# Patient Record
Sex: Male | Born: 1960 | Race: Black or African American | Hispanic: No | Marital: Single | Smoking: Current every day smoker
Health system: Southern US, Community
[De-identification: ages and names within clinical notes are randomized; demographics above are authoritative.]

## PROBLEM LIST (undated history)

## (undated) DIAGNOSIS — I96 Gangrene, not elsewhere classified: Secondary | ICD-10-CM

## (undated) DIAGNOSIS — I70262 Atherosclerosis of native arteries of extremities with gangrene, left leg: Secondary | ICD-10-CM

## (undated) DIAGNOSIS — G709 Myoneural disorder, unspecified: Secondary | ICD-10-CM

## (undated) DIAGNOSIS — Z992 Dependence on renal dialysis: Secondary | ICD-10-CM

## (undated) DIAGNOSIS — I739 Peripheral vascular disease, unspecified: Secondary | ICD-10-CM

## (undated) DIAGNOSIS — J189 Pneumonia, unspecified organism: Secondary | ICD-10-CM

## (undated) DIAGNOSIS — I5020 Unspecified systolic (congestive) heart failure: Secondary | ICD-10-CM

## (undated) DIAGNOSIS — K661 Hemoperitoneum: Secondary | ICD-10-CM

## (undated) DIAGNOSIS — I10 Essential (primary) hypertension: Secondary | ICD-10-CM

## (undated) DIAGNOSIS — M869 Osteomyelitis, unspecified: Secondary | ICD-10-CM

## (undated) DIAGNOSIS — Z95820 Peripheral vascular angioplasty status with implants and grafts: Secondary | ICD-10-CM

## (undated) DIAGNOSIS — N186 End stage renal disease: Secondary | ICD-10-CM

## (undated) DIAGNOSIS — K219 Gastro-esophageal reflux disease without esophagitis: Secondary | ICD-10-CM

## (undated) DIAGNOSIS — I998 Other disorder of circulatory system: Secondary | ICD-10-CM

## (undated) DIAGNOSIS — I509 Heart failure, unspecified: Secondary | ICD-10-CM

## (undated) DIAGNOSIS — E1151 Type 2 diabetes mellitus with diabetic peripheral angiopathy without gangrene: Secondary | ICD-10-CM

## (undated) DIAGNOSIS — K683 Retroperitoneal hematoma: Secondary | ICD-10-CM

## (undated) DIAGNOSIS — R0602 Shortness of breath: Secondary | ICD-10-CM

## (undated) DIAGNOSIS — E785 Hyperlipidemia, unspecified: Secondary | ICD-10-CM

## (undated) DIAGNOSIS — E119 Type 2 diabetes mellitus without complications: Secondary | ICD-10-CM

## (undated) HISTORY — PX: KNEE ARTHROSCOPY: SUR90

## (undated) HISTORY — PX: SKIN GRAFT: SHX250

## (undated) HISTORY — DX: Essential (primary) hypertension: I10

## (undated) HISTORY — DX: Osteomyelitis, unspecified: M86.9

## (undated) HISTORY — DX: Type 2 diabetes mellitus with diabetic peripheral angiopathy without gangrene: E11.51

## (undated) HISTORY — DX: Atherosclerosis of native arteries of extremities with gangrene, left leg: I70.262

## (undated) HISTORY — DX: Unspecified systolic (congestive) heart failure: I50.20

## (undated) HISTORY — DX: End stage renal disease: Z99.2

## (undated) HISTORY — DX: End stage renal disease: N18.6

## (undated) HISTORY — DX: Shortness of breath: R06.02

## (undated) HISTORY — DX: Hyperlipidemia, unspecified: E78.5

---
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
## 1999-06-17 ENCOUNTER — Emergency Department (HOSPITAL_COMMUNITY): Admission: EM | Admit: 1999-06-17 | Discharge: 1999-06-17 | Payer: Self-pay | Admitting: Emergency Medicine

## 1999-06-17 ENCOUNTER — Encounter: Payer: Self-pay | Admitting: Emergency Medicine

## 1999-08-02 ENCOUNTER — Emergency Department (HOSPITAL_COMMUNITY): Admission: EM | Admit: 1999-08-02 | Discharge: 1999-08-02 | Payer: Self-pay | Admitting: Emergency Medicine

## 1999-08-03 ENCOUNTER — Encounter: Payer: Self-pay | Admitting: Emergency Medicine

## 2000-01-17 ENCOUNTER — Emergency Department (HOSPITAL_COMMUNITY): Admission: EM | Admit: 2000-01-17 | Discharge: 2000-01-17 | Payer: Self-pay | Admitting: *Deleted

## 2000-01-17 ENCOUNTER — Encounter: Payer: Self-pay | Admitting: Emergency Medicine

## 2008-01-05 DIAGNOSIS — J189 Pneumonia, unspecified organism: Secondary | ICD-10-CM

## 2008-01-05 DIAGNOSIS — M869 Osteomyelitis, unspecified: Secondary | ICD-10-CM

## 2008-01-05 HISTORY — DX: Pneumonia, unspecified organism: J18.9

## 2008-01-05 HISTORY — DX: Osteomyelitis, unspecified: M86.9

## 2008-02-05 HISTORY — PX: TOE AMPUTATION: SHX809

## 2008-02-26 ENCOUNTER — Inpatient Hospital Stay (HOSPITAL_COMMUNITY): Admission: EM | Admit: 2008-02-26 | Discharge: 2008-03-01 | Payer: Self-pay | Admitting: Emergency Medicine

## 2008-02-27 ENCOUNTER — Ambulatory Visit: Payer: Self-pay | Admitting: Surgery

## 2008-02-27 ENCOUNTER — Encounter (INDEPENDENT_AMBULATORY_CARE_PROVIDER_SITE_OTHER): Payer: Self-pay | Admitting: Internal Medicine

## 2008-02-28 ENCOUNTER — Encounter (INDEPENDENT_AMBULATORY_CARE_PROVIDER_SITE_OTHER): Payer: Self-pay | Admitting: Orthopedic Surgery

## 2008-02-29 ENCOUNTER — Ambulatory Visit: Payer: Self-pay | Admitting: Infectious Diseases

## 2008-03-06 DIAGNOSIS — I10 Essential (primary) hypertension: Secondary | ICD-10-CM | POA: Insufficient documentation

## 2008-03-06 DIAGNOSIS — E119 Type 2 diabetes mellitus without complications: Secondary | ICD-10-CM | POA: Insufficient documentation

## 2008-03-06 DIAGNOSIS — E118 Type 2 diabetes mellitus with unspecified complications: Secondary | ICD-10-CM

## 2008-03-06 DIAGNOSIS — E1165 Type 2 diabetes mellitus with hyperglycemia: Secondary | ICD-10-CM | POA: Insufficient documentation

## 2008-03-06 DIAGNOSIS — E785 Hyperlipidemia, unspecified: Secondary | ICD-10-CM | POA: Insufficient documentation

## 2008-03-15 ENCOUNTER — Encounter (HOSPITAL_BASED_OUTPATIENT_CLINIC_OR_DEPARTMENT_OTHER): Admission: RE | Admit: 2008-03-15 | Discharge: 2008-06-13 | Payer: Self-pay | Admitting: General Surgery

## 2008-03-29 ENCOUNTER — Encounter: Payer: Self-pay | Admitting: Infectious Diseases

## 2008-04-03 ENCOUNTER — Encounter: Payer: Self-pay | Admitting: Infectious Diseases

## 2008-04-04 ENCOUNTER — Telehealth (INDEPENDENT_AMBULATORY_CARE_PROVIDER_SITE_OTHER): Payer: Self-pay | Admitting: *Deleted

## 2008-04-04 ENCOUNTER — Ambulatory Visit: Payer: Self-pay | Admitting: Infectious Diseases

## 2008-05-07 ENCOUNTER — Encounter: Payer: Self-pay | Admitting: Infectious Diseases

## 2008-05-13 ENCOUNTER — Ambulatory Visit: Payer: Self-pay | Admitting: *Deleted

## 2008-05-14 ENCOUNTER — Encounter: Payer: Self-pay | Admitting: Internal Medicine

## 2008-05-14 ENCOUNTER — Ambulatory Visit: Payer: Self-pay | Admitting: Internal Medicine

## 2008-05-14 LAB — CONVERTED CEMR LAB
Creatinine, Ser: 0.95 mg/dL (ref 0.40–1.50)
GFR calc Af Amer: >60 mL/min (ref >60)
GFR calc non Af Amer: >60 mL/min (ref >60)
Sodium: 135 meq/L (ref 135–145)

## 2008-05-23 ENCOUNTER — Ambulatory Visit: Payer: Self-pay | Admitting: Infectious Diseases

## 2008-06-05 ENCOUNTER — Ambulatory Visit: Payer: Self-pay | Admitting: *Deleted

## 2008-06-05 ENCOUNTER — Inpatient Hospital Stay (HOSPITAL_COMMUNITY): Admission: AD | Admit: 2008-06-05 | Discharge: 2008-06-06 | Payer: Self-pay | Admitting: *Deleted

## 2008-06-05 ENCOUNTER — Ambulatory Visit: Payer: Self-pay | Admitting: Internal Medicine

## 2008-06-05 LAB — CONVERTED CEMR LAB: Blood Glucose, Fingerstick: 240

## 2008-06-06 ENCOUNTER — Encounter (INDEPENDENT_AMBULATORY_CARE_PROVIDER_SITE_OTHER): Payer: Self-pay | Admitting: *Deleted

## 2008-06-06 ENCOUNTER — Ambulatory Visit: Payer: Self-pay | Admitting: Vascular Surgery

## 2008-06-06 ENCOUNTER — Encounter (INDEPENDENT_AMBULATORY_CARE_PROVIDER_SITE_OTHER): Payer: Self-pay | Admitting: Internal Medicine

## 2008-06-08 ENCOUNTER — Telehealth: Payer: Self-pay | Admitting: Internal Medicine

## 2008-06-12 ENCOUNTER — Encounter (INDEPENDENT_AMBULATORY_CARE_PROVIDER_SITE_OTHER): Payer: Self-pay | Admitting: Internal Medicine

## 2008-06-12 ENCOUNTER — Telehealth (INDEPENDENT_AMBULATORY_CARE_PROVIDER_SITE_OTHER): Payer: Self-pay | Admitting: Internal Medicine

## 2008-06-13 ENCOUNTER — Encounter (HOSPITAL_BASED_OUTPATIENT_CLINIC_OR_DEPARTMENT_OTHER): Admission: RE | Admit: 2008-06-13 | Discharge: 2008-09-11 | Payer: Self-pay | Admitting: General Surgery

## 2008-06-13 ENCOUNTER — Encounter: Payer: Self-pay | Admitting: Internal Medicine

## 2008-06-20 ENCOUNTER — Encounter: Payer: Self-pay | Admitting: Infectious Diseases

## 2008-07-11 ENCOUNTER — Ambulatory Visit: Payer: Self-pay | Admitting: Internal Medicine

## 2008-07-12 ENCOUNTER — Ambulatory Visit: Payer: Self-pay | Admitting: Internal Medicine

## 2008-07-12 ENCOUNTER — Encounter (INDEPENDENT_AMBULATORY_CARE_PROVIDER_SITE_OTHER): Payer: Self-pay | Admitting: Internal Medicine

## 2008-07-12 DIAGNOSIS — H538 Other visual disturbances: Secondary | ICD-10-CM | POA: Insufficient documentation

## 2008-07-12 LAB — CONVERTED CEMR LAB: Blood Glucose, Fingerstick: 206

## 2008-07-13 ENCOUNTER — Encounter (INDEPENDENT_AMBULATORY_CARE_PROVIDER_SITE_OTHER): Payer: Self-pay | Admitting: Internal Medicine

## 2008-07-17 ENCOUNTER — Encounter: Payer: Self-pay | Admitting: Internal Medicine

## 2008-07-17 ENCOUNTER — Ambulatory Visit: Payer: Self-pay | Admitting: Infectious Diseases

## 2008-07-17 LAB — CONVERTED CEMR LAB
BUN: 20 mg/dL (ref 6–23)
Basophils Absolute: 0 10*3/uL (ref 0.0–0.1)
Basophils Relative: 0 % (ref 0–1)
Calcium: 10.2 mg/dL (ref 8.4–10.5)
Calcium: 9.8 mg/dL (ref 8.4–10.5)
Chloride: 103 meq/L (ref 96–112)
Creatinine, Ser: 0.92 mg/dL (ref 0.40–1.50)
Eosinophils Absolute: 0 10*3/uL (ref 0.0–0.7)
Eosinophils Relative: 0 % (ref 0–5)
Glucose, Bld: 136 mg/dL — ABNORMAL HIGH (ref 70–99)
HCT: 38.2 % — ABNORMAL LOW (ref 39.0–52.0)
Lymphocytes Relative: 26 % (ref 12–46)
Lymphs Abs: 2.2 10*3/uL (ref 0.7–4.0)
Monocytes Absolute: 0.6 10*3/uL (ref 0.1–1.0)
Monocytes Relative: 7 % (ref 3–12)
Neutro Abs: 5.9 10*3/uL (ref 1.7–7.7)
Neutrophils Relative %: 67 % (ref 43–77)
RBC: 4.4 M/uL (ref 4.22–5.81)
WBC: 8.7 10*3/uL (ref 4.0–10.5)

## 2008-07-29 ENCOUNTER — Encounter: Payer: Self-pay | Admitting: Infectious Diseases

## 2008-09-11 ENCOUNTER — Encounter (HOSPITAL_BASED_OUTPATIENT_CLINIC_OR_DEPARTMENT_OTHER): Admission: RE | Admit: 2008-09-11 | Discharge: 2008-12-05 | Payer: Self-pay | Admitting: Internal Medicine

## 2009-03-20 ENCOUNTER — Inpatient Hospital Stay (HOSPITAL_COMMUNITY): Admission: EM | Admit: 2009-03-20 | Discharge: 2009-03-25 | Payer: Self-pay | Admitting: Emergency Medicine

## 2009-03-20 ENCOUNTER — Ambulatory Visit: Payer: Self-pay | Admitting: Cardiology

## 2009-03-20 ENCOUNTER — Ambulatory Visit: Payer: Self-pay | Admitting: Internal Medicine

## 2009-03-21 ENCOUNTER — Encounter: Payer: Self-pay | Admitting: Internal Medicine

## 2009-03-23 ENCOUNTER — Encounter: Payer: Self-pay | Admitting: Internal Medicine

## 2009-04-10 ENCOUNTER — Ambulatory Visit: Payer: Self-pay | Admitting: Internal Medicine

## 2009-04-10 ENCOUNTER — Telehealth (INDEPENDENT_AMBULATORY_CARE_PROVIDER_SITE_OTHER): Payer: Self-pay | Admitting: *Deleted

## 2009-04-14 ENCOUNTER — Encounter: Payer: Self-pay | Admitting: Internal Medicine

## 2009-07-28 ENCOUNTER — Ambulatory Visit: Payer: Self-pay | Admitting: Internal Medicine

## 2009-07-28 ENCOUNTER — Encounter: Payer: Self-pay | Admitting: Internal Medicine

## 2009-07-28 ENCOUNTER — Encounter (INDEPENDENT_AMBULATORY_CARE_PROVIDER_SITE_OTHER): Payer: Self-pay | Admitting: *Deleted

## 2009-07-28 ENCOUNTER — Observation Stay (HOSPITAL_COMMUNITY): Admission: RE | Admit: 2009-07-28 | Discharge: 2009-07-29 | Payer: Self-pay | Admitting: Internal Medicine

## 2009-07-28 LAB — CONVERTED CEMR LAB
HDL: 26 mg/dL
Hgb A1c MFr Bld: 13 %
Triglycerides: 157 mg/dL

## 2009-07-28 LAB — HM DIABETES FOOT EXAM

## 2009-07-29 ENCOUNTER — Encounter: Payer: Self-pay | Admitting: Internal Medicine

## 2009-07-29 ENCOUNTER — Encounter (INDEPENDENT_AMBULATORY_CARE_PROVIDER_SITE_OTHER): Payer: Self-pay | Admitting: *Deleted

## 2009-08-04 ENCOUNTER — Encounter (HOSPITAL_BASED_OUTPATIENT_CLINIC_OR_DEPARTMENT_OTHER): Admission: RE | Admit: 2009-08-04 | Discharge: 2009-10-31 | Payer: Self-pay | Admitting: General Surgery

## 2009-08-04 ENCOUNTER — Telehealth: Payer: Self-pay | Admitting: Internal Medicine

## 2009-08-04 ENCOUNTER — Encounter: Payer: Self-pay | Admitting: Licensed Clinical Social Worker

## 2009-08-08 ENCOUNTER — Ambulatory Visit (HOSPITAL_COMMUNITY): Admission: RE | Admit: 2009-08-08 | Discharge: 2009-08-08 | Payer: Self-pay | Admitting: General Surgery

## 2009-08-13 ENCOUNTER — Telehealth: Payer: Self-pay | Admitting: *Deleted

## 2009-09-11 ENCOUNTER — Encounter: Payer: Self-pay | Admitting: Internal Medicine

## 2009-11-05 ENCOUNTER — Encounter (HOSPITAL_BASED_OUTPATIENT_CLINIC_OR_DEPARTMENT_OTHER)
Admission: RE | Admit: 2009-11-05 | Discharge: 2010-02-03 | Payer: Self-pay | Source: Home / Self Care | Attending: General Surgery | Admitting: General Surgery

## 2010-02-03 NOTE — Progress Notes (Signed)
Summary: phone/gg  Phone Note Call from Patient   Caller: Patient Summary of Call: Pt called and states [employer] [employer] would be calling us about his short term disability.  He gives his permission to talk with them. Initial call taken by: Gevena Cotton RN,  August 13, 2009 3:44 PM

## 2010-02-03 NOTE — Miscellaneous (Signed)
Summary: [employer]  [employer]   Imported By: Garlan Fillers 09/18/2009 11:03:47  _____________________________________________________________________  External Attachment:    Type:   Image     Comment:   External Document  Appended Document: [employer]/ Will fax them the required information.

## 2010-02-03 NOTE — Letter (Signed)
Summary: [facility]  [facility]   Imported By: Garlan Fillers 04/14/2009 10:01:13  _____________________________________________________________________  External Attachment:    Type:   Image     Comment:   External Document

## 2010-02-03 NOTE — Assessment & Plan Note (Signed)
Summary: ACUTE/YANG/LEFT FOOT PAIN/CH   Vital Signs:  Patient profile:   50 year old male Height:      72 inches Weight:      181.5 pounds BMI:     24.70 Temp:     99.0 degrees F oral Pulse rate:   108 / minute BP sitting:   153 / 87  (right arm)  Vitals Entered By: Silverio Decamp NT II (July 28, 2009 10:47 AM) CC: Foot pain has hole  Is Patient Diabetic? Yes Did you bring your meter with you today? Yes Pain Assessment Patient in pain? yes     Location: Left foot  Intensity: 3 Type: aching Onset of pain  Last week Nutritional Status BMI of 19 -24 = normal  Have you ever been in a relationship where you felt threatened, hurt or afraid?No   Does patient need assistance? Functional Status Self care Ambulation Normal   Diabetic Foot Exam Foot Inspection Is there a history of a foot ulcer?              Yes Is there a foot ulcer now?              Yes Can the patient see the bottom of their feet?          Yes Are the shoes appropriate in style and fit?          Yes Is there swelling or an abnormal foot shape?          No Are the toenails long?                Yes Are the toenails thick?                No Are the toenails ingrown?              No Is there heavy callous build-up?              Yes Is there pain in the calf muscle (Intermittent claudication) when walking?    NoIs there a claw toe deformity?              No Is there elevated skin temperature?            No Is there limited ankle dorsiflexion?            No Is there foot or ankle muscle weakness?            No  Diabetic Foot Care Education Patient educated on appropriate care of diabetic feet.  Pulse Check          Right Foot          Left Foot Posterior Tibial:        normal            normal Dorsalis Pedis:        normal            normal Comments: Heavy buildup of dry skin on right foot High Risk Feet? Yes Set Next Diabetic Foot Exam here: 10/28/2009   10-g (5.07) Semmes-Weinstein Monofilament  Test Performed by: Silverio Decamp NT II          Right Foot          Left Foot Site 1         normal         normal Site 2         normal  normal Site 3         normal         normal Site 4         normal         normal Site 5         normal         normal Site 6         normal         normal Site 7         normal         normal Site 8         normal         normal Site 9         normal         normal  Impression      normal         normal   Primary Care Provider:  Geanie Kenning MD  CC:  Foot pain has hole .  History of Present Illness: Patient is a 50 year old man who presents today with complaint of left foot pain.  He has a history of Left great toe amputation in 2/10 and previous history of left foot ulcers.  He states that he is on his feet a lot for work and has been having problems with the base of his amputated great toe.  There has been a small hole there which he has been putting "cream" on that he got from wound care and keeping it bandaged.  He noticed a blister last week and then Friday the blister broke and the foot swelled up. When he removed the bandage "the skin just fell off."  Once the skin came off the pain got better and the swelling decreased.  He denies fever, chills, nausea, vomiting, or diarrhea.  He has noticed that his blood sugars have been much higher then previously.    Preventive Screening-Counseling & Management  Alcohol-Tobacco     Alcohol drinks/day: occasioanlly     Smoking Status: quit     Year Quit: 2008     Pack years: 2/3   Caffeine-Diet-Exercise     Caffeine use/day: yes     Does Patient Exercise: yes     Type of exercise: walking     Exercise (avg: min/session): >60     Times/week: 7  Current Medications (verified): 1)  Aspirin Adult Low Strength 81 Mg Tbec (Aspirin) .... Take 1 Tablet By Mouth Once A Day 2)  Lantus 100 Unit/ml Soln (Insulin Glargine) .... Inject 30 Units Subcutaneously Hs or As Instructed 3)  Glucophage 500 Mg  Tabs (Metformin Hcl) .... Take 1 Tablet By Mouth Two Times A Day 4)  Lisinopril 40 Mg Tabs (Lisinopril) .... Take 1 Tablet By Mouth Once A Day 5)  Metoprolol Succinate 100 Mg Xr24h-Tab (Metoprolol Succinate) .... Take 1 Tablet By Mouth Once A Day 6)  Novolog Penfill 100 Unit/ml Soln (Insulin Aspart) .... Use Per Sliding Scale Protocol.call  Our Clinic With Any Questions. 7)  Pravastatin Sodium 40 Mg Tabs (Pravastatin Sodium) .... Take 1 Tablet By Mouth Once A Day 8)  Mucinex Dm 30-600 Mg Xr12h-Tab (Dextromethorphan-Guaifenesin) .... Take 1 Tablet By Mouth Two Times A Day As Needed For Cough and Congestion 9)  Freestyle Lite Test  Strp (Glucose Blood) .... Use To Checkblood Sugar 4x Daily 10)  Lancets  Misc (Lancets) .... Use To Check Blood Sugar 4x Daily- Please Disspense 30 Gauge or Higher  Per Patient Preference.  Allergies (verified): No Known Drug Allergies  Past History:  Past medical, surgical, family and social histories (including risk factors) reviewed for relevance to current acute and chronic problems.  Past Medical History: Reviewed history from 03/06/2008 and no changes required. Osteomyelitis, left great toe, SP amputation Diabetes mellitus, type II Hyperlipidemia Hypertension  Past Surgical History: Reviewed history from 07/11/2008 and no changes required. left great toe amputation due to osteomylitis in 02/2008.  Family History: Reviewed history from 05/13/2008 and no changes required.  Mother and father living and well.  One sister and one brother, both living and well.    Social History: Reviewed history from 05/13/2008 and no changes required. Former Research scientist (life sciences) and quitted in 2008. Alcohol use-occasional Drug use-no Regular exercise-yes  Review of Systems      See HPI  Physical Exam  Additional Exam:  General: Well developed, male in no acute distress  Resp: Clear to ascultation bilaterally, no wheezes, rales, or rhonchi noted  CV: Regular rate and rhythm  with no murmurs, rubs, or gallops noted  Foot:  right foot has no erythema or warmth and the skin is intact.  Left foot is swollen compared to the right and is darker in appearance to above the ankle.  It is warm to the touch to that level as well.  The base of the left great toe ampuation has an open sore that extends the full thickness.  There is exposed tissue that appears to be tendon.  I don't feel any bone at the base of the ulcer.  It is approximately 2 inches in diameter.  There is no purulent drainage or odor at this time. It is not painful to palpation.    Pulses: Dorsal pedis and posterior tibial pulses +2 and symmetric  Diabetes Management Exam:    Foot Exam (with socks and/or shoes not present):       Sensory-Monofilament:          Left foot: normal          Right foot: normal  Report called to Nurse on 5000.  Pt transported via wheelchair to [id number]. Sander Nephew, RN July 28, 2009 1:35 PM .  Impression & Recommendations:  Problem # 1:  DIABETIC FOOT ULCER, LEFT (ICD-707.14)  The ulcer is over a point of pressure in his foot.  There is swelling and warmth which concerns me for cellulitis associated with the ulceration.  We will go ahead and admit him for IV antibiotic therapy and get him organized with wound care to help this wound heal.  He is hemodynamically stable at this time.  Patient is discussed with Dr. Wendee Beavers who is admitting for Dr. Caroline More service and he would like an X-ray of the foot to rule out osteomyelitis.  Orders: T-DG Foot Complete*L* [id number])  Problem # 2:  CELLULITIS, FOOT, LEFT (ICD-682.7)  Associated with the diabetic foot ulcer on the left foot.  Orders: T-DG Foot Complete*L* [id number])  Complete Medication List: 1)  Aspirin Adult Low Strength 81 Mg Tbec (Aspirin) .... Take 1 tablet by mouth once a day 2)  Lantus 100 Unit/ml Soln (Insulin glargine) .... Inject 30 units subcutaneously hs or as instructed 3)  Glucophage 500 Mg Tabs (Metformin hcl) .... Take  1 tablet by mouth two times a day 4)  Lisinopril 40 Mg Tabs (Lisinopril) .... Take 1 tablet by mouth once a day 5)  Metoprolol Succinate 100 Mg Xr24h-tab (Metoprolol succinate) .... Take 1 tablet by mouth once a day  6)  Novolog Penfill 100 Unit/ml Soln (Insulin aspart) .... Use per sliding scale protocol.call  our clinic with any questions. 7)  Pravastatin Sodium 40 Mg Tabs (Pravastatin sodium) .... Take 1 tablet by mouth once a day 8)  Mucinex Dm 30-600 Mg Xr12h-tab (Dextromethorphan-guaifenesin) .... Take 1 tablet by mouth two times a day as needed for cough and congestion 9)  Freestyle Lite Test Strp (Glucose blood) .... Use to checkblood sugar 4x daily 10)  Lancets Misc (Lancets) .... Use to check blood sugar 4x daily- please disspense 30 gauge or higher per patient preference.  Other Orders: T-Hgb A1C (in-house) [phone]) T- Capillary Blood Glucose [id number])  Prevention & Chronic Care Immunizations   Influenza vaccine: Not documented   Influenza vaccine due: 09/04/2008    Tetanus booster: Not documented   Td booster deferral: Deferred  (07/11/2008)    Pneumococcal vaccine: Not documented  Other Screening   PSA: Not documented   Smoking status: quit  (07/28/2009)  Diabetes Mellitus   HgbA1C: 13.0  (07/28/2009)   HgbA1C action/deferral: Ordered  (07/28/2009)    Eye exam: Not documented   Diabetic eye exam action/deferral: Ophthalmology referral  (04/10/2009)    Foot exam: yes  (07/28/2009)   Foot exam action/deferral: Do today   High risk foot: Yes  (07/28/2009)   Foot care education: Done  (07/28/2009)   Foot exam due: 10/28/2009    Urine microalbumin/creatinine ratio: Not documented   Urine microalbumin action/deferral: Ordered  Lipids   Total Cholesterol: Not documented   Lipid panel action/deferral: Deferred   LDL: Not documented   LDL Direct: Not documented   HDL: Not documented   Triglycerides: Not documented    SGOT (AST): 12  (05/14/2008)   SGPT (ALT):  22  (05/14/2008)   Alkaline phosphatase: 68  (05/14/2008)   Total bilirubin: 0.4  (05/14/2008)  Hypertension   Last Blood Pressure: 153 / 87  (07/28/2009)   Serum creatinine: 0.92  (07/17/2008)   Serum potassium 4.3  (07/17/2008)  Self-Management Support :   Personal Goals (by the next clinic visit) :     Personal A1C goal: 7  (04/10/2009)     Personal blood pressure goal: 130/80  (04/10/2009)     Personal LDL goal: 100  (04/10/2009)    Patient will work on the following items until the next clinic visit to reach self-care goals:     Medications and monitoring: take my medicines every day, check my blood sugar  (07/28/2009)     Eating: eat more vegetables, use fresh or frozen vegetables, eat foods that are low in salt, eat baked foods instead of fried foods  (07/28/2009)     Activity: take a 30 minute walk every day  (07/28/2009)    Diabetes self-management support: Written self-care plan  (04/10/2009)    Hypertension self-management support: Written self-care plan  (04/10/2009)    Lipid self-management support: Written self-care plan  (04/10/2009)    Nursing Instructions: HgbA1C today (see order) Diabetic foot exam today    Laboratory Results   Blood Tests     HGBA1C: 13.0%   (Normal Range: Non-Diabetic - 3-6%   Control Diabetic - 6-8%) CBG Fasting:: 295mg /dL

## 2010-02-03 NOTE — Assessment & Plan Note (Signed)
Summary: EST-CK/FU/MEDS/CFB   Vital Signs:  Patient profile:   50 year old male Height:      72 inches Weight:      185.8 pounds BMI:     25.29 Temp:     98.2 degrees F oral Pulse rate:   88 / minute BP sitting:   130 / 84  (right arm)  Vitals Entered By: Silverio Decamp NT II (April 10, 2009 2:18 PM) CC: HFU Is Patient Diabetic? Yes Did you bring your meter with you today? No Pain Assessment Patient in pain? yes     Location: FEET Intensity: 5 Type: aching Onset of pain  Chronic  Does patient need assistance? Functional Status Self care Ambulation Normal   Primary Care Provider:  Geanie Kenning MD  CC:  HFU.  History of Present Illness: Pt is a 50 yo [name] with PMH of DM type 2, HTN and HLD came here for recent hospitalization for bilateral PNA. After discharge for 3 weeks he has been doing well except occasional non-cough, no fever, chill, SOB or CP. He has good appetite and has been back to work. He has not checked his cbg after discharge as he lost his glucometer, but he said he has been got lantus 40 units and other medications as instructed. Denies smoking, ETOH and drug abuse.  Preventive Screening-Counseling & Management  Alcohol-Tobacco     Alcohol drinks/day: occasioanlly     Smoking Status: quit     Year Quit: 2008     Pack years: 2/3   Caffeine-Diet-Exercise     Caffeine use/day: yes     Does Patient Exercise: yes     Type of exercise: walking     Exercise (avg: min/session): >60     Times/week: 7  Problems Prior to Update: 1)  Blurred Vision  (ICD-368.8) 2)  Hypotension, Orthostatic  (ICD-458.0) 3)  Tachycardia  (ICD-785.0) 4)  Cellulitis, Foot, Left  (ICD-682.7) 5)  Acute Osteomyelitis Other Specified Site  (ICD-730.08) 6)  Hypertension  (ICD-401.9) 7)  Hyperlipidemia  (ICD-272.4) 8)  Diabetes Mellitus, Type II  (ICD-250.00)  Medications Prior to Update: 1)  Aspirin Adult Low Strength 81 Mg Tbec (Aspirin) .... Take 1 Tablet By Mouth Once A  Day 2)  Lantus 100 Unit/ml Soln (Insulin Glargine) .... Inject 30 Units Subcutaneously Hs or As Instructed 3)  Glucophage 500 Mg Tabs (Metformin Hcl) .... Take 1 Tablet By Mouth Two Times A Day 4)  Lisinopril 40 Mg Tabs (Lisinopril) .... Take 1 Tablet By Mouth Once A Day 5)  Metoprolol Succinate 100 Mg Xr24h-Tab (Metoprolol Succinate) .... Take 1 Tablet By Mouth Once A Day 6)  Novolog Penfill 100 Unit/ml Soln (Insulin Aspart) .... Use Per Sliding Scale Protocol.call  Our Clinic With Any Questions. 7)  Avelox 400 Mg Tabs (Moxifloxacin Hcl) .... Take 1 Tablet By Mouth Once A Day For 5 Days  Current Medications (verified): 1)  Aspirin Adult Low Strength 81 Mg Tbec (Aspirin) .... Take 1 Tablet By Mouth Once A Day 2)  Lantus 100 Unit/ml Soln (Insulin Glargine) .... Inject 30 Units Subcutaneously Hs or As Instructed 3)  Glucophage 500 Mg Tabs (Metformin Hcl) .... Take 1 Tablet By Mouth Two Times A Day 4)  Lisinopril 40 Mg Tabs (Lisinopril) .... Take 1 Tablet By Mouth Once A Day 5)  Metoprolol Succinate 100 Mg Xr24h-Tab (Metoprolol Succinate) .... Take 1 Tablet By Mouth Once A Day 6)  Novolog Penfill 100 Unit/ml Soln (Insulin Aspart) .... Use Per Sliding Scale  Protocol.call  Our Clinic With Any Questions. 7)  Avelox 400 Mg Tabs (Moxifloxacin Hcl) .... Take 1 Tablet By Mouth Once A Day For 5 Days  Allergies (verified): No Known Drug Allergies  Past History:  Past Medical History: Last updated: 03/06/2008 Osteomyelitis, left great toe, SP amputation Diabetes mellitus, type II Hyperlipidemia Hypertension  Past Surgical History: Last updated: 07/11/2008 left great toe amputation due to osteomylitis in 02/2008.  Family History: Last updated: 05/13/2008  Mother and father living and well.  One sister and one brother, both living and well.    Social History: Last updated: 05/13/2008 Former Smoker and quitted in 2008. Alcohol use-occasional Drug use-no Regular exercise-yes  Risk  Factors: Alcohol Use: occasioanlly (04/10/2009) Caffeine Use: yes (04/10/2009) Exercise: yes (04/10/2009)  Risk Factors: Smoking Status: quit (04/10/2009)  Family History: Reviewed history from 05/13/2008 and no changes required.  Mother and father living and well.  One sister and one brother, both living and well.    Social History: Reviewed history from 05/13/2008 and no changes required. Former Research scientist (life sciences) and quitted in 2008. Alcohol use-occasional Drug use-no Regular exercise-yes  Review of Systems       The patient complains of prolonged cough.  The patient denies fever, decreased hearing, chest pain, syncope, dyspnea on exertion, headaches, hemoptysis, abdominal pain, and unusual weight change.    Physical Exam  General:  alert, well-developed, well-nourished, and well-hydrated.   Head:  normocephalic.   Eyes:  vision grossly intact, pupils equal, pupils round, and pupils reactive to light.   Ears:  no external deformities.   Nose:  no nasal discharge.   Mouth:  pharynx pink and moist.   Neck:  supple.   Lungs:  normal respiratory effort, normal breath sounds, no crackles, and no wheezes.   Heart:  normal rate, regular rhythm, no murmur, no gallop, and no JVD.   Abdomen:  soft, non-tender, normal bowel sounds, no distention, and no masses.   Msk:  normal ROM.   Pulses:  2+ Extremities:  No edema and his left foot uler healed well. Neurologic:  alert & oriented X3, cranial nerves II-XII intact, strength normal in all extremities, sensation intact to light touch, and gait normal.     Impression & Recommendations:  Problem # 1:  PNEUMONIA, BILATERAL (ICD-486) Assessment Improved He has no fever, chill except occasional cough and mild congestion after discharge, has been back to work. At this time, no ABx is needed and will give mucinex for cough and congestion.  The following medications were removed from the medication list:    Avelox 400 Mg Tabs (Moxifloxacin hcl)  .[name]... Take 1 tablet by mouth once a day for 5 days  Problem # 2:  DIABETES MELLITUS, TYPE II (ICD-250.00) Assessment: Deteriorated  He said he has been taking his meds as instructed, but did not check CBG regularly as he lost his glucometer. Will have check CBG and give him a new glucometer. at the same time, Ms. Barnabas Harries will see him today for diabetic education. I am not sure if he has been taking meds as he has non-compliance before and his recent A1C 10.5, worse than before. Will have eye exam referral as he has never done this over a year.  His updated medication list for this problem includes:    Aspirin Adult Low Strength 81 Mg Tbec (Aspirin) .[name]... Take 1 tablet by mouth once a day    Lantus 100 Unit/ml Soln (Insulin glargine) ..... Inject 30 units subcutaneously hs or as  instructed    Glucophage 500 Mg Tabs (Metformin hcl) .[name]... Take 1 tablet by mouth two times a day    Lisinopril 40 Mg Tabs (Lisinopril) .[name]... Take 1 tablet by mouth once a day    Novolog Penfill 100 Unit/ml Soln (Insulin aspart) ..... Use per sliding scale protocol.call  our clinic with any questions.  Orders: Ophthalmology Referral (Ophthalmology) Diabetic Clinic Referral (Diabetic)  Labs Reviewed: Creat: 0.92 (07/17/2008)    Reviewed HgBA1c results: 9.3 (05/13/2008)  Orders: Diabetic Clinic Referral (Diabetic) Ophthalmology Referral (Ophthalmology)  Problem # 3:  HYPERTENSION (ICD-401.9) Assessment: Improved His BP at the goal and will continue the current meds.  His updated medication list for this problem includes:    Lisinopril 40 Mg Tabs (Lisinopril) .[name]... Take 1 tablet by mouth once a day    Metoprolol Succinate 100 Mg Xr24h-tab (Metoprolol succinate) .[name]... Take 1 tablet by mouth once a day  BP today: 130/84 Prior BP: 145/78 (07/12/2008)  Labs Reviewed: K+: 4.3 (07/17/2008) Creat: : 0.92 (07/17/2008)     Problem # 4:  HYPERLIPIDEMIA (ICD-272.4) Assessment: Unchanged His recent hospitalized   LDL 129 and HDL 29, will start statin and advised to continue exercise and healthy diet.  Labs Reviewed: SGOT: 12 (05/14/2008)   SGPT: 22 (05/14/2008)  His updated medication list for this problem includes:    Pravastatin Sodium 40 Mg Tabs (Pravastatin sodium) .[name]... Take 1 tablet by mouth once a day  Complete Medication List: 1)  Aspirin Adult Low Strength 81 Mg Tbec (Aspirin) .... Take 1 tablet by mouth once a day 2)  Lantus 100 Unit/ml Soln (Insulin glargine) .... Inject 30 units subcutaneously hs or as instructed 3)  Glucophage 500 Mg Tabs (Metformin hcl) .... Take 1 tablet by mouth two times a day 4)  Lisinopril 40 Mg Tabs (Lisinopril) .... Take 1 tablet by mouth once a day 5)  Metoprolol Succinate 100 Mg Xr24h-tab (Metoprolol succinate) .... Take 1 tablet by mouth once a day 6)  Novolog Penfill 100 Unit/ml Soln (Insulin aspart) .... Use per sliding scale protocol.call  our clinic with any questions. 7)  Pravastatin Sodium 40 Mg Tabs (Pravastatin sodium) .... Take 1 tablet by mouth once a day 8)  Mucinex Dm 30-600 Mg Xr12h-tab (Dextromethorphan-guaifenesin) .... Take 1 tablet by mouth two times a day as needed for cough and congestion  Patient Instructions: 1)  Please schedule a follow-up appointment in 3 months. 2)  Check your blood sugars regularly. If your readings are usually above : or below 70 you should contact our office. 3)  See your eye doctor yearly to check for diabetic eye damage. 4)  Check your feet each night for sore areas, calluses or signs of infection. Prescriptions: MUCINEX DM 30-600 MG XR12H-TAB (DEXTROMETHORPHAN-GUAIFENESIN) Take 1 tablet by mouth two times a day as needed for cough and congestion  #1 x 4   Entered and Authorized by:   Geanie Kenning MD   Signed by:   Geanie Kenning MD on 04/10/2009   Method used:   Print then Give to Patient   RxID:   [id number] PRAVASTATIN SODIUM 40 MG TABS (PRAVASTATIN SODIUM) Take 1 tablet by mouth once a day  #31 x  6   Entered and Authorized by:   Geanie Kenning MD   Signed by:   Geanie Kenning MD on 04/10/2009   Method used:   Print then Give to Patient   RxID:   [id number]    Prevention & Chronic Care Immunizations   Influenza vaccine:  Not documented   Influenza vaccine due: 09/04/2008    Tetanus booster: Not documented   Td booster deferral: Deferred  (07/11/2008)    Pneumococcal vaccine: Not documented  Other Screening   PSA: Not documented   Smoking status: quit  (04/10/2009)  Diabetes Mellitus   HgbA1C: 9.3  (05/13/2008)    Eye exam: Not documented   Diabetic eye exam action/deferral: Ophthalmology referral  (04/10/2009)    Foot exam: Not documented   Foot exam action/deferral: Do today   High risk foot: Yes  (07/11/2008)   Foot care education: Not documented   Foot exam due: 07/11/2009    Urine microalbumin/creatinine ratio: Not documented   Urine microalbumin action/deferral: Ordered    Diabetes flowsheet reviewed?: Yes   Progress toward A1C goal: At goal  Lipids   Total Cholesterol: Not documented   Lipid panel action/deferral: Deferred   LDL: Not documented   LDL Direct: Not documented   HDL: Not documented   Triglycerides: Not documented    SGOT (AST): 12  (05/14/2008)   SGPT (ALT): 22  (05/14/2008)   Alkaline phosphatase: 68  (05/14/2008)   Total bilirubin: 0.4  (05/14/2008)    Lipid flowsheet reviewed?: Yes   Progress toward LDL goal: Unchanged  Hypertension   Last Blood Pressure: 130 / 84  (04/10/2009)   Serum creatinine: 0.92  (07/17/2008)   Serum potassium 4.3  (07/17/2008)    Hypertension flowsheet reviewed?: No   Progress toward BP goal: At goal  Self-Management Support :   Personal Goals (by the next clinic visit) :     Personal A1C goal: 7  (04/10/2009)     Personal blood pressure goal: 130/80  (04/10/2009)     Personal LDL goal: 100  (04/10/2009)    Patient will work on the following items until the next clinic visit to reach  self-care goals:     Medications and monitoring: take my medicines every day, check my blood sugar  (04/10/2009)     Eating: drink diet soda or water instead of juice or soda, eat more vegetables  (04/10/2009)     Activity: take a 30 minute walk every day  (04/10/2009)    Diabetes self-management support: Written self-care plan  (04/10/2009)   Diabetes care plan printed   Referred for diabetes self-mgmt training.    Hypertension self-management support: Written self-care plan  (04/10/2009)   Hypertension self-care plan printed.    Lipid self-management support: Written self-care plan  (04/10/2009)   Lipid self-care plan printed.   Nursing Instructions: Refer for screening diabetic eye exam (see order)

## 2010-02-03 NOTE — Miscellaneous (Signed)
Summary: lipids from hospital/dmr  Clinical Lists Changes  Observations: Added new observation of LDL: 96 in hospital (07/28/2009 15:08) Added new observation of HDL: 26 in hospital (07/28/2009 15:08) Added new observation of TRIGLYC TOT: 157 in hospital (07/28/2009 15:08) Added new observation of CHOLESTEROL: 153 in hospital (07/28/2009 15:08)

## 2010-02-03 NOTE — Progress Notes (Signed)
Summary: diabetes testing supplies/mail order company/dmr  Phone Note Outgoing Call   Call placed by: Barnabas Harries RD,CDE,  April 11, 2009 11:28 AM Summary of Call: met with patient yesterday to assist him with meter slection- he chose a freestyle freedom lite meter and demonstrated ability to operate it. He also wanted to use [employer] for his diabetes supplies. Order form faxed to [employer] today on his behalf.     New/Updated Medications: FREESTYLE LITE TEST  STRP (GLUCOSE BLOOD) use to checkblood sugar 4x daily LANCETS  MISC (LANCETS) use to check blood sugar 4x daily- please disspense 30 gauge or higher per patient preference.

## 2010-02-03 NOTE — Discharge Summary (Signed)
Summary: Hospital Discharge Update    Hospital Discharge Update:  Date of Admission: 07/28/2009 Date of Discharge: 07/29/2009  Brief Summary:  Pt is a 50yo male with hx of uncontrolled DM, recent L great toe OM s/p amputation in 02/11, HTN, HLD who was a direct admit from [facility] for 1 week hx of worsening L foot diabetic pressure ulcer. Pt was started on Vancomycin,  XRay of left foot revealed no OM, wound cultures obtained indicating GPC, and wound care consulted. Pt received hydrotherapy during hospitalization and was arranged for wound care at [facility] wound care clinic starting August 2,2011. In interim, pt was set up for [facility] to administer home wound care until het can follow-up at [facility]. Pt discharged on Bactrim BDS x 13 additional days. Pt afebrile and with no leukocytosis during admission. Pt also noted to have very poorly controlled DM on admission and admits to noncompliance with medications. Metformin was increased to 1000mg  two times a day. Please reeval.  Lab or other results pending at discharge:  Final wound culture  Other labs needed at follow-up: CBG  Other follow-up issues:  Medication compliance, pt working 8-12 hours nonstop daily, forgetting to take pm doses. Consider a way to provide once daily meds?  Problem list changes:  Removed problem of PNEUMONIA, BILATERAL (ICD-486) Removed problem of HYPOTENSION, ORTHOSTATIC (ICD-458.0) Removed problem of CELLULITIS, FOOT, LEFT (ICD-682.7) Removed problem of ACUTE OSTEOMYELITIS OTHER SPECIFIED SITE (ICD-730.08) Assessed ACUTE OSTEOMYELITIS OTHER SPECIFIED SITE as improved  Medication list changes:  Changed medication from GLUCOPHAGE 500 MG TABS (METFORMIN HCL) Take 1 tablet by mouth two times a day to GLUCOPHAGE 1000 MG TABS (METFORMIN HCL) Take 1 tablet by mouth two times a day - Signed Removed medication of MUCINEX DM 30-600 MG XR12H-TAB (DEXTROMETHORPHAN-GUAIFENESIN) Take 1 tablet by mouth two times a day as needed  for cough and congestion Removed medication of LISINOPRIL 40 MG TABS (LISINOPRIL) Take 1 tablet by mouth once a day Added new medication of AMLODIPINE BESYLATE 10 MG TABS (AMLODIPINE BESYLATE) Take 1 tablet by mouth once a day - Signed Added new medication of BACTRIM DS 800-160 MG TABS (SULFAMETHOXAZOLE-TRIMETHOPRIM) Take 1 tablet by mouth two times a day - Signed Added new medication of TYLENOL ARTHRITIS PAIN 650 MG CR-TABS (ACETAMINOPHEN) one tab by mouth every 4-6 hours as needed for pain. - Signed Rx of GLUCOPHAGE 1000 MG TABS (METFORMIN HCL) Take 1 tablet by mouth two times a day;  #60 x 3;  Signed;  Entered by: Chilton Greathouse DO;  Authorized by: Chilton Greathouse DO;  Method used: Print then Give to Patient Rx of AMLODIPINE BESYLATE 10 MG TABS (AMLODIPINE BESYLATE) Take 1 tablet by mouth once a day;  #30 x 2;  Signed;  Entered by: Chilton Greathouse DO;  Authorized by: Chilton Greathouse DO;  Method used: Print then Give to Patient Rx of BACTRIM DS 800-160 MG TABS (SULFAMETHOXAZOLE-TRIMETHOPRIM) Take 1 tablet by mouth two times a day;  #26 x 0;  Signed;  Entered by: Chilton Greathouse DO;  Authorized by: Chilton Greathouse DO;  Method used: Print then Give to Patient Rx of BACTRIM DS 800-160 MG TABS (SULFAMETHOXAZOLE-TRIMETHOPRIM) Take 1 tablet by mouth two times a day;  #26 x 0;  Signed;  Entered by: Chilton Greathouse DO;  Authorized by: Chilton Greathouse DO;  Method used: Print then Give to Patient Rx of BACTRIM DS 800-160 MG TABS (SULFAMETHOXAZOLE-TRIMETHOPRIM) Take 1 tablet by mouth two times a day;  #26 x 0;  Signed;  Entered  by: Chilton Greathouse DO;  Authorized by: Chilton Greathouse DO;  Method used: Print then Give to Patient Rx of TYLENOL ARTHRITIS PAIN 650 MG CR-TABS (ACETAMINOPHEN) one tab by mouth every 4-6 hours as needed for pain.;  #60 x 0;  Signed;  Entered by: Chilton Greathouse DO;  Authorized by: Chilton Greathouse DO;  Method used:  Print then Give to Patient  Discharge medications:  ASPIRIN ADULT LOW STRENGTH 81 MG TBEC (ASPIRIN) Take 1 tablet by mouth once a day LANTUS 100 UNIT/ML SOLN (INSULIN GLARGINE) inject 30 units Subcutaneously hs or as instructed GLUCOPHAGE 1000 MG TABS (METFORMIN HCL) Take 1 tablet by mouth two times a day METOPROLOL SUCCINATE 100 MG XR24H-TAB (METOPROLOL SUCCINATE) Take 1 tablet by mouth once a day NOVOLOG PENFILL 100 UNIT/ML SOLN (INSULIN ASPART) Use per sliding scale protocol.Call  our clinic with any questions. PRAVASTATIN SODIUM 40 MG TABS (PRAVASTATIN SODIUM) Take 1 tablet by mouth once a day FREESTYLE LITE TEST  STRP (GLUCOSE BLOOD) use to checkblood sugar 4x daily LANCETS  MISC (LANCETS) use to check blood sugar 4x daily- please disspense 30 gauge or higher per patient preference. AMLODIPINE BESYLATE 10 MG TABS (AMLODIPINE BESYLATE) Take 1 tablet by mouth once a day BACTRIM DS 800-160 MG TABS (SULFAMETHOXAZOLE-TRIMETHOPRIM) Take 1 tablet by mouth two times a day TYLENOL ARTHRITIS PAIN 650 MG CR-TABS (ACETAMINOPHEN) one tab by mouth every 4-6 hours as needed for pain.  Other patient instructions:  1. Please follow-up with [facility] on August 15, 2009 at 11:00AM for hospital follow-up at which time, you may have blood work to complete to assess your diabetes control.  2. The [name] manager will arrange for a Home Health Nurse to come to your home and redress your wound from July 29, 2009 through August 05, 2009 (until you are able to follow-up at the [facility]). Contact information for [place] is  [phone].   3. Please follow-up with [facility] on August 05, 2009 at 1:00PM for wound care and hydrotherapy. You should arrive to the clinic at 12:15PM. Contact information [phone].  4. It is essential that you continue to take your blood pressure and diabetes medications as prescribed and taken  regularly to allow for better control of your disease processes. Please make all effort to take medications as prescribed.  5. You have been stopped from your Lisinopril because of the chronic cough that you reported, and have been started on a new medication for your blood pressure. If you continue to have elevated blood pressure on this new medication >180/100, please call the Internal Medicine Clinic [phone]) for furthe recommendations.  6. Your dose of Metformin has been increased because of your elevated sugars in the hospital. Please take regularly with you Lantus. If you have low blood sugars (<60s) and feel symptomatic, please call Internal Medicine Outpatient Clinic [phone]).  7. If you have any chest pain, difficulty breathing, severe foot pain with significant bleeding, please come back to the ER.  Note: Hospital Discharge Medications & Other Instructions handout was printed, one copy for patient and a second copy to be placed in hospital chart.

## 2010-02-03 NOTE — Initial Assessments (Signed)
Summary: Hospital Admission  INTERNAL MEDICINE ADMISSION HISTORY AND PHYSICAL   First Contact: Dr. Guy Sandifer [phone]) Second Contact: Dr. Eyvonne Mechanic [phone]) After Hours: 1st Contact [phone]), 2nd Contact [phone]) PCP: Dr. Ronny Flurry   CC: Left foot pain and ulcer x 1 week  HPI: Pt is a 50yo AA male with a PMHx of Uncontrolled Diabetes Mellitus, Type 2 (Last HgA1c 10.5 in 03/11), L great toe osteomyelitis s/p amputation in 02/11, HTN and HLD, who presented to [facility] with progressively worsening Left foot pain and ulceration. The patient was in his usual state of health until 1 week ago, at which time he developed a small blister on the sole of this L foot, under 1st metatarsaller head.. On 07/25/09, there was noted swelling and warmth of surrounding skin, and the blister erupted, forming an ulceration. Pt applied topical abtibiotic cream and rebandaged. Wound unwrapped yesterday, at which there was notable sloughing of skin, bleeding, and nonodorous discharge from the wound. Not regularly followed by Podiatry following L great toe amputation in 2/11. Does not follow with Podiatry regularly. Pt presented to [facility] 07/28/09 for evaluation and treatment of wound, at which it was decided that because of pt's uncontrolled diabetes, recent OM of L great toe, and current ulceration, admission would be warranted to allow for assessment of new OM, and for appropriate IV Antibiotics and wound care to be initiated. Pt does admit to recently working on his feet for 8-12 hours daily and notes CBG in 200s-300s x 1 week. States previously sugars were within 100s.   Pt also notes persistent cough, at times productive, since PNA in 03/11. Also with nasal congestion. Denies fevers, chills, nausea, vomiting, diarrhea, eating, drinking, voiding, stooling well. No sick contacts. Denies CP, difficulty breathing, shortness of breath.   ALLERGIES: NKDA  PAST MEDICAL HISTORY: 1.  Osteomyelitis, left great toe, SP amputation (2/11) 2. Diabetes mellitus, type II (HgA1c 10.5 in 03/11) 3. Hyperlipidemia (controlled with exception of LDL 129 and HDL 21 in 03/11) 4. Hypertension 5. Systolic heart failure with ejection fraction of 45-50% and grade 2 diastolic dysfunction.  MEDICATIONS: 1. ASPIRIN ADULT LOW STRENGTH 81 MG TBEC (ASPIRIN) Take 1 tablet by mouth once a day 2. LANTUS 100 UNIT/ML SOLN (INSULIN GLARGINE) inject 30 units Subcutaneously hs or as instructed 3. GLUCOPHAGE 500 MG TABS (METFORMIN HCL) Take 1 tablet by mouth two times a day 4. LISINOPRIL 40 MG TABS (LISINOPRIL) Take 1 tablet by mouth once a day 5. METOPROLOL SUCCINATE 100 MG XR24H-TAB (METOPROLOL SUCCINATE) Take 1 tablet by mouth once a day 6. NOVOLOG PENFILL 100 UNIT/ML SOLN (INSULIN ASPART) Use per sliding scale protocol.Call  our clinic with any questions. 7. PRAVASTATIN SODIUM 40 MG TABS (PRAVASTATIN SODIUM) Take 1 tablet by mouth once a day 8. MUCINEX DM 30-600 MG XR12H-TAB (DEXTROMETHORPHAN-GUAIFENESIN) Take 1 tablet by mouth two times a day as needed for cough and congestion 9. FREESTYLE LITE TEST  STRP (GLUCOSE BLOOD) use to checkblood sugar 4x daily 10. LANCETS  MISC (LANCETS) use to check blood sugar 4x daily- please disspense 30 gauge or higher per patient preference.  SOCIAL HISTORY: Former Research scientist (life sciences) and quitted in 2008. Alcohol use-occasional Drug use-no Regular exercise-yes  Insurance: BCBS  FAMILY HISTORY Mother - Alive, has DM Father - Comptroller, no known medical conditions Sister - Alive, no known medical conditions Brother - Alive, no known medical conditions    ROS: As per HPI  VITALS: T:  98.3    P:  104  BP: 174/98    R: 18    O2SAT:  100%  ON: RA  PHYSICAL EXAM: Gen: Patient is in NAD, Pleasant. Eyes: PERRL, EOMI, No signs of anemia or jaundince. ENT: MMM, OP clear, No erythema, thrush or exudates. Neck: Supple, No carotid Bruits, No JVD, No thyromegaly Resp: CTA-  Bilaterally, No W/C/R. CVS: S1S2 RRR, No M/R/G GI: Abdomen is soft. ND, NT, NG, NR, BS+. No organomegaly. Ext: No pedal edema, cyanosis or clubbing. Skin: LLE with 1.5x1.5 erythematous, ulcerated lesion over dorsum of first metatarsal head, with surrounding warmth and mild edema. Mild malodorous discharge.  Left 1st digit amputation noted. Thickening and onychomycosis of BL LE toenails. R foot with callus on dorsum of first digit. Lymph: No palpable lymphadenopathy. MS: Moving all 4 extremities. Neuro: A&O X3, CN II - XII are grossly intact. Motor strength is 5/5 in the all 4 extremities, Sensations intact to light touch, Gait normal, Cerebellar signs negative. Psych: Appropriate  LABS: CBG: 295  ASSESSMENT AND PLAN: 1. Left Diabetic Foot Ulcer/ Cellulitis - Pt has had previous OM near the same site of infection, however, current infection does not appear to invade the bone. - Pending L foot XRay - to evaluate for osteomyelitis - Will admit to regular bed - Start IV Vancomycin for coverage of GPC including MRSA since pt is uncontrolled diabetic.  - Will check blood cultures, CBC - Consult wound care for possible debridgement versus appropriate wound management  2.  Diabetes mellitus, type II (HgA1c 10.5 in 03/11) - uncontrolled - Pt states he is compliant with his medications, however, diabetes remains uncontrolled - at this time it is unclear if pt is actually compliant with medications and has increased insulin requirements, or if he is not truly compliant with medications - Will restart home medications  including Metformin, Lantus, and SSI, sensitive - Will recheck HgA1c as last checked in 03/11 - Continue pt on ACE-I  3. Hyperlipidemia (controlled with exception of LDL 129 and HDL 21 in 03/11) - Fairly well controlled, will continue home medication regimen - Will recheck FLP  4. Hypertension - BP elevated at this time, however pt did not take medication this AM - Will restart  home Lisinopril, Metoprolol and monitor - will graduate medication doses as indicated  5. Systolic heart failure -  (03/11) Echo revealed LV ejection fraction of 45-50% and grade 2 diastolic dysfunction. - thought to be 2/2 to hypertensive disease.  - No evidence of fluid overload, shortness of breath, difficulty breathing - Will continue ACE-I and BB  6. Cough - DDx include ACE-I cough vs residual cough from recent PNA vs allergic cough - Most likely residual cough from residual PNA, will continue to watch closesly. If worsens during hospitalization, will consider futher evaluation with CXR - Symptomatic treatment with Tessalon Pearls if needed.  7. VTE PPX:  Heparin    ATTENDING: I performed and/or observed a history and physical examination of the patient.  I discussed the case with the residents as noted and reviewed the residents' notes.  I agree with the findings and plan--please refer to the attending physician note for more details.  Signature________________________________  Printed Name_____________________________

## 2010-02-03 NOTE — Miscellaneous (Signed)
Summary: Hospital Discharge [facility] Discharge  Date of admission:03/20/2009  Date of discharge:03/25/2009  Brief reason for admission/active problems: 1. CAP with bilateral pleural effusions. Treated with Azithromycin and rocephine IV and then transitioned to Avelox by mouth. 2. CHF with EF of [id number] and diastolic grade 2 dysfuction. New onset. Patient had elevated CE at the time of admission. Evaluated by Dr. Johnsie Cancel (Cardiology) --> thinks, it is NOT 2/2 MI; possible etiology would be a viral myocarditis given a very sudden onset. Started on Lisinopril and changed from Amlodipine to a metoprolol. 3. DM, type 2  --> uncontrolled. Stoped glipizide; will continue with Novolog SSI and Lantus; will need to f/u with Ms. Rhiley for DME. 4. HTN --meds adjusted. 5. Anemia. work up was negative. Likely, of an inflmattory etiology 2/2 CAP and chronic disease 2/2 DM. Hgb is 13 at the time of discharge.  Followup needed:1. reevaluate his anti-HTN medication regimen and adjust if indicated.2. f/u on CBG's and assess for hypoglycemic events. May repeat CXR in 4-6 weeks.  The medication and problem lists have been updated.  Please see the dictated discharge summary for details.   Medications: Added new medication of LISINOPRIL 40 MG TABS (LISINOPRIL) Take 1 tablet by mouth once a day - Signed Removed medication of GLIPIZIDE 10 MG TABS (GLIPIZIDE) Take 1 tablet by mouth two times a day - Signed Changed medication from LANTUS 100 UNIT/ML SOLN (INSULIN GLARGINE) Inject 15 units subqutaneously at bedtime to LANTUS 100 UNIT/ML SOLN (INSULIN GLARGINE) Inject 25 units subqutaneously at bedtime - Signed Removed medication of AMLODIPINE BESYLATE 2.5 MG TABS (AMLODIPINE BESYLATE) Take 1 tablet by mouth once a day - Signed Added new medication of METOPROLOL SUCCINATE 50 MG XR24H-TAB (METOPROLOL SUCCINATE) Take 1 tablet by mouth once a day - Signed Added new medication of NOVOLIN R 100 UNIT/ML SOLN (INSULIN  REGULAR HUMAN) Use sliding scale as instructed. - Signed Changed medication from LANTUS 100 UNIT/ML SOLN (INSULIN GLARGINE) Inject 25 units subqutaneously at bedtime to LANTUS 100 UNIT/ML SOLN (INSULIN GLARGINE) inject 30 units Subcutaneously hs or as instructed - Signed Added new medication of NOVOLOG PENFILL 100 UNIT/ML SOLN (INSULIN ASPART) Use per sliding scale protocol.Call  our clinic with any questions. - Signed Removed medication of NOVOLIN R 100 UNIT/ML SOLN (INSULIN REGULAR HUMAN) Use sliding scale as instructed. Changed medication from METOPROLOL SUCCINATE 50 MG XR24H-TAB (METOPROLOL SUCCINATE) Take 1 tablet by mouth once a day to METOPROLOL SUCCINATE 100 MG XR24H-TAB (METOPROLOL SUCCINATE) Take 1 tablet by mouth once a day - Signed Added new medication of AVELOX 400 MG TABS (MOXIFLOXACIN HCL) Take 1 tablet by mouth once a day for 5 days - Signed Removed medication of PERCOCET 5-325 MG TABS (OXYCODONE-ACETAMINOPHEN) Take 1 tablet by mouth every 6 hours as needed pain for the next 10 days while your foot is healing. Rx of LISINOPRIL 40 MG TABS (LISINOPRIL) Take 1 tablet by mouth once a day;  #30 x 3;  Signed;  Entered by: Milana Obey MD;  Authorized by: Milana Obey MD;  Method used: Print then Give to Patient Rx of LANTUS 100 UNIT/ML SOLN (INSULIN GLARGINE) Inject 25 units subqutaneously at bedtime;  #100 x 3;  Signed;  Entered by: Milana Obey MD;  Authorized by: Milana Obey MD;  Method used: Print then Give to Patient Rx of METOPROLOL SUCCINATE 50 MG XR24H-TAB (METOPROLOL SUCCINATE) Take 1 tablet by mouth once a day;  #30 x 3;  Signed;  Entered by: Milana Obey MD;  Authorized by:  Milana Obey MD;  Method used: Print then Give to Patient Rx of NOVOLIN R 100 UNIT/ML SOLN (INSULIN REGULAR HUMAN) Use sliding scale as instructed.;  #100 x 3;  Signed;  Entered by: Milana Obey MD;  Authorized by: Milana Obey MD;  Method used: Print then Give to Patient Rx of LANTUS 100  UNIT/ML SOLN (INSULIN GLARGINE) inject 30 units Subcutaneously hs or as instructed;  #100 x 5;  Signed;  Entered by: Milana Obey MD;  Authorized by: Milana Obey MD;  Method used: Print then Give to Patient Rx of NOVOLOG PENFILL 100 UNIT/ML SOLN (INSULIN ASPART) Use per sliding scale protocol.Call  our clinic with any questions.;  #100 x 5;  Signed;  Entered by: Milana Obey MD;  Authorized by: Milana Obey MD;  Method used: Print then Give to Patient Rx of METOPROLOL SUCCINATE 100 MG XR24H-TAB (METOPROLOL SUCCINATE) Take 1 tablet by mouth once a day;  #30 x 5;  Signed;  Entered by: Milana Obey MD;  Authorized by: Milana Obey MD;  Method used: Print then Give to Patient Rx of AVELOX 400 MG TABS (MOXIFLOXACIN HCL) Take 1 tablet by mouth once a day for 5 days;  #5 x 0;  Signed;  Entered by: Milana Obey MD;  Authorized by: Milana Obey MD;  Method used: Print then Give to Patient Observations: Added new observation of INSTRUCTIONS: Please come to an appointment at the outpatient clinic at [facility] with Dr. Stanford Scotland for a followup visit. Ms. Cyndi Bender will contact you with your appointment. Call the clinic if you do not hear from her within 2 days. Also, Please, f/u with Dr. Acie Fredrickson (Cardiolog) in 2 weeks. His office phone number is [phone]  Please take your medication as prescribed below. (03/23/2009 9:05)    Prescriptions: AVELOX 400 MG TABS (MOXIFLOXACIN HCL) Take 1 tablet by mouth once a day for 5 days  #5 x 0   Entered and Authorized by:   Milana Obey MD   Signed by:   Milana Obey MD on 03/24/2009   Method used:   Print then Give to Patient   RxID:   [id number] METOPROLOL SUCCINATE 100 MG XR24H-TAB (METOPROLOL SUCCINATE) Take 1 tablet by mouth once a day  #30 x 5   Entered and Authorized by:   Milana Obey MD   Signed by:   Milana Obey MD on 03/24/2009   Method used:   Print then Give to Patient   RxID:   [id number] NOVOLOG PENFILL  100 UNIT/ML SOLN (INSULIN ASPART) Use per sliding scale protocol.Call  our clinic with any questions.  #100 x 5   Entered and Authorized by:   Milana Obey MD   Signed by:   Milana Obey MD on 03/24/2009   Method used:   Print then Give to Patient   RxID:   [id number] LANTUS 100 UNIT/ML SOLN (INSULIN GLARGINE) inject 30 units Subcutaneously hs or as instructed  #100 x 5   Entered and Authorized by:   Milana Obey MD   Signed by:   Milana Obey MD on 03/24/2009   Method used:   Print then Give to Patient   RxID:   [id number] NOVOLIN R 100 UNIT/ML SOLN (INSULIN REGULAR HUMAN) Use sliding scale as instructed.  #100 x 3   Entered and Authorized by:   Milana Obey MD   Signed by:   Milana Obey MD on 03/23/2009   Method used:   Print then Give to Patient   RxID:   [id number] METOPROLOL  SUCCINATE 50 MG XR24H-TAB (METOPROLOL SUCCINATE) Take 1 tablet by mouth once a day  #30 x 3   Entered and Authorized by:   Milana Obey MD   Signed by:   Milana Obey MD on 03/23/2009   Method used:   Print then Give to Patient   [id number] LANTUS 100 UNIT/ML SOLN (INSULIN GLARGINE) Inject 25 units subqutaneously at bedtime  #100 x 3   Entered and Authorized by:   Milana Obey MD   Signed by:   Milana Obey MD on 03/23/2009   Method used:   Print then Give to Patient   RxID:   [id number] LISINOPRIL 40 MG TABS (LISINOPRIL) Take 1 tablet by mouth once a day  #30 x 3   Entered and Authorized by:   Milana Obey MD   Signed by:   Milana Obey MD on 03/23/2009   Method used:   Print then Give to Patient   [id number]    Patient Instructions: 1)  Please come to an appointment at the outpatient clinic at [facility] with Dr. Stanford Scotland for a followup visit. Ms. Cyndi Bender will contact you with your appointment. Call the clinic if you do not hear from her within 2 days. 2)  Also, Please, f/u with Dr. Acie Fredrickson (Cardiolog) in 2  weeks. His office phone number is [phone])  Please take your medication as prescribed below.    Appended Document: Hospital Discharge Summary The patient has been contacted and sch and appointment with Dr. Ronny Flurry on 04/10/2009 at 2:00pm

## 2010-02-03 NOTE — Letter (Signed)
Summary: Generic Letter  [facility]  [address]   [place],  [address]   Phone: [phone]  Fax: [phone]    08/04/2009       Employee:  [name]  dob:  2060-12-12   To Whom It May Concern:  For medical reasons, please excuse the above named employee from work for the following dates:  Start:   July 28, 2009  End:  August  12. 2011  If you need additional information, please feel free to contact our office at [phone]    Sincerely,     Desiree Hane, MD Physician [place]

## 2010-02-03 NOTE — Progress Notes (Signed)
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   Pt was discharged on 07/29/2009 and has not returned to work sine admission and discharge.   Pt is here waiting in the Clinics. [name] Nephew RN  August 04, 2009 4:35 PM   Initial call taken by: [name] Nephew RN,  August 04, 2009 4:35 PM  Follow-up for Phone Call        I will forward this to Drs [name] and [name] as they took care of [name] in the hospital.  Follow-up by: [name] Dresser MD,  August 04, 2009 5:20 PM  Additional Follow-up for Phone Call Additional follow up Details #1::        Dr [name] already took care of this. Ignore my note above.

## 2010-02-05 ENCOUNTER — Encounter (HOSPITAL_BASED_OUTPATIENT_CLINIC_OR_DEPARTMENT_OTHER): Payer: 59 | Attending: Internal Medicine

## 2010-02-05 DIAGNOSIS — L97509 Non-pressure chronic ulcer of other part of unspecified foot with unspecified severity: Secondary | ICD-10-CM | POA: Insufficient documentation

## 2010-02-05 DIAGNOSIS — E1169 Type 2 diabetes mellitus with other specified complication: Secondary | ICD-10-CM | POA: Insufficient documentation

## 2010-03-05 ENCOUNTER — Encounter (HOSPITAL_BASED_OUTPATIENT_CLINIC_OR_DEPARTMENT_OTHER): Payer: 59 | Attending: Internal Medicine

## 2010-03-05 DIAGNOSIS — L97509 Non-pressure chronic ulcer of other part of unspecified foot with unspecified severity: Secondary | ICD-10-CM | POA: Insufficient documentation

## 2010-03-05 DIAGNOSIS — E1169 Type 2 diabetes mellitus with other specified complication: Secondary | ICD-10-CM | POA: Insufficient documentation

## 2010-03-21 LAB — BASIC METABOLIC PANEL
Chloride: 96 mEq/L (ref 96–112)
Creatinine, Ser: 1.11 mg/dL (ref 0.4–1.5)
Glucose, Bld: 297 mg/dL — ABNORMAL HIGH (ref 70–99)
Potassium: 3.5 mEq/L (ref 3.5–5.1)

## 2010-03-21 LAB — CBC
HCT: 34.7 % — ABNORMAL LOW (ref 39.0–52.0)
Hemoglobin: 11.9 g/dL — ABNORMAL LOW (ref 13.0–17.0)
MCHC: 34.4 g/dL (ref 30.0–36.0)
MCV: 90.6 fL (ref 78.0–100.0)
Platelets: 290 10*3/uL (ref 150–400)
RBC: 3.83 MIL/uL — ABNORMAL LOW (ref 4.22–5.81)
RDW: 12.3 % (ref 11.5–15.5)
WBC: 8.2 10*3/uL (ref 4.0–10.5)

## 2010-03-21 LAB — GLUCOSE, CAPILLARY
Glucose-Capillary: 268 mg/dL — ABNORMAL HIGH (ref 70–99)
Glucose-Capillary: 283 mg/dL — ABNORMAL HIGH (ref 70–99)
Glucose-Capillary: 343 mg/dL — ABNORMAL HIGH (ref 70–99)
Glucose-Capillary: 385 mg/dL — ABNORMAL HIGH (ref 70–99)

## 2010-03-21 LAB — DIFFERENTIAL
Basophils Absolute: 0 10*3/uL (ref 0.0–0.1)
Lymphocytes Relative: 22 % (ref 12–46)
Lymphs Abs: 1.8 10*3/uL (ref 0.7–4.0)

## 2010-03-21 LAB — LIPID PANEL
LDL Cholesterol: 96 mg/dL (ref 0–99)
Triglycerides: 157 mg/dL — ABNORMAL HIGH (ref <150)

## 2010-03-21 LAB — WOUND CULTURE

## 2010-03-30 LAB — BASIC METABOLIC PANEL
BUN: 7 mg/dL (ref 6–23)
BUN: 7 mg/dL (ref 6–23)
CO2: 25 mEq/L (ref 19–32)
CO2: 25 mEq/L (ref 19–32)
CO2: 26 mEq/L (ref 19–32)
CO2: 26 mEq/L (ref 19–32)
Calcium: 8.5 mg/dL (ref 8.4–10.5)
Calcium: 9 mg/dL (ref 8.4–10.5)
Chloride: 100 mEq/L (ref 96–112)
Chloride: 103 mEq/L (ref 96–112)
Chloride: 98 mEq/L (ref 96–112)
Chloride: 99 mEq/L (ref 96–112)
Creatinine, Ser: 0.9 mg/dL (ref 0.4–1.5)
Creatinine, Ser: 0.91 mg/dL (ref 0.4–1.5)
Creatinine, Ser: 1 mg/dL (ref 0.4–1.5)
GFR calc Af Amer: >60 mL/min (ref >60)
GFR calc Af Amer: >60 mL/min (ref >60)
GFR calc Af Amer: >60 mL/min (ref >60)
GFR calc Af Amer: >60 mL/min (ref >60)
GFR calc non Af Amer: >60 mL/min (ref >60)
Glucose, Bld: 273 mg/dL — ABNORMAL HIGH (ref 70–99)
Glucose, Bld: 283 mg/dL — ABNORMAL HIGH (ref 70–99)
Potassium: 4 mEq/L (ref 3.5–5.1)
Potassium: 4 mEq/L (ref 3.5–5.1)
Potassium: 4 mEq/L (ref 3.5–5.1)
Sodium: 127 mEq/L — ABNORMAL LOW (ref 135–145)
Sodium: 137 mEq/L (ref 135–145)

## 2010-03-30 LAB — RAPID URINE DRUG SCREEN, HOSP PERFORMED
Cocaine: NOT DETECTED
Opiates: POSITIVE — AB
Tetrahydrocannabinol: NOT DETECTED

## 2010-03-30 LAB — COMPREHENSIVE METABOLIC PANEL
ALT: 45 U/L (ref 0–53)
Albumin: 2.9 g/dL — ABNORMAL LOW (ref 3.5–5.2)
Alkaline Phosphatase: 74 U/L (ref 39–117)
Chloride: 104 mEq/L (ref 96–112)
Glucose, Bld: 281 mg/dL — ABNORMAL HIGH (ref 70–99)
Potassium: 3.4 mEq/L — ABNORMAL LOW (ref 3.5–5.1)
Sodium: 137 mEq/L (ref 135–145)
Total Bilirubin: 0.4 mg/dL (ref 0.3–1.2)
Total Protein: 6.1 g/dL (ref 6.0–8.3)

## 2010-03-30 LAB — CBC
HCT: 33.1 % — ABNORMAL LOW (ref 39.0–52.0)
HCT: 37.6 % — ABNORMAL LOW (ref 39.0–52.0)
HCT: 38.3 % — ABNORMAL LOW (ref 39.0–52.0)
Hemoglobin: 11.3 g/dL — ABNORMAL LOW (ref 13.0–17.0)
Hemoglobin: 12.5 g/dL — ABNORMAL LOW (ref 13.0–17.0)
Hemoglobin: 12.8 g/dL — ABNORMAL LOW (ref 13.0–17.0)
Hemoglobin: 13.2 g/dL (ref 13.0–17.0)
MCHC: 34.3 g/dL (ref 30.0–36.0)
MCHC: 34.4 g/dL (ref 30.0–36.0)
MCV: 92.1 fL (ref 78.0–100.0)
MCV: 92.8 fL (ref 78.0–100.0)
MCV: 93.1 fL (ref 78.0–100.0)
Platelets: 279 10*3/uL (ref 150–400)
Platelets: 314 10*3/uL (ref 150–400)
RBC: 3.53 MIL/uL — ABNORMAL LOW (ref 4.22–5.81)
RBC: 3.75 MIL/uL — ABNORMAL LOW (ref 4.22–5.81)
RBC: 3.89 MIL/uL — ABNORMAL LOW (ref 4.22–5.81)
RBC: 4.04 MIL/uL — ABNORMAL LOW (ref 4.22–5.81)
RBC: 4.16 MIL/uL — ABNORMAL LOW (ref 4.22–5.81)
RDW: 12.4 % (ref 11.5–15.5)
RDW: 12.5 % (ref 11.5–15.5)
WBC: 8.1 10*3/uL (ref 4.0–10.5)

## 2010-03-30 LAB — IRON AND TIBC
Saturation Ratios: 8 % — ABNORMAL LOW (ref 20–55)
UIBC: 233 ug/dL

## 2010-03-30 LAB — URINALYSIS, ROUTINE W REFLEX MICROSCOPIC
Leukocytes, UA: NEGATIVE
Nitrite: NEGATIVE
Specific Gravity, Urine: 1.018 (ref 1.005–1.030)
Urobilinogen, UA: 1 mg/dL (ref 0.0–1.0)

## 2010-03-30 LAB — GLUCOSE, CAPILLARY
Glucose-Capillary: 167 mg/dL — ABNORMAL HIGH (ref 70–99)
Glucose-Capillary: 205 mg/dL — ABNORMAL HIGH (ref 70–99)
Glucose-Capillary: 214 mg/dL — ABNORMAL HIGH (ref 70–99)
Glucose-Capillary: 215 mg/dL — ABNORMAL HIGH (ref 70–99)
Glucose-Capillary: 233 mg/dL — ABNORMAL HIGH (ref 70–99)
Glucose-Capillary: 248 mg/dL — ABNORMAL HIGH (ref 70–99)
Glucose-Capillary: 264 mg/dL — ABNORMAL HIGH (ref 70–99)
Glucose-Capillary: 269 mg/dL — ABNORMAL HIGH (ref 70–99)
Glucose-Capillary: 285 mg/dL — ABNORMAL HIGH (ref 70–99)
Glucose-Capillary: 302 mg/dL — ABNORMAL HIGH (ref 70–99)

## 2010-03-30 LAB — POCT I-STAT, CHEM 8
BUN: 7 mg/dL (ref 6–23)
Chloride: 105 mEq/L (ref 96–112)
Creatinine, Ser: 1 mg/dL (ref 0.4–1.5)
Glucose, Bld: 219 mg/dL — ABNORMAL HIGH (ref 70–99)
HCT: 38 % — ABNORMAL LOW (ref 39.0–52.0)
Potassium: 3.9 mEq/L (ref 3.5–5.1)

## 2010-03-30 LAB — CARDIAC PANEL(CRET KIN+CKTOT+MB+TROPI)
CK, MB: 1.2 ng/mL (ref 0.3–4.0)
CK, MB: 1.5 ng/mL (ref 0.3–4.0)
Relative Index: 0.4 (ref 0.0–2.5)
Relative Index: 0.5 (ref 0.0–2.5)
Total CK: 266 U/L — ABNORMAL HIGH (ref 7–232)
Total CK: 353 U/L — ABNORMAL HIGH (ref 7–232)
Troponin I: 1.65 ng/mL (ref 0.00–0.06)
Troponin I: 1.66 ng/mL (ref 0.00–0.06)

## 2010-03-30 LAB — DIFFERENTIAL
Basophils Relative: 0 % (ref 0–1)
Eosinophils Absolute: 0.1 10*3/uL (ref 0.0–0.7)
Monocytes Absolute: 0.5 10*3/uL (ref 0.1–1.0)
Monocytes Relative: 6 % (ref 3–12)
Neutrophils Relative %: 71 % (ref 43–77)

## 2010-03-30 LAB — RETICULOCYTES
RBC.: 4.19 MIL/uL — ABNORMAL LOW (ref 4.22–5.81)
Retic Count, Absolute: 58.7 10*3/uL (ref 19.0–186.0)
Retic Ct Pct: 1.7 % (ref 0.4–3.1)

## 2010-03-30 LAB — LIPID PANEL
LDL Cholesterol: 129 mg/dL — ABNORMAL HIGH (ref 0–99)
Total CHOL/HDL Ratio: 6.4 RATIO
VLDL: 17 mg/dL (ref 0–40)

## 2010-03-30 LAB — CK TOTAL AND CKMB (NOT AT ARMC): CK, MB: 1.4 ng/mL (ref 0.3–4.0)

## 2010-03-30 LAB — MAGNESIUM: Magnesium: 1.6 mg/dL (ref 1.5–2.5)

## 2010-03-30 LAB — URINE MICROSCOPIC-ADD ON

## 2010-03-30 LAB — FERRITIN: Ferritin: 315 ng/mL (ref 22–322)

## 2010-03-30 LAB — LEGIONELLA ANTIGEN, URINE: Legionella Antigen, Urine: NEGATIVE

## 2010-03-30 LAB — FOLATE: Folate: 16.6 ng/mL

## 2010-03-30 LAB — D-DIMER, QUANTITATIVE: D-Dimer, Quant: 0.7 ug/mL-FEU — ABNORMAL HIGH (ref 0.00–0.48)

## 2010-04-09 ENCOUNTER — Encounter (HOSPITAL_BASED_OUTPATIENT_CLINIC_OR_DEPARTMENT_OTHER): Payer: 59

## 2010-04-12 LAB — GLUCOSE, CAPILLARY: Glucose-Capillary: 117 mg/dL — ABNORMAL HIGH (ref 70–99)

## 2010-04-13 LAB — WOUND CULTURE

## 2010-04-13 LAB — URINALYSIS, MICROSCOPIC ONLY
Glucose, UA: 500 mg/dL — AB
Ketones, ur: NEGATIVE mg/dL
Leukocytes, UA: NEGATIVE
pH: 5.5 (ref 5.0–8.0)

## 2010-04-13 LAB — LIPID PANEL
HDL: 24 mg/dL — ABNORMAL LOW (ref >39)
LDL Cholesterol: 119 mg/dL — ABNORMAL HIGH (ref 0–99)
Triglycerides: 105 mg/dL (ref <150)
VLDL: 21 mg/dL (ref 0–40)

## 2010-04-13 LAB — DIFFERENTIAL
Basophils Absolute: 0 10*3/uL (ref 0.0–0.1)
Basophils Relative: 0 % (ref 0–1)
Lymphocytes Relative: 27 % (ref 12–46)
Monocytes Absolute: 0.5 10*3/uL (ref 0.1–1.0)
Neutro Abs: 5.1 10*3/uL (ref 1.7–7.7)
Neutrophils Relative %: 66 % (ref 43–77)

## 2010-04-13 LAB — CULTURE, BLOOD (ROUTINE X 2)
Culture: NO GROWTH
Culture: NO GROWTH

## 2010-04-13 LAB — CBC
HCT: 35.7 % — ABNORMAL LOW (ref 39.0–52.0)
Hemoglobin: 12.2 g/dL — ABNORMAL LOW (ref 13.0–17.0)
Hemoglobin: 13.5 g/dL (ref 13.0–17.0)
MCHC: 33.7 g/dL (ref 30.0–36.0)
MCV: 90.4 fL (ref 78.0–100.0)
Platelets: 290 10*3/uL (ref 150–400)
RBC: 3.95 MIL/uL — ABNORMAL LOW (ref 4.22–5.81)
RBC: 4.44 MIL/uL (ref 4.22–5.81)
RDW: 13.6 % (ref 11.5–15.5)
WBC: 6.5 10*3/uL (ref 4.0–10.5)

## 2010-04-13 LAB — GLUCOSE, CAPILLARY
Glucose-Capillary: 176 mg/dL — ABNORMAL HIGH (ref 70–99)
Glucose-Capillary: 205 mg/dL — ABNORMAL HIGH (ref 70–99)

## 2010-04-13 LAB — BASIC METABOLIC PANEL
CO2: 27 mEq/L (ref 19–32)
Calcium: 9.3 mg/dL (ref 8.4–10.5)
Chloride: 104 mEq/L (ref 96–112)
Creatinine, Ser: 0.85 mg/dL (ref 0.4–1.5)
GFR calc Af Amer: >60 mL/min (ref >60)
GFR calc Af Amer: >60 mL/min (ref >60)
GFR calc non Af Amer: >60 mL/min (ref >60)
GFR calc non Af Amer: >60 mL/min (ref >60)
Glucose, Bld: 202 mg/dL — ABNORMAL HIGH (ref 70–99)
Potassium: 4.3 mEq/L (ref 3.5–5.1)
Sodium: 137 mEq/L (ref 135–145)
Sodium: 140 mEq/L (ref 135–145)

## 2010-04-13 LAB — RAPID URINE DRUG SCREEN, HOSP PERFORMED
Opiates: POSITIVE — AB
Tetrahydrocannabinol: NOT DETECTED

## 2010-04-14 LAB — GLUCOSE, CAPILLARY: Glucose-Capillary: 384 mg/dL — ABNORMAL HIGH (ref 70–99)

## 2010-04-16 ENCOUNTER — Ambulatory Visit (INDEPENDENT_AMBULATORY_CARE_PROVIDER_SITE_OTHER): Payer: 59 | Admitting: Internal Medicine

## 2010-04-16 ENCOUNTER — Encounter: Payer: Self-pay | Admitting: Internal Medicine

## 2010-04-16 VITALS — BP 183/100 | HR 104 | Temp 97.1°F | Ht 72.0 in | Wt 190.6 lb

## 2010-04-16 DIAGNOSIS — Z23 Encounter for immunization: Secondary | ICD-10-CM

## 2010-04-16 DIAGNOSIS — E119 Type 2 diabetes mellitus without complications: Secondary | ICD-10-CM

## 2010-04-16 DIAGNOSIS — I10 Essential (primary) hypertension: Secondary | ICD-10-CM

## 2010-04-16 DIAGNOSIS — H538 Other visual disturbances: Secondary | ICD-10-CM

## 2010-04-16 LAB — COMPREHENSIVE METABOLIC PANEL
ALT: 17 U/L (ref 0–53)
CO2: 24 mEq/L (ref 19–32)
Calcium: 9.5 mg/dL (ref 8.4–10.5)
Chloride: 99 mEq/L (ref 96–112)
Glucose, Bld: 318 mg/dL — ABNORMAL HIGH (ref 70–99)
Sodium: 136 mEq/L (ref 135–145)
Total Bilirubin: 0.5 mg/dL (ref 0.3–1.2)
Total Protein: 7.3 g/dL (ref 6.0–8.3)

## 2010-04-16 LAB — CBC WITH DIFFERENTIAL/PLATELET
Eosinophils Absolute: 0.1 10*3/uL (ref 0.0–0.7)
Eosinophils Relative: 1 % (ref 0–5)
Hemoglobin: 14.6 g/dL (ref 13.0–17.0)
Lymphocytes Relative: 28 % (ref 12–46)
Lymphs Abs: 1.7 10*3/uL (ref 0.7–4.0)
MCH: 30.2 pg (ref 26.0–34.0)
MCV: 88 fL (ref 78.0–100.0)
Monocytes Relative: 5 % (ref 3–12)
RBC: 4.83 MIL/uL (ref 4.22–5.81)
WBC: 6.2 10*3/uL (ref 4.0–10.5)

## 2010-04-16 LAB — TSH: TSH: 0.816 u[IU]/mL (ref 0.350–4.500)

## 2010-04-16 LAB — GLUCOSE, CAPILLARY: Glucose-Capillary: 357 mg/dL — ABNORMAL HIGH (ref 70–99)

## 2010-04-16 LAB — POCT GLYCOSYLATED HEMOGLOBIN (HGB A1C): Hemoglobin A1C: 10.3

## 2010-04-16 MED ORDER — SILDENAFIL CITRATE 25 MG PO TABS: 25.0000 mg | ORAL_TABLET | Freq: Every day | ORAL | Status: DC | PRN

## 2010-04-16 MED ORDER — LISINOPRIL 10 MG PO TABS: 10.0000 mg | ORAL_TABLET | Freq: Every day | ORAL | Status: DC

## 2010-04-16 MED ORDER — INSULIN NPH ISOPHANE & REGULAR (70-30) 100 UNIT/ML ~~LOC~~ SUSP: SUBCUTANEOUS | Status: DC

## 2010-04-16 NOTE — Progress Notes (Signed)
  Subjective:    Patient ID: [name], male    DOB: 09/17/1960, 50 y.o.   MRN: [id number]  HPI Patient is a 50 year old man with past medical history of diabetes and left foot amputation second cadaveric foot ulcer comes in today for a regular followup visit.  Patient says that he's been out of Lantus for few days and needs a refill. Patient also mentions that he is compliant with his medications but sometimes have trouble taking medication at nighttime because of his work. Patient does keep a monitor of his blood glucose but did not bring his meter today. Patient such A1c is 10.3 today. Patient says that he does everything as instructed and still his CBGs run from 200-300. He also mentions that if his CBG is less than 200 he feels less energetic.   Patient says that his problems with maintaining his erections throughout the sexual intercourse. Patient requests Viagra.  Patient's blood pressure is elevated today at 183/100. Patient says that he takes his blood pressure medications at time but is out of it for last 3 or 4 days.  Patient is due for all his blood tests, eye exam, vaccinations.   Review of Systems  Constitutional: Negative for fever, activity change and appetite change.  HENT: Negative for sore throat.   Respiratory: Negative for cough and shortness of breath.   Cardiovascular: Negative for chest pain and leg swelling.  Gastrointestinal: Negative for nausea, abdominal pain, diarrhea, constipation and abdominal distention.  Genitourinary: Negative for frequency, hematuria and difficulty urinating.  Neurological: Negative for dizziness and headaches.  Psychiatric/Behavioral: Negative for suicidal ideas and behavioral problems.       Objective:   Physical Exam  Constitutional: He is oriented to person, place, and time. He appears well-developed and well-nourished.  HENT:  Head: Normocephalic and atraumatic.  Eyes: Conjunctivae and EOM are normal. Pupils are equal, round,  and reactive to light. No scleral icterus.  Neck: Normal range of motion. Neck supple. No JVD present. No thyromegaly present.  Cardiovascular: Normal rate, regular rhythm, normal heart sounds and intact distal pulses.  Exam reveals no gallop and no friction rub.   No murmur heard. Pulmonary/Chest: Effort normal and breath sounds normal. No respiratory distress. He has no wheezes. He has no rales.  Abdominal: Soft. Bowel sounds are normal. He exhibits no distension and no mass. There is no tenderness. There is no rebound and no guarding.  Musculoskeletal: Normal range of motion. He exhibits no edema and no tenderness.  Lymphadenopathy:    He has no cervical adenopathy.  Neurological: He is alert and oriented to person, place, and time.  Psychiatric: He has a normal mood and affect. His behavior is normal.          Assessment & Plan:

## 2010-04-16 NOTE — Patient Instructions (Signed)
Insulin Injections, How & Where to Give, Adult People with type 1 diabetes must take insulin since their body does not make it. People with type 2 diabetes may require insulin. There are many different types of insulin. The type of insulin you take will determine how many injections you will take and when to take the injections.   CHOOSING A SITE FOR INJECTION: There are four main sites that can be used for injecting. Insulin absorption varies from site to site. It is best if you rotate within a body region and do not give the insulin in the same area for each injection.  Abdomen. This is the preferred site.   Front and upper outer sides of thighs.   Back of upper arm.   Buttocks.  USING SYRINGE AND VIAL: Drawing up insulin: single dose 1. Wash your hands.  2. If insulin is cloudy, roll the bottle (vial) between your hands or rotate the bottle from top to bottom to mix.  3. Clean the top of bottle with an alcohol swab. Be sure that the plastic pop-top lid has been removed on new bottles.  4. Pull air into the syringe equal to the amount of insulin dose you need.  5. Stick the syringe needle into the insulin bottle.  6. Inject the air into the bottle. Leave the bottle upright.  7. Leave the needle in the bottle and turn the bottle and syringe upside down.  8. Pull down slowly on the plunger, drawing insulin into the syringe. You may need to push the plunger up and down 2-3 times to slowly rid the syringe of air bubbles.  9. Draw up insulin into the syringe to the number of units desired.  10. Remove the needle/syringe from the bottle.  11. Use alcohol to clean the area of the body to be injected.  12. Pinch a fold of skin and inject the needle at a 90-degree angle (straight in). The needle may need to be injected at a 45-degree angle in small adults with little fat.  13. Push the plunger down to push the insulin into the fat/tissue.  14. Take the needle out of the tissue.  15. Discard the  used syringe properly.  Drawing up insulin: mixed dose  1. Wash your hands. 2. Roll the bottle (vial) of "cloudy" insulin between your hands or rotate the bottle from top to bottom to mix. 3. Clean the top of both bottles with an alcohol swab. Be sure that the plastic pop-top lid has been removed on new bottles. 4. Pull air into the syringe to equal the dose of "cloudy" insulin. 5. Stick the needle into the "cloudy" insulin bottle and inject the air. Be sure to keep the bottle upright. 6. Take the needle out of the "cloudy" bottle. 7. Pull air into the syringe to equal the dose of "clear" insulin. 8. Stick the needle into the "clear" insulin bottle and inject the air. 9. Leave the needle in the "clear" bottle and turn the bottle upside down. 10. Pull down on the plunger and slowly draw into the syringe the number of units of "clear" insulin desired.  11. You may have to push the plunger up and down 2-3 times to rid the syringe of air bubbles. 12. Take the needle out of the "clear" insulin bottle. 13. Stick the needle into the "cloudy" bottle. DO NOT inject any of the "clear" insulin into the "cloudy" bottle. 14. Turn the "cloudy" bottle upside down and pull the plunger down  to the number of units that equals the TOTAL number of units of "clear" and "cloudy" insulins. 15. Remove the needles from the "cloudy" bottle. 16. Use alcohol to clean the area of the body to be injected. 17. Pinch a fold of skin and inject the needle at a 90-degree angle (straight in). The needle may need to be injected at a 45-degree angle in small adults with little fat.  19. Push the plunger down to push the insulin into the fat/tissue. 20. Take the needle out of the fat/tissue. 21. Discard the used syringe properly.  USING INSULIN PENS 1. Wash your hands. 2. If you are using the "cloudy" insulin, roll the pen between your palms several times or rotate the pen top to  bottom several times. 3. Remove the insulin pen  cap. 4. Clean the rubber stopper of the cartridge with an alcohol swab. 5. Remove the protective paper tab from the disposable needle. 6. Screw the needle onto the pen. 7. Remove the outer plastic needle cover. 8. Remove the inner plastic needle cover. 9. Preform "air-shot"-turn the dial to two units. Hold the pen pointing up and push the button (dial) until a drop of insulin appears at the needle tip. If no insulin appears, repeat this step. 10. Dial the number of units of insulin you will inject. 11. Use alcohol to clean the area of the body to be injected. 12. Pinch a fold of skin and inject the needle at a 90-degree angle (straight in).  13. Push the button (dial) down to push the insulin into the fat/tissue. 14. Count to 5-10 slowly. Then, remove needle from fat/tissue. 15. Replace outer plastic needle cover over the needle and unscrew the capped needle. 16. Discard the used needle properly.   SEEK MEDICAL CARE IF:  You are having problems keeping your blood sugar at target range.   You are having episodes of low blood glucose (hypoglycemia).   You feel you might be having side effects from your medicines.   You have symptoms of an illness that are not improving after 3-4 days.   You have a sore or wound that is not healing.   You notice a change in vision or a new problem with your vision.   You develop a fever of more than 101F.  Document Released: 03/13/2003 Document Re-Released: 03/17/2009 [facility] Patient Information 2011 [place].    Please bring your meter to your next office visit. Set up an appointment in 2 weeks to see how your blood glucose is doing. You have been started on a new insulin which is to be injected twice a day.

## 2010-04-16 NOTE — Assessment & Plan Note (Signed)
Patient is currently taking just one blood pressure medication that has not been in his blood pressure is 180/100 which is grade 2. I suspect the patient will need more than one blood pressure medication especially with the diabetes I think he would benefit from ACE inhibitor. I would add lisinopril at that time. Would check BMet for creatinine and potassium and recheck in 2 weeks.

## 2010-04-16 NOTE — Assessment & Plan Note (Signed)
I will set set him up for an eye exam.

## 2010-04-17 LAB — MICROALBUMIN / CREATININE URINE RATIO: Microalb, Ur: 61.98 mg/dL — ABNORMAL HIGH (ref 0.00–1.89)

## 2010-04-21 LAB — URINALYSIS, ROUTINE W REFLEX MICROSCOPIC
Bilirubin Urine: NEGATIVE
Bilirubin Urine: NEGATIVE
Ketones, ur: 15 mg/dL — AB
Leukocytes, UA: NEGATIVE
Leukocytes, UA: NEGATIVE
Nitrite: NEGATIVE
Nitrite: NEGATIVE
Specific Gravity, Urine: 1.034 — ABNORMAL HIGH (ref 1.005–1.030)
Urobilinogen, UA: 1 mg/dL (ref 0.0–1.0)
Urobilinogen, UA: 1 mg/dL (ref 0.0–1.0)
pH: 5.5 (ref 5.0–8.0)
pH: 6 (ref 5.0–8.0)

## 2010-04-21 LAB — BASIC METABOLIC PANEL
BUN: 6 mg/dL (ref 6–23)
CO2: 27 mEq/L (ref 19–32)
CO2: 28 mEq/L (ref 19–32)
Calcium: 8.6 mg/dL (ref 8.4–10.5)
Calcium: 8.6 mg/dL (ref 8.4–10.5)
Chloride: 101 mEq/L (ref 96–112)
Chloride: 98 mEq/L (ref 96–112)
Creatinine, Ser: 0.77 mg/dL (ref 0.4–1.5)
Creatinine, Ser: 0.89 mg/dL (ref 0.4–1.5)
GFR calc Af Amer: >60 mL/min (ref >60)
GFR calc Af Amer: >60 mL/min (ref >60)
GFR calc non Af Amer: >60 mL/min (ref >60)
Glucose, Bld: 287 mg/dL — ABNORMAL HIGH (ref 70–99)
Potassium: 3.9 mEq/L (ref 3.5–5.1)
Sodium: 135 mEq/L (ref 135–145)

## 2010-04-21 LAB — WOUND CULTURE: Culture: NO GROWTH

## 2010-04-21 LAB — CBC
HCT: 35.5 % — ABNORMAL LOW (ref 39.0–52.0)
Hemoglobin: 12.1 g/dL — ABNORMAL LOW (ref 13.0–17.0)
MCHC: 34.7 g/dL (ref 30.0–36.0)
MCHC: 34.8 g/dL (ref 30.0–36.0)
MCV: 88.3 fL (ref 78.0–100.0)
Platelets: 305 10*3/uL (ref 150–400)
Platelets: 312 10*3/uL (ref 150–400)
RBC: 3.98 MIL/uL — ABNORMAL LOW (ref 4.22–5.81)
RBC: 4.7 MIL/uL (ref 4.22–5.81)
RDW: 11.9 % (ref 11.5–15.5)
WBC: 10.8 10*3/uL — ABNORMAL HIGH (ref 4.0–10.5)
WBC: 7.1 10*3/uL (ref 4.0–10.5)
WBC: 8.4 10*3/uL (ref 4.0–10.5)

## 2010-04-21 LAB — GLUCOSE, CAPILLARY
Glucose-Capillary: 122 mg/dL — ABNORMAL HIGH (ref 70–99)
Glucose-Capillary: 133 mg/dL — ABNORMAL HIGH (ref 70–99)
Glucose-Capillary: 136 mg/dL — ABNORMAL HIGH (ref 70–99)
Glucose-Capillary: 147 mg/dL — ABNORMAL HIGH (ref 70–99)
Glucose-Capillary: 150 mg/dL — ABNORMAL HIGH (ref 70–99)
Glucose-Capillary: 167 mg/dL — ABNORMAL HIGH (ref 70–99)
Glucose-Capillary: 248 mg/dL — ABNORMAL HIGH (ref 70–99)
Glucose-Capillary: 252 mg/dL — ABNORMAL HIGH (ref 70–99)

## 2010-04-21 LAB — CULTURE, BLOOD (ROUTINE X 2)
Culture: NO GROWTH
Culture: NO GROWTH

## 2010-04-21 LAB — COMPREHENSIVE METABOLIC PANEL
BUN: 9 mg/dL (ref 6–23)
CO2: 26 mEq/L (ref 19–32)
Calcium: 9.2 mg/dL (ref 8.4–10.5)
GFR calc non Af Amer: >60 mL/min (ref >60)
Glucose, Bld: 286 mg/dL — ABNORMAL HIGH (ref 70–99)
Total Protein: 7.9 g/dL (ref 6.0–8.3)

## 2010-04-21 LAB — RAPID URINE DRUG SCREEN, HOSP PERFORMED
Amphetamines: NOT DETECTED
Cocaine: NOT DETECTED
Opiates: NOT DETECTED
Tetrahydrocannabinol: NOT DETECTED

## 2010-04-21 LAB — URINE MICROSCOPIC-ADD ON

## 2010-04-21 LAB — ANAEROBIC CULTURE

## 2010-04-21 LAB — LIPID PANEL
Cholesterol: 173 mg/dL (ref 0–200)
LDL Cholesterol: 128 mg/dL — ABNORMAL HIGH (ref 0–99)
Total CHOL/HDL Ratio: 5.2 RATIO
VLDL: 12 mg/dL (ref 0–40)

## 2010-04-21 LAB — POCT I-STAT 3, VENOUS BLOOD GAS (G3P V)
O2 Saturation: 31 %
pCO2, Ven: 46.3 mmHg (ref 45.0–50.0)
pH, Ven: 7.382 — ABNORMAL HIGH (ref 7.250–7.300)
pO2, Ven: 21 mmHg — CL (ref 30.0–45.0)

## 2010-04-21 LAB — DIFFERENTIAL
Basophils Relative: 0 % (ref 0–1)
Monocytes Relative: 6 % (ref 3–12)
Neutro Abs: 9.1 10*3/uL — ABNORMAL HIGH (ref 1.7–7.7)
Neutrophils Relative %: 84 % — ABNORMAL HIGH (ref 43–77)

## 2010-04-21 LAB — HEMOGLOBIN A1C
Hgb A1c MFr Bld: 13 % — ABNORMAL HIGH (ref 4.6–6.1)
Mean Plasma Glucose: 326 mg/dL

## 2010-04-30 ENCOUNTER — Ambulatory Visit (INDEPENDENT_AMBULATORY_CARE_PROVIDER_SITE_OTHER): Payer: 59 | Admitting: Internal Medicine

## 2010-04-30 ENCOUNTER — Encounter: Payer: Self-pay | Admitting: Internal Medicine

## 2010-04-30 VITALS — BP 199/101 | HR 91 | Temp 98.2°F | Ht 72.0 in | Wt 194.7 lb

## 2010-04-30 DIAGNOSIS — I10 Essential (primary) hypertension: Secondary | ICD-10-CM

## 2010-04-30 DIAGNOSIS — F529 Unspecified sexual dysfunction not due to a substance or known physiological condition: Secondary | ICD-10-CM

## 2010-04-30 DIAGNOSIS — E119 Type 2 diabetes mellitus without complications: Secondary | ICD-10-CM

## 2010-04-30 DIAGNOSIS — R37 Sexual dysfunction, unspecified: Secondary | ICD-10-CM

## 2010-04-30 LAB — BASIC METABOLIC PANEL
BUN: 14 mg/dL (ref 6–23)
Calcium: 9.8 mg/dL (ref 8.4–10.5)
Creat: 1.13 mg/dL (ref 0.40–1.50)
Glucose, Bld: 166 mg/dL — ABNORMAL HIGH (ref 70–99)

## 2010-04-30 MED ORDER — VARDENAFIL HCL 10 MG PO TABS: 10.0000 mg | ORAL_TABLET | ORAL | Status: DC | PRN

## 2010-04-30 MED ORDER — CHLORTHALIDONE 50 MG PO TABS: 50.0000 mg | ORAL_TABLET | Freq: Every day | ORAL | Status: DC

## 2010-04-30 MED ORDER — VARDENAFIL HCL 20 MG PO TABS: 10.0000 mg | ORAL_TABLET | ORAL | Status: DC | PRN

## 2010-04-30 MED ORDER — LISINOPRIL 20 MG PO TABS: 20.0000 mg | ORAL_TABLET | Freq: Every day | ORAL | Status: DC

## 2010-04-30 NOTE — Patient Instructions (Signed)
Hypertension (High Blood Pressure) As your heart beats, it forces blood through your arteries. This force is your blood pressure. If the pressure is too high, it is called hypertension (HTN) or high blood pressure. HTN is dangerous because you may have it and not know it. High blood pressure may mean that your heart has to work harder to pump blood. Your arteries may be narrow or stiff. The extra work puts you at risk for heart disease, stroke, and other problems.  Blood pressure consists of two numbers, a higher number over a lower, 110/72, for example. It is stated as "110 over 72." The ideal is below 120 for the top number (systolic) and under 80 for the bottom (diastolic). Write down your blood pressure today. You should pay close attention to your blood pressure if you have certain conditions such as:  Heart failure.  Prior heart attack.   Diabetes   Chronic kidney disease.   Prior stroke.   Multiple risk factors for heart disease.   To see if you have HTN, your blood pressure should be measured while you are seated with your arm held at the level of the heart. It should be measured at least twice. A one-time elevated blood pressure reading (especially in the Emergency Department) does not mean that you need treatment. There may be conditions in which the blood pressure is different between your right and left arms. It is important to see your caregiver soon for a recheck. Most people have essential hypertension which means that there is not a specific cause. This type of high blood pressure may be lowered by changing lifestyle factors such as:  Stress.  Smoking.   Lack of exercise.   Excessive weight.  Drug/tobacco/alcohol use.   Eating less salt.   Most people do not have symptoms from high blood pressure until it has caused damage to the body. Effective treatment can often prevent, delay or reduce that damage. TREATMENT Treatment for high blood pressure, when a cause has been  identified, is directed at the cause. There are a large number of medications to treat HTN. These fall into several categories, and your caregiver will help you select the medicines that are best for you. Medications may have side effects. You should review side effects with your caregiver. If your blood pressure stays high after you have made lifestyle changes or started on medicines,   Your medication(s) may need to be changed.   Other problems may need to be addressed.   Be certain you understand your prescriptions, and know how and when to take your medicine.   Be sure to follow up with your caregiver within the time frame advised (usually within two weeks) to have your blood pressure rechecked and to review your medications.   If you are taking more than one medicine to lower your blood pressure, make sure you know how and at what times they should be taken. Taking two medicines at the same time can result in blood pressure that is too low.  SEEK IMMEDIATE MEDICAL CARE IF YOU DEVELOP:  A severe headache, blurred or changing vision, or confusion.   Unusual weakness or numbness, or a faint feeling.   Severe chest or abdominal pain, vomiting, or breathing problems.  MAKE SURE YOU:   Understand these instructions.   Will watch your condition.   Will get help right away if you are not doing well or get worse.  Document Released: 12/21/2004 Document Re-Released: 06/10/2009 [employer] Patient Information 2011 [employer].  Follow up in 2 weeks. Bring your meter at your next office visit.

## 2010-04-30 NOTE — Assessment & Plan Note (Signed)
We changed his insulin from Lantus to 70/30 during last office visit. Patient was advised to bring his meter to the next office visit. Apparently his CBGs have been doing much better with the change.

## 2010-04-30 NOTE — Progress Notes (Signed)
  Subjective:    Patient ID: [name], male    DOB: Jun 26, 1960, 50 y.o.   MRN: [id number]  HPI  [name] is a 50 year old man with past medical history most significant for uncontrolled diabetes and uncontrolled hypertension. Patient was seen 2 weeks ago and was started on lisinopril. Patient's Lantus was also changed to 70/30.  Patient did not bring his meter today but said that his morning CBG was 124 and postprandial was 180. Patient was advised to bring his meter to the next office visit in 2 weeks.  Patient's blood pressure is 199/100 and 1 repeat blood pressure in the room systolic was 99991111. Patient insists that he is compliant with both blood pressure medications that his Norvasc and lisinopril. We would need to readjust his blood pressure medications today.  Patient complains that the Viagra which was prescribed last visit is $600 for 30 pills. He requests an alternative medication.  No other complaints today  Review of Systems  Constitutional: Negative for fever, activity change and appetite change.  HENT: Negative for sore throat.   Respiratory: Negative for cough and shortness of breath.   Cardiovascular: Negative for chest pain and leg swelling.  Gastrointestinal: Negative for nausea, abdominal pain, diarrhea, constipation and abdominal distention.  Genitourinary: Negative for frequency, hematuria and difficulty urinating.  Neurological: Negative for dizziness and headaches.  Psychiatric/Behavioral: Negative for suicidal ideas and behavioral problems.       Objective:   Physical Exam  Constitutional: He is oriented to person, place, and time. He appears well-developed and well-nourished.  HENT:  Head: Normocephalic and atraumatic.  Eyes: Conjunctivae and EOM are normal. Pupils are equal, round, and reactive to light. No scleral icterus.  Neck: Normal range of motion. Neck supple. No JVD present. No thyromegaly present.  Cardiovascular: Normal rate, regular rhythm,  normal heart sounds and intact distal pulses.  Exam reveals no gallop and no friction rub.   No murmur heard. Pulmonary/Chest: Effort normal and breath sounds normal. No respiratory distress. He has no wheezes. He has no rales.  Abdominal: Soft. Bowel sounds are normal. He exhibits no distension and no mass. There is no tenderness. There is no rebound and no guarding.  Musculoskeletal: Normal range of motion. He exhibits no edema and no tenderness.  Lymphadenopathy:    He has no cervical adenopathy.  Neurological: He is alert and oriented to person, place, and time.  Psychiatric: He has a normal mood and affect. His behavior is normal.          Assessment & Plan:

## 2010-04-30 NOTE — Assessment & Plan Note (Addendum)
Agents blood pressure is still very high after the addition of lisinopril. I suspect the patient would require a diuretic therapy to enhance the effect of lisinopril as well as diuretic and it's always help bring down the blood pressure significantly. I would start this patient on chlorthalidone 50 mg once daily. I would also go up on lisinopril to 20 mg daily. I will again check Bmet today. Patient was advised to take his blood pressures as prescribed and by a blood pressure monitoring machine at home.  The [facility] doctor who sees him next is requested to send basic metabolic profile at next office visit which is scheduled to be in 2 weeks.

## 2010-05-13 ENCOUNTER — Encounter: Payer: 59 | Admitting: Internal Medicine

## 2010-05-19 NOTE — Assessment & Plan Note (Signed)
[facility]   NAMEMarland Kitchen  [name], [name]                 ACCOUNT NO.:  [id number]   MEDICAL RECORD NO.:  [id number]      DATE OF BIRTH:  03-06-1960   PHYSICIAN:  Ricard Dillon, M.D. VISIT DATE:  05/16/2008                                   OFFICE VISIT   [name] is a gentleman we have been following for a wound at his left  first toe ray amputation.  He had osteomyelitis.  He is type 2 diabetic  on insulin and oral agents.  We applied an Apligraf to this area on  April 25, 2008.  The wound is being followed by [facility].   On examination, the area on his left foot is considerably improved,  currently measuring 0.9 x 0.3 x 0.1.  There is a healthy granulation  area here.  The original slit-like area in the inferior recess of this  wound is close to resolving.  I did a nonexcisional debridement of some  surface callus and from the circumference of this wound.  Otherwise,  this appears to be very healthy.   IMPRESSION:  Wagner grade 3 diabetic foot wound status post surgery.  We  applied Puracol, hydrogel, dry dressing, and an Ace wrap.  The wound  continues to look improved largely secondary to the Apligraf.  I think  this may close over in the next week or 2.  We did write him a letter  for continued disability insurance.           ______________________________  Ricard Dillon, M.D.     MGR/MEDQ  D:  05/16/2008  T:  05/17/2008  Job:  [id number]

## 2010-05-19 NOTE — Assessment & Plan Note (Signed)
[facility]   NAMEMarland Kitchen  NAJEH, BEEL NO.:  [id number]   MEDICAL RECORD NO.:  [id number]      DATE OF BIRTH:  14-Aug-1960   PHYSICIAN:  Ricard Dillon, M.D.      VISIT DATE:                                   OFFICE VISIT   LOCATION:  [facility].   [name] is a gentleman we have been following for a diabetic ulcer at  the site of a left first ray amputation for osteomyelitis.  He has been  left with a surgical wound on healing.  I applied an Apligraf to him 2  weeks ago and he returns today in followup.   On examination, his temperature is 98.4.  The area of the wound measures  0.7 x 1.3 x 0.1.  This compares to 1.5 x 0.9 x 0.1 before the Apligraf  placement.  He is redeveloping a crevice at the inferior part of this  wound that will probably need to be debrided next week.   IMPRESSION:  Wegener's grade 3 diabetic foot wound status post surgery.  He is doing reasonably well.  When he was first measured here, the area  measures 2 x 3, so we are definitely making progress.  We will probably  need to do a light debridement of this next week, consider a re-  application of an Apligraf in 2 weeks from now.           ______________________________  Ricard Dillon, M.D.     MGR/MEDQ  D:  05/09/2008  T:  05/10/2008  Job:  [id number]

## 2010-05-19 NOTE — Assessment & Plan Note (Signed)
[facility]   NAMEMarland Kitchen  [name], [name]                 ACCOUNT NO.:  [id number]   MEDICAL RECORD NO.:  [id number]      DATE OF BIRTH:  [birth year]   PHYSICIAN:  Ricard Dillon, M.D. VISIT DATE:  04/11/2008                                   OFFICE VISIT   [name] is a gentleman who had a first toe ray amputation for  osteomyelitis.  He is a diabetic on insulin and oral agents (type 2).  He has been left with a surgical wound that is not healing.  The  original surgery, I believe, it was in February at [facility].  He is going  to be completing IV antibiotics this weekend and has been receiving  Silverlon to the wound through [facility].  He does not have  pain, fever, or excessive drainage.   On examination, the wound measurements are 2.4 x 1.4 x 0.1, this is  improved from last time.  The wound was debrided of circumferential  surrounding callus and the bed of the wound underwent a debridement of  surface slough.  This cleansed up quite nicely, and it looks as though  there is healthy granulation.   IMPRESSION:  Wagner grade 3 diabetic foot wound status post surgery, now  with a nonhealing wound in the surgical site.  I have continued the  Silverlon, hydrogel, dry dressing, Ace wrap, and a healing sandal  through [facility].  The patient appears to be making  improvement.  He would be a candidate for hyperbaric oxygen should this  area not progress towards healing.  I did briefly discuss this with him.  He is due to have an MRI next week in followup.  If this continues to  show osteomyelitis, I will press to have him consider hyperbaric oxygen  (therapy).           ______________________________  Ricard Dillon, M.D.     MGR/MEDQ  D:  04/11/2008  T:  04/12/2008  Job:  [id number]

## 2010-05-19 NOTE — Consult Note (Signed)
NAMECARA, PAYMENT                 ACCOUNT NO.:  [id number]   MEDICAL RECORD NO.:  [id number]          PATIENT TYPE:  INP   LOCATION:  5014                         FACILITY:  [place]   PHYSICIAN:  Dahlia Bailiff, MD    DATE OF BIRTH:  1960-06-17   DATE OF CONSULTATION:  DATE OF DISCHARGE:                                 CONSULTATION   Consultation for evaluation of left wet gangrene of the great toe.   HISTORY:  [name] is a very pleasant 50 year old gentleman who was poorly  controlled noncompliant diabetic with what he complains of 1-day history  of increasing left great toe pain.  The patient states he was unaware of  significant ulceration on the plantar surface of the left great toe.  He  states that today it started to hurt and so he presented to the ER.  He  had obvious purulent drainage from the toe with significant foul odor.   The initial signs were consistent with wet gangrene and so orthopedic  consultation was requested.   His x-rays showed evidence of lytic changes of the great toe consistent  with osteo.  I was consulted for further evaluation and treatment.   The patient's past medical, surgical, family, social history includes  diabetes.  He denies illicit drug use.  He is a former smoker and he  occasionally uses alcohol.   He has no known drug allergies any he says he is on no medications.   PHYSICAL EXAMINATION:  He is a pleasant gentleman who appears his stated  age in no acute distress.  He is alert and oriented x3.  He is afebrile.  Stable vital signs.  Cranial nerves II through XII were grossly intact.  He has no shortness of breath or chest pain.  The abdomen is soft and  nontender.  He had diminished sensation bilaterally in the foot and  distal calf.  He has weak barely palpable pulses on my exam at the  ankle.  Capillary refill is difficult because of thickening of the nail  bed.  He has an obvious ulceration of the left great toe with foul  purulent  drainage.   PLAN:  At this point in time, I am hesitant to recommend amputation of  just the great toe as I think there is cellulitis and myositis that  extends up into the mid foot region.  He has edematous, boggy skin  consistent with myositis.  My recommendation, since he is not septic at  this point for medical admission for appropriate workup of his diabetes  and better control of this as well as an MRI and vascular studies.  The  MRI will help gauge the extent to the myositis and osteo and the  vascular studies will help guide as to whether or not a revascular  procedure is needed or to determine the level of amputation.  I have  discussed this with the patient, he is in agreement with the plan.   I have ordered vascular studies for the morning as well as the MRI and a  posterior splint just for  mobilization.  I will contact the Vascular  Service myself tomorrow to discuss the case.      Dahlia Bailiff, MD  Electronically Signed     DDB/MEDQ  D:  02/26/2008  T:  02/27/2008  Job:  [id number]   cc:   [place]

## 2010-05-19 NOTE — Assessment & Plan Note (Signed)
[facility]   NAMEKERMITH, MANTHEY                 ACCOUNT NO.:  [id number]   MEDICAL RECORD NO.:  [id number]      DATE OF BIRTH:  1960/11/10   PHYSICIAN:  Orlando Penner. Sevier, M.D.  VISIT DATE:  09/04/2008                                   OFFICE VISIT   HISTORY:  This 50 year old diabetic black male has been followed for a  plantar ulcer on the fifth metatarsal head area on the left foot.  He is  status post amputation of the left hallux.   Last week I had him bring in his diabetic shoes.  They appeared adequate  and I have asked him to wear them, but he arrives again today without  them.  Apparently, he does not like the appearance and is working on  getting a Statistician fitted to look more like sneakers.  I have told  him that until such time as he is able to do that, he really needs to  wear the ones that he had previously been provided.   Whatever, today he reports no symptoms related to the wound itself, but  he does have some pain running laterally up the distal left lower  extremity posteriorly along the tendons there.  I do not see any  evidence whatsoever of deep infection in that area, and I think it may  well be a mechanical pain due to the fact that he is not in the  desirable footwear.   On examination, blood pressure 188/95, pulse 99, respirations 20,  temperature 98.4.  The ulcer itself now appears quite innocent, measures  0.4 x 0.4 x 0.1 cm, has skin margins nicely stuck and advancing and  minimal surrounding callus.   IMPRESSION:  Satisfactory course, diabetic foot ulcer, left lower  extremity.   DISPOSITION:  1. The wound needs no debridement today.  2. UltraMide 25 was applied widely to the dry skin of that distal      lower extremity and foot.   The wound itself is treated again with an application of Silverlon,  hydrogel, and SofSorb pad.   He is cautioned to wear his diabetic shoes.  His dressing changes are to  be every 2 days at  home, and followup visit will be here in 2 weeks.           ______________________________  Orlando Penner [place] Pepper, M.D.     RES/MEDQ  D:  09/04/2008  T:  09/05/2008  Job:  [id number]

## 2010-05-19 NOTE — Discharge Summary (Signed)
NAMETAYMAR, [name] NO.:  [id number]   MEDICAL RECORD NO.:  [id number]          PATIENT TYPE:  INP   LOCATION:  5031                         FACILITY:  [facility]   PHYSICIAN:  Felicity Pellegrini, MD     DATE OF BIRTH:  [birth year]   DATE OF ADMISSION:  06/05/2008  DATE OF DISCHARGE:  06/06/2008                               DISCHARGE SUMMARY   DISCHARGE DIAGNOSES:  1. Full thickness ulcer, left lateral foot and large intact bulla on      the underside of left forefoot.  2. Diabetes mellitus type 2.  3. History of left great toe osteomyelitis, status post amputation in      February 2010.  4. Hypertension.  5. Hyperlipidemia.   DISCHARGE MEDICATIONS:  [name] is discharged with prescription for  1. doxycycline 100 mg twice a day for 10 days.  2. Lisinopril/hydrochlorothiazide 20 - 25 mg 1 tablet daily.  3. Pravastatin 40 mg 1 tablet daily.  4. Glucophage 500 mg 1 tablet twice daily.  5. Percocet 325 one tablet every 6 hours as needed for pain, 20      tablets written for at time of discharge.  6. Lantus insulin inject 15 units subcutaneously at bedtime.  7. Glipizide 10 mg 1 tablet by mouth twice a day.  8. Aspirin 81 mg daily.   DISPOSITION AND FOLLOWUP:  [name] is to follow up with the [facility] on either June 10, 2008, or June 11, 2008.  He has been in contact  with the wound care center and he will call to make that appointment.  He will follow up in outpatient clinic on July 02, 2008, with Dr. Ronny Flurry.  He has been instructed to call the clinic if his symptoms worsen.   PROCEDURES PERFORMED:  1. X-ray of left foot performed on June 06, 2008, showed no      radiographic changes suggestive of osteomyelitis, no acute bony      abnormality.  2. Venous Doppler of left lower extremity performed, June 06, 2008, is      negative for evidence of DVT or Baker cyst, but, however, an      enlarged inguinal lymph node, which is most likely related to  inflammation.   CONSULTATIONS:  There were no consultations during this hospitalization.   ADMITTING HISTORY AND PHYSICAL:  [name] is a 50 year old African  American male with history of left great toe osteomyelitis, status post  amputation in February 2010, presenting with complaint of open draining  blister on the lateral aspect of the left foot.  He states that 7 days  before admission, he was vacationing in [facility], during which time  he wore a new boot, unlaced and went for a long walk.  After this walk  he removed his boot and noticed a large blister on the lateral aspect of  the left foot, which had burst and was draining a bloody purulent  looking discharge.  He states, the next morning he awoke and his entire  foot and ankle were swollen, red and warm to  touch.  Over the next 5  days prior to this admission, he states that the swelling increased.  The patient denies fever, chills, nausea, vomiting, or weakness.   PHYSICAL EXAMINATION:  VITAL SIGNS:  Temperature is 97.9 degrees, pulse  120, and blood pressure is 153/96.  Orthostatic vital signs are negative  by blood pressure and mildly positive with a pulse that rose from 106-  123 with standing.  GENERAL:  The patient is in no acute distress, is a healthy-appearing  male.  HEENT:  Vision is grossly intact.  Pupils are equal, round, and  reactive.  There is good dentition.  Mucous membranes are moist.  Neck  is supple.  LUNGS:  Lungs are clear to auscultation bilaterally with good air  movement.  HEART:  Normal rate and rhythm.  No murmurs, rubs, or gallops.  ABDOMEN:  Soft and nontender with normal bowel sounds.  EXTREMITIES:  Left foot and ankle diffusely swollen with moderate  warmth, erythema and tenderness to palpation.  There is no crepitus.  There is moderate swelling over the distal aspect of the left lower  extremity as well.  There is a full skin thickness lesion on the lateral  aspect of the left foot  with serosanguineous drainage.  There is an  intact bulla across the pad of the left forefoot.  Pulses are 2+.  NEUROLOGIC:  Nonfocal.   ADMISSION LABORATORIES:  White blood cells 7.7, hemoglobin 13.5,  hematocrit 40.1, and platelets 351.  Sodium 137, potassium 4.1, chloride  101, bicarb 27, BUN 12, creatinine 0.85, glucose 202, and calcium 9.3.   Urine drug screen positive for opiates, which are prescribed to this  patient.   HOSPITAL COURSE:  1. Left foot lesion.  The patient was admitted and started on empiric      vancomycin and Zosyn therapy for 24 hours.  He had Rapid resolution      of swelling and drainage from the wound.  He was seen by wound care      nursing who advised a wedge boot to relieve pressure off of the      forefoot as well as daily dressing changes and hydrogel      applications.  He will follow up with the [facility] in      approximately 5 days after discharge for reevaluation of the wound.      He was discharged on doxycycline 100 mg p.o. b.i.d.  2. Diabetes.  The patient was maintained on only insulin at bedtime      and sliding scale.  While in hospital, blood sugars ranged from 180-      350, it should be noted that his hemoglobin A1c in February of this      year was 73, and in May of this year had decreased to 9.3.  Close      monitoring of his diabetes mellitus and adherence to medications      will be important in the future for this patient.  3. Hypertension.  Blood pressures in hospital ranged from 130-180, on      home regimen of hydrochlorothiazide and lisinopril, elevations in      blood pressure may have been due to pain in the acute setting, but      this should be followed in the outpatient setting for escalation of      therapy as appropriate.  4. Hyperlipidemia.  Last lipid panel in February 2010, showing total      cholesterol  of 173 and LDL cholesterol of 128.  The patient was      discharged in February on Zocor 20, but due to  difficulty obtaining      medication had been switched to Pravachol, clinic then mentioned      the dose of 10 mg daily.  This has been changed to 40 mg daily.      This should be followed in the outpatient setting.  In hospital,      lipid panel during this admission showed cholesterol of 164 and an      LDL of 119.   DISCHARGE LABORATORIES AND VITAL SIGNS:  Temperature 98.6, pulse 90,  blood pressure 162/97, and oxygen saturation 99% on room air.   White blood cells 6.5, hemoglobin 12.2, hematocrit 35.7, and platelets  290.  Sodium 140, potassium 4.3, chloride 104, bicarb 28, BUN 11,  creatinine 0.88, glucose 263, and calcium 8.7.      Myrtis Ser, MD  Electronically Signed      Felicity Pellegrini, MD     CW/MEDQ  D:  06/07/2008  T:  06/08/2008  Job:  [id number]   cc:   Ludwig Lean, MD

## 2010-05-19 NOTE — Op Note (Signed)
NAMENOELLE, [name]                 ACCOUNT NO.:  [id number]   MEDICAL RECORD NO.:  [id number]          PATIENT TYPE:  INP   LOCATION:  5014                         FACILITY:  [facility]   PHYSICIAN:  Dahlia Bailiff, MD    DATE OF BIRTH:  07/15/1960   DATE OF PROCEDURE:  DATE OF DISCHARGE:                               OPERATIVE REPORT   PREOPERATIVE DIAGNOSIS:  Osteomyelitis with wet gangrene of the left  great toe.   POSTOPERATIVE DIAGNOSIS:  Osteomyelitis with wet gangrene of the left  great toe.   OPERATIVE PROCEDURE:  Amputation of the left great toe.   COMPLICATIONS:  None.   CONDITION:  Stable.   HISTORY:  This is a very pleasant 50 year old gentleman who presents  with at least a 37-month history of a significant draining ulceration on  the plantar aspect of his great toe.  There is significant swelling,  obvious ecchymosis and cellulitis of the forefoot.  As a result of this,  he was admitted and was consulted.  Because of the uncontrolled  diabetes, the patient had x-ray changes consistent with osteomyelitis.  I did consult the Vascular Service and based on ABI's, Vascular  recommended just his toe amputation.  I told him that because there is a  good blood supply, there is a chance we can heal this, but I was going  to leave it partially open for the healing secondary intention.  I also  indicated to him that if this fails or the infection progresses that we  have to return for another more proximal amputation.  The patient was  aware of all these risks and benefits and so consent was obtained.   OPERATIVE NOTE:  The patient was brought to the operating room, placed  supine on the operating table.  After successful induction of a block  with sedation, the left lower extremity, which was identified  preoperatively was prepped and draped in standard fashion.  Appropriate  time-out was done.  The amputation was made with a racquetball-type  incision.  The incision began on  the medial side, because of the quality  of the skin, I needed to make the skin incision just proximal to the MTP  articulation.  I then carried it into the first web space.  I then  disarticulated the MTP joint as the proximal phalanx did show  significant ostia changes.  At this point, I then resected the  metatarsal head in order to allow for appropriate wound coverage.  I  also resected the medial and lateral sesamoids.  At this point, I had  excellent bleeding bone.  There was no purulent material noted.  With  satisfactory amputation site, I then loosely reapproximated the  amputation site with #1 Prolene sutures.  I placed a Penrose drain deep  for drainage.  I then placed a bulky dry dressing.  I only had the  tourniquet for about 9 minutes and prior to closing, I did deflate it in  order to obtain hemostasis.   The patient tolerated this procedure well.  After applying a bulky  dressing, he  was transferred to the PACU without incident.  At the end  of the case, all needle and sponge counts were correct.   FIRST ASSISTANT:  Karen Kays, PA      Dahlia Bailiff, MD  Electronically Signed     DDB/MEDQ  D:  02/28/2008  T:  02/29/2008  Job:  [id number]

## 2010-05-19 NOTE — Assessment & Plan Note (Signed)
[facility]   NAMEKEDUS, DROUHARD                 ACCOUNT NO.:  [id number]   MEDICAL RECORD NO.:  [id number]      DATE OF BIRTH:  05-10-1960   PHYSICIAN:  Kathrin Penner, M.D.    VISIT DATE:  06/11/2008                                   OFFICE VISIT   PROBLEMS:  1. Osteomyelitis of the left great toe status post ray amputation with      nonhealing wound in this type 2 diabetic man.  This wound has been      healing well with the patient's wound care and with the help of the      [place].  He has an Apligraf applied to this      wound in April 2010 and the wound is now healed to the point where      we may be able to discontinue care today.  2. New left lateral foot wound.  The patient went down to that [facility] and did significant walking around in a pair of boots and did      not feel any pain due to his neuropathic foot and developed a large      bullous area over the lateral aspect of the left foot.  On his      return to [facility], he was hospitalized and given intravenous      antibiotics and discharged recently and returned to the [facility].  This wound occupies a full-thickness skin on the lateral      aspect of the foot close to the fifth metatarsophalangeal joint and      extends onto the plantar surface of the foot with a small abscess      at the plantar surface of approximately the third      metatarsophalangeal joint.   PHYSICAL EXAMINATION:  VITAL SIGNS:  The patient presents today.  He is  afebrile.  He is not in any pain.  His temperature is 98.7, pulse 89,  respirations 20, blood pressure 171/109 with a capillary blood glucose  of 147.  The lateral foot has a full-thickness skin over the new ulcer and the  ulcer area is debrided such that ulcer measurements prior to debridement  were 2.6 x 2.0 x 2.0, following debridement the area is extended to 8.0  x 4.0 x 0.2.  On the most lateral portion of the foot there  are fairly  clean granulations to be seen on the plantar surface.  There is an  abscess with a hole which on probing does not go to the bone.  This  constitutes a Wagner 3 ulcer.   TREATMENT AND PLAN:  Treatment today will also include Silverlon with  hydrogel to the lateral foot and into the plantar ulcer.  The area of  the ray amputation of the left great toe is now completely healed and we  can peel this out.  I will have to keep the patient out of work for  another couple of weeks pending healing of his lateral foot and we will  treat him with a Darco offloading shoe with a welt felt wedge, so as to  keep pressure off the  front portion of the foot.  We will plan to see  the patient back again in 1 week.  We will continue him on doxycycline  100 mg b.i.d. and we will schedule him to have [facility].  He had been seen by [place] in the past and we  will continue that.      Kathrin Penner, M.D.  Electronically Signed     PB/MEDQ  D:  06/11/2008  T:  06/12/2008  Job:  [id number]

## 2010-05-19 NOTE — Assessment & Plan Note (Signed)
[facility]   NAMEMarland Kitchen  [name], [name]                 ACCOUNT NO.:  [id number]   MEDICAL RECORD NO.:  [id number]      DATE OF BIRTH:  12-26-1960   PHYSICIAN:  Ricard Dillon, M.D. VISIT DATE:  05/02/2008                                   OFFICE VISIT   [name] is a gentleman with a left first toe ray amputation for  osteomyelitis.  He is a diabetic on insulin and oral agents.  He has  been left with a surgical wound nonhealing.  Last week when I saw him I  applied an Apligraf, Mepitel, hydrogel, and a clean gauze and clean  wrap.  The dressing is being changed by [place] down to Mepitel.   On examination today, he returns in followup.  He is afebrile.  Although  we do not usually disturb the Mepitel here, it was already down to  expose half the wound.  Therefore, we removed it replaced it.  The wound  certainly looks smaller.  The linear crevice in the base of this wound  appeared to be better approximated.  We will readdress this with Mepitel  and gauze and return next week.   IMPRESSION:  Wagner grade 3 diabetic foot wound status post surgery.  We  have reapplied his Mepitel.  We will review this again next week.           ______________________________  Ricard Dillon, M.D.     MGR/MEDQ  D:  05/02/2008  T:  05/03/2008  Job:  [id number]
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
## 2010-05-19 NOTE — Assessment & Plan Note (Signed)
[facility]   NAMEMarland Kitchen  COLETIN, SIGRIST                 ACCOUNT NO.:  [id number]   MEDICAL RECORD NO.:  [id number]      DATE OF BIRTH:  10-22-1960   PHYSICIAN:  Ricard Dillon, M.D. VISIT DATE:  03/28/2008                                   OFFICE VISIT   HISTORY:  Mr. Fuhrmann is a gentleman who underwent a left first toe ray  amputation for osteomyelitis of that toe.  He is a diabetic on insulin  and oral agents (type 2).  He has been left with a surgical wound that  is not healing.  The original surgery I believe was in February.  He  remains on IV antibiotics and has been receiving Silverlon to the wound  through [facility].  He did not complain of pain, fever, or  excessive drainage.   PHYSICAL EXAMINATION:  The wound measures 3.4 x 1.6 x 2, that is not too  much different from last time.  The base of this is clean.  There is  still some suture in place medial to the wound itself.  I did not  disturb this.  The wound was debrided of some circumferential  surrounding callus from the wound edges.  This was carefully done  without any undue difficulties.   IMPRESSION:  Wegener grade 3 diabetic foot wound now status post  surgery.  I have continued the Silverlon, hydrogel, dry dressing, Ace  wrapped, and a healing sandal orders through [facility].  The  patient would be a candidate for Apligraf and/or hyperbaric oxygen  should this area stall or regress.           ______________________________  Ricard Dillon, M.D.     MGR/MEDQ  D:  03/28/2008  T:  03/29/2008  Job:  [id number]

## 2010-05-19 NOTE — Discharge Summary (Signed)
[name], [name]                 ACCOUNT NO.:  [id number]   MEDICAL RECORD NO.:  [id number]          PATIENT TYPE:  INP   LOCATION:  [id number]                         FACILITY:  [facility]   PHYSICIAN:  [name], M.D. DATE OF BIRTH:  1960/12/26   DATE OF ADMISSION:  02/26/2008  DATE OF DISCHARGE:  03/01/2008                               DISCHARGE SUMMARY   DISCHARGE DIAGNOSES:  1. Left great toe osteomyelitis, status post amputation.  2. Type 2 diabetes mellitus, uncontrolled.  3. Hypertension.  4. Newly diagnosed hyperlipidemia.   DISCHARGE MEDICATIONS:  1. Ertapenem 1 g IV daily through PICC line for a total of 6 weeks      since day of discharge.  2. Aspirin 81 mg daily.  3. Glipizide 10 mg twice daily.  4. Lisinopril 20 mg daily.  5. Zocor 20 mg at bedtime.  6. Metformin 500 mg twice daily.  7. Lantus insulin 15 units injected subcutaneously at bedtime.   DISPOSITION AND FOLLOWUP:  [name] is discharged home in stable  condition.  He does not yet have a primary care physician; however, I  have asked Case Management to provide him with a phone number to call  for physicians who are now accepting new patients.  He has been  instructed to make his upon within the next 2 weeks as he will need a  close control of his diabetes to assist with wound healing.  He has also  been newly started on a statin for hyperlipidemia.  I will need a repeat  fasting lipid profile as well as a CMET within the 8-12 weeks.  He also  has a followup appointment with Dr. Rolena [name], who performed his surgery on  Friday, March 08, 2008, at 3:15 p.m.  Dr. Windy [name] office will also call  him on Monday to set up an appointment.  He also has an appointment to  follow up at the [facility] on March 13, 2008, at 8 in the  morning.   CONSULTATION THIS HOSPITALIZATION:  1. [name] Bailiff, MD, with Orthopedics.  2. [name] Murray, MD, with Infectious Disease.   IMAGES AND PROCEDURES PERFORMED  DURING THIS HOSPITALIZATION:  1. A left foot x-ray on February 26, 2008, that was suspicious for      osteomyelitis affecting the distal aspect of the proximal phalanx      of the great toe.  2. He had an MRI of his left lower extremity on February 27, 2008 that      showed osteomyelitis of the proximal and distal phalanges of the      great toe with extensive surrounding subcutaneous edema and      enhancement.  Low-level edema and enhancement scattered in the hind      foot and mid foot potentially representing stress reaction or early      osteomyelitis, but considering more striking in the findings in the      great toe.  Cellulitis of the foot with edema and enhancement along      the plantar musculature suggesting mild myositis/fasciitis.  Flexor  hallucis longus tenosynovitis.  Tendinopathy of the peroneus longus      as it extends beneath the cuboid.  3. A chest x-ray on February 29, 2008 that shows that a right PICC is      in the mid right atrium that should be pulled back 3.5 cm from      appropriate positioning in the distal SVC.   HISTORY AND PHYSICAL EXAMINATION:  For full details, please refer to  history and physical dictated by Dr. Jonelle [name] on February 26, 2008, but in  brief, [name] is a very pleasant 50 year old African American man,  who has not seen a physician in over a year.  He lost his insurance in  January 2010 and just got it back.  For that reason, he has neglected  his diabetes, not taking his medicine consistently, and not being seen  by any physician.  In January, he noticed something like a blister  beneath his big toe on the left and another blister has turned into a  very big wound and because he was concerned, he decided to come into the  emergency room.  His left foot was also becoming swollen and red and  tender.  Because of this, he was brought into the hospital for further  evaluation and management especially given the results of his MRI that   looked like osteomyelitis.   HOSPITAL COURSE:  1. Left great toe osteomyelitis.  An orthopedic consultation in the      name of Dr. Rolena [name] was obtained, who had Vascular Surgery see him      for recommendations regarding wound healing.  They have performed      ABIs that have been greater than 1 demonstrating good blood flow.      He has proceeded with amputation of the left big toe.  He was on      vancomycin and Zosyn while in the hospital.  Cultures from this      have grown methicillin-sensitive Staph aureus, and Dr. Orene [name] has      suggested that he go home on ertapenem for ease of dosing 1 g      daily, and this has been arranged with a home health agency to do      these antibiotics for a total of 6 weeks.  We have decided to do      the 6 weeks of antibiotics given the fact that there could be some      residual osteomyelitis in the foot.  2. For his diabetes, which is uncontrolled, he had not been on      medications for at least a year.  It seems his CBGs have been in      the 200s to 300s initially while in the hospital, he was started on      Lantus as well as glipizide, and upon discharge, I will also add      metformin.  He does not currently have a primary care physician,      although I have instructed with him the importance of setting up an      appointment with one as management of his diabetes is going to be      the one thing that will prevent further diabetic foot ulcers from      appearing.  He has promised that he will be compliant with his      medications and that he will procure this appointment as soon as he  is discharged from the hospital.  3. Newly diagnosed hypertension.  He was started on lisinopril given      his diabetes 20 mg daily.  He should be further assessed as an      outpatient to see how his blood pressure is doing as well as he      should probably get a BMET in 2-4 weeks to assess his potassium and      his renal function.  4. For his  newly diagnosed hyperlipidemia with a fasting lipid profile      as follows; total cholesterol 173, triglycerides 58, HDL 33, and an      LDL of 128.  The goal LDL in a diabetic should be below 70, hence      we have started him on a statin, Zocor 20 mg daily.  In 8-12 weeks,      he needs a repeat fasting lipid profile as well as a CMET to follow      his liver functions on the newly started statin.   VITAL SIGNS ON THE DAY OF DISCHARGE:  Blood pressure 144/94, heart rate  97, respirations 18, O2 sats 99% on room air, and temperature of 97.6.      [name], M.D.  Electronically Signed     EH/MEDQ  D:  03/01/2008  T:  03/02/2008  Job:  [id number]   cc:   [name] Bailiff, MD  [name], M.D.

## 2010-05-19 NOTE — Assessment & Plan Note (Signed)
[facility]   [name], [name]                 ACCOUNT NO.:  [id number]   MEDICAL RECORD NO.:  [id number]      DATE OF BIRTH:  Feb 19, 1960   PHYSICIAN:  Orlando Penner. Sevier, M.D.  VISIT DATE:  07/17/2008                                   OFFICE VISIT   HISTORY:  This 50 year old black male with diabetes is being treated for  a plantar ulcer underlying the fifth metatarsal head on the left foot  where he has had a resection of the hallux.  The patient arrives today  in a tennis shoe having been supposed to be in a Darco platform walker,  but he insists that the reason for that is that he had several doctors  appointments today and needed to be able to move around more quickly.  Also, his ulcer is somewhat larger than what was recorded on last visit  but he insists that it was not remeasured last visit after it was  debrided and that this may not be reliable.  He is oblivious to any  particular concern about his foot and seems satisfied with the way  things are going.   PHYSICAL EXAMINATION:  VITAL SIGNS:  Blood pressure is 194/109, which is  called to the patient's attention and with the advice that he have this  checked by his primary physician, his pulse is 99, his respirations 21,  his temperature 98.6, and his random blood glucose 117 mg/dL.   The ulcer underlying the fifth metatarsal head on the plantar aspect of  his left foot measures 1.4 x 1.0 x 0.2 cm and is partially covered with  a loose fibrinogelatinous slough and also has considerable callus  formation around its margins.   Status quo with possible slight regression in diabetic foot ulcer,  Wagner stage II, plantar left foot.   DISPOSITION:  The wound is debrided selectively of this slough as  described and also of the surrounding callus and some loose skin at the  wound margins.  It bleeds somewhat and this is cauterized with silver  nitrate.   The wound is then dressed with Prisma, Sofsorb pad  and he is advised to  return to his Darco walker.  He will change the dressing at home every 3  days and will be seen here again in followup in 1 week.           ______________________________  Orlando Penner. London Pepper, M.D.     RES/MEDQ  D:  07/17/2008  T:  07/18/2008  Job:  [id number]

## 2010-05-19 NOTE — Assessment & Plan Note (Signed)
[facility]   [name], [name]                 ACCOUNT NO.:  [id number]   MEDICAL RECORD NO.:  [id number]      DATE OF BIRTH:  Apr 13, 1960   PHYSICIAN:  Orlando Penner. Sevier, M.D.  VISIT DATE:  08/21/2008                                   OFFICE VISIT   HISTORY:  This 50 year old black male with diabetic neuropathy and  lateral fifth metatarsal head ulcer on his left foot is seen in routine  followup.  He has shown progressive improvement since beginning  treatment here and fortunately has been able to continue in his job,  which requires nearly 100% standing.  He reports no problems since his  last visit.   On examination, blood pressure 164/105, pulse 105, respirations 18,  temperature 98.8.  Random blood glucose 128 mg/dL.  The ulcer itself now  on the lateral aspect of his foot underlying the fifth metatarsal head  measures 0.8 x 0.8 x 0.1 cm.  The margins are well stuck with evidence  of epithelial advancement and with a small amount of surrounding callus.   IMPRESSION:  Continued progress and near healing of left lateral plantar  diabetic foot ulcer.   DISPOSITION:  The wound today is selectively debrided of the small  amount of callus that surrounds it and is then redressed with an  application of Silverlon, hydrogel, and a SofSorb pad.  He will change  this at home on a q.2 day basis and will be seen again in followup here  in 2 weeks.           ______________________________  Orlando Penner London Pepper, M.D.     RES/MEDQ  D:  08/21/2008  T:  08/22/2008  Job:  [id number]

## 2010-05-19 NOTE — Assessment & Plan Note (Signed)
[facility]   NAMEJOHNJAMES, [name]                 ACCOUNT NO.:  [id number]   MEDICAL RECORD NO.:  [id number]      DATE OF BIRTH:  10-10-1960   PHYSICIAN:  Orlando Penner. Sevier, M.D.  VISIT DATE:  08/07/2008                                   OFFICE VISIT   HISTORY:  This 50 year old black male is seen for followup for lateral  diabetic foot ulcer at the fifth metatarsal head area on his left foot.  He has some foot deformity and has recently had custom insoles made for  his work boots.  In addition, last week we had posted him with a felt  pad, which he said was well tolerated even in association with the  insert.  He has brought those inserts and boots today for our evaluation  as requested.  He has had no new problems.  He is working heavily on his  feet most of the time and has accumulated within the past week or so 72  hours of overtime, so he is putting [place] to the challenge.   PHYSICAL EXAMINATION:  Blood pressure is 176/104 and this was pointed  out to the patient and he will address it with his primary physician,  pulse is 110, respirations 18, temperature 97.8.  The ulcer on the left  lateral foot is not penetrating and measures 0.9 x 1.0 x 0.1 cm and has  a relatively clean base except for a little what appears to be slough in  1 limited area.  There is some surrounding callus persisting.   IMPRESSION:  Continued improvement with about 40-50% reduction in  overall area of this wound over the past week.   DISPOSITION:  The patient's new inserts are evaluated.  They are not  nearly custom in nature as I had hoped that it might be, but they appear  to be reasonably adequate.   The wound itself is debrided of the skin margins right at the edge of  the wound in hopes of stimulating additional epithelialization.  In  addition, some of the adjacent calluses again reduced.   He will be treated with an application of Silverlon together with small  amount of  hydrogel and this will be covered with an Allevyn pad and the  pad to extend up onto the lateral and slight dorsal aspects of his foot  as well.   He is given two extra pads for use at home, invited to consult the  pharmacy and the pharmacist to get a similar product that he can obtain  over-the-counter and use to give him a little effective padding and  posting around the area of this active ulcer.   He will change the dressing every 2 days at home and cleansing his foot  at each dressing change.   His followup visit will be here in 2 weeks.           ______________________________  Orlando Penner London Pepper, M.D.     RES/MEDQ  D:  08/07/2008  T:  08/08/2008  Job:  [id number]

## 2010-05-19 NOTE — Assessment & Plan Note (Signed)
[facility]   NAME:  [name], [name] NO.:  [id number]   MEDICAL RECORD NO.:  [id number]      DATE OF BIRTH:  08/01/1960   PHYSICIAN:  Judene Companion, M.D.           VISIT DATE:                                   OFFICE VISIT   He has a diabetic ulcer on the lateral side of his left foot that has  been treated with hydrogel dressings daily with Allevyn dressings  applied.  He is also on doxycycline 100 mg b.i.d.  He has been out of  work now for several months and feels like he can go back to work.  The  ulcer is probably 2 cm in length and 3 mm wide, and it is very clean and  is showing signs of epithelialization, and I thought he could go back to  work as long as he continued to take care of his diabetes, and he  already has diabetic boot ordered, and that will be here in a few days.  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  He has excellent pedal pulses, and  he changes his dressing himself every day, so with the fact that he has  got no infection and it is starting to epithelialize and he really wants  to get back to work, I told him that he could go on back as long as he  does hydrogel dressing daily and wears the boot, and he will come back  here in a couple of weeks, and we will check it to make sure it is going  on to heal and it would not be bothered by his return to work.      Judene Companion, M.D.     PP/MEDQ  D:  06/18/2008  T:  06/19/2008  Job:  [id number]

## 2010-05-19 NOTE — Assessment & Plan Note (Signed)
[facility]   NAMEMarland [name]  KEILAN, BADDERS                 ACCOUNT NO.:  [id number]   MEDICAL RECORD NO.:  [id number]      DATE OF BIRTH:  1960/06/24   PHYSICIAN:  Ricard Dillon, M.D.      VISIT DATE:                                   OFFICE VISIT   [name] is a gentleman who underwent a left first toe ray amputation  for osteomyelitis of that toe.  He is a diabetic, on insulin and oral  agents.  He has been left with a surgical wound that is nonhealing.  The  original surgery I believe was in February.  He has been treated with IV  antibiotics and most recently has been applying Silverlon to the wound  through the [facility].  He does not complain of pain, fever,  or excessive drainage.   On examination, he is afebrile with a temperature of 98.3.  The wound  measures 2.3 x 1 x 0.1 versus 2.4 x 1.4 x 0.1 the last time.  The base  of this is reasonably clean.  At the inferior edges, there was some  surrounding thickened callus that had a linear crevice through the base  of this.  This was at the inferior recess of the circular wound.  I did  an excisional debridement to remove this area to flatten the surface of  the wound and remove the crevice.  My plan at this date would be to  ultimately apply an Apligraf to this wound in the next week or two.  There was no evidence of surrounding infection.   IMPRESSION:  Wagner's grade 3 diabetic foot wound, now status post  surgery and on antibiotics.  I have continued the Silverlon, hydrogel,  dry dressing, Ace wrap, to be changed by [facility].  I have  asked for trial approval of an Apligraf through [facility].           ______________________________  Ricard Dillon, M.D.     MGR/MEDQ  D:  04/18/2008  T:  04/19/2008  Job:  [id number]

## 2010-05-19 NOTE — H&P (Signed)
NAMEGRANTHAM, BIXLER                 ACCOUNT NO.:  [id number]   MEDICAL RECORD NO.:  [id number]          PATIENT TYPE:  EMS   LOCATION:  MAJO                         FACILITY:  [facility]   PHYSICIAN:  Barbette Merino, M.D.      DATE OF BIRTH:  23-Dec-1960   DATE OF ADMISSION:  02/26/2008  DATE OF DISCHARGE:                              HISTORY & PHYSICAL   PRIMARY CARE PHYSICIAN:  The patient is unassigned.   PRESENTING COMPLAINT:  Left foot pain and ulcer.   HISTORY OF PRESENT ILLNESS:  The patient is a 50 year old gentleman with  diabetes who has not seen a doctor over a year.  The patient apparently  has been having problem with his child support so he had no money to  continue his care.  He lost his insurance until January 2010 when he got  it back.  For that reason, the patient has neglected his diabetes not  taking medicine consistently and not seen any physician.  In January  2010, he noticed something like a blister below his big toe on the left  foot.  Per the patient, he did not feel any pain.  He did not feel any  symptoms so he neglected it until now.  Now the blister has turned into  a very big brown wound and he is worried so he decided to come to the  emergency room.  He has also noticed that his left foot is becoming  swollen and red and tender now.  He has had some discharge there but not  consistent.  His initial evaluation in the ED show the patient has what  appears to be osteomyelitis hence he is being admitted.   PAST MEDICAL HISTORY:  Significant for mainly diabetes.   ALLERGIES:  He has no known drug allergies.   MEDICATIONS:  The patient was previously on Glucophage and some other  p.o. but has not been taking medicine because of swelling and he is  currently on no medications.   SOCIAL HISTORY:  The patient is single.  He has a child for which he  pays a lot of child support.  He quit smoking about 2 years ago.  Drinks  socially.  Denied any IV drug use.   FAMILY HISTORY:  Significant for mainly diabetes.   REVIEW OF SYSTEMS:  Fourteen-point review of systems is negative except  per HPI.   PHYSICAL EXAMINATION:  VITAL SIGNS:  Temperature is 99.2, blood pressure  161/94, pulse of 30, respiratory rate 18, sats 100% on room air.  GENERAL:  He is awake, alert, oriented in no acute distress but looks  worried.  HEENT:  PERRL.  EOMI.  NECK:  Supple.  No JVD, no lymphadenopathy.  RESPIRATORY:  He has good air entry bilaterally.  No wheezes, no rales.  CARDIOVASCULAR SYSTEM:  The patient is tachycardic.  ABDOMEN:  Soft, nontender with positive bowel sounds.  EXTREMITIES:  No edema, cyanosis or clubbing.  Left lower extremity  showed an ulcer around at about a quarter size below his big toe.  No  apparent discharge.  LABORATORY DATA:  White count is 10.8 with a left shift, ANC of 9.1,  hemoglobin 14.4, platelet 312.  Sodium 134, potassium 4.2, chloride 98,  CO2 28, glucose 287, BUN 10, creatinine 0.89, calcium 9.1.  The x-ray of  the left foot complete showed findings that are suspicious for  osteomyelitis affecting the distal aspect of the proximal phalanx of the  great toe.   ASSESSMENT:  Therefore, this is a 50 year old gentleman presenting with  what appears to be diabetic foot ulcer with osteomyelitis.   PLAN:  1. Osteomyelitis.  We will admit the patient and start him on      vancomycin and Zosyn.  Get blood cultures, wound culture and get      orthopedic consult.  2. Diabetic foot ulcer.  Again, we will get wound care as well as      Ortho consult.  The patient may end up getting amputation of his      left big toe.  3. Diabetes.  This is on control and the patient is not taking any      medications.  We will hydrate him, start him on subcu Lantus and      sliding scale insulin while in the hospital.  We will transition      him to probably oral agents depending on how he does in the      hospital.  The patient is not likely to be  compliant with insulin      at this point.  4. Hypertension.  The patient denied any prior diagnosis of      hypertension but blood pressure is elevated now.  In the setting of      diabetes, his systolic needs to be less than 130/80.  We will      therefore start him on low-dose lisinopril and continue to monitor.  5. Diabetic neuropathy.  The patient needs to have full counseling on      his insulin and diabetes.  He is already loss of sensation in his      lower extremities, indicating some early neuropathy.  We will      advise him at the end of these followup in the clinic as well as      giving lectures on foot exam.  6. Medication noncompliance.  Again, the patient will need further      counseling on the need for him to comply with his medication      especially now that he has got a new insurance which will enable      him to see physicians promptly.      Barbette Merino, M.D.  Electronically Signed     LG/MEDQ  D:  02/26/2008  T:  02/27/2008  Job:  [id number]

## 2010-05-19 NOTE — Assessment & Plan Note (Signed)
[facility]   NAMEMarland Kitchen  [name], [name]                 ACCOUNT NO.:  [id number]   MEDICAL RECORD NO.:  [id number]      DATE OF BIRTH:  1960-12-18   PHYSICIAN:  Elesa Hacker, M.D.         VISIT DATE:  07/10/2008                                   OFFICE VISIT   HISTORY OF PRESENT ILLNESS:  This is a 50 year old male diabetic for  about 8 years with an ulcer of the plantar lateral surface of the left  foot.   Physical exam today reveals the temperature 98.6, pulse 100,  respirations 20, blood pressure 170/90.  The ulcer is 0.7 x 0.8.  This  represents some improvement according to the patient.  The wound is  covered by a very thick callus, which is sharply debrided with a  scalpel.  There is no bleeding.   IMPRESSION:  I believe the problem here is weightbearing.  We will treat  him with foam to prevent any weight on the wound, in addition to  Promogran and hydrogel.  See him in 7 days.  Consider total contact  casting.      Elesa Hacker, M.D.  Electronically Signed     RA/MEDQ  D:  07/10/2008  T:  07/11/2008  Job:  [id number]

## 2010-05-19 NOTE — Assessment & Plan Note (Signed)
[facility]   NAMEMarland Kitchen  [name], [name]                 ACCOUNT NO.:  [id number]   MEDICAL RECORD NO.:  [id number]      DATE OF BIRTH:  1960/08/18   PHYSICIAN:  Kathrin Penner, M.D.    VISIT DATE:  07/02/2008                                   OFFICE VISIT   PROBLEM:  Diabetic foot ulcer in this 50 year old man current ulcer  dimensions of 0.5 x 0.7 x 0.5.  This ulcer is located on the plantar  side of the foot at the fifth metacarpophalangeal joint area.  The  patient has been undergoing treatment here at the [facility] with  offloading and with Silverlon and hydrogel.   On examination today, the patient's temperature is 98.5, pulse 103,  respirations 16, blood pressure 146/89, capillary blood glucose is 140.  The base of the wound is clean and granulating.  There is some callus  formation around the wound.  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  He  does say that he removes the boots at ongoing home and does put his  offloading boot on.  We will continue treatment of the patient on this  basis either say that he has an order diabetic offloading shoes which he  should be able to wear at work.   Treatment today, Promogran and hydrogel with offloading of the left  lateral foot.  This dressing should be changed every 3 days.  We will  follow up with the patient in 1 week.      Kathrin Penner, M.D.  Electronically Signed     PB/MEDQ  D:  07/02/2008  T:  07/03/2008  Job:  [id number]

## 2010-05-19 NOTE — Assessment & Plan Note (Signed)
[facility]   NAMEMarland [name]  ODAI, HUTZELL                 ACCOUNT NO.:  [id number]   MEDICAL RECORD NO.:  [id number]      DATE OF BIRTH:  August 06, 1960   PHYSICIAN:  Elesa Hacker, M.D.         VISIT DATE:  07/24/2008                                   OFFICE VISIT   HISTORY OF PRESENT ILLNESS:  A 50 year old male with diabetes who has  been treated for plantar ulcer underlying the fifth metatarsal head.   PHYSICAL EXAMINATION:  VITAL SIGNS:  Afebrile.  Blood pressure is  190/102.  He has been warned about this and told to see his regular  doctor.  His pulse is 100.  WOUND:  The wound is 1 x 1.5 x 0.2, essentially unchanged from previous  visits, a great deal of callus is sharply excised with essentially no  bleeding.   PLAN:  Treat with Prisma, SofSorb gauze.  He is wearing a Doctor, general practice.  I  will see him in 7 days.  He may require hyperbaric oxygen.      Elesa Hacker, M.D.  Electronically Signed     RA/MEDQ  D:  07/24/2008  T:  07/25/2008  Job:  [id number]

## 2010-05-19 NOTE — Assessment & Plan Note (Signed)
[facility]   [name], [name]                 ACCOUNT NO.:  [id number]   MEDICAL RECORD NO.:  [id number]      DATE OF BIRTH:  08/22/1960   PHYSICIAN:  Orlando Penner. Sevier, M.D.  VISIT DATE:  07/31/2008                                   OFFICE VISIT   HISTORY:  This 50 year old black male with diabetic foot ulcer is seen  in followup.  This ulcer is located under the fifth metatarsal head on  the left and has been slow to progress with our traditional treatments.  He has no known underlying significant vascular disease other than the  fact that he has diabetes and he specifically has had cultures which  have shown no predominant organisms, etc.  X-ray has shown no acute bony  abnormalities to suggest osteomyelitis or myositis.   The patient reports that he has been wearing his custom made boots with  custom inserts at work.  These apparently come up to the lower calf area  and he has noted some pain above that area in the calf.  He has not  noted swelling and has had no pain when he walks.  This is just a dull  pain that is there essentially all the time when he wears those boots.   He arrives today in a set of sneakers with a over-the-counter type  insert which is too big for the shoe and is wrinkled almost in the area  of the wound itself.   PHYSICAL EXAMINATION:  VITAL SIGNS:  Blood pressure is 169/100, the  patient insists that he is continuing to work with his doctor on this,  his pulse is 104, respirations 20, temperature 98.3, and glucose 115  mg/dL.  The wound itself is 1.2 x 1.5 x 0.1 cm which is essentially unchanged.  It is reasonably clean.  It does not have an odor and no significant  drainage.  There is some slight surrounding callus.   IMPRESSION:  Somewhat stalled diabetic foot ulcer, likely multiple  reasons to account for this.   DISPOSITION:  The wound is debrided sharply with a scalpel to get rid of  adjacent callus and also to roughen  the wound edges themselves in hopes  we can stimulate further healing.   It is then dressed with an application of Prisma, hydrogel, and a large  Band-Aid.  The wound is posted proximal to the wound with a felt pad  held in place with tincture of benzoin.   His insert for his sneakers is cut so as to fit the shoe properly.   The patient will change his dressings at home on a q. 2-day basis.  He  is told to remove the felt pad from his foot if it seems to crawl things  inside his custom boot at all (and it may well do so since that is  molded to his foot and currently is in just with the flat insole).   He is to return in followup in 1 week and is to bring his custom boots  with him at that time for our evaluation.           ______________________________  Orlando Penner London Pepper, M.D.     RES/MEDQ  D:  07/31/2008  T:  08/01/2008  Job:  [id number]

## 2010-05-19 NOTE — Assessment & Plan Note (Signed)
[facility]   NAMEMarland Kitchen  VITALIY, DINGLE                 ACCOUNT NO.:  [id number]   MEDICAL RECORD NO.:  [id number]      DATE OF BIRTH:  November 13, 1960   PHYSICIAN:  Ricard Dillon, M.D. VISIT DATE:  04/25/2008                                   OFFICE VISIT   Mr. Mcaneny is a gentleman with a left first toe ray amputation for  osteomyelitis.  He is a diabetic on insulin and oral agents.  He has  been left with a surgical wound that is nonhealing.  The original  surgery was in February.  He received IV antibiotics and most recently  has been receiving Silverlon and this is being dressed by [facility].  He does not complain of pain, fever, or excessive drainage.   On examination, he is afebrile with a temperature of 98.3.  The wound  measures 1.7 x 0.9 x 0.1, slightly improved from last week.  At the  inferior edges of the wound, there is some surrounding thickened skin  that is a linear crevice through the base of this.  This all underwent a  selective debridement to remove the skin and subcutaneous tissues to  properly define the basis of this wound.  After debridement, we applied  an Apligraf to the entire wound surface.  There is no evidence of  surrounding infection.   IMPRESSION:  Wagner grade 3 diabetic wound status post surgery.  I have  applied an Apligraf, Mepitel, hydrogel, and a clean gauze and a clean  wrap.  The dressing can be changed by Arville Go down to the Mepitel.           ______________________________  Ricard Dillon, M.D.     MGR/MEDQ  D:  04/25/2008  T:  04/26/2008  Job:  [id number]

## 2010-05-22 NOTE — Op Note (Signed)
[facility]  Patient:    [name], [name]                          MRN: [id number] Adm. Date:  01/17/00 Attending:  Rob Hickman, M.D.                           Operative Report  INDICATIONS:  I had the privilege to see [name] in the emergency room at [facility] January 17, 1999, at approximately 1 a.m. due to an injury sustained on the job at [employer].  The patient is 50 years old.  He is right-hand dominant and sustained an injury to his right thumb in the form of a distal tip amputation with fracture about the distal phalanx when he had his thumb caught in a machine.  As the patient was working, he sustained this injury while performing a job function, he relates. I discussed this with him.  He notes immediate realization of his injury.  He states that he originally did not have significant pain but certainly is in a high-degree of pain at the present time.  He denies other injury.  He denies head, back, chest, or abdominal pain.  He is alert and oriented x4 and is appropriate and cooperative.  PAST MEDICAL HISTORY:  Non-insulin-dependent diabetes.  PAST SURGICAL HISTORY:  Knee arthroscopy, left elbow skin graft.  ALLERGIES: None.  MEDICATIONS:  Oral hypoglycemic.  SOCIAL HISTORY:  The patient smokes 10 cigarettes a day. He works at [employer].  PHYSICAL EXAMINATION:  Reveals a black male, alert and oriented and in no acute distress.  VITAL SIGNS:  Stable.  He is afebrile.  HEENT: Normal.  NECK/BACK/CHEST/ABDOMEN:  Nontender.  EXTREMITIES:  The left upper extremity is benign.  The right upper extremity has a distal thumb tip amputation.  The nail is near-avulsed.  The patient has an exposed distal phalanx comminuted fracture noted and a fillet flap of volar tissue.  The patient has intact DTL and FTL.  There is no instability. Capillary refill to the distal pulp is intact.  The patient has x-rays  which show distal phalanx fracture and the tip amputation.  IMPRESSION:  Open distal phalanx fracture, right thumb, with tip amputation and an elbow laceration which is jagged and very complex.  INDICATIONS:  I verbally consented him with the findings and repair of structures as necessary.  He desires to proceed.  DESCRIPTION OF PROCEDURE:  The patient was taken to the procedural area.  He was given a block about the thumb with 1% lidocaine without epinephrine by myself.  Following this, he was given a thorough 10-minute surgical Betadine scrub, followed by irrigation, Betadine painting, and sterile draping.  Once the sterile field was secured, the patient had the nail removed with a freer elevator after the tourniquet was applied.  Once this was done, the patient had undergone and I and D, including the skin and subcutaneous tissue, nail bed tissue, and bone.  The patient had jagged bone, and partial excision of nonviable and prenecrotic fragments was accomplished without difficulty.  The patient had copious irrigation applied to the wound at this time, and debridement occurred without problems.  Once the debridement was complete, the drape was changed, and the patient then underwent a nail bed repair with 7-0 Vicryl under 4.0 loupe magnification without difficulty.  Following this, the patient had the volar pulp  defatted, advanced, and trimmed appropriately.  It was able to be advanced in a volar advancement fashion to the nail bed.  This was inset with 4-0 chromic sutures.  This inset well, and following this, the tourniquet was deflated, and excellent refill was noted.  The patient did have reconstruction of the thumb with a volar flap.  This was an advancement-type flap in nature with defatting technique and contouring technique used.  The patient had an excellent overall look after this was performed, and I was very happy with the cosmetic look, as was the patient.  He tolerated  this well without difficulty.  Following this, the patient had a sterile dressing placed.  Eponychial adherence was prevented by placing Adaptic under the eponychial fold.  A Xeroform gauze splint and Coban were placed without difficulty.  He tolerated this well.  All sponge and instrument counts were reported as correct.  The patient was given a tetanus shot, as well as Ancef. He will return to see me in seven days for therapy and two weeks for formal office visit by myself.  I advised him to notify me should his condition worsen or any problems occur.  It has been a pleasure participating in his care, and I look forward to participating in his recovery.  He was given Keflex x1 week and Percocet.  He will not work for the next four days.  After four days, he will begin light duty and no use of the right upper extremity. DD:  01/17/00 TD:  01/17/00 Job: [id number]

## 2010-05-27 ENCOUNTER — Encounter: Payer: 59 | Admitting: Internal Medicine

## 2010-05-28 ENCOUNTER — Other Ambulatory Visit: Payer: Self-pay | Admitting: Internal Medicine

## 2010-07-23 NOTE — Progress Notes (Signed)
Addended by: Ebbie Latus on: 07/23/2010 10:55 AM   Modules accepted: Orders

## 2010-07-26 ENCOUNTER — Encounter: Payer: Self-pay | Admitting: Internal Medicine

## 2010-12-04 ENCOUNTER — Other Ambulatory Visit: Payer: Self-pay | Admitting: Internal Medicine

## 2011-05-05 DIAGNOSIS — M869 Osteomyelitis, unspecified: Secondary | ICD-10-CM

## 2011-05-05 HISTORY — DX: Osteomyelitis, unspecified: M86.9

## 2011-05-12 ENCOUNTER — Encounter: Payer: Self-pay | Admitting: Ophthalmology

## 2011-05-12 ENCOUNTER — Ambulatory Visit (INDEPENDENT_AMBULATORY_CARE_PROVIDER_SITE_OTHER): Payer: BC Managed Care – PPO | Admitting: Ophthalmology

## 2011-05-12 VITALS — BP 177/93 | HR 101 | Temp 98.7°F | Ht 72.0 in | Wt 184.3 lb

## 2011-05-12 DIAGNOSIS — Z5987 Material hardship due to limited financial resources, not elsewhere classified: Secondary | ICD-10-CM

## 2011-05-12 DIAGNOSIS — E11621 Type 2 diabetes mellitus with foot ulcer: Secondary | ICD-10-CM

## 2011-05-12 DIAGNOSIS — Z598 Other problems related to housing and economic circumstances: Secondary | ICD-10-CM

## 2011-05-12 DIAGNOSIS — I10 Essential (primary) hypertension: Secondary | ICD-10-CM

## 2011-05-12 DIAGNOSIS — E119 Type 2 diabetes mellitus without complications: Secondary | ICD-10-CM

## 2011-05-12 DIAGNOSIS — Z5989 Other problems related to housing and economic circumstances: Secondary | ICD-10-CM

## 2011-05-12 DIAGNOSIS — Z91120 Patient's intentional underdosing of medication regimen due to financial hardship: Secondary | ICD-10-CM

## 2011-05-12 DIAGNOSIS — L97509 Non-pressure chronic ulcer of other part of unspecified foot with unspecified severity: Secondary | ICD-10-CM

## 2011-05-12 DIAGNOSIS — Z79899 Other long term (current) drug therapy: Secondary | ICD-10-CM

## 2011-05-12 DIAGNOSIS — L97519 Non-pressure chronic ulcer of other part of right foot with unspecified severity: Secondary | ICD-10-CM

## 2011-05-12 DIAGNOSIS — E1169 Type 2 diabetes mellitus with other specified complication: Secondary | ICD-10-CM

## 2011-05-12 DIAGNOSIS — E785 Hyperlipidemia, unspecified: Secondary | ICD-10-CM

## 2011-05-12 LAB — LIPID PANEL: LDL Cholesterol: 146 mg/dL — ABNORMAL HIGH (ref 0–99)

## 2011-05-12 LAB — GLUCOSE, CAPILLARY: Glucose-Capillary: 387 mg/dL — ABNORMAL HIGH (ref 70–99)

## 2011-05-12 MED ORDER — SULFAMETHOXAZOLE-TRIMETHOPRIM 800-160 MG PO TABS: 1.0000 | ORAL_TABLET | Freq: Two times a day (BID) | ORAL | Status: DC

## 2011-05-12 MED ORDER — LISINOPRIL-HYDROCHLOROTHIAZIDE 20-25 MG PO TABS: 1.0000 | ORAL_TABLET | Freq: Every day | ORAL | Status: DC

## 2011-05-12 MED ORDER — INSULIN GLARGINE 100 UNIT/ML ~~LOC~~ SOLN: 30.0000 [IU] | Freq: Every day | SUBCUTANEOUS | Status: DC

## 2011-05-12 MED ORDER — CEPHALEXIN 500 MG PO CAPS: 500.0000 mg | ORAL_CAPSULE | Freq: Four times a day (QID) | ORAL | Status: DC

## 2011-05-12 MED ORDER — "INSULIN SYRINGE 28G X 1/2"" 1 ML MISC": 1.0000 | Freq: Every day | Status: DC

## 2011-05-12 MED ORDER — METFORMIN HCL 1000 MG PO TABS: 1000.0000 mg | ORAL_TABLET | Freq: Two times a day (BID) | ORAL | Status: DC

## 2011-05-12 NOTE — Patient Instructions (Signed)
-  please follow up with wound care and pick up 2 antibiotic pills, blood pressure pill, metformin (diabetes pill), lantus (insulin)

## 2011-05-12 NOTE — Assessment & Plan Note (Addendum)
Patient not taking any medications. Was last prescribed lisinopril and chlorthalidone 1 year ago. Started him on lisinopril/HCTZ since it will be a 4$ medication and only 1 pill. Should check BMET when he returns since this is new regimen for him.

## 2011-05-12 NOTE — Assessment & Plan Note (Signed)
Referred patient to [place] for help with the high child support he reports that he is paying. If he qualified for the MAP program, this would save him quite a bit of money.

## 2011-05-12 NOTE — Progress Notes (Signed)
Subjective:   Patient ID: [name] male   DOB: 02-08-1960 51 y.o.   MRN: [id number]  HPI: [name] is a 51 y.o. diabetic man with swollen foot after injury to the area after moving all of his things this weekend with boots on. He was in financial trouble after having to pay high monthly fees for child support and insurance premiums.  He lost his car and he moved out of his townhouse. He says that he wasn't able to get child support reduced based on the 700$ viagra he had listed on his medications even though he ever picked up the viagra. He is making about half of the wage he used to make, but he is paying the same rate of child support. Patient hasn't taken any of his blood pressure, diabetic medications since January due to cost.  -co-pays for 2 medications were 4$, but others were $40- lantus or $20  -Noticed ulcer on right baby toe since Saturday. Took some ibuprofen, has some tolerable throbbing pain. He is s/p amputation of left great toe in 2010 due to osteomyelitis. He received wound care at [facility] at that time. He says that he noticed that he had not worn diabetic foot insert after he started having pain in his right foot at the end of the day. Denies any purulent drainage, just bleeding and now clear drainage.  Past Medical History  Diagnosis Date  . Hyperlipidemia   . Hypertension   . Diabetes mellitus   . Osteomyelitis     left great toe, s/p amputation   Current Outpatient Prescriptions  Medication Sig Dispense Refill  . cephALEXin (KEFLEX) 500 MG capsule Take 1 capsule (500 mg total) by mouth 4 (four) times daily.  56 capsule  0  . insulin glargine (LANTUS) 100 UNIT/ML injection Inject 30 Units into the skin at bedtime.  10 mL  12  . Insulin Syringe-Needle U-100 (INSULIN SYRINGE 1CC/28G) 28G X 1/2" 1 ML MISC 1 each by Does not apply route at bedtime.  100 each  3  . lisinopril (PRINIVIL,ZESTRIL) 20 MG tablet Take 1 tablet (20 mg total) by mouth daily.  30 tablet  0   . lisinopril-hydrochlorothiazide (PRINZIDE,ZESTORETIC) 20-25 MG per tablet Take 1 tablet by mouth daily.  90 tablet  3  . metFORMIN (GLUCOPHAGE) 1000 MG tablet Take 1 tablet (1,000 mg total) by mouth 2 (two) times daily with a meal.  90 tablet  3  . pravastatin (PRAVACHOL) 40 MG tablet Take 40 mg by mouth daily.        [name] sulfamethoxazole-trimethoprim (BACTRIM DS,SEPTRA DS) 800-160 MG per tablet Take 1 tablet by mouth 2 (two) times daily.  28 tablet  0  . DISCONTD: metFORMIN (GLUCOPHAGE) 1000 MG tablet Take 1,000 mg by mouth 2 (two) times daily with a meal.         No family history on file. History   Social History  . Marital Status: Single    Spouse Name: N/A    Number of Children: N/A  . Years of Education: N/A   Social History Main Topics  . Smoking status: Former Smoker    Quit date: 04/15/2005  . Smokeless tobacco: None  . Alcohol Use: Yes     occasional  . Drug Use: No  . Sexually Active: None   Other Topics Concern  . None   Social History Narrative  . None   Objective:  Physical Exam: Filed Vitals:   05/12/11 0934  BP: 177/93  Pulse:  101  Temp: 98.7 F (37.1 C)  TempSrc: Oral  Height: 6' (1.829 m)  Weight: 184 lb 4.8 oz (83.598 kg)  SpO2: 100%   General: smiling man that appears younger than stated age, sitting in chair HEENT: PERRL, EOMI, no scleral icterus Ext: warm and well perfused, no pedal edema, not able to assess pulse on R foot due to edema Foot: right 5th toe has area of shallow pink ulceration that is 0.7cm by 1.0 cm over lateral and superior surface. Clear straw colored exudate present on gauze dressing. Macerated appearing skin on superior edge of wound. Neuro: alert and oriented X3, cranial nerves II-XII grossly intact  Assessment & Plan:

## 2011-05-12 NOTE — Assessment & Plan Note (Addendum)
Patient has a stage 1-2 diabetic foot ulcer. Referred patient back to wound clinic and they can see him in one week, next Thursday. Prescribed him cephalexin & bactrim to give him MRSA coverage. Asked him to return next week so we can keep a close eye on this issue. Advised him that having his blood sugar in reasonable control is necessary for this foot ulcer to heal.

## 2011-05-12 NOTE — Assessment & Plan Note (Addendum)
Patient is off all medications for several months and A1c is greater than 14.0. He says he will be more able to afford medications now that he has moved out of his townhouse. Put him back on metformin and prescribed lantus since this will be easier to be compliant with than NPH. He is not concerned about the slightly higher co-pay. Can ask him about a meter and test strips at next visit.

## 2011-05-18 ENCOUNTER — Ambulatory Visit (INDEPENDENT_AMBULATORY_CARE_PROVIDER_SITE_OTHER): Payer: BC Managed Care – PPO | Admitting: Internal Medicine

## 2011-05-18 ENCOUNTER — Inpatient Hospital Stay (HOSPITAL_COMMUNITY)
Admission: AD | Admit: 2011-05-18 | Discharge: 2011-05-24 | DRG: 294 | Disposition: A | Discharge status: Home / Self Care | Payer: BC Managed Care – PPO | Source: Ambulatory Visit | Attending: Internal Medicine | Admitting: Internal Medicine

## 2011-05-18 ENCOUNTER — Inpatient Hospital Stay (HOSPITAL_COMMUNITY): Payer: BC Managed Care – PPO

## 2011-05-18 ENCOUNTER — Encounter: Payer: Self-pay | Admitting: Internal Medicine

## 2011-05-18 ENCOUNTER — Encounter (HOSPITAL_COMMUNITY): Payer: Self-pay | Admitting: General Practice

## 2011-05-18 VITALS — BP 154/89 | HR 97 | Temp 98.3°F | Ht 72.0 in | Wt 185.2 lb

## 2011-05-18 DIAGNOSIS — E119 Type 2 diabetes mellitus without complications: Secondary | ICD-10-CM

## 2011-05-18 DIAGNOSIS — L97509 Non-pressure chronic ulcer of other part of unspecified foot with unspecified severity: Secondary | ICD-10-CM | POA: Diagnosis present

## 2011-05-18 DIAGNOSIS — L03039 Cellulitis of unspecified toe: Secondary | ICD-10-CM | POA: Diagnosis present

## 2011-05-18 DIAGNOSIS — Z87891 Personal history of nicotine dependence: Secondary | ICD-10-CM

## 2011-05-18 DIAGNOSIS — M869 Osteomyelitis, unspecified: Secondary | ICD-10-CM | POA: Diagnosis present

## 2011-05-18 DIAGNOSIS — E11621 Type 2 diabetes mellitus with foot ulcer: Secondary | ICD-10-CM

## 2011-05-18 DIAGNOSIS — S98119A Complete traumatic amputation of unspecified great toe, initial encounter: Secondary | ICD-10-CM

## 2011-05-18 DIAGNOSIS — E1142 Type 2 diabetes mellitus with diabetic polyneuropathy: Secondary | ICD-10-CM

## 2011-05-18 DIAGNOSIS — E785 Hyperlipidemia, unspecified: Secondary | ICD-10-CM

## 2011-05-18 DIAGNOSIS — IMO0002 Reserved for concepts with insufficient information to code with codable children: Principal | ICD-10-CM | POA: Diagnosis present

## 2011-05-18 DIAGNOSIS — E1169 Type 2 diabetes mellitus with other specified complication: Secondary | ICD-10-CM

## 2011-05-18 DIAGNOSIS — L02619 Cutaneous abscess of unspecified foot: Secondary | ICD-10-CM | POA: Diagnosis present

## 2011-05-18 DIAGNOSIS — E1149 Type 2 diabetes mellitus with other diabetic neurological complication: Secondary | ICD-10-CM | POA: Diagnosis present

## 2011-05-18 DIAGNOSIS — E1165 Type 2 diabetes mellitus with hyperglycemia: Principal | ICD-10-CM | POA: Diagnosis present

## 2011-05-18 DIAGNOSIS — I10 Essential (primary) hypertension: Secondary | ICD-10-CM

## 2011-05-18 DIAGNOSIS — M9080 Osteopathy in diseases classified elsewhere, unspecified site: Secondary | ICD-10-CM | POA: Diagnosis present

## 2011-05-18 HISTORY — DX: Pneumonia, unspecified organism: J18.9

## 2011-05-18 HISTORY — DX: Osteomyelitis, unspecified: M86.9

## 2011-05-18 LAB — COMPREHENSIVE METABOLIC PANEL
Alkaline Phosphatase: 72 U/L (ref 39–117)
BUN: 19 mg/dL (ref 6–23)
Chloride: 95 mEq/L — ABNORMAL LOW (ref 96–112)
Creatinine, Ser: 1.2 mg/dL (ref 0.50–1.35)
GFR calc Af Amer: 79 mL/min — ABNORMAL LOW (ref >90)
Glucose, Bld: 182 mg/dL — ABNORMAL HIGH (ref 70–99)
Potassium: 4 mEq/L (ref 3.5–5.1)
Total Bilirubin: 0.3 mg/dL (ref 0.3–1.2)

## 2011-05-18 LAB — CBC
HCT: 34.8 % — ABNORMAL LOW (ref 39.0–52.0)
Hemoglobin: 12 g/dL — ABNORMAL LOW (ref 13.0–17.0)
MCHC: 34.5 g/dL (ref 30.0–36.0)
MCV: 86.6 fL (ref 78.0–100.0)

## 2011-05-18 LAB — GLUCOSE, CAPILLARY
Glucose-Capillary: 345 mg/dL — ABNORMAL HIGH (ref 70–99)
Glucose-Capillary: 340 mg/dL — ABNORMAL HIGH (ref 70–99)
Glucose-Capillary: 259 mg/dL — ABNORMAL HIGH (ref 70–99)

## 2011-05-18 MED ORDER — INSULIN ASPART 100 UNIT/ML ~~LOC~~ SOLN: 0.0000 [IU] | Freq: Three times a day (TID) | SUBCUTANEOUS | Status: DC

## 2011-05-18 MED ORDER — INSULIN ASPART 100 UNIT/ML ~~LOC~~ SOLN
4.0000 [IU] | Freq: Three times a day (TID) | SUBCUTANEOUS | Status: DC
  Administered 2011-05-18: 18:00:00 via SUBCUTANEOUS
  Administered 2011-05-19 – 2011-05-21 (×7): 4 [IU] via SUBCUTANEOUS

## 2011-05-18 MED ORDER — INSULIN GLARGINE 100 UNIT/ML ~~LOC~~ SOLN
15.0000 [IU] | Freq: Every day | SUBCUTANEOUS | Status: DC
  Administered 2011-05-18: 15 [IU] via SUBCUTANEOUS

## 2011-05-18 MED ORDER — LISINOPRIL-HYDROCHLOROTHIAZIDE 20-25 MG PO TABS: 1.0000 | ORAL_TABLET | Freq: Every day | ORAL | Status: DC

## 2011-05-18 MED ORDER — SIMVASTATIN 20 MG PO TABS
20.0000 mg | ORAL_TABLET | Freq: Every day | ORAL | Status: DC
  Administered 2011-05-18 – 2011-05-23 (×6): 20 mg via ORAL
  Filled 2011-05-18 (×7): qty 1

## 2011-05-18 MED ORDER — INSULIN ASPART 100 UNIT/ML ~~LOC~~ SOLN
0.0000 [IU] | Freq: Every day | SUBCUTANEOUS | Status: DC
  Administered 2011-05-20: 5 [IU] via SUBCUTANEOUS
  Administered 2011-05-23: 3 [IU] via SUBCUTANEOUS

## 2011-05-18 MED ORDER — HYDROCHLOROTHIAZIDE 25 MG PO TABS
25.0000 mg | ORAL_TABLET | Freq: Every day | ORAL | Status: DC
  Administered 2011-05-18 – 2011-05-24 (×7): 25 mg via ORAL
  Filled 2011-05-18 (×7): qty 1

## 2011-05-18 MED ORDER — PIPERACILLIN-TAZOBACTAM 3.375 G IVPB
3.3750 g | Freq: Three times a day (TID) | INTRAVENOUS | Status: DC
  Administered 2011-05-18 – 2011-05-24 (×18): 3.375 g via INTRAVENOUS
  Filled 2011-05-18 (×21): qty 50

## 2011-05-18 MED ORDER — INSULIN ASPART 100 UNIT/ML ~~LOC~~ SOLN: 4.0000 [IU] | Freq: Three times a day (TID) | SUBCUTANEOUS | Status: DC

## 2011-05-18 MED ORDER — SODIUM CHLORIDE 0.9 % IV SOLN
INTRAVENOUS | Status: DC
  Administered 2011-05-18 – 2011-05-19 (×2): via INTRAVENOUS

## 2011-05-18 MED ORDER — HYDROCODONE-ACETAMINOPHEN 5-325 MG PO TABS
1.0000 | ORAL_TABLET | ORAL | Status: DC | PRN
  Administered 2011-05-18 – 2011-05-24 (×8): 1 via ORAL
  Filled 2011-05-18 (×8): qty 1

## 2011-05-18 MED ORDER — LISINOPRIL 20 MG PO TABS
20.0000 mg | ORAL_TABLET | Freq: Every day | ORAL | Status: DC
  Administered 2011-05-18 – 2011-05-24 (×7): 20 mg via ORAL
  Filled 2011-05-18 (×7): qty 1

## 2011-05-18 MED ORDER — INSULIN ASPART 100 UNIT/ML ~~LOC~~ SOLN
0.0000 [IU] | Freq: Three times a day (TID) | SUBCUTANEOUS | Status: DC
  Administered 2011-05-18: 8 [IU] via SUBCUTANEOUS
  Administered 2011-05-19: 3 [IU] via SUBCUTANEOUS
  Administered 2011-05-19: 5 [IU] via SUBCUTANEOUS
  Administered 2011-05-19: 3 [IU] via SUBCUTANEOUS
  Administered 2011-05-20: 8 [IU] via SUBCUTANEOUS
  Administered 2011-05-20: 15 [IU] via SUBCUTANEOUS
  Administered 2011-05-20: 5 [IU] via SUBCUTANEOUS
  Administered 2011-05-21: 8 [IU] via SUBCUTANEOUS
  Administered 2011-05-21 (×2): 15 [IU] via SUBCUTANEOUS
  Administered 2011-05-22: 11 [IU] via SUBCUTANEOUS

## 2011-05-18 MED ORDER — ENOXAPARIN SODIUM 40 MG/0.4ML ~~LOC~~ SOLN
40.0000 mg | SUBCUTANEOUS | Status: DC
  Administered 2011-05-18 – 2011-05-23 (×6): 40 mg via SUBCUTANEOUS
  Filled 2011-05-18 (×7): qty 0.4

## 2011-05-18 MED ORDER — INSULIN ASPART 100 UNIT/ML ~~LOC~~ SOLN: 0.0000 [IU] | Freq: Every day | SUBCUTANEOUS | Status: DC

## 2011-05-18 MED ORDER — VANCOMYCIN HCL IN DEXTROSE 1-5 GM/200ML-% IV SOLN
1000.0000 mg | Freq: Three times a day (TID) | INTRAVENOUS | Status: DC
  Administered 2011-05-18 – 2011-05-24 (×18): 1000 mg via INTRAVENOUS
  Filled 2011-05-18 (×21): qty 200

## 2011-05-18 MED ORDER — INSULIN GLARGINE 100 UNIT/ML ~~LOC~~ SOLN: 30.0000 [IU] | Freq: Every day | SUBCUTANEOUS | Status: DC

## 2011-05-18 MED ORDER — ACETAMINOPHEN 650 MG RE SUPP: 650.0000 mg | Freq: Four times a day (QID) | RECTAL | Status: DC | PRN

## 2011-05-18 MED ORDER — ACETAMINOPHEN 325 MG PO TABS
650.0000 mg | ORAL_TABLET | Freq: Four times a day (QID) | ORAL | Status: DC | PRN
  Administered 2011-05-18: 650 mg via ORAL
  Filled 2011-05-18: qty 2

## 2011-05-18 NOTE — Assessment & Plan Note (Signed)
DM continues to be out of control.  He notes compliance with metformin 1000mg  BID and lantus 30u qHS.  CBG today 345.  Hopefully we can achieve better control during hospitalization & discharge him on an appropriate insulin regimen.  He will require close follow up at hospital discharge.

## 2011-05-18 NOTE — Progress Notes (Signed)
ANTIBIOTIC CONSULT NOTE - INITIAL  Pharmacy Consult for Vancomycin and Zosyn Indication: right foot osteomyelitis  No Known Allergies  Patient Measurements: Height: 6' (182.9 cm) Weight: 185 lb 3 oz (84 kg) IBW/kg (Calculated) : 77.6   Vital Signs: Temp: 97.5 F (36.4 C) (05/14 1300) Temp src: Oral (05/14 0916) BP: 147/82 mmHg (05/14 1300) Pulse Rate: 86  (05/14 1300)  Labs:   No labs from today.   Serum creatinine on 04/30/11 = 1.13.   Estimated creatinine clearance ~85-90 ml/min  Microbiology:   Blood cultures ordered today.  Medical History: Past Medical History  Diagnosis Date  . Hyperlipidemia   . Hypertension   . Diabetes mellitus     Diagnosed in 32s; "yohoo freak"  . Osteomyelitis     left great toe, s/p amputation   Assessment:   Has been on Cephalexin and Bactrim since 05/12/11 for diabetic foot ulcer of fifth toe on right foot, but no improvement.  History poorly-controlled diabetes, and prior osteomyelitis of left great toe with amputation in 2010. Admitted for IV antibiotics and further management.  MRI planned.  Goal of Therapy:  Vancomycin trough level 15-20 mcg/ml Appropriate Zosyn dose for renal function and infection  Plan:    Vancomycin 1 gram IV q8hrs.   Zosyn 3.375 grams IV q8hrs (each infused over 4 hours).  Will follow up studies and cultures with you. Will plan to check bmet and CBC in am, if not ordered for today.  Arty Baumgartner, [place] Pager: [phone] 05/18/2011,1:32 PM

## 2011-05-18 NOTE — Assessment & Plan Note (Signed)
Here for follow up from 05/12/11.  Patient has stage 2-3 diabetic foot ulcer that has progressed causing surrounding cellulitis.  Has been packing wound on his own, thought wound clinic was going to contact him.  Has been compliant with cephalexin bactrim as prescribed. He will require admission for IV antibiotics (mrsa, pseudomonas coverage), imaging with MRI to r/o osteomyelitis, and aggressive wound care/debridement.
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
## 2011-05-18 NOTE — Progress Notes (Signed)
  Subjective:   Patient ID: [name] male   DOB: 08-06-1960 51 y.o.   MRN: [id number]  HPI: [name] is a 52 y.o. man with past medical history significant for uncontrolled diabetes, hypertension, hyperlipidemia, and osteomyelitis of his left great toe.  He presents today for follow up of a diabetic foot ulcer of his fifth toe on his right foot. He was seen on 05/12/2011, during which visit he was given cephalexin, Bactrim, and instructions to call the wound clinic. He was under the impression that the wound clinic with clinic contact him, so he's been packing the wound himself. He reports that he is used to wound packing given history of osteomyelitis of the left great toe. He has been compliant with both cephalexin and Bactrim, and he has bottles of both medications with him today. He also has a bottle of aspirin and metformin. He reports that he's been taking 30 units of Lantus every night.  She reports that the wound is getting worse, and the throbbing pain has escalated. There is more redness extending up his foot, accompanied with more bloody and purulent discharge from his toe.  He denies fever/chills.  Review of Systems: Constitutional: Denies fever, chills, diaphoresis, appetite change and fatigue.  HEENT: Denies photophobia, eye pain, redness, hearing loss, ear pain, congestion, sore throat, rhinorrhea, sneezing, mouth sores, trouble swallowing, neck pain, neck stiffness and tinnitus.   Respiratory: Denies SOB, DOE, cough, chest tightness,  and wheezing.   Cardiovascular: Denies chest pain, palpitations and leg swelling.  Gastrointestinal: Denies nausea, vomiting, abdominal pain, diarrhea, constipation, blood in stool and abdominal distention.  Genitourinary: Denies dysuria, urgency, frequency, hematuria, flank pain and difficulty urinating.  Musculoskeletal: Denies myalgias, back pain, joint swelling, and gait problem.  Neurological: Denies dizziness, seizures, syncope, weakness,  light-headedness, numbness and headaches.  Psychiatric/Behavioral: Denies suicidal ideation, mood changes, confusion, nervousness, sleep disturbance and agitation  Objective:  Physical Exam: Filed Vitals:   05/18/11 0916  BP: 154/89  Pulse: 97  Temp: 98.3 F (36.8 C)  TempSrc: Oral  Height: 6' (1.829 m)  Weight: 185 lb 3.2 oz (84.006 kg)   Constitutional: Vital signs reviewed.  Patient is a well-developed and well-nourished man in no acute distress and cooperative with exam.  Mouth: no erythema or exudates, MMM, poor dentition Eyes: PERRL, EOMI, conjunctivae normal, No scleral icterus.  Cardiovascular: RRR, S1 normal, S2 normal, no MRG, pulses symmetric and intact bilaterally Pulmonary/Chest: CTAB, no wheezes, rales, or rhonchi Musculoskeletal: Right fifth toe with ulcerated wound, about 2.5 cm x 1.5 cm, with bloody purulent exudate, wound is packed, black necrotic tissue overlying the wound, erythema and edema extending up the right foot and over all toes, palpable pulse; good ROM of right ankle; left first toe ampuated Neurological: A&O x3, diminished sensation of b/l LE Psychiatric: Normal mood and affect. speech and behavior is normal. Judgment and thought content normal. Cognition and memory are normal.   Assessment & Plan:   Patient or discuss [place]lim[place]. Patient to be admitted to regular MedSurg bed. Discussed with [facility], Dr. Cathren Laine.  Please see problem oriented charting for further details.

## 2011-05-19 ENCOUNTER — Inpatient Hospital Stay (HOSPITAL_COMMUNITY): Payer: BC Managed Care – PPO

## 2011-05-19 LAB — GLUCOSE, CAPILLARY: Glucose-Capillary: 188 mg/dL — ABNORMAL HIGH (ref 70–99)

## 2011-05-19 LAB — BASIC METABOLIC PANEL
BUN: 16 mg/dL (ref 6–23)
Chloride: 100 mEq/L (ref 96–112)
GFR calc Af Amer: 87 mL/min — ABNORMAL LOW (ref >90)
GFR calc non Af Amer: 75 mL/min — ABNORMAL LOW (ref >90)
Potassium: 4.4 mEq/L (ref 3.5–5.1)
Sodium: 135 mEq/L (ref 135–145)

## 2011-05-19 LAB — CBC
HCT: 35.4 % — ABNORMAL LOW (ref 39.0–52.0)
MCHC: 33.3 g/dL (ref 30.0–36.0)
RDW: 11.8 % (ref 11.5–15.5)
WBC: 5.9 10*3/uL (ref 4.0–10.5)

## 2011-05-19 MED ORDER — GADOBENATE DIMEGLUMINE 529 MG/ML IV SOLN: 18.0000 mL | Freq: Once | INTRAVENOUS | Status: DC

## 2011-05-19 MED ORDER — LIVING WELL WITH DIABETES BOOK
Freq: Once | Status: AC
  Administered 2011-05-19: 18:00:00
  Filled 2011-05-19: qty 1

## 2011-05-19 MED ORDER — INSULIN GLARGINE 100 UNIT/ML ~~LOC~~ SOLN
20.0000 [IU] | Freq: Every day | SUBCUTANEOUS | Status: DC
  Administered 2011-05-20: 20 [IU] via SUBCUTANEOUS

## 2011-05-19 NOTE — Progress Notes (Signed)
Utilization review completed. Marykathleen Russi, RN, BSN. 

## 2011-05-19 NOTE — Progress Notes (Signed)
Subjective: No acute events overnight.  Afebrile overnight.  X-ray showed no osteomyelitis, MRI pending.  Objective: Vital signs in last 24 hours: Filed Vitals:   05/18/11 1300 05/18/11 2124 05/19/11 0541  BP: 147/82 150/79 122/70  Pulse: 86 98 86  Temp: 97.5 F (36.4 C) 98.3 F (36.8 C) 97.6 F (36.4 C)  Resp: 18 18 20   Height: 6' (1.829 m)    Weight: 185 lb 3 oz (84 kg)    SpO2: 100% 99% 100%   Weight change:   Intake/Output Summary (Last 24 hours) at 05/19/11 1241 Last data filed at 05/19/11 0300  Gross per 24 hour  Intake 763.33 ml  Output      0 ml  Net 763.33 ml   PEX General: alert, cooperative, and in no apparent distress HEENT: pupils equal round and reactive to light, vision grossly intact, oropharynx clear and non-erythematous  Neck: supple, no lymphadenopathy Lungs: clear to ascultation bilaterally, normal work of respiration, no wheezes, rales, ronchi Heart: regular rate and rhythm, no murmurs, gallops, or rubs Abdomen: soft, non-tender, non-distended, normal bowel sounds Msk: no joint edema, warmth, or erythema Extremities: no cyanosis, clubbing, or edema Neurologic: alert & oriented X3, cranial nerves II-XII intact, strength grossly intact, sensation intact to light touch  Lab Results: Basic Metabolic Panel:  Lab [id number] 05/18/11 2036  NA 135 134*  K 4.4 4.0  CL 100 95*  CO2 27 28  GLUCOSE 213* 182*  BUN 16 19  CREATININE 1.11 1.20  CALCIUM 9.6 9.9  MG -- --  PHOS -- --   Liver Function Tests:  Lab 05/18/11 2036  AST 13  ALT 14  ALKPHOS 72  BILITOT 0.3  PROT 7.3  ALBUMIN 3.2*   CBC:  Lab 05/19/11 0541 05/18/11 2036  WBC 5.9 7.1  NEUTROABS -- --  HGB 11.8* 12.0*  HCT 35.4* 34.8*  MCV 88.3 86.6  PLT 292 307   CBG:  Lab 05/19/11 1110 05/19/11 0609 05/18/11 2148 05/18/11 1628 05/18/11 1241 05/18/11 0923  GLUCAP 191* 240* 188* 259* 340* 345*    Micro Results: Recent Results (from the past 240 hour(s))  CULTURE, BLOOD  (ROUTINE X 2)     Status: Normal (Preliminary result)   Collection Time   05/18/11  2:25 PM      Component Value Range Status Comment   Specimen Description BLOOD ARM LEFT   Final    Special Requests BOTTLES DRAWN AEROBIC AND ANAEROBIC 10CC   Final    Culture  Setup Time [id number]   Final    Culture     Final    Value:        BLOOD CULTURE RECEIVED NO GROWTH TO DATE CULTURE WILL BE HELD FOR 5 DAYS BEFORE ISSUING A FINAL NEGATIVE REPORT   Report Status PENDING   Incomplete   CULTURE, BLOOD (ROUTINE X 2)     Status: Normal (Preliminary result)   Collection Time   05/18/11  2:40 PM      Component Value Range Status Comment   Specimen Description BLOOD ARM RIGHT   Final    Special Requests BOTTLES DRAWN AEROBIC AND ANAEROBIC 10CC   Final    Culture  Setup Time [id number]   Final    Culture     Final    Value:        BLOOD CULTURE RECEIVED NO GROWTH TO DATE CULTURE WILL BE HELD FOR 5 DAYS BEFORE ISSUING A FINAL NEGATIVE REPORT   Report Status PENDING  Incomplete    Studies/Results: Dg Foot Complete Right  05/18/2011  *RADIOLOGY REPORT*  Clinical Data: Lesion along the right fifth toe laterally, the patient is diabetic  RIGHT FOOT COMPLETE - 3+ VIEW  Comparison: None.  Findings: Tarsometatarsal alignment is normal.  Joint spaces appear normal.  No abnormality of the mineralization of the fifth toe is seen.  IMPRESSION: Negative.  Original Report Authenticated By: Joretta Bachelor, M.D.   Medications: I have reviewed the patient's current medications. Scheduled Meds:   . enoxaparin  40 mg Subcutaneous Q24H  . hydrochlorothiazide  25 mg Oral Daily  . insulin aspart  0-15 Units Subcutaneous TID WC  . insulin aspart  0-5 Units Subcutaneous QHS  . insulin aspart  4 Units Subcutaneous TID WC  . insulin glargine  20 Units Subcutaneous QHS  . lisinopril  20 mg Oral Daily  . piperacillin-tazobactam (ZOSYN)  IV  3.375 g Intravenous Q8H  . simvastatin  20 mg Oral q1800  . vancomycin  1,000 mg  Intravenous Q8H  . DISCONTD: insulin aspart  0-15 Units Subcutaneous TID WC  . DISCONTD: insulin aspart  0-5 Units Subcutaneous QHS  . DISCONTD: insulin aspart  4 Units Subcutaneous TID WC  . DISCONTD: insulin glargine  15 Units Subcutaneous QHS  . DISCONTD: insulin glargine  30 Units Subcutaneous QHS  . DISCONTD: lisinopril-hydrochlorothiazide  1 tablet Oral Daily   Continuous Infusions:   . sodium chloride 100 mL/hr at 05/19/11 0300   PRN Meds:.acetaminophen, acetaminophen, HYDROcodone-acetaminophen  Assessment/Plan: The patient is a 51 yo man, history of poorly-controlled DM, presenting with a 10-day history of a R 5th toe ulcer, concerning for cellulitis vs osteomyelitis.  # Right 5th metatarsal ulcer - concerning for cellulitis vs osteomyelitis.  X-ray showed no evidence of osteomyelitis, MRI pending.  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  No fevers, no leukocytosis. -continue IV vanc/zosyn, as lesion did not appear to be responding to PO cephalexin and bactrim (prescribed 5/8) -MRI ordered, pending, to further evaluate for osteomyelitis -if MRI shows osteo, will consult ortho -if MRI shows no osteo, can likely discharge on PO antibiotics -wound care while inpatient and at discharge  # Uncontrolled Diabetes Mellitus with peripheral neuropathy - Hb A1C > 14 (05/12/11).  Patient had been out of all DM medications for several months, but was recently prescribed lantus 30 qhs on 05/12/11. -Lantus 20, increase based on inpatient insulin usage -SSI + 4U meal coverage  # Hypertension - on home lisinopril-HCTZ -continue lisinopril and HCTZ  # Hyperlipidemia - LDL = 146 (05/12/11).  Patient previously on no statin -started simvastatin during this hospitalization -plan to discharge home on pravastatin (as pt has financial difficulty)  # Prophy - lovenox  # Dispo - pending MRI results   LOS: 1 day   [name] 05/19/2011, 12:41 PM

## 2011-05-19 NOTE — Progress Notes (Signed)
Medicine Attending Note [name] has poorly controlled diabetes and in conjunction with peripheral neuropathy and likely small vessel disease he has had prior osteomyelitis of his left great toe and has developed acute cellulitis of left 5th toe with possible osteomyelitis. I have examined patient and agree with findings and broad spectrum antibiotic treatment of soft tissue infection. Agree with MRI of foot to define if he has osteomyelitis to guide duration of therapy. I doubt that ortho will want to biopsy such a small bone.  Lars Mage

## 2011-05-19 NOTE — Progress Notes (Signed)
AACE/ADA: New Consensus Statement on Inpatient Glycemic Control (2009)  Target Ranges:  Prepandial:   less than 140 mg/dL      Peak postprandial:   less than 180 mg/dL (1-2 hours)      Critically ill patients:  140 - 180 mg/dL   Reason for Visit: Elevated HgbA1C of greater 14% Have ordred dietician consult for education, assist with watching TV video ed'l network on DM, the ADA patient education booklet. Will talk with patient to assess problems with glucose control at home. Patient may need meal coverage/correction Novolog at home as well as Lantus.   Note: Thank you, Rosita Kea, RN, CNS, Diabetes Coordinator [phone])

## 2011-05-20 ENCOUNTER — Encounter (HOSPITAL_BASED_OUTPATIENT_CLINIC_OR_DEPARTMENT_OTHER): Payer: BC Managed Care – PPO | Attending: Internal Medicine

## 2011-05-20 LAB — CBC
HCT: 36.2 % — ABNORMAL LOW (ref 39.0–52.0)
Hemoglobin: 12.5 g/dL — ABNORMAL LOW (ref 13.0–17.0)
MCH: 30 pg (ref 26.0–34.0)
MCHC: 34.5 g/dL (ref 30.0–36.0)
MCV: 86.8 fL (ref 78.0–100.0)

## 2011-05-20 LAB — GLUCOSE, CAPILLARY

## 2011-05-20 LAB — BASIC METABOLIC PANEL
BUN: 14 mg/dL (ref 6–23)
CO2: 28 mEq/L (ref 19–32)
Chloride: 94 mEq/L — ABNORMAL LOW (ref 96–112)
Glucose, Bld: 313 mg/dL — ABNORMAL HIGH (ref 70–99)
Potassium: 4.6 mEq/L (ref 3.5–5.1)
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
## 2011-05-20 NOTE — Progress Notes (Signed)
Addended by: Hulan Fray on: 05/20/2011 06:23 PM   Modules accepted: Orders

## 2011-05-20 NOTE — Plan of Care (Signed)
Problem: Food- and Nutrition-Related Knowledge Deficit (NB-1.1) Goal: Nutrition education Formal process to instruct or train a patient/client in a skill or to impart knowledge to help patients/clients voluntarily manage or modify food choices and eating behavior to maintain or improve health.  Outcome: Adequate for Discharge Pt admitted with elevated HgBA1C.  Pt states his diabetes was in better control (admits he has never had 'excellent control') prior to recent financial troubles which lead to inability to obtain medications. Pt feels better control over finances will improve medical condition.  Pt states he understands healthy eating, carb counting, and generally knows 'what to do' and declines RD's offers for education, assistance, resources, handouts, etc.  Encouraged pt to evaluate management of diabetes and request follow-up with RD if food/nutrition-related issues or questions arise.

## 2011-05-20 NOTE — Progress Notes (Addendum)
Subjective: Afebrile overnight.  No significant pain.  Patient eating breakfast comfortably, had MRI this morning.     Objective: Vital signs in last 24 hours: Filed Vitals:   05/19/11 0541 05/19/11 1300 05/19/11 2119 05/20/11 0531  BP: 122/70 135/83 150/84 127/64  Pulse: 86 91 92 88  Temp: 97.6 F (36.4 C) 98.8 F (37.1 C) 98.1 F (36.7 C) 98.5 F (36.9 C)  Resp: 20 18 20 18   Height:      Weight:      SpO2: 100% 99% 100% 100%   General: alert, cooperative, and in no apparent distress  HEENT: pupils equal round and reactive to light, vision grossly intact, oropharynx clear and non-erythematous   Lungs: clear to ascultation bilaterally, normal work of respiration, no wheezes, rales, ronchi  Heart: regular rate and rhythm, no murmurs, gallops, or rubs  Extremities: no cyanosis, clubbing, or edema.  R 5th toe has raw skin over majority of it and is grossly swollen.  No sensation in R 5th toe, sensation gradually increased from R 4th-> 1st toes.  R foot is mildly swollen with increased warmth diffusely.  L 1st Toe has been amputated.    Neurologic: alert & oriented X3, cranial nerves II-XII intact, strength grossly intact, sensation intact to light touch   Lab Results: Basic Metabolic Panel:  Lab [id number] 0510 05/19/11 0541  NA 133* 135  K 4.6 4.4  CL 94* 100  CO2 28 27  GLUCOSE 313* 213*  BUN 14 16  CREATININE 1.07 1.11  CALCIUM 9.9 9.6  MG -- --  PHOS -- --   Liver Function Tests:  Lab 05/18/11 2036  AST 13  ALT 14  ALKPHOS 72  BILITOT 0.3  PROT 7.3  ALBUMIN 3.2*   CBC:  Lab 05/20/11 0510 05/19/11 0541  WBC 6.2 5.9  NEUTROABS -- --  HGB 12.5* 11.8*  HCT 36.2* 35.4*  MCV 86.8 88.3  PLT 318 292   CBG:  Lab 05/19/11 2151 05/19/11 1619 05/19/11 1110 05/19/11 0609 05/18/11 2148 05/18/11 1628  GLUCAP 234* 148* 191* 240* 188* 259*   Urine Drug Screen:  Micro Results: Recent Results (from the past 240 hour(s))  CULTURE, BLOOD (ROUTINE X 2)      Status: Normal (Preliminary result)   Collection Time   05/18/11  2:25 PM      Component Value Range Status Comment   Specimen Description BLOOD ARM LEFT   Final    Special Requests BOTTLES DRAWN AEROBIC AND ANAEROBIC 10CC   Final    Culture  Setup Time [id number]   Final    Culture     Final    Value:        BLOOD CULTURE RECEIVED NO GROWTH TO DATE CULTURE WILL BE HELD FOR 5 DAYS BEFORE ISSUING A FINAL NEGATIVE REPORT   Report Status PENDING   Incomplete   CULTURE, BLOOD (ROUTINE X 2)     Status: Normal (Preliminary result)   Collection Time   05/18/11  2:40 PM      Component Value Range Status Comment   Specimen Description BLOOD ARM RIGHT   Final    Special Requests BOTTLES DRAWN AEROBIC AND ANAEROBIC 10CC   Final    Culture  Setup Time [id number]   Final    Culture     Final    Value:        BLOOD CULTURE RECEIVED NO GROWTH TO DATE CULTURE WILL BE HELD FOR 5 DAYS BEFORE ISSUING A FINAL NEGATIVE REPORT  Report Status PENDING   Incomplete    Studies/Results: Mr Foot Right W Wo Contrast  05/20/2011  *RADIOLOGY REPORT*  Clinical Data: Right foot ulcer.  Osteomyelitis.  MRI OF THE RIGHT FOREFOOT WITHOUT AND WITH CONTRAST  Technique:  Multiplanar, multisequence MR imaging was performed both before and after administration of intravenous contrast.  Contrast:  18 ml Multihance.  Comparison: Radiographs 05/18/2011.  Findings: Nonspecific diffuse increased T2 signal is present within that musculature, which may represent diabetic denervation muscular changes.  Dorsal subcutaneous edema is present in the foot without a soft tissue abscess.  Findings suggest either dependent edema or cellulitis in the appropriate clinical setting.  There is bone marrow edema in the distal aspect of the proximal phalanx of the small toe with an overlying lateral ulceration. This is consistent with osteomyelitis.  Bone marrow edema is also present.  On post-gadolinium imaging, the proximal phalanx and middle phalanx  enhance, compatible with osteomyelitis rather than reactive change.  Partial imaging of the ankle was also performed.  There is a large osteochondral lesion of the posterior medial talar dome. Subchondral edema and cystic changes present.  The lesion measures at least 7 mm.  Chronic degenerative changes are present in the medial malleolus, likely associated with chronic abutment and instability.  The deltoid ligament is poorly defined with inflammatory changes around the medial aspect of the ankle. 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  There is bone marrow edema in the calcaneus and mild enhancement which probably relates to altered weightbearing.  On axial images, there is focal fibromatosis of the plantar fascia involving the medial forefoot.  This is plantar to the fourth and fifth metatarsal bases.  This focal area of fibromatosis measures 8 mm plantar and dorsal, 17 mm transverse and 19 cm mm long axis. The peroneal tendons appear within normal limits.  IMPRESSION: 1.  Osteomyelitis of the middle and proximal phalanx of the right small toe.   Metatarsal appears within normal limits. 2.  Diabetic denervation atrophy and edema in the foot musculature. 3.  Diffuse subcutaneous edema likely representing cellulitis in the appropriate clinical setting. 4.  Osteochondral lesion of the medial talar dome with associated cystic changes in the medial malleolus, compatible with chronic abutment. Poor definition of the deltoid ligament suggest chronic deltoid tear.  The study was not optimized for evaluation of the ankle ligaments.  If there is an ongoing concern, MRI of the ankle may be performed.  5.  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  Original Report Authenticated By: Dereck Ligas, M.D.   Dg Foot Complete Right  05/18/2011  *RADIOLOGY REPORT*  Clinical Data: Lesion along the right fifth toe laterally, the patient is diabetic  RIGHT FOOT COMPLETE - 3+ VIEW  Comparison: None.  Findings:  Tarsometatarsal alignment is normal.  Joint spaces appear normal.  No abnormality of the mineralization of the fifth toe is seen.  IMPRESSION: Negative.  Original Report Authenticated By: Joretta Bachelor, M.D.   Medications: I have reviewed the patient's current medications. Scheduled Meds:   . enoxaparin  40 mg Subcutaneous Q24H  . gadobenate dimeglumine  18 mL Intravenous Once  . hydrochlorothiazide  25 mg Oral Daily  . insulin aspart  0-15 Units Subcutaneous TID WC  . insulin aspart  0-5 Units Subcutaneous QHS  . insulin aspart  4 Units Subcutaneous TID WC  . insulin glargine  20 Units Subcutaneous QHS  . lisinopril  20 mg Oral Daily  . living well with diabetes  book   Does not apply Once  . piperacillin-tazobactam (ZOSYN)  IV  3.375 g Intravenous Q8H  . simvastatin  20 mg Oral q1800  . vancomycin  1,000 mg Intravenous Q8H  . DISCONTD: insulin glargine  15 Units Subcutaneous QHS   Continuous Infusions:   . DISCONTD: sodium chloride 100 mL/hr at 05/19/11 1305   PRN Meds:.acetaminophen, acetaminophen, HYDROcodone-acetaminophen  Assessment/Plan:  The patient is a 51 yo man, history of poorly-controlled DM, presenting with a 10-day history of a R 5th toe ulcer, concerning for cellulitis vs osteomyelitis.   # Right 5th metatarsal ulcer - MRI performed this morning and found osteomyelitis of middle and proximal phalanx of R 5th toe.  Will consult orthopaedics for possible surgical management. 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 No fevers, no leukocytosis.   -continue IV vanc/zosyn, as lesion did not appear to be responding to PO cephalexin and bactrim (prescribed 5/8)  -Ortho consult **Request for ortho consultant: If a debridement or amputation is performed please send tissue for gram stain and culture** -wound care while inpatient and at discharge   # Uncontrolled Diabetes Mellitus with peripheral neuropathy: CBGs a bit high 148-234 last  day.  On Lantus 20 units nightly and aspart 4 units TID with meals plus SSI moderate.  Will increase Lantus to 24 nightly tonight.     - Hb A1C > 14 (05/12/11). Patient had been out of all DM medications for several months, but was recently prescribed lantus 30 qhs on 05/12/11.  -Lantus 20, increase to 24 units tonight. -SSI + 4U meal coverage   # Hypertension - on home lisinopril-HCTZ  -continue lisinopril and HCTZ since BPs look ok on home regimen in hospital  # Hyperlipidemia - LDL = 146 (05/12/11). Patient previously on no statin  -started simvastatin during this hospitalization  -plan to discharge home on pravastatin (as pt has financial difficulty)   # Prophy - lovenox      LOS: 2 days   Orvilla Fus, BRAD 05/20/2011, 8:24 AM

## 2011-05-21 ENCOUNTER — Encounter: Payer: Self-pay | Admitting: Licensed Clinical Social Worker

## 2011-05-21 DIAGNOSIS — M869 Osteomyelitis, unspecified: Secondary | ICD-10-CM

## 2011-05-21 DIAGNOSIS — E1165 Type 2 diabetes mellitus with hyperglycemia: Principal | ICD-10-CM

## 2011-05-21 LAB — GLUCOSE, CAPILLARY: Glucose-Capillary: 288 mg/dL — ABNORMAL HIGH (ref 70–99)

## 2011-05-21 LAB — VANCOMYCIN, TROUGH: Vancomycin Tr: 15.4 ug/mL (ref 10.0–20.0)

## 2011-05-21 MED ORDER — INSULIN GLARGINE 100 UNIT/ML ~~LOC~~ SOLN
28.0000 [IU] | Freq: Every day | SUBCUTANEOUS | Status: DC
  Administered 2011-05-21: 28 [IU] via SUBCUTANEOUS

## 2011-05-21 MED ORDER — INSULIN ASPART 100 UNIT/ML ~~LOC~~ SOLN
6.0000 [IU] | Freq: Three times a day (TID) | SUBCUTANEOUS | Status: DC
  Administered 2011-05-21 – 2011-05-22 (×3): 6 [IU] via SUBCUTANEOUS

## 2011-05-21 NOTE — Progress Notes (Signed)
Subjective:     Patient reports pain as 1 on 0-10 scale.   Denies CP or SOB.  Voiding without difficulty. Positive flatus. Objective: Vital signs in last 24 hours: Temp:  [98.4 F (36.9 C)-99.5 F (37.5 C)] 98.4 F (36.9 C) (05/17 0540) Pulse Rate:  [90-97] 90  (05/17 0540) Resp:  [18] 18  (05/17 0540) BP: (127-149)/(80-89) 149/89 mmHg (05/17 0540) SpO2:  [100 %] 100 % (05/17 0540)  Intake/Output from previous day: 05/16 0701 - 05/17 0700 In: 500 [IV Piggyback:500] Out: -  Intake/Output this shift:     Basename 05/20/11 0510 05/19/11 0541 05/18/11 2036  HGB 12.5* 11.8* 12.0*    Basename 05/20/11 0510 05/19/11 0541  WBC 6.2 5.9  RBC 4.17* 4.01*  HCT 36.2* 35.4*  PLT 318 292    Basename 05/20/11 0510 05/19/11 0541  NA 133* 135  K 4.6 4.4  CL 94* 100  CO2 28 27  BUN 14 16  CREATININE 1.07 1.11  GLUCOSE 313* 213*  CALCIUM 9.9 9.6   No results found for this basename: LABPT:2,INR:2 in the last 72 hours  PE unchanged from yesterday Cont with decreased sensation over digits  Right foot With necrotic 5 th digit, no odor, no active drainage Assessment/Plan:     Will consult Dr Doran Durand for possible amputation of toe No urgent need for surgery at this time will cont to monitor  [name] R. 05/21/2011, 8:55 AM

## 2011-05-21 NOTE — Progress Notes (Signed)
Subjective:  No complaints.  Patient actually walked on his foot in just socks yesterday and has had some sharp pains at times since then.      Objective: Vital signs in last 24 hours: Filed Vitals:   05/19/11 2119 05/20/11 0531 05/20/11 2121 05/21/11 0540  BP: 150/84 127/64 127/80 149/89  Pulse: 92 88 97 90  Temp: 98.1 F (36.7 C) 98.5 F (36.9 C) 99.5 F (37.5 C) 98.4 F (36.9 C)  TempSrc:   Oral Oral  Resp: 20 18 18 18   Height:      Weight:      SpO2: 100% 100% 100% 100%    General: alert, cooperative, and in no apparent distress   HEENT: pupils equal round and reactive to light, vision grossly intact, oropharynx clear and non-erythematous   Lungs: clear to ascultation bilaterally, normal work of respiration, no wheezes, rales, ronchi   Heart: regular rate and rhythm, no murmurs, gallops, or rubs   Extremities: no cyanosis, clubbing, or edema.  R 5th toe is diffusely dusky with no sensation.  Line of raw skin over lateral surface that is bleeding scantly.  Good pulses in the foot and sensation grossly intact in rest of foot.    Neurologic: alert & oriented X3, cranial nerves II-XII intact, strength grossly intact, sensation intact to light touch    Lab Results: Basic Metabolic Panel:  Lab 123456 0510 05/19/11 0541  NA 133* 135  K 4.6 4.4  CL 94* 100  CO2 28 27  GLUCOSE 313* 213*  BUN 14 16  CREATININE 1.07 1.11  CALCIUM 9.9 9.6  MG -- --  PHOS -- --   Liver Function Tests:  Lab 05/18/11 2036  AST 13  ALT 14  ALKPHOS 72  BILITOT 0.3  PROT 7.3  ALBUMIN 3.2*   CBC:  Lab 05/20/11 0510 05/19/11 0541  WBC 6.2 5.9  NEUTROABS -- --  HGB 12.5* 11.8*  HCT 36.2* 35.4*  MCV 86.8 88.3  PLT 318 292   CBG:  Lab 05/19/11 2151 05/19/11 1619 05/19/11 1110 05/19/11 0609 05/18/11 2148 05/18/11 1628  GLUCAP 234* 148* 191* 240* 188* 259*   Urine Drug Screen: Drugs of Abuse     Component Value Date/Time   LABOPIA POSITIVE* 03/20/2009 0455    COCAINSCRNUR NONE DETECTED 03/20/2009 0455   LABBENZ NONE DETECTED 03/20/2009 0455   AMPHETMU NONE DETECTED 03/20/2009 0455   THCU NONE DETECTED 03/20/2009 0455   LABBARB  Value: NONE DETECTED        DRUG SCREEN FOR MEDICAL PURPOSES ONLY.  IF CONFIRMATION IS NEEDED FOR ANY PURPOSE, NOTIFY LAB WITHIN 5 DAYS.        LOWEST DETECTABLE LIMITS FOR URINE DRUG SCREEN Drug Class       Cutoff (ng/mL) Amphetamine      1000 Barbiturate      200 Benzodiazepine   [id number] Tricyclics       [id number] Opiates          300 Cocaine          300 THC              50 03/20/2009 0455     Micro Results: Recent Results (from the past 240 hour(s))  CULTURE, BLOOD (ROUTINE X 2)     Status: Normal (Preliminary result)   Collection Time   05/18/11  2:25 PM      Component Value Range Status Comment   Specimen Description BLOOD ARM LEFT   Final    Special  Requests BOTTLES DRAWN AEROBIC AND ANAEROBIC 10CC   Final    Culture  Setup Time [id number]   Final    Culture     Final    Value:        BLOOD CULTURE RECEIVED NO GROWTH TO DATE CULTURE WILL BE HELD FOR 5 DAYS BEFORE ISSUING A FINAL NEGATIVE REPORT   Report Status PENDING   Incomplete   CULTURE, BLOOD (ROUTINE X 2)     Status: Normal (Preliminary result)   Collection Time   05/18/11  2:40 PM      Component Value Range Status Comment   Specimen Description BLOOD ARM RIGHT   Final    Special Requests BOTTLES DRAWN AEROBIC AND ANAEROBIC 10CC   Final    Culture  Setup Time [id number]   Final    Culture     Final    Value:        BLOOD CULTURE RECEIVED NO GROWTH TO DATE CULTURE WILL BE HELD FOR 5 DAYS BEFORE ISSUING A FINAL NEGATIVE REPORT   Report Status PENDING   Incomplete    Studies/Results: Mr Foot Right W Wo Contrast  05/20/2011  *RADIOLOGY REPORT*  Clinical Data: Right foot ulcer.  Osteomyelitis.  MRI OF THE RIGHT FOREFOOT WITHOUT AND WITH CONTRAST  Technique:  Multiplanar, multisequence MR imaging was performed both before and after administration of intravenous contrast.   Contrast:  18 ml Multihance.  Comparison: Radiographs 05/18/2011.  Findings: Nonspecific diffuse increased T2 signal is present within that musculature, which may represent diabetic denervation muscular changes.  Dorsal subcutaneous edema is present in the foot without a soft tissue abscess.  Findings suggest either dependent edema or cellulitis in the appropriate clinical setting.  There is bone marrow edema in the distal aspect of the proximal phalanx of the small toe with an overlying lateral ulceration. This is consistent with osteomyelitis.  Bone marrow edema is also present.  On post-gadolinium imaging, the proximal phalanx and middle phalanx enhance, compatible with osteomyelitis rather than reactive change.  Partial imaging of the ankle was also performed.  There is a large osteochondral lesion of the posterior medial talar dome. Subchondral edema and cystic changes present.  The lesion measures at least 7 mm.  Chronic degenerative changes are present in the medial malleolus, likely associated with chronic abutment and instability.  The deltoid ligament is poorly defined with inflammatory changes around the medial aspect of the ankle. 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  There is bone marrow edema in the calcaneus and mild enhancement which probably relates to altered weightbearing.  On axial images, there is focal fibromatosis of the plantar fascia involving the medial forefoot.  This is plantar to the fourth and fifth metatarsal bases.  This focal area of fibromatosis measures 8 mm plantar and dorsal, 17 mm transverse and 19 cm mm long axis. The peroneal tendons appear within normal limits.  IMPRESSION: 1.  Osteomyelitis of the middle and proximal phalanx of the right small toe.   Metatarsal appears within normal limits. 2.  Diabetic denervation atrophy and edema in the foot musculature. 3.  Diffuse subcutaneous edema likely representing cellulitis in the appropriate clinical setting. 4.  Osteochondral lesion of  the medial talar dome with associated cystic changes in the medial malleolus, compatible with chronic abutment. Poor definition of the deltoid ligament suggest chronic deltoid tear.  The study was not optimized for evaluation of the ankle ligaments.  If there is an ongoing concern, MRI of the ankle may be performed.  5.  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  Original Report Authenticated By: Dereck Ligas, M.D.   Medications: I have reviewed the patient's current medications. Scheduled Meds:   . enoxaparin  40 mg Subcutaneous Q24H  . gadobenate dimeglumine  18 mL Intravenous Once  . hydrochlorothiazide  25 mg Oral Daily  . insulin aspart  0-15 Units Subcutaneous TID WC  . insulin aspart  0-5 Units Subcutaneous QHS  . insulin aspart  4 Units Subcutaneous TID WC  . insulin glargine  20 Units Subcutaneous QHS  . lisinopril  20 mg Oral Daily  . piperacillin-tazobactam (ZOSYN)  IV  3.375 g Intravenous Q8H  . simvastatin  20 mg Oral q1800  . vancomycin  1,000 mg Intravenous Q8H   Continuous Infusions:  PRN Meds:.acetaminophen, acetaminophen, HYDROcodone-acetaminophen   Assessment/Plan:  The patient is a 51 yo man, history of poorly-controlled DM, presenting with a 10-day history of a R 5th toe ulcer, concerning for cellulitis vs osteomyelitis.   # Right 5th metatarsal ulcer - MRI performed this morning and found osteomyelitis of middle and proximal phalanx of R 5th toe. Ortho is following for possible surgical management, for now we are just watching for possible improvement.  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 No fevers, no leukocytosis.  No change from yesterday.  Will clarify that patient should not weight bear on R foot for now.   -continue IV vanc/zosyn -wound care while inpatient and at discharge  -Non-weightbearing R foot. Will follow ortho recommendations for possible special boot for alternative way to weight  bear on R foot without putting pressure on R 5th toe?    # Uncontrolled Diabetes Mellitus with peripheral neuropathy: CBGs a bit high 288-231 last day.  Requiring lots of sliding scale.  Lantus was not increased yesterday out of concern that ortho may have wanted to take patient to surgery.  Since sugars even higher today will increase lantus to 28 units nightly.  Will make meal coverage 6 units. Hb A1C > 14 (05/12/11). Patient had been out of all DM medications for several months, but was recently prescribed lantus 30 qhs on 05/12/11.  -Lantus 20, increase to 28 units tonight.  -SSI + 6U meal coverage   # Hypertension - on home lisinopril-HCTZ  -continue lisinopril and HCTZ since BPs look ok on home regimen in hospital   # Hyperlipidemia - LDL = 146 (05/12/11). Patient previously on no statin  -started simvastatin during this hospitalization  -plan to discharge home on pravastatin (as pt has financial difficulty)   # Prophy - lovenox     LOS: 3 days   Orvilla Fus, BRAD 05/21/2011, 10:57 AM

## 2011-05-21 NOTE — Progress Notes (Signed)
Mr. Rosales was referred for CSW for intentional under dosing of medication regimen due to financial hardship.  While reviewing pt's chart, CSW noticed pt is currently hospitalized.  CSW will follow up with pt upon discharge from hospital.

## 2011-05-21 NOTE — Progress Notes (Signed)
ANTIBIOTIC CONSULT NOTE - FOLLOW UP  Pharmacy Consult for Vancomycin and Zosyn Indication: Osteomyelitis of foot/toe  No Known Allergies  Patient Measurements: Height: 6' (182.9 cm) Weight: 185 lb 3 oz (84 kg) IBW/kg (Calculated) : 77.6  Adjusted Body Weight:   Vital Signs: Temp: 98.4 F (36.9 C) (05/17 0540) Temp src: Oral (05/17 0540) BP: 149/89 mmHg (05/17 0540) Pulse Rate: 90  (05/17 0540) Intake/Output from previous day: 05/16 0701 - 05/17 0700 In: 500 [IV Piggyback:500] Out: -  Intake/Output from this shift:    Labs:  Basename 05/20/11 0510 05/19/11 0541 05/18/11 2036  WBC 6.2 5.9 7.1  HGB 12.5* 11.8* 12.0*  PLT 318 292 307  LABCREA -- -- --  CREATININE 1.07 1.11 1.20   Estimated Creatinine Clearance: 89.6 ml/min (by C-G formula based on Cr of 1.07).  Basename 05/21/11 0825  VANCOTROUGH 15.4  VANCOPEAK --  VANCORANDOM --  GENTTROUGH --  GENTPEAK --  GENTRANDOM --  TOBRATROUGH --  TOBRAPEAK --  TOBRARND --  AMIKACINPEAK --  AMIKACINTROU --  AMIKACIN --     Microbiology: Recent Results (from the past 720 hour(s))  CULTURE, BLOOD (ROUTINE X 2)     Status: Normal (Preliminary result)   Collection Time   05/18/11  2:25 PM      Component Value Range Status Comment   Specimen Description BLOOD ARM LEFT   Final    Special Requests BOTTLES DRAWN AEROBIC AND ANAEROBIC 10CC   Final    Culture  Setup Time [id number]   Final    Culture     Final    Value:        BLOOD CULTURE RECEIVED NO GROWTH TO DATE CULTURE WILL BE HELD FOR 5 DAYS BEFORE ISSUING A FINAL NEGATIVE REPORT   Report Status PENDING   Incomplete   CULTURE, BLOOD (ROUTINE X 2)     Status: Normal (Preliminary result)   Collection Time   05/18/11  2:40 PM      Component Value Range Status Comment   Specimen Description BLOOD ARM RIGHT   Final    Special Requests BOTTLES DRAWN AEROBIC AND ANAEROBIC 10CC   Final    Culture  Setup Time [id number]   Final    Culture     Final    Value:         BLOOD CULTURE RECEIVED NO GROWTH TO DATE CULTURE WILL BE HELD FOR 5 DAYS BEFORE ISSUING A FINAL NEGATIVE REPORT   Report Status PENDING   Incomplete     Anti-infectives     Start     Dose/Rate Route Frequency Ordered Stop   05/18/11 1400  piperacillin-tazobactam (ZOSYN) IVPB 3.375 g       3.375 g 12.5 mL/hr over 240 Minutes Intravenous 3 times per day 05/18/11 1332     05/18/11 1400   vancomycin (VANCOCIN) IVPB 1000 mg/200 mL premix        1,000 mg 200 mL/hr over 60 Minutes Intravenous Every 8 hours 05/18/11 1332            Assessment: [name] on Vancomycin and Zosyn Day 3 for MRI confirmed osteomyelitis of toe. Patient has remained afebrile, WBC wnl, blood cultures have reported NGTD and renal function has remained stable. Ortho has been consulted with plan for possible amputation. Vancomycin trough this AM was therapeutic at 15.4 on Vancomycin 1g q8h - will continue regimen.   Goal of Therapy:  Vancomycin trough level 15-20 mcg/ml  Plan:  1. Continue Vancomycin 1g IV  q8h 2. Continue Zosyn 3.375g IV q8h 3. Follow-up ortho plan, cultures, renal function and antibiotic plan  [name] [id number] 05/21/2011,9:30 AM

## 2011-05-22 LAB — GLUCOSE, CAPILLARY
Glucose-Capillary: 309 mg/dL — ABNORMAL HIGH (ref 70–99)
Glucose-Capillary: 174 mg/dL — ABNORMAL HIGH (ref 70–99)

## 2011-05-22 MED ORDER — INSULIN ASPART 100 UNIT/ML ~~LOC~~ SOLN
10.0000 [IU] | Freq: Three times a day (TID) | SUBCUTANEOUS | Status: DC
  Administered 2011-05-22 – 2011-05-24 (×7): 10 [IU] via SUBCUTANEOUS

## 2011-05-22 MED ORDER — INSULIN ASPART 100 UNIT/ML ~~LOC~~ SOLN
0.0000 [IU] | Freq: Three times a day (TID) | SUBCUTANEOUS | Status: DC
  Administered 2011-05-22 – 2011-05-23 (×3): 7 [IU] via SUBCUTANEOUS
  Administered 2011-05-23: 5 [IU] via SUBCUTANEOUS
  Administered 2011-05-23: 7 [IU] via SUBCUTANEOUS
  Administered 2011-05-24 (×2): 3 [IU] via SUBCUTANEOUS

## 2011-05-22 MED ORDER — INSULIN GLARGINE 100 UNIT/ML ~~LOC~~ SOLN: 30.0000 [IU] | Freq: Every day | SUBCUTANEOUS | Status: DC

## 2011-05-22 MED ORDER — INSULIN GLARGINE 100 UNIT/ML ~~LOC~~ SOLN
32.0000 [IU] | Freq: Every day | SUBCUTANEOUS | Status: DC
  Administered 2011-05-22 – 2011-05-23 (×2): 32 [IU] via SUBCUTANEOUS

## 2011-05-22 NOTE — Progress Notes (Signed)
Educated patient on the importance of monitoring his diet with diabetes. I also had the patient watch the carb counting video on the television. He is uncertain as to why his blood glucose remains high throughout the day. I will further educate the patient on how to manage his diabetes.

## 2011-05-22 NOTE — Progress Notes (Signed)
Orthopedic Tech Progress Note Patient Details:  [name] 03-01-1960 [id number]  Other Ortho Devices Type of Ortho Device: Postop boot Ortho Device Interventions: Application   [name] 05/22/2011, 11:28 AM

## 2011-05-22 NOTE — Progress Notes (Signed)
[name]  MRN: [id number] DOB/Age: 51-Jan-1962 51 y.o. Physician: Ander Slade, M.D.      Subjective: No new C/O Vital Signs Temp:  [97.7 F (36.5 C)-98.5 F (36.9 C)] 98.4 F (36.9 C) (05/18 0621) Pulse Rate:  [85-93] 85  (05/18 0621) Resp:  [16-18] 18  (05/18 0621) BP: (127-157)/(81-92) 128/83 mmHg (05/18 0621) SpO2:  [100 %] 100 % (05/18 0621)  Lab Results  Basename 05/20/11 0510  WBC 6.2  HGB 12.5*  HCT 36.2*  PLT 318   BMET  Basename 05/20/11 0510  NA 133*  K 4.6  CL 94*  CO2 28  GLUCOSE 313*  BUN 14  CREATININE 1.07  CALCIUM 9.9   No results found for this basename: inr     Exam  Necrotic right 5th toe with dry dressing  Plan Dr. Sharol Given has kindly agreed to evaluate patient tomorrow for discussion of definitive surgical intervention. [name] M 05/22/2011, 9:04 AM

## 2011-05-22 NOTE — Progress Notes (Signed)
Patient ID: [name], male   DOB: [birth year], 51 y.o.   MRN: [id number]   Due to continued concern over digit viability I have consulted Dr.Duda to see patient for probable amputation. Dr. Sharol Given has agreed to see the patient on Sunday 5/18 for discussion. Continue antibiotics.  Dr. Tonita Cong [phone]

## 2011-05-22 NOTE — Progress Notes (Signed)
Subjective: Patient has no complaints.  No overnight events.    Objective: Vital signs in last 24 hours: Filed Vitals:   05/21/11 0540 05/21/11 1420 05/21/11 2204 05/22/11 0621  BP: 149/89 157/92 127/81 128/83  Pulse: 90 91 93 85  Temp: 98.4 F (36.9 C) 97.7 F (36.5 C) 98.5 F (36.9 C) 98.4 F (36.9 C)  TempSrc: Oral     Resp: 18 16 18 18   Height:      Weight:      SpO2: 100% 100% 100% 100%     General: alert, cooperative, and in no apparent distress  HEENT: pupils equal round and reactive to light, vision grossly intact, oropharynx clear and non-erythematous  Lungs: clear to ascultation bilaterally, normal work of respiration, no wheezes, rales, ronchi  Heart: regular rate and rhythm, no murmurs, gallops, or rubs   Extremities: no cyanosis, clubbing, or edema.  R 5th toe is diffusely dusky.    Patient actually reports some sensation to light touch in tip of R 5th toe on exam today that is new.   Line of raw skin over lateral surface that is bleeding scantly.  Medially the skin is raw on side of R 5th toe touching 4th toe.   Good pulses in the foot and sensation grossly intact in rest of foot.   Neurologic: alert & oriented X3, cranial nerves II-XII intact, strength grossly intact, sensation intact to light touch   Lab Results: Basic Metabolic Panel:  Lab [id number] 0510 05/19/11 0541  NA 133* 135  K 4.6 4.4  CL 94* 100  CO2 28 27  GLUCOSE 313* 213*  BUN 14 16  CREATININE 1.07 1.11  CALCIUM 9.9 9.6  MG -- --  PHOS -- --   Liver Function Tests:  Lab 05/18/11 2036  AST 13  ALT 14  ALKPHOS 72  BILITOT 0.3  PROT 7.3  ALBUMIN 3.2*   CBC:  Lab 05/20/11 0510 05/19/11 0541  WBC 6.2 5.9  NEUTROABS -- --  HGB 12.5* 11.8*  HCT 36.2* 35.4*  MCV 86.8 88.3  PLT 318 292   CBG:  Lab 05/22/11 0557 05/21/11 2148 05/21/11 1649 05/21/11 1120 05/21/11 0635 05/20/11 2305  GLUCAP 326* 185* 364* 264* 359* 356*   Urine Drug Screen: Drugs of Abuse       Component Value Date/Time   LABOPIA POSITIVE* 03/20/2009 0455   COCAINSCRNUR NONE DETECTED 03/20/2009 0455   LABBENZ NONE DETECTED 03/20/2009 0455   AMPHETMU NONE DETECTED 03/20/2009 0455   THCU NONE DETECTED 03/20/2009 0455   LABBARB  Value: NONE DETECTED        DRUG SCREEN FOR MEDICAL PURPOSES ONLY.  IF CONFIRMATION IS NEEDED FOR ANY PURPOSE, NOTIFY LAB WITHIN 5 DAYS.        LOWEST DETECTABLE LIMITS FOR URINE DRUG SCREEN Drug Class       Cutoff (ng/mL) Amphetamine      1000 Barbiturate      200 Benzodiazepine   [id number] Tricyclics       [id number] Opiates          300 Cocaine          300 THC              50 03/20/2009 0455      Micro Results: Recent Results (from the past 240 hour(s))  CULTURE, BLOOD (ROUTINE X 2)     Status: Normal (Preliminary result)   Collection Time   05/18/11  2:25 PM      Component Value  Range Status Comment   Specimen Description BLOOD ARM LEFT   Final    Special Requests BOTTLES DRAWN AEROBIC AND ANAEROBIC 10CC   Final    Culture  Setup Time [id number]   Final    Culture     Final    Value:        BLOOD CULTURE RECEIVED NO GROWTH TO DATE CULTURE WILL BE HELD FOR 5 DAYS BEFORE ISSUING A FINAL NEGATIVE REPORT   Report Status PENDING   Incomplete   CULTURE, BLOOD (ROUTINE X 2)     Status: Normal (Preliminary result)   Collection Time   05/18/11  2:40 PM      Component Value Range Status Comment   Specimen Description BLOOD ARM RIGHT   Final    Special Requests BOTTLES DRAWN AEROBIC AND ANAEROBIC 10CC   Final    Culture  Setup Time [id number]   Final    Culture     Final    Value:        BLOOD CULTURE RECEIVED NO GROWTH TO DATE CULTURE WILL BE HELD FOR 5 DAYS BEFORE ISSUING A FINAL NEGATIVE REPORT   Report Status PENDING   Incomplete    Medications: I have reviewed the patient's current medications. Scheduled Meds:   . enoxaparin  40 mg Subcutaneous Q24H  . gadobenate dimeglumine  18 mL Intravenous Once  . hydrochlorothiazide  25 mg Oral Daily  . insulin aspart  0-15  Units Subcutaneous TID WC  . insulin aspart  0-5 Units Subcutaneous QHS  . insulin aspart  6 Units Subcutaneous TID WC  . insulin glargine  28 Units Subcutaneous QHS  . lisinopril  20 mg Oral Daily  . piperacillin-tazobactam (ZOSYN)  IV  3.375 g Intravenous Q8H  . simvastatin  20 mg Oral q1800  . vancomycin  1,000 mg Intravenous Q8H  . DISCONTD: insulin aspart  4 Units Subcutaneous TID WC  . DISCONTD: insulin glargine  20 Units Subcutaneous QHS   Continuous Infusions:  PRN Meds:.acetaminophen, acetaminophen, HYDROcodone-acetaminophen    Assessment/Plan:  LOS: 4 days   The patient is a 51 yo man, history of poorly-controlled DM, presenting with a 10-day history of a R 5th toe ulcer, found to have osteomyelitis.  # Right 5th metatarsal ulcer - MRI performed found osteomyelitis of middle and proximal phalanx of R 5th toe. Ortho is following for possible surgical management, for now we are just watching for possible improvement. 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 No fevers, no leukocytosis. No change from yesterday.   -continue IV vanc/zosyn  -wound care while inpatient and at discharge  -Non-weightbearing R foot.  If patient has to weight bear to get to bathroom he should wear postop boot.  Wear postop boot whenever out of bed.  # Uncontrolled Diabetes Mellitus with peripheral neuropathy: CBGs still too high. Requiring lots of sliding scale.  Hb A1C > 14 (05/12/11). Patient had been out of all DM medications for several months, but was recently prescribed lantus 30 qhs on 05/12/11.   -Increase Lantus to 32 units QHS, meal time novolog to 10 units TIDAC.  Likely will need to continue increasing his regimen. -SSI changed to sensitive to avoid overdosing insulin at meals.  # Hypertension - on home lisinopril-HCTZ  -continue lisinopril and HCTZ since BPs look ok on home regimen in hospital   # Hyperlipidemia - LDL = 146 (05/12/11). Patient  previously on no statin  -started simvastatin during  this hospitalization  -plan to discharge home on pravastatin (as pt has financial difficulty)   # Prophy - lovenox       Shelitha Magley, BRAD 05/22/2011, 8:24 AM

## 2011-05-23 LAB — GLUCOSE, CAPILLARY
Glucose-Capillary: 323 mg/dL — ABNORMAL HIGH (ref 70–99)
Glucose-Capillary: 281 mg/dL — ABNORMAL HIGH (ref 70–99)

## 2011-05-23 NOTE — Progress Notes (Signed)
Subjective:      Patient reports pain as mild.  Reports unchanged leg pain Positive void Negative bowel movement Positive flatus Negative chest pain or shortness of breath  Objective: Vital signs in last 24 hours: Temp:  [97.9 F (36.6 C)-98.8 F (37.1 C)] 97.9 F (36.6 C) (05/19 0535) Pulse Rate:  [91-94] 92  (05/19 0535) Resp:  [16-18] 18  (05/19 0535) BP: (132-148)/(84-94) 147/94 mmHg (05/19 0535) SpO2:  [98 %-100 %] 99 % (05/19 0535)  Intake/Output from previous day: 05/18 0701 - 05/19 0700 In: 2730 [P.O.:1080; I.V.:150; IV Piggyback:1500] Out: -   No results found for this basename: WBC:2,RBC:2,HCT:2,PLT:2 in the last 72 hours No results found for this basename: NA:2,K:2,CL:2,CO2:2,BUN:2,CREATININE:2,GLUCOSE:2,CALCIUM:2 in the last 72 hours No results found for this basename: LABPT:2,INR:2 in the last 72 hours  No change to exam  Assessment/Plan: Patient stable  Waiting definitive rec from Dr. Sharol Given Continue care Dr. Rolena Infante evaluated the patient this morning   Jacquelynn Cree 05/23/2011, 9:38 AM

## 2011-05-23 NOTE — Progress Notes (Signed)
Subjective:  No overnight events.  Minimal pain.     Objective: Vital signs in last 24 hours: Filed Vitals:   05/22/11 0621 05/22/11 1400 05/22/11 2055 05/23/11 0535  BP: 128/83 148/84 132/86 147/94  Pulse: 85 94 91 92  Temp: 98.4 F (36.9 C) 98.8 F (37.1 C) 98.5 F (36.9 C) 97.9 F (36.6 C)  TempSrc:  Oral Oral Oral  Resp: 18 18 16 18   Height:      Weight:      SpO2: 100% 98% 100% 99%    General: alert, cooperative, and in no apparent distress  HEENT: pupils equal round and reactive to light, vision grossly intact, oropharynx clear and non-erythematous  Lungs: clear to ascultation bilaterally, normal work of respiration, no wheezes, rales, ronchi  Heart: regular rate and rhythm, no murmurs, gallops, or rubs  Extremities: no cyanosis, clubbing, or edema.  R 5th toe is diffusely dusky.  Patient has no sensation in R 5th toe today. Line of raw skin over lateral surface that is bleeding scantly. Medially the skin is raw on side of R 5th toe touching 4th toe.  Good pulses in the foot and sensation grossly intact in rest of foot.   Neurologic: alert & oriented X3, cranial nerves II-XII intact, strength grossly intact, sensation intact to light touch    Lab Results: Basic Metabolic Panel:  Lab [id number] 0510 05/19/11 0541  NA 133* 135  K 4.6 4.4  CL 94* 100  CO2 28 27  GLUCOSE 313* 213*  BUN 14 16  CREATININE 1.07 1.11  CALCIUM 9.9 9.6  MG -- --  PHOS -- --   Liver Function Tests:  Lab 05/18/11 2036  AST 13  ALT 14  ALKPHOS 72  BILITOT 0.3  PROT 7.3  ALBUMIN 3.2*   CBC:  Lab 05/20/11 0510 05/19/11 0541  WBC 6.2 5.9  NEUTROABS -- --  HGB 12.5* 11.8*  HCT 36.2* 35.4*  MCV 86.8 88.3  PLT 318 292   CBG:  Lab 05/23/11 0629 05/22/11 2156 05/22/11 1642 05/22/11 1141 05/22/11 0557 05/21/11 2148  GLUCAP 323* 174* 309* 301* 326* 185*     Micro Results: Recent Results (from the past 240 hour(s))  CULTURE, BLOOD (ROUTINE X 2)     Status: Normal  (Preliminary result)   Collection Time   05/18/11  2:25 PM      Component Value Range Status Comment   Specimen Description BLOOD ARM LEFT   Final    Special Requests BOTTLES DRAWN AEROBIC AND ANAEROBIC 10CC   Final    Culture  Setup Time [id number]   Final    Culture     Final    Value:        BLOOD CULTURE RECEIVED NO GROWTH TO DATE CULTURE WILL BE HELD FOR 5 DAYS BEFORE ISSUING A FINAL NEGATIVE REPORT   Report Status PENDING   Incomplete   CULTURE, BLOOD (ROUTINE X 2)     Status: Normal (Preliminary result)   Collection Time   05/18/11  2:40 PM      Component Value Range Status Comment   Specimen Description BLOOD ARM RIGHT   Final    Special Requests BOTTLES DRAWN AEROBIC AND ANAEROBIC 10CC   Final    Culture  Setup Time [id number]   Final    Culture     Final    Value:        BLOOD CULTURE RECEIVED NO GROWTH TO DATE CULTURE WILL BE HELD FOR 5 DAYS BEFORE  ISSUING A FINAL NEGATIVE REPORT   Report Status PENDING   Incomplete    Medications: I have reviewed the patient's current medications. Scheduled Meds:   . enoxaparin  40 mg Subcutaneous Q24H  . gadobenate dimeglumine  18 mL Intravenous Once  . hydrochlorothiazide  25 mg Oral Daily  . insulin aspart  0-5 Units Subcutaneous QHS  . insulin aspart  0-9 Units Subcutaneous TID WC  . insulin aspart  10 Units Subcutaneous TID WC  . insulin glargine  32 Units Subcutaneous QHS  . lisinopril  20 mg Oral Daily  . piperacillin-tazobactam (ZOSYN)  IV  3.375 g Intravenous Q8H  . simvastatin  20 mg Oral q1800  . vancomycin  1,000 mg Intravenous Q8H   Continuous Infusions:  PRN Meds:.acetaminophen, acetaminophen, HYDROcodone-acetaminophen    Assessment/Plan:  LOS: 5 days    The patient is a 51 yo man, history of poorly-controlled DM, presenting with a 10-day history of a R 5th toe ulcer, found to have osteomyelitis.   # Right 5th metatarsal ulcer - MRI performed found osteomyelitis of middle and proximal phalanx of R 5th toe.  Ortho is following for possible surgical management, for now we are just watching for possible improvement. 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 No fevers, no leukocytosis. No change from yesterday.  -continue IV vanc/zosyn  -wound care while inpatient and at discharge  -Non-weightbearing R foot. If patient has to weight bear to get to bathroom he should wear postop boot. Wear postop boot whenever out of bed.  -This is being managed by ortho  # Uncontrolled Diabetes Mellitus with peripheral neuropathy: CBGs still too high. Requiring a lot of sliding scale.  Since we made increases last two days will observe for today and adjust again tomorrow.    Hb A1C > 14 (05/12/11). Patient had been out of all DM medications for several months, but was recently prescribed lantus 30 qhs on 05/12/11.   -Lantus 32 units QHS, Novolog 10 units TID with meals -SSI changed to sensitive to avoid overdosing insulin at meals.   # Hypertension - on home lisinopril-HCTZ  -continue lisinopril and HCTZ since BPs look ok on home regimen in hospital   # Hyperlipidemia - LDL = 146 (05/12/11). Patient previously on no statin  -started simvastatin during this hospitalization  -plan to discharge home on pravastatin (as pt has financial difficulty)   # Prophy - lovenox   # Disposition: Patient's primary problem is being managed by orthopaedic surgery consultant, so disposition is dependent on their plan for this patient      Providence Lanius 05/23/2011, 9:10 AM
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
## 2011-05-23 NOTE — Discharge Instructions (Signed)
Plan for his postoperative shoe with dial soap cleansing to the little toe daily and antibiotic ointment dressing changes daily. Weightbearing as tolerated right lower extremity.

## 2011-05-24 ENCOUNTER — Encounter: Payer: Self-pay | Admitting: Internal Medicine

## 2011-05-24 LAB — CULTURE, BLOOD (ROUTINE X 2)
Culture  Setup Time: 201305142208
Culture: NO GROWTH
Culture: NO GROWTH

## 2011-05-24 LAB — BASIC METABOLIC PANEL
Calcium: 9.6 mg/dL (ref 8.4–10.5)
Creatinine, Ser: 0.92 mg/dL (ref 0.50–1.35)
GFR calc Af Amer: >90 mL/min (ref >90)
GFR calc non Af Amer: >90 mL/min (ref >90)
Glucose, Bld: 311 mg/dL — ABNORMAL HIGH (ref 70–99)
Potassium: 4.4 mEq/L (ref 3.5–5.1)
Sodium: 132 mEq/L — ABNORMAL LOW (ref 135–145)

## 2011-05-24 LAB — GLUCOSE, CAPILLARY: Glucose-Capillary: 203 mg/dL — ABNORMAL HIGH (ref 70–99)

## 2011-05-24 MED ORDER — INSULIN ASPART 100 UNIT/ML ~~LOC~~ SOLN: 12.0000 [IU] | Freq: Three times a day (TID) | SUBCUTANEOUS | Status: DC

## 2011-05-24 MED ORDER — ACETAMINOPHEN 325 MG PO TABS: 650.0000 mg | ORAL_TABLET | Freq: Four times a day (QID) | ORAL | Status: DC | PRN

## 2011-05-24 MED ORDER — AMOXICILLIN-POT CLAVULANATE 875-125 MG PO TABS: 1.0000 | ORAL_TABLET | Freq: Two times a day (BID) | ORAL | Status: AC

## 2011-05-24 MED ORDER — INSULIN GLARGINE 100 UNIT/ML ~~LOC~~ SOLN: 36.0000 [IU] | Freq: Every day | SUBCUTANEOUS | Status: DC

## 2011-05-24 MED ORDER — ASPIRIN 81 MG PO TBEC: 81.0000 mg | DELAYED_RELEASE_TABLET | Freq: Every day | ORAL | Status: DC

## 2011-05-24 MED ORDER — PRAVASTATIN SODIUM 40 MG PO TABS: 40.0000 mg | ORAL_TABLET | Freq: Every day | ORAL | Status: DC

## 2011-05-24 NOTE — Progress Notes (Signed)
Subjective:  No overnight events. Minimal pain.     Objective: Vital signs in last 24 hours: Filed Vitals:   05/23/11 0535 05/23/11 1444 05/23/11 2131 05/24/11 0624  BP: 147/94 137/85 125/81 114/74  Pulse: 92 90 87 88  Temp: 97.9 F (36.6 C) 98.7 F (37.1 C) 98.8 F (37.1 C) 97.9 F (36.6 C)  TempSrc: Oral Oral Oral Oral  Resp: 18 18 18 18   Height:      Weight:      SpO2: 99% 99% 99% 99%    General: alert, cooperative, and in no apparent distress  HEENT: pupils equal round and reactive to light, vision grossly intact, oropharynx clear and non-erythematous  Lungs: clear to ascultation bilaterally, normal work of respiration, no wheezes, rales, ronchi  Heart: regular rate and rhythm, no murmurs, gallops, or rubs  Extremities: no cyanosis, clubbing, or edema.  R 5th toe is diffusely dusky.  Patient has no sensation in R 5th toe today.  Line of raw skin over lateral surface that is bleeding scantly. Medially the skin is raw on side of R 5th toe touching 4th toe.  Good pulses in the foot and sensation grossly intact in rest of foot.   Neurologic: alert & oriented X3, cranial nerves II-XII intact, strength grossly intact, sensation intact to light touch      Lab Results: Basic Metabolic Panel:  Lab [id number] 05/20/11 0510  NA 132* 133*  K 4.4 4.6  CL 94* 94*  CO2 26 28  GLUCOSE 311* 313*  BUN 19 14  CREATININE 0.92 1.07  CALCIUM 9.6 9.9  MG -- --  PHOS -- --   Liver Function Tests:  Lab 05/18/11 2036  AST 13  ALT 14  ALKPHOS 72  BILITOT 0.3  PROT 7.3  ALBUMIN 3.2*   CBC:  Lab 05/20/11 0510 05/19/11 0541  WBC 6.2 5.9  NEUTROABS -- --  HGB 12.5* 11.8*  HCT 36.2* 35.4*  MCV 86.8 88.3  PLT 318 292   CBG:  Lab 05/24/11 0622 05/23/11 2129 05/23/11 1632 05/23/11 1124 05/23/11 0629 05/22/11 2156  GLUCAP 203* 256* 281* 308* 323* 174*   Urine Drug Screen: Drugs of Abuse     Component Value Date/Time   LABOPIA POSITIVE* 03/20/2009 0455   COCAINSCRNUR NONE DETECTED 03/20/2009 0455   LABBENZ NONE DETECTED 03/20/2009 0455   AMPHETMU NONE DETECTED 03/20/2009 0455   THCU NONE DETECTED 03/20/2009 0455   LABBARB  Value: NONE DETECTED        DRUG SCREEN FOR MEDICAL PURPOSES ONLY.  IF CONFIRMATION IS NEEDED FOR ANY PURPOSE, NOTIFY LAB WITHIN 5 DAYS.        LOWEST DETECTABLE LIMITS FOR URINE DRUG SCREEN Drug Class       Cutoff (ng/mL) Amphetamine      1000 Barbiturate      200 Benzodiazepine   [id number] Tricyclics       [id number] Opiates          300 Cocaine          300 THC              50 03/20/2009 0455      Micro Results: Recent Results (from the past 240 hour(s))  CULTURE, BLOOD (ROUTINE X 2)     Status: Normal   Collection Time   05/18/11  2:25 PM      Component Value Range Status Comment   Specimen Description BLOOD ARM LEFT   Final    Special Requests BOTTLES DRAWN  AEROBIC AND ANAEROBIC 10CC   Final    Culture  Setup Time [id number]   Final    Culture NO GROWTH 5 DAYS   Final    Report Status 05/24/2011 FINAL   Final   CULTURE, BLOOD (ROUTINE X 2)     Status: Normal   Collection Time   05/18/11  2:40 PM      Component Value Range Status Comment   Specimen Description BLOOD ARM RIGHT   Final    Special Requests BOTTLES DRAWN AEROBIC AND ANAEROBIC 10CC   Final    Culture  Setup Time [id number]   Final    Culture NO GROWTH 5 DAYS   Final    Report Status 05/24/2011 FINAL   Final    Medications: meds reviewed Scheduled Meds:   . enoxaparin  40 mg Subcutaneous Q24H  . gadobenate dimeglumine  18 mL Intravenous Once  . hydrochlorothiazide  25 mg Oral Daily  . insulin aspart  0-5 Units Subcutaneous QHS  . insulin aspart  0-9 Units Subcutaneous TID WC  . insulin aspart  10 Units Subcutaneous TID WC  . insulin glargine  32 Units Subcutaneous QHS  . lisinopril  20 mg Oral Daily  . piperacillin-tazobactam (ZOSYN)  IV  3.375 g Intravenous Q8H  . simvastatin  20 mg Oral q1800  . vancomycin  1,000 mg Intravenous Q8H   Continuous  Infusions:  PRN Meds:.acetaminophen, acetaminophen, HYDROcodone-acetaminophen Assessment/Plan:  LOS: 6 days   The patient is a 51 yo man, history of poorly-controlled DM, presenting with a 10-day history of a R 5th toe ulcer, found to have osteomyelitis.   # Right 5th metatarsal ulcer - MRI performed found osteomyelitis of middle and proximal phalanx of R 5th toe. Ortho is following for possible surgical management, for now we are just watching for possible improvement. 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 No fevers, no leukocytosis. No change from yesterday.   -Ortho recommended 6 wks abx and observation.  Fu with Dr Sharol Given scheduled for 2 wks.  6 wks of Augmentin prescribed on discharge.  Fu with [facility] for diabetes.   -Can return to work with wide toed shoes and orthotics. -This is being managed by ortho   # Uncontrolled Diabetes Mellitus with peripheral neuropathy: CBGs still too high.  -Discharge on metformin, Lantus 36 units QHS, Novolog 12 units TID with meals.  Fu with [facility] clinic and diabetes educator.      # Hypertension - on home lisinopril-HCTZ  -continue lisinopril and HCTZ since BPs look ok on home regimen in hospital   # Hyperlipidemia - LDL = 146 (05/12/11). Patient previously on no statin  -started simvastatin during this hospitalization  -plan to discharge home on pravastatin (as pt has financial difficulty)   # Prophy - lovenox   # Disposition: Discharge home today for 6 wks outpt abx and observation    Isley Zinni, BRAD 05/24/2011, 12:00 PM

## 2011-05-24 NOTE — Progress Notes (Signed)
Inpatient Diabetes Program Recommendations  AACE/ADA: New Consensus Statement on Inpatient Glycemic Control (2009)  Target Ranges:  Prepandial:   less than 140 mg/dL      Peak postprandial:   less than 180 mg/dL (1-2 hours)      Critically ill patients:  140 - 180 mg/dL   Reason for Visit: Elevated HgbA1C of greater than 14% Pt may benefit from initiation of Incretin mimetic, Byetta rather than adding more insulin which  most probably will potentiate hunger.   Start with minimum dose of 0.5 mcg 60 minutes prior to breakfast and supper. This would decrease hunger, promote weight loss, and assist to lower A1C.     Note: Pt is used to taking injections, therefore this injection would not be a problem rather than adding meal coverage. Thank you, Rosita Kea, RN, CNS, Diabetes Coordinator [phone])

## 2011-05-24 NOTE — Progress Notes (Signed)
ANTIBIOTIC CONSULT NOTE - FOLLOW UP  Pharmacy Consult for Vancomycin and Zosyn Indication: Osteomyelitis of foot/toe  No Known Allergies  Patient Measurements: Height: 6' (182.9 cm) Weight: 185 lb 3 oz (84 kg) IBW/kg (Calculated) : 77.6    Vital Signs: Temp: 97.9 F (36.6 C) (05/20 0624) Temp src: Oral (05/20 0624) BP: 114/74 mmHg (05/20 0624) Pulse Rate: 88  (05/20 0624) Intake/Output from previous day: 05/19 0701 - 05/20 0700 In: 2320 [P.O.:1920; I.V.:150; IV Piggyback:250] Out: 5 [Urine:5]  Labs:Estimated Creatinine Clearance: 89.6 ml/min (by C-G formula based on Cr of 1.07).    Microbiology: Recent Results (from the past 720 hour(s))  CULTURE, BLOOD (ROUTINE X 2)     Status: Normal (Preliminary result)   Collection Time   05/18/11  2:25 PM      Component Value Range Status Comment   Specimen Description BLOOD ARM LEFT   Final    Special Requests BOTTLES DRAWN AEROBIC AND ANAEROBIC 10CC   Final    Culture  Setup Time [id number]   Final    Culture     Final    Value:        BLOOD CULTURE RECEIVED NO GROWTH TO DATE CULTURE WILL BE HELD FOR 5 DAYS BEFORE ISSUING A FINAL NEGATIVE REPORT   Report Status PENDING   Incomplete   CULTURE, BLOOD (ROUTINE X 2)     Status: Normal (Preliminary result)   Collection Time   05/18/11  2:40 PM      Component Value Range Status Comment   Specimen Description BLOOD ARM RIGHT   Final    Special Requests BOTTLES DRAWN AEROBIC AND ANAEROBIC 10CC   Final    Culture  Setup Time [id number]   Final    Culture     Final    Value:        BLOOD CULTURE RECEIVED NO GROWTH TO DATE CULTURE WILL BE HELD FOR 5 DAYS BEFORE ISSUING A FINAL NEGATIVE REPORT   Report Status PENDING   Incomplete     Anti-infectives     Start     Dose/Rate Route Frequency Ordered Stop   05/18/11 1400   piperacillin-tazobactam (ZOSYN) IVPB 3.375 g        3.375 g 12.5 mL/hr over 240 Minutes Intravenous 3 times per day 05/18/11 1332     05/18/11 1400   vancomycin  (VANCOCIN) IVPB 1000 mg/200 mL premix        1,000 mg 200 mL/hr over 60 Minutes Intravenous Every 8 hours 05/18/11 1332            Assessment: 51yom on Vancomycin and Zosyn Day 6 for MRI confirmed osteomyelitis of toe. Patient has remained afebrile, WBC wnl, blood cultures have reported NGTD and renal function has remained stable. Ortho has been consulted and is recommending po abx for 6 weeks and follow up with no plans for amputation given high risk of complications and poor recovery outlook. Vancomycin trough was therapeutic at 15.4 on 5/17, will continue  Current regimen until a po regimen is started.   Goal of Therapy:  Vancomycin trough level 15-20 mcg/ml  Plan:  1. Continue Vancomycin 1g IV q8h 2. Continue Zosyn 3.375g IV q8h 3. Follow-up po abx plan  Georgina Peer 05/24/2011,9:16 AM

## 2011-05-25 ENCOUNTER — Telehealth: Payer: Self-pay | Admitting: Internal Medicine

## 2011-05-25 LAB — GLUCOSE, CAPILLARY: Glucose-Capillary: 244 mg/dL — ABNORMAL HIGH (ref 70–99)

## 2011-05-25 NOTE — Telephone Encounter (Signed)
[name] called the [facility] clinic due to pain following discharge yesterday.  R foot pain from his 5th toe osteomyelitis that is being observed for 6 wks now as outpatient on PO abx.  Tylenol has provided insufficient pain relief.    I recommended that [name] take over the counter ibuprofen, three tablets (600mg ) by mouth every 8 hours in addition to Tylenol.  If these two are not sufficient when used in combination, I instructed [name] to call back the clinic, and I would consider prescribing tramadol.   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

## 2011-05-25 NOTE — Discharge Summary (Signed)
[facility] Discharge Note  Name: [name] MRN: [id number] DOB: 13-Apr-1960 51 y.o.  Date of Admission: 05/18/2011 12:07 PM Date of Discharge: 05/24/2011 Attending Physician: Larey Dresser  Discharge Diagnosis: Osteomyelitis Right 5th Toe Type 2 diabetes mellitus with right diabetic foot ulcer HYPERLIPIDEMIA HYPERTENSION   Discharge Medications: Medication List  As of 05/25/2011 10:42 AM   STOP taking these medications         aspirin 325 MG tablet      cephALEXin 500 MG capsule      lisinopril 20 MG tablet      sulfamethoxazole-trimethoprim 800-160 MG per tablet         TAKE these medications         acetaminophen 325 MG tablet   Commonly known as: TYLENOL   Take 2 tablets (650 mg total) by mouth every 6 (six) hours as needed (or Fever >/= 101).      amoxicillin-clavulanate 875-125 MG per tablet   Commonly known as: AUGMENTIN   Take 1 tablet by mouth 2 (two) times daily.      aspirin 81 MG EC tablet   Take 1 tablet (81 mg total) by mouth daily. Swallow whole.      insulin aspart 100 UNIT/ML injection   Commonly known as: novoLOG   Inject 12 Units into the skin 3 (three) times daily with meals.      insulin glargine 100 UNIT/ML injection   Commonly known as: LANTUS   Inject 36 Units into the skin at bedtime.      lisinopril-hydrochlorothiazide 20-25 MG per tablet   Commonly known as: PRINZIDE,ZESTORETIC   Take 1 tablet by mouth daily.      metFORMIN 1000 MG tablet   Commonly known as: GLUCOPHAGE   Take 1,000 mg by mouth 2 (two) times daily with a meal.      pravastatin 40 MG tablet   Commonly known as: PRAVACHOL   Take 1 tablet (40 mg total) by mouth daily.            Disposition and follow-up:   [name] was discharged from [facility] in Stable condition.  At the hospital follow up visit please address his diabetes regimen which was recently restarted.  History of poor adherence to  insulin.    Follow-up Appointments: Follow-up Information    Follow up with [name], MD on 06/07/2011. (12:45pm)    Contact information:   [place] [phone]       Follow up with Jolene Provost, MD on 06/01/2011. (3:15pm)    Contact information:   [place] [phone]       Follow up with Plyler, [name], RD on 06/08/2011. (1:30pm)    Contact information:   [place] [phone]         Discharge Orders    Future Appointments: Provider: Department: Dept Phone: Center:   06/01/2011 3:15 PM Jolene Provost, MD [facility] [phone] [facility]   06/08/2011 1:30 PM Laurel Run, RD [facility] [phone] [facility]   06/24/2011 8:00 AM [facility] [id number] [facility]     Future Orders Please Complete By Expires   Diet - low sodium heart healthy      Increase activity slowly      Other Restrictions      Comments:   Wear toes with lots of room for your  toes.  Sneakers are ok.  Please wear your orthotics.   Change dressing (specify)      Comments:   Dressing change: daily.  Dress it same way as nurses have in the hospital   Call MD for:  severe uncontrolled pain      Call MD for:  temperature >100.4         Consultations:  Orthopaedic Surgery  Procedures Performed:  Mr Foot Right W Wo Contrast  05/20/2011  *RADIOLOGY REPORT*  Clinical Data: Right foot ulcer.  Osteomyelitis.  MRI OF THE RIGHT FOREFOOT WITHOUT AND WITH CONTRAST  Technique:  Multiplanar, multisequence MR imaging was performed both before and after administration of intravenous contrast.  Contrast:  18 ml Multihance.  Comparison: Radiographs 05/18/2011.  Findings: Nonspecific diffuse increased T2 signal is present within that musculature, which may represent diabetic denervation muscular changes.  Dorsal subcutaneous edema is present in the foot  without a soft tissue abscess.  Findings suggest either dependent edema or cellulitis in the appropriate clinical setting.  There is bone marrow edema in the distal aspect of the proximal phalanx of the small toe with an overlying lateral ulceration. This is consistent with osteomyelitis.  Bone marrow edema is also present.  On post-gadolinium imaging, the proximal phalanx and middle phalanx enhance, compatible with osteomyelitis rather than reactive change.  Partial imaging of the ankle was also performed.  There is a large osteochondral lesion of the posterior medial talar dome. Subchondral edema and cystic changes present.  The lesion measures at least 7 mm.  Chronic degenerative changes are present in the medial malleolus, likely associated with chronic abutment and instability.  The deltoid ligament is poorly defined with inflammatory changes around the medial aspect of the ankle. 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  There is bone marrow edema in the calcaneus and mild enhancement which probably relates to altered weightbearing.  On axial images, there is focal fibromatosis of the plantar fascia involving the medial forefoot.  This is plantar to the fourth and fifth metatarsal bases.  This focal area of fibromatosis measures 8 mm plantar and dorsal, 17 mm transverse and 19 cm mm long axis. The peroneal tendons appear within normal limits.  IMPRESSION: 1.  Osteomyelitis of the middle and proximal phalanx of the right small toe.   Metatarsal appears within normal limits. 2.  Diabetic denervation atrophy and edema in the foot musculature. 3.  Diffuse subcutaneous edema likely representing cellulitis in the appropriate clinical setting. 4.  Osteochondral lesion of the medial talar dome with associated cystic changes in the medial malleolus, compatible with chronic abutment. Poor definition of the deltoid ligament suggest chronic deltoid tear.  The study was not optimized for evaluation of the ankle ligaments.  If there  is an ongoing concern, MRI of the ankle may be performed.  5.  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  Original Report Authenticated By: Dereck Ligas, M.D.   Dg Foot Complete Right  05/18/2011  *RADIOLOGY REPORT*  Clinical Data: Lesion along the right fifth toe laterally, the patient is diabetic  RIGHT FOOT COMPLETE - 3+ VIEW  Comparison: None.  Findings: Tarsometatarsal alignment is normal.  Joint spaces appear normal.  No abnormality of the mineralization of the fifth toe is seen.  IMPRESSION: Negative.  Original Report Authenticated By: Joretta Bachelor, M.D.    Admission HPI:  Patient is a 51 y.o. 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 He noticed ulcer on right 5th toe since 4th May . The ulcer was associated with pain for which he took some ibuprofen. He received wound care at [facility] at that time. He says that he noticed that he had not wore diabetic foot inserts in his boot for a day after he started having pain in his right foot at the end of the day.  He presented today in clinic for follow up of a diabetic foot ulcer of his fifth toe on his right foot. He was seen on 05/12/2011, during which visit he was given cephalexin, Bactrim, and instructions to call the wound clinic. He was under the impression that the wound clinic will contact him, so he's been packing the wound himself. He reports that he is used to wound packing given history of osteomyelitis of the left great toe. He has been compliant with both cephalexin and Bactrim, and he has bottles of both medications with him today.  She reports that the wound is getting worse, and the throbbing pain has escalated. There is more redness extending up his foot, accompanied with more bloody and purulent discharge from his toe. He denies fever/chills. Patient was admitted directly from clinic for IV antibiotics and  further management.   Hospital Course by problem list:  Osteomyelitis Right 5th Toe: Admitted for 10 day history of R 5th toe ulcer that started when he did not wear his shoe orthotic and was on his feet for many hours at once.  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  Was first followed in clinic and given PO cephalexin and bactrim without significant improvement, so he was admitted to hospital on our service.  MRI showed evidence of likely osteomyelitis in proximal and middle phalanx of R 5th toe.  He was treated with 6 days of broad spectrum Vancomycin + Zosyn.  During this time, the appearance of his toe did not significantly change.  He was also followed by orthopaedic surgery consultant during this hospitalization.  Dr. Sharol Given decided on further observation as an outpatient.  Patient discharged on Augmentin for 6 weeks, follow-up scheduled next week with Dr. Sharol Given.  Patient may return to work on Monday following discharge with good comfortable footwear with wide areas for toes and plenty of cushioning.  Tylenol and OTC ibuprofen for pain control since he had minimal pain in the hospital.  Type 2 diabetes mellitus with right diabetic foot ulcer: HbA1c >14% on 05/12/2011.  He reports being off insulin for over a month due to finances, but the clinic prior to admission got him Lantus and novolog affordably.  His sugars were high in 200s mostly in the hospital despite mealtime novolog and lantus.  His regimen was slowly titrated upwards, and he was discharged on Lantus 36 units QHS and Novolog 12 units TID with meals.  He has appt with [facility] for follow-up as well as diabetes coordinator there.  His DM should be monitored as I suspect his regimen will need to be increased further.    Hyperlipidemia: LDL was 146 05/12/2011 on no statin per admission team.  Discharged on Pravastatin 40mg  PO daily to make sure he is on an affordable medication.      Discharge Vitals:   BP 123/73  Pulse 100  Temp(Src) 97.9 F (36.6 C) (Oral)  Resp 20  Ht 6' (1.829 m)  Wt 185 lb 3 oz (84 kg)  BMI 25.12 kg/m2  SpO2  100%  Admission on 05/18/2011, Discharged on 05/24/2011  Component Date Value Range Status  . Glucose-Capillary (mg/dL) 05/18/2011 340* 70-99 Final  . Comment 1  05/18/2011 Notify RN   Final  . Specimen Description  05/18/2011 BLOOD ARM LEFT   Final  . Special Requests  05/18/2011 BOTTLES DRAWN AEROBIC AND ANAEROBIC 10CC   Final  . Culture  Setup Time  05/18/2011 [id number]   Final  . Culture  05/18/2011 NO GROWTH 5 DAYS   Final  . Report Status  05/18/2011 05/24/2011 FINAL   Final  . Specimen Description  05/18/2011 BLOOD ARM RIGHT   Final  . Special Requests  05/18/2011 BOTTLES DRAWN AEROBIC AND ANAEROBIC 10CC   Final  . Culture  Setup Time  05/18/2011 [id number]   Final  . Culture  05/18/2011 NO GROWTH 5 DAYS   Final  . Report Status  05/18/2011 05/24/2011 FINAL   Final  . Glucose-Capillary (mg/dL) 05/18/2011 259* 70-99 Final  . Comment 1  05/18/2011 Notify RN   Final  . WBC (K/uL) 05/19/2011 5.9  4.0-10.5 Final  . RBC (MIL/uL) 05/19/2011 4.01* 4.22-5.81 Final  . Hemoglobin (g/dL) 05/19/2011 11.8* 13.0-17.0 Final  . HCT (%) 05/19/2011 35.4* 39.0-52.0 Final  . MCV (fL) 05/19/2011 88.3  78.0-100.0 Final  . MCH (pg) 05/19/2011 29.4  26.0-34.0 Final  . MCHC (g/dL) 05/19/2011 33.3  30.0-36.0 Final  . RDW (%) 05/19/2011 11.8  11.5-15.5 Final  . Platelets (K/uL) 05/19/2011 292  150-400 Final  . Sodium (mEq/L) 05/19/2011 135  135-145 Final  . Potassium (mEq/L) 05/19/2011 4.4  3.5-5.1 Final  . Chloride (mEq/L) 05/19/2011 100  96-112 Final  . CO2 (mEq/L) 05/19/2011 27  19-32 Final  . Glucose, Bld (mg/dL) 05/19/2011 213* 70-99 Final  . BUN (mg/dL) 05/19/2011 16  6-23 Final  . Creatinine, Ser (mg/dL) 05/19/2011 1.11  0.50-1.35 Final  . Calcium (mg/dL) 05/19/2011 9.6  8.4-10.5 Final  . GFR calc non Af Amer (mL/min) 05/19/2011 75* >90 Final  . GFR calc  Af Amer (mL/min) 05/19/2011 87* >90 Final   Comment:                                 The eGFR has been calculated                          using the CKD EPI equation.                          This calculation has not been                          validated in all clinical                          situations.                          eGFR's persistently                          <90 mL/min signify                          possible Chronic Kidney Disease.  . WBC (K/uL) 05/18/2011 7.1  4.0-10.5 Final  .  RBC (MIL/uL) 05/18/2011 4.02* 4.22-5.81 Final  . Hemoglobin (g/dL) 05/18/2011 12.0* 13.0-17.0 Final  . HCT (%) 05/18/2011 34.8* 39.0-52.0 Final  . MCV (fL) 05/18/2011 86.6  78.0-100.0 Final  . MCH (pg) 05/18/2011 29.9  26.0-34.0 Final  . MCHC (g/dL) 05/18/2011 34.5  30.0-36.0 Final  . RDW (%) 05/18/2011 11.7  11.5-15.5 Final  . Platelets (K/uL) 05/18/2011 307  150-400 Final  . Sodium (mEq/L) 05/18/2011 134* 135-145 Final  . Potassium (mEq/L) 05/18/2011 4.0  3.5-5.1 Final  . Chloride (mEq/L) 05/18/2011 95* 96-112 Final  . CO2 (mEq/L) 05/18/2011 28  19-32 Final  . Glucose, Bld (mg/dL) 05/18/2011 182* 70-99 Final  . BUN (mg/dL) 05/18/2011 19  6-23 Final  . Creatinine, Ser (mg/dL) 05/18/2011 1.20  0.50-1.35 Final  . Calcium (mg/dL) 05/18/2011 9.9  8.4-10.5 Final  . Total Protein (g/dL) 05/18/2011 7.3  6.0-8.3 Final  . Albumin (g/dL) 05/18/2011 3.2* 3.5-5.2 Final  . AST (U/L) 05/18/2011 13  0-37 Final  . ALT (U/L) 05/18/2011 14  0-53 Final  . Alkaline Phosphatase (U/L) 05/18/2011 72  39-117 Final  . Total Bilirubin (mg/dL) 05/18/2011 0.3  0.3-1.2 Final  . GFR calc non Af Amer (mL/min) 05/18/2011 68* >90 Final  . GFR calc Af Amer (mL/min) 05/18/2011 79* >90 Final   Comment:                                 The eGFR has been calculated                          using the CKD EPI equation.                          This calculation has not been                          validated in all  clinical                          situations.                          eGFR's persistently                          <90 mL/min signify                          possible Chronic Kidney Disease.  . Glucose-Capillary (mg/dL) 05/18/2011 188* 70-99 Final  . Comment 1  05/18/2011 Notify RN   Final  . Glucose-Capillary (mg/dL) 05/19/2011 240* 70-99 Final  . Comment 1  05/19/2011 Notify RN   Final  . Glucose-Capillary (mg/dL) 05/19/2011 191* 70-99 Final  . Comment 1  05/19/2011 Notify RN   Final  . Sodium (mEq/L) 05/20/2011 133* 135-145 Final  . Potassium (mEq/L) 05/20/2011 4.6  3.5-5.1 Final  . Chloride (mEq/L) 05/20/2011 94* 96-112 Final  . CO2 (mEq/L) 05/20/2011 28  19-32 Final  . Glucose, Bld (mg/dL) 05/20/2011 313* 70-99 Final  . BUN (mg/dL) 05/20/2011 14  6-23 Final  . Creatinine, Ser (mg/dL) 05/20/2011 1.07  0.50-1.35 Final  . Calcium (mg/dL) 05/20/2011 9.9  8.4-10.5 Final  . GFR calc non Af Amer (mL/min) 05/20/2011 79* >90 Final  . GFR  calc Af Amer (mL/min) 05/20/2011 >90  >90 Final   Comment:                                 The eGFR has been calculated                          using the CKD EPI equation.                          This calculation has not been                          validated in all clinical                          situations.                          eGFR's persistently                          <90 mL/min signify                          possible Chronic Kidney Disease.  . WBC (K/uL) 05/20/2011 6.2  4.0-10.5 Final  . RBC (MIL/uL) 05/20/2011 4.17* 4.22-5.81 Final  . Hemoglobin (g/dL) 05/20/2011 12.5* 13.0-17.0 Final  . HCT (%) 05/20/2011 36.2* 39.0-52.0 Final  . MCV (fL) 05/20/2011 86.8  78.0-100.0 Final  . MCH (pg) 05/20/2011 30.0  26.0-34.0 Final  . MCHC (g/dL) 05/20/2011 34.5  30.0-36.0 Final  . RDW (%) 05/20/2011 11.6  11.5-15.5 Final  . Platelets (K/uL) 05/20/2011 318  150-400 Final  . Glucose-Capillary (mg/dL) 05/19/2011 148* 70-99 Final  . Comment 1   05/19/2011 Notify RN   Final  . Glucose-Capillary (mg/dL) 05/19/2011 234* 70-99 Final  . Comment 1  05/19/2011 Notify RN   Final  . Vancomycin Tr (ug/mL) 05/21/2011 15.4  10.0-20.0 Final  . Glucose-Capillary (mg/dL) 05/20/2011 288* 70-99 Final  . Comment 1  05/20/2011 Notify RN   Final  . Glucose-Capillary (mg/dL) 05/20/2011 276* 70-99 Final  . Comment 1  05/20/2011 Documented in Chart   Final  . Comment 2  05/20/2011 Notify RN   Final  . Glucose-Capillary (mg/dL) 05/20/2011 231* 70-99 Final  . Comment 1  05/20/2011 Documented in Chart   Final  . Comment 2  05/20/2011 Notify RN   Final  . Glucose-Capillary (mg/dL) 05/20/2011 353* 70-99 Final  . Comment 1  05/20/2011 Documented in Chart   Final  . Comment 2  05/20/2011 Notify RN   Final  . Glucose-Capillary (mg/dL) 05/20/2011 356* 70-99 Final  . Glucose-Capillary (mg/dL) 05/21/2011 359* 70-99 Final  . Glucose-Capillary (mg/dL) 05/21/2011 264* 70-99 Final  . Comment 1  05/21/2011 Documented in Chart   Final  . Comment 2  05/21/2011 Notify RN   Final  . Glucose-Capillary (mg/dL) 05/21/2011 364* 70-99 Final  . Comment 1  05/21/2011 Documented in Chart   Final  . Comment 2  05/21/2011 Notify RN   Final  . Glucose-Capillary (mg/dL) 05/21/2011 185* 70-99 Final  . Glucose-Capillary (mg/dL) 05/22/2011 326* 70-99 Final  . Glucose-Capillary (mg/dL) 05/22/2011 301* 70-99 Final  . Glucose-Capillary (mg/dL) 05/22/2011 309* 70-99 Final  . Comment  1  05/22/2011 Documented in Chart   Final  . Comment 2  05/22/2011 Notify RN   Final  . Glucose-Capillary (mg/dL) 05/22/2011 174* 70-99 Final  . Glucose-Capillary (mg/dL) 05/23/2011 323* 70-99 Final  . Glucose-Capillary (mg/dL) 05/23/2011 308* 70-99 Final  . Glucose-Capillary (mg/dL) 05/23/2011 281* 70-99 Final  . Sodium (mEq/L) 05/24/2011 132* 135-145 Final  . Potassium (mEq/L) 05/24/2011 4.4  3.5-5.1 Final  . Chloride (mEq/L) 05/24/2011 94* 96-112 Final  . CO2 (mEq/L) 05/24/2011 26  19-32 Final  .  Glucose, Bld (mg/dL) 05/24/2011 311* 70-99 Final  . BUN (mg/dL) 05/24/2011 19  6-23 Final  . Creatinine, Ser (mg/dL) 05/24/2011 0.92  0.50-1.35 Final  . Calcium (mg/dL) 05/24/2011 9.6  8.4-10.5 Final  . GFR calc non Af Amer (mL/min) 05/24/2011 >90  >90 Final  . GFR calc Af Amer (mL/min) 05/24/2011 >90  >90 Final   Comment:                                 The eGFR has been calculated                          using the CKD EPI equation.                          This calculation has not been                          validated in all clinical                          situations.                          eGFR's persistently                          <90 mL/min signify                          possible Chronic Kidney Disease.  . Glucose-Capillary (mg/dL) 05/23/2011 256* 70-99 Final  . Glucose-Capillary (mg/dL) 05/24/2011 203* 70-99 Final  . Glucose-Capillary (mg/dL) 05/24/2011 244* 70-99 Final  . Comment 1  05/24/2011 Documented in Chart   Final  . Comment 2  05/24/2011 Notify RN   Final  ]  Discharge Labs:  Results for orders placed during the hospital encounter of 05/18/11 (from the past 24 hour(s))  GLUCOSE, CAPILLARY     Status: Abnormal   Collection Time   05/24/11 11:26 AM      Component Value Range   Glucose-Capillary 244 (*) 70 - 99 (mg/dL)   Comment 1 Documented in Chart     Comment 2 Notify RN      SignedOrvilla Fus, BRAD 05/25/2011, 10:42 AM   Time Spent on Discharge: 35 minutes

## 2011-05-26 ENCOUNTER — Telehealth: Payer: Self-pay | Admitting: Dietician

## 2011-05-26 NOTE — Telephone Encounter (Signed)
Called to assist with transition from hospital.

## 2011-06-01 ENCOUNTER — Encounter: Payer: Self-pay | Admitting: Internal Medicine

## 2011-06-01 ENCOUNTER — Ambulatory Visit (INDEPENDENT_AMBULATORY_CARE_PROVIDER_SITE_OTHER): Payer: BC Managed Care – PPO | Admitting: Internal Medicine

## 2011-06-01 VITALS — BP 115/75 | HR 80 | Temp 98.4°F | Ht 72.0 in | Wt 185.4 lb

## 2011-06-01 DIAGNOSIS — E119 Type 2 diabetes mellitus without complications: Secondary | ICD-10-CM

## 2011-06-01 DIAGNOSIS — I10 Essential (primary) hypertension: Secondary | ICD-10-CM

## 2011-06-01 DIAGNOSIS — M869 Osteomyelitis, unspecified: Secondary | ICD-10-CM

## 2011-06-01 MED ORDER — INSULIN GLARGINE 100 UNIT/ML ~~LOC~~ SOLN: 42.0000 [IU] | Freq: Every day | SUBCUTANEOUS | Status: DC

## 2011-06-01 MED ORDER — HYDROCODONE-ACETAMINOPHEN 5-500 MG PO TABS: 1.0000 | ORAL_TABLET | Freq: Every day | ORAL | Status: DC

## 2011-06-01 NOTE — Patient Instructions (Signed)
Please take 42 units of lantus. Please continue to take your sliding scale insulin as previously directed. Please take vicodin for pain control.  Please bring your short-term disability paperwork to the clinic.

## 2011-06-01 NOTE — Assessment & Plan Note (Signed)
Currently on 6 week therapy with augmentin. Still with swelling and pain of right foot. Gave patient notice to be off work while toe is healing, and vicodin for pain relief. Patient reports he has been taking tylenol, and ibuprofen three times daily, and continues to have throbbing pain. No fevers, chills, or evidence of systemic infection.

## 2011-06-01 NOTE — Assessment & Plan Note (Signed)
Lab Results  Component Value Date   HGBA1C >14.0 05/12/2011   HGBA1C  Value: 13.4 (NOTE)                                                                       According to the ADA Clinical Practice Recommendations for 2011, when HbA1c is used as a screening test:   >=6.5%   Diagnostic of Diabetes Mellitus           (if abnormal result  is confirmed)  5.7-6.4%   Increased risk of developing Diabetes Mellitus  References:Diagnosis and Classification of Diabetes Mellitus,Diabetes [id number] 1):S62-S69 and Standards of Medical Care in         Diabetes - 2011,Diabetes Care,2011,34  (Suppl 1):S11-S61.* 07/28/2009   CREATININE 0.92 05/24/2011   CREATININE 1.13 04/30/2010   MICROALBUR 61.98* 04/16/2010   MICRALBCREAT 407.8* 04/16/2010   CHOL 206* 05/12/2011   HDL 40 05/12/2011   TRIG 100 05/12/2011    Last eye exam and foot exam:    Component Value Date/Time   HMDIABFOOTEX done 07/28/2009    Assessment: Diabetes control: not controlled Progress toward goals: deteriorated Barriers to meeting goals: secondary to not being able to afford insulin  Plan: Diabetes treatment: continue current medications will increase lantus to 42 units Refer to: none Instruction/counseling given: reminded to bring blood glucose meter & log to each visit and reminded to bring medications to each visit

## 2011-06-01 NOTE — Progress Notes (Signed)
Subjective:     Patient ID: [name], male   DOB: 16-Jun-1960, 51 y.o.   MRN: [id number]  HPI Mr. Villa is a 51 year old gentleman with past medical history significant for type 2 diabetes, hypertension and hyperlipidemia who presents today for hospital followup. Patient was recently hospitalized in May for osteomyelitis of his right foot, fifth metatarsal joint. Patient was treated with IV antibiotics, followed by Augmentin for 6 weeks. Patient comes in today and reports that his foot is still painful. No new pus or drainage coming out of the wound site. Patient denies fevers or chills. He reports that when he keeps his leg elevated that it is not as swollen. Patient is off work and would like a letter of when he can return to work. He has been taking his insulin as directed. He takes 36 units at bedtime followed by 12 units by sliding scale before meals. Patient denies any hypoglycemic episodes and denies lightheadedness or dizziness.  Review of Systems  Constitutional: Negative for fever and chills.       Objective:   Physical Exam  Constitutional: He appears well-developed.  HENT:  Head: Normocephalic and atraumatic.  Eyes: Pupils are equal, round, and reactive to light.  Neck: Normal range of motion. Neck supple.  Cardiovascular: Normal rate and regular rhythm.   Pulmonary/Chest: Effort normal.  Abdominal: Soft.  Musculoskeletal:       Right 5th digit wrapped. Mild swelling noted.

## 2011-06-01 NOTE — Assessment & Plan Note (Signed)
Lab Results  Component Value Date   NA 132* 05/24/2011   K 4.4 05/24/2011   CL 94* 05/24/2011   CO2 26 05/24/2011   BUN 19 05/24/2011   CREATININE 0.92 05/24/2011   CREATININE 1.13 04/30/2010    BP Readings from Last 3 Encounters:  06/01/11 115/75  05/24/11 123/73  05/18/11 154/89    Assessment: Hypertension control:  controlled  Progress toward goals:  at goal Barriers to meeting goals:  no barriers identified  Plan: Hypertension treatment:  continue current medications

## 2011-06-02 ENCOUNTER — Telehealth: Payer: Self-pay | Admitting: Licensed Clinical Social Worker

## 2011-06-02 NOTE — Telephone Encounter (Signed)
Pt was referred to CSW due to financial difficulty with affording his medications.  CSW placed call to pt.  Pt states he is able to afford is medications and it is not an issue at this time.  CSW informed pt of MAP program which is available based on income.  Encouraged pt to contact CSW if pt should have financial difficulty affording his medications in the future.  Pt aware CSW is available to assist as needed.

## 2011-06-07 ENCOUNTER — Other Ambulatory Visit (HOSPITAL_COMMUNITY): Payer: Self-pay | Admitting: Orthopedic Surgery

## 2011-06-08 ENCOUNTER — Encounter (HOSPITAL_COMMUNITY)
Admission: RE | Admit: 2011-06-08 | Discharge: 2011-06-08 | Disposition: A | Discharge status: Home / Self Care | Payer: BC Managed Care – PPO | Source: Ambulatory Visit | Attending: Orthopedic Surgery | Admitting: Orthopedic Surgery

## 2011-06-08 ENCOUNTER — Encounter (HOSPITAL_COMMUNITY): Payer: Self-pay | Admitting: Pharmacy Technician

## 2011-06-08 ENCOUNTER — Encounter (HOSPITAL_COMMUNITY): Payer: Self-pay

## 2011-06-08 ENCOUNTER — Ambulatory Visit: Payer: BC Managed Care – PPO | Admitting: Dietician

## 2011-06-08 ENCOUNTER — Telehealth: Payer: Self-pay | Admitting: Dietician

## 2011-06-08 LAB — CBC
HCT: 36.7 % — ABNORMAL LOW (ref 39.0–52.0)
Hemoglobin: 12.4 g/dL — ABNORMAL LOW (ref 13.0–17.0)
MCH: 29.3 pg (ref 26.0–34.0)
RBC: 4.23 MIL/uL (ref 4.22–5.81)

## 2011-06-08 LAB — COMPREHENSIVE METABOLIC PANEL
ALT: 15 U/L (ref 0–53)
Alkaline Phosphatase: 78 U/L (ref 39–117)
BUN: 17 mg/dL (ref 6–23)
CO2: 28 mEq/L (ref 19–32)
GFR calc Af Amer: >90 mL/min (ref >90)
GFR calc non Af Amer: 85 mL/min — ABNORMAL LOW (ref >90)
Glucose, Bld: 164 mg/dL — ABNORMAL HIGH (ref 70–99)
Potassium: 3.6 mEq/L (ref 3.5–5.1)
Sodium: 137 mEq/L (ref 135–145)

## 2011-06-08 LAB — APTT: aPTT: 37 seconds (ref 24–37)

## 2011-06-08 LAB — SURGICAL PCR SCREEN: Staphylococcus aureus: NEGATIVE

## 2011-06-08 MED ORDER — CEFAZOLIN SODIUM-DEXTROSE 2-3 GM-% IV SOLR
2.0000 g | INTRAVENOUS | Status: DC
  Filled 2011-06-08: qty 50

## 2011-06-08 MED ORDER — CEFAZOLIN SODIUM 1-5 GM-% IV SOLN: 1.0000 g | INTRAVENOUS | Status: DC

## 2011-06-08 NOTE — Pre-Procedure Instructions (Signed)
[place]  06/08/2011   Your procedure is scheduled on:  Wednesday, June 5th.  Report to [facility] at 6:30AM.  Call this number if you have problems the morning of surgery: [phone]   Remember:   Do not eat food:After Midnight.  May have clear liquids: up to 4 Hours before arrival.  Clear liquids include soda, tea, black coffee, apple or grape juice, broth.    Take these medicines the morning of surgery with A SIP OF WATER: Amoxicillin-Clavuanate (Augementin).  Hydrocodone-acetaminophen (Vicodin) if needed.   Do not wear jewelry, make-up or nail polish.  Do not wear lotions, powders, or perfumes. You may wear deodorant.  Do not shave 48 hours prior to surgery. Men may shave face and neck.  Do not bring valuables to the hospital.  Contacts, dentures or bridgework may not be worn into surgery.  Leave suitcase in the car. After surgery it may be brought to your room.  For patients admitted to the hospital, checkout time is 11:00 AM the day of discharge.   Patients discharged the day of surgery will not be allowed to drive home.  Name and phone number of your driver: ______  Special Instructions: CHG Shower Use Special Wash: 1/2 bottle night before surgery and 1/2 bottle morning of surgery.   Please read over the following fact sheets that you were given: Pain Booklet, Coughing and Deep Breathing, MRSA Information and Surgical Site Infection Prevention

## 2011-06-08 NOTE — Telephone Encounter (Signed)
Reschedule appointment with CDE.

## 2011-06-09 ENCOUNTER — Encounter (HOSPITAL_COMMUNITY): Payer: Self-pay | Admitting: General Practice

## 2011-06-09 ENCOUNTER — Ambulatory Visit (HOSPITAL_COMMUNITY): Payer: BC Managed Care – PPO | Admitting: Critical Care Medicine

## 2011-06-09 ENCOUNTER — Encounter (HOSPITAL_COMMUNITY): Payer: Self-pay | Admitting: Critical Care Medicine

## 2011-06-09 ENCOUNTER — Encounter (HOSPITAL_COMMUNITY)
Admission: RE | Disposition: A | Discharge status: Home / Self Care | Payer: Self-pay | Source: Ambulatory Visit | Attending: Orthopedic Surgery

## 2011-06-09 DIAGNOSIS — E785 Hyperlipidemia, unspecified: Secondary | ICD-10-CM | POA: Diagnosis present

## 2011-06-09 DIAGNOSIS — E1149 Type 2 diabetes mellitus with other diabetic neurological complication: Secondary | ICD-10-CM | POA: Diagnosis present

## 2011-06-09 DIAGNOSIS — I10 Essential (primary) hypertension: Secondary | ICD-10-CM | POA: Diagnosis present

## 2011-06-09 DIAGNOSIS — Z8249 Family history of ischemic heart disease and other diseases of the circulatory system: Secondary | ICD-10-CM

## 2011-06-09 DIAGNOSIS — M86179 Other acute osteomyelitis, unspecified ankle and foot: Secondary | ICD-10-CM

## 2011-06-09 DIAGNOSIS — L02619 Cutaneous abscess of unspecified foot: Secondary | ICD-10-CM | POA: Diagnosis present

## 2011-06-09 DIAGNOSIS — E119 Type 2 diabetes mellitus without complications: Secondary | ICD-10-CM

## 2011-06-09 DIAGNOSIS — M9080 Osteopathy in diseases classified elsewhere, unspecified site: Secondary | ICD-10-CM | POA: Diagnosis present

## 2011-06-09 DIAGNOSIS — E1142 Type 2 diabetes mellitus with diabetic polyneuropathy: Secondary | ICD-10-CM | POA: Diagnosis present

## 2011-06-09 DIAGNOSIS — Z833 Family history of diabetes mellitus: Secondary | ICD-10-CM

## 2011-06-09 DIAGNOSIS — L03039 Cellulitis of unspecified toe: Secondary | ICD-10-CM | POA: Diagnosis present

## 2011-06-09 DIAGNOSIS — E1169 Type 2 diabetes mellitus with other specified complication: Principal | ICD-10-CM | POA: Diagnosis present

## 2011-06-09 DIAGNOSIS — Z87891 Personal history of nicotine dependence: Secondary | ICD-10-CM

## 2011-06-09 DIAGNOSIS — M86679 Other chronic osteomyelitis, unspecified ankle and foot: Secondary | ICD-10-CM | POA: Diagnosis present

## 2011-06-09 DIAGNOSIS — L97509 Non-pressure chronic ulcer of other part of unspecified foot with unspecified severity: Secondary | ICD-10-CM | POA: Diagnosis present

## 2011-06-09 HISTORY — PX: AMPUTATION: SHX166

## 2011-06-09 HISTORY — DX: Myoneural disorder, unspecified: G70.9

## 2011-06-09 LAB — GLUCOSE, CAPILLARY: Glucose-Capillary: 154 mg/dL — ABNORMAL HIGH (ref 70–99)

## 2011-06-09 SURGERY — AMPUTATION, FOOT, RAY
Anesthesia: Monitor Anesthesia Care | Site: Foot | Laterality: Right | Wound class: Dirty or Infected

## 2011-06-09 MED ORDER — INSULIN GLARGINE 100 UNIT/ML ~~LOC~~ SOLN: 42.0000 [IU] | Freq: Every day | SUBCUTANEOUS | Status: DC

## 2011-06-09 MED ORDER — SIMVASTATIN 5 MG PO TABS
5.0000 mg | ORAL_TABLET | Freq: Every day | ORAL | Status: DC
  Administered 2011-06-09 – 2011-06-10 (×2): 5 mg via ORAL
  Filled 2011-06-09 (×3): qty 1

## 2011-06-09 MED ORDER — ASPIRIN EC 81 MG PO TBEC
81.0000 mg | DELAYED_RELEASE_TABLET | Freq: Every day | ORAL | Status: DC
  Administered 2011-06-09 – 2011-06-11 (×3): 81 mg via ORAL
  Filled 2011-06-09 (×3): qty 1

## 2011-06-09 MED ORDER — FENTANYL CITRATE 0.05 MG/ML IJ SOLN: 25.0000 ug | INTRAMUSCULAR | Status: DC | PRN

## 2011-06-09 MED ORDER — ONDANSETRON HCL 4 MG PO TABS: 4.0000 mg | ORAL_TABLET | Freq: Four times a day (QID) | ORAL | Status: DC | PRN

## 2011-06-09 MED ORDER — HYDROMORPHONE HCL PF 1 MG/ML IJ SOLN: 0.5000 mg | INTRAMUSCULAR | Status: DC | PRN

## 2011-06-09 MED ORDER — OXYCODONE-ACETAMINOPHEN 5-325 MG PO TABS
1.0000 | ORAL_TABLET | ORAL | Status: DC | PRN
  Administered 2011-06-11: 2 via ORAL
  Filled 2011-06-09: qty 2

## 2011-06-09 MED ORDER — LISINOPRIL 20 MG PO TABS
20.0000 mg | ORAL_TABLET | Freq: Every day | ORAL | Status: DC
  Administered 2011-06-09 – 2011-06-11 (×3): 20 mg via ORAL
  Filled 2011-06-09 (×3): qty 1

## 2011-06-09 MED ORDER — ONDANSETRON HCL 4 MG/2ML IJ SOLN: 4.0000 mg | Freq: Four times a day (QID) | INTRAMUSCULAR | Status: DC | PRN

## 2011-06-09 MED ORDER — INSULIN GLARGINE 100 UNIT/ML ~~LOC~~ SOLN
42.0000 [IU] | Freq: Every day | SUBCUTANEOUS | Status: DC
  Administered 2011-06-09: 42 [IU] via SUBCUTANEOUS
  Administered 2011-06-10: 21 [IU] via SUBCUTANEOUS

## 2011-06-09 MED ORDER — HYDROCODONE-ACETAMINOPHEN 5-325 MG PO TABS
1.0000 | ORAL_TABLET | ORAL | Status: DC | PRN
  Administered 2011-06-09 – 2011-06-10 (×2): 2 via ORAL
  Filled 2011-06-09 (×2): qty 2

## 2011-06-09 MED ORDER — BUPIVACAINE-EPINEPHRINE PF 0.5-1:200000 % IJ SOLN
INTRAMUSCULAR | Status: DC | PRN
  Administered 2011-06-09: 30 mL

## 2011-06-09 MED ORDER — SODIUM CHLORIDE 0.9 % IV SOLN
INTRAVENOUS | Status: DC
  Administered 2011-06-09: 20 mL/h via INTRAVENOUS

## 2011-06-09 MED ORDER — LACTATED RINGERS IV SOLN
INTRAVENOUS | Status: DC | PRN
  Administered 2011-06-09: 08:00:00 via INTRAVENOUS

## 2011-06-09 MED ORDER — PROPOFOL 10 MG/ML IV EMUL
INTRAVENOUS | Status: DC | PRN
  Administered 2011-06-09: 10 mg via INTRAVENOUS

## 2011-06-09 MED ORDER — METFORMIN HCL 500 MG PO TABS
1000.0000 mg | ORAL_TABLET | Freq: Two times a day (BID) | ORAL | Status: DC
  Administered 2011-06-09 – 2011-06-11 (×4): 1000 mg via ORAL
  Filled 2011-06-09 (×6): qty 2

## 2011-06-09 MED ORDER — ONDANSETRON HCL 4 MG/2ML IJ SOLN
INTRAMUSCULAR | Status: DC | PRN
  Administered 2011-06-09: 4 mg via INTRAVENOUS

## 2011-06-09 MED ORDER — INSULIN ASPART 100 UNIT/ML ~~LOC~~ SOLN
4.0000 [IU] | Freq: Three times a day (TID) | SUBCUTANEOUS | Status: DC
  Administered 2011-06-09 – 2011-06-11 (×6): 4 [IU] via SUBCUTANEOUS

## 2011-06-09 MED ORDER — PROPOFOL 10 MG/ML IV EMUL
INTRAVENOUS | Status: DC | PRN
  Administered 2011-06-09: 10 ug/kg/min via INTRAVENOUS

## 2011-06-09 MED ORDER — METOCLOPRAMIDE HCL 10 MG PO TABS: 5.0000 mg | ORAL_TABLET | Freq: Three times a day (TID) | ORAL | Status: DC | PRN

## 2011-06-09 MED ORDER — MIDAZOLAM HCL 5 MG/5ML IJ SOLN
INTRAMUSCULAR | Status: DC | PRN
  Administered 2011-06-09 (×2): 1 mg via INTRAVENOUS

## 2011-06-09 MED ORDER — 0.9 % SODIUM CHLORIDE (POUR BTL) OPTIME
TOPICAL | Status: DC | PRN
  Administered 2011-06-09: 1000 mL

## 2011-06-09 MED ORDER — METOCLOPRAMIDE HCL 5 MG/ML IJ SOLN: 5.0000 mg | Freq: Three times a day (TID) | INTRAMUSCULAR | Status: DC | PRN

## 2011-06-09 MED ORDER — FENTANYL CITRATE 0.05 MG/ML IJ SOLN
INTRAMUSCULAR | Status: DC | PRN
  Administered 2011-06-09 (×2): 25 ug via INTRAVENOUS
  Administered 2011-06-09 (×2): 50 ug via INTRAVENOUS

## 2011-06-09 MED ORDER — INSULIN ASPART 100 UNIT/ML ~~LOC~~ SOLN
0.0000 [IU] | Freq: Three times a day (TID) | SUBCUTANEOUS | Status: DC
  Administered 2011-06-09 (×2): 3 [IU] via SUBCUTANEOUS
  Administered 2011-06-10 (×2): 5 [IU] via SUBCUTANEOUS
  Administered 2011-06-10 – 2011-06-11 (×2): 2 [IU] via SUBCUTANEOUS

## 2011-06-09 MED ORDER — LISINOPRIL-HYDROCHLOROTHIAZIDE 20-25 MG PO TABS: 1.0000 | ORAL_TABLET | Freq: Every day | ORAL | Status: DC

## 2011-06-09 MED ORDER — CEFAZOLIN SODIUM 1-5 GM-% IV SOLN
1.0000 g | Freq: Four times a day (QID) | INTRAVENOUS | Status: AC
  Administered 2011-06-09 (×3): 1 g via INTRAVENOUS
  Filled 2011-06-09 (×3): qty 50

## 2011-06-09 MED ORDER — HYDROCHLOROTHIAZIDE 25 MG PO TABS
25.0000 mg | ORAL_TABLET | Freq: Every day | ORAL | Status: DC
  Administered 2011-06-09 – 2011-06-11 (×3): 25 mg via ORAL
  Filled 2011-06-09 (×3): qty 1

## 2011-06-09 SURGICAL SUPPLY — 44 items
BANDAGE ESMARK 6X9 LF (GAUZE/BANDAGES/DRESSINGS) ×1 IMPLANT
BANDAGE GAUZE ELAST BULKY 4 IN (GAUZE/BANDAGES/DRESSINGS) ×2 IMPLANT
BLADE SAW SGTL MED 73X18.5 STR (BLADE) IMPLANT
BNDG COHESIVE 4X5 TAN STRL (GAUZE/BANDAGES/DRESSINGS) ×2 IMPLANT
BNDG COHESIVE 6X5 TAN STRL LF (GAUZE/BANDAGES/DRESSINGS) ×2 IMPLANT
BNDG ESMARK 6X9 LF (GAUZE/BANDAGES/DRESSINGS) ×2
CLOTH BEACON ORANGE TIMEOUT ST (SAFETY) ×2 IMPLANT
CONT SPEC 4OZ CLIKSEAL STRL BL (MISCELLANEOUS) ×2 IMPLANT
CUFF TOURNIQUET SINGLE 34IN LL (TOURNIQUET CUFF) IMPLANT
CUFF TOURNIQUET SINGLE 44IN (TOURNIQUET CUFF) IMPLANT
DRAPE U-SHAPE 47X51 STRL (DRAPES) ×2 IMPLANT
DRSG ADAPTIC 3X8 NADH LF (GAUZE/BANDAGES/DRESSINGS) ×2 IMPLANT
DURAPREP 26ML APPLICATOR (WOUND CARE) ×2 IMPLANT
ELECT REM PT RETURN 9FT ADLT (ELECTROSURGICAL) ×2
ELECTRODE REM PT RTRN 9FT ADLT (ELECTROSURGICAL) ×1 IMPLANT
GLOVE BIOGEL PI IND STRL 6.5 (GLOVE) ×1 IMPLANT
GLOVE BIOGEL PI IND STRL 9 (GLOVE) ×1 IMPLANT
GLOVE BIOGEL PI INDICATOR 6.5 (GLOVE) ×1
GLOVE BIOGEL PI INDICATOR 9 (GLOVE) ×1
GLOVE SS BIOGEL STRL SZ 6.5 (GLOVE) ×1 IMPLANT
GLOVE SUPERSENSE BIOGEL SZ 6.5 (GLOVE) ×1
GLOVE SURG ORTHO 9.0 STRL STRW (GLOVE) ×2 IMPLANT
GOWN PREVENTION PLUS XLARGE (GOWN DISPOSABLE) ×2 IMPLANT
GOWN SRG XL XLNG 56XLVL 4 (GOWN DISPOSABLE) ×1 IMPLANT
GOWN STRL NON-REIN XL XLG LVL4 (GOWN DISPOSABLE) ×1
KIT BASIN OR (CUSTOM PROCEDURE TRAY) ×2 IMPLANT
KIT ROOM TURNOVER OR (KITS) ×2 IMPLANT
MANIFOLD NEPTUNE II (INSTRUMENTS) IMPLANT
NS IRRIG 1000ML POUR BTL (IV SOLUTION) ×2 IMPLANT
PACK ORTHO EXTREMITY (CUSTOM PROCEDURE TRAY) ×2 IMPLANT
PAD ARMBOARD 7.5X6 YLW CONV (MISCELLANEOUS) ×2 IMPLANT
PAD CAST 4YDX4 CTTN HI CHSV (CAST SUPPLIES) ×1 IMPLANT
PADDING CAST COTTON 4X4 STRL (CAST SUPPLIES) ×1
SPONGE GAUZE 4X4 12PLY (GAUZE/BANDAGES/DRESSINGS) ×2 IMPLANT
SPONGE LAP 18X18 X RAY DECT (DISPOSABLE) ×2 IMPLANT
STAPLER VISISTAT 35W (STAPLE) ×2 IMPLANT
STOCKINETTE IMPERVIOUS LG (DRAPES) IMPLANT
SUCTION FRAZIER TIP 10 FR DISP (SUCTIONS) IMPLANT
SUT ETHILON 2 0 PSLX (SUTURE) ×2 IMPLANT
TOWEL OR 17X24 6PK STRL BLUE (TOWEL DISPOSABLE) ×2 IMPLANT
TOWEL OR 17X26 10 PK STRL BLUE (TOWEL DISPOSABLE) ×2 IMPLANT
TUBE CONNECTING 12X1/4 (SUCTIONS) IMPLANT
UNDERPAD 30X30 INCONTINENT (UNDERPADS AND DIAPERS) IMPLANT
WATER STERILE IRR 1000ML POUR (IV SOLUTION) IMPLANT

## 2011-06-09 NOTE — Progress Notes (Signed)
Orthopedic Tech Progress Note Patient Details:  [name] [birth year] [id number]  Ortho Devices Type of Ortho Device: Postop boot Ortho Device/Splint Interventions: Application   Cammer, Theodoro [place] 06/09/2011, 10:23 AM
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
## 2011-06-09 NOTE — Anesthesia Postprocedure Evaluation (Addendum)
Anesthesia Post Note  Patient: [name]  Procedure(s) Performed: Procedure(s) (LRB): AMPUTATION RAY (Right)  Anesthesia type: MAC and ankle block  Patient location: PACU  Post pain: Pain level controlled and Adequate analgesia  Post assessment: Post-op Vital signs reviewed, Patient's Cardiovascular Status Stable, Respiratory Function Stable, Patent Airway and Pain level controlled  Last Vitals:  Filed Vitals:   06/09/11 0845  BP: 142/80  Pulse: 99  Temp: 36 C  Resp: 20    Post vital signs: Reviewed and stable  Level of consciousness: awake, alert  and oriented  Complications: No apparent anesthesia complications

## 2011-06-09 NOTE — Op Note (Signed)
OPERATIVE REPORT  DATE OF SURGERY: 06/09/2011  PATIENT:  [name],  51 y.o. male  PRE-OPERATIVE DIAGNOSIS:  Osteomyelitis Right 5th Toe  POST-OPERATIVE DIAGNOSIS:  Osteomyelitis Right 5th Toe  PROCEDURE:  Procedure(s): AMPUTATION RAY right foot fifth ray  SURGEON:  Surgeon(s): [name], MD  ANESTHESIA:   regional  EBL:  Minimal ML  SPECIMEN:  Source of Specimen:  Fifth ray right foot  TOURNIQUET:  * No tourniquets in log *  PROCEDURE DETAILS: Patient is a 51 year old gentleman with osteomyelitis abscess ulceration little toe right foot he has failed conservative treatment and presents at this time for fifth ray amputation. Risks and benefits were discussed including infection neurovascular injury nonhealing of the wound need for additional surgery. Patient states he understands was pursued this time. Description of procedure patient brought to or room tendon after undergoing an ankle block. After adequate levels and anesthesia obtained patient's right lower extremity was prepped using DuraPrep and draped into a sterile field. A racquet incision was made around the toe the fifth metatarsal was resected through the base leaving the attachment of the peroneal tendon intact. The wound was irrigated with normal saline hemostasis was obtained there was good petechial bleeding and no abscess no purulence no necrotic tissue. The wound was closed using 2-0 nylon the wound was covered with Adaptic orthopedic sponges Kerlix and Coban. Patient was extubated taken the PACU in stable condition.  PLAN OF CARE: Admit to inpatient   PATIENT DISPOSITION:  PACU - hemodynamically stable.   [name], MD 06/09/2011 8:53 AM

## 2011-06-09 NOTE — Anesthesia Procedure Notes (Addendum)
Anesthesia Regional Block:  Ankle block  Pre-Anesthetic Checklist: ,, timeout performed, Correct Patient, Correct Site, Correct Laterality, Correct Procedure,, site marked, risks and benefits discussed, Surgical consent, Pre-op evaluation,  At surgeon's request  Laterality: Right  Prep: chloraprep       Needles:  Injection technique: Single-shot      Needle Gauge: 25 and 25 G    Additional Needles: Ankle block Narrative:  Start time: 06/09/2011 8:10 AM End time: 06/09/2011 8:18 AM  Performed by: Personally  Anesthesiologist: Dr Marcie Bal  Additional Notes: A functioning IV was confirmed and monitors were applied.  Sterile prep and drape, hand hygiene and sterile gloves were used.  Negative aspiration and test dose prior to incremental administration of local anesthetic using the 25 ga needle. 5 ports used.  The patient tolerated the procedure well.    Procedure Name: MAC Date/Time: 06/09/2011 8:30 AM Performed by: Carola Frost Pre-anesthesia Checklist: Patient identified, Emergency Drugs available, Suction available, Patient being monitored and Timeout performed Oxygen Delivery Method: Simple face mask Placement Confirmation: positive ETCO2 and breath sounds checked- equal and bilateral Dental Injury: Teeth and Oropharynx as per pre-operative assessment

## 2011-06-09 NOTE — Progress Notes (Signed)
Report given to maria rn as caregiver 

## 2011-06-09 NOTE — Preoperative (Signed)
Beta Blockers   Reason not to administer Beta Blockers:Not Applicable 

## 2011-06-09 NOTE — Anesthesia Preprocedure Evaluation (Addendum)
Anesthesia Evaluation  Patient identified by MRN, date of birth, ID band Patient awake    Reviewed: Allergy & Precautions, H&P , NPO status , Patient's Chart, lab work & pertinent test results  Airway Mallampati: II TM Distance: >3 FB Neck ROM: full    Dental  (+) Dental Advisory Given   Pulmonary former smoker         Cardiovascular hypertension, Pt. on medications     Neuro/Psych    GI/Hepatic   Endo/Other  Diabetes mellitus-, Type 2, Insulin Dependent and Oral Hypoglycemic Agents  Renal/GU      Musculoskeletal   Abdominal   Peds  Hematology   Anesthesia Other Findings   Reproductive/Obstetrics                         Anesthesia Physical Anesthesia Plan  ASA: III  Anesthesia Plan: MAC and Regional   Post-op Pain Management:    Induction: Intravenous  Airway Management Planned: LMA  Additional Equipment:   Intra-op Plan:   Post-operative Plan: Extubation in OR  Informed Consent: I have reviewed the patients History and Physical, chart, labs and discussed the procedure including the risks, benefits and alternatives for the proposed anesthesia with the patient or authorized representative who has indicated his/her understanding and acceptance.   Dental advisory given  Plan Discussed with: CRNA, Anesthesiologist and Surgeon  Anesthesia Plan Comments:        Anesthesia Quick Evaluation

## 2011-06-09 NOTE — Transfer of Care (Signed)
Immediate Anesthesia Transfer of Care Note  Patient: [name]  Procedure(s) Performed: Procedure(s) (LRB): AMPUTATION RAY (Right)  Patient Location: PACU  Anesthesia Type: MAC and Regional  Level of Consciousness: awake, alert  and oriented  Airway & Oxygen Therapy: Patient Spontanous Breathing  Post-op Assessment: Report given to PACU RN, Post -op Vital signs reviewed and stable and Patient moving all extremities X 4  Post vital signs: Reviewed and stable  Complications: No apparent anesthesia complications

## 2011-06-10 LAB — GLUCOSE, CAPILLARY: Glucose-Capillary: 208 mg/dL — ABNORMAL HIGH (ref 70–99)

## 2011-06-10 NOTE — Progress Notes (Signed)
Patient ID: [name], male   DOB: January 28, 1960, 51 y.o.   MRN: [id number] Postoperative day 1 right foot fifth ray amputation. Physical therapy minimize weightbearing on the right lower extremity. Plan for discharge to home on Friday.
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
## 2011-06-10 NOTE — Progress Notes (Signed)
Orthopedic Tech Progress Note Patient Details:  [name] 1960-09-21 [id number] Crutches ordered by PT Patient ID: [name], male   DOB: January 23, 1960, 51 y.o.   MRN: [id number]   Fenton Foy 06/10/2011, 12:18 PM

## 2011-06-10 NOTE — Progress Notes (Signed)
Inpatient Diabetes Program Recommendations  AACE/ADA: New Consensus Statement on Inpatient Glycemic Control (2009)  Target Ranges:  Prepandial:   less than 140 mg/dL      Peak postprandial:   less than 180 mg/dL (1-2 hours)      Critically ill patients:  140 - 180 mg/dL   Reason for Visit: Hyperglycemia  Results for JACOBO, CEPERO (MRN [id number]) as of 06/10/2011 13:50  Ref. Range 05/12/2011 09:55  Hemoglobin A1C Latest Range: <5.7 % >14.0  Results for PLUMMER, LAPRADE (MRN [id number]) as of 06/10/2011 13:50  Ref. Range 06/09/2011 21:30 06/10/2011 05:50 06/10/2011 11:47  Glucose-Capillary Latest Range: 70-99 mg/dL 162 (H) 205 (H) 208 (H)    Inpatient Diabetes Program Recommendations Insulin - Meal Coverage: Increase meal coverage insulin to Novolog 8 units tidwc. (Pt is on 12 units tidwc at home)  Note: Sees Debera Lat, RD, CDE [facility].  Will follow while in hospital.

## 2011-06-11 ENCOUNTER — Encounter (HOSPITAL_COMMUNITY): Payer: Self-pay | Admitting: Orthopedic Surgery

## 2011-06-11 LAB — GLUCOSE, CAPILLARY
Glucose-Capillary: 136 mg/dL — ABNORMAL HIGH (ref 70–99)
Glucose-Capillary: 181 mg/dL — ABNORMAL HIGH (ref 70–99)

## 2011-06-11 MED ORDER — OXYCODONE-ACETAMINOPHEN 5-325 MG PO TABS
1.0000 | ORAL_TABLET | ORAL | Status: AC | PRN
Start: 2011-06-11 — End: 2011-06-21

## 2011-06-11 NOTE — Discharge Instructions (Signed)
Keep incision clean and dry right foot minimize weightbearing right lower extremity. Keep leg elevated at all times.

## 2011-06-11 NOTE — Telephone Encounter (Signed)
He was discharged today from hospital from foot surgery by Dr. Sharol Given. He reports his CBGs 3 x/day prior to surgery usually less than 130 mg/dl, He cannot schedule appointment until ambulation okayed by Dr. Sharol Given. Encouraged him to call Dr. Sharol Given or our office if CBGs staying >200mg /dl and he doesn't know why.

## 2011-06-11 NOTE — Progress Notes (Signed)
CARE MANAGEMENT NOTE 06/11/2011  Patient:  [name],[name]   Account Number:  [id number]  Date Initiated:  06/11/2011  Documentation initiated by:  Ricki Miller  Subjective/Objective Assessment:   51 yr old male s/p right 5th ray amputation.     Action/Plan:   No HH needs identified. Patient needs crutches. ordered thru [employer].   Anticipated DC Date:  06/11/2011   Anticipated DC Plan:  HOME/SELF CARE      DC Planning Services  CM consult      PAC Choice  DURABLE MEDICAL EQUIPMENT   Choice offered to / List presented to:     DME arranged  CRUTCHES           Status of service:  Completed, signed off Medicare Important Message given?   (If response is "NO", the following Medicare IM given date fields will be blank) Date Medicare IM given:   Date Additional Medicare IM given:    Discharge Disposition:  HOME/SELF CARE  Per UR Regulation:    If discussed at Long Length of Stay Meetings, dates discussed:    Comments:

## 2011-06-11 NOTE — Progress Notes (Signed)
PT Progress Note:     06/11/11 1400  PT Visit Information  Last PT Received On 06/11/11  PT Time Calculation  PT Start Time 0749  PT Stop Time 0800  PT Time Calculation (min) 11 min  Restrictions  Weight Bearing Restrictions Yes  RLE Weight Bearing TWB  Cognition  Overall Cognitive Status Appears within functional limits for tasks assessed/performed  Arousal/Alertness Awake/alert  Orientation Level Oriented X4 / Intact  Behavior During Session [facility] for tasks performed  Bed Mobility  Bed Mobility Supine to Sit  Supine to Sit 7: Independent  Transfers  Transfers Sit to Stand;Stand to Sit  Sit to Stand 5: Supervision;From bed;With upper extremity assist  Stand to Sit 5: Supervision;With upper extremity assist;With armrests;To chair/3-in-1  Details for Transfer Assistance Pt demonstrated safe crutch management, but required cues to reinforce LE positioning to maintain TWB restriction  Ambulation/Gait  Ambulation/Gait Assistance 5: Supervision  Ambulation Distance (Feet) 100 Feet  Assistive device Crutches  Ambulation/Gait Assistance Details Cues for safe crutch management.  Pt did well with adhering to TWB  Gait Pattern Step-to pattern  Stairs No  Wheelchair Mobility  Wheelchair Mobility No  Balance  Balance Assessed No  PT - End of Session  Equipment Utilized During Treatment Gait belt  Activity Tolerance Patient tolerated treatment well  Patient left in chair;with call bell/phone within reach  PT - Assessment/Plan  Comments on Treatment Session Pt progressing well with mobility.  Utilized crutches for ambulation.  Improvement with adhering to [facility] restriction.    PT Plan Discharge plan remains appropriate  PT Frequency Min 5X/week  Follow Up Recommendations No PT follow up;Supervision - Intermittent  Equipment Recommended (crutches have been delivered to pt's room)  Acute Rehab PT Goals  Time For Goal Achievement 06/17/11  Potential to Achieve Goals Good  PT Goal: Sit  to Stand - Progress Progressing toward goal  PT Goal: Stand to Sit - Progress Progressing toward goal  PT Goal: Ambulate - Progress Progressing toward goal     Sarajane Marek, PTA [phone] 06/11/2011

## 2011-06-11 NOTE — Discharge Summary (Signed)
Physician Discharge Summary  Patient ID: [name] MRN: [id number] DOB/AGE: December 22, 1960 51 y.o.  Admit date: 06/09/2011 Discharge date: 06/11/2011  Admission Diagnoses: Osteomyelitis abscess right foot fifth toe.  Discharge Diagnoses: Same Active Problems:  * No active hospital problems. *    Discharged Condition: stable  Hospital Course: Patient's hospital course was essentially unremarkable. He underwent right foot fifth ray amputation postoperatively he progressed well and was discharged to home in stable condition.  Consults: None  Significant Diagnostic Studies: labs: Routine labs  Treatments: surgery: Please see operative note  Discharge Exam: Blood pressure 134/76, pulse 91, temperature 98.2 F (36.8 C), temperature source Oral, resp. rate 18, SpO2 100.00%. Incision/Wound: incision clean and dry at time of discharge.  Disposition: 01-Home or Self Care  Discharge Orders    Future Appointments: Provider: Department: Dept Phone: Center:   06/24/2011 8:00 AM [facility] [id number] [facility]     Medication List  As of 06/11/2011  6:55 AM   ASK your doctor about these medications         amoxicillin-clavulanate 875-125 MG per tablet   Commonly known as: AUGMENTIN   Take 1 tablet by mouth 2 (two) times daily.      aspirin EC 81 MG tablet   Take 81 mg by mouth daily.      HYDROcodone-acetaminophen 5-500 MG per tablet   Commonly known as: VICODIN   Take 1 tablet by mouth at bedtime.      insulin aspart 100 UNIT/ML injection   Commonly known as: novoLOG   Inject 12 Units into the skin 3 (three) times daily before meals.      insulin glargine 100 UNIT/ML injection   Commonly known as: LANTUS   Inject 42 Units into the skin at bedtime.      lisinopril-hydrochlorothiazide 20-25 MG per tablet   Commonly known as: PRINZIDE,ZESTORETIC   Take 1 tablet by mouth daily.      metFORMIN 1000 MG tablet   Commonly known as: GLUCOPHAGE   Take 1,000 mg  by mouth 2 (two) times daily with a meal.      pravastatin 40 MG tablet   Commonly known as: PRAVACHOL   Take 40 mg by mouth daily.           Follow-up Information    Follow up with Jamicia Haaland V, MD in 1 week.   Contact information:   [place] [phone]          Signed: [name] 06/11/2011, 6:55 AM

## 2011-06-24 ENCOUNTER — Encounter (HOSPITAL_BASED_OUTPATIENT_CLINIC_OR_DEPARTMENT_OTHER): Payer: BC Managed Care – PPO | Attending: Internal Medicine

## 2011-07-01 ENCOUNTER — Ambulatory Visit (INDEPENDENT_AMBULATORY_CARE_PROVIDER_SITE_OTHER): Payer: BC Managed Care – PPO | Admitting: Internal Medicine

## 2011-07-01 ENCOUNTER — Encounter: Payer: Self-pay | Admitting: Internal Medicine

## 2011-07-01 VITALS — BP 162/85 | HR 105 | Temp 96.7°F | Ht 72.0 in | Wt 185.7 lb

## 2011-07-01 DIAGNOSIS — E119 Type 2 diabetes mellitus without complications: Secondary | ICD-10-CM

## 2011-07-01 LAB — GLUCOSE, CAPILLARY: Glucose-Capillary: 321 mg/dL — ABNORMAL HIGH (ref 70–99)

## 2011-07-01 MED ORDER — INSULIN GLARGINE 100 UNIT/ML ~~LOC~~ SOLN: SUBCUTANEOUS | Status: DC

## 2011-07-01 NOTE — Patient Instructions (Signed)
We changed your lantus to 44 units at night. Please bring your meter at all office visits. Follow up in 1 month.

## 2011-07-04 NOTE — Assessment & Plan Note (Addendum)
Increase lantus by 2 units and follow up in 1 month. Patient counseled extensively about importance of taking CBG's and bring it to office visits for more informed changes in insulin dosages. Foot exam done today.

## 2011-07-04 NOTE — Progress Notes (Signed)
  Subjective:    Patient ID: [name], male    DOB: [birth year], 51 y.o.   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 All his CBG's >200 at all times. He did not bring his meter today but he says that he feels sweating and shaky every once in a while and thinks that they are related to hypoglycemia although he has never checked his CBG's during these episodes.    Review of Systems  Constitutional: Negative for fever, activity change and appetite change.  HENT: Negative for sore throat.   Respiratory: Negative for cough and shortness of breath.   Cardiovascular: Negative for chest pain and leg swelling.  Gastrointestinal: Negative for nausea, abdominal pain, diarrhea, constipation and abdominal distention.  Genitourinary: Negative for frequency, hematuria and difficulty urinating.  Neurological: Negative for dizziness and headaches.  Psychiatric/Behavioral: Negative for suicidal ideas and behavioral problems.       Objective:   Physical Exam  Constitutional: He is oriented to person, place, and time. He appears well-developed and well-nourished.  HENT:  Head: Normocephalic and atraumatic.  Eyes: Conjunctivae and EOM are normal. Pupils are equal, round, and reactive to light. No scleral icterus.  Neck: Normal range of motion. Neck supple. No JVD present. No thyromegaly present.  Cardiovascular: Normal rate, regular rhythm, normal heart sounds and intact distal pulses.  Exam reveals no gallop and no friction rub.   No murmur heard. Pulmonary/Chest: Effort normal and breath sounds normal. No respiratory distress. He has no wheezes. He has no rales.  Abdominal: Soft. Bowel sounds are normal. He exhibits no distension and no mass. There is no tenderness. There is no rebound and no guarding.  Musculoskeletal: Normal range of motion. He exhibits no edema and no tenderness.       Right foot wrapped in bandages    Lymphadenopathy:    He has no cervical adenopathy.  Neurological: He is alert and oriented to person, place, and time.  Psychiatric: He has a normal mood and affect. His behavior is normal.          Assessment & Plan:

## 2011-09-28 ENCOUNTER — Other Ambulatory Visit: Payer: Self-pay | Admitting: *Deleted

## 2011-09-28 NOTE — Telephone Encounter (Signed)
Pt also asking for refill on viagra.  It's in historical med list.

## 2011-09-29 MED ORDER — METFORMIN HCL 1000 MG PO TABS: 1000.0000 mg | ORAL_TABLET | Freq: Two times a day (BID) | ORAL | Status: DC

## 2011-10-05 ENCOUNTER — Encounter: Payer: Self-pay | Admitting: Internal Medicine

## 2011-10-05 ENCOUNTER — Ambulatory Visit (INDEPENDENT_AMBULATORY_CARE_PROVIDER_SITE_OTHER): Payer: BC Managed Care – PPO | Admitting: Internal Medicine

## 2011-10-05 VITALS — BP 170/89 | HR 65 | Temp 98.0°F | Ht 72.0 in | Wt 193.5 lb

## 2011-10-05 DIAGNOSIS — L97509 Non-pressure chronic ulcer of other part of unspecified foot with unspecified severity: Secondary | ICD-10-CM

## 2011-10-05 DIAGNOSIS — Z79899 Other long term (current) drug therapy: Secondary | ICD-10-CM

## 2011-10-05 DIAGNOSIS — E11621 Type 2 diabetes mellitus with foot ulcer: Secondary | ICD-10-CM

## 2011-10-05 DIAGNOSIS — R059 Cough, unspecified: Secondary | ICD-10-CM

## 2011-10-05 DIAGNOSIS — E1169 Type 2 diabetes mellitus with other specified complication: Secondary | ICD-10-CM

## 2011-10-05 DIAGNOSIS — R05 Cough: Secondary | ICD-10-CM

## 2011-10-05 DIAGNOSIS — E119 Type 2 diabetes mellitus without complications: Secondary | ICD-10-CM

## 2011-10-05 MED ORDER — GABAPENTIN 300 MG PO CAPS: 300.0000 mg | ORAL_CAPSULE | Freq: Three times a day (TID) | ORAL | Status: DC

## 2011-10-05 MED ORDER — INSULIN ASPART 100 UNIT/ML ~~LOC~~ SOLN: 17.0000 [IU] | Freq: Three times a day (TID) | SUBCUTANEOUS | Status: DC

## 2011-10-05 MED ORDER — HYDROCODONE-ACETAMINOPHEN 5-500 MG PO TABS: 1.0000 | ORAL_TABLET | Freq: Four times a day (QID) | ORAL | Status: DC | PRN

## 2011-10-05 MED ORDER — INSULIN GLARGINE 100 UNIT/ML ~~LOC~~ SOLN: SUBCUTANEOUS | Status: DC

## 2011-10-05 MED ORDER — CEPHALEXIN 500 MG PO CAPS: 500.0000 mg | ORAL_CAPSULE | Freq: Two times a day (BID) | ORAL | Status: AC

## 2011-10-05 MED ORDER — GUAIFENESIN ER 600 MG PO TB12: 600.0000 mg | ORAL_TABLET | Freq: Two times a day (BID) | ORAL | Status: DC

## 2011-10-05 NOTE — Assessment & Plan Note (Signed)
The patient presents with a Wagner grade 1 (full thickness skin lesion) ulcer of his left foot, involving the skin around all 4 remaining toes on left foot.  There is no evidence of cellulitis, or erosion into deeper tissue structures.  The patient has a history of osteomyelitis s/p multiple amputations, but given the very superficial appearance of this lesion and reportedly small time frame, my suspicion for osteomyelitis is very low at this point. -wound dressed with vasoline gauze by nurse -referral to wound care center -patient instructed to change bandage daily -keflex 500 BID x10 days -gabapentin for pain -hydrocodone if needed for pain if no relief with gabapentin (short-term supply only)

## 2011-10-05 NOTE — Patient Instructions (Signed)
For your foot wound, we are sending you to the Wound Care center.  They will contact you with an appointment. -change your wound bandage daily -to prevent infection, we are prescribing Keflex, take 1 tablet twice per day for 10 days -for your pain, we are prescribing gabapentin.  Take 1 tablet three times per day. -if you continue to have pain with the gabapentin, take Norco, 1 tablet up to every 6 hours  For your Diabetes, we are increasing your insulin -take Novolog insulin 17 units three times per day -take Lantus insulin 56 units every evening  For your cough, take Guaifenesin (aka Mucinex), 1 tablet twice per day. -if you develop a fever or green sputum, call our clinic for further management  Please return for a follow-up visit in about 1 month.

## 2011-10-05 NOTE — Progress Notes (Signed)
HPI The patient is a 51 y.o. yo male with a history of HTN, HL, DM2, presenting for an acute visit for a foot wound.  The patient notes a 1-day history of a left foot ulcer.  He notes that yesterday, after returning from work, he removed his left sock, and some of the skin of his left foot was removed with the sock.  He notes some pain around the area, but no purulent discharge.  He wrapped the wound with protective bandages, and presented to clinic today.  The patient has a history of osteomyelitis of both feet, requiring several amputations.  The patient has a history of DM2.  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  His blood sugars have reportedly been in the 200's, though he did not bring his glucometer today. He is currently taking novolog 15 U with each meal, Lantus 52 units qhs, and metformin (note: our medication list noted that the patient was only taking novolog 12 U TID and lantus 44 U qhs, but the patient confirms that he has been taking the higher doses listed above).  He notes some chronic bilateral lower extremity tingling and burning pain, likely representing diabetic neuropathy.  He notes no episodes of hypoglycemia, or symptoms of shakiness, diaphoresis, or AMS.  He notes no polyuria, polydipsia, or blurred vision.  The patient also notes a 2-week history of non-productive cough.  The cough is worst when lying on his back at night, and is only present some days, not every day.  He notes no fevers, chills, or recent sick contacts.  He does note some associated nasal congestion.   ROS: General: no fevers, chills, changes in weight, changes in appetite Skin: no rash HEENT: no blurry vision, hearing changes, sore throat Pulm: no dyspnea, wheezing CV: no chest pain, palpitations, shortness of breath Abd: no abdominal pain, nausea/vomiting,  diarrhea/constipation GU: no dysuria, hematuria, polyuria Ext: no arthralgias, myalgias Neuro: no weakness  Filed Vitals:   10/05/11 1450  BP: 170/89  Pulse: 65  Temp: 98 F (36.7 C)    PEX General: alert, cooperative, and in no apparent distress HEENT: pupils equal round and reactive to light, vision grossly intact, oropharynx clear and non-erythematous  Neck: supple, no lymphadenopathy Lungs: clear to ascultation bilaterally, normal work of respiration, no wheezes, rales, ronchi Heart: regular rate and rhythm, no murmurs, gallops, or rubs Abdomen: soft, non-tender, non-distended, normal bowel sounds Extremities: Left foot with epidermis missing in well-demarcated area around all 4 remaining toes of left foot, with no exudate or surrounding edema or erythema.  Area is painful to palpation.  Subcutaneous tissue appears to be intact under missing epidermis, with no evidence of erosion into muscle or bone. Neurologic: alert & oriented X3, cranial nerves II-XII intact, strength grossly intact, sensation intact to light touch  Current Outpatient Prescriptions on File Prior to Visit  Medication Sig Dispense Refill  . amoxicillin-clavulanate (AUGMENTIN) 875-125 MG per tablet Take 1 tablet by mouth 2 (two) times daily.      [name] aspirin EC 81 MG tablet Take 81 mg by mouth daily.      [name] HYDROcodone-acetaminophen (VICODIN) 5-500 MG per tablet Take 1 tablet by mouth at bedtime.      . insulin aspart (NOVOLOG) 100 UNIT/ML injection Inject 12 Units into the skin 3 (three) times daily before meals.      [name]  insulin glargine (LANTUS) 100 UNIT/ML injection Inject 44 units at bedtime daily.  10 mL  prn  . lisinopril-hydrochlorothiazide (PRINZIDE,ZESTORETIC) 20-25 MG per tablet Take 1 tablet by mouth daily.      . metFORMIN (GLUCOPHAGE) 1000 MG tablet Take 1 tablet (1,000 mg total) by mouth 2 (two) times daily with a meal.  90 tablet  2  . pravastatin (PRAVACHOL) 40 MG tablet Take 40 mg by mouth daily.         Assessment/Plan

## 2011-10-05 NOTE — Assessment & Plan Note (Signed)
The patient notes a 2-week history of non-productive cough, worst when lying on back at night, with congestion, but with no fevers, chills, or sick contacts.  Symptoms likely represent post-nasal drip vs viral URI.  Low suspicion for pneumonia given lack of fever and clear lungs on physical exam. -guaifenesin prescribed -may try fluticasone nasal spray if symptoms do not improve -if patient develops fever or purulent cough, consider CXR to r/o PNA

## 2011-10-05 NOTE — Assessment & Plan Note (Addendum)
The patient has a history of poorly-controlled diabetes.  However, his A1C has decreased from >14 at his last visit, to 11.4 today.  We discussed at length the importance of tight glycemic control for wound healing. -increase Novolog from 15 U with meals, to 17 U with meals -increase Lantus from 52 U qhs, to 56 U qhs -flu vaccine given today

## 2011-10-13 ENCOUNTER — Encounter (HOSPITAL_BASED_OUTPATIENT_CLINIC_OR_DEPARTMENT_OTHER): Payer: Medicaid Other | Attending: General Surgery

## 2011-10-13 DIAGNOSIS — Z79899 Other long term (current) drug therapy: Secondary | ICD-10-CM | POA: Insufficient documentation

## 2011-10-13 DIAGNOSIS — L97509 Non-pressure chronic ulcer of other part of unspecified foot with unspecified severity: Secondary | ICD-10-CM | POA: Insufficient documentation

## 2011-10-13 DIAGNOSIS — Z794 Long term (current) use of insulin: Secondary | ICD-10-CM | POA: Insufficient documentation

## 2011-10-13 DIAGNOSIS — S98119A Complete traumatic amputation of unspecified great toe, initial encounter: Secondary | ICD-10-CM | POA: Insufficient documentation

## 2011-10-13 DIAGNOSIS — E1169 Type 2 diabetes mellitus with other specified complication: Secondary | ICD-10-CM | POA: Insufficient documentation

## 2011-10-13 NOTE — Progress Notes (Signed)
[facility]  NAMEMarland Kitchen  [name], DECLEENE NO.:  [id number]  MEDICAL RECORD NO.:  [id number]      DATE OF BIRTH:  [birth year]  PHYSICIAN:  Judene Companion, M.D.           VISIT DATE:                                  OFFICE VISIT   [name] who actually is a patient who has been here many times in the past.  He is a 51 year old type 2 diabetic who is currently taking insulin and Glucophage.  He has had his left great toe amputated because of diabetic foot ulcer and osteomyelitis.  He now has a second toe and fifth toe that are ulcerated and it appears to be not all the way through the dermis at this point.  It was infected and had some dead skin which I debrided away from both the 2nd and the 5th toes.  We got a culture and we noted that he is already on an antibiotic given to him by the hospital.  I think it was ampicillin and he has got enough till he comes back next week and we will check the ulcer cultures.  He works every day and he has to wear these particular shoes as he is on his feet all day long.  I told him to try to keep off his feet as much as he can and is going to be difficult as he has a fairly responsible job and has to be to work.  So, today we are treating him with silver alginate.  He will come back in a week and we will take another look.  So his vital signs while he was here were blood pressure 148/90, respirations 18, pulse 100, temperature 98.4.  He is a very healthy appearing diabetic with these diabetic foot ulcers and we will see him in a week and followup.     Judene Companion, M.D.     PP/MEDQ  D:  10/13/2011  T:  10/13/2011  Job:  [id number]

## 2011-11-04 ENCOUNTER — Encounter: Payer: Self-pay | Admitting: Internal Medicine

## 2011-11-04 ENCOUNTER — Ambulatory Visit (INDEPENDENT_AMBULATORY_CARE_PROVIDER_SITE_OTHER): Payer: BC Managed Care – PPO | Admitting: Internal Medicine

## 2011-11-04 VITALS — BP 200/114 | HR 101 | Temp 98.7°F | Wt 195.4 lb

## 2011-11-04 DIAGNOSIS — E1169 Type 2 diabetes mellitus with other specified complication: Secondary | ICD-10-CM

## 2011-11-04 DIAGNOSIS — L97509 Non-pressure chronic ulcer of other part of unspecified foot with unspecified severity: Secondary | ICD-10-CM

## 2011-11-04 DIAGNOSIS — E11621 Type 2 diabetes mellitus with foot ulcer: Secondary | ICD-10-CM

## 2011-11-04 DIAGNOSIS — R05 Cough: Secondary | ICD-10-CM

## 2011-11-04 DIAGNOSIS — I10 Essential (primary) hypertension: Secondary | ICD-10-CM

## 2011-11-04 DIAGNOSIS — R059 Cough, unspecified: Secondary | ICD-10-CM

## 2011-11-04 DIAGNOSIS — E119 Type 2 diabetes mellitus without complications: Secondary | ICD-10-CM

## 2011-11-04 MED ORDER — TRAMADOL HCL 50 MG PO TABS: 50.0000 mg | ORAL_TABLET | Freq: Four times a day (QID) | ORAL | Status: DC | PRN

## 2011-11-04 MED ORDER — LISINOPRIL 40 MG PO TABS: 40.0000 mg | ORAL_TABLET | Freq: Every day | ORAL | Status: DC

## 2011-11-04 MED ORDER — OMEPRAZOLE 20 MG PO CPDR: 20.0000 mg | DELAYED_RELEASE_CAPSULE | Freq: Every day | ORAL | Status: DC

## 2011-11-04 MED ORDER — HYDROCHLOROTHIAZIDE 25 MG PO TABS: 25.0000 mg | ORAL_TABLET | Freq: Every day | ORAL | Status: DC

## 2011-11-04 NOTE — Progress Notes (Signed)
Subjective:   Patient ID: [name] male   DOB: 1960-11-03 51 y.o.   MRN: [id number]  HPI: [name] is a 51 y.o. man with history of DM, HTN and HLD who presents today for follow up.  He was seen on 10/05/11 for a Wagner grade 1 (full thickness skin lesion) ulcer of his left foot involving skin around all 4 remaining toes on left foot with low suspicion for osteomyelitis.  Since then, he has been followed weekly by the wound center.  He feels like orthotics don't work - cause worsening blisters, does better with shoes that fit.  No fevers, and he has completed abx therapy from last visit.  Foot pain is 5/6 out of 10 - throbbing; the tingling/pins/needles sensation has resolved with neurontin  Has completed abx therapy, going to wound center once a week, No fevers  Checks CBGs BID, highest = 170, Lowest = 100 (didn't feel good - shaking and sweating), morning CBGs 150s  Works 7pm - 7am, makes Psychologist, forensic Meals include: soup, salad, grilled chicken, steak  Uses novolog 15u TIDWC, lantus 52u qHS - forgot to make changes suggested at last visit  A1c on 10/05/11 = 11.4, Novolog increased to 17u TIDWC and lantus 56u qHS  Cough: Drinks something cold then starts coughing excessively, feels it in base of throat and goes down to mid chest, sensation constantly there, few months, feels phlegm; scratchy throat but not sore, no significant indigestion, worse with laying down (if supine, feels pressure on right side), worse with certain foods/etoh; last felt like this when he had PNA, no fever or chest pain    Past Medical History  Diagnosis Date  . Hyperlipidemia   . Hypertension   . Osteomyelitis     left great toe, s/p amputation  . Osteomyelitis of ankle or foot 05/2011    rt foot  . Pneumonia     hx of  . Neuromuscular disorder     diabetic neruopathy  . Diabetes mellitus     Diagnosed in 42s; "yohoo freak"   Current Outpatient Prescriptions  Medication Sig Dispense  Refill  . amoxicillin-clavulanate (AUGMENTIN) 875-125 MG per tablet Take 1 tablet by mouth 2 (two) times daily.      [name] aspirin EC 81 MG tablet Take 81 mg by mouth daily.      [name] gabapentin (NEURONTIN) 300 MG capsule Take 1 capsule (300 mg total) by mouth 3 (three) times daily.  90 capsule  2  . guaiFENesin (MUCINEX) 600 MG 12 hr tablet Take 1 tablet (600 mg total) by mouth 2 (two) times daily.  60 tablet  0  . HYDROcodone-acetaminophen (VICODIN) 5-500 MG per tablet Take 1 tablet by mouth every 6 (six) hours as needed for pain.  30 tablet  0  . insulin aspart (NOVOLOG) 100 UNIT/ML injection Inject 17 Units into the skin 3 (three) times daily before meals.  1 vial  11  . insulin glargine (LANTUS) 100 UNIT/ML injection Inject 56 units at bedtime daily.  10 mL  11  . lisinopril-hydrochlorothiazide (PRINZIDE,ZESTORETIC) 20-25 MG per tablet Take 1 tablet by mouth daily.      . metFORMIN (GLUCOPHAGE) 1000 MG tablet Take 1 tablet (1,000 mg total) by mouth 2 (two) times daily with a meal.  90 tablet  2  . pravastatin (PRAVACHOL) 40 MG tablet Take 40 mg by mouth daily.       Family History  Problem Relation Age of Onset  . Diabetes Mother   .  Hypertension Brother   . Hypertension Sister   . Anesthesia problems Neg Hx    History   Social History  . Marital Status: Single    Spouse Name: N/A    Number of Children: N/A  . Years of Education: 12th   Occupational History  .  [employer]   Social History Main Topics  . Smoking status: Former Smoker    Quit date: 04/15/2005  . Smokeless tobacco: Never Used  . Alcohol Use: Yes     occassional  . Drug Use: No  . Sexually Active: Yes -- Male partner(s)    Birth Control/ Protection: Condom     one partner   Other Topics Concern  . 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  HEENT: Denies photophobia, eye pain, redness, hearing loss, ear pain, congestion, sore throat, rhinorrhea, sneezing, mouth sores, trouble swallowing, neck pain, neck stiffness and tinnitus.   Respiratory: Denies SOB, DOE, chest tightness,  and wheezing.   Cardiovascular: Denies chest pain, palpitations, no increase in leg swelling Gastrointestinal: Denies nausea, vomiting, abdominal pain, diarrhea, constipation, blood in stool and abdominal distention.  Genitourinary: Denies dysuria, urgency, frequency, hematuria, flank pain and difficulty urinating.  Musculoskeletal: Denies myalgias, back pain, joint swelling, arthralgias and gait problem.  Neurological: Denies dizziness, seizures, syncope, weakness, light-headedness, numbness and headaches.  Psychiatric/Behavioral: Denies suicidal ideation, mood changes, confusion, nervousness, sleep disturbance and agitation  Objective:  Physical Exam: Filed Vitals:   11/04/11 1318  BP: 206/118  Pulse: 109  Temp: 98.7 F (37.1 C)  TempSrc: Oral  Weight: 195 lb 6.4 oz (88.633 kg)   Constitutional: Vital signs reviewed.  Patient is a well-developed and well-nourished man in no acute distress and cooperative with exam.  Head: Normocephalic and atraumatic Mouth: no erythema or exudates, MMM Eyes: PERRL, EOMI, conjunctivae normal, No scleral icterus.  Cardiovascular: tachycardic, S1 normal, S2 normal, no MRG, pulses symmetric and intact bilaterally Pulmonary/Chest: CTAB, no wheezes, rales, or rhonchi Abdominal: Soft. Non-tender, non-distended, bowel sounds are normal, no masses, organomegaly, or guarding present.  Musculoskeletal: left foot (dorsal aspect) - well healing ulcer that is past granulation phase, minimally painful to palpation with subcutaneous tissue intact; 2-3 cm ulcer on plantar aspect of left foot  with granulation tissue and no purulent discharge/exudate Neurological: A&O x3, Strength is normal and symmetric bilaterally, cranial nerve II-XII are grossly intact, no focal motor deficit, sensory intact to light touch bilaterally. Normal gait. Skin: Warm, dry and intact. No rash, cyanosis, or clubbing.  Psychiatric: Normal mood and affect. speech and behavior is normal. Judgment and thought content normal. Cognition and memory are normal.   Assessment & Plan:  Case and care discussed with Dr. Daryll Drown Please see problem oriented charting for further details. Patient to return in  2 weeks  for BP check.

## 2011-11-04 NOTE — Patient Instructions (Signed)
Changes regarding your Diabetes: Increase lantus to 56u before bed - please bring your glucometer with you to your next visit  Changes regarding your Blood pressure:  Increase Lisinopril to 40 mg, and continue hydrochlorothiazide 25mg  daily - there is not a combination pill with these two doses; it is VERY important to get your pressure under better control, you are currently very high risk for a stroke;  Please limit your salt intake  Changes regarding your foot ulcer: I am adding tramadol 50mg  every 6 hours as needed for pain, you may also increase Gabapentin (Neurontin) to three times daily instead of twice daily  Regarding your cough: we will get a chest x-ray, but also, please try prilosec every day for at least 1 month; also cut out acidic foods such as tomatoes and orange juice, also cut back on drinks with caffeine such as coffee/sodas  Please be sure to bring all of your medications with you to every visit.  Should you have any new or worsening symptoms, please be sure to call the clinic at [phone].   Cardiac Diet This diet can help prevent heart disease and stroke. Many factors influence your heart health, including eating and exercise habits. Coronary risk rises a lot with abnormal blood fat (lipid) levels. Cardiac meal planning includes limiting unhealthy fats, increasing healthy fats, and making other small dietary changes. General guidelines are as follows:  Adjust calorie intake to reach and maintain desirable body weight.  Limit total fat intake to less than 30% of total calories. Saturated fat should be less than 7% of calories.  Saturated fats are found in animal products and in some vegetable products. Saturated vegetable fats are found in coconut oil, cocoa butter, palm oil, and palm kernel oil. Read labels carefully to avoid these products as much as possible. Use butter in moderation. Choose tub margarines and oils that have 2 grams of fat or less. Good cooking oils are  canola and olive oils.  Practice low-fat cooking techniques. Do not fry food. Instead, broil, bake, boil, steam, grill, roast on a rack, stir-fry, or microwave it. Other fat reducing suggestions include:  Remove the skin from poultry.  Remove all visible fat from meats.  Skim the fat off stews, soups, and gravies before serving them.  Steam vegetables in water or broth instead of sauting them in fat.  Avoid foods with trans fat (or hydrogenated oils), such as commercially fried foods and commercially baked goods. Commercial shortening and deep-frying fats will contain trans fat.  Increase intake of fruits, vegetables, whole grains, and legumes to replace foods high in fat.  Increase consumption of nuts, legumes, and seeds to at least 4 servings weekly. One serving of a legume equals  cup, and 1 serving of nuts or seeds equals  cup.  Choose whole grains more often. Have 3 servings per day (a serving is 1 ounce [oz]).  Eat 4 to 5 servings of vegetables per day. A serving of vegetables is 1 cup of raw leafy vegetables;  cup of raw or cooked cut-up vegetables;  cup of vegetable juice.  Eat 4 to 5 servings of fruit per day. A serving of fruit is 1 medium whole fruit;  cup of dried fruit;  cup of fresh, frozen, or canned fruit;  cup of 100% fruit juice.  Increase your intake of dietary fiber to 20 to 30 grams per day. Insoluble fiber may help lower your risk of heart disease and may help curb your appetite. Soluble fiber binds  cholesterol to be removed from the blood. Foods high in soluble fiber are dried beans, citrus fruits, oats, apples, bananas, broccoli, Brussels sprouts, and eggplant.  Try to include foods fortified with plant sterols or stanols, such as yogurt, breads, juices, or margarines. Choose several fortified foods to achieve a daily intake of 2 to 3 grams of plant sterols or stanols.  Foods with omega-3 fats can help reduce your risk of heart disease. Aim to have a 3.5 oz  portion of fatty fish twice per week, such as salmon, mackerel, albacore tuna, sardines, lake trout, or herring. If you wish to take a fish oil supplement, choose one that contains 1 gram of both DHA and EPA.  Limit processed meats to 2 servings (3 oz portion) weekly.  Limit the sodium in your diet to 1500 milligrams (mg) per day. If you have high blood pressure, talk to a registered dietitian about a DASH (Dietary Approaches to Stop Hypertension) eating plan.  Limit sweets and beverages with added sugar, such as soda, to no more than 5 servings per week. One serving is:   1 tablespoon sugar.  1 tablespoon jelly or jam.   cup sorbet.  1 cup lemonade.   cup regular soda. CHOOSING FOODS Starches  Allowed: Breads: All kinds (wheat, rye, raisin, white, oatmeal, [place], [place], and English muffin bread). Low-fat rolls: English muffins, frankfurter and hamburger buns, bagels, pita bread, tortillas (not fried). Pancakes, waffles, biscuits, and muffins made with recommended oil.  Avoid: Products made with saturated or trans fats, oils, or whole milk products. Butter rolls, cheese breads, croissants. Commercial doughnuts, muffins, sweet rolls, biscuits, waffles, pancakes, store-bought mixes. Crackers  Allowed: Low-fat crackers and snacks: Animal, graham, rye, saltine (with recommended oil, no lard), oyster, and matzo crackers. Bread sticks, melba toast, rusks, flatbread, pretzels, and light popcorn.  Avoid: High-fat crackers: cheese crackers, butter crackers, and those made with coconut, palm oil, or trans fat (hydrogenated oils). Buttered popcorn. Cereals  Allowed: Hot or cold whole-grain cereals.  Avoid: Cereals containing coconut, hydrogenated vegetable fat, or animal fat. Potatoes / Pasta / Rice  Allowed: All kinds of potatoes, rice, and pasta (such as macaroni, spaghetti, and noodles).  Avoid: Pasta or rice prepared with cream sauce or high-fat cheese. Chow mein noodles, [place]  fries. Vegetables  Allowed: All vegetables and vegetable juices.  Avoid: Fried vegetables. Vegetables in cream, butter, or high-fat cheese sauces. Limit coconut. Fruit in cream or custard. Protein  Allowed: Limit your intake of meat, seafood, and poultry to no more than 6 oz (cooked weight) per day. All lean, well-trimmed beef, veal, pork, and lamb. All chicken and [place] without skin. All fish and shellfish. Wild game: wild duck, rabbit, pheasant, and venison. Egg whites or low-cholesterol egg substitutes may be used as desired. Meatless dishes: recipes with dried beans, peas, lentils, and tofu (soybean curd). Seeds and nuts: all seeds and most nuts.  Avoid: Prime grade and other heavily marbled and fatty meats, such as short ribs, spare ribs, rib eye roast or steak, frankfurters, sausage, bacon, and high-fat luncheon meats, mutton. Caviar. Commercially fried fish. Domestic duck, goose, venison sausage. Organ meats: liver, gizzard, heart, chitterlings, brains, kidney, sweetbreads. Dairy  Allowed: Low-fat cheeses: nonfat or low-fat cottage cheese (1% or 2% fat), cheeses made with part skim milk, such as mozzarella, farmers, string, or ricotta. (Cheeses should be labeled no more than 2 to 6 grams fat per oz.). Skim (or 1%) milk: liquid, powdered, or evaporated. Buttermilk made with low-fat milk. Drinks made  with skim or low-fat milk or cocoa. Chocolate milk or cocoa made with skim or low-fat (1%) milk. Nonfat or low-fat yogurt.  Avoid: Whole milk cheeses, including colby, cheddar, muenster, Monterey Jack, [place], [place], [place], American, Swiss, and blue. Creamed cottage cheese, cream cheese. Whole milk and whole milk products, including buttermilk or yogurt made from whole milk, drinks made from whole milk. Condensed milk, evaporated whole milk, and 2% milk. Soups and Combination Foods  Allowed: Low-fat low-sodium soups: broth, dehydrated soups, homemade broth, soups with the fat removed,  homemade cream soups made with skim or low-fat milk. Low-fat spaghetti, lasagna, chili, and Spanish rice if low-fat ingredients and low-fat cooking techniques are used.  Avoid: Cream soups made with whole milk, cream, or high-fat cheese. All other soups. Desserts and Sweets  Allowed: Sherbet, fruit ices, gelatins, meringues, and angel food cake. Homemade desserts with recommended fats, oils, and milk products. Jam, jelly, honey, marmalade, sugars, and syrups. Pure sugar candy, such as gum drops, hard candy, jelly beans, marshmallows, mints, and small amounts of dark chocolate.  Avoid: Commercially prepared cakes, pies, cookies, frosting, pudding, or mixes for these products. Desserts containing whole milk products, chocolate, coconut, lard, palm oil, or palm kernel oil. Ice cream or ice cream drinks. Candy that contains chocolate, coconut, butter, hydrogenated fat, or unknown ingredients. Buttered syrups. Fats and Oils  Allowed: Vegetable oils: safflower, sunflower, corn, soybean, cottonseed, sesame, canola, olive, or peanut. Non-hydrogenated margarines. Salad dressing or mayonnaise: homemade or commercial, made with a recommended oil. Low or nonfat salad dressing or mayonnaise.  Limit added fats and oils to 6 to 8 tsp per day (includes fats used in cooking, baking, salads, and spreads on bread). Remember to count the "hidden fats" in foods.  Avoid: Solid fats and shortenings: butter, lard, salt pork, bacon drippings. Gravy containing meat fat, shortening, or suet. Cocoa butter, coconut. Coconut oil, palm oil, palm kernel oil, or hydrogenated oils: these ingredients are often used in bakery products, nondairy creamers, whipped toppings, candy, and commercially fried foods. Read labels carefully. Salad dressings made of unknown oils, sour cream, or cheese, such as blue cheese and Roquefort. Cream, all kinds: half-and-half, light, heavy, or whipping. Sour cream or cream cheese (even if "light" or  low-fat). Nondairy cream substitutes: coffee creamers and sour cream substitutes made with palm, palm kernel, hydrogenated oils, or coconut oil. Beverages  Allowed: Coffee (regular or decaffeinated), tea. Diet carbonated beverages, mineral water. Alcohol: Check with your caregiver. Moderation is recommended.  Avoid: Whole milk, regular sodas, and juice drinks with added sugar. Condiments  Allowed: All seasonings and condiments. Cocoa powder. "Cream" sauces made with recommended ingredients.  Avoid: Carob powder made with hydrogenated fats. SAMPLE MENU Breakfast   cup orange juice   cup oatmeal  1 slice toast  1 tsp margarine  1 cup skim milk Lunch  [place] sandwich with 2 oz [place], 2 slices bread  Lettuce and tomato slices  Fresh fruit  Carrot sticks  Coffee or tea Snack  Fresh fruit or low-fat crackers Dinner  3 oz lean ground beef  1 baked potato  1 tsp margarine   cup asparagus  Lettuce salad  1 tbs non-creamy dressing   cup peach slices  1 cup skim milk Document Released: 09/30/2007 Document Revised: 06/22/2011 Document Reviewed: 03/16/2011 [facility] Patient Information 2013 [place], [place].

## 2011-11-04 NOTE — Assessment & Plan Note (Signed)
No glucometer with him today.  Has not increased lantus to 56u, and continues to take 52units.  Asked him to increase to 56 units as suggested at last visit. He reports uncomfortable sugars with novolog 15, so will not ask him to increase to 17u as previously suggested. He should bring meter at follow up.

## 2011-11-04 NOTE — Assessment & Plan Note (Addendum)
Blood pressure significantly elevated (200/114 on recheck, 206/118 initially); though he is asymptomatic, he is at an increased stroke risk, and we spent a great deal of time discussing this. After lengthy discussion, we decided to increase lisinopril to 40mg  and continue HCTZ 25mg .  He does not want to initiate amlodipine yet because of leg swelling.  He reports that his blood pressure is never this high at home.  He will check his blood pressure everyday for the next 2 weeks and return for BP follow up.  If BP still elevated, plan to start amlodipine 5mg .  Another option is labetalol, but given symptomatic hypoglycemia (symptomatic at CBG = 100), I am hesitant to start this medication, but priority at this time will be BP control, so if he is adamant against amlodipine, then this may be the next best option. Hydralazine is an option as well, but less likely to be effective given multiple doses/day.  Also check CMET at follow up since lisinopril dose increased (if cough persists, may need to change lisinopril to losartan)

## 2011-11-04 NOTE — Assessment & Plan Note (Signed)
Denies rhinorrhea or sinus tenderness.  Likely GERD (sensation of something in throat, feels like clearing throat constantly, clear phlegm, worse with certain foods, worse with laying down) vs ACEI related.  He reports that symptoms are similar to when he had PNA, but he denies fever/fatigue/weakness.  -CXR (though less likely PNA, he does have h/o grade 2 diastolic dysfunction, so will monitor heart size and for pulm effusion)  -Trial of prilosec for 1 month -If symptoms persist, then will need to change ACEI to ARB

## 2011-11-04 NOTE — Assessment & Plan Note (Signed)
Appears to be healing well, though he has another foot ulcer on the plantar aspect of his foot being managed by the wound center - appears superficial.  He reports persistent throbbing pain, and he does stand for almost 12 hours straight for work.  He can increase gabapentin to 300 TID (only taking BID) and may start tramadol 50 q6h prn.  I am hesitant to increase pain mgmt, because he should use pain as a signal to elevate leg.

## 2011-11-05 LAB — MICROALBUMIN / CREATININE URINE RATIO
Creatinine, Urine: 109.4 mg/dL
Microalb, Ur: 107.95 mg/dL — ABNORMAL HIGH (ref 0.00–1.89)

## 2011-11-10 ENCOUNTER — Encounter (HOSPITAL_BASED_OUTPATIENT_CLINIC_OR_DEPARTMENT_OTHER): Payer: Medicaid Other | Attending: General Surgery

## 2011-11-10 DIAGNOSIS — Z794 Long term (current) use of insulin: Secondary | ICD-10-CM | POA: Insufficient documentation

## 2011-11-10 DIAGNOSIS — L97509 Non-pressure chronic ulcer of other part of unspecified foot with unspecified severity: Secondary | ICD-10-CM | POA: Insufficient documentation

## 2011-11-10 DIAGNOSIS — E1169 Type 2 diabetes mellitus with other specified complication: Secondary | ICD-10-CM | POA: Insufficient documentation

## 2011-11-10 DIAGNOSIS — S98119A Complete traumatic amputation of unspecified great toe, initial encounter: Secondary | ICD-10-CM | POA: Insufficient documentation

## 2011-11-17 ENCOUNTER — Encounter (HOSPITAL_BASED_OUTPATIENT_CLINIC_OR_DEPARTMENT_OTHER): Payer: BC Managed Care – PPO

## 2011-11-24 ENCOUNTER — Ambulatory Visit (HOSPITAL_COMMUNITY)
Admission: RE | Admit: 2011-11-24 | Discharge: 2011-11-24 | Disposition: A | Discharge status: Home / Self Care | Payer: BC Managed Care – PPO | Source: Ambulatory Visit | Attending: General Surgery | Admitting: General Surgery

## 2011-11-24 ENCOUNTER — Other Ambulatory Visit (HOSPITAL_COMMUNITY): Payer: Self-pay | Admitting: General Surgery

## 2011-11-24 ENCOUNTER — Other Ambulatory Visit (HOSPITAL_BASED_OUTPATIENT_CLINIC_OR_DEPARTMENT_OTHER): Payer: Self-pay | Admitting: General Surgery

## 2011-11-24 DIAGNOSIS — S98119A Complete traumatic amputation of unspecified great toe, initial encounter: Secondary | ICD-10-CM | POA: Insufficient documentation

## 2011-11-24 DIAGNOSIS — M79673 Pain in unspecified foot: Secondary | ICD-10-CM

## 2011-11-24 DIAGNOSIS — M79609 Pain in unspecified limb: Secondary | ICD-10-CM | POA: Insufficient documentation

## 2011-11-25 ENCOUNTER — Other Ambulatory Visit (HOSPITAL_COMMUNITY): Payer: Self-pay | Admitting: General Surgery

## 2011-11-25 DIAGNOSIS — I739 Peripheral vascular disease, unspecified: Secondary | ICD-10-CM

## 2011-11-25 DIAGNOSIS — R0989 Other specified symptoms and signs involving the circulatory and respiratory systems: Secondary | ICD-10-CM

## 2011-11-30 ENCOUNTER — Ambulatory Visit (HOSPITAL_COMMUNITY)
Admission: RE | Admit: 2011-11-30 | Discharge: 2011-11-30 | Disposition: A | Discharge status: Home / Self Care | Payer: BC Managed Care – PPO | Source: Ambulatory Visit | Attending: General Surgery | Admitting: General Surgery

## 2011-11-30 ENCOUNTER — Encounter (HOSPITAL_COMMUNITY): Payer: BC Managed Care – PPO

## 2011-11-30 DIAGNOSIS — I739 Peripheral vascular disease, unspecified: Secondary | ICD-10-CM

## 2011-11-30 DIAGNOSIS — R0989 Other specified symptoms and signs involving the circulatory and respiratory systems: Secondary | ICD-10-CM | POA: Insufficient documentation

## 2011-11-30 NOTE — Progress Notes (Signed)
Bilateral LE Venous Duplex Completed. [name]

## 2011-11-30 NOTE — Progress Notes (Signed)
Bilateral Arterial Duplex Completed. Alla German

## 2011-12-08 ENCOUNTER — Encounter (HOSPITAL_BASED_OUTPATIENT_CLINIC_OR_DEPARTMENT_OTHER): Payer: Medicaid Other | Attending: General Surgery

## 2011-12-08 ENCOUNTER — Ambulatory Visit (INDEPENDENT_AMBULATORY_CARE_PROVIDER_SITE_OTHER): Payer: BC Managed Care – PPO | Admitting: Internal Medicine

## 2011-12-08 ENCOUNTER — Encounter: Payer: Self-pay | Admitting: Internal Medicine

## 2011-12-08 VITALS — BP 151/79 | HR 111 | Temp 99.1°F | Ht 72.0 in | Wt 191.5 lb

## 2011-12-08 DIAGNOSIS — E11621 Type 2 diabetes mellitus with foot ulcer: Secondary | ICD-10-CM

## 2011-12-08 DIAGNOSIS — E118 Type 2 diabetes mellitus with unspecified complications: Secondary | ICD-10-CM

## 2011-12-08 DIAGNOSIS — Z79899 Other long term (current) drug therapy: Secondary | ICD-10-CM

## 2011-12-08 DIAGNOSIS — E1165 Type 2 diabetes mellitus with hyperglycemia: Secondary | ICD-10-CM

## 2011-12-08 DIAGNOSIS — M9080 Osteopathy in diseases classified elsewhere, unspecified site: Secondary | ICD-10-CM | POA: Insufficient documentation

## 2011-12-08 DIAGNOSIS — L97509 Non-pressure chronic ulcer of other part of unspecified foot with unspecified severity: Secondary | ICD-10-CM

## 2011-12-08 DIAGNOSIS — M869 Osteomyelitis, unspecified: Secondary | ICD-10-CM | POA: Insufficient documentation

## 2011-12-08 DIAGNOSIS — E1169 Type 2 diabetes mellitus with other specified complication: Secondary | ICD-10-CM | POA: Insufficient documentation

## 2011-12-08 DIAGNOSIS — I10 Essential (primary) hypertension: Secondary | ICD-10-CM

## 2011-12-08 LAB — GLUCOSE, CAPILLARY
Glucose-Capillary: 292 mg/dL — ABNORMAL HIGH (ref 70–99)
Glucose-Capillary: 93 mg/dL (ref 70–99)

## 2011-12-08 MED ORDER — TRAMADOL HCL 50 MG PO TABS: 50.0000 mg | ORAL_TABLET | Freq: Four times a day (QID) | ORAL | Status: DC | PRN

## 2011-12-08 NOTE — Patient Instructions (Addendum)
General Instructions:  Please follow-up at the clinic in 1 week , at which time we will reevaluate your left foot, your diabetes, and your blood pressure - OR, please follow-up in the clinic sooner if needed.  There have not been changes in your medications   Start checking your blood sugars 3 times a day before If you have been started on new medication(s), and you develop symptoms concerning for allergic reaction, including, but not limited to, throat closing, tongue swelling, rash, please stop the medication immediately and call the clinic at [phone], and go to the ER.  Please follow-up with the social worker regarding the disability paperwork.  If you are diabetic, please bring your meter to your next visit.  If symptoms worsen, or new symptoms arise, please call the clinic or go to the ER.  Please bring all of your medications in a bag to your next visit.    Treatment Goals:  Goals (1 Years of Data) as of 12/08/2011          As of Today 11/04/11 11/04/11 10/05/11 07/01/11     Blood Pressure    . Blood Pressure < 140/90  151/79 200/114 206/118 170/89 162/85     Result Component    . HEMOGLOBIN A1C < 7.0     11.4     . LDL CALC < 100            Progress Toward Treatment Goals:  Treatment Goal 12/08/2011  Hemoglobin A1C improved  Blood pressure unchanged    Self Care Goals & Plans:  Self Care Goal 12/08/2011  Manage my medications take my medicines as prescribed; refill my medications on time  Monitor my health keep track of my blood glucose; keep track of my weight; keep track of my blood pressure  Eat healthy foods eat more vegetables; drink diet soda or water instead of juice or soda; eat foods that are low in salt    Home Blood Glucose Monitoring 12/08/2011  Check my blood sugar 3 times a day  When to check my blood sugar before meals     Care Management & Community Referrals:  Referral 12/08/2011  Referrals made for care management support social worker
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
## 2011-12-09 NOTE — Assessment & Plan Note (Signed)
Pertinent Labs:   07/28/2009  04/16/2010  05/12/2011  10/05/2011  12/08/2011   Hemoglobin A1C 13.4... (H) 10.3 >14.0 11.4 10.6    Assessment: Disease Control: poor control (HgbA1C >9%)  Progress toward goals: improved  Barriers to meeting goals: nonadherence to medications and unclear what medications he is taking (pharmacy records indicate has not gotten Lantus over last 2-3 months), Novolog in October  Microvascular complications: peripheral neuropathy  Macrovascular complications: peripheral vascular disease  On aspirin: Yes    On statin: Yes  On ACE-I/ ARB: Yes    Patient is probably noncompliant though I cannot elicit that specific history with prescribed medications.   Plan: Glucometer log was not reviewed today, as pt did not have glucometer available for review.   continue current medications - Lantus 57 units and Novolog 15 units TID  reminded to bring blood glucose meter & log to each visit and discussed foot care  Educational resources provided:  None  Self management tools provided:  None  Home glucose monitoring recommendation: 3 times a day before meals

## 2011-12-09 NOTE — Assessment & Plan Note (Signed)
Assessment: Following weekly at wound care. Pt states he is on Bactrim at this time for his wound. No evidence of acute worsening of wound today. 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 He has previously attempted to continue working, however, has had worsening of symptoms/ disease with these attempts.  Plan:      Continue per wound care instructions.  Requesting records from Dr. Jerline Pain.  Tramadol refilled today.  SW consult to evaluate for possible disability

## 2011-12-09 NOTE — Assessment & Plan Note (Signed)
Pertinent Data: BP Readings from Last 3 Encounters:  12/08/11 151/79  11/04/11 200/114  10/05/11 0000000    Basic Metabolic Panel:    Component Value Date/Time   NA 137 06/08/2011 1538   K 3.6 06/08/2011 1538   CL 98 06/08/2011 1538   CO2 28 06/08/2011 1538   BUN 17 06/08/2011 1538   CREATININE 1.00 06/08/2011 1538   CREATININE 1.13 04/30/2010 1502   GLUCOSE 164* 06/08/2011 1538   CALCIUM 9.6 06/08/2011 1538    Assessment: Disease Control: moderately elevated  Progress toward goals: improved  Barriers to meeting goals: nonadherence to medications and medication confusion    Patient does not know what medications he is taking, but states that he is missing them only 2 times per week.   Spoke with his pharmacy who state he has refills of Lisinopril and HCTZ (sent by Dr. Burnard Bunting in 10/2011) that he has never picked up.  Patient is probably noncompliant though I cannot elicit that specific history with prescribed medications.   Plan:  continue current medications  He is instructed to come to see me again in a week with all of his medications in a bag. We will at that time determine what are the appropriate medications for him to be on.  He was again explained about the complications, morbidities that can occur with poorly controlled HTN and continued medication non-adherence.  Educational resources provided:  None  Self management tools provided:  None

## 2011-12-14 ENCOUNTER — Ambulatory Visit: Payer: BC Managed Care – PPO | Admitting: Internal Medicine

## 2011-12-15 ENCOUNTER — Telehealth: Payer: Self-pay | Admitting: Licensed Clinical Social Worker

## 2011-12-15 NOTE — Telephone Encounter (Signed)
[name] was referred to [place] by physician for pt to receive information on disability.  Pt's EMR indicates pt is employed.  CSW placed called to pt.  CSW left message requesting return call. CSW provided contact hours and phone number.

## 2011-12-16 ENCOUNTER — Encounter: Payer: Self-pay | Admitting: Licensed Clinical Social Worker

## 2011-12-16 NOTE — Telephone Encounter (Signed)
CSW returned call to Mr. Noria.  Mr. Badgley states he inquired about disability with Dr. Guy Sandifer based on a conversation pt had with his Wound Care physician.  Pt states he lost his job due to inability to maintain his productivity in light of his medical condition.  Mr. Monohan states he has worked on his feet all of his life and most recently worked 12 hr shifts on his feet.  Pt is now unemployed and uninsured.  CSW encouraged Mr. Fels to complete an [employer] application and explained the benefits of having an [employer].  Provided pt with information on applying for disability.  Pt has not applied and does not have application yet.  Explained the need to have [employer] during the disability application time frame.  CSW mailed information to Mr. Giustino along with Crisis assistance information from [employer] and [employer].

## 2011-12-20 ENCOUNTER — Other Ambulatory Visit (HOSPITAL_COMMUNITY): Payer: Self-pay | Admitting: Cardiovascular Disease

## 2011-12-20 DIAGNOSIS — I10 Essential (primary) hypertension: Secondary | ICD-10-CM

## 2011-12-20 DIAGNOSIS — E119 Type 2 diabetes mellitus without complications: Secondary | ICD-10-CM

## 2011-12-20 DIAGNOSIS — I739 Peripheral vascular disease, unspecified: Secondary | ICD-10-CM

## 2011-12-22 ENCOUNTER — Encounter (HOSPITAL_COMMUNITY): Payer: Self-pay | Admitting: Respiratory Therapy

## 2011-12-24 ENCOUNTER — Ambulatory Visit (HOSPITAL_COMMUNITY)
Admission: RE | Admit: 2011-12-24 | Discharge: 2011-12-24 | Disposition: A | Discharge status: Home / Self Care | Payer: Medicaid Other | Source: Ambulatory Visit | Attending: Cardiovascular Disease | Admitting: Cardiovascular Disease

## 2011-12-24 DIAGNOSIS — E669 Obesity, unspecified: Secondary | ICD-10-CM | POA: Insufficient documentation

## 2011-12-24 DIAGNOSIS — I739 Peripheral vascular disease, unspecified: Secondary | ICD-10-CM | POA: Insufficient documentation

## 2011-12-24 DIAGNOSIS — I10 Essential (primary) hypertension: Secondary | ICD-10-CM | POA: Insufficient documentation

## 2011-12-24 DIAGNOSIS — E119 Type 2 diabetes mellitus without complications: Secondary | ICD-10-CM | POA: Insufficient documentation

## 2011-12-24 DIAGNOSIS — F17200 Nicotine dependence, unspecified, uncomplicated: Secondary | ICD-10-CM | POA: Insufficient documentation

## 2011-12-24 DIAGNOSIS — E785 Hyperlipidemia, unspecified: Secondary | ICD-10-CM | POA: Insufficient documentation

## 2011-12-24 MED ORDER — TECHNETIUM TC 99M SESTAMIBI GENERIC - CARDIOLITE
10.4000 | Freq: Once | INTRAVENOUS | Status: AC | PRN
  Administered 2011-12-24: 10 via INTRAVENOUS

## 2011-12-24 MED ORDER — REGADENOSON 0.4 MG/5ML IV SOLN
0.4000 mg | Freq: Once | INTRAVENOUS | Status: AC
  Administered 2011-12-24: 0.4 mg via INTRAVENOUS

## 2011-12-24 MED ORDER — TECHNETIUM TC 99M SESTAMIBI GENERIC - CARDIOLITE
30.3000 | Freq: Once | INTRAVENOUS | Status: AC | PRN
  Administered 2011-12-24: 30.3 via INTRAVENOUS

## 2011-12-24 NOTE — Procedures (Addendum)
[facility] [address] [place] [address] [id number]  Cardiology Nuclear Med Study  [name] is a 51 y.o. male     MRN : [id number]     DOB: 03-23-1960  Procedure Date: 12/24/2011  Nuclear Med Background Indication for Stress Test:  Evaluation for Ischemia and Surgical Clearance for abdominal aortography with Bi-femoral runoff for potential endovascular therapy History:  No known prior history of CAD Cardiac Risk Factors: Hypertension, IDDM Type 2, Lipids, Obesity and Smoker  Symptoms:  No symptoms   Nuclear Pre-Procedure Caffeine/Decaff Intake:  10:30pm NPO After: 8:30am   IV Site: R Antecubital x 1, tolerated well IV 0.9% NS with Angio Cath:  22g  Chest Size (in):  40 IV Started by: Irven Baltimore, RN  Height: 6' (1.829 m)  Cup Size: n/a  BMI:  Body mass index is 25.09 kg/(m^2). Weight:  185 lb (83.915 kg)   Tech Comments:  No insulin last night or this am    Nuclear Med Study 1 or 2 day study: 1 day  Stress Test Type:  [place] Provider:  Quay Burow, MD   Resting Radionuclide: Technetium 5m Sestamibi  Resting Radionuclide Dose: 10.4 mCi   Stress Radionuclide:  Technetium 93m Sestamibi  Stress Radionuclide Dose: 30.3 mCi           Stress Protocol Rest HR: 99 Stress HR: 111  Rest BP: 177/95 Stress BP: 161/82  Exercise Time (min): n/a METS: n/a          Dose of Adenosine (mg):  n/a Dose of Lexiscan: 0.4 mg  Dose of Atropine (mg): n/a Dose of Dobutamine: n/a mcg/kg/min (at max HR)  Stress Test Technologist: Mellody Memos, CCT Nuclear Technologist: Imagene Riches, CNMT   Rest Procedure:  Myocardial perfusion imaging was performed at rest 45 minutes following the intravenous administration of Technetium 15m Sestamibi. Stress Procedure:  The patient received IV Lexiscan 0.4 mg over 15-seconds.  Technetium 26m Sestamibi injected at 30-seconds.  There were no significant changes with  Lexiscan.  Quantitative spect images were obtained after a 45 minute delay.  Transient Ischemic Dilatation (Normal <1.22):  1.05 Lung/Heart Ratio (Normal <0.45):  0.29 QGS EDV:  120 ml QGS ESV:  71 ml LV Ejection Fraction: 41%        Rest ECG: NSR - Normal EKG  Stress ECG: No significant change from baseline ECG  QPS Raw Data Images:  There is interference from nuclear activity from structures below the diaphragm. This does not affect the ability to read the study. Stress Images:  Normal homogeneous uptake in all areas of the myocardium. Rest Images:  Normal homogeneous uptake in all areas of the myocardium. Subtraction (SDS):  No evidence of ischemia.  Impression Exercise Capacity:  Lexiscan with no exercise. BP Response:  Normal blood pressure response. Clinical Symptoms:  No significant symptoms noted. ECG Impression:  No significant ST segment change suggestive of ischemia. Comparison with Prior Nuclear Study: No images to compare  Overall Impression:  Normal stress nuclear study.  LV Wall Motion:  There is mod to severe LV dysfunction   Lorretta Harp, MD  12/24/2011 5:17 PM

## 2011-12-30 ENCOUNTER — Encounter (HOSPITAL_COMMUNITY)
Admission: RE | Disposition: A | Discharge status: Home / Self Care | Payer: Self-pay | Source: Ambulatory Visit | Attending: Cardiovascular Disease

## 2011-12-30 ENCOUNTER — Encounter (HOSPITAL_COMMUNITY): Payer: Self-pay | Admitting: General Practice

## 2011-12-30 ENCOUNTER — Ambulatory Visit (HOSPITAL_COMMUNITY)
Admission: RE | Admit: 2011-12-30 | Discharge: 2011-12-31 | Disposition: A | Discharge status: Home / Self Care | Payer: Medicaid Other | Source: Ambulatory Visit | Attending: Cardiovascular Disease | Admitting: Cardiovascular Disease

## 2011-12-30 DIAGNOSIS — E119 Type 2 diabetes mellitus without complications: Secondary | ICD-10-CM | POA: Diagnosis present

## 2011-12-30 DIAGNOSIS — IMO0002 Reserved for concepts with insufficient information to code with codable children: Secondary | ICD-10-CM | POA: Insufficient documentation

## 2011-12-30 DIAGNOSIS — R05 Cough: Secondary | ICD-10-CM

## 2011-12-30 DIAGNOSIS — I10 Essential (primary) hypertension: Secondary | ICD-10-CM | POA: Insufficient documentation

## 2011-12-30 DIAGNOSIS — E785 Hyperlipidemia, unspecified: Secondary | ICD-10-CM | POA: Insufficient documentation

## 2011-12-30 DIAGNOSIS — R059 Cough, unspecified: Secondary | ICD-10-CM

## 2011-12-30 DIAGNOSIS — E118 Type 2 diabetes mellitus with unspecified complications: Secondary | ICD-10-CM | POA: Insufficient documentation

## 2011-12-30 DIAGNOSIS — I70229 Atherosclerosis of native arteries of extremities with rest pain, unspecified extremity: Secondary | ICD-10-CM | POA: Diagnosis present

## 2011-12-30 DIAGNOSIS — E1165 Type 2 diabetes mellitus with hyperglycemia: Secondary | ICD-10-CM

## 2011-12-30 DIAGNOSIS — E11621 Type 2 diabetes mellitus with foot ulcer: Secondary | ICD-10-CM

## 2011-12-30 DIAGNOSIS — Z95820 Peripheral vascular angioplasty status with implants and grafts: Secondary | ICD-10-CM

## 2011-12-30 DIAGNOSIS — L98499 Non-pressure chronic ulcer of skin of other sites with unspecified severity: Secondary | ICD-10-CM | POA: Insufficient documentation

## 2011-12-30 DIAGNOSIS — L97509 Non-pressure chronic ulcer of other part of unspecified foot with unspecified severity: Secondary | ICD-10-CM

## 2011-12-30 DIAGNOSIS — I998 Other disorder of circulatory system: Secondary | ICD-10-CM | POA: Diagnosis present

## 2011-12-30 DIAGNOSIS — I739 Peripheral vascular disease, unspecified: Secondary | ICD-10-CM | POA: Insufficient documentation

## 2011-12-30 DIAGNOSIS — Z87891 Personal history of nicotine dependence: Secondary | ICD-10-CM | POA: Insufficient documentation

## 2011-12-30 HISTORY — DX: Type 2 diabetes mellitus without complications: E11.9

## 2011-12-30 HISTORY — PX: SP PTA PERIPHERAL: HXRAD401

## 2011-12-30 HISTORY — DX: Peripheral vascular disease, unspecified: I73.9

## 2011-12-30 HISTORY — DX: Peripheral vascular angioplasty status with implants and grafts: Z95.820

## 2011-12-30 HISTORY — PX: ABDOMINAL ANGIOGRAM: SHX5499

## 2011-12-30 HISTORY — PX: PERCUTANEOUS STENT INTERVENTION: SHX5500

## 2011-12-30 HISTORY — DX: Other disorder of circulatory system: I99.8

## 2011-12-30 LAB — BASIC METABOLIC PANEL
BUN: 13 mg/dL (ref 6–23)
Calcium: 9.8 mg/dL (ref 8.4–10.5)
Chloride: 93 mEq/L — ABNORMAL LOW (ref 96–112)
Creatinine, Ser: 1.11 mg/dL (ref 0.50–1.35)
GFR calc Af Amer: 87 mL/min — ABNORMAL LOW (ref >90)

## 2011-12-30 LAB — CBC
HCT: 32.4 % — ABNORMAL LOW (ref 39.0–52.0)
MCH: 27.9 pg (ref 26.0–34.0)
MCHC: 33.6 g/dL (ref 30.0–36.0)
MCV: 83.1 fL (ref 78.0–100.0)
Platelets: 338 10*3/uL (ref 150–400)
RDW: 12.4 % (ref 11.5–15.5)
WBC: 11 10*3/uL — ABNORMAL HIGH (ref 4.0–10.5)

## 2011-12-30 LAB — GLUCOSE, CAPILLARY: Glucose-Capillary: 218 mg/dL — ABNORMAL HIGH (ref 70–99)

## 2011-12-30 LAB — POCT ACTIVATED CLOTTING TIME
Activated Clotting Time: 268 seconds
Activated Clotting Time: 176 seconds

## 2011-12-30 LAB — PROTIME-INR: Prothrombin Time: 14.4 seconds (ref 11.6–15.2)

## 2011-12-30 SURGERY — ABDOMINAL ANGIOGRAM
Anesthesia: LOCAL

## 2011-12-30 MED ORDER — HYDRALAZINE HCL 20 MG/ML IJ SOLN: 10.0000 mg | INTRAMUSCULAR | Status: DC

## 2011-12-30 MED ORDER — ACETAMINOPHEN 325 MG PO TABS: 650.0000 mg | ORAL_TABLET | ORAL | Status: DC | PRN

## 2011-12-30 MED ORDER — ZOLPIDEM TARTRATE 5 MG PO TABS: 10.0000 mg | ORAL_TABLET | Freq: Every evening | ORAL | Status: DC | PRN

## 2011-12-30 MED ORDER — FENTANYL CITRATE 0.05 MG/ML IJ SOLN
INTRAMUSCULAR | Status: AC
  Filled 2011-12-30: qty 2

## 2011-12-30 MED ORDER — SODIUM CHLORIDE 0.9 % IV SOLN
INTRAVENOUS | Status: DC
  Administered 2011-12-30: 11:00:00 via INTRAVENOUS

## 2011-12-30 MED ORDER — LIDOCAINE HCL (PF) 1 % IJ SOLN
INTRAMUSCULAR | Status: AC
  Filled 2011-12-30: qty 30

## 2011-12-30 MED ORDER — LISINOPRIL 40 MG PO TABS
40.0000 mg | ORAL_TABLET | Freq: Every day | ORAL | Status: DC
  Administered 2011-12-30 – 2011-12-31 (×2): 40 mg via ORAL
  Filled 2011-12-30 (×2): qty 1

## 2011-12-30 MED ORDER — ONDANSETRON HCL 4 MG/2ML IJ SOLN: 4.0000 mg | Freq: Four times a day (QID) | INTRAMUSCULAR | Status: DC | PRN

## 2011-12-30 MED ORDER — METFORMIN HCL ER 750 MG PO TB24
750.0000 mg | ORAL_TABLET | Freq: Two times a day (BID) | ORAL | Status: DC
  Filled 2011-12-30 (×3): qty 1

## 2011-12-30 MED ORDER — INSULIN GLARGINE 100 UNIT/ML ~~LOC~~ SOLN
57.0000 [IU] | Freq: Every day | SUBCUTANEOUS | Status: DC
  Administered 2011-12-30: 57 [IU] via SUBCUTANEOUS

## 2011-12-30 MED ORDER — TRAMADOL HCL 50 MG PO TABS
50.0000 mg | ORAL_TABLET | Freq: Four times a day (QID) | ORAL | Status: DC | PRN
  Administered 2011-12-31: 50 mg via ORAL
  Filled 2011-12-30: qty 1

## 2011-12-30 MED ORDER — PANTOPRAZOLE SODIUM 40 MG PO TBEC
40.0000 mg | DELAYED_RELEASE_TABLET | Freq: Every day | ORAL | Status: DC
  Administered 2011-12-30 – 2011-12-31 (×2): 40 mg via ORAL
  Filled 2011-12-30 (×2): qty 1

## 2011-12-30 MED ORDER — INSULIN ASPART 100 UNIT/ML ~~LOC~~ SOLN
15.0000 [IU] | Freq: Three times a day (TID) | SUBCUTANEOUS | Status: DC
  Administered 2011-12-30 – 2011-12-31 (×3): 15 [IU] via SUBCUTANEOUS

## 2011-12-30 MED ORDER — HEPARIN SODIUM (PORCINE) 1000 UNIT/ML IJ SOLN
INTRAMUSCULAR | Status: AC
  Filled 2011-12-30: qty 1

## 2011-12-30 MED ORDER — ASPIRIN EC 325 MG PO TBEC
325.0000 mg | DELAYED_RELEASE_TABLET | Freq: Every day | ORAL | Status: DC
  Administered 2011-12-31: 325 mg via ORAL
  Filled 2011-12-30 (×3): qty 1

## 2011-12-30 MED ORDER — GABAPENTIN 300 MG PO CAPS
300.0000 mg | ORAL_CAPSULE | Freq: Three times a day (TID) | ORAL | Status: DC
  Administered 2011-12-30 – 2011-12-31 (×3): 300 mg via ORAL
  Filled 2011-12-30 (×5): qty 1

## 2011-12-30 MED ORDER — SODIUM CHLORIDE 0.9 % IV SOLN
INTRAVENOUS | Status: AC
  Administered 2011-12-30: 16:00:00 via INTRAVENOUS

## 2011-12-30 MED ORDER — SIMVASTATIN 40 MG PO TABS
40.0000 mg | ORAL_TABLET | Freq: Every day | ORAL | Status: DC
  Administered 2011-12-30: 40 mg via ORAL
  Filled 2011-12-30 (×2): qty 1

## 2011-12-30 MED ORDER — HYDROCHLOROTHIAZIDE 25 MG PO TABS
25.0000 mg | ORAL_TABLET | Freq: Every day | ORAL | Status: DC
  Administered 2011-12-30 – 2011-12-31 (×2): 25 mg via ORAL
  Filled 2011-12-30 (×2): qty 1

## 2011-12-30 MED ORDER — CLOPIDOGREL BISULFATE 75 MG PO TABS
75.0000 mg | ORAL_TABLET | Freq: Every day | ORAL | Status: DC
  Administered 2011-12-31: 75 mg via ORAL
  Filled 2011-12-30: qty 1

## 2011-12-30 MED ORDER — HEPARIN (PORCINE) IN NACL 2-0.9 UNIT/ML-% IJ SOLN
INTRAMUSCULAR | Status: AC
  Filled 2011-12-30: qty 1000

## 2011-12-30 MED ORDER — SULFAMETHOXAZOLE-TRIMETHOPRIM 800-160 MG PO TABS
1.0000 | ORAL_TABLET | Freq: Every day | ORAL | Status: DC
  Administered 2011-12-30 – 2011-12-31 (×2): 1 via ORAL
  Filled 2011-12-30 (×2): qty 1

## 2011-12-30 MED ORDER — MIDAZOLAM HCL 2 MG/2ML IJ SOLN
INTRAMUSCULAR | Status: AC
  Filled 2011-12-30: qty 2

## 2011-12-30 MED ORDER — CLOPIDOGREL BISULFATE 300 MG PO TABS
ORAL_TABLET | ORAL | Status: AC
  Administered 2011-12-31: 09:00:00 75 mg via ORAL
  Filled 2011-12-30: qty 1

## 2011-12-30 MED ORDER — ASPIRIN EC 81 MG PO TBEC
81.0000 mg | DELAYED_RELEASE_TABLET | Freq: Every day | ORAL | Status: DC
  Administered 2011-12-31: 09:00:00 81 mg via ORAL
  Filled 2011-12-30 (×2): qty 1

## 2011-12-30 MED ORDER — SODIUM CHLORIDE 0.9 % IJ SOLN: 3.0000 mL | INTRAMUSCULAR | Status: DC | PRN

## 2011-12-30 MED ORDER — ASPIRIN 81 MG PO CHEW
CHEWABLE_TABLET | ORAL | Status: AC
  Filled 2011-12-30: qty 4

## 2011-12-30 MED ORDER — ALPRAZOLAM 0.25 MG PO TABS: 0.2500 mg | ORAL_TABLET | Freq: Three times a day (TID) | ORAL | Status: DC | PRN

## 2011-12-30 MED ORDER — DIAZEPAM 5 MG PO TABS
5.0000 mg | ORAL_TABLET | ORAL | Status: AC
  Administered 2011-12-30: 5 mg via ORAL
  Filled 2011-12-30: qty 1

## 2011-12-30 NOTE — Progress Notes (Signed)
Utilization Review Completed.   Othel Dicostanzo, RN, BSN Nurse Case Manager  [phone]  

## 2011-12-30 NOTE — Op Note (Signed)
[name] is a 51 y.o. male    [id number] LOCATION:  FACILITY: [facility]  PHYSICIAN: Quay Burow, M.D. February 06, 1960   DATE OF PROCEDURE:  12/30/2011  DATE OF DISCHARGE:  [place]  PV Intervention    History obtained from chart review.  [name] is a 51 year old single African American male father of 62, grandfather and 5 grandchildren referred to me by Dr. Jerline Pain at [facility] wound care center for per peripheral vascular dilation because of nonhealing left heel ulcers/critical limb ischemia. 2 factors include 5815 and 20-pack-year history tobacco abuse having quit back in 2005, hypertension, hyperlipidemia and diabetes. Is complaining of left calf claudication where the right and left 2 months with development of ulcer on his left foot during that time. Dopplers in the office November 26 showed a right ABI of 0.84 a left of 0.60. He presents now for angiography and potential for today's intervention for critical limb ischemia.   PROCEDURE DESCRIPTION:    The patient was brought to the second floor  [facility] lab in the postabsorptive state. He was premedicated with Valium 5 mg by mouth, IV Versed and fentanyl.[name] His right groinwas prepped and shaved in usual sterile fashion. Xylocaine 1% was used  for local anesthesia. A 5 French sheath was inserted into the right common femoral artery using standard Seldinger technique. The patient received 8500 units  of heparin  intravenously.  If I [place] pigtail catheter was used for abdominal aortography with bifemoral runoff, using digital subtraction bolus chase step table technique. Visipaque dye was used for the entirety of the case. Retrograde aortic pressures monitored in the case.    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  2: Left lower extremity-there was a 30% proximal left common iliac artery stenosis. There was an 80% segmental proximal left SFA stenosis. There was a 60% segmental below-knee popliteal artery stenosis. There was two-vessel runoff with occluded peroneal. The anterior tibial has 70% ostial stenosis and segmental PMM 99% stenoses in the proximal to mid third. The posterior tibial had a 95% stenosis just after the take off of the peroneal.  3: Right lower extremity-is a 75% segmental mid right SFA stenosis. The below the knee vasculature was not felt well visualized.  IMPRESSION:[name] has high-grade left SFA and tibial vessel disease with critical limb ischemia. Proceed with endoscopic intervention of the left anterior posterior tib plus or minus stenting as well as stenting of the left SFA.  Procedure description: Contralateral access obtained with a 5 [place] crossover catheter, 35 VersaCore wire and 6 [place] destination she. A total of 345 cc of contrast was administered to the patient. The ACT was measured at 268. Using an 014/300 cm length [place] core wire along with a 3 mm x 2 cm long Angiosculpt balloon intervention was performed on the anterior and posterior tibial vessels.there was a obvious dissection and the posterior tibial vessel which was the critical vessel supplying the he'll and therefore a 3 mm x 15 mm long expedition drug-eluting stent was then deployed at 18 atmospheres resulting in reduction of a 95% proximal posterior tibial artery stenosis to 0% residual. Following this predilatation was performed on the proximal left SFA with a 4 x 2 balloon, stenting with a 7 mm x 60 mm long smart nocturnal soft and extended with  postdilatation dilatation using a 6 mm x 40 mm long balloon resulting in reduction of an 70-80% segmental proximal left SFA stenosis to 0% residual. The patient tolerated the procedure well. The sheath was then withdrawn across the LAD bifurcation, secured and the  patient left the lab in stable condition.  Final impression: Successful angioscope PTA of the left anterior and posterior tibial vessels with stenting of the posterior tibialis with a drug-eluting stent, and stenting of the left SFA with a Nitinol self expanding stent. The patient was loaded with 300 mg of by mouth Plavix and given 4 baby aspirin. The sheath will be removed once ACT falls below 170 and pressure will be held on the groin to achieve hemostasis. The patient be hydrated overnight and discharged in the morning. We'll do followup Dopplers and we will see him back after that. Dr. Rosana Hoes'  nurse was notified of these results.  Lorretta Harp MD, [facility] 12/30/2011 2:24 PM

## 2011-12-30 NOTE — Progress Notes (Signed)
Site area: right groin  Site Prior to Removal:  Level 0  Pressure Applied For 30 MINUTES    Minutes Beginning at 1705  Manual:   yes  Patient Status During Pull:  Tolerated well.  Post Pull Groin Site:  Level 0  Post Pull Instructions Given:  yes  Post Pull Pulses Present:  yes  Dressing Applied:  yes  Comments:  No distress noted. No bruising, bleeding and/or hematoma present.

## 2011-12-30 NOTE — H&P (Signed)
  H & P will be scanned in.  Pt was reexamined and existing H & P reviewed. No changes found.  Lorretta Harp, MD [facility] 12/30/2011 12:49 PM

## 2011-12-31 ENCOUNTER — Other Ambulatory Visit (HOSPITAL_COMMUNITY): Payer: Self-pay | Admitting: Cardiology

## 2011-12-31 ENCOUNTER — Encounter (HOSPITAL_COMMUNITY): Payer: Self-pay | Admitting: Cardiology

## 2011-12-31 DIAGNOSIS — I739 Peripheral vascular disease, unspecified: Secondary | ICD-10-CM

## 2011-12-31 DIAGNOSIS — Z95820 Peripheral vascular angioplasty status with implants and grafts: Secondary | ICD-10-CM

## 2011-12-31 DIAGNOSIS — I70229 Atherosclerosis of native arteries of extremities with rest pain, unspecified extremity: Secondary | ICD-10-CM

## 2011-12-31 DIAGNOSIS — I998 Other disorder of circulatory system: Secondary | ICD-10-CM | POA: Diagnosis present

## 2011-12-31 HISTORY — DX: Peripheral vascular disease, unspecified: I73.9

## 2011-12-31 HISTORY — DX: Atherosclerosis of native arteries of extremities with rest pain, unspecified extremity: I70.229

## 2011-12-31 HISTORY — DX: Peripheral vascular angioplasty status with implants and grafts: Z95.820

## 2011-12-31 LAB — CBC
Hemoglobin: 9.6 g/dL — ABNORMAL LOW (ref 13.0–17.0)
Platelets: 303 10*3/uL (ref 150–400)
RBC: 3.45 MIL/uL — ABNORMAL LOW (ref 4.22–5.81)
WBC: 8.6 10*3/uL (ref 4.0–10.5)

## 2011-12-31 LAB — BASIC METABOLIC PANEL
Calcium: 9.4 mg/dL (ref 8.4–10.5)
GFR calc Af Amer: 83 mL/min — ABNORMAL LOW (ref >90)
GFR calc non Af Amer: 72 mL/min — ABNORMAL LOW (ref >90)
Potassium: 4.3 mEq/L (ref 3.5–5.1)
Sodium: 128 mEq/L — ABNORMAL LOW (ref 135–145)

## 2011-12-31 LAB — GLUCOSE, CAPILLARY: Glucose-Capillary: 343 mg/dL — ABNORMAL HIGH (ref 70–99)

## 2011-12-31 MED ORDER — CLOPIDOGREL BISULFATE 75 MG PO TABS: 75.0000 mg | ORAL_TABLET | Freq: Every day | ORAL | Status: DC

## 2011-12-31 MED ORDER — ASPIRIN 325 MG PO TBEC: 325.0000 mg | DELAYED_RELEASE_TABLET | Freq: Every day | ORAL | Status: DC

## 2011-12-31 NOTE — Progress Notes (Signed)
Subjective: Mr. Carpenito is a 51 year old single African American male father of 18, grandfather and 5 grandchildren referred to Dr. [name] by Dr. Jerline Pain at [facility] wound care center for per peripheral vascular dilation because of nonhealing left heel ulcers/critical limb ischemia. Risk factors include  20-pack-year history tobacco abuse having quit back in 2005, hypertension, hyperlipidemia and diabetes. Is complaining of left calf claudication with development of ulcer on his left foot.  Dopplers in the office November 26 showed a right ABI of 0.84 a left of 0.60. He presents now for angiography and potential for today's intervention for critical limb ischemia.   Objective: Vital signs in last 24 hours: Temp:  [97.9 F (36.6 C)-99.7 F (37.6 C)] 98.7 F (37.1 C) (12/27 0748) Pulse Rate:  [31-115] 99  (12/27 0748) Resp:  [15-24] 18  (12/27 0748) BP: (119-167)/(54-94) 131/62 mmHg (12/27 0748) SpO2:  [97 %-100 %] 99 % (12/27 0748) Weight:  [84.1 kg (185 lb 6.5 oz)-89.359 kg (197 lb)] 84.1 kg (185 lb 6.5 oz) (12/27 0050) Weight change:  Last BM Date: 12/29/11 Intake/Output from previous day: -735 12/26 0701 - 12/27 0700 In: 790 [P.O.:240; I.V.:550] Out: 1525 [Urine:1525] Intake/Output this shift: Total I/O In: -  Out: 300 [Urine:300]  PE: General: NAD No JVD Heart: RRR Lungs: clear Abd: soft; BS + R groin with hematoma Ext: R leg warm, good pulse distally    Lab Results:  Basename 12/31/11 0630 12/30/11 1049  WBC 8.6 11.0*  HGB 9.6* 10.9*  HCT 28.6* 32.4*  PLT 303 338   BMET  Basename 12/31/11 0630 12/30/11 1049  NA 128* 131*  K 4.3 4.1  CL 92* 93*  CO2 27 26  GLUCOSE 386* 270*  BUN 15 13  CREATININE 1.15 1.11  CALCIUM 9.4 9.8   No results found for this basename: TROPONINI:2,CK,MB:2 in the last 72 hours  Lab Results  Component Value Date   CHOL 206* 05/12/2011   HDL 40 05/12/2011   LDLCALC 146* 05/12/2011   TRIG 100 05/12/2011   CHOLHDL 5.2 05/12/2011   Lab  Results  Component Value Date   HGBA1C 10.6 12/08/2011     Lab Results  Component Value Date   TSH 0.816 04/16/2010   Studies/Results: PV angio  12/30/11: Successful angioscope PTA of the left anterior and posterior tibial vessels with stenting of the posterior tibialis with a drug-eluting stent, and stenting of the left SFA with a Nitinol self expanding stent  Medications: I have reviewed the patient's current medications.    [name] aspirin EC  325 mg Oral Daily  . clopidogrel  75 mg Oral Q breakfast  . gabapentin  300 mg Oral TID  . hydrALAZINE  10 mg Intravenous UD  . hydrochlorothiazide  25 mg Oral Daily  . insulin aspart  15 Units Subcutaneous TID AC  . insulin glargine  57 Units Subcutaneous QHS  . lisinopril  40 mg Oral Daily  . pantoprazole  40 mg Oral Daily  . simvastatin  40 mg Oral q1800  . sulfamethoxazole-trimethoprim  1 tablet Oral Daily   Assessment/Plan: Principal Problem:  *Critical lower limb ischemia, lt with ABI of 0.60 Active Problems:  Diabetic foot ulcer, lt.  Diabetes mellitus type 2, uncontrolled, with complications  HYPERLIPIDEMIA  HYPERTENSION  PVD (peripheral vascular disease)  S/P angioplasty with stent, 12/30/11, of Lt SFA, Post. tibialis and PTA of L. ant and post. tibial vessels  PLAN: out pt dopplers, home today follow up with Dr. [name]. Glucose is elevated--holding Metformin,  for 48 hours.    LOS: 1 day   INGOLD,LAURA R 12/31/2011, 9:56 AM    Patient seen and examined. Agree with assessment and plan.  Feels well. R leg is warm secondary to improved circulation. Will ambulate. DC today.   Troy Sine, MD, [facility] 12/31/2011 10:15 AM

## 2011-12-31 NOTE — Progress Notes (Signed)
DC instructions given to pt at this time re:  F/u appts, new prescription, meds to continue, s/s of any problems to notify MD about, cath site care, activity, and community resources.  Pt verbalized understanding of all instructions.  No s/s of any acute distress noted.

## 2012-01-03 NOTE — Discharge Summary (Signed)
Physician Discharge Summary  Patient ID: [name] MRN: [id number] DOB/AGE: [birth year] 51 y.o.  Admit date: 12/30/2011 Discharge date: 12/31/2011  Discharge Diagnoses:  Principal Problem:  *Critical lower limb ischemia, lt with ABI of 0.60 Active Problems:  Diabetic foot ulcer, lt.  Diabetes mellitus type 2, uncontrolled, with complications  HYPERLIPIDEMIA  HYPERTENSION  PVD (peripheral vascular disease)  S/P angioplasty with stent, 12/30/11, of Lt SFA, Post. tibialis and PTA of L. ant and post. tibial vessels   Discharged Condition: good  Procedures: PV arteriogram 12/30/11 by Dr. [name] with high-grade left SFA and tibial vessel disease with critical limb ischemia. Proceeded with endoscopic intervention of the left anterior posterior tib plus or minus stenting as well as stenting of the left SFA.   Hospital Course: Mr. Nova is a 51 year old single African American male referred to me by Dr. Jerline Pain at [facility] wound care center for per peripheral vascular dilation because of nonhealing left heel ulcers/critical limb ischemia. Risk factors include  20-pack-year history tobacco abuse having quit back in 2005, hypertension, hyperlipidemia and diabetes.He is complaining of left calf claudication with the right and last 2 months with development of ulcer on his left foot during that time. Dopplers in the office November 26 showed a right ABI of 0.84 a left of 0.60. He presented for angiography and potential for intervention for critical limb ischemia.   Results as above and underwent successful angioscope PTA of the left anterior and posterior tibial vessels with stenting of the posterior tibialis with a drug-eluting stent, and stenting of the left SFA with a Nitinol self expanding stent. The patient was loaded with 300 mg of by mouth Plavix and given 4 baby aspirin.  By the next am pt was stable, ambulated without complaints.  Plan for out patient dopplers and follow up with Dr. [name].   Will hold Metformin for 48 hours. Pt was seen and discharged by Dr. Claiborne Billings.   Consults: None  Significant Diagnostic Studies:  BMET    Component Value Date/Time   NA 128* 12/31/2011 0630   K 4.3 12/31/2011 0630   CL 92* 12/31/2011 0630   CO2 27 12/31/2011 0630   GLUCOSE 386* 12/31/2011 0630   BUN 15 12/31/2011 0630   CREATININE 1.15 12/31/2011 0630   CREATININE 1.13 04/30/2010 1502   CALCIUM 9.4 12/31/2011 0630   GFRNONAA 72* 12/31/2011 0630   GFRAA 83* 12/31/2011 0630    CBC    Component Value Date/Time   WBC 8.6 12/31/2011 0630   RBC 3.45* 12/31/2011 0630   HGB 9.6* 12/31/2011 0630   HCT 28.6* 12/31/2011 0630   PLT 303 12/31/2011 0630   MCV 82.9 12/31/2011 0630   MCH 27.8 12/31/2011 0630   MCHC 33.6 12/31/2011 0630   RDW 12.5 12/31/2011 0630   LYMPHSABS 1.7 04/16/2010 1232   MONOABS 0.3 04/16/2010 1232   EOSABS 0.1 04/16/2010 1232   BASOSABS 0.0 04/16/2010 1232       Discharge Exam: Blood pressure 131/62, pulse 99, temperature 98.7 F (37.1 C), temperature source Oral, resp. rate 18, height 6' (1.829 m), weight 84.1 kg (185 lb 6.5 oz), SpO2 99.00%.   PE: General: NAD  No JVD  Heart: RRR  Lungs: clear  Abd: soft; BS +  R groin with hematoma  Ext: R leg warm, good pulse distally     Disposition: 01-Home or Self Care  Discharge Orders    Future Appointments: Provider: Department: Dept Phone: Center:   01/07/2012 8:15 AM [place]  [facility] [phone] [facility]   01/25/2012 3:30 PM [facility] [place] [facility] [address] [id number] None   02/11/2012 8:00 AM [facility] [place] [facility]       Medication List     As of 01/03/2012 12:48 PM    TAKE these medications         aspirin 325 MG EC tablet   Take 1 tablet (325 mg total) by mouth daily.      clopidogrel 75 MG tablet   Commonly known as: PLAVIX   Take 1 tablet (75 mg total)  by mouth daily with breakfast.      gabapentin 300 MG capsule   Commonly known as: NEURONTIN   Take 1 capsule (300 mg total) by mouth 3 (three) times daily.      hydrochlorothiazide 25 MG tablet   Commonly known as: HYDRODIURIL   Take 1 tablet (25 mg total) by mouth daily.      insulin aspart 100 UNIT/ML injection   Commonly known as: novoLOG   Inject 15 Units into the skin 3 (three) times daily before meals.      insulin glargine 100 UNIT/ML injection   Commonly known as: LANTUS   Inject 57 Units into the skin at bedtime.      lisinopril 40 MG tablet   Commonly known as: PRINIVIL,ZESTRIL   Take 1 tablet (40 mg total) by mouth daily.      metFORMIN 1000 MG tablet   Commonly known as: GLUCOPHAGE   Take 1 tablet (1,000 mg total) by mouth 2 (two) times daily with a meal.      omeprazole 20 MG capsule   Commonly known as: PRILOSEC   Take 1 capsule (20 mg total) by mouth daily.      pravastatin 40 MG tablet   Commonly known as: PRAVACHOL   Take 40 mg by mouth daily.      sulfamethoxazole-trimethoprim 800-160 MG per tablet   Commonly known as: BACTRIM DS,SEPTRA DS   Take 1 tablet by mouth 2 (two) times daily. Prescribed by wound center      traMADol 50 MG tablet   Commonly known as: ULTRAM   Take 1 tablet (50 mg total) by mouth every 6 (six) hours as needed for pain.           Follow-up Information    Follow up with Lorretta Harp, MD. (the office will call with date and time for lower extremity dopplers)    Contact information:   [address] [place] [address] [phone]       Follow up with Lorretta Harp, MD. (the office will call with date and time)    Contact information:   [address] [place] [address] [phone]        Discharge Instructions: Call [facility] if any bleeding, swelling or drainage at cath site.  May shower, no tub baths for 48 hours for groin sticks.   No  lifting for 3 days over 5 pounds  No driving for 2 days  Heart Healthy Diabetic diet  DO NOT TAKE METFORMIN UNTIL 01/01/12, IT MAY INTERACT WITH THE CATH DYE  Signed: INGOLD,LAURA R 01/03/2012, 12:48 PM

## 2012-01-05 DIAGNOSIS — Z79899 Other long term (current) drug therapy: Secondary | ICD-10-CM

## 2012-01-05 DIAGNOSIS — I10 Essential (primary) hypertension: Secondary | ICD-10-CM | POA: Diagnosis present

## 2012-01-05 DIAGNOSIS — S98139A Complete traumatic amputation of one unspecified lesser toe, initial encounter: Secondary | ICD-10-CM

## 2012-01-05 DIAGNOSIS — L97509 Non-pressure chronic ulcer of other part of unspecified foot with unspecified severity: Secondary | ICD-10-CM | POA: Diagnosis present

## 2012-01-05 DIAGNOSIS — E1169 Type 2 diabetes mellitus with other specified complication: Secondary | ICD-10-CM | POA: Diagnosis present

## 2012-01-05 DIAGNOSIS — M869 Osteomyelitis, unspecified: Secondary | ICD-10-CM | POA: Diagnosis present

## 2012-01-05 DIAGNOSIS — Z87891 Personal history of nicotine dependence: Secondary | ICD-10-CM

## 2012-01-05 DIAGNOSIS — E1165 Type 2 diabetes mellitus with hyperglycemia: Secondary | ICD-10-CM | POA: Diagnosis present

## 2012-01-05 DIAGNOSIS — Z794 Long term (current) use of insulin: Secondary | ICD-10-CM

## 2012-01-05 DIAGNOSIS — M9080 Osteopathy in diseases classified elsewhere, unspecified site: Secondary | ICD-10-CM | POA: Diagnosis present

## 2012-01-05 DIAGNOSIS — E785 Hyperlipidemia, unspecified: Secondary | ICD-10-CM | POA: Diagnosis present

## 2012-01-05 DIAGNOSIS — IMO0002 Reserved for concepts with insufficient information to code with codable children: Secondary | ICD-10-CM | POA: Diagnosis present

## 2012-01-05 DIAGNOSIS — D649 Anemia, unspecified: Secondary | ICD-10-CM | POA: Diagnosis present

## 2012-01-05 DIAGNOSIS — I872 Venous insufficiency (chronic) (peripheral): Secondary | ICD-10-CM | POA: Diagnosis present

## 2012-01-05 DIAGNOSIS — Z7982 Long term (current) use of aspirin: Secondary | ICD-10-CM

## 2012-01-05 DIAGNOSIS — E1142 Type 2 diabetes mellitus with diabetic polyneuropathy: Secondary | ICD-10-CM | POA: Diagnosis present

## 2012-01-05 DIAGNOSIS — E1149 Type 2 diabetes mellitus with other diabetic neurological complication: Principal | ICD-10-CM | POA: Diagnosis present

## 2012-01-05 DIAGNOSIS — E871 Hypo-osmolality and hyponatremia: Secondary | ICD-10-CM | POA: Diagnosis present

## 2012-01-05 DIAGNOSIS — S98119A Complete traumatic amputation of unspecified great toe, initial encounter: Secondary | ICD-10-CM

## 2012-01-05 DIAGNOSIS — I739 Peripheral vascular disease, unspecified: Secondary | ICD-10-CM | POA: Diagnosis present

## 2012-01-05 NOTE — ED Notes (Signed)
Patient has known wound infection to left 3rd metatarsal and is being followed by [facility]. Had vascular surgery to left leg 12/30/11. Presents with left leg pain radiating up to hip that's not relieved by pain medications. Denies any fevers. Endorses chills, wound drainage and odor. Patient has been taking Bactrim for over 1 month.

## 2012-01-06 ENCOUNTER — Inpatient Hospital Stay (HOSPITAL_COMMUNITY)
Admission: EM | Admit: 2012-01-06 | Discharge: 2012-01-10 | DRG: 041 | Disposition: A | Discharge status: Home / Self Care | Payer: Medicaid Other | Attending: Internal Medicine | Admitting: Internal Medicine

## 2012-01-06 ENCOUNTER — Encounter (HOSPITAL_COMMUNITY): Payer: Self-pay

## 2012-01-06 ENCOUNTER — Emergency Department (HOSPITAL_COMMUNITY): Payer: Medicaid Other

## 2012-01-06 ENCOUNTER — Observation Stay (HOSPITAL_COMMUNITY): Payer: Medicaid Other

## 2012-01-06 DIAGNOSIS — E1165 Type 2 diabetes mellitus with hyperglycemia: Secondary | ICD-10-CM | POA: Diagnosis present

## 2012-01-06 DIAGNOSIS — E871 Hypo-osmolality and hyponatremia: Secondary | ICD-10-CM

## 2012-01-06 DIAGNOSIS — R6889 Other general symptoms and signs: Secondary | ICD-10-CM

## 2012-01-06 DIAGNOSIS — R059 Cough, unspecified: Secondary | ICD-10-CM

## 2012-01-06 DIAGNOSIS — I998 Other disorder of circulatory system: Secondary | ICD-10-CM

## 2012-01-06 DIAGNOSIS — Z95820 Peripheral vascular angioplasty status with implants and grafts: Secondary | ICD-10-CM

## 2012-01-06 DIAGNOSIS — M869 Osteomyelitis, unspecified: Secondary | ICD-10-CM

## 2012-01-06 DIAGNOSIS — I70229 Atherosclerosis of native arteries of extremities with rest pain, unspecified extremity: Secondary | ICD-10-CM

## 2012-01-06 DIAGNOSIS — E11621 Type 2 diabetes mellitus with foot ulcer: Secondary | ICD-10-CM

## 2012-01-06 DIAGNOSIS — I739 Peripheral vascular disease, unspecified: Secondary | ICD-10-CM

## 2012-01-06 DIAGNOSIS — IMO0002 Reserved for concepts with insufficient information to code with codable children: Secondary | ICD-10-CM

## 2012-01-06 DIAGNOSIS — R37 Sexual dysfunction, unspecified: Secondary | ICD-10-CM

## 2012-01-06 DIAGNOSIS — I10 Essential (primary) hypertension: Secondary | ICD-10-CM

## 2012-01-06 DIAGNOSIS — E785 Hyperlipidemia, unspecified: Secondary | ICD-10-CM

## 2012-01-06 DIAGNOSIS — L97509 Non-pressure chronic ulcer of other part of unspecified foot with unspecified severity: Secondary | ICD-10-CM

## 2012-01-06 DIAGNOSIS — R05 Cough: Secondary | ICD-10-CM

## 2012-01-06 DIAGNOSIS — E1169 Type 2 diabetes mellitus with other specified complication: Secondary | ICD-10-CM

## 2012-01-06 DIAGNOSIS — E118 Type 2 diabetes mellitus with unspecified complications: Secondary | ICD-10-CM

## 2012-01-06 DIAGNOSIS — E119 Type 2 diabetes mellitus without complications: Secondary | ICD-10-CM

## 2012-01-06 LAB — CBC WITH DIFFERENTIAL/PLATELET
Basophils Absolute: 0 10*3/uL (ref 0.0–0.1)
Eosinophils Absolute: 0.1 10*3/uL (ref 0.0–0.7)
Eosinophils Relative: 1 % (ref 0–5)
HCT: 28.1 % — ABNORMAL LOW (ref 39.0–52.0)
Lymphocytes Relative: 9 % — ABNORMAL LOW (ref 12–46)
Lymphs Abs: 0.8 10*3/uL (ref 0.7–4.0)
MCH: 27.9 pg (ref 26.0–34.0)
MCV: 83.4 fL (ref 78.0–100.0)
Monocytes Absolute: 0.6 10*3/uL (ref 0.1–1.0)
Platelets: 347 10*3/uL (ref 150–400)
RDW: 12.6 % (ref 11.5–15.5)

## 2012-01-06 LAB — CBC
MCH: 27 pg (ref 26.0–34.0)
MCHC: 32.5 g/dL (ref 30.0–36.0)
Platelets: 353 10*3/uL (ref 150–400)
RDW: 12.6 % (ref 11.5–15.5)

## 2012-01-06 LAB — GLUCOSE, CAPILLARY
Glucose-Capillary: 160 mg/dL — ABNORMAL HIGH (ref 70–99)
Glucose-Capillary: 138 mg/dL — ABNORMAL HIGH (ref 70–99)

## 2012-01-06 LAB — BASIC METABOLIC PANEL
CO2: 25 mEq/L (ref 19–32)
Calcium: 9 mg/dL (ref 8.4–10.5)
Chloride: 93 mEq/L — ABNORMAL LOW (ref 96–112)
Creatinine, Ser: 1.17 mg/dL (ref 0.50–1.35)
Glucose, Bld: 328 mg/dL — ABNORMAL HIGH (ref 70–99)

## 2012-01-06 LAB — C-REACTIVE PROTEIN: CRP: 14 mg/dL — ABNORMAL HIGH (ref <0.60)

## 2012-01-06 LAB — CREATININE, SERUM
Creatinine, Ser: 0.91 mg/dL (ref 0.50–1.35)
GFR calc non Af Amer: >90 mL/min (ref >90)

## 2012-01-06 LAB — SEDIMENTATION RATE: Sed Rate: 131 mm/hr — ABNORMAL HIGH (ref 0–16)

## 2012-01-06 MED ORDER — ALUM & MAG HYDROXIDE-SIMETH 200-200-20 MG/5ML PO SUSP
30.0000 mL | Freq: Four times a day (QID) | ORAL | Status: DC | PRN
  Filled 2012-01-06: qty 30

## 2012-01-06 MED ORDER — ENOXAPARIN SODIUM 40 MG/0.4ML ~~LOC~~ SOLN
40.0000 mg | SUBCUTANEOUS | Status: DC
Start: 2012-01-06 — End: 2012-01-10
  Administered 2012-01-06 – 2012-01-10 (×4): 40 mg via SUBCUTANEOUS
  Filled 2012-01-06 (×6): qty 0.4

## 2012-01-06 MED ORDER — ONDANSETRON HCL 4 MG PO TABS: 4.0000 mg | ORAL_TABLET | Freq: Four times a day (QID) | ORAL | Status: DC | PRN

## 2012-01-06 MED ORDER — MORPHINE SULFATE 2 MG/ML IJ SOLN
2.0000 mg | INTRAMUSCULAR | Status: DC | PRN
  Administered 2012-01-06 – 2012-01-07 (×3): 2 mg via INTRAVENOUS
  Filled 2012-01-06 (×3): qty 1

## 2012-01-06 MED ORDER — CLOPIDOGREL BISULFATE 75 MG PO TABS
75.0000 mg | ORAL_TABLET | Freq: Every day | ORAL | Status: DC
  Administered 2012-01-07 – 2012-01-10 (×4): 75 mg via ORAL
  Filled 2012-01-06 (×5): qty 1

## 2012-01-06 MED ORDER — VANCOMYCIN HCL 10 G IV SOLR
1500.0000 mg | Freq: Once | INTRAVENOUS | Status: AC
  Administered 2012-01-06: 1500 mg via INTRAVENOUS
  Filled 2012-01-06: qty 1500

## 2012-01-06 MED ORDER — METFORMIN HCL 500 MG PO TABS
1000.0000 mg | ORAL_TABLET | Freq: Two times a day (BID) | ORAL | Status: DC
  Administered 2012-01-09 – 2012-01-10 (×4): 1000 mg via ORAL
  Filled 2012-01-06 (×10): qty 2

## 2012-01-06 MED ORDER — INSULIN GLARGINE 100 UNIT/ML ~~LOC~~ SOLN: 57.0000 [IU] | Freq: Every day | SUBCUTANEOUS | Status: DC

## 2012-01-06 MED ORDER — PIPERACILLIN-TAZOBACTAM 3.375 G IVPB 30 MIN
3.3750 g | Freq: Once | INTRAVENOUS | Status: AC
  Administered 2012-01-06: 3.375 g via INTRAVENOUS
  Filled 2012-01-06: qty 50

## 2012-01-06 MED ORDER — SENNOSIDES-DOCUSATE SODIUM 8.6-50 MG PO TABS
1.0000 | ORAL_TABLET | Freq: Every evening | ORAL | Status: DC | PRN
  Filled 2012-01-06: qty 1

## 2012-01-06 MED ORDER — LISINOPRIL 40 MG PO TABS
40.0000 mg | ORAL_TABLET | Freq: Every day | ORAL | Status: DC
  Administered 2012-01-06 – 2012-01-10 (×5): 40 mg via ORAL
  Filled 2012-01-06 (×5): qty 1

## 2012-01-06 MED ORDER — PANTOPRAZOLE SODIUM 40 MG PO TBEC
40.0000 mg | DELAYED_RELEASE_TABLET | Freq: Every day | ORAL | Status: DC
  Administered 2012-01-06 – 2012-01-10 (×5): 40 mg via ORAL
  Filled 2012-01-06 (×4): qty 1

## 2012-01-06 MED ORDER — INSULIN GLARGINE 100 UNIT/ML ~~LOC~~ SOLN
57.0000 [IU] | Freq: Every day | SUBCUTANEOUS | Status: DC
  Administered 2012-01-06 – 2012-01-09 (×3): 57 [IU] via SUBCUTANEOUS

## 2012-01-06 MED ORDER — GABAPENTIN 300 MG PO CAPS
300.0000 mg | ORAL_CAPSULE | Freq: Three times a day (TID) | ORAL | Status: DC
  Administered 2012-01-06 – 2012-01-09 (×10): 300 mg via ORAL
  Filled 2012-01-06 (×12): qty 1

## 2012-01-06 MED ORDER — ASPIRIN EC 325 MG PO TBEC
325.0000 mg | DELAYED_RELEASE_TABLET | Freq: Every day | ORAL | Status: DC
  Administered 2012-01-06 – 2012-01-10 (×5): 325 mg via ORAL
  Filled 2012-01-06 (×5): qty 1

## 2012-01-06 MED ORDER — INSULIN ASPART 100 UNIT/ML ~~LOC~~ SOLN
0.0000 [IU] | Freq: Three times a day (TID) | SUBCUTANEOUS | Status: DC
  Administered 2012-01-06: 15 [IU] via SUBCUTANEOUS
  Administered 2012-01-06: 3 [IU] via SUBCUTANEOUS
  Administered 2012-01-07: 5 [IU] via SUBCUTANEOUS
  Administered 2012-01-08: 11 [IU] via SUBCUTANEOUS
  Administered 2012-01-08: 2 [IU] via SUBCUTANEOUS
  Administered 2012-01-08: 3 [IU] via SUBCUTANEOUS
  Administered 2012-01-09: 11 [IU] via SUBCUTANEOUS
  Administered 2012-01-09: 5 [IU] via SUBCUTANEOUS
  Administered 2012-01-09: 8 [IU] via SUBCUTANEOUS
  Administered 2012-01-10: 11 [IU] via SUBCUTANEOUS
  Administered 2012-01-10: 3 [IU] via SUBCUTANEOUS
  Administered 2012-01-10: 2 [IU] via SUBCUTANEOUS

## 2012-01-06 MED ORDER — VANCOMYCIN HCL IN DEXTROSE 1-5 GM/200ML-% IV SOLN
1000.0000 mg | Freq: Three times a day (TID) | INTRAVENOUS | Status: DC
  Administered 2012-01-06 – 2012-01-09 (×11): 1000 mg via INTRAVENOUS
  Filled 2012-01-06 (×14): qty 200

## 2012-01-06 MED ORDER — ONDANSETRON HCL 4 MG/2ML IJ SOLN
4.0000 mg | Freq: Once | INTRAMUSCULAR | Status: AC
  Administered 2012-01-06: 4 mg via INTRAVENOUS
  Filled 2012-01-06: qty 2

## 2012-01-06 MED ORDER — GADOBENATE DIMEGLUMINE 529 MG/ML IV SOLN
17.0000 mL | Freq: Once | INTRAVENOUS | Status: AC
  Administered 2012-01-06: 17 mL via INTRAVENOUS

## 2012-01-06 MED ORDER — INSULIN ASPART 100 UNIT/ML ~~LOC~~ SOLN
0.0000 [IU] | Freq: Every day | SUBCUTANEOUS | Status: DC
  Administered 2012-01-06 – 2012-01-07 (×2): 5 [IU] via SUBCUTANEOUS

## 2012-01-06 MED ORDER — SODIUM CHLORIDE 0.9 % IJ SOLN: 3.0000 mL | INTRAMUSCULAR | Status: DC | PRN

## 2012-01-06 MED ORDER — ACETAMINOPHEN 325 MG PO TABS: 650.0000 mg | ORAL_TABLET | Freq: Four times a day (QID) | ORAL | Status: DC | PRN

## 2012-01-06 MED ORDER — ACETAMINOPHEN 650 MG RE SUPP: 650.0000 mg | Freq: Four times a day (QID) | RECTAL | Status: DC | PRN

## 2012-01-06 MED ORDER — INSULIN ASPART 100 UNIT/ML ~~LOC~~ SOLN
15.0000 [IU] | Freq: Three times a day (TID) | SUBCUTANEOUS | Status: DC
  Administered 2012-01-06 – 2012-01-09 (×9): 15 [IU] via SUBCUTANEOUS

## 2012-01-06 MED ORDER — HYDROCODONE-ACETAMINOPHEN 5-325 MG PO TABS
1.0000 | ORAL_TABLET | ORAL | Status: DC | PRN
  Administered 2012-01-06 – 2012-01-10 (×8): 2 via ORAL
  Filled 2012-01-06 (×9): qty 2

## 2012-01-06 MED ORDER — SODIUM CHLORIDE 0.9 % IV BOLUS (SEPSIS)
1000.0000 mL | Freq: Once | INTRAVENOUS | Status: AC
  Administered 2012-01-06: 1000 mL via INTRAVENOUS

## 2012-01-06 MED ORDER — PIPERACILLIN-TAZOBACTAM 3.375 G IVPB
3.3750 g | Freq: Three times a day (TID) | INTRAVENOUS | Status: DC
  Administered 2012-01-06 – 2012-01-10 (×12): 3.375 g via INTRAVENOUS
  Filled 2012-01-06 (×16): qty 50

## 2012-01-06 MED ORDER — SIMVASTATIN 20 MG PO TABS
20.0000 mg | ORAL_TABLET | Freq: Every day | ORAL | Status: DC
  Administered 2012-01-06 – 2012-01-10 (×5): 20 mg via ORAL
  Filled 2012-01-06 (×5): qty 1

## 2012-01-06 MED ORDER — MORPHINE SULFATE 4 MG/ML IJ SOLN
6.0000 mg | Freq: Once | INTRAMUSCULAR | Status: AC
  Administered 2012-01-06: 6 mg via INTRAVENOUS
  Filled 2012-01-06: qty 2

## 2012-01-06 MED ORDER — ONDANSETRON HCL 4 MG/2ML IJ SOLN
4.0000 mg | Freq: Four times a day (QID) | INTRAMUSCULAR | Status: DC | PRN
  Administered 2012-01-06: 4 mg via INTRAVENOUS
  Filled 2012-01-06: qty 2

## 2012-01-06 NOTE — Progress Notes (Addendum)
S:  Patient was admitted this morning with increasing purulence, odor, and pain from his left foot ulcer.  He was started on broad spectrum antibiotics and an MRI was ordered but has not yet been completed. The patient states that he has had likely osteomyelitis since a month ago and review of the images in EPIC demonstrates an XR from 12/04/11 which was concerning for osteo in the 2nd metatarsal head.  He has undergone treatment with bactrim which did not help and revascularization by Dr. [name] which also has not helped.  The patient states that the possibility of amputation has been broached before.  He has had several other digits amputated by Dr. Sharol Given in the past.  He has had some rigors and chills, nausea and vomiting.  Denies diarrhea, constipation.  Pain in his left leg has improved since getting pain medication in the ER.    O:  VSS Left foot:  8cm ulcer with foul odor and purulent drainage that extends to bone.    A/P:  I added on a CRP and ESR this morning which are both elevated.  Exam and X-ray are both concerning for osteo.  MRI is still pending and is likely not going to happen early today.   -  Ortho consult -  Continue antibiotics   Diabetes:  Elevated fingersticks likely due to stress and infectino -  Continue glargine but change to NOW and daily dosing -  Continue SSI 0-15, may need to increase and add meal coverage  Blood pressure:  Mildly elevated likely due to acute illness.   -  Trend for now

## 2012-01-06 NOTE — Consult Note (Signed)
Wound consult requested for left foot.  X-ray indicates osteomyelitis. This complex medical problem is beyond [facility] scope of practice.  Please obtain ortho consult for further assessment and plan of care.   Will not plan to follow further unless re-consulted.  [address], [place], MSN, [place]

## 2012-01-06 NOTE — ED Notes (Signed)
Pt back from x-ray.
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
## 2012-01-06 NOTE — ED Notes (Signed)
Pt transported to Xray. 
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
## 2012-01-06 NOTE — ED Provider Notes (Signed)
Medical screening examination/treatment/procedure(s) were conducted as a shared visit with non-physician practitioner(s) and myself.  I personally evaluated the patient during the encounter  Pt seen/examined, he has obvious infection to foot, no crepitance noted.  Distal pulses intact.  Will need admission for antibiotics  Sharyon Cable, MD 01/06/12 [phone]

## 2012-01-06 NOTE — Progress Notes (Addendum)
ANTIBIOTIC CONSULT NOTE - INITIAL  Pharmacy Consult for Vancomycin Indication: Infected left foot  No Known Allergies  Patient Measurements: Height: 6' 0.05" (183 cm) Weight: 185 lb 3 oz (84 kg) IBW/kg (Calculated) : 77.71    Vital Signs: Temp: 98.1 F (36.7 C) (01/02 0010) Temp src: Oral (01/02 0010) BP: 107/63 mmHg (01/02 0010) Pulse Rate: 107  (01/02 0010)  Labs:  Basename 01/06/12 0155  WBC 8.5  HGB 9.4*  PLT 347  LABCREA --  CREATININE 1.17   Estimated Creatinine Clearance: 82.1 ml/min (by C-G formula based on Cr of 1.17). No results found for this basename: VANCOTROUGH:2,VANCOPEAK:2,VANCORANDOM:2,GENTTROUGH:2,GENTPEAK:2,GENTRANDOM:2,TOBRATROUGH:2,TOBRAPEAK:2,TOBRARND:2,AMIKACINPEAK:2,AMIKACINTROU:2,AMIKACIN:2, in the last 72 hours   Microbiology: No results found for this or any previous visit (from the past 720 hour(s)).  Medical History: Past Medical History  Diagnosis Date  . Hyperlipidemia   . Hypertension   . Osteomyelitis     left great toe, s/p amputation  . Osteomyelitis of ankle or foot 05/2011    rt foot  . Neuromuscular disorder     diabetic neruopathy  . PAD (peripheral artery disease)   . Pneumonia 2010  . Type II diabetes mellitus ~ 2002  . Critical lower limb ischemia, lt with ABI of 0.60 12/31/2011  . PVD (peripheral vascular disease) 12/31/2011  . S/P angioplasty with stent, 12/30/11, of Lt SFA, Post. tibialis and PTA of L. ant and post. tibial vessels 12/31/2011    Medications:  ASA  Plavix  Neurontin  Hctz  Novlolog  Lantus  Zestril  Metformin  Prilosec  Pravachol  Bactrim  Tramadol  Assessment: 52 yo male with foot infection, possible osteomyelitis, for empiric antibiotics  Goal of Therapy:  Vancomycin trough level 15-20 mcg/ml  Plan:  Vancomcyin 1500 mg IV now, then 1 g IV q8h  Abbott, Bronson Curb 01/06/2012,4:31 AM   Addendum: Antibiotic coverage being broadened with Zosyn - pharmacy to dose. Patient receiving Zosyn  3.375g in ED ~0430 - CrCl 82 ml/min  Plan: 1. Zosyn 3.375g IV q8h - infuse over 4 hours - next dose @ [place], PharmD Clinical Pharmacist [phone] 01/06/2012, 8:33 AM

## 2012-01-06 NOTE — ED Notes (Addendum)
Pt presents to ED with infection to left foot; pt states he had stents placed in his foot 12/26 due to occlusion; pt states he has not been able to stand on his feet since then; when he does 'it hurts so bad I can't walk.  i also noticed a smell and know it is infected. It smells like when I lost my toe." Yellow discharge noted to ulcer; bandage on foot also saturated; odor noted as well; pulse intact

## 2012-01-07 ENCOUNTER — Encounter (HOSPITAL_COMMUNITY): Payer: Self-pay | Admitting: Anesthesiology

## 2012-01-07 ENCOUNTER — Encounter (HOSPITAL_COMMUNITY)
Admission: EM | Disposition: A | Discharge status: Home / Self Care | Payer: Self-pay | Source: Home / Self Care | Attending: Internal Medicine

## 2012-01-07 ENCOUNTER — Encounter (HOSPITAL_BASED_OUTPATIENT_CLINIC_OR_DEPARTMENT_OTHER): Payer: BC Managed Care – PPO | Attending: General Surgery

## 2012-01-07 ENCOUNTER — Observation Stay (HOSPITAL_COMMUNITY): Payer: Medicaid Other | Admitting: Anesthesiology

## 2012-01-07 DIAGNOSIS — M869 Osteomyelitis, unspecified: Secondary | ICD-10-CM

## 2012-01-07 DIAGNOSIS — E1165 Type 2 diabetes mellitus with hyperglycemia: Secondary | ICD-10-CM

## 2012-01-07 HISTORY — PX: AMPUTATION: SHX166

## 2012-01-07 LAB — GLUCOSE, CAPILLARY
Glucose-Capillary: 234 mg/dL — ABNORMAL HIGH (ref 70–99)
Glucose-Capillary: 127 mg/dL — ABNORMAL HIGH (ref 70–99)
Glucose-Capillary: 235 mg/dL — ABNORMAL HIGH (ref 70–99)

## 2012-01-07 LAB — CBC
HCT: 31.5 % — ABNORMAL LOW (ref 39.0–52.0)
Hemoglobin: 10.2 g/dL — ABNORMAL LOW (ref 13.0–17.0)
MCH: 27.1 pg (ref 26.0–34.0)
MCHC: 32.4 g/dL (ref 30.0–36.0)
RBC: 3.76 MIL/uL — ABNORMAL LOW (ref 4.22–5.81)

## 2012-01-07 LAB — BASIC METABOLIC PANEL
BUN: 15 mg/dL (ref 6–23)
Chloride: 95 mEq/L — ABNORMAL LOW (ref 96–112)
GFR calc non Af Amer: 75 mL/min — ABNORMAL LOW (ref >90)
Glucose, Bld: 179 mg/dL — ABNORMAL HIGH (ref 70–99)
Potassium: 4.2 mEq/L (ref 3.5–5.1)
Sodium: 135 mEq/L (ref 135–145)

## 2012-01-07 SURGERY — AMPUTATION, FOOT, PARTIAL
Anesthesia: General | Site: Foot | Laterality: Left | Wound class: Dirty or Infected

## 2012-01-07 MED ORDER — OXYCODONE HCL 5 MG PO TABS: 5.0000 mg | ORAL_TABLET | Freq: Once | ORAL | Status: AC | PRN

## 2012-01-07 MED ORDER — ONDANSETRON HCL 4 MG/2ML IJ SOLN: 4.0000 mg | Freq: Four times a day (QID) | INTRAMUSCULAR | Status: DC | PRN

## 2012-01-07 MED ORDER — PROPOFOL 10 MG/ML IV BOLUS
INTRAVENOUS | Status: DC | PRN
  Administered 2012-01-07: 200 mg via INTRAVENOUS

## 2012-01-07 MED ORDER — CHLORHEXIDINE GLUCONATE 4 % EX LIQD
60.0000 mL | Freq: Once | CUTANEOUS | Status: AC
  Administered 2012-01-07: 4 via TOPICAL
  Filled 2012-01-07: qty 60

## 2012-01-07 MED ORDER — SODIUM CHLORIDE 0.9 % IR SOLN
Status: DC | PRN
  Administered 2012-01-07: 1000 mL

## 2012-01-07 MED ORDER — METOCLOPRAMIDE HCL 5 MG PO TABS
5.0000 mg | ORAL_TABLET | Freq: Three times a day (TID) | ORAL | Status: DC | PRN
  Filled 2012-01-07: qty 2

## 2012-01-07 MED ORDER — WARFARIN - PHARMACIST DOSING INPATIENT: Freq: Every day | Status: DC

## 2012-01-07 MED ORDER — MEPERIDINE HCL 25 MG/ML IJ SOLN: 6.2500 mg | INTRAMUSCULAR | Status: DC | PRN

## 2012-01-07 MED ORDER — WARFARIN SODIUM 7.5 MG PO TABS
7.5000 mg | ORAL_TABLET | Freq: Once | ORAL | Status: AC
  Administered 2012-01-07: 7.5 mg via ORAL
  Filled 2012-01-07: qty 1

## 2012-01-07 MED ORDER — ONDANSETRON HCL 4 MG PO TABS: 4.0000 mg | ORAL_TABLET | Freq: Four times a day (QID) | ORAL | Status: DC | PRN

## 2012-01-07 MED ORDER — SODIUM CHLORIDE 0.9 % IV SOLN
INTRAVENOUS | Status: DC | PRN
  Administered 2012-01-07: 18:00:00 via INTRAVENOUS

## 2012-01-07 MED ORDER — HYDROMORPHONE HCL PF 1 MG/ML IJ SOLN
0.5000 mg | INTRAMUSCULAR | Status: DC | PRN
  Administered 2012-01-08 – 2012-01-10 (×6): 1 mg via INTRAVENOUS
  Filled 2012-01-07 (×5): qty 1

## 2012-01-07 MED ORDER — HYDROMORPHONE HCL PF 1 MG/ML IJ SOLN
0.2500 mg | INTRAMUSCULAR | Status: DC | PRN
  Filled 2012-01-07: qty 1

## 2012-01-07 MED ORDER — METOCLOPRAMIDE HCL 5 MG/ML IJ SOLN
5.0000 mg | Freq: Three times a day (TID) | INTRAMUSCULAR | Status: DC | PRN
  Filled 2012-01-07: qty 2

## 2012-01-07 MED ORDER — COUMADIN BOOK
Freq: Once | Status: AC
  Administered 2012-01-07: 23:00:00
  Filled 2012-01-07: qty 1

## 2012-01-07 MED ORDER — OXYCODONE HCL 5 MG/5ML PO SOLN: 5.0000 mg | Freq: Once | ORAL | Status: AC | PRN

## 2012-01-07 MED ORDER — OXYCODONE-ACETAMINOPHEN 5-325 MG PO TABS
1.0000 | ORAL_TABLET | ORAL | Status: DC | PRN
  Administered 2012-01-08: 2 via ORAL
  Filled 2012-01-07: qty 2

## 2012-01-07 MED ORDER — WARFARIN VIDEO: Freq: Once | Status: DC

## 2012-01-07 MED ORDER — FENTANYL CITRATE 0.05 MG/ML IJ SOLN
INTRAMUSCULAR | Status: DC | PRN
  Administered 2012-01-07: 100 ug via INTRAVENOUS

## 2012-01-07 MED ORDER — ONDANSETRON HCL 4 MG/2ML IJ SOLN: 4.0000 mg | Freq: Once | INTRAMUSCULAR | Status: AC | PRN

## 2012-01-07 MED ORDER — MIDAZOLAM HCL 5 MG/5ML IJ SOLN
INTRAMUSCULAR | Status: DC | PRN
  Administered 2012-01-07: 2 mg via INTRAVENOUS

## 2012-01-07 MED ORDER — LIDOCAINE HCL (CARDIAC) 20 MG/ML IV SOLN
INTRAVENOUS | Status: DC | PRN
  Administered 2012-01-07: 50 mg via INTRAVENOUS

## 2012-01-07 MED ORDER — SODIUM CHLORIDE 0.9 % IV SOLN
Freq: Once | INTRAVENOUS | Status: AC
  Administered 2012-01-07: 10:00:00 via INTRAVENOUS

## 2012-01-07 MED ORDER — LACTATED RINGERS IV SOLN
INTRAVENOUS | Status: DC
  Administered 2012-01-07: 16:00:00 via INTRAVENOUS

## 2012-01-07 MED ORDER — LACTATED RINGERS IV SOLN
INTRAVENOUS | Status: DC | PRN
  Administered 2012-01-07 (×2): via INTRAVENOUS

## 2012-01-07 SURGICAL SUPPLY — 38 items
BANDAGE GAUZE ELAST BULKY 4 IN (GAUZE/BANDAGES/DRESSINGS) ×2 IMPLANT
BLADE SAW SGTL HD 18.5X60.5X1. (BLADE) ×2 IMPLANT
BLADE SURG 10 STRL SS (BLADE) IMPLANT
BNDG COHESIVE 4X5 TAN STRL (GAUZE/BANDAGES/DRESSINGS) ×2 IMPLANT
CLOTH BEACON ORANGE TIMEOUT ST (SAFETY) ×2 IMPLANT
COVER SURGICAL LIGHT HANDLE (MISCELLANEOUS) ×2 IMPLANT
CUFF TOURNIQUET SINGLE 34IN LL (TOURNIQUET CUFF) IMPLANT
CUFF TOURNIQUET SINGLE 44IN (TOURNIQUET CUFF) IMPLANT
DRAPE U-SHAPE 47X51 STRL (DRAPES) ×2 IMPLANT
DRSG ADAPTIC 3X8 NADH LF (GAUZE/BANDAGES/DRESSINGS) ×2 IMPLANT
DRSG PAD ABDOMINAL 8X10 ST (GAUZE/BANDAGES/DRESSINGS) ×2 IMPLANT
DURAPREP 26ML APPLICATOR (WOUND CARE) ×2 IMPLANT
ELECT REM PT RETURN 9FT ADLT (ELECTROSURGICAL) ×2
ELECTRODE REM PT RTRN 9FT ADLT (ELECTROSURGICAL) ×1 IMPLANT
GLOVE BIOGEL PI IND STRL 9 (GLOVE) ×1 IMPLANT
GLOVE BIOGEL PI INDICATOR 9 (GLOVE) ×1
GLOVE SURG ORTHO 9.0 STRL STRW (GLOVE) ×2 IMPLANT
GOWN PREVENTION PLUS XLARGE (GOWN DISPOSABLE) ×2 IMPLANT
GOWN SRG XL XLNG 56XLVL 4 (GOWN DISPOSABLE) ×2 IMPLANT
GOWN STRL NON-REIN XL XLG LVL4 (GOWN DISPOSABLE) ×2
KIT BASIN OR (CUSTOM PROCEDURE TRAY) ×2 IMPLANT
KIT ROOM TURNOVER OR (KITS) ×2 IMPLANT
MANIFOLD NEPTUNE II (INSTRUMENTS) IMPLANT
NS IRRIG 1000ML POUR BTL (IV SOLUTION) ×2 IMPLANT
PACK ORTHO EXTREMITY (CUSTOM PROCEDURE TRAY) ×2 IMPLANT
PAD ARMBOARD 7.5X6 YLW CONV (MISCELLANEOUS) ×4 IMPLANT
PAD CAST 4YDX4 CTTN HI CHSV (CAST SUPPLIES) IMPLANT
PADDING CAST COTTON 4X4 STRL (CAST SUPPLIES)
SPONGE GAUZE 4X4 12PLY (GAUZE/BANDAGES/DRESSINGS) ×2 IMPLANT
SPONGE LAP 18X18 X RAY DECT (DISPOSABLE) ×2 IMPLANT
STAPLER VISISTAT 35W (STAPLE) ×2 IMPLANT
SUT ETHILON 2 0 PSLX (SUTURE) ×4 IMPLANT
SUT VIC AB 2-0 CTB1 (SUTURE) ×6 IMPLANT
TOWEL OR 17X24 6PK STRL BLUE (TOWEL DISPOSABLE) ×2 IMPLANT
TOWEL OR 17X26 10 PK STRL BLUE (TOWEL DISPOSABLE) ×2 IMPLANT
TUBE CONNECTING 12X1/4 (SUCTIONS) ×2 IMPLANT
WATER STERILE IRR 1000ML POUR (IV SOLUTION) ×2 IMPLANT
YANKAUER SUCT BULB TIP NO VENT (SUCTIONS) ×2 IMPLANT

## 2012-01-07 NOTE — Anesthesia Procedure Notes (Signed)
Procedure Name: LMA Insertion Date/Time: 01/07/2012 5:11 PM Performed by: Eligha Bridegroom Pre-anesthesia Checklist: Patient identified, Timeout performed, Emergency Drugs available, Suction available and Patient being monitored Patient Re-evaluated:Patient Re-evaluated prior to inductionOxygen Delivery Method: Circle system utilized Preoxygenation: Pre-oxygenation with 100% oxygen Intubation Type: IV induction LMA: LMA inserted LMA Size: 5.0 Number of attempts: 1 Tube secured with: Tape Dental Injury: Teeth and Oropharynx as per pre-operative assessment

## 2012-01-07 NOTE — Progress Notes (Signed)
Patient has orders for Plavix and Coumadin to be given at 2200. Called [facility] to clarify if orders are correct. Per MD order, administer the medications. No new orders given. Will continue to monitor.

## 2012-01-07 NOTE — Preoperative (Signed)
Beta Blockers   Reason not to administer Beta Blockers:Not Applicable 
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
## 2012-01-07 NOTE — Anesthesia Postprocedure Evaluation (Signed)
Anesthesia Post Note  Patient: [name]  Procedure(s) Performed: Procedure(s) (LRB): AMPUTATION FOOT (Left)  Anesthesia type: general  Patient location: PACU  Post pain: Pain level controlled  Post assessment: Patient's Cardiovascular Status Stable  Last Vitals:  Filed Vitals:   01/07/12 1815  BP: 155/81  Pulse: 104  Temp: 37 C  Resp: 19    Post vital signs: Reviewed and stable  Level of consciousness: sedated  Complications: No apparent anesthesia complications

## 2012-01-07 NOTE — Progress Notes (Signed)
[facility] PROGRESS NOTE  Shabaka Flattery [id number] DOB: [birth year] DOA: 01/06/2012 PCP: Pryor Ochoa, MD  Assessment/Plan  Osteomyelitis of left forefoot ulcer:  Pt to OR for transmetatarsal amputation today per Dr. Sharol Given. - Continue vanc and Zosyn. - F/u cultures.  Diabetes: Elevated fingersticks likely due to stress and infection, but trending down nicely. - Continue glargine at current dose, as fingersticks are likely to decrease post-op.  - Continue SSI 0-15 - Consider adding meal coverage   Blood pressure: Labile BP post-anesthesia. - Trend BP.  Anemia: Likely of chronic disease. - Trend hemoglobin. - Transfuse for HGB < 7.    Diet:  Diabetic IVF:  LR @ 50 ml/hr post-op Access:  PIV Proph:  Lovenox   Code Status: Full code Family Communication: with patient Disposition Plan: pending results of cultures from amputation   Consultants:  Orthopedic surgery  Procedures:  Left foot transmetatarsal amputation (1/3)  Antibiotics:  Vancomycin 1/2 ->  Zosyn 1/2 ->   HPI/Subjective: Pt continues to have pain in the left foot, which is improved from prior.  Eating well.  Denies CP, SOB, N/V/D/C.  Objective: Filed Vitals:   01/07/12 1800 01/07/12 1815 01/07/12 1900 01/07/12 2047  BP: 157/82 155/81 179/84 157/77  Pulse: 103 104 106 105  Temp:  98.6 F (37 C) 99.2 F (37.3 C) 97.8 F (36.6 C)  TempSrc:   Oral Oral  Resp: 19 19 20 20   Height:    6' (1.829 m)  Weight:    83.499 kg (184 lb 1.3 oz)  SpO2: 100% 100% 100% 100%    Intake/Output Summary (Last 24 hours) at 01/07/12 2157 Last data filed at 01/07/12 2050  Gross per 24 hour  Intake   1330 ml  Output    350 ml  Net    980 ml   Filed Weights   01/06/12 0300 01/06/12 2121 01/07/12 2047  Weight: 84 kg (185 lb 3 oz) 83.5 kg (184 lb 1.4 oz) 83.499 kg (184 lb 1.3 oz)    Exam:   General:  Thin [name], NAD.  HEENT:  MMM  Cardiovascular:  RRR  Respiratory:  CTA bilaterally  Abdomen:   NABS, soft, NT/ND  MSK:  LLE ulcer with Curlex and ACE bandage by ortho.  Pt now has minimal tenderness along shin and calf of left leg.  There is less erythema and discoloration of the shin area.  Neuro:  Grossly intact.  Data Reviewed: Basic Metabolic Panel:  Lab [id number] 01/06/12 0800 01/06/12 0155  NA 135 -- 128*  K 4.2 -- 4.1  CL 95* -- 93*  CO2 26 -- 25  GLUCOSE 179* -- 328*  BUN 15 -- 18  CREATININE 1.11 0.91 1.17  CALCIUM 9.4 -- 9.0  MG -- -- --  PHOS -- -- --   Liver Function Tests: No results found for this basename: AST:5,ALT:5,ALKPHOS:5,BILITOT:5,PROT:5,ALBUMIN:5 in the last 168 hours No results found for this basename: LIPASE:5,AMYLASE:5 in the last 168 hours No results found for this basename: AMMONIA:5 in the last 168 hours CBC:  Lab 01/07/12 0520 01/06/12 0800 01/06/12 0155  WBC 9.6 7.4 8.5  NEUTROABS -- -- 7.1  HGB 10.2* 9.3* 9.4*  HCT 31.5* 28.6* 28.1*  MCV 83.8 82.9 83.4  PLT 381 353 347   Cardiac Enzymes: No results found for this basename: CKTOTAL:5,CKMB:5,CKMBINDEX:5,TROPONINI:5 in the last 168 hours BNP (last 3 results) No results found for this basename: PROBNP:3 in the last 8760 hours CBG:  Lab 01/07/12 2052 01/07/12 1851 01/07/12 1155  01/07/12 0737 01/06/12 2123  GLUCAP 235* 127* 164* 234* 138*    Recent Results (from the past 240 hour(s))  WOUND CULTURE     Status: Normal (Preliminary result)   Collection Time   01/06/12  3:34 AM      Component Value Range Status Comment   Specimen Description WOUND LEFT FOOT   Final    Special Requests Normal   Final    Gram Stain     Final    Value: MODERATE WCBP NO SQUAMOUS EPITHELIAL CELLS SEEN     FEW GRAM POSITIVE COCCI IN PAIRS     FEW GRAM POSITIVE RODS   Culture Culture reincubated for better growth   Final    Report Status PENDING   Incomplete   CULTURE, BLOOD (ROUTINE X 2)     Status: Normal (Preliminary result)   Collection Time   01/06/12  6:20 AM      Component Value Range Status  Comment   Specimen Description BLOOD RIGHT ARM   Final    Special Requests     Final    Value: BOTTLES DRAWN AEROBIC AND ANAEROBIC [place] AEROBIC 5CC ANAEROBIC PATIENT ON FOLLOWING ZOSYN VANCOMYCIN   Culture  Setup Time 01/06/2012 13:48   Final    Culture     Final    Value:        BLOOD CULTURE RECEIVED NO GROWTH TO DATE CULTURE WILL BE HELD FOR 5 DAYS BEFORE ISSUING A FINAL NEGATIVE REPORT   Report Status PENDING   Incomplete   CULTURE, BLOOD (ROUTINE X 2)     Status: Normal (Preliminary result)   Collection Time   01/06/12  6:30 AM      Component Value Range Status Comment   Specimen Description BLOOD LEFT ARM   Final    Special Requests     Final    Value: BOTTLES DRAWN AEROBIC ONLY 10CC PATIENT ON FOLLOWING ZOSYN VANCOMYCIN   Culture  Setup Time 01/06/2012 13:48   Final    Culture     Final    Value:        BLOOD CULTURE RECEIVED NO GROWTH TO DATE CULTURE WILL BE HELD FOR 5 DAYS BEFORE ISSUING A FINAL NEGATIVE REPORT   Report Status PENDING   Incomplete   MRSA PCR SCREENING     Status: Normal   Collection Time   01/07/12  8:42 AM      Component Value Range Status Comment   MRSA by PCR NEGATIVE  NEGATIVE Final      Studies: Mr Foot Left W Wo Contrast  01/07/2012  *RADIOLOGY REPORT*  Clinical Data: Left foot diabetic ulcer  MRI OF THE LEFT FOREFOOT WITHOUT AND WITH CONTRAST  Technique:  Multiplanar, multisequence MR imaging was performed both before and after administration of intravenous contrast.  Contrast: 68mL MULTIHANCE GADOBENATE DIMEGLUMINE 529 MG/ML IV SOLN  Comparison: Radiographs 01/06/2012.  Findings: As demonstrated on the radiographs there is a septic arthritis involving the metatarsal phalangeal joints of the fourth and fifth digits and osteomyelitis involving the fourth and fifth metatarsals and fourth and fifth proximal phalanges. There is significant surrounding soft tissue swelling/edema and soft tissue abscesses at the metatarsal phalangeal joints.  Osteomyelitis extends  proximally to the bases of the fourth and fifth metatarsals and I suspect that the middle and distal phalanges of the fourth and fifth digits are also affected.  There is air in the soft tissues and severe cellulitis and myofasciitis.  Marked deformity of the second  and third metatarsals but no obvious osteomyelitis. The first toe has been previously amputated.  Severe midfoot degenerative changes with marrow edema but no obvious osteomyelitis.  IMPRESSION:  1.  Septic arthritis at the metatarsophalangeal joints of the fourth and fifth digits with associated osteomyelitis involving the entire fourth and fifth rays. 2.  Severe cellulitis and diffuse myofasciitis. 3.  Soft tissue abscesses at the fourth and fifth metatarsal phalangeal joint regions. 4.  Severe midfoot degenerative changes.   Original Report Authenticated By: Marijo Sanes, M.D.    Dg Foot Complete Left  01/06/2012  *RADIOLOGY REPORT*  Clinical Data: Left foot pain and open sore.  LEFT FOOT - COMPLETE 3+ VIEW  Comparison: Left foot radiographs performed 11/24/2011  Findings: There are is marked erosion involving the distal aspect of the fifth metatarsal, and more mild erosion involving the distal fourth metatarsal, and bases of the fourth and fifth proximal phalanges.  Findings are compatible with osteomyelitis.  Overlying soft tissue air is seen.  Associated soft tissue disruption is noted.  Visualized joint spaces are otherwise grossly preserved.  The patient is status post amputation of the a portion of the first digit.  There is chronic bony remodelling at the second metatarsal, likely reflecting prior injury.  An os peroneum is noted.  A plantar spur is seen.  Mild diffuse soft tissue swelling is noted about the forefoot.  IMPRESSION:  1.  Marked erosion involving the distal aspect of the fifth metatarsal, and additional erosion involving the distal fourth metatarsal, and bases of the fourth and fifth proximal phalanges, compatible with  osteomyelitis. 2.  Overlying soft tissue air seen, with associated soft tissue disruption.   Original Report Authenticated By: Santa Lighter, M.D.     Scheduled Meds:   . aspirin EC  325 mg Oral Daily  . clopidogrel  75 mg Oral Q breakfast  . coumadin book   Does not apply Once  . enoxaparin (LOVENOX) injection  40 mg Subcutaneous Q24H  . gabapentin  300 mg Oral TID  . insulin aspart  0-15 Units Subcutaneous TID WC  . insulin aspart  0-5 Units Subcutaneous QHS  . insulin aspart  15 Units Subcutaneous TID AC  . insulin glargine  57 Units Subcutaneous Daily  . lisinopril  40 mg Oral Daily  . metFORMIN  1,000 mg Oral BID WC  . pantoprazole  40 mg Oral Daily  . piperacillin-tazobactam (ZOSYN)  IV  3.375 g Intravenous Q8H  . simvastatin  20 mg Oral q1800  . vancomycin  1,000 mg Intravenous Q8H  . warfarin  7.5 mg Oral ONCE-1800  . warfarin   Does not apply Once  . Warfarin - Pharmacist Dosing Inpatient   Does not apply q1800   Continuous Infusions:   . lactated ringers 50 mL/hr at 01/07/12 1600    Active Problems:  Diabetes mellitus type 2, uncontrolled, with complications  HYPERLIPIDEMIA  HYPERTENSION  Diabetic foot ulcer, lt.  S/P angioplasty with stent, 12/30/11, of Lt SFA, Post. tibialis and PTA of L. ant and post. tibial vessels  Hyponatremia    Time spent: 30 min    Tien Aispuro, [facility] Pager [phone]. If 8PM-8AM, please contact night-coverage at [email or web address], password [email or web address] 01/07/2012, 9:57 PM  LOS: 1 day

## 2012-01-07 NOTE — Op Note (Signed)
OPERATIVE REPORT  DATE OF SURGERY: 01/07/2012  PATIENT:  [name],  52 y.o. male  PRE-OPERATIVE DIAGNOSIS:  Osteomyelitis, abscess left foot  POST-OPERATIVE DIAGNOSIS:  Osteomyelitis, abscess left foot  PROCEDURE:  Procedure(s): AMPUTATION FOOT Transmetatarsal amputation left foot  SURGEON:  Surgeon(s): [name], MD  ANESTHESIA:   general  EBL:  Minimal ML  SPECIMEN:  Source of Specimen:  Left forefoot  TOURNIQUET:  * No tourniquets in log *  PROCEDURE DETAILS: Patient is a 52 year old gentleman diabetic insensate neuropathy with osteomyelitis ulceration beneath the fifth and fourth metatarsal heads. Patient is status post a great toe amputation and presents at this time for transmetatarsal amputation. Risks and benefits were discussed including infection nonhealing of the wound need for higher level amputation. Patient states he understands and wished to proceed at this time. Description of procedure patient brought to the operating room underwent a general anesthetic. After adequate levels of anesthesia were obtained patient's left lower extremity was prepped using DuraPrep and draped into a sterile field. A fishmouth incision was made to exclude the ulcerations. A transmetatarsal amputation was performed with a reciprocating saw. Hemostasis was obtained the wound was irrigated cleansed there was good healthy petechial granulation tissue and bleeding the muscles were viable. The incision was closed using 2-0 nylon. The wound was covered with Adaptic orthopedic sponges AB dressing Kerlix and Coban. Patient was extubated taken to the PACU in stable condition.  PLAN OF CARE: Admit to inpatient   PATIENT DISPOSITION:  PACU - hemodynamically stable.   [name], MD 01/07/2012 5:38 PM

## 2012-01-07 NOTE — Transfer of Care (Signed)
Immediate Anesthesia Transfer of Care Note  Patient: [name]  Procedure(s) Performed: Procedure(s) (LRB) with comments: AMPUTATION FOOT (Left) - Left midfoot amputation  Patient Location: PACU  Anesthesia Type:General  Level of Consciousness: sedated  Airway & Oxygen Therapy: Patient Spontanous Breathing and Patient connected to nasal cannula oxygen  Post-op Assessment: Report given to PACU RN and Post -op Vital signs reviewed and stable  Post vital signs: Reviewed and stable  Complications: No apparent anesthesia complications

## 2012-01-07 NOTE — Progress Notes (Signed)
ANTICOAGULATION CONSULT NOTE - Initial Consult  Pharmacy Consult for Coumadin Indication: VTE prophylaxis (s/p transmetatarsal foot amputation)  No Known Allergies  Patient Measurements: Height: 6' 0.05" (183 cm) Weight: 184 lb 1.4 oz (83.5 kg) IBW/kg (Calculated) : 77.71   Vital Signs: Temp: 98.6 F (37 C) (01/03 1815) Temp src: Oral (01/03 1405) BP: 155/81 mmHg (01/03 1815) Pulse Rate: 104  (01/03 1815)  Labs:  Basename 01/07/12 0520 01/06/12 0800 01/06/12 0155  HGB 10.2* 9.3* --  HCT 31.5* 28.6* 28.1*  PLT 381 353 347  APTT -- -- --  LABPROT -- -- --  INR -- -- --  HEPARINUNFRC -- -- --  CREATININE 1.11 0.91 1.17  CKTOTAL -- -- --  CKMB -- -- --  TROPONINI -- -- --    Estimated Creatinine Clearance: 86.5 ml/min (by C-G formula based on Cr of 1.11).   Medical History: Past Medical History  Diagnosis Date  . Hyperlipidemia   . Hypertension   . Osteomyelitis     left great toe, s/p amputation  . Osteomyelitis of ankle or foot 05/2011    rt foot  . Neuromuscular disorder     diabetic neruopathy  . PAD (peripheral artery disease)   . Pneumonia 2010  . Type II diabetes mellitus ~ 2002  . Critical lower limb ischemia, lt with ABI of 0.60 12/31/2011  . PVD (peripheral vascular disease) 12/31/2011  . S/P angioplasty with stent, 12/30/11, of Lt SFA, Post. tibialis and PTA of L. ant and post. tibial vessels 12/31/2011    Medications:  Scheduled:    . [COMPLETED] sodium chloride   Intravenous Once  . aspirin EC  325 mg Oral Daily  . [COMPLETED] chlorhexidine  60 mL Topical Once  . clopidogrel  75 mg Oral Q breakfast  . enoxaparin (LOVENOX) injection  40 mg Subcutaneous Q24H  . gabapentin  300 mg Oral TID  . insulin aspart  0-15 Units Subcutaneous TID WC  . insulin aspart  0-5 Units Subcutaneous QHS  . insulin aspart  15 Units Subcutaneous TID AC  . insulin glargine  57 Units Subcutaneous Daily  . lisinopril  40 mg Oral Daily  . metFORMIN  1,000 mg Oral BID  WC  . pantoprazole  40 mg Oral Daily  . piperacillin-tazobactam (ZOSYN)  IV  3.375 g Intravenous Q8H  . simvastatin  20 mg Oral q1800  . vancomycin  1,000 mg Intravenous Q8H    Assessment: 52 yo M s/p L great toe amputation returns to ER with nonhealing ulcer/osteomylitis on 4th and 5th toes.  Pt went to OR today for transmetatarsal amputation.  To begin Coumadin for post-op VTE prophylaxis.  Baseline INR 1.14.  Coumadin points = 9.  Goal of Therapy:  INR 2-3 Monitor platelets by anticoagulation protocol: Yes   Plan:  Coumadin 7.5mg  Po x 1 tonight. Coumadin education materials ordered.  Daily INR. D/C Lovenox when INR >=2.  [employer], Pharm.D., BCPS Clinical Pharmacist Pager [phone] 01/07/2012 6:54 PM

## 2012-01-07 NOTE — Anesthesia Preprocedure Evaluation (Signed)
Anesthesia Evaluation  Patient identified by MRN, date of birth, ID band Patient awake    Reviewed: Allergy & Precautions, H&P , NPO status , Patient's Chart, lab work & pertinent test results  Airway Mallampati: I TM Distance: >3 FB Neck ROM: Full    Dental   Pulmonary          Cardiovascular hypertension, Pt. on medications + Peripheral Vascular Disease     Neuro/Psych    GI/Hepatic   Endo/Other  diabetes, Type 2, Oral Hypoglycemic Agents and Insulin Dependent  Renal/GU      Musculoskeletal   Abdominal   Peds  Hematology   Anesthesia Other Findings   Reproductive/Obstetrics                           Anesthesia Physical Anesthesia Plan  ASA: III  Anesthesia Plan: General   Post-op Pain Management:    Induction: Intravenous  Airway Management Planned: LMA  Additional Equipment:   Intra-op Plan:   Post-operative Plan: Extubation in OR  Informed Consent: I have reviewed the patients History and Physical, chart, labs and discussed the procedure including the risks, benefits and alternatives for the proposed anesthesia with the patient or authorized representative who has indicated his/her understanding and acceptance.     Plan Discussed with: CRNA and Surgeon  Anesthesia Plan Comments:         Anesthesia Quick Evaluation

## 2012-01-08 DIAGNOSIS — Z9861 Coronary angioplasty status: Secondary | ICD-10-CM

## 2012-01-08 DIAGNOSIS — I739 Peripheral vascular disease, unspecified: Secondary | ICD-10-CM

## 2012-01-08 LAB — PROTIME-INR
INR: 1.1 (ref 0.00–1.49)
Prothrombin Time: 14.1 seconds (ref 11.6–15.2)

## 2012-01-08 LAB — GLUCOSE, CAPILLARY
Glucose-Capillary: 316 mg/dL — ABNORMAL HIGH (ref 70–99)
Glucose-Capillary: 175 mg/dL — ABNORMAL HIGH (ref 70–99)
Glucose-Capillary: 148 mg/dL — ABNORMAL HIGH (ref 70–99)
Glucose-Capillary: 163 mg/dL — ABNORMAL HIGH (ref 70–99)

## 2012-01-08 MED ORDER — WARFARIN SODIUM 7.5 MG PO TABS
7.5000 mg | ORAL_TABLET | Freq: Once | ORAL | Status: DC
  Filled 2012-01-08: qty 1

## 2012-01-08 NOTE — Progress Notes (Signed)
[facility] PROGRESS NOTE  [name] [id number] DOB: [birth year] DOA: 01/06/2012 PCP: Pryor Ochoa, MD  Assessment/Plan  Osteomyelitis of left forefoot ulcer:  POD 1 s/p transmetatarsal amputation (1/3) by Dr. Sharol Given. - Continue vanc and Zosyn. - BMP tomorrow - F/u cultures. - Wt bearing status per orthopedics - Discussed need for DVT prophylaxis with Dr. Lorin Mercy who is on call for Dr. Sharol Given today who was okay with discontinuing the coumadin in the setting of FD aspirin and plavix.  Although patient is at increased risk of DVT due to recent surgery, he is young, active and less likely to be sedentary post-operatively.  He is also at risk of falls given forefoot amputation and would be at risk of significant bleeding in setting of triple antiplatelet/anticoagulation medications. His stents are DES and are only 75 weeks old, still in the window of high risk in-stent thrombosis.    HTN/HLD: Labile BP post-anesthesia. - Trend BP. -  Continue lisinopril 40mg    Diabetes: Elevated fingersticks likely due to stress and infection, labile today, but trending down - Continue glargine 57 units.  - Continue SSI 0-15 and HS 0-5 - continue meal coverage 15 units  Anemia: Likely of chronic disease. -  CBC tomorrow - Defer work up to outpatient  PAD s/p placement of multiple DES to LLE on 12/26: -  Continue ASA 325mg  and plavix 75mg  daily  Diet:  Diabetic IVF:  OFF Access:  PIV Proph:  Lovenox  Code Status: Full code Family Communication: with patient Disposition Plan: pending results of cultures from amputation   Consultants:  Orthopedic surgery  Procedures:  Left foot transmetatarsal amputation (1/3)  Antibiotics:  Vancomycin 1/2 ->  Zosyn 1/2 ->   HPI/Subjective: Pt felt good last night but woke up around 4am with severe pain in the left forefoot which has now eased after pain medication.  Eating well.  Denies CP, SOB, N/V/D/C.  Objective: Filed Vitals:   01/07/12  1900 01/07/12 2047 01/08/12 0457 01/08/12 0915  BP: 179/84 157/77 160/81 134/63  Pulse: 106 105 104 98  Temp: 99.2 F (37.3 C) 97.8 F (36.6 C) 99.1 F (37.3 C) 98.6 F (37 C)  TempSrc: Oral Oral Oral Oral  Resp: 20 20 20 18   Height:  6' (1.829 m)    Weight:  83.499 kg (184 lb 1.3 oz)    SpO2: 100% 100% 100% 100%    Intake/Output Summary (Last 24 hours) at 01/08/12 1012 Last data filed at 01/08/12 0500  Gross per 24 hour  Intake   2220 ml  Output    900 ml  Net   1320 ml   Filed Weights   01/06/12 0300 01/06/12 2121 01/07/12 2047  Weight: 84 kg (185 lb 3 oz) 83.5 kg (184 lb 1.4 oz) 83.499 kg (184 lb 1.3 oz)    Exam:   General:  Thin [name], NAD.  HEENT:  MMM  Cardiovascular:  RRR  Respiratory:  CTA bilaterally  Abdomen:  NABS, soft, NT/ND  MSK:  LLE is mildly TTP in the calf region and forefoot is bandaged.    Neuro:  Grossly intact.  Data Reviewed: Basic Metabolic Panel:  Lab [id number] 0520 01/06/12 0800 01/06/12 0155  NA 135 -- 128*  K 4.2 -- 4.1  CL 95* -- 93*  CO2 26 -- 25  GLUCOSE 179* -- 328*  BUN 15 -- 18  CREATININE 1.11 0.91 1.17  CALCIUM 9.4 -- 9.0  MG -- -- --  PHOS -- -- --   Liver  Function Tests: No results found for this basename: AST:5,ALT:5,ALKPHOS:5,BILITOT:5,PROT:5,ALBUMIN:5 in the last 168 hours No results found for this basename: LIPASE:5,AMYLASE:5 in the last 168 hours No results found for this basename: AMMONIA:5 in the last 168 hours CBC:  Lab 01/07/12 0520 01/06/12 0800 01/06/12 0155  WBC 9.6 7.4 8.5  NEUTROABS -- -- 7.1  HGB 10.2* 9.3* 9.4*  HCT 31.5* 28.6* 28.1*  MCV 83.8 82.9 83.4  PLT 381 353 347   Cardiac Enzymes: No results found for this basename: CKTOTAL:5,CKMB:5,CKMBINDEX:5,TROPONINI:5 in the last 168 hours BNP (last 3 results) No results found for this basename: PROBNP:3 in the last 8760 hours CBG:  Lab 01/08/12 0754 01/07/12 2052 01/07/12 1851 01/07/12 1155 01/07/12 0737  GLUCAP 316* 235* 127* 164* 234*     Recent Results (from the past 240 hour(s))  WOUND CULTURE     Status: Normal (Preliminary result)   Collection Time   01/06/12  3:34 AM      Component Value Range Status Comment   Specimen Description WOUND LEFT FOOT   Final    Special Requests Normal   Final    Gram Stain     Final    Value: MODERATE WCBP NO SQUAMOUS EPITHELIAL CELLS SEEN     FEW GRAM POSITIVE COCCI IN PAIRS     FEW GRAM POSITIVE RODS   Culture Culture reincubated for better growth   Final    Report Status PENDING   Incomplete   CULTURE, BLOOD (ROUTINE X 2)     Status: Normal (Preliminary result)   Collection Time   01/06/12  6:20 AM      Component Value Range Status Comment   Specimen Description BLOOD RIGHT ARM   Final    Special Requests     Final    Value: BOTTLES DRAWN AEROBIC AND ANAEROBIC [place] AEROBIC 5CC ANAEROBIC PATIENT ON FOLLOWING ZOSYN VANCOMYCIN   Culture  Setup Time 01/06/2012 13:48   Final    Culture     Final    Value:        BLOOD CULTURE RECEIVED NO GROWTH TO DATE CULTURE WILL BE HELD FOR 5 DAYS BEFORE ISSUING A FINAL NEGATIVE REPORT   Report Status PENDING   Incomplete   CULTURE, BLOOD (ROUTINE X 2)     Status: Normal (Preliminary result)   Collection Time   01/06/12  6:30 AM      Component Value Range Status Comment   Specimen Description BLOOD LEFT ARM   Final    Special Requests     Final    Value: BOTTLES DRAWN AEROBIC ONLY 10CC PATIENT ON FOLLOWING ZOSYN VANCOMYCIN   Culture  Setup Time 01/06/2012 13:48   Final    Culture     Final    Value:        BLOOD CULTURE RECEIVED NO GROWTH TO DATE CULTURE WILL BE HELD FOR 5 DAYS BEFORE ISSUING A FINAL NEGATIVE REPORT   Report Status PENDING   Incomplete   MRSA PCR SCREENING     Status: Normal   Collection Time   01/07/12  8:42 AM      Component Value Range Status Comment   MRSA by PCR NEGATIVE  NEGATIVE Final      Studies: Mr Foot Left W Wo Contrast  01/07/2012  *RADIOLOGY REPORT*  Clinical Data: Left foot diabetic ulcer  MRI OF THE LEFT  FOREFOOT WITHOUT AND WITH CONTRAST  Technique:  Multiplanar, multisequence MR imaging was performed both before and after administration of intravenous  contrast.  Contrast: 88mL MULTIHANCE GADOBENATE DIMEGLUMINE 529 MG/ML IV SOLN  Comparison: Radiographs 01/06/2012.  Findings: As demonstrated on the radiographs there is a septic arthritis involving the metatarsal phalangeal joints of the fourth and fifth digits and osteomyelitis involving the fourth and fifth metatarsals and fourth and fifth proximal phalanges. There is significant surrounding soft tissue swelling/edema and soft tissue abscesses at the metatarsal phalangeal joints.  Osteomyelitis extends proximally to the bases of the fourth and fifth metatarsals and I suspect that the middle and distal phalanges of the fourth and fifth digits are also affected.  There is air in the soft tissues and severe cellulitis and myofasciitis.  Marked deformity of the second and third metatarsals but no obvious osteomyelitis. The first toe has been previously amputated.  Severe midfoot degenerative changes with marrow edema but no obvious osteomyelitis.  IMPRESSION:  1.  Septic arthritis at the metatarsophalangeal joints of the fourth and fifth digits with associated osteomyelitis involving the entire fourth and fifth rays. 2.  Severe cellulitis and diffuse myofasciitis. 3.  Soft tissue abscesses at the fourth and fifth metatarsal phalangeal joint regions. 4.  Severe midfoot degenerative changes.   Original Report Authenticated By: Marijo Sanes, M.D.     Scheduled Meds:    . aspirin EC  325 mg Oral Daily  . clopidogrel  75 mg Oral Q breakfast  . enoxaparin (LOVENOX) injection  40 mg Subcutaneous Q24H  . gabapentin  300 mg Oral TID  . insulin aspart  0-15 Units Subcutaneous TID WC  . insulin aspart  0-5 Units Subcutaneous QHS  . insulin aspart  15 Units Subcutaneous TID AC  . insulin glargine  57 Units Subcutaneous Daily  . lisinopril  40 mg Oral Daily  .  metFORMIN  1,000 mg Oral BID WC  . pantoprazole  40 mg Oral Daily  . piperacillin-tazobactam (ZOSYN)  IV  3.375 g Intravenous Q8H  . simvastatin  20 mg Oral q1800  . vancomycin  1,000 mg Intravenous Q8H  . warfarin   Does not apply Once  . Warfarin - Pharmacist Dosing Inpatient   Does not apply q1800   Continuous Infusions:    . lactated ringers 50 mL/hr at 01/07/12 1600    Active Problems:  Diabetes mellitus type 2, uncontrolled, with complications  HYPERLIPIDEMIA  HYPERTENSION  Diabetic foot ulcer, lt.  S/P angioplasty with stent, 12/30/11, of Lt SFA, Post. tibialis and PTA of L. ant and post. tibial vessels  Hyponatremia    Time spent: 30 min    Tacora Athanas, [place] [facility] Pager [phone]. If 8PM-8AM, please contact night-coverage at [email or web address], password [facility] 01/08/2012, 10:12 AM  LOS: 2 days

## 2012-01-08 NOTE — Evaluation (Signed)
Physical Therapy Evaluation Patient Details Name: [name] MRN: [id number] DOB: 05-Nov-1960 Today's Date: 01/08/2012 Time: [id number] PT Time Calculation (min): 30 min  PT Assessment / Plan / Recommendation Clinical Impression  Patient is a 52 yo male s/p transmetatarsal amputation Lt foot.  Pain limiting mobility today.  Patient will benefit from acute PT to maximize independence with mobility/gait prior to discharge.  Do not anticipate any f/u PT needs.     PT Assessment  Patient needs continued PT services    Follow Up Recommendations  No PT follow up;Supervision - Intermittent    Does the patient have the potential to tolerate intense rehabilitation      Barriers to Discharge Decreased caregiver support;Other (comment) (Patient without residence at this time)      Equipment Recommendations  Rolling walker with 5" wheels    Recommendations for Other Services     Frequency Min 5X/week    Precautions / Restrictions Precautions Precautions: Fall Restrictions Weight Bearing Restrictions: Yes LLE Weight Bearing: Non weight bearing   Pertinent Vitals/Pain Pain 8/10 with mobility.      Mobility  Bed Mobility Bed Mobility: Supine to Sit;Sit to Supine Supine to Sit: 6: Modified independent (Device/Increase time);With rails Sit to Supine: 6: Modified independent (Device/Increase time);With rail Details for Bed Mobility Assistance: No cues or assist needed. Transfers Transfers: Sit to Stand;Stand to Sit Sit to Stand: 4: Min guard;With upper extremity assist;From bed Stand to Sit: 4: Min guard;With upper extremity assist;To bed Details for Transfer Assistance: Verbal cues for hand placement.  Instructed patient on NWB status for LLE. Ambulation/Gait Ambulation/Gait Assistance: 4: Min assist Ambulation Distance (Feet): 10 Feet Assistive device: Rolling walker Ambulation/Gait Assistance Details: Verbal cues for gait sequence and NWB LLE.  Patient ambulated 5' and pain began  to increase from 6 to 8/10.  Returned to bed and to supine.  Unable to participate further.  RN made aware of pain and need for meds. Gait Pattern: Step-to pattern           PT Diagnosis: Difficulty walking;Abnormality of gait;Acute pain  PT Problem List: Decreased strength;Decreased activity tolerance;Decreased balance;Decreased mobility;Decreased knowledge of use of DME;Decreased knowledge of precautions;Pain PT Treatment Interventions: DME instruction;Gait training;Stair training;Functional mobility training;Patient/family education   PT Goals Acute Rehab PT Goals PT Goal Formulation: With patient Time For Goal Achievement: 01/15/12 Potential to Achieve Goals: Good Pt will go Sit to Stand: with modified independence;with upper extremity assist PT Goal: Sit to Stand - Progress: Goal set today Pt will go Stand to Sit: with modified independence;with upper extremity assist PT Goal: Stand to Sit - Progress: Goal set today Pt will Ambulate: >150 feet;with modified independence;with least restrictive assistive device PT Goal: Ambulate - Progress: Goal set today Pt will Go Up / Down Stairs: 3-5 stairs;with modified independence;with rail(s);with least restrictive assistive device PT Goal: Up/Down Stairs - Progress: Goal set today  Visit Information  Last PT Received On: 01/08/12 Assistance Needed: +1    Subjective Data  Subjective: "I've lost my job and my residence with this problem with my foot" Patient Stated Goal: To decrease pain and walk.   Prior Functioning  Home Living Lives With: Alone Available Help at Discharge: Other (Comment) (Unsure of where he will live at discharge.) Home Adaptive Equipment: None Prior Function Level of Independence: Independent Able to Take Stairs?: Yes Driving: Yes Vocation: Full time employment Communication Communication: No difficulties    Cognition  Overall Cognitive Status: Appears within functional limits for tasks  assessed/performed Arousal/Alertness: Awake/alert  Orientation Level: Appears intact for tasks assessed Behavior During Session: [facility] for tasks performed    Extremity/Trunk Assessment Right Upper Extremity Assessment RUE ROM/Strength/Tone: Within functional levels RUE Sensation: WFL - Light Touch Left Upper Extremity Assessment LUE ROM/Strength/Tone: Within functional levels LUE Sensation: WFL - Light Touch Right Lower Extremity Assessment RLE ROM/Strength/Tone: Within functional levels RLE Sensation: WFL - Light Touch Left Lower Extremity Assessment LLE ROM/Strength/Tone: Deficits;Unable to fully assess;Due to pain LLE ROM/Strength/Tone Deficits: Able to move against gravity Trunk Assessment Trunk Assessment: Normal   Balance Balance Balance Assessed: Yes Static Sitting Balance Static Sitting - Balance Support: No upper extremity supported Static Sitting - Level of Assistance: 7: Independent Static Sitting - Comment/# of Minutes: Patient with good sitting balance with feet unsupported.  End of Session PT - End of Session Equipment Utilized During Treatment: Gait belt Activity Tolerance: Patient limited by pain Patient left: in bed;with call bell/phone within reach;with family/visitor present Nurse Communication: Mobility status;Patient requests pain meds  GP Functional Assessment Tool Used: Clinical judgement Functional Limitation: Mobility: Walking and moving around Mobility: Walking and Moving Around Current Status [phone]): At least 20 percent but less than 40 percent impaired, limited or restricted Mobility: Walking and Moving Around Goal Status [phone]): 0 percent impaired, limited or restricted   [name], [name] 01/08/2012, 5:04 PM Carita Pian. Sanjuana Kava, [place] Pager [phone]

## 2012-01-08 NOTE — Progress Notes (Signed)
ANTICOAGULATION CONSULT NOTE - Follow up [place] for Coumadin Indication: VTE prophylaxis (s/p transmetatarsal foot amputation)  No Known Allergies  Patient Measurements: Height: 6' (182.9 cm) Weight: 184 lb 1.3 oz (83.499 kg) IBW/kg (Calculated) : 77.6   Vital Signs: Temp: 98.6 F (37 C) (01/04 0915) Temp src: Oral (01/04 0915) BP: 134/63 mmHg (01/04 0915) Pulse Rate: 98  (01/04 0915)  Labs:  Basename 01/08/12 0732 01/07/12 0520 01/06/12 0800 01/06/12 0155  HGB -- 10.2* 9.3* --  HCT -- 31.5* 28.6* 28.1*  PLT -- 381 353 347  APTT -- -- -- --  LABPROT 14.1 -- -- --  INR 1.10 -- -- --  HEPARINUNFRC -- -- -- --  CREATININE -- 1.11 0.91 1.17  CKTOTAL -- -- -- --  CKMB -- -- -- --  TROPONINI -- -- -- --    Estimated Creatinine Clearance: 86.4 ml/min (by C-G formula based on Cr of 1.11).   Medical History: Past Medical History  Diagnosis Date  . Hyperlipidemia   . Hypertension   . Osteomyelitis     left great toe, s/p amputation  . Osteomyelitis of ankle or foot 05/2011    rt foot  . Neuromuscular disorder     diabetic neruopathy  . PAD (peripheral artery disease)   . Pneumonia 2010  . Type II diabetes mellitus ~ 2002  . Critical lower limb ischemia, lt with ABI of 0.60 12/31/2011  . PVD (peripheral vascular disease) 12/31/2011  . S/P angioplasty with stent, 12/30/11, of Lt SFA, Post. tibialis and PTA of L. ant and post. tibial vessels 12/31/2011    Medications:  Scheduled:     . aspirin EC  325 mg Oral Daily  . [COMPLETED] chlorhexidine  60 mL Topical Once  . clopidogrel  75 mg Oral Q breakfast  . [COMPLETED] coumadin book   Does not apply Once  . enoxaparin (LOVENOX) injection  40 mg Subcutaneous Q24H  . gabapentin  300 mg Oral TID  . insulin aspart  0-15 Units Subcutaneous TID WC  . insulin aspart  0-5 Units Subcutaneous QHS  . insulin aspart  15 Units Subcutaneous TID AC  . insulin glargine  57 Units Subcutaneous Daily  .  lisinopril  40 mg Oral Daily  . metFORMIN  1,000 mg Oral BID WC  . pantoprazole  40 mg Oral Daily  . piperacillin-tazobactam (ZOSYN)  IV  3.375 g Intravenous Q8H  . simvastatin  20 mg Oral q1800  . vancomycin  1,000 mg Intravenous Q8H  . [COMPLETED] warfarin  7.5 mg Oral ONCE-1800  . warfarin   Does not apply Once  . Warfarin - Pharmacist Dosing Inpatient   Does not apply q1800    Assessment: 52 yo [name] s/p L great toe amputation returns to ER with nonhealing ulcer/osteomylitis on 4th and 5th toes.  Pt went to OR 01/03 for transmetatarsal amputation.  Coumadin started  for post-op VTE prophylaxis. Current INR 1.1, this was drawn only 8hrs post-1st dose so is not reflective of much.  Goal of Therapy:  INR 2-3 Monitor platelets by anticoagulation protocol: Yes   Plan:  Coumadin 7.5mg  Po x 1 tonight. Daily INR. D/C Lovenox when INR >=2. Patient needs coumadin education  Loralee Pacas. Gala Romney.D. Clinical Pharmacist Pager [phone] Phone [phone] 01/08/2012 10:50 AM

## 2012-01-09 DIAGNOSIS — E871 Hypo-osmolality and hyponatremia: Secondary | ICD-10-CM

## 2012-01-09 LAB — WOUND CULTURE: Special Requests: NORMAL

## 2012-01-09 LAB — GLUCOSE, CAPILLARY
Glucose-Capillary: 114 mg/dL — ABNORMAL HIGH (ref 70–99)
Glucose-Capillary: 269 mg/dL — ABNORMAL HIGH (ref 70–99)
Glucose-Capillary: 243 mg/dL — ABNORMAL HIGH (ref 70–99)
Glucose-Capillary: 199 mg/dL — ABNORMAL HIGH (ref 70–99)

## 2012-01-09 LAB — CBC
Hemoglobin: 9 g/dL — ABNORMAL LOW (ref 13.0–17.0)
Platelets: 341 10*3/uL (ref 150–400)
RBC: 3.33 MIL/uL — ABNORMAL LOW (ref 4.22–5.81)
WBC: 9.1 10*3/uL (ref 4.0–10.5)

## 2012-01-09 LAB — BASIC METABOLIC PANEL
CO2: 28 mEq/L (ref 19–32)
Chloride: 94 mEq/L — ABNORMAL LOW (ref 96–112)
Glucose, Bld: 275 mg/dL — ABNORMAL HIGH (ref 70–99)
Potassium: 4 mEq/L (ref 3.5–5.1)
Sodium: 131 mEq/L — ABNORMAL LOW (ref 135–145)

## 2012-01-09 MED ORDER — GABAPENTIN 400 MG PO CAPS
400.0000 mg | ORAL_CAPSULE | Freq: Three times a day (TID) | ORAL | Status: DC
  Administered 2012-01-09 – 2012-01-10 (×2): 400 mg via ORAL
  Filled 2012-01-09 (×4): qty 1

## 2012-01-09 MED ORDER — INSULIN ASPART 100 UNIT/ML ~~LOC~~ SOLN
18.0000 [IU] | Freq: Three times a day (TID) | SUBCUTANEOUS | Status: DC
  Administered 2012-01-10 (×3): 18 [IU] via SUBCUTANEOUS

## 2012-01-09 MED ORDER — INSULIN GLARGINE 100 UNIT/ML ~~LOC~~ SOLN
65.0000 [IU] | Freq: Every day | SUBCUTANEOUS | Status: DC
  Administered 2012-01-10: 65 [IU] via SUBCUTANEOUS

## 2012-01-09 NOTE — Progress Notes (Addendum)
[facility] PROGRESS NOTE  Trevaris Strenger [id number] DOB: 12-Apr-1960 DOA: 01/06/2012 PCP: Pryor Ochoa, MD  Assessment/Plan  Osteomyelitis of left forefoot ulcer:  POD 2 s/p transmetatarsal amputation (1/3) by Dr. Sharol Given. - Continue vanc and Zosyn. - Wound cx grew mixed flora - Do not see pathology pending in lab from amputation - Wt bearing status per orthopedics - Will arrange for home health PT and supplies    HTN/HLD: Mildly elevated BP's, likely from pain. - Trend BP. - Continue lisinopril 40mg   - Control pain  Diabetes: Elevated fingersticks likely due to stress and infection, labile today, but trending down - Increase glargine to 65 units.  - Continue SSI 0-15 and HS 0-5 - Increase meal coverage to 18 units  Anemia: Likely of chronic disease. -  CBC tomorrow - Defer work up to outpatient  PAD s/p placement of multiple DES to LLE on 12/26: -  Continue ASA 325mg  and plavix 75mg  daily  Pain control: - Continue narcotics - Increase gabapentin  Diet:  Diabetic IVF:  OFF Access:  PIV Proph:  Lovenox  Code Status: Full code Family Communication: with patient Disposition Plan: pending results of cultures from amputation   Consultants:  Orthopedic surgery  Procedures:  Left foot transmetatarsal amputation (1/3)  Antibiotics:  Vancomycin 1/2 ->  Zosyn 1/2 ->   HPI/Subjective: Pt continues to have intermittent severe pain that is stabbing in the left foot, particularly when he moves his foot from an elevated position to the dependent position.  Eating well.  Denies CP, SOB, N/V/D/C.  Objective: Filed Vitals:   01/09/12 0519 01/09/12 0957 01/09/12 1359 01/09/12 1652  BP: 137/70 139/70 160/78 156/79  Pulse: 96 101 95 93  Temp: 99.3 F (37.4 C) 99 F (37.2 C) 98.9 F (37.2 C) 98.9 F (37.2 C)  TempSrc: Oral Oral Oral Oral  Resp: 18 16 16 16   Height:      Weight:      SpO2: 99% 98% 100% 100%    Intake/Output Summary (Last 24 hours) at  01/09/12 1707 Last data filed at 01/09/12 0958  Gross per 24 hour  Intake    740 ml  Output   1400 ml  Net   -660 ml   Filed Weights   01/06/12 2121 01/07/12 2047 01/08/12 2148  Weight: 83.5 kg (184 lb 1.4 oz) 83.499 kg (184 lb 1.3 oz) 83.008 kg (183 lb)    Exam:   General:  Thin [name], NAD.  HEENT:  MMM  Cardiovascular:  RRR  Respiratory:  CTA bilaterally  Abdomen:  NABS, soft, NT/ND  MSK:  LLE is mildly TTP in the calf region and forefoot has clean, dry, and intact bandages.    Neuro:  Grossly intact.  Data Reviewed: Basic Metabolic Panel:  Lab [id number] 01/07/12 0520 01/06/12 0800 01/06/12 0155  NA 131* 135 -- 128*  K 4.0 4.2 -- 4.1  CL 94* 95* -- 93*  CO2 28 26 -- 25  GLUCOSE 275* 179* -- 328*  BUN 11 15 -- 18  CREATININE 0.94 1.11 0.91 1.17  CALCIUM 9.1 9.4 -- 9.0  MG -- -- -- --  PHOS -- -- -- --   Liver Function Tests: No results found for this basename: AST:5,ALT:5,ALKPHOS:5,BILITOT:5,PROT:5,ALBUMIN:5 in the last 168 hours No results found for this basename: LIPASE:5,AMYLASE:5 in the last 168 hours No results found for this basename: AMMONIA:5 in the last 168 hours CBC:  Lab 01/09/12 0555 01/07/12 0520 01/06/12 0800 01/06/12 0155  WBC 9.1 9.6 7.4  8.5  NEUTROABS -- -- -- 7.1  HGB 9.0* 10.2* 9.3* 9.4*  HCT 27.6* 31.5* 28.6* 28.1*  MCV 82.9 83.8 82.9 83.4  PLT 341 381 353 347   Cardiac Enzymes: No results found for this basename: CKTOTAL:5,CKMB:5,CKMBINDEX:5,TROPONINI:5 in the last 168 hours BNP (last 3 results) No results found for this basename: PROBNP:3 in the last 8760 hours CBG:  Lab 01/09/12 1654 01/09/12 1144 01/09/12 0746 01/08/12 2145 01/08/12 1710  GLUCAP 243* 269* 348* 163* 148*    Recent Results (from the past 240 hour(s))  WOUND CULTURE     Status: Normal   Collection Time   01/06/12  3:34 AM      Component Value Range Status Comment   Specimen Description WOUND LEFT FOOT   Final    Special Requests Normal   Final    Gram  Stain     Final    Value: MODERATE WCBP NO SQUAMOUS EPITHELIAL CELLS SEEN     FEW GRAM POSITIVE COCCI IN PAIRS     FEW [place]     [id number]   Culture     Final    Value: ABUNDANT DIPHTHEROIDS(CORYNEBACTERIUM SPECIES)     Note: Standardized susceptibility testing for this organism is not available.     FEW GROUP B STREP(S.AGALACTIAE)ISOLATED     Note: TESTING AGAINST S. AGALACTIAE NOT ROUTINELY PERFORMED DUE TO PREDICTABILITY OF AMP/PEN/VAN SUSCEPTIBILITY.   Report Status 01/09/2012 FINAL   Final   CULTURE, BLOOD (ROUTINE X 2)     Status: Normal (Preliminary result)   Collection Time   01/06/12  6:20 AM      Component Value Range Status Comment   Specimen Description BLOOD RIGHT ARM   Final    Special Requests     Final    Value: BOTTLES DRAWN AEROBIC AND ANAEROBIC [place] AEROBIC 5CC ANAEROBIC PATIENT ON FOLLOWING ZOSYN VANCOMYCIN   Culture  Setup Time 01/06/2012 13:48   Final    Culture     Final    Value:        BLOOD CULTURE RECEIVED NO GROWTH TO DATE CULTURE WILL BE HELD FOR 5 DAYS BEFORE ISSUING A FINAL NEGATIVE REPORT   Report Status PENDING   Incomplete   CULTURE, BLOOD (ROUTINE X 2)     Status: Normal (Preliminary result)   Collection Time   01/06/12  6:30 AM      Component Value Range Status Comment   Specimen Description BLOOD LEFT ARM   Final    Special Requests     Final    Value: BOTTLES DRAWN AEROBIC ONLY 10CC PATIENT ON FOLLOWING ZOSYN VANCOMYCIN   Culture  Setup Time 01/06/2012 13:48   Final    Culture     Final    Value:        BLOOD CULTURE RECEIVED NO GROWTH TO DATE CULTURE WILL BE HELD FOR 5 DAYS BEFORE ISSUING A FINAL NEGATIVE REPORT   Report Status PENDING   Incomplete   MRSA PCR SCREENING     Status: Normal   Collection Time   01/07/12  8:42 AM      Component Value Range Status Comment   MRSA by PCR NEGATIVE  NEGATIVE Final      Studies: No results found.  Scheduled Meds:    . aspirin EC  325 mg Oral Daily  . clopidogrel  75 mg Oral Q breakfast   . enoxaparin (LOVENOX) injection  40 mg Subcutaneous Q24H  . gabapentin  300 mg Oral  TID  . insulin aspart  0-15 Units Subcutaneous TID WC  . insulin aspart  0-5 Units Subcutaneous QHS  . insulin aspart  15 Units Subcutaneous TID AC  . insulin glargine  57 Units Subcutaneous Daily  . lisinopril  40 mg Oral Daily  . metFORMIN  1,000 mg Oral BID WC  . pantoprazole  40 mg Oral Daily  . piperacillin-tazobactam (ZOSYN)  IV  3.375 g Intravenous Q8H  . simvastatin  20 mg Oral q1800  . vancomycin  1,000 mg Intravenous Q8H   Continuous Infusions:    Active Problems:  Diabetes mellitus type 2, uncontrolled, with complications  HYPERLIPIDEMIA  HYPERTENSION  Diabetic foot ulcer, lt.  S/P angioplasty with stent, 12/30/11, of Lt SFA, Post. tibialis and PTA of L. ant and post. tibial vessels  Hyponatremia    Time spent: 30 min    Yasmeen Manka, [facility] Pager [phone]. If 8PM-8AM, please contact night-coverage at [email or web address], password [facility] 01/09/2012, 5:07 PM  LOS: 3 days

## 2012-01-09 NOTE — Progress Notes (Signed)
Physical Therapy Treatment Patient Details Name: [name] MRN: [id number] DOB: 10-Jun-1960 Today's Date: 01/09/2012 Time: [id number] PT Time Calculation (min): 25 min  PT Assessment / Plan / Recommendation Comments on Treatment Session  Making slow, but steady progress; Plan for stair training next session; Given pt's limited amb distance (even with pain meds), it may be worth considering a wheelchair with elevating legrests, and HHPT follow-up post dc (this was not originally recommended)    Follow Up Recommendations  Home health PT;Supervision - Intermittent     Does the patient have the potential to tolerate intense rehabilitation     Barriers to Discharge        Equipment Recommendations  Rolling walker with 5" wheels;Wheelchair (measurements PT) (with elevating legrests -- will continue to eval for possible need for wheelchair)    Recommendations for Other Services    Frequency Min 5X/week   Plan Discharge plan needs to be updated    Precautions / Restrictions Precautions Precautions: Fall Restrictions LLE Weight Bearing: Non weight bearing   Pertinent Vitals/Pain 5/10 Left foot initially; incr to 8/10 with foot in dependent position Was premedicated    Mobility  Bed Mobility Bed Mobility: Supine to Sit;Sit to Supine Supine to Sit: 6: Modified independent (Device/Increase time);With rails Sit to Supine: 6: Modified independent (Device/Increase time);With rail Details for Bed Mobility Assistance: No cues or assist needed. Transfers Transfers: Sit to Stand;Stand to Sit Sit to Stand: 4: Min guard;With upper extremity assist;From bed (without physical contact) Stand to Sit: 4: Min guard;With upper extremity assist;To chair/3-in-1;To bed (without physical contact) Details for Transfer Assistance: Verbal cues for hand placement.  Instructed patient on NWB status for LLE. Ambulation/Gait Ambulation/Gait Assistance: 4: Min guard Ambulation Distance (Feet): 22  Feet Assistive device: Rolling walker Ambulation/Gait Assistance Details: Verbal and demo cues to press body weight into RW for smoother stepping of Right foot as opposed to hopping; Pt able to increase amb distance today; Still, overall amb distance was limited by pain Gait Pattern: Step-to pattern    Exercises     PT Diagnosis:    PT Problem List:   PT Treatment Interventions:     PT Goals Acute Rehab PT Goals Time For Goal Achievement: 01/15/12 Potential to Achieve Goals: Good Pt will go Sit to Stand: with modified independence;with upper extremity assist PT Goal: Sit to Stand - Progress: Progressing toward goal Pt will go Stand to Sit: with modified independence;with upper extremity assist PT Goal: Stand to Sit - Progress: Progressing toward goal Pt will Ambulate: >150 feet;with modified independence;with least restrictive assistive device PT Goal: Ambulate - Progress: Progressing toward goal  Visit Information  Last PT Received On: 01/09/12 Assistance Needed: +1    Subjective Data  Subjective: Is worried that he can't tolerate any time standing up secondary to pain in foot Patient Stated Goal: To decrease pain and walk.   Cognition  Overall Cognitive Status: Appears within functional limits for tasks assessed/performed Arousal/Alertness: Awake/alert Orientation Level: Appears intact for tasks assessed Behavior During Session: [facility] for tasks performed    Balance     End of Session PT - End of Session Equipment Utilized During Treatment: Gait belt Activity Tolerance: Patient limited by pain Patient left: in bed;with call bell/phone within reach;with family/visitor present Nurse Communication: Mobility status   GP     Quin Hoop [place], [place]  01/09/2012, 1:58 PM

## 2012-01-09 NOTE — Progress Notes (Signed)
ANTIBIOTIC CONSULT NOTE - INITIAL  Pharmacy Consult for Vancomycin Indication: Infected left foot, s/p transmetatarsal amputation 1/3  No Known Allergies  Patient Measurements: Height: 6' (182.9 cm) Weight: 183 lb (83.008 kg) IBW/kg (Calculated) : 77.6    Vital Signs: Temp: 98.9 F (37.2 C) (01/05 1359) Temp src: Oral (01/05 1359) BP: 160/78 mmHg (01/05 1359) Pulse Rate: 95  (01/05 1359)  Labs:  Basename 01/09/12 0555 01/07/12 0520  WBC 9.1 9.6  HGB 9.0* 10.2*  PLT 341 381  LABCREA -- --  CREATININE 0.94 1.11   Estimated Creatinine Clearance: 102 ml/min (by C-G formula based on Cr of 0.94). No results found for this basename: VANCOTROUGH:2,VANCOPEAK:2,VANCORANDOM:2,GENTTROUGH:2,GENTPEAK:2,GENTRANDOM:2,TOBRATROUGH:2,TOBRAPEAK:2,TOBRARND:2,AMIKACINPEAK:2,AMIKACINTROU:2,AMIKACIN:2, in the last 72 hours   Microbiology: Recent Results (from the past 720 hour(s))  WOUND CULTURE     Status: Normal   Collection Time   01/06/12  3:34 AM      Component Value Range Status Comment   Specimen Description WOUND LEFT FOOT   Final    Special Requests Normal   Final    Gram Stain     Final    Value: MODERATE WCBP NO SQUAMOUS EPITHELIAL CELLS SEEN     FEW GRAM POSITIVE COCCI IN PAIRS     FEW [place]     [id number]   Culture     Final    Value: ABUNDANT DIPHTHEROIDS(CORYNEBACTERIUM SPECIES)     Note: Standardized susceptibility testing for this organism is not available.     FEW GROUP B STREP(S.AGALACTIAE)ISOLATED     Note: TESTING AGAINST S. AGALACTIAE NOT ROUTINELY PERFORMED DUE TO PREDICTABILITY OF AMP/PEN/VAN SUSCEPTIBILITY.   Report Status 01/09/2012 FINAL   Final   CULTURE, BLOOD (ROUTINE X 2)     Status: Normal (Preliminary result)   Collection Time   01/06/12  6:20 AM      Component Value Range Status Comment   Specimen Description BLOOD RIGHT ARM   Final    Special Requests     Final    Value: BOTTLES DRAWN AEROBIC AND ANAEROBIC [place] AEROBIC 5CC ANAEROBIC PATIENT  ON FOLLOWING ZOSYN VANCOMYCIN   Culture  Setup Time 01/06/2012 13:48   Final    Culture     Final    Value:        BLOOD CULTURE RECEIVED NO GROWTH TO DATE CULTURE WILL BE HELD FOR 5 DAYS BEFORE ISSUING A FINAL NEGATIVE REPORT   Report Status PENDING   Incomplete   CULTURE, BLOOD (ROUTINE X 2)     Status: Normal (Preliminary result)   Collection Time   01/06/12  6:30 AM      Component Value Range Status Comment   Specimen Description BLOOD LEFT ARM   Final    Special Requests     Final    Value: BOTTLES DRAWN AEROBIC ONLY 10CC PATIENT ON FOLLOWING ZOSYN VANCOMYCIN   Culture  Setup Time 01/06/2012 13:48   Final    Culture     Final    Value:        BLOOD CULTURE RECEIVED NO GROWTH TO DATE CULTURE WILL BE HELD FOR 5 DAYS BEFORE ISSUING A FINAL NEGATIVE REPORT   Report Status PENDING   Incomplete   MRSA PCR SCREENING     Status: Normal   Collection Time   01/07/12  8:42 AM      Component Value Range Status Comment   MRSA by PCR NEGATIVE  NEGATIVE Final      Medications:  ASA  Plavix  Neurontin  Hctz  Novlolog  Lantus  Zestril  Metformin  Prilosec  Pravachol  Bactrim  Tramadol  Assessment: 52 yo male with foot infection, possible osteomyelitis, for empiric antibiotics  Goal of Therapy:  Vancomycin trough level 15-20 mcg/ml  Plan:  Continue vancomycin 1g IV q8h.  Check vancomycin trough in AM. Zosyn 3.375g IV q8h (infuse over 4 hours) Continue to monitor renal function  MRSA not found in wound culture.  Consider stopping vancomycin.  Candie [place] 01/09/2012,3:34 PM

## 2012-01-10 ENCOUNTER — Encounter (HOSPITAL_COMMUNITY): Payer: Self-pay | Admitting: Orthopedic Surgery

## 2012-01-10 LAB — CBC
MCH: 26.7 pg (ref 26.0–34.0)
MCHC: 32 g/dL (ref 30.0–36.0)
RDW: 12.5 % (ref 11.5–15.5)

## 2012-01-10 LAB — BASIC METABOLIC PANEL
BUN: 11 mg/dL (ref 6–23)
Calcium: 9.2 mg/dL (ref 8.4–10.5)
GFR calc Af Amer: >90 mL/min (ref >90)
GFR calc non Af Amer: >90 mL/min (ref >90)
Glucose, Bld: 315 mg/dL — ABNORMAL HIGH (ref 70–99)
Potassium: 4.2 mEq/L (ref 3.5–5.1)
Sodium: 135 mEq/L (ref 135–145)

## 2012-01-10 LAB — GLUCOSE, CAPILLARY
Glucose-Capillary: 152 mg/dL — ABNORMAL HIGH (ref 70–99)
Glucose-Capillary: 125 mg/dL — ABNORMAL HIGH (ref 70–99)

## 2012-01-10 MED ORDER — CIPROFLOXACIN HCL 500 MG PO TABS: 500.0000 mg | ORAL_TABLET | Freq: Two times a day (BID) | ORAL | Status: DC

## 2012-01-10 MED ORDER — SENNOSIDES-DOCUSATE SODIUM 8.6-50 MG PO TABS: 1.0000 | ORAL_TABLET | Freq: Two times a day (BID) | ORAL | Status: DC

## 2012-01-10 MED ORDER — GABAPENTIN 300 MG PO CAPS
600.0000 mg | ORAL_CAPSULE | Freq: Three times a day (TID) | ORAL | Status: DC
  Administered 2012-01-10: 600 mg via ORAL
  Filled 2012-01-10 (×3): qty 2

## 2012-01-10 MED ORDER — VANCOMYCIN HCL 10 G IV SOLR
1250.0000 mg | Freq: Three times a day (TID) | INTRAVENOUS | Status: DC
  Administered 2012-01-10: 1250 mg via INTRAVENOUS
  Filled 2012-01-10 (×4): qty 1250

## 2012-01-10 MED ORDER — GABAPENTIN 300 MG PO CAPS: 600.0000 mg | ORAL_CAPSULE | Freq: Three times a day (TID) | ORAL | Status: DC

## 2012-01-10 MED ORDER — HYDROCODONE-ACETAMINOPHEN 5-325 MG PO TABS: 1.0000 | ORAL_TABLET | ORAL | Status: DC | PRN

## 2012-01-10 MED ORDER — INSULIN GLARGINE 100 UNIT/ML ~~LOC~~ SOLN: 65.0000 [IU] | Freq: Every day | SUBCUTANEOUS | Status: DC

## 2012-01-10 MED ORDER — CLINDAMYCIN HCL 300 MG PO CAPS: 600.0000 mg | ORAL_CAPSULE | Freq: Three times a day (TID) | ORAL | Status: DC

## 2012-01-10 MED ORDER — INSULIN ASPART 100 UNIT/ML ~~LOC~~ SOLN: 20.0000 [IU] | Freq: Three times a day (TID) | SUBCUTANEOUS | Status: DC

## 2012-01-10 NOTE — Discharge Summary (Signed)
Physician Discharge Summary  [name] [id number] DOB: 08-07-1960 DOA: 01/06/2012  PCP: Pryor Ochoa, MD  Admit date: 01/06/2012 Discharge date: 01/10/2012  Recommendations for Outpatient Follow-up:  1. F/u with Dr. Sharol Given in 2 weeks 2. F/u with primary care doctor in 1 week (pain control, blood pressure control and fingerstick check)  Discharge Diagnoses:  Active Problems:  Diabetes mellitus type 2, uncontrolled, with complications  HYPERLIPIDEMIA  HYPERTENSION  Diabetic foot ulcer, lt.  S/P angioplasty with stent, 12/30/11, of Lt SFA, Post. tibialis and PTA of L. ant and post. 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 Started off as a small blister, it now encompasses a large area of the left plantar surface of the left wall of foot. On 12/26 the patient underwent a angioscope PTA of the left anterior and posterior tibial vessels with stenting of the posterior tibialis with a drug-eluting stent, and stenting of the left SFA with a Nitinol self expanding stent. See note on 12/26. He was off his feet for 3 days post procedure. Patient states on the third day he stood and had severe pain in the left lower extremity. He he is seen in wound care clinic once weekly, he has not been there since before Christmas. He is doing his own dressing changes daily. He states that he now has increased drainage from the foot along with increased pain. He is on Bactrim twice daily. Today had chills, nausea and vomiting, no hematemesis and no fever. He decided to come to the ER because of the severity of the pain, which she states is similar to  the pain he had prior to the amputation on his right foot.  Hospital Course:   Mr. 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 Sharol Given on 1/3.  His wound culture grew mixed flora and amputation was not sent for pathology/culture.  He was treated with vancomycin and zosyn empirically and was transitioned to clindamycin/ciprofloxacin to complete a 10 day course of antibiotics given severity of infection, the soft tissue component of which extended superior to the ankle and has only slowly been improving.  He should remain non-weight bearing on the right lower extremity until he follows up with Dr. Sharol Given in clinic.  Will give Rx for pain medication, including narcotics and gabapentin, and stool softeners.  Because the patient is already on full dose aspirin and plavix due to recent stenting of the left lower extremity with a DES, he was not continued on coumadin for DVT prophylaxis.  He is at high risk of falls due to his forefoot amputation, which could lead to serious or life-threatening hemorrhage if on 2 antiplatelet agents and coumadin.      HTN/HLD:  Mildly elevated blood pressures, likely from pain.  Have been increasing his pain medication but may need additional antihypertensives added if still elevated after he recovers more from surgery.  PCP to follow up.  Diabetes:  Fingersticks were elevated.  He should take lantus 65 units, aspart 20 units with meals with additional sliding scale insulin.    Anemia, likely of chronic disease.  Defer to work up to primary care doctor if not already complete.  PAD s/p placement of multiple DES to LLE on 12/26.  Continue ASA 325mg  and plavix 75mg  daily.    Consultants:  Orthopedic surgery Procedures:  Left foot transmetatarsal amputation (1/3) Antibiotics:  Vancomycin 1/2 -> 1/6 Zosyn 1/2 -> 1/6  Discharge Exam: Filed Vitals:   01/10/12 0955  BP: 168/85  Pulse: 96  Temp: 98.9 F (37.2 C)   Resp: 20   Filed Vitals:   01/09/12 1652 01/09/12 2140 01/10/12 0537 01/10/12 0955  BP: 156/79 154/81 153/81 168/85  Pulse: 93 105 99 96  Temp: 98.9 F (37.2 C) 99.4 F (37.4 C) 98.6 F (37 C) 98.9 F (37.2 C)  TempSrc: Oral Oral Oral Oral  Resp: 16 16 16 20   Height:      Weight:  83.9 kg (184 lb 15.5 oz)    SpO2: 100% 98% 99% 99%    General: Thin [name], NAD.  HEENT: MMM  Cardiovascular: RRR  Respiratory: CTA bilaterally  Abdomen: NABS, soft, NT/ND  MSK: LLE is minimally TTP in the calf region and forefoot has clean, dry, and intact bandages.  Neuro: Grossly intact.  Discharge Instructions      Discharge Orders    Future Appointments: Provider: Department: Dept Phone: Center:   01/25/2012 3:30 PM [facility] [place] [facility] [address] [id number] None   02/11/2012 8:00 AM [facility]     Future Orders Please Complete By Expires   Diet - low sodium heart healthy      Increase activity slowly      Discharge instructions      Comments:   You were hospitalized with an infected foot ulcer and underwent a transmetatarsal amputation.  You should keep your foot elevated when resting.  Do NOT bear weight on the left foot until you follow up with Dr. Sharol Given in 2 weeks.  Keep the dressing clean and dry.  To treat your pain, I increased your gabapentin to 2 times your previous dose.  You also have a prescription for vicodin.  Please take senakot to prevent constipation and you may take miralax (available OTC) 17 to 34gm as needed to continue having at least one bowel movement a day until you no longer require narcotic pain medication.  Your insulin was increased due to high blood sugars.  Take 65 units of lantus daily and 20 units of aspart with meals.  Please continue ciprofloxacin and clindamycin for five more days to completely treat your infection.  It is normal to have softer stools while  on clindamycin, but it does increase your risk of infectious diarrhea. Please call your primary care doctor if you start having more than 5 watery bowel movements a day.   Other Restrictions      Comments:   Non weight bearing left lower extremity   Leave dressing on - Keep it clean, dry, and intact until clinic visit      Call MD for:  temperature >100.4      Call MD for:  persistant nausea and vomiting      Call MD for:  severe uncontrolled pain      Call MD for:  redness, tenderness, or signs of infection (pain, swelling, redness, odor or green/yellow discharge around incision site)      Call MD for:  difficulty breathing, headache or visual disturbances      Call MD for:  hives      Call MD for:  persistant dizziness or light-headedness      Call MD for:  extreme fatigue          Medication List     As of 01/10/2012  2:35 PM    STOP taking these medications         sulfamethoxazole-trimethoprim 800-160 MG per tablet   Commonly known as: BACTRIM DS,SEPTRA DS      TAKE these medications         aspirin 325 MG EC tablet   Take 325 mg by mouth daily.      ciprofloxacin 500 MG tablet   Commonly known as: CIPRO   Take 1 tablet (500 mg total) by mouth 2 (two) times daily.      clindamycin 300 MG capsule   Commonly known as: CLEOCIN   Take 2 capsules (600 mg total) by mouth 3 (three) times daily.      clopidogrel 75 MG tablet   Commonly known as: PLAVIX   Take 75 mg by mouth daily.      gabapentin 300 MG capsule   Commonly known as: NEURONTIN   Take 2 capsules (600 mg total) by mouth 3 (three) times daily.      hydrochlorothiazide 25 MG tablet   Commonly known as: HYDRODIURIL   Take 25 mg by mouth daily.      HYDROcodone-acetaminophen 5-325 MG per tablet   Commonly known as: NORCO/VICODIN   Take 1-2 tablets by mouth every 4 (four) hours as needed.      insulin aspart 100 UNIT/ML injection   Commonly known as: novoLOG   Inject 20 Units into the skin 3 (three) times  daily before meals.      insulin glargine 100 UNIT/ML injection   Commonly known as: LANTUS   Inject 65 Units into the skin at bedtime.      lisinopril 40 MG tablet   Commonly known as: PRINIVIL,ZESTRIL   Take 40 mg by mouth daily.      metFORMIN 1000 MG tablet   Commonly known as: GLUCOPHAGE   Take 1,000 mg by mouth 2 (two) times daily with a meal.      omeprazole 20 MG capsule   Commonly known as: PRILOSEC   Take 20 mg by mouth daily.      pravastatin 40 MG tablet   Commonly known as: PRAVACHOL   Take 40 mg by mouth daily.      senna-docusate 8.6-50 MG per tablet   Commonly known as: Senokot-S   Take 1 tablet by mouth 2 (two) times daily.         Follow-up Information    Follow up with DUDA,MARCUS V, MD. In 2 weeks.   Contact information:   [place] [address] [phone]       Follow up with Pryor Ochoa, MD. Schedule an appointment as soon as possible for a visit in 1 week. (diabetes, blood pressure, pain control)    Contact information:   [address] [place] [address] [phone]           The results of significant diagnostics from this hospitalization (including imaging, microbiology, ancillary and laboratory) are listed below for reference.    Significant Diagnostic Studies: Mr Foot Left W Wo Contrast  01/07/2012  *RADIOLOGY REPORT*  Clinical Data: Left foot diabetic ulcer  MRI OF THE LEFT FOREFOOT WITHOUT AND WITH CONTRAST  Technique:  Multiplanar, multisequence MR imaging  was performed both before and after administration of intravenous contrast.  Contrast: 65mL MULTIHANCE GADOBENATE DIMEGLUMINE 529 MG/ML IV SOLN  Comparison: Radiographs 01/06/2012.  Findings: As demonstrated on the radiographs there is a septic arthritis involving the metatarsal phalangeal joints of the fourth and fifth digits and osteomyelitis involving the fourth and fifth metatarsals and fourth and fifth proximal phalanges. There is  significant surrounding soft tissue swelling/edema and soft tissue abscesses at the metatarsal phalangeal joints.  Osteomyelitis extends proximally to the bases of the fourth and fifth metatarsals and I suspect that the middle and distal phalanges of the fourth and fifth digits are also affected.  There is air in the soft tissues and severe cellulitis and myofasciitis.  Marked deformity of the second and third metatarsals but no obvious osteomyelitis. The first toe has been previously amputated.  Severe midfoot degenerative changes with marrow edema but no obvious osteomyelitis.  IMPRESSION:  1.  Septic arthritis at the metatarsophalangeal joints of the fourth and fifth digits with associated osteomyelitis involving the entire fourth and fifth rays. 2.  Severe cellulitis and diffuse myofasciitis. 3.  Soft tissue abscesses at the fourth and fifth metatarsal phalangeal joint regions. 4.  Severe midfoot degenerative changes.   Original Report Authenticated By: Marijo Sanes, M.D.    Dg Foot Complete Left  01/06/2012  *RADIOLOGY REPORT*  Clinical Data: Left foot pain and open sore.  LEFT FOOT - COMPLETE 3+ VIEW  Comparison: Left foot radiographs performed 11/24/2011  Findings: There are is marked erosion involving the distal aspect of the fifth metatarsal, and more mild erosion involving the distal fourth metatarsal, and bases of the fourth and fifth proximal phalanges.  Findings are compatible with osteomyelitis.  Overlying soft tissue air is seen.  Associated soft tissue disruption is noted.  Visualized joint spaces are otherwise grossly preserved.  The patient is status post amputation of the a portion of the first digit.  There is chronic bony remodelling at the second metatarsal, likely reflecting prior injury.  An os peroneum is noted.  A plantar spur is seen.  Mild diffuse soft tissue swelling is noted about the forefoot.  IMPRESSION:  1.  Marked erosion involving the distal aspect of the fifth metatarsal, and  additional erosion involving the distal fourth metatarsal, and bases of the fourth and fifth proximal phalanges, compatible with osteomyelitis. 2.  Overlying soft tissue air seen, with associated soft tissue disruption.   Original Report Authenticated By: Santa Lighter, M.D.     Microbiology: Recent Results (from the past 240 hour(s))  WOUND CULTURE     Status: Normal   Collection Time   01/06/12  3:34 AM      Component Value Range Status Comment   Specimen Description WOUND LEFT FOOT   Final    Special Requests Normal   Final    Gram Stain     Final    Value: MODERATE WCBP NO SQUAMOUS EPITHELIAL CELLS SEEN     FEW GRAM POSITIVE COCCI IN PAIRS     FEW [place]     [id number]   Culture     Final    Value: ABUNDANT DIPHTHEROIDS(CORYNEBACTERIUM SPECIES)     Note: Standardized susceptibility testing for this organism is not available.     FEW GROUP B STREP(S.AGALACTIAE)ISOLATED     Note: TESTING AGAINST S. AGALACTIAE NOT ROUTINELY PERFORMED DUE TO PREDICTABILITY OF AMP/PEN/VAN SUSCEPTIBILITY.   Report Status 01/09/2012 FINAL   Final   CULTURE, BLOOD (ROUTINE X 2)  Status: Normal (Preliminary result)   Collection Time   01/06/12  6:20 AM      Component Value Range Status Comment   Specimen Description BLOOD RIGHT ARM   Final    Special Requests     Final    Value: BOTTLES DRAWN AEROBIC AND ANAEROBIC [place] AEROBIC 5CC ANAEROBIC PATIENT ON FOLLOWING ZOSYN VANCOMYCIN   Culture  Setup Time 01/06/2012 13:48   Final    Culture     Final    Value:        BLOOD CULTURE RECEIVED NO GROWTH TO DATE CULTURE WILL BE HELD FOR 5 DAYS BEFORE ISSUING A FINAL NEGATIVE REPORT   Report Status PENDING   Incomplete   CULTURE, BLOOD (ROUTINE X 2)     Status: Normal (Preliminary result)   Collection Time   01/06/12  6:30 AM      Component Value Range Status Comment   Specimen Description BLOOD LEFT ARM   Final    Special Requests     Final    Value: BOTTLES DRAWN AEROBIC ONLY 10CC PATIENT ON FOLLOWING  ZOSYN VANCOMYCIN   Culture  Setup Time 01/06/2012 13:48   Final    Culture     Final    Value:        BLOOD CULTURE RECEIVED NO GROWTH TO DATE CULTURE WILL BE HELD FOR 5 DAYS BEFORE ISSUING A FINAL NEGATIVE REPORT   Report Status PENDING   Incomplete   MRSA PCR SCREENING     Status: Normal   Collection Time   01/07/12  8:42 AM      Component Value Range Status Comment   MRSA by PCR NEGATIVE  NEGATIVE Final      Labs: Basic Metabolic Panel:  Lab [id number] 01/09/12 0555 01/07/12 0520 01/06/12 0800 01/06/12 0155  NA 135 131* 135 -- 128*  K 4.2 4.0 4.2 -- 4.1  CL 96 94* 95* -- 93*  CO2 29 28 26  -- 25  GLUCOSE 315* 275* 179* -- 328*  BUN 11 11 15  -- 18  CREATININE 0.86 0.94 1.11 0.91 1.17  CALCIUM 9.2 9.1 9.4 -- 9.0  MG -- -- -- -- --  PHOS -- -- -- -- --   Liver Function Tests: No results found for this basename: AST:5,ALT:5,ALKPHOS:5,BILITOT:5,PROT:5,ALBUMIN:5 in the last 168 hours No results found for this basename: LIPASE:5,AMYLASE:5 in the last 168 hours No results found for this basename: AMMONIA:5 in the last 168 hours CBC:  Lab 01/10/12 0633 01/09/12 0555 01/07/12 0520 01/06/12 0800 01/06/12 0155  WBC 8.6 9.1 9.6 7.4 8.5  NEUTROABS -- -- -- -- 7.1  HGB 8.9* 9.0* 10.2* 9.3* 9.4*  HCT 27.8* 27.6* 31.5* 28.6* 28.1*  MCV 83.5 82.9 83.8 82.9 83.4  PLT 349 341 381 353 347   Cardiac Enzymes: No results found for this basename: CKTOTAL:5,CKMB:5,CKMBINDEX:5,TROPONINI:5 in the last 168 hours BNP: BNP (last 3 results) No results found for this basename: PROBNP:3 in the last 8760 hours CBG:  Lab 01/10/12 1142 01/10/12 0750 01/09/12 2137 01/09/12 1654 01/09/12 1144  GLUCAP 152* 301* 199* 243* 269*    Time coordinating discharge: 45 minutes  Signed:  Tyshawna Alarid  [facility] 01/10/2012, 2:35 PM

## 2012-01-10 NOTE — Care Management Note (Addendum)
   CARE MANAGEMENT NOTE 01/10/2012  Patient:  Figge,Tatsuo   Account Number:  [id number]  Date Initiated:  01/10/2012  Documentation initiated by:  Jossalin Chervenak  Subjective/Objective Assessment:   Order for DME and HHPT.     Action/Plan:   [facility] to work with pt on DME needs and [facility] to provide HHPT services.   Anticipated DC Date:  01/10/2012   Anticipated DC Plan:  [place]         [facility] Choice  HOME HEALTH  DURABLE MEDICAL EQUIPMENT   Choice offered to / List presented to:     DME arranged  [place]      DME agency  [place] arranged  [place].   Status of service:  Completed, signed off Medicare Important Message given?   (If response is "NO", the following Medicare IM given date fields will be blank) Date Medicare IM given:   Date Additional Medicare IM given:    Discharge Disposition:  [place]  Per UR Regulation:    If discussed at Long Length of Stay Meetings, dates discussed:    Comments:    Pt not able to purchase medications and does not have an [place] card from the clinic as yet.  Pt setup with [place] program and given letter with prescriptions. Program explained to pt. Jasmine Pang RN MPH Case Manager [phone]

## 2012-01-10 NOTE — Progress Notes (Signed)
Patient was discharged home with family. Patient was given discharge paperwork and prescriptions. Patient was given prescription paperwork on the match program-paper and pharmacies patient can bring prescriptions to. Patient was educated on signs and symptoms of infection and to call the doctor with questions or concerns. Patient went home with a walker and a wheelchair. Patient was taught how to use the wheelchair and was told to use the walker instead of the wheelchair as much as possible. Patient was stable upon discharge.

## 2012-01-10 NOTE — Progress Notes (Signed)
ANTIBIOTIC CONSULT NOTE - FOLLOW UP  Pharmacy Consult for Vancomycin Indication: L foot cellulitis/osteo now s/p transmetatarsal amputation on 1/3  No Known Allergies  Patient Measurements: Height: 6' (182.9 cm) Weight: 184 lb 15.5 oz (83.9 kg) IBW/kg (Calculated) : 77.6   Vital Signs: Temp: 98.6 F (37 C) (01/06 0537) Temp src: Oral (01/06 0537) BP: 153/81 mmHg (01/06 0537) Pulse Rate: 99  (01/06 0537) Intake/Output from previous day: 01/05 0701 - 01/06 0700 In: 540 [P.O.:240; IV Piggyback:300] Out: 726 [Urine:725; Stool:1] Intake/Output from this shift:    Labs:  [facility] 01/10/12 0633 01/09/12 0555  WBC 8.6 9.1  HGB 8.9* 9.0*  PLT 349 341  LABCREA -- --  CREATININE 0.86 0.94   Estimated Creatinine Clearance: 111.5 ml/min (by C-G formula based on Cr of 0.86).  Basename 01/10/12 [id number]  VANCOTROUGH 13.3  VANCOPEAK --  VANCORANDOM --  GENTTROUGH --  GENTPEAK --  GENTRANDOM --  TOBRATROUGH --  [place] --  AMIKACIN --     Microbiology: Recent Results (from the past 720 hour(s))  WOUND CULTURE     Status: Normal   Collection Time   01/06/12  3:34 AM      Component Value Range Status Comment   Specimen Description WOUND LEFT FOOT   Final    Special Requests Normal   Final    Gram Stain     Final    Value: MODERATE WCBP NO SQUAMOUS EPITHELIAL CELLS SEEN     FEW GRAM POSITIVE COCCI IN PAIRS     FEW [place]     [id number]   Culture     Final    Value: ABUNDANT DIPHTHEROIDS(CORYNEBACTERIUM SPECIES)     Note: Standardized susceptibility testing for this organism is not available.     FEW GROUP B STREP(S.AGALACTIAE)ISOLATED     Note: TESTING AGAINST S. AGALACTIAE NOT ROUTINELY PERFORMED DUE TO PREDICTABILITY OF AMP/PEN/VAN SUSCEPTIBILITY.   Report Status 01/09/2012 FINAL   Final   CULTURE, BLOOD (ROUTINE X 2)     Status: Normal (Preliminary result)   Collection Time   01/06/12  6:20 AM      Component  Value Range Status Comment   Specimen Description BLOOD RIGHT ARM   Final    Special Requests     Final    Value: BOTTLES DRAWN AEROBIC AND ANAEROBIC [place] AEROBIC 5CC ANAEROBIC PATIENT ON FOLLOWING ZOSYN VANCOMYCIN   Culture  Setup Time 01/06/2012 13:48   Final    Culture     Final    Value:        BLOOD CULTURE RECEIVED NO GROWTH TO DATE CULTURE WILL BE HELD FOR 5 DAYS BEFORE ISSUING A FINAL NEGATIVE REPORT   Report Status PENDING   Incomplete   CULTURE, BLOOD (ROUTINE X 2)     Status: Normal (Preliminary result)   Collection Time   01/06/12  6:30 AM      Component Value Range Status Comment   Specimen Description BLOOD LEFT ARM   Final    Special Requests     Final    Value: BOTTLES DRAWN AEROBIC ONLY 10CC PATIENT ON FOLLOWING ZOSYN VANCOMYCIN   Culture  Setup Time 01/06/2012 13:48   Final    Culture     Final    Value:        BLOOD CULTURE RECEIVED NO GROWTH TO DATE CULTURE WILL BE HELD FOR 5 DAYS BEFORE ISSUING A FINAL NEGATIVE REPORT  Report Status PENDING   Incomplete   MRSA PCR SCREENING     Status: Normal   Collection Time   01/07/12  8:42 AM      Component Value Range Status Comment   MRSA by PCR NEGATIVE  NEGATIVE Final     Anti-infectives     Start     Dose/Rate Route Frequency Ordered Stop   01/10/12 1000   vancomycin (VANCOCIN) 1,250 mg in sodium chloride 0.9 % 250 mL IVPB        1,250 mg 166.7 mL/hr over 90 Minutes Intravenous Every 8 hours 01/10/12 0838     01/06/12 1400   vancomycin (VANCOCIN) IVPB 1000 mg/200 mL premix  Status:  Discontinued        1,000 mg 200 mL/hr over 60 Minutes Intravenous Every 8 hours 01/06/12 0452 01/10/12 0838   01/06/12 1300  piperacillin-tazobactam (ZOSYN) IVPB 3.375 g       3.375 g 12.5 mL/hr over 240 Minutes Intravenous 3 times per day 01/06/12 0833     01/06/12 0530   vancomycin (VANCOCIN) 1,500 mg in sodium chloride 0.9 % 500 mL IVPB        1,500 mg 250 mL/hr over 120 Minutes Intravenous  Once 01/06/12 0452 01/06/12 0723    01/06/12 0430  piperacillin-tazobactam (ZOSYN) IVPB 3.375 g       3.375 g 100 mL/hr over 30 Minutes Intravenous  Once 01/06/12 0427 01/06/12 0522          Assessment: 52 y.o. M who continues on Vancomycin + Zosyn for empiric coverage of L foot cellulitis/osteo now s/p transmetatarsal amputation on 1/3. Vancomycin trough this morning was slightly SUBtherapeutic (Vanc trough 13.3, goal of 15-20 mcg/ml). Renal function remains stable. Estimated CrCl~100 ml/min. Will increase dose slightly to attain target trough range.  Consider de-escalating and/or stopping antibiotics now that the patient is s/p amputation (if the surgery produced clean margins and the source of infection is now removed). Also, the patient has not grown any MRSA from wound cultures -- therefore Vancomycin is likely not necessary.  Goal of Therapy:  Vancomycin trough level 15-20 mcg/ml  Plan:  1. Increase Vancomycin to 1250 mg IV every 8 hours 2. Continue Zosyn 3.375g IV every 8 hours 3. Will continue to follow renal function, culture results, LOT, and antibiotic de-escalation plans   Alycia Rossetti, PharmD, BCPS Clinical Pharmacist Pager: [phone] 01/10/2012 8:51 AM

## 2012-01-10 NOTE — Progress Notes (Signed)
Physical Therapy Treatment Patient Details Name: [name] MRN: [id number] DOB: 02-29-1960 Today's Date: 01/10/2012 Time: [id number] PT Time Calculation (min): 24 min  PT Assessment / Plan / Recommendation Comments on Treatment Session  Pt making slow, steady progress.  Agree with plan for w/c with elevating legrests and HHPT.    Follow Up Recommendations  Home health PT     Does the patient have the potential to tolerate intense rehabilitation     Barriers to Discharge        Equipment Recommendations  Rolling walker with 5" wheels;Wheelchair (measurements PT) (with elevating legrests.)    Recommendations for Other Services    Frequency Min 5X/week   Plan Discharge plan remains appropriate;Frequency remains appropriate    Precautions / Restrictions Precautions Precautions: Fall Restrictions LLE Weight Bearing: Non weight bearing   Pertinent Vitals/Pain Pain 9/10 in lt foot with activity.  Was premedicated.    Mobility  Bed Mobility Supine to Sit: 6: Modified independent (Device/Increase time) Sit to Supine: 6: Modified independent (Device/Increase time) Transfers Sit to Stand: 4: Min guard;With upper extremity assist;From bed Stand to Sit: 4: Min guard;With upper extremity assist;To bed Details for Transfer Assistance: verbal cues for hand placement Ambulation/Gait Ambulation/Gait Assistance: 4: Min guard Ambulation Distance (Feet): 30 Feet Assistive device: Rolling walker Ambulation/Gait Assistance Details: Incr time needed due to pain. Gait Pattern: Step-to pattern;Trunk flexed Gait velocity: Decr Stairs: Yes Stairs Assistance: 4: Min assist Stairs Assistance Details (indicate cue type and reason): verbal cues for technique Stair Management Technique: Backwards;With walker Number of Stairs: 1     Exercises     PT Diagnosis:    PT Problem List:   PT Treatment Interventions:     PT Goals Acute Rehab PT Goals PT Goal: Sit to Stand - Progress: Progressing  toward goal PT Goal: Stand to Sit - Progress: Progressing toward goal PT Goal: Ambulate - Progress: Progressing toward goal PT Goal: Up/Down Stairs - Progress: Progressing toward goal  Visit Information  Last PT Received On: 01/10/12 Assistance Needed: +1    Subjective Data  Subjective: "I'm sorry," pt apologizing that he didn't walk further.   Cognition  Overall Cognitive Status: Appears within functional limits for tasks assessed/performed Arousal/Alertness: Awake/alert Orientation Level: Appears intact for tasks assessed Behavior During Session: [facility] for tasks performed    Balance  Static Standing Balance Static Standing - Balance Support: Bilateral upper extremity supported (on walker) Static Standing - Level of Assistance: 4: Min assist  End of Session PT - End of Session Equipment Utilized During Treatment: Other (comment) (used waist band of pants for gait belt.) Activity Tolerance: Patient limited by pain Patient left: in bed;with call bell/phone within reach;with nursing in room Nurse Communication: Mobility status   GP     [facility] 01/10/2012, 3:54 PM  [employer] PT [phone]

## 2012-01-12 LAB — CULTURE, BLOOD (ROUTINE X 2)
Culture: NO GROWTH
Culture: NO GROWTH

## 2012-01-25 ENCOUNTER — Encounter (HOSPITAL_COMMUNITY): Payer: Self-pay

## 2012-02-11 ENCOUNTER — Encounter (HOSPITAL_BASED_OUTPATIENT_CLINIC_OR_DEPARTMENT_OTHER): Payer: BC Managed Care – PPO | Attending: General Surgery

## 2012-05-01 ENCOUNTER — Encounter: Payer: Self-pay | Admitting: Cardiovascular Disease

## 2012-05-15 ENCOUNTER — Emergency Department (HOSPITAL_COMMUNITY)
Admission: EM | Admit: 2012-05-15 | Discharge: 2012-05-15 | Disposition: A | Discharge status: Home / Self Care | Payer: Medicaid Other | Attending: Emergency Medicine | Admitting: Emergency Medicine

## 2012-05-15 ENCOUNTER — Emergency Department (HOSPITAL_COMMUNITY): Payer: Medicaid Other

## 2012-05-15 DIAGNOSIS — Z8679 Personal history of other diseases of the circulatory system: Secondary | ICD-10-CM | POA: Insufficient documentation

## 2012-05-15 DIAGNOSIS — Z79899 Other long term (current) drug therapy: Secondary | ICD-10-CM | POA: Insufficient documentation

## 2012-05-15 DIAGNOSIS — J3489 Other specified disorders of nose and nasal sinuses: Secondary | ICD-10-CM | POA: Insufficient documentation

## 2012-05-15 DIAGNOSIS — Z8739 Personal history of other diseases of the musculoskeletal system and connective tissue: Secondary | ICD-10-CM | POA: Insufficient documentation

## 2012-05-15 DIAGNOSIS — I10 Essential (primary) hypertension: Secondary | ICD-10-CM | POA: Insufficient documentation

## 2012-05-15 DIAGNOSIS — S98119A Complete traumatic amputation of unspecified great toe, initial encounter: Secondary | ICD-10-CM | POA: Insufficient documentation

## 2012-05-15 DIAGNOSIS — Z8701 Personal history of pneumonia (recurrent): Secondary | ICD-10-CM | POA: Insufficient documentation

## 2012-05-15 DIAGNOSIS — Z9861 Coronary angioplasty status: Secondary | ICD-10-CM | POA: Insufficient documentation

## 2012-05-15 DIAGNOSIS — J069 Acute upper respiratory infection, unspecified: Secondary | ICD-10-CM | POA: Insufficient documentation

## 2012-05-15 DIAGNOSIS — J029 Acute pharyngitis, unspecified: Secondary | ICD-10-CM | POA: Insufficient documentation

## 2012-05-15 DIAGNOSIS — E1169 Type 2 diabetes mellitus with other specified complication: Secondary | ICD-10-CM | POA: Insufficient documentation

## 2012-05-15 DIAGNOSIS — Z794 Long term (current) use of insulin: Secondary | ICD-10-CM | POA: Insufficient documentation

## 2012-05-15 DIAGNOSIS — E785 Hyperlipidemia, unspecified: Secondary | ICD-10-CM | POA: Insufficient documentation

## 2012-05-15 DIAGNOSIS — Z8669 Personal history of other diseases of the nervous system and sense organs: Secondary | ICD-10-CM | POA: Insufficient documentation

## 2012-05-15 DIAGNOSIS — R079 Chest pain, unspecified: Secondary | ICD-10-CM | POA: Insufficient documentation

## 2012-05-15 DIAGNOSIS — Z7982 Long term (current) use of aspirin: Secondary | ICD-10-CM | POA: Insufficient documentation

## 2012-05-15 DIAGNOSIS — R739 Hyperglycemia, unspecified: Secondary | ICD-10-CM

## 2012-05-15 DIAGNOSIS — R0989 Other specified symptoms and signs involving the circulatory and respiratory systems: Secondary | ICD-10-CM | POA: Insufficient documentation

## 2012-05-15 DIAGNOSIS — Z87891 Personal history of nicotine dependence: Secondary | ICD-10-CM | POA: Insufficient documentation

## 2012-05-15 LAB — GLUCOSE, CAPILLARY: Glucose-Capillary: 404 mg/dL — ABNORMAL HIGH (ref 70–99)

## 2012-05-15 LAB — CBC WITH DIFFERENTIAL/PLATELET
Basophils Absolute: 0 10*3/uL (ref 0.0–0.1)
Eosinophils Absolute: 0.1 10*3/uL (ref 0.0–0.7)
Eosinophils Relative: 1 % (ref 0–5)
MCH: 29.7 pg (ref 26.0–34.0)
MCHC: 34.4 g/dL (ref 30.0–36.0)
MCV: 86.4 fL (ref 78.0–100.0)
Platelets: 203 10*3/uL (ref 150–400)
RDW: 11.9 % (ref 11.5–15.5)

## 2012-05-15 LAB — POCT I-STAT TROPONIN I: Troponin i, poc: 0 ng/mL (ref 0.00–0.08)

## 2012-05-15 LAB — COMPREHENSIVE METABOLIC PANEL
ALT: 18 U/L (ref 0–53)
AST: 21 U/L (ref 0–37)
Calcium: 9.8 mg/dL (ref 8.4–10.5)
GFR calc Af Amer: 81 mL/min — ABNORMAL LOW (ref >90)
Glucose, Bld: 565 mg/dL (ref 70–99)
Sodium: 133 mEq/L — ABNORMAL LOW (ref 135–145)
Total Protein: 7.4 g/dL (ref 6.0–8.3)

## 2012-05-15 MED ORDER — SODIUM CHLORIDE 0.9 % IV BOLUS (SEPSIS)
1000.0000 mL | Freq: Once | INTRAVENOUS | Status: AC
  Administered 2012-05-15: 1000 mL via INTRAVENOUS

## 2012-05-15 MED ORDER — BENZONATATE 100 MG PO CAPS
100.0000 mg | ORAL_CAPSULE | Freq: Once | ORAL | Status: AC
  Administered 2012-05-15: 100 mg via ORAL
  Filled 2012-05-15: qty 1

## 2012-05-15 MED ORDER — AZITHROMYCIN 250 MG PO TABS: ORAL_TABLET | ORAL | Status: DC

## 2012-05-15 MED ORDER — INSULIN ASPART 100 UNIT/ML ~~LOC~~ SOLN
15.0000 [IU] | Freq: Once | SUBCUTANEOUS | Status: AC
  Administered 2012-05-15: 15 [IU] via SUBCUTANEOUS
  Filled 2012-05-15: qty 1

## 2012-05-15 MED ORDER — ALBUTEROL SULFATE HFA 108 (90 BASE) MCG/ACT IN AERS: 1.0000 | INHALATION_SPRAY | Freq: Four times a day (QID) | RESPIRATORY_TRACT | Status: DC | PRN

## 2012-05-15 MED ORDER — ALBUTEROL SULFATE (5 MG/ML) 0.5% IN NEBU
5.0000 mg | INHALATION_SOLUTION | Freq: Once | RESPIRATORY_TRACT | Status: AC
  Administered 2012-05-15: 5 mg via RESPIRATORY_TRACT
  Filled 2012-05-15: qty 1

## 2012-05-15 MED ORDER — BENZONATATE 100 MG PO CAPS: 100.0000 mg | ORAL_CAPSULE | Freq: Three times a day (TID) | ORAL | Status: DC

## 2012-05-15 NOTE — ED Notes (Signed)
Pt states abdominal bloat. Chest pressure when he lays down, scratchy throat.

## 2012-05-15 NOTE — ED Notes (Signed)
Pt returned from xray

## 2012-05-15 NOTE — ED Notes (Signed)
Xray notified pt ready for transport

## 2012-05-15 NOTE — ED Notes (Signed)
Pt comfortable with d/c and f/u instructions. Prescriptions x3

## 2012-05-15 NOTE — ED Notes (Signed)
PT c/o chills. CBG rechecked. Pt mentating well.
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
## 2012-06-01 ENCOUNTER — Encounter: Payer: Self-pay | Admitting: Internal Medicine

## 2012-06-01 ENCOUNTER — Inpatient Hospital Stay (HOSPITAL_COMMUNITY)
Admission: AD | Admit: 2012-06-01 | Discharge: 2012-06-04 | DRG: 603 | Disposition: A | Discharge status: Home / Self Care | Payer: Medicaid Other | Source: Ambulatory Visit | Attending: Internal Medicine | Admitting: Internal Medicine

## 2012-06-01 ENCOUNTER — Ambulatory Visit (INDEPENDENT_AMBULATORY_CARE_PROVIDER_SITE_OTHER): Payer: Medicaid Other | Admitting: Internal Medicine

## 2012-06-01 ENCOUNTER — Encounter (HOSPITAL_COMMUNITY): Payer: Self-pay | Admitting: Internal Medicine

## 2012-06-01 VITALS — BP 158/81 | HR 117 | Temp 100.2°F | Ht 72.5 in | Wt 195.7 lb

## 2012-06-01 DIAGNOSIS — R059 Cough, unspecified: Secondary | ICD-10-CM

## 2012-06-01 DIAGNOSIS — E1169 Type 2 diabetes mellitus with other specified complication: Secondary | ICD-10-CM

## 2012-06-01 DIAGNOSIS — R05 Cough: Secondary | ICD-10-CM

## 2012-06-01 DIAGNOSIS — R053 Chronic cough: Secondary | ICD-10-CM | POA: Insufficient documentation

## 2012-06-01 DIAGNOSIS — L02619 Cutaneous abscess of unspecified foot: Principal | ICD-10-CM | POA: Diagnosis present

## 2012-06-01 DIAGNOSIS — I739 Peripheral vascular disease, unspecified: Secondary | ICD-10-CM

## 2012-06-01 DIAGNOSIS — J209 Acute bronchitis, unspecified: Secondary | ICD-10-CM | POA: Diagnosis present

## 2012-06-01 DIAGNOSIS — Z794 Long term (current) use of insulin: Secondary | ICD-10-CM

## 2012-06-01 DIAGNOSIS — IMO0002 Reserved for concepts with insufficient information to code with codable children: Secondary | ICD-10-CM

## 2012-06-01 DIAGNOSIS — E118 Type 2 diabetes mellitus with unspecified complications: Secondary | ICD-10-CM

## 2012-06-01 DIAGNOSIS — Z7982 Long term (current) use of aspirin: Secondary | ICD-10-CM

## 2012-06-01 DIAGNOSIS — Z95820 Peripheral vascular angioplasty status with implants and grafts: Secondary | ICD-10-CM

## 2012-06-01 DIAGNOSIS — Z87891 Personal history of nicotine dependence: Secondary | ICD-10-CM

## 2012-06-01 DIAGNOSIS — I798 Other disorders of arteries, arterioles and capillaries in diseases classified elsewhere: Secondary | ICD-10-CM | POA: Diagnosis present

## 2012-06-01 DIAGNOSIS — E1159 Type 2 diabetes mellitus with other circulatory complications: Secondary | ICD-10-CM | POA: Diagnosis present

## 2012-06-01 DIAGNOSIS — E1165 Type 2 diabetes mellitus with hyperglycemia: Secondary | ICD-10-CM

## 2012-06-01 DIAGNOSIS — L97519 Non-pressure chronic ulcer of other part of right foot with unspecified severity: Secondary | ICD-10-CM | POA: Insufficient documentation

## 2012-06-01 DIAGNOSIS — L97509 Non-pressure chronic ulcer of other part of unspecified foot with unspecified severity: Secondary | ICD-10-CM

## 2012-06-01 DIAGNOSIS — E11621 Type 2 diabetes mellitus with foot ulcer: Secondary | ICD-10-CM | POA: Diagnosis present

## 2012-06-01 DIAGNOSIS — E1142 Type 2 diabetes mellitus with diabetic polyneuropathy: Secondary | ICD-10-CM | POA: Diagnosis present

## 2012-06-01 DIAGNOSIS — L03039 Cellulitis of unspecified toe: Principal | ICD-10-CM | POA: Diagnosis present

## 2012-06-01 DIAGNOSIS — E785 Hyperlipidemia, unspecified: Secondary | ICD-10-CM | POA: Diagnosis present

## 2012-06-01 DIAGNOSIS — S98139A Complete traumatic amputation of one unspecified lesser toe, initial encounter: Secondary | ICD-10-CM

## 2012-06-01 DIAGNOSIS — E1149 Type 2 diabetes mellitus with other diabetic neurological complication: Secondary | ICD-10-CM | POA: Diagnosis present

## 2012-06-01 DIAGNOSIS — I10 Essential (primary) hypertension: Secondary | ICD-10-CM | POA: Diagnosis present

## 2012-06-01 HISTORY — DX: Gastro-esophageal reflux disease without esophagitis: K21.9

## 2012-06-01 LAB — CBC WITH DIFFERENTIAL/PLATELET
Basophils Absolute: 0 10*3/uL (ref 0.0–0.1)
Eosinophils Absolute: 0 10*3/uL (ref 0.0–0.7)
Eosinophils Relative: 0 % (ref 0–5)
MCH: 29.7 pg (ref 26.0–34.0)
MCHC: 33.8 g/dL (ref 30.0–36.0)
MCV: 87.7 fL (ref 78.0–100.0)
Platelets: 292 10*3/uL (ref 150–400)
RDW: 12.9 % (ref 11.5–15.5)

## 2012-06-01 LAB — CREATININE, SERUM
Creatinine, Ser: 0.99 mg/dL (ref 0.50–1.35)
GFR calc non Af Amer: >90 mL/min (ref >90)

## 2012-06-01 LAB — GLUCOSE, CAPILLARY: Glucose-Capillary: 397 mg/dL — ABNORMAL HIGH (ref 70–99)

## 2012-06-01 MED ORDER — SODIUM CHLORIDE 0.9 % IJ SOLN
3.0000 mL | INTRAMUSCULAR | Status: DC | PRN
  Administered 2012-06-04: 3 mL via INTRAVENOUS

## 2012-06-01 MED ORDER — SIMVASTATIN 20 MG PO TABS
20.0000 mg | ORAL_TABLET | Freq: Every day | ORAL | Status: DC
  Administered 2012-06-02: 20 mg via ORAL
  Filled 2012-06-01 (×2): qty 1

## 2012-06-01 MED ORDER — TRAMADOL HCL 50 MG PO TABS
50.0000 mg | ORAL_TABLET | Freq: Four times a day (QID) | ORAL | Status: DC | PRN
  Administered 2012-06-01 – 2012-06-03 (×3): 50 mg via ORAL
  Filled 2012-06-01 (×3): qty 1

## 2012-06-01 MED ORDER — BENZONATATE 100 MG PO CAPS
100.0000 mg | ORAL_CAPSULE | Freq: Three times a day (TID) | ORAL | Status: DC
Start: 2012-06-01 — End: 2012-06-01
  Filled 2012-06-01 (×2): qty 1

## 2012-06-01 MED ORDER — BENZONATATE 100 MG PO CAPS
100.0000 mg | ORAL_CAPSULE | Freq: Three times a day (TID) | ORAL | Status: DC | PRN
  Administered 2012-06-01 – 2012-06-03 (×5): 100 mg via ORAL
  Filled 2012-06-01 (×4): qty 1

## 2012-06-01 MED ORDER — INSULIN ASPART 100 UNIT/ML ~~LOC~~ SOLN
20.0000 [IU] | Freq: Three times a day (TID) | SUBCUTANEOUS | Status: DC
  Administered 2012-06-02 – 2012-06-04 (×8): 20 [IU] via SUBCUTANEOUS

## 2012-06-01 MED ORDER — SODIUM CHLORIDE 0.9 % IJ SOLN
3.0000 mL | Freq: Two times a day (BID) | INTRAMUSCULAR | Status: DC
  Administered 2012-06-02 – 2012-06-04 (×3): 3 mL via INTRAVENOUS

## 2012-06-01 MED ORDER — ALBUTEROL SULFATE HFA 108 (90 BASE) MCG/ACT IN AERS
1.0000 | INHALATION_SPRAY | Freq: Four times a day (QID) | RESPIRATORY_TRACT | Status: DC | PRN
  Filled 2012-06-01: qty 6.7

## 2012-06-01 MED ORDER — CLINDAMYCIN PHOSPHATE 600 MG/50ML IV SOLN
600.0000 mg | Freq: Three times a day (TID) | INTRAVENOUS | Status: DC
  Administered 2012-06-01 – 2012-06-03 (×5): 600 mg via INTRAVENOUS
  Filled 2012-06-01 (×7): qty 50

## 2012-06-01 MED ORDER — INSULIN GLARGINE 100 UNIT/ML ~~LOC~~ SOLN
65.0000 [IU] | Freq: Every day | SUBCUTANEOUS | Status: DC
  Administered 2012-06-01: 65 [IU] via SUBCUTANEOUS
  Filled 2012-06-01 (×2): qty 0.65

## 2012-06-01 MED ORDER — GABAPENTIN 300 MG PO CAPS
600.0000 mg | ORAL_CAPSULE | Freq: Three times a day (TID) | ORAL | Status: DC
  Administered 2012-06-01 – 2012-06-04 (×8): 600 mg via ORAL
  Filled 2012-06-01: qty 1
  Filled 2012-06-01: qty 2
  Filled 2012-06-01: qty 1
  Filled 2012-06-01 (×11): qty 2

## 2012-06-01 MED ORDER — ENOXAPARIN SODIUM 40 MG/0.4ML ~~LOC~~ SOLN
40.0000 mg | SUBCUTANEOUS | Status: DC
  Administered 2012-06-01 – 2012-06-03 (×3): 40 mg via SUBCUTANEOUS
  Filled 2012-06-01 (×5): qty 0.4

## 2012-06-01 MED ORDER — LISINOPRIL 40 MG PO TABS
40.0000 mg | ORAL_TABLET | Freq: Every day | ORAL | Status: DC
Start: 2012-06-02 — End: 2012-06-04
  Administered 2012-06-02 – 2012-06-04 (×3): 40 mg via ORAL
  Filled 2012-06-01 (×3): qty 1

## 2012-06-01 MED ORDER — DEXTROSE 5 % IV SOLN
2.0000 g | Freq: Two times a day (BID) | INTRAVENOUS | Status: DC
  Administered 2012-06-01 – 2012-06-03 (×4): 2 g via INTRAVENOUS
  Filled 2012-06-01 (×6): qty 2

## 2012-06-01 MED ORDER — PANTOPRAZOLE SODIUM 40 MG PO TBEC
40.0000 mg | DELAYED_RELEASE_TABLET | Freq: Every day | ORAL | Status: DC
  Administered 2012-06-02 – 2012-06-03 (×2): 40 mg via ORAL
  Filled 2012-06-01 (×2): qty 1

## 2012-06-01 MED ORDER — SODIUM CHLORIDE 0.9 % IV SOLN: 250.0000 mL | INTRAVENOUS | Status: DC | PRN

## 2012-06-01 MED ORDER — ASPIRIN EC 325 MG PO TBEC
325.0000 mg | DELAYED_RELEASE_TABLET | Freq: Every day | ORAL | Status: DC
Start: 2012-06-02 — End: 2012-06-04
  Administered 2012-06-02 – 2012-06-04 (×3): 325 mg via ORAL
  Filled 2012-06-01 (×3): qty 1

## 2012-06-01 MED ORDER — INSULIN ASPART 100 UNIT/ML ~~LOC~~ SOLN
0.0000 [IU] | Freq: Three times a day (TID) | SUBCUTANEOUS | Status: DC
  Administered 2012-06-02 (×2): 7 [IU] via SUBCUTANEOUS
  Administered 2012-06-02 – 2012-06-03 (×2): 5 [IU] via SUBCUTANEOUS

## 2012-06-01 NOTE — Progress Notes (Signed)
1742PM - Report called to Carol,RN on 6N.

## 2012-06-01 NOTE — Assessment & Plan Note (Signed)
Likely bronchitis. Unlikely if he has pneumonia. No productive sputum, fever, chest pain or short of breath. Chronic cough- more than 4-5 weeks. Can consider GERD.patient denies sinusitis or post nasal drip. Does not have asthma.    - Cough Suppression for symptomatic management. - might consider getting chest x-ray if felt needed.

## 2012-06-01 NOTE — Assessment & Plan Note (Signed)
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 Sharol Given.  - New mid foot plantar ulcer- status post prolonged walking. - Will likely be okay with antibiotics but needs orthopedic evaluation also.

## 2012-06-01 NOTE — Assessment & Plan Note (Signed)
Last A1c 10.6 in December 2013. - Will repeat A1c and will likely need sliding scale insulin while in hospital.
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
## 2012-06-01 NOTE — Assessment & Plan Note (Signed)
Right great toe diabetic foot ulcer.  Good DP pulses.  - Admit to MedSurg bed for possible IV antibiotics and orthopedics consult.
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
## 2012-06-02 LAB — BASIC METABOLIC PANEL
BUN: 13 mg/dL (ref 6–23)
Calcium: 8.7 mg/dL (ref 8.4–10.5)
Creatinine, Ser: 1.07 mg/dL (ref 0.50–1.35)
GFR calc Af Amer: >90 mL/min (ref >90)
GFR calc non Af Amer: 78 mL/min — ABNORMAL LOW (ref >90)
Glucose, Bld: 335 mg/dL — ABNORMAL HIGH (ref 70–99)
Potassium: 3.5 mEq/L (ref 3.5–5.1)

## 2012-06-02 LAB — CBC
Hemoglobin: 10.3 g/dL — ABNORMAL LOW (ref 13.0–17.0)
MCH: 30 pg (ref 26.0–34.0)
MCHC: 34.2 g/dL (ref 30.0–36.0)
Platelets: 240 10*3/uL (ref 150–400)
RBC: 3.43 MIL/uL — ABNORMAL LOW (ref 4.22–5.81)

## 2012-06-02 LAB — GLUCOSE, CAPILLARY
Glucose-Capillary: 259 mg/dL — ABNORMAL HIGH (ref 70–99)
Glucose-Capillary: 313 mg/dL — ABNORMAL HIGH (ref 70–99)

## 2012-06-02 MED ORDER — INSULIN GLARGINE 100 UNIT/ML ~~LOC~~ SOLN
70.0000 [IU] | Freq: Every day | SUBCUTANEOUS | Status: DC
  Administered 2012-06-02 – 2012-06-03 (×2): 70 [IU] via SUBCUTANEOUS
  Filled 2012-06-02 (×4): qty 0.7

## 2012-06-02 MED ORDER — DILTIAZEM HCL 60 MG PO TABS
60.0000 mg | ORAL_TABLET | Freq: Two times a day (BID) | ORAL | Status: DC
  Administered 2012-06-02 – 2012-06-04 (×5): 60 mg via ORAL
  Filled 2012-06-02 (×6): qty 1

## 2012-06-02 NOTE — Consult Note (Signed)
WOC consult Note Reason for Consult:evaluate neuropathic ulcerations of the bilateral feet. He has history of PVD, with stenting a few years ago. ABIs from a study dated 11/30/2011. Right ABI 0.84, left ABI 0.68. He has transmet amputation on the left foot.  He has been followed in the past by Dr. Jerline Pain at the wound care center here at [facility]. Reports walking a long distance and noticing wounds after this.  He has a 5th toe amputation on the right foot from [place] and with his DM this wound worsened.  Wound type:Neuropathic foot ulcerations of the left lateral foot and the right great toe.  Measurement: Right great toe yellow hard eschar, with one distal area that is open with some slough in the center. The toe tip is 2.5cm x 3.0cm x 0, the distal area is 1.0cm x 1.5cm x 0.2cm.   Left lateral foot: initially a 0.5cm x 0.5cm area assessed but upon further assessment this area can be probed circumferentially to reveal callous detracted skin, appear may have initially had a blister and now this has detatched from the wound bed. Conservative sharp wound debridement (CSWD performed at the bedside)-area prepped with 2 betadine swabs prior to removal of the non viable tissue circumferentially.  After debridement: 4.0cm x 3.5cm x 0.2cm Wound bed: see above for right toe and distal foot ulcer.  Left lateral ulcer 75% pink,moist. 25% yellow slough and some darkening of the tissue centrally. Drainage (amount, consistency, odor) moderate from the distal right foot ulcer and moderate from the left lateral foot wound with some purulent drainage from the left foot, however after debridement the collections of pus under the skin were cleansed and the wound appears to have less collection of drainage now. Periwound: both intact, some edema of the right great toe. Dressing procedure/placement/frequency: Silver hydrofiber for the left lateral foot, cover with dry dressing, wrap with conform  and change daily.  Paint right great toe with betadine daily, and apply silver hydrofiber to the right foot distal wound, cover with dry dressing and change daily. Offloading shoe for the left foot.   Will need routine wound care follow up at time of discharge. Would recommend setting the patient back up with [name], since he has seen Dr. Jerline Pain there already.  WOC will follow along with you for wound progress.  Jalan Bodi Liane Comber RN,CWOCN [id number]    A

## 2012-06-02 NOTE — Progress Notes (Signed)
Orthopedic Tech Progress Note Patient Details:  [name] 20-Jul-1960 [id number]  Patient ID: [name], male   DOB: 01/19/1960, 52 y.o.   MRN: [id number] Brace order completed by Warnell Forester, Keilen Kahl 06/02/2012, 4:11 PM

## 2012-06-02 NOTE — Progress Notes (Signed)
Case discussed with Dr. Posey Pronto soon after the resident saw the patient.  We reviewed the resident's history and exam and pertinent patient test results.  I agree with the assessment, diagnosis and plan of care documented in the resident's note.
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
## 2012-06-02 NOTE — Progress Notes (Signed)
UR COMPLETED  

## 2012-06-02 NOTE — Progress Notes (Signed)
Subjective:    Interval Events:  Patient feels better.  Fevers/chills better.  Cough persists but not bad.  No CP, SOB, abdominal pain, or dysuria.    Objective:    Vital Signs:   Temp:  [98.2 F-100.2 F] 98.2 F (36.8 C) (05/30 0545) Pulse Rate:  [101-117] 101 (05/30 0545) Resp:  [17-20] 17 (05/29 2112) BP: (136-179)/(72-85) 136/78 mmHg (05/30 0545) SpO2:  [96 %-100 %] 97 % (05/30 0545)   Weights: Filed Weights   06/01/12 1754 06/02/12 0603  Weight: 195 lb 12.8 oz (88.814 kg) 194 lb 8 oz (88.225 kg)    Intake/Output:   Gross per 24 hour  Intake    603 ml  Output      0 ml  Net    603 ml   Last BM Date: 05/31/12   Physical Exam: GENERAL: alert and oriented; resting comfortably in bed and in no distress EYES: pupils equal, round, and reactive to light; sclera anicteric ENT: moist mucosa LUNGS: clear to auscultation bilaterally, normal work of breathing HEART: normal rate; regular rhythm; normal S1 and S2, no S3 or S4 appreciated; no murmurs, rubs, or clicks ABDOMEN: soft, non-tender, normal bowel sounds, no masses palpated EXTREMITIES: no edema SKIN: normal turgor    Labs: Basic Metabolic Panel: Lab [id number] 06/02/12 0635  NA  --  130*  K  --  3.5  CL  --  93*  CO2  --  29  GLUCOSE  --  335*  BUN  --  13  CREATININE 0.99 1.07  CALCIUM  --  8.7    CBC: Lab 06/01/12 1632 06/02/12 0625  WBC 12.0* 10.2  NEUTROABS 9.8*  --   HGB 11.1* 10.3*  HCT 32.8* 30.1*  MCV 87.7 87.8  PLT 292 240    CBG: Lab 06/01/12 1533 06/01/12 2135 06/02/12 0739  GLUCAP 274* 397* 303*    Imaging: No results found.    Medications:    Infusions:     Scheduled Medications: . aspirin  325 mg Oral Daily  . ceFEPime (MAXIPIME) IV  2 g Intravenous Q12H  . clindamycin (CLEOCIN) IV  600 mg Intravenous TID  . enoxaparin (LOVENOX) injection  40 mg Subcutaneous Q24H  . gabapentin  600 mg Oral TID  . insulin aspart  0-9 Units Subcutaneous TID WC  .  insulin aspart  20 Units Subcutaneous TID AC  . insulin glargine  65 Units Subcutaneous QHS  . lisinopril  40 mg Oral Daily  . pantoprazole  40 mg Oral Q1200  . simvastatin  20 mg Oral q1800  . sodium chloride  3 mL Intravenous Q12H     PRN Medications: sodium chloride, albuterol, benzonatate, sodium chloride, traMADol    Assessment/ Plan:    1.   Right first toe cellulitis and ulcer:  We do not suspect a deeper infection at this time. It appears superficial. We will continue antimicrobial therapy with IV cefepime and clindamycin to cover MRSA, Streptococcus pyogenes, Pseudomonas aeruginosa, and anaerobic pathogens.  Blood cultures pending. WBC trending down with IV antibiotics. Afebrile since admission. - Cefepime per pharmacy  - Clindamycin 600 mg IV 3 times a day  - Wound care consult  - Blood cultures pending  2.   Left foot diabetic ulcer:  Appears uninfected. Wound care consult for routine care.  - Wound care consult   3.   Acute bronchitis:  Patient had symptoms of congestion and productive cough several weeks ago. He now has a nonproductive cough  that has been persistent for 2-3 weeks. Most likely postinfectious cough from a resolving acute bronchitis. Previous chest x-ray from 05/15/2012 is unremarkable.  - Continue benzonatate 100mg  TID PRN  - Albuterol MDI 1-2 puffs q6 hours PRN   4.   Type 2 diabetes mellitus, uncontrolled:  At home, he takes insulin aspart 20 units 3 times a day with meals and insulin glargine 65 units at bedtime. His hemoglobin A1c was 12.9.  Blood sugars are still increased (303 this AM). - Continue sensitive scale correctional insulin aspart with meals  - Continue 20 units of insulin aspart meal coverage - Increase insulin glargine from 65U to 70U at bedtime   5.   Hypertension:  At home patient takes lisinopril 40 mg daily. Seems to be uncontrolled with blood pressures over the last year ranging 202-131/114-62. Microalbumin-to-creatinine ratio was  407.8 on 04/16/2010. We will start diltiazem to target proteinuria as well as blood pressure. - Continue lisinopril 40 mg daily  - Start diltiazem 60 mg BID in the morning   6.   Peripheral vascular disease:  Patient does have distal pulses on exam today but has documented decreased ABIs from a study dated 11/30/2011. Right ABI 0.84, left ABI 0.68.  These ABIs are before stenting.  This is complicating the diabetic ulcers on his feet. He is on aspirin 325 mg daily and simvastatin 20 mg daily.  - Continue aspirin 325 mg daily  - Continue simvastatin 20 mg daily  7.   Prophylaxis:  Enoxaparin 40 mg [place] QD   8.   Disposition:  OPC patient.  Expected discharge date is tomorrow or day after.  Needs follow-up appt made.    Length of Stay: 1 days   Signed by:  Shann Medal. Juleen China, MD PGY-I, Internal Medicine Pager [phone]  06/02/2012, 11:02 AM

## 2012-06-02 NOTE — Progress Notes (Signed)
Orthopedic Tech Progress Note Patient Details:  [name] 10-29-1960 [id number] Wound care nurse called inquiring about an offloading boot for patient. It was decided that the best course of action would be to have [employer] come to custom fit offloading boot for patient's left foot. [employer] called to place order.  Patient ID: [name], male   DOB: February 26, 1960, 52 y.o.   MRN: [id number]   [name] 06/02/2012, 1:05 PM

## 2012-06-02 NOTE — Progress Notes (Addendum)
Inpatient Diabetes Program Recommendations  AACE/ADA: New Consensus Statement on Inpatient Glycemic Control (2013)  Target Ranges:  Prepandial:   less than 140 mg/dL      Peak postprandial:   less than 180 mg/dL (1-2 hours)      Critically ill patients:  140 - 180 mg/dL   Hyperglycemia in presence of foot ulcer.  Inpatient Diabetes Program Recommendations Correction (SSI): Please increase to moderate tidwc and add HS scale Please also check HgbA1C  Thank you, Rosita Kea, RN, CNS, Diabetes Coordinator [phone])

## 2012-06-03 DIAGNOSIS — Z9889 Other specified postprocedural states: Secondary | ICD-10-CM

## 2012-06-03 LAB — BASIC METABOLIC PANEL
Calcium: 8.7 mg/dL (ref 8.4–10.5)
Creatinine, Ser: 1.19 mg/dL (ref 0.50–1.35)
GFR calc Af Amer: 80 mL/min — ABNORMAL LOW (ref >90)
Sodium: 132 mEq/L — ABNORMAL LOW (ref 135–145)

## 2012-06-03 LAB — CBC
Platelets: 271 10*3/uL (ref 150–400)
RBC: 3.32 MIL/uL — ABNORMAL LOW (ref 4.22–5.81)
RDW: 12.3 % (ref 11.5–15.5)
WBC: 8.6 10*3/uL (ref 4.0–10.5)

## 2012-06-03 LAB — HEMOGLOBIN A1C: Hgb A1c MFr Bld: 13.8 % — ABNORMAL HIGH (ref <5.7)

## 2012-06-03 LAB — GLUCOSE, CAPILLARY
Glucose-Capillary: 285 mg/dL — ABNORMAL HIGH (ref 70–99)
Glucose-Capillary: 250 mg/dL — ABNORMAL HIGH (ref 70–99)

## 2012-06-03 MED ORDER — INSULIN ASPART 100 UNIT/ML ~~LOC~~ SOLN: 0.0000 [IU] | Freq: Every day | SUBCUTANEOUS | Status: DC

## 2012-06-03 MED ORDER — SULFAMETHOXAZOLE-TMP DS 800-160 MG PO TABS
2.0000 | ORAL_TABLET | Freq: Two times a day (BID) | ORAL | Status: DC
  Administered 2012-06-04 (×2): 2 via ORAL
  Filled 2012-06-03 (×3): qty 2

## 2012-06-03 MED ORDER — INSULIN ASPART 100 UNIT/ML ~~LOC~~ SOLN
0.0000 [IU] | Freq: Three times a day (TID) | SUBCUTANEOUS | Status: DC
  Administered 2012-06-03 – 2012-06-04 (×3): 5 [IU] via SUBCUTANEOUS

## 2012-06-03 MED ORDER — CLINDAMYCIN HCL 300 MG PO CAPS
300.0000 mg | ORAL_CAPSULE | Freq: Four times a day (QID) | ORAL | Status: DC
  Administered 2012-06-04 (×3): 300 mg via ORAL
  Filled 2012-06-03 (×8): qty 1

## 2012-06-03 MED ORDER — CLINDAMYCIN HCL 300 MG PO CAPS: 300.0000 mg | ORAL_CAPSULE | Freq: Four times a day (QID) | ORAL | Status: DC

## 2012-06-03 MED ORDER — PRAVASTATIN SODIUM 40 MG PO TABS
40.0000 mg | ORAL_TABLET | Freq: Every day | ORAL | Status: DC
  Filled 2012-06-03 (×2): qty 1

## 2012-06-03 NOTE — Progress Notes (Signed)
Subjective:    Interval Events:  Patient feels better with less pain in the feet. He denies fevers or chills overnight. He states that the cough is occasionally present but improving. No chest pain or SOB. No nausea or vomiting. NO diarrhea or abdominal pain. No dysuria or frequency. Sugars under reasonable control and advised tight control while working with this wound. HE is a non-smoker and advised him to stay away from tobacco in the future which he agrees with.     Objective:    Vital Signs:   Filed Vitals:   06/02/12 1753 06/02/12 2254 06/03/12 0635 06/03/12 1053  BP: 147/82 121/68 124/64 130/62  Pulse: 100 98 96   Temp: 97.4 F (36.3 C) 99.1 F (37.3 C) 97.7 F (36.5 C)   TempSrc: Oral Oral Oral   Resp: 20 18 18    Height:      Weight:      SpO2: 100% 100% 100%     Weights: Filed Weights   06/01/12 1754 06/02/12 0603  Weight: 195 lb 12.8 oz (88.814 kg) 194 lb 8 oz (88.225 kg)   Last BM Date: 05/31/12   Physical Exam: GENERAL: alert and oriented; resting comfortably in bed and in no distress EYES: pupils equal, round, and reactive to light; sclera anicteric ENT: moist mucosa LUNGS: clear to auscultation bilaterally, normal work of breathing HEART: normal rate; regular rhythm; normal S1 and S2, no S3 or S4 appreciated; no murmurs, rubs, or clicks ABDOMEN: soft, non-tender, normal bowel sounds, no masses palpated EXTREMITIES: no edema, right foot has superficial ulcer on the top of the great toe, covered with some area stuck to bandage, also another area on the underneath of the foot which is the area which was blistered and debrided by wound care. Not much foul odor and minimal to no discharge. 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 Toe amputation well healed and no redness or erythema. SKIN: normal turgor    Labs: Basic Metabolic Panel: Lab [id number] 1930 06/02/12 0635    NA  --  130*  K  --  3.5  CL  --  93*  CO2  --  29  GLUCOSE  --  335*  BUN  --  13  CREATININE 0.99 1.07  CALCIUM  --  8.7    CBC: Lab 06/01/12 1632 06/02/12 0625  WBC 12.0* 10.2  NEUTROABS 9.8*  --   HGB 11.1* 10.3*  HCT 32.8* 30.1*  MCV 87.7 87.8  PLT 292 240    CBG: Lab 06/01/12 1533 06/01/12 2135 06/02/12 0739  GLUCAP 274* 397* 303*    Imaging: No results found.    Medications:    Infusions:     Scheduled Medications: . aspirin  325 mg Oral Daily  . ceFEPime (MAXIPIME) IV  2 g Intravenous Q12H  . clindamycin (CLEOCIN) IV  600 mg Intravenous TID  . diltiazem  60 mg Oral BID  . enoxaparin (LOVENOX) injection  40 mg Subcutaneous Q24H  . gabapentin  600 mg Oral TID  . insulin aspart  0-9 Units Subcutaneous TID WC  . insulin aspart  20 Units Subcutaneous TID AC  . insulin glargine  70 Units Subcutaneous QHS  . lisinopril  40 mg Oral Daily  . pantoprazole  40 mg Oral Q1200  . pravastatin  40 mg Oral q1800  . sodium chloride  3 mL Intravenous Q12H  PRN Medications: sodium chloride, albuterol, benzonatate, sodium chloride, traMADol    Assessment/ Plan:    1.   Right first toe cellulitis and ulcer:  We do not suspect a deeper infection at this time. It appears superficial. We will switch IV cepefime and IV clindamycin to oral therapy today. Blood cultures pending. WBC trending down with IV antibiotics. Afebrile since admission. - Cefepime per pharmacy and Clindamycin 600 mg IV 3 times a day STOPPED -STARTclindamycin 300 mg q 6 hours PO and bactrim DS 2 tabs BID - Wound care consult  - Blood cultures pending  2.   Left foot diabetic ulcer:  Appears uninfected. Wound care consult for routine care.  - Wound care consult   3.   Acute bronchitis:  Patient had symptoms of congestion and productive cough several weeks ago. He now has a nonproductive cough that has been persistent for 2-3 weeks. Most likely postinfectious cough from a resolving acute  bronchitis. Previous chest x-ray from 05/15/2012 is unremarkable.  - Continue benzonatate 100mg  TID PRN  - Albuterol MDI 1-2 puffs q6 hours PRN   4.   Type 2 diabetes mellitus, uncontrolled:  At home, he takes insulin aspart 20 units 3 times a day with meals and insulin glargine 65 units at bedtime. His hemoglobin A1c was 12.9.  Blood sugars are still increased (303 this AM). - Increase sensitive scale correctional insulin aspart with meals to moderate and add qhs coverage - Continue 20 units of insulin aspart meal coverage - insulin glargine 70U at bedtime   5.   Hypertension:  At home patient takes lisinopril 40 mg daily. Seems to be uncontrolled with blood pressures over the last year ranging 202-131/114-62. Microalbumin-to-creatinine ratio was 407.8 on 04/16/2010. We will start diltiazem to target proteinuria as well as blood pressure. - Continue lisinopril 40 mg daily  - Continue diltiazem 60 mg BID in the morning   6.   Peripheral vascular disease:  Patient does have distal pulses on exam today but has documented decreased ABIs from a study dated 11/30/2011. Right ABI 0.84, left ABI 0.68.  These ABIs are before stenting.  This is complicating the diabetic ulcers on his feet. He is on aspirin 325 mg daily and simvastatin 20 mg daily.  - Continue aspirin 325 mg daily  - Continue simvastatin 20 mg daily  7.   Prophylaxis:  Enoxaparin 40 mg [place] QD   8.   Disposition:  OPC patient.  Expected discharge date is tomorrow.  Needs follow-up appt made.   Length of Stay: 2 days  Signed by:  Vertell Novak, MD PGY-2, Internal Medicine Pager [phone]  06/03/2012, 12:19 PM

## 2012-06-04 LAB — GLUCOSE, CAPILLARY: Glucose-Capillary: 228 mg/dL — ABNORMAL HIGH (ref 70–99)

## 2012-06-04 MED ORDER — DILTIAZEM HCL 60 MG PO TABS: 60.0000 mg | ORAL_TABLET | Freq: Two times a day (BID) | ORAL | Status: DC

## 2012-06-04 MED ORDER — SULFAMETHOXAZOLE-TMP DS 800-160 MG PO TABS: 2.0000 | ORAL_TABLET | Freq: Two times a day (BID) | ORAL | Status: DC

## 2012-06-04 MED ORDER — INSULIN GLARGINE 100 UNIT/ML ~~LOC~~ SOLN: 70.0000 [IU] | Freq: Every day | SUBCUTANEOUS | Status: DC

## 2012-06-04 MED ORDER — CLINDAMYCIN HCL 300 MG PO CAPS: 300.0000 mg | ORAL_CAPSULE | Freq: Four times a day (QID) | ORAL | Status: DC

## 2012-06-04 MED ORDER — SENNOSIDES-DOCUSATE SODIUM 8.6-50 MG PO TABS
2.0000 | ORAL_TABLET | Freq: Once | ORAL | Status: AC
  Administered 2012-06-04: 2 via ORAL
  Filled 2012-06-04: qty 2

## 2012-06-04 NOTE — Progress Notes (Signed)
Subjective:    Interval Events:  Patient feels better with less pain in the feet. He denies fevers or chills overnight. No chest pain or SOB. No nausea or vomiting. NO diarrhea or abdominal pain. No dysuria or frequency. Sugars under reasonable control and advised tight control while working with this wound. HE is a non-smoker and advised him to stay away from tobacco in the future which he agrees with. Advised close follow up with [place] and wound care and will call with any discharge or fevers.    Objective:    Vital Signs:   Filed Vitals:   06/03/12 1434 06/03/12 2131 06/04/12 0531 06/04/12 1000  BP: 142/79 148/77 139/71 139/75  Pulse: 94 105 99 94  Temp: 98.6 F (37 C) 99.1 F (37.3 C) 98.6 F (37 C) 98.6 F (37 C)  TempSrc: Oral Oral Oral Oral  Resp: 16 17 17 18   Height:      Weight:   206 lb 1.6 oz (93.486 kg)   SpO2: 100% 100% 100% 99%    Weights: [employer]   06/02/12 0603 06/03/12 0635 06/04/12 0531  Weight: 194 lb 8 oz (88.225 kg) 206 lb 1.6 oz (93.486 kg) 206 lb 1.6 oz (93.486 kg)   Last BM Date: 05/31/12   Physical Exam: GENERAL: alert and oriented; resting comfortably in bed and in no distress EYES: pupils equal, round, and reactive to light; sclera anicteric ENT: moist mucosa LUNGS: clear to auscultation bilaterally, normal work of breathing HEART: normal rate; regular rhythm; normal S1 and S2, no S3 or S4 appreciated; no murmurs, rubs, or clicks ABDOMEN: soft, non-tender, normal bowel sounds, no masses palpated EXTREMITIES: no edema, right foot has superficial ulcer on the top of the great toe, covered with some area stuck to bandage, also another area on the underneath of the foot which is the area which was blistered and debrided by wound care. Not much foul odor and minimal to no discharge. 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 Toe amputation well healed and no  redness or erythema. There is some granulation tissue around the edges of the wound on the right foot which does not express any discharge. Feet are warm to touch indicating good blood flow.  SKIN: normal turgor    Labs: Basic Metabolic Panel: Lab [id number] 1930 06/02/12 0635  NA  --  130*  K  --  3.5  CL  --  93*  CO2  --  29  GLUCOSE  --  335*  BUN  --  13  CREATININE 0.99 1.07  CALCIUM  --  8.7    CBC: Lab 06/01/12 1632 06/02/12 0625  WBC 12.0* 10.2  NEUTROABS 9.8*  --   HGB 11.1* 10.3*  HCT 32.8* 30.1*  MCV 87.7 87.8  PLT 292 240    CBG: Lab 06/01/12 1533 06/01/12 2135 06/02/12 0739  GLUCAP 274* 397* 303*    Imaging: No results found.    Medications:    Infusions:     Scheduled Medications: . aspirin  325 mg Oral Daily  . clindamycin  300 mg Oral Q6H  . diltiazem  60 mg Oral BID  . enoxaparin (LOVENOX) injection  40 mg Subcutaneous Q24H  . gabapentin  600 mg Oral TID  . insulin aspart  0-15 Units Subcutaneous TID WC  . insulin aspart  0-5 Units Subcutaneous QHS  . insulin aspart  20 Units Subcutaneous TID AC  . insulin glargine  70 Units Subcutaneous QHS  . lisinopril  40 mg Oral Daily  . pantoprazole  40 mg Oral Q1200  . pravastatin  40 mg Oral q1800  . senna-docusate  2 tablet Oral Once  . sodium chloride  3 mL Intravenous Q12H  . sulfamethoxazole-trimethoprim  2 tablet Oral Q12H     PRN Medications: sodium chloride, albuterol, benzonatate, sodium chloride, traMADol    Assessment/ Plan:    1.   Right first toe cellulitis and ulcer:  We do not suspect a deeper infection at this time. It appears superficial. Doing well on oral therapy today. Blood cultures pending. Afebrile since admission. -STARTclindamycin 300 mg q 6 hours PO and bactrim DS 2 tabs BID - Wound care consult  - Blood cultures pending  2.   Left foot diabetic ulcer:  Appears uninfected. Wound care consult for routine care.  - Wound care consult   3.   Acute  bronchitis:  Patient had symptoms of congestion and productive cough several weeks ago. He now has a nonproductive cough that has been persistent for 2-3 weeks. Most likely postinfectious cough from a resolving acute bronchitis. Previous chest x-ray from 05/15/2012 is unremarkable.  - Continue benzonatate 100mg  TID PRN  - Albuterol MDI 1-2 puffs q6 hours PRN   4.   Type 2 diabetes mellitus, uncontrolled:  At home, he takes insulin aspart 20 units 3 times a day with meals and insulin glargine 65 units at bedtime. His hemoglobin A1c was 12.9.  Blood sugars are still increased (303 this AM). - Increase sensitive scale correctional insulin aspart with meals to moderate and add qhs coverage - Continue 20 units of insulin aspart meal coverage - insulin glargine 70U at bedtime   5.   Hypertension:  At home patient takes lisinopril 40 mg daily. Seems to be uncontrolled with blood pressures over the last year ranging 202-131/114-62. Microalbumin-to-creatinine ratio was 407.8 on 04/16/2010. We will start diltiazem to target proteinuria as well as blood pressure. - Continue lisinopril 40 mg daily  - Continue diltiazem 60 mg BID in the morning   6.   Peripheral vascular disease:  Patient does have distal pulses on exam today but has documented decreased ABIs from a study dated 11/30/2011. Right ABI 0.84, left ABI 0.68.  These ABIs are before stenting.  This is complicating the diabetic ulcers on his feet. He is on aspirin 325 mg daily and simvastatin 20 mg daily.  - Continue aspirin 325 mg daily  - Continue simvastatin 20 mg daily  7.   Prophylaxis:  Enoxaparin 40 mg [place] QD   8.   Disposition:  OPC patient.  Expected discharge date is today.  Clinic will call with follow up appointment.   Length of Stay: 3 days  Signed by: Vertell Novak, MD PGY-2, Internal Medicine Pager [phone] 06/04/2012, 11:11 AM
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
## 2012-06-08 LAB — CULTURE, BLOOD (ROUTINE X 2): Culture: NO GROWTH

## 2012-06-09 ENCOUNTER — Encounter: Payer: Self-pay | Admitting: Internal Medicine

## 2012-06-09 ENCOUNTER — Ambulatory Visit (INDEPENDENT_AMBULATORY_CARE_PROVIDER_SITE_OTHER): Payer: Medicaid Other | Admitting: Internal Medicine

## 2012-06-09 VITALS — BP 136/82 | HR 104 | Temp 98.7°F | Ht 72.0 in | Wt 196.6 lb

## 2012-06-09 DIAGNOSIS — E11621 Type 2 diabetes mellitus with foot ulcer: Secondary | ICD-10-CM

## 2012-06-09 DIAGNOSIS — E1165 Type 2 diabetes mellitus with hyperglycemia: Secondary | ICD-10-CM

## 2012-06-09 DIAGNOSIS — I10 Essential (primary) hypertension: Secondary | ICD-10-CM

## 2012-06-09 DIAGNOSIS — E1169 Type 2 diabetes mellitus with other specified complication: Secondary | ICD-10-CM

## 2012-06-09 DIAGNOSIS — I739 Peripheral vascular disease, unspecified: Secondary | ICD-10-CM

## 2012-06-09 DIAGNOSIS — L97509 Non-pressure chronic ulcer of other part of unspecified foot with unspecified severity: Secondary | ICD-10-CM

## 2012-06-09 MED ORDER — "AQUACEL-AG HYDROFIBER 4""X4.7"" EX PADS": 1.0000 "application " | MEDICATED_PAD | Freq: Every day | CUTANEOUS | Status: DC

## 2012-06-09 MED ORDER — GLUCOSE BLOOD VI STRP: ORAL_STRIP | Status: DC

## 2012-06-09 NOTE — Assessment & Plan Note (Signed)
The patient continues to have bilateral diabetic foot ulcers, which seem unchanged since hospital discharge.  Patient has not yet scheduled his wound care follow-up appointment.  He notes that he is still weight bearing with his current shoe. -will call wound care center to schedule follow-up appointment -prescribed Aquacel Ag today, apply daily -we called Orthopedics while the patient was here, who came to the patient's room and outfitted him with a new non-weight bearing shoe.

## 2012-06-09 NOTE — Progress Notes (Signed)
HPI The patient is a 52 y.o. male with a history of DM, HTN, PVD, and frequent diabetic foot ulcers, presenting for a hospital follow-up.  The patient was recently hospitalized 5/29-6/1 for a diabetic foot ulcer, treated with cefepime and clinda, which was transitioned to bactrim and clinda at discharge, which the patient continues to take.  Since discharge, the patient notes that the wounds are more or less unchanged.  He believes he has had some additional superficial skin loss surrounding his left ulcer.  The patient has been treating the area with dry bandages, changed daily.  He notes that he has ussed aquacel Ag in the past, which has helped, though he is out of this now.  The patient has not followed up with the wound care center since hospital discharge.  The patient notes no fevers.  The patient notes pain from the feet, as well as clear-yellowish drainage.   The patient has a history of DM.  His last A1C was 13.8.  He reports that he lost his glucometer during his last hospitalization, and thus has not been checking his blood sugars at home.  He notes no episodes of tremulousness, diaphoresis, LH, or AMS.  Also no blurry vision, polyuria, or polydipsia.  The patient has a history of PVD, s/p LE stent.  The patient used to follow with Dr. [name], but has not been back to see him in years.  He requests a follow-up appointment to continue to evaluate his PVD.  ROS: General: no fevers, chills, changes in weight, changes in appetite Skin: see HPI HEENT: no blurry vision, hearing changes, sore throat Pulm: no dyspnea, coughing, wheezing CV: no chest pain, palpitations, shortness of breath Abd: no abdominal pain, nausea/vomiting, diarrhea/constipation GU: no dysuria, hematuria, polyuria Ext: see HPI Neuro: no weakness, numbness, or tingling  Filed Vitals:   06/09/12 1029  BP: 136/82  Pulse:   Temp:     PEX General: alert, cooperative, and in no apparent distress HEENT: pupils equal  round and reactive to light, vision grossly intact, oropharynx clear and non-erythematous  Neck: supple, no lymphadenopathy Lungs: clear to ascultation bilaterally, normal work of respiration, no wheezes, rales, ronchi Heart: regular rate and rhythm, no murmurs, gallops, or rubs Abdomen: soft, non-tender, non-distended, normal bowel sounds Extremities: left foot with large ulcer at his lateral mid-foot, with yellowish exudate, but no surrounding erythema.  Right 1st toe with smaller ulcer with yellowish exudate, and no surrounding erythema Neurologic: alert & oriented X3, cranial nerves II-XII intact, strength grossly intact, sensation intact to light touch  Current Outpatient Prescriptions on File Prior to Visit  Medication Sig Dispense Refill  . albuterol (PROVENTIL HFA;VENTOLIN HFA) 108 (90 BASE) MCG/ACT inhaler Inhale 1-2 puffs into the lungs every 6 (six) hours as needed for wheezing.  1 Inhaler  0  . aspirin 325 MG EC tablet Take 325 mg by mouth daily.      . clindamycin (CLEOCIN) 300 MG capsule Take 1 capsule (300 mg total) by mouth every 6 (six) hours.  56 capsule  0  . diltiazem (CARDIZEM) 60 MG tablet Take 1 tablet (60 mg total) by mouth 2 (two) times daily.  60 tablet  0  . gabapentin (NEURONTIN) 300 MG capsule Take 2 capsules (600 mg total) by mouth 3 (three) times daily.  180 capsule  0  . insulin aspart (NOVOLOG) 100 UNIT/ML injection Inject 20 Units into the skin 3 (three) times daily before meals.      . insulin glargine (LANTUS) 100  UNIT/ML injection Inject 0.7 mLs (70 Units total) into the skin at bedtime.  10 mL  12  . lisinopril (PRINIVIL,ZESTRIL) 40 MG tablet Take 40 mg by mouth daily.      . metFORMIN (GLUCOPHAGE) 1000 MG tablet Take 1,000 mg by mouth 2 (two) times daily with a meal.      . omeprazole (PRILOSEC) 20 MG capsule Take 20 mg by mouth daily.      . pravastatin (PRAVACHOL) 40 MG tablet Take 40 mg by mouth daily.      [name] sulfamethoxazole-trimethoprim (BACTRIM DS)  800-160 MG per tablet Take 2 tablets by mouth every 12 (twelve) hours.  56 tablet  0  . traMADol (ULTRAM) 50 MG tablet Take 50 mg by mouth every 6 (six) hours as needed for pain.       No current facility-administered medications on file prior to visit.    Assessment/Plan

## 2012-06-09 NOTE — Assessment & Plan Note (Signed)
BP Readings from Last 3 Encounters:  06/09/12 136/82  06/04/12 139/75  06/01/12 158/81    Lab Results  Component Value Date   NA 132* 06/03/2012   K 3.8 06/03/2012   CREATININE 1.19 06/03/2012    Assessment: Blood pressure control: controlled Progress toward BP goal:  at goal Comments: BP well-controlled on re-check  Plan: Medications:  continue current medications Educational resources provided:   Self management tools provided:   Other plans: Repeat at next visit

## 2012-06-09 NOTE — Patient Instructions (Addendum)
General Instructions: For your foot ulcer: -we have prescribed Aquacel Ag.  Apply to your right and left foot ulcers, and change dressing daily. -we are referring you to the [facility].  We will contact you with an appointment -we are sizing you for a new non-weight bearing shoe today  For your diabetes: -we have given you a new glucometer today, and sent in test strips to your pharmacy.  Continue to check your blood sugar 3 times per day  For your Peripheral Vascular Disease -we are referring you to re-establish care with Dr. [name], who you have seen in the past  Please return for a follow-up visit in 2 weeks, and bring your glucometer, so that we can better control your diabetes.   Treatment Goals:  Goals (1 Years of Data) as of 06/09/12         As of Today As of Today 06/04/12 06/04/12 06/03/12     Blood Pressure    . Blood Pressure < 140/90  136/82 159/86 139/75 139/71 148/77     Result Component    . HEMOGLOBIN A1C < 7.0      13.8    . LDL CALC < 100            Progress Toward Treatment Goals:  Treatment Goal 06/09/2012  Hemoglobin A1C unable to assess  Blood pressure at goal    Self Care Goals & Plans:  Self Care Goal 06/01/2012  Manage my medications take my medicines as prescribed; bring my medications to every visit  Monitor my health keep track of my blood glucose; keep track of my weight; keep track of my blood pressure  Eat healthy foods eat baked foods instead of fried foods; drink diet soda or water instead of juice or soda  Be physically active (No Data)    Home Blood Glucose Monitoring 06/09/2012  Check my blood sugar no home glucose monitoring  When to check my blood sugar N/A     Care Management & Community Referrals:  Referral 06/09/2012  Referrals made for care management support other (see comment)  Referrals made to community resources -

## 2012-06-09 NOTE — Assessment & Plan Note (Signed)
Lab Results  Component Value Date   HGBA1C 13.8* 06/03/2012   HGBA1C 12.9 06/01/2012   HGBA1C 10.6 12/08/2011     Assessment: Diabetes control: poor control (HgbA1C >9%) Progress toward A1C goal:  unable to assess Comments: The patient has not checked his blood sugars since hospital discharge due to losing his glucometer.  We discussed that elevated blood sugars would impede wound healing.  Plan: Medications:  The patient was given a new glucometer in clinic (Accu-view Nano), with a prescription sent for test strips.  The patient will return in 2 weeks, so that we can review his glucometer and help him adjust his insulin regimen. Home glucose monitoring: Frequency: no home glucose monitoring Timing: N/A Instruction/counseling given: reminded to bring blood glucose meter & log to each visit Educational resources provided:   Self management tools provided:   Other plans: Return for follow-up visit in 2 weeks

## 2012-06-09 NOTE — Assessment & Plan Note (Signed)
The patient has a history of PVD, with stenting of the posterior tibialis and left SFA at that time on 12/2011 by Dr. [name].  The patient has not followed up with vascular surgery since that time. -will refer to [facility] to re-establish care for PVD

## 2012-06-12 ENCOUNTER — Other Ambulatory Visit: Payer: Self-pay | Admitting: *Deleted

## 2012-06-12 MED ORDER — METFORMIN HCL 1000 MG PO TABS: 1000.0000 mg | ORAL_TABLET | Freq: Two times a day (BID) | ORAL | Status: DC

## 2012-06-12 NOTE — Progress Notes (Signed)
TEACHING ATTENDING ADDENDUM: I discussed this case with Dr. Owens Shark at the time of the patient visit. I agree with the HPI, exam findings and have read the documentation provided by the resident,  and I concur with the plan of care. Please see the resident note for details of management.

## 2012-06-19 ENCOUNTER — Other Ambulatory Visit: Payer: Self-pay | Admitting: *Deleted

## 2012-06-19 MED ORDER — TRAMADOL HCL 50 MG PO TABS: 50.0000 mg | ORAL_TABLET | Freq: Four times a day (QID) | ORAL | Status: DC | PRN

## 2012-06-19 MED ORDER — GABAPENTIN 300 MG PO CAPS: 600.0000 mg | ORAL_CAPSULE | Freq: Three times a day (TID) | ORAL | Status: DC

## 2012-06-19 MED ORDER — "AQUACEL-AG HYDROFIBER 4""X4.7"" EX PADS": 1.0000 "application " | MEDICATED_PAD | Freq: Every day | CUTANEOUS | Status: DC

## 2012-06-19 NOTE — Telephone Encounter (Signed)
Pt only has two bactrim pills left. Also pharmacy does not carry aquacel  Pt # [phone]

## 2012-06-19 NOTE — Telephone Encounter (Signed)
I returned the patient's call.  Patient states he has been able to fill Aquacel at the [facility] in the past.  I've sent a prescription there.  The patient has completed his course of antibiotics, and does not need further antibiotics at this time.

## 2012-06-28 ENCOUNTER — Other Ambulatory Visit (HOSPITAL_BASED_OUTPATIENT_CLINIC_OR_DEPARTMENT_OTHER): Payer: Self-pay | Admitting: General Surgery

## 2012-06-28 ENCOUNTER — Ambulatory Visit: Payer: Medicaid Other | Admitting: Internal Medicine

## 2012-06-28 ENCOUNTER — Ambulatory Visit (HOSPITAL_COMMUNITY)
Admission: RE | Admit: 2012-06-28 | Discharge: 2012-06-28 | Disposition: A | Discharge status: Home / Self Care | Payer: Medicaid Other | Source: Ambulatory Visit | Attending: General Surgery | Admitting: General Surgery

## 2012-06-28 ENCOUNTER — Encounter (HOSPITAL_BASED_OUTPATIENT_CLINIC_OR_DEPARTMENT_OTHER): Payer: Medicaid Other | Attending: General Surgery

## 2012-06-28 DIAGNOSIS — S98119A Complete traumatic amputation of unspecified great toe, initial encounter: Secondary | ICD-10-CM

## 2012-06-28 DIAGNOSIS — E1169 Type 2 diabetes mellitus with other specified complication: Secondary | ICD-10-CM | POA: Insufficient documentation

## 2012-06-28 DIAGNOSIS — L97509 Non-pressure chronic ulcer of other part of unspecified foot with unspecified severity: Secondary | ICD-10-CM | POA: Diagnosis present

## 2012-06-28 DIAGNOSIS — Z87891 Personal history of nicotine dependence: Secondary | ICD-10-CM

## 2012-06-28 DIAGNOSIS — E1165 Type 2 diabetes mellitus with hyperglycemia: Principal | ICD-10-CM | POA: Diagnosis present

## 2012-06-28 DIAGNOSIS — E1142 Type 2 diabetes mellitus with diabetic polyneuropathy: Secondary | ICD-10-CM | POA: Diagnosis present

## 2012-06-28 DIAGNOSIS — I739 Peripheral vascular disease, unspecified: Secondary | ICD-10-CM | POA: Diagnosis present

## 2012-06-28 DIAGNOSIS — Z794 Long term (current) use of insulin: Secondary | ICD-10-CM

## 2012-06-28 DIAGNOSIS — M869 Osteomyelitis, unspecified: Secondary | ICD-10-CM

## 2012-06-28 DIAGNOSIS — Z79899 Other long term (current) drug therapy: Secondary | ICD-10-CM | POA: Insufficient documentation

## 2012-06-28 DIAGNOSIS — E1149 Type 2 diabetes mellitus with other diabetic neurological complication: Secondary | ICD-10-CM | POA: Diagnosis present

## 2012-06-28 DIAGNOSIS — M9080 Osteopathy in diseases classified elsewhere, unspecified site: Secondary | ICD-10-CM | POA: Diagnosis present

## 2012-06-28 DIAGNOSIS — E785 Hyperlipidemia, unspecified: Secondary | ICD-10-CM | POA: Diagnosis present

## 2012-06-28 DIAGNOSIS — IMO0002 Reserved for concepts with insufficient information to code with codable children: Principal | ICD-10-CM | POA: Diagnosis present

## 2012-06-28 DIAGNOSIS — I10 Essential (primary) hypertension: Secondary | ICD-10-CM | POA: Diagnosis present

## 2012-06-28 DIAGNOSIS — Z9861 Coronary angioplasty status: Secondary | ICD-10-CM

## 2012-06-28 DIAGNOSIS — S98919A Complete traumatic amputation of unspecified foot, level unspecified, initial encounter: Secondary | ICD-10-CM

## 2012-06-28 DIAGNOSIS — K219 Gastro-esophageal reflux disease without esophagitis: Secondary | ICD-10-CM | POA: Diagnosis present

## 2012-06-28 DIAGNOSIS — S98139A Complete traumatic amputation of one unspecified lesser toe, initial encounter: Secondary | ICD-10-CM | POA: Insufficient documentation

## 2012-06-29 ENCOUNTER — Encounter (HOSPITAL_COMMUNITY): Payer: Self-pay | Admitting: *Deleted

## 2012-06-29 ENCOUNTER — Inpatient Hospital Stay (HOSPITAL_COMMUNITY)
Admission: AD | Admit: 2012-06-29 | Discharge: 2012-07-03 | DRG: 638 | Disposition: A | Discharge status: Home Health | Payer: Medicaid Other | Source: Ambulatory Visit | Attending: Internal Medicine | Admitting: Internal Medicine

## 2012-06-29 ENCOUNTER — Telehealth: Payer: Self-pay | Admitting: *Deleted

## 2012-06-29 ENCOUNTER — Ambulatory Visit (INDEPENDENT_AMBULATORY_CARE_PROVIDER_SITE_OTHER): Payer: Medicaid Other | Admitting: Internal Medicine

## 2012-06-29 ENCOUNTER — Inpatient Hospital Stay (HOSPITAL_COMMUNITY): Payer: Medicaid Other

## 2012-06-29 ENCOUNTER — Encounter: Payer: Self-pay | Admitting: Internal Medicine

## 2012-06-29 VITALS — BP 160/81 | HR 101 | Temp 98.8°F | Wt 203.3 lb

## 2012-06-29 DIAGNOSIS — E1165 Type 2 diabetes mellitus with hyperglycemia: Secondary | ICD-10-CM

## 2012-06-29 DIAGNOSIS — E11621 Type 2 diabetes mellitus with foot ulcer: Secondary | ICD-10-CM

## 2012-06-29 DIAGNOSIS — L97509 Non-pressure chronic ulcer of other part of unspecified foot with unspecified severity: Secondary | ICD-10-CM

## 2012-06-29 DIAGNOSIS — E1169 Type 2 diabetes mellitus with other specified complication: Secondary | ICD-10-CM

## 2012-06-29 DIAGNOSIS — I10 Essential (primary) hypertension: Secondary | ICD-10-CM | POA: Diagnosis present

## 2012-06-29 DIAGNOSIS — E119 Type 2 diabetes mellitus without complications: Secondary | ICD-10-CM | POA: Diagnosis present

## 2012-06-29 DIAGNOSIS — I739 Peripheral vascular disease, unspecified: Secondary | ICD-10-CM

## 2012-06-29 DIAGNOSIS — L97522 Non-pressure chronic ulcer of other part of left foot with fat layer exposed: Secondary | ICD-10-CM | POA: Diagnosis present

## 2012-06-29 DIAGNOSIS — M869 Osteomyelitis, unspecified: Secondary | ICD-10-CM | POA: Diagnosis present

## 2012-06-29 DIAGNOSIS — E118 Type 2 diabetes mellitus with unspecified complications: Secondary | ICD-10-CM

## 2012-06-29 LAB — CBC
HCT: 32.5 % — ABNORMAL LOW (ref 39.0–52.0)
MCH: 29.3 pg (ref 26.0–34.0)
MCV: 88.3 fL (ref 78.0–100.0)
RDW: 12.9 % (ref 11.5–15.5)
WBC: 7.5 10*3/uL (ref 4.0–10.5)

## 2012-06-29 LAB — COMPREHENSIVE METABOLIC PANEL
ALT: 15 U/L (ref 0–53)
AST: 14 U/L (ref 0–37)
Albumin: 3.5 g/dL (ref 3.5–5.2)
Alkaline Phosphatase: 95 U/L (ref 39–117)
BUN: 14 mg/dL (ref 6–23)
Chloride: 97 mEq/L (ref 96–112)
Potassium: 3.9 mEq/L (ref 3.5–5.1)
Sodium: 136 mEq/L (ref 135–145)
Total Bilirubin: 0.2 mg/dL — ABNORMAL LOW (ref 0.3–1.2)
Total Protein: 8 g/dL (ref 6.0–8.3)

## 2012-06-29 LAB — GLUCOSE, CAPILLARY
Glucose-Capillary: 123 mg/dL — ABNORMAL HIGH (ref 70–99)
Glucose-Capillary: 210 mg/dL — ABNORMAL HIGH (ref 70–99)

## 2012-06-29 LAB — SEDIMENTATION RATE: Sed Rate: 90 mm/hr — ABNORMAL HIGH (ref 0–16)

## 2012-06-29 MED ORDER — INSULIN ASPART 100 UNIT/ML ~~LOC~~ SOLN
0.0000 [IU] | Freq: Three times a day (TID) | SUBCUTANEOUS | Status: DC
  Administered 2012-06-29: 1 [IU] via SUBCUTANEOUS
  Administered 2012-06-30: 3 [IU] via SUBCUTANEOUS
  Administered 2012-06-30: 11 [IU] via SUBCUTANEOUS

## 2012-06-29 MED ORDER — DILTIAZEM HCL 60 MG PO TABS
60.0000 mg | ORAL_TABLET | Freq: Two times a day (BID) | ORAL | Status: DC
  Administered 2012-06-29 – 2012-07-03 (×8): 60 mg via ORAL
  Filled 2012-06-29 (×10): qty 1

## 2012-06-29 MED ORDER — ONDANSETRON HCL 4 MG PO TABS: 4.0000 mg | ORAL_TABLET | Freq: Four times a day (QID) | ORAL | Status: DC | PRN

## 2012-06-29 MED ORDER — VANCOMYCIN HCL IN DEXTROSE 1-5 GM/200ML-% IV SOLN
1000.0000 mg | Freq: Three times a day (TID) | INTRAVENOUS | Status: DC
  Administered 2012-06-30: 1000 mg via INTRAVENOUS
  Filled 2012-06-29 (×4): qty 200

## 2012-06-29 MED ORDER — PIPERACILLIN-TAZOBACTAM 3.375 G IVPB
3.3750 g | Freq: Three times a day (TID) | INTRAVENOUS | Status: DC
  Administered 2012-06-29 – 2012-07-03 (×11): 3.375 g via INTRAVENOUS
  Filled 2012-06-29 (×13): qty 50

## 2012-06-29 MED ORDER — ALBUTEROL SULFATE HFA 108 (90 BASE) MCG/ACT IN AERS
1.0000 | INHALATION_SPRAY | Freq: Four times a day (QID) | RESPIRATORY_TRACT | Status: DC | PRN
  Filled 2012-06-29: qty 6.7

## 2012-06-29 MED ORDER — HYDROCODONE-ACETAMINOPHEN 5-325 MG PO TABS
1.0000 | ORAL_TABLET | ORAL | Status: DC | PRN
  Administered 2012-06-30: 1 via ORAL
  Administered 2012-06-30: 2 via ORAL
  Administered 2012-06-30: 1 via ORAL
  Administered 2012-07-01 – 2012-07-02 (×4): 2 via ORAL
  Filled 2012-06-29: qty 1
  Filled 2012-06-29: qty 2
  Filled 2012-06-29: qty 1
  Filled 2012-06-29 (×4): qty 2

## 2012-06-29 MED ORDER — HEPARIN SODIUM (PORCINE) 5000 UNIT/ML IJ SOLN
5000.0000 [IU] | Freq: Three times a day (TID) | INTRAMUSCULAR | Status: DC
  Administered 2012-06-29 – 2012-07-03 (×12): 5000 [IU] via SUBCUTANEOUS
  Filled 2012-06-29 (×15): qty 1

## 2012-06-29 MED ORDER — SIMVASTATIN 20 MG PO TABS
20.0000 mg | ORAL_TABLET | Freq: Every day | ORAL | Status: DC
  Administered 2012-06-29: 20 mg via ORAL
  Filled 2012-06-29 (×2): qty 1

## 2012-06-29 MED ORDER — PANTOPRAZOLE SODIUM 40 MG PO TBEC
40.0000 mg | DELAYED_RELEASE_TABLET | Freq: Every day | ORAL | Status: DC
  Administered 2012-06-29 – 2012-07-03 (×5): 40 mg via ORAL
  Filled 2012-06-29 (×4): qty 1

## 2012-06-29 MED ORDER — GABAPENTIN 300 MG PO CAPS
600.0000 mg | ORAL_CAPSULE | Freq: Three times a day (TID) | ORAL | Status: DC
  Administered 2012-06-29 – 2012-07-03 (×12): 600 mg via ORAL
  Filled 2012-06-29 (×14): qty 2

## 2012-06-29 MED ORDER — VANCOMYCIN HCL 10 G IV SOLR
1250.0000 mg | Freq: Once | INTRAVENOUS | Status: AC
  Administered 2012-06-29: 1250 mg via INTRAVENOUS
  Filled 2012-06-29: qty 1250

## 2012-06-29 MED ORDER — LISINOPRIL 40 MG PO TABS
40.0000 mg | ORAL_TABLET | Freq: Every day | ORAL | Status: DC
  Administered 2012-06-29 – 2012-06-30 (×2): 40 mg via ORAL
  Filled 2012-06-29 (×2): qty 1

## 2012-06-29 MED ORDER — INSULIN GLARGINE 100 UNIT/ML ~~LOC~~ SOLN
55.0000 [IU] | Freq: Every day | SUBCUTANEOUS | Status: DC
  Administered 2012-06-29: 55 [IU] via SUBCUTANEOUS
  Filled 2012-06-29 (×2): qty 0.55

## 2012-06-29 MED ORDER — ONDANSETRON HCL 4 MG/2ML IJ SOLN: 4.0000 mg | Freq: Four times a day (QID) | INTRAMUSCULAR | Status: DC | PRN

## 2012-06-29 MED ORDER — TRAMADOL HCL 50 MG PO TABS
50.0000 mg | ORAL_TABLET | Freq: Four times a day (QID) | ORAL | Status: DC | PRN
  Administered 2012-06-30: 50 mg via ORAL
  Filled 2012-06-29 (×2): qty 1

## 2012-06-29 NOTE — Progress Notes (Addendum)
ANTIBIOTIC CONSULT NOTE - INITIAL  Pharmacy Consult for Vancomycin and Zosyn Indication: L foot ulcer with osteomyelitis   No Known Allergies  Patient Measurements: Height: 6' (182.9 cm) Weight: 203 lb 4.8 oz (92.216 kg) IBW/kg (Calculated) : 77.6  Vital Signs: Temp: 98.8 F (37.1 C) (06/26 1356) Temp src: Oral (06/26 1356) BP: 160/81 mmHg (06/26 1407) Pulse Rate: 101 (06/26 1407) Intake/Output from previous day:   Intake/Output from this shift:    Labs: No results found for this basename: WBC, HGB, PLT, LABCREA, CREATININE,  in the last 72 hours Estimated Creatinine Clearance: 79.7 ml/min (by C-G formula based on Cr of 1.19). No results found for this basename: VANCOTROUGH, VANCOPEAK, VANCORANDOM, [place], GENTPEAK, [place], [place], TOBRAPEAK, TOBRARND, AMIKACINPEAK, AMIKACINTROU, AMIKACIN,  in the last 72 hours   Microbiology: Recent Results (from the past 720 hour(s))  CULTURE, BLOOD (ROUTINE X 2)     Status: None   Collection Time    06/01/12  8:00 PM      Result Value Range Status   Specimen Description BLOOD RIGHT HAND   Final   Special Requests BOTTLES DRAWN AEROBIC ONLY 10CC   Final   Culture  Setup Time 06/02/2012 02:25   Final   Culture NO GROWTH 5 DAYS   Final   Report Status 06/08/2012 FINAL   Final  CULTURE, BLOOD (ROUTINE X 2)     Status: None   Collection Time    06/01/12  8:05 PM      Result Value Range Status   Specimen Description BLOOD RIGHT HAND   Final   Special Requests BOTTLES DRAWN AEROBIC AND ANAEROBIC 10CC   Final   Culture  Setup Time 06/02/2012 02:26   Final   Culture NO GROWTH 5 DAYS   Final   Report Status 06/08/2012 FINAL   Final    Medical History: Past Medical History  Diagnosis Date  . Hyperlipidemia   . Hypertension   . Osteomyelitis 2010    left foot, s/p midfoot amputation  . 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 Neuromuscular disorder     diabetic neruopathy  . PAD  (peripheral artery disease)     ABIs 11/30/11: L ABI 0.68, R ABI 0.84  . Pneumonia 2010  . Critical lower limb ischemia, lt with ABI of 0.60 12/31/2011  . PVD (peripheral vascular disease) 12/31/2011  . S/P angioplasty with stent, 12/30/11, of Lt SFA, Post. tibialis and PTA of L. ant and post. tibial vessels 12/31/2011  . Type II diabetes mellitus ~ 2002  . GERD (gastroesophageal reflux disease)    Assessment: 55 YOM with poorly controlled DM, HTN, and PVD, hx of osteo, s/p L midfoot amputation and frequent foot ulcers, admitted with left foot osteomyelitis. Pharmacy is consulted to start vancomycin and zosyn. Pt. Is afebrile, blood culture and wound cultures are pending. Scr 1.19 one months ago, CMET pending.  Goal of Therapy:  Vancomycin trough level 15-20 mcg/ml  Plan:  - Vancomycin 1250 mg iv x 1 - f/u scr for scheduled dose - Zosyn 3.375g IV Q 8 hrs (4 hr infusion) - f/uc cultures  Maryanna Shape, PharmD, BCPS  Clinical Pharmacist  Pager: [phone]   06/29/2012,4:56 PM  Addendum: Scr = 0.95, est. crcl > 90 ml/min  Plan: Vancomycin 1g IV Q 8 hrs, after one time dose Vancomycin trough at steady state if indicated.

## 2012-06-29 NOTE — Progress Notes (Signed)
Patient ID: [name], male   DOB: [birth year], 52 y.o.   MRN: [id number]  Subjective:   Patient ID: [name] male   DOB: 1960/11/23 52 y.o.   MRN: [id number]  HPI: [name] is a 52 y.o. male with history of PVD, DM, osteomyelitis of L foot s/p transmetatarsal amputation presenting to the clinic for acute visit for L foot infection.  He was seen by wound care yesterday for care of left foot ulcer. He had extensive amount of yellow/green purulent exudate and worsening pain over the past week. Debridement of ulcer was required during the wound care appointment and he was sent for plain film x-ray of his left foot. X-ray showed osteomyelitis on the plantar lateral base of the remaining proximal left fifth metatarsal. These results were brought to the knowledge of our clinic staff he was called and instructed to come to the clinic today for evaluation and direct admission. He reports in the past 4-5 days she's had worsening pain and drainage from this ulcer. He says the ulcer has been present and growing for "months." He denies fever, chills, myalgias, dizziness, nausea, vomiting, abdominal pain.  He was most recently [name] for treatment of right foot diabetic ulcer, stage II. He was treated with IV antibiotics and discharged on Bactrim and clindamycin. In January he was admitted for a osteomyelitis of the right foot and underwent transmetatarsal amputation by Dr. Sharol Given.   Past Medical History  Diagnosis Date  . Hyperlipidemia   . Hypertension   . Osteomyelitis 2010    left foot, s/p midfoot amputation  . 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 Neuromuscular disorder     diabetic neruopathy  . PAD (peripheral artery disease)     ABIs 11/30/11: L ABI 0.68, R ABI 0.84  . Pneumonia 2010  . Critical lower limb ischemia, lt with ABI of 0.60 12/31/2011  . PVD (peripheral vascular disease) 12/31/2011  . S/P angioplasty  with stent, 12/30/11, of Lt SFA, Post. tibialis and PTA of L. ant and post. tibial vessels 12/31/2011  . Type II diabetes mellitus ~ 2002  . GERD (gastroesophageal reflux disease)    Current Outpatient Prescriptions  Medication Sig Dispense Refill  . albuterol (PROVENTIL HFA;VENTOLIN HFA) 108 (90 BASE) MCG/ACT inhaler Inhale 1-2 puffs into the lungs every 6 (six) hours as needed for wheezing.  1 Inhaler  0  . aspirin 325 MG EC tablet Take 325 mg by mouth daily.      . clindamycin (CLEOCIN) 300 MG capsule Take 1 capsule (300 mg total) by mouth every 6 (six) hours.  56 capsule  0  . diltiazem (CARDIZEM) 60 MG tablet Take 1 tablet (60 mg total) by mouth 2 (two) times daily.  60 tablet  0  . gabapentin (NEURONTIN) 300 MG capsule Take 2 capsules (600 mg total) by mouth 3 (three) times daily.  180 capsule  1  . glucose blood test strip Use as instructed  100 each  12  . insulin aspart (NOVOLOG) 100 UNIT/ML injection Inject 20 Units into the skin 3 (three) times daily before meals.      . insulin glargine (LANTUS) 100 UNIT/ML injection Inject 0.7 mLs (70 Units total) into the skin at bedtime.  10 mL  12  . lisinopril (PRINIVIL,ZESTRIL) 40 MG tablet Take 40 mg by mouth daily.      . metFORMIN (GLUCOPHAGE) 1000 MG tablet Take 1 tablet (  1,000 mg total) by mouth 2 (two) times daily with a meal.  180 tablet  1  . omeprazole (PRILOSEC) 20 MG capsule Take 20 mg by mouth daily.      . pravastatin (PRAVACHOL) 40 MG tablet Take 40 mg by mouth daily.      . Silver-Carboxymethylcellulose (AQUACEL-AG HYDROFIBER) 4"X4.7" PADS Apply 1 application topically daily.  10 each  11  . sulfamethoxazole-trimethoprim (BACTRIM DS) 800-160 MG per tablet Take 2 tablets by mouth every 12 (twelve) hours.  56 tablet  0  . traMADol (ULTRAM) 50 MG tablet Take 1 tablet (50 mg total) by mouth every 6 (six) hours as needed for pain.  60 tablet  2   No current facility-administered medications for this visit.   Family History   Problem Relation Age of Onset  . Diabetes Mother   . Hypertension Brother   . Hypertension Sister   . Anesthesia problems Neg Hx    History   Social History  . Marital Status: Single    Spouse Name: N/A    Number of Children: N/A  . Years of Education: 12th   Occupational History  .  [employer]   Social History Main Topics  . Smoking status: Former Smoker -- 0.25 packs/day for 24 years    Types: Cigarettes    Quit date: 04/15/2005  . Smokeless tobacco: Never Used  . Alcohol Use: Yes     Comment: 06/01/2012 "~ once/month I have 2-3 mixed drinks"  . Drug Use: No  . Sexually Active: Yes -- Male partner(s)    Birth Control/ Protection: Condom     Comment: one partner   Other Topics Concern  . 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 General: sitting in chair, NAD HEENT: PERRL, EOMI, no scleral icterus  Cardiac: Tachycardic to 100-110, regular rate, no rubs, murmurs or gallops Pulm: clear to auscultation bilaterally, no wheezes, rales, or rhonchi Abd: soft, nontender, nondistended, BS present Ext: L foot s/p transmetatarsal amputation with 3.5 x 4.0 cm lateral foot ulcer with granulation tissue and small amt purulent drainage. Foot swollen, warm. Difficult to appreciate DP pulse. Superficial ulcer right great toe.  Assessment & Plan:   Please see problem-based charting for assessment and plan.

## 2012-06-29 NOTE — Assessment & Plan Note (Signed)
X-ray yesterday of left foot stump shows evidence of osteomyelitis along the plantar lateral base of the remaining proximal fifth metatarsal. No signs or symptoms of syste including fever or hypotension. - Admit to hospital for IV antibiotics and orthopedics evaluation
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
## 2012-06-30 ENCOUNTER — Ambulatory Visit: Payer: Medicaid Other | Admitting: Cardiovascular Disease

## 2012-06-30 LAB — C-REACTIVE PROTEIN: CRP: 8.4 mg/dL — ABNORMAL HIGH (ref <0.60)

## 2012-06-30 LAB — BASIC METABOLIC PANEL
Calcium: 8.9 mg/dL (ref 8.4–10.5)
Chloride: 96 mEq/L (ref 96–112)
Creatinine, Ser: 1.04 mg/dL (ref 0.50–1.35)
GFR calc Af Amer: >90 mL/min (ref >90)
Sodium: 133 mEq/L — ABNORMAL LOW (ref 135–145)

## 2012-06-30 LAB — GLUCOSE, CAPILLARY
Glucose-Capillary: 164 mg/dL — ABNORMAL HIGH (ref 70–99)
Glucose-Capillary: 163 mg/dL — ABNORMAL HIGH (ref 70–99)
Glucose-Capillary: 231 mg/dL — ABNORMAL HIGH (ref 70–99)

## 2012-06-30 LAB — CBC
Platelets: 266 10*3/uL (ref 150–400)
RDW: 12.9 % (ref 11.5–15.5)
WBC: 5.8 10*3/uL (ref 4.0–10.5)

## 2012-06-30 MED ORDER — LOSARTAN POTASSIUM 50 MG PO TABS: 100.0000 mg | ORAL_TABLET | Freq: Every day | ORAL | Status: DC

## 2012-06-30 MED ORDER — SILVER SULFADIAZINE 1 % EX CREA
TOPICAL_CREAM | Freq: Every day | CUTANEOUS | Status: DC
  Administered 2012-07-01 – 2012-07-03 (×3): via TOPICAL
  Filled 2012-06-30: qty 85

## 2012-06-30 MED ORDER — ATORVASTATIN CALCIUM 10 MG PO TABS
10.0000 mg | ORAL_TABLET | Freq: Every day | ORAL | Status: DC
  Administered 2012-06-30 – 2012-07-02 (×3): 10 mg via ORAL
  Filled 2012-06-30 (×4): qty 1

## 2012-06-30 MED ORDER — LOSARTAN POTASSIUM 50 MG PO TABS
100.0000 mg | ORAL_TABLET | Freq: Every day | ORAL | Status: DC
  Administered 2012-07-01 – 2012-07-03 (×3): 100 mg via ORAL
  Filled 2012-06-30 (×3): qty 2

## 2012-06-30 MED ORDER — INSULIN ASPART 100 UNIT/ML ~~LOC~~ SOLN
0.0000 [IU] | Freq: Three times a day (TID) | SUBCUTANEOUS | Status: DC
  Administered 2012-06-30: 2 [IU] via SUBCUTANEOUS
  Administered 2012-07-01 – 2012-07-02 (×4): 9 [IU] via SUBCUTANEOUS
  Administered 2012-07-02: 7 [IU] via SUBCUTANEOUS
  Administered 2012-07-02 – 2012-07-03 (×3): 5 [IU] via SUBCUTANEOUS

## 2012-06-30 MED ORDER — INSULIN GLARGINE 100 UNIT/ML ~~LOC~~ SOLN
15.0000 [IU] | Freq: Every day | SUBCUTANEOUS | Status: DC
  Administered 2012-06-30 – 2012-07-01 (×2): 15 [IU] via SUBCUTANEOUS
  Filled 2012-06-30 (×2): qty 0.15

## 2012-06-30 MED ORDER — VANCOMYCIN HCL IN DEXTROSE 1-5 GM/200ML-% IV SOLN
1000.0000 mg | Freq: Three times a day (TID) | INTRAVENOUS | Status: DC
  Administered 2012-06-30 – 2012-07-01 (×2): 1000 mg via INTRAVENOUS
  Filled 2012-06-30 (×6): qty 200
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
## 2012-06-30 NOTE — H&P (Signed)
Internal Medicine Teaching Service Attending Note Date: 06/30/2012  Patient name: [name]  Medical record number: [id number]  Date of birth: July 13, 1960  I have read the documentation by Dr. Eula Fried and I agree with her findings and assessment. I have discussed the plan of care with my resident team.   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 He has no pain in his feet, he complains of chest congestion, and dry cough. He has not taken any medicine for his cough. He denies chest pain, palpitations, shortness of breath.   His left foot ulcer is bandaged. On removing the bandage, it is a 4cm by 4 cm circular wound with irregular margins towards the lateral side, yellowish base, with some seropurulent discharge along the margins. It is about 1 cm deep but I could not probe to bone. Left foot is swollen, pulses not appreciated. Right foot ulcer smaller, under great toe, dry. Lungs clear to auscultation.                                          Left foot osteomyelitis in the setting of PVD and diabetes - I agree with the care plan provided by Dr. Eula Fried & Dr. Doug Sou. We will continue antibiotics, obtain an ortho consult, and add a cough medicine to his regimen as well. Also, we will see what happens when we stop lisinopril and add either an ARB or if its not covered by his insurance another alternative medicine for hypertension.   [place], [place] 06/30/2012, 10:19 AM.

## 2012-06-30 NOTE — Progress Notes (Signed)
Heparin SQ due at 1400 Dr. Eula Fried notified. Instructed to hold off until seen by Ortho. And give if no surgery today.

## 2012-06-30 NOTE — Progress Notes (Signed)
Subjective: [name] was seen and examined at bedside this morning.  He has no pain complaints at this time.  He is anxious to see ortho and their recommendation for his foot.  He denies fever or chills, N/V/D, abdominal pain, chest pain, shortness of breath, or any urinary complaints.    Objective: Vital signs in last 24 hours: Filed Vitals:   06/29/12 1500 06/29/12 2059 06/30/12 0520 06/30/12 0944  BP:  161/88 124/52 144/77  Pulse:  98 96 90  Temp:  98.7 F (37.1 C) 98.6 F (37 C) 98.5 F (36.9 C)  TempSrc:  Oral Oral Oral  Resp:  18 18 18   Height: 6' (1.829 m) 6' (1.829 m)    Weight: 203 lb 4.8 oz (92.216 kg) 203 lb 4.7 oz (92.213 kg)    SpO2:  100% 98% 99%   Weight change:   Intake/Output Summary (Last 24 hours) at 06/30/12 1241 Last data filed at 06/30/12 1122  Gross per 24 hour  Intake   1020 ml  Output      0 ml  Net   1020 ml   Vitals reviewed.  General: lying in bed, NAD  HEENT: PERRL, EOMI, no scleral icterus  Cardiac:RRR, no rubs, murmurs or gallops  Pulm: cough with deep inspiration, no wheezing  Abd: soft, nontender, nondistended, BS present  Ext: Lower extremity edema up to mid calf, Large open left foot lateral suface ulcer is ~5.5x4.5x0.5cm, open, wet, draining yellow liquid, tender to palpation with underlying fluctuance and associated L foot swelling. Large open right great toe ulcer 0.5x1x0.2cm and right plantar great toe head 1x0.9x0.2cm with overlying skin, minimal serosanguinous drainage.  Transmetarsal amputation of left foot. +2 b/l radial pulses, DP pulses not appreciated.  Neuro: alert and oriented X3, cranial nerves II-XII grossly intact, strength and sensation to light touch equal in bilateral upper and lower extremities  Lab Results: Basic Metabolic Panel:  Recent Labs Lab 06/29/12 1720 06/30/12 0620  NA 136 133*  K 3.9 4.2  CL 97 96  CO2 28 28  GLUCOSE 124* 382*  BUN 14 12  CREATININE 0.95 1.04  CALCIUM 9.5 8.9  MG  --  1.7    Liver Function Tests:  Recent Labs Lab 06/29/12 1720  AST 14  ALT 15  ALKPHOS 95  BILITOT 0.2*  PROT 8.0  ALBUMIN 3.5   CBC:  Recent Labs Lab 06/29/12 1720 06/30/12 0620  WBC 7.5 5.8  HGB 10.8* 10.2*  HCT 32.5* 29.6*  MCV 88.3 88.4  PLT 271 266   CBG:  Recent Labs Lab 06/29/12 1414 06/29/12 1709 06/29/12 2103 06/30/12 0751 06/30/12 1114  GLUCAP 135* 123* 210* 307* 164*   Coagulation:  Recent Labs Lab 06/29/12 1720  LABPROT 12.1  INR 0.91   Urine Drug Screen: Drugs of Abuse     Component Value Date/Time   LABOPIA POSITIVE* 03/20/2009 0455   COCAINSCRNUR NONE DETECTED 03/20/2009 0455   LABBENZ NONE DETECTED 03/20/2009 0455   AMPHETMU NONE DETECTED 03/20/2009 0455   THCU NONE DETECTED 03/20/2009 0455   LABBARB  Value: NONE DETECTED        DRUG SCREEN FOR MEDICAL PURPOSES ONLY.  IF CONFIRMATION IS NEEDED FOR ANY PURPOSE, NOTIFY LAB WITHIN 5 DAYS.        LOWEST DETECTABLE LIMITS FOR URINE DRUG SCREEN Drug Class       Cutoff (ng/mL) Amphetamine      1000 Barbiturate      200 Benzodiazepine   [id number] Tricyclics  300 Opiates          300 Cocaine          300 THC              50 03/20/2009 0455    Micro Results: Recent Results (from the past 240 hour(s))  WOUND CULTURE     Status: None   Collection Time    06/29/12  7:00 PM      Result Value Range Status   Specimen Description WOUND LEFT FOOT   Final   Special Requests Normal   Final   Gram Stain     Final   Value: NO WBC SEEN     NO SQUAMOUS EPITHELIAL CELLS SEEN     NO ORGANISMS SEEN   Culture NO GROWTH   Final   Report Status PENDING   Incomplete  WOUND CULTURE     Status: None   Collection Time    06/29/12  7:00 PM      Result Value Range Status   Specimen Description WOUND RIGHT FOOT   Final   Special Requests NONE   Final   Gram Stain     Final   Value: NO WBC SEEN     NO SQUAMOUS EPITHELIAL CELLS SEEN     NO ORGANISMS SEEN   Culture NO GROWTH   Final   Report Status PENDING   Incomplete    Studies/Results: X-ray Chest Pa And Lateral   06/29/2012   *RADIOLOGY REPORT*  Clinical Data: Cough  CHEST - 2 VIEW  Comparison: May 15, 2012.  Findings: Cardiomediastinal silhouette appears normal.  No acute pulmonary disease is noted.  Bony thorax is intact.  IMPRESSION: No acute cardiopulmonary abnormality seen.   Original Report Authenticated By: Marijo Conception.,  M.D.   Dg Foot Complete Left  06/28/2012   *RADIOLOGY REPORT*  Clinical Data: Evaluate for osteomyelitis.  Previous amputation.  LEFT FOOT - COMPLETE 3+ VIEW  Comparison: 01/06/2012  Findings: There is been an amputation across the proximal metatarsals, new from the prior study.  There is resorption along the cortical margin of the plantar lateral margin of the base of the fifth metatarsal where there is an overlying soft tissue ulcer.  This is consistent with osteomyelitis.  There are no other areas of bone resorption to suggest additional osteomyelitis.  There is no fracture.  There is no soft tissue air.  IMPRESSION: Osteomyelitis along the plantar lateral base of the remaining proximal left fifth metatarsal.   Original Report Authenticated By: Lajean Manes, M.D.   Medications: I have reviewed the patient's current medications. Scheduled Meds: . atorvastatin  10 mg Oral q1800  . diltiazem  60 mg Oral BID  . gabapentin  600 mg Oral TID  . heparin  5,000 Units Subcutaneous Q8H  . insulin aspart  0-15 Units Subcutaneous TID WC  . insulin glargine  55 Units Subcutaneous QHS  . lisinopril  40 mg Oral Daily  . pantoprazole  40 mg Oral Daily  . piperacillin-tazobactam (ZOSYN)  IV  3.375 g Intravenous Q8H  . vancomycin  1,000 mg Intravenous Q8H   Continuous Infusions:  PRN Meds:.albuterol, HYDROcodone-acetaminophen, ondansetron (ZOFRAN) IV, ondansetron, traMADol Assessment/Plan: [name] is a 52 year old male with DM, HTN, PVD, OM L foot s/p transmetatarsal amputation 01/2012, and frequent b/l diabetic foot ulcers admitted for L  foot OM.   Left foot osteomyelitis in the setting of chronic ulcer of left foot with fat layer exposed--presents with worsening left foot  lateral surface open ulcer with yellow drainage. Was seen by wound care 06/28/12 and L foot xray revealed OM along platar lateral base of remaining proximal L 5th metatarsal. Afebrile at this time and denies chills, although given severity of wound, concern for possible . Completed course of bactrim earlier this month but did not complete course of clindamycin for R foot diabetic ulcer. Dr. Sharol Given performed is prior L transmetatarsal amputation. Home meds include but patient denies being on plavix.  He does take ASA daily.  INR on admission 0.91.   -tylenol prn fever  -consulted ortho, appreciate their input -wound culture pending -blood cultures x2 pending   -continue Vancomycin and Zosyn--day 2  -wound care consult--dry dressing for the left lateral pending ortho evaluation and treatment plan. -tramadol prn pain   Diabetes mellitus type 2, uncontrolled, with complications--Last Hb [id number] 13.8 on 06/03/12. Home regimen: novolog 20 units TID, Lantus 70 units QHS, and metformin 1000mg  BID. Given current NPO status, will decrease lantus and sliding scale -Cbg monitoring  -SSI sensitive -Lantus 15 units qhs  -hold metformin in case need for imaging   Ulcer of right great toe due to diabetes mellitus--s/p recent hospital admission from 06/01/12-06/04/12. Initially treated with Cefepime and Clindamycin IV then transitioned to PO bactrim and clindamycin for MRSA and gram negative coverage for total 14 days. He completed his course of bactrim but could not afford clindamycin on discharge. The ulcer is healing, dry, non-tender, with granulation tissue on top of ulcer with no purulent drainage. Appears more superficial, less concerning for OM at this time.  -wound care consult: Hydrogel daily for the right great toe tip -continue to monitor   HYPERTENSION--BP on admission  160/81. On home regimen: cardizem 60mg  bid, lisinopril 40mg  qd, and HCTZ 25mg  qd.  -continue home medications   Diet: NPO  DVT Ppx: Heparin  Dispo: Disposition is deferred at this time, awaiting improvement of current medical problems. Anticipated discharge in approximately 1-2 day(s).   The patient does have a current PCP (Neema Bobbie Stack, MD) and does need an [facility] hospital follow-up appointment after discharge.  The patient does not have transportation limitations that hinder transportation to clinic appointments.  Services Needed at time of discharge: Y = Yes, Blank = No PT:   OT:   RN:   Equipment:   Other:     LOS: 1 day   Jerene Pitch, MD 06/30/2012, 12:41 PM

## 2012-06-30 NOTE — Consult Note (Signed)
WOC consult Note Reason for Consult: foot ulcers, however confirmed osteomyelitis on xray which is outside of the scope of practice for the [facility] nurse, would recommend consultation to ortho for care.    [name] [place] RN,CWOCN [id number]

## 2012-06-30 NOTE — Consult Note (Signed)
WOC consult Note Reason for Consult: evaluation of foot wounds on the right foot; ortho has been consulted for the left lateral foot wound due to the presence of osteomyelitis.  Wound type: bilateral neuropathic foot ulcers Measurement:  Right great toe tip: 0.5cm x 1.0cm x 0.2cm  Right plantar great toe head: 1.0cm x 0.9cm x 0.2cm  Left lateral: 5.5cm x 4.5cm x 0.5cm  Wound bed: right great toe tip & plantar right great toe: clean, moist and pink Left lateral: 90% pink, with 10% yellow slough Drainage (amount, consistency, odor) scant serosanguinous  from the right great toe ulcers, moderate yellow-brown from the left lateral  Periwound:intact with some hyperkeratosis on the right toe and left lateral foot Dressing procedure/placement/frequency: Hydrogel daily for the right great toe tip, dry dressing for the left lateral pending ortho evaluation and treatment plan.  Re consult if needed, will not follow at this time. Thanks  Therman Hughlett [place], [place] [phone])

## 2012-06-30 NOTE — Progress Notes (Signed)
Visit to patient while in hospital. Will call after patient is discharged to assist with transition of care. 

## 2012-06-30 NOTE — Care Management Note (Signed)
Noted CM consult for Home health and equipment.  Also noted that pt lives alone, however in conversation with the pt this CM learned that pt's daughter is CNA and can assist with dressing changes. Pt also states that he has changed his dressing for a long time and can continue to do this as long as he has supplies and medication. Pt given choices of [place] agencies and selected [facility] for [facility] . Pt also spoke with daughter re assistance in the home.   Please order HHRN and complete face to face if pt is to d/c to home. Pt has DME, walker, crutches and wheelchair.  Please clarify dressing instructions.  Thank you,  Jasmine Pang RN MPH Case Manager [phone]

## 2012-07-01 LAB — GLUCOSE, CAPILLARY
Glucose-Capillary: 344 mg/dL — ABNORMAL HIGH (ref 70–99)
Glucose-Capillary: 370 mg/dL — ABNORMAL HIGH (ref 70–99)
Glucose-Capillary: 380 mg/dL — ABNORMAL HIGH (ref 70–99)
Glucose-Capillary: 383 mg/dL — ABNORMAL HIGH (ref 70–99)

## 2012-07-01 MED ORDER — DOXYCYCLINE HYCLATE 50 MG PO CAPS: 50.0000 mg | ORAL_CAPSULE | Freq: Two times a day (BID) | ORAL | Status: DC

## 2012-07-01 MED ORDER — VANCOMYCIN HCL IN DEXTROSE 1-5 GM/200ML-% IV SOLN
1000.0000 mg | Freq: Three times a day (TID) | INTRAVENOUS | Status: DC
  Administered 2012-07-01 – 2012-07-03 (×5): 1000 mg via INTRAVENOUS
  Filled 2012-07-01 (×6): qty 200

## 2012-07-01 MED ORDER — DOXYCYCLINE HYCLATE 100 MG PO TABS: 100.0000 mg | ORAL_TABLET | Freq: Two times a day (BID) | ORAL | Status: DC

## 2012-07-01 NOTE — Plan of Care (Signed)
Problem: Phase I Progression Outcomes Goal: Pain controlled with appropriate interventions Outcome: Completed/Met Date Met:  07/01/12 Pain relief achieved with Vicodin ii tabs, po as needed. Goal: OOB as tolerated unless otherwise ordered Outcome: Progressing Up in room with walker, taking care of ADLs. NWB Left foot maintained. Miley Blanchett K    Goal: Initial discharge plan identified Outcome: Completed/Met Date Met:  07/01/12 Plan is to go home with continued antibiotics and dressing changes. Family is supportive. Steve Gregg K, RN  Problem: Phase II Progression Outcomes Goal: Progress activity as tolerated unless otherwise ordered Outcome: Progressing Working with PT. Theda Sers, Brynnlie Unterreiner K

## 2012-07-01 NOTE — Evaluation (Signed)
Physical Therapy Evaluation Patient Details Name: Abigail Litfin MRN: [id number] DOB: March 25, 1960 Today's Date: 07/01/2012 Time: [id number]    PT Assessment / Plan / Recommendation History of Present Illness  52 yo male with previous amputations as complications from DM is admitted with new ulcer on L foot.  He is to be strict NWB  Clinical Impression  Pt is independent in  Bed mobility and gait with RW NWB.LLE    PT Assessment  Patent does not need any further PT services    Follow Up Recommendations  No PT follow up                  Equipment Recommendations  None recommended by PT               Precautions / Restrictions Restrictions Weight Bearing Restrictions: Yes LLE Weight Bearing: Non weight bearing Other Position/Activity Restrictions: Strict NWB   Pertinent Vitals/Pain No c/o pain.  He just woke up      Mobility  Bed Mobility Bed Mobility: Supine to Sit;Sit to Supine Supine to Sit: 7: Independent Sit to Supine: 7: Independent Details for Bed Mobility Assistance: pt able to maintain LLE NWB with bed mobility Transfers Transfers: Sit to Stand;Stand to Sit Sit to Stand: 6: Modified independent (Device/Increase time) Stand to Sit: 6: Modified independent (Device/Increase time) Details for Transfer Assistance: Pt hold onto walker to pull up to stand Ambulation/Gait Ambulation/Gait Assistance: 6: Modified independent (Device/Increase time) Ambulation Distance (Feet): 15 Feet Assistive device: Rolling walker General Gait Details: Pt maintains NWB with RW without difficulty Stairs: No Wheelchair Mobility Wheelchair Mobility: No                  Prior Functioning  Home Living Family/patient expects to be discharged to:: Private residence Living Arrangements: Alone Type of Home: House Home Access: Ramped entrance Home Layout: One level Home Equipment: Paramedic - 2 wheels Additional Comments: "I know how to walk with a  walker" Prior Function Level of Independence: Independent Communication Communication: No difficulties    Cognition  Cognition Arousal/Alertness: Awake/alert Behavior During Therapy: WFL for tasks assessed/performed Overall Cognitive Status: Within Functional Limits for tasks assessed    Extremity/Trunk Assessment Lower Extremity Assessment Lower Extremity Assessment: RLE deficits/detail;LLE deficits/detail RLE Deficits / Details: amputation of 5th ray LLE Deficits / Details: L midfoot amputation with  new ulcer   Balance Balance Balance Assessed: Yes Static Sitting Balance Static Sitting - Balance Support: No upper extremity supported Static Sitting - Level of Assistance: 7: Independent Static Standing Balance Static Standing - Balance Support: Right upper extremity supported;Left upper extremity supported;Bilateral upper extremity supported;No upper extremity supported Static Standing - Level of Assistance: 6: Modified independent (Device/Increase time)  End of Session PT - End of Session Activity Tolerance: Patient tolerated treatment well Patient left: in bed;with nursing/sitter in room Nurse Communication: Mobility status  GP     Norwood Levo 07/01/2012, 10:24 AM

## 2012-07-01 NOTE — Progress Notes (Signed)
Subjective: The patient has no new complaints and no pain. He is happy that ortho is willing to try foot salvaging therapy with antibiotics.  He has no pain complaints at this time.  He denies fever or chills, N/V/D, abdominal pain, chest pain, shortness of breath, or any urinary complaints.  He would like antibiotics sent to pharmacy prior to discharge so he can get them and know insurance will pay.  Objective: Vital signs in last 24 hours: Filed Vitals:   06/30/12 1757 06/30/12 2137 07/01/12 0417 07/01/12 1000  BP: 158/76 166/87 140/71 143/78  Pulse: 89 93 101 99  Temp: 98.3 F (36.8 C) 97.8 F (36.6 C) 98.3 F (36.8 C) 98 F (36.7 C)  TempSrc: Oral Oral Oral Oral  Resp: 20 18 18 18   Height:  6' (1.829 m)    Weight:  203 lb 4.7 oz (92.213 kg)    SpO2: 100% 100% 100% 100%   Weight change: -0.1 oz (-0.003 kg)  Intake/Output Summary (Last 24 hours) at 07/01/12 1350 Last data filed at 07/01/12 1028  Gross per 24 hour  Intake   1722 ml  Output      0 ml  Net   1722 ml   Vitals reviewed.  General: lying in bed, NAD  HEENT: PERRL, EOMI, no scleral icterus  Cardiac:RRR, no rubs, murmurs or gallops  Pulm: cough with deep inspiration, no wheezing  Abd: soft, nontender, nondistended, BS present  Ext: Lower extremity edema up to mid calf, Large open left foot lateral suface ulcer is ~5.5x4.5x0.5cm, open, wet, draining yellow liquid, tender to palpation with underlying fluctuance and associated L foot swelling. Large open right great toe ulcer 0.5x1x0.2cm and right plantar great toe head 1x0.9x0.2cm with overlying skin, minimal serosanguinous drainage.  Transmetarsal amputation of left foot. +2 b/l radial pulses, DP pulses not appreciated.  Neuro: alert and oriented X3, cranial nerves II-XII grossly intact, strength and sensation to light touch equal in bilateral upper and lower extremities  Lab Results: Basic Metabolic Panel:  Recent Labs Lab 06/29/12 1720 06/30/12 0620  NA 136  133*  K 3.9 4.2  CL 97 96  CO2 28 28  GLUCOSE 124* 382*  BUN 14 12  CREATININE 0.95 1.04  CALCIUM 9.5 8.9  MG  --  1.7   Liver Function Tests:  Recent Labs Lab 06/29/12 1720  AST 14  ALT 15  ALKPHOS 95  BILITOT 0.2*  PROT 8.0  ALBUMIN 3.5   CBC:  Recent Labs Lab 06/29/12 1720 06/30/12 0620  WBC 7.5 5.8  HGB 10.8* 10.2*  HCT 32.5* 29.6*  MCV 88.3 88.4  PLT 271 266   CBG:  Recent Labs Lab 06/30/12 0751 06/30/12 1114 06/30/12 1629 06/30/12 2141 07/01/12 0744 07/01/12 1135  GLUCAP 307* 164* 163* 231* 383* 380*   Coagulation:  Recent Labs Lab 06/29/12 1720  LABPROT 12.1  INR 0.91   Urine Drug Screen: Drugs of Abuse     Component Value Date/Time   LABOPIA POSITIVE* 03/20/2009 0455   COCAINSCRNUR NONE DETECTED 03/20/2009 0455   LABBENZ NONE DETECTED 03/20/2009 0455   AMPHETMU NONE DETECTED 03/20/2009 0455   THCU NONE DETECTED 03/20/2009 0455   LABBARB  Value: NONE DETECTED        DRUG SCREEN FOR MEDICAL PURPOSES ONLY.  IF CONFIRMATION IS NEEDED FOR ANY PURPOSE, NOTIFY LAB WITHIN 5 DAYS.        LOWEST DETECTABLE LIMITS FOR URINE DRUG SCREEN Drug Class       Cutoff (  ng/mL) Amphetamine      1000 Barbiturate      200 Benzodiazepine   [id number] Tricyclics       [id number] Opiates          300 Cocaine          300 THC              50 03/20/2009 0455    Micro Results: Recent Results (from the past 240 hour(s))  CULTURE, BLOOD (ROUTINE X 2)     Status: None   Collection Time    06/29/12  5:40 PM      Result Value Range Status   Specimen Description BLOOD RIGHT ARM   Final   Special Requests BOTTLES DRAWN AEROBIC AND ANAEROBIC 10CC   Final   Culture  Setup Time 06/30/2012 03:17   Final   Culture     Final   Value:        BLOOD CULTURE RECEIVED NO GROWTH TO DATE CULTURE WILL BE HELD FOR 5 DAYS BEFORE ISSUING A FINAL NEGATIVE REPORT   Report Status PENDING   Incomplete  CULTURE, BLOOD (ROUTINE X 2)     Status: None   Collection Time    06/29/12  5:41 PM      Result Value  Range Status   Specimen Description BLOOD LEFT ANTECUBITAL   Final   Special Requests BOTTLES DRAWN AEROBIC AND ANAEROBIC 10CC   Final   Culture  Setup Time 06/30/2012 03:16   Final   Culture     Final   Value:        BLOOD CULTURE RECEIVED NO GROWTH TO DATE CULTURE WILL BE HELD FOR 5 DAYS BEFORE ISSUING A FINAL NEGATIVE REPORT   Report Status PENDING   Incomplete  WOUND CULTURE     Status: None   Collection Time    06/29/12  7:00 PM      Result Value Range Status   Specimen Description WOUND LEFT FOOT   Final   Special Requests Normal   Final   Gram Stain     Final   Value: NO WBC SEEN     NO SQUAMOUS EPITHELIAL CELLS SEEN     NO ORGANISMS SEEN   Culture FEW PSEUDOMONAS AERUGINOSA   Final   Report Status PENDING   Incomplete  WOUND CULTURE     Status: None   Collection Time    06/29/12  7:00 PM      Result Value Range Status   Specimen Description WOUND RIGHT FOOT   Final   Special Requests NONE   Final   Gram Stain     Final   Value: NO WBC SEEN     NO SQUAMOUS EPITHELIAL CELLS SEEN     NO ORGANISMS SEEN   Culture     Final   Value: MODERATE DIPHTHEROIDS(CORYNEBACTERIUM SPECIES)     Note: Standardized susceptibility testing for this organism is not available.   Report Status PENDING   Incomplete   Studies/Results: X-ray Chest Pa And Lateral   06/29/2012   *RADIOLOGY REPORT*  Clinical Data: Cough  CHEST - 2 VIEW  Comparison: May 15, 2012.  Findings: Cardiomediastinal silhouette appears normal.  No acute pulmonary disease is noted.  Bony thorax is intact.  IMPRESSION: No acute cardiopulmonary abnormality seen.   Original Report Authenticated By: Marijo Conception.,  M.D.   Medications: I have reviewed the patient's current medications. Scheduled Meds: . atorvastatin  10 mg Oral q1800  . diltiazem  60 mg Oral BID  . gabapentin  600 mg Oral TID  . heparin  5,000 Units Subcutaneous Q8H  . insulin aspart  0-9 Units Subcutaneous TID WC  . insulin glargine  15 Units Subcutaneous  QHS  . losartan  100 mg Oral Daily  . pantoprazole  40 mg Oral Daily  . piperacillin-tazobactam (ZOSYN)  IV  3.375 g Intravenous Q8H  . silver sulfADIAZINE   Topical Daily  . vancomycin  1,000 mg Intravenous Q8H   Continuous Infusions:  PRN Meds:.albuterol, HYDROcodone-acetaminophen, ondansetron (ZOFRAN) IV, ondansetron, traMADol Assessment/Plan: Mr. 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 Was seen by wound care 06/28/12 and L foot xray revealed OM along platar lateral base of remaining proximal L 5th metatarsal. Afebrile at this time and denies chills, although given severity of wound. Completed course of bactrim earlier this month but did not complete course of clindamycin for R foot diabetic ulcer. Dr. Sharol Given performed is prior L transmetatarsal amputation.    -consulted ortho, appreciate their input -Will plan to continue IV antibiotics and transition to PO on Monday -wound culture pending -blood cultures x2 pending   -continue Vancomycin and Zosyn--day 3 -tramadol prn pain   Diabetes mellitus type 2, uncontrolled, with complications--Last Hb 123XX123 13.8 on 06/03/12. Home regimen: novolog 20 units TID, Lantus 70 units QHS, and metformin 1000mg  BID. Given current NPO status, will decrease lantus and sliding scale -Cbg monitoring  -SSI sensitive -Lantus 15 units qhs   Ulcer of right great toe due to diabetes mellitus--The ulcer is healing, dry, non-tender, with granulation tissue on top of ulcer with no purulent drainage. Appears more superficial, less concerning for OM at this time.  -wound care consult: Hydrogel daily for the right great toe tip -continue to monitor   HYPERTENSION--BP 140/71. On home  regimen: cardizem 60mg  bid, lisinopril 40mg  qd, and HCTZ 25mg  qd.  -continue home medications   Diet: carb mod  DVT Ppx: Heparin  Dispo: Disposition is deferred at this time, awaiting improvement of current medical problems. Anticipated discharge in approximately 1-2 day(s).   The patient does have a current PCP (Neema Bobbie Stack, MD) and does need an [facility] hospital follow-up appointment after discharge.  The patient does not have transportation limitations that hinder transportation to clinic appointments.  Services Needed at time of discharge: Y = Yes, Blank = No PT:   OT:   RN:   Equipment:   Other:     LOS: 2 days   Olga Millers, MD 07/01/2012, 1:50 PM

## 2012-07-02 LAB — WOUND CULTURE
Gram Stain: NONE SEEN
Gram Stain: NONE SEEN

## 2012-07-02 LAB — GLUCOSE, CAPILLARY: Glucose-Capillary: 268 mg/dL — ABNORMAL HIGH (ref 70–99)

## 2012-07-02 MED ORDER — INSULIN GLARGINE 100 UNIT/ML ~~LOC~~ SOLN
50.0000 [IU] | Freq: Every day | SUBCUTANEOUS | Status: DC
  Administered 2012-07-02: 50 [IU] via SUBCUTANEOUS
  Filled 2012-07-02 (×2): qty 0.5

## 2012-07-02 MED ORDER — INSULIN ASPART 100 UNIT/ML ~~LOC~~ SOLN
10.0000 [IU] | Freq: Once | SUBCUTANEOUS | Status: AC
  Administered 2012-07-02: 10 [IU] via SUBCUTANEOUS

## 2012-07-02 NOTE — Progress Notes (Signed)
Spoke with Dr. Bary Castilla concerning patient's CBG 370.  Patient concerned about elevated blood sugar and not taking his home dose of insulin.  Will give 10 units novolog x one dose now.  MD will address home insulin tomorrow.  Dudley Major RN

## 2012-07-02 NOTE — Progress Notes (Signed)
Subjective: The patient has no new complaints and no pain. He is happy that ortho is willing to try foot salvaging therapy with antibiotics.  He has no pain complaints at this time.  He denies fever or chills, N/V/D, abdominal pain, chest pain, shortness of breath, or any urinary complaints.  He states that he spoke with the pharmacy and his doxycycline will be paid for and he will have someone pick it up today to make sure.   Objective: Vital signs in last 24 hours: Filed Vitals:   07/01/12 1748 07/01/12 2045 07/01/12 2141 07/02/12 0604  BP: 145/75 151/82  151/84  Pulse: 95 97  87  Temp: 98 F (36.7 C) 97.9 F (36.6 C)  98.1 F (36.7 C)  TempSrc: Oral Oral  Oral  Resp: 18 20  18   Height:      Weight:   200 lb 13.4 oz (91.1 kg)   SpO2: 100% 97%  97%   Weight change: -2 lb 7.3 oz (-1.114 kg)  Intake/Output Summary (Last 24 hours) at 07/02/12 1038 Last data filed at 07/01/12 1744  Gross per 24 hour  Intake   1174 ml  Output      0 ml  Net   1174 ml   Vitals reviewed.  General: lying in bed, NAD  HEENT: PERRL, EOMI, no scleral icterus  Cardiac:RRR, no rubs, murmurs or gallops  Pulm: cough with deep inspiration, no wheezing  Abd: soft, nontender, nondistended, BS present  Ext: Lower extremity edema up to mid calf, Large open left foot lateral suface ulcer is ~5.5x4.5x0.5cm, open, wet, draining yellow liquid, tender to palpation with underlying fluctuance and associated L foot swelling. Large open right great toe ulcer 0.5x1x0.2cm and right plantar great toe head 1x0.9x0.2cm with overlying skin, minimal serosanguinous drainage.  Transmetarsal amputation of left foot. +2 b/l radial pulses, DP pulses not appreciated.  Neuro: alert and oriented X3, cranial nerves II-XII grossly intact, strength and sensation to light touch equal in bilateral upper and lower extremities  Lab Results: Basic Metabolic Panel:  Recent Labs Lab 06/29/12 1720 06/30/12 0620  NA 136 133*  K 3.9 4.2    CL 97 96  CO2 28 28  GLUCOSE 124* 382*  BUN 14 12  CREATININE 0.95 1.04  CALCIUM 9.5 8.9  MG  --  1.7   CBC:  Recent Labs Lab 06/29/12 1720 06/30/12 0620  WBC 7.5 5.8  HGB 10.8* 10.2*  HCT 32.5* 29.6*  MCV 88.3 88.4  PLT 271 266   CBG:  Recent Labs Lab 07/01/12 0744 07/01/12 1135 07/01/12 1625 07/01/12 2130 07/01/12 2343 07/02/12 0807  GLUCAP 383* 380* 397* 344* 370* 268*   Micro Results: Recent Results (from the past 240 hour(s))  CULTURE, BLOOD (ROUTINE X 2)     Status: None   Collection Time    06/29/12  5:40 PM      Result Value Range Status   Specimen Description BLOOD RIGHT ARM   Final   Special Requests BOTTLES DRAWN AEROBIC AND ANAEROBIC 10CC   Final   Culture  Setup Time 06/30/2012 03:17   Final   Culture     Final   Value:        BLOOD CULTURE RECEIVED NO GROWTH TO DATE CULTURE WILL BE HELD FOR 5 DAYS BEFORE ISSUING A FINAL NEGATIVE REPORT   Report Status PENDING   Incomplete  CULTURE, BLOOD (ROUTINE X 2)     Status: None   Collection Time    06/29/12  5:41 PM      Result Value Range Status   Specimen Description BLOOD LEFT ANTECUBITAL   Final   Special Requests BOTTLES DRAWN AEROBIC AND ANAEROBIC 10CC   Final   Culture  Setup Time 06/30/2012 03:16   Final   Culture     Final   Value:        BLOOD CULTURE RECEIVED NO GROWTH TO DATE CULTURE WILL BE HELD FOR 5 DAYS BEFORE ISSUING A FINAL NEGATIVE REPORT   Report Status PENDING   Incomplete  WOUND CULTURE     Status: None   Collection Time    06/29/12  7:00 PM      Result Value Range Status   Specimen Description WOUND LEFT FOOT   Final   Special Requests Normal   Final   Gram Stain     Final   Value: NO WBC SEEN     NO SQUAMOUS EPITHELIAL CELLS SEEN     NO ORGANISMS SEEN   Culture FEW PSEUDOMONAS AERUGINOSA   Final   Report Status 07/02/2012 FINAL   Final   Organism ID, Bacteria PSEUDOMONAS AERUGINOSA   Final  WOUND CULTURE     Status: None   Collection Time    06/29/12  7:00 PM       Result Value Range Status   Specimen Description WOUND RIGHT FOOT   Final   Special Requests NONE   Final   Gram Stain     Final   Value: NO WBC SEEN     NO SQUAMOUS EPITHELIAL CELLS SEEN     NO ORGANISMS SEEN   Culture     Final   Value: MODERATE DIPHTHEROIDS(CORYNEBACTERIUM SPECIES)     Note: Standardized susceptibility testing for this organism is not available.   Report Status PENDING   Incomplete   Medications: I have reviewed the patient's current medications. Scheduled Meds: . atorvastatin  10 mg Oral q1800  . diltiazem  60 mg Oral BID  . gabapentin  600 mg Oral TID  . heparin  5,000 Units Subcutaneous Q8H  . insulin aspart  0-9 Units Subcutaneous TID WC  . insulin glargine  50 Units Subcutaneous QHS  . losartan  100 mg Oral Daily  . pantoprazole  40 mg Oral Daily  . piperacillin-tazobactam (ZOSYN)  IV  3.375 g Intravenous Q8H  . silver sulfADIAZINE   Topical Daily  . vancomycin  1,000 mg Intravenous Q8H   Continuous Infusions:  PRN Meds:.albuterol, HYDROcodone-acetaminophen, ondansetron (ZOFRAN) IV, ondansetron, traMADol Assessment/Plan: Mr. 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  Completed course of bactrim earlier this month but did not complete course of clindamycin for R foot diabetic ulcer.     -consulted ortho, appreciate their input -Will plan to continue IV antibiotics and transition to PO on Monday -wound culture some pseudomonas will await sensitivites -blood cultures x2 no growth   -continue Vancomycin and Zosyn--day 4 -tramadol prn pain   Diabetes mellitus type 2, uncontrolled, with complications--Last Hb 123XX123 13.8 on 06/03/12. Home regimen: novolog 20 units TID,  Lantus 70 units QHS, and metformin 1000mg  BID. Since eating will increase to closer to home dose. -Cbg monitoring  -SSI sensitive -Lantus 50 units qhs  Ulcer of right great toe due to diabetes mellitus--The ulcer is healing, dry, non-tender, with granulation tissue on top of ulcer with no purulent drainage. Appears more superficial, less concerning for OM at this time.  -wound care consult: Hydrogel daily for the right great toe tip -continue to monitor   HYPERTENSION--BP 140/71. On home regimen: cardizem 60mg  bid, lisinopril 40mg  qd, and HCTZ 25mg  qd.  -continue home medications   Diet: carb mod  DVT Ppx: Heparin  Dispo: Disposition is deferred at this time, awaiting improvement of current medical problems. Anticipated discharge in approximately 1 day(s).   The patient does have a current PCP (Neema Bobbie Stack, MD) and does need an [facility] hospital follow-up appointment after discharge.  The patient does not have transportation limitations that hinder transportation to clinic appointments.  Services Needed at time of discharge: Y = Yes, Blank = No PT:   OT:   RN:   Equipment:   Other:     LOS: 3 days   Olga Millers, MD 07/02/2012, 10:38 AM

## 2012-07-02 NOTE — Care Management Note (Signed)
   CARE MANAGEMENT NOTE 07/02/2012  Patient:  [name],[name]   Account Number:  [id number]  Date Initiated:  06/30/2012  Documentation initiated by:  Olar Santini  Subjective/Objective Assessment:   Referral for [facility] services.     Action/Plan:   06/30/12 Met with pt re HH needs, pt experienced in dressing changing and daughter is CNA and can assist. Pt selected [facility] for [facility] and supplies.   Anticipated DC Date:  07/04/2012   Anticipated DC Plan:  [place]         Choice offered to / List presented to:          [facility] arranged  HH-1 RN      [place].   Status of service:  Completed, signed off Medicare Important Message given?   (If response is "NO", the following Medicare IM given date fields will be blank) Date Medicare IM given:   Date Additional Medicare IM given:    Discharge Disposition:  [place]  Per UR Regulation:    If discussed at Long Length of Stay Meetings, dates discussed:    Comments:  07/02/2012 Pt states that he has crutches, walker and wheelchair, no further DME needs. [facility] for [facility] and supplies. Pt states that he has changed his dressing in the past and will continue to work with dressing. Pt daughter is CNA and can assist if needed. Jasmine Pang RN MPH Case Manager [phone]

## 2012-07-03 DIAGNOSIS — L97409 Non-pressure chronic ulcer of unspecified heel and midfoot with unspecified severity: Secondary | ICD-10-CM

## 2012-07-03 LAB — GLUCOSE, CAPILLARY: Glucose-Capillary: 363 mg/dL — ABNORMAL HIGH (ref 70–99)

## 2012-07-03 MED ORDER — LOSARTAN POTASSIUM 100 MG PO TABS: 100.0000 mg | ORAL_TABLET | Freq: Every day | ORAL | Status: DC

## 2012-07-03 MED ORDER — DOXYCYCLINE HYCLATE 100 MG PO TABS
100.0000 mg | ORAL_TABLET | Freq: Two times a day (BID) | ORAL | Status: DC
  Administered 2012-07-03: 100 mg via ORAL
  Filled 2012-07-03 (×2): qty 1

## 2012-07-03 MED ORDER — CIPROFLOXACIN HCL 500 MG PO TABS: 750.0000 mg | ORAL_TABLET | Freq: Two times a day (BID) | ORAL | Status: DC

## 2012-07-03 MED ORDER — SILVER SULFADIAZINE 1 % EX CREA: TOPICAL_CREAM | Freq: Every day | CUTANEOUS | Status: DC

## 2012-07-03 MED ORDER — INSULIN ASPART 100 UNIT/ML ~~LOC~~ SOLN
10.0000 [IU] | Freq: Three times a day (TID) | SUBCUTANEOUS | Status: DC
  Administered 2012-07-03: 10 [IU] via SUBCUTANEOUS

## 2012-07-03 NOTE — Discharge Summary (Signed)
Name: [name] MRN: [id number] DOB: 1960-04-03 52 y.o. PCP: [name], MD  Date of Admission: 06/29/2012  3:53 PM Date of Discharge: 07/03/2012 Attending Physician: Dr. 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     aspirin 325 MG EC tablet  Take 325 mg by mouth daily.     ciprofloxacin 500 MG tablet  Commonly known as:  CIPRO  Take 1.5 tablets (750 mg total) by mouth 2 (two) times daily.     diltiazem 60 MG tablet  Commonly known as:  CARDIZEM  Take 1 tablet (60 mg total) by mouth 2 (two) times daily.     doxycycline 100 MG tablet  Commonly known as:  VIBRA-TABS  Take 1 tablet (100 mg total) by mouth 2 (two) times daily.     gabapentin 300 MG capsule  Commonly known as:  NEURONTIN  Take 2 capsules (600 mg total) by mouth 3 (three) times daily.     hydrochlorothiazide 25 MG tablet  Commonly known as:  HYDRODIURIL  Take 25 mg by mouth daily.     insulin aspart 100 UNIT/ML injection  Commonly known as:  novoLOG  Inject 27 Units into the skin 3 (three) times daily with meals.     insulin glargine 100 UNIT/ML injection  Commonly known as:  LANTUS  Inject 0.7 mLs (70 Units total) into the skin at bedtime.     losartan 100 MG tablet  Commonly known as:  COZAAR  Take 1 tablet (100 mg total) by mouth daily.     metFORMIN 1000 MG tablet  Commonly known as:  GLUCOPHAGE  Take 1 tablet  (1,000 mg total) by mouth 2 (two) times daily with a meal.     omeprazole 20 MG capsule  Commonly known as:  PRILOSEC  Take 20 mg by mouth daily.     pravastatin 40 MG tablet  Commonly known as:  PRAVACHOL  Take 40 mg by mouth at bedtime.     silver sulfADIAZINE 1 % cream  Commonly known as:  SILVADENE  Apply topically daily.     traMADol 50 MG tablet  Commonly known as:  ULTRAM  Take 1 tablet (50 mg total) by mouth every 6 (six) hours as needed for pain.       Disposition and follow-up:   [name] was discharged from [facility] in Stable condition.  At the hospital follow up visit please address:  1.   L Foot OM: monitor healing, follow up with ortho, non-weightbearing, continue doxycycline and ciprofloxacin.  Continue dressings as directed DM2--uncontrolled, resumed on home regimen, Lantus 70units and novolog 27units TID HTN: discontinued Lisinopril due to cough and transitioned to Losartan  2.  Labs / imaging needed at time of follow-up: monitor ulcers of L and R foot  3.  Pending labs/ test needing follow-up: blood  cultures x2 from 06/29/12  Follow-up Appointments:     Follow-up Information   Follow up with [name], MD On 07/21/2012. (@10am )    Contact information:   [place] [address] [phone]       Follow up with Jerene Pitch, MD On 07/06/2012. (@330pm )    Contact information:   [address] [place] [address] [phone]       Follow up with Fransisca Kaufmann, MD On 07/26/2012. (@145pm )    Contact information:   [address] [place] [address] [phone]      Discharge Instructions: Discharge Orders   Future Appointments Provider Department Dept Phone   07/05/2012 8:45 AM [place] [phone]   07/06/2012 3:30 PM Jerene Pitch, MD [place] [phone]   07/26/2012 1:45 PM [name], MD [place] [phone]   Future Orders Complete By Expires     Call MD for:  redness, tenderness, or signs of infection (pain, swelling, redness, odor or green/yellow discharge around incision site)  As directed     Call MD for:  severe uncontrolled pain  As directed     Call MD for:  temperature >100.4  As directed     Diet - low sodium heart healthy  As directed     Comments:      Carb modified    Discharge wound care:  As directed     Comments:      Per wound care, change dressing and apply silvadene to affected areas as instructed    Increase activity slowly  As directed       Consultations: Orthopaedic, Dr. Sharol Given  Procedures Performed:  X-ray Chest Pa And Lateral   06/29/2012   *RADIOLOGY REPORT*  Clinical Data: Cough  CHEST - 2 VIEW  Comparison: May 15, 2012.  Findings: Cardiomediastinal silhouette appears normal.  No acute pulmonary disease is noted.  Bony thorax is intact.  IMPRESSION: No acute cardiopulmonary abnormality seen.   Original Report Authenticated By: Marijo Conception.,  M.D.   Dg Foot Complete Left  06/28/2012   *RADIOLOGY REPORT*  Clinical Data: Evaluate for osteomyelitis.  Previous amputation.  LEFT FOOT - COMPLETE 3+ VIEW  Comparison: 01/06/2012  Findings: There is been an amputation across the proximal metatarsals, new from the prior study.  There is resorption along the cortical margin of the plantar lateral margin of the base of the fifth metatarsal where there is an overlying soft tissue ulcer.  This is consistent with osteomyelitis.  There are no other areas of bone resorption to suggest additional osteomyelitis.  There is no fracture.  There is no soft tissue air.  IMPRESSION: Osteomyelitis along the plantar lateral base of the remaining proximal left fifth metatarsal.   Original Report Authenticated By: Lajean Manes, M.D.   Admission HPI: 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 Mr. Guzman was recently seen in [facility] by Dr. Owens Shark on 06/09/12 for bilateral foot ulcers and was outfitted for non-weight bearing shoe at that visit. Since then, he finally followed up with woundcare who obtained a L foot xray on 6/25 revealing  OM along plantar lateral base of L 5th metatarsal. He was asked to follow up in clinic after results of the xray were available.  Mr. Dooms was recently hospitalized from 5/29 to 6/1 for right great toe diabetic foot ulcer treated with cefepime and clindamycin initially and discharged home on bactrim and clindamycin. Since discharge, he was unable to afford all his medications but did complete his bactrim course and was unable to afford all dressing materials, but has been applying vaseline to affected areas. However, over the course of the past month he has had a worsening left foot ulcer to the point that it is now much bigger with clear or yellow discharge. He endorses worsening pain and swelling over the past week of left foot but denies any fever, chills, N/V/D, abdominal pain, or any urinary complaints at this time. He can still walk. He says the left foot ulcer developed prior to his last hospital admission when he had to walk several miles in the rain one day. After that day, he first noticed a blister which then turned into an ulcer. The ulcer is ~5x4cm, open, wet, draining yellow liquid, tender to palpation with underlying fluctuance and associated L foot swelling. He also has his right great toe ulcer that has apparently healed much more than before per patient. He claims he has been working hard on his blood glucose control since his last HbA1C was 13.8 on 05/2012.  Finally, Mr. Angle endorses intermittent difficulty breathing when lying supine over  the past few days along with complaints of "chest congestion not pain", and intermittent dry cough. He had PNA in the past. Currently denies any nasal congestion, productive cough, sore throat, headaches, changes in vision.   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 Completed course of bactrim earlier this month but did not complete course of clindamycin for R foot diabetic ulcer that was prescribed since he was unable to afford the medication.  Evaluated by Orthopedics during admission, seen by Dr. Sharol Given who is willing to try medical management at this time.  Recommended continuing with IV antibiotics and then transition to doxycycline PO BID along with silvadene dressing changes and nonweightbearing.  Mr. Mcrea was initially started on IV Vancomycin and Zosyn and transitioned to oral doxycycline 100mg  bid and also started on ciprofloxacin 750mg  po bid on discharge. Blood cultures x2 show no growth to date.  Wound culture grew out moderate diphtheroid (corynebacterium) and few pseudomonas aeruginosa (pansensitive).  Recommended to follow up with Dr. Sharol Given as an outpatient and with pcp office.   Diabetes mellitus type 2, uncontrolled, with complications--Last Hb 123XX123 13.8 on 06/03/12. Home regimen: novolog 20 units TID, Lantus 70 units QHS, and metformin 1000mg  BID.  Resume home regimen on discharge. Will need to work on glycemic control, recommended to check blood sugars regularly and bring meter to clinic.  Follow up with pcp.   Ulcer of right great toe due to diabetes mellitus--The ulcer is  healing, dry, non-tender, with granulation tissue on top of ulcer with no purulent drainage. Appears more superficial, less concerning for OM at this  time. Wound care  Was consulted who recommended hydrogel daily for the right great toe tip  -continue to monitor   HYPERTENSION--BP 140/71. On home regimen: cardizem 60mg  bid, lisinopril 40mg  qd, and HCTZ 25mg  qd. Discontinued lisinopril during admission cut to complaints of persistent dry cough.  Transitioned to Losartan.  Continue other home medications on discharge.  Follow up with PCP.   Discharge Vitals:   BP 166/91  Pulse 88  Temp(Src) 98 F (36.7 C) (Oral)  Resp 18  Ht 6' (1.829 m)  Wt 200 lb 13.4 oz (91.1 kg)  BMI 27.23 kg/m2  SpO2 98%  Discharge Labs:  Results for orders placed during the hospital encounter of 06/29/12 (from the past 24 hour(s))  GLUCOSE, CAPILLARY     Status: Abnormal   Collection Time    07/02/12  9:17 PM      Result Value Range   Glucose-Capillary 351 (*) 70 - 99 mg/dL  GLUCOSE, CAPILLARY     Status: Abnormal   Collection Time    07/03/12  7:46 AM      Result Value Range   Glucose-Capillary 230 (*) 70 - 99 mg/dL  GLUCOSE, CAPILLARY     Status: Abnormal   Collection Time    07/03/12 11:30 AM      Result Value Range   Glucose-Capillary 300 (*) 70 - 99 mg/dL   Signed: Jerene Pitch, MD 07/03/2012, 5:23 PM   Time Spent on Discharge: 35 minutes Services Ordered on Discharge: none Equipment Ordered on Discharge: rolling walker, crutches, and wheelchair already at home

## 2012-07-03 NOTE — Progress Notes (Signed)
Discharge instructions reviewed with patient. Verbalized understanding of need to follow-up with MD, complete full course of antibiotics, keep weight off of left foot, continue daily dressing changes, and call if condition worsens. Medications were reviewed, prescriptions have been called in to pharmacy. Saline Lock was removed, catheter intact. Able to complete ADLs independently.  No further complaints were expressed. Patient felt he had improved and was near his baseline. Patient discharged to: Home Accompanied by: self Escorted to car via wheelchair. Savan Ruta, Lincoln Brigham, RN

## 2012-07-03 NOTE — Progress Notes (Signed)
Internal Medicine Teaching Service Attending Note Date: 07/03/2012  Patient name: [name]  Medical record number: [id number]  Date of birth: 03-Jul-1960   I met with Mr Tuy along with my resident team. We inspected his wound. He seems to be doing well and wants to go home. He wants to salvage his foot, and ortho has agreed to give him a trial of antibiotics. His left foot wound looks the same as before, its still draining purulent discharge. We inspected the right foot big toe wound as well, which seemed to be the same as it was at the time of admission too - non-draining. We discussed for over 15 minutes, the precautions that he needs to take when he is discharged - wound care, no weight bearing on left foot, compliance to medications and control of blood sugars. He admits understanding. Since his wound culture is growing pseudomonas, we will send him home on ciprofloxacin and doxycycline both. I discussed his care in detail with my resident team. Please see their note for details.      [name] 07/03/2012, 12:41 PM.

## 2012-07-03 NOTE — Progress Notes (Signed)
Subjective: [name] was seen and examined at bedside this morning.  He has no current complaints, denies fever, chills, N/V/D, abdominal pain, chest pain, shortness of breath, or any urinary complaints.  He plans to continue to work on his blood sugars and avoid weight bearing on foot as instructed by Dr. Sharol Given and will continue dressing changes and antibiotics.   Objective: Vital signs in last 24 hours: Filed Vitals:   07/02/12 1757 07/02/12 2057 07/03/12 0537 07/03/12 1000  BP: 146/72 166/83 159/92 166/91  Pulse: 91 98 82 88  Temp: 97.8 F (36.6 C) 98.3 F (36.8 C) 98.5 F (36.9 C) 98 F (36.7 C)  TempSrc: Oral Oral Oral Oral  Resp: 18 18 18 18   Height:      Weight:      SpO2: 96% 99% 99% 98%   Weight change:   Intake/Output Summary (Last 24 hours) at 07/03/12 1159 Last data filed at 07/03/12 0900  Gross per 24 hour  Intake   1480 ml  Output      0 ml  Net   1480 ml   Vitals reviewed.  General: lying in bed, NAD  HEENT: PERRL, EOMI, no scleral icterus  Cardiac:RRR, no rubs, murmurs or gallops  Pulm: CTA B/L, no wheezing  Abd: soft, nontender, nondistended, BS present  Ext: Left Lower extremity +1 pitting edema up to mid calf, Large open left foot lateral suface ulcer is ~5.5x4.5x0.5cm, dry and intact dressing in place on left foot. Large open right great toe ulcer 0.5x1x0.2cm and right plantar great toe head 1x0.9x0.2cm with overlying skin, minimal serosanguinous drainage, dry and intact dressing in place.  Transmetarsal amputation of left foot. +2 b/l radial pulses, pedal pulses not appreciated.  Neuro: alert and oriented X3, cranial nerves II-XII grossly intact, strength and sensation to light touch equal in bilateral upper and lower extremities  Lab Results: Basic Metabolic Panel:  Recent Labs Lab 06/29/12 1720 06/30/12 0620  NA 136 133*  K 3.9 4.2  CL 97 96  CO2 28 28  GLUCOSE 124* 382*  BUN 14 12  CREATININE 0.95 1.04  CALCIUM 9.5 8.9  MG  --  1.7    CBC:  Recent Labs Lab 06/29/12 1720 06/30/12 0620  WBC 7.5 5.8  HGB 10.8* 10.2*  HCT 32.5* 29.6*  MCV 88.3 88.4  PLT 271 266   CBG:  Recent Labs Lab 07/02/12 0807 07/02/12 1120 07/02/12 1655 07/02/12 2117 07/03/12 0746 07/03/12 1130  GLUCAP 268* 310* 363* 351* 230* 300*   Micro Results: Recent Results (from the past 240 hour(s))  CULTURE, BLOOD (ROUTINE X 2)     Status: None   Collection Time    06/29/12  5:40 PM      Result Value Range Status   Specimen Description BLOOD RIGHT ARM   Final   Special Requests BOTTLES DRAWN AEROBIC AND ANAEROBIC 10CC   Final   Culture  Setup Time 06/30/2012 03:17   Final   Culture     Final   Value:        BLOOD CULTURE RECEIVED NO GROWTH TO DATE CULTURE WILL BE HELD FOR 5 DAYS BEFORE ISSUING A FINAL NEGATIVE REPORT   Report Status PENDING   Incomplete  CULTURE, BLOOD (ROUTINE X 2)     Status: None   Collection Time    06/29/12  5:41 PM      Result Value Range Status   Specimen Description BLOOD LEFT ANTECUBITAL   Final   Special Requests  BOTTLES DRAWN AEROBIC AND ANAEROBIC 10CC   Final   Culture  Setup Time 06/30/2012 03:16   Final   Culture     Final   Value:        BLOOD CULTURE RECEIVED NO GROWTH TO DATE CULTURE WILL BE HELD FOR 5 DAYS BEFORE ISSUING A FINAL NEGATIVE REPORT   Report Status PENDING   Incomplete  WOUND CULTURE     Status: None   Collection Time    06/29/12  7:00 PM      Result Value Range Status   Specimen Description WOUND LEFT FOOT   Final   Special Requests Normal   Final   Gram Stain     Final   Value: NO WBC SEEN     NO SQUAMOUS EPITHELIAL CELLS SEEN     NO ORGANISMS SEEN   Culture FEW PSEUDOMONAS AERUGINOSA   Final   Report Status 07/02/2012 FINAL   Final   Organism ID, Bacteria PSEUDOMONAS AERUGINOSA   Final  WOUND CULTURE     Status: None   Collection Time    06/29/12  7:00 PM      Result Value Range Status   Specimen Description WOUND RIGHT FOOT   Final   Special Requests NONE   Final    Gram Stain     Final   Value: NO WBC SEEN     NO SQUAMOUS EPITHELIAL CELLS SEEN     NO ORGANISMS SEEN   Culture     Final   Value: MODERATE DIPHTHEROIDS(CORYNEBACTERIUM SPECIES)     Note: Standardized susceptibility testing for this organism is not available.   Report Status 07/02/2012 FINAL   Final   Medications: I have reviewed the patient's current medications. Scheduled Meds: . atorvastatin  10 mg Oral q1800  . diltiazem  60 mg Oral BID  . doxycycline  100 mg Oral Q12H  . gabapentin  600 mg Oral TID  . heparin  5,000 Units Subcutaneous Q8H  . insulin aspart  0-9 Units Subcutaneous TID WC  . insulin aspart  10 Units Subcutaneous TID WC  . insulin glargine  50 Units Subcutaneous QHS  . losartan  100 mg Oral Daily  . pantoprazole  40 mg Oral Daily  . 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  Completed course of bactrim earlier this month but did not complete course of clindamycin for R foot diabetic ulcer.     -f/u with Dr. Sharol Given from ortho in 2 weeks -transitioned to PO doxycyline today for at least 1 month and also added ciprofloxacin 750mg  q12h po on discharge for pseudomonas  -wound culture: pansensitive pseudomonas and diphtheroids corynebacterium -blood cultures x2 NGTD  -discontinue Vancomycin and Zosyn -tramadol prn pain   -strictly nonweightbearing on LLE until follow up with Dr. Sharol Given  Diabetes mellitus type 2, uncontrolled, with complications--Last Hb 123XX123 13.8 on 06/03/12. Home regimen: novolog 20 units TID,  Lantus 70 units QHS, and metformin 1000mg  BID.  -resume home regimen on discharge  Ulcer of right great toe due to diabetes mellitus--The ulcer is healing, dry, non-tender,  with granulation tissue on top of ulcer with no purulent drainage. Appears more superficial, less concerning for OM at this time.  -wound care consult: Hydrogel daily for the right great toe tip -continue to monitor   HYPERTENSION--BP 140/71. On home regimen: cardizem 60mg  bid, lisinopril 40mg  qd, and HCTZ 25mg  qd.  -continue home medications   Diet: carb mod  DVT Ppx: Heparin  Dispo: d/c home today  The patient does have a current PCP (Neema Bobbie Stack, MD) and does need an [facility] hospital follow-up appointment after discharge.  The patient does not have transportation limitations that hinder transportation to clinic appointments.  Services Needed at time of discharge: Y = Yes, Blank = No PT: nonweightbearing  OT:   RN:   Equipment: Rolling walker, crutches, and cane already at home  Other:     LOS: 4 days   Jerene Pitch, MD 07/03/2012, 11:59 AM

## 2012-07-04 ENCOUNTER — Telehealth: Payer: Self-pay | Admitting: Dietician

## 2012-07-04 NOTE — Telephone Encounter (Signed)
Discharge date: 06-3012 Call date: 07-04-12 Hospital follow up appointment date: 07-06-12 3:30 PM  Calling to assist with transition of care from hospital to home. Discharge medications reviewed: only lisinopril Able to fill all prescriptions? yes Patient aware of hospital follow up appointments. Reminded him No problems with transportation.denies  Other problems/concerns: patient verbalized desire for better blood sugars and self care so that he doesn't lose any more parts". He reports that he is working his way back to 27 units Novolog three times a day. Took 20 today and blood sugar was 230 afterwards. Offered assistance of CDE as needed.

## 2012-07-05 ENCOUNTER — Encounter (HOSPITAL_BASED_OUTPATIENT_CLINIC_OR_DEPARTMENT_OTHER): Payer: Medicaid Other | Attending: General Surgery

## 2012-07-05 DIAGNOSIS — E1169 Type 2 diabetes mellitus with other specified complication: Secondary | ICD-10-CM | POA: Insufficient documentation

## 2012-07-05 DIAGNOSIS — L97509 Non-pressure chronic ulcer of other part of unspecified foot with unspecified severity: Secondary | ICD-10-CM | POA: Insufficient documentation

## 2012-07-05 DIAGNOSIS — M86679 Other chronic osteomyelitis, unspecified ankle and foot: Secondary | ICD-10-CM | POA: Insufficient documentation

## 2012-07-05 NOTE — Addendum Note (Signed)
Addended by: Hulan Fray on: 07/05/2012 10:08 AM   Modules accepted: Orders

## 2012-07-05 NOTE — Discharge Summary (Signed)
Internal Medicine Teaching Service Attending Note Date: 07/05/2012  Patient name: [name]  Medical record number: [id number]  Date of birth: Mar 01, 1960    I evaluated the patient on the day of discharge and discussed the discharge plan with my resident team. I agree with the discharge documentation and disposition. For details, please see my note from the day of discharge, or the residents' dc summary.    Thanks [name] 07/05/2012, 10:45 AM

## 2012-07-05 NOTE — Progress Notes (Signed)
Case discussed with Dr. Sandy Salaam at the time of the visit.  We reviewed the resident's history and exam and pertinent patient test results.  I agree with the assessment, diagnosis, and plan of care documented in the resident's note.  [name] requires admission for appropriate therapy for osteomyelitis.

## 2012-07-06 ENCOUNTER — Encounter: Payer: Medicaid Other | Admitting: Internal Medicine

## 2012-07-06 LAB — CULTURE, BLOOD (ROUTINE X 2): Culture: NO GROWTH

## 2012-07-13 ENCOUNTER — Other Ambulatory Visit: Payer: Self-pay

## 2012-07-17 ENCOUNTER — Encounter: Payer: Self-pay | Admitting: Internal Medicine

## 2012-07-17 ENCOUNTER — Ambulatory Visit (INDEPENDENT_AMBULATORY_CARE_PROVIDER_SITE_OTHER): Payer: Medicaid Other | Admitting: Internal Medicine

## 2012-07-17 VITALS — BP 157/89 | HR 96 | Temp 97.0°F | Ht 72.0 in | Wt 196.8 lb

## 2012-07-17 DIAGNOSIS — M869 Osteomyelitis, unspecified: Secondary | ICD-10-CM

## 2012-07-17 DIAGNOSIS — E1169 Type 2 diabetes mellitus with other specified complication: Secondary | ICD-10-CM

## 2012-07-17 DIAGNOSIS — E1165 Type 2 diabetes mellitus with hyperglycemia: Secondary | ICD-10-CM

## 2012-07-17 DIAGNOSIS — E118 Type 2 diabetes mellitus with unspecified complications: Secondary | ICD-10-CM

## 2012-07-17 DIAGNOSIS — M9080 Osteopathy in diseases classified elsewhere, unspecified site: Secondary | ICD-10-CM

## 2012-07-17 LAB — GLUCOSE, CAPILLARY: Glucose-Capillary: 286 mg/dL — ABNORMAL HIGH (ref 70–99)

## 2012-07-17 MED ORDER — TRAMADOL HCL 50 MG PO TABS: 50.0000 mg | ORAL_TABLET | Freq: Four times a day (QID) | ORAL | Status: DC | PRN

## 2012-07-17 NOTE — Assessment & Plan Note (Addendum)
cbg 286 today though pt states he has not taken his meds yet today. He states his fsbs normally run 130s-140s  Patient instructed to bring meter to next visit  It should be reviewed to see if patient is being compliant with his medications  Repeat HA1C at follow up Titrate meds accordingly based on readings at follow up

## 2012-07-17 NOTE — Assessment & Plan Note (Addendum)
Continue Cipro and Doxy Will CC note to Dr. Sharol Given to see if he needs to continue Doxy since organism (i.e Psedomonas) was sensitive to Cipro and if he will manage antibiotics.   F/u 07/26/12 in clinic

## 2012-07-17 NOTE — Patient Instructions (Addendum)
Bring all medications and meter to each visit  Take medications as prescribed  Follow up in 07/26/12 08/2012 for HA1C check Pick up medication from the pharmacy   Type 2 Diabetes Mellitus, Adult Type 2 diabetes mellitus, often simply referred to as type 2 diabetes, is a long-lasting (chronic) disease. In type 2 diabetes, the pancreas does not make enough insulin (a hormone), the cells are less responsive to the insulin that is made (insulin resistance), or both. Normally, insulin moves sugars from food into the tissue cells. The tissue cells use the sugars for energy. The lack of insulin or the lack of normal response to insulin causes excess sugars to build up in the blood instead of going into the tissue cells. As a result, high blood sugar (hyperglycemia) develops. The effect of high sugar (glucose) levels can cause many complications. Type 2 diabetes was also previously called adult-onset diabetes but it can occur at any age.  RISK FACTORS  A person is predisposed to developing type 2 diabetes if someone in the family has the disease and also has one or more of the following primary risk factors:  Overweight.  An inactive lifestyle.  A history of consistently eating high-calorie foods. Maintaining a normal weight and regular physical activity can reduce the chance of developing type 2 diabetes. SYMPTOMS  A person with type 2 diabetes may not show symptoms initially. The symptoms of type 2 diabetes appear slowly. The symptoms include:  Increased thirst (polydipsia).  Increased urination (polyuria).  Increased urination during the night (nocturia).  Weight loss. This weight loss may be rapid.  Frequent, recurring infections.  Tiredness (fatigue).  Weakness.  Vision changes, such as blurred vision.  Fruity smell to your breath.  Abdominal pain.  Nausea or vomiting.  Cuts or bruises which are slow to heal.  Tingling or numbness in the hands or feet. DIAGNOSIS Type 2  diabetes is frequently not diagnosed until complications of diabetes are present. Type 2 diabetes is diagnosed when symptoms or complications are present and when blood glucose levels are increased. Your blood glucose level may be checked by one or more of the following blood tests:  A fasting blood glucose test. You will not be allowed to eat for at least 8 hours before a blood sample is taken.  A random blood glucose test. Your blood glucose is checked at any time of the day regardless of when you ate.  A hemoglobin A1c blood glucose test. A hemoglobin A1c test provides information about blood glucose control over the previous 3 months.  An oral glucose tolerance test (OGTT). Your blood glucose is measured after you have not eaten (fasted) for 2 hours and then after you drink a glucose-containing beverage. TREATMENT   You may need to take insulin or diabetes medicine daily to keep blood glucose levels in the desired range.  You will need to match insulin dosing with exercise and healthy food choices. The treatment goal is to maintain the before meal blood sugar (preprandial glucose) level at 70 130 mg/dL. HOME CARE INSTRUCTIONS   Have your hemoglobin A1c level checked twice a year.  Perform daily blood glucose monitoring as directed by your caregiver.  Monitor urine ketones when you are ill and as directed by your caregiver.  Take your diabetes medicine or insulin as directed by your caregiver to maintain your blood glucose levels in the desired range.  Never run out of diabetes medicine or insulin. It is needed every day.  Adjust insulin based on  your intake of carbohydrates. Carbohydrates can raise blood glucose levels but need to be included in your diet. Carbohydrates provide vitamins, minerals, and fiber which are an essential part of a healthy diet. Carbohydrates are found in fruits, vegetables, whole grains, dairy products, legumes, and foods containing added sugars.    Eat  healthy foods. Alternate 3 meals with 3 snacks.  Lose weight if overweight.  Carry a medical alert card or wear your medical alert jewelry.  Carry a 15 gram carbohydrate snack with you at all times to treat low blood glucose (hypoglycemia). Some examples of 15 gram carbohydrate snacks include:  Glucose tablets, 3 or 4   Glucose gel, 15 gram tube  Raisins, 2 tablespoons (24 grams)  Jelly beans, 6  Animal crackers, 8  Regular pop, 4 ounces (120 mL)  Gummy treats, 9  Recognize hypoglycemia. Hypoglycemia occurs with blood glucose levels of 70 mg/dL and below. The risk for hypoglycemia increases when fasting or skipping meals, during or after intense exercise, and during sleep. Hypoglycemia symptoms can include:  Tremors or shakes.  Decreased ability to concentrate.  Sweating.  Increased heart rate.  Headache.  Dry mouth.  Hunger.  Irritability.  Anxiety.  Restless sleep.  Altered speech or coordination.  Confusion.  Treat hypoglycemia promptly. If you are alert and able to safely swallow, follow the 15:15 rule:  Take 15 20 grams of rapid-acting glucose or carbohydrate. Rapid-acting options include glucose gel, glucose tablets, or 4 ounces (120 mL) of fruit juice, regular soda, or low fat milk.  Check your blood glucose level 15 minutes after taking the glucose.  Take 15 20 grams more of glucose if the repeat blood glucose level is still 70 mg/dL or below.  Eat a meal or snack within 1 hour once blood glucose levels return to normal.    Be alert to polyuria and polydipsia which are early signs of hyperglycemia. An early awareness of hyperglycemia allows for prompt treatment. Treat hyperglycemia as directed by your caregiver.  Engage in at least 150 minutes of moderate-intensity physical activity a week, spread over at least 3 days of the week or as directed by your caregiver. In addition, you should engage in resistance exercise at least 2 times a week or  as directed by your caregiver.  Adjust your medicine and food intake as needed if you start a new exercise or sport.  Follow your sick day plan at any time you are unable to eat or drink as usual.  Avoid tobacco use.  Limit alcohol intake to no more than 1 drink per day for nonpregnant women and 2 drinks per day for men. You should drink alcohol only when you are also eating food. Talk with your caregiver whether alcohol is safe for you. Tell your caregiver if you drink alcohol several times a week.  Follow up with your caregiver regularly.  Schedule an eye exam soon after the diagnosis of type 2 diabetes and then annually.  Perform daily skin and foot care. Examine your skin and feet daily for cuts, bruises, redness, nail problems, bleeding, blisters, or sores. A foot exam by a caregiver should be done annually.  Brush your teeth and gums at least twice a day and floss at least once a day. Follow up with your dentist regularly.  Share your diabetes management plan with your workplace or school.  Stay up-to-date with immunizations.  Learn to manage stress.  Obtain ongoing diabetes education and support as needed.  Participate in, or seek rehabilitation  as needed to maintain or improve independence and quality of life. Request a physical or occupational therapy referral if you are having foot or hand numbness or difficulties with grooming, dressing, eating, or physical activity. SEEK MEDICAL CARE IF:   You are unable to eat food or drink fluids for more than 6 hours.  You have nausea and vomiting for more than 6 hours.  Your blood glucose level is over 240 mg/dL.  There is a change in mental status.  You develop an additional serious illness.  You have diarrhea for more than 6 hours.  You have been sick or have had a fever for a couple of days and are not getting better.  You have pain during any physical activity.  SEEK IMMEDIATE MEDICAL CARE IF:  You have difficulty  breathing.  You have moderate to large ketone levels. MAKE SURE YOU:  Understand these instructions.  Will watch your condition.  Will get help right away if you are not doing well or get worse. Document Released: 12/21/2004 Document Revised: 09/15/2011 Document Reviewed: 07/20/2011 [facility] Patient Information 2014 [place].

## 2012-07-17 NOTE — Progress Notes (Signed)
  Subjective:    Patient ID: [name], male    DOB: 02-17-1960, 52 y.o.   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  He presents for follow up for left foot osteomyelitis with ~2-3 cm x 2-3 cm wound to left sole of foot 5 th metatarsal. He needs a Rx refills of Ultram.  He denies fever, chills.  He states he twisted his left ankle with a smaller Darco shoe and now he wears a larger Darco shoe and is walking better.  He has an appt to see Dr. Sharol Given 7/18.  He is still taking Cipro and Doxycycline.  He forgot his DM meter at home today.  His cbg is 286 today.       Review of Systems  Constitutional: Negative for fever and chills.       Objective:   Physical Exam  Nursing note and vitals reviewed. Constitutional: He is oriented to person, place, and time. He appears well-developed and well-nourished. He is cooperative. No distress.  HENT:  Head: Normocephalic and atraumatic.  Eyes: Conjunctivae are normal.  Neck:    Neurological: He is alert and oriented to person, place, and time.  Skin: He is not diaphoretic.     Psychiatric: He has a normal mood and affect. His speech is normal and behavior is normal. Judgment and thought content normal. Cognition and memory are normal.          Assessment & Plan:  Follow up with Dr. Sharol Given and 07/26/12 with Dr. Burnard Bunting Bring meter and medications to all visits

## 2012-07-18 ENCOUNTER — Encounter: Payer: Self-pay | Admitting: Internal Medicine

## 2012-07-19 ENCOUNTER — Telehealth: Payer: Self-pay | Admitting: Internal Medicine

## 2012-07-19 NOTE — Progress Notes (Signed)
Case discussed with Dr. McLean at the time of the visit.  We reviewed the resident's history and exam and pertinent patient test results.  I agree with the assessment, diagnosis, and plan of care documented in the resident's note.     

## 2012-07-19 NOTE — Telephone Encounter (Signed)
Medication compliance question.    [place] Losartan filled 07/03/2012, Diltiazem filled 06/05/12 (should be out but has not come back yet for another Rx), Metformin filled on 06/13/12, Lisinopril filled 04/2012 and 06/2012 (he needs to pick this medication up for refill), last time filled Lantus was 06/04/2012 (he should be out of this medication unless going to another pharmacy to get it patient has not come back). He also uses [name]. Will get someone to call 7/17 to see if patient compliant.   Aundra Dubin MD

## 2012-07-26 ENCOUNTER — Encounter: Payer: Medicaid Other | Admitting: Internal Medicine

## 2012-08-09 ENCOUNTER — Encounter (HOSPITAL_BASED_OUTPATIENT_CLINIC_OR_DEPARTMENT_OTHER): Payer: Self-pay | Attending: General Surgery

## 2012-08-09 DIAGNOSIS — E1169 Type 2 diabetes mellitus with other specified complication: Secondary | ICD-10-CM | POA: Insufficient documentation

## 2012-08-09 DIAGNOSIS — L97509 Non-pressure chronic ulcer of other part of unspecified foot with unspecified severity: Secondary | ICD-10-CM | POA: Insufficient documentation

## 2012-08-15 ENCOUNTER — Ambulatory Visit: Payer: Self-pay

## 2012-08-29 NOTE — Progress Notes (Signed)
[facility]  NAME:  [name], [name] NO.:  [id number]  MEDICAL RECORD NO.:  [id number]      DATE OF BIRTH:  [birth year]  PHYSICIAN:  Judene Companion, M.D.           VISIT DATE:                                  OFFICE VISIT   [name] is a 52 year old male, who is a type 2 diabetic.  He has been coming to this clinic for quite some time.  He has had a transmetatarsal amputation on the left, and currently has a 2 x 2 Wagner 3 diabetic foot ulcer on the side of the foot that had the surgery.  He also has a diabetic ulcer on his right great toe.  He has hypertension for which he takes lisinopril.  He has been a diabetic for about 15 years.  He has been trying to work, and I have always admired his willingness to go back to working and again keep on working, but at this point with these ulcers, he is unable to do the job satisfactorily, and we are treating him for these ulcers.  His blood pressure is 140/80, temperature 98, pulse 80, respirations 16. He weighs 198 pounds.  He is 6 feet tall.  His blood sugar on the last visit at the clinic was 150.  I am treating this patient with collagen and offloading, and I think in order to save his foot, I want to incorporate hyperbaric oxygen along with antibiotics offloading and also biologic dressings.  I am writing you at this point, to get permission to have this man under hyperbaric oxygen, as we are trying so much to save this young man's lamb.     Judene Companion, M.D.     PP/MEDQ  D:  08/28/2012  T:  08/29/2012  Job:  [id number]

## 2012-09-06 ENCOUNTER — Encounter (HOSPITAL_BASED_OUTPATIENT_CLINIC_OR_DEPARTMENT_OTHER): Payer: No Typology Code available for payment source | Attending: General Surgery

## 2012-09-06 DIAGNOSIS — E1169 Type 2 diabetes mellitus with other specified complication: Secondary | ICD-10-CM | POA: Insufficient documentation

## 2012-09-06 DIAGNOSIS — L97509 Non-pressure chronic ulcer of other part of unspecified foot with unspecified severity: Secondary | ICD-10-CM | POA: Insufficient documentation

## 2012-10-04 ENCOUNTER — Telehealth: Payer: Self-pay | Admitting: *Deleted

## 2012-10-04 ENCOUNTER — Encounter (HOSPITAL_BASED_OUTPATIENT_CLINIC_OR_DEPARTMENT_OTHER): Payer: No Typology Code available for payment source | Attending: General Surgery

## 2012-10-04 DIAGNOSIS — E1169 Type 2 diabetes mellitus with other specified complication: Secondary | ICD-10-CM | POA: Insufficient documentation

## 2012-10-04 DIAGNOSIS — L97509 Non-pressure chronic ulcer of other part of unspecified foot with unspecified severity: Secondary | ICD-10-CM | POA: Insufficient documentation

## 2012-10-04 NOTE — Telephone Encounter (Signed)
Call to [facility]. Pt last picked up Hydrocodone in 01/2012.  Has waiting prescriptions Stool Soften-01/2012, Hydrocodone 02/2012 and Aqua Gel 06/2012.  Sander Nephew, RN 10/04/2012 11:39 AM.

## 2012-11-08 ENCOUNTER — Other Ambulatory Visit: Payer: Self-pay | Admitting: *Deleted

## 2012-11-08 ENCOUNTER — Encounter (HOSPITAL_BASED_OUTPATIENT_CLINIC_OR_DEPARTMENT_OTHER): Payer: No Typology Code available for payment source | Attending: General Surgery

## 2012-11-08 DIAGNOSIS — M869 Osteomyelitis, unspecified: Secondary | ICD-10-CM

## 2012-11-08 DIAGNOSIS — E1165 Type 2 diabetes mellitus with hyperglycemia: Secondary | ICD-10-CM

## 2012-11-08 DIAGNOSIS — L97509 Non-pressure chronic ulcer of other part of unspecified foot with unspecified severity: Secondary | ICD-10-CM | POA: Insufficient documentation

## 2012-11-08 DIAGNOSIS — E1169 Type 2 diabetes mellitus with other specified complication: Secondary | ICD-10-CM | POA: Insufficient documentation

## 2012-11-08 NOTE — Telephone Encounter (Signed)
For the meds not available at [facility] please call into pt's pharmacy of choice

## 2012-11-10 MED ORDER — METFORMIN HCL 1000 MG PO TABS: 1000.0000 mg | ORAL_TABLET | Freq: Two times a day (BID) | ORAL | Status: DC

## 2012-11-10 MED ORDER — INSULIN GLARGINE 100 UNIT/ML ~~LOC~~ SOLN: 70.0000 [IU] | Freq: Every day | SUBCUTANEOUS | Status: DC

## 2012-11-10 MED ORDER — LOSARTAN POTASSIUM 100 MG PO TABS: 100.0000 mg | ORAL_TABLET | Freq: Every day | ORAL | Status: DC

## 2012-11-10 MED ORDER — INSULIN ASPART 100 UNIT/ML ~~LOC~~ SOLN: 27.0000 [IU] | Freq: Three times a day (TID) | SUBCUTANEOUS | Status: DC

## 2012-11-13 ENCOUNTER — Telehealth: Payer: Self-pay | Admitting: *Deleted

## 2012-11-13 DIAGNOSIS — I10 Essential (primary) hypertension: Secondary | ICD-10-CM

## 2012-11-13 NOTE — Telephone Encounter (Signed)
benicar [place] get not losartan, can you change them?

## 2012-11-14 MED ORDER — OLMESARTAN MEDOXOMIL 40 MG PO TABS: 40.0000 mg | ORAL_TABLET | Freq: Every day | ORAL | Status: DC

## 2012-11-16 ENCOUNTER — Telehealth: Payer: Self-pay | Admitting: *Deleted

## 2012-11-16 NOTE — Telephone Encounter (Signed)
Call from [facility] at [place] - needed clarification of directions for Novolog. -  27 units ordered per Dr Burnard Bunting 11/5.

## 2012-11-18 NOTE — Telephone Encounter (Signed)
Novolog 27u TID is correct, thanks!

## 2012-12-06 ENCOUNTER — Encounter (HOSPITAL_BASED_OUTPATIENT_CLINIC_OR_DEPARTMENT_OTHER): Payer: No Typology Code available for payment source | Attending: General Surgery

## 2012-12-06 DIAGNOSIS — M9080 Osteopathy in diseases classified elsewhere, unspecified site: Secondary | ICD-10-CM | POA: Insufficient documentation

## 2012-12-06 DIAGNOSIS — M869 Osteomyelitis, unspecified: Secondary | ICD-10-CM | POA: Insufficient documentation

## 2012-12-06 DIAGNOSIS — E1169 Type 2 diabetes mellitus with other specified complication: Secondary | ICD-10-CM | POA: Insufficient documentation

## 2012-12-06 DIAGNOSIS — L97509 Non-pressure chronic ulcer of other part of unspecified foot with unspecified severity: Secondary | ICD-10-CM | POA: Insufficient documentation

## 2013-01-10 ENCOUNTER — Encounter (HOSPITAL_BASED_OUTPATIENT_CLINIC_OR_DEPARTMENT_OTHER): Payer: Medicaid Other

## 2013-01-17 ENCOUNTER — Encounter: Payer: Self-pay | Admitting: Internal Medicine

## 2013-01-17 ENCOUNTER — Encounter: Payer: Self-pay | Admitting: Dietician

## 2013-01-17 ENCOUNTER — Encounter: Payer: Self-pay | Admitting: *Deleted

## 2013-01-31 ENCOUNTER — Ambulatory Visit: Payer: Medicaid Other

## 2013-02-15 ENCOUNTER — Telehealth: Payer: Self-pay | Admitting: *Deleted

## 2013-02-15 ENCOUNTER — Ambulatory Visit (HOSPITAL_COMMUNITY)
Admission: RE | Admit: 2013-02-15 | Discharge: 2013-02-15 | Disposition: A | Discharge status: Home / Self Care | Payer: No Typology Code available for payment source | Source: Ambulatory Visit | Attending: Internal Medicine | Admitting: Internal Medicine

## 2013-02-15 ENCOUNTER — Ambulatory Visit (INDEPENDENT_AMBULATORY_CARE_PROVIDER_SITE_OTHER): Payer: No Typology Code available for payment source | Admitting: Internal Medicine

## 2013-02-15 ENCOUNTER — Encounter: Payer: Self-pay | Admitting: Internal Medicine

## 2013-02-15 VITALS — BP 182/96 | HR 98 | Temp 98.0°F | Wt 190.9 lb

## 2013-02-15 DIAGNOSIS — E1169 Type 2 diabetes mellitus with other specified complication: Secondary | ICD-10-CM

## 2013-02-15 DIAGNOSIS — E118 Type 2 diabetes mellitus with unspecified complications: Principal | ICD-10-CM

## 2013-02-15 DIAGNOSIS — I10 Essential (primary) hypertension: Secondary | ICD-10-CM

## 2013-02-15 DIAGNOSIS — Z0000 Encounter for general adult medical examination without abnormal findings: Secondary | ICD-10-CM

## 2013-02-15 DIAGNOSIS — K219 Gastro-esophageal reflux disease without esophagitis: Secondary | ICD-10-CM

## 2013-02-15 DIAGNOSIS — L97519 Non-pressure chronic ulcer of other part of right foot with unspecified severity: Secondary | ICD-10-CM

## 2013-02-15 DIAGNOSIS — L97509 Non-pressure chronic ulcer of other part of unspecified foot with unspecified severity: Secondary | ICD-10-CM

## 2013-02-15 DIAGNOSIS — M869 Osteomyelitis, unspecified: Secondary | ICD-10-CM

## 2013-02-15 DIAGNOSIS — E11621 Type 2 diabetes mellitus with foot ulcer: Secondary | ICD-10-CM

## 2013-02-15 DIAGNOSIS — E1165 Type 2 diabetes mellitus with hyperglycemia: Secondary | ICD-10-CM

## 2013-02-15 DIAGNOSIS — I739 Peripheral vascular disease, unspecified: Secondary | ICD-10-CM

## 2013-02-15 DIAGNOSIS — Z23 Encounter for immunization: Secondary | ICD-10-CM

## 2013-02-15 DIAGNOSIS — E785 Hyperlipidemia, unspecified: Secondary | ICD-10-CM

## 2013-02-15 DIAGNOSIS — IMO0002 Reserved for concepts with insufficient information to code with codable children: Secondary | ICD-10-CM

## 2013-02-15 LAB — POCT GLYCOSYLATED HEMOGLOBIN (HGB A1C)

## 2013-02-15 LAB — CBC WITH DIFFERENTIAL/PLATELET
BASOS PCT: 0 % (ref 0–1)
Basophils Absolute: 0 10*3/uL (ref 0.0–0.1)
EOS ABS: 0.1 10*3/uL (ref 0.0–0.7)
EOS PCT: 1 % (ref 0–5)
HEMATOCRIT: 36.2 % — AB (ref 39.0–52.0)
Hemoglobin: 12.2 g/dL — ABNORMAL LOW (ref 13.0–17.0)
Lymphocytes Relative: 18 % (ref 12–46)
Lymphs Abs: 1.9 10*3/uL (ref 0.7–4.0)
MCH: 30 pg (ref 26.0–34.0)
MCHC: 33.7 g/dL (ref 30.0–36.0)
MCV: 89.2 fL (ref 78.0–100.0)
MONOS PCT: 6 % (ref 3–12)
Monocytes Absolute: 0.6 10*3/uL (ref 0.1–1.0)
NEUTROS ABS: 7.7 10*3/uL (ref 1.7–7.7)
Neutrophils Relative %: 75 % (ref 43–77)
Platelets: 344 10*3/uL (ref 150–400)
RBC: 4.06 MIL/uL — ABNORMAL LOW (ref 4.22–5.81)
RDW: 12.8 % (ref 11.5–15.5)
WBC: 10.3 10*3/uL (ref 4.0–10.5)

## 2013-02-15 LAB — GLUCOSE, CAPILLARY: GLUCOSE-CAPILLARY: 346 mg/dL — AB (ref 70–99)

## 2013-02-15 MED ORDER — CIPROFLOXACIN HCL 750 MG PO TABS: 750.0000 mg | ORAL_TABLET | Freq: Two times a day (BID) | ORAL | Status: DC

## 2013-02-15 MED ORDER — OMEPRAZOLE 20 MG PO CPDR: 20.0000 mg | DELAYED_RELEASE_CAPSULE | Freq: Every day | ORAL | Status: DC

## 2013-02-15 MED ORDER — DOXYCYCLINE HYCLATE 100 MG PO TABS: 100.0000 mg | ORAL_TABLET | Freq: Two times a day (BID) | ORAL | Status: DC

## 2013-02-15 MED ORDER — PRAVASTATIN SODIUM 40 MG PO TABS: 40.0000 mg | ORAL_TABLET | Freq: Every day | ORAL | Status: DC

## 2013-02-15 MED ORDER — OLMESARTAN MEDOXOMIL 20 MG PO TABS: 20.0000 mg | ORAL_TABLET | Freq: Every day | ORAL | Status: DC

## 2013-02-15 MED ORDER — DILTIAZEM HCL 60 MG PO TABS: 60.0000 mg | ORAL_TABLET | Freq: Two times a day (BID) | ORAL | Status: DC

## 2013-02-15 MED ORDER — HYDROCHLOROTHIAZIDE 25 MG PO TABS: 25.0000 mg | ORAL_TABLET | Freq: Every day | ORAL | Status: DC

## 2013-02-15 MED ORDER — TRAMADOL HCL 50 MG PO TABS: 50.0000 mg | ORAL_TABLET | Freq: Four times a day (QID) | ORAL | Status: DC | PRN

## 2013-02-15 MED ORDER — METFORMIN HCL 1000 MG PO TABS: 1000.0000 mg | ORAL_TABLET | Freq: Two times a day (BID) | ORAL | Status: DC

## 2013-02-15 MED ORDER — INSULIN GLARGINE 100 UNIT/ML ~~LOC~~ SOLN: 60.0000 [IU] | Freq: Every day | SUBCUTANEOUS | Status: DC

## 2013-02-15 MED ORDER — ASPIRIN 325 MG PO TBEC: 325.0000 mg | DELAYED_RELEASE_TABLET | Freq: Every day | ORAL | Status: DC

## 2013-02-15 MED ORDER — GABAPENTIN 300 MG PO CAPS: 600.0000 mg | ORAL_CAPSULE | Freq: Three times a day (TID) | ORAL | Status: DC

## 2013-02-15 NOTE — Patient Instructions (Addendum)
1. Please restart taking all your medications except for ciprofloxacin and doxycycline as prescribed.   2. I give you the prescription of ciprofloxacin and doxycycline now, but do not start taking these medications until I call you tomorrow. I will try to help you make an earlier appointment with [facility], if they can see you tomorrow or day after tomorrow, you may not need to take antibiotics.  2. Please come back in 10 days.    3. If you have worsening of your symptoms or new symptoms arise, such as fever, please call the clinic [phone]), or go to the ER immediately if symptoms are severe.   Please bring in all your medication bottles with you in next visit.

## 2013-02-15 NOTE — Assessment & Plan Note (Signed)
DM-II is very poorly controlled secondary to medication noncompliance. A1c >14 today. Recently he got orange card. Will restart his insuline. He was supposed to take 70 units of Lantus daily and 27 units of NovoLog 3 times a day with meals.  -will start lantus at little lower dose: 60 U daily -will continue his home dose of Novology

## 2013-02-15 NOTE — Assessment & Plan Note (Signed)
-  will give flu shot today.

## 2013-02-15 NOTE — Telephone Encounter (Signed)
Pt presents to front desk c/o of continuing pain/ infection to foot, diabetic also. To dr Blaine Hamper schedule at [phone]

## 2013-02-15 NOTE — Assessment & Plan Note (Signed)
The ulcer is shallow. X-ray did not show signs of osteomyelitis. Though patient does not have leukocytosis and fever, he has very poorly controlled DM-II and chills, we can not be absolutely sure that he does not have infection.  -will give him wound care referral for detriment -I gave him the prescription of ciprofloxacin and doxycycline for 10 days, but told him not to start taking these medications until I call you tomorrow. I will make an earlier appointment with [facility], if they can see him in one or two days,  he may not need to take antibiotics. -If wound care can not see him in two day, will start antibiotics. -will follow up in 10 days.

## 2013-02-15 NOTE — Telephone Encounter (Signed)
Agree with appt. 

## 2013-02-15 NOTE — Assessment & Plan Note (Addendum)
bp is poorly controlled secondary to medication noncompliance. He was supposed to take Cardizem 60 mg twice a day, hydrochlorothiazide 25 mg daily, Benicar 40 mg daily, but he has not taken these meds for long time. bp is 182/96 today.  -will restart Benicar at little lower home dose to avoid acute renal injury: 20 mg daily -will restart home dose of Cardizem and hydrochlorothiazide.

## 2013-02-15 NOTE — Progress Notes (Addendum)
Patient ID: [name], male   DOB: 1960/02/28, 53 y.o.   MRN: [id number] Subjective:   Patient ID: [name] male   DOB: [birth year] 53 y.o.   MRN: [id number]  CC:  Acute visit due to foot ulcer. HPI:  [name] is a 53 y.o. man with past medical history as outlined below, who present for an acute visit today.  1. Right foot ulcer: patient reports that he accidentally injured his right great toe in 12/2102, then he developed an ulcer over the plantar aspection of his great toe. It has been progressively worsening and enlarged. It is very painful. He did not seek for treatment. In the past 2 weeks, it becomes much worse and enlarged in size. It smells bad. He dose not have fever, but has chills. Of note, he has significant peripheral vascular disease secondary to diabetes. He has history of amputation in left 5 toes and right small toe. He is s/p of Lt SFA.   2. HTN:  bp is 182/96 mmHg today. He was supposed to take Cardizem 60 mg twice a day, hydrochlorothiazide 25 mg daily, Benicar 40 mg daily, but he did not take these medications for "long time" due to financial difficulties.. He does not have chest pain, shortness of breath, leg edema.  3. DM-II: A1C 13.8 on 06/03/12-->>14 today. He was supposed to take Lantus 70 units daily and 27 units of NovoLog with meals 3 times a day. Due to lack of insurance, he could not afford for Lantus. He is currently only taking NovoLog 27 units twice a day.   ROS:  Has chills and pain in foot. Denies chills, headaches, cough, chest pain, SOB,  abdominal pain, diarrhea, constipation, dysuria, urgency, frequency, hematuria or leg swelling.    Past Medical History  Diagnosis Date  . Hyperlipidemia   . Hypertension   . Osteomyelitis 2010    left foot, s/p midfoot amputation  . 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 Neuromuscular disorder     diabetic neruopathy  . PAD (peripheral artery disease)     ABIs 11/30/11: L  ABI 0.68, R ABI 0.84  . Pneumonia 2010  . Critical lower limb ischemia, lt with ABI of 0.60 12/31/2011  . PVD (peripheral vascular disease) 12/31/2011  . S/P angioplasty with stent, 12/30/11, of Lt SFA, Post. tibialis and PTA of L. ant and post. tibial vessels 12/31/2011  . Type II diabetes mellitus ~ 2002  . GERD (gastroesophageal reflux disease)    Current Outpatient Prescriptions  Medication Sig Dispense Refill  . albuterol (PROVENTIL HFA;VENTOLIN HFA) 108 (90 BASE) MCG/ACT inhaler Inhale 1-2 puffs into the lungs every 6 (six) hours as needed for wheezing.  1 Inhaler  0  . aspirin 325 MG EC tablet Take 1 tablet (325 mg total) by mouth daily.  30 tablet  5  . ciprofloxacin (CIPRO) 750 MG tablet Take 1 tablet (750 mg total) by mouth 2 (two) times daily.  20 tablet  0  . diltiazem (CARDIZEM) 60 MG tablet Take 1 tablet (60 mg total) by mouth 2 (two) times daily.  60 tablet  5  . doxycycline (VIBRA-TABS) 100 MG tablet Take 1 tablet (100 mg total) by mouth 2 (two) times daily.  20 tablet  0  . gabapentin (NEURONTIN) 300 MG capsule Take 2 capsules (600 mg total) by mouth 3 (three) times daily.  180 capsule  5  . hydrochlorothiazide (HYDRODIURIL) 25 MG tablet Take 1  tablet (25 mg total) by mouth daily.  30 tablet  5  . insulin aspart (NOVOLOG) 100 UNIT/ML injection Inject 27 Units into the skin 3 (three) times daily with meals.  1 vial  11  . insulin glargine (LANTUS) 100 UNIT/ML injection Inject 0.6 mLs (60 Units total) into the skin at bedtime.  10 mL  11  . metFORMIN (GLUCOPHAGE) 1000 MG tablet Take 1 tablet (1,000 mg total) by mouth 2 (two) times daily with a meal.  180 tablet  5  . olmesartan (BENICAR) 20 MG tablet Take 1 tablet (20 mg total) by mouth daily.  30 tablet  11  . omeprazole (PRILOSEC) 20 MG capsule Take 1 capsule (20 mg total) by mouth daily.  30 capsule  5  . pravastatin (PRAVACHOL) 40 MG tablet Take 1 tablet (40 mg total) by mouth at bedtime.  30 tablet  5  . silver sulfADIAZINE  (SILVADENE) 1 % cream Apply topically daily.  50 g  0  . traMADol (ULTRAM) 50 MG tablet Take 1 tablet (50 mg total) by mouth every 6 (six) hours as needed.  60 tablet  2   No current facility-administered medications for this visit.   Family History  Problem Relation Age of Onset  . Diabetes Mother   . Hypertension Brother   . Hypertension Sister   . Anesthesia problems Neg Hx    History   Social History  . Marital Status: Single    Spouse Name: N/A    Number of Children: N/A  . Years of Education: 12th   Occupational History  .  [employer]   Social History Main Topics  . Smoking status: Former Smoker -- 0.25 packs/day for 24 years    Types: Cigarettes    Quit date: 04/15/2005  . Smokeless tobacco: Never Used  . Alcohol Use: Yes     Comment: 06/01/2012 "~ once/month I have 2-3 mixed drinks"  . Drug Use: No  . Sexual Activity: Yes    Partners: Female    Birth Control/ Protection: Condom     Comment: one partner   Other Topics Concern  . 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 See HPI.   Objective:  Physical Exam: Filed Vitals:   02/15/13 1446  BP: 182/96  Pulse: 98  Temp: 98 F (36.7 C)  TempSrc: Oral  Weight: 190 lb 14.4 oz (86.592 kg)  SpO2: 100%   Constitutional: Vital signs reviewed.  Patient is a well-developed and well-nourished, in no acute distress and cooperative with exam.   HEENT:  Head: Normocephalic and atraumatic Mouth: no erythema or exudates, MMM Eyes: PERRL, EOMI, conjunctivae normal, No scleral icterus.  Neck: Supple, Trachea midline normal ROM, No JVD  Cardiovascular: RRR, S1 normal, S2 normal, no MRG, pulses symmetric and intact  bilaterally Pulmonary/Chest: CTAB, no wheezes, rales, or rhonchi Abdominal: Soft. Non-tender, non-distended, bowel sounds are normal, no masses, organomegaly, or guarding present.  GU: no CVA tenderness Musculoskeletal: No joint deformities, erythema, or stiffness, ROM full and non-tender Extremities: No leg edema. There is a shallow ulcer over the plantar aspect of right great toe with macerated margin, no obvious discharge, malodorous, ulcer is approximately 2  X 2 cm in size, and macerated margin area is approximately 4-6 cm in size. Hematology: no cervical, inginal, or axillary adenopathy.  Neurological: A&O x3, Strength is normal and symmetric bilaterally, cranial nerve II-XII are grossly intact, no focal motor deficit, sensory intact to light touch bilaterally. Skin: Warm, dry and intact. No rash, cyanosis, or clubbing.  Psychiatric: Normal mood and affect. No suicidal or homicidal ideation.  Assessment & Plan:   Addendum 02/16/13:  I called wound care center in the early morning, and was told that their schedule is completely full. But they agreed to put the patient on the list for the first possible cancellation slot. By the end of day, there is still no appointment available for patient. Next the possible appointment, at the best, would be in next Monday. I will go ahead to let patient start taking the antibiotics. I called pt and he agreed to do so.  Ivor Costa, MD PGY3, Internal Medicine Teaching Service Pager: [phone]

## 2013-02-16 LAB — SEDIMENTATION RATE: Sed Rate: 97 mm/hr — ABNORMAL HIGH (ref 0–16)

## 2013-02-16 NOTE — Progress Notes (Signed)
I saw and evaluated the patient.  I personally confirmed the key portions of the history and exam documented by Dr. Blaine Hamper and I reviewed pertinent patient test results.  The assessment, diagnosis, and plan were formulated together and I agree with the documentation in the resident's note. I agreed with Dr Blaine Hamper that what the pt needed was wound care and not necessarily ABX. Pt was quite concerned that we were not tx infxn which he thought he had and therefore, the best tx for the pt was to add ABX. He needs aggressive debridement as there is a sig rim around ulcer of hypertrophic non vascularized scar tissue that even extended to pad of 1 st metatarsale. Asked pt not to take ABX until wound care visit to get deep tissue cx.

## 2013-02-28 ENCOUNTER — Encounter (HOSPITAL_BASED_OUTPATIENT_CLINIC_OR_DEPARTMENT_OTHER): Payer: No Typology Code available for payment source | Attending: General Surgery

## 2013-02-28 DIAGNOSIS — E1169 Type 2 diabetes mellitus with other specified complication: Secondary | ICD-10-CM | POA: Insufficient documentation

## 2013-02-28 DIAGNOSIS — L97509 Non-pressure chronic ulcer of other part of unspecified foot with unspecified severity: Secondary | ICD-10-CM | POA: Insufficient documentation

## 2013-03-07 ENCOUNTER — Encounter (HOSPITAL_BASED_OUTPATIENT_CLINIC_OR_DEPARTMENT_OTHER): Payer: Medicaid Other | Attending: General Surgery

## 2013-03-07 DIAGNOSIS — L97509 Non-pressure chronic ulcer of other part of unspecified foot with unspecified severity: Secondary | ICD-10-CM | POA: Insufficient documentation

## 2013-03-07 DIAGNOSIS — E1169 Type 2 diabetes mellitus with other specified complication: Secondary | ICD-10-CM | POA: Insufficient documentation

## 2013-03-14 ENCOUNTER — Other Ambulatory Visit: Payer: Self-pay | Admitting: Internal Medicine

## 2013-03-14 MED ORDER — ROSUVASTATIN CALCIUM 5 MG PO TABS: 5.0000 mg | ORAL_TABLET | Freq: Every day | ORAL | Status: DC

## 2013-03-14 NOTE — Progress Notes (Signed)
Encounter opened to update medlist based on MAP request  Pravastatin --> crestor D/c omeprazole (pt asymptomatic, never dispensed)

## 2013-03-27 ENCOUNTER — Encounter (HOSPITAL_COMMUNITY): Payer: Self-pay | Admitting: Emergency Medicine

## 2013-03-27 ENCOUNTER — Inpatient Hospital Stay (HOSPITAL_COMMUNITY)
Admission: EM | Admit: 2013-03-27 | Discharge: 2013-03-30 | DRG: 293 | Disposition: A | Discharge status: Home / Self Care | Payer: Medicaid Other | Attending: Internal Medicine | Admitting: Internal Medicine

## 2013-03-27 ENCOUNTER — Emergency Department (HOSPITAL_COMMUNITY): Payer: Medicaid Other

## 2013-03-27 DIAGNOSIS — Z598 Other problems related to housing and economic circumstances: Secondary | ICD-10-CM

## 2013-03-27 DIAGNOSIS — Z79899 Other long term (current) drug therapy: Secondary | ICD-10-CM

## 2013-03-27 DIAGNOSIS — Z91199 Patient's noncompliance with other medical treatment and regimen due to unspecified reason: Secondary | ICD-10-CM

## 2013-03-27 DIAGNOSIS — E1142 Type 2 diabetes mellitus with diabetic polyneuropathy: Secondary | ICD-10-CM | POA: Diagnosis present

## 2013-03-27 DIAGNOSIS — I110 Hypertensive heart disease with heart failure: Secondary | ICD-10-CM | POA: Diagnosis present

## 2013-03-27 DIAGNOSIS — I739 Peripheral vascular disease, unspecified: Secondary | ICD-10-CM | POA: Diagnosis present

## 2013-03-27 DIAGNOSIS — R0902 Hypoxemia: Secondary | ICD-10-CM | POA: Diagnosis present

## 2013-03-27 DIAGNOSIS — L98499 Non-pressure chronic ulcer of skin of other sites with unspecified severity: Secondary | ICD-10-CM | POA: Diagnosis present

## 2013-03-27 DIAGNOSIS — E11621 Type 2 diabetes mellitus with foot ulcer: Secondary | ICD-10-CM | POA: Diagnosis present

## 2013-03-27 DIAGNOSIS — E1159 Type 2 diabetes mellitus with other circulatory complications: Secondary | ICD-10-CM | POA: Diagnosis present

## 2013-03-27 DIAGNOSIS — Z9119 Patient's noncompliance with other medical treatment and regimen: Secondary | ICD-10-CM

## 2013-03-27 DIAGNOSIS — L97519 Non-pressure chronic ulcer of other part of right foot with unspecified severity: Secondary | ICD-10-CM

## 2013-03-27 DIAGNOSIS — E1165 Type 2 diabetes mellitus with hyperglycemia: Secondary | ICD-10-CM

## 2013-03-27 DIAGNOSIS — I5033 Acute on chronic diastolic (congestive) heart failure: Principal | ICD-10-CM | POA: Diagnosis present

## 2013-03-27 DIAGNOSIS — Z5987 Material hardship due to limited financial resources, not elsewhere classified: Secondary | ICD-10-CM

## 2013-03-27 DIAGNOSIS — E1169 Type 2 diabetes mellitus with other specified complication: Secondary | ICD-10-CM

## 2013-03-27 DIAGNOSIS — E785 Hyperlipidemia, unspecified: Secondary | ICD-10-CM | POA: Diagnosis present

## 2013-03-27 DIAGNOSIS — L97509 Non-pressure chronic ulcer of other part of unspecified foot with unspecified severity: Secondary | ICD-10-CM

## 2013-03-27 DIAGNOSIS — Z95820 Peripheral vascular angioplasty status with implants and grafts: Secondary | ICD-10-CM

## 2013-03-27 DIAGNOSIS — Z5989 Other problems related to housing and economic circumstances: Secondary | ICD-10-CM

## 2013-03-27 DIAGNOSIS — E119 Type 2 diabetes mellitus without complications: Secondary | ICD-10-CM | POA: Diagnosis present

## 2013-03-27 DIAGNOSIS — Z8249 Family history of ischemic heart disease and other diseases of the circulatory system: Secondary | ICD-10-CM

## 2013-03-27 DIAGNOSIS — Z87891 Personal history of nicotine dependence: Secondary | ICD-10-CM

## 2013-03-27 DIAGNOSIS — I5040 Unspecified combined systolic (congestive) and diastolic (congestive) heart failure: Secondary | ICD-10-CM | POA: Diagnosis present

## 2013-03-27 DIAGNOSIS — I5189 Other ill-defined heart diseases: Secondary | ICD-10-CM | POA: Diagnosis present

## 2013-03-27 DIAGNOSIS — R000 Tachycardia, unspecified: Secondary | ICD-10-CM | POA: Diagnosis present

## 2013-03-27 DIAGNOSIS — I10 Essential (primary) hypertension: Secondary | ICD-10-CM | POA: Diagnosis present

## 2013-03-27 DIAGNOSIS — E1149 Type 2 diabetes mellitus with other diabetic neurological complication: Secondary | ICD-10-CM | POA: Diagnosis present

## 2013-03-27 DIAGNOSIS — E118 Type 2 diabetes mellitus with unspecified complications: Secondary | ICD-10-CM

## 2013-03-27 DIAGNOSIS — Z7982 Long term (current) use of aspirin: Secondary | ICD-10-CM

## 2013-03-27 DIAGNOSIS — I509 Heart failure, unspecified: Secondary | ICD-10-CM

## 2013-03-27 DIAGNOSIS — IMO0002 Reserved for concepts with insufficient information to code with codable children: Secondary | ICD-10-CM

## 2013-03-27 DIAGNOSIS — K219 Gastro-esophageal reflux disease without esophagitis: Secondary | ICD-10-CM | POA: Diagnosis present

## 2013-03-27 DIAGNOSIS — Z833 Family history of diabetes mellitus: Secondary | ICD-10-CM

## 2013-03-27 DIAGNOSIS — Z794 Long term (current) use of insulin: Secondary | ICD-10-CM

## 2013-03-27 HISTORY — DX: Heart failure, unspecified: I50.9

## 2013-03-27 LAB — BASIC METABOLIC PANEL WITH GFR
BUN: 10 mg/dL (ref 6–23)
CO2: 25 meq/L (ref 19–32)
Calcium: 8.7 mg/dL (ref 8.4–10.5)
Chloride: 101 meq/L (ref 96–112)
Creatinine, Ser: 0.9 mg/dL (ref 0.50–1.35)
GFR calc Af Amer: >90 mL/min (ref >90)
GFR calc non Af Amer: >90 mL/min (ref >90)
Glucose, Bld: 275 mg/dL — ABNORMAL HIGH (ref 70–99)
Potassium: 4 meq/L (ref 3.7–5.3)
Sodium: 139 meq/L (ref 137–147)

## 2013-03-27 LAB — HEPATIC FUNCTION PANEL
ALK PHOS: 108 U/L (ref 39–117)
ALT: 24 U/L (ref 0–53)
AST: 19 U/L (ref 0–37)
Albumin: 2.6 g/dL — ABNORMAL LOW (ref 3.5–5.2)
Bilirubin, Direct: <0.2 mg/dL (ref 0.0–0.3)
TOTAL PROTEIN: 6.6 g/dL (ref 6.0–8.3)
Total Bilirubin: 0.3 mg/dL (ref 0.3–1.2)

## 2013-03-27 LAB — I-STAT TROPONIN, ED: Troponin i, poc: 0.01 ng/mL (ref 0.00–0.08)

## 2013-03-27 LAB — GLUCOSE, CAPILLARY
Glucose-Capillary: 189 mg/dL — ABNORMAL HIGH (ref 70–99)
Glucose-Capillary: 182 mg/dL — ABNORMAL HIGH (ref 70–99)
Glucose-Capillary: 214 mg/dL — ABNORMAL HIGH (ref 70–99)

## 2013-03-27 LAB — TROPONIN I
Troponin I: <0.3 ng/mL (ref <0.30)
Troponin I: <0.3 ng/mL (ref <0.30)

## 2013-03-27 LAB — CBC
HCT: 32.5 % — ABNORMAL LOW (ref 39.0–52.0)
Hemoglobin: 10.8 g/dL — ABNORMAL LOW (ref 13.0–17.0)
MCH: 29.6 pg (ref 26.0–34.0)
MCHC: 33.2 g/dL (ref 30.0–36.0)
MCV: 89 fL (ref 78.0–100.0)
Platelets: 385 K/uL (ref 150–400)
RBC: 3.65 MIL/uL — ABNORMAL LOW (ref 4.22–5.81)
RDW: 13.5 % (ref 11.5–15.5)
WBC: 7.5 K/uL (ref 4.0–10.5)

## 2013-03-27 LAB — PRO B NATRIURETIC PEPTIDE: PRO B NATRI PEPTIDE: 1700 pg/mL — AB (ref 0–125)

## 2013-03-27 LAB — TSH: TSH: 1.513 u[IU]/mL (ref 0.350–4.500)

## 2013-03-27 LAB — MAGNESIUM: Magnesium: 1.7 mg/dL (ref 1.5–2.5)

## 2013-03-27 MED ORDER — ENOXAPARIN SODIUM 40 MG/0.4ML ~~LOC~~ SOLN
40.0000 mg | SUBCUTANEOUS | Status: DC
  Administered 2013-03-27 – 2013-03-30 (×4): 40 mg via SUBCUTANEOUS
  Filled 2013-03-27 (×4): qty 0.4

## 2013-03-27 MED ORDER — HYDRALAZINE HCL 20 MG/ML IJ SOLN
5.0000 mg | INTRAMUSCULAR | Status: DC | PRN
  Administered 2013-03-29: 5 mg via INTRAVENOUS
  Filled 2013-03-27: qty 1

## 2013-03-27 MED ORDER — INSULIN GLARGINE 100 UNIT/ML ~~LOC~~ SOLN
60.0000 [IU] | Freq: Every day | SUBCUTANEOUS | Status: DC
  Administered 2013-03-27 – 2013-03-28 (×2): 60 [IU] via SUBCUTANEOUS
  Filled 2013-03-27 (×2): qty 0.6

## 2013-03-27 MED ORDER — ALBUTEROL SULFATE (2.5 MG/3ML) 0.083% IN NEBU: 3.0000 mL | INHALATION_SOLUTION | Freq: Four times a day (QID) | RESPIRATORY_TRACT | Status: DC | PRN

## 2013-03-27 MED ORDER — INSULIN ASPART 100 UNIT/ML ~~LOC~~ SOLN
0.0000 [IU] | Freq: Three times a day (TID) | SUBCUTANEOUS | Status: DC
  Administered 2013-03-27 – 2013-03-28 (×3): 3 [IU] via SUBCUTANEOUS
  Administered 2013-03-28: 5 [IU] via SUBCUTANEOUS
  Administered 2013-03-29: 8 [IU] via SUBCUTANEOUS
  Administered 2013-03-29: 5 [IU] via SUBCUTANEOUS
  Administered 2013-03-29: 3 [IU] via SUBCUTANEOUS
  Administered 2013-03-30: 2 [IU] via SUBCUTANEOUS
  Administered 2013-03-30: 3 [IU] via SUBCUTANEOUS

## 2013-03-27 MED ORDER — SODIUM CHLORIDE 0.9 % IJ SOLN
3.0000 mL | Freq: Two times a day (BID) | INTRAMUSCULAR | Status: DC
  Administered 2013-03-27 – 2013-03-30 (×7): 3 mL via INTRAVENOUS

## 2013-03-27 MED ORDER — SODIUM CHLORIDE 0.9 % IJ SOLN: 3.0000 mL | INTRAMUSCULAR | Status: DC | PRN

## 2013-03-27 MED ORDER — GABAPENTIN 600 MG PO TABS
600.0000 mg | ORAL_TABLET | Freq: Three times a day (TID) | ORAL | Status: DC
  Administered 2013-03-27 – 2013-03-30 (×10): 600 mg via ORAL
  Filled 2013-03-27 (×13): qty 1

## 2013-03-27 MED ORDER — INSULIN ASPART 100 UNIT/ML ~~LOC~~ SOLN
0.0000 [IU] | Freq: Every day | SUBCUTANEOUS | Status: DC
  Administered 2013-03-27: 2 [IU] via SUBCUTANEOUS
  Administered 2013-03-28: 4 [IU] via SUBCUTANEOUS

## 2013-03-27 MED ORDER — FUROSEMIDE 10 MG/ML IJ SOLN
40.0000 mg | Freq: Once | INTRAMUSCULAR | Status: AC
  Administered 2013-03-27: 40 mg via INTRAVENOUS
  Filled 2013-03-27: qty 4

## 2013-03-27 MED ORDER — TRAMADOL HCL 50 MG PO TABS
50.0000 mg | ORAL_TABLET | Freq: Four times a day (QID) | ORAL | Status: DC | PRN
  Administered 2013-03-27 – 2013-03-29 (×5): 50 mg via ORAL
  Filled 2013-03-27 (×6): qty 1

## 2013-03-27 MED ORDER — ATORVASTATIN CALCIUM 10 MG PO TABS
10.0000 mg | ORAL_TABLET | Freq: Every day | ORAL | Status: DC
  Administered 2013-03-27 – 2013-03-29 (×3): 10 mg via ORAL
  Filled 2013-03-27 (×4): qty 1

## 2013-03-27 MED ORDER — FUROSEMIDE 10 MG/ML IJ SOLN
40.0000 mg | Freq: Two times a day (BID) | INTRAMUSCULAR | Status: DC
Start: 2013-03-27 — End: 2013-03-28
  Administered 2013-03-27 (×2): 40 mg via INTRAVENOUS
  Filled 2013-03-27 (×3): qty 4

## 2013-03-27 MED ORDER — INSULIN ASPART 100 UNIT/ML ~~LOC~~ SOLN
12.0000 [IU] | Freq: Three times a day (TID) | SUBCUTANEOUS | Status: DC
  Administered 2013-03-27 – 2013-03-29 (×7): 12 [IU] via SUBCUTANEOUS

## 2013-03-27 MED ORDER — SODIUM CHLORIDE 0.9 % IJ SOLN
3.0000 mL | Freq: Two times a day (BID) | INTRAMUSCULAR | Status: DC
  Administered 2013-03-27 – 2013-03-30 (×6): 3 mL via INTRAVENOUS

## 2013-03-27 MED ORDER — SODIUM CHLORIDE 0.9 % IV SOLN: 250.0000 mL | INTRAVENOUS | Status: DC | PRN

## 2013-03-27 MED ORDER — ASPIRIN EC 325 MG PO TBEC
325.0000 mg | DELAYED_RELEASE_TABLET | Freq: Every day | ORAL | Status: DC
  Administered 2013-03-27 – 2013-03-30 (×4): 325 mg via ORAL
  Filled 2013-03-27 (×4): qty 1

## 2013-03-27 MED ORDER — IRBESARTAN 150 MG PO TABS
150.0000 mg | ORAL_TABLET | Freq: Every day | ORAL | Status: DC
  Administered 2013-03-27 – 2013-03-28 (×2): 150 mg via ORAL
  Filled 2013-03-27 (×2): qty 1

## 2013-03-27 NOTE — ED Notes (Addendum)
Pt. reports progressing SOB with chest tightness/heaviness worse when lying flat on bed for several days .

## 2013-03-27 NOTE — Progress Notes (Signed)
Patient admitted to 3E28 from ED via stretcher. Cardiac monitor applied and CCMD notified. Patient denies pain. Patient oriented to room, call bell within reach. RN will continue to monitor. Shellee Milo, RN

## 2013-03-27 NOTE — Progress Notes (Signed)
Utilization Review Completed Glendal Cassaday J. Iliza Blankenbeckler, RN, BSN, NCM [phone]  

## 2013-03-27 NOTE — Consult Note (Signed)
WOC contacted to evaluate right foot wound. Pt well known to this WOC from previous admissions for wounds related to his diabetes.  He repots he "stubbed his toe" while wearing slippers. He is followed by the wound care center and it was noted in H& P he was s/p graft placement, therefore I verified wound care POC with wound care center.  He is actually pending placement of an Apligraf and is current using a collegen dressing.  I have ordered a silver hydrofiber to keep the wound dry and for the antimicrobial affects to continue to prepare the wound for the graft placement.  Bedside nurse to place dressing with silver arrives to the floor.  Pt agreeable with this POC. Notified wound care center of is inpatient status.   Discussed POC with patient and bedside nurse.  Re consult if needed, will not follow at this time. Thanks  Kahliya Fraleigh Kellogg, [place] [phone])
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
## 2013-03-27 NOTE — Plan of Care (Signed)
Problem: Consults Goal: Heart Failure Patient Education (See Patient Education module for education specifics.)  Outcome: Progressing New diagnose with CHF, HF video presented to the pt, pt oriented of intake and output measure, fluid restriction and daily weight. Pt oriented about carb modified diet for his blood sugar control, pt no follows diet restriction. Pt may benefit from some nutrition consult.

## 2013-03-27 NOTE — Care Management Note (Addendum)
    Page 1 of 1   03/29/2013     10:33:16 AM   CARE MANAGEMENT NOTE 03/29/2013  Patient:  [name],[name]   Account Number:  [id number]  Date Initiated:  03/27/2013  Documentation initiated by:  [facility]  Subjective/Objective Assessment:   53 yo man, history of dCHF (EF 45-50%, g2 dd, 03/2009), HTN, HL, PVD, DM2, presenting with orthopnea and PND.//Home alone     Action/Plan:   -start lasix 40 IV BID,-repeat Echo//Access for Home Health needs   Anticipated DC Date:  03/30/2013   Anticipated DC Plan:  [place]  CM consult      Choice offered to / List presented to:  C-1 Patient        [place] arranged  [place] - 11 Patient Refused      Status of service:  In process, will continue to follow Medicare Important Message given?   (If response is "NO", the following Medicare IM given date fields will be blank) Date Medicare IM given:   Date Additional Medicare IM given:    Discharge Disposition:    Per UR Regulation:    If discussed at Long Length of Stay Meetings, dates discussed:    Comments:  03/29/13 [place], RN, BSN, [employer] [phone] I have recommended that this patient have [place] services but he declines at this time. I have discussed the risks and benefits of Home Health with him. The patient verbalizes understanding.  03/27/13 [place], RN, BSN, [employer] [phone] Note that the patient notes not taking his HCTZ for the last 2 months, due to financial constraints; pt has orange card which allows him to free medications.  Will follow up with pt.

## 2013-03-28 DIAGNOSIS — I059 Rheumatic mitral valve disease, unspecified: Secondary | ICD-10-CM

## 2013-03-28 LAB — BASIC METABOLIC PANEL
BUN: 13 mg/dL (ref 6–23)
BUN: 15 mg/dL (ref 6–23)
CALCIUM: 9.2 mg/dL (ref 8.4–10.5)
CHLORIDE: 92 meq/L — AB (ref 96–112)
CO2: 27 mEq/L (ref 19–32)
CO2: 30 mEq/L (ref 19–32)
CREATININE: 0.96 mg/dL (ref 0.50–1.35)
Calcium: 8.4 mg/dL (ref 8.4–10.5)
Chloride: 97 mEq/L (ref 96–112)
Creatinine, Ser: 1.11 mg/dL (ref 0.50–1.35)
GFR calc Af Amer: 86 mL/min — ABNORMAL LOW (ref >90)
GFR calc non Af Amer: >90 mL/min (ref >90)
GFR calc non Af Amer: 74 mL/min — ABNORMAL LOW (ref >90)
GLUCOSE: 300 mg/dL — AB (ref 70–99)
GLUCOSE: 370 mg/dL — AB (ref 70–99)
Potassium: 4 mEq/L (ref 3.7–5.3)
Potassium: 4.1 mEq/L (ref 3.7–5.3)
SODIUM: 133 meq/L — AB (ref 137–147)
Sodium: 137 mEq/L (ref 137–147)

## 2013-03-28 LAB — GLUCOSE, CAPILLARY
Glucose-Capillary: 237 mg/dL — ABNORMAL HIGH (ref 70–99)
Glucose-Capillary: 94 mg/dL (ref 70–99)
Glucose-Capillary: 169 mg/dL — ABNORMAL HIGH (ref 70–99)
Glucose-Capillary: 332 mg/dL — ABNORMAL HIGH (ref 70–99)

## 2013-03-28 MED ORDER — IRBESARTAN 150 MG PO TABS
150.0000 mg | ORAL_TABLET | Freq: Once | ORAL | Status: AC
  Administered 2013-03-28: 150 mg via ORAL
  Filled 2013-03-28: qty 1

## 2013-03-28 MED ORDER — IRBESARTAN 300 MG PO TABS
300.0000 mg | ORAL_TABLET | Freq: Every day | ORAL | Status: DC
  Administered 2013-03-29 – 2013-03-30 (×2): 300 mg via ORAL
  Filled 2013-03-28 (×2): qty 1

## 2013-03-28 MED ORDER — FUROSEMIDE 10 MG/ML IJ SOLN
80.0000 mg | Freq: Two times a day (BID) | INTRAMUSCULAR | Status: DC
  Administered 2013-03-28 – 2013-03-29 (×2): 80 mg via INTRAVENOUS
  Filled 2013-03-28 (×3): qty 8

## 2013-03-28 MED ORDER — BENZONATATE 100 MG PO CAPS
100.0000 mg | ORAL_CAPSULE | Freq: Three times a day (TID) | ORAL | Status: DC | PRN
  Administered 2013-03-29 (×2): 100 mg via ORAL
  Filled 2013-03-28 (×5): qty 1

## 2013-03-28 NOTE — Progress Notes (Signed)
Subjective: [name] is doing better this morning, eating well, states his breathing and leg swelling are improved.   Objective: Vital signs in last 24 hours: Filed Vitals:   03/27/13 1139 03/27/13 1422 03/27/13 2131 03/28/13 0551  BP: 158/83 176/83 164/79 152/85  Pulse: 107 114 90 106  Temp:  98.5 F (36.9 C) 99 F (37.2 C) 98.4 F (36.9 C)  TempSrc:  Oral Oral Oral  Resp:  18 18 18   Height:      Weight:    189 lb 9.6 oz (86.002 kg)  SpO2:  92% 94% 92%   Weight change:   Intake/Output Summary (Last 24 hours) at 03/28/13 1015 Last data filed at 03/28/13 0839  Gross per 24 hour  Intake    956 ml  Output    852 ml  Net    104 ml   PEX General: alert, cooperative, and in no apparent distress HEENT: NCAT, vision grossly intact, oropharynx clear and non-erythematous  Neck: supple, no lymphadenopathy Lungs: mild crackles in bilateral lower lung fields, normal work of respiration, no wheezes, ronchi Heart: tachycardic, regular rate and rhythm, no murmurs, gallops, or rubs Abdomen: soft, non-tender, non-distended, normal bowel sounds Extremities: 2+ DP/PT pulses bilaterally, no cyanosis, clubbing, or edema Neurologic: alert & oriented X3, cranial nerves II-XII intact, strength grossly intact, sensation intact to light touch  Lab Results: Basic Metabolic Panel:  Recent Labs Lab 03/27/13 0425 03/27/13 1030 03/28/13 0532  NA 139  --  137  K 4.0  --  4.0  CL 101  --  97  CO2 25  --  27  GLUCOSE 275*  --  300*  BUN 10  --  13  CREATININE 0.90  --  0.96  CALCIUM 8.7  --  8.4  MG  --  1.7  --    Liver Function Tests:  Recent Labs Lab 03/27/13 1030  AST 19  ALT 24  ALKPHOS 108  BILITOT 0.3  PROT 6.6  ALBUMIN 2.6*   CBC:  Recent Labs Lab 03/27/13 0425  WBC 7.5  HGB 10.8*  HCT 32.5*  MCV 89.0  PLT 385   Cardiac Enzymes:  Recent Labs Lab 03/27/13 1030 03/27/13 1927  TROPONINI <0.30 <0.30   BNP:  Recent Labs Lab 03/27/13 0425  PROBNP  1700.0*   CBG:  Recent Labs Lab 03/27/13 1147 03/27/13 1607 03/27/13 2146 03/28/13 0541  GLUCAP 189* 182* 214* 237*   Thyroid Function Tests:  Recent Labs Lab 03/27/13 1030  TSH 1.513    Studies/Results: Dg Chest 2 View  03/27/2013   CLINICAL DATA:  Shortness of breath.  EXAM: CHEST  2 VIEW  COMPARISON:  Chest x-ray 06/29/2012.  FINDINGS: There is cephalization of the pulmonary vasculature, indistinctness of the interstitial markings, and patchy airspace disease throughout the lungs bilaterally suggestive of moderate pulmonary edema. Small bilateral pleural effusions. Mild cardiomegaly. Atherosclerosis in the thoracic aorta.  IMPRESSION: 1. The appearance of the chest suggests congestive heart failure, as above.   Electronically Signed   By: Vinnie Langton M.D.   On: 03/27/2013 05:23   Medications: I have reviewed the patient's current medications. Scheduled Meds: . aspirin  325 mg Oral Daily  . atorvastatin  10 mg Oral q1800  . enoxaparin (LOVENOX) injection  40 mg Subcutaneous Q24H  . furosemide  80 mg Intravenous BID  . gabapentin  600 mg Oral TID  . insulin aspart  0-15 Units Subcutaneous TID WC  . insulin aspart  0-5 Units Subcutaneous  QHS  . insulin aspart  12 Units Subcutaneous TID WC  . insulin glargine  60 Units Subcutaneous QHS  . irbesartan  150 mg Oral Daily  . sodium chloride  3 mL Intravenous Q12H  . sodium chloride  3 mL Intravenous Q12H   Continuous Infusions:  PRN Meds:.sodium chloride, albuterol, hydrALAZINE, sodium chloride, traMADol Assessment/Plan: # Acute CHF exacerbation, improved - Patient presented with orthopnea, PND, and BLE edema with pro-BNP = 1700, consistent with acute CHF exacerbation.  Last echo in 03/2009 showed grade 2 diastolic dysfunction with EF 45-50%.  Etiology of this CHF exacerbation likely secondary to HCTZ non-compliance vs gradual worsening of CHF. Symptoms not likely due to PNA (no leukocytosis, CXR showed only pulm edema) vs PE  (Wells score = 1.5 (low probability), symptoms better explained by CHF). Patient is s/p Lasix 40 mg IV x 3 on 3/24; net out 1L thus will increase Lasix dosing today.  Last weight 190 pounds ~1 month ago --> 189 today.  Troponin x 2 negative, TSH 1.513 (within normal limits) -increase to Lasix IV 80 BID -repeat echo today  -daily weights, strict I/Os  -continue irbesartan, aspirin, lipitor  -will initiate beta blocker at discharge  # Uncontrolled HTN, improved - Patient presented with BP in [id number], now [id number] systolic. Volume overload likely contributing significantly to the patient's elevated BP.  -continue diuresis with Lasix per above  -continue irbesartan  -hydralazine prn for SBP > 160 (patient has not required any doses thus far)  # Uncontrolled DM2 - last A1C > 14 (02/15/13).  Patient has had trouble affording insulin in the outpatient setting but notes recent compliance with Lantus 60 daily, and Novolog 27 units BID.  Glucose 300 on AM BMP, CBG 94 at noon. -consult to diabetes educator for recommendations on insulin regimen  -continue Lantus 60 qhs  -continue Novolog 12 units TID with meals plus SSI   # Diabetic foot ulcer - Patient has a right 1st toe diabetic foot ulcer, followed by the wound care center, s/p skin graft.  Dressing is changed weekly at wound care. -wound care consult, appreciate recs; applied silver dressing to right first toe    Dispo: Disposition is deferred at this time, awaiting improvement of current medical problems.  Anticipated discharge in approximately 1-2 day(s).   The patient does have a current PCP (Neema Bobbie Stack, MD) and does need an [facility] hospital follow-up appointment after discharge.   .Services Needed at time of discharge: Y = Yes, Blank = No PT:   OT:   RN:   Equipment:   Other:     LOS: 1 day   Ivin Poot, MD 03/28/2013, 10:15 AM

## 2013-03-28 NOTE — Progress Notes (Signed)
Echo Lab  2D Echocardiogram completed.  [place], RDCS 03/28/2013 3:09 PM

## 2013-03-28 NOTE — Progress Notes (Signed)
Inpatient Diabetes Program Recommendations  AACE/ADA: New Consensus Statement on Inpatient Glycemic Control (2013)  Target Ranges:  Prepandial:   less than 140 mg/dL      Peak postprandial:   less than 180 mg/dL (1-2 hours)      Critically ill patients:  140 - 180 mg/dL   Reason for Visit: Referral received for insulin regimen recommendations at discharge Diabetes history: type 2 (?) Outpatient Diabetes medications: Lantus 60 units, Novolog meal coveraage of 27 units tidwc Current orders for Inpatient glycemic control: Moderate correction tidwc and HS correction scale, Lantus 60 units and novolog meal coverage of 12 units tidwc. Noted pt has had financial concerns with obtaining meds but has been on Novolog and Lantus (?)  Will check with patient. Pt may qualify for a voucher for lantus and apidra (rather than novolog) and other concerns/needs prior to discharge. Thank you, Rosita Kea, RN, CNS, Diabetes Coordinator [phone])

## 2013-03-28 NOTE — H&P (Signed)
Internal Medicine Attending Admission Note Date: 03/28/2013  Patient name: [name] Medical record number: [id number] Date of birth: 09-Aug-1960 Age: 53 y.o. Gender: male  I saw and evaluated the patient. I reviewed the resident's note and I agree with the resident's findings and plan as documented in the resident's note.  [name] is a 53 year old man with a history of diastolic heart failure, hypertension, hyperlipidemia, diabetes, and peripheral vascular occlusive disease who presents with a four-day history of increasing shortness of breath, orthopnea, and paroxysmal nocturnal dyspnea. His symptoms are worse with lying down and improved with sitting up. He notes that because of financial reasons he is not taking any antihypertensives for the last 2-3 months. He denies any fevers, shakes, chills, nausea, or vomiting. He does have lower extremity edema. Because of the progressive nature of his dyspnea he presented to the emergency department and was admitted to the internal medicine teaching service for further evaluation and care.  Upon evaluation Mr. Gochanour was felt to have an acute decompensation of his diastolic heart failure secondary to uncontrolled hypertension. The increased afterload was related to the inability to afford his antihypertensive medications. He has been diuresed with IV Lasix resulting in moderate urine output with a net loss of about 1 L over the first 24 hours. His afterload has been addressed with an ARB as well as the loop diuretic diuresis. We will increase the ARB dose in order to improve his afterload control and increase the Lasix dose to accelerate diuresis. We spent considerable time educating him on the importance of blood pressure control with regards to management of his heart failure. Although he is feeling improved today he is not near his baseline. We will continue with the above plan until he has marked improvement in his symptoms to baseline and is noted to have a  slight increase in his BUN. At that point we will consider him to be near his dry weight and ready for discharge. I anticipate this will be in the next 2-3 days.

## 2013-03-28 NOTE — Progress Notes (Signed)
Inpatient Diabetes Program Recommendations  AACE/ADA: New Consensus Statement on Inpatient Glycemic Control (2013)  Target Ranges:  Prepandial:   less than 140 mg/dL      Peak postprandial:   less than 180 mg/dL (1-2 hours)      Critically ill patients:  140 - 180 mg/dL  During Results for [name], [name] (MRN [id number]) as of 03/28/2013 15:42  Ref. Range 03/27/2013 11:47 03/27/2013 12:46 03/27/2013 16:07 03/27/2013 19:27 03/27/2013 21:46 03/28/2013 05:32 03/28/2013 05:41 03/28/2013 08:35 03/28/2013 11:40 03/28/2013 14:46  Glucose-Capillary Latest Range: 70-99 mg/dL 189 (H)  182 (H)  214 (H) Lantus started-60 units High fasting am---------- 237 (H)  94     Spoke with patient regarding his ability to afford the lantus and novolog insulins for discharge. Pt states he has 'it all worked out now' to get his lantus and novolog. Pt states that the insulin regimen he had been on prior to being unable to afford was: Lantus 72 units daily/HS and Novolog 27 units tidwc. When asked if he had frequent or a few hypoglycemic events, he stated he hadn't.We did not get into checking glucose/cbg's as he stated he could not afford supplies either.  During this hospitalization, I noted that the lantus was begun,last night and the fasting glucose this am was high at 237 mg/dL. He received HS correction last night as well of 2 units novolog for cbg of 214 mg/dL. Yesterday using correction and 12 units meal coveraget, the cbg's were controlled in the 180's until HS.Unsure as to why the glucose rose last night (unless he ate something to elevate-He was on a HH diet which actually has more carbohydrate content than a regular diet.Marland Kitchenthus I added Carbohydrate modified with co-sign required.)  With his weight at 86 kg, pt takes more lantus than is typically recommended.  Due to high fasting this am, I cannot evaluate the effectiveness of the lantus at 60 units. I would recommend increasing his Lantus to home dose of 70 units in attempt to  lower the fasting glucose. His fasting glucose of 237 mg/dL at 0500 this am was corrected @ 0555 and meal coverage given at 0901 and given meal coverage. Next cbg checked at 1140, only 2.5 hrs following the meal coverage resulting in a cbg of 94 ac lunch. Based on this small window of time, I would recommend.increasing the lantus to 70 units and continuing the meal coverage at 12 uniits tidwc.  However if significant amts of correction are needed before lunch, supper and at bedtime, would increase the meal coverage to 16 units tidwc depending on amt correction needed. Will revisit tomorrow and follow glucose/insulin regimen.  Thank you, Rosita Kea, RN, CNS, Diabetes Coordinator [phone])

## 2013-03-29 LAB — GLUCOSE, CAPILLARY
GLUCOSE-CAPILLARY: 201 mg/dL — AB (ref 70–99)
GLUCOSE-CAPILLARY: 274 mg/dL — AB (ref 70–99)
GLUCOSE-CAPILLARY: 131 mg/dL — AB (ref 70–99)
Glucose-Capillary: 175 mg/dL — ABNORMAL HIGH (ref 70–99)

## 2013-03-29 LAB — BASIC METABOLIC PANEL
BUN: 14 mg/dL (ref 6–23)
CHLORIDE: 94 meq/L — AB (ref 96–112)
CO2: 32 mEq/L (ref 19–32)
Calcium: 9.4 mg/dL (ref 8.4–10.5)
Creatinine, Ser: 0.95 mg/dL (ref 0.50–1.35)
GFR calc non Af Amer: >90 mL/min (ref >90)
Glucose, Bld: 187 mg/dL — ABNORMAL HIGH (ref 70–99)
POTASSIUM: 3.8 meq/L (ref 3.7–5.3)
SODIUM: 137 meq/L (ref 137–147)

## 2013-03-29 MED ORDER — FUROSEMIDE 40 MG PO TABS
60.0000 mg | ORAL_TABLET | Freq: Two times a day (BID) | ORAL | Status: DC
  Administered 2013-03-29 – 2013-03-30 (×2): 60 mg via ORAL
  Filled 2013-03-29 (×5): qty 1

## 2013-03-29 MED ORDER — INSULIN GLARGINE 100 UNIT/ML ~~LOC~~ SOLN
70.0000 [IU] | Freq: Every day | SUBCUTANEOUS | Status: DC
  Administered 2013-03-29: 70 [IU] via SUBCUTANEOUS
  Filled 2013-03-29 (×2): qty 0.7

## 2013-03-29 MED ORDER — DM-GUAIFENESIN ER 30-600 MG PO TB12
1.0000 | ORAL_TABLET | Freq: Two times a day (BID) | ORAL | Status: DC
  Administered 2013-03-29 – 2013-03-30 (×3): 1 via ORAL
  Filled 2013-03-29 (×4): qty 1

## 2013-03-29 MED ORDER — INSULIN ASPART 100 UNIT/ML ~~LOC~~ SOLN
20.0000 [IU] | Freq: Three times a day (TID) | SUBCUTANEOUS | Status: DC
  Administered 2013-03-29 – 2013-03-30 (×2): 20 [IU] via SUBCUTANEOUS

## 2013-03-29 NOTE — Progress Notes (Signed)
53 year old male with new onset CHF with preserved ejection fraction - history of uncontrolled hypertension. Seem patient with IM team today. Clinically improving - breathing better, swelling in legs down, lungs clear. Keeping high blood pressures. I have examined and evaluated this patient in the morning rounds with my IM team and I have discussed the care plan with them. I have reviewed Dr Stann Mainland note and agree with the documentation. Please see details of management in Dr Stann Mainland note from today.

## 2013-03-29 NOTE — Consult Note (Signed)
Heart Failure Navigator Consult Note  Presentation: [name] is a 53 year old man with a history of diastolic heart failure, hypertension, hyperlipidemia, diabetes, and peripheral vascular occlusive disease who presents with a four-day history of increasing shortness of breath, orthopnea, and paroxysmal nocturnal dyspnea. His symptoms are worse with lying down and improved with sitting up. He notes that because of financial reasons he is not taking any antihypertensives for the last 2-3 months. He denies any fevers, shakes, chills, nausea, or vomiting. He does have lower extremity edema. Because of the progressive nature of his dyspnea he presented to the emergency department and was admitted to the internal medicine teaching service for further evaluation and care.  Upon evaluation Mr. Pound was felt to have an acute decompensation of his diastolic heart failure secondary to uncontrolled hypertension. The increased afterload was related to the inability to afford his antihypertensive medications. He has been diuresed with IV Lasix resulting in moderate urine output with a net loss of about 1 L over the first 24 hours. His afterload has been addressed with an ARB as well as the loop diuretic diuresis. We will increase the ARB dose in order to improve his afterload control and increase the Lasix dose to accelerate diuresis. We spent considerable time educating him on the importance of blood pressure control with regards to management of his heart failure. Although he is feeling improved today he is not near his baseline. We will continue with the above plan until he has marked improvement in his symptoms to baseline and is noted to have a slight increase in his BUN. At that point we will consider him to be near his dry weight and ready for discharge. I anticipate this will be in the next 2-3 days   Past Medical History  Diagnosis Date  . Hyperlipidemia   . Hypertension   . Osteomyelitis 2010    left  foot, s/p midfoot amputation  . 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 Neuromuscular disorder     diabetic neruopathy  . PAD (peripheral artery disease)     ABIs 11/30/11: L ABI 0.68, R ABI 0.84  . Pneumonia 2010  . Critical lower limb ischemia, lt with ABI of 0.60 12/31/2011  . PVD (peripheral vascular disease) 12/31/2011  . S/P angioplasty with stent, 12/30/11, of Lt SFA, Post. tibialis and PTA of L. ant and post. tibial vessels 12/31/2011  . Type II diabetes mellitus ~ 2002  . GERD (gastroesophageal reflux disease)   . CHF (congestive heart failure)     History   Social History  . Marital Status: Single    Spouse Name: N/A    Number of Children: N/A  . Years of Education: 12th   Occupational History  .  [employer]   Social History Main Topics  . Smoking status: Former Smoker -- 0.25 packs/day for 24 years    Types: Cigarettes    Quit date: 04/15/2005  . Smokeless tobacco: Never Used  . Alcohol Use: Yes     Comment: 06/01/2012 "~ once/month I have 2-3 mixed drinks"  . Drug Use: No  . Sexual Activity: Yes    Partners: Female    Birth Control/ Protection: Condom     Comment: one partner   Other Topics Concern  . 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 There was moderate concentric hypertrophy. Systolic function was normal. The estimated ejection fraction was in the range of 55% to 60%. Wall motion was normal; there were no regional wall motion abnormalities. Doppler parameters are consistent with abnormal left ventricular relaxation (grade 1 diastolic dysfunction). The E/e' ratio is <10, suggesting normal LV filling pressure. - Mitral  valve: Mildly thickened leaflets . Mild regurgitation. - Left atrium: The atrium was normal in size. - Inferior vena cava: The vessel was normal in size; the respirophasic diameter changes were in the normal range (= 50%); findings are consistent with normal central venous pressure. - Pericardium, extracardiac: There was no pericardial effusion.   BNP    Component Value Date/Time   PROBNP 1700.0* 03/27/2013 0425    Education Assessment and Provision:  Detailed education and instructions provided on heart failure disease management including the following:  Signs and symptoms of Heart Failure When to call the physician Importance of daily weights Low sodium diet  Fluid restriction Medication management Anticipated future follow-up appointments  Patient education given on each of the above topics.  Patient acknowledges understanding and acceptance of all instructions.  I spoke with [name] at length concerning his heart failure.  He claims to know "nothing" of heart failure.  He says that prior to admission he had been eating chicken noodle soup for approx 3 days for he thought was a cold.  We reviewed low sodium diet.  He says that he will have no problems with daily weights.  He does admit to stopping his "blood pressure medicine" because he took 3 and was not sure "he needed all of them".  We also discussed taking all medications as ordered and not skipping any medications.  Education Materials:  "Living Better With Heart Failure" Booklet, Daily Weight Tracker Tool and Heart Failure Educational Video.   High Risk Criteria for Readmission and/or Poor Patient Outcomes:    EF <30%- No  2 or more admissions in 6 months-No  Difficult social situation- No  Demonstrates medication noncompliance- No    Barriers of Care:  Knowledge of medical conditions and compliance related to financial issues.  Discharge Planning:   Patient plans to discharge to home alone.  Notes to  Patient's Outpatient Care Team for Continued Management in the Community:  Patient will need  structured follow-up and compliance reinforcement as well as ongoing HF education.  He may also need some form of medication assistance to keep him compliant to his medications.

## 2013-03-29 NOTE — Progress Notes (Signed)
1830 conversant . No distress noted

## 2013-03-29 NOTE — Progress Notes (Signed)
Subjective: Mr. Espinel is doing well this morning, eating well, states his breathing and leg swelling are improved.  Feels somewhat congested, requesting medication for that.   Objective: Vital signs in last 24 hours: Filed Vitals:   03/28/13 1519 03/28/13 1921 03/28/13 2033 03/29/13 0556  BP: 194/97 176/81 162/92 163/82  Pulse: 108 95 111 99  Temp: 98.6 F (37 C)  98.5 F (36.9 C) 98 F (36.7 C)  TempSrc: Oral  Oral Oral  Resp: 18  18 18   Height:      Weight:    186 lb 9.6 oz (84.641 kg)  SpO2: 96% 95% 92% 94%   Weight change: -4 lb 1.6 oz (-1.859 kg)  Intake/Output Summary (Last 24 hours) at 03/29/13 0834 Last data filed at 03/29/13 0606  Gross per 24 hour  Intake   1178 ml  Output   2050 ml  Net   -872 ml   PEX General: alert, cooperative, and in no apparent distress HEENT: NCAT, vision grossly intact, oropharynx clear and non-erythematous  Neck: supple, no lymphadenopathy Lungs: minimal crackles in bilateral lower lung fields, normal work of respiration, no wheezes, ronchi Heart: mildly tachycardic, regular rate and rhythm, no murmurs, gallops, or rubs Abdomen: soft, non-tender, non-distended, normal bowel sounds Extremities: no edema in RLE, trace in LLE, 2+ DP/PT pulses bilaterally, no cyanosis, clubbing Neurologic: alert & oriented X3, cranial nerves II-XII intact, strength grossly intact, sensation intact to light touch  Lab Results: Basic Metabolic Panel:  Recent Labs Lab 03/27/13 1030  03/28/13 2132 03/29/13 0620  NA  --   < > 133* 137  K  --   < > 4.1 3.8  CL  --   < > 92* 94*  CO2  --   < > 30 32  GLUCOSE  --   < > 370* 187*  BUN  --   < > 15 14  CREATININE  --   < > 1.11 0.95  CALCIUM  --   < > 9.2 9.4  MG 1.7  --   --   --   < > = values in this interval not displayed. Liver Function Tests:  Recent Labs Lab 03/27/13 1030  AST 19  ALT 24  ALKPHOS 108  BILITOT 0.3  PROT 6.6  ALBUMIN 2.6*   CBC:  Recent Labs Lab 03/27/13 0425  WBC  7.5  HGB 10.8*  HCT 32.5*  MCV 89.0  PLT 385   Cardiac Enzymes:  Recent Labs Lab 03/27/13 1030 03/27/13 1927  TROPONINI <0.30 <0.30   BNP:  Recent Labs Lab 03/27/13 0425  PROBNP 1700.0*   CBG:  Recent Labs Lab 03/27/13 2146 03/28/13 0541 03/28/13 1140 03/28/13 1620 03/28/13 2107 03/29/13 0551  GLUCAP 214* 237* 94 169* 332* 175*   Thyroid Function Tests:  Recent Labs Lab 03/27/13 1030  TSH 1.513   Medications: I have reviewed the patient's current medications. Scheduled Meds: . aspirin  325 mg Oral Daily  . atorvastatin  10 mg Oral q1800  . enoxaparin (LOVENOX) injection  40 mg Subcutaneous Q24H  . furosemide  80 mg Intravenous BID  . gabapentin  600 mg Oral TID  . insulin aspart  0-15 Units Subcutaneous TID WC  . insulin aspart  0-5 Units Subcutaneous QHS  . insulin aspart  12 Units Subcutaneous TID WC  . insulin glargine  70 Units Subcutaneous QHS  . irbesartan  300 mg Oral Daily  . sodium chloride  3 mL Intravenous Q12H  . sodium  chloride  3 mL Intravenous Q12H   Continuous Infusions:  PRN Meds:.sodium chloride, albuterol, benzonatate, hydrALAZINE, sodium chloride, traMADol Assessment/Plan: # Acute CHF exacerbation, improved - Patient presented with orthopnea, PND, and BLE edema with pro-BNP = 1700, consistent with acute CHF exacerbation.  Last echo in 03/2009 showed grade 2 diastolic dysfunction with EF 45-50%.  Repeat echo showed EF 55-60% with normal systolic function, grade 1 diastolic dysfunction.  Etiology of this CHF exacerbation likely secondary to HCTZ non-compliance vs gradual worsening of CHF. Symptoms not likely due to PNA (no leukocytosis, CXR showed only pulm edema) vs PE (Wells score = 1.5 (low probability), symptoms better explained by CHF). Patient is s/p Lasix 40 mg IV x 3 on 3/24, Lasix 80 mg IV x 2 on 3/25; net out 1.8L this admission.  Last weight 190 pounds ~1 month ago --> 189 --> 186 today.  Troponin x 2 negative, TSH 1.513 (within  normal limits) -convert to Lasix PO 60 BID -daily weights, strict I/Os  -continue irbesartan, aspirin, lipitor  -will initiate beta blocker at discharge (Coreg 3.125 mg BID)  # Uncontrolled HTN, improved - Patient presented with BP in [id number], now [id number] systolic. Volume overload likely contributing significantly to the patient's elevated BP.  -continue diuresis with Lasix per above  -increase irbesartan to 300 mg daily, will initiate BB at discharge per above -hydralazine prn for SBP > 160 (patient has not required any doses thus far)  # Uncontrolled DM2 - last A1C > 14 (02/15/13).  Patient has had trouble affording insulin in the outpatient setting but notes recent compliance with Lantus 60 daily, and Novolog 27 units BID.  -consult to diabetes educator for recommendations on insulin regimen  -increase Lantus to 70 units qhs  -continue Novolog 12 units TID with meals plus SSI   # Diabetic foot ulcer - Patient has a right 1st toe diabetic foot ulcer, followed by the wound care center, s/p skin graft.  Dressing is changed weekly at wound care. -wound care consult, appreciate recs; applied silver dressing to right first toe    Dispo:  Anticipated discharge tomorrow.   The patient does have a current PCP (Neema Bobbie Stack, MD) and does need an [facility] hospital follow-up appointment after discharge.   .Services Needed at time of discharge: Y = Yes, Blank = No PT:   OT:   RN:   Equipment:   Other:     LOS: 2 days   Ivin Poot, MD 03/29/2013, 8:34 AM

## 2013-03-29 NOTE — Progress Notes (Signed)
Inpatient Diabetes Program Recommendations  AACE/ADA: New Consensus Statement on Inpatient Glycemic Control (2013)  Target Ranges:  Prepandial:   less than 140 mg/dL      Peak postprandial:   less than 180 mg/dL (1-2 hours)      Critically ill patients:  140 - 180 mg/dL   Reason for Visit: Referral for assistance with insulin regimen for discharge. Agree with increase in basal lantus to 70 units (pt states he took 72 units at home when had the lantus).  However, pt needs quite a bit more meal coverage, meal coverage in addition to correction insulin is still not enough coverage to control post-prandial. Please increase to 20 units tidwc at the least to start.  Inpatient Diabetes Program Recommendations Insulin - Basal: Noted increase of lantus dose to 70 units which will help with basal needs and normalize fasting cbg. Insulin - Meal Coverage: Meal coverage remains at 12 units tidwc.  Post-prandial glucose levels are extremely high into 300 range.Pt takes 27 units novolog at home tid with meals.  Thank you, Rosita Kea, RN, CNS, Diabetes Coordinator [phone])

## 2013-03-30 LAB — BASIC METABOLIC PANEL
BUN: 16 mg/dL (ref 6–23)
CALCIUM: 9.4 mg/dL (ref 8.4–10.5)
CO2: 30 mEq/L (ref 19–32)
CREATININE: 1.07 mg/dL (ref 0.50–1.35)
Chloride: 92 mEq/L — ABNORMAL LOW (ref 96–112)
GFR calc Af Amer: 90 mL/min — ABNORMAL LOW (ref >90)
GFR calc non Af Amer: 77 mL/min — ABNORMAL LOW (ref >90)
GLUCOSE: 165 mg/dL — AB (ref 70–99)
Potassium: 4.2 mEq/L (ref 3.7–5.3)
Sodium: 136 mEq/L — ABNORMAL LOW (ref 137–147)

## 2013-03-30 LAB — GLUCOSE, CAPILLARY
GLUCOSE-CAPILLARY: 155 mg/dL — AB (ref 70–99)
GLUCOSE-CAPILLARY: 53 mg/dL — AB (ref 70–99)
GLUCOSE-CAPILLARY: 94 mg/dL (ref 70–99)

## 2013-03-30 MED ORDER — CARVEDILOL 3.125 MG PO TABS: 3.1250 mg | ORAL_TABLET | Freq: Two times a day (BID) | ORAL | Status: DC

## 2013-03-30 MED ORDER — CARVEDILOL 3.125 MG PO TABS
3.1250 mg | ORAL_TABLET | Freq: Two times a day (BID) | ORAL | Status: DC
  Administered 2013-03-30: 3.125 mg via ORAL
  Filled 2013-03-30 (×3): qty 1

## 2013-03-30 MED ORDER — INSULIN GLARGINE 100 UNIT/ML ~~LOC~~ SOLN: 70.0000 [IU] | Freq: Every day | SUBCUTANEOUS | Status: DC

## 2013-03-30 MED ORDER — INSULIN ASPART 100 UNIT/ML ~~LOC~~ SOLN: 20.0000 [IU] | Freq: Three times a day (TID) | SUBCUTANEOUS | Status: DC

## 2013-03-30 MED ORDER — FUROSEMIDE 20 MG PO TABS: 60.0000 mg | ORAL_TABLET | Freq: Two times a day (BID) | ORAL | Status: DC

## 2013-03-30 MED ORDER — OLMESARTAN MEDOXOMIL 40 MG PO TABS: 20.0000 mg | ORAL_TABLET | Freq: Every day | ORAL | Status: DC

## 2013-03-30 NOTE — Discharge Summary (Signed)
Name: [name] MRN: [id number] DOB: 1960-12-30 53 y.o. PCP: [name] Boyer, MD  Date of Admission: 03/27/2013  4:39 AM Date of Discharge: 03/30/2013 Attending Physician: [name] Fireman, MD  Discharge Diagnosis: Principal Problem:   Acute CHF Active Problems:   Diabetes mellitus type 2, uncontrolled, with complications   HYPERLIPIDEMIA   HYPERTENSION   PVD (peripheral vascular disease)   S/P angioplasty with stent, 12/30/11, of Lt SFA, Post. tibialis and PTA of L. ant and post. 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     aspirin 325 MG EC tablet  Take 325 mg by mouth daily.     carvedilol 3.125 MG tablet  Commonly known as:  COREG  Take 1 tablet (3.125 mg total) by mouth 2 (two) times daily with a meal.     furosemide 20 MG tablet  Commonly known as:  LASIX  Take 3 tablets (60 mg total) by mouth 2 (two) times daily.     gabapentin 300 MG capsule  Commonly known as:  NEURONTIN  Take 600 mg by mouth 3 (three) times daily.     insulin aspart 100 UNIT/ML injection  Commonly known as:  novoLOG  Inject 20 Units into the skin 3 (three) times daily with meals.     insulin glargine 100 UNIT/ML injection  Commonly known as:  LANTUS  Inject 0.7 mLs (70 Units total) into the skin at bedtime.     metFORMIN 1000 MG tablet  Commonly known as:  GLUCOPHAGE  Take 1,000 mg by mouth 2 (two) times daily with a meal.     olmesartan 40 MG tablet  Commonly known as:  BENICAR  Take 0.5 tablets (20 mg total) by mouth daily.     rosuvastatin 5 MG tablet  Commonly known as:   CRESTOR  Take 5 mg by mouth at bedtime.     traMADol 50 MG tablet  Commonly known as:  ULTRAM  Take 50 mg by mouth every 6 (six) hours as needed for moderate pain.        Disposition and follow-up:   [name] was discharged from [facility] in Stable condition.  At the hospital follow up visit please address:  1. Status of breathing, leg swelling  2.  BP check  3.  Diabetes control (discharged on Lantus 70 qhs, Novolog 20 units AC)  4.  Labs / imaging needed at time of follow-up: BMP (check Cr, K)  5.  Pending labs/ test needing follow-up: none  Follow-up Appointments: Follow-up Information   Follow up with [name], MD On 04/05/2013. (2:45pm)    Specialty:  Internal Medicine   Contact information:   [place] [address] [phone]       Discharge Instructions: Discharge Orders   Future Appointments Provider Department Dept Phone   04/05/2013 2:45 PM [name], MD [name] Internal [place] [phone]   Future Orders Complete By Expires   ([place]) Call MD:  Anytime you have  any of the following symptoms: 1) 3 pound weight gain in 24 hours or 5 pounds in 1 week 2) shortness of breath, with or without a dry hacking cough 3) swelling in the hands, feet or stomach 4) if you have to sleep on extra pillows at night in order to breathe.  As directed    Diet - low sodium heart healthy  As directed    Increase activity slowly  As directed       Consultations:  none  Procedures Performed:  Dg Chest 2 View  03/27/2013   CLINICAL DATA:  Shortness of breath.  EXAM: CHEST  2 VIEW  COMPARISON:  Chest x-ray 06/29/2012.  FINDINGS: There is cephalization of the pulmonary vasculature, indistinctness of the interstitial markings, and patchy airspace disease throughout the lungs bilaterally suggestive of moderate pulmonary edema. Small bilateral pleural effusions. Mild cardiomegaly.  Atherosclerosis in the thoracic aorta.  IMPRESSION: 1. The appearance of the chest suggests congestive heart failure, as above.   Electronically Signed   By: Vinnie Langton M.D.   On: 03/27/2013 05:23    2D Echo: EF 55-60% with normal systolic function, grade 1 diastolic dysfunction   Admission HPI:  The patient is a 53 yo man, history of dCHF (EF 45-50%, g2 dd, 03/2009), HTN, HL, PVD, DM2, presenting with orthopnea and PND. The patient notes a 4-day history of symptoms of feeling short of breath when lying on his back, and awakening in the middle of the night gasping for air, as well as increasing LE edema. Symptoms are improved by sitting up, or sleeping in an upright position. The patient notes no DOE, and states he feels "completely fine" as long as he is awake and in an upright position. The patient notes not taking his HCTZ for the last 2 months, due to financial constraints. He notes no fevers, chills, chest pain, nausea, or vomiting, though he notes feeling "bloated" at times.   Hospital Course by problem list: 1. Acute CHF exacerbation, resolved - Patient presented with orthopnea, PND, and BLE edema with pro-BNP 1700, consistent with acute CHF exacerbation. Last echo in 03/2009 showed grade 2 diastolic dysfunction with EF 45-50%. Repeat echo this admission showed EF 55-60% with normal systolic function, grade 1 diastolic dysfunction. Etiology of this CHF exacerbation likely secondary to HCTZ non-compliance vs gradual worsening of CHF. Unlikely PNA (no leukocytosis, CXR showed only pulmonary edema), PE (Wells score = 1.5 (low probability), symptoms better explained by CHF). Patient diuresed with Lasix IV initially then transitioned to PO; out ~2.5L this admission though no urine collection since 3/26 afternoon due to patient noncompliance. Last weight 190 pounds ~1 month ago --> 189 --> 186 --> 183 on day of discharge. Troponin x 2 negative, TSH 1.513 (within normal limits).  Continued Lasix PO 60  BID at discharge, ARB (though increased dose), Lipitor, ASA while inpatient and at discharge.  Started Coreg 3.125 mg BID on day of discharge.   2. Uncontrolled HTN, improved - Patient presented with BP in 170s/90s, controlled at Q000111Q systolic on day of discharge. Volume overload was likely contributing significantly to elevated BP.  Lasix 60 mg BID, olmesartan 40 mg daily (increased from 20 mg daily), Coreg 3.125 mg BID at discharge.    3. Uncontrolled DM2 - last A1C >14% (02/15/13). Patient reports compliance with Lantus 60 daily and Novolog 27 units BID (only eats twice daily at home), has orange card.  Increased Lantus to 70 units qhs given high fasting BGs, changed to Novolog  20 units TID AC (patient states he will only take it BID at home).    4. Diabetic foot ulcer, stable - Patient has right 1st toe diabetic foot ulcer, followed by wound care center, s/p skin graft; dressing is changed weekly at wound care.  Consulted wound care while inpatient, applied silver dressing to right first toe.   Discharge Vitals:   BP 125/74  Pulse 103  Temp(Src) 98.5 F (36.9 C) (Oral)  Resp 18  Ht 6' (1.829 m)  Wt 183 lb 8 oz (83.235 kg)  BMI 24.88 kg/m2  SpO2 98%  Discharge Labs:  Results for orders placed during the hospital encounter of 03/27/13 (from the past 24 hour(s))  GLUCOSE, CAPILLARY     Status: Abnormal   Collection Time    03/29/13  4:38 PM      Result Value Ref Range   Glucose-Capillary 274 (*) 70 - 99 mg/dL  GLUCOSE, CAPILLARY     Status: Abnormal   Collection Time    03/29/13  9:32 PM      Result Value Ref Range   Glucose-Capillary 131 (*) 70 - 99 mg/dL   Comment 1 Notify RN     Comment 2 Documented in Chart    BASIC METABOLIC PANEL     Status: Abnormal   Collection Time    03/30/13  5:03 AM      Result Value Ref Range   Sodium 136 (*) 137 - 147 mEq/L   Potassium 4.2  3.7 - 5.3 mEq/L   Chloride 92 (*) 96 - 112 mEq/L   CO2 30  19 - 32 mEq/L   Glucose, Bld 165 (*) 70 - 99  mg/dL   BUN 16  6 - 23 mg/dL   Creatinine, Ser 1.07  0.50 - 1.35 mg/dL   Calcium 9.4  8.4 - 10.5 mg/dL   GFR calc non Af Amer 77 (*) >90 mL/min   GFR calc Af Amer 90 (*) >90 mL/min  GLUCOSE, CAPILLARY     Status: Abnormal   Collection Time    03/30/13  5:39 AM      Result Value Ref Range   Glucose-Capillary 155 (*) 70 - 99 mg/dL  GLUCOSE, CAPILLARY     Status: Abnormal   Collection Time    03/30/13 11:23 AM      Result Value Ref Range   Glucose-Capillary 53 (*) 70 - 99 mg/dL   Comment 1 Documented in Chart    GLUCOSE, CAPILLARY     Status: None   Collection Time    03/30/13 11:48 AM      Result Value Ref Range   Glucose-Capillary 94  70 - 99 mg/dL   Comment 1 Notify RN      Signed: Ivin Poot, MD 03/30/2013, 1:16 PM   Time Spent on Discharge: 35 minutes Services Ordered on Discharge: none Equipment Ordered on Discharge: none

## 2013-03-30 NOTE — Discharge Instructions (Signed)
Please be sure to take your medicines exactly as prescribed in this paperwork!  We made several changes to your home medicines.  1. Started Lasix (60 mg twice daily) for fluid 2. Started Coreg (3.125 mg twice daily) for heart failure 3. Increased Benicar (40 mg daily) for blood pressure 4. Increased Lantus (to 70 units daily) for diabetes 5. Increased Novolog (to 20 units THREE times daily with meals) for diabetes (You are no longer on hydrochlorothiazide.)  Don't forget your follow-up appointment in [facility] next week!   Heart Failure Heart failure means your heart has trouble pumping blood. This makes it hard for your body to work well. Heart failure is usually a long-term (chronic) condition. You must take good care of yourself and follow your doctor's treatment plan. HOME CARE  Take your heart medicine as told by your doctor.  Do not stop taking medicine unless your doctor tells you to.  Do not skip any dose of medicine.  Refill your medicines before they run out.  Take other medicines only as told by your doctor or pharmacist.  Stay active if told by your doctor. The elderly and people with severe heart failure should talk with a doctor about physical activity.  Eat heart healthy foods. Choose foods that are without trans fat and are low in saturated fat, cholesterol, and salt (sodium). This includes fresh or frozen fruits and vegetables, fish, lean meats, fat-free or low-fat dairy foods, whole grains, and high-fiber foods. Lentils and dried peas and beans (legumes) are also good choices.  Limit salt if told by your doctor.  Cook in a healthy way. Roast, grill, broil, bake, poach, steam, or stir-fry foods.  Limit fluids as told by your doctor.  Weigh yourself every morning. Do this after you pee (urinate) and before you eat breakfast. Write down your weight to give to your doctor.  Take your blood pressure and write it down if your doctor tell you to.  Ask your doctor how to  check your pulse. Check your pulse as told.  Lose weight if told by your doctor.  Stop smoking or chewing tobacco. Do not use gum or patches that help you quit without your doctor's approval.  Schedule and go to doctor visits as told.  Nonpregnant women should have no more than 1 drink a day. Men should have no more than 2 drinks a day. Talk to your doctor about drinking alcohol.  Stop illegal drug use.  Stay current with shots (immunizations).  Manage your health conditions as told by your doctor.  Learn to manage your stress.  Rest when you are tired.  If it is really hot outside:  Avoid intense activities.  Use air conditioning or fans, or get in a cooler place.  Avoid caffeine and alcohol.  Wear loose-fitting, lightweight, and light-colored clothing.  If it is really cold outside:  Avoid intense activities.  Layer your clothing.  Wear mittens or gloves, a hat, and a scarf when going outside.  Avoid alcohol.  Learn about heart failure and get support as needed.  Get help to maintain or improve your quality of life and your ability to care for yourself as needed. GET HELP IF:   You gain 03 lb/1.4 kg or more in 1 day or 05 lb/2.3 kg in a week.  You are more short of breath than usual.  You cannot do your normal activities.  You tire easily.  You cough more than normal, especially with activity.  You have any or  more puffiness (swelling) in areas such as your hands, feet, ankles, or belly (abdomen).  You cannot sleep because it is hard to breathe.  You feel like your heart is beating fast (palpitations).  You get dizzy or lightheaded when you stand up. GET HELP RIGHT AWAY IF:   You have trouble breathing.  There is a change in mental status, such as becoming less alert or not being able to focus.  You have chest pain or discomfort.  You faint. MAKE SURE YOU:   Understand these instructions.  Will watch your condition.  Will get help right  away if you are not doing well or get worse. Document Released: 09/30/2007 Document Revised: 04/17/2012 Document Reviewed: 07/22/2011 [facility] Patient Information 2014 [place], [place].

## 2013-03-30 NOTE — Progress Notes (Signed)
1123 with hypoglycemia episode CBG 53 orange juice, graham crackers given  1145 repeat CBG 94 . Pt feeling better 1221 MD aware . No further orders given

## 2013-03-30 NOTE — Progress Notes (Signed)
Subjective: [name] is doing well this morning, ready to go home.    Per RN, patient has been urinating exclusively in toilet since early yesterday afternoon despite several reminders.   Objective: Vital signs in last 24 hours: Filed Vitals:   03/29/13 1011 03/29/13 1346 03/29/13 2047 03/30/13 0602  BP: 157/98 163/75 179/87 132/74  Pulse: 75 107 112 105  Temp:  98.9 F (37.2 C) 98.7 F (37.1 C) 98.5 F (36.9 C)  TempSrc:  Oral Oral Oral  Resp: 18 18 18 18   Height:      Weight:    183 lb 8 oz (83.235 kg)  SpO2: 98% 97% 99% 95%   Weight change: -3 lb 1.6 oz (-1.406 kg)  Intake/Output Summary (Last 24 hours) at 03/30/13 [id number] Last data filed at 03/30/13 0602  Gross per 24 hour  Intake    938 ml  Output    500 ml  Net    438 ml   PEX General: alert, cooperative, and in no apparent distress HEENT: NCAT, vision grossly intact, oropharynx clear and non-erythematous  Neck: supple, no lymphadenopathy Lungs: clear to auscultation bilaterally, normal work of respiration, no wheezes, rales, ronchi Heart: mildly tachycardic, regular rate and rhythm, no murmurs, gallops, or rubs Abdomen: soft, non-tender, non-distended, normal bowel sounds Extremities: trace edema in RLE, 2+ DP/PT pulses bilaterally, no cyanosis, clubbing Neurologic: alert & oriented X3, cranial nerves II-XII intact, strength grossly intact, sensation intact to light touch   Lab Results: Basic Metabolic Panel:  Recent Labs Lab 03/27/13 1030  03/29/13 0620 03/30/13 0503  NA  --   < > 137 136*  K  --   < > 3.8 4.2  CL  --   < > 94* 92*  CO2  --   < > 32 30  GLUCOSE  --   < > 187* 165*  BUN  --   < > 14 16  CREATININE  --   < > 0.95 1.07  CALCIUM  --   < > 9.4 9.4  MG 1.7  --   --   --   < > = values in this interval not displayed. Liver Function Tests:  Recent Labs Lab 03/27/13 1030  AST 19  ALT 24  ALKPHOS 108  BILITOT 0.3  PROT 6.6  ALBUMIN 2.6*   CBC:  Recent Labs Lab 03/27/13 0425    WBC 7.5  HGB 10.8*  HCT 32.5*  MCV 89.0  PLT 385   Cardiac Enzymes:  Recent Labs Lab 03/27/13 1030 03/27/13 1927  TROPONINI <0.30 <0.30   BNP:  Recent Labs Lab 03/27/13 0425  PROBNP 1700.0*   CBG:  Recent Labs Lab 03/28/13 2107 03/29/13 0551 03/29/13 1136 03/29/13 1638 03/29/13 2132 03/30/13 0539  GLUCAP 332* 175* 201* 274* 131* 155*   Thyroid Function Tests:  Recent Labs Lab 03/27/13 1030  TSH 1.513   Medications: I have reviewed the patient's current medications. Scheduled Meds: . aspirin  325 mg Oral Daily  . atorvastatin  10 mg Oral q1800  . dextromethorphan-guaiFENesin  1 tablet Oral BID  . enoxaparin (LOVENOX) injection  40 mg Subcutaneous Q24H  . furosemide  60 mg Oral BID  . gabapentin  600 mg Oral TID  . insulin aspart  0-15 Units Subcutaneous TID WC  . insulin aspart  0-5 Units Subcutaneous QHS  . insulin aspart  20 Units Subcutaneous TID WC  . insulin glargine  70 Units Subcutaneous QHS  . irbesartan  300 mg Oral  Daily  . sodium chloride  3 mL Intravenous Q12H  . sodium chloride  3 mL Intravenous Q12H   :  PRN Meds:.sodium chloride, albuterol, benzonatate, hydrALAZINE, sodium chloride, traMADol Assessment/Plan: # Acute CHF exacerbation, resolved - Patient presented with orthopnea, PND, and BLE edema with pro-BNP = 1700, consistent with acute CHF exacerbation.  Last echo in 03/2009 showed grade 2 diastolic dysfunction with EF 45-50%.  Repeat echo this admission showed EF 55-60% with normal systolic function, grade 1 diastolic dysfunction.  Etiology of this CHF exacerbation likely secondary to HCTZ non-compliance vs gradual worsening of CHF.  Symptoms not likely due to PNA (no leukocytosis, CXR showed only pulm edema) vs PE (Wells score = 1.5 (low probability), symptoms better explained by CHF). Patient is out ~2.5L this admission though no collection since 3/26 afternoon due to patient urinating in toilet.  Last weight 190 pounds ~1 month ago -->  189 --> 186 --> 183 today.  Troponin x 2 negative, TSH 1.513 (within normal limits) -continue Lasix PO 60 BID, will discharge patient on this dose -daily weights, strict I/Os through discharge -continue irbesartan, aspirin, lipitor  -start Coreg 3.125 mg BID   # Uncontrolled HTN, improved - Patient presented with BP in 170s/90s, now controlled at Q000111Q systolic. Volume overload likely contributing significantly to the patient's elevated BP.  -continue diuresis with Lasix per above  -continue irbesartan 300 mg daily, intiating BB at discharge per above  # Uncontrolled DM2 - last A1C > 14 (02/15/13).  Patient has had trouble affording insulin in the outpatient setting but notes recent compliance with Lantus 60 daily, and Novolog 27 units BID.  -diabetes educator consult, appreciate recs -continue Lantus to 70 units qhs  -continue Novolog 20 units TID with meals plus SSI   # Diabetic foot ulcer - Patient has a right 1st toe diabetic foot ulcer, followed by the wound care center, s/p skin graft.  Dressing is changed weekly at wound care. -wound care consult, appreciate recs; applied silver dressing to right first toe    Dispo:  Anticipated discharge today.   The patient does have a current PCP (Neema Bobbie Stack, MD) and does need an [facility] hospital follow-up appointment after discharge.   .Services Needed at time of discharge: Y = Yes, Blank = No PT:   OT:   RN:   Equipment:   Other:     LOS: 3 days   Ivin Poot, MD 03/30/2013, 7:22 AM

## 2013-03-30 NOTE — Progress Notes (Signed)
Utilization Review Completed Tera Pellicane J. Aleiah Mohammed, RN, BSN, NCM [phone]  

## 2013-03-30 NOTE — Progress Notes (Signed)
Discharge instructions and prescriptions given to pt . Verbalized understanding

## 2013-04-05 ENCOUNTER — Encounter: Payer: Self-pay | Admitting: Internal Medicine

## 2013-04-05 ENCOUNTER — Ambulatory Visit (INDEPENDENT_AMBULATORY_CARE_PROVIDER_SITE_OTHER): Payer: No Typology Code available for payment source | Admitting: Internal Medicine

## 2013-04-05 VITALS — BP 140/78 | HR 110 | Temp 98.0°F | Wt 187.5 lb

## 2013-04-05 DIAGNOSIS — IMO0001 Reserved for inherently not codable concepts without codable children: Secondary | ICD-10-CM

## 2013-04-05 DIAGNOSIS — E118 Type 2 diabetes mellitus with unspecified complications: Principal | ICD-10-CM

## 2013-04-05 DIAGNOSIS — I10 Essential (primary) hypertension: Secondary | ICD-10-CM

## 2013-04-05 DIAGNOSIS — IMO0002 Reserved for concepts with insufficient information to code with codable children: Secondary | ICD-10-CM

## 2013-04-05 DIAGNOSIS — I509 Heart failure, unspecified: Secondary | ICD-10-CM

## 2013-04-05 DIAGNOSIS — E1165 Type 2 diabetes mellitus with hyperglycemia: Secondary | ICD-10-CM

## 2013-04-05 LAB — BASIC METABOLIC PANEL WITH GFR
BUN: 40 mg/dL — ABNORMAL HIGH (ref 6–23)
CALCIUM: 8.9 mg/dL (ref 8.4–10.5)
CO2: 27 mEq/L (ref 19–32)
CREATININE: 1.61 mg/dL — AB (ref 0.50–1.35)
Chloride: 92 mEq/L — ABNORMAL LOW (ref 96–112)
GFR, EST NON AFRICAN AMERICAN: 48 mL/min — AB
GFR, Est African American: 56 mL/min — ABNORMAL LOW
Glucose, Bld: 399 mg/dL — ABNORMAL HIGH (ref 70–99)
Potassium: 4.3 mEq/L (ref 3.5–5.3)
Sodium: 130 mEq/L — ABNORMAL LOW (ref 135–145)

## 2013-04-05 LAB — GLUCOSE, CAPILLARY: Glucose-Capillary: 375 mg/dL — ABNORMAL HIGH (ref 70–99)

## 2013-04-05 MED ORDER — CARVEDILOL 3.125 MG PO TABS: 3.1250 mg | ORAL_TABLET | Freq: Two times a day (BID) | ORAL | Status: DC

## 2013-04-05 MED ORDER — FUROSEMIDE 20 MG PO TABS: 60.0000 mg | ORAL_TABLET | Freq: Two times a day (BID) | ORAL | Status: DC

## 2013-04-05 MED ORDER — ROSUVASTATIN CALCIUM 5 MG PO TABS: 5.0000 mg | ORAL_TABLET | Freq: Every day | ORAL | Status: DC

## 2013-04-05 NOTE — Discharge Summary (Signed)
INTERNAL MEDICINE ATTENDING DISCHARGE COSIGN   I evaluated the patient on the day of discharge and discussed the discharge plan with my resident team. I agree with the discharge documentation and disposition.   [name] 04/05/2013, 11:55 AM

## 2013-04-05 NOTE — Progress Notes (Signed)
Patient ID: [name], male   DOB: 1960/05/23, 53 y.o.   MRN: [id number]    Subjective:   Patient ID: [name] male   DOB: [birth year] 53 y.o.   MRN: [id number]  HPI: [name] is a 53 y.o. man pmhx of CHF who presents for hospital f/u after receiving treatment for acute CHF exacerbation. It was felt that the patients exacerbation was due to medication non-compliance. On admission lasix was increased to 60 mg BID and ARB dose was increased. Since discharge, the patient has been compliant with this regimen until the night proceeding his f/u appointment. He reports that the medicine takes away all his energy when he takes it at the same time. He found out that his granddaughter was being born and he did not take the meds because he wanted to be able to see her be born.   Denies fever, chills, N, V, contispation, diarrhea.. He denies PND. He states that his SOB is much improved since his admission.     Past Medical History  Diagnosis Date  . Hyperlipidemia   . Hypertension   . Osteomyelitis 2010    left foot, s/p midfoot amputation  . 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 Neuromuscular disorder     diabetic neruopathy  . PAD (peripheral artery disease)     ABIs 11/30/11: L ABI 0.68, R ABI 0.84  . Pneumonia 2010  . Critical lower limb ischemia, lt with ABI of 0.60 12/31/2011  . PVD (peripheral vascular disease) 12/31/2011  . S/P angioplasty with stent, 12/30/11, of Lt SFA, Post. tibialis and PTA of L. ant and post. tibial vessels 12/31/2011  . Type II diabetes mellitus ~ 2002  . GERD (gastroesophageal reflux disease)   . CHF (congestive heart failure)    Current Outpatient Prescriptions  Medication Sig Dispense Refill  . albuterol (PROVENTIL HFA;VENTOLIN HFA) 108 (90 BASE) MCG/ACT inhaler Inhale 1-2 puffs into the lungs every 6 (six) hours as needed for wheezing.  1 Inhaler  0  . aspirin 325 MG EC tablet Take 325 mg by mouth daily.      .  carvedilol (COREG) 3.125 MG tablet Take 1 tablet (3.125 mg total) by mouth 2 (two) times daily with a meal.  60 tablet  0  . furosemide (LASIX) 20 MG tablet Take 3 tablets (60 mg total) by mouth 2 (two) times daily.  180 tablet  0  . gabapentin (NEURONTIN) 300 MG capsule Take 600 mg by mouth 3 (three) times daily.      . insulin aspart (NOVOLOG) 100 UNIT/ML injection Inject 20 Units into the skin 3 (three) times daily with meals.  10 mL  11  . insulin glargine (LANTUS) 100 UNIT/ML injection Inject 0.7 mLs (70 Units total) into the skin at bedtime.  10 mL  11  . metFORMIN (GLUCOPHAGE) 1000 MG tablet Take 1,000 mg by mouth 2 (two) times daily with a meal.      . olmesartan (BENICAR) 40 MG tablet Take 0.5 tablets (20 mg total) by mouth daily.  30 tablet  0  . rosuvastatin (CRESTOR) 5 MG tablet Take 5 mg by mouth at bedtime.      . traMADol (ULTRAM) 50 MG tablet Take 50 mg by mouth every 6 (six) hours as needed for moderate pain.       No current facility-administered medications for this visit.   Family History  Problem Relation Age of Onset  . Diabetes  Mother   . Hypertension Brother   . Hypertension Sister   . Anesthesia problems Neg Hx    History   Social History  . Marital Status: Single    Spouse Name: N/A    Number of Children: N/A  . Years of Education: 12th   Occupational History  .  [employer]   Social History Main Topics  . Smoking status: Former Smoker -- 0.25 packs/day for 24 years    Types: Cigarettes    Quit date: 04/15/2005  . Smokeless tobacco: Never Used  . Alcohol Use: Yes     Comment: 06/01/2012 "~ once/month I have 2-3 mixed drinks"  . Drug Use: No  . Sexual Activity: Yes    Partners: Female    Birth Control/ Protection: Condom     Comment: one partner   Other Topics Concern  . 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 Objective:  Physical Exam: Filed Vitals:   04/05/13 1505  BP: 140/78  Pulse: 110  Temp: 98 F (36.7 C)  TempSrc: Oral  Weight: 187 lb 8 oz (85.049 kg)  SpO2: 97%   Physical Exam  Constitutional: He is oriented to person, place, and time. He appears well-developed and well-nourished. No distress.  HENT:  Head: Normocephalic and atraumatic.  Cardiovascular: Normal rate, regular rhythm, normal heart sounds and intact distal pulses.  Exam reveals no friction rub.   No murmur heard. Pulmonary/Chest: Effort normal and breath sounds normal. No respiratory distress. He has no wheezes. He has no rales.  Musculoskeletal: He exhibits no edema and no tenderness.  Neurological: He is alert and oriented to person, place, and time.  Skin: He is not diaphoretic.  Psychiatric: He has a normal mood and affect. His behavior is normal.    Assessment & Plan:

## 2013-04-05 NOTE — Patient Instructions (Addendum)
Take your medicines on the schedule we designed together today.  9 am : Benicar, furosemide, and Gabapentin  Lunch: Coreg  5 pm: Furosemide  Before Bed: Coreg and Gabapentin  Please follow up in 2-4 weeks for heart failure follow up.

## 2013-04-06 ENCOUNTER — Encounter (HOSPITAL_BASED_OUTPATIENT_CLINIC_OR_DEPARTMENT_OTHER): Payer: Medicaid Other | Attending: General Surgery

## 2013-04-06 DIAGNOSIS — E1169 Type 2 diabetes mellitus with other specified complication: Secondary | ICD-10-CM | POA: Insufficient documentation

## 2013-04-06 DIAGNOSIS — L97509 Non-pressure chronic ulcer of other part of unspecified foot with unspecified severity: Secondary | ICD-10-CM | POA: Insufficient documentation

## 2013-04-09 ENCOUNTER — Telehealth: Payer: Self-pay | Admitting: Internal Medicine

## 2013-04-09 NOTE — Assessment & Plan Note (Addendum)
Patient has difficulty with medication compliance. He reports symptoms when he takes all his medication at one time. I offered to decrease the dose of some of his medications. He stated that he would like to try taking the meds at different times throughout the day. We agreed on a new schedule for his medications as below:   9 am : Benicar, furosemide, and Gabapentin  Lunch: Coreg  5 pm: Furosemide  Before Bed: Coreg and Gabapentin  Furthermore, plan to check labs today as recent changes made in patients CHF meds.

## 2013-04-09 NOTE — Telephone Encounter (Signed)
I informed the patient of his abnormal lab results. I instructed patient to stop taking ARB and lasix. Patient reported orthostatic symptoms. I requested patient to come to clinic today. He stated that he could not come until Weds. I made an appt for the patient for 815 am with me on Weds. I instructed the patient to go to ED for new or worsening symptoms.

## 2013-04-09 NOTE — Assessment & Plan Note (Signed)
At goal. Plan to continue current management.

## 2013-04-09 NOTE — Assessment & Plan Note (Signed)
Patients CBG is elevated. This appears to be a chronic issue for this patient due to medication non compliance. The patient states that he did not take his meds due to distraction of granddaughters birth. States he will take his medications moving forward. Plan for f/u in 4-6 weeks.

## 2013-04-09 NOTE — Progress Notes (Signed)
Case discussed with Dr. Komanski at time of visit.  We reviewed the resident's history and exam and pertinent patient test results.  I agree with the assessment, diagnosis, and plan of care documented in the resident's note. 

## 2013-04-10 ENCOUNTER — Encounter: Payer: Self-pay | Admitting: Dietician

## 2013-04-11 ENCOUNTER — Ambulatory Visit (INDEPENDENT_AMBULATORY_CARE_PROVIDER_SITE_OTHER): Payer: No Typology Code available for payment source | Admitting: Internal Medicine

## 2013-04-11 ENCOUNTER — Encounter: Payer: Self-pay | Admitting: Internal Medicine

## 2013-04-11 VITALS — BP 121/77 | HR 98 | Temp 97.6°F | Wt 188.8 lb

## 2013-04-11 DIAGNOSIS — IMO0002 Reserved for concepts with insufficient information to code with codable children: Secondary | ICD-10-CM

## 2013-04-11 DIAGNOSIS — I509 Heart failure, unspecified: Secondary | ICD-10-CM

## 2013-04-11 DIAGNOSIS — E118 Type 2 diabetes mellitus with unspecified complications: Principal | ICD-10-CM

## 2013-04-11 DIAGNOSIS — I10 Essential (primary) hypertension: Secondary | ICD-10-CM

## 2013-04-11 DIAGNOSIS — R05 Cough: Secondary | ICD-10-CM

## 2013-04-11 DIAGNOSIS — R059 Cough, unspecified: Secondary | ICD-10-CM

## 2013-04-11 DIAGNOSIS — IMO0001 Reserved for inherently not codable concepts without codable children: Secondary | ICD-10-CM

## 2013-04-11 DIAGNOSIS — E1165 Type 2 diabetes mellitus with hyperglycemia: Secondary | ICD-10-CM

## 2013-04-11 LAB — COMPLETE METABOLIC PANEL WITH GFR
ALBUMIN: 2.8 g/dL — AB (ref 3.5–5.2)
ALK PHOS: 76 U/L (ref 39–117)
ALT: 7 U/L (ref 0–53)
AST: 8 U/L (ref 0–37)
BILIRUBIN TOTAL: 0.3 mg/dL (ref 0.3–1.2)
BUN: 21 mg/dL (ref 6–23)
CO2: 27 mEq/L (ref 19–32)
Calcium: 9.3 mg/dL (ref 8.4–10.5)
Chloride: 98 mEq/L (ref 96–112)
Creat: 1.18 mg/dL (ref 0.50–1.35)
GFR, Est African American: 81 mL/min
GFR, Est Non African American: 70 mL/min
GLUCOSE: 168 mg/dL — AB (ref 70–99)
POTASSIUM: 4.7 meq/L (ref 3.5–5.3)
SODIUM: 138 meq/L (ref 135–145)
Total Protein: 7.3 g/dL (ref 6.0–8.3)

## 2013-04-11 LAB — CBC WITH DIFFERENTIAL/PLATELET
BASOS ABS: 0 10*3/uL (ref 0.0–0.1)
Basophils Relative: 0 % (ref 0–1)
EOS ABS: 0.1 10*3/uL (ref 0.0–0.7)
Eosinophils Relative: 1 % (ref 0–5)
HCT: 31.3 % — ABNORMAL LOW (ref 39.0–52.0)
HEMOGLOBIN: 10.4 g/dL — AB (ref 13.0–17.0)
Lymphocytes Relative: 26 % (ref 12–46)
Lymphs Abs: 2.5 10*3/uL (ref 0.7–4.0)
MCH: 28.8 pg (ref 26.0–34.0)
MCHC: 33.2 g/dL (ref 30.0–36.0)
MCV: 86.7 fL (ref 78.0–100.0)
MONOS PCT: 6 % (ref 3–12)
Monocytes Absolute: 0.6 10*3/uL (ref 0.1–1.0)
Neutro Abs: 6.6 10*3/uL (ref 1.7–7.7)
Neutrophils Relative %: 67 % (ref 43–77)
Platelets: 382 10*3/uL (ref 150–400)
RBC: 3.61 MIL/uL — ABNORMAL LOW (ref 4.22–5.81)
RDW: 12.6 % (ref 11.5–15.5)
WBC: 9.8 10*3/uL (ref 4.0–10.5)

## 2013-04-11 MED ORDER — FUROSEMIDE 20 MG PO TABS: 20.0000 mg | ORAL_TABLET | Freq: Every day | ORAL | Status: DC

## 2013-04-11 NOTE — Assessment & Plan Note (Addendum)
A: Patient appears to not tolerate ARB due to cough. Plan to d/c ARB. As patient now has isolated diastolic dysfunction, there is no heart failure indication for ARB. I am checking stat labs to investigate patients kidney function. Performed orthostatic vital signs, which were negative.  BMET    Component Value Date/Time   NA 138 04/11/2013 0825   K 4.7 04/11/2013 0825   CL 98 04/11/2013 0825   CO2 27 04/11/2013 0825   GLUCOSE 168* 04/11/2013 0825   BUN 21 04/11/2013 0825   CREATININE 1.18 04/11/2013 0825   CREATININE 1.07 03/30/2013 0503   CALCIUM 9.3 04/11/2013 0825   GFRNONAA 70 04/11/2013 0825   GFRNONAA 77* 03/30/2013 0503   GFRAA 81 04/11/2013 0825   GFRAA 90* 03/30/2013 0503    P: Patients labs show resolution of AKI. Likely due to pre-renal azotemia due to overdiuresis and concomitant ARB. Plan to restart lasix at 20 mg daily, may need up-titration. As patients cough resolve with d/c ARB, plan to stop ARB.

## 2013-04-11 NOTE — Assessment & Plan Note (Signed)
Patients cough resolved with d/c of ARB. -

## 2013-04-11 NOTE — Progress Notes (Signed)
Patient ID: [name], male   DOB: [birth year], 53 y.o.   MRN: [id number]    Subjective:   Patient ID: [name] male   DOB: Apr 24, 1960 53 y.o.   MRN: [id number]  HPI: [name] is a 53 y.o. man with a pmhx of previous sCHF now dCHF who presents for follow up after recent labs indicated AKI. I saw the patient 6 days previous. Lab worked revealed likely dehydration and elevated Cr. Patient was recently d/c after beign treated for CHF exacerbation. He was d/c on 60 mg BID lasix and 20 mg qd ARB. The patient was complaining of a cough during his admission and my last visit. It was felt that this was due to CHF at that time. Due the abnormal lab results, I called the patient. The patient endorse orthostatic symptoms. I recommended that the patient come to clinic or ED immediately. He elected for a f/u appointment  In two days as he had things to deal with. I agreed with this plan. I instructed the patient to discontinue ARB and lasix. I encouraged oral hydration with water. The patient states that he feels much better since stopping these medications. Of note, his cough entirely resolved after discontinuation of ARB. Further, his orthostatic symptoms have resolved. Today, he does not report any LE swelling or SOB or PND.    Past Medical History  Diagnosis Date  . Hyperlipidemia   . Hypertension   . Osteomyelitis 2010    left foot, s/p midfoot amputation  . 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 Neuromuscular disorder     diabetic neruopathy  . PAD (peripheral artery disease)     ABIs 11/30/11: L ABI 0.68, R ABI 0.84  . Pneumonia 2010  . Critical lower limb ischemia, lt with ABI of 0.60 12/31/2011  . PVD (peripheral vascular disease) 12/31/2011  . S/P angioplasty with stent, 12/30/11, of Lt SFA, Post. tibialis and PTA of L. ant and post. tibial vessels 12/31/2011  . Type II diabetes mellitus ~ 2002  . GERD (gastroesophageal reflux disease)   . CHF  (congestive heart failure)    Current Outpatient Prescriptions  Medication Sig Dispense Refill  . albuterol (PROVENTIL HFA;VENTOLIN HFA) 108 (90 BASE) MCG/ACT inhaler Inhale 1-2 puffs into the lungs every 6 (six) hours as needed for wheezing.  1 Inhaler  0  . aspirin 325 MG EC tablet Take 325 mg by mouth daily.      . carvedilol (COREG) 3.125 MG tablet Take 1 tablet (3.125 mg total) by mouth 2 (two) times daily with a meal.  60 tablet  6  . gabapentin (NEURONTIN) 300 MG capsule Take 600 mg by mouth 3 (three) times daily.      . insulin aspart (NOVOLOG) 100 UNIT/ML injection Inject 20 Units into the skin 3 (three) times daily with meals.  10 mL  11  . insulin glargine (LANTUS) 100 UNIT/ML injection Inject 0.7 mLs (70 Units total) into the skin at bedtime.  10 mL  11  . metFORMIN (GLUCOPHAGE) 1000 MG tablet Take 1,000 mg by mouth 2 (two) times daily with a meal.      . rosuvastatin (CRESTOR) 5 MG tablet Take 1 tablet (5 mg total) by mouth at bedtime.  30 tablet  12  . traMADol (ULTRAM) 50 MG tablet Take 50 mg by mouth every 6 (six) hours as needed for moderate pain.      . furosemide (LASIX) 20 MG  tablet Take 3 tablets (60 mg total) by mouth 2 (two) times daily.  180 tablet  3  . olmesartan (BENICAR) 40 MG tablet Take 0.5 tablets (20 mg total) by mouth daily.  30 tablet  0   No current facility-administered medications for this visit.   Family History  Problem Relation Age of Onset  . Diabetes Mother   . Hypertension Brother   . Hypertension Sister   . Anesthesia problems Neg Hx    History   Social History  . Marital Status: Single    Spouse Name: N/A    Number of Children: N/A  . Years of Education: 12th   Occupational History  .  [employer]   Social History Main Topics  . Smoking status: Former Smoker -- 0.25 packs/day for 24 years    Types: Cigarettes    Quit date: 04/15/2005  . Smokeless tobacco: Never Used  . Alcohol Use: Yes     Comment: 06/01/2012 "~  once/month I have 2-3 mixed drinks"  . Drug Use: No  . Sexual Activity: Yes    Partners: Female    Birth Control/ Protection: Condom     Comment: one partner   Other Topics Concern  . 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 Objective:  Physical Exam: Filed Vitals:   04/11/13 0819  BP: 121/77  Pulse: 98  Temp: 97.6 F (36.4 C)  TempSrc: Oral  SpO2: 100%   Physical Exam  Constitutional: He is oriented to person, place, and time. He appears well-developed and well-nourished. No distress.  HENT:  Head: Normocephalic.  Mouth/Throat: Oropharynx is clear and moist. No oropharyngeal exudate.  Eyes: EOM are normal. Pupils are equal, round, and reactive to light.  Neck: No JVD present.  Cardiovascular: Normal rate, regular rhythm, normal heart sounds and intact distal pulses.  Exam reveals no friction rub.   No murmur heard. Pulmonary/Chest: Effort normal and breath sounds normal. No respiratory distress. He has no wheezes. He has no rales.  Musculoskeletal: He exhibits no edema and no tenderness.  Neurological: He is alert and oriented to person, place, and time.  Skin: He is not diaphoretic.  Psychiatric: He has a normal mood and affect. His behavior is normal.    Assessment & Plan:

## 2013-04-11 NOTE — Assessment & Plan Note (Signed)
The patients BP is normal without ARB. Orthostatic vitals negative. Checking labs due to recent AKI.

## 2013-04-11 NOTE — Patient Instructions (Signed)
Please stop taking the Benicar.  Please start taking lasix 20 mg each morning.  Please contact MD for weight gain, SOB, cough, or lower leg swelling.  F/U with PCP in 4 weeks.

## 2013-04-13 NOTE — Progress Notes (Signed)
Case discussed with Dr. Komanski at time of visit.  We reviewed the resident's history and exam and pertinent patient test results.  I agree with the assessment, diagnosis, and plan of care documented in the resident's note. 

## 2013-05-04 ENCOUNTER — Encounter (HOSPITAL_BASED_OUTPATIENT_CLINIC_OR_DEPARTMENT_OTHER): Payer: No Typology Code available for payment source | Attending: General Surgery

## 2013-05-04 DIAGNOSIS — Y836 Removal of other organ (partial) (total) as the cause of abnormal reaction of the patient, or of later complication, without mention of misadventure at the time of the procedure: Secondary | ICD-10-CM | POA: Insufficient documentation

## 2013-05-04 DIAGNOSIS — E1169 Type 2 diabetes mellitus with other specified complication: Secondary | ICD-10-CM | POA: Insufficient documentation

## 2013-05-04 DIAGNOSIS — L97509 Non-pressure chronic ulcer of other part of unspecified foot with unspecified severity: Secondary | ICD-10-CM | POA: Insufficient documentation

## 2013-05-04 DIAGNOSIS — T8789 Other complications of amputation stump: Secondary | ICD-10-CM | POA: Insufficient documentation

## 2013-05-07 ENCOUNTER — Encounter (HOSPITAL_COMMUNITY): Payer: Self-pay | Admitting: Pharmacy Technician

## 2013-05-07 ENCOUNTER — Other Ambulatory Visit (HOSPITAL_COMMUNITY): Payer: Self-pay | Admitting: Orthopedic Surgery

## 2013-05-10 ENCOUNTER — Inpatient Hospital Stay (HOSPITAL_COMMUNITY)
Admission: EM | Admit: 2013-05-10 | Discharge: 2013-05-21 | DRG: 853 | Disposition: A | Discharge status: Home Health | Payer: Medicaid Other | Attending: Internal Medicine | Admitting: Internal Medicine

## 2013-05-10 ENCOUNTER — Other Ambulatory Visit: Payer: Self-pay

## 2013-05-10 ENCOUNTER — Encounter (HOSPITAL_COMMUNITY): Payer: Self-pay | Admitting: Emergency Medicine

## 2013-05-10 ENCOUNTER — Emergency Department (HOSPITAL_COMMUNITY): Payer: Medicaid Other

## 2013-05-10 ENCOUNTER — Observation Stay (HOSPITAL_COMMUNITY): Payer: Medicaid Other

## 2013-05-10 ENCOUNTER — Encounter (HOSPITAL_COMMUNITY)
Admission: RE | Admit: 2013-05-10 | Discharge: 2013-05-10 | Disposition: A | Discharge status: Home / Self Care | Payer: No Typology Code available for payment source | Source: Ambulatory Visit | Attending: Orthopedic Surgery | Admitting: Orthopedic Surgery

## 2013-05-10 DIAGNOSIS — E861 Hypovolemia: Secondary | ICD-10-CM | POA: Diagnosis present

## 2013-05-10 DIAGNOSIS — L02619 Cutaneous abscess of unspecified foot: Secondary | ICD-10-CM | POA: Diagnosis present

## 2013-05-10 DIAGNOSIS — B964 Proteus (mirabilis) (morganii) as the cause of diseases classified elsewhere: Secondary | ICD-10-CM | POA: Diagnosis present

## 2013-05-10 DIAGNOSIS — Z9861 Coronary angioplasty status: Secondary | ICD-10-CM

## 2013-05-10 DIAGNOSIS — I70229 Atherosclerosis of native arteries of extremities with rest pain, unspecified extremity: Secondary | ICD-10-CM

## 2013-05-10 DIAGNOSIS — M869 Osteomyelitis, unspecified: Secondary | ICD-10-CM

## 2013-05-10 DIAGNOSIS — N179 Acute kidney failure, unspecified: Secondary | ICD-10-CM | POA: Diagnosis present

## 2013-05-10 DIAGNOSIS — G609 Hereditary and idiopathic neuropathy, unspecified: Secondary | ICD-10-CM | POA: Diagnosis present

## 2013-05-10 DIAGNOSIS — B952 Enterococcus as the cause of diseases classified elsewhere: Secondary | ICD-10-CM | POA: Diagnosis present

## 2013-05-10 DIAGNOSIS — E785 Hyperlipidemia, unspecified: Secondary | ICD-10-CM

## 2013-05-10 DIAGNOSIS — E11621 Type 2 diabetes mellitus with foot ulcer: Secondary | ICD-10-CM

## 2013-05-10 DIAGNOSIS — E1169 Type 2 diabetes mellitus with other specified complication: Secondary | ICD-10-CM

## 2013-05-10 DIAGNOSIS — E1142 Type 2 diabetes mellitus with diabetic polyneuropathy: Secondary | ICD-10-CM | POA: Diagnosis present

## 2013-05-10 DIAGNOSIS — E118 Type 2 diabetes mellitus with unspecified complications: Secondary | ICD-10-CM

## 2013-05-10 DIAGNOSIS — S98139A Complete traumatic amputation of one unspecified lesser toe, initial encounter: Secondary | ICD-10-CM

## 2013-05-10 DIAGNOSIS — E1165 Type 2 diabetes mellitus with hyperglycemia: Secondary | ICD-10-CM

## 2013-05-10 DIAGNOSIS — E1149 Type 2 diabetes mellitus with other diabetic neurological complication: Secondary | ICD-10-CM | POA: Diagnosis present

## 2013-05-10 DIAGNOSIS — Z7982 Long term (current) use of aspirin: Secondary | ICD-10-CM

## 2013-05-10 DIAGNOSIS — I5033 Acute on chronic diastolic (congestive) heart failure: Secondary | ICD-10-CM | POA: Diagnosis present

## 2013-05-10 DIAGNOSIS — D539 Nutritional anemia, unspecified: Secondary | ICD-10-CM

## 2013-05-10 DIAGNOSIS — A419 Sepsis, unspecified organism: Secondary | ICD-10-CM

## 2013-05-10 DIAGNOSIS — I739 Peripheral vascular disease, unspecified: Secondary | ICD-10-CM

## 2013-05-10 DIAGNOSIS — Z79899 Other long term (current) drug therapy: Secondary | ICD-10-CM

## 2013-05-10 DIAGNOSIS — M9080 Osteopathy in diseases classified elsewhere, unspecified site: Secondary | ICD-10-CM | POA: Diagnosis present

## 2013-05-10 DIAGNOSIS — A4902 Methicillin resistant Staphylococcus aureus infection, unspecified site: Secondary | ICD-10-CM | POA: Diagnosis present

## 2013-05-10 DIAGNOSIS — L97519 Non-pressure chronic ulcer of other part of right foot with unspecified severity: Secondary | ICD-10-CM

## 2013-05-10 DIAGNOSIS — L97509 Non-pressure chronic ulcer of other part of unspecified foot with unspecified severity: Secondary | ICD-10-CM | POA: Diagnosis present

## 2013-05-10 DIAGNOSIS — S98919A Complete traumatic amputation of unspecified foot, level unspecified, initial encounter: Secondary | ICD-10-CM

## 2013-05-10 DIAGNOSIS — J9600 Acute respiratory failure, unspecified whether with hypoxia or hypercapnia: Secondary | ICD-10-CM | POA: Diagnosis present

## 2013-05-10 DIAGNOSIS — X58XXXA Exposure to other specified factors, initial encounter: Secondary | ICD-10-CM

## 2013-05-10 DIAGNOSIS — I2510 Atherosclerotic heart disease of native coronary artery without angina pectoris: Secondary | ICD-10-CM | POA: Diagnosis present

## 2013-05-10 DIAGNOSIS — K219 Gastro-esophageal reflux disease without esophagitis: Secondary | ICD-10-CM | POA: Diagnosis present

## 2013-05-10 DIAGNOSIS — Z794 Long term (current) use of insulin: Secondary | ICD-10-CM

## 2013-05-10 DIAGNOSIS — E871 Hypo-osmolality and hyponatremia: Secondary | ICD-10-CM

## 2013-05-10 DIAGNOSIS — E873 Alkalosis: Secondary | ICD-10-CM

## 2013-05-10 DIAGNOSIS — Z8249 Family history of ischemic heart disease and other diseases of the circulatory system: Secondary | ICD-10-CM

## 2013-05-10 DIAGNOSIS — E874 Mixed disorder of acid-base balance: Secondary | ICD-10-CM | POA: Diagnosis present

## 2013-05-10 DIAGNOSIS — R739 Hyperglycemia, unspecified: Secondary | ICD-10-CM

## 2013-05-10 DIAGNOSIS — IMO0002 Reserved for concepts with insufficient information to code with codable children: Secondary | ICD-10-CM

## 2013-05-10 DIAGNOSIS — R1115 Cyclical vomiting syndrome unrelated to migraine: Secondary | ICD-10-CM

## 2013-05-10 DIAGNOSIS — E119 Type 2 diabetes mellitus without complications: Secondary | ICD-10-CM | POA: Diagnosis present

## 2013-05-10 DIAGNOSIS — I509 Heart failure, unspecified: Secondary | ICD-10-CM

## 2013-05-10 DIAGNOSIS — I10 Essential (primary) hypertension: Secondary | ICD-10-CM

## 2013-05-10 DIAGNOSIS — D62 Acute posthemorrhagic anemia: Secondary | ICD-10-CM | POA: Diagnosis not present

## 2013-05-10 DIAGNOSIS — I998 Other disorder of circulatory system: Secondary | ICD-10-CM | POA: Diagnosis present

## 2013-05-10 DIAGNOSIS — Z833 Family history of diabetes mellitus: Secondary | ICD-10-CM

## 2013-05-10 DIAGNOSIS — Z87891 Personal history of nicotine dependence: Secondary | ICD-10-CM

## 2013-05-10 DIAGNOSIS — I96 Gangrene, not elsewhere classified: Secondary | ICD-10-CM | POA: Diagnosis present

## 2013-05-10 DIAGNOSIS — L03119 Cellulitis of unspecified part of limb: Secondary | ICD-10-CM

## 2013-05-10 DIAGNOSIS — IMO0001 Reserved for inherently not codable concepts without codable children: Secondary | ICD-10-CM | POA: Diagnosis not present

## 2013-05-10 DIAGNOSIS — R064 Hyperventilation: Secondary | ICD-10-CM | POA: Diagnosis present

## 2013-05-10 DIAGNOSIS — R6520 Severe sepsis without septic shock: Secondary | ICD-10-CM

## 2013-05-10 LAB — HEPATIC FUNCTION PANEL
ALBUMIN: 2.3 g/dL — AB (ref 3.5–5.2)
ALT: 46 U/L (ref 0–53)
AST: 44 U/L — AB (ref 0–37)
Alkaline Phosphatase: 199 U/L — ABNORMAL HIGH (ref 39–117)
BILIRUBIN INDIRECT: 0.5 mg/dL (ref 0.3–0.9)
Bilirubin, Direct: 0.6 mg/dL — ABNORMAL HIGH (ref 0.0–0.3)
Total Bilirubin: 1.1 mg/dL (ref 0.3–1.2)
Total Protein: 7.3 g/dL (ref 6.0–8.3)

## 2013-05-10 LAB — CBC WITH DIFFERENTIAL/PLATELET
BASOS ABS: 0 10*3/uL (ref 0.0–0.1)
Basophils Relative: 0 % (ref 0–1)
Eosinophils Absolute: 0 10*3/uL (ref 0.0–0.7)
Eosinophils Relative: 0 % (ref 0–5)
HCT: 28.1 % — ABNORMAL LOW (ref 39.0–52.0)
Hemoglobin: 9.6 g/dL — ABNORMAL LOW (ref 13.0–17.0)
LYMPHS PCT: 3 % — AB (ref 12–46)
Lymphs Abs: 0.5 10*3/uL — ABNORMAL LOW (ref 0.7–4.0)
MCH: 27.9 pg (ref 26.0–34.0)
MCHC: 34.2 g/dL (ref 30.0–36.0)
MCV: 81.7 fL (ref 78.0–100.0)
Monocytes Absolute: 0.7 10*3/uL (ref 0.1–1.0)
Monocytes Relative: 4 % (ref 3–12)
NEUTROS ABS: 17.8 10*3/uL — AB (ref 1.7–7.7)
NEUTROS PCT: 93 % — AB (ref 43–77)
PLATELETS: 321 10*3/uL (ref 150–400)
RBC: 3.44 MIL/uL — ABNORMAL LOW (ref 4.22–5.81)
RDW: 13.6 % (ref 11.5–15.5)
WBC: 19.1 10*3/uL — AB (ref 4.0–10.5)

## 2013-05-10 LAB — URINALYSIS, ROUTINE W REFLEX MICROSCOPIC
Bilirubin Urine: NEGATIVE
Ketones, ur: 15 mg/dL — AB
LEUKOCYTES UA: NEGATIVE
Nitrite: NEGATIVE
PH: 5.5 (ref 5.0–8.0)
Protein, ur: >300 mg/dL — AB
SPECIFIC GRAVITY, URINE: 1.019 (ref 1.005–1.030)
Urobilinogen, UA: 0.2 mg/dL (ref 0.0–1.0)

## 2013-05-10 LAB — I-STAT ARTERIAL BLOOD GAS, ED
Acid-base deficit: 1 mmol/L (ref 0.0–2.0)
Bicarbonate: 21.3 mEq/L (ref 20.0–24.0)
O2 Saturation: 100 %
TCO2: 22 mmol/L (ref 0–100)
pCO2 arterial: 28.1 mmHg — ABNORMAL LOW (ref 35.0–45.0)
pH, Arterial: 7.488 — ABNORMAL HIGH (ref 7.350–7.450)
pO2, Arterial: 405 mmHg — ABNORMAL HIGH (ref 80.0–100.0)

## 2013-05-10 LAB — COMPREHENSIVE METABOLIC PANEL
ALK PHOS: 214 U/L — AB (ref 39–117)
ALT: 48 U/L (ref 0–53)
AST: 52 U/L — ABNORMAL HIGH (ref 0–37)
Albumin: 2.5 g/dL — ABNORMAL LOW (ref 3.5–5.2)
BILIRUBIN TOTAL: 1.3 mg/dL — AB (ref 0.3–1.2)
BUN: 17 mg/dL (ref 6–23)
CHLORIDE: 81 meq/L — AB (ref 96–112)
CO2: 23 mEq/L (ref 19–32)
Calcium: 8.9 mg/dL (ref 8.4–10.5)
Creatinine, Ser: 1.34 mg/dL (ref 0.50–1.35)
GFR, EST AFRICAN AMERICAN: 68 mL/min — AB (ref >90)
GFR, EST NON AFRICAN AMERICAN: 59 mL/min — AB (ref >90)
Glucose, Bld: 378 mg/dL — ABNORMAL HIGH (ref 70–99)
POTASSIUM: 4.8 meq/L (ref 3.7–5.3)
Sodium: 122 mEq/L — ABNORMAL LOW (ref 137–147)
Total Protein: 7.7 g/dL (ref 6.0–8.3)

## 2013-05-10 LAB — URINE MICROSCOPIC-ADD ON

## 2013-05-10 LAB — I-STAT TROPONIN, ED: Troponin i, poc: 0.01 ng/mL (ref 0.00–0.08)

## 2013-05-10 LAB — LIPASE, BLOOD: Lipase: 11 U/L (ref 11–59)

## 2013-05-10 MED ORDER — FAMOTIDINE IN NACL 20-0.9 MG/50ML-% IV SOLN
20.0000 mg | Freq: Once | INTRAVENOUS | Status: AC
  Administered 2013-05-10: 20 mg via INTRAVENOUS
  Filled 2013-05-10: qty 50

## 2013-05-10 MED ORDER — SODIUM CHLORIDE 0.9 % IV BOLUS (SEPSIS)
1000.0000 mL | Freq: Once | INTRAVENOUS | Status: AC
  Administered 2013-05-10: 1000 mL via INTRAVENOUS

## 2013-05-10 MED ORDER — ONDANSETRON 4 MG PO TBDP
4.0000 mg | ORAL_TABLET | Freq: Once | ORAL | Status: AC
  Administered 2013-05-10: 4 mg via ORAL
  Filled 2013-05-10: qty 1

## 2013-05-10 MED ORDER — PIPERACILLIN-TAZOBACTAM 3.375 G IVPB 30 MIN
3.3750 g | Freq: Once | INTRAVENOUS | Status: AC
  Administered 2013-05-11: 3.375 g via INTRAVENOUS
  Filled 2013-05-10: qty 50

## 2013-05-10 MED ORDER — DICYCLOMINE HCL 10 MG/ML IM SOLN
20.0000 mg | Freq: Once | INTRAMUSCULAR | Status: AC
  Administered 2013-05-10: 20 mg via INTRAMUSCULAR
  Filled 2013-05-10: qty 2

## 2013-05-10 MED ORDER — METOCLOPRAMIDE HCL 5 MG/ML IJ SOLN
10.0000 mg | Freq: Once | INTRAMUSCULAR | Status: AC
  Administered 2013-05-10: 10 mg via INTRAVENOUS
  Filled 2013-05-10: qty 2

## 2013-05-10 MED ORDER — ALBUTEROL SULFATE (2.5 MG/3ML) 0.083% IN NEBU
INHALATION_SOLUTION | RESPIRATORY_TRACT | Status: AC
  Administered 2013-05-10: 2.5 mg
  Filled 2013-05-10: qty 3

## 2013-05-10 MED ORDER — VANCOMYCIN HCL IN DEXTROSE 1-5 GM/200ML-% IV SOLN
1000.0000 mg | Freq: Two times a day (BID) | INTRAVENOUS | Status: DC
  Administered 2013-05-11 – 2013-05-12 (×5): 1000 mg via INTRAVENOUS
  Filled 2013-05-10 (×5): qty 200

## 2013-05-10 MED ORDER — NITROGLYCERIN 0.4 MG SL SUBL
0.4000 mg | SUBLINGUAL_TABLET | Freq: Once | SUBLINGUAL | Status: DC
Start: 2013-05-10 — End: 2013-05-10

## 2013-05-10 MED ORDER — LORAZEPAM 2 MG/ML IJ SOLN
1.0000 mg | Freq: Once | INTRAMUSCULAR | Status: AC
  Administered 2013-05-10: 1 mg via INTRAVENOUS
  Filled 2013-05-10: qty 1

## 2013-05-10 MED ORDER — PIPERACILLIN-TAZOBACTAM 3.375 G IVPB
3.3750 g | Freq: Three times a day (TID) | INTRAVENOUS | Status: DC
  Administered 2013-05-11 – 2013-05-12 (×4): 3.375 g via INTRAVENOUS
  Filled 2013-05-10 (×7): qty 50

## 2013-05-10 MED ORDER — IPRATROPIUM BROMIDE 0.02 % IN SOLN
RESPIRATORY_TRACT | Status: AC
  Administered 2013-05-10: 0.5 mg
  Filled 2013-05-10: qty 2.5

## 2013-05-10 MED ORDER — FUROSEMIDE 10 MG/ML IJ SOLN
60.0000 mg | Freq: Once | INTRAMUSCULAR | Status: AC
  Administered 2013-05-10: 60 mg via INTRAVENOUS
  Filled 2013-05-10: qty 6

## 2013-05-10 NOTE — ED Notes (Signed)
CMP results discussed with EDP.

## 2013-05-10 NOTE — Progress Notes (Signed)
Pt C/O the inability to eat, N/V, lightheadedness and a runny nose for the past 7 days. Pt VS: BP 110/70 pulse 115, Temp 98.2, 02 sat 100% RA. Spoke with Ebony Hail, [place]  (anesthesia) regarding S/S and she suggested that pt be evaluated in ED  and surgeon be made aware. Will follow up.

## 2013-05-10 NOTE — ED Notes (Addendum)
Hoffman, DO made aware of the pt's progress, and agrees with the orders placed my Pollina,MD. Hoffman, DO would like an ABG on the pt.

## 2013-05-10 NOTE — Progress Notes (Signed)
Spoke with Tiffany ( triage nurse at Dr. Jess Barters office) to make MD aware of pt S/S, VS and pt currently being evaluated in ED.

## 2013-05-10 NOTE — ED Notes (Signed)
Pt denies any new acute pain, only c/o chronic pain at this time
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
## 2013-05-10 NOTE — Progress Notes (Signed)
Attempted to get report but RN assigned was not ready yet and said will call back again.  Evern Core, RN
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
## 2013-05-10 NOTE — ED Notes (Signed)
Patient is at xray unable to collect urine sample

## 2013-05-10 NOTE — ED Notes (Signed)
Pt reports N/V x 4 days with generalized weakness. Denies F/C/diarrhea. Denies any pain. States he has surgery tomorrow for right great toe removal dt diabetic ulcer. Denies nausea this time.

## 2013-05-10 NOTE — Progress Notes (Signed)
ANTIBIOTIC CONSULT NOTE - INITIAL  Pharmacy Consult for Vancocin and Zosyn Indication: rule out sepsis  No Known Allergies  Patient Measurements: Height: 6' (182.9 cm) Weight: 190 lb (86.183 kg) IBW/kg (Calculated) : 77.6  Vital Signs: Temp: 99.4 F (37.4 C) (05/07 2008) Temp src: Oral (05/07 2008) BP: 142/56 mmHg (05/07 2145) Pulse Rate: 132 (05/07 2145)  Labs:  Recent Labs  05/10/13 1322  WBC 19.1*  HGB 9.6*  PLT 321  CREATININE 1.34   Estimated Creatinine Clearance: 70 ml/min (by C-G formula based on Cr of 1.34).  Microbiology: No results found for this or any previous visit (from the past 720 hour(s)).   Medical History: Past Medical History  Diagnosis Date  . Hyperlipidemia   . Hypertension   . Osteomyelitis 2010    left foot, s/p midfoot amputation  . 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 Neuromuscular disorder     diabetic neruopathy  . PAD (peripheral artery disease)     ABIs 11/30/11: L ABI 0.68, R ABI 0.84  . Pneumonia 2010  . Critical lower limb ischemia, lt with ABI of 0.60 12/31/2011  . PVD (peripheral vascular disease) 12/31/2011  . S/P angioplasty with stent, 12/30/11, of Lt SFA, Post. tibialis and PTA of L. ant and post. tibial vessels 12/31/2011  . Type II diabetes mellitus ~ 2002  . GERD (gastroesophageal reflux disease)   . CHF (congestive heart failure)     Assessment: 53yo male was at pre-admission testing for same-day surgery (toe amputation 2/2 diabetic ulcer) and c/o anorexia, N/V, light-headedness, and rhinorrhea x7d, anesthesia PA recommended sending pt to ED for evaulation, admitted for concern for sepsis, to begin IV ABX.  Goal of Therapy:  Vancomycin trough level 15-20 mcg/ml  Plan:  Will begin vancomycin 1000mg  IV Q12H (was previously slightly subtherapeutic at this dose though SCr then was 0.86, now 1.34) and Zosyn 3.375g IV Q8H and monitor CBC, Cx, levels prn.  Wynona Neat, PharmD,  BCPS  05/10/2013,11:14 PM

## 2013-05-10 NOTE — ED Provider Notes (Addendum)
Medical screening examination/treatment/procedure(s) were conducted as a shared visit with non-physician practitioner(s) and myself.  I personally evaluated the patient during the encounter.   EKG  Date: 05/10/2013  Rate: 116  Rhythm: sinus tachycardia  QRS Axis: normal  Intervals: normal  ST/T Wave abnormalities: early repolarization  Conduction Disutrbances:none  Narrative Interpretation:   Old EKG Reviewed: unchanged   Presents to the ER for evaluation of feeling weak, dizziness. Patient having difficulty with ambulation because of the weakness. Upon arrival he did appear to. Patient has significant infection to the right great toe secondary to diabetes. This is likely playing a role. She appears mildly dehydrated. His electrolyte disturbances including hyponatremia. Patient will require hospitalization for further management.   Addendum 20:55 the patient room for difficulty breathing. Patient sitting up, tripoding, oxygen saturation 80%. Lung auscultation reveals diffuse rales, suspect volume overload. Patient reports that he did not take his Lasix today, has been given fluids here in the ER. Patient given a nebulizer treatment with some improvement, oxygen saturation of 87%. Will initiate BiPAP, admitting service has been notified of the change in condition.  Orpah Greek, MD 05/10/13 1925  Orpah Greek, MD 05/10/13 1948  Orpah Greek, MD 05/10/13 [phone]

## 2013-05-10 NOTE — ED Notes (Signed)
Pt continues to desat despite application of 02. O2 increased to 4L.

## 2013-05-10 NOTE — ED Notes (Signed)
Pt's O2 sat continues to drop despite O2. Pollina, MD made aware and a breathing treatment was ordered. Respiratory called to the bedside.

## 2013-05-10 NOTE — ED Notes (Addendum)
This RN was dressing the pt's toe, and found that he was tachycardic and his oxygen saturation dropped to 88%. Pt placed on 2L O2, and a liter fluid bolus started. Admitting physicians made aware and bed placement change to be made. Marygrace Drought, RN on 6E made aware.

## 2013-05-11 ENCOUNTER — Inpatient Hospital Stay (HOSPITAL_COMMUNITY): Payer: Medicaid Other

## 2013-05-11 ENCOUNTER — Encounter (HOSPITAL_COMMUNITY): Payer: Medicaid Other | Admitting: Vascular Surgery

## 2013-05-11 ENCOUNTER — Encounter (HOSPITAL_COMMUNITY): Payer: Self-pay | Admitting: Anesthesiology

## 2013-05-11 ENCOUNTER — Ambulatory Visit (HOSPITAL_COMMUNITY)
Admission: RE | Admit: 2013-05-11 | Payer: No Typology Code available for payment source | Source: Ambulatory Visit | Admitting: Orthopedic Surgery

## 2013-05-11 ENCOUNTER — Encounter (HOSPITAL_COMMUNITY)
Admission: EM | Disposition: A | Discharge status: Home Health | Payer: Self-pay | Source: Home / Self Care | Attending: Internal Medicine

## 2013-05-11 ENCOUNTER — Inpatient Hospital Stay (HOSPITAL_COMMUNITY): Payer: Medicaid Other | Admitting: Anesthesiology

## 2013-05-11 DIAGNOSIS — M869 Osteomyelitis, unspecified: Secondary | ICD-10-CM | POA: Diagnosis present

## 2013-05-11 DIAGNOSIS — J9600 Acute respiratory failure, unspecified whether with hypoxia or hypercapnia: Secondary | ICD-10-CM | POA: Diagnosis present

## 2013-05-11 DIAGNOSIS — E872 Acidosis, unspecified: Secondary | ICD-10-CM

## 2013-05-11 DIAGNOSIS — R739 Hyperglycemia, unspecified: Secondary | ICD-10-CM | POA: Diagnosis present

## 2013-05-11 DIAGNOSIS — J811 Chronic pulmonary edema: Secondary | ICD-10-CM

## 2013-05-11 DIAGNOSIS — A419 Sepsis, unspecified organism: Secondary | ICD-10-CM | POA: Diagnosis present

## 2013-05-11 DIAGNOSIS — E871 Hypo-osmolality and hyponatremia: Secondary | ICD-10-CM

## 2013-05-11 HISTORY — PX: AMPUTATION: SHX166

## 2013-05-11 LAB — BASIC METABOLIC PANEL
BUN: 20 mg/dL (ref 6–23)
BUN: 17 mg/dL (ref 6–23)
BUN: 18 mg/dL (ref 6–23)
BUN: 18 mg/dL (ref 6–23)
CALCIUM: 8 mg/dL — AB (ref 8.4–10.5)
CALCIUM: 8.1 mg/dL — AB (ref 8.4–10.5)
CHLORIDE: 84 meq/L — AB (ref 96–112)
CHLORIDE: 91 meq/L — AB (ref 96–112)
CHLORIDE: 91 meq/L — AB (ref 96–112)
CO2: 21 meq/L (ref 19–32)
CO2: 23 mEq/L (ref 19–32)
CO2: 20 meq/L (ref 19–32)
CO2: 22 mEq/L (ref 19–32)
Calcium: 8.3 mg/dL — ABNORMAL LOW (ref 8.4–10.5)
Calcium: 7.9 mg/dL — ABNORMAL LOW (ref 8.4–10.5)
Chloride: 90 mEq/L — ABNORMAL LOW (ref 96–112)
Creatinine, Ser: 1.25 mg/dL (ref 0.50–1.35)
Creatinine, Ser: 1.16 mg/dL (ref 0.50–1.35)
Creatinine, Ser: 1.04 mg/dL (ref 0.50–1.35)
Creatinine, Ser: 0.98 mg/dL (ref 0.50–1.35)
GFR calc Af Amer: 74 mL/min — ABNORMAL LOW (ref >90)
GFR calc Af Amer: >90 mL/min (ref >90)
GFR calc Af Amer: >90 mL/min (ref >90)
GFR calc non Af Amer: 64 mL/min — ABNORMAL LOW (ref >90)
GFR calc non Af Amer: 70 mL/min — ABNORMAL LOW (ref >90)
GFR calc non Af Amer: 80 mL/min — ABNORMAL LOW (ref >90)
GFR, EST AFRICAN AMERICAN: 81 mL/min — AB (ref >90)
GLUCOSE: 380 mg/dL — AB (ref 70–99)
GLUCOSE: 360 mg/dL — AB (ref 70–99)
GLUCOSE: 382 mg/dL — AB (ref 70–99)
Glucose, Bld: 275 mg/dL — ABNORMAL HIGH (ref 70–99)
POTASSIUM: 4 meq/L (ref 3.7–5.3)
POTASSIUM: 4.4 meq/L (ref 3.7–5.3)
POTASSIUM: 4.6 meq/L (ref 3.7–5.3)
Potassium: 4.8 mEq/L (ref 3.7–5.3)
SODIUM: 124 meq/L — AB (ref 137–147)
SODIUM: 128 meq/L — AB (ref 137–147)
SODIUM: 127 meq/L — AB (ref 137–147)
Sodium: 128 mEq/L — ABNORMAL LOW (ref 137–147)

## 2013-05-11 LAB — UREA NITROGEN, URINE: Urea Nitrogen, Ur: 264 mg/dL

## 2013-05-11 LAB — GLUCOSE, CAPILLARY
GLUCOSE-CAPILLARY: 384 mg/dL — AB (ref 70–99)
GLUCOSE-CAPILLARY: 253 mg/dL — AB (ref 70–99)
GLUCOSE-CAPILLARY: 410 mg/dL — AB (ref 70–99)
Glucose-Capillary: 405 mg/dL — ABNORMAL HIGH (ref 70–99)
Glucose-Capillary: 261 mg/dL — ABNORMAL HIGH (ref 70–99)
Glucose-Capillary: 263 mg/dL — ABNORMAL HIGH (ref 70–99)
Glucose-Capillary: 286 mg/dL — ABNORMAL HIGH (ref 70–99)

## 2013-05-11 LAB — CBG MONITORING, ED: GLUCOSE-CAPILLARY: 427 mg/dL — AB (ref 70–99)

## 2013-05-11 LAB — CBC
HCT: 24.9 % — ABNORMAL LOW (ref 39.0–52.0)
Hemoglobin: 8.2 g/dL — ABNORMAL LOW (ref 13.0–17.0)
MCH: 27.2 pg (ref 26.0–34.0)
MCHC: 32.9 g/dL (ref 30.0–36.0)
MCV: 82.5 fL (ref 78.0–100.0)
PLATELETS: 284 10*3/uL (ref 150–400)
RBC: 3.02 MIL/uL — AB (ref 4.22–5.81)
RDW: 13.9 % (ref 11.5–15.5)
WBC: 17.2 10*3/uL — ABNORMAL HIGH (ref 4.0–10.5)

## 2013-05-11 LAB — URINE CULTURE
Colony Count: NO GROWTH
Culture: NO GROWTH

## 2013-05-11 LAB — CREATININE, URINE, RANDOM: Creatinine, Urine: 34.87 mg/dL

## 2013-05-11 LAB — LACTIC ACID, PLASMA: Lactic Acid, Venous: 2.2 mmol/L (ref 0.5–2.2)

## 2013-05-11 LAB — PRO B NATRIURETIC PEPTIDE: Pro B Natriuretic peptide (BNP): 2639 pg/mL — ABNORMAL HIGH (ref 0–125)

## 2013-05-11 LAB — TSH: TSH: 0.869 u[IU]/mL (ref 0.350–4.500)

## 2013-05-11 LAB — MRSA PCR SCREENING: MRSA by PCR: NEGATIVE

## 2013-05-11 LAB — OSMOLALITY, URINE: OSMOLALITY UR: 210 mosm/kg — AB (ref 390–1090)

## 2013-05-11 SURGERY — AMPUTATION, FOOT, RAY
Anesthesia: General | Site: Toe | Laterality: Right

## 2013-05-11 MED ORDER — GADOBENATE DIMEGLUMINE 529 MG/ML IV SOLN
20.0000 mL | Freq: Once | INTRAVENOUS | Status: AC
  Administered 2013-05-11: 18 mL via INTRAVENOUS

## 2013-05-11 MED ORDER — INSULIN ASPART 100 UNIT/ML ~~LOC~~ SOLN
14.0000 [IU] | Freq: Once | SUBCUTANEOUS | Status: AC
  Administered 2013-05-11: 14 [IU] via SUBCUTANEOUS

## 2013-05-11 MED ORDER — SODIUM CHLORIDE 0.9 % IV SOLN
INTRAVENOUS | Status: DC | PRN
  Administered 2013-05-11: 12:00:00 via INTRAVENOUS

## 2013-05-11 MED ORDER — HYDROMORPHONE HCL PF 1 MG/ML IJ SOLN
0.2500 mg | INTRAMUSCULAR | Status: DC | PRN
  Administered 2013-05-11 (×2): 0.5 mg via INTRAVENOUS

## 2013-05-11 MED ORDER — PROPOFOL 10 MG/ML IV BOLUS
INTRAVENOUS | Status: DC | PRN
  Administered 2013-05-11: 200 mg via INTRAVENOUS

## 2013-05-11 MED ORDER — MIDAZOLAM HCL 2 MG/2ML IJ SOLN
INTRAMUSCULAR | Status: AC
  Filled 2013-05-11: qty 2

## 2013-05-11 MED ORDER — ASPIRIN EC 325 MG PO TBEC
325.0000 mg | DELAYED_RELEASE_TABLET | Freq: Every day | ORAL | Status: DC
  Administered 2013-05-12 – 2013-05-21 (×10): 325 mg via ORAL
  Filled 2013-05-11 (×11): qty 1

## 2013-05-11 MED ORDER — OXYCODONE-ACETAMINOPHEN 5-325 MG PO TABS
1.0000 | ORAL_TABLET | ORAL | Status: DC | PRN
  Administered 2013-05-11 – 2013-05-14 (×8): 2 via ORAL
  Administered 2013-05-15: 1 via ORAL
  Administered 2013-05-15: 2 via ORAL
  Administered 2013-05-15: 1 via ORAL
  Administered 2013-05-16: 2 via ORAL
  Administered 2013-05-16: 1 via ORAL
  Administered 2013-05-17 (×2): 2 via ORAL
  Administered 2013-05-17: 1 via ORAL
  Administered 2013-05-18: 2 via ORAL
  Administered 2013-05-18: 1 via ORAL
  Administered 2013-05-18 – 2013-05-21 (×8): 2 via ORAL
  Filled 2013-05-11 (×2): qty 2
  Filled 2013-05-11: qty 1
  Filled 2013-05-11 (×16): qty 2
  Filled 2013-05-11: qty 1
  Filled 2013-05-11 (×3): qty 2
  Filled 2013-05-11: qty 1
  Filled 2013-05-11: qty 2
  Filled 2013-05-11: qty 1

## 2013-05-11 MED ORDER — PROPOFOL 10 MG/ML IV BOLUS
INTRAVENOUS | Status: AC
  Filled 2013-05-11: qty 20

## 2013-05-11 MED ORDER — TRAMADOL HCL 50 MG PO TABS: 50.0000 mg | ORAL_TABLET | Freq: Four times a day (QID) | ORAL | Status: DC | PRN

## 2013-05-11 MED ORDER — INSULIN GLARGINE 100 UNIT/ML ~~LOC~~ SOLN
35.0000 [IU] | Freq: Every day | SUBCUTANEOUS | Status: DC
  Administered 2013-05-11: 35 [IU] via SUBCUTANEOUS
  Filled 2013-05-11 (×2): qty 0.35

## 2013-05-11 MED ORDER — HYDROMORPHONE HCL PF 1 MG/ML IJ SOLN: 0.5000 mg | INTRAMUSCULAR | Status: DC | PRN

## 2013-05-11 MED ORDER — ATORVASTATIN CALCIUM 10 MG PO TABS
10.0000 mg | ORAL_TABLET | Freq: Every day | ORAL | Status: DC
  Administered 2013-05-11 – 2013-05-20 (×10): 10 mg via ORAL
  Filled 2013-05-11 (×11): qty 1

## 2013-05-11 MED ORDER — METOCLOPRAMIDE HCL 5 MG PO TABS
5.0000 mg | ORAL_TABLET | Freq: Three times a day (TID) | ORAL | Status: DC | PRN
  Filled 2013-05-11: qty 2

## 2013-05-11 MED ORDER — ACETAMINOPHEN 500 MG PO TABS: 500.0000 mg | ORAL_TABLET | Freq: Three times a day (TID) | ORAL | Status: DC | PRN

## 2013-05-11 MED ORDER — PHENYLEPHRINE HCL 10 MG/ML IJ SOLN
INTRAMUSCULAR | Status: DC | PRN
  Administered 2013-05-11 (×5): 80 ug via INTRAVENOUS

## 2013-05-11 MED ORDER — ONDANSETRON HCL 4 MG/2ML IJ SOLN
INTRAMUSCULAR | Status: DC | PRN
  Administered 2013-05-11: 4 mg via INTRAVENOUS

## 2013-05-11 MED ORDER — SODIUM CHLORIDE 0.9 % IV BOLUS (SEPSIS)
1000.0000 mL | Freq: Once | INTRAVENOUS | Status: AC
  Administered 2013-05-11: 1000 mL via INTRAVENOUS

## 2013-05-11 MED ORDER — FENTANYL CITRATE 0.05 MG/ML IJ SOLN
INTRAMUSCULAR | Status: AC
  Filled 2013-05-11: qty 5

## 2013-05-11 MED ORDER — FENTANYL CITRATE 0.05 MG/ML IJ SOLN
INTRAMUSCULAR | Status: DC | PRN
  Administered 2013-05-11: 50 ug via INTRAVENOUS

## 2013-05-11 MED ORDER — INSULIN ASPART 100 UNIT/ML ~~LOC~~ SOLN
15.0000 [IU] | Freq: Once | SUBCUTANEOUS | Status: AC
  Administered 2013-05-11: 15 [IU] via SUBCUTANEOUS

## 2013-05-11 MED ORDER — HEPARIN SODIUM (PORCINE) 5000 UNIT/ML IJ SOLN
5000.0000 [IU] | Freq: Three times a day (TID) | INTRAMUSCULAR | Status: DC
  Administered 2013-05-11 – 2013-05-17 (×17): 5000 [IU] via SUBCUTANEOUS
  Filled 2013-05-11 (×22): qty 1

## 2013-05-11 MED ORDER — LIDOCAINE HCL (CARDIAC) 20 MG/ML IV SOLN
INTRAVENOUS | Status: DC | PRN
  Administered 2013-05-11: 60 mg via INTRAVENOUS

## 2013-05-11 MED ORDER — METOCLOPRAMIDE HCL 5 MG/ML IJ SOLN
5.0000 mg | Freq: Three times a day (TID) | INTRAMUSCULAR | Status: DC | PRN
  Filled 2013-05-11: qty 2

## 2013-05-11 MED ORDER — MIDAZOLAM HCL 5 MG/5ML IJ SOLN
INTRAMUSCULAR | Status: DC | PRN
  Administered 2013-05-11: 2 mg via INTRAVENOUS

## 2013-05-11 MED ORDER — ALBUTEROL SULFATE (2.5 MG/3ML) 0.083% IN NEBU: 3.0000 mL | INHALATION_SOLUTION | Freq: Four times a day (QID) | RESPIRATORY_TRACT | Status: DC | PRN

## 2013-05-11 MED ORDER — INSULIN ASPART 100 UNIT/ML ~~LOC~~ SOLN
0.0000 [IU] | Freq: Three times a day (TID) | SUBCUTANEOUS | Status: DC
  Administered 2013-05-12 (×3): 20 [IU] via SUBCUTANEOUS
  Administered 2013-05-13: 15 [IU] via SUBCUTANEOUS
  Administered 2013-05-13 – 2013-05-14 (×3): 11 [IU] via SUBCUTANEOUS
  Administered 2013-05-14: 4 [IU] via SUBCUTANEOUS
  Administered 2013-05-14: 11 [IU] via SUBCUTANEOUS
  Administered 2013-05-15 (×3): 4 [IU] via SUBCUTANEOUS
  Administered 2013-05-16: 3 [IU] via SUBCUTANEOUS
  Administered 2013-05-16: 4 [IU] via SUBCUTANEOUS
  Administered 2013-05-17: 3 [IU] via SUBCUTANEOUS
  Administered 2013-05-17 (×2): 4 [IU] via SUBCUTANEOUS
  Administered 2013-05-18: 7 [IU] via SUBCUTANEOUS
  Administered 2013-05-18 (×2): 11 [IU] via SUBCUTANEOUS
  Administered 2013-05-19 (×2): 7 [IU] via SUBCUTANEOUS
  Administered 2013-05-20: 3 [IU] via SUBCUTANEOUS
  Administered 2013-05-20: 4 [IU] via SUBCUTANEOUS
  Administered 2013-05-20: 2 [IU] via SUBCUTANEOUS
  Administered 2013-05-21: 4 [IU] via SUBCUTANEOUS
  Administered 2013-05-21: 15 [IU] via SUBCUTANEOUS

## 2013-05-11 MED ORDER — SODIUM CHLORIDE 0.9 % IJ SOLN
3.0000 mL | Freq: Two times a day (BID) | INTRAMUSCULAR | Status: DC
  Administered 2013-05-11 – 2013-05-17 (×13): 3 mL via INTRAVENOUS

## 2013-05-11 MED ORDER — GABAPENTIN 300 MG PO CAPS
600.0000 mg | ORAL_CAPSULE | Freq: Three times a day (TID) | ORAL | Status: DC
  Administered 2013-05-11 – 2013-05-21 (×29): 600 mg via ORAL
  Filled 2013-05-11 (×34): qty 2

## 2013-05-11 MED ORDER — ONDANSETRON HCL 4 MG/2ML IJ SOLN: 4.0000 mg | Freq: Four times a day (QID) | INTRAMUSCULAR | Status: DC | PRN

## 2013-05-11 MED ORDER — 0.9 % SODIUM CHLORIDE (POUR BTL) OPTIME
TOPICAL | Status: DC | PRN
  Administered 2013-05-11: 1000 mL

## 2013-05-11 MED ORDER — INSULIN ASPART 100 UNIT/ML ~~LOC~~ SOLN
0.0000 [IU] | Freq: Three times a day (TID) | SUBCUTANEOUS | Status: DC
  Administered 2013-05-11 (×2): 8 [IU] via SUBCUTANEOUS

## 2013-05-11 MED ORDER — ONDANSETRON HCL 4 MG PO TABS: 4.0000 mg | ORAL_TABLET | Freq: Four times a day (QID) | ORAL | Status: DC | PRN

## 2013-05-11 MED ORDER — ARTIFICIAL TEARS OP OINT
TOPICAL_OINTMENT | OPHTHALMIC | Status: DC | PRN
  Administered 2013-05-11: 1 via OPHTHALMIC

## 2013-05-11 MED ORDER — INSULIN ASPART 100 UNIT/ML ~~LOC~~ SOLN
0.0000 [IU] | Freq: Every day | SUBCUTANEOUS | Status: DC
  Administered 2013-05-12: 4 [IU] via SUBCUTANEOUS
  Administered 2013-05-13: 5 [IU] via SUBCUTANEOUS
  Administered 2013-05-14 – 2013-05-20 (×3): 2 [IU] via SUBCUTANEOUS

## 2013-05-11 SURGICAL SUPPLY — 36 items
BANDAGE GAUZE ELAST BULKY 4 IN (GAUZE/BANDAGES/DRESSINGS) ×3 IMPLANT
BLADE SAW SGTL MED 73X18.5 STR (BLADE) IMPLANT
BNDG COHESIVE 4X5 TAN STRL (GAUZE/BANDAGES/DRESSINGS) ×3 IMPLANT
COVER SURGICAL LIGHT HANDLE (MISCELLANEOUS) ×3 IMPLANT
DRAPE U-SHAPE 47X51 STRL (DRAPES) ×6 IMPLANT
DRSG ADAPTIC 3X8 NADH LF (GAUZE/BANDAGES/DRESSINGS) ×3 IMPLANT
DRSG EMULSION OIL 3X3 NADH (GAUZE/BANDAGES/DRESSINGS) ×3 IMPLANT
DRSG PAD ABDOMINAL 8X10 ST (GAUZE/BANDAGES/DRESSINGS) ×6 IMPLANT
DURAPREP 26ML APPLICATOR (WOUND CARE) ×3 IMPLANT
ELECT REM PT RETURN 9FT ADLT (ELECTROSURGICAL) ×3
ELECTRODE REM PT RTRN 9FT ADLT (ELECTROSURGICAL) ×2 IMPLANT
GLOVE BIO SURGEON STRL SZ 6 (GLOVE) ×3 IMPLANT
GLOVE BIO SURGEON STRL SZ7 (GLOVE) ×3 IMPLANT
GLOVE BIOGEL PI IND STRL 6.5 (GLOVE) ×2 IMPLANT
GLOVE BIOGEL PI IND STRL 7.5 (GLOVE) ×2 IMPLANT
GLOVE BIOGEL PI IND STRL 9 (GLOVE) ×2 IMPLANT
GLOVE BIOGEL PI INDICATOR 6.5 (GLOVE) ×1
GLOVE BIOGEL PI INDICATOR 7.5 (GLOVE) ×1
GLOVE BIOGEL PI INDICATOR 9 (GLOVE) ×1
GLOVE SURG ORTHO 9.0 STRL STRW (GLOVE) ×3 IMPLANT
GOWN STRL REUS W/ TWL XL LVL3 (GOWN DISPOSABLE) ×4 IMPLANT
GOWN STRL REUS W/TWL XL LVL3 (GOWN DISPOSABLE) ×2
KIT BASIN OR (CUSTOM PROCEDURE TRAY) ×3 IMPLANT
KIT ROOM TURNOVER OR (KITS) ×3 IMPLANT
NS IRRIG 1000ML POUR BTL (IV SOLUTION) ×3 IMPLANT
PACK ORTHO EXTREMITY (CUSTOM PROCEDURE TRAY) ×3 IMPLANT
PAD ABD 8X10 STRL (GAUZE/BANDAGES/DRESSINGS) ×3 IMPLANT
PAD ARMBOARD 7.5X6 YLW CONV (MISCELLANEOUS) ×6 IMPLANT
SPONGE GAUZE 4X4 12PLY (GAUZE/BANDAGES/DRESSINGS) ×3 IMPLANT
SPONGE LAP 18X18 X RAY DECT (DISPOSABLE) ×3 IMPLANT
STOCKINETTE IMPERVIOUS LG (DRAPES) IMPLANT
SUT ETHILON 2 0 PSLX (SUTURE) ×6 IMPLANT
TOWEL OR 17X24 6PK STRL BLUE (TOWEL DISPOSABLE) ×3 IMPLANT
TOWEL OR 17X26 10 PK STRL BLUE (TOWEL DISPOSABLE) ×3 IMPLANT
UNDERPAD 30X30 INCONTINENT (UNDERPADS AND DIAPERS) ×3 IMPLANT
WATER STERILE IRR 1000ML POUR (IV SOLUTION) ×3 IMPLANT

## 2013-05-11 NOTE — Evaluation (Signed)
Physical Therapy Evaluation Patient Details Name: [name] MRN: [id number] DOB: 1960/10/15 Today's Date: 05/11/2013   History of Present Illness  Pt is a 53 y/o male admitted s/p amputation of right first ray and amputation of second digit on right. 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  Clinical Impression  Patient is s/p the above mentioned surgery resulting in the deficits listed below (see PT Problem List). At the time of PT eval, pt appeared motivated to participate in order to progress towards d/c. He was able to demonstrate transfers with min guard assist for safety, however had difficulty maintaining NWB status. Patient will benefit from skilled PT to increase their independence and safety with mobility (while adhering to their precautions) to allow discharge home.     Follow Up Recommendations Home health PT;Supervision for mobility/OOB    Equipment Recommendations  3in1 (PT);Other (comment) (Tub bench)    Recommendations for Other Services       Precautions / Restrictions Precautions Precautions: Fall Precaution Comments: L midfoot amputation previously Restrictions Weight Bearing Restrictions: Yes RLE Weight Bearing: Non weight bearing      Mobility  Bed Mobility Overal bed mobility: Needs Assistance Bed Mobility: Supine to Sit     Supine to sit: Min guard     General bed mobility comments: Increased time to achieve full sitting at EOB. Use of bed rails for assist and min guard for trunk elevation required.   Transfers Overall transfer level: Needs assistance Equipment used: Rolling walker (2 wheeled) Transfers: Sit to/from [facility] Sit to Stand: Min guard Stand pivot transfers: Min guard       General transfer comment: VC's to maintain NWB status on RLE. Pt did not require assist to power-up to full standing, with close guard for pivot around to recliner. Pt had difficulty  maintaining NWB status.   Ambulation/Gait                Stairs            Wheelchair Mobility    Modified Rankin (Stroke Patients Only)       Balance Overall balance assessment: Needs assistance Sitting-balance support: Feet supported;No upper extremity supported Sitting balance-Leahy Scale: Good     Standing balance support: Bilateral upper extremity supported Standing balance-Leahy Scale: Poor Standing balance comment: Feel that pt would not be able to maintain balance without UE assist and also maintain NWB status.                              Pertinent Vitals/Pain Pt reports 3/10 pain with activity.     Home Living Family/patient expects to be discharged to:: Private residence Living Arrangements: Alone Available Help at Discharge: Family;Available 24 hours/day Type of Home: House (Townhome) Home Access: Level entry     Home Layout: Two level;1/2 bath on main level Home Equipment: Wheelchair - Water quality scientist - 2 wheels Additional Comments: Sister staying with pt at d/c    Prior Function Level of Independence: Independent               Hand Dominance   Dominant Hand: Right    Extremity/Trunk Assessment   Upper Extremity Assessment: Overall WFL for tasks assessed           Lower Extremity Assessment: RLE deficits/detail RLE Deficits / Details: Decreased strength and AROM consistent with above mentioned procedure.     Cervical /  Trunk Assessment: Normal  Communication   Communication: No difficulties  Cognition Arousal/Alertness: Awake/alert Behavior During Therapy: WFL for tasks assessed/performed Overall Cognitive Status: Within Functional Limits for tasks assessed                      General Comments      Exercises General Exercises - Lower Extremity Ankle Circles/Pumps: 10 reps Long Arc Quad: 10 reps      Assessment/Plan    PT Assessment Patient needs continued PT services  PT  Diagnosis Difficulty walking;Acute pain   PT Problem List Decreased strength;Decreased range of motion;Decreased activity tolerance;Decreased balance;Decreased mobility;Decreased safety awareness;Decreased knowledge of use of DME;Decreased knowledge of precautions;Pain  PT Treatment Interventions DME instruction;Gait training;Stair training;Functional mobility training;Therapeutic activities;Therapeutic exercise;Neuromuscular re-education;Patient/family education   PT Goals (Current goals can be found in the Care Plan section) Acute Rehab PT Goals Patient Stated Goal: To return home PT Goal Formulation: With patient Time For Goal Achievement: 05/25/13 Potential to Achieve Goals: Good    Frequency Min 3X/week   Barriers to discharge        Co-evaluation               End of Session Equipment Utilized During Treatment: Gait belt Activity Tolerance: Patient tolerated treatment well Patient left: in chair;with call bell/phone within reach;with family/visitor present Nurse Communication: Mobility status         Time: [id number] PT Time Calculation (min): 30 min   Charges:   PT Evaluation $Initial PT Evaluation Tier I: 1 Procedure PT Treatments $Therapeutic Activity: 23-37 mins   PT G CodesJolyn Lent 05/11/2013, 4:00 PM  Jolyn Lent, PT, DPT Acute Rehabilitation Services Pager: [phone]

## 2013-05-11 NOTE — ED Notes (Signed)
Pt taken off Bipap

## 2013-05-11 NOTE — Consult Note (Signed)
Amputation planned per Dr. Sharol Given. WOC will not evaluate for this reason. Renea Schoonmaker Olinda RN,CWOCN [id number]

## 2013-05-11 NOTE — Op Note (Signed)
OPERATIVE REPORT  DATE OF SURGERY: 05/11/2013  PATIENT:  [name],  53 y.o. 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 Patient was admitted last night do to systemic symptoms he was placed on vancomycin and Zosyn and presents at this time for surgical intervention. Risk and benefits were discussed including risk of the wound not healing. Patient states he understands and wished to proceed at this time. Description of procedure patient was brought to the operating room and underwent a general anesthetic. After adequate levels and anesthesia were obtained patient's right lower extremity was prepped using DuraPrep draped into a sterile field. A racquet incision was made around the toe and the ulcer. There was a deep abscess purulent drainage and extreme amount of necrotic tissue. The skin was debrided back to healthy viable bleeding tissue and this necessitated amputation of the second ray to facilitate wound closure. Patient underwent amputation of the first and second ray. The wound edges were bleeding the wound was irrigated with normal saline the incision was closed using 2-0 nylon a sterile dressing was applied. Patient was extubated taken to the PACU in stable condition.  PLAN OF CARE: Admit to inpatient   PATIENT DISPOSITION:  PACU - hemodynamically stable.   [name], MD 05/11/2013 12:50 PM

## 2013-05-11 NOTE — Anesthesia Preprocedure Evaluation (Addendum)
Anesthesia Evaluation  Patient identified by MRN, date of birth, ID band Patient awake    Airway Mallampati: II      Dental   Pulmonary pneumonia -, former smoker,  breath sounds clear to auscultation        Cardiovascular hypertension, + Peripheral Vascular Disease and +CHF Rhythm:Regular Rate:Normal     Neuro/Psych    GI/Hepatic GERD-  ,  Endo/Other  diabetes  Renal/GU      Musculoskeletal   Abdominal   Peds  Hematology   Anesthesia Other Findings   Reproductive/Obstetrics                          Anesthesia Physical Anesthesia Plan  ASA: III  Anesthesia Plan: General   Post-op Pain Management:    Induction: Intravenous  Airway Management Planned: Oral ETT  Additional Equipment:   Intra-op Plan:   Post-operative Plan: Possible Post-op intubation/ventilation  Informed Consent: I have reviewed the patients History and Physical, chart, labs and discussed the procedure including the risks, benefits and alternatives for the proposed anesthesia with the patient or authorized representative who has indicated his/her understanding and acceptance.   Dental advisory given  Plan Discussed with: CRNA and Anesthesiologist  Anesthesia Plan Comments:         Anesthesia Quick Evaluation

## 2013-05-11 NOTE — Progress Notes (Signed)
Pt leaving for a chest xray. MD stated ok for pt to go without nurse. Pt has been stable. Pt has had no complaints. Will pass on to night nurse.

## 2013-05-11 NOTE — Progress Notes (Signed)
Pt just returned to floor from surgery.  Pt is alert and oriented x 4. Pt has family visiting. Pt's vitals within normal limits. Pt resting with no requests at this time. Central telemetry notified.

## 2013-05-11 NOTE — Progress Notes (Signed)
Pt has gone to surgery. Pt in no distress on departure. Pt's surgical nurse took over care.

## 2013-05-11 NOTE — Progress Notes (Signed)
05/11/2013 1430 UR completed. Jonnie Finner RN CCM Case Mmgt phone [phone]

## 2013-05-11 NOTE — Progress Notes (Signed)
Received report from night nurse Estill Bamberg that pt was in MRI at changed of shift. Pt has not come to floor as of yet from MRI. Will assess patient for first time when he returns to floor.

## 2013-05-11 NOTE — H&P (Signed)
  Date: 05/11/2013  Patient name: [name]  Medical record number: [id number]  Date of birth: 03/11/1960   I have seen and evaluated [name] and discussed their care with the Residency Team. Mr Volkov was admitted for sepsis and planned amputation of R great toe for osteo 2/2 injury and poorly controlled DM. Pt initially injured his R great toe in Dec. It was healing but then re injured it in Jan 2015. He was tx with ABX Cipro and Doxy Feb 2015 and was being seen in Wound Care. Dr Sharol Given sch amputation for 05/10/13 but presented early for fever, anorexia, N/V, and dizziness. In the ED, he developed acute resp failure with repeat O2 sat in the 70's and 80's. This lasted about 20 min and required NRB. He has since stabilized and is on 2 L Sherrill sat 100%. Dr Sharol Given has taken the pt to the OR for amputation of R 1st and 2nd toe.  On exam, he is alert and in no distress. No access muscle usage, no tachypnea. LCATB. HRRR. Toe - entire plantar surface or R great toe is ulcer with exposed tendon.   T 100.4 Na 124. Gap 19. Bicarb 21. WbC 17. Pro-BNP 2600  Assessment and Plan: I have seen and evaluated the patient as outlined above. I agree with the formulated Assessment and Plan as detailed in the residents' admission note, with the following changes:   1. Severe sepsis 2/2 osteo +/- pneumonia - Pt has stabilized with IVF and ABX. He has had the infected toe removed. Wound care per Dr Sharol Given and wound care nurse.  2. Acute resp failure - he has since stabilized. The cause is not clear - does not appear to be vol overloaded. CXR shows R sided infiltrate so will treat for HCAP and aspiration with Vanc and Zosyn. Cont O2 as needed. Will need repeat CXR 9th or 10th  3. Gap without acidosis - confusing picture. Team is appropriately concerned about DKA as GI loss could increase the bicarb. The urine ketones could be explained by starvation and the gap by sepsis. Follow BMP today and consider insulin gtt if any concern  remains. He could also tolerate the IVF if DKA remains likely.   4. Hyponatremia - Urine osmolality is inappropriately high due. Most likely cause is vol contraction. Agree wit hIVF and rechecking levels.   Bartholomew Crews, MD 5/8/20151:58 PM

## 2013-05-11 NOTE — Progress Notes (Addendum)
Pt needs a second iv stat. Called iv team to come place IV. Pt is suppose to get a bolus of saline. Pt is dehydrated and also has tough skin with a lot of scar tissue therefore getting iv team to place.
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
## 2013-05-11 NOTE — Progress Notes (Signed)
Inpatient Diabetes Program Recommendations  AACE/ADA: New Consensus Statement on Inpatient Glycemic Control (2013)  Target Ranges:  Prepandial:   less than 140 mg/dL      Peak postprandial:   less than 180 mg/dL (1-2 hours)      Critically ill patients:  140 - 180 mg/dL   Results for GEFFERY, SCULTHORPE (MRN [id number]) as of 05/11/2013 09:35  Ref. Range 05/11/2013 01:09 05/11/2013 03:48 05/11/2013 05:02 05/11/2013 08:34  Glucose-Capillary Latest Range: 70-99 mg/dL 427 (H) 405 (H) 384 (H) 261 (H)   Diabetes history: DM2 Outpatient Diabetes medications: Lantus 70 units QHS, Novolog 27 units TID with meals, and Metformin 1000 mg BID Current orders for Inpatient glycemic control: Novolog 0-15 units Q4H  Inpatient Diabetes Program Recommendations Insulin - Basal: Please consider ordering Lantus 40 units daily (starting now).  Note: Patient is NPO for anticipated toe amputation surgery today. Patient uses basal insulin at home and during last hospital admission was ordered Lantus 70 units QHS, Novolog 0-15 units ACHS, and Novolog 20 units TID with meals for meal coverage for inpatient glycemic control.  Since patient will be NPO, please consider ordering Lantus 40 units daily.  Thanks, Barnie Alderman, RN, MSN, CCRN Diabetes Coordinator Inpatient Diabetes Program [phone] (Team Pager) [phone] ([facility] office) [phone] [facility] office)

## 2013-05-11 NOTE — Transfer of Care (Signed)
Immediate Anesthesia Transfer of Care Note  Patient: [name]  Procedure(s) Performed: Procedure(s) with comments: AMPUTATION RAY (Right) - Right Foot 1st Ray Amputation AMPUTATION DIGIT, right second toe (Right)  Patient Location: PACU  Anesthesia Type:General  Level of Consciousness: awake, alert , oriented and sedated  Airway & Oxygen Therapy: Patient Spontanous Breathing and Patient connected to nasal cannula oxygen  Post-op Assessment: Report given to PACU RN, Post -op Vital signs reviewed and stable and Patient moving all extremities  Post vital signs: Reviewed and stable  Complications: No apparent anesthesia complications

## 2013-05-11 NOTE — ED Notes (Signed)
Attempted report 

## 2013-05-11 NOTE — Progress Notes (Addendum)
Subjective:  Pt seen and examined in AM. Pt reports his breathing is improved. His right foot pain is well controlled. He is no longer nauseous. Going to surgery in AM for amputation.    Objective: Vital signs in last 24 hours: Filed Vitals:   05/11/13 1330 05/11/13 1345 05/11/13 1400 05/11/13 1603  BP: 120/70 126/70 131/72 96/60  Pulse: 91 90 93 89  Temp:   98.9 F (37.2 C)   TempSrc:      Resp: 24 24 22 20   Height:      Weight:      SpO2: 100% 100% 100% 99%   Weight change:   Intake/Output Summary (Last 24 hours) at 05/11/13 1754 Last data filed at 05/11/13 1307  Gross per 24 hour  Intake    303 ml  Output   1050 ml  Net   -747 ml   Physical Exam: General: NAD Heart: Regular rate and rhythm Lungs: Clear to ausculation b/l. No rales, ronchi, or wheezing  Abdomen: Soft, non-tender, non-distended, normal BS Extremities: Right great plantar toe ulceration with exposed tendon and malodor. S/p left midfoot amputation  Lab Results: Basic Metabolic Panel:  Recent Labs Lab 05/10/13 2324 05/11/13 1030  NA 124* 128*  K 4.8 4.0  CL 84* 90*  CO2 21 23  GLUCOSE 380* 275*  BUN 20 17  CREATININE 1.25 1.16  CALCIUM 8.0* 8.3*   Liver Function Tests:  Recent Labs Lab 05/10/13 1322 05/10/13 1750  AST 52* 44*  ALT 48 46  ALKPHOS 214* 199*  BILITOT 1.3* 1.1  PROT 7.7 7.3  ALBUMIN 2.5* 2.3*    Recent Labs Lab 05/10/13 1322  LIPASE 11   CBC:  Recent Labs Lab 05/10/13 1322 05/11/13 0349  WBC 19.1* 17.2*  NEUTROABS 17.8*  --   HGB 9.6* 8.2*  HCT 28.1* 24.9*  MCV 81.7 82.5  PLT 321 284   BNP:  Recent Labs Lab 05/10/13 2324  PROBNP 2639.0*   CBG:  Recent Labs Lab 05/11/13 0348 05/11/13 0502 05/11/13 0834 05/11/13 1133 05/11/13 1314 05/11/13 1602  GLUCAP 405* 384* 261* 253* 263* 286*   Thyroid Function Tests:  Recent Labs Lab 05/11/13 0349  TSH 0.869    Urinalysis:  Recent Labs Lab 05/10/13 1916  COLORURINE YELLOW  LABSPEC  1.019  PHURINE 5.5  GLUCOSEU >1000*  HGBUR LARGE*  BILIRUBINUR NEGATIVE  KETONESUR 15*  PROTEINUR >300*  UROBILINOGEN 0.2  NITRITE NEGATIVE  LEUKOCYTESUR NEGATIVE     Micro Results: Recent Results (from the past 240 hour(s))  MRSA PCR SCREENING     Status: None   Collection Time    05/11/13  1:45 AM      Result Value Ref Range Status   MRSA by PCR NEGATIVE  NEGATIVE Final   Comment:            The GeneXpert MRSA Assay (FDA     approved for NASAL specimens     only), is one component of a     comprehensive MRSA colonization     surveillance program. It is not     intended to diagnose MRSA     infection nor to guide or     monitor treatment for     MRSA infections.   Studies/Results: Mr Foot Right W Wo Contrast  05/11/2013   CLINICAL DATA:  Diabetic foot ulcer.  Osteomyelitis.  EXAM: MRI OF THE RIGHT FOREFOOT WITHOUT AND WITH CONTRAST  TECHNIQUE: Multiplanar, multisequence MR imaging was performed both before  and after administration of intravenous contrast.  CONTRAST:  12mL MULTIHANCE GADOBENATE DIMEGLUMINE 529 MG/ML IV SOLN  COMPARISON:  05/10/2013  FINDINGS: Prior amputation of the fifth digit at the mid metatarsal level.  The diffuse abnormal osseous edema and enhancement in the distal phalanx great toe with geographic abnormal osseous edema and enhancement in the proximal phalanx great toe compatible with osteomyelitis. There is ulceration along the plantar side of the interphalangeal joint of the great toe with severe soft tissue inflammation and possible exposure of the distal flexor hallucis longus.  There is patchy edema in a speckled pattern throughout the first metatarsal but without significant accompanying enhancement. There is poor fat saturation in the fourth toe reducing sensitivity for osteomyelitis in the fourth toe, although given the fairly high T1 signal in the phalanges of the fourth toe I doubt that there is osteomyelitis involving the fourth toe.  Lisfranc  ligament intact. Abnormal edema and enhancement observed along the plantar musculature of the foot and in the dorsal subcutaneous tissues compatible with cellulitis and myositis.  IMPRESSION: 1. Active osteomyelitis in the phalanges of the great toe, with severe plantar ulceration along the interphalangeal joint of the great toe and extensive adjacent cellulitis. Probable myositis along the plantar musculature of the foot. 2. Patchy edema signal in the first metatarsal but without associated enhancement, accordingly likely reactive or from osteoporosis of disuse rather than due to active infection in the first metatarsal. 3. Prior fifth digit amputation.   Electronically Signed   By: Sherryl Barters M.D.   On: 05/11/2013 08:58   Dg Chest Port 1 View  05/10/2013   CLINICAL DATA:  Sudden worsening of shortness of breath  EXAM: PORTABLE CHEST - 1 VIEW  COMPARISON:  Chest x-ray from the same day at 3:02 p.m.  FINDINGS: New, fairly dense right base interstitial and airspace disease. Stable heart size for technique. Unremarkable upper mediastinal contours. No pneumothorax or significant effusion.  IMPRESSION: Rapid onset right-sided airspace disease. Timing favors aspiration, asymmetric pulmonary edema, or rapidly progressive pneumonia.   Electronically Signed   By: Jorje Guild M.D.   On: 05/10/2013 21:21   Dg Abd Acute W/chest  05/10/2013   CLINICAL DATA:  Emesis and fatigue for 5 days.  EXAM: ACUTE ABDOMEN SERIES (ABDOMEN 2 VIEW & CHEST 1 VIEW)  COMPARISON:  DG CHEST 2 VIEW dated 03/27/2013  FINDINGS: Cardiomediastinal silhouette is unremarkable. Lungs are clear, no pleural effusions. No pneumothorax. Soft tissue planes and included osseous structures are unremarkable.  Bowel gas pattern is nondilated and nonobstructive. No intra-abdominal mass effect, pathologic calcifications or free air. Soft tissue planes and included osseous structures are nonsuspicious. Mild to moderate vascular calcifications.   IMPRESSION: Negative abdominal radiographs.  No acute cardiopulmonary disease.   Electronically Signed   By: Elon Alas   On: 05/10/2013 15:15   Dg Foot 2 Views Right  05/10/2013   CLINICAL DATA:  Patient has ulcer on the right foot. Scheduled for surgery tomorrow  EXAM: RIGHT FOOT - 2 VIEW  COMPARISON:  None.  FINDINGS: There is evidence of prior amputation of the left fifth metatarsal with the base remaining. There is bone destruction of the first distal phalanx in the distal aspect of the first proximal phalanx consistent with osteomyelitis. There is an overlying soft tissue ulcer.  The remainder of the osseous structures demonstrate no focal abnormality. There is no fracture or dislocation.  IMPRESSION: Bone destruction of the distal aspect of the first proximal phalanx and the first distal  phalanx most consistent with osteomyelitis.   Electronically Signed   By: Kathreen Devoid   On: 05/10/2013 19:42   Medications: I have reviewed the patient's current medications. Scheduled Meds: . aspirin EC  325 mg Oral Daily  . atorvastatin  10 mg Oral q1800  . gabapentin  600 mg Oral TID  . heparin  5,000 Units Subcutaneous 3 times per day  . insulin aspart  0-15 Units Subcutaneous TID WC  . insulin aspart  0-5 Units Subcutaneous QHS  . piperacillin-tazobactam (ZOSYN)  IV  3.375 g Intravenous Q8H  . sodium chloride  3 mL Intravenous Q12H  . 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 Etiology possibly due to acute on chronic diastolic CHF. -Oxygen supplementation to keep SpO2 >92% -NIMV if needed -Albuterol nebulizer Q 6 hr PRN -Continue IV antibiotics to treat for possible HCAP/aspiration PNA  Hyponatremia - Currently 128. Etiology likely due to hypovolemia, unclear if symptomatic with vomiting.  -NS bolus as needed  -Monitor BMP  Anion Gap Metabolic Acidosis - AG 17. Most likely due to severe sepsis.  -Monitor BMP -Consider DKA protocol   Insulin-dependent Type II DM - Last A1c of >14 on 2/15. Pt at home on metformin 1000 BID,  Lantus 70 U, and Novolog 27 U TID.  -F/U A1c  -Lantus 35 U daily -Resistant SSI with meals & bedtime -Monitor CBG    Grade 1 Diastolic Chronic Heart Failure - Last 2D-echo on 03/28/13 with EF 55-60% and grade 1 dysfunction. Pro-BNP 2639 on admission -Monitor volume status -Daily weight and strict I & O's  Normocytic anemia - Hg stable near baseline 10. -Transfuse if Hg<7 -Monitor CBC  Hyperlipidemia - Last lipid panel on 05/12/11 with cholesterol 206, TG 100, HDL 40, LDL 146. Pt at home on crestor 5 mg daily. -Atorvastatin 10 mg daily   Diet: Carb modifed DVT Ppx: SQ Heparin TID  Code: Full  Dispo: Disposition is deferred at this time, awaiting improvement of current medical problems.    The patient does have a current PCP (Neema Bobbie Stack, MD) and does need an [facility] hospital follow-up appointment after discharge.  The patient does not have transportation limitations that hinder transportation to clinic appointments.  .Services Needed at time of discharge: Y = Yes, Blank = No PT:  Yes  OT:   RN:   Equipment:  3-in-1; tub bench  Other:     LOS: 1 day   Juluis Mire, MD 05/11/2013, 5:54 PM

## 2013-05-11 NOTE — Anesthesia Postprocedure Evaluation (Signed)
  Anesthesia Post-op Note  Patient: [name]  Procedure(s) Performed: Procedure(s) with comments: AMPUTATION RAY (Right) - Right Foot 1st Ray Amputation AMPUTATION DIGIT, right second toe (Right)  Patient Location: PACU  Anesthesia Type:General  Level of Consciousness: awake  Airway and Oxygen Therapy: Patient Spontanous Breathing  Post-op Pain: mild  Post-op Assessment: Post-op Vital signs reviewed  Post-op Vital Signs: Reviewed  Last Vitals:  Filed Vitals:   05/11/13 1400  BP: 131/72  Pulse: 93  Temp: 37.2 C  Resp: 22    Complications: No apparent anesthesia complications

## 2013-05-12 DIAGNOSIS — A4901 Methicillin susceptible Staphylococcus aureus infection, unspecified site: Secondary | ICD-10-CM

## 2013-05-12 LAB — BASIC METABOLIC PANEL
BUN: 16 mg/dL (ref 6–23)
CALCIUM: 8 mg/dL — AB (ref 8.4–10.5)
CO2: 22 mEq/L (ref 19–32)
CREATININE: 0.97 mg/dL (ref 0.50–1.35)
Chloride: 91 mEq/L — ABNORMAL LOW (ref 96–112)
Glucose, Bld: 371 mg/dL — ABNORMAL HIGH (ref 70–99)
Potassium: 4.1 mEq/L (ref 3.7–5.3)
Sodium: 129 mEq/L — ABNORMAL LOW (ref 137–147)

## 2013-05-12 LAB — CBC WITH DIFFERENTIAL/PLATELET
Basophils Absolute: 0 K/uL (ref 0.0–0.1)
Basophils Absolute: 0 K/uL (ref 0.0–0.1)
Basophils Relative: 0 % (ref 0–1)
Basophils Relative: 0 % (ref 0–1)
Eosinophils Absolute: 0 K/uL (ref 0.0–0.7)
Eosinophils Absolute: 0 K/uL (ref 0.0–0.7)
Eosinophils Relative: 0 % (ref 0–5)
Eosinophils Relative: 0 % (ref 0–5)
HCT: 23.9 % — ABNORMAL LOW (ref 39.0–52.0)
HCT: 24.1 % — ABNORMAL LOW (ref 39.0–52.0)
Hemoglobin: 7.9 g/dL — ABNORMAL LOW (ref 13.0–17.0)
Hemoglobin: 8 g/dL — ABNORMAL LOW (ref 13.0–17.0)
Lymphocytes Relative: 6 % — ABNORMAL LOW (ref 12–46)
Lymphocytes Relative: 8 % — ABNORMAL LOW (ref 12–46)
Lymphs Abs: 0.9 K/uL (ref 0.7–4.0)
Lymphs Abs: 1.1 K/uL (ref 0.7–4.0)
MCH: 27.5 pg (ref 26.0–34.0)
MCH: 28.1 pg (ref 26.0–34.0)
MCHC: 33.1 g/dL (ref 30.0–36.0)
MCHC: 33.2 g/dL (ref 30.0–36.0)
MCV: 83.3 fL (ref 78.0–100.0)
MCV: 84.6 fL (ref 78.0–100.0)
Monocytes Absolute: 0.8 K/uL (ref 0.1–1.0)
Monocytes Absolute: 1 K/uL (ref 0.1–1.0)
Monocytes Relative: 6 % (ref 3–12)
Monocytes Relative: 7 % (ref 3–12)
Neutro Abs: 11.9 K/uL — ABNORMAL HIGH (ref 1.7–7.7)
Neutro Abs: 12.7 K/uL — ABNORMAL HIGH (ref 1.7–7.7)
Neutrophils Relative %: 85 % — ABNORMAL HIGH (ref 43–77)
Neutrophils Relative %: 88 % — ABNORMAL HIGH (ref 43–77)
Platelets: 268 K/uL (ref 150–400)
Platelets: 286 K/uL (ref 150–400)
RBC: 2.85 MIL/uL — ABNORMAL LOW (ref 4.22–5.81)
RBC: 2.87 MIL/uL — ABNORMAL LOW (ref 4.22–5.81)
RDW: 14.1 % (ref 11.5–15.5)
RDW: 14.1 % (ref 11.5–15.5)
WBC: 14 K/uL — ABNORMAL HIGH (ref 4.0–10.5)
WBC: 14.4 K/uL — ABNORMAL HIGH (ref 4.0–10.5)

## 2013-05-12 LAB — GLUCOSE, CAPILLARY
GLUCOSE-CAPILLARY: 380 mg/dL — AB (ref 70–99)
Glucose-Capillary: 355 mg/dL — ABNORMAL HIGH (ref 70–99)
Glucose-Capillary: 399 mg/dL — ABNORMAL HIGH (ref 70–99)
Glucose-Capillary: 306 mg/dL — ABNORMAL HIGH (ref 70–99)

## 2013-05-12 LAB — HEMOGLOBIN A1C
Hgb A1c MFr Bld: 11.5 % — ABNORMAL HIGH
Mean Plasma Glucose: 283 mg/dL — ABNORMAL HIGH

## 2013-05-12 LAB — VANCOMYCIN, TROUGH: Vancomycin Tr: 11.2 ug/mL (ref 10.0–20.0)

## 2013-05-12 MED ORDER — CEFAZOLIN SODIUM 1-5 GM-% IV SOLN
1.0000 g | Freq: Three times a day (TID) | INTRAVENOUS | Status: DC
  Administered 2013-05-12 – 2013-05-14 (×6): 1 g via INTRAVENOUS
  Filled 2013-05-12 (×9): qty 50

## 2013-05-12 MED ORDER — FUROSEMIDE 10 MG/ML IJ SOLN
40.0000 mg | Freq: Once | INTRAMUSCULAR | Status: AC
  Administered 2013-05-12: 40 mg via INTRAVENOUS
  Filled 2013-05-12: qty 4

## 2013-05-12 MED ORDER — INSULIN GLARGINE 100 UNIT/ML ~~LOC~~ SOLN
40.0000 [IU] | Freq: Every day | SUBCUTANEOUS | Status: DC
  Administered 2013-05-12: 40 [IU] via SUBCUTANEOUS
  Filled 2013-05-12 (×3): qty 0.4

## 2013-05-12 MED ORDER — VANCOMYCIN HCL 10 G IV SOLR
1500.0000 mg | Freq: Two times a day (BID) | INTRAVENOUS | Status: DC
  Administered 2013-05-13 – 2013-05-15 (×6): 1500 mg via INTRAVENOUS
  Filled 2013-05-12 (×9): qty 1500

## 2013-05-12 NOTE — Progress Notes (Addendum)
Subjective:  Pt seen and examined in AM. Pt with CBG 410 last night. Reports feeling much better after surgery.  His pain is well controlled with dilaudid. No dyspnea, mild cough, with no fever, chills, abdominal pain, nausea, or vomiting.    Objective: Vital signs in last 24 hours: Filed Vitals:   05/11/13 2012 05/12/13 0012 05/12/13 0452 05/12/13 0800  BP: 116/64 124/67 101/46 124/67  Pulse: 90 88 95 92  Temp: 97.5 F (36.4 C) 97.6 F (36.4 C) 97.8 F (36.6 C) 98.2 F (36.8 C)  TempSrc: Oral Oral Oral Oral  Resp: 21 25 23 24   Height:      Weight:      SpO2: 100% 100% 100%    Weight change:   Intake/Output Summary (Last 24 hours) at 05/12/13 0905 Last data filed at 05/12/13 0538  Gross per 24 hour  Intake   2233 ml  Output   1550 ml  Net    683 ml   Physical Exam: General: NAD Heart: Regular rate and rhythm Lungs: Clear to ausculation b/l. 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 with c/d/i dressing & 2nd  left midfoot amputation  Lab Results: Basic Metabolic Panel:  Recent Labs Lab 05/11/13 1720 05/11/13 2300  NA 128* 127*  K 4.4 4.6  CL 91* 91*  CO2 20 22  GLUCOSE 360* 382*  BUN 18 18  CREATININE 1.04 0.98  CALCIUM 8.1* 7.9*   Liver Function Tests:  Recent Labs Lab 05/10/13 1322 05/10/13 1750  AST 52* 44*  ALT 48 46  ALKPHOS 214* 199*  BILITOT 1.3* 1.1  PROT 7.7 7.3  ALBUMIN 2.5* 2.3*    Recent Labs Lab 05/10/13 1322  LIPASE 11   CBC:  Recent Labs Lab 05/11/13 2351 05/12/13 0349  WBC 14.4* 14.0*  NEUTROABS 12.7* 11.9*  HGB 7.9* 8.0*  HCT 23.9* 24.1*  MCV 83.3 84.6  PLT 286 268   BNP:  Recent Labs Lab 05/10/13 2324  PROBNP 2639.0*   CBG:  Recent Labs Lab 05/11/13 0502 05/11/13 0834 05/11/13 1133 05/11/13 1314 05/11/13 1602 05/11/13 2323  GLUCAP 384* 261* 253* 263* 286* 410*   Thyroid Function Tests:  Recent Labs Lab  05/11/13 0349  TSH 0.869    Urinalysis:  Recent Labs Lab 05/10/13 1916  COLORURINE YELLOW  LABSPEC 1.019  PHURINE 5.5  GLUCOSEU >1000*  HGBUR LARGE*  BILIRUBINUR NEGATIVE  KETONESUR 15*  PROTEINUR >300*  UROBILINOGEN 0.2  NITRITE NEGATIVE  LEUKOCYTESUR NEGATIVE     Micro Results: Recent Results (from the past 240 hour(s))  URINE CULTURE     Status: None   Collection Time    05/10/13  7:16 PM      Result Value Ref Range Status   Specimen Description URINE, RANDOM   Final   Special Requests NONE   Final   Culture  Setup Time     Final   Value: 05/11/2013 02:42     Performed at [facility] Count     Final   Value: NO GROWTH     Performed at [facility]   Culture     Final   Value: NO GROWTH     Performed at [facility]   Report Status 05/11/2013 FINAL   Final  CULTURE, BLOOD (ROUTINE X 2)     Status: None   Collection Time    05/10/13 11:24 PM  Result Value Ref Range Status   Specimen Description BLOOD RIGHT FOREARM   Final   Special Requests BOTTLES DRAWN AEROBIC AND ANAEROBIC 10ML   Final   Culture  Setup Time     Final   Value: 05/11/2013 03:52     Performed at [facility]   Culture     Final   Value: Lonell Grandchild NEGATIVE RODS     GRAM POSITIVE COCCI IN PAIRS AND CHAINS     GRAM POSITIVE COCCI IN CLUSTERS     Note: Gram Stain Report Called to,Read Back By and Verified With: DARA ANDERSON ON 05/11/2013 AT 6:32P BY WILEJ     Performed at [facility]   Report Status PENDING   Incomplete  CULTURE, BLOOD (ROUTINE X 2)     Status: None   Collection Time    05/10/13 11:27 PM      Result Value Ref Range Status   Specimen Description BLOOD RIGHT FOREARM   Final   Special Requests BOTTLES DRAWN AEROBIC AND ANAEROBIC 10ML   Final   Culture  Setup Time     Final   Value: 05/11/2013 03:51     Performed at [facility]   Culture     Final   Value: Lonell Grandchild NEGATIVE RODS     GRAM POSITIVE COCCI IN PAIRS AND  CHAINS     GRAM POSITIVE COCCI IN CLUSTERS     Note: Gram Stain Report Called to,Read Back By and Verified With: DARA ANDERSON ON 05/11/2013 AT 6:32P BY WILEJ     Performed at [facility]   Report Status PENDING   Incomplete  MRSA PCR SCREENING     Status: None   Collection Time    05/11/13  1:45 AM      Result Value Ref Range Status   MRSA by PCR NEGATIVE  NEGATIVE Final   Comment:            The GeneXpert MRSA Assay (FDA     approved for NASAL specimens     only), is one component of a     comprehensive MRSA colonization     surveillance program. It is not     intended to diagnose MRSA     infection nor to guide or     monitor treatment for     MRSA infections.   Studies/Results: Dg Chest 2 View  05/11/2013   CLINICAL DATA:  dyspnea  EXAM: CHEST  2 VIEW  COMPARISON:  DG CHEST 1V PORT dated 05/10/2013  FINDINGS: Low lung volumes. Cardiac silhouette is enlarged. The area of increased density within the right hemithorax has improved in the interim consistent with improved pulmonary edema. There is mild residual density right lung base. No new focal regions of consolidation and focal infiltrates. No acute osseous abnormalities.  IMPRESSION: Resolving asymmetric edema on the right. No acute cardiopulmonary disease.   Electronically Signed   By: Margaree Mackintosh M.D.   On: 05/11/2013 20:06   Mr Foot Right W Wo Contrast  05/11/2013   CLINICAL DATA:  Diabetic foot ulcer.  Osteomyelitis.  EXAM: MRI OF THE RIGHT FOREFOOT WITHOUT AND WITH CONTRAST  TECHNIQUE: Multiplanar, multisequence MR imaging was performed both before and after administration of intravenous contrast.  CONTRAST:  39mL MULTIHANCE GADOBENATE DIMEGLUMINE 529 MG/ML IV SOLN  COMPARISON:  05/10/2013  FINDINGS: Prior amputation of the fifth digit at the mid metatarsal level.  The diffuse abnormal osseous edema and enhancement in the distal phalanx great  toe with geographic abnormal osseous edema and enhancement in the proximal phalanx  great toe compatible with osteomyelitis. There is ulceration along the plantar side of the interphalangeal joint of the great toe with severe soft tissue inflammation and possible exposure of the distal flexor hallucis longus.  There is patchy edema in a speckled pattern throughout the first metatarsal but without significant accompanying enhancement. There is poor fat saturation in the fourth toe reducing sensitivity for osteomyelitis in the fourth toe, although given the fairly high T1 signal in the phalanges of the fourth toe I doubt that there is osteomyelitis involving the fourth toe.  Lisfranc ligament intact. Abnormal edema and enhancement observed along the plantar musculature of the foot and in the dorsal subcutaneous tissues compatible with cellulitis and myositis.  IMPRESSION: 1. Active osteomyelitis in the phalanges of the great toe, with severe plantar ulceration along the interphalangeal joint of the great toe and extensive adjacent cellulitis. Probable myositis along the plantar musculature of the foot. 2. Patchy edema signal in the first metatarsal but without associated enhancement, accordingly likely reactive or from osteoporosis of disuse rather than due to active infection in the first metatarsal. 3. Prior fifth digit amputation.   Electronically Signed   By: Sherryl Barters M.D.   On: 05/11/2013 08:58   Dg Chest Port 1 View  05/10/2013   CLINICAL DATA:  Sudden worsening of shortness of breath  EXAM: PORTABLE CHEST - 1 VIEW  COMPARISON:  Chest x-ray from the same day at 3:02 p.m.  FINDINGS: New, fairly dense right base interstitial and airspace disease. Stable heart size for technique. Unremarkable upper mediastinal contours. No pneumothorax or significant effusion.  IMPRESSION: Rapid onset right-sided airspace disease. Timing favors aspiration, asymmetric pulmonary edema, or rapidly progressive pneumonia.   Electronically Signed   By: Jorje Guild M.D.   On: 05/10/2013 21:21   Dg Abd  Acute W/chest  05/10/2013   CLINICAL DATA:  Emesis and fatigue for 5 days.  EXAM: ACUTE ABDOMEN SERIES (ABDOMEN 2 VIEW & CHEST 1 VIEW)  COMPARISON:  DG CHEST 2 VIEW dated 03/27/2013  FINDINGS: Cardiomediastinal silhouette is unremarkable. Lungs are clear, no pleural effusions. No pneumothorax. Soft tissue planes and included osseous structures are unremarkable.  Bowel gas pattern is nondilated and nonobstructive. No intra-abdominal mass effect, pathologic calcifications or free air. Soft tissue planes and included osseous structures are nonsuspicious. Mild to moderate vascular calcifications.  IMPRESSION: Negative abdominal radiographs.  No acute cardiopulmonary disease.   Electronically Signed   By: Elon Alas   On: 05/10/2013 15:15   Dg Foot 2 Views Right  05/10/2013   CLINICAL DATA:  Patient has ulcer on the right foot. Scheduled for surgery tomorrow  EXAM: RIGHT FOOT - 2 VIEW  COMPARISON:  None.  FINDINGS: There is evidence of prior amputation of the left fifth metatarsal with the base remaining. There is bone destruction of the first distal phalanx in the distal aspect of the first proximal phalanx consistent with osteomyelitis. There is an overlying soft tissue ulcer.  The remainder of the osseous structures demonstrate no focal abnormality. There is no fracture or dislocation.  IMPRESSION: Bone destruction of the distal aspect of the first proximal phalanx and the first distal phalanx most consistent with osteomyelitis.   Electronically Signed   By: Kathreen Devoid   On: 05/10/2013 19:42   Medications: I have reviewed the patient's current medications. Scheduled Meds: . aspirin EC  325 mg Oral Daily  . atorvastatin  10 mg Oral  q1800  . gabapentin  600 mg Oral TID  . heparin  5,000 Units Subcutaneous 3 times per day  . insulin aspart  0-20 Units Subcutaneous TID WC  . insulin aspart  0-5 Units Subcutaneous QHS  . insulin glargine  35 Units Subcutaneous QHS  . piperacillin-tazobactam (ZOSYN)  IV   3.375 g Intravenous Q8H  . sodium chloride  3 mL Intravenous Q12H  . vancomycin  1,000 mg Intravenous Q12H   Continuous Infusions:  PRN Meds:.acetaminophen, albuterol, HYDROmorphone (DILAUDID) injection, metoCLOPramide (REGLAN) injection, metoCLOPramide, ondansetron (ZOFRAN) IV, ondansetron, oxyCODONE-acetaminophen Assessment/Plan:   Severe sepsis due to Osteomyelitis of Right Great Toe s/p amputation of 1st & 2nd Ray amputation on 5/8 - afebrile with improving leukocytosis -Appreciate orthopedic surgery following  -Continue IV vancomycin and zosyn (Day 2 of 6 week therapy) -Monitor CBC and fever curve  -Dilaudid Q 2 hr PRN -Percocet/Roxicet Q 4hr PRN -Gabapentin 600 mg TID  -Hold home carvedilol 3.125 BID and lasix 20 mg BID  -PT/OT therapies  -Non-weight bearing to right LE -SCD's & aspirin 325 mg daily for DVT PPx  Bacteremia - Pt with blood cultures (2/2) from 5/7 with Proteus Mirabilis and Staph Aureus.    -ID consult  -Awaiting sensitivities   -Repeat blood cultures  -Continue IV vancomycin and zosyn (Day 2 of 6 week therapy) -TEE in AM, NPO after midnight  Acute Respiratory Failure in setting of pulmonary edema - Improved. Etiology possibly due to acute on chronic diastolic CHF. -Oxygen supplementation to keep SpO2 >92% -NIMV if needed -Albuterol nebulizer Q 6 hr PRN -Continue IV antibiotics to treat for possible HCAP/aspiration PNA -Incentive spirometry  Insulin-dependent Type II DM - Last A1c of >14 on 2/15. Pt at home on metformin 1000 BID,  Lantus 70 U, and Novolog 27 U TID.  -F/U A1c  -Increase Lantus 35 U daily to 40 U daily -Resistant SSI with meals & bedtime -Monitor CBG    Normocytic anemia - Hg stable below baseline 10 with no active bleeding or hemodynamic instability.  -Transfuse if Hg<7 -Monitor CBC  Hyponatremia - Improved. Na 129.  Etiology likely due to hypovolemia.  -NS bolus as needed  -Monitor BMP -Encourage adequate hydration   Anion Gap  Metabolic Acidosis - AG 16. Most likely due to severe sepsis.  -Monitor BMP  Grade 1 Diastolic Chronic Heart Failure - Possibly mild exacerbation on admission. Last 2D-echo on 03/28/13 with EF 55-60% and grade 1 dysfunction. Pro-BNP 2639 on admission. -Monitor volume status -Daily weight and strict I & O's  Hyperlipidemia - Last lipid panel on 05/12/11 with cholesterol 206, TG 100, HDL 40, LDL 146. Pt at home on crestor 5 mg daily. -Atorvastatin 10 mg daily   AKI - Resolved. Cr near baseline of 1. Etiology most likely pre-renal azotemia with Fe Urea 51.7 %.  -Monitor BMP   Diet: Carb modified, NPO after midnight DVT Ppx: SQ Heparin TID  Code: Full  Dispo: Disposition is deferred at this time, awaiting improvement of current medical problems.    The patient does have a current PCP (Neema Bobbie Stack, MD) and does need an [facility] hospital follow-up appointment after discharge.  The patient does not have transportation limitations that hinder transportation to clinic appointments.  .Services Needed at time of discharge: Y = Yes, Blank = No PT:  Yes  OT:   RN:   Equipment:  3-in-1; tub bench  Other:     LOS: 2 days   Juluis Mire, MD 05/12/2013, 9:05 AM

## 2013-05-12 NOTE — Consult Note (Addendum)
[facility]     Reason for Consult: Staph aureus bacteremia    Referring Physician: Dr. Lynnae January  Principal Problem:   Sepsis Active Problems:   Diabetes mellitus type 2, uncontrolled, with complications   HYPERTENSION   Hyponatremia   Osteomyelitis of right foot   Acute respiratory failure   Hyperglycemia   . aspirin EC  325 mg Oral Daily  . atorvastatin  10 mg Oral q1800  .  ceFAZolin (ANCEF) IV  1 g Intravenous Q8H  . gabapentin  600 mg Oral TID  . heparin  5,000 Units Subcutaneous 3 times per day  . insulin aspart  0-20 Units Subcutaneous TID WC  . insulin aspart  0-5 Units Subcutaneous QHS  . insulin glargine  40 Units Subcutaneous QHS  . sodium chloride  3 mL Intravenous Q12H  . vancomycin  1,000 mg Intravenous Q12H    Recommendations: Vancomycin and cefazolin pending sensitivity of both Repeat blood cultures to assure clearance (done) I would treat for 6 weeks for both organisms due to the significant infection TTE    Assessment: He has Staph aureus and Proteus in blood from wound infection, osteo with necrosis requiring amputation.    Antibiotics: Vancomycin and zosyn  HPI: [name] is a 53 y.o. male with PAD, PVD with stents who recently injured his toe and developed osteomyelitis with necrosis that required amputation of 1st and 2nd toe.  His blood cultures have now grown out Staph aureus as well as Proteus.  He feels much better than presentation and has been afebrile > 24 hours.  No current chills.     Review of Systems: A comprehensive review of systems was negative.  Past Medical History  Diagnosis Date  . Hyperlipidemia   . Hypertension   . Osteomyelitis 2010    left foot, s/p midfoot amputation  . 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 Neuromuscular disorder     diabetic neruopathy  . PAD (peripheral artery disease)     ABIs 11/30/11: L ABI 0.68, R ABI 0.84  . Pneumonia  2010  . Critical lower limb ischemia, lt with ABI of 0.60 12/31/2011  . PVD (peripheral vascular disease) 12/31/2011  . S/P angioplasty with stent, 12/30/11, of Lt SFA, Post. tibialis and PTA of L. ant and post. tibial vessels 12/31/2011  . Type II diabetes mellitus ~ 2002  . GERD (gastroesophageal reflux disease)   . CHF (congestive heart failure)     History  Substance Use Topics  . Smoking status: Former Smoker -- 0.25 packs/day for 24 years    Types: Cigarettes    Quit date: 04/15/2005  . Smokeless tobacco: Never Used  . Alcohol Use: Yes     Comment: 06/01/2012 "~ once/month I have 2-3 mixed drinks"    Family History  Problem Relation Age of Onset  . Diabetes Mother   . Hypertension Brother   . Hypertension Sister   . Anesthesia problems Neg Hx    No Known Allergies  OBJECTIVE: Blood pressure 121/64, pulse 91, temperature 97.9 F (36.6 C), temperature source Oral, resp. rate 22, height 6\' 3"  (1.905 m), weight 184 lb 1.4 oz (83.5 kg), SpO2 100.00%. General: awake, alert, nad Skin: no rashes Lungs: CTA B Cor: RRR without m Abdomen: soft, nt, nd Ext: no edema  Microbiology: Recent Results (from the past 240 hour(s))  URINE CULTURE     Status: None   Collection Time  05/10/13  7:16 PM      Result Value Ref Range Status   Specimen Description URINE, RANDOM   Final   Special Requests NONE   Final   Culture  Setup Time     Final   Value: 05/11/2013 02:42     Performed at [facility]     Final   Value: NO GROWTH     Performed at [facility]   Culture     Final   Value: NO GROWTH     Performed at [facility]   Report Status 05/11/2013 FINAL   Final  CULTURE, BLOOD (ROUTINE X 2)     Status: None   Collection Time    05/10/13 11:24 PM      Result Value Ref Range Status   Specimen Description BLOOD RIGHT FOREARM   Final   Special Requests BOTTLES DRAWN AEROBIC AND ANAEROBIC 10ML   Final   Culture  Setup Time     Final    Value: 05/11/2013 03:52     Performed at [facility]   Culture     Final   Value: PROTEUS MIRABILIS     GRAM POSITIVE COCCI IN PAIRS AND CHAINS     STAPHYLOCOCCUS AUREUS     GRAM POSITIVE COCCI IN CLUSTERS     Note: Gram Stain Report Called to,Read Back By and Verified With: DARA ANDERSON ON 05/11/2013 AT 6:32P BY WILEJ   Report Status PENDING   Incomplete  CULTURE, BLOOD (ROUTINE X 2)     Status: None   Collection Time    05/10/13 11:27 PM      Result Value Ref Range Status   Specimen Description BLOOD RIGHT FOREARM   Final   Special Requests BOTTLES DRAWN AEROBIC AND ANAEROBIC 10ML   Final   Culture  Setup Time     Final   Value: 05/11/2013 03:51     Performed at [facility]   Culture     Final   Value: Lonell Grandchild NEGATIVE RODS     GRAM POSITIVE COCCI IN PAIRS AND CHAINS     GRAM POSITIVE COCCI IN CLUSTERS     Note: Gram Stain Report Called to,Read Back By and Verified With: DARA ANDERSON ON 05/11/2013 AT 6:32P BY WILEJ     Performed at [facility]   Report Status PENDING   Incomplete  MRSA PCR SCREENING     Status: None   Collection Time    05/11/13  1:45 AM      Result Value Ref Range Status   MRSA by PCR NEGATIVE  NEGATIVE Final   Comment:            The GeneXpert MRSA Assay (FDA     approved for NASAL specimens     only), is one component of a     comprehensive MRSA colonization     surveillance program. It is not     intended to diagnose MRSA     infection nor to guide or     monitor treatment for     MRSA infections.    Thayer Headings, MD [facility] [email or web address] [phone] pager  [phone] cell 05/12/2013, 3:09 PM

## 2013-05-12 NOTE — Progress Notes (Signed)
ANTIBIOTIC CONSULT NOTE - INITIAL  Pharmacy Consult for Vancomycin and Cefazolin  Indication: rule out sepsis  No Known Allergies  Patient Measurements: Height: 6\' 3"  (190.5 cm) Weight: 184 lb 1.4 oz (83.5 kg) IBW/kg (Calculated) : 84.5  Vital Signs: Temp: 97.9 F (36.6 C) (05/09 1235) Temp src: Oral (05/09 1235) BP: 121/64 mmHg (05/09 1235) Pulse Rate: 91 (05/09 1235)  Labs:  Recent Labs  05/11/13 0349 05/11/13 0401  05/11/13 1720 05/11/13 2300 05/11/13 2351 05/12/13 0349 05/12/13 0905  WBC 17.2*  --   --   --   --  14.4* 14.0*  --   HGB 8.2*  --   --   --   --  7.9* 8.0*  --   PLT 284  --   --   --   --  286 268  --   LABCREA  --  34.87  --   --   --   --   --   --   CREATININE  --   --   < > 1.04 0.98  --   --  0.97  < > = values in this interval not displayed. Estimated Creatinine Clearance: 104 ml/min (by C-G formula based on Cr of 0.97).  Microbiology: Recent Results (from the past 720 hour(s))  URINE CULTURE     Status: None   Collection Time    05/10/13  7:16 PM      Result Value Ref Range Status   Specimen Description URINE, RANDOM   Final   Special Requests NONE   Final   Culture  Setup Time     Final   Value: 05/11/2013 02:42     Performed at [facility]     Final   Value: NO GROWTH     Performed at [facility]   Culture     Final   Value: NO GROWTH     Performed at [facility]   Report Status 05/11/2013 FINAL   Final  CULTURE, BLOOD (ROUTINE X 2)     Status: None   Collection Time    05/10/13 11:24 PM      Result Value Ref Range Status   Specimen Description BLOOD RIGHT FOREARM   Final   Special Requests BOTTLES DRAWN AEROBIC AND ANAEROBIC 10ML   Final   Culture  Setup Time     Final   Value: 05/11/2013 03:52     Performed at [facility]   Culture     Final   Value: PROTEUS MIRABILIS     GRAM POSITIVE COCCI IN PAIRS AND CHAINS     STAPHYLOCOCCUS AUREUS     GRAM POSITIVE COCCI IN  CLUSTERS     Note: Gram Stain Report Called to,Read Back By and Verified With: DARA ANDERSON ON 05/11/2013 AT 6:32P BY WILEJ   Report Status PENDING   Incomplete  CULTURE, BLOOD (ROUTINE X 2)     Status: None   Collection Time    05/10/13 11:27 PM      Result Value Ref Range Status   Specimen Description BLOOD RIGHT FOREARM   Final   Special Requests BOTTLES DRAWN AEROBIC AND ANAEROBIC 10ML   Final   Culture  Setup Time     Final   Value: 05/11/2013 03:51     Performed at [facility]   Culture     Final   Value: [place]  IN PAIRS AND CHAINS     GRAM POSITIVE COCCI IN CLUSTERS     Note: Gram Stain Report Called to,Read Back By and Verified With: DARA ANDERSON ON 05/11/2013 AT 6:32P BY WILEJ     Performed at [facility]   Report Status PENDING   Incomplete  MRSA PCR SCREENING     Status: None   Collection Time    05/11/13  1:45 AM      Result Value Ref Range Status   MRSA by PCR NEGATIVE  NEGATIVE Final   Comment:            The GeneXpert MRSA Assay (FDA     approved for NASAL specimens     only), is one component of a     comprehensive MRSA colonization     surveillance program. It is not     intended to diagnose MRSA     infection nor to guide or     monitor treatment for     MRSA infections.     Medical History: Past Medical History  Diagnosis Date  . Hyperlipidemia   . Hypertension   . Osteomyelitis 2010    left foot, s/p midfoot amputation  . 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 Neuromuscular disorder     diabetic neruopathy  . PAD (peripheral artery disease)     ABIs 11/30/11: L ABI 0.68, R ABI 0.84  . Pneumonia 2010  . Critical lower limb ischemia, lt with ABI of 0.60 12/31/2011  . PVD (peripheral vascular disease) 12/31/2011  . S/P angioplasty with stent, 12/30/11, of Lt SFA, Post. tibialis and PTA of L. ant and post. tibial vessels 12/31/2011  . Type II diabetes mellitus  ~ 2002  . GERD (gastroesophageal reflux disease)   . CHF (congestive heart failure)     Assessment: 53yo male was at pre-admission testing for same-day surgery (toe amputation 2/2 diabetic ulcer) and c/o anorexia, N/V, light-headedness, and rhinorrhea x7d, anesthesia PA recommended sending pt to ED for evaulation, admitted for concern for sepsis, to begin IV ABX. Patient was started on Vancomycin and Zosyn for empiric coverage. Now stopping zosyn and adding cefazolin for osteomyelitis of the great toe on MRI. WBC 14, CrCl > 100 mL/min. Afebrile.   Cultures:  5/7 UCx> ng 5/7 Bcx x2> Proteus mirables, Staph aureusi 5/9 BCx>>    Goal of Therapy:  Vancomycin trough level 15-20 mcg/ml  Plan:  -Vanc 1g q12h - f/u VT @ 2130 since patient is growing staph in blood x2 - Cefazolin 1 gm IV Q 8 hours  - Stop Zosyn  -f/u LOT (day 2 of 6wk)    Albertina Parr, PharmD.  Clinical Pharmacist Pager [phone]

## 2013-05-12 NOTE — Progress Notes (Signed)
ANTIBIOTIC CONSULT NOTE - FOLLOW UP  Pharmacy Consult for vancomycin Indication: bacteremia  Labs:  Recent Labs  05/11/13 0349 05/11/13 0401  05/11/13 1720 05/11/13 2300 05/11/13 2351 05/12/13 0349 05/12/13 0905  WBC 17.2*  --   --   --   --  14.4* 14.0*  --   HGB 8.2*  --   --   --   --  7.9* 8.0*  --   PLT 284  --   --   --   --  286 268  --   LABCREA  --  34.87  --   --   --   --   --   --   CREATININE  --   --   < > 1.04 0.98  --   --  0.97  < > = values in this interval not displayed. Estimated Creatinine Clearance: 104 ml/min (by C-G formula based on Cr of 0.97).  Recent Labs  05/12/13 2130  [place] 11.2      Assessment: 53yo male subtherapeutic on vancomycin for bacteremia w/ GPC in clusters/pairs/chains.  Goal of Therapy:  Vancomycin trough level 15-20 mcg/ml  Plan:  Will change vancomycin to 1500mg  IV Q12H for calculated trough ~18 and continue to monitor.  Wynona Neat, PharmD, BCPS  05/12/2013,11:01 PM

## 2013-05-12 NOTE — Progress Notes (Addendum)
Subjective:  Patient reports pain as mild.    Objective:   VITALS:   Filed Vitals:   05/11/13 2012 05/12/13 0012 05/12/13 0452 05/12/13 0800  BP: 116/64 124/67 101/46 124/67  Pulse: 90 88 95 92  Temp: 97.5 F (36.4 C) 97.6 F (36.4 C) 97.8 F (36.6 C) 98.2 F (36.8 C)  TempSrc: Oral Oral Oral Oral  Resp: 21 25 23 24   Height:      Weight:      SpO2: 100% 100% 100%     Neurologically intact Neurovascular intact Sensation intact distally Intact pulses distally Dorsiflexion/Plantar flexion intact Incision: dressing C/D/I and no drainage No cellulitis present Compartment soft   Lab Results  Component Value Date   WBC 14.0* 05/12/2013   HGB 8.0* 05/12/2013   HCT 24.1* 05/12/2013   MCV 84.6 05/12/2013   PLT 268 05/12/2013     Assessment/Plan:  1 Day Post-Op   - Expected postop acute blood loss anemia - will monitor for symptoms - Up with PT/OT - DVT ppx - SCDs, ambulation, asa - NWB right and lower extremity  Problem List Items Addressed This Visit     Cardiovascular and Mediastinum   HYPERTENSION (Chronic)   Relevant Medications      furosemide (LASIX) injection 60 mg (Completed)      aspirin EC tablet 325 mg      atorvastatin (LIPITOR) tablet 10 mg      heparin injection 5,000 Units   PVD (peripheral vascular disease) (Chronic)   Relevant Medications      furosemide (LASIX) injection 60 mg (Completed)      aspirin EC tablet 325 mg      atorvastatin (LIPITOR) tablet 10 mg      heparin injection 5,000 Units   CHF (congestive heart failure)   Relevant Medications      furosemide (LASIX) injection 60 mg (Completed)      aspirin EC tablet 325 mg      atorvastatin (LIPITOR) tablet 10 mg      heparin injection 5,000 Units     Endocrine   Diabetes mellitus type 2, uncontrolled, with complications   Relevant Medications      aspirin EC tablet 325 mg      atorvastatin (LIPITOR) tablet 10 mg      insulin aspart (novoLOG) injection 0-5 Units      insulin  aspart (novoLOG) injection 15 Units (Completed)      insulin aspart (novoLOG) injection 0-20 Units      insulin glargine (LANTUS) injection 35 Units      insulin aspart (novoLOG) injection 14 Units (Completed)   Ulcer of right great toe due to diabetes mellitus   Relevant Medications      aspirin EC tablet 325 mg      atorvastatin (LIPITOR) tablet 10 mg      insulin aspart (novoLOG) injection 0-5 Units      insulin aspart (novoLOG) injection 15 Units (Completed)      insulin aspart (novoLOG) injection 0-20 Units      insulin glargine (LANTUS) injection 35 Units      insulin aspart (novoLOG) injection 14 Units (Completed)   Type 2 diabetes mellitus with right diabetic foot ulcer   Relevant Medications      aspirin EC tablet 325 mg      atorvastatin (LIPITOR) tablet 10 mg      insulin aspart (novoLOG) injection 0-5 Units      insulin aspart (novoLOG) injection 15 Units (Completed)  insulin aspart (novoLOG) injection 0-20 Units      insulin glargine (LANTUS) injection 35 Units      insulin aspart (novoLOG) injection 14 Units (Completed)     Musculoskeletal and Integument   Foot osteomyelitis, left (Chronic)   Relevant Medications      vancomycin (VANCOCIN) IVPB 1000 mg/200 mL premix   Osteomyelitis of right foot   Relevant Medications      vancomycin (VANCOCIN) IVPB 1000 mg/200 mL premix     Other   Diabetic foot ulcer, bilateral   Relevant Medications      aspirin EC tablet 325 mg      atorvastatin (LIPITOR) tablet 10 mg      insulin aspart (novoLOG) injection 0-5 Units      insulin aspart (novoLOG) injection 15 Units (Completed)      insulin aspart (novoLOG) injection 0-20 Units      insulin glargine (LANTUS) injection 35 Units      insulin aspart (novoLOG) injection 14 Units (Completed)   Hyponatremia - Primary   *Sepsis   Relevant Medications      vancomycin (VANCOCIN) IVPB 1000 mg/200 mL premix   Hyperglycemia    Other Visit Diagnoses   Emesis, persistent             [name] 05/12/2013, 10:32 AM [phone]

## 2013-05-13 DIAGNOSIS — E1165 Type 2 diabetes mellitus with hyperglycemia: Secondary | ICD-10-CM

## 2013-05-13 DIAGNOSIS — A419 Sepsis, unspecified organism: Secondary | ICD-10-CM

## 2013-05-13 DIAGNOSIS — IMO0001 Reserved for inherently not codable concepts without codable children: Secondary | ICD-10-CM

## 2013-05-13 LAB — BASIC METABOLIC PANEL
BUN: 15 mg/dL (ref 6–23)
CALCIUM: 8.1 mg/dL — AB (ref 8.4–10.5)
CO2: 24 mEq/L (ref 19–32)
Chloride: 95 mEq/L — ABNORMAL LOW (ref 96–112)
Creatinine, Ser: 1.27 mg/dL (ref 0.50–1.35)
GFR, EST AFRICAN AMERICAN: 73 mL/min — AB (ref >90)
GFR, EST NON AFRICAN AMERICAN: 63 mL/min — AB (ref >90)
Glucose, Bld: 308 mg/dL — ABNORMAL HIGH (ref 70–99)
Potassium: 4.2 mEq/L (ref 3.7–5.3)
Sodium: 129 mEq/L — ABNORMAL LOW (ref 137–147)

## 2013-05-13 LAB — CBC WITH DIFFERENTIAL/PLATELET
BASOS ABS: 0 10*3/uL (ref 0.0–0.1)
Basophils Relative: 0 % (ref 0–1)
EOS ABS: 0 10*3/uL (ref 0.0–0.7)
Eosinophils Relative: 0 % (ref 0–5)
HCT: 24.8 % — ABNORMAL LOW (ref 39.0–52.0)
Hemoglobin: 8.1 g/dL — ABNORMAL LOW (ref 13.0–17.0)
Lymphocytes Relative: 16 % (ref 12–46)
Lymphs Abs: 1.9 10*3/uL (ref 0.7–4.0)
MCH: 27.7 pg (ref 26.0–34.0)
MCHC: 32.7 g/dL (ref 30.0–36.0)
MCV: 84.9 fL (ref 78.0–100.0)
Monocytes Absolute: 1.1 10*3/uL — ABNORMAL HIGH (ref 0.1–1.0)
Monocytes Relative: 9 % (ref 3–12)
Neutro Abs: 9 10*3/uL — ABNORMAL HIGH (ref 1.7–7.7)
Neutrophils Relative %: 75 % (ref 43–77)
PLATELETS: 337 10*3/uL (ref 150–400)
RBC: 2.92 MIL/uL — ABNORMAL LOW (ref 4.22–5.81)
RDW: 14.3 % (ref 11.5–15.5)
WBC: 12 10*3/uL — AB (ref 4.0–10.5)

## 2013-05-13 LAB — GLUCOSE, CAPILLARY
GLUCOSE-CAPILLARY: 274 mg/dL — AB (ref 70–99)
GLUCOSE-CAPILLARY: 310 mg/dL — AB (ref 70–99)
GLUCOSE-CAPILLARY: 351 mg/dL — AB (ref 70–99)
GLUCOSE-CAPILLARY: 375 mg/dL — AB (ref 70–99)
Glucose-Capillary: 262 mg/dL — ABNORMAL HIGH (ref 70–99)
Glucose-Capillary: 184 mg/dL — ABNORMAL HIGH (ref 70–99)

## 2013-05-13 MED ORDER — SODIUM CHLORIDE 0.9 % IJ SOLN
10.0000 mL | INTRAMUSCULAR | Status: DC | PRN
  Administered 2013-05-14 – 2013-05-17 (×3): 10 mL
  Administered 2013-05-20: 20 mL
  Administered 2013-05-20 – 2013-05-21 (×3): 10 mL

## 2013-05-13 MED ORDER — DM-GUAIFENESIN ER 30-600 MG PO TB12
1.0000 | ORAL_TABLET | Freq: Two times a day (BID) | ORAL | Status: DC
  Administered 2013-05-14 – 2013-05-21 (×16): 1 via ORAL
  Filled 2013-05-13 (×17): qty 1

## 2013-05-13 MED ORDER — INSULIN GLARGINE 100 UNIT/ML ~~LOC~~ SOLN
50.0000 [IU] | Freq: Every day | SUBCUTANEOUS | Status: DC
  Administered 2013-05-13: 50 [IU] via SUBCUTANEOUS
  Filled 2013-05-13 (×3): qty 0.5

## 2013-05-13 MED ORDER — SODIUM CHLORIDE 0.9 % IJ SOLN
10.0000 mL | Freq: Two times a day (BID) | INTRAMUSCULAR | Status: DC
  Administered 2013-05-14 – 2013-05-18 (×2): 10 mL

## 2013-05-13 MED ORDER — FUROSEMIDE 20 MG PO TABS
20.0000 mg | ORAL_TABLET | Freq: Two times a day (BID) | ORAL | Status: DC
  Administered 2013-05-13 – 2013-05-20 (×15): 20 mg via ORAL
  Filled 2013-05-13 (×18): qty 1

## 2013-05-13 NOTE — Progress Notes (Signed)
Patient requesting medication for chest congestion.  MD notified.

## 2013-05-13 NOTE — Progress Notes (Signed)
Peripherally Inserted Central Catheter/Midline Placement  The IV Nurse has discussed with the patient and/or persons authorized to consent for the patient, the purpose of this procedure and the potential benefits and risks involved with this procedure.  The benefits include less needle sticks, lab draws from the catheter and patient may be discharged home with the catheter.  Risks include, but not limited to, infection, bleeding, blood clot (thrombus formation), and puncture of an artery; nerve damage and irregular heat beat.  Alternatives to this procedure were also discussed.  PICC/Midline Placement Documentation  PICC / Midline Single Lumen [id number] PICC Right Basilic 41 cm 1 cm (Active)  Indication for Insertion or Continuance of Line Home intravenous therapies (PICC only) 05/13/2013  7:01 PM  Exposed Catheter (cm) 1 cm 05/13/2013  7:01 PM  Line Status Flushed;Saline locked;Blood return noted 05/13/2013  7:01 PM  Dressing Change Due 05/20/13 05/13/2013  7:01 PM       [facility] Payment 05/13/2013, 7:10 PM

## 2013-05-13 NOTE — Progress Notes (Signed)
Pt transferred to new room.  Pt alert and orient during transfer, pt vital signs documented prior to transfer.  Pt belongings carried with pt.

## 2013-05-13 NOTE — Progress Notes (Signed)
Subjective: Decreased sleep last night so sluggish this am.  His hands were numb but better and his hands were swollen but decreased since admission. Foot pain is 2/10  Objective: Vital signs in last 24 hours: Filed Vitals:   05/13/13 0200 05/13/13 0400 05/13/13 0600 05/13/13 0800  BP: 134/75 97/58 103/63 107/69  Pulse: 114 107 97 94  Temp: 100.5 F (38.1 C)   98.8 F (37.1 C)  TempSrc: Oral Oral  Oral  Resp: 17 26 14 24   Height:      Weight:      SpO2: 100% 95% 100% 100%   Weight change:   Intake/Output Summary (Last 24 hours) at 05/13/13 0959 Last data filed at 05/13/13 0914  Gross per 24 hour  Intake   1214 ml  Output   2575 ml  Net  -1361 ml   Vitals reviewed. General: resting in bed but arousable, NAD HEENT: [place]/at, no scleral icterus Cardiac: RRR, no rubs, murmurs or gallops Pulm: clear to auscultation bilaterally, no wheezes, rales, or rhonchi Abd: soft, nontender, nondistended, BS present Ext: warm and well perfused, no pedal edema. Left plantar foot with callus, right foot with ACE wrap s/p amputation  Neuro: alert and oriented X3, moving all 4 extremities   Lab Results: Basic Metabolic Panel:  Recent Labs Lab 05/12/13 0905 05/13/13 0545  NA 129* 129*  K 4.1 4.2  CL 91* 95*  CO2 22 24  GLUCOSE 371* 308*  BUN 16 15  CREATININE 0.97 1.27  CALCIUM 8.0* 8.1*   Liver Function Tests:  Recent Labs Lab 05/10/13 1322 05/10/13 1750  AST 52* 44*  ALT 48 46  ALKPHOS 214* 199*  BILITOT 1.3* 1.1  PROT 7.7 7.3  ALBUMIN 2.5* 2.3*    Recent Labs Lab 05/10/13 1322  LIPASE 11   CBC:  Recent Labs Lab 05/12/13 0349 05/13/13 0545  WBC 14.0* 12.0*  NEUTROABS 11.9* 9.0*  HGB 8.0* 8.1*  HCT 24.1* 24.8*  MCV 84.6 84.9  PLT 268 337   BNP:  Recent Labs Lab 05/10/13 2324  PROBNP 2639.0*   CBG:  Recent Labs Lab 05/12/13 0842 05/12/13 1224 05/12/13 1626 05/12/13 2130 05/13/13 0705 05/13/13 0839  GLUCAP 355* 399* 380* 306* 274* 262*    Hemoglobin A1C:  Recent Labs Lab 05/12/13 0349  HGBA1C 11.5*   Thyroid Function Tests:  Recent Labs Lab 05/11/13 0349  TSH 0.869   Urine Drug Screen: Drugs of Abuse     Component Value Date/Time   LABOPIA POSITIVE* 03/20/2009 0455   COCAINSCRNUR NONE DETECTED 03/20/2009 0455   LABBENZ NONE DETECTED 03/20/2009 0455   AMPHETMU NONE DETECTED 03/20/2009 0455   THCU NONE DETECTED 03/20/2009 0455   LABBARB  Value: NONE DETECTED        DRUG SCREEN FOR MEDICAL PURPOSES ONLY.  IF CONFIRMATION IS NEEDED FOR ANY PURPOSE, NOTIFY LAB WITHIN 5 DAYS.        LOWEST DETECTABLE LIMITS FOR URINE DRUG SCREEN Drug Class       Cutoff (ng/mL) Amphetamine      1000 Barbiturate      200 Benzodiazepine   [id number] Tricyclics       [id number] Opiates          300 Cocaine          300 THC              50 03/20/2009 0455    Urinalysis:  Recent Labs Lab 05/10/13 1916  COLORURINE YELLOW  LABSPEC 1.019  PHURINE 5.5  GLUCOSEU >1000*  HGBUR LARGE*  BILIRUBINUR NEGATIVE  KETONESUR 15*  PROTEINUR >300*  UROBILINOGEN 0.2  NITRITE NEGATIVE  LEUKOCYTESUR NEGATIVE   Misc. Labs: Repeat blood cultures   Micro Results: Recent Results (from the past 240 hour(s))  URINE CULTURE     Status: None   Collection Time    05/10/13  7:16 PM      Result Value Ref Range Status   Specimen Description URINE, RANDOM   Final   Special Requests NONE   Final   Culture  Setup Time     Final   Value: 05/11/2013 02:42     Performed at [place]     Final   Value: NO GROWTH     Performed at [facility]   Culture     Final   Value: NO GROWTH     Performed at [facility]   Report Status 05/11/2013 FINAL   Final  CULTURE, BLOOD (ROUTINE X 2)     Status: None   Collection Time    05/10/13 11:24 PM      Result Value Ref Range Status   Specimen Description BLOOD RIGHT FOREARM   Final   Special Requests BOTTLES DRAWN AEROBIC AND ANAEROBIC 10ML   Final   Culture  Setup Time     Final    Value: 05/11/2013 03:52     Performed at [facility]   Culture     Final   Value: PROTEUS MIRABILIS     GRAM POSITIVE COCCI IN PAIRS AND CHAINS     STAPHYLOCOCCUS AUREUS     GRAM POSITIVE COCCI IN CLUSTERS     Note: Gram Stain Report Called to,Read Back By and Verified With: DARA ANDERSON ON 05/11/2013 AT 6:32P BY WILEJ   Report Status PENDING   Incomplete  CULTURE, BLOOD (ROUTINE X 2)     Status: None   Collection Time    05/10/13 11:27 PM      Result Value Ref Range Status   Specimen Description BLOOD RIGHT FOREARM   Final   Special Requests BOTTLES DRAWN AEROBIC AND ANAEROBIC 10ML   Final   Culture  Setup Time     Final   Value: 05/11/2013 03:51     Performed at [facility]   Culture     Final   Value: Lonell Grandchild NEGATIVE RODS     GRAM POSITIVE COCCI IN PAIRS AND CHAINS     GRAM POSITIVE COCCI IN CLUSTERS     Note: Gram Stain Report Called to,Read Back By and Verified With: DARA ANDERSON ON 05/11/2013 AT 6:32P BY WILEJ     Performed at [facility]   Report Status PENDING   Incomplete  MRSA PCR SCREENING     Status: None   Collection Time    05/11/13  1:45 AM      Result Value Ref Range Status   MRSA by PCR NEGATIVE  NEGATIVE Final   Comment:            The GeneXpert MRSA Assay (FDA     approved for NASAL specimens     only), is one component of a     comprehensive MRSA colonization     surveillance program. It is not     intended to diagnose MRSA     infection nor to guide or     monitor treatment for     MRSA infections.  CULTURE,  BLOOD (ROUTINE X 2)     Status: None   Collection Time    05/12/13  1:15 PM      Result Value Ref Range Status   Specimen Description BLOOD LEFT HAND   Final   Special Requests BOTTLES DRAWN AEROBIC AND ANAEROBIC 10CC   Final   Culture  Setup Time     Final   Value: 05/12/2013 17:38     Performed at [facility]   Culture     Final   Value:        BLOOD CULTURE RECEIVED NO GROWTH TO DATE CULTURE WILL BE HELD FOR  5 DAYS BEFORE ISSUING A FINAL NEGATIVE REPORT     Performed at [facility]   Report Status PENDING   Incomplete   Studies/Results: Dg Chest 2 View  05/11/2013   CLINICAL DATA:  dyspnea  EXAM: CHEST  2 VIEW  COMPARISON:  DG CHEST 1V PORT dated 05/10/2013  FINDINGS: Low lung volumes. Cardiac silhouette is enlarged. The area of increased density within the right hemithorax has improved in the interim consistent with improved pulmonary edema. There is mild residual density right lung base. No new focal regions of consolidation and focal infiltrates. No acute osseous abnormalities.  IMPRESSION: Resolving asymmetric edema on the right. No acute cardiopulmonary disease.   Electronically Signed   By: Margaree Mackintosh M.D.   On: 05/11/2013 20:06   Medications: Scheduled Meds: . aspirin EC  325 mg Oral Daily  . atorvastatin  10 mg Oral q1800  .  ceFAZolin (ANCEF) IV  1 g Intravenous Q8H  . furosemide  20 mg Oral BID  . gabapentin  600 mg Oral TID  . heparin  5,000 Units Subcutaneous 3 times per day  . insulin aspart  0-20 Units Subcutaneous TID WC  . insulin aspart  0-5 Units Subcutaneous QHS  . insulin glargine  40 Units Subcutaneous QHS  . sodium chloride  3 mL Intravenous Q12H  . 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  He presented with sepsis 2/2 right foot osteomyelitis s/p amputation 05/11/13 and acute respiratory failure (improved).    #Sepsis 2/2 right foot osteomyelitis  -improved after s/p amputation of right great toe and second toe on 5/8 -ortho following -ON Cefazolin and Vancomycin, prn Percocet  -pending PT/OT -will  likely transfer to med surg later today   #Bactermia (Staph aureus and Proteus)  -repeat blood cultures pending -ID consulted will need 6 weeks of antibioitcs. Will need outpatient follow up with ID -will need PICC -TEE in the am   #Diabetes mellitus type 2, uncontrolled (HA1C 11.5) -still hyperglycemic. Lantus 40 units will likely increase to 50 units as home dose is 70 units qhs, SSI R, qhs   #HYPERTENSION -normotensive  -will continue to monitor on Coreg, Lasix, statin  #F/E/N -NSL -carb mod diet   #DVT px  -Heparin sq  Dispo: Disposition is deferred at this time, awaiting improvement of current medical problems.  Anticipated discharge in approximately 2-3 day(s).   The patient does have a current PCP (Neema Bobbie Stack, MD) and does need an [facility] hospital follow-up appointment after discharge.  The patient does not have transportation limitations that hinder transportation to  clinic appointments.  .Services Needed at time of discharge: Y = Yes, Blank = No PT: Pending   OT: Pending   RN:   Equipment:   Other:     LOS: 3 days   Cresenciano Genre, MD [phone] 05/13/2013, 9:59 AM

## 2013-05-13 NOTE — Progress Notes (Signed)
[place] for Infectious Disease  Date of Admission:  05/10/2013  Antibiotics: Vancomycin day 3 Cefazolin day 2  Subjective: No complaints  Objective: Temp:  [98.2 F (36.8 C)-100.5 F (38.1 C)] 98.9 F (37.2 C) (05/10 1146) Pulse Rate:  [94-114] 94 (05/10 1146) Resp:  [14-29] 19 (05/10 1146) BP: (97-139)/(58-78) 111/66 mmHg (05/10 1146) SpO2:  [95 %-100 %] 100 % (05/10 1146)  General: alert, nad Skin: no rashes Abdomen: soft, nt Ext: no edema, right foot wrapped  Lab Results Lab Results  Component Value Date   WBC 12.0* 05/13/2013   HGB 8.1* 05/13/2013   HCT 24.8* 05/13/2013   MCV 84.9 05/13/2013   PLT 337 05/13/2013    Lab Results  Component Value Date   CREATININE 1.27 05/13/2013   BUN 15 05/13/2013   NA 129* 05/13/2013   K 4.2 05/13/2013   CL 95* 05/13/2013   CO2 24 05/13/2013    Lab Results  Component Value Date   ALT 46 05/10/2013   AST 44* 05/10/2013   ALKPHOS 199* 05/10/2013   BILITOT 1.1 05/10/2013      Microbiology: Recent Results (from the past 240 hour(s))  URINE CULTURE     Status: None   Collection Time    05/10/13  7:16 PM      Result Value Ref Range Status   Specimen Description URINE, RANDOM   Final   Special Requests NONE   Final   Culture  Setup Time     Final   Value: 05/11/2013 02:42     Performed at [facility]     Final   Value: NO GROWTH     Performed at [facility]   Culture     Final   Value: NO GROWTH     Performed at [facility]   Report Status 05/11/2013 FINAL   Final  CULTURE, BLOOD (ROUTINE X 2)     Status: None   Collection Time    05/10/13 11:24 PM      Result Value Ref Range Status   Specimen Description BLOOD RIGHT FOREARM   Final   Special Requests BOTTLES DRAWN AEROBIC AND ANAEROBIC 10ML   Final   Culture  Setup Time     Final   Value: 05/11/2013 03:52     Performed at [place]     Final   Value: PROTEUS MIRABILIS     [place]  AND CHAINS     STAPHYLOCOCCUS AUREUS     Note: Gram Stain Report Called to,Read Back By and Verified With: DARA ANDERSON ON 05/11/2013 AT 6:32P BY WILEJ     Performed at [facility]   Report Status PENDING   Incomplete  CULTURE, BLOOD (ROUTINE X 2)     Status: None   Collection Time    05/10/13 11:27 PM      Result Value Ref Range Status   Specimen Description BLOOD RIGHT FOREARM   Final   Special Requests BOTTLES DRAWN AEROBIC AND ANAEROBIC 10ML   Final   Culture  Setup Time     Final   Value: 05/11/2013 03:51     Performed at [facility]   Culture     Final   Value: PROTEUS MIRABILIS     GRAM POSITIVE COCCI IN PAIRS AND CHAINS     GRAM POSITIVE COCCI IN CLUSTERS     Note: Gram Stain Report Called  to,Read Back By and Verified With: DARA ANDERSON ON 05/11/2013 AT 6:32P BY Dennard Nip     Performed at [facility]   Report Status PENDING   Incomplete  MRSA PCR SCREENING     Status: None   Collection Time    05/11/13  1:45 AM      Result Value Ref Range Status   MRSA by PCR NEGATIVE  NEGATIVE Final   Comment:            The GeneXpert MRSA Assay (FDA     approved for NASAL specimens     only), is one component of a     comprehensive MRSA colonization     surveillance program. It is not     intended to diagnose MRSA     infection nor to guide or     monitor treatment for     MRSA infections.  CULTURE, BLOOD (ROUTINE X 2)     Status: None   Collection Time    05/12/13  1:15 PM      Result Value Ref Range Status   Specimen Description BLOOD LEFT HAND   Final   Special Requests BOTTLES DRAWN AEROBIC AND ANAEROBIC 10CC   Final   Culture  Setup Time     Final   Value: 05/12/2013 17:38     Performed at [facility]   Culture     Final   Value:        BLOOD CULTURE RECEIVED NO GROWTH TO DATE CULTURE WILL BE HELD FOR 5 DAYS BEFORE ISSUING A FINAL NEGATIVE REPORT     Performed at [facility]   Report Status PENDING   Incomplete     Studies/Results: Dg Chest 2 View  05/11/2013   CLINICAL DATA:  dyspnea  EXAM: CHEST  2 VIEW  COMPARISON:  DG CHEST 1V PORT dated 05/10/2013  FINDINGS: Low lung volumes. Cardiac silhouette is enlarged. The area of increased density within the right hemithorax has improved in the interim consistent with improved pulmonary edema. There is mild residual density right lung base. No new focal regions of consolidation and focal infiltrates. No acute osseous abnormalities.  IMPRESSION: Resolving asymmetric edema on the right. No acute cardiopulmonary disease.   Electronically Signed   By: Margaree Mackintosh M.D.   On: 05/11/2013 20:06    Assessment/Plan: 1) osteomyelitis with bacteremia - both Staph aureus and Proteus, 2/2.  Awaiting sensitivity.  Will need 6 weeks antibiotics through IV.  Repeat ngtd from 5/9.   Dr. Johnnye Sima back tomorrow.    Thayer Headings, MD [facility] [email or web address] [phone] pager   [phone] cell 05/13/2013, 12:59 PM

## 2013-05-13 NOTE — Progress Notes (Signed)
Nurse contacted MD Baker Janus to inquire about regarding missing insulin coverage for 1200 hour.  Pt CBG remain >200, with last coverage administered during 0800 hour.  MD informed nurse that she would review medical record and follow up.  Nurse will continue to monitor

## 2013-05-14 ENCOUNTER — Encounter (HOSPITAL_COMMUNITY): Payer: Self-pay | Admitting: Gastroenterology

## 2013-05-14 ENCOUNTER — Encounter (HOSPITAL_COMMUNITY)
Admission: EM | Disposition: A | Discharge status: Home Health | Payer: Medicaid Other | Source: Home / Self Care | Attending: Internal Medicine

## 2013-05-14 DIAGNOSIS — S98139A Complete traumatic amputation of one unspecified lesser toe, initial encounter: Secondary | ICD-10-CM

## 2013-05-14 DIAGNOSIS — B964 Proteus (mirabilis) (morganii) as the cause of diseases classified elsewhere: Secondary | ICD-10-CM

## 2013-05-14 DIAGNOSIS — M869 Osteomyelitis, unspecified: Secondary | ICD-10-CM

## 2013-05-14 DIAGNOSIS — E1142 Type 2 diabetes mellitus with diabetic polyneuropathy: Secondary | ICD-10-CM

## 2013-05-14 DIAGNOSIS — J9600 Acute respiratory failure, unspecified whether with hypoxia or hypercapnia: Secondary | ICD-10-CM

## 2013-05-14 DIAGNOSIS — I10 Essential (primary) hypertension: Secondary | ICD-10-CM

## 2013-05-14 DIAGNOSIS — A4902 Methicillin resistant Staphylococcus aureus infection, unspecified site: Secondary | ICD-10-CM

## 2013-05-14 DIAGNOSIS — R6520 Severe sepsis without septic shock: Secondary | ICD-10-CM

## 2013-05-14 DIAGNOSIS — E785 Hyperlipidemia, unspecified: Secondary | ICD-10-CM

## 2013-05-14 DIAGNOSIS — E1149 Type 2 diabetes mellitus with other diabetic neurological complication: Secondary | ICD-10-CM

## 2013-05-14 DIAGNOSIS — I5032 Chronic diastolic (congestive) heart failure: Secondary | ICD-10-CM

## 2013-05-14 DIAGNOSIS — E119 Type 2 diabetes mellitus without complications: Secondary | ICD-10-CM

## 2013-05-14 DIAGNOSIS — B952 Enterococcus as the cause of diseases classified elsewhere: Secondary | ICD-10-CM

## 2013-05-14 DIAGNOSIS — S98119A Complete traumatic amputation of unspecified great toe, initial encounter: Secondary | ICD-10-CM

## 2013-05-14 DIAGNOSIS — R7881 Bacteremia: Secondary | ICD-10-CM

## 2013-05-14 DIAGNOSIS — I059 Rheumatic mitral valve disease, unspecified: Secondary | ICD-10-CM

## 2013-05-14 DIAGNOSIS — D649 Anemia, unspecified: Secondary | ICD-10-CM

## 2013-05-14 DIAGNOSIS — A419 Sepsis, unspecified organism: Principal | ICD-10-CM

## 2013-05-14 HISTORY — PX: TEE WITHOUT CARDIOVERSION: SHX5443

## 2013-05-14 LAB — GLUCOSE, CAPILLARY
GLUCOSE-CAPILLARY: 260 mg/dL — AB (ref 70–99)
GLUCOSE-CAPILLARY: 274 mg/dL — AB (ref 70–99)
Glucose-Capillary: 179 mg/dL — ABNORMAL HIGH (ref 70–99)
Glucose-Capillary: 204 mg/dL — ABNORMAL HIGH (ref 70–99)

## 2013-05-14 LAB — CBC WITH DIFFERENTIAL/PLATELET
Basophils Absolute: 0 10*3/uL (ref 0.0–0.1)
Basophils Relative: 0 % (ref 0–1)
EOS ABS: 0.1 10*3/uL (ref 0.0–0.7)
Eosinophils Relative: 1 % (ref 0–5)
HCT: 22.2 % — ABNORMAL LOW (ref 39.0–52.0)
Hemoglobin: 7.1 g/dL — ABNORMAL LOW (ref 13.0–17.0)
Lymphocytes Relative: 19 % (ref 12–46)
Lymphs Abs: 2 10*3/uL (ref 0.7–4.0)
MCH: 27.4 pg (ref 26.0–34.0)
MCHC: 32 g/dL (ref 30.0–36.0)
MCV: 85.7 fL (ref 78.0–100.0)
MONO ABS: 1 10*3/uL (ref 0.1–1.0)
Monocytes Relative: 9 % (ref 3–12)
NEUTROS ABS: 7.6 10*3/uL (ref 1.7–7.7)
NEUTROS PCT: 71 % (ref 43–77)
Platelets: 341 10*3/uL (ref 150–400)
RBC: 2.59 MIL/uL — ABNORMAL LOW (ref 4.22–5.81)
RDW: 14.5 % (ref 11.5–15.5)
WBC: 10.7 10*3/uL — ABNORMAL HIGH (ref 4.0–10.5)

## 2013-05-14 LAB — CBC
HEMATOCRIT: 26 % — AB (ref 39.0–52.0)
HEMOGLOBIN: 8.4 g/dL — AB (ref 13.0–17.0)
MCH: 27.7 pg (ref 26.0–34.0)
MCHC: 32.3 g/dL (ref 30.0–36.0)
MCV: 85.8 fL (ref 78.0–100.0)
Platelets: 371 10*3/uL (ref 150–400)
RBC: 3.03 MIL/uL — ABNORMAL LOW (ref 4.22–5.81)
RDW: 14.5 % (ref 11.5–15.5)
WBC: 14.2 10*3/uL — AB (ref 4.0–10.5)

## 2013-05-14 LAB — BASIC METABOLIC PANEL
BUN: 13 mg/dL (ref 6–23)
CO2: 25 mEq/L (ref 19–32)
CREATININE: 1.03 mg/dL (ref 0.50–1.35)
Calcium: 8 mg/dL — ABNORMAL LOW (ref 8.4–10.5)
Chloride: 98 mEq/L (ref 96–112)
GFR calc Af Amer: >90 mL/min (ref >90)
GFR, EST NON AFRICAN AMERICAN: 81 mL/min — AB (ref >90)
GLUCOSE: 300 mg/dL — AB (ref 70–99)
POTASSIUM: 4.2 meq/L (ref 3.7–5.3)
Sodium: 135 mEq/L — ABNORMAL LOW (ref 137–147)

## 2013-05-14 SURGERY — ECHOCARDIOGRAM, TRANSESOPHAGEAL
Anesthesia: Moderate Sedation

## 2013-05-14 MED ORDER — CARVEDILOL 3.125 MG PO TABS
3.1250 mg | ORAL_TABLET | Freq: Two times a day (BID) | ORAL | Status: DC
  Administered 2013-05-14 – 2013-05-21 (×14): 3.125 mg via ORAL
  Filled 2013-05-14 (×17): qty 1

## 2013-05-14 MED ORDER — POLYETHYLENE GLYCOL 3350 17 G PO PACK
17.0000 g | PACK | Freq: Once | ORAL | Status: AC
  Administered 2013-05-14: 17 g via ORAL
  Filled 2013-05-14: qty 1

## 2013-05-14 MED ORDER — MIDAZOLAM HCL 5 MG/ML IJ SOLN
INTRAMUSCULAR | Status: AC
  Filled 2013-05-14: qty 2

## 2013-05-14 MED ORDER — SENNOSIDES-DOCUSATE SODIUM 8.6-50 MG PO TABS
1.0000 | ORAL_TABLET | Freq: Once | ORAL | Status: AC
  Administered 2013-05-14: 1 via ORAL
  Filled 2013-05-14: qty 1

## 2013-05-14 MED ORDER — FENTANYL CITRATE 0.05 MG/ML IJ SOLN
INTRAMUSCULAR | Status: AC
  Filled 2013-05-14: qty 2

## 2013-05-14 MED ORDER — FENTANYL CITRATE 0.05 MG/ML IJ SOLN
INTRAMUSCULAR | Status: DC | PRN
  Administered 2013-05-14 (×3): 25 ug via INTRAVENOUS

## 2013-05-14 MED ORDER — MIDAZOLAM HCL 10 MG/2ML IJ SOLN
INTRAMUSCULAR | Status: DC | PRN
  Administered 2013-05-14: 1 mg via INTRAVENOUS
  Administered 2013-05-14 (×2): 2 mg via INTRAVENOUS

## 2013-05-14 MED ORDER — INSULIN GLARGINE 100 UNIT/ML ~~LOC~~ SOLN
70.0000 [IU] | Freq: Every day | SUBCUTANEOUS | Status: DC
  Administered 2013-05-14 – 2013-05-18 (×5): 70 [IU] via SUBCUTANEOUS
  Filled 2013-05-14 (×6): qty 0.7

## 2013-05-14 MED ORDER — SODIUM CHLORIDE 0.9 % IV SOLN
INTRAVENOUS | Status: DC
  Administered 2013-05-14 – 2013-05-19 (×2): via INTRAVENOUS

## 2013-05-14 MED ORDER — CEFAZOLIN SODIUM-DEXTROSE 2-3 GM-% IV SOLR
2.0000 g | Freq: Three times a day (TID) | INTRAVENOUS | Status: DC
  Administered 2013-05-14 – 2013-05-15 (×3): 2 g via INTRAVENOUS
  Filled 2013-05-14 (×5): qty 50

## 2013-05-14 MED ORDER — SODIUM CHLORIDE 0.9 % IV SOLN
INTRAVENOUS | Status: DC
  Administered 2013-05-14: 15:00:00 via INTRAVENOUS

## 2013-05-14 NOTE — Progress Notes (Signed)
CARE MANAGEMENT NOTE 05/14/2013  Patient:  Cassels,Akash   Account Number:  [id number]  Date Initiated:  05/11/2013  Documentation initiated by:  [facility]  Subjective/Objective Assessment:   sepsis, toe amputation     Action/Plan:   waiting final recommendation for dc   Anticipated DC Date:  05/14/2013   Anticipated DC Plan:  [place]  CM consult      Choice offered to / List presented to:          [facility] arranged  HH-1 RN  HH-2 PT  IV Antibiotics      Status of service:  In process, will continue to follow Medicare Important Message given?   (If response is "NO", the following Medicare IM given date fields will be blank) Date Medicare IM given:   Date Additional Medicare IM given:    Discharge Disposition:    Per UR Regulation:  Reviewed for med. necessity/level of care/duration of stay  If discussed at [place] of Stay Meetings, dates discussed:    Comments:  05/11/2013 1430 UR completed. Jonnie Finner RN CCM Case [facility]gt phone [phone]   05/14/2013  [place], [place] CM referral:  home IV antibiotics x 6 weeks                      home health PT  Met with patient regarding discharge planning for home health services and IV antibiotics. Verified he does not have insurance. He agreed for [facility] to provide home health services.  [facility]/Marie called with referral for home health PT and home IV antibiotics.  She will meet with patient.

## 2013-05-14 NOTE — Progress Notes (Addendum)
Subjective:  Pt seen and examined in AM. No acute events overnight. Pt reports his pain is well controlled. Denies fever, chills, dyspnea, nausea, vomiting, change in BM or urination. Reports he is having normal PO intake.   Objective: Vital signs in last 24 hours: Filed Vitals:   05/13/13 2205 05/14/13 0500 05/14/13 0818 05/14/13 1212  BP: 156/75 134/68 130/73 170/88  Pulse: 108 89 88 89  Temp:  98.5 F (36.9 C) 98.5 F (36.9 C) 97.9 F (36.6 C)  TempSrc:  Oral Oral Oral  Resp: 16 16 18 18   Height: 6\' 3"  (1.905 m)     Weight: 89.037 kg (196 lb 4.7 oz)     SpO2: 97% 99% 92% 99%   Weight change:   Intake/Output Summary (Last 24 hours) at 05/14/13 1325 Last data filed at 05/14/13 1032  Gross per 24 hour  Intake    770 ml  Output   2450 ml  Net  -1680 ml   Physical Exam:  General: NAD Heart: Regular rate and rhythm Lungs: Clear to ausculation b/l. 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 Neuro: A &O x 3  Lab Results: Basic Metabolic Panel:  Recent Labs Lab 05/13/13 0545 05/14/13 0500  NA 129* 135*  K 4.2 4.2  CL 95* 98  CO2 24 25  GLUCOSE 308* 300*  BUN 15 13  CREATININE 1.27 1.03  CALCIUM 8.1* 8.0*   Liver Function Tests:  Recent Labs Lab 05/10/13 1322 05/10/13 1750  AST 52* 44*  ALT 48 46  ALKPHOS 214* 199*  BILITOT 1.3* 1.1  PROT 7.7 7.3  ALBUMIN 2.5* 2.3*    Recent Labs Lab 05/10/13 1322  LIPASE 11   CBC:  Recent Labs Lab 05/13/13 0545 05/14/13 0500  WBC 12.0* 10.7*  NEUTROABS 9.0* 7.6  HGB 8.1* 7.1*  HCT 24.8* 22.2*  MCV 84.9 85.7  PLT 337 341   BNP:  Recent Labs Lab 05/10/13 2324  PROBNP 2639.0*   CBG:  Recent Labs Lab 05/13/13 1145 05/13/13 1750 05/13/13 2201 05/13/13 2234 05/14/13 0815 05/14/13 1209  GLUCAP 184* 310* 351* 375* 260* 274*   Thyroid Function  Tests:  Recent Labs Lab 05/11/13 0349  TSH 0.869    Urinalysis:  Recent Labs Lab 05/10/13 1916  COLORURINE YELLOW  LABSPEC 1.019  PHURINE 5.5  GLUCOSEU >1000*  HGBUR LARGE*  BILIRUBINUR NEGATIVE  KETONESUR 15*  PROTEINUR >300*  UROBILINOGEN 0.2  NITRITE NEGATIVE  LEUKOCYTESUR NEGATIVE     Micro Results: Recent Results (from the past 240 hour(s))  URINE CULTURE     Status: None   Collection Time    05/10/13  7:16 PM      Result Value Ref Range Status   Specimen Description URINE, RANDOM   Final   Special Requests NONE   Final   Culture  Setup Time     Final   Value: 05/11/2013 02:42     Performed at [facility]     Final   Value: NO GROWTH     Performed at [facility]   Culture     Final   Value: NO GROWTH     Performed at [facility]   Report Status 05/11/2013 FINAL   Final  CULTURE, BLOOD (ROUTINE X 2)     Status: None   Collection Time  05/10/13 11:24 PM      Result Value Ref Range Status   Specimen Description BLOOD RIGHT FOREARM   Final   Special Requests BOTTLES DRAWN AEROBIC AND ANAEROBIC 10ML   Final   Culture  Setup Time     Final   Value: 05/11/2013 03:52     Performed at [facility]   Culture     Final   Value: PROTEUS MIRABILIS     ENTEROCOCCUS SPECIES     STAPHYLOCOCCUS AUREUS     Note: Gram Stain Report Called to,Read Back By and Verified With: DARA ANDERSON ON 05/11/2013 AT 6:32P BY WILEJ     Performed at [facility]   Report Status PENDING   Incomplete   Organism ID, Bacteria PROTEUS MIRABILIS   Final  CULTURE, BLOOD (ROUTINE X 2)     Status: None   Collection Time    05/10/13 11:27 PM      Result Value Ref Range Status   Specimen Description BLOOD RIGHT FOREARM   Final   Special Requests BOTTLES DRAWN AEROBIC AND ANAEROBIC 10ML   Final   Culture  Setup Time     Final   Value: 05/11/2013 03:51     Performed at [facility]   Culture     Final   Value: PROTEUS  MIRABILIS     Note: SUSCEPTIBILITIES PERFORMED ON PREVIOUS CULTURE WITHIN THE LAST 5 DAYS.     ENTEROCOCCUS SPECIES     STAPHYLOCOCCUS AUREUS     Note: RIFAMPIN AND GENTAMICIN SHOULD NOT BE USED AS SINGLE DRUGS FOR TREATMENT OF STAPH INFECTIONS.   Report Status PENDING   Incomplete  MRSA PCR SCREENING     Status: None   Collection Time    05/11/13  1:45 AM      Result Value Ref Range Status   MRSA by PCR NEGATIVE  NEGATIVE Final   Comment:            The GeneXpert MRSA Assay (FDA     approved for NASAL specimens     only), is one component of a     comprehensive MRSA colonization     surveillance program. It is not     intended to diagnose MRSA     infection nor to guide or     monitor treatment for     MRSA infections.  CULTURE, BLOOD (ROUTINE X 2)     Status: None   Collection Time    05/12/13  1:15 PM      Result Value Ref Range Status   Specimen Description BLOOD LEFT HAND   Final   Special Requests BOTTLES DRAWN AEROBIC AND ANAEROBIC 10CC   Final   Culture  Setup Time     Final   Value: 05/12/2013 17:38     Performed at [facility]   Culture     Final   Value:        BLOOD CULTURE RECEIVED NO GROWTH TO DATE CULTURE WILL BE HELD FOR 5 DAYS BEFORE ISSUING A FINAL NEGATIVE REPORT     Performed at [facility]   Report Status PENDING   Incomplete   Studies/Results: No results found. Medications: I have reviewed the patient's current medications. Scheduled Meds: . aspirin EC  325 mg Oral Daily  . atorvastatin  10 mg Oral q1800  .  ceFAZolin (ANCEF) IV  2 g Intravenous Q8H  . dextromethorphan-guaiFENesin  1 tablet Oral BID  . furosemide  20  mg Oral BID  . gabapentin  600 mg Oral TID  . heparin  5,000 Units Subcutaneous 3 times per day  . insulin aspart  0-20 Units Subcutaneous TID WC  . insulin aspart  0-5 Units Subcutaneous QHS  . insulin glargine  70 Units Subcutaneous QHS  . polyethylene glycol  17 g Oral Once  . senna-docusate  1 tablet Oral Once   . sodium chloride  10-40 mL Intracatheter Q12H  . sodium chloride  3 mL Intravenous Q12H  . vancomycin  1,500 mg Intravenous Q12H   Continuous Infusions:  PRN Meds:.acetaminophen, albuterol, metoCLOPramide (REGLAN) injection, metoCLOPramide, ondansetron (ZOFRAN) IV, ondansetron, oxyCODONE-acetaminophen, sodium chloride Assessment/Plan:   Severe sepsis due to Osteomyelitis of Right Great Toe s/p amputation of 1st & 2nd Ray amputation on 5/8 - no current sepsis criteria -Appreciate orthopedic surgery following  -Continue IV vancomycin and ancef (Day 4 of 6 week therapy) -Monitor CBC and fever curve  -Percocet/Roxicet 1-2 tbs Q 4hr PRN -Gabapentin 600 mg TID  -PT/OT therapies --> home health PT  -Non-weight bearing to right LE -SCD's & aspirin 325 mg daily for DVT Ppx  Proteus Mirabilis, MRSA,  & Enterococcus Bacteremia - Pt with blood cultures (2/2) from 5/7 with Proteus Mirabilis, Staph Aureus, and Enterococcus.    -Appreciate ID consult  -Awaiting sensitivities   -F/U repeat blood cultures 5/9 --> NGTD  -Continue IV vancomycin and zosyn (Day 4 of 6 week therapy) -TEE today--> no vegetations  -[facility] for home IV antibiotics   Acute on Chronic Normocytic anemia - Hg below baseline 10 with no active bleeding or hemodynamic instability.  -Transfuse if Hg<7 -Monitor CBC  Insulin-dependent Type II DM - A1c 11.5 on 05/12/13. Pt at home on metformin 1000 BID,  Lantus 70 U, and Novolog 27 U TID.  -Appreciate diabetes coordinator recommendations -Increase Lantus to 70 U home dose -Resistant SSI with meals & bedtime -Monitor CBG with meals & bedtime   Hypertension - Currently hypertensive.  -Continue home furosemide 20 mg BID -Resume home carvedilol 3.125 mg BID  Hyponatremia - Improved. Na 135.  Etiology likely due to hypovolemia and hyperglycemia.  -NS bolus as needed  -Monitor BMP -Encourage adequate hydration   Grade 1 Diastolic Chronic Heart Failure -  Possibly mild exacerbation on admission. Last 2D-echo on 03/28/13 with EF 55-60% and grade 1 dysfunction. Pro-BNP 2639 on admission. -Monitor volume status -Daily weight and strict I & O's  Hyperlipidemia - Last lipid panel on 05/12/11 with cholesterol 206, TG 100, HDL 40, LDL 146. Pt at home on crestor 5 mg daily. -Atorvastatin 10 mg daily   Anion Gap Metabolic Acidosis - Resolved. AG 12. Most likely due to severe sepsis.  -Monitor BMP  AKI - Resolved. Cr near baseline of 1. Etiology most likely pre-renal azotemia with Fe Urea 51.7 %.  -Monitor BMP   Acute Respiratory Failure in setting of pulmonary edema - Resolved. Etiology possibly due to acute on chronic diastolic CHF. -Oxygen supplementation to keep SpO2 >92% -Albuterol nebulizer Q 6 hr PRN -Continue IV antibiotics to treat for possible HCAP/aspiration PNA -Incentive spirometry  Diet: Carb modified DVT Ppx: SQ Heparin TID, SCD's Code: Full  Dispo: Disposition is deferred at this time, awaiting improvement of current medical problems.    The patient does have a current PCP (Neema Bobbie Stack, MD) and does need an [facility] hospital follow-up appointment after discharge.  The patient does not have transportation limitations that hinder transportation to clinic appointments.  .Services Needed  at time of discharge: Y = Yes, Blank = No PT:  Yes  OT:   RN:  Yes  Equipment:  3-in-1; tub bench, wheelchair cushion  Other:  IV antibiotics     LOS: 4 days   Juluis Mire, MD 05/14/2013, 1:25 PM

## 2013-05-14 NOTE — H&P (View-Only) (Signed)
[facility] for Infectious Disease  Date of Admission:  05/10/2013  Antibiotics: Vancomycin day 3 Cefazolin day 2  Subjective: No complaints  Objective: Temp:  [98.2 F (36.8 C)-100.5 F (38.1 C)] 98.9 F (37.2 C) (05/10 1146) Pulse Rate:  [94-114] 94 (05/10 1146) Resp:  [14-29] 19 (05/10 1146) BP: (97-139)/(58-78) 111/66 mmHg (05/10 1146) SpO2:  [95 %-100 %] 100 % (05/10 1146)  General: alert, nad Skin: no rashes Abdomen: soft, nt Ext: no edema, right foot wrapped  Lab Results Lab Results  Component Value Date   WBC 12.0* 05/13/2013   HGB 8.1* 05/13/2013   HCT 24.8* 05/13/2013   MCV 84.9 05/13/2013   PLT 337 05/13/2013    Lab Results  Component Value Date   CREATININE 1.27 05/13/2013   BUN 15 05/13/2013   NA 129* 05/13/2013   K 4.2 05/13/2013   CL 95* 05/13/2013   CO2 24 05/13/2013    Lab Results  Component Value Date   ALT 46 05/10/2013   AST 44* 05/10/2013   ALKPHOS 199* 05/10/2013   BILITOT 1.1 05/10/2013      Microbiology: Recent Results (from the past 240 hour(s))  URINE CULTURE     Status: None   Collection Time    05/10/13  7:16 PM      Result Value Ref Range Status   Specimen Description URINE, RANDOM   Final   Special Requests NONE   Final   Culture  Setup Time     Final   Value: 05/11/2013 02:42     Performed at [facility]     Final   Value: NO GROWTH     Performed at [facility]   Culture     Final   Value: NO GROWTH     Performed at [facility]   Report Status 05/11/2013 FINAL   Final  CULTURE, BLOOD (ROUTINE X 2)     Status: None   Collection Time    05/10/13 11:24 PM      Result Value Ref Range Status   Specimen Description BLOOD RIGHT FOREARM   Final   Special Requests BOTTLES DRAWN AEROBIC AND ANAEROBIC 10ML   Final   Culture  Setup Time     Final   Value: 05/11/2013 03:52     Performed at [facility]     Final   Value: PROTEUS MIRABILIS     [place]  AND CHAINS     STAPHYLOCOCCUS AUREUS     Note: Gram Stain Report Called to,Read Back By and Verified With: DARA ANDERSON ON 05/11/2013 AT 6:32P BY WILEJ     Performed at [facility]   Report Status PENDING   Incomplete  CULTURE, BLOOD (ROUTINE X 2)     Status: None   Collection Time    05/10/13 11:27 PM      Result Value Ref Range Status   Specimen Description BLOOD RIGHT FOREARM   Final   Special Requests BOTTLES DRAWN AEROBIC AND ANAEROBIC 10ML   Final   Culture  Setup Time     Final   Value: 05/11/2013 03:51     Performed at [facility]   Culture     Final   Value: PROTEUS MIRABILIS     GRAM POSITIVE COCCI IN PAIRS AND CHAINS     GRAM POSITIVE COCCI IN CLUSTERS     Note: Gram Stain Report Called  to,Read Back By and Verified With: DARA ANDERSON ON 05/11/2013 AT 6:32P BY Dennard Nip     Performed at [facility]   Report Status PENDING   Incomplete  MRSA PCR SCREENING     Status: None   Collection Time    05/11/13  1:45 AM      Result Value Ref Range Status   MRSA by PCR NEGATIVE  NEGATIVE Final   Comment:            The GeneXpert MRSA Assay (FDA     approved for NASAL specimens     only), is one component of a     comprehensive MRSA colonization     surveillance program. It is not     intended to diagnose MRSA     infection nor to guide or     monitor treatment for     MRSA infections.  CULTURE, BLOOD (ROUTINE X 2)     Status: None   Collection Time    05/12/13  1:15 PM      Result Value Ref Range Status   Specimen Description BLOOD LEFT HAND   Final   Special Requests BOTTLES DRAWN AEROBIC AND ANAEROBIC 10CC   Final   Culture  Setup Time     Final   Value: 05/12/2013 17:38     Performed at [facility]   Culture     Final   Value:        BLOOD CULTURE RECEIVED NO GROWTH TO DATE CULTURE WILL BE HELD FOR 5 DAYS BEFORE ISSUING A FINAL NEGATIVE REPORT     Performed at [facility]   Report Status PENDING   Incomplete     Studies/Results: Dg Chest 2 View  05/11/2013   CLINICAL DATA:  dyspnea  EXAM: CHEST  2 VIEW  COMPARISON:  DG CHEST 1V PORT dated 05/10/2013  FINDINGS: Low lung volumes. Cardiac silhouette is enlarged. The area of increased density within the right hemithorax has improved in the interim consistent with improved pulmonary edema. There is mild residual density right lung base. No new focal regions of consolidation and focal infiltrates. No acute osseous abnormalities.  IMPRESSION: Resolving asymmetric edema on the right. No acute cardiopulmonary disease.   Electronically Signed   By: Margaree Mackintosh M.D.   On: 05/11/2013 20:06    Assessment/Plan: 1) osteomyelitis with bacteremia - both Staph aureus and Proteus, 2/2.  Awaiting sensitivity.  Will need 6 weeks antibiotics through IV.  Repeat ngtd from 5/9.   Dr. Johnnye Sima back tomorrow.    Thayer Headings, MD [facility] for Infectious Disease [email or web address] [phone] pager   [phone] cell 05/13/2013, 12:59 PM

## 2013-05-14 NOTE — Progress Notes (Signed)
Temp 101.3. In process of vancomycin 1500mg  IV via PICC, and 2 gm ancef via L foream NSL. C/o pain R foot 7/10. Positive BC drawn on 5/7. 2 percocet given per MD order. Will continue to monitor. Manya Silvas, RN

## 2013-05-14 NOTE — CV Procedure (Signed)
See full TEE report in [facility]; normal LV function; no vegetations. [name]

## 2013-05-14 NOTE — Progress Notes (Signed)
Inpatient Diabetes Program Recommendations  AACE/ADA: New Consensus Statement on Inpatient Glycemic Control (2013)  Target Ranges:  Prepandial:   less than 140 mg/dL      Peak postprandial:   less than 180 mg/dL (1-2 hours)      Critically ill patients:  140 - 180 mg/dL   Results for [name], [name] (MRN [id number]) as of 05/14/2013 10:47  Ref. Range 05/13/2013 07:05 05/13/2013 08:39 05/13/2013 11:45 05/13/2013 17:50 05/13/2013 22:01 05/13/2013 22:34 05/14/2013 08:15  Glucose-Capillary Latest Range: 70-99 mg/dL 274 (H) 262 (H) 184 (H) 310 (H) 351 (H) 375 (H) 260 (H)   Diabetes history: DM2  Outpatient Diabetes medications: Lantus 70 units QHS, Novolog 27 units TID with meals, and Metformin 1000 mg BID  Current orders for Inpatient glycemic control: Lantus 50 units QHS, Novolog 0-20 units AC, Novolog 0-5 units HS  Inpatient Diabetes Program Recommendations Insulin - Basal: Please increase Lantus to 60 units QHS. Insulin - Meal Coverage: Please order Novolog 10 units TID with meals for meal coverage.  Thanks, Barnie Alderman, RN, MSN, CCRN Diabetes Coordinator Inpatient Diabetes Program [phone] (Team Pager) [phone] ([place] office) [phone] [facility] office)

## 2013-05-14 NOTE — Progress Notes (Signed)
Pt in dialysis. Will recheck on pt next day. [place], [place] [phone]

## 2013-05-14 NOTE — Progress Notes (Signed)
  Echocardiogram Echocardiogram Transesophageal has been performed.  [name] [name] 05/14/2013, 4:34 PM

## 2013-05-14 NOTE — Progress Notes (Addendum)
Physical Therapy Treatment Patient Details Name: [name] MRN: [id number] DOB: Nov 05, 1960 Today's Date: 05/14/2013    History of Present Illness Pt is a 53 y/o male admitted s/p amputation of right first ray and amputation of second digit on right. 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    PT Comments    Making progress with mobility; able to manage in-room distance keeping NWBing and managing with RW;  It has been my understanding that the plan has been for pt to dc home to sister's place (no steps to enter); Today, pt indicated he wants to dc home to his place independently (he has 15 steps to access full bathroom)  My best recommendation at this point would be for pt to dc to a place where he can have assist as necessary; Can pt's sister stay with him at his place?   Follow Up Recommendations  Home health PT;Supervision for mobility/OOB HHPT can work with p ton his stairs to access second floor of house     Equipment Recommendations  3in1 (PT);Other (comment);Wheelchair cushion (measurements PT) (Tub bench)    Recommendations for Other Services OT consult     Precautions / Restrictions Precautions Precautions: Fall Precaution Comments: L midfoot amputation previously Restrictions RLE Weight Bearing: Non weight bearing    Mobility  Bed Mobility Overal bed mobility: Needs Assistance Bed Mobility: Supine to Sit     Supine to sit: Supervision     General bed mobility comments: Incr time and use of bedrails; cues to initiate  Transfers Overall transfer level: Needs assistance Equipment used: Rolling walker (2 wheeled) Transfers: Sit to/from Stand Sit to Stand: Min guard         General transfer comment: Cues for safety, hand placement, and to avoid touching RLE to floor; minimal difficulty keeping NWB  Ambulation/Gait Ambulation/Gait assistance: Min guard Ambulation Distance (Feet): 24 Feet Assistive  device: Rolling walker (2 wheeled)       General Gait Details: Quite good maintenance of NWB during gait; noted quite fatigued at end of walk   Stairs            Wheelchair Mobility    Modified Rankin (Stroke Patients Only)       Balance                                    Cognition Arousal/Alertness: Awake/alert Behavior During Therapy: WFL for tasks assessed/performed Overall Cognitive Status: Within Functional Limits for tasks assessed                      Exercises      General Comments        Pertinent Vitals/Pain no apparent distress elevated for edema and pain control      Home Living                      Prior Function            PT Goals (current goals can now be found in the care plan section) Acute Rehab PT Goals Patient Stated Goal: To return home PT Goal Formulation: With patient Time For Goal Achievement: 05/25/13 Potential to Achieve Goals: Good Progress towards PT goals: Progressing toward goals    Frequency  Min 3X/week    PT Plan Current plan remains appropriate (will consider crutches and adding a  stair goal)    Co-evaluation             End of Session Equipment Utilized During Treatment: Gait belt Activity Tolerance: Patient tolerated treatment well Patient left: in chair;with call bell/phone within reach     Time: 1150-1213 PT Time Calculation (min): 23 min  Charges:  $Gait Training: 8-22 mins $Therapeutic Activity: 8-22 mins                    G Codes:      [facility] 05/14/2013, 1:46 PM  Roney Marion, [place] Pager [phone] Office [phone]

## 2013-05-14 NOTE — Interval H&P Note (Signed)
History and Physical Interval Note:  05/14/2013 2:39 PM  [name]  has presented today for surgery, with the diagnosis of bactermia  The various methods of treatment have been discussed with the patient and family. After consideration of risks, benefits and other options for treatment, the patient has consented to  Procedure(s) with comments: TRANSESOPHAGEAL ECHOCARDIOGRAM (TEE) (N/A) - patient had breakfast at 0900 as a surgical intervention .  The patient's history has been reviewed, patient examined, no change in status, stable for surgery.  I have reviewed the patient's chart and labs.  Questions were answered to the patient's satisfaction.     [name]

## 2013-05-14 NOTE — Progress Notes (Addendum)
INFECTIOUS DISEASE PROGRESS NOTE  ID: [name] is a 53 y.o. male with  Principal Problem:   Sepsis Active Problems:   Diabetes mellitus type 2, uncontrolled, with complications   HYPERTENSION   Hyponatremia   Osteomyelitis of right foot   Acute respiratory failure   Hyperglycemia  Subjective: Without complaints, questions about MRSA.   Abtx:  Anti-infectives   Start     Dose/Rate Route Frequency Ordered Stop   05/14/13 1600  [MAR Hold]  ceFAZolin (ANCEF) IVPB 2 g/50 mL premix     (On MAR Hold since 05/14/13 1355)   2 g 100 mL/hr over 30 Minutes Intravenous Every 8 hours 05/14/13 1315     05/13/13 0600  [MAR Hold]  vancomycin (VANCOCIN) 1,500 mg in sodium chloride 0.9 % 500 mL IVPB     (On MAR Hold since 05/14/13 1355)   1,500 mg 250 mL/hr over 120 Minutes Intravenous Every 12 hours 05/12/13 2301     05/12/13 1600  ceFAZolin (ANCEF) IVPB 1 g/50 mL premix  Status:  Discontinued     1 g 100 mL/hr over 30 Minutes Intravenous Every 8 hours 05/12/13 1508 05/14/13 1315   05/11/13 0600  piperacillin-tazobactam (ZOSYN) IVPB 3.375 g  Status:  Discontinued     3.375 g 12.5 mL/hr over 240 Minutes Intravenous Every 8 hours 05/10/13 2313 05/12/13 1456   05/10/13 2315  vancomycin (VANCOCIN) IVPB 1000 mg/200 mL premix  Status:  Discontinued     1,000 mg 200 mL/hr over 60 Minutes Intravenous Every 12 hours 05/10/13 2313 05/12/13 2300   05/10/13 2315  piperacillin-tazobactam (ZOSYN) IVPB 3.375 g     3.375 g 100 mL/hr over 30 Minutes Intravenous  Once 05/10/13 2313 05/11/13 0036      Medications:  Scheduled: . aspirin EC  325 mg Oral Daily  . atorvastatin  10 mg Oral q1800  . carvedilol  3.125 mg Oral BID WC  .  ceFAZolin (ANCEF) IV  2 g Intravenous Q8H  . dextromethorphan-guaiFENesin  1 tablet Oral BID  . furosemide  20 mg Oral BID  . gabapentin  600 mg Oral TID  . heparin  5,000 Units Subcutaneous 3 times per day  . insulin aspart  0-20 Units Subcutaneous TID WC  . insulin  aspart  0-5 Units Subcutaneous QHS  . insulin glargine  70 Units Subcutaneous QHS  . polyethylene glycol  17 g Oral Once  . senna-docusate  1 tablet Oral Once  . sodium chloride  10-40 mL Intracatheter Q12H  . sodium chloride  3 mL Intravenous Q12H  . vancomycin  1,500 mg Intravenous Q12H    Objective: Vital signs in last 24 hours: Temp:  [97.9 F (36.6 C)-99.1 F (37.3 C)] 98.9 F (37.2 C) (05/11 1543) Pulse Rate:  [88-108] 100 (05/11 1605) Resp:  [15-32] 29 (05/11 1605) BP: (127-186)/(65-88) 166/73 mmHg (05/11 1605) SpO2:  [92 %-100 %] 93 % (05/11 1605) Weight:  [89.037 kg (196 lb 4.7 oz)] 89.037 kg (196 lb 4.7 oz) (05/10 2205)   General appearance: alert, cooperative and no distress Resp: clear to auscultation bilaterally Cardio: regular rate and rhythm GI: normal findings: bowel sounds normal and soft, non-tender Extremities: RLE wrapped  Lab Results  Recent Labs  05/13/13 0545 05/14/13 0500  WBC 12.0* 10.7*  HGB 8.1* 7.1*  HCT 24.8* 22.2*  NA 129* 135*  K 4.2 4.2  CL 95* 98  CO2 24 25  BUN 15 13  CREATININE 1.27 1.03   Liver Panel No results  found for this basename: PROT, ALBUMIN, AST, ALT, ALKPHOS, BILITOT, BILIDIR, IBILI,  in the last 72 hours Sedimentation Rate No results found for this basename: ESRSEDRATE,  in the last 72 hours C-Reactive Protein No results found for this basename: CRP,  in the last 72 hours  Microbiology: Recent Results (from the past 240 hour(s))  URINE CULTURE     Status: None   Collection Time    05/10/13  7:16 PM      Result Value Ref Range Status   Specimen Description URINE, RANDOM   Final   Special Requests NONE   Final   Culture  Setup Time     Final   Value: 05/11/2013 02:42     Performed at [place]     Final   Value: NO GROWTH     Performed at [facility]   Culture     Final   Value: NO GROWTH     Performed at [facility]   Report Status 05/11/2013 FINAL   Final    CULTURE, BLOOD (ROUTINE X 2)     Status: None   Collection Time    05/10/13 11:24 PM      Result Value Ref Range Status   Specimen Description BLOOD RIGHT FOREARM   Final   Special Requests BOTTLES DRAWN AEROBIC AND ANAEROBIC 10ML   Final   Culture  Setup Time     Final   Value: 05/11/2013 03:52     Performed at [facility]   Culture     Final   Value: PROTEUS MIRABILIS     ENTEROCOCCUS SPECIES     STAPHYLOCOCCUS AUREUS     Note: Gram Stain Report Called to,Read Back By and Verified With: DARA ANDERSON ON 05/11/2013 AT 6:32P BY WILEJ     Performed at [facility]   Report Status PENDING   Incomplete   Organism ID, Bacteria PROTEUS MIRABILIS   Final  CULTURE, BLOOD (ROUTINE X 2)     Status: None   Collection Time    05/10/13 11:27 PM      Result Value Ref Range Status   Specimen Description BLOOD RIGHT FOREARM   Final   Special Requests BOTTLES DRAWN AEROBIC AND ANAEROBIC 10ML   Final   Culture  Setup Time     Final   Value: 05/11/2013 03:51     Performed at [facility]   Culture     Final   Value: PROTEUS MIRABILIS     Note: SUSCEPTIBILITIES PERFORMED ON PREVIOUS CULTURE WITHIN THE LAST 5 DAYS.     ENTEROCOCCUS SPECIES     METHICILLIN RESISTANT STAPHYLOCOCCUS AUREUS     Note: RIFAMPIN AND GENTAMICIN SHOULD NOT BE USED AS SINGLE DRUGS FOR TREATMENT OF STAPH INFECTIONS. CRITICAL RESULT CALLED TO, READ BACK BY AND VERIFIED WITH: KATHA H@2 :37PM ON 05/14/13 BY DANTS   Report Status PENDING   Incomplete  MRSA PCR SCREENING     Status: None   Collection Time    05/11/13  1:45 AM      Result Value Ref Range Status   MRSA by PCR NEGATIVE  NEGATIVE Final   Comment:            The GeneXpert MRSA Assay (FDA     approved for NASAL specimens     only), is one component of a     comprehensive MRSA colonization     surveillance program. It is not  intended to diagnose MRSA     infection nor to guide or     monitor treatment for     MRSA infections.   CULTURE, BLOOD (ROUTINE X 2)     Status: None   Collection Time    05/12/13  1:15 PM      Result Value Ref Range Status   Specimen Description BLOOD LEFT HAND   Final   Special Requests BOTTLES DRAWN AEROBIC AND ANAEROBIC 10CC   Final   Culture  Setup Time     Final   Value: 05/12/2013 17:38     Performed at [facility]   Culture     Final   Value:        BLOOD CULTURE RECEIVED NO GROWTH TO DATE CULTURE WILL BE HELD FOR 5 DAYS BEFORE ISSUING A FINAL NEGATIVE REPORT     Performed at [facility]   Report Status PENDING   Incomplete    Studies/Results: No results found.   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  Repeat BCx 5-9 pending TEE no vegetation.   Check HIV test Diabetic control (currently his FSG is 170)          Camp[facility] Riches Infectious Diseases (pager) [phone] [email or web address] 05/14/2013, 4:32 PM  LOS: 4 days   **Disclaimer: This note may have been dictated with voice recognition software. Similar sounding words can inadvertently be transcribed and this note may contain transcription errors which may not have been corrected upon publication of note.**

## 2013-05-15 ENCOUNTER — Encounter (HOSPITAL_COMMUNITY): Payer: Self-pay | Admitting: Cardiology

## 2013-05-15 DIAGNOSIS — IMO0001 Reserved for inherently not codable concepts without codable children: Secondary | ICD-10-CM

## 2013-05-15 LAB — CBC
HEMATOCRIT: 22.2 % — AB (ref 39.0–52.0)
HEMOGLOBIN: 7.1 g/dL — AB (ref 13.0–17.0)
MCH: 27.4 pg (ref 26.0–34.0)
MCHC: 32 g/dL (ref 30.0–36.0)
MCV: 85.7 fL (ref 78.0–100.0)
Platelets: 385 10*3/uL (ref 150–400)
RBC: 2.59 MIL/uL — ABNORMAL LOW (ref 4.22–5.81)
RDW: 14.7 % (ref 11.5–15.5)
WBC: 15.5 10*3/uL — ABNORMAL HIGH (ref 4.0–10.5)

## 2013-05-15 LAB — GLUCOSE, CAPILLARY
Glucose-Capillary: 185 mg/dL — ABNORMAL HIGH (ref 70–99)
Glucose-Capillary: 163 mg/dL — ABNORMAL HIGH (ref 70–99)
Glucose-Capillary: 194 mg/dL — ABNORMAL HIGH (ref 70–99)
Glucose-Capillary: 294 mg/dL — ABNORMAL HIGH (ref 70–99)

## 2013-05-15 LAB — CULTURE, BLOOD (ROUTINE X 2)

## 2013-05-15 LAB — BASIC METABOLIC PANEL
BUN: 11 mg/dL (ref 6–23)
CHLORIDE: 97 meq/L (ref 96–112)
CO2: 27 meq/L (ref 19–32)
Calcium: 8.1 mg/dL — ABNORMAL LOW (ref 8.4–10.5)
Creatinine, Ser: 0.98 mg/dL (ref 0.50–1.35)
GFR calc non Af Amer: >90 mL/min (ref >90)
GLUCOSE: 186 mg/dL — AB (ref 70–99)
POTASSIUM: 3.7 meq/L (ref 3.7–5.3)
Sodium: 136 mEq/L — ABNORMAL LOW (ref 137–147)

## 2013-05-15 LAB — HIV ANTIBODY (ROUTINE TESTING W REFLEX): HIV 1&2 Ab, 4th Generation: NONREACTIVE

## 2013-05-15 LAB — VANCOMYCIN, TROUGH: VANCOMYCIN TR: 12.9 ug/mL (ref 10.0–20.0)

## 2013-05-15 MED ORDER — AMLODIPINE BESYLATE 5 MG PO TABS
5.0000 mg | ORAL_TABLET | Freq: Every day | ORAL | Status: DC
  Administered 2013-05-15 – 2013-05-21 (×7): 5 mg via ORAL
  Filled 2013-05-15 (×7): qty 1

## 2013-05-15 MED ORDER — VANCOMYCIN HCL IN DEXTROSE 1-5 GM/200ML-% IV SOLN
1000.0000 mg | Freq: Three times a day (TID) | INTRAVENOUS | Status: DC
  Administered 2013-05-16 – 2013-05-17 (×4): 1000 mg via INTRAVENOUS
  Filled 2013-05-15 (×6): qty 200

## 2013-05-15 MED ORDER — SODIUM CHLORIDE 0.9 % IV SOLN
3.0000 g | Freq: Four times a day (QID) | INTRAVENOUS | Status: DC
  Administered 2013-05-15 – 2013-05-21 (×24): 3 g via INTRAVENOUS
  Filled 2013-05-15 (×28): qty 3

## 2013-05-15 NOTE — Progress Notes (Signed)
INFECTIOUS DISEASE PROGRESS NOTE  ID: [name] is a 53 y.o. male with  Principal Problem:   Sepsis Active Problems:   Diabetes mellitus type 2, uncontrolled, with complications   HYPERTENSION   Hyponatremia   Osteomyelitis of right foot   Acute respiratory failure   Hyperglycemia  Subjective: Without complaints. Feels cold.   Abtx:  Anti-infectives   Start     Dose/Rate Route Frequency Ordered Stop   05/15/13 1200  Ampicillin-Sulbactam (UNASYN) 3 g in sodium chloride 0.9 % 100 mL IVPB     3 g 100 mL/hr over 60 Minutes Intravenous Every 6 hours 05/15/13 1056     05/14/13 1600  ceFAZolin (ANCEF) IVPB 2 g/50 mL premix  Status:  Discontinued     2 g 100 mL/hr over 30 Minutes Intravenous Every 8 hours 05/14/13 1315 05/15/13 1036   05/13/13 0600  vancomycin (VANCOCIN) 1,500 mg in sodium chloride 0.9 % 500 mL IVPB     1,500 mg 250 mL/hr over 120 Minutes Intravenous Every 12 hours 05/12/13 2301     05/12/13 1600  ceFAZolin (ANCEF) IVPB 1 g/50 mL premix  Status:  Discontinued     1 g 100 mL/hr over 30 Minutes Intravenous Every 8 hours 05/12/13 1508 05/14/13 1315   05/11/13 0600  piperacillin-tazobactam (ZOSYN) IVPB 3.375 g  Status:  Discontinued     3.375 g 12.5 mL/hr over 240 Minutes Intravenous Every 8 hours 05/10/13 2313 05/12/13 1456   05/10/13 2315  vancomycin (VANCOCIN) IVPB 1000 mg/200 mL premix  Status:  Discontinued     1,000 mg 200 mL/hr over 60 Minutes Intravenous Every 12 hours 05/10/13 2313 05/12/13 2300   05/10/13 2315  piperacillin-tazobactam (ZOSYN) IVPB 3.375 g     3.375 g 100 mL/hr over 30 Minutes Intravenous  Once 05/10/13 2313 05/11/13 0036      Medications:  Scheduled: . amLODipine  5 mg Oral Daily  . ampicillin-sulbactam (UNASYN) IV  3 g Intravenous Q6H  . aspirin EC  325 mg Oral Daily  . atorvastatin  10 mg Oral q1800  . carvedilol  3.125 mg Oral BID WC  . dextromethorphan-guaiFENesin  1 tablet Oral BID  . furosemide  20 mg Oral BID  .  gabapentin  600 mg Oral TID  . heparin  5,000 Units Subcutaneous 3 times per day  . insulin aspart  0-20 Units Subcutaneous TID WC  . insulin aspart  0-5 Units Subcutaneous QHS  . insulin glargine  70 Units Subcutaneous QHS  . sodium chloride  10-40 mL Intracatheter Q12H  . sodium chloride  3 mL Intravenous Q12H  . vancomycin  1,500 mg Intravenous Q12H    Objective: Vital signs in last 24 hours: Temp:  [97.7 F (36.5 C)-99.3 F (37.4 C)] 97.7 F (36.5 C) (05/12 1100) Pulse Rate:  [80-101] 80 (05/12 1100) Resp:  [17-32] 19 (05/12 1100) BP: (126-169)/(70-78) 150/76 mmHg (05/12 1100) SpO2:  [92 %-100 %] 96 % (05/12 1100) Weight:  [90.8 kg (200 lb 2.8 oz)] 90.8 kg (200 lb 2.8 oz) (05/12 0500)   General appearance: alert, cooperative and no distress Resp: clear to auscultation bilaterally Cardio: regular rate and rhythm GI: normal findings: bowel sounds normal and soft, non-tender Incision/Wound: RLE dressed.   Lab Results  Recent Labs  05/14/13 0500 05/14/13 1718 05/15/13 0500  WBC 10.7* 14.2* 15.5*  HGB 7.1* 8.4* 7.1*  HCT 22.2* 26.0* 22.2*  NA 135*  --  136*  K 4.2  --  3.7  CL 98  --  97  CO2 25  --  27  BUN 13  --  11  CREATININE 1.03  --  0.98   Liver Panel No results found for this basename: PROT, ALBUMIN, AST, ALT, ALKPHOS, BILITOT, BILIDIR, IBILI,  in the last 72 hours Sedimentation Rate No results found for this basename: ESRSEDRATE,  in the last 72 hours C-Reactive Protein No results found for this basename: CRP,  in the last 72 hours  Microbiology: Recent Results (from the past 240 hour(s))  URINE CULTURE     Status: None   Collection Time    05/10/13  7:16 PM      Result Value Ref Range Status   Specimen Description URINE, RANDOM   Final   Special Requests NONE   Final   Culture  Setup Time     Final   Value: 05/11/2013 02:42     Performed at [place]     Final   Value: NO GROWTH     Performed at [facility]    Culture     Final   Value: NO GROWTH     Performed at [facility]   Report Status 05/11/2013 FINAL   Final  CULTURE, BLOOD (ROUTINE X 2)     Status: None   Collection Time    05/10/13 11:24 PM      Result Value Ref Range Status   Specimen Description BLOOD RIGHT FOREARM   Final   Special Requests BOTTLES DRAWN AEROBIC AND ANAEROBIC 10ML   Final   Culture  Setup Time     Final   Value: 05/11/2013 03:52     Performed at [facility]   Culture     Final   Value: PROTEUS MIRABILIS     ENTEROCOCCUS SPECIES     Note: SUSCEPTIBILITIES PERFORMED ON PREVIOUS CULTURE WITHIN THE LAST 5 DAYS.     STAPHYLOCOCCUS AUREUS     Note: Susceptibilities performed on previous culture within the last 5 days.   Report Status 05/15/2013 FINAL   Final   Organism ID, Bacteria PROTEUS MIRABILIS   Final  CULTURE, BLOOD (ROUTINE X 2)     Status: None   Collection Time    05/10/13 11:27 PM      Result Value Ref Range Status   Specimen Description BLOOD RIGHT FOREARM   Final   Special Requests BOTTLES DRAWN AEROBIC AND ANAEROBIC 10ML   Final   Culture  Setup Time     Final   Value: 05/11/2013 03:51     Performed at [facility]   Culture     Final   Value: PROTEUS MIRABILIS     Note: SUSCEPTIBILITIES PERFORMED ON PREVIOUS CULTURE WITHIN THE LAST 5 DAYS.     ENTEROCOCCUS SPECIES     METHICILLIN RESISTANT STAPHYLOCOCCUS AUREUS     Note: RIFAMPIN AND GENTAMICIN SHOULD NOT BE USED AS SINGLE DRUGS FOR TREATMENT OF STAPH INFECTIONS. CRITICAL RESULT CALLED TO, READ BACK BY AND VERIFIED WITH: KATHA H@2 :37PM ON 05/14/13 BY DANTS   Report Status 05/15/2013 FINAL   Final   Organism ID, Bacteria ENTEROCOCCUS SPECIES   Final   Organism ID, Bacteria METHICILLIN RESISTANT STAPHYLOCOCCUS AUREUS   Final  MRSA PCR SCREENING     Status: None   Collection Time    05/11/13  1:45 AM      Result Value Ref Range Status   MRSA by PCR NEGATIVE  NEGATIVE Final   Comment:  The GeneXpert MRSA  Assay (FDA     approved for NASAL specimens     only), is one component of a     comprehensive MRSA colonization     surveillance program. It is not     intended to diagnose MRSA     infection nor to guide or     monitor treatment for     MRSA infections.  CULTURE, BLOOD (ROUTINE X 2)     Status: None   Collection Time    05/12/13  1:15 PM      Result Value Ref Range Status   Specimen Description BLOOD LEFT HAND   Final   Special Requests BOTTLES DRAWN AEROBIC AND ANAEROBIC 10CC   Final   Culture  Setup Time     Final   Value: 05/12/2013 17:38     Performed at [facility]   Culture     Final   Value:        BLOOD CULTURE RECEIVED NO GROWTH TO DATE CULTURE WILL BE HELD FOR 5 DAYS BEFORE ISSUING A FINAL NEGATIVE REPORT     Performed at [facility]   Report Status PENDING   Incomplete    Studies/Results: No results found.   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  Repeat BCx 5-9 pending  TEE no vegetation.  HIV test (-) Diabetic control (FSG [id number]) Plan for 21 total days of anbx  Glad to see in ID f/u         [facility] (pager) [phone] [email or web address] 05/15/2013, 3:15 PM  LOS: 5 days   **Disclaimer: This note may have been dictated with voice recognition software. Similar sounding words can inadvertently be transcribed and this note may contain transcription errors which may not have been corrected upon publication of note.**

## 2013-05-15 NOTE — Progress Notes (Signed)
Spoke with patient about diabetes and home regimen for diabetes control.  Patient reports that his PCP helps him manage his diabetes and currently he takes Lantus 70 units QHS, Novolog 27 units TID with meals, and Metformin 1000 mg BID as an outpatient for diabetes control.  Discussed A1C results (11.5% on 05/12/13) and explained what an A1C is, basic pathophysiology of DM Type 2, basic home care, importance of checking CBGs and maintaining good CBG control to prevent long-term and short-term complications. Patient reports that his previous A1C was in the 14% range and he feels that his diabetes has been under better control over the few months.  He reports that he was not able to afford his medications prior to receiving his "[employer]" a few months ago because they were to expensive. He states that he was turned down for Medicare and Medicaid prior to applying for the [employer].  Patient reports that he is now able to get everything he needs (medication and testing supplies) to get his diabetes control.  Discussed impact of nutrition, exercise, stress, and sickness/infection on diabetes control and stressed importance of control to promote wound healing.  Patient verbalized understanding of information discussed and states that he does not have any further questions related to diabetes at this time.  Thanks, Barnie Alderman, RN, MSN, CCRN Diabetes Coordinator Inpatient Diabetes Program [phone] (Team Pager) [phone] ([facility] office) [phone] [facility] office)

## 2013-05-15 NOTE — Progress Notes (Addendum)
ANTIBIOTIC CONSULT NOTE - FOLLOW UP  Pharmacy Consult for vancomycin  Indication: MRSA bacteremia  No Known Allergies  Patient Measurements: Height: 6\' 3"  (190.5 cm) Weight: 200 lb 2.8 oz (90.8 kg) IBW/kg (Calculated) : 84.5  Vital Signs: Temp: 98.6 F (37 C) (05/12 1700) Temp src: Oral (05/12 1700) BP: 153/64 mmHg (05/12 1700) Pulse Rate: 92 (05/12 1700) Intake/Output from previous day: 05/11 0701 - 05/12 0700 In: 1541.3 [P.O.:660; I.V.:231.3; IV Piggyback:650] Out: 2375 [Urine:2375] Intake/Output from this shift:    Labs:  Recent Labs  05/13/13 0545 05/14/13 0500 05/14/13 1718 05/15/13 0500  WBC 12.0* 10.7* 14.2* 15.5*  HGB 8.1* 7.1* 8.4* 7.1*  PLT 337 341 371 385  CREATININE 1.27 1.03  --  0.98   Estimated Creatinine Clearance: 104.2 ml/min (by C-G formula based on Cr of 0.98).  Recent Labs  05/12/13 2130 05/15/13 1820  VANCOTROUGH 11.2 12.9     Microbiology: Recent Results (from the past 720 hour(s))  URINE CULTURE     Status: None   Collection Time    05/10/13  7:16 PM      Result Value Ref Range Status   Specimen Description URINE, RANDOM   Final   Special Requests NONE   Final   Culture  Setup Time     Final   Value: 05/11/2013 02:42     Performed at [place]     Final   Value: NO GROWTH     Performed at [facility]   Culture     Final   Value: NO GROWTH     Performed at [facility]   Report Status 05/11/2013 FINAL   Final  CULTURE, BLOOD (ROUTINE X 2)     Status: None   Collection Time    05/10/13 11:24 PM      Result Value Ref Range Status   Specimen Description BLOOD RIGHT FOREARM   Final   Special Requests BOTTLES DRAWN AEROBIC AND ANAEROBIC 10ML   Final   Culture  Setup Time     Final   Value: 05/11/2013 03:52     Performed at [facility]   Culture     Final   Value: PROTEUS MIRABILIS     ENTEROCOCCUS SPECIES     Note: SUSCEPTIBILITIES PERFORMED ON PREVIOUS CULTURE WITHIN THE  LAST 5 DAYS.     STAPHYLOCOCCUS AUREUS     Note: Susceptibilities performed on previous culture within the last 5 days.   Report Status 05/15/2013 FINAL   Final   Organism ID, Bacteria PROTEUS MIRABILIS   Final  CULTURE, BLOOD (ROUTINE X 2)     Status: None   Collection Time    05/10/13 11:27 PM      Result Value Ref Range Status   Specimen Description BLOOD RIGHT FOREARM   Final   Special Requests BOTTLES DRAWN AEROBIC AND ANAEROBIC 10ML   Final   Culture  Setup Time     Final   Value: 05/11/2013 03:51     Performed at [facility]   Culture     Final   Value: PROTEUS MIRABILIS     Note: SUSCEPTIBILITIES PERFORMED ON PREVIOUS CULTURE WITHIN THE LAST 5 DAYS.     ENTEROCOCCUS SPECIES     METHICILLIN RESISTANT STAPHYLOCOCCUS AUREUS     Note: RIFAMPIN AND GENTAMICIN SHOULD NOT BE USED AS SINGLE DRUGS FOR TREATMENT OF STAPH INFECTIONS. CRITICAL RESULT CALLED TO, READ BACK BY AND VERIFIED WITH: Katina Degree  H@2 :37PM ON 05/14/13 BY DANTS   Report Status 05/15/2013 FINAL   Final   Organism ID, Bacteria ENTEROCOCCUS SPECIES   Final   Organism ID, Bacteria METHICILLIN RESISTANT STAPHYLOCOCCUS AUREUS   Final  MRSA PCR SCREENING     Status: None   Collection Time    05/11/13  1:45 AM      Result Value Ref Range Status   MRSA by PCR NEGATIVE  NEGATIVE Final   Comment:            The GeneXpert MRSA Assay (FDA     approved for NASAL specimens     only), is one component of a     comprehensive MRSA colonization     surveillance program. It is not     intended to diagnose MRSA     infection nor to guide or     monitor treatment for     MRSA infections.  CULTURE, BLOOD (ROUTINE X 2)     Status: None   Collection Time    05/12/13  1:15 PM      Result Value Ref Range Status   Specimen Description BLOOD LEFT HAND   Final   Special Requests BOTTLES DRAWN AEROBIC AND ANAEROBIC 10CC   Final   Culture  Setup Time     Final   Value: 05/12/2013 17:38     Performed at [facility]    Culture     Final   Value:        BLOOD CULTURE RECEIVED NO GROWTH TO DATE CULTURE WILL BE HELD FOR 5 DAYS BEFORE ISSUING A FINAL NEGATIVE REPORT     Performed at [facility]   Report Status PENDING   Incomplete    Anti-infectives   Start     Dose/Rate Route Frequency Ordered Stop   05/15/13 1200  Ampicillin-Sulbactam (UNASYN) 3 g in sodium chloride 0.9 % 100 mL IVPB     3 g 100 mL/hr over 60 Minutes Intravenous Every 6 hours 05/15/13 1056     05/14/13 1600  ceFAZolin (ANCEF) IVPB 2 g/50 mL premix  Status:  Discontinued     2 g 100 mL/hr over 30 Minutes Intravenous Every 8 hours 05/14/13 1315 05/15/13 1036   05/13/13 0600  vancomycin (VANCOCIN) 1,500 mg in sodium chloride 0.9 % 500 mL IVPB     1,500 mg 250 mL/hr over 120 Minutes Intravenous Every 12 hours 05/12/13 2301     05/12/13 1600  ceFAZolin (ANCEF) IVPB 1 g/50 mL premix  Status:  Discontinued     1 g 100 mL/hr over 30 Minutes Intravenous Every 8 hours 05/12/13 1508 05/14/13 1315   05/11/13 0600  piperacillin-tazobactam (ZOSYN) IVPB 3.375 g  Status:  Discontinued     3.375 g 12.5 mL/hr over 240 Minutes Intravenous Every 8 hours 05/10/13 2313 05/12/13 1456   05/10/13 2315  vancomycin (VANCOCIN) IVPB 1000 mg/200 mL premix  Status:  Discontinued     1,000 mg 200 mL/hr over 60 Minutes Intravenous Every 12 hours 05/10/13 2313 05/12/13 2300   05/10/13 2315  piperacillin-tazobactam (ZOSYN) IVPB 3.375 g     3.375 g 100 mL/hr over 30 Minutes Intravenous  Once 05/10/13 2313 05/11/13 0036      Assessment: 53 y/o male on vancomycin currently and Unasyn for Proteus mirabilis, MRSA, and Enterococcus bacteremia - these are appropriate for sensitivities, ID also following. Renal function is normal. WBC are elevated and he is afebrile. Repeat blood culture is negative thus far.  Vanc  5/8>> VT 11.2 adj for calc'd VT 18> 1.5g q12h Zosyn 5/8>5/9 Cefazolin 5/9>>5/12 Unasyn 5/12>>  5/7 UCx> ng 5/7 Bcx x2> Proteus mirabilis (pan  sens), MRSA, Enterococcus (sens amp) 5/9 BCx>> ngtd  Repeat vancomycin trough is 12.9 on 1.5g IV q12h. Patient has been making good urine. Last dose was given at 18:51PM (after trough was drawn).    Goal of Therapy:  Vancomycin trough level 15-20 mcg/ml Eradication of infection  Plan:  - Change Vancomycin 1000 mg IV q8h (more frequent dosing predicts trough ~16.4) with repeat level at steady state.  - Monitor renal function, clinical course, and culture data  Sloan Leiter, PharmD, BCPS Clinical Pharmacist [phone]  05/15/2013 7:21 PM

## 2013-05-15 NOTE — Progress Notes (Signed)
ANTIBIOTIC CONSULT NOTE - FOLLOW UP  Pharmacy Consult for vancomycin and Unasyn Indication: Proteus mirabilis, MRSA, and Enterococcus bacteremia  No Known Allergies  Patient Measurements: Height: 6\' 3"  (190.5 cm) Weight: 200 lb 2.8 oz (90.8 kg) IBW/kg (Calculated) : 84.5  Vital Signs: Temp: 99.3 F (37.4 C) (05/12 0952) Temp src: Oral (05/12 0952) BP: 136/76 mmHg (05/12 0952) Pulse Rate: 89 (05/12 0952) Intake/Output from previous day: 05/11 0701 - 05/12 0700 In: 1541.3 [P.O.:660; I.V.:231.3; IV Piggyback:650] Out: 2375 [Urine:2375] Intake/Output from this shift: Total I/O In: 170 [P.O.:120; IV Piggyback:50] Out: 0   Labs:  Recent Labs  05/13/13 0545 05/14/13 0500 05/14/13 1718 05/15/13 0500  WBC 12.0* 10.7* 14.2* 15.5*  HGB 8.1* 7.1* 8.4* 7.1*  PLT 337 341 371 385  CREATININE 1.27 1.03  --  0.98   Estimated Creatinine Clearance: 104.2 ml/min (by C-G formula based on Cr of 0.98).  Recent Labs  05/12/13 2130  [place] 11.2     Microbiology: Recent Results (from the past 720 hour(s))  URINE CULTURE     Status: None   Collection Time    05/10/13  7:16 PM      Result Value Ref Range Status   Specimen Description URINE, RANDOM   Final   Special Requests NONE   Final   Culture  Setup Time     Final   Value: 05/11/2013 02:42     Performed at [facility]     Final   Value: NO GROWTH     Performed at [facility]   Culture     Final   Value: NO GROWTH     Performed at [facility]   Report Status 05/11/2013 FINAL   Final  CULTURE, BLOOD (ROUTINE X 2)     Status: None   Collection Time    05/10/13 11:24 PM      Result Value Ref Range Status   Specimen Description BLOOD RIGHT FOREARM   Final   Special Requests BOTTLES DRAWN AEROBIC AND ANAEROBIC 10ML   Final   Culture  Setup Time     Final   Value: 05/11/2013 03:52     Performed at [facility]   Culture     Final   Value: PROTEUS MIRABILIS      ENTEROCOCCUS SPECIES     STAPHYLOCOCCUS AUREUS     Note: Gram Stain Report Called to,Read Back By and Verified With: DARA ANDERSON ON 05/11/2013 AT 6:32P BY Dennard Nip     Performed at [facility]   Report Status 05/15/2013 FINAL   Final   Organism ID, Bacteria PROTEUS MIRABILIS   Final  CULTURE, BLOOD (ROUTINE X 2)     Status: None   Collection Time    05/10/13 11:27 PM      Result Value Ref Range Status   Specimen Description BLOOD RIGHT FOREARM   Final   Special Requests BOTTLES DRAWN AEROBIC AND ANAEROBIC 10ML   Final   Culture  Setup Time     Final   Value: 05/11/2013 03:51     Performed at [facility]   Culture     Final   Value: PROTEUS MIRABILIS     Note: SUSCEPTIBILITIES PERFORMED ON PREVIOUS CULTURE WITHIN THE LAST 5 DAYS.     ENTEROCOCCUS SPECIES     METHICILLIN RESISTANT STAPHYLOCOCCUS AUREUS     Note: RIFAMPIN AND GENTAMICIN SHOULD NOT BE USED AS SINGLE DRUGS FOR TREATMENT OF STAPH  INFECTIONS. CRITICAL RESULT CALLED TO, READ BACK BY AND VERIFIED WITH: KATHA H@2 :37PM ON 05/14/13 BY DANTS   Report Status 05/15/2013 FINAL   Final   Organism ID, Bacteria ENTEROCOCCUS SPECIES   Final   Organism ID, Bacteria METHICILLIN RESISTANT STAPHYLOCOCCUS AUREUS   Final  MRSA PCR SCREENING     Status: None   Collection Time    05/11/13  1:45 AM      Result Value Ref Range Status   MRSA by PCR NEGATIVE  NEGATIVE Final   Comment:            The GeneXpert MRSA Assay (FDA     approved for NASAL specimens     only), is one component of a     comprehensive MRSA colonization     surveillance program. It is not     intended to diagnose MRSA     infection nor to guide or     monitor treatment for     MRSA infections.  CULTURE, BLOOD (ROUTINE X 2)     Status: None   Collection Time    05/12/13  1:15 PM      Result Value Ref Range Status   Specimen Description BLOOD LEFT HAND   Final   Special Requests BOTTLES DRAWN AEROBIC AND ANAEROBIC 10CC   Final   Culture  Setup Time      Final   Value: 05/12/2013 17:38     Performed at [facility]   Culture     Final   Value:        BLOOD CULTURE RECEIVED NO GROWTH TO DATE CULTURE WILL BE HELD FOR 5 DAYS BEFORE ISSUING A FINAL NEGATIVE REPORT     Performed at [facility]   Report Status PENDING   Incomplete    Anti-infectives   Start     Dose/Rate Route Frequency Ordered Stop   05/15/13 1200  Ampicillin-Sulbactam (UNASYN) 3 g in sodium chloride 0.9 % 100 mL IVPB     3 g 100 mL/hr over 60 Minutes Intravenous Every 6 hours 05/15/13 1056     05/14/13 1600  ceFAZolin (ANCEF) IVPB 2 g/50 mL premix  Status:  Discontinued     2 g 100 mL/hr over 30 Minutes Intravenous Every 8 hours 05/14/13 1315 05/15/13 1036   05/13/13 0600  vancomycin (VANCOCIN) 1,500 mg in sodium chloride 0.9 % 500 mL IVPB     1,500 mg 250 mL/hr over 120 Minutes Intravenous Every 12 hours 05/12/13 2301     05/12/13 1600  ceFAZolin (ANCEF) IVPB 1 g/50 mL premix  Status:  Discontinued     1 g 100 mL/hr over 30 Minutes Intravenous Every 8 hours 05/12/13 1508 05/14/13 1315   05/11/13 0600  piperacillin-tazobactam (ZOSYN) IVPB 3.375 g  Status:  Discontinued     3.375 g 12.5 mL/hr over 240 Minutes Intravenous Every 8 hours 05/10/13 2313 05/12/13 1456   05/10/13 2315  vancomycin (VANCOCIN) IVPB 1000 mg/200 mL premix  Status:  Discontinued     1,000 mg 200 mL/hr over 60 Minutes Intravenous Every 12 hours 05/10/13 2313 05/12/13 2300   05/10/13 2315  piperacillin-tazobactam (ZOSYN) IVPB 3.375 g     3.375 g 100 mL/hr over 30 Minutes Intravenous  Once 05/10/13 2313 05/11/13 0036      Assessment: 53 y/o male on vancomycin currently and to begin Unasyn for Proteus mirabilis, MRSA, and Enterococcus bacteremia - these are appropriate for sensitivities, ID also following. Renal function is normal. WBC are  elevated and he is afebrile. Repeat blood culture is negative thus far.  Vanc 5/8>> VT 11.2 adj for calc'd VT 18> 1.5g q12h Zosyn  5/8>5/9 Cefazolin 5/9>>5/12 Unasyn 5/12>>  5/7 UCx> ng 5/7 Bcx x2> Proteus mirabilis (pan sens), MRSA, Enterococcus (sens amp) 5/9 BCx>> ngtd   Goal of Therapy:  Vancomycin trough level 15-20 mcg/ml Eradication of infection  Plan:  - Vancomycin 1500 mg IV q12h - steady state level tonight - Unasyn 3 g IV q6h - Monitor renal function, clinical course, and culture data  [facility], Pharm.D., BCPS Clinical Pharmacist Pager: [phone] 05/15/2013 11:27 AM

## 2013-05-15 NOTE — Evaluation (Signed)
Occupational Therapy Evaluation Patient Details Name: [name] MRN: [id number] DOB: Jun 27, 1960 Today's Date: 05/15/2013    History of Present Illness Pt is a 53 y/o male admitted s/p amputation of right first ray and amputation of second digit on right. 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   Clinical Impression   Pt presents to OT with decreased I with ADL activity due to problems listed below, Pt will benefit from skilled OT to increase I with ADL activity and return to PLOF.    Follow Up Recommendations  No OT follow up    Equipment Recommendations  None recommended by OT       Precautions / Restrictions Restrictions RLE Weight Bearing: Non weight bearing      Mobility Bed Mobility Overal bed mobility: Needs Assistance Bed Mobility: Supine to Sit     Supine to sit: Supervision     General bed mobility comments: Incr time and use of bedrails; cues to initiate  Transfers Overall transfer level: Needs assistance Equipment used: Rolling walker (2 wheeled)   Sit to Stand: Min guard                   ADL Overall ADL's : Needs assistance/impaired     Grooming: Wash/dry face;Set up;Sitting   Upper Body Bathing: Set up;Sitting   Lower Body Bathing: Minimal assistance;Sit to/from stand   Upper Body Dressing : Set up;Sitting   Lower Body Dressing: Sit to/from stand;Minimal assistance   Toilet Transfer: Min Psychiatric nurse Details (indicate cue type and reason): sit to stand         Functional mobility during ADLs: Minimal assistance General ADL Comments: Pt very sweaty upon OT arrival , Pt agreed to full sponge bath in chair.  Pt provided with paper pants as he felt the gown was making him hot.  Pt able to don pants wit min A. Pt appreciative of paper pants to try due to gown making him hot         Hand Dominance Right      Communication Communication Communication: No  difficulties   Cognition Arousal/Alertness: Awake/alert Behavior During Therapy: WFL for tasks assessed/performed Overall Cognitive Status: Within Functional Limits for tasks assessed                     General Comments               Home Living Family/patient expects to be discharged to:: Private residence Living Arrangements: Alone Available Help at Discharge: Family;Available 24 hours/day Type of Home: House (Townhome) Home Access: Level entry     Home Layout: Two level;1/2 bath on main level Alternate Level Stairs-Number of Steps: 13   Bathroom Shower/Tub: Teacher, early years/pre: Handicapped height     Home Equipment: Paramedic - 2 wheels   Additional Comments: Sister staying with pt at d/c      Prior Functioning/Environment Level of Independence: Independent             OT Diagnosis: Generalized weakness   OT Problem List: Decreased strength   OT Treatment/Interventions: Self-care/ADL training;Patient/family education    OT Goals(Current goals can be found in the care plan section) Acute Rehab OT Goals OT Goal Formulation: With patient Time For Goal Achievement: 05/29/13 Potential to Achieve Goals: Good ADL Goals Pt Will Perform Grooming: with modified independence;standing Pt Will Perform Lower Body Dressing: with modified independence;sit to/from stand  Pt Will Transfer to Toilet: with modified independence;ambulating;regular height toilet Pt Will Perform Toileting - Clothing Manipulation and hygiene: with modified independence;sit to/from stand  OT Frequency: Min 2X/week    End of Session Equipment Utilized During Treatment: Surveyor, mining Communication: Mobility status  Activity Tolerance: Patient tolerated treatment well Patient left: in chair;with call bell/phone within reach   Time: 1023-1047 OT Time Calculation (min): 24 min Charges:  OT General Charges $OT Visit: 1 Procedure OT  Evaluation $Initial OT Evaluation Tier I: 1 Procedure OT Treatments $Self Care/Home Management : 8-22 mins G-Codes:    Betsy Pries May 24, 2013, 11:59 AM

## 2013-05-15 NOTE — Progress Notes (Signed)
Subjective:  Pt seen and examined in AM.  Pt with temperature 101.3 last night but reports no fever or chills. Able to ambulate with walker, worried about stairs at home.  Pt reports his pain is well controlled. Denies dyspnea, nausea, vomiting, change in BM or urination. Reports he is having good PO intake.   Objective: Vital signs in last 24 hours: Filed Vitals:   05/14/13 1605 05/14/13 2044 05/15/13 0500 05/15/13 0952  BP: 166/73 126/70 166/78 136/76  Pulse: 100 101 98 89  Temp:  98.7 F (37.1 C) 98.3 F (36.8 C) 99.3 F (37.4 C)  TempSrc:  Oral Oral Oral  Resp: 29 18 18 17   Height:      Weight:   90.8 kg (200 lb 2.8 oz)   SpO2: 93% 92% 94% 94%   Weight change: 1.763 kg (3 lb 14.2 oz)  Intake/Output Summary (Last 24 hours) at 05/15/13 1147 Last data filed at 05/15/13 0900  Gross per 24 hour  Intake 1421.33 ml  Output   1925 ml  Net -503.67 ml   Physical Exam:  General: NAD Heart: Regular rate and rhythm Lungs: Clear to ausculation b/l. 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 Neuro: A &O x 3  Lab Results: Basic Metabolic Panel:  Recent Labs Lab 05/14/13 0500 05/15/13 0500  NA 135* 136*  K 4.2 3.7  CL 98 97  CO2 25 27  GLUCOSE 300* 186*  BUN 13 11  CREATININE 1.03 0.98  CALCIUM 8.0* 8.1*   Liver Function Tests:  Recent Labs Lab 05/10/13 1322 05/10/13 1750  AST 52* 44*  ALT 48 46  ALKPHOS 214* 199*  BILITOT 1.3* 1.1  PROT 7.7 7.3  ALBUMIN 2.5* 2.3*    Recent Labs Lab 05/10/13 1322  LIPASE 11   CBC:  Recent Labs Lab 05/13/13 0545 05/14/13 0500 05/14/13 1718 05/15/13 0500  WBC 12.0* 10.7* 14.2* 15.5*  NEUTROABS 9.0* 7.6  --   --   HGB 8.1* 7.1* 8.4* 7.1*  HCT 24.8* 22.2* 26.0* 22.2*  MCV 84.9 85.7 85.8 85.7  PLT 337 341 371 385   BNP:  Recent Labs Lab 05/10/13 2324  PROBNP 2639.0*     CBG:  Recent Labs Lab 05/13/13 2234 05/14/13 0815 05/14/13 1209 05/14/13 1630 05/14/13 2138 05/15/13 0801  GLUCAP 375* 260* 274* 179* 204* 185*   Thyroid Function Tests:  Recent Labs Lab 05/11/13 0349  TSH 0.869    Urinalysis:  Recent Labs Lab 05/10/13 1916  COLORURINE YELLOW  LABSPEC 1.019  PHURINE 5.5  GLUCOSEU >1000*  HGBUR LARGE*  BILIRUBINUR NEGATIVE  KETONESUR 15*  PROTEINUR >300*  UROBILINOGEN 0.2  NITRITE NEGATIVE  LEUKOCYTESUR NEGATIVE     Micro Results: Recent Results (from the past 240 hour(s))  URINE CULTURE     Status: None   Collection Time    05/10/13  7:16 PM      Result Value Ref Range Status   Specimen Description URINE, RANDOM   Final   Special Requests NONE   Final   Culture  Setup Time     Final   Value: 05/11/2013 02:42     Performed at [facility]     Final   Value: NO GROWTH     Performed at [facility]   Culture     Final   Value:  NO GROWTH     Performed at [facility]   Report Status 05/11/2013 FINAL   Final  CULTURE, BLOOD (ROUTINE X 2)     Status: None   Collection Time    05/10/13 11:24 PM      Result Value Ref Range Status   Specimen Description BLOOD RIGHT FOREARM   Final   Special Requests BOTTLES DRAWN AEROBIC AND ANAEROBIC 10ML   Final   Culture  Setup Time     Final   Value: 05/11/2013 03:52     Performed at [facility]   Culture     Final   Value: PROTEUS MIRABILIS     ENTEROCOCCUS SPECIES     STAPHYLOCOCCUS AUREUS     Note: Gram Stain Report Called to,Read Back By and Verified With: DARA ANDERSON ON 05/11/2013 AT 6:32P BY Dennard Nip     Performed at [facility]   Report Status 05/15/2013 FINAL   Final   Organism ID, Bacteria PROTEUS MIRABILIS   Final  CULTURE, BLOOD (ROUTINE X 2)     Status: None   Collection Time    05/10/13 11:27 PM      Result Value Ref Range Status   Specimen Description BLOOD RIGHT FOREARM   Final   Special Requests BOTTLES  DRAWN AEROBIC AND ANAEROBIC 10ML   Final   Culture  Setup Time     Final   Value: 05/11/2013 03:51     Performed at [facility]   Culture     Final   Value: PROTEUS MIRABILIS     Note: SUSCEPTIBILITIES PERFORMED ON PREVIOUS CULTURE WITHIN THE LAST 5 DAYS.     ENTEROCOCCUS SPECIES     METHICILLIN RESISTANT STAPHYLOCOCCUS AUREUS     Note: RIFAMPIN AND GENTAMICIN SHOULD NOT BE USED AS SINGLE DRUGS FOR TREATMENT OF STAPH INFECTIONS. CRITICAL RESULT CALLED TO, READ BACK BY AND VERIFIED WITH: KATHA H@2 :37PM ON 05/14/13 BY DANTS   Report Status 05/15/2013 FINAL   Final   Organism ID, Bacteria ENTEROCOCCUS SPECIES   Final   Organism ID, Bacteria METHICILLIN RESISTANT STAPHYLOCOCCUS AUREUS   Final  MRSA PCR SCREENING     Status: None   Collection Time    05/11/13  1:45 AM      Result Value Ref Range Status   MRSA by PCR NEGATIVE  NEGATIVE Final   Comment:            The GeneXpert MRSA Assay (FDA     approved for NASAL specimens     only), is one component of a     comprehensive MRSA colonization     surveillance program. It is not     intended to diagnose MRSA     infection nor to guide or     monitor treatment for     MRSA infections.  CULTURE, BLOOD (ROUTINE X 2)     Status: None   Collection Time    05/12/13  1:15 PM      Result Value Ref Range Status   Specimen Description BLOOD LEFT HAND   Final   Special Requests BOTTLES DRAWN AEROBIC AND ANAEROBIC 10CC   Final   Culture  Setup Time     Final   Value: 05/12/2013 17:38     Performed at [facility]   Culture     Final   Value:        BLOOD CULTURE RECEIVED NO GROWTH TO DATE CULTURE WILL BE HELD FOR  5 DAYS BEFORE ISSUING A FINAL NEGATIVE REPORT     Performed at [facility]   Report Status PENDING   Incomplete   Studies/Results: No results found. Medications: I have reviewed the patient's current medications. Scheduled Meds: . amLODipine  5 mg Oral Daily  . ampicillin-sulbactam (UNASYN) IV  3 g  Intravenous Q6H  . aspirin EC  325 mg Oral Daily  . atorvastatin  10 mg Oral q1800  . carvedilol  3.125 mg Oral BID WC  . dextromethorphan-guaiFENesin  1 tablet Oral BID  . furosemide  20 mg Oral BID  . gabapentin  600 mg Oral TID  . heparin  5,000 Units Subcutaneous 3 times per day  . insulin aspart  0-20 Units Subcutaneous TID WC  . insulin aspart  0-5 Units Subcutaneous QHS  . insulin glargine  70 Units Subcutaneous QHS  . sodium chloride  10-40 mL Intracatheter Q12H  . sodium chloride  3 mL Intravenous Q12H  . vancomycin  1,500 mg Intravenous Q12H   Continuous Infusions: . 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 on 5/8 - fever 101.3 overnight with WBC increase 14.2 to 15.5.  -Appreciate orthopedic surgery following  -Continue IV vancomycin, change ancef to unsayn (Day 5 of 6 week therapy) -Monitor CBC and fever curve  -Percocet/Roxicet 1-2 tbs Q 4hr PRN -Gabapentin 600 mg TID  -PT/OT therapies --> home health PT  -Non-weight bearing to right LE -SQ heparin TID & aspirin 325 mg daily for DVT Ppx  Proteus Mirabilis, MRSA,  & Enterococcus Bacteremia - Pt with blood cultures (2/2) from 5/7 with pansensitive  Proteus Mirabilis, MRSA, and pansensitive Enterococcus. TEE on 5/11 with no vegetations.     -Appreciate ID consult  -F/U repeat blood cultures 5/9 --> NGTD  -Continue IV vancomycin, change ancef to unsayn (Day 5 of 6 week therapy) -[facility] for home IV antibiotics   Acute on Chronic Normocytic Anemia - Hg below baseline 10 with no active bleeding or hemodynamic instability.  -Transfuse if Hg<7 -Monitor CBC -Obtain anemia panel  -Obtain FOBT  -Outpatient screening colonoscopy   Insulin-dependent Type II DM - A1c 11.5 on  05/12/13. Pt at home on metformin 1000 BID,  Lantus 70 U, and Novolog 27 U TID.  -Appreciate diabetes coordinator recommendations -Continue home Lantus 70 U  -Resistant SSI with meals & bedtime -Monitor CBG with meals & bedtime   Hypertension - Currently hypertensive.  -Continue home furosemide 20 mg BID -Continue home carvedilol 3.125 mg BID -Start amlodipine 5 mg daily  -ACEi/ARB with SE cough   Hyponatremia - Improved. Na 136.  Etiology likely due to hypovolemia and hyperglycemia.  -NS bolus as needed  -Monitor BMP -Encourage adequate hydration   Grade 1 Diastolic Chronic Heart Failure - Possibly mild exacerbation on admission. Last 2D-echo on 03/28/13 with EF 55-60% and grade 1 dysfunction. Pro-BNP 2639 on admission. -Monitor volume status -Daily weight and strict I & O's  Hyperlipidemia - Last lipid panel on 05/12/11 with cholesterol 206, TG 100, HDL 40, LDL 146. Pt at home on crestor 5 mg daily. -Atorvastatin 10 mg daily   Anion Gap Metabolic Acidosis - Resolved. AG 12. Most likely due to severe sepsis.  -Monitor BMP  AKI - Resolved. Cr near baseline of 1. Etiology most likely pre-renal azotemia with  Fe Urea 51.7 %.  -Monitor BMP   Acute Respiratory Failure in setting of pulmonary edema - Resolved. Etiology possibly due to acute on chronic diastolic CHF. -Oxygen supplementation to keep SpO2 >92% -Albuterol nebulizer Q 6 hr PRN -Continue IV antibiotics to treat for possible HCAP/aspiration PNA -Incentive spirometry  Diet: Carb modified DVT Ppx: SQ Heparin TID, ASA 325 mg  Code: Full  Dispo: Disposition is deferred at this time, awaiting improvement of current medical problems.    The patient does have a current PCP (Neema Bobbie Stack, MD) and does need an [facility] hospital follow-up appointment after discharge.  The patient does not have transportation limitations that hinder transportation to clinic appointments.  .Services Needed at time of discharge: Y = Yes, Blank = No PT:   Yes  OT:  No   RN:  Yes  Equipment:  3-in-1; tub bench, wheelchair cushion  Other:  IV antibiotics     LOS: 5 days   Juluis Mire, MD 05/15/2013, 11:47 AM

## 2013-05-15 NOTE — Progress Notes (Signed)
Physical Therapy Treatment Patient Details Name: [name] MRN: [id number] DOB: August 23, 1960 Today's Date: 05/15/2013    History of Present Illness Pt is a 53 y/o male admitted s/p amputation of right first ray and amputation of second digit on right. 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    PT Comments    Continuing progress with functional mobility, and pt is able to demonstrate therex and describe use of ankle weights once he is home; He also describes a sound and safe way to ascend and descend steps in his home  Follow Up Recommendations  Home health PT     Equipment Recommendations  3in1 (PT);Rolling walker with 5" wheels (will defer possibility of tub bench to OT)    Recommendations for Other Services       Precautions / Restrictions Precautions Precautions: Fall Precaution Comments: L midfoot amputation previously Restrictions RLE Weight Bearing: Non weight bearing    Mobility  Bed Mobility Overal bed mobility: Needs Assistance Bed Mobility: Supine to Sit     Supine to sit: Supervision     General bed mobility comments: Incr time and use of bedrails; cues to initiate  Transfers Overall transfer level: Needs assistance Equipment used: Rolling walker (2 wheeled) Transfers: Sit to/from Stand Sit to Stand: Min guard         General transfer comment: Cues for safety, hand placement, and to avoid touching RLE to floor; minimal difficulty keeping NWB; noted decr control with stand to sit  Ambulation/Gait Ambulation/Gait assistance: Min guard Ambulation Distance (Feet): 28 Feet Assistive device: Rolling walker (2 wheeled) Gait Pattern/deviations: Step-to pattern     General Gait Details: Quite good maintenance of NWB during gait; noted quite fatigued at end of walk; Able to more press body weight into RW for advancement of L foot rather than 'hopping"   Stairs Stairs:  (Plans to "bump" up steps to get  to his second floor -- a sound, safe way to access second floor)          Wheelchair Mobility    Modified Rankin (Stroke Patients Only)       Balance                                    Cognition Arousal/Alertness: Awake/alert Behavior During Therapy: WFL for tasks assessed/performed Overall Cognitive Status: Within Functional Limits for tasks assessed                      Exercises General Exercises - Lower Extremity Ankle Circles/Pumps: AROM;Both;10 reps Quad Sets: AROM;Both;10 reps Hip ABduction/ADduction: AROM;Both;10 reps;Other (comment) (Isometric abd and add in chair)    General Comments        Pertinent Vitals/Pain Reports no pain when asked elevated for edema and pain control     Home Living Family/patient expects to be discharged to:: Private residence Living Arrangements: Alone Available Help at Discharge: Family;Available 24 hours/day Type of Home: House (Townhome) Home Access: Level entry   Home Layout: Two level;1/2 bath on main level Home Equipment: Wheelchair - manual;Crutches;Walker - 2 wheels Additional Comments: Sister staying with pt at d/c    Prior Function Level of Independence: Independent          PT Goals (current goals can now be found in the care plan section) Acute Rehab PT Goals Patient Stated Goal: To return home PT Goal  Formulation: With patient Time For Goal Achievement: 05/25/13 Potential to Achieve Goals: Good Progress towards PT goals: Progressing toward goals    Frequency  Min 3X/week    PT Plan Current plan remains appropriate    Co-evaluation             End of Session Equipment Utilized During Treatment: Gait belt Activity Tolerance: Patient tolerated treatment well Patient left: in chair;with call bell/phone within reach     Time: 1220-1250 PT Time Calculation (min): 30 min  Charges:  $Gait Training: 8-22 mins $Therapeutic Exercise: 8-22 mins                    G Codes:       [facility] [place] 05/15/2013, 3:53 PM  Roney Marion, [place] Pager [phone] Office [phone]

## 2013-05-16 LAB — IRON AND TIBC
IRON: 15 ug/dL — AB (ref 42–135)
SATURATION RATIOS: 9 % — AB (ref 20–55)
TIBC: 159 ug/dL — ABNORMAL LOW (ref 215–435)
UIBC: 144 ug/dL (ref 125–400)

## 2013-05-16 LAB — BASIC METABOLIC PANEL
BUN: 10 mg/dL (ref 6–23)
CALCIUM: 8.3 mg/dL — AB (ref 8.4–10.5)
CHLORIDE: 98 meq/L (ref 96–112)
CO2: 28 meq/L (ref 19–32)
CREATININE: 0.87 mg/dL (ref 0.50–1.35)
GFR calc Af Amer: >90 mL/min (ref >90)
GFR calc non Af Amer: >90 mL/min (ref >90)
Glucose, Bld: 176 mg/dL — ABNORMAL HIGH (ref 70–99)
Potassium: 4 mEq/L (ref 3.7–5.3)
Sodium: 136 mEq/L — ABNORMAL LOW (ref 137–147)

## 2013-05-16 LAB — RETICULOCYTES
RBC.: 2.66 MIL/uL — ABNORMAL LOW (ref 4.22–5.81)
RETIC COUNT ABSOLUTE: 61.2 10*3/uL (ref 19.0–186.0)
Retic Ct Pct: 2.3 % (ref 0.4–3.1)

## 2013-05-16 LAB — CBC WITH DIFFERENTIAL/PLATELET
BASOS PCT: 0 % (ref 0–1)
Basophils Absolute: 0 10*3/uL (ref 0.0–0.1)
EOS ABS: 0.1 10*3/uL (ref 0.0–0.7)
Eosinophils Relative: 1 % (ref 0–5)
HEMATOCRIT: 22.8 % — AB (ref 39.0–52.0)
HEMOGLOBIN: 7.3 g/dL — AB (ref 13.0–17.0)
LYMPHS ABS: 1.9 10*3/uL (ref 0.7–4.0)
Lymphocytes Relative: 16 % (ref 12–46)
MCH: 27.4 pg (ref 26.0–34.0)
MCHC: 32 g/dL (ref 30.0–36.0)
MCV: 85.7 fL (ref 78.0–100.0)
MONO ABS: 0.7 10*3/uL (ref 0.1–1.0)
Monocytes Relative: 6 % (ref 3–12)
NEUTROS ABS: 8.8 10*3/uL — AB (ref 1.7–7.7)
NEUTROS PCT: 76 % (ref 43–77)
Platelets: 348 10*3/uL (ref 150–400)
RBC: 2.66 MIL/uL — AB (ref 4.22–5.81)
RDW: 14.8 % (ref 11.5–15.5)
WBC: 11.2 10*3/uL — ABNORMAL HIGH (ref 4.0–10.5)

## 2013-05-16 LAB — VITAMIN B12: Vitamin B-12: 430 pg/mL (ref 211–911)

## 2013-05-16 LAB — GLUCOSE, CAPILLARY
GLUCOSE-CAPILLARY: 190 mg/dL — AB (ref 70–99)
Glucose-Capillary: 165 mg/dL — ABNORMAL HIGH (ref 70–99)
Glucose-Capillary: 134 mg/dL — ABNORMAL HIGH (ref 70–99)
Glucose-Capillary: 123 mg/dL — ABNORMAL HIGH (ref 70–99)

## 2013-05-16 LAB — FERRITIN: FERRITIN: 394 ng/mL — AB (ref 22–322)

## 2013-05-16 LAB — FOLATE: Folate: 9 ng/mL

## 2013-05-16 MED ORDER — MAGNESIUM HYDROXIDE 400 MG/5ML PO SUSP
30.0000 mL | Freq: Every day | ORAL | Status: DC | PRN
  Administered 2013-05-16 – 2013-05-20 (×3): 30 mL via ORAL
  Filled 2013-05-16 (×3): qty 30

## 2013-05-16 MED ORDER — BENZONATATE 100 MG PO CAPS
100.0000 mg | ORAL_CAPSULE | Freq: Three times a day (TID) | ORAL | Status: DC | PRN
  Administered 2013-05-16: 100 mg via ORAL
  Filled 2013-05-16 (×2): qty 1

## 2013-05-16 NOTE — Progress Notes (Signed)
Patient ID: [name], male   DOB: 27-Nov-1960, 53 y.o.   MRN: [id number] Patient is status post right first and second ray amputation for abscess and gangrenous changes. The dressing is changed today. Examination shows some mild maceration no purulence no necrotic tissue no drainage no signs of infection. Sterile dressing was applied. Patient is to continue nonweightbearing on the right lower extremity.

## 2013-05-16 NOTE — Progress Notes (Signed)
Inpatient Diabetes Program Recommendations  AACE/ADA: New Consensus Statement on Inpatient Glycemic Control (2013)  Target Ranges:  Prepandial:   less than 140 mg/dL      Peak postprandial:   less than 180 mg/dL (1-2 hours)      Critically ill patients:  140 - 180 mg/dL   Results for JHAI, LOVEGROVE (MRN [id number]) as of 05/16/2013 08:05  Ref. Range 05/15/2013 08:01 05/15/2013 11:59 05/15/2013 17:16 05/15/2013 22:11  Glucose-Capillary Latest Range: 70-99 mg/dL 185 (H) 163 (H) 194 (H) 294 (H)   Current orders for Inpatient glycemic control: Lantus 70 units QHS, Novolog 0-20 units AC, Novolog 0-5 units HS  Inpatient Diabetes Program Recommendations Insulin - Meal Coverage: Please consider ordering Novolog 5 units TID with meals.  Thanks, Barnie Alderman, RN, MSN, CCRN Diabetes Coordinator Inpatient Diabetes Program [phone] (Team Pager) [phone] ([facility] office) [phone] [facility] office)

## 2013-05-16 NOTE — Progress Notes (Signed)
UR completed 

## 2013-05-16 NOTE — Progress Notes (Signed)
Physical Therapy Treatment Patient Details Name: [name] MRN: [id number] DOB: 26-Feb-1960 Today's Date: 05/16/2013    History of Present Illness Pt is a 53 y/o male admitted s/p amputation of right first ray and amputation of second digit on right. 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    PT Comments    Still willing to work, but reports not feeling as well today; Blood sugar was checked: 123 Very motivated, and should do well; Consider bringing crutches next session  Follow Up Recommendations  Home health PT     Equipment Recommendations  3in1 (PT);Rolling walker with 5" wheels    Recommendations for Other Services OT consult     Precautions / Restrictions Precautions Precautions: Fall Precaution Comments: L midfoot amputation previously Restrictions RLE Weight Bearing: Non weight bearing    Mobility  Bed Mobility Overal bed mobility: Modified Independent Bed Mobility: Supine to Sit     Supine to sit: Modified independent (Device/Increase time)     General bed mobility comments: Use of bedrails  Transfers Overall transfer level: Needs assistance Equipment used: Rolling walker (2 wheeled) Transfers: Sit to/from Stand Sit to Stand: Min guard         General transfer comment: Cues for safety, hand placement, and to avoid touching RLE to floor; minimal difficulty keeping NWB; noted decr control with stand to sit  Ambulation/Gait Ambulation/Gait assistance: Supervision Ambulation Distance (Feet): 24 Feet Assistive device: Rolling walker (2 wheeled) Gait Pattern/deviations: Step-to pattern     General Gait Details: Quite good maintenance of NWB during gait; noted quite fatigued at end of walk; Able to more press body weight into RW for advancement of L foot rather than 'hopping"   Stairs            Wheelchair Mobility    Modified Rankin (Stroke Patients Only)       Balance              Standing balance-Leahy Scale: Poor                      Cognition Arousal/Alertness: Awake/alert Behavior During Therapy: WFL for tasks assessed/performed Overall Cognitive Status: Within Functional Limits for tasks assessed                      Exercises      General Comments General comments (skin integrity, edema, etc.): Reported felt like his blood sugar was low,       Pertinent Vitals/Pain no apparent distress Denies pain when asked    Home Living                      Prior Function            PT Goals (current goals can now be found in the care plan section) Acute Rehab PT Goals Patient Stated Goal: To return home PT Goal Formulation: With patient Time For Goal Achievement: 05/25/13 Potential to Achieve Goals: Good Progress towards PT goals: Progressing toward goals    Frequency  Min 3X/week    PT Plan Current plan remains appropriate    Co-evaluation             End of Session Equipment Utilized During Treatment: Gait belt Activity Tolerance: Patient tolerated treatment well Patient left: in bed;with call bell/phone within reach;Other (comment) (sitting EOB setup for bath)     Time: 1510-1530 PT Time Calculation (min): 20  min  Charges:  $Gait Training: 8-22 mins                    G Codes:      Nuala Chiles [place] 05/16/2013, 4:20 PM  Roney [place], [place] Pager [phone] Office [phone]

## 2013-05-16 NOTE — Progress Notes (Signed)
Patient requesting tessalon pearls for cough and Milk of Magnesia for constipation.  MD notified.

## 2013-05-16 NOTE — Progress Notes (Signed)
Subjective:  Pt seen and examined in AM.  No acute events overnight. Denies fever or chills.  Reports his pain is controlled and able to ambulate with PT.    Objective: Vital signs in last 24 hours: Filed Vitals:   05/15/13 2243 05/16/13 0509 05/16/13 0839 05/16/13 1527  BP: 147/81 91/61 136/75 152/76  Pulse: 85 70 90 91  Temp: 98.2 F (36.8 C) 98.2 F (36.8 C) 98.5 F (36.9 C) 98.1 F (36.7 C)  TempSrc: Oral Oral Oral Oral  Resp: 20 18 19 18   Height:      Weight:      SpO2: 99% 99% 94% 96%   Weight change: -0.5 kg (-1 lb 1.6 oz)  Intake/Output Summary (Last 24 hours) at 05/16/13 1624 Last data filed at 05/16/13 1528  Gross per 24 hour  Intake   1480 ml  Output   4080 ml  Net  -2600 ml   Physical Exam:  General: NAD Heart: Regular rate and rhythm Lungs: Clear to ausculation b/l. 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 Neuro: A &O x 3  Lab Results: Basic Metabolic Panel:  Recent Labs Lab 05/15/13 0500 05/16/13 0535  NA 136* 136*  K 3.7 4.0  CL 97 98  CO2 27 28  GLUCOSE 186* 176*  BUN 11 10  CREATININE 0.98 0.87  CALCIUM 8.1* 8.3*   Liver Function Tests:  Recent Labs Lab 05/10/13 1322 05/10/13 1750  AST 52* 44*  ALT 48 46  ALKPHOS 214* 199*  BILITOT 1.3* 1.1  PROT 7.7 7.3  ALBUMIN 2.5* 2.3*    Recent Labs Lab 05/10/13 1322  LIPASE 11   CBC:  Recent Labs Lab 05/14/13 0500  05/15/13 0500 05/16/13 0535  WBC 10.7*  < > 15.5* 11.2*  NEUTROABS 7.6  --   --  8.8*  HGB 7.1*  < > 7.1* 7.3*  HCT 22.2*  < > 22.2* 22.8*  MCV 85.7  < > 85.7 85.7  PLT 341  < > 385 348  < > = values in this interval not displayed. BNP:  Recent Labs Lab 05/10/13 2324  PROBNP 2639.0*   CBG:  Recent Labs Lab 05/15/13 0801 05/15/13 1159 05/15/13 1716 05/15/13 2211 05/16/13 0836 05/16/13 1208  GLUCAP 185*  163* 194* 294* 165* 134*   Thyroid Function Tests:  Recent Labs Lab 05/11/13 0349  TSH 0.869    Urinalysis:  Recent Labs Lab 05/10/13 1916  COLORURINE YELLOW  LABSPEC 1.019  PHURINE 5.5  GLUCOSEU >1000*  HGBUR LARGE*  BILIRUBINUR NEGATIVE  KETONESUR 15*  PROTEINUR >300*  UROBILINOGEN 0.2  NITRITE NEGATIVE  LEUKOCYTESUR NEGATIVE     Micro Results: Recent Results (from the past 240 hour(s))  URINE CULTURE     Status: None   Collection Time    05/10/13  7:16 PM      Result Value Ref Range Status   Specimen Description URINE, RANDOM   Final   Special Requests NONE   Final   Culture  Setup Time     Final   Value: 05/11/2013 02:42     Performed at [place]     Final   Value: NO GROWTH     Performed at [facility]   Culture     Final   Value: NO GROWTH     Performed  at [facility]   Report Status 05/11/2013 FINAL   Final  CULTURE, BLOOD (ROUTINE X 2)     Status: None   Collection Time    05/10/13 11:24 PM      Result Value Ref Range Status   Specimen Description BLOOD RIGHT FOREARM   Final   Special Requests BOTTLES DRAWN AEROBIC AND ANAEROBIC 10ML   Final   Culture  Setup Time     Final   Value: 05/11/2013 03:52     Performed at [facility]   Culture     Final   Value: PROTEUS MIRABILIS     ENTEROCOCCUS SPECIES     Note: SUSCEPTIBILITIES PERFORMED ON PREVIOUS CULTURE WITHIN THE LAST 5 DAYS.     STAPHYLOCOCCUS AUREUS     Note: Susceptibilities performed on previous culture within the last 5 days.   Report Status 05/15/2013 FINAL   Final   Organism ID, Bacteria PROTEUS MIRABILIS   Final  CULTURE, BLOOD (ROUTINE X 2)     Status: None   Collection Time    05/10/13 11:27 PM      Result Value Ref Range Status   Specimen Description BLOOD RIGHT FOREARM   Final   Special Requests BOTTLES DRAWN AEROBIC AND ANAEROBIC 10ML   Final   Culture  Setup Time     Final   Value: 05/11/2013 03:51     Performed at  [facility]   Culture     Final   Value: PROTEUS MIRABILIS     Note: SUSCEPTIBILITIES PERFORMED ON PREVIOUS CULTURE WITHIN THE LAST 5 DAYS.     ENTEROCOCCUS SPECIES     METHICILLIN RESISTANT STAPHYLOCOCCUS AUREUS     Note: RIFAMPIN AND GENTAMICIN SHOULD NOT BE USED AS SINGLE DRUGS FOR TREATMENT OF STAPH INFECTIONS. CRITICAL RESULT CALLED TO, READ BACK BY AND VERIFIED WITH: KATHA H@2 :37PM ON 05/14/13 BY DANTS   Report Status 05/15/2013 FINAL   Final   Organism ID, Bacteria ENTEROCOCCUS SPECIES   Final   Organism ID, Bacteria METHICILLIN RESISTANT STAPHYLOCOCCUS AUREUS   Final  MRSA PCR SCREENING     Status: None   Collection Time    05/11/13  1:45 AM      Result Value Ref Range Status   MRSA by PCR NEGATIVE  NEGATIVE Final   Comment:            The GeneXpert MRSA Assay (FDA     approved for NASAL specimens     only), is one component of a     comprehensive MRSA colonization     surveillance program. It is not     intended to diagnose MRSA     infection nor to guide or     monitor treatment for     MRSA infections.  CULTURE, BLOOD (ROUTINE X 2)     Status: None   Collection Time    05/12/13  1:15 PM      Result Value Ref Range Status   Specimen Description BLOOD LEFT HAND   Final   Special Requests BOTTLES DRAWN AEROBIC AND ANAEROBIC 10CC   Final   Culture  Setup Time     Final   Value: 05/12/2013 17:38     Performed at [facility]   Culture     Final   Value:        BLOOD CULTURE RECEIVED NO GROWTH TO DATE CULTURE WILL BE HELD FOR 5 DAYS BEFORE ISSUING A FINAL NEGATIVE REPORT  Performed at [facility]   Report Status PENDING   Incomplete   Studies/Results: No results found. Medications: I have reviewed the patient's current medications. Scheduled Meds: . amLODipine  5 mg Oral Daily  . ampicillin-sulbactam (UNASYN) IV  3 g Intravenous Q6H  . aspirin EC  325 mg Oral Daily  . atorvastatin  10 mg Oral q1800  . carvedilol  3.125 mg Oral BID WC  .  dextromethorphan-guaiFENesin  1 tablet Oral BID  . furosemide  20 mg Oral BID  . gabapentin  600 mg Oral TID  . heparin  5,000 Units Subcutaneous 3 times per day  . insulin aspart  0-20 Units Subcutaneous TID WC  . insulin aspart  0-5 Units Subcutaneous QHS  . insulin glargine  70 Units Subcutaneous QHS  . sodium chloride  10-40 mL Intracatheter Q12H  . sodium chloride  3 mL Intravenous Q12H  . vancomycin  1,000 mg Intravenous Q8H   Continuous Infusions: . 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 on 5/8 - no current sepsis criteria, per ortho mild maceration with no signs of infection or necrotic tissue -Appreciate orthopedic surgery following  -Continue IV vancomycin and unsayn (Day 6/21 total day therapy) -Monitor CBC and fever curve  -Percocet/Roxicet 1-2 tbs Q 4hr PRN -Gabapentin 600 mg TID  -PT/OT therapies --> home health PT  -Non-weight bearing to right LE -SQ heparin TID & aspirin 325 mg daily for DVT Ppx -Orthopedics outpatient follow-up  Proteus Mirabilis, MRSA, & Enterococcus Bacteremia - Pt with blood cultures (2/2) from 5/7 with pansensitive  Proteus Mirabilis, MRSA, and pansensitive Enterococcus. TEE on 5/11 with no vegetations.     -Appreciate ID consult  -F/U repeat blood cultures 5/9 --> NGTD  -Continue IV vancomycin and unsayn (Day 6/21 total day therapy) -[facility] for home IV antibiotics  -Outpatient ID follow-up  Acute on Chronic Normocytic Anemia - Hg below baseline 10 with no active bleeding or hemodynamic instability.  -Transfuse if Hg<7 -Monitor CBC -Obtain anemia panel 5/13 ---> reticulocytes (wnl), ferritin(394), iron (15 L), TIBC (159 L), Sat% (9L), folate (9), B12 (430) -Obtain FOBT  -Outpatient  screening colonoscopy   Insulin-dependent Type II DM - A1c 11.5 on 05/12/13. Pt at home on metformin 1000 BID,  Lantus 70 U, and Novolog 27 U TID.  -Appreciate diabetes coordinator recommendations -Continue home Lantus 70 U  -Resistant SSI with meals & bedtime -Monitor CBG with meals & bedtime   Hypertension - Currently hypertensive.  -Continue home furosemide 20 mg BID -Continue home carvedilol 3.125 mg BID -Start amlodipine 5 mg daily  -ACEi/ARB with SE cough   Hyponatremia - Improved. Na 136.  Etiology likely due to hypovolemia and hyperglycemia.  -NS bolus as needed  -Monitor BMP -Encourage adequate hydration   Grade 1 Diastolic Chronic Heart Failure - Possibly mild exacerbation on admission. Last 2D-echo on 03/28/13 with EF 55-60% and grade 1 dysfunction. Pro-BNP 2639 on admission. -Monitor volume status -Daily weight and strict I & O's  Hyperlipidemia - Last lipid panel on 05/12/11 with cholesterol 206, TG 100, HDL 40, LDL 146. Pt at home on crestor 5 mg daily. -Atorvastatin 10 mg daily   Anion Gap Metabolic Acidosis - Resolved. AG 12. Most likely due to severe sepsis.  -Monitor BMP  AKI - Resolved. Cr near  baseline of 1. Etiology most likely pre-renal azotemia with Fe Urea 51.7 %.  -Monitor BMP   Acute Respiratory Failure in setting of pulmonary edema - Resolved. Etiology possibly due to acute on chronic diastolic CHF. -Oxygen supplementation to keep SpO2 >92% -Albuterol nebulizer Q 6 hr PRN -Continue IV antibiotics to treat for possible HCAP/aspiration PNA -Incentive spirometry  Diet: Carb modified DVT Ppx: SQ Heparin TID, ASA 325 mg  Code: Full  Dispo: Disposition is deferred at this time, awaiting improvement of current medical problems.    The patient does have a current PCP (Neema Bobbie Stack, MD) and does need an [facility] hospital follow-up appointment after discharge.  The patient does not have transportation limitations that hinder transportation to clinic  appointments.  .Services Needed at time of discharge: Y = Yes, Blank = No PT:  Yes  OT:  No   RN:  Yes  Equipment:  3-in-1; tub bench, wheelchair cushion  Other:  IV antibiotics     LOS: 6 days   Juluis Mire, MD 05/16/2013, 4:24 PM

## 2013-05-17 DIAGNOSIS — M86179 Other acute osteomyelitis, unspecified ankle and foot: Secondary | ICD-10-CM

## 2013-05-17 DIAGNOSIS — D539 Nutritional anemia, unspecified: Secondary | ICD-10-CM | POA: Diagnosis present

## 2013-05-17 LAB — CBC
HCT: 20.5 % — ABNORMAL LOW (ref 39.0–52.0)
HCT: 20.4 % — ABNORMAL LOW (ref 39.0–52.0)
HEMATOCRIT: 22 % — AB (ref 39.0–52.0)
HEMOGLOBIN: 6.9 g/dL — AB (ref 13.0–17.0)
Hemoglobin: 6.3 g/dL — CL (ref 13.0–17.0)
Hemoglobin: 6.5 g/dL — CL (ref 13.0–17.0)
MCH: 26.9 pg (ref 26.0–34.0)
MCH: 27.9 pg (ref 26.0–34.0)
MCH: 27.2 pg (ref 26.0–34.0)
MCHC: 30.7 g/dL (ref 30.0–36.0)
MCHC: 31.9 g/dL (ref 30.0–36.0)
MCHC: 31.4 g/dL (ref 30.0–36.0)
MCV: 87.6 fL (ref 78.0–100.0)
MCV: 87.6 fL (ref 78.0–100.0)
MCV: 86.6 fL (ref 78.0–100.0)
Platelets: 389 10*3/uL (ref 150–400)
Platelets: 347 10*3/uL (ref 150–400)
Platelets: 396 10*3/uL (ref 150–400)
RBC: 2.34 MIL/uL — AB (ref 4.22–5.81)
RBC: 2.33 MIL/uL — AB (ref 4.22–5.81)
RBC: 2.54 MIL/uL — AB (ref 4.22–5.81)
RDW: 15.3 % (ref 11.5–15.5)
RDW: 15.2 % (ref 11.5–15.5)
RDW: 15.1 % (ref 11.5–15.5)
WBC: 11.4 10*3/uL — ABNORMAL HIGH (ref 4.0–10.5)
WBC: 10.2 10*3/uL (ref 4.0–10.5)
WBC: 11.7 10*3/uL — ABNORMAL HIGH (ref 4.0–10.5)

## 2013-05-17 LAB — BASIC METABOLIC PANEL
BUN: 11 mg/dL (ref 6–23)
CALCIUM: 8.5 mg/dL (ref 8.4–10.5)
CO2: 29 meq/L (ref 19–32)
Chloride: 99 mEq/L (ref 96–112)
Creatinine, Ser: 0.93 mg/dL (ref 0.50–1.35)
GFR calc Af Amer: >90 mL/min (ref >90)
GFR calc non Af Amer: >90 mL/min (ref >90)
GLUCOSE: 188 mg/dL — AB (ref 70–99)
Potassium: 4.1 mEq/L (ref 3.7–5.3)
Sodium: 138 mEq/L (ref 137–147)

## 2013-05-17 LAB — GLUCOSE, CAPILLARY
GLUCOSE-CAPILLARY: 170 mg/dL — AB (ref 70–99)
Glucose-Capillary: 186 mg/dL — ABNORMAL HIGH (ref 70–99)
Glucose-Capillary: 141 mg/dL — ABNORMAL HIGH (ref 70–99)
Glucose-Capillary: 191 mg/dL — ABNORMAL HIGH (ref 70–99)

## 2013-05-17 LAB — ABO/RH: ABO/RH(D): A POS

## 2013-05-17 LAB — VANCOMYCIN, TROUGH: Vancomycin Tr: 25 ug/mL — ABNORMAL HIGH (ref 10.0–20.0)

## 2013-05-17 LAB — PREPARE RBC (CROSSMATCH)

## 2013-05-17 MED ORDER — INSULIN ASPART 100 UNIT/ML ~~LOC~~ SOLN
5.0000 [IU] | Freq: Three times a day (TID) | SUBCUTANEOUS | Status: DC
  Administered 2013-05-17 – 2013-05-18 (×6): 5 [IU] via SUBCUTANEOUS

## 2013-05-17 MED ORDER — VANCOMYCIN HCL IN DEXTROSE 750-5 MG/150ML-% IV SOLN
750.0000 mg | Freq: Three times a day (TID) | INTRAVENOUS | Status: DC
  Administered 2013-05-17 – 2013-05-21 (×11): 750 mg via INTRAVENOUS
  Filled 2013-05-17 (×16): qty 150

## 2013-05-17 MED ORDER — SODIUM CHLORIDE 0.9 % IV SOLN: 3.0000 g | Freq: Four times a day (QID) | INTRAVENOUS | Status: AC

## 2013-05-17 MED ORDER — VANCOMYCIN HCL IN DEXTROSE 750-5 MG/150ML-% IV SOLN: 750.0000 mg | Freq: Three times a day (TID) | INTRAVENOUS | Status: AC

## 2013-05-17 NOTE — Progress Notes (Signed)
Paged IV team for CBC lab draw.

## 2013-05-17 NOTE — Progress Notes (Signed)
Vanc trough 25

## 2013-05-17 NOTE — Progress Notes (Signed)
CRITICAL VALUE ALERT  Critical value received:  hgb 6.3  Date of notification:  05/17/13  Time of notification:  0651  Critical value read back: yes  Nurse who received alert:  Doreene Burke  MD notified (1st page):    Time of first page:  [phone]  MD notified (2nd page):  Time of second page:  Responding MD:  Dr. Heber [place]  Time MD responded: [phone]

## 2013-05-17 NOTE — Progress Notes (Addendum)
ANTIBIOTIC CONSULT NOTE - FOLLOW UP  Pharmacy Consult for vancomycin  Indication: MRSA bacteremia  No Known Allergies  Patient Measurements: Height: 6\' 3"  (190.5 cm) Weight: 212 lb 1.3 oz (96.2 kg) IBW/kg (Calculated) : 84.5  Vital Signs: Temp: 98.8 F (37.1 C) (05/14 0710) Temp src: Oral (05/14 0710) BP: 153/78 mmHg (05/14 0710) Pulse Rate: 88 (05/14 0710) Intake/Output from previous day: 05/13 0701 - 05/14 0700 In: 1920 [P.O.:1320; IV Piggyback:600] Out: 3025 [Urine:3025] Intake/Output from this shift: Total I/O In: -  Out: 400 [Urine:400]  Labs:  Recent Labs  05/15/13 0500 05/16/13 0535 05/17/13 0520 05/17/13 0800  WBC 15.5* 11.2* 11.4* 10.2  HGB 7.1* 7.3* 6.3* 6.5*  PLT 385 348 389 347  CREATININE 0.98 0.87 0.93  --    Estimated Creatinine Clearance: 109.8 ml/min (by C-G formula based on Cr of 0.93).  Recent Labs  05/15/13 1820 05/17/13 0800  VANCOTROUGH 12.9 25.0*     Microbiology: Recent Results (from the past 720 hour(s))  URINE CULTURE     Status: None   Collection Time    05/10/13  7:16 PM      Result Value Ref Range Status   Specimen Description URINE, RANDOM   Final   Special Requests NONE   Final   Culture  Setup Time     Final   Value: 05/11/2013 02:42     Performed at [facility]     Final   Value: NO GROWTH     Performed at [facility]   Culture     Final   Value: NO GROWTH     Performed at [facility]   Report Status 05/11/2013 FINAL   Final  CULTURE, BLOOD (ROUTINE X 2)     Status: None   Collection Time    05/10/13 11:24 PM      Result Value Ref Range Status   Specimen Description BLOOD RIGHT FOREARM   Final   Special Requests BOTTLES DRAWN AEROBIC AND ANAEROBIC 10ML   Final   Culture  Setup Time     Final   Value: 05/11/2013 03:52     Performed at [facility]   Culture     Final   Value: PROTEUS MIRABILIS     ENTEROCOCCUS SPECIES     Note: SUSCEPTIBILITIES PERFORMED ON  PREVIOUS CULTURE WITHIN THE LAST 5 DAYS.     STAPHYLOCOCCUS AUREUS     Note: Susceptibilities performed on previous culture within the last 5 days.   Report Status 05/15/2013 FINAL   Final   Organism ID, Bacteria PROTEUS MIRABILIS   Final  CULTURE, BLOOD (ROUTINE X 2)     Status: None   Collection Time    05/10/13 11:27 PM      Result Value Ref Range Status   Specimen Description BLOOD RIGHT FOREARM   Final   Special Requests BOTTLES DRAWN AEROBIC AND ANAEROBIC 10ML   Final   Culture  Setup Time     Final   Value: 05/11/2013 03:51     Performed at [facility]   Culture     Final   Value: PROTEUS MIRABILIS     Note: SUSCEPTIBILITIES PERFORMED ON PREVIOUS CULTURE WITHIN THE LAST 5 DAYS.     ENTEROCOCCUS SPECIES     METHICILLIN RESISTANT STAPHYLOCOCCUS AUREUS     Note: RIFAMPIN AND GENTAMICIN SHOULD NOT BE USED AS SINGLE DRUGS FOR TREATMENT OF STAPH INFECTIONS. CRITICAL RESULT CALLED TO, READ BACK  BY AND VERIFIED WITH: KATHA H@2 :37PM ON 05/14/13 BY DANTS   Report Status 05/15/2013 FINAL   Final   Organism ID, Bacteria ENTEROCOCCUS SPECIES   Final   Organism ID, Bacteria METHICILLIN RESISTANT STAPHYLOCOCCUS AUREUS   Final  MRSA PCR SCREENING     Status: None   Collection Time    05/11/13  1:45 AM      Result Value Ref Range Status   MRSA by PCR NEGATIVE  NEGATIVE Final   Comment:            The GeneXpert MRSA Assay (FDA     approved for NASAL specimens     only), is one component of a     comprehensive MRSA colonization     surveillance program. It is not     intended to diagnose MRSA     infection nor to guide or     monitor treatment for     MRSA infections.  CULTURE, BLOOD (ROUTINE X 2)     Status: None   Collection Time    05/12/13  1:15 PM      Result Value Ref Range Status   Specimen Description BLOOD LEFT HAND   Final   Special Requests BOTTLES DRAWN AEROBIC AND ANAEROBIC 10CC   Final   Culture  Setup Time     Final   Value: 05/12/2013 17:38     Performed at  [facility]   Culture     Final   Value:        BLOOD CULTURE RECEIVED NO GROWTH TO DATE CULTURE WILL BE HELD FOR 5 DAYS BEFORE ISSUING A FINAL NEGATIVE REPORT     Performed at [facility]   Report Status PENDING   Incomplete    Anti-infectives   Start     Dose/Rate Route Frequency Ordered Stop   05/16/13 0300  vancomycin (VANCOCIN) IVPB 1000 mg/200 mL premix     1,000 mg 200 mL/hr over 60 Minutes Intravenous Every 8 hours 05/15/13 1927     05/15/13 1200  Ampicillin-Sulbactam (UNASYN) 3 g in sodium chloride 0.9 % 100 mL IVPB     3 g 100 mL/hr over 60 Minutes Intravenous Every 6 hours 05/15/13 1056     05/14/13 1600  ceFAZolin (ANCEF) IVPB 2 g/50 mL premix  Status:  Discontinued     2 g 100 mL/hr over 30 Minutes Intravenous Every 8 hours 05/14/13 1315 05/15/13 1036   05/13/13 0600  vancomycin (VANCOCIN) 1,500 mg in sodium chloride 0.9 % 500 mL IVPB  Status:  Discontinued     1,500 mg 250 mL/hr over 120 Minutes Intravenous Every 12 hours 05/12/13 2301 05/15/13 1927   05/12/13 1600  ceFAZolin (ANCEF) IVPB 1 g/50 mL premix  Status:  Discontinued     1 g 100 mL/hr over 30 Minutes Intravenous Every 8 hours 05/12/13 1508 05/14/13 1315   05/11/13 0600  piperacillin-tazobactam (ZOSYN) IVPB 3.375 g  Status:  Discontinued     3.375 g 12.5 mL/hr over 240 Minutes Intravenous Every 8 hours 05/10/13 2313 05/12/13 1456   05/10/13 2315  vancomycin (VANCOCIN) IVPB 1000 mg/200 mL premix  Status:  Discontinued     1,000 mg 200 mL/hr over 60 Minutes Intravenous Every 12 hours 05/10/13 2313 05/12/13 2300   05/10/13 2315  piperacillin-tazobactam (ZOSYN) IVPB 3.375 g     3.375 g 100 mL/hr over 30 Minutes Intravenous  Once 05/10/13 2313 05/11/13 0036      Assessment: 53 y/o male on  day 7 vancomycin MRSA bacteremia and day 3 Unasyn for Proteus mirabilis and Enterococcus bacteremia - these are appropriate for sensitivities, ID also following, plan is for 21 days. Renal function is stable  and UOP is good. WBC are trending down and he is afebrile. Repeat blood culture is negative thus far.  Vancomycin trough was 25 this morning however it was drawn 2.5 hours early despite being ordered as a timed lab. Likely the true trough is closer to ~20 but will decrease dose empirically.  Vanc 5/8>> 5/9 VT 11.2 on 1g q12, adj for calc'd VT 18> 1.5g q12h 5/12 VT 12.9 on 1.5g q12, chg 1g q8  Zosyn 5/8>5/9 Cefazolin 5/9>>5/12 Unasyn 5/12>>  5/7 UCx> ng 5/7 Bcx x2> Proteus mirabilis (pan sens), MRSA, Enterococcus (sens amp) 5/9 BCx>> ngtd   Goal of Therapy:  Vancomycin trough level 15-20 mcg/ml Eradication of infection  Plan:  - Decrease vancomycin to 750 mg IV q8h - Continue Unasyn 3 g IV q6h - Check vancomycin trough at steady state - Monitor renal function, clinical course, and culture data  [facility], [place].D., BCPS Clinical Pharmacist Pager: [phone] 05/17/2013 12:36 PM

## 2013-05-17 NOTE — Progress Notes (Signed)
Patient's left metatarsal amputation draining with purulent drainage.Patient denies any pain.Dr. Heber [place] notified,order received to get wound culture.Covered with dry gauze. Toniette Devera Lurline Hare, RN

## 2013-05-17 NOTE — Progress Notes (Signed)
Pt ordered 1 unit PRBC's.  After discussing consent and risks.  Pt declined to have blood transfusion.  Pt stated he did not feel bad and didn't feel as he needed it at this time and was concerned about possible risks.  Paged Dr. Casimer Lanius first contact.

## 2013-05-17 NOTE — Progress Notes (Signed)
Subjective:  Pt seen and examined in AM.  No acute events overnight. Denies fever or chills. Reports his pain is controlled and able to ambulate with PT.  Hg this AM 6.3, later 6.5, denies lightheadedness, dyspnea, or tachycardia.    Objective: Vital signs in last 24 hours: Filed Vitals:   05/17/13 0527 05/17/13 0709 05/17/13 0710 05/17/13 1300  BP: 151/78  153/78 125/80  Pulse: 91  88 88  Temp: 98.3 F (36.8 C)  98.8 F (37.1 C) 98.6 F (37 C)  TempSrc: Oral  Oral Oral  Resp: 16  16 18   Height:      Weight:      SpO2: 88% 93% 95% 96%   Weight change: 5.9 kg (13 lb 0.1 oz)  Intake/Output Summary (Last 24 hours) at 05/17/13 1636 Last data filed at 05/17/13 1300  Gross per 24 hour  Intake   1260 ml  Output   2325 ml  Net  -1065 ml   Physical Exam:  General: NAD Heart: Regular rate and rhythm Lungs: Clear to ausculation b/l. 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 Neuro: A &O x 3  Lab Results: Basic Metabolic Panel:  Recent Labs Lab 05/16/13 0535 05/17/13 0520  NA 136* 138  K 4.0 4.1  CL 98 99  CO2 28 29  GLUCOSE 176* 188*  BUN 10 11  CREATININE 0.87 0.93  CALCIUM 8.3* 8.5   Liver Function Tests:  Recent Labs Lab 05/10/13 1750  AST 44*  ALT 46  ALKPHOS 199*  BILITOT 1.1  PROT 7.3  ALBUMIN 2.3*   CBC:  Recent Labs Lab 05/14/13 0500  05/16/13 0535 05/17/13 0520 05/17/13 0800  WBC 10.7*  < > 11.2* 11.4* 10.2  NEUTROABS 7.6  --  8.8*  --   --   HGB 7.1*  < > 7.3* 6.3* 6.5*  HCT 22.2*  < > 22.8* 20.5* 20.4*  MCV 85.7  < > 85.7 87.6 87.6  PLT 341  < > 348 389 347  < > = values in this interval not displayed. BNP:  Recent Labs Lab 05/10/13 2324  PROBNP 2639.0*   CBG:  Recent Labs Lab 05/16/13 0836 05/16/13 1208 05/16/13 1525 05/16/13 2044 05/17/13 0814 05/17/13 1203  GLUCAP 165*  134* 123* 190* 186* 170*   Thyroid Function Tests:  Recent Labs Lab 05/11/13 0349  TSH 0.869    Urinalysis:  Recent Labs Lab 05/10/13 1916  COLORURINE YELLOW  LABSPEC 1.019  PHURINE 5.5  GLUCOSEU >1000*  HGBUR LARGE*  BILIRUBINUR NEGATIVE  KETONESUR 15*  PROTEINUR >300*  UROBILINOGEN 0.2  NITRITE NEGATIVE  LEUKOCYTESUR NEGATIVE     Micro Results: Recent Results (from the past 240 hour(s))  URINE CULTURE     Status: None   Collection Time    05/10/13  7:16 PM      Result Value Ref Range Status   Specimen Description URINE, RANDOM   Final   Special Requests NONE   Final   Culture  Setup Time     Final   Value: 05/11/2013 02:42     Performed at [place]     Final   Value: NO GROWTH     Performed at [facility]   Culture     Final   Value: NO GROWTH     Performed  at [facility]   Report Status 05/11/2013 FINAL   Final  CULTURE, BLOOD (ROUTINE X 2)     Status: None   Collection Time    05/10/13 11:24 PM      Result Value Ref Range Status   Specimen Description BLOOD RIGHT FOREARM   Final   Special Requests BOTTLES DRAWN AEROBIC AND ANAEROBIC 10ML   Final   Culture  Setup Time     Final   Value: 05/11/2013 03:52     Performed at [facility]   Culture     Final   Value: PROTEUS MIRABILIS     ENTEROCOCCUS SPECIES     Note: SUSCEPTIBILITIES PERFORMED ON PREVIOUS CULTURE WITHIN THE LAST 5 DAYS.     STAPHYLOCOCCUS AUREUS     Note: Susceptibilities performed on previous culture within the last 5 days.   Report Status 05/15/2013 FINAL   Final   Organism ID, Bacteria PROTEUS MIRABILIS   Final  CULTURE, BLOOD (ROUTINE X 2)     Status: None   Collection Time    05/10/13 11:27 PM      Result Value Ref Range Status   Specimen Description BLOOD RIGHT FOREARM   Final   Special Requests BOTTLES DRAWN AEROBIC AND ANAEROBIC 10ML   Final   Culture  Setup Time     Final   Value: 05/11/2013 03:51     Performed at  [facility]   Culture     Final   Value: PROTEUS MIRABILIS     Note: SUSCEPTIBILITIES PERFORMED ON PREVIOUS CULTURE WITHIN THE LAST 5 DAYS.     ENTEROCOCCUS SPECIES     METHICILLIN RESISTANT STAPHYLOCOCCUS AUREUS     Note: RIFAMPIN AND GENTAMICIN SHOULD NOT BE USED AS SINGLE DRUGS FOR TREATMENT OF STAPH INFECTIONS. CRITICAL RESULT CALLED TO, READ BACK BY AND VERIFIED WITH: KATHA H@2 :37PM ON 05/14/13 BY DANTS   Report Status 05/15/2013 FINAL   Final   Organism ID, Bacteria ENTEROCOCCUS SPECIES   Final   Organism ID, Bacteria METHICILLIN RESISTANT STAPHYLOCOCCUS AUREUS   Final  MRSA PCR SCREENING     Status: None   Collection Time    05/11/13  1:45 AM      Result Value Ref Range Status   MRSA by PCR NEGATIVE  NEGATIVE Final   Comment:            The GeneXpert MRSA Assay (FDA     approved for NASAL specimens     only), is one component of a     comprehensive MRSA colonization     surveillance program. It is not     intended to diagnose MRSA     infection nor to guide or     monitor treatment for     MRSA infections.  CULTURE, BLOOD (ROUTINE X 2)     Status: None   Collection Time    05/12/13  1:15 PM      Result Value Ref Range Status   Specimen Description BLOOD LEFT HAND   Final   Special Requests BOTTLES DRAWN AEROBIC AND ANAEROBIC 10CC   Final   Culture  Setup Time     Final   Value: 05/12/2013 17:38     Performed at [facility]   Culture     Final   Value:        BLOOD CULTURE RECEIVED NO GROWTH TO DATE CULTURE WILL BE HELD FOR 5 DAYS BEFORE ISSUING A FINAL NEGATIVE REPORT  Performed at [facility]   Report Status PENDING   Incomplete   Studies/Results: No results found. Medications: I have reviewed the patient's current medications. Scheduled Meds: . amLODipine  5 mg Oral Daily  . ampicillin-sulbactam (UNASYN) IV  3 g Intravenous Q6H  . aspirin EC  325 mg Oral Daily  . atorvastatin  10 mg Oral q1800  . carvedilol  3.125 mg Oral BID WC  .  dextromethorphan-guaiFENesin  1 tablet Oral BID  . furosemide  20 mg Oral BID  . gabapentin  600 mg Oral TID  . insulin aspart  0-20 Units Subcutaneous TID WC  . insulin aspart  0-5 Units Subcutaneous QHS  . insulin aspart  5 Units Subcutaneous TID WC  . insulin glargine  70 Units Subcutaneous QHS  . sodium chloride  10-40 mL Intracatheter Q12H  . sodium chloride  3 mL Intravenous Q12H  . vancomycin  750 mg Intravenous Q8H   Continuous Infusions: . sodium chloride 20 mL/hr at 05/14/13 1705   PRN Meds:.acetaminophen, albuterol, benzonatate, magnesium hydroxide, ondansetron (ZOFRAN) IV, ondansetron, oxyCODONE-acetaminophen, sodium chloride Assessment/Plan:  Asymptomatic Acute on Chronic Normocytic Anemia - Hg 6.3 then 6.5 this AM below baseline 10 with no active bleeding or hemodynamic instability. Pt is s/p right toe amputation. 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 on 5/8 - no current sepsis criteria, per ortho mild maceration with no signs of infection or necrotic tissue -Appreciate orthopedic surgery following  -Continue IV vancomycin and unsayn (Day 7/21 total day therapy end date 5/28) -Monitor CBC and fever curve  -Percocet/Roxicet 1-2 tbs Q 4hr PRN -Gabapentin 600 mg TID  -PT/OT therapies --> home health PT  -Non-weight bearing to right LE -Aspirin 325 mg daily for DVT Ppx -Orthopedics outpatient follow-up  Proteus Mirabilis, MRSA, & Enterococcus Bacteremia - Pt with blood cultures (2/2) from 5/7 with pansensitive  Proteus Mirabilis, MRSA, and pansensitive Enterococcus. TEE on 5/11 with no vegetations.     -Appreciate ID consult  -F/U repeat blood cultures 5/9 --> NGTD    -Continue IV vancomycin and unsayn (Day 7/21 total day therapy end date 5/28) ) -[facility] -Outpatient ID follow-up  Insulin-dependent Type II DM - A1c 11.5 on 05/12/13. Pt at home on metformin 1000 BID,  Lantus 70 U, and Novolog 27 U TID.  -Appreciate diabetes coordinator recommendations -Continue home Lantus 70 U  -Resistant SSI with meals & bedtime -Monitor CBG with meals & bedtime   Hypertension - Currently normotensive.  -Continue home furosemide 20 mg BID -Continue home carvedilol 3.125 mg BID -Continue newly started amlodipine 5 mg daily  -ACEi/ARB with SE cough   Grade 1 Diastolic Chronic Heart Failure - Possibly mild exacerbation on admission. Last 2D-echo on 03/28/13 with EF 55-60% and grade 1 dysfunction. Pro-BNP 2639 on admission. -Monitor volume status -Daily weight and strict I & O's  Hyperlipidemia - Last lipid panel on 05/12/11 with cholesterol 206, TG 100, HDL 40, LDL 146. Pt at home on crestor 5 mg daily. -Atorvastatin 10 mg daily   Hyponatremia - Resolved.  Etiology likely due to hypovolemia and hyperglycemia.  -NS bolus as needed  -Monitor BMP -Encourage adequate hydration  Anion Gap Metabolic Acidosis - Resolved. AG 12. Most likely due to severe sepsis.  -Monitor BMP  AKI - Resolved. Cr near baseline of 1. Etiology most likely pre-renal azotemia with Fe Urea 51.7 %.  -Monitor BMP   Acute Respiratory Failure in setting of pulmonary edema - Resolved. Etiology possibly due to acute on chronic diastolic CHF. -Oxygen supplementation to keep SpO2 >92% -Albuterol nebulizer Q 6 hr PRN -Continue IV antibiotics to treat for possible HCAP/aspiration PNA -Incentive spirometry  Diet: Carb modified DVT Ppx: SQ Heparin TID, ASA 325 mg  Code: Full  Dispo: Disposition is deferred at this time, awaiting improvement of current medical problems.    The patient does have a current PCP (Neema Bobbie Stack, MD) and does need an [facility]  hospital follow-up appointment after discharge.  The patient does not have transportation limitations that hinder transportation to clinic appointments.  .Services Needed at time of discharge: Y = Yes, Blank = No PT:  Yes  OT:  No   RN:  Yes  Equipment:  3-in-1; tub bench, wheelchair cushion  Other:  IV antibiotics     LOS: 7 days   Juluis Mire, MD 05/17/2013, 4:36 PM

## 2013-05-18 DIAGNOSIS — L98499 Non-pressure chronic ulcer of skin of other sites with unspecified severity: Secondary | ICD-10-CM

## 2013-05-18 LAB — CBC
HCT: 20.2 % — ABNORMAL LOW (ref 39.0–52.0)
HCT: 20.4 % — ABNORMAL LOW (ref 39.0–52.0)
Hemoglobin: 6.4 g/dL — CL (ref 13.0–17.0)
Hemoglobin: 6.4 g/dL — CL (ref 13.0–17.0)
MCH: 27.6 pg (ref 26.0–34.0)
MCH: 27.1 pg (ref 26.0–34.0)
MCHC: 31.7 g/dL (ref 30.0–36.0)
MCHC: 31.4 g/dL (ref 30.0–36.0)
MCV: 87.1 fL (ref 78.0–100.0)
MCV: 86.4 fL (ref 78.0–100.0)
PLATELETS: 364 10*3/uL (ref 150–400)
Platelets: 380 10*3/uL (ref 150–400)
RBC: 2.32 MIL/uL — AB (ref 4.22–5.81)
RBC: 2.36 MIL/uL — AB (ref 4.22–5.81)
RDW: 15.2 % (ref 11.5–15.5)
RDW: 15.2 % (ref 11.5–15.5)
WBC: 10.3 10*3/uL (ref 4.0–10.5)
WBC: 8.7 10*3/uL (ref 4.0–10.5)

## 2013-05-18 LAB — BASIC METABOLIC PANEL
BUN: 12 mg/dL (ref 6–23)
CO2: 28 meq/L (ref 19–32)
CREATININE: 0.99 mg/dL (ref 0.50–1.35)
Calcium: 8.5 mg/dL (ref 8.4–10.5)
Chloride: 94 mEq/L — ABNORMAL LOW (ref 96–112)
GFR calc Af Amer: >90 mL/min (ref >90)
GFR calc non Af Amer: >90 mL/min (ref >90)
Glucose, Bld: 270 mg/dL — ABNORMAL HIGH (ref 70–99)
Potassium: 4.4 mEq/L (ref 3.7–5.3)
Sodium: 131 mEq/L — ABNORMAL LOW (ref 137–147)

## 2013-05-18 LAB — CULTURE, BLOOD (ROUTINE X 2): Culture: NO GROWTH

## 2013-05-18 LAB — OCCULT BLOOD X 1 CARD TO LAB, STOOL: Fecal Occult Bld: NEGATIVE

## 2013-05-18 LAB — HEPATIC FUNCTION PANEL
ALK PHOS: 214 U/L — AB (ref 39–117)
ALT: 24 U/L (ref 0–53)
AST: 17 U/L (ref 0–37)
Albumin: 2.1 g/dL — ABNORMAL LOW (ref 3.5–5.2)
Bilirubin, Direct: <0.2 mg/dL (ref 0.0–0.3)
Total Bilirubin: 0.2 mg/dL — ABNORMAL LOW (ref 0.3–1.2)
Total Protein: 6.9 g/dL (ref 6.0–8.3)

## 2013-05-18 LAB — RETICULOCYTES
RBC.: 2.46 MIL/uL — AB (ref 4.22–5.81)
RETIC CT PCT: 3.1 % (ref 0.4–3.1)
Retic Count, Absolute: 76.3 10*3/uL (ref 19.0–186.0)

## 2013-05-18 LAB — DIRECT ANTIGLOBULIN TEST (NOT AT ARMC)
DAT, IGG: NEGATIVE
DAT, complement: NEGATIVE

## 2013-05-18 LAB — GLUCOSE, CAPILLARY
GLUCOSE-CAPILLARY: 294 mg/dL — AB (ref 70–99)
Glucose-Capillary: 274 mg/dL — ABNORMAL HIGH (ref 70–99)
Glucose-Capillary: 217 mg/dL — ABNORMAL HIGH (ref 70–99)
Glucose-Capillary: 140 mg/dL — ABNORMAL HIGH (ref 70–99)

## 2013-05-18 LAB — LACTATE DEHYDROGENASE: LDH: 209 U/L (ref 94–250)

## 2013-05-18 MED ORDER — INSULIN ASPART 100 UNIT/ML ~~LOC~~ SOLN
10.0000 [IU] | Freq: Three times a day (TID) | SUBCUTANEOUS | Status: DC
  Administered 2013-05-19 – 2013-05-21 (×6): 10 [IU] via SUBCUTANEOUS

## 2013-05-18 NOTE — Progress Notes (Signed)
[facility] to provide home IV antibiotics, awaiting discharge date.

## 2013-05-18 NOTE — Progress Notes (Signed)
Lab called with a critical of Hbg 6.4

## 2013-05-18 NOTE — Progress Notes (Signed)
Pt working with pt, up to chair.

## 2013-05-18 NOTE — Progress Notes (Signed)
Subjective:  Pt seen and examined in AM. Overnight with purulent drainage from left foot that soaked his sock and wound cultures were sent.  Denies fever or chills. Reports his pain is controlled and able to ambulate with PT. Hg this AM 6.4  however reports he will have blood transfusion if required tomm in morning if Hg still low. Denies lightheadedness, dyspnea, or palpitations. Reports no bleeding.   Objective: Vital signs in last 24 hours: Filed Vitals:   05/17/13 2130 05/18/13 0422 05/18/13 0825 05/18/13 1130  BP: 162/80 142/79 123/67 133/75  Pulse: 90 93 87 90  Temp: 97.4 F (36.3 C) 98.6 F (37 C) 98 F (36.7 C) 98.3 F (36.8 C)  TempSrc: Oral Oral Oral Oral  Resp: 18 18 18 18   Height:      Weight: 98.2 kg (216 lb 7.9 oz)     SpO2: 93% 92% 94% 95%   Weight change: 2 kg (4 lb 6.6 oz)  Intake/Output Summary (Last 24 hours) at 05/18/13 1207 Last data filed at 05/18/13 1129  Gross per 24 hour  Intake   1320 ml  Output   2191 ml  Net   -871 ml   Physical Exam:  General: NAD Heart: Regular rate and rhythm Lungs: Clear to ausculation b/l. 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 CBG:  Recent Labs Lab 05/16/13 2044 05/17/13 0814 05/17/13 1203  05/17/13 1644 05/17/13 2127 05/18/13 0823  GLUCAP 190* 186* 170* 141* 191* 274*    Micro Results: Recent Results (from the past 240 hour(s))  URINE CULTURE     Status: None   Collection Time    05/10/13  7:16 PM      Result Value Ref Range Status   Specimen Description URINE, RANDOM   Final   Special Requests NONE   Final   Culture  Setup Time     Final   Value: 05/11/2013 02:42     Performed at [place]     Final   Value: NO GROWTH     Performed at [facility]   Culture     Final   Value: NO GROWTH     Performed at [facility]   Report Status 05/11/2013 FINAL   Final  CULTURE, BLOOD (ROUTINE X 2)     Status: None   Collection Time    05/10/13 11:24 PM      Result Value  Ref Range Status   Specimen Description BLOOD RIGHT FOREARM   Final   Special Requests BOTTLES DRAWN AEROBIC AND ANAEROBIC 10ML   Final   Culture  Setup Time     Final   Value: 05/11/2013 03:52     Performed at [facility]   Culture     Final   Value: PROTEUS MIRABILIS     ENTEROCOCCUS SPECIES     Note: SUSCEPTIBILITIES PERFORMED ON PREVIOUS CULTURE WITHIN THE LAST 5 DAYS.     STAPHYLOCOCCUS AUREUS     Note: Susceptibilities performed on previous culture within the last 5 days.   Report Status 05/15/2013 FINAL   Final   Organism ID, Bacteria PROTEUS MIRABILIS   Final  CULTURE, BLOOD (ROUTINE X 2)     Status: None   Collection Time    05/10/13 11:27 PM      Result Value Ref Range Status   Specimen Description BLOOD RIGHT FOREARM   Final   Special Requests BOTTLES DRAWN AEROBIC AND ANAEROBIC 10ML   Final   Culture  Setup Time     Final   Value: 05/11/2013 03:51     Performed at [facility]   Culture     Final   Value: PROTEUS MIRABILIS     Note: SUSCEPTIBILITIES PERFORMED ON PREVIOUS CULTURE WITHIN THE LAST 5 DAYS.     ENTEROCOCCUS SPECIES     METHICILLIN RESISTANT STAPHYLOCOCCUS AUREUS     Note: RIFAMPIN AND GENTAMICIN SHOULD NOT BE  USED AS SINGLE DRUGS FOR TREATMENT OF STAPH INFECTIONS. CRITICAL RESULT CALLED TO, READ BACK BY AND VERIFIED WITH: KATHA H@2 :37PM ON 05/14/13 BY DANTS   Report Status 05/15/2013 FINAL   Final   Organism ID, Bacteria ENTEROCOCCUS SPECIES   Final   Organism ID, Bacteria METHICILLIN RESISTANT STAPHYLOCOCCUS AUREUS   Final  MRSA PCR SCREENING     Status: None   Collection Time    05/11/13  1:45 AM      Result Value Ref Range Status   MRSA by PCR NEGATIVE  NEGATIVE Final   Comment:            The GeneXpert MRSA Assay (FDA     approved for NASAL specimens     only), is one component of a     comprehensive MRSA colonization     surveillance program. It is not     intended to diagnose MRSA     infection nor to guide or     monitor treatment for     MRSA infections.  CULTURE, BLOOD (ROUTINE X 2)     Status: None   Collection Time    05/12/13  1:15 PM      Result Value Ref Range Status   Specimen Description BLOOD LEFT HAND   Final   Special Requests BOTTLES DRAWN AEROBIC AND ANAEROBIC 10CC   Final   Culture  Setup Time     Final   Value: 05/12/2013 17:38     Performed at [facility]   Culture     Final   Value: NO GROWTH 5 DAYS     Performed at [facility]   Report Status 05/18/2013 FINAL   Final  WOUND CULTURE     Status: None   Collection Time    05/17/13 10:56 PM      Result Value Ref Range Status   Specimen Description WOUND LEFT FOOT   Final   Special Requests NONE   Final  Gram Stain     Final   Value: FEW WBC PRESENT,BOTH PMN AND MONONUCLEAR     FEW SQUAMOUS EPITHELIAL CELLS PRESENT     RARE GRAM POSITIVE COCCI     IN PAIRS     Performed at [facility]   Culture     Final   Value: NO GROWTH     Performed at [facility]   Report Status PENDING   Incomplete   Studies/Results: No results found. Medications: I have reviewed the patient's current medications. Scheduled Meds: . amLODipine  5 mg Oral Daily  . ampicillin-sulbactam  (UNASYN) IV  3 g Intravenous Q6H  . aspirin EC  325 mg Oral Daily  . atorvastatin  10 mg Oral q1800  . carvedilol  3.125 mg Oral BID WC  . dextromethorphan-guaiFENesin  1 tablet Oral BID  . furosemide  20 mg Oral BID  . gabapentin  600 mg Oral TID  . insulin aspart  0-20 Units Subcutaneous TID WC  . insulin aspart  0-5 Units Subcutaneous QHS  . insulin aspart  5 Units Subcutaneous TID WC  . insulin glargine  70 Units Subcutaneous QHS  . sodium chloride  10-40 mL Intracatheter Q12H  . sodium chloride  3 mL Intravenous Q12H  . vancomycin  750 mg Intravenous Q8H   Continuous Infusions: . sodium chloride 20 mL/hr at 05/14/13 1705   PRN Meds:.acetaminophen, albuterol, benzonatate, magnesium hydroxide, ondansetron (ZOFRAN) IV, ondansetron, oxyCODONE-acetaminophen, sodium chloride Assessment/Plan:  Asymptomatic Acute on Chronic Normocytic Anemia - Hg 6.4 this AM below baseline 10 with no active bleeding or hemodynamic instability. Pt is s/p right toe amputation. Anemia panel on 5/13 with reticulocytes (wnl), ferritin(394), iron (15 L), TIBC (159 L), Sat% (9L), folate (9), B12 (430) -Transfuse if Hg<7 --> pt still refusing 1 pRBC, but agreeable tomm in AM if Hg still <7  -Monitor CBC -Obtain hemolytic anemia work-up -Obtain FOBT  -Outpatient screening colonoscopy   Left Plantar ulcer - Reported purulent drainage from plantar surface on 5/14.  -Follow-up wound culture from 5/14 for left foot -Consult Dr. 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 & 2nd Ray amputation on 5/8 - no current sepsis criteria, per ortho mild maceration with no signs of infection or necrotic tissue -Appreciate orthopedic surgery following  -Continue IV vancomycin and unsayn (Day 8/21 total day therapy end date 5/28) -Monitor CBC and fever curve  -Percocet/Roxicet 1-2 tbs Q 4hr PRN -Gabapentin 600 mg TID  -PT/OT therapies --> home health PT    -Non-weight bearing to right LE -Aspirin 325 mg daily and SCD's for DVT Ppx -Outpatient orthopedic follow-up scheduled  Proteus Mirabilis, MRSA, & Enterococcus Bacteremia - Pt with blood cultures (2/2) from 5/7 with pansensitive  Proteus Mirabilis, MRSA, and pansensitive Enterococcus. TEE on 5/11 with no vegetations.     -Appreciate ID consult  -F/U repeat blood cultures 5/9 --> NG (final report)  -Continue IV vancomycin and unsayn (Day 8/21 total day therapy end date 5/28) ) -[facility] -Outpatient ID follow-up scheduled   Hyponatremia - Na 131 this AM. Etiology likely due to hypovolemia and hyperglycemia.  -NS bolus as needed  -Monitor BMP -Encourage adequate hydration   Insulin-dependent Type II DM - A1c 11.5 on 05/12/13. Pt at home on metformin 1000 BID,  Lantus 70 U, and Novolog 27 U TID.  -Appreciate diabetes coordinator recommendations -Continue home Lantus 70 U  -  Resistant SSI with meals & bedtime -Monitor CBG with meals & bedtime   Hypertension - Currently normotensive.  -Continue home furosemide 20 mg BID -Continue home carvedilol 3.125 mg BID -Continue newly started amlodipine 5 mg daily  -ACEi/ARB with SE cough   Grade 1 Diastolic Chronic Heart Failure - Possibly mild exacerbation on admission. Last 2D-echo on 03/28/13 with EF 55-60% and grade 1 dysfunction. Pro-BNP 2639 on admission. -Monitor volume status -Daily weight and strict I & O's  Hyperlipidemia - Last lipid panel on 05/12/11 with cholesterol 206, TG 100, HDL 40, LDL 146. Pt at home on crestor 5 mg daily. -Atorvastatin 10 mg daily   Anion Gap Metabolic Acidosis - Resolved.  Most likely due to severe sepsis.  -Monitor BMP  AKI - Resolved. Cr near baseline of 1. Etiology most likely pre-renal azotemia with Fe Urea 51.7 %.  -Monitor BMP   Acute Respiratory Failure in setting of pulmonary edema - Resolved. Etiology possibly due to acute on chronic diastolic CHF. -Oxygen  supplementation to keep SpO2 >92% -Albuterol nebulizer Q 6 hr PRN -Continue IV antibiotics to treat for possible HCAP/aspiration PNA -Incentive spirometry  Diet: Carb modified DVT Ppx:  ASA 325 mg, SCD's  Code: Full  Dispo: Disposition is deferred at this time, awaiting improvement of current medical problems.    The patient does have a current PCP (Neema Bobbie Stack, MD) and does need an [facility] hospital follow-up appointment after discharge.  The patient does not have transportation limitations that hinder transportation to clinic appointments.  .Services Needed at time of discharge: Y = Yes, Blank = No PT:  Yes  OT:  No   RN:  Yes  Equipment:  3-in-1; tub bench, wheelchair cushion  Other:  IV antibiotics     LOS: 8 days   Juluis Mire, MD 05/18/2013, 12:07 PM

## 2013-05-18 NOTE — Consult Note (Signed)
WOC wound consult note Reason for Consult: Consult requested for left foot wound.  Pt is post-op amputation of toe and is followed by Dr Sharol Given for assessment and plan of care; refer to progress note on 5/13. Please call Dr Sharol Given for any questions or topical treatment orders and please re-consult if further assistance is needed.  Thank-you,  Julien Girt MSN, [place], [place], [place], [place]

## 2013-05-18 NOTE — Progress Notes (Signed)
OT Cancellation Note  Patient Details Name: [name] MRN: [id number] DOB: Aug 28, 1960   Cancelled Treatment:     Reason pt not seen for OT: Cx occupational therapy treatment secondary to medical issues, per PT, plantar surface of L foot has broken open/draining from walking on it & deferred gait Fri. Pt also with noted critical value Hgb of 6.4 today. Will re-attempt as able.   Amy B Barnhill 05/18/2013, 9:50 AM

## 2013-05-18 NOTE — Progress Notes (Signed)
Inpatient Diabetes Program Recommendations  AACE/ADA: New Consensus Statement on Inpatient Glycemic Control (2013)  Target Ranges:  Prepandial:   less than 140 mg/dL      Peak postprandial:   less than 180 mg/dL (1-2 hours)      Critically ill patients:  140 - 180 mg/dL   Results for [name], [name] (MRN [id number]) as of 05/18/2013 13:07  Ref. Range 05/17/2013 08:14 05/17/2013 12:03 05/17/2013 16:44 05/17/2013 21:27 05/18/2013 08:23 05/18/2013 11:27  Glucose-Capillary Latest Range: 70-99 mg/dL 186 (H) 170 (H) 141 (H) 191 (H) 274 (H) 294 (H)   Diabetes history: DM2  Outpatient Diabetes medications: Lantus 70 units QHS, Novolog 27 units TID with meals, and Metformin 1000 mg BID  Current orders for Inpatient glycemic control: Lantus 70 units QHS, Novolog 0-20 units AC, Novolog 0-5 units HS, Novolog 5 units TID with meals  Inpatient Diabetes Program Recommendations Insulin - Basal: Please consider increasing Lantus to 72 units QHS. Insulin - Meal Coverage: Please consider increasing Novolog meal coverage to 10 units TID with meals.  Thanks, Barnie Alderman, RN, MSN, CCRN Diabetes Coordinator Inpatient Diabetes Program [phone] (Team Pager) [phone] ([facility] office) [phone] [facility] office)

## 2013-05-18 NOTE — Progress Notes (Signed)
Physical Therapy Treatment Patient Details Name: [name] MRN: [id number] DOB: 1960/09/25 Today's Date: 05/18/2013    History of Present Illness Pt is a 53 y/o male admitted s/p amputation of right first ray and amputation of second digit on right. 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 reports his left foot began draining last night after a walk to the bathroom with nursing staff.  Dressing iis intact with mild drainage noted through bandaging.    PT Comments    Pt. Reports that following his walk to the bathroom last night with nursing staff, he began to have drainage from bottom of L foot.  I advised him to limit himself to transfers to determine course of L foot drainage.  Would request MD check L foot wound.  Pt. Is educated on LE exs and chair push ups to complete in the interim until we can work on walking again  Follow Up Recommendations  Home health PT     Equipment Recommendations  3in1 (PT);Rolling walker with 5" wheels    Recommendations for Other Services OT consult     Precautions / Restrictions Precautions Precautions: Fall Precaution Comments: L midfoot amputation previously, now draining and bandage in place Restrictions Weight Bearing Restrictions: Yes RLE Weight Bearing: Non weight bearing Other Position/Activity Restrictions: I discussed at length with pt. that it is advisable to currently limit his walking due to new drainage coming from L foot.  I advised/educated him to limit to bed to chair/BSC transfers for protection of L foot.  He says he wiill be embarrassed to use [facility] but is agreeable to do so.    Mobility  Bed Mobility Overal bed mobility: Modified Independent Bed Mobility: Supine to Sit     Supine to sit: Modified independent (Device/Increase time)     General bed mobility comments: Use of bedrails  Transfers Overall transfer level: Needs assistance   Transfers: Sit  to/from Stand;Stand Pivot Transfers Sit to Stand: Supervision Stand pivot transfers: Supervision       General transfer comment: supervision for safety; pt. transferred to bedside chair and positioned both LEs in elevated position on the bed.  He says he prefers to sit in this bedside chair as opposed to recliner.    Ambulation/Gait Ambulation/Gait assistance:  (not attempted)               Financial trader Rankin (Stroke Patients Only)       Balance             Standing balance-Leahy Scale: Poor Standing balance comment: pt. with good balance standing at edge of bed but rates as "poor" due to need to hold onto bedrail with one hand and NWB R LE                    Cognition Arousal/Alertness: Awake/alert Behavior During Therapy: WFL for tasks assessed/performed Overall Cognitive Status: Within Functional Limits for tasks assessed                      Exercises General Exercises - Lower Extremity Long Arc Quad: AROM;Both;10 reps Heel Slides: AROM;Both;10 reps Straight Leg Raises: AROM;Both;5 reps Amputee Exercises Chair Push Up: AROM;Both;5 reps;Seated    General Comments        Pertinent Vitals/Pain See vitals tab     Home Living  Prior Function            PT Goals (current goals can now be found in the care plan section) Progress towards PT goals: Progressing toward goals    Frequency  Min 3X/week    PT Plan Current plan remains appropriate    Co-evaluation             End of Session   Activity Tolerance: Patient tolerated treatment well Patient left: in chair;with call bell/phone within reach     Time: 0840-0903 PT Time Calculation (min): 23 min  Charges:  $Therapeutic Exercise: 8-22 mins $Self Care/Home Management: 8-22                    G Codes:      Ladona Ridgel 05/18/2013, 9:19 AM Gerlean Ren PT Acute Rehab Services  [phone] Bee[place] [phone]

## 2013-05-19 LAB — CBC WITH DIFFERENTIAL/PLATELET
BASOS ABS: 0 10*3/uL (ref 0.0–0.1)
Basophils Absolute: 0 10*3/uL (ref 0.0–0.1)
Basophils Relative: 0 % (ref 0–1)
Basophils Relative: 0 % (ref 0–1)
EOS ABS: 0.2 10*3/uL (ref 0.0–0.7)
EOS PCT: 2 % (ref 0–5)
Eosinophils Absolute: 0.2 10*3/uL (ref 0.0–0.7)
Eosinophils Relative: 2 % (ref 0–5)
HCT: 19.9 % — ABNORMAL LOW (ref 39.0–52.0)
HCT: 26.1 % — ABNORMAL LOW (ref 39.0–52.0)
Hemoglobin: 6 g/dL — CL (ref 13.0–17.0)
Hemoglobin: 8.5 g/dL — ABNORMAL LOW (ref 13.0–17.0)
LYMPHS PCT: 21 % (ref 12–46)
Lymphocytes Relative: 22 % (ref 12–46)
Lymphs Abs: 1.7 10*3/uL (ref 0.7–4.0)
Lymphs Abs: 2 10*3/uL (ref 0.7–4.0)
MCH: 26.5 pg (ref 26.0–34.0)
MCH: 28.1 pg (ref 26.0–34.0)
MCHC: 30.2 g/dL (ref 30.0–36.0)
MCHC: 32.6 g/dL (ref 30.0–36.0)
MCV: 88.1 fL (ref 78.0–100.0)
MCV: 86.1 fL (ref 78.0–100.0)
MONOS PCT: 5 % (ref 3–12)
Monocytes Absolute: 0.5 10*3/uL (ref 0.1–1.0)
Monocytes Absolute: 0.4 10*3/uL (ref 0.1–1.0)
Monocytes Relative: 6 % (ref 3–12)
Neutro Abs: 5.7 10*3/uL (ref 1.7–7.7)
Neutro Abs: 6.2 10*3/uL (ref 1.7–7.7)
Neutrophils Relative %: 71 % (ref 43–77)
Neutrophils Relative %: 71 % (ref 43–77)
PLATELETS: 369 10*3/uL (ref 150–400)
PLATELETS: 375 10*3/uL (ref 150–400)
RBC: 2.26 MIL/uL — ABNORMAL LOW (ref 4.22–5.81)
RBC: 3.03 MIL/uL — AB (ref 4.22–5.81)
RDW: 15.2 % (ref 11.5–15.5)
RDW: 14.8 % (ref 11.5–15.5)
WBC: 8.1 10*3/uL (ref 4.0–10.5)
WBC: 8.8 10*3/uL (ref 4.0–10.5)

## 2013-05-19 LAB — BASIC METABOLIC PANEL
BUN: 12 mg/dL (ref 6–23)
CO2: 29 mEq/L (ref 19–32)
Calcium: 8.5 mg/dL (ref 8.4–10.5)
Chloride: 98 mEq/L (ref 96–112)
Creatinine, Ser: 1.06 mg/dL (ref 0.50–1.35)
GFR calc Af Amer: >90 mL/min (ref >90)
GFR, EST NON AFRICAN AMERICAN: 78 mL/min — AB (ref >90)
Glucose, Bld: 180 mg/dL — ABNORMAL HIGH (ref 70–99)
POTASSIUM: 4.6 meq/L (ref 3.7–5.3)
Sodium: 136 mEq/L — ABNORMAL LOW (ref 137–147)

## 2013-05-19 LAB — URINE MICROSCOPIC-ADD ON

## 2013-05-19 LAB — GLUCOSE, CAPILLARY
GLUCOSE-CAPILLARY: 182 mg/dL — AB (ref 70–99)
GLUCOSE-CAPILLARY: 246 mg/dL — AB (ref 70–99)
GLUCOSE-CAPILLARY: 218 mg/dL — AB (ref 70–99)

## 2013-05-19 LAB — PREPARE RBC (CROSSMATCH)

## 2013-05-19 LAB — URINALYSIS, ROUTINE W REFLEX MICROSCOPIC
Bilirubin Urine: NEGATIVE
Glucose, UA: NEGATIVE mg/dL
KETONES UR: NEGATIVE mg/dL
LEUKOCYTES UA: NEGATIVE
NITRITE: NEGATIVE
PH: 7 (ref 5.0–8.0)
Protein, ur: 30 mg/dL — AB
Specific Gravity, Urine: 1.008 (ref 1.005–1.030)
Urobilinogen, UA: 0.2 mg/dL (ref 0.0–1.0)

## 2013-05-19 LAB — HAPTOGLOBIN: Haptoglobin: 469 mg/dL — ABNORMAL HIGH (ref 45–215)

## 2013-05-19 LAB — OCCULT BLOOD X 1 CARD TO LAB, STOOL: FECAL OCCULT BLD: NEGATIVE

## 2013-05-19 MED ORDER — FUROSEMIDE 10 MG/ML IJ SOLN
20.0000 mg | Freq: Once | INTRAMUSCULAR | Status: AC
  Administered 2013-05-19: 20 mg via INTRAVENOUS
  Filled 2013-05-19: qty 2

## 2013-05-19 MED ORDER — INSULIN GLARGINE 100 UNIT/ML ~~LOC~~ SOLN
72.0000 [IU] | Freq: Every day | SUBCUTANEOUS | Status: DC
  Administered 2013-05-19 – 2013-05-21 (×2): 72 [IU] via SUBCUTANEOUS
  Filled 2013-05-19 (×2): qty 0.72

## 2013-05-19 MED ORDER — DIPHENHYDRAMINE HCL 25 MG PO CAPS: 25.0000 mg | ORAL_CAPSULE | ORAL | Status: DC | PRN

## 2013-05-19 NOTE — Progress Notes (Signed)
    Day 9 of stay      Patient name: [name]  Medical record number: [id number]  Date of birth: 1960/04/15    I met with patient and examined him. He appears to be doing well. His wounds are healing well, and dressings are dry. He has agreed to get the blood transfusion and since his hgB decreased to 6 he would like to get transfused.   I have read Dr Harley Hallmark note and agree with her documentation and plan of care.  I have discussed the care of this patient with my IM team residents. Please see the resident note for details.  Bush Murdoch 05/19/2013, 12:16 PM.

## 2013-05-19 NOTE — Progress Notes (Signed)
Patient doing well  incisions dry dressing dry  followup with Dr. due to upon discharge

## 2013-05-19 NOTE — Progress Notes (Signed)
Subjective:  Pt seen and examined in AM. No acute events overnight. Pt reports mild drainage from left foot ulcer and left LE swelling.  No fever, dyspnea, fatigue, or lightheadedness. Agreeable to receiving blood today.    Objective: Vital signs in last 24 hours: Filed Vitals:   05/18/13 1130 05/18/13 1710 05/18/13 2135 05/19/13 0453  BP: 133/75 152/77 143/71 131/75  Pulse: 90 93 92 86  Temp: 98.3 F (36.8 C) 98.3 F (36.8 C) 98.2 F (36.8 C) 98 F (36.7 C)  TempSrc: Oral Oral Oral Oral  Resp: 18 18 19 18   Height:      Weight:   216 lb 7.9 oz (98.2 kg)   SpO2: 95% 94% 92% 98%   Weight change: 0 lb (0 kg)  Intake/Output Summary (Last 24 hours) at 05/19/13 0744 Last data filed at 05/19/13 [id number]  Gross per 24 hour  Intake   1430 ml  Output   3266 ml  Net  -1836 ml   Physical Exam:  General: NAD Heart: Regular rate and rhythm Lungs: Clear to ausculation b/l. 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, +2 left LE pitting edema, right +1 edema  Neuro: A &O x 3  Lab Results: Basic Metabolic Panel:  Recent Labs Lab 05/18/13 0515 05/19/13 0437  NA 131* 136*  K 4.4 4.6  CL 94* 98  CO2 28 29  GLUCOSE 270* 180*  BUN 12 12  CREATININE 0.99 1.06  CALCIUM 8.5 8.5   CBC:  Recent Labs Lab 05/16/13 0535  05/18/13 1600 05/19/13 0437  WBC 11.2*  < > 8.7 8.1  NEUTROABS 8.8*  --   --  5.7  HGB 7.3*  < > 6.4* 6.0*  HCT 22.8*  < > 20.4* 19.9*  MCV 85.7  < > 86.4 88.1  PLT 348  < > 364 369  < > = values in this interval not displayed. CBG:  Recent Labs Lab 05/17/13 1644 05/17/13 2127 05/18/13 0823 05/18/13 1127 05/18/13 1709 05/18/13 2133  GLUCAP 141* 191* 274* 294* 217* 140*    Micro Results: Recent Results (from the past 240 hour(s))  URINE CULTURE     Status: None   Collection Time    05/10/13  7:16 PM      Result Value Ref Range Status   Specimen Description URINE, RANDOM   Final   Special Requests NONE   Final   Culture  Setup Time     Final   Value: 05/11/2013 02:42     Performed at [facility]     Final   Value: NO GROWTH     Performed at [facility]   Culture     Final   Value: NO GROWTH     Performed at [facility]   Report Status 05/11/2013 FINAL   Final  CULTURE, BLOOD (ROUTINE X 2)     Status: None   Collection Time    05/10/13 11:24 PM      Result Value Ref Range Status   Specimen Description BLOOD RIGHT FOREARM   Final   Special Requests BOTTLES DRAWN AEROBIC AND ANAEROBIC 10ML   Final   Culture  Setup Time     Final   Value: 05/11/2013 03:52     Performed at [facility]  Final   Value: PROTEUS MIRABILIS     ENTEROCOCCUS SPECIES     Note: SUSCEPTIBILITIES PERFORMED ON PREVIOUS CULTURE WITHIN THE LAST 5 DAYS.     STAPHYLOCOCCUS AUREUS     Note: Susceptibilities performed on previous culture within the last 5 days.   Report Status 05/15/2013 FINAL   Final   Organism ID, Bacteria PROTEUS MIRABILIS   Final  CULTURE, BLOOD (ROUTINE X 2)     Status: None   Collection Time    05/10/13 11:27 PM      Result Value Ref Range Status   Specimen Description BLOOD RIGHT FOREARM   Final   Special Requests BOTTLES DRAWN AEROBIC AND ANAEROBIC 10ML   Final   Culture  Setup Time     Final   Value: 05/11/2013 03:51     Performed at [facility]   Culture     Final   Value: PROTEUS MIRABILIS     Note: SUSCEPTIBILITIES PERFORMED ON PREVIOUS CULTURE WITHIN THE LAST 5 DAYS.     ENTEROCOCCUS SPECIES     METHICILLIN RESISTANT STAPHYLOCOCCUS AUREUS     Note: RIFAMPIN AND GENTAMICIN SHOULD NOT BE USED AS SINGLE DRUGS FOR TREATMENT OF STAPH INFECTIONS. CRITICAL RESULT CALLED TO, READ BACK BY AND VERIFIED WITH: KATHA H@2 :37PM ON 05/14/13 BY DANTS   Report Status 05/15/2013 FINAL   Final   Organism ID, Bacteria  ENTEROCOCCUS SPECIES   Final   Organism ID, Bacteria METHICILLIN RESISTANT STAPHYLOCOCCUS AUREUS   Final  MRSA PCR SCREENING     Status: None   Collection Time    05/11/13  1:45 AM      Result Value Ref Range Status   MRSA by PCR NEGATIVE  NEGATIVE Final   Comment:            The GeneXpert MRSA Assay (FDA     approved for NASAL specimens     only), is one component of a     comprehensive MRSA colonization     surveillance program. It is not     intended to diagnose MRSA     infection nor to guide or     monitor treatment for     MRSA infections.  CULTURE, BLOOD (ROUTINE X 2)     Status: None   Collection Time    05/12/13  1:15 PM      Result Value Ref Range Status   Specimen Description BLOOD LEFT HAND   Final   Special Requests BOTTLES DRAWN AEROBIC AND ANAEROBIC 10CC   Final   Culture  Setup Time     Final   Value: 05/12/2013 17:38     Performed at [facility]   Culture     Final   Value: NO GROWTH 5 DAYS     Performed at [facility]   Report Status 05/18/2013 FINAL   Final  WOUND CULTURE     Status: None   Collection Time    05/17/13 10:56 PM      Result Value Ref Range Status   Specimen Description WOUND LEFT FOOT   Final   Special Requests NONE   Final   Gram Stain     Final   Value: FEW WBC PRESENT,BOTH PMN AND MONONUCLEAR     FEW SQUAMOUS EPITHELIAL CELLS PRESENT     RARE GRAM POSITIVE COCCI     IN PAIRS     Performed at [facility]   Culture     Final  Value: Culture reincubated for better growth     Performed at [place] PENDING   Incomplete   Studies/Results: No results found. Medications: I have reviewed the patient's current medications. Scheduled Meds: . amLODipine  5 mg Oral Daily  . ampicillin-sulbactam (UNASYN) IV  3 g Intravenous Q6H  . aspirin EC  325 mg Oral Daily  . atorvastatin  10 mg Oral q1800  . carvedilol  3.125 mg Oral BID WC  . dextromethorphan-guaiFENesin  1 tablet Oral BID  .  furosemide  20 mg Oral BID  . gabapentin  600 mg Oral TID  . insulin aspart  0-20 Units Subcutaneous TID WC  . insulin aspart  0-5 Units Subcutaneous QHS  . insulin aspart  10 Units Subcutaneous TID WC  . insulin glargine  70 Units Subcutaneous QHS  . sodium chloride  10-40 mL Intracatheter Q12H  . sodium chloride  3 mL Intravenous Q12H  . vancomycin  750 mg Intravenous Q8H   Continuous Infusions: . sodium chloride 20 mL/hr at 05/14/13 1705   PRN Meds:.acetaminophen, albuterol, benzonatate, magnesium hydroxide, ondansetron (ZOFRAN) IV, ondansetron, oxyCODONE-acetaminophen, sodium chloride Assessment/Plan:  Asymptomatic Acute on Chronic Normocytic Anemia - Hg 6.0 this AM below baseline 10 with no active bleeding or hemodynamic instability. Pt is s/p right toe amputation. Anemia panel on 5/13 with reticulocytes (wnl), ferritin(394), iron (15 L), TIBC (159 L), Sat% (9L), folate (9), B12 (430) -Transfuse if Hg<8 --> to receive 2 pRBCs with IV lasix 20 mg  -Monitor CBC -Obtain hemolytic anemia work-up --> neg, awaiting blood smear review -Obtain FOBT --> negative -Repeat UA -Outpatient screening colonoscopy   Left Plantar Ulcer - Minimal drainage. Reported purulent drainage from plantar surface on 5/14.  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 Repeat blood cultures from 5/9 with no growth. TEE on 5/11 with no vegetations. -Appreciate ID consult  -Continue IV vancomycin and unsayn (Day 9/21 total day therapy end date 5/28) ) -[facility] home [facility] for home IV antibiotics -Outpatient ID follow-up scheduled   Hyponatremia - Improved to 136 this AM. Etiology likely due to hypovolemia and hyperglycemia.  -NS bolus as needed  -Monitor BMP -Encourage adequate hydration   Insulin-dependent Type II DM - A1c 11.5 on 05/12/13. Pt at home on metformin 1000 BID,  Lantus 70 U, and Novolog 27 U TID.  -Appreciate diabetes coordinator recommendations -Continue home Lantus 72 U -Novolog 10 U three daily with meals  -Resistant SSI with meals & bedtime -Monitor CBG with meals & bedtime   Hypertension - Currently normotensive.  -Continue home furosemide 20 mg BID, consider increasing  -Continue home carvedilol 3.125 mg BID -Continue newly started amlodipine 5 mg daily  -ACEi/ARB with SE cough   Grade 1 Diastolic Chronic Heart Failure -  Possibly mild exacerbation on admission. Last 2D-echo on 03/28/13 with EF 55-60% and grade 1 dysfunction. Pro-BNP 2639 on admission. -Furosemide 20 mg BID, consider increasing  -Monitor volume status -Daily weight and strict I & O's  Hyperlipidemia - Last lipid panel on 05/12/11 with cholesterol 206, TG 100, HDL 40, LDL 146. Pt at home on crestor 5 mg daily. -Atorvastatin 10 mg daily   Anion Gap Metabolic Acidosis - Resolved.  Most likely due to severe sepsis.  -Monitor BMP  AKI - Resolved. Cr near baseline of 1. Etiology most likely pre-renal azotemia with Fe Urea 51.7 %.  -Monitor BMP   Acute Respiratory Failure in setting of pulmonary edema - Resolved. Etiology possibly due to acute on chronic diastolic CHF. -Oxygen supplementation to keep SpO2 >92% -Albuterol nebulizer Q 6 hr PRN -Continue IV  antibiotics to treat for possible HCAP/aspiration PNA -Incentive spirometry  Diet: Carb modified DVT Ppx:  ASA 325 mg, SCD's  Code: Full  Dispo: Disposition is deferred at this time, awaiting improvement of current medical problems.    The patient does have a current PCP (Neema Bobbie Stack, MD) and does need an [facility] hospital follow-up appointment after discharge.  The patient does not have transportation limitations that hinder transportation to clinic appointments.  .Services Needed at time of discharge: Y = Yes, Blank = No PT:  Yes  OT:  No   RN:  Yes  Equipment:  3-in-1; tub bench, wheelchair cushion  Other:  IV antibiotics     LOS: 9 days   Juluis Mire, MD 05/19/2013, 7:44 AM

## 2013-05-20 DIAGNOSIS — M7989 Other specified soft tissue disorders: Secondary | ICD-10-CM

## 2013-05-20 LAB — CBC
HEMATOCRIT: 24.7 % — AB (ref 39.0–52.0)
Hemoglobin: 7.9 g/dL — ABNORMAL LOW (ref 13.0–17.0)
MCH: 27.7 pg (ref 26.0–34.0)
MCHC: 32 g/dL (ref 30.0–36.0)
MCV: 86.7 fL (ref 78.0–100.0)
PLATELETS: 356 10*3/uL (ref 150–400)
RBC: 2.85 MIL/uL — ABNORMAL LOW (ref 4.22–5.81)
RDW: 15 % (ref 11.5–15.5)
WBC: 8.5 10*3/uL (ref 4.0–10.5)

## 2013-05-20 LAB — BASIC METABOLIC PANEL
BUN: 16 mg/dL (ref 6–23)
CALCIUM: 8.7 mg/dL (ref 8.4–10.5)
CHLORIDE: 97 meq/L (ref 96–112)
CO2: 30 mEq/L (ref 19–32)
Creatinine, Ser: 1.1 mg/dL (ref 0.50–1.35)
GFR calc Af Amer: 87 mL/min — ABNORMAL LOW (ref >90)
GFR calc non Af Amer: 75 mL/min — ABNORMAL LOW (ref >90)
Glucose, Bld: 147 mg/dL — ABNORMAL HIGH (ref 70–99)
POTASSIUM: 4.6 meq/L (ref 3.7–5.3)
Sodium: 136 mEq/L — ABNORMAL LOW (ref 137–147)

## 2013-05-20 LAB — TYPE AND SCREEN
ABO/RH(D): A POS
Antibody Screen: NEGATIVE
UNIT DIVISION: 0
Unit division: 0

## 2013-05-20 LAB — VANCOMYCIN, TROUGH: VANCOMYCIN TR: 20 ug/mL (ref 10.0–20.0)

## 2013-05-20 LAB — GLUCOSE, CAPILLARY
GLUCOSE-CAPILLARY: 155 mg/dL — AB (ref 70–99)
GLUCOSE-CAPILLARY: 155 mg/dL — AB (ref 70–99)
GLUCOSE-CAPILLARY: 138 mg/dL — AB (ref 70–99)
Glucose-Capillary: 166 mg/dL — ABNORMAL HIGH (ref 70–99)
Glucose-Capillary: 222 mg/dL — ABNORMAL HIGH (ref 70–99)

## 2013-05-20 MED ORDER — FUROSEMIDE 10 MG/ML IJ SOLN
20.0000 mg | Freq: Every day | INTRAMUSCULAR | Status: DC
  Administered 2013-05-21 (×2): 20 mg via INTRAVENOUS
  Filled 2013-05-20 (×2): qty 2

## 2013-05-20 NOTE — Progress Notes (Signed)
Pt refusing bed alarm at this time. Provided education regarding safety and risks for falls. Pt verbalized understanding and still refused. Will continue to monitor with door open.    

## 2013-05-20 NOTE — Progress Notes (Signed)
ANTIBIOTIC CONSULT NOTE - FOLLOW UP  Pharmacy Consult for vancomycin  Indication: MRSA bacteremia/osteo  No Known Allergies  Patient Measurements: Height: 6\' 3"  (190.5 cm) Weight: 206 lb 4.8 oz (93.577 kg) IBW/kg (Calculated) : 84.5  Vital Signs: Temp: 97.9 F (36.6 C) (05/17 0507) Temp src: Oral (05/17 0507) BP: 141/71 mmHg (05/17 0507) Pulse Rate: 86 (05/17 0507) Intake/Output from previous day: 05/16 0701 - 05/17 0700 In: 960 [P.O.:960] Out: 3275 [Urine:3275] Intake/Output from this shift:    Labs:  Recent Labs  05/18/13 0515  05/19/13 0437 05/19/13 2030 05/20/13 0430  WBC 10.3  < > 8.1 8.8 8.5  HGB 6.4*  < > 6.0* 8.5* 7.9*  PLT 380  < > 369 375 356  CREATININE 0.99  --  1.06  --  1.10  < > = values in this interval not displayed. Estimated Creatinine Clearance: 92.8 ml/min (by C-G formula based on Cr of 1.1). No results found for this basename: VANCOTROUGH, Corlis Leak, VANCORANDOM, GENTTROUGH, GENTPEAK, GENTRANDOM, TOBRATROUGH, TOBRAPEAK, TOBRARND, AMIKACINPEAK, AMIKACINTROU, AMIKACIN,  in the last 72 hours   Microbiology: Recent Results (from the past 720 hour(s))  URINE CULTURE     Status: None   Collection Time    05/10/13  7:16 PM      Result Value Ref Range Status   Specimen Description URINE, RANDOM   Final   Special Requests NONE   Final   Culture  Setup Time     Final   Value: 05/11/2013 02:42     Performed at [place]     Final   Value: NO GROWTH     Performed at [facility]   Culture     Final   Value: NO GROWTH     Performed at [facility]   Report Status 05/11/2013 FINAL   Final  CULTURE, BLOOD (ROUTINE X 2)     Status: None   Collection Time    05/10/13 11:24 PM      Result Value Ref Range Status   Specimen Description BLOOD RIGHT FOREARM   Final   Special Requests BOTTLES DRAWN AEROBIC AND ANAEROBIC 10ML   Final   Culture  Setup Time     Final   Value: 05/11/2013 03:52     Performed at  [facility]   Culture     Final   Value: PROTEUS MIRABILIS     ENTEROCOCCUS SPECIES     Note: SUSCEPTIBILITIES PERFORMED ON PREVIOUS CULTURE WITHIN THE LAST 5 DAYS.     STAPHYLOCOCCUS AUREUS     Note: Susceptibilities performed on previous culture within the last 5 days.   Report Status 05/15/2013 FINAL   Final   Organism ID, Bacteria PROTEUS MIRABILIS   Final  CULTURE, BLOOD (ROUTINE X 2)     Status: None   Collection Time    05/10/13 11:27 PM      Result Value Ref Range Status   Specimen Description BLOOD RIGHT FOREARM   Final   Special Requests BOTTLES DRAWN AEROBIC AND ANAEROBIC 10ML   Final   Culture  Setup Time     Final   Value: 05/11/2013 03:51     Performed at [facility]   Culture     Final   Value: PROTEUS MIRABILIS     Note: SUSCEPTIBILITIES PERFORMED ON PREVIOUS CULTURE WITHIN THE LAST 5 DAYS.     ENTEROCOCCUS SPECIES     METHICILLIN RESISTANT STAPHYLOCOCCUS AUREUS  Note: RIFAMPIN AND GENTAMICIN SHOULD NOT BE USED AS SINGLE DRUGS FOR TREATMENT OF STAPH INFECTIONS. CRITICAL RESULT CALLED TO, READ BACK BY AND VERIFIED WITH: KATHA H@2 :37PM ON 05/14/13 BY DANTS   Report Status 05/15/2013 FINAL   Final   Organism ID, Bacteria ENTEROCOCCUS SPECIES   Final   Organism ID, Bacteria METHICILLIN RESISTANT STAPHYLOCOCCUS AUREUS   Final  MRSA PCR SCREENING     Status: None   Collection Time    05/11/13  1:45 AM      Result Value Ref Range Status   MRSA by PCR NEGATIVE  NEGATIVE Final   Comment:            The GeneXpert MRSA Assay (FDA     approved for NASAL specimens     only), is one component of a     comprehensive MRSA colonization     surveillance program. It is not     intended to diagnose MRSA     infection nor to guide or     monitor treatment for     MRSA infections.  CULTURE, BLOOD (ROUTINE X 2)     Status: None   Collection Time    05/12/13  1:15 PM      Result Value Ref Range Status   Specimen Description BLOOD LEFT HAND   Final   Special  Requests BOTTLES DRAWN AEROBIC AND ANAEROBIC 10CC   Final   Culture  Setup Time     Final   Value: 05/12/2013 17:38     Performed at [facility]   Culture     Final   Value: NO GROWTH 5 DAYS     Performed at [facility]   Report Status 05/18/2013 FINAL   Final  WOUND CULTURE     Status: None   Collection Time    05/17/13 10:56 PM      Result Value Ref Range Status   Specimen Description WOUND LEFT FOOT   Final   Special Requests NONE   Final   Gram Stain     Final   Value: FEW WBC PRESENT,BOTH PMN AND MONONUCLEAR     FEW SQUAMOUS EPITHELIAL CELLS PRESENT     RARE GRAM POSITIVE COCCI     IN PAIRS     Performed at [facility]   Culture     Final   Value: Culture reincubated for better growth     Performed at [facility]   Report Status PENDING   Incomplete    Anti-infectives   Start     Dose/Rate Route Frequency Ordered Stop   05/17/13 1300  vancomycin (VANCOCIN) IVPB 750 mg/150 ml premix     750 mg 150 mL/hr over 60 Minutes Intravenous Every 8 hours 05/17/13 1147     05/17/13 0000  Vancomycin (VANCOCIN) 750 MG/150ML SOLN     750 mg 150 mL/hr over 60 Minutes Intravenous Every 8 hours 05/17/13 1739 05/31/13 2359   05/17/13 0000  Ampicillin-Sulbactam 3 g in sodium chloride 0.9 % 100 mL     3 g 100 mL/hr over 60 Minutes Intravenous Every 6 hours 05/17/13 1739 05/31/13 2359   05/16/13 0300  vancomycin (VANCOCIN) IVPB 1000 mg/200 mL premix  Status:  Discontinued     1,000 mg 200 mL/hr over 60 Minutes Intravenous Every 8 hours 05/15/13 1927 05/17/13 1147   05/15/13 1200  Ampicillin-Sulbactam (UNASYN) 3 g in sodium chloride 0.9 % 100 mL IVPB     3 g 100  mL/hr over 60 Minutes Intravenous Every 6 hours 05/15/13 1056     05/14/13 1600  ceFAZolin (ANCEF) IVPB 2 g/50 mL premix  Status:  Discontinued     2 g 100 mL/hr over 30 Minutes Intravenous Every 8 hours 05/14/13 1315 05/15/13 1036   05/13/13 0600  vancomycin (VANCOCIN) 1,500 mg in sodium  chloride 0.9 % 500 mL IVPB  Status:  Discontinued     1,500 mg 250 mL/hr over 120 Minutes Intravenous Every 12 hours 05/12/13 2301 05/15/13 1927   05/12/13 1600  ceFAZolin (ANCEF) IVPB 1 g/50 mL premix  Status:  Discontinued     1 g 100 mL/hr over 30 Minutes Intravenous Every 8 hours 05/12/13 1508 05/14/13 1315   05/11/13 0600  piperacillin-tazobactam (ZOSYN) IVPB 3.375 g  Status:  Discontinued     3.375 g 12.5 mL/hr over 240 Minutes Intravenous Every 8 hours 05/10/13 2313 05/12/13 1456   05/10/13 2315  vancomycin (VANCOCIN) IVPB 1000 mg/200 mL premix  Status:  Discontinued     1,000 mg 200 mL/hr over 60 Minutes Intravenous Every 12 hours 05/10/13 2313 05/12/13 2300   05/10/13 2315  piperacillin-tazobactam (ZOSYN) IVPB 3.375 g     3.375 g 100 mL/hr over 30 Minutes Intravenous  Once 05/10/13 2313 05/11/13 0036      Assessment: 53yo male was at pre-admission testing for same-day surgery 5/7 (R toe amputation 2/2 diabetic ulcer) and c/o anorexia, N/V, light-headedness, and rhinorrhea x7d, anesthesia PA recommended sending pt to ED for evaulation, admitted for concern for sepsis, to begin IV ABX.  Day 10 of Vancomycin for MRSA bacteremia/MRI c/w toe osteo, Day 6 of Unasyn for Proteus mirabilis and Enterococcus bacteremia, ID following, plan for 21 total days.  WBC 8.5, patient is afebrile. Repeat blood culture is negative thus far.  Vanc 5/8>> 5/9 VT 11.2 on 1g q12, adj for calc'd VT 18> 1.5g q12h 5/12 VT 12.9 on 1.5g q12, chg 1g q8 5/14 VT 25 (drawn 2.5 hrs early, likely true tr ~20) on 1g q8, decr 750mg  q8h 5/17 VT ordered for 1530 before next dose   Zosyn 5/8>5/9 Cefazolin 5/9>>5/12 Unasyn 5/12>>5/28 Vancomycin 5/7 >5/28  5/7 UCx> ng 5/7 Bcx x2> Proteus mirabilis (pan sens), MRSA, Enterococcus (sens amp) 5/9 BCx>> ngtd 5/14 L foot wound>>   Goal of Therapy:  Vancomycin trough level 15-20 mcg/ml Eradication of infection  Plan:  - Vanc decreased to 750mg  q8h on 5/14. -  Unasyn 3g IV q6h - VT ordered for 1530 before next dose - Monitor renal function, clinical status  Thara Searing B. Leitha Schuller, PharmD Clinical Pharmacist - Resident Phone: [phone] Pager: [phone] 05/20/2013 10:40 AM

## 2013-05-20 NOTE — Progress Notes (Signed)
Subjective:  [name] seen and examined in AM. No acute events overnight.  Tolerated blood transfusion yesterday.  Reports LE swelling better after IV lasix. Unsure if  drainage from left foot ulcer since currently wrapped.  No fever, dyspnea, fatigue, or lightheadedness.    Objective: Vital signs in last 24 hours: Filed Vitals:   05/19/13 1700 05/19/13 2132 05/20/13 0507 05/20/13 0900  BP: 136/39 142/73 141/71 164/88  Pulse: 86 90 86 90  Temp: 98.3 F (36.8 C) 98.2 F (36.8 C) 97.9 F (36.6 C) 98.3 F (36.8 C)  TempSrc: Oral Oral Oral Oral  Resp: 21 18 18 22   Height:      Weight:  206 lb 4.8 oz (93.577 kg)    SpO2: 100% 96% 98% 92%   Weight change: -10 lb 3.1 oz (-4.623 kg)  Intake/Output Summary (Last 24 hours) at 05/20/13 1632 Last data filed at 05/20/13 1307  Gross per 24 hour  Intake    840 ml  Output   5125 ml  Net  -4285 ml   Physical Exam:  General: NAD Heart: Regular rate and rhythm Lungs: Clear to ausculation b/l. 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 with creamy discharge from small ulceration, +1 LE pitting edema b/l Neuro: A &O x 3  Lab Results: Basic Metabolic Panel:  Recent Labs Lab 05/19/13 0437 05/20/13 0430  NA 136* 136*  K 4.6 4.6  CL 98 97  CO2 29 30  GLUCOSE 180* 147*  BUN 12 16  CREATININE 1.06 1.10  CALCIUM 8.5 8.7   CBC:  Recent Labs Lab 05/19/13 0437 05/19/13 2030 05/20/13 0430  WBC 8.1 8.8 8.5  NEUTROABS 5.7 6.2  --   HGB 6.0* 8.5* 7.9*  HCT 19.9* 26.1* 24.7*  MCV 88.1 86.1 86.7  PLT 369 375 356   CBG:  Recent Labs Lab 05/19/13 0808 05/19/13 1152 05/19/13 1705 05/19/13 2129 05/20/13 0820 05/20/13 1223  GLUCAP 182* 246* 218* 155* 155* 166*    Micro Results: Recent Results (from the past 240 hour(s))  URINE CULTURE     Status: None   Collection Time   05/10/13  7:16 PM      Result Value Ref Range Status   Specimen Description URINE, RANDOM   Final   Special Requests NONE   Final   Culture  Setup Time     Final   Value: 05/11/2013 02:42     Performed at [place]     Final   Value: NO GROWTH     Performed at [facility]   Culture     Final   Value: NO GROWTH     Performed at [facility]   Report Status 05/11/2013 FINAL   Final  CULTURE, BLOOD (ROUTINE X 2)     Status: None   Collection Time    05/10/13 11:24 PM      Result Value Ref Range Status   Specimen Description BLOOD RIGHT FOREARM   Final   Special Requests BOTTLES DRAWN AEROBIC AND ANAEROBIC 10ML   Final   Culture  Setup Time     Final   Value: 05/11/2013 03:52     Performed at [facility]   Culture     Final   Value: [place]  Note: SUSCEPTIBILITIES PERFORMED ON PREVIOUS CULTURE WITHIN THE LAST 5 DAYS.     STAPHYLOCOCCUS AUREUS     Note: Susceptibilities performed on previous culture within the last 5 days.   Report Status 05/15/2013 FINAL   Final   Organism ID, Bacteria PROTEUS MIRABILIS   Final  CULTURE, BLOOD (ROUTINE X 2)     Status: None   Collection Time    05/10/13 11:27 PM      Result Value Ref Range Status   Specimen Description BLOOD RIGHT FOREARM   Final   Special Requests BOTTLES DRAWN AEROBIC AND ANAEROBIC 10ML   Final   Culture  Setup Time     Final   Value: 05/11/2013 03:51     Performed at [facility]   Culture     Final   Value: PROTEUS MIRABILIS     Note: SUSCEPTIBILITIES PERFORMED ON PREVIOUS CULTURE WITHIN THE LAST 5 DAYS.     ENTEROCOCCUS SPECIES     METHICILLIN RESISTANT STAPHYLOCOCCUS AUREUS     Note: RIFAMPIN AND GENTAMICIN SHOULD NOT BE USED AS SINGLE DRUGS FOR TREATMENT OF STAPH INFECTIONS. CRITICAL RESULT CALLED TO, READ BACK BY AND VERIFIED WITH: KATHA H@2 :37PM ON 05/14/13 BY DANTS   Report Status 05/15/2013 FINAL   Final   Organism  ID, Bacteria ENTEROCOCCUS SPECIES   Final   Organism ID, Bacteria METHICILLIN RESISTANT STAPHYLOCOCCUS AUREUS   Final  MRSA PCR SCREENING     Status: None   Collection Time    05/11/13  1:45 AM      Result Value Ref Range Status   MRSA by PCR NEGATIVE  NEGATIVE Final   Comment:            The GeneXpert MRSA Assay (FDA     approved for NASAL specimens     only), is one component of a     comprehensive MRSA colonization     surveillance program. It is not     intended to diagnose MRSA     infection nor to guide or     monitor treatment for     MRSA infections.  CULTURE, BLOOD (ROUTINE X 2)     Status: None   Collection Time    05/12/13  1:15 PM      Result Value Ref Range Status   Specimen Description BLOOD LEFT HAND   Final   Special Requests BOTTLES DRAWN AEROBIC AND ANAEROBIC 10CC   Final   Culture  Setup Time     Final   Value: 05/12/2013 17:38     Performed at [facility]   Culture     Final   Value: NO GROWTH 5 DAYS     Performed at [facility]   Report Status 05/18/2013 FINAL   Final  WOUND CULTURE     Status: None   Collection Time    05/17/13 10:56 PM      Result Value Ref Range Status   Specimen Description WOUND LEFT FOOT   Final   Special Requests NONE   Final   Gram Stain     Final   Value: FEW WBC PRESENT,BOTH PMN AND MONONUCLEAR     FEW SQUAMOUS EPITHELIAL CELLS PRESENT     RARE GRAM POSITIVE COCCI     IN PAIRS     Performed at [facility]   Culture     Final   Value: Culture reincubated for better growth     Performed at [facility]  Report Status PENDING   Incomplete   Studies/Results: No results found. Medications: I have reviewed the patient's current medications. Scheduled Meds: . amLODipine  5 mg Oral Daily  . ampicillin-sulbactam (UNASYN) IV  3 g Intravenous Q6H  . aspirin EC  325 mg Oral Daily  . atorvastatin  10 mg Oral q1800  . carvedilol  3.125 mg Oral BID WC  . dextromethorphan-guaiFENesin  1 tablet  Oral BID  . furosemide  20 mg Intravenous Daily  . gabapentin  600 mg Oral TID  . insulin aspart  0-20 Units Subcutaneous TID WC  . insulin aspart  0-5 Units Subcutaneous QHS  . insulin aspart  10 Units Subcutaneous TID WC  . insulin glargine  72 Units Subcutaneous QHS  . sodium chloride  10-40 mL Intracatheter Q12H  . sodium chloride  3 mL Intravenous Q12H  . vancomycin  750 mg Intravenous Q8H   Continuous Infusions: . sodium chloride 10 mL/hr at 05/19/13 2206   PRN Meds:.acetaminophen, albuterol, benzonatate, diphenhydrAMINE, magnesium hydroxide, ondansetron (ZOFRAN) IV, ondansetron, oxyCODONE-acetaminophen, sodium chloride Assessment/Plan:  Asymptomatic Acute on Chronic Normocytic Anemia - Hg improved to 7.9 s/p 2 pRBC on 5/26. Hg still below baseline 10 with no active bleeding or hemodynamic instability. [name] is s/p right toe amputation. Anemia panel on 5/13 with reticulocytes (wnl), ferritin(394), iron (15 L), TIBC (159 L), Sat% (9L), folate (9), B12 (430) -Transfuse if Hg<7   -Monitor CBC -Obtain hemolytic anemia work-up --> neg, awaiting blood smear review -Obtain FOBT --> negative -Repeat UA --> trace hematuria -Outpatient screening colonoscopy   Left Plantar Ulcer - Minimal drainage. Reported purulent drainage from plantar surface on 5/14.  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 Repeat blood cultures from 5/9 with no growth. TEE on 5/11 with no vegetations. -Appreciate ID consult  -Continue IV vancomycin and unsayn (Day 10/21 total day therapy end date 5/28) ) -[facility] for home IV antibiotics -Outpatient ID follow-up scheduled   Hyponatremia - 136 this AM. Etiology likely due to hypovolemia and hyperglycemia.  -NS bolus as needed  -Monitor BMP -Encourage adequate hydration   Insulin-dependent Type II DM - A1c 11.5 on 05/12/13. [name] at home on metformin 1000 BID,  Lantus 70 U, and Novolog 27 U TID.  -Appreciate diabetes coordinator recommendations -Continue home Lantus 72 U -Novolog 10 U three daily with meals  -Resistant SSI with meals & bedtime -Monitor CBG with meals & bedtime   Hypertension - Currently hypertensive.  -IV lasix 20 mg daily, hold home furosemide 20 mg BID -Continue home carvedilol 3.125 mg BID -Continue newly started amlodipine 5 mg daily  -ACEi/ARB with SE cough   Grade 1 Diastolic Chronic Heart Failure - Possibly mild exacerbation on admission. Last 2D-echo on 03/28/13 with  EF 55-60% and grade 1 dysfunction. Pro-BNP 2639 on admission. -IV lasix 20 mg daily, hold home furosemide 20 mg BID -Monitor volume status -Daily weight and strict I & O's  Hyperlipidemia - Last lipid panel on 05/12/11 with cholesterol 206, TG 100, HDL 40, LDL 146. [name] at home on crestor 5 mg daily. -Atorvastatin 10 mg daily   Anion Gap Metabolic Acidosis - Resolved.  Most likely due to severe sepsis.  -Monitor BMP  AKI - Resolved. Cr near baseline of 1. Etiology most likely pre-renal azotemia with Fe Urea 51.7 %.  -Monitor BMP   Acute Respiratory Failure in setting of pulmonary edema - Resolved. Etiology possibly due to acute on chronic diastolic CHF. -Oxygen supplementation  to keep SpO2 >92% -Albuterol nebulizer Q 6 hr PRN   Diet: Carb modified DVT Ppx:  ASA 325 mg, SCD's  Code: Full  Dispo: Disposition is deferred at this time, awaiting improvement of current medical problems.    The patient does have a current PCP (Neema Bobbie Stack, MD) and does need an [facility] hospital follow-up appointment after discharge.  The patient does not have transportation limitations that hinder transportation to clinic appointments.  .Services Needed at time of discharge: Y = Yes, Blank = No [name]:  Yes  OT:  No   RN:  Yes  Equipment:  3-in-1; tub bench, wheelchair cushion  Other:  IV antibiotics     LOS: 10 days   Juluis Mire, MD 05/20/2013, 4:32 PM

## 2013-05-20 NOTE — Progress Notes (Signed)
ANTIBIOTIC CONSULT NOTE - FOLLOW UP  Pharmacy Consult for vancomycin  Indication: MRSA bacteremia/osteo  No Known Allergies  Patient Measurements: Height: 6\' 3"  (190.5 cm) Weight: 206 lb 4.8 oz (93.577 kg) IBW/kg (Calculated) : 84.5  Vital Signs: Temp: 98.3 F (36.8 C) (05/17 0900) Temp src: Oral (05/17 0900) BP: 164/88 mmHg (05/17 0900) Pulse Rate: 90 (05/17 0900) Intake/Output from previous day: 05/16 0701 - 05/17 0700 In: 960 [P.O.:960] Out: 3275 [Urine:3275] Intake/Output from this shift: Total I/O In: 240 [P.O.:240] Out: 2500 [Urine:2500]  Labs:  Recent Labs  05/18/13 0515  05/19/13 0437 05/19/13 2030 05/20/13 0430  WBC 10.3  < > 8.1 8.8 8.5  HGB 6.4*  < > 6.0* 8.5* 7.9*  PLT 380  < > 369 375 356  CREATININE 0.99  --  1.06  --  1.10  < > = values in this interval not displayed. Estimated Creatinine Clearance: 92.8 ml/min (by C-G formula based on Cr of 1.1).  Recent Labs  05/20/13 1540  [place] 20.0     Microbiology: Recent Results (from the past 720 hour(s))  URINE CULTURE     Status: None   Collection Time    05/10/13  7:16 PM      Result Value Ref Range Status   Specimen Description URINE, RANDOM   Final   Special Requests NONE   Final   Culture  Setup Time     Final   Value: 05/11/2013 02:42     Performed at [place]     Final   Value: NO GROWTH     Performed at [facility]   Culture     Final   Value: NO GROWTH     Performed at [facility]   Report Status 05/11/2013 FINAL   Final  CULTURE, BLOOD (ROUTINE X 2)     Status: None   Collection Time    05/10/13 11:24 PM      Result Value Ref Range Status   Specimen Description BLOOD RIGHT FOREARM   Final   Special Requests BOTTLES DRAWN AEROBIC AND ANAEROBIC 10ML   Final   Culture  Setup Time     Final   Value: 05/11/2013 03:52     Performed at [facility]   Culture     Final   Value: PROTEUS MIRABILIS     ENTEROCOCCUS SPECIES    Note: SUSCEPTIBILITIES PERFORMED ON PREVIOUS CULTURE WITHIN THE LAST 5 DAYS.     STAPHYLOCOCCUS AUREUS     Note: Susceptibilities performed on previous culture within the last 5 days.   Report Status 05/15/2013 FINAL   Final   Organism ID, Bacteria PROTEUS MIRABILIS   Final  CULTURE, BLOOD (ROUTINE X 2)     Status: None   Collection Time    05/10/13 11:27 PM      Result Value Ref Range Status   Specimen Description BLOOD RIGHT FOREARM   Final   Special Requests BOTTLES DRAWN AEROBIC AND ANAEROBIC 10ML   Final   Culture  Setup Time     Final   Value: 05/11/2013 03:51     Performed at [facility]   Culture     Final   Value: PROTEUS MIRABILIS     Note: SUSCEPTIBILITIES PERFORMED ON PREVIOUS CULTURE WITHIN THE LAST 5 DAYS.     ENTEROCOCCUS SPECIES     METHICILLIN RESISTANT STAPHYLOCOCCUS AUREUS     Note: RIFAMPIN AND GENTAMICIN SHOULD NOT BE USED  AS SINGLE DRUGS FOR TREATMENT OF STAPH INFECTIONS. CRITICAL RESULT CALLED TO, READ BACK BY AND VERIFIED WITH: KATHA H@2 :37PM ON 05/14/13 BY DANTS   Report Status 05/15/2013 FINAL   Final   Organism ID, Bacteria ENTEROCOCCUS SPECIES   Final   Organism ID, Bacteria METHICILLIN RESISTANT STAPHYLOCOCCUS AUREUS   Final  MRSA PCR SCREENING     Status: None   Collection Time    05/11/13  1:45 AM      Result Value Ref Range Status   MRSA by PCR NEGATIVE  NEGATIVE Final   Comment:            The GeneXpert MRSA Assay (FDA     approved for NASAL specimens     only), is one component of a     comprehensive MRSA colonization     surveillance program. It is not     intended to diagnose MRSA     infection nor to guide or     monitor treatment for     MRSA infections.  CULTURE, BLOOD (ROUTINE X 2)     Status: None   Collection Time    05/12/13  1:15 PM      Result Value Ref Range Status   Specimen Description BLOOD LEFT HAND   Final   Special Requests BOTTLES DRAWN AEROBIC AND ANAEROBIC 10CC   Final   Culture  Setup Time     Final   Value:  05/12/2013 17:38     Performed at [facility]   Culture     Final   Value: NO GROWTH 5 DAYS     Performed at [facility]   Report Status 05/18/2013 FINAL   Final  WOUND CULTURE     Status: None   Collection Time    05/17/13 10:56 PM      Result Value Ref Range Status   Specimen Description WOUND LEFT FOOT   Final   Special Requests NONE   Final   Gram Stain     Final   Value: FEW WBC PRESENT,BOTH PMN AND MONONUCLEAR     FEW SQUAMOUS EPITHELIAL CELLS PRESENT     RARE GRAM POSITIVE COCCI     IN PAIRS     Performed at [facility]   Culture     Final   Value: Culture reincubated for better growth     Performed at [facility]   Report Status PENDING   Incomplete    Anti-infectives   Start     Dose/Rate Route Frequency Ordered Stop   05/17/13 1300  vancomycin (VANCOCIN) IVPB 750 mg/150 ml premix     750 mg 150 mL/hr over 60 Minutes Intravenous Every 8 hours 05/17/13 1147     05/17/13 0000  Vancomycin (VANCOCIN) 750 MG/150ML SOLN     750 mg 150 mL/hr over 60 Minutes Intravenous Every 8 hours 05/17/13 1739 05/31/13 2359   05/17/13 0000  Ampicillin-Sulbactam 3 g in sodium chloride 0.9 % 100 mL     3 g 100 mL/hr over 60 Minutes Intravenous Every 6 hours 05/17/13 1739 05/31/13 2359   05/16/13 0300  vancomycin (VANCOCIN) IVPB 1000 mg/200 mL premix  Status:  Discontinued     1,000 mg 200 mL/hr over 60 Minutes Intravenous Every 8 hours 05/15/13 1927 05/17/13 1147   05/15/13 1200  Ampicillin-Sulbactam (UNASYN) 3 g in sodium chloride 0.9 % 100 mL IVPB     3 g 100 mL/hr over 60 Minutes Intravenous Every 6 hours  05/15/13 1056     05/14/13 1600  ceFAZolin (ANCEF) IVPB 2 g/50 mL premix  Status:  Discontinued     2 g 100 mL/hr over 30 Minutes Intravenous Every 8 hours 05/14/13 1315 05/15/13 1036   05/13/13 0600  vancomycin (VANCOCIN) 1,500 mg in sodium chloride 0.9 % 500 mL IVPB  Status:  Discontinued     1,500 mg 250 mL/hr over 120 Minutes Intravenous  Every 12 hours 05/12/13 2301 05/15/13 1927   05/12/13 1600  ceFAZolin (ANCEF) IVPB 1 g/50 mL premix  Status:  Discontinued     1 g 100 mL/hr over 30 Minutes Intravenous Every 8 hours 05/12/13 1508 05/14/13 1315   05/11/13 0600  piperacillin-tazobactam (ZOSYN) IVPB 3.375 g  Status:  Discontinued     3.375 g 12.5 mL/hr over 240 Minutes Intravenous Every 8 hours 05/10/13 2313 05/12/13 1456   05/10/13 2315  vancomycin (VANCOCIN) IVPB 1000 mg/200 mL premix  Status:  Discontinued     1,000 mg 200 mL/hr over 60 Minutes Intravenous Every 12 hours 05/10/13 2313 05/12/13 2300   05/10/13 2315  piperacillin-tazobactam (ZOSYN) IVPB 3.375 g     3.375 g 100 mL/hr over 30 Minutes Intravenous  Once 05/10/13 2313 05/11/13 0036      Assessment: 53yo male was at pre-admission testing for same-day surgery 5/7 (R toe amputation 2/2 diabetic ulcer) and c/o anorexia, N/V, light-headedness, and rhinorrhea x7d, anesthesia PA recommended sending pt to ED for evaulation, admitted for concern for sepsis, to begin IV ABX.  Day 10 of Vancomycin for MRSA bacteremia/MRI c/w toe osteo, Day 6 of Unasyn for Proteus mirabilis and Enterococcus bacteremia, ID following, plan for 21 total days.  WBC 8.5, patient is afebrile. Repeat blood culture is negative thus far.  Vanc 5/8>> 5/9 VT 11.2 on 1g q12, adj for calc'd VT 18> 1.5g q12h 5/12 VT 12.9 on 1.5g q12, chg 1g q8 5/14 VT 25 (drawn 2.5 hrs early, likely true tr ~20) on 1g q8, decr 750mg  q8h 5/17 VT 20  Zosyn 5/8>5/9 Cefazolin 5/9>>5/12 Unasyn 5/12>>5/28 Vancomycin 5/7 >5/28  5/7 UCx> ng 5/7 Bcx x2> Proteus mirabilis (pan sens), MRSA, Enterococcus (sens amp) 5/9 BCx>> ngtd 5/14 L foot wound>>   Goal of Therapy:  Vancomycin trough level 15-20 mcg/ml Eradication of infection  Plan:  1. Continue vancomycin 750mg  IV Q8H for now 2. F/u renal fxn, C&S, clinical status and recheck trough in a few days  Salome Arnt, PharmD, BCPS Pager # [phone] 05/20/2013  4:56 PM

## 2013-05-20 NOTE — Progress Notes (Signed)
VASCULAR LAB PRELIMINARY  PRELIMINARY  PRELIMINARY  PRELIMINARY  Bilateral lower extremity venous duplex  completed.    Preliminary report:  Bilateral:  No evidence of DVT, superficial thrombosis, or Baker's Cyst.    Nani Ravens, RVT 05/20/2013, 3:43 PM

## 2013-05-21 DIAGNOSIS — N179 Acute kidney failure, unspecified: Secondary | ICD-10-CM

## 2013-05-21 LAB — CBC WITH DIFFERENTIAL/PLATELET
Basophils Absolute: 0 10*3/uL (ref 0.0–0.1)
Basophils Relative: 0 % (ref 0–1)
Eosinophils Absolute: 0.1 10*3/uL (ref 0.0–0.7)
Eosinophils Relative: 1 % (ref 0–5)
HCT: 26.9 % — ABNORMAL LOW (ref 39.0–52.0)
HEMOGLOBIN: 8.6 g/dL — AB (ref 13.0–17.0)
LYMPHS ABS: 1.6 10*3/uL (ref 0.7–4.0)
LYMPHS PCT: 18 % (ref 12–46)
MCH: 27.7 pg (ref 26.0–34.0)
MCHC: 32 g/dL (ref 30.0–36.0)
MCV: 86.8 fL (ref 78.0–100.0)
MONOS PCT: 6 % (ref 3–12)
Monocytes Absolute: 0.6 10*3/uL (ref 0.1–1.0)
NEUTROS ABS: 6.7 10*3/uL (ref 1.7–7.7)
NEUTROS PCT: 75 % (ref 43–77)
PLATELETS: 391 10*3/uL (ref 150–400)
RBC: 3.1 MIL/uL — AB (ref 4.22–5.81)
RDW: 14.9 % (ref 11.5–15.5)
WBC: 8.9 10*3/uL (ref 4.0–10.5)

## 2013-05-21 LAB — BASIC METABOLIC PANEL
BUN: 19 mg/dL (ref 6–23)
CALCIUM: 9.3 mg/dL (ref 8.4–10.5)
CO2: 28 mEq/L (ref 19–32)
Chloride: 94 mEq/L — ABNORMAL LOW (ref 96–112)
Creatinine, Ser: 1.07 mg/dL (ref 0.50–1.35)
GFR, EST AFRICAN AMERICAN: 90 mL/min — AB (ref >90)
GFR, EST NON AFRICAN AMERICAN: 77 mL/min — AB (ref >90)
GLUCOSE: 267 mg/dL — AB (ref 70–99)
POTASSIUM: 4.4 meq/L (ref 3.7–5.3)
Sodium: 134 mEq/L — ABNORMAL LOW (ref 137–147)

## 2013-05-21 LAB — GLUCOSE, CAPILLARY
GLUCOSE-CAPILLARY: 309 mg/dL — AB (ref 70–99)
Glucose-Capillary: 184 mg/dL — ABNORMAL HIGH (ref 70–99)

## 2013-05-21 LAB — PATHOLOGIST SMEAR REVIEW

## 2013-05-21 MED ORDER — AMLODIPINE BESYLATE 5 MG PO TABS: 5.0000 mg | ORAL_TABLET | Freq: Every day | ORAL | Status: DC

## 2013-05-21 MED ORDER — HEPARIN SOD (PORK) LOCK FLUSH 100 UNIT/ML IV SOLN
250.0000 [IU] | INTRAVENOUS | Status: AC | PRN
  Administered 2013-05-21: 250 [IU]

## 2013-05-21 MED ORDER — SILVER SULFADIAZINE 1 % EX CREA
TOPICAL_CREAM | Freq: Every day | CUTANEOUS | Status: DC
Start: 2013-05-21 — End: 2013-05-21
  Administered 2013-05-21: 11:00:00 via TOPICAL
  Filled 2013-05-21: qty 85

## 2013-05-21 NOTE — Progress Notes (Signed)
Discharge instruction given to patient,patient verbalized understanding.Prescription for norvasc with patient.Prescription for antibiotics with the Home health RN. 15:20 Patient discharged to home,accompanied by relative. Rise Traeger Lurline Hare, RN

## 2013-05-21 NOTE — Discharge Instructions (Signed)
-  Take aspirin 325 mg daily (starting tomm 5/19) -Continue non-weight bearing to your right foot/leg -Home health RN will come do dressing changes  -Start taking amlodipine daily for your blood pressure (starting tomm 5/19) It is $5 at [facility] and $7 at [facility] antibiotic (vancoymcin and unasyn) stop date is 05/31/13 -Continue your previous medications as previously prescribed -Please attend your hospital follow-up appointments  -Pleasure taking care of you - hope you were satisfied with our care!

## 2013-05-21 NOTE — Progress Notes (Signed)
Patient ID: [name], male   DOB: Apr 08, 1960, 53 y.o.   MRN: [id number] Patient has developed a new blister on his left foot from maintaining nonweightbearing on the right foot. The blistered area is approximately 5 cm in diameter. We will have a Silvadene dressing change started for the left foot. A right foot wound [name] very minimal drainage the maceration is improving. The wound edges are still well approximated. Continue protected weightbearing on the right. Silvadene dressing changes to start on the right.

## 2013-05-21 NOTE — Progress Notes (Signed)
Occupational Therapy Treatment Patient Details Name: [name] MRN: [id number] DOB: Jul 25, 1960 Today's Date: 05/21/2013    History of present illness Pt is a 53 y/o male admitted s/p amputation of right first ray and amputation of second digit on right. 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 reports his left foot began draining last night after a walk to the bathroom with nursing staff.  Dressing iis intact with mild drainage noted through bandaging.   OT comments  Pt currently at adequate level for d/c home from OT standpoint. Pt has family (A) for bathing/ dressing and meals upon d/c. Pt declines further OT session and agreeable with sign off. Question need for drop arm 3n1 upon d/c but patient declines at this time. Ot to signoff. All education is complete and patient indicates understanding.   Follow Up Recommendations  No OT follow up    Equipment Recommendations  None recommended by OT    Recommendations for Other Services      Precautions / Restrictions Precautions Precautions: Fall Precaution Comments: L midfoot amputation previously, now draining and bandage in place Restrictions RLE Weight Bearing: Non weight bearing       Mobility Bed Mobility Overal bed mobility: Modified Independent                Transfers                 General transfer comment: declined at this time . RN notified of refusal and need for dressing change.     Balance                                   ADL                                         General ADL Comments: Pt supine on arrival and aroused with name call. Pt declining OOB at this time due LT foot wound and waiting dressing change. pt expressed great concern over small blister  now with incr breakdown. pt states "no one seems like its a big deal but I know its a big deal. 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 Its a big  deal. I could lose what is left of my foot." pt educated on use of stool for bed mobility at home. Pt and OT discussed at 3 different points during session the use of 3n1 at home. Pt declines 3n1 at this time. If patient becomes agreeable to 3n1. OT recommending drop arm 3n1 so that if patient becomes bil LE NWB he can transfer from w/c level. Pt has w/c at home currently. Pt agreeable to OT sign off adn expressed no need for help with adls at this time.       Vision                     Perception     Praxis      Cognition   Behavior During Therapy: [facility] for tasks assessed/performed Overall Cognitive Status: Within Functional Limits for tasks assessed                       Extremity/Trunk Assessment  Exercises     Shoulder Instructions       General Comments      Pertinent Vitals/ Pain       Not reporting pain at this time but requesting dressing change on Lt foot.                                                                                  RN aware and to address LT LE  Home Living                                          Prior Functioning/Environment              Frequency       Progress Toward Goals  OT Goals(current goals can now be found in the care plan section)  Progress towards OT goals: Goals met/education completed, patient discharged from OT  Acute Rehab OT Goals Patient Stated Goal: To return home ADL Goals Pt Will Perform Grooming: with modified independence;standing Pt Will Perform Lower Body Dressing: with modified independence;sit to/from stand Pt Will Transfer to Toilet: with modified independence;ambulating;regular height toilet Pt Will Perform Toileting - Clothing Manipulation and hygiene: with modified independence;sit to/from stand  Plan Discharge plan remains appropriate    Co-evaluation                 End of Session     Activity Tolerance Patient tolerated treatment  well   Patient Left in bed;with call bell/phone within reach   Nurse Communication Mobility status        Time: 4098-1191 OT Time Calculation (min): 23 min  Charges: OT General Charges $OT Visit: 1 Procedure OT Treatments $Self Care/Home Management : 8-22 mins  Peri Maris 05/21/2013, 10:19 AM Pager: [phone]

## 2013-05-21 NOTE — Progress Notes (Signed)
Subjective:  Pt seen and examined in AM. No acute events overnight. Hg improved to 8.6. Dry skin on left foot was removed today. Reports LE swelling much improved after IV lasix.     Objective: Vital signs in last 24 hours: Filed Vitals:   05/20/13 1700 05/20/13 2152 05/21/13 0617 05/21/13 0814  BP: 134/82 145/82 161/83 161/89  Pulse: 91 93 91 96  Temp: 98.4 F (36.9 C) 98.5 F (36.9 C) 98.5 F (36.9 C) 98.8 F (37.1 C)  TempSrc: Oral Oral Oral Oral  Resp: 24 22 20 20   Height:  6\' 3"  (1.905 m)    Weight:  90.583 kg (199 lb 11.2 oz)    SpO2: 93% 92% 92% 93%   Weight change: -2.994 kg (-6 lb 9.6 oz)  Intake/Output Summary (Last 24 hours) at 05/21/13 1306 Last data filed at 05/21/13 1207  Gross per 24 hour  Intake   1440 ml  Output   6895 ml  Net  -5455 ml   Physical Exam:  General: NAD Heart: Regular rate and rhythm Lungs: Clear to ausculation b/l. 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 with skin debridement  and minimal discharge, trace b/l LE pitting edema  Neuro: A &O x 3  Lab Results: Basic Metabolic Panel:  Recent Labs Lab 05/20/13 0430 05/21/13 0542  NA 136* 134*  K 4.6 4.4  CL 97 94*  CO2 30 28  GLUCOSE 147* 267*  BUN 16 19  CREATININE 1.10 1.07  CALCIUM 8.7 9.3   CBC:  Recent Labs Lab 05/19/13 2030 05/20/13 0430 05/21/13 0542  WBC 8.8 8.5 8.9  NEUTROABS 6.2  --  6.7  HGB 8.5* 7.9* 8.6*  HCT 26.1* 24.7* 26.9*  MCV 86.1 86.7 86.8  PLT 375 356 391   CBG:  Recent Labs Lab 05/20/13 0820 05/20/13 1223 05/20/13 1703 05/20/13 2148 05/21/13 0813 05/21/13 1148  GLUCAP 155* 166* 138* 222* 309* 184*    Micro Results: Recent Results (from the past 240 hour(s))  CULTURE, BLOOD (ROUTINE X 2)     Status: None   Collection Time    05/12/13  1:15 PM      Result Value Ref Range Status   Specimen Description BLOOD LEFT HAND   Final   Special Requests BOTTLES DRAWN AEROBIC AND ANAEROBIC 10CC   Final   Culture  Setup Time     Final   Value: 05/12/2013 17:38     Performed at [facility]   Culture     Final   Value: NO GROWTH 5 DAYS     Performed at [facility]   Report Status 05/18/2013 FINAL   Final  WOUND CULTURE     Status: None   Collection Time    05/17/13 10:56 PM      Result Value Ref Range Status   Specimen Description WOUND LEFT FOOT   Final   Special Requests NONE   Final   Gram Stain     Final   Value: FEW WBC PRESENT,BOTH PMN AND MONONUCLEAR     FEW SQUAMOUS EPITHELIAL CELLS PRESENT     RARE GRAM POSITIVE COCCI     IN PAIRS     Performed at [facility]   Culture     Final   Value: Culture reincubated for better growth  Performed at [facility]   Report Status PENDING   Incomplete   Studies/Results: No results found. Medications: I have reviewed the patient's current medications. Scheduled Meds: . amLODipine  5 mg Oral Daily  . ampicillin-sulbactam (UNASYN) IV  3 g Intravenous Q6H  . aspirin EC  325 mg Oral Daily  . atorvastatin  10 mg Oral q1800  . carvedilol  3.125 mg Oral BID WC  . dextromethorphan-guaiFENesin  1 tablet Oral BID  . furosemide  20 mg Intravenous Daily  . gabapentin  600 mg Oral TID  . insulin aspart  0-20 Units Subcutaneous TID WC  . insulin aspart  0-5 Units Subcutaneous QHS  . insulin aspart  10 Units Subcutaneous TID WC  . insulin glargine  72 Units Subcutaneous QHS  . silver sulfADIAZINE   Topical Daily  . sodium chloride  10-40 mL Intracatheter Q12H  . sodium chloride  3 mL Intravenous Q12H  . vancomycin  750 mg Intravenous Q8H   Continuous Infusions: . sodium chloride 10 mL/hr at 05/19/13 2206   PRN Meds:.acetaminophen, albuterol, benzonatate, diphenhydrAMINE, magnesium hydroxide, ondansetron (ZOFRAN) IV, ondansetron, oxyCODONE-acetaminophen, sodium  chloride Assessment/Plan:  Asymptomatic Acute on Chronic Normocytic Anemia - Hg improved to 8.6  s/p 2 pRBC on 5/26. Hg still below baseline 10 with no active bleeding or hemodynamic instability. Pt is s/p right toe amputation. Anemia panel on 5/13 with reticulocytes (wnl), ferritin(394), iron (15 L), TIBC (159 L), Sat% (9L), folate (9), B12 (430), consistent with ACD.  -Transfuse if Hg<7   -Monitor CBC -Obtain hemolytic anemia work-up --> neg -Outpatient screening colonoscopy   Left Plantar Ulcer - s/p skin debridement today.  Reported purulent drainage from plantar surface on 5/14.  -F/U wound culture 5/14 ---> NGTD -Dressing changes per nursing  -Silvadene dressing changes    Severe sepsis due to Osteomyelitis of Right Great Toe s/p amputation of 1st & 2nd Ray amputation on 5/8 - no current sepsis criteria -Appreciate orthopedic surgery following  -Continue IV vancomycin and unsayn (Day 10/21 total day therapy end date 5/28) -Monitor CBC and fever curve  -Percocet/Roxicet 1-2 tbs Q 4hr PRN -Gabapentin 600 mg TID  -PT/OT therapies -Home health PT  -Non-weight bearing to right LE -Aspirin 325 mg daily (for 4 weeks) and SCD's for DVT PPx -Silvadene dressing changes  -Outpatient orthopedic follow-up scheduled  Proteus Mirabilis, MRSA, & Enterococcus Bacteremia - Pt with blood cultures (2/2) from 5/7 with pansensitive  Proteus Mirabilis, MRSA, and pansensitive Enterococcus. Repeat blood cultures from 5/9 with no growth. TEE on 5/11 with no vegetations. -Appreciate ID consult  -Continue IV vancomycin and unsayn (Day 11/21 total day therapy end date 5/28) ) -[facility] for home IV antibiotics -Outpatient ID follow-up scheduled   Hypovolemic Hyponatremia - 134 this AM. Etiology likely due to hypovolemia in setting of diuretic therapy.  -NS bolus as needed  -Monitor BMP -Encourage adequate hydration   Insulin-dependent Type II DM - A1c 11.5 on 05/12/13. Pt at home on  metformin 1000 BID,  Lantus 70 U, and Novolog 27 U TID.  -Appreciate diabetes coordinator recommendations -Continue home Lantus 72 U -Novolog 10 U three daily with meals  -Resistant SSI with meals & bedtime -Monitor CBG with meals & bedtime   Hypertension - Currently hypertensive.  -IV lasix 20 mg daily, hold home furosemide 20 mg BID -Continue home carvedilol 3.125 mg BID -Continue newly started amlodipine 5 mg daily and on discharge -ACEi/ARB with SE cough   Grade 1 Diastolic Chronic  Heart Failure - Possibly mild exacerbation on admission. Last 2D-echo on 03/28/13 with EF 55-60% and grade 1 dysfunction. Pro-BNP 2639 on admission. -IV lasix 20 mg daily, hold home furosemide 20 mg BID -Monitor volume status -Daily weight and strict I & O's  Hyperlipidemia - Last lipid panel on 05/12/11 with cholesterol 206, TG 100, HDL 40, LDL 146. Pt at home on crestor 5 mg daily. -Atorvastatin 10 mg daily   Anion Gap Metabolic Acidosis - Resolved.  Most likely due to severe sepsis.  -Monitor BMP  AKI - Resolved. Cr near baseline of 1. Etiology most likely pre-renal azotemia with Fe Urea 51.7 %.  -Monitor BMP   Acute Respiratory Failure in setting of pulmonary edema - Resolved. Etiology possibly due to acute on chronic diastolic CHF. -Oxygen supplementation to keep SpO2 >92% -Albuterol nebulizer Q 6 hr PRN   Diet: Carb modified DVT Ppx:  ASA 325 mg, SCD's  Code: Full  Dispo: Disposition is deferred at this time, awaiting improvement of current medical problems.    The patient does have a current PCP (Neema Bobbie Stack, MD) and does need an [facility] hospital follow-up appointment after discharge.  The patient does not have transportation limitations that hinder transportation to clinic appointments.  .Services Needed at time of discharge: Y = Yes, Blank = No PT:  Yes  OT:  No   RN:  Yes  Equipment:  3-in-1; tub bench, wheelchair cushion  Other:  IV antibiotics     LOS: 11 days   Juluis Mire, MD 05/21/2013, 1:06 PM

## 2013-05-21 NOTE — Progress Notes (Signed)
Occupational Therapy Discharge Patient Details Name: [name] MRN: [id number] DOB: 09/07/1960 Today's Date: 05/21/2013 Time: 4097-3532 OT Time Calculation (min): 23 min  Patient discharged from OT services secondary to goals met and no further OT needs identified.  Please see latest therapy progress note for current level of functioning and progress toward goals.    Progress and discharge plan discussed with patient and/or caregiver: Patient/Caregiver agrees with plan  GO     Peri Maris Pager: [phone]  05/21/2013, 10:21 AM

## 2013-05-21 NOTE — Consult Note (Signed)
WOC consult requested for bilat feet.  Dr Sharol Given of the ortho service is following for post-op assessment and plan of care.  Please refer to his progress note from 5/18 and call his service if further questions. Please re-consult if further assistance is needed.  Thank-you,  Julien Girt MSN, [place], [place], [place], [place]

## 2013-05-22 ENCOUNTER — Emergency Department (HOSPITAL_COMMUNITY)
Admission: EM | Admit: 2013-05-22 | Discharge: 2013-05-22 | Disposition: A | Discharge status: Home / Self Care | Payer: Medicaid Other | Attending: Emergency Medicine | Admitting: Emergency Medicine

## 2013-05-22 ENCOUNTER — Ambulatory Visit: Payer: No Typology Code available for payment source | Admitting: Internal Medicine

## 2013-05-22 ENCOUNTER — Encounter (HOSPITAL_COMMUNITY): Payer: Self-pay | Admitting: Emergency Medicine

## 2013-05-22 DIAGNOSIS — Z9889 Other specified postprocedural states: Secondary | ICD-10-CM | POA: Insufficient documentation

## 2013-05-22 DIAGNOSIS — Z9861 Coronary angioplasty status: Secondary | ICD-10-CM | POA: Insufficient documentation

## 2013-05-22 DIAGNOSIS — Z8701 Personal history of pneumonia (recurrent): Secondary | ICD-10-CM | POA: Insufficient documentation

## 2013-05-22 DIAGNOSIS — Z794 Long term (current) use of insulin: Secondary | ICD-10-CM | POA: Insufficient documentation

## 2013-05-22 DIAGNOSIS — T82598A Other mechanical complication of other cardiac and vascular devices and implants, initial encounter: Secondary | ICD-10-CM | POA: Insufficient documentation

## 2013-05-22 DIAGNOSIS — Z79899 Other long term (current) drug therapy: Secondary | ICD-10-CM | POA: Insufficient documentation

## 2013-05-22 DIAGNOSIS — Z8669 Personal history of other diseases of the nervous system and sense organs: Secondary | ICD-10-CM | POA: Insufficient documentation

## 2013-05-22 DIAGNOSIS — E1149 Type 2 diabetes mellitus with other diabetic neurological complication: Secondary | ICD-10-CM | POA: Insufficient documentation

## 2013-05-22 DIAGNOSIS — I10 Essential (primary) hypertension: Secondary | ICD-10-CM | POA: Insufficient documentation

## 2013-05-22 DIAGNOSIS — Z8739 Personal history of other diseases of the musculoskeletal system and connective tissue: Secondary | ICD-10-CM | POA: Insufficient documentation

## 2013-05-22 DIAGNOSIS — I509 Heart failure, unspecified: Secondary | ICD-10-CM | POA: Insufficient documentation

## 2013-05-22 DIAGNOSIS — Z8679 Personal history of other diseases of the circulatory system: Secondary | ICD-10-CM | POA: Insufficient documentation

## 2013-05-22 DIAGNOSIS — Z7982 Long term (current) use of aspirin: Secondary | ICD-10-CM | POA: Insufficient documentation

## 2013-05-22 DIAGNOSIS — Y832 Surgical operation with anastomosis, bypass or graft as the cause of abnormal reaction of the patient, or of later complication, without mention of misadventure at the time of the procedure: Secondary | ICD-10-CM | POA: Insufficient documentation

## 2013-05-22 DIAGNOSIS — Z87891 Personal history of nicotine dependence: Secondary | ICD-10-CM | POA: Insufficient documentation

## 2013-05-22 DIAGNOSIS — E1142 Type 2 diabetes mellitus with diabetic polyneuropathy: Secondary | ICD-10-CM | POA: Insufficient documentation

## 2013-05-22 DIAGNOSIS — Z792 Long term (current) use of antibiotics: Secondary | ICD-10-CM | POA: Insufficient documentation

## 2013-05-22 DIAGNOSIS — T82898A Other specified complication of vascular prosthetic devices, implants and grafts, initial encounter: Secondary | ICD-10-CM

## 2013-05-22 DIAGNOSIS — E785 Hyperlipidemia, unspecified: Secondary | ICD-10-CM | POA: Insufficient documentation

## 2013-05-22 DIAGNOSIS — Z8719 Personal history of other diseases of the digestive system: Secondary | ICD-10-CM | POA: Insufficient documentation

## 2013-05-22 LAB — CBC WITH DIFFERENTIAL/PLATELET
BASOS PCT: 0 % (ref 0–1)
Basophils Absolute: 0 10*3/uL (ref 0.0–0.1)
Eosinophils Absolute: 0.1 10*3/uL (ref 0.0–0.7)
Eosinophils Relative: 1 % (ref 0–5)
HEMATOCRIT: 31.7 % — AB (ref 39.0–52.0)
Hemoglobin: 9.9 g/dL — ABNORMAL LOW (ref 13.0–17.0)
Lymphocytes Relative: 15 % (ref 12–46)
Lymphs Abs: 1.3 10*3/uL (ref 0.7–4.0)
MCH: 27.5 pg (ref 26.0–34.0)
MCHC: 31.2 g/dL (ref 30.0–36.0)
MCV: 88.1 fL (ref 78.0–100.0)
MONO ABS: 0.5 10*3/uL (ref 0.1–1.0)
MONOS PCT: 6 % (ref 3–12)
NEUTROS ABS: 6.9 10*3/uL (ref 1.7–7.7)
Neutrophils Relative %: 78 % — ABNORMAL HIGH (ref 43–77)
Platelets: 395 10*3/uL (ref 150–400)
RBC: 3.6 MIL/uL — ABNORMAL LOW (ref 4.22–5.81)
RDW: 14.5 % (ref 11.5–15.5)
WBC: 8.7 10*3/uL (ref 4.0–10.5)

## 2013-05-22 LAB — BASIC METABOLIC PANEL
BUN: 17 mg/dL (ref 6–23)
CHLORIDE: 94 meq/L — AB (ref 96–112)
CO2: 27 meq/L (ref 19–32)
Calcium: 9.4 mg/dL (ref 8.4–10.5)
Creatinine, Ser: 1.06 mg/dL (ref 0.50–1.35)
GFR calc Af Amer: >90 mL/min (ref >90)
GFR calc non Af Amer: 78 mL/min — ABNORMAL LOW (ref >90)
Glucose, Bld: 219 mg/dL — ABNORMAL HIGH (ref 70–99)
Potassium: 4.7 mEq/L (ref 3.7–5.3)
Sodium: 132 mEq/L — ABNORMAL LOW (ref 137–147)

## 2013-05-22 MED ORDER — SODIUM CHLORIDE 0.9 % IV SOLN
3.0000 g | Freq: Once | INTRAVENOUS | Status: AC
  Administered 2013-05-22: 3 g via INTRAVENOUS
  Filled 2013-05-22: qty 3

## 2013-05-22 MED ORDER — HEPARIN SOD (PORK) LOCK FLUSH 100 UNIT/ML IV SOLN
500.0000 [IU] | Freq: Once | INTRAVENOUS | Status: AC
  Administered 2013-05-22: 500 [IU]
  Filled 2013-05-22: qty 5

## 2013-05-22 MED ORDER — ALTEPLASE 100 MG IV SOLR
2.0000 mg | Freq: Once | INTRAVENOUS | Status: AC
  Administered 2013-05-22: 2 mg
  Filled 2013-05-22: qty 2
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
## 2013-05-22 NOTE — ED Notes (Signed)
Pt comfortable with discharge and follow up instructions. No prescriptions. 

## 2013-05-22 NOTE — ED Notes (Signed)
Pt has picc line right arm, reports getting antibiotic today and went to start second infusion this afternoon and unable to flush his line. No acute distress noted at triage.

## 2013-05-22 NOTE — ED Notes (Signed)
PICC line Patent, 37mL Blood return wasted, flushed with 30mL NSS

## 2013-05-22 NOTE — Discharge Instructions (Signed)
PICC Home Guide A peripherally inserted central catheter (PICC) is a long, thin, flexible tube that is inserted into a vein in the upper arm. It is a form of intravenous (IV) access. It is considered to be a "central" line because the tip of the PICC ends in a large vein in your chest. This large vein is called the superior vena cava (SVC). The PICC tip ends in the SVC because there is a lot of blood flow in the SVC. This allows medicines and IV fluids to be quickly distributed throughout the body. The PICC is inserted using a sterile technique by a specially trained nurse or physician. After the PICC is inserted, a chest X-ray exam is done to be sure it is in the correct place.  A PICC may be placed for different reasons, such as:  To give medicines and liquid nutrition that can only be given through a central line. Examples are:  Certain antibiotic treatments.  Chemotherapy.  Total parenteral nutrition (TPN).  To take frequent blood samples.  To give IV fluids and blood products.  If there is difficulty placing a peripheral intravenous (PIV) catheter. If taken care of properly, a PICC can remain in place for several months. A PICC can also allow a person to go home from the hospital early. Medicine and PICC care can be managed at home by a family member or home health care team. WHAT PROBLEMS CAN HAPPEN WHEN I HAVE A PICC? Problems with a PICC can occasionally occur. These may include:  A blood clot (thrombus) forming in or at the tip of the PICC. This can cause the PICC to become clogged. A clot-dissolving medicine called tissue plasminogen activator (tPA) can be given through the PICC to help break up the clot.  Inflammation of the vein (phlebitis) in which the PICC is placed. Signs of inflammation may include redness, pain at the insertion site, red streaks, or being able to feel a "cord" in the vein where the PICC is located.  Infection in the PICC or at the insertion site. Signs of  infection may include fever, chills, redness, swelling, or pus drainage from the PICC insertion site.  PICC movement (malposition). The PICC tip may move from its original position due to excessive physical activity, forceful coughing, sneezing, or vomiting.  A break or cut in the PICC. It is important to not use scissors near the PICC.  Nerve or tendon irritation or injury during PICC insertion. WHAT SHOULD I KEEP IN MIND ABOUT ACTIVITIES WHEN I HAVE A PICC?  You may bend your arm and move it freely. If your PICC is near or at the bend of your elbow, avoid activity with repeated motion at the elbow.  Rest at home for the remainder of the day following PICC line insertion.  Avoid lifting heavy objects as instructed by your health care provider.  Avoid using a crutch with the arm on the same side as your PICC. You may need to use a walker. WHAT SHOULD I KNOW ABOUT MY PICC DRESSING?  Keep your PICC bandage (dressing) clean and dry to prevent infection.  Ask your health care provider when you may shower. Ask your health care provider to teach you how to wrap the PICC when you do take a shower.  Change the PICC dressing as instructed by your health care provider.  Change your PICC dressing if it becomes loose or wet. WHAT SHOULD I KNOW ABOUT PICC CARE?  Check the PICC insertion site daily for   leakage, redness, swelling, or pain.  Do not take a bath, swim, or use hot tubs when you have a PICC. Cover PICC line with clear plastic wrap and tape to keep it dry while showering.  Flush the PICC as directed by your health care provider. Let your health care provider know right away if the PICC is difficult to flush or does not flush. Do not use force to flush the PICC.  Do not use a syringe that is less than 10 mL to flush the PICC.  Never pull or tug on the PICC.  Avoid blood pressure checks on the arm with the PICC.  Keep your PICC identification card with you at all times.  Do not  take the PICC out yourself. Only a trained clinical professional should remove the PICC. SEEK IMMEDIATE MEDICAL CARE IF:  Your PICC is accidently pulled all the way out. If this happens, cover the insertion site with a bandage or gauze dressing. Do not throw the PICC away. Your health care provider will need to inspect it.  Your PICC was tugged or pulled and has partially come out. Do not  push the PICC back in.  There is any type of drainage, redness, or swelling where the PICC enters the skin.  You cannot flush the PICC, it is difficult to flush, or the PICC leaks around the insertion site when it is flushed.  You hear a "flushing" sound when the PICC is flushed.  You have pain, discomfort, or numbness in your arm, shoulder, or jaw on the same side as the PICC.  You feel your heart "racing" or skipping beats.  You notice a hole or tear in the PICC.  You develop chills or a fever. MAKE SURE YOU:   Understand these instructions.  Will watch your condition.  Will get help right away if you are not doing well or get worse. Document Released: 06/27/2002 Document Revised: 10/11/2012 Document Reviewed: 08/28/2012 [employer] Patient Information 2014 [employer].  

## 2013-05-22 NOTE — ED Notes (Signed)
IV Team at bedside to give TPA

## 2013-05-23 LAB — WOUND CULTURE

## 2013-05-23 NOTE — ED Provider Notes (Signed)
I saw and evaluated the patient, reviewed the resident's note and I agree with the findings and plan.   EKG Interpretation None      [name] is a 53 y.o. male hx of osteomyelitis here with occluded PICC line. Was d/c yesterday with PICC line. Suppose to finish unasyn. However, PICC line clotted today. Given TPA by IV team. PICC line operational. Given unasyn in the ED. May use PICC line now.    Wandra Arthurs, MD 05/23/13 [phone]

## 2013-05-24 ENCOUNTER — Encounter (HOSPITAL_COMMUNITY): Payer: Self-pay | Admitting: Emergency Medicine

## 2013-05-24 ENCOUNTER — Emergency Department (HOSPITAL_COMMUNITY)
Admission: EM | Admit: 2013-05-24 | Discharge: 2013-05-24 | Disposition: A | Discharge status: Home / Self Care | Payer: Medicaid Other | Attending: Emergency Medicine | Admitting: Emergency Medicine

## 2013-05-24 DIAGNOSIS — Z8701 Personal history of pneumonia (recurrent): Secondary | ICD-10-CM | POA: Insufficient documentation

## 2013-05-24 DIAGNOSIS — E1142 Type 2 diabetes mellitus with diabetic polyneuropathy: Secondary | ICD-10-CM | POA: Insufficient documentation

## 2013-05-24 DIAGNOSIS — Z7982 Long term (current) use of aspirin: Secondary | ICD-10-CM | POA: Insufficient documentation

## 2013-05-24 DIAGNOSIS — I10 Essential (primary) hypertension: Secondary | ICD-10-CM | POA: Insufficient documentation

## 2013-05-24 DIAGNOSIS — Z9861 Coronary angioplasty status: Secondary | ICD-10-CM | POA: Insufficient documentation

## 2013-05-24 DIAGNOSIS — Z452 Encounter for adjustment and management of vascular access device: Secondary | ICD-10-CM

## 2013-05-24 DIAGNOSIS — I509 Heart failure, unspecified: Secondary | ICD-10-CM | POA: Insufficient documentation

## 2013-05-24 DIAGNOSIS — E785 Hyperlipidemia, unspecified: Secondary | ICD-10-CM | POA: Insufficient documentation

## 2013-05-24 DIAGNOSIS — Z794 Long term (current) use of insulin: Secondary | ICD-10-CM | POA: Insufficient documentation

## 2013-05-24 DIAGNOSIS — Z8719 Personal history of other diseases of the digestive system: Secondary | ICD-10-CM | POA: Insufficient documentation

## 2013-05-24 DIAGNOSIS — M86679 Other chronic osteomyelitis, unspecified ankle and foot: Secondary | ICD-10-CM | POA: Insufficient documentation

## 2013-05-24 DIAGNOSIS — Z8673 Personal history of transient ischemic attack (TIA), and cerebral infarction without residual deficits: Secondary | ICD-10-CM | POA: Insufficient documentation

## 2013-05-24 DIAGNOSIS — S98139A Complete traumatic amputation of one unspecified lesser toe, initial encounter: Secondary | ICD-10-CM | POA: Insufficient documentation

## 2013-05-24 DIAGNOSIS — E1149 Type 2 diabetes mellitus with other diabetic neurological complication: Secondary | ICD-10-CM | POA: Insufficient documentation

## 2013-05-24 DIAGNOSIS — Z79899 Other long term (current) drug therapy: Secondary | ICD-10-CM | POA: Insufficient documentation

## 2013-05-24 DIAGNOSIS — Z87891 Personal history of nicotine dependence: Secondary | ICD-10-CM | POA: Insufficient documentation

## 2013-05-24 DIAGNOSIS — I739 Peripheral vascular disease, unspecified: Secondary | ICD-10-CM | POA: Insufficient documentation
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
## 2013-05-24 NOTE — ED Notes (Signed)
Port flushed per IV team. Pt alert x4 respirations easy non labored.

## 2013-05-24 NOTE — Discharge Instructions (Signed)
KEEP YOUR MEDICATION SCHEDULE AS PRESCRIBED. RETURN HERE WITH ANY FURTHER PICC LINE PROBLEMS.

## 2013-05-24 NOTE — ED Notes (Signed)
Iv team paged.

## 2013-05-24 NOTE — ED Notes (Signed)
Iv team paged 2nd time

## 2013-05-24 NOTE — ED Notes (Signed)
Pt c/o clogged PICC line. Pt has a PICC line in right arm and is currently received antibiotics. Pt reports this morning unable to get infusion due to clogged PICC line. Pt reports that this has happened in the past. Pt thinks it may be caused by him sleeping on his right side.

## 2013-05-25 ENCOUNTER — Encounter: Payer: Self-pay | Admitting: Internal Medicine

## 2013-05-25 ENCOUNTER — Ambulatory Visit (INDEPENDENT_AMBULATORY_CARE_PROVIDER_SITE_OTHER): Payer: No Typology Code available for payment source | Admitting: Internal Medicine

## 2013-05-25 VITALS — BP 146/84 | HR 102 | Temp 97.6°F | Wt 190.3 lb

## 2013-05-25 DIAGNOSIS — L97509 Non-pressure chronic ulcer of other part of unspecified foot with unspecified severity: Secondary | ICD-10-CM

## 2013-05-25 DIAGNOSIS — IMO0002 Reserved for concepts with insufficient information to code with codable children: Secondary | ICD-10-CM

## 2013-05-25 DIAGNOSIS — I509 Heart failure, unspecified: Secondary | ICD-10-CM

## 2013-05-25 DIAGNOSIS — B952 Enterococcus as the cause of diseases classified elsewhere: Secondary | ICD-10-CM

## 2013-05-25 DIAGNOSIS — B964 Proteus (mirabilis) (morganii) as the cause of diseases classified elsewhere: Secondary | ICD-10-CM

## 2013-05-25 DIAGNOSIS — A4902 Methicillin resistant Staphylococcus aureus infection, unspecified site: Secondary | ICD-10-CM

## 2013-05-25 DIAGNOSIS — D649 Anemia, unspecified: Secondary | ICD-10-CM

## 2013-05-25 DIAGNOSIS — D539 Nutritional anemia, unspecified: Secondary | ICD-10-CM

## 2013-05-25 DIAGNOSIS — K219 Gastro-esophageal reflux disease without esophagitis: Secondary | ICD-10-CM

## 2013-05-25 DIAGNOSIS — E1165 Type 2 diabetes mellitus with hyperglycemia: Secondary | ICD-10-CM

## 2013-05-25 DIAGNOSIS — I10 Essential (primary) hypertension: Secondary | ICD-10-CM

## 2013-05-25 DIAGNOSIS — E118 Type 2 diabetes mellitus with unspecified complications: Secondary | ICD-10-CM

## 2013-05-25 DIAGNOSIS — E1169 Type 2 diabetes mellitus with other specified complication: Secondary | ICD-10-CM

## 2013-05-25 DIAGNOSIS — R7881 Bacteremia: Secondary | ICD-10-CM | POA: Insufficient documentation

## 2013-05-25 DIAGNOSIS — E11621 Type 2 diabetes mellitus with foot ulcer: Secondary | ICD-10-CM

## 2013-05-25 DIAGNOSIS — E785 Hyperlipidemia, unspecified: Secondary | ICD-10-CM

## 2013-05-25 LAB — LIPID PANEL
CHOL/HDL RATIO: 3.7 ratio
Cholesterol: 131 mg/dL (ref 0–200)
HDL: 35 mg/dL — ABNORMAL LOW (ref >39)
LDL Cholesterol: 76 mg/dL (ref 0–99)
Triglycerides: 101 mg/dL (ref <150)
VLDL: 20 mg/dL (ref 0–40)

## 2013-05-25 MED ORDER — PANTOPRAZOLE SODIUM 40 MG PO TBEC: 40.0000 mg | DELAYED_RELEASE_TABLET | Freq: Every day | ORAL | Status: DC

## 2013-05-25 MED ORDER — FUROSEMIDE 40 MG PO TABS: 40.0000 mg | ORAL_TABLET | Freq: Every day | ORAL | Status: DC

## 2013-05-25 MED ORDER — AMLODIPINE BESYLATE 5 MG PO TABS: 5.0000 mg | ORAL_TABLET | Freq: Every day | ORAL | Status: DC

## 2013-05-25 NOTE — Discharge Summary (Signed)
Name: [name] MRN: [id number] DOB: [birth year] 53 y.o. PCP: [name], MD  Date of Admission: 05/10/2013  5:10 PM Date of Discharge: 05/21/2013  Attending Physician: [name], MD   Discharge Diagnosis:  Primary:  Osteomyelitis of Right Great Toe s/p amputation of 1st & 2nd Ray  Proteus Mirabilis, MRSA, & Enterococcus Bacteremia  Asymptomatic Acute on Chronic Normocytic Anemia Left Plantar Ulcer  Insulin-dependent Type II DM Hypertension Acute on Chronic Grade 1 Diastolic CHF  Acute Kidney Injury Hypovolemic Hyponatremia Anion Gap Metabolic Acidosis Hyperlipidemia DVT Prophylaxis       Discharge Medications:   Medication List         albuterol 108 (90 BASE) MCG/ACT inhaler  Commonly known as:  PROVENTIL HFA;VENTOLIN HFA  Inhale 1-2 puffs into the lungs every 6 (six) hours as needed for wheezing or shortness of breath.     amLODipine 5 MG tablet  Commonly known as:  NORVASC  Take 1 tablet (5 mg total) by mouth daily.     Ampicillin-Sulbactam 3 g in sodium chloride 0.9 % 100 mL  Inject 3 g into the vein every 6 (six) hours.     aspirin 325 MG EC tablet  Take 325 mg by mouth daily.     carvedilol 3.125 MG tablet  Commonly known as:  COREG  Take 3.125 mg by mouth 2 (two) times daily with a meal.     furosemide 20 MG tablet  Commonly known as:  LASIX  Take 20 mg by mouth 2 (two) times daily.     gabapentin 300 MG capsule  Commonly known as:  NEURONTIN  Take 600 mg by mouth 3 (three) times daily.     insulin aspart 100 UNIT/ML injection  Commonly known as:  novoLOG  Inject 27 Units into the skin 3 (three) times daily before meals.     insulin glargine 100 UNIT/ML injection  Commonly known as:  LANTUS  Inject 70 Units into the skin at bedtime.     metFORMIN 1000 MG tablet  Commonly known as:  GLUCOPHAGE  Take 1,000 mg by mouth 2 (two) times daily with a meal.     rosuvastatin 5 MG tablet  Commonly known as:  CRESTOR  Take 5 mg by mouth  at bedtime.     traMADol 50 MG tablet  Commonly known as:  ULTRAM  Take 50 mg by mouth every 6 (six) hours as needed for moderate pain.     Vancomycin 750 MG/150ML Soln  Commonly known as:  VANCOCIN  Inject 150 mLs (750 mg total) into the vein every 8 (eight) hours.        Disposition and follow-up:   [name] was discharged from [facility] in Good condition.  At the hospital follow up visit please address:  1.  Bacteremia - IV vancomycin and unasyn until 05/31/13       Right toe amputation  - non-weight bearing, aspirin 325 mg daily x 4 weeks,  PT                 Anemia - if stable      Left wound ulcer - Cultures resulted with MRSA after discharge; wound care, ID follow-up, consider XR of left foot       Blood pressure - newly started on amlodipine (if able to fill)         Screening colonoscopy       Trace hematuria - repeat UA    2.  Labs / imaging needed at time of follow-up: BMP CBC (H/H), BMP (Na, Cr), UA, consider Left foot XR  3.  Pending labs/ test needing follow-up: None  Follow-up Appointments: Follow-up Information   Follow up with [name], MD On 05/31/2013. (Appointment at 1:30PM)    Specialty:  Orthopedic Surgery   Contact information:   [place] [address] [phone]       Follow up with Fransisca Kaufmann, MD On 05/25/2013. (Appointment at 2:00 PM)    Specialty:  Internal Medicine   Contact information:   [place] [address] [phone]       Follow up with Carlyle Basques, MD On 05/30/2013. (9:00 AM)    Specialty:  Infectious Diseases   Contact information:   [place] [address] [phone]       Discharge Instructions:  -Take aspirin 325 mg daily (starting tomm 5/19) -Continue non-weight bearing to your right foot/leg -Home health RN will come do dressing changes  -Start taking amlodipine daily for your blood pressure (starting tomm 5/19)  It is $5 at [employer] and $7 at [facility] antibiotic (vancoymcin and unasyn) stop date is 05/31/13 -Continue your previous medications as previously prescribed -Please attend your hospital follow-up appointments  -Pleasure taking care of you - hope you were satisfied with our care!   Discharge Instructions   Continue PICC at discharge    Complete by:  As directed   Flush PICC line per home health policy:  Yes           Consultations:  Orthopedic surgery Infectious Diseases  Procedures Performed:  Dg Chest 2 View  05/11/2013   CLINICAL DATA:  dyspnea  EXAM: CHEST  2 VIEW  COMPARISON:  DG CHEST 1V PORT dated 05/10/2013  FINDINGS: Low lung volumes. Cardiac silhouette is enlarged. The area of increased density within the right hemithorax has improved in the interim consistent with improved pulmonary edema. There is mild residual density right lung base. No new focal regions of consolidation and focal infiltrates. No acute osseous abnormalities.  IMPRESSION: Resolving asymmetric edema on the right. No acute cardiopulmonary disease.   Electronically Signed   By: Margaree Mackintosh M.D.   On: 05/11/2013 20:06   Mr Foot Right W Wo Contrast  05/11/2013   CLINICAL DATA:  Diabetic foot ulcer.  Osteomyelitis.  EXAM: MRI OF THE RIGHT FOREFOOT WITHOUT AND WITH CONTRAST  TECHNIQUE: Multiplanar, multisequence MR imaging was performed both before and after administration of intravenous contrast.  CONTRAST:  58mL MULTIHANCE GADOBENATE DIMEGLUMINE 529 MG/ML IV SOLN  COMPARISON:  05/10/2013  FINDINGS: Prior amputation of the fifth digit at the mid metatarsal level.  The diffuse abnormal osseous edema and enhancement in the distal phalanx great toe with geographic abnormal osseous edema and enhancement in the proximal phalanx great toe compatible with osteomyelitis. There is ulceration along the plantar side of the interphalangeal joint of the great toe with severe soft tissue inflammation and possible exposure of the  distal flexor hallucis longus.  There is patchy edema in a speckled pattern throughout the first metatarsal but without significant accompanying enhancement. There is poor fat saturation in the fourth toe reducing sensitivity for osteomyelitis in the fourth toe, although given the fairly high T1 signal in the phalanges of the fourth toe I doubt that there is osteomyelitis involving the fourth toe.  Lisfranc ligament intact. Abnormal edema and enhancement observed along the plantar musculature of the foot and in  the dorsal subcutaneous tissues compatible with cellulitis and myositis.  IMPRESSION: 1. Active osteomyelitis in the phalanges of the great toe, with severe plantar ulceration along the interphalangeal joint of the great toe and extensive adjacent cellulitis. Probable myositis along the plantar musculature of the foot. 2. Patchy edema signal in the first metatarsal but without associated enhancement, accordingly likely reactive or from osteoporosis of disuse rather than due to active infection in the first metatarsal. 3. Prior fifth digit amputation.   Electronically Signed   By: Sherryl Barters M.D.   On: 05/11/2013 08:58   Dg Chest Port 1 View  05/10/2013   CLINICAL DATA:  Sudden worsening of shortness of breath  EXAM: PORTABLE CHEST - 1 VIEW  COMPARISON:  Chest x-ray from the same day at 3:02 p.m.  FINDINGS: New, fairly dense right base interstitial and airspace disease. Stable heart size for technique. Unremarkable upper mediastinal contours. No pneumothorax or significant effusion.  IMPRESSION: Rapid onset right-sided airspace disease. Timing favors aspiration, asymmetric pulmonary edema, or rapidly progressive pneumonia.   Electronically Signed   By: Jorje Guild M.D.   On: 05/10/2013 21:21   Dg Abd Acute W/chest  05/10/2013   CLINICAL DATA:  Emesis and fatigue for 5 days.  EXAM: ACUTE ABDOMEN SERIES (ABDOMEN 2 VIEW & CHEST 1 VIEW)  COMPARISON:  DG CHEST 2 VIEW dated 03/27/2013  FINDINGS:  Cardiomediastinal silhouette is unremarkable. Lungs are clear, no pleural effusions. No pneumothorax. Soft tissue planes and included osseous structures are unremarkable.  Bowel gas pattern is nondilated and nonobstructive. No intra-abdominal mass effect, pathologic calcifications or free air. Soft tissue planes and included osseous structures are nonsuspicious. Mild to moderate vascular calcifications.  IMPRESSION: Negative abdominal radiographs.  No acute cardiopulmonary disease.   Electronically Signed   By: Elon Alas   On: 05/10/2013 15:15   Dg Foot 2 Views Right  05/10/2013   CLINICAL DATA:  Patient has ulcer on the right foot. Scheduled for surgery tomorrow  EXAM: RIGHT FOOT - 2 VIEW  COMPARISON:  None.  FINDINGS: There is evidence of prior amputation of the left fifth metatarsal with the base remaining. There is bone destruction of the first distal phalanx in the distal aspect of the first proximal phalanx consistent with osteomyelitis. There is an overlying soft tissue ulcer.  The remainder of the osseous structures demonstrate no focal abnormality. There is no fracture or dislocation.  IMPRESSION: Bone destruction of the distal aspect of the first proximal phalanx and the first distal phalanx most consistent with osteomyelitis.   Electronically Signed   By: Kathreen Devoid   On: 05/10/2013 19:42    2D Echo: None  Cardiac Cath: None  Admission HPI: Original Author Zenon Mayo, DO  Rhyley Zilch is a 53 yo AA male with PMH of uncontrolled DM2, HTN, HLD, h/o left foot osteomyelitis s/p transmetatarsal amputation. He reports that back in January he was taking out trash to the dumpster in his slippers and bumped his right great toe, this began to bleed. He was able to control the bleeding but the wound did not heal. He saught help in the [facility] in febuary. He was started on Cipro and Doxycycline and referred top the wound care center. His wound has failed to improved and he was scheduled for  amputation with Dr. Sharol Given on 05/11/13. He presented to [facility] for preadmission testing on 05/10/13, and was noted to be febrile, tachycardiac and reported feeling very dizzy. He was subsequently sent to the ED for evaluatation.  And IMTS was called to admit for possible sepsis.   He is accompanied in the ED by his mother and sister. He reports that for the fast few days he has felt weaker especially when going outside. Has felt subjective fever and chills. He reports he has felt like he is "about to fall out." He has felt nauseous for the past 2 days and has had multiple episodes of NBNB emesis. He note he has had hiccups and heartburn for the past 3 days. He has never had an EGD or colonoscopy. He denies abdominal pain, chest pain, hematochezia, melena, SOB.   After our encounter patient's ED nurse called that patient was becoming increasingly SOB, she placed him on 2L via [place], then increased to 4L, and then called respiratory who placed patient on BiPAP. ABG was obtained which showed an Acute Respiratory Alkalosis.  Hospital Course by problem list:    Osteomyelitis of Right Great Toe s/p amputation of 1st & 2nd Ray - Pt presented with nonhealing right foot wound ulcer found to have severe sepsis. Right foot XR on 5/7 revealed bone destruction of the distal aspect of the first proximal phalanx and the first distal phalanx. MRI right foot on 5/7 revealed active osteomyelitis in the phalanges of the great toe, with severe plantar ulceration along the interphalangeal joint of the great toe and extensive adjacent cellulitis. Pt underwent right 1st and 2nd toe amputation on 5/8 by Dr. Sharol Given. Pt initially received vacomcyin and zosyn which was later changed to ancef and then unsayn. Pt received IV vancomycin and IN unasyn for 11 days and to continue for 10 more days for 21 total day therapy with end date 5/28. Pt's pain was well controlled during hospitalization. Pt also received gabapentin 600 mg TID during  hospitalization for peripheral neuropathy. Pt received PT and OT during hospitalization and to have home health PT. Per orthopedic recommendations, pt to have non-weight bearing to right LE, take aspirin 325 mg daily (for 4 weeks), and apply silvadene dressing changes. Pt to close follow-up with orthopedic surgery.     Proteus Mirabilis, MRSA, & Enterococcus Bacteremia - Pt with blood cultures (2/2) from 5/7 with pansensitive Proteus Mirabilis, MRSA, and pansensitive Enterococcus. Repeat blood cultures from 5/9 with no growth. TEE on 5/11 with no vegetations. Pt to continue IV vancomycin and unsayn which received for 11 days during hospitalization and to continue for 10 additional days for 21 total day therapy with end date 5/28. Pt to have advanced home health care for administration of IV antibiotics through PICC line placed on 5/10. Pt to have close hospital follow-up with ID.   Asymptomatic Acute on Chronic Normocytic Anemia - Pt with Hg low of 6 that required 2 pRBC blood transfusion on 5/16 that improved to near baseline Hg 10 with no active bleeding or hemodynamic instability. Anemia panel on 5/13 with reticulocytes (wnl), ferritin(394), iron (15 L), TIBC (159 L), Sat% (9L), folate (9), B12 (430), consistent with ACD. FOBT was negative. UA revealed trace hematuria. Hemolytic anemia work-up was negative. Pt should have outpatient screening colonoscopy.   Left Plantar Ulcer - Pt with purulent drainage from plantar surface on 5/14 after several days of full weight pressure on foot. Pt s/p skin debridement on 5/18 by Dr. Sharol Given. Pt with wound culture that revealed moderate MRSA (final report after discharge). Pt to continue IV vancomycin for 3 total week therapy and have close ID follow-up. Pt to have sillvadene dressing changes also at home.  Insulin-dependent Type II DM - Last A1c 11.5 on 05/12/13. Pt at home on metformin 1000 BID, Lantus 70 U, and Novolog 27 U TID which pt was instructed to continue on  discharge. Pt was followed by diabetes coordinator. Pt received Lantus 35-72 U, SSI with meals and at bedtime, and Novolog 5-10 U with meals with glucose range of 123-427 during hospitalization.   Hypertension - Pt with blood pressure range of 91/61 to 194/85 during hospitalization. Pt received home carvedilol 3.125 mg BID, furosemide 20 mg BID, and was newly started on amlodipine 5 mg and instructed to continue these medications on discharge.   Acute on Chronic Grade 1 Diastolic CHF - Pt with last 2D-echo on 03/28/13 with EF 55-60% and grade 1 dysfunction. Pro-BNP 2639 on admission. CXR on admission revealed pulmonary edema that later resolved. Pt received home furosemide 20 mg BID and later IV lasix 20 mg daily for 3 days. Pt's volume status was closely monitored with discharge weight of 189 lb.   Acute Kidney Injury -  Pt with Cr above baseline of 1 that normalized after IVF administraiton. Etiology most likely pre-renal azotemia with Fe Urea 51.7 %. Nephrotoxins were held during hospitalization.   Hypovolemic Hyponatremia - Pt with hyponatremia during hospitalization that normalized with adequate hydration. Etiology likely due to hypovolemia in setting of diuretic therapy and decreased PO intake in setting of infection.    Anion Gap Metabolic Acidosis - Pt presented with AG 18 most likely due to severe sepsis that resolved with treament of infection during hospitalization.   Hyperlipidemia - Last lipid panel on 05/12/11 with cholesterol 206, TG 100, HDL 40, LDL 146. Pt at home on crestor 5 mg daily. Pt received atorvastatin 10 mg daily during hospitalization.    DVT Prophylaxis - Pt initially received SQ heparin later changed to aspirin 325 mg daily and SCD's. LE Doppler US on 5/17 revealed no evidence of DVT. Pt to continue aspirin 325 mg daily for four weeks after surgery.      Discharge Vitals:   BP 161/89  Pulse 96  Temp(Src) 98.8 F (37.1 C) (Oral)  Resp 20  Ht 6\' 3"  (1.905 m)  Wt  90.583 kg (199 lb 11.2 oz)  BMI 24.96 kg/m2  SpO2 93%  Discharge Labs:  No results found for this or any previous visit (from the past 24 hour(s)).  Signed: Juluis Mire, MD 05/25/2013, 2:24 AM   Time Spent on Discharge: 60 minutes Services Ordered on Discharge: Home health PT, RN Equipment Ordered on Discharge: Rolling walker with 5" wheels, 3 in 1, tub bench

## 2013-05-25 NOTE — Assessment & Plan Note (Signed)
Patient is s/p right 1st & 2nd ray amputation and subsequently is mostly weight bearing on his left foot (which he has had mid foot amputation).  Subsequently, he has developed an ulcer on the sole of his left foot - I have referred to wound clinic so this does not escalate.

## 2013-05-25 NOTE — Assessment & Plan Note (Signed)
Afebrile.  Managing IV vanc & unasyn at home, ID follow up next week.  Vanc trough drawn earlier this week.  Plan to end abx on 05/31/13.

## 2013-05-25 NOTE — Addendum Note (Signed)
Addended by: Truddie Crumble on: 05/25/2013 04:59 PM   Modules accepted: Orders

## 2013-05-25 NOTE — Assessment & Plan Note (Addendum)
BP Readings from Last 3 Encounters:  05/25/13 146/84  05/24/13 164/93  05/22/13 135/73    Lab Results  Component Value Date   NA 132* 05/22/2013   K 4.7 05/22/2013   CREATININE 1.06 05/22/2013    Assessment: Blood pressure control: moderately elevated Progress toward BP goal:  unchanged  Plan: Medications:  continue current medications - lasix 40 daily, coreg 3.125 bid, patient to start amlodipine 5mg  daily today (had not picked up since hospital discharge); apparently patient did not tolerate ARB due to cough

## 2013-05-25 NOTE — Assessment & Plan Note (Signed)
Hb trended up to 9.9 from 6 during hospitalization.  No s/s of anemia, so will defer Hb today - recheck at next follow up.  Discuss colonoscopy at next follow up once he has completed all of his acute visits for amputation/bacteremia.

## 2013-05-25 NOTE — Assessment & Plan Note (Signed)
Compliant with crestor 5mg  daily, check lipid panel today.

## 2013-05-25 NOTE — Patient Instructions (Addendum)
-   Regarding your furosemide (also called lasix) - you may take 40mg  daily instead of 20 mg twice daily - hopefully you can rest better with this regimen!!  -Regarding your diabetes, no changes today, but return in 3 months to recheck - take novolog 15 minutes before your meal  -Regarding your blood pressure, it is still a little high, but should be better with starting amlodipine, which you plan to pick up today  -Today I am checking your cholesterol level  -I have referred you to wound care for the ulcer on the bottom of your left foot  -Be sure to follow with Dr. Baxter Flattery and Dr. Sharol Given as scheduled  Please try to bring all your medicines next time. This helps [place] take good care of you and stops mistakes from medicines that could hurt you.  Should you have any new or worsening symptoms, please be sure to call the clinic at [phone].

## 2013-05-25 NOTE — Assessment & Plan Note (Addendum)
Grade 1 HFPEF, change lasix 20 BID to 40 daily so he is not constantly awakened to urinate; continue coreg, not on ACEI/ARB d/t cough with both.

## 2013-05-25 NOTE — Assessment & Plan Note (Addendum)
Lab Results  Component Value Date   HGBA1C 11.5* 05/12/2013   HGBA1C >14.0 02/15/2013   HGBA1C 13.8* 06/03/2012     Assessment: Diabetes control: poor control (HgbA1C >9%) Progress toward A1C goal:  unable to assess Comments: same regimen as pre hospitalization, but patient notes he was out of medication for a period of time prior to the 11.5 A1c  Plan: Medications:  continue current medications - lantus 70, novolog 27 TIDWC (advised he take insulin before meals), metformin BID Home glucose monitoring: Frequency:   Timing: before breakfast;at bedtime Instruction/counseling given: reminded to bring blood glucose meter & log to each visit and reminded to bring medications to each visit Other plans: Lipid panel today, urine microalb/cr I did not check renal function or vanc levels as he had labs drawn by [facility] and is being followed by ID

## 2013-05-25 NOTE — Assessment & Plan Note (Signed)
Has been on antireflux medication in the past- did well.  This is likely the etiology of his cough.  -Protonix 40 daily

## 2013-05-25 NOTE — Progress Notes (Signed)
Subjective:   Patient ID: [name] male   DOB: 30-Dec-1960 53 y.o.   MRN: [id number]  HPI: [name] is a 53 y.o. 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    He was hospitalized 05/10/13-05/21/13 for osteomyelitis of right great toe, s/p amputation   Since hospital discharge, has noticed a new small wound left food wound at base of sole (started during hospitalization during rehab).  No fevers/chills.  He is managing his IV abx - one is q6h the other is q8h.     ID appt on 05/30/13, and Dr. Sharol Given on 05/31/13.    Didn't bring meter- checks CBGs BID - before breakfast and dinner - CBGs in the morning in the 160s.  Highest CBG since being home 215, lowest 125 (felt hypoglycemic)  Blood drawn day before yesterday.    Review of Systems: Constitutional: Denies fever, chills, diaphoresis, appetite change and fatigue.  HEENT: Denies photophobia, eye pain, redness, hearing loss, ear pain, congestion, sore throat, rhinorrhea, sneezing, mouth sores, trouble swallowing, neck pain, neck stiffness and tinnitus.  Respiratory: Denies SOB, DOE, chest tightness, and wheezing. White sputum producing cough occasionally for months - would like to retry PPI Cardiovascular: Denies chest pain, palpitations and leg swelling.  Gastrointestinal: Denies nausea, vomiting, abdominal pain, diarrhea, constipation,blood in stool and abdominal distention.  Genitourinary: Denies dysuria, urgency, frequency, hematuria, flank pain and difficulty urinating.  Musculoskeletal: Denies myalgias, back pain, joint swelling, arthralgias and gait problem.  Neurological: Denies dizziness, seizures, syncope, weakness, lightheadedness, numbness and headaches.  Hematological: minimal bleeding from right foot wound     Past Medical History  Diagnosis Date  . Hyperlipidemia   . Hypertension   .  Osteomyelitis 2010    left foot, s/p midfoot amputation  . 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 Neuromuscular disorder     diabetic neruopathy  . PAD (peripheral artery disease)     ABIs 11/30/11: L ABI 0.68, R ABI 0.84  . Pneumonia 2010  . Critical lower limb ischemia, lt with ABI of 0.60 12/31/2011  . PVD (peripheral vascular disease) 12/31/2011  . S/P angioplasty with stent, 12/30/11, of Lt SFA, Post. tibialis and PTA of L. ant and post. tibial vessels 12/31/2011  . Type II diabetes mellitus ~ 2002  . GERD (gastroesophageal reflux disease)   . CHF (congestive heart failure)    Current Outpatient Prescriptions  Medication Sig Dispense Refill  . albuterol (PROVENTIL HFA;VENTOLIN HFA) 108 (90 BASE) MCG/ACT inhaler Inhale 1-2 puffs into the lungs every 6 (six) hours as needed for wheezing or shortness of breath.      [name] amLODipine (NORVASC) 5 MG tablet Take 1 tablet (5 mg total) by mouth daily.  30 tablet  0  . Ampicillin-Sulbactam 3 g in sodium chloride 0.9 % 100 mL Inject 3 g into the vein every 6 (six) hours.  168 g  0  . aspirin 325 MG EC tablet Take 325 mg by mouth daily.      . carvedilol (COREG) 3.125 MG tablet Take 3.125 mg by mouth 2 (two) times daily with a meal.      . furosemide (LASIX) 20 MG tablet Take 20 mg by mouth 2 (two) times daily.      [name] gabapentin (NEURONTIN) 300 MG capsule Take 600 mg by mouth 3 (three) times  daily.      . insulin aspart (NOVOLOG) 100 UNIT/ML injection Inject 27 Units into the skin 3 (three) times daily before meals.      . insulin glargine (LANTUS) 100 UNIT/ML injection Inject 70 Units into the skin at bedtime.      . metFORMIN (GLUCOPHAGE) 1000 MG tablet Take 1,000 mg by mouth 2 (two) times daily with a meal.      . rosuvastatin (CRESTOR) 5 MG tablet Take 5 mg by mouth at bedtime.      . traMADol (ULTRAM) 50 MG tablet Take 50 mg by mouth every 6 (six) hours as needed for moderate pain.      . Vancomycin  (VANCOCIN) 750 MG/150ML SOLN Inject 150 mLs (750 mg total) into the vein every 8 (eight) hours.  6300 mL  0   No current facility-administered medications for this visit.   Family History  Problem Relation Age of Onset  . Diabetes Mother   . Hypertension Brother   . Hypertension Sister   . Anesthesia problems Neg Hx    History   Social History  . Marital Status: Single    Spouse Name: N/A    Number of Children: N/A  . Years of Education: 11th   Occupational History  .  [employer]   Social History Main Topics  . Smoking status: Former Smoker -- 0.25 packs/day for 24 years    Types: Cigarettes    Quit date: 04/15/2005  . Smokeless tobacco: Never Used  . Alcohol Use: Yes     Comment: 06/01/2012 "~ once/month I have 2-3 mixed drinks"  . Drug Use: No  . Sexual Activity: Yes    Partners: Female    Birth Control/ Protection: Condom     Comment: one partner   Other Topics Concern  . 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, makes chair parts)   Graduated from [employer]; No further school because he had a baby girl   He has 4 children  (17, 81 , 54, 30 as of 2013)    Objective:  Physical Exam: Filed Vitals:   05/25/13 1408  BP: 146/84  Pulse: 102  Temp: 97.6 F (36.4 C)  TempSrc: Oral  Weight: 190 lb 4.8 oz (86.32 kg)  SpO2: 97%   General: pleasant, appears as stated age HEENT: PERRL, EOMI, no scleral icterus Cardiac: RRR, no rubs, murmurs or gallops Pulm: clear to auscultation bilaterally, moving normal volumes of air Abd: soft, nontender, nondistended, BS normoactive Ext: warm and well perfused, 1+ pretibial pitting edema, left stage 2 circular wound ~1cm at sole of left foot (s/p mid foot amputation), right foot wound with stiches on medial third of foot - no purulent drainage (only as 3rd & 4th toes remaining on right foot) Neuro: alert and oriented X3, cranial nerves II-XII grossly  intact  Assessment & Plan:  Case and care discussed with Dr. Eppie Gibson.  Please see problem oriented charting for further details. Patient to return in 3 months for DM and HTN check.

## 2013-05-26 NOTE — ED Provider Notes (Signed)
Medical screening examination/treatment/procedure(s) were performed by non-physician practitioner and as supervising physician I was immediately available for consultation/collaboration.   EKG Interpretation None        Fredia Sorrow, MD 05/26/13 [phone]

## 2013-05-29 ENCOUNTER — Other Ambulatory Visit: Payer: Self-pay | Admitting: *Deleted

## 2013-05-29 MED ORDER — TRAMADOL HCL 50 MG PO TABS: 50.0000 mg | ORAL_TABLET | Freq: Four times a day (QID) | ORAL | Status: DC | PRN

## 2013-05-29 NOTE — Discharge Summary (Signed)
Reviewed. Agree with documentation. 

## 2013-05-30 ENCOUNTER — Telehealth: Payer: Self-pay | Admitting: *Deleted

## 2013-05-30 ENCOUNTER — Encounter: Payer: Self-pay | Admitting: Internal Medicine

## 2013-05-30 ENCOUNTER — Ambulatory Visit (INDEPENDENT_AMBULATORY_CARE_PROVIDER_SITE_OTHER): Payer: No Typology Code available for payment source | Admitting: Internal Medicine

## 2013-05-30 VITALS — BP 157/80 | HR 98 | Temp 98.4°F | Ht 72.0 in | Wt 192.0 lb

## 2013-05-30 DIAGNOSIS — M9080 Osteopathy in diseases classified elsewhere, unspecified site: Secondary | ICD-10-CM

## 2013-05-30 DIAGNOSIS — E1169 Type 2 diabetes mellitus with other specified complication: Secondary | ICD-10-CM

## 2013-05-30 DIAGNOSIS — M869 Osteomyelitis, unspecified: Secondary | ICD-10-CM

## 2013-05-30 LAB — C-REACTIVE PROTEIN: CRP: 6 mg/dL — ABNORMAL HIGH (ref <0.60)

## 2013-05-30 LAB — CBC WITH DIFFERENTIAL/PLATELET
Basophils Absolute: 0 10*3/uL (ref 0.0–0.1)
Basophils Relative: 0 % (ref 0–1)
Eosinophils Absolute: 0.1 10*3/uL (ref 0.0–0.7)
Eosinophils Relative: 1 % (ref 0–5)
HEMATOCRIT: 27.5 % — AB (ref 39.0–52.0)
HEMOGLOBIN: 9.2 g/dL — AB (ref 13.0–17.0)
LYMPHS ABS: 2 10*3/uL (ref 0.7–4.0)
LYMPHS PCT: 26 % (ref 12–46)
MCH: 27.5 pg (ref 26.0–34.0)
MCHC: 33.5 g/dL (ref 30.0–36.0)
MCV: 82.3 fL (ref 78.0–100.0)
MONOS PCT: 6 % (ref 3–12)
Monocytes Absolute: 0.5 10*3/uL (ref 0.1–1.0)
Neutro Abs: 5 10*3/uL (ref 1.7–7.7)
Neutrophils Relative %: 67 % (ref 43–77)
Platelets: 336 10*3/uL (ref 150–400)
RBC: 3.34 MIL/uL — ABNORMAL LOW (ref 4.22–5.81)
RDW: 14.9 % (ref 11.5–15.5)
WBC: 7.5 10*3/uL (ref 4.0–10.5)

## 2013-05-30 LAB — BASIC METABOLIC PANEL
BUN: 21 mg/dL (ref 6–23)
CHLORIDE: 103 meq/L (ref 96–112)
CO2: 25 mEq/L (ref 19–32)
Calcium: 8.6 mg/dL (ref 8.4–10.5)
Creat: 0.85 mg/dL (ref 0.50–1.35)
Glucose, Bld: 94 mg/dL (ref 70–99)
Potassium: 4 mEq/L (ref 3.5–5.3)
SODIUM: 138 meq/L (ref 135–145)

## 2013-05-30 LAB — SEDIMENTATION RATE: Sed Rate: 111 mm/hr — ABNORMAL HIGH (ref 0–16)

## 2013-05-30 NOTE — Telephone Encounter (Signed)
Verbal order per Dr. Baxter Flattery given to [facility] at [place] to extend patient's IV antibiotics through 06/18/13. He has a follow up on that date and the stop date will be determined at that time. Myrtis Hopping

## 2013-05-30 NOTE — Progress Notes (Signed)
Subjective:    Patient ID: [name], male    DOB: 1960-03-22, 53 y.o.   MRN: [id number]  HPI 53yo M with DM, PVD, hx of diabetic foot ulcer, recently hospitalized for polymicrobial bacteremia (enterococcus, proteus, mrsa) with worsening diabetic foot ulcer/osteo s/p right foot 1st and 2nd ray amputation on 5/8 and debridement of plantar foot ulcer on left foot (MRSA + wound cx). He was discharged on 3 wk course of amp/sub plus vancomycin to end on June 1st. He had difficulty with picc line las week and went to ED for evaluation. He is tolerating antibiotics and denies having any recent rashes or diarrhea from antibiotics. He goes to wound care to dress his plantar ulcer on left foot but states that they leave his right foot dressing for orthopedics office to manage. He had recent dressign change at surgery office but did not place "wicking" dressing at distal aspect of surgical incision site. No fevers or chills  No Known Allergies Current Outpatient Prescriptions on File Prior to Visit  Medication Sig Dispense Refill  . amLODipine (NORVASC) 5 MG tablet Take 1 tablet (5 mg total) by mouth daily.  30 tablet  2  . aspirin 325 MG EC tablet Take 325 mg by mouth daily.      . carvedilol (COREG) 3.125 MG tablet Take 3.125 mg by mouth 2 (two) times daily with a meal.      . furosemide (LASIX) 40 MG tablet Take 1 tablet (40 mg total) by mouth daily.  30 tablet  3  . gabapentin (NEURONTIN) 300 MG capsule Take 600 mg by mouth 3 (three) times daily.      . insulin aspart (NOVOLOG) 100 UNIT/ML injection Inject 27 Units into the skin 3 (three) times daily before meals.      . insulin glargine (LANTUS) 100 UNIT/ML injection Inject 70 Units into the skin at bedtime.      . metFORMIN (GLUCOPHAGE) 1000 MG tablet Take 1,000 mg by mouth 2 (two) times daily with a meal.      . pantoprazole (PROTONIX) 40 MG tablet Take 1 tablet (40 mg total) by mouth daily.  30 tablet  1  . rosuvastatin (CRESTOR) 5 MG tablet Take  5 mg by mouth at bedtime.      [name] albuterol (PROVENTIL HFA;VENTOLIN HFA) 108 (90 BASE) MCG/ACT inhaler Inhale 1-2 puffs into the lungs every 6 (six) hours as needed for wheezing or shortness of breath.      . traMADol (ULTRAM) 50 MG tablet Take 1 tablet (50 mg total) by mouth every 6 (six) hours as needed for moderate pain.  30 tablet  0   No current facility-administered medications on file prior to visit.   Active Ambulatory Problems    Diagnosis Date Noted  . Diabetes mellitus type 2, uncontrolled, with complications [id number]  . HYPERLIPIDEMIA 03/06/2008  . HYPERTENSION 03/06/2008  . Sexual dysfunction 04/30/2010  . Diabetic foot ulcer, bilateral 10/05/2011  . Critical lower limb ischemia, lt with ABI of 0.60 12/31/2011  . PVD (peripheral vascular disease) 12/31/2011  . S/P angioplasty with stent, 12/30/11, of Lt SFA, Post. tibialis and PTA of L. ant and post. tibial vessels 12/31/2011  . Type 2 diabetes mellitus with right diabetic foot ulcer 06/01/2012  . Chronic ulcer of left foot with fat layer exposed 06/29/2012  . Healthcare maintenance 02/15/2013  . Heart failure with preserved ejection fraction 03/27/2013  . Hyponatremia 05/10/2013  . Unspecified deficiency anemia 05/17/2013  . GERD (gastroesophageal reflux disease)  05/25/2013  . Proteus Mirabilis, MRSA, & Enterococcus Bacteremia 05/25/2013  . Normocytic anemia 05/31/2013   Resolved Ambulatory Problems    Diagnosis Date Noted  . BLURRED VISION 07/12/2008  . Cough 10/05/2011  . Hyponatremia 01/06/2012  . Ulcer of right great toe due to diabetes mellitus 06/01/2012  . Cough 06/01/2012  . Foot osteomyelitis, left 06/29/2012  . Acute CHF 03/27/2013  . Sepsis 05/11/2013  . Osteomyelitis of right foot 05/11/2013  . Acute respiratory failure 05/11/2013  . Hyperglycemia 05/11/2013   Past Medical History  Diagnosis Date  . Hyperlipidemia   . Hypertension   . Osteomyelitis 2010  . Osteomyelitis of ankle or foot 05/2011    . Neuromuscular disorder   . PAD (peripheral artery disease)   . Pneumonia 2010  . Type II diabetes mellitus ~ 2002  . CHF (congestive heart failure)      Review of Systems 10 point ros is negative, poorly healing wound per hpi    Objective:   Physical Exam BP 157/80  Pulse 98  Temp(Src) 98.4 F (36.9 C) (Oral)  Ht 6' (1.829 m)  Wt 192 lb (87.091 kg)  BMI 26.03 kg/m2 gen = a xo by 3 in NAD Skin: right foot has incision site that has 13 cm long, macerated at distal aspect. Width 1.3cm where he "popped" a few suture. Left plantar ulcer <1cm but has exfoliation surrounding it.   Labs: Lab Results  Component Value Date   WBC 7.5 05/30/2013   HGB 9.2* 05/30/2013   HCT 27.5* 05/30/2013   MCV 82.3 05/30/2013   PLT 336 05/30/2013   Lab Results  Component Value Date   ESRSEDRATE 111* 05/30/2013   BMET    Component Value Date/Time   NA 138 05/30/2013 1100   K 4.0 05/30/2013 1100   CL 103 05/30/2013 1100   CO2 25 05/30/2013 1100   GLUCOSE 94 05/30/2013 1100   BUN 21 05/30/2013 1100   CREATININE 0.85 05/30/2013 1100   CREATININE 1.06 05/22/2013 1957   CALCIUM 8.6 05/30/2013 1100   GFRNONAA 78* 05/22/2013 1957   GFRNONAA 70 04/11/2013 0825   GFRAA >90 05/22/2013 1957   GFRAA 81 04/11/2013 0825         Assessment & Plan:  Diabetic foot wound/osteomyelitis- we have checked the following labs at this visit: cbc with diff, sed rate, crp, bmp labs today. Due to [facility] still being quite elevated, we will continue IV antibiotics for a total of 6 wks (3 addn weeks) and likely will need oral suppressive therapy.   Left plantar ulcer = spent 5 minutes doing local debridement  We have called advance to  extend antibiotics 2-3 wks.   rtc in 3 wk to assess need for oral regimen

## 2013-05-30 NOTE — Telephone Encounter (Signed)
Called to pharm 

## 2013-05-31 DIAGNOSIS — D649 Anemia, unspecified: Secondary | ICD-10-CM | POA: Insufficient documentation

## 2013-05-31 NOTE — Assessment & Plan Note (Signed)
Uptrending at hospital discharge, patient asymptomatic at [facility].  He is due for screening colonoscopy, but would like to wait until he has completed IV abx and acute hospital issues (discussed with patient on 05/31/13 at 9:55a).  Plan for GI referral at next follow up.  Also, to note, he did have trace Hb in his urine, with 0-2 RBCs.  CK was not checked.  Repeat UA once acute issues resolved.

## 2013-06-02 NOTE — Progress Notes (Signed)
Case discussed with Dr. Sharda soon after the resident saw the patient.  We reviewed the resident's history and exam and pertinent patient test results.  I agree with the assessment, diagnosis, and plan of care documented in the resident's note. 

## 2013-06-06 ENCOUNTER — Encounter (HOSPITAL_BASED_OUTPATIENT_CLINIC_OR_DEPARTMENT_OTHER): Payer: No Typology Code available for payment source | Attending: General Surgery

## 2013-06-06 DIAGNOSIS — L97509 Non-pressure chronic ulcer of other part of unspecified foot with unspecified severity: Secondary | ICD-10-CM | POA: Insufficient documentation

## 2013-06-06 DIAGNOSIS — E1169 Type 2 diabetes mellitus with other specified complication: Secondary | ICD-10-CM | POA: Insufficient documentation

## 2013-06-15 ENCOUNTER — Telehealth: Payer: Self-pay | Admitting: *Deleted

## 2013-06-15 MED ORDER — ESOMEPRAZOLE MAGNESIUM 20 MG PO CPDR: 20.0000 mg | DELAYED_RELEASE_CAPSULE | Freq: Every day | ORAL | Status: DC

## 2013-06-15 NOTE — Telephone Encounter (Signed)
Fax from [facility] is no longer available for free- would u consider changing to Nexium? If so, please fax a new rx   Thanks

## 2013-06-15 NOTE — Telephone Encounter (Signed)
Discontinued protonix d/t formulary change - New Rx for nexium sent by fax. Thanks

## 2013-06-18 ENCOUNTER — Ambulatory Visit (INDEPENDENT_AMBULATORY_CARE_PROVIDER_SITE_OTHER): Payer: Medicaid Other | Admitting: Infectious Diseases

## 2013-06-18 ENCOUNTER — Telehealth: Payer: Self-pay | Admitting: *Deleted

## 2013-06-18 ENCOUNTER — Encounter: Payer: Self-pay | Admitting: Infectious Diseases

## 2013-06-18 VITALS — BP 150/78 | HR 112 | Temp 98.1°F | Wt 193.0 lb

## 2013-06-18 DIAGNOSIS — L97519 Non-pressure chronic ulcer of other part of right foot with unspecified severity: Principal | ICD-10-CM

## 2013-06-18 DIAGNOSIS — I10 Essential (primary) hypertension: Secondary | ICD-10-CM

## 2013-06-18 DIAGNOSIS — E11621 Type 2 diabetes mellitus with foot ulcer: Secondary | ICD-10-CM

## 2013-06-18 DIAGNOSIS — R7881 Bacteremia: Secondary | ICD-10-CM

## 2013-06-18 DIAGNOSIS — L97509 Non-pressure chronic ulcer of other part of unspecified foot with unspecified severity: Secondary | ICD-10-CM

## 2013-06-18 DIAGNOSIS — E1169 Type 2 diabetes mellitus with other specified complication: Secondary | ICD-10-CM

## 2013-06-18 NOTE — Telephone Encounter (Signed)
Verbal order per Dr. Johnnye Sima given to Jackelyn Poling at [facility] for Blood Cultures on patient at next lab draw. Myrtis Hopping

## 2013-06-18 NOTE — Assessment & Plan Note (Signed)
Will repeat his BCx via home health.

## 2013-06-18 NOTE — Progress Notes (Signed)
   Subjective:    Patient ID: [name], male    DOB: 10-03-1960, 53 y.o.   MRN: [id number]  HPI 53yo M with DM, PVD, hx of diabetic foot ulcer, hospitalized for polymicrobial bacteremia (enterococcus, proteus, mrsa) with worsening diabetic foot ulcer/osteo s/p right foot 1st and 2nd ray amputation on 5/8 and debridement of plantar foot ulcer on left foot (MRSA + wound cx). He was discharged on 3 wk course of amp/sub plus vancomycin to end on June 1st. He was seen in ID clinic on 5-27 and had his anbx extended 3 additional weeks due to elevated ESR (111) and CRP (6).  Feels like his wound is healing well.  FSG 106-176  Review of Systems  Constitutional: Negative for fever, chills and appetite change.  Gastrointestinal: Negative for diarrhea and constipation.  Genitourinary: Negative for difficulty urinating.      Objective:   Physical Exam  Constitutional: He appears well-developed and well-nourished.  Eyes: EOM are normal. Pupils are equal, round, and reactive to light.  Cardiovascular: Normal rate, regular rhythm and normal heart sounds.   Pulmonary/Chest: Effort normal and breath sounds normal. No respiratory distress.  Abdominal: Soft. Bowel sounds are normal. He exhibits no distension. There is no tenderness.  Musculoskeletal:       Arms:      Feet:          Assessment & Plan:

## 2013-06-18 NOTE — Assessment & Plan Note (Signed)
Will get repeat ESR and CRP to see how much longer he will get IV anbx (currently has < 1 week). If these markers have improved, will change him to po bactrim. Home health will re-dress his PIC and get labs today. Will see him back in 1 month.

## 2013-06-18 NOTE — Assessment & Plan Note (Signed)
Greatly appreciate IM f/u.

## 2013-06-22 ENCOUNTER — Telehealth: Payer: Self-pay | Admitting: *Deleted

## 2013-06-22 NOTE — Telephone Encounter (Signed)
Message copied by Georgena Spurling on Fri Jun 22, 2013 11:09 AM ------      Message from: HATCHER, JEFFREY C      Created: Fri Jun 22, 2013 10:51 AM       Please extend pts IV anbx for 1 month.       F/u in clinic in 1 month.  ------

## 2013-06-22 NOTE — Telephone Encounter (Signed)
Verbal order per Dr. Johnnye Sima given to [facility] at [place] to extend IV antibiotics for 1 month. Patient has follow up appointment with Dr. Baxter Flattery on 07/19/13. [name]

## 2013-07-09 ENCOUNTER — Telehealth: Payer: Self-pay | Admitting: Licensed Clinical Social Worker

## 2013-07-09 NOTE — Telephone Encounter (Signed)
Amy pharmacist at [facility] states the patient is currently on Vanc and Unicen. Patty Sermons  is on national back order and will not be able to refill anymore after this Friday. Patient is scheduled to stop antibiotics on 07/21/13. Please advise

## 2013-07-11 NOTE — Telephone Encounter (Signed)
See if it can be changed to invanz 1 gram daily IV until then

## 2013-07-19 ENCOUNTER — Encounter: Payer: Self-pay | Admitting: Internal Medicine

## 2013-07-19 ENCOUNTER — Ambulatory Visit (INDEPENDENT_AMBULATORY_CARE_PROVIDER_SITE_OTHER): Payer: Medicaid Other | Admitting: Internal Medicine

## 2013-07-19 ENCOUNTER — Telehealth: Payer: Self-pay | Admitting: *Deleted

## 2013-07-19 VITALS — BP 144/82 | HR 101 | Temp 98.2°F | Ht 72.0 in | Wt 197.0 lb

## 2013-07-19 DIAGNOSIS — L97509 Non-pressure chronic ulcer of other part of unspecified foot with unspecified severity: Secondary | ICD-10-CM

## 2013-07-19 DIAGNOSIS — M9080 Osteopathy in diseases classified elsewhere, unspecified site: Secondary | ICD-10-CM

## 2013-07-19 DIAGNOSIS — E1169 Type 2 diabetes mellitus with other specified complication: Secondary | ICD-10-CM

## 2013-07-19 DIAGNOSIS — E11621 Type 2 diabetes mellitus with foot ulcer: Secondary | ICD-10-CM

## 2013-07-19 DIAGNOSIS — M869 Osteomyelitis, unspecified: Secondary | ICD-10-CM

## 2013-07-19 NOTE — Progress Notes (Signed)
Subjective:    Patient ID: [name], male    DOB: 1960/07/05, 53 y.o.   MRN: [id number]  HPI 53yo M with DM, PVD, hx of diabetic foot ulcer, recently hospitalized for polymicrobial bacteremia (enterococcus, proteus, mrsa) with worsening diabetic foot ulcer/osteo s/p right foot 1st and 2nd ray amputation on 5/8 and debridement of plantar foot ulcer on left foot (MRSA + wound cx). He was discharged on 6 wk, with vanco and amp/sub. Saw Dr. Johnnye Sima mid June who extended antibiotics for addn 4 wk. On July 8th, antibiotics changed to vanco and ertapenem due to [employer] on amp/sub. He is now here for follow up after 10 wks of IV antibiotics. He also sees Dr. Sharol Given in follow up for his 1st/2nd ray amputation. The wound is still open but has had build up of callus on plantar aspect of 3rd and 4th toe. He is wondering if he could go back to wound care with Dr. Jerline Pain as well. He reports accidentally taking benicar for 5 days last week which could attribute to his cr being elevated on most recent labs  7/14 sed rate 79, crp 4.2, cr 1.52  Current Outpatient Prescriptions on File Prior to Visit  Medication Sig Dispense Refill  . albuterol (PROVENTIL HFA;VENTOLIN HFA) 108 (90 BASE) MCG/ACT inhaler Inhale 1-2 puffs into the lungs every 6 (six) hours as needed for wheezing or shortness of breath.      [name] amLODipine (NORVASC) 5 MG tablet Take 1 tablet (5 mg total) by mouth daily.  30 tablet  2  . aspirin 325 MG EC tablet Take 325 mg by mouth daily.      . carvedilol (COREG) 3.125 MG tablet Take 3.125 mg by mouth 2 (two) times daily with a meal.      . furosemide (LASIX) 40 MG tablet Take 1 tablet (40 mg total) by mouth daily.  30 tablet  3  . gabapentin (NEURONTIN) 300 MG capsule Take 600 mg by mouth 3 (three) times daily.      . insulin aspart (NOVOLOG) 100 UNIT/ML injection Inject 27 Units into the skin 3 (three) times daily before meals.      . insulin glargine (LANTUS) 100 UNIT/ML injection Inject 70  Units into the skin at bedtime.      . metFORMIN (GLUCOPHAGE) 1000 MG tablet Take 1,000 mg by mouth 2 (two) times daily with a meal.      . rosuvastatin (CRESTOR) 5 MG tablet Take 5 mg by mouth at bedtime.      . traMADol (ULTRAM) 50 MG tablet Take 1 tablet (50 mg total) by mouth every 6 (six) hours as needed for moderate pain.  30 tablet  0  . esomeprazole (NEXIUM) 20 MG capsule Take 1 capsule (20 mg total) by mouth daily.  30 capsule  2   No current facility-administered medications on file prior to visit.   Active Ambulatory Problems    Diagnosis Date Noted  . Diabetes mellitus type 2, uncontrolled, with complications [id number]  . HYPERLIPIDEMIA 03/06/2008  . HYPERTENSION 03/06/2008  . Sexual dysfunction 04/30/2010  . Diabetic foot ulcer, bilateral 10/05/2011  . Critical lower limb ischemia, lt with ABI of 0.60 12/31/2011  . PVD (peripheral vascular disease) 12/31/2011  . S/P angioplasty with stent, 12/30/11, of Lt SFA, Post. tibialis and PTA of L. ant and post. tibial vessels 12/31/2011  . Type 2 diabetes mellitus with right diabetic foot ulcer 06/01/2012  . Chronic ulcer of left foot with fat layer exposed  06/29/2012  . Healthcare maintenance 02/15/2013  . Heart failure with preserved ejection fraction 03/27/2013  . Hyponatremia 05/10/2013  . Unspecified deficiency anemia 05/17/2013  . GERD (gastroesophageal reflux disease) 05/25/2013  . Proteus Mirabilis, MRSA, & Enterococcus Bacteremia 05/25/2013  . Normocytic anemia 05/31/2013   Resolved Ambulatory Problems    Diagnosis Date Noted  . BLURRED VISION 07/12/2008  . Cough 10/05/2011  . Hyponatremia 01/06/2012  . Ulcer of right great toe due to diabetes mellitus 06/01/2012  . Cough 06/01/2012  . Foot osteomyelitis, left 06/29/2012  . Acute CHF 03/27/2013  . Sepsis 05/11/2013  . Osteomyelitis of right foot 05/11/2013  . Acute respiratory failure 05/11/2013  . Hyperglycemia 05/11/2013   Past Medical History  Diagnosis  Date  . Hyperlipidemia   . Hypertension   . Osteomyelitis 2010  . Osteomyelitis of ankle or foot 05/2011  . Neuromuscular disorder   . PAD (peripheral artery disease)   . Pneumonia 2010  . Type II diabetes mellitus ~ 2002  . CHF (congestive heart failure)         Review of Systems     Objective:   Physical Exam BP 144/82  Pulse 101  Temp(Src) 98.2 F (36.8 C) (Oral)  Ht 6' (1.829 m)  Wt 197 lb (89.359 kg)  BMI 26.71 kg/m2 Physical Exam  Constitutional: He is oriented to person, place, and time. He appears well-developed and well-nourished. No distress.  HENT:  Mouth/Throat: Oropharynx is clear and moist. No oropharyngeal exudate.  Ext: right arm picc line c/d/i Skin: right foot 1st and 2nd ray amputation. Still has residual open wound from surgical incision site. Approximately 1.5cm.  Psychiatric: He has a normal mood and affect. His behavior is normal.   Labs: MRSA isolate R bactrim R tetra R clindart    Assessment & Plan:   Diabetic foot ulcer = will refer to [facility] and return back into care with dr. Jerline Pain, appt next week  1st/2nd ray amputation wound = currently on broad spectrum of vancomycin and ertapenem. Has been on iv antibiotics for 10 wk. Labs still markedly elevated. Will continue antibiotics for addn 2-3 wk. To see dr. Sharol Given this afternoon for furhter management of slow surgical wound healing. Will need to repeat imaging of foot to see if deep tissue infection is still part of his clinical picture. It is unusual that he should still have such elevated inflammatory markers after 10 wk of therapy.  rtc in 2-3 wk.

## 2013-07-19 NOTE — Telephone Encounter (Signed)
Spoke with Amy at [facility], gave verbal order to continue IV antibiotics and PICC care for 2 more weeks.  Patient is to see Dr. Sharol Given this afternoon (previously scheduled).  Patient scheduled for wound care follow up on 7/24 at 9:15.  Will get MRI PA, needs to have completed before follow up with Dr. Baxter Flattery.

## 2013-07-23 ENCOUNTER — Telehealth: Payer: Self-pay | Admitting: *Deleted

## 2013-07-23 NOTE — Telephone Encounter (Signed)
Left message for patient informing him of the MRI appointment at [facility].  He is scheduled for 7/27 at 7:00 pm, arrival at 5:45 for BMP prior to scan.  Pt should go to the [facility] at [address] for the scan. No dietary restrictions for this procedure.  Asked patient to call back confirming this appointment. Landis Gandy, RN

## 2013-07-24 ENCOUNTER — Other Ambulatory Visit: Payer: Self-pay | Admitting: Internal Medicine

## 2013-07-24 ENCOUNTER — Ambulatory Visit (HOSPITAL_COMMUNITY)
Admission: RE | Admit: 2013-07-24 | Discharge: 2013-07-24 | Disposition: A | Discharge status: Home / Self Care | Payer: Medicaid Other | Source: Ambulatory Visit | Attending: Internal Medicine | Admitting: Internal Medicine

## 2013-07-24 ENCOUNTER — Encounter: Payer: Self-pay | Admitting: Internal Medicine

## 2013-07-24 ENCOUNTER — Ambulatory Visit (INDEPENDENT_AMBULATORY_CARE_PROVIDER_SITE_OTHER): Payer: Medicaid Other | Admitting: Internal Medicine

## 2013-07-24 VITALS — BP 152/83 | HR 100 | Temp 98.2°F | Ht 72.0 in | Wt 196.6 lb

## 2013-07-24 DIAGNOSIS — R7881 Bacteremia: Secondary | ICD-10-CM

## 2013-07-24 DIAGNOSIS — B964 Proteus (mirabilis) (morganii) as the cause of diseases classified elsewhere: Secondary | ICD-10-CM

## 2013-07-24 DIAGNOSIS — Q828 Other specified congenital malformations of skin: Secondary | ICD-10-CM

## 2013-07-24 DIAGNOSIS — A4902 Methicillin resistant Staphylococcus aureus infection, unspecified site: Secondary | ICD-10-CM

## 2013-07-24 DIAGNOSIS — B952 Enterococcus as the cause of diseases classified elsewhere: Secondary | ICD-10-CM

## 2013-07-24 DIAGNOSIS — M869 Osteomyelitis, unspecified: Secondary | ICD-10-CM | POA: Diagnosis present

## 2013-07-24 LAB — CBC
HCT: 33 % — ABNORMAL LOW (ref 39.0–52.0)
HEMOGLOBIN: 10.7 g/dL — AB (ref 13.0–17.0)
MCH: 26.7 pg (ref 26.0–34.0)
MCHC: 32.4 g/dL (ref 30.0–36.0)
MCV: 82.3 fL (ref 78.0–100.0)
Platelets: 314 10*3/uL (ref 150–400)
RBC: 4.01 MIL/uL — AB (ref 4.22–5.81)
RDW: 15.9 % — ABNORMAL HIGH (ref 11.5–15.5)
WBC: 8.2 10*3/uL (ref 4.0–10.5)

## 2013-07-24 NOTE — Patient Instructions (Signed)
Please go to radiology for PICC placement and continue antibiotic treatment upon returning home.  Thank you!

## 2013-07-24 NOTE — Progress Notes (Signed)
   Subjective:    Patient ID: [name], male    DOB: [birth year], 53 y.o.   MRN: [id number]  HPI [name] is a 53 year old male with DM2, PVD and a history of foot ulcers who is coming today to have his PICC line evaluated.  Yesterday, the home care RN came in to change the dressing. She noted a rash, green discharge, and felt it had been displaced 11-cm. She also noticed a rash and told him to go to the ED to be evaluated. He came to the ED and left after seeing the number of people there. He came to the office this morning. Of note, his dressing normally gets changed weekly but was not changed last week thus marking two weeks without having his dressing changed.   On 5/7-5/18, he was hospitalized for polymicrobial bacteremia (enterococcus, proteus, MRSA) with worsening diabetic foot ulcer/osteomyelitis s/p right foot 1st and 2nd ray amputation (5/8) and debridement of plantar foot ulcer on left foot (MRSA + wound cx). He was discharged on vancomycin and ampicillin/sulbactam x 6 weeks. His treatment was further extended for 4 weeks by Dr. Johnnye Sima in mid-June. On 7/8, ampicillin/sulbactam was changed to ertapenem due to a [employer]. On 7/16, Dr. Baxter Flattery decided to continue treatment for another 2-3 weeks given that he had abnormal labs (ESR 79, CRP 4.2, Cr 1.52).  Today, he denies pain in upper right arm, fevers, fatigue, malaise. He believes the greenish discharge may be due to skin moisture and possibly his own deodorant leaking into the dressing. He last received his antibiotics yesterday at 2pm.  Review of Systems  Constitutional: Negative for fever and fatigue.  Musculoskeletal: Negative for myalgias.  Skin: Positive for rash and wound.  All other systems reviewed and are negative.      Objective:   Physical Exam  Constitutional: He is oriented to person, place, and time. He appears well-developed. No distress.  HENT:  Head: Normocephalic and atraumatic.  Eyes: Conjunctivae  are normal. Pupils are equal, round, and reactive to light.  Neurological: He is alert and oriented to person, place, and time. No cranial nerve deficit.  Skin: He is not diaphoretic.  PICC line in medial aspect of upper right arm. Surrounded by red lesions, not warm to touch.  Psychiatric: He has a normal mood and affect. His behavior is normal.          Assessment & Plan:

## 2013-07-24 NOTE — Assessment & Plan Note (Addendum)
Assessment -IV team RN assessed that the catheter had been pulled 28-cm from when it was originally placed and that it will need to be taken out and replaced today. -Insertion site is questionable for possible infection but less likely given absence of fevers.  Plan -Order placement of new PICC for today so that he can continue abx -Check CBC and blood culture to r/o infection -Culture catheter tip to r/o infection  ADDENDUM 07/25/2013 7:55 AM:  -WBC 8.2,  unremarkable for infection  -Cultures pending

## 2013-07-24 NOTE — Progress Notes (Signed)
Assessed right single lumen PICC. PICC out to 28 cm mark. Per MD order removed right single lumen PICC and tip of PICC line was cultured. Patient tolerated procedure well. NO bleeding noted at PICC insertion site. Tasia Catchings RN [facility], IV Team.

## 2013-07-25 NOTE — Progress Notes (Signed)
INTERNAL MEDICINE TEACHING ATTENDING ADDENDUM - [name] Contes, MD: I personally saw and evaluated [name] in this clinic visit in conjunction with the resident, Dr. Posey [name]. I have discussed patient's plan of care with medical resident during this visit. I have confirmed the physical exam findings and have read and agree with the clinic note including the plan with the following addition: - Patient seen in clinic for displaced PICC line - Of note visiting RN also noted greenish discharge at PICC site - On exam- Pt afebrile, Lungs- CTA b/l, Cardio- RRR, normal heart sounds, PICC line displaced with some greenish discharge at site - PICC line dc'd and tip sent for culture. Check blood cx - Patient for PICC replacement today with IR - Continue with abx at home per ID

## 2013-07-27 ENCOUNTER — Encounter (HOSPITAL_BASED_OUTPATIENT_CLINIC_OR_DEPARTMENT_OTHER): Payer: Medicaid Other | Attending: General Surgery

## 2013-07-27 DIAGNOSIS — I96 Gangrene, not elsewhere classified: Secondary | ICD-10-CM | POA: Insufficient documentation

## 2013-07-27 DIAGNOSIS — Z794 Long term (current) use of insulin: Secondary | ICD-10-CM | POA: Diagnosis not present

## 2013-07-27 DIAGNOSIS — E1159 Type 2 diabetes mellitus with other circulatory complications: Secondary | ICD-10-CM | POA: Diagnosis present

## 2013-07-27 DIAGNOSIS — S98119A Complete traumatic amputation of unspecified great toe, initial encounter: Secondary | ICD-10-CM | POA: Insufficient documentation

## 2013-07-27 DIAGNOSIS — Z7982 Long term (current) use of aspirin: Secondary | ICD-10-CM | POA: Diagnosis not present

## 2013-07-27 DIAGNOSIS — Z79899 Other long term (current) drug therapy: Secondary | ICD-10-CM | POA: Insufficient documentation

## 2013-07-27 DIAGNOSIS — S98139A Complete traumatic amputation of one unspecified lesser toe, initial encounter: Secondary | ICD-10-CM | POA: Diagnosis not present

## 2013-07-27 LAB — CATH TIP CULTURE: Organism ID, Bacteria: >100

## 2013-07-27 NOTE — Progress Notes (Signed)
[facility]  NAME:  [name], [name] NO.:  [id number]  MEDICAL RECORD NO.:  [id number]      DATE OF BIRTH:  03-Jul-1960  PHYSICIAN:  Judene Companion, M.D.      VISIT DATE:  07/27/2013                                  OFFICE VISIT   [name] is a 53 year old, African American male, who I have known for years.  He has type 2 diabetes and has had great problems with lower limb ischemia.  He has had an angioplasty on his posterior tibial artery.  He has had a chronic ulcer of his left foot.  He has had osteomyelitis of the right foot.  His medicines are numerous that include Norvasc, aspirin, carvedilol, Lasix, Neurontin, NovoLog insulin, Lantus insulin, metformin.  This gentleman was operated by Dr. Sharol Given about 6 weeks ago, where he had first and second toe and ray amputations of the right foot.  This really has healed poorly.  Today, all of the skin over the wound was gangrenous and I trimmed the gangrenous skin away which took [place] right down to the deep subcu and peritendinous material.  I am going to treat this with Santyl for now daily with daily washings with soap and water.  We are going to have to work on this with Santyl for a while.  I really feel like this is going to end up in a transmetatarsal amputation as I just do not think he has blood supply enough at this time to heal this wound.  So for now, we will treat it with Santyl and I will have him go back to Dr. Sharol Given probably soon to have him evaluate for a post transmetatarsal amputation.     Judene Companion, M.D.     PP/MEDQ  D:  07/27/2013  T:  07/27/2013  Job:  [id number]

## 2013-07-30 ENCOUNTER — Ambulatory Visit (HOSPITAL_COMMUNITY): Admission: RE | Admit: 2013-07-30 | Payer: Medicaid Other | Source: Ambulatory Visit

## 2013-07-30 ENCOUNTER — Telehealth: Payer: Self-pay | Admitting: *Deleted

## 2013-07-30 ENCOUNTER — Ambulatory Visit
Admission: RE | Admit: 2013-07-30 | Discharge: 2013-07-30 | Disposition: A | Discharge status: Home / Self Care | Payer: Medicaid Other | Source: Ambulatory Visit | Attending: Internal Medicine | Admitting: Internal Medicine

## 2013-07-30 DIAGNOSIS — M869 Osteomyelitis, unspecified: Secondary | ICD-10-CM

## 2013-07-30 LAB — CULTURE, BLOOD (SINGLE): ORGANISM ID, BACTERIA: NO GROWTH

## 2013-07-30 NOTE — Telephone Encounter (Signed)
Patient's MRI PA denied over weekend, they require a current xray first.  Orders written, patient sent to [facility] for this procedure.  Pt advised to have this done as soon as he can.  Will reschedule MRI. Landis Gandy, RN

## 2013-07-31 ENCOUNTER — Telehealth: Payer: Self-pay | Admitting: *Deleted

## 2013-07-31 NOTE — Telephone Encounter (Signed)
Received authorization for MRI [id number]).  Patient will have this completed tomorrow 7/29 at [facility] at 7:00 am, (6:45 arrival).  Pt will need labwork drawn prior to MRI.  Patient accepted appointment, knows where to go for the procedure. Landis Gandy, RN

## 2013-08-01 ENCOUNTER — Ambulatory Visit (HOSPITAL_COMMUNITY)
Admission: RE | Admit: 2013-08-01 | Discharge: 2013-08-01 | Disposition: A | Discharge status: Home / Self Care | Payer: Medicaid Other | Source: Ambulatory Visit | Attending: Internal Medicine | Admitting: Internal Medicine

## 2013-08-01 DIAGNOSIS — E1159 Type 2 diabetes mellitus with other circulatory complications: Secondary | ICD-10-CM | POA: Diagnosis not present

## 2013-08-01 DIAGNOSIS — E11621 Type 2 diabetes mellitus with foot ulcer: Secondary | ICD-10-CM

## 2013-08-01 DIAGNOSIS — E1169 Type 2 diabetes mellitus with other specified complication: Secondary | ICD-10-CM

## 2013-08-01 DIAGNOSIS — S91309A Unspecified open wound, unspecified foot, initial encounter: Secondary | ICD-10-CM | POA: Insufficient documentation

## 2013-08-01 DIAGNOSIS — S98139A Complete traumatic amputation of one unspecified lesser toe, initial encounter: Secondary | ICD-10-CM | POA: Insufficient documentation

## 2013-08-01 DIAGNOSIS — M79609 Pain in unspecified limb: Secondary | ICD-10-CM | POA: Diagnosis present

## 2013-08-01 DIAGNOSIS — X58XXXA Exposure to other specified factors, initial encounter: Secondary | ICD-10-CM | POA: Diagnosis not present

## 2013-08-01 DIAGNOSIS — M869 Osteomyelitis, unspecified: Secondary | ICD-10-CM | POA: Insufficient documentation

## 2013-08-01 DIAGNOSIS — IMO0001 Reserved for inherently not codable concepts without codable children: Secondary | ICD-10-CM | POA: Diagnosis not present

## 2013-08-01 DIAGNOSIS — Z794 Long term (current) use of insulin: Secondary | ICD-10-CM | POA: Diagnosis not present

## 2013-08-01 DIAGNOSIS — L97509 Non-pressure chronic ulcer of other part of unspecified foot with unspecified severity: Secondary | ICD-10-CM | POA: Diagnosis not present

## 2013-08-01 DIAGNOSIS — M7989 Other specified soft tissue disorders: Secondary | ICD-10-CM | POA: Diagnosis not present

## 2013-08-01 DIAGNOSIS — S98119A Complete traumatic amputation of unspecified great toe, initial encounter: Secondary | ICD-10-CM | POA: Diagnosis not present

## 2013-08-01 DIAGNOSIS — I96 Gangrene, not elsewhere classified: Secondary | ICD-10-CM | POA: Diagnosis not present

## 2013-08-01 LAB — POCT I-STAT CREATININE: CREATININE: 1.3 mg/dL (ref 0.50–1.35)

## 2013-08-01 LAB — GLUCOSE, CAPILLARY: GLUCOSE-CAPILLARY: 133 mg/dL — AB (ref 70–99)

## 2013-08-01 MED ORDER — GADOBENATE DIMEGLUMINE 529 MG/ML IV SOLN
20.0000 mL | Freq: Once | INTRAVENOUS | Status: AC | PRN
  Administered 2013-08-01: 18 mL via INTRAVENOUS

## 2013-08-02 ENCOUNTER — Encounter: Payer: Self-pay | Admitting: Internal Medicine

## 2013-08-02 ENCOUNTER — Other Ambulatory Visit (HOSPITAL_COMMUNITY): Payer: Self-pay | Admitting: Orthopedic Surgery

## 2013-08-02 ENCOUNTER — Encounter (HOSPITAL_COMMUNITY): Payer: Self-pay | Admitting: *Deleted

## 2013-08-02 ENCOUNTER — Ambulatory Visit (INDEPENDENT_AMBULATORY_CARE_PROVIDER_SITE_OTHER): Payer: Medicaid Other | Admitting: Internal Medicine

## 2013-08-02 VITALS — BP 160/83 | HR 99 | Temp 97.6°F | Wt 196.0 lb

## 2013-08-02 DIAGNOSIS — E1169 Type 2 diabetes mellitus with other specified complication: Secondary | ICD-10-CM

## 2013-08-02 DIAGNOSIS — E11621 Type 2 diabetes mellitus with foot ulcer: Secondary | ICD-10-CM

## 2013-08-02 DIAGNOSIS — L97509 Non-pressure chronic ulcer of other part of unspecified foot with unspecified severity: Secondary | ICD-10-CM

## 2013-08-02 DIAGNOSIS — M869 Osteomyelitis, unspecified: Secondary | ICD-10-CM

## 2013-08-02 DIAGNOSIS — M9080 Osteopathy in diseases classified elsewhere, unspecified site: Secondary | ICD-10-CM

## 2013-08-02 NOTE — Progress Notes (Signed)
Subjective:    Patient ID: [name], male    DOB: 08/05/1960, 53 y.o.   MRN: [id number]  HPI  53yo M with DM, PVD, hx of diabetic foot ulcer, recently hospitalized for polymicrobial bacteremia (enterococcus, proteus, mrsa) with worsening diabetic foot ulcer/osteo s/p right foot 1st and 2nd ray amputation on 5/8 and debridement of plantar foot ulcer on left foot (MRSA + wound cx). He was discharged on 6 wk, with vanco and amp/sub. Saw Dr. Johnnye Sima mid June who extended antibiotics for addn 4 wk. On July 8th, antibiotics changed to vanco and ertapenem due to [employer] on amp/sub. He is now here for follow up after 12 wks of IV antibiotics. He was able to get back to wound care with Dr. Jerline Pain who felt that patient is likely to have further amputation due to poor wound healing of his foot and wound dehiscence from recent 1st ray amputation. Patient is to see Dr. Sharol Given tomorrow for discussion for further surgery. He denies any fever, still has drainage, would like to do oxygen hyperbaric treatment to improve healing process.  7/14 sed rate 79, crp 4.2, cr 1.52  Current Outpatient Prescriptions on File Prior to Visit  Medication Sig Dispense Refill  . amLODipine (NORVASC) 5 MG tablet Take 1 tablet (5 mg total) by mouth daily.  30 tablet  2  . aspirin 325 MG EC tablet Take 325 mg by mouth daily.      . carvedilol (COREG) 3.125 MG tablet Take 3.125 mg by mouth 2 (two) times daily with a meal.      . esomeprazole (NEXIUM) 20 MG capsule Take 1 capsule (20 mg total) by mouth daily.  30 capsule  2  . gabapentin (NEURONTIN) 300 MG capsule Take 600 mg by mouth 3 (three) times daily.      . insulin aspart (NOVOLOG) 100 UNIT/ML injection Inject 11-27 Units into the skin 3 (three) times daily before meals.       . insulin glargine (LANTUS) 100 UNIT/ML injection Inject 70 Units into the skin at bedtime.      . metFORMIN (GLUCOPHAGE) 1000 MG tablet Take 1,000 mg by mouth 2 (two) times daily with a meal.        . rosuvastatin (CRESTOR) 5 MG tablet Take 5 mg by mouth at bedtime.       No current facility-administered medications on file prior to visit.   Active Ambulatory Problems    Diagnosis Date Noted  . Diabetes mellitus type 2, uncontrolled, with complications [id number]  . HYPERLIPIDEMIA 03/06/2008  . HYPERTENSION 03/06/2008  . Sexual dysfunction 04/30/2010  . Diabetic foot ulcer, bilateral 10/05/2011  . Critical lower limb ischemia, lt with ABI of 0.60 12/31/2011  . PVD (peripheral vascular disease) 12/31/2011  . S/P angioplasty with stent, 12/30/11, of Lt SFA, Post. tibialis and PTA of L. ant and post. tibial vessels 12/31/2011  . Type 2 diabetes mellitus with right diabetic foot ulcer 06/01/2012  . Chronic ulcer of left foot with fat layer exposed 06/29/2012  . Healthcare maintenance 02/15/2013  . Heart failure with preserved ejection fraction 03/27/2013  . Hyponatremia 05/10/2013  . Unspecified deficiency anemia 05/17/2013  . GERD (gastroesophageal reflux disease) 05/25/2013  . Proteus Mirabilis, MRSA, & Enterococcus Bacteremia 05/25/2013  . Normocytic anemia 05/31/2013  . Diabetes mellitus with foot ulcer and gangrene 08/03/2013   Resolved Ambulatory Problems    Diagnosis Date Noted  . BLURRED VISION 07/12/2008  . Cough 10/05/2011  . Hyponatremia 01/06/2012  . Ulcer  of right great toe due to diabetes mellitus 06/01/2012  . Cough 06/01/2012  . Foot osteomyelitis, left 06/29/2012  . Acute CHF 03/27/2013  . Sepsis 05/11/2013  . Osteomyelitis of right foot 05/11/2013  . Acute respiratory failure 05/11/2013  . Hyperglycemia 05/11/2013   Past Medical History  Diagnosis Date  . Hyperlipidemia   . Hypertension   . Osteomyelitis 2010  . Osteomyelitis of ankle or foot 05/2011  . Neuromuscular disorder   . PAD (peripheral artery disease)   . Pneumonia 2010  . Type II diabetes mellitus ~ 2002  . CHF (congestive heart failure)   . Gangrene      Review of Systems + poor  wound healing of right foot    Objective:   Physical Exam BP 160/83  Pulse 99  Temp(Src) 97.6 F (36.4 C) (Oral)  Wt 196 lb (88.905 kg) Physical Exam  Constitutional: He is oriented to person, place, and time. He appears well-developed and well-nourished. No distress.  Skin: right foot recently wrapped from wound care center Psychiatric: He has a normal mood and affect. His behavior is normal.   Labs: Lab Results  Component Value Date   ESRSEDRATE 111* 05/30/2013   Lab Results  Component Value Date   CRP 6.0* 05/30/2013   Imaging:  MRI:  Multiple amputations are noted involving the first, fourth and fifth  rays.  Diffuse soft tissue swelling/inflammation is noted consistent with  cellulitis. There is also diffuse myositis.  There is osteomyelitis involving the third and fourth metatarsal  heads, the third and fourth proximal phalanges, the remaining second  metatarsal shaft and the medial cuneiform. There is a open wound  along the medial aspect of the midfoot which extends right down to  the medial cuneiform.  IMPRESSION:  Osteomyelitis, cellulitis and myositis as discussed above.      Assessment & Plan:  Polymicrobial osteomyeltis- pull picc line on Tuesday, and switch to oral therapy, amox/clav. concur with dr. Jerline Pain, that he will likely need tm amputation. Will defer to Dr. Sharol Given for further surgical management. Return to clinic in 2-4wk

## 2013-08-03 ENCOUNTER — Encounter (HOSPITAL_COMMUNITY): Payer: Self-pay | Admitting: *Deleted

## 2013-08-03 ENCOUNTER — Encounter (HOSPITAL_COMMUNITY)
Admission: RE | Disposition: A | Discharge status: Home / Self Care | Payer: Self-pay | Source: Ambulatory Visit | Attending: Orthopedic Surgery

## 2013-08-03 ENCOUNTER — Ambulatory Visit (HOSPITAL_COMMUNITY)
Admission: RE | Admit: 2013-08-03 | Discharge: 2013-08-04 | Disposition: A | Discharge status: Home / Self Care | Payer: Medicaid Other | Source: Ambulatory Visit | Attending: Orthopedic Surgery | Admitting: Orthopedic Surgery

## 2013-08-03 ENCOUNTER — Encounter (HOSPITAL_COMMUNITY): Payer: Medicaid Other | Admitting: Anesthesiology

## 2013-08-03 ENCOUNTER — Ambulatory Visit (HOSPITAL_COMMUNITY): Payer: Medicaid Other | Admitting: Anesthesiology

## 2013-08-03 DIAGNOSIS — E1142 Type 2 diabetes mellitus with diabetic polyneuropathy: Secondary | ICD-10-CM | POA: Insufficient documentation

## 2013-08-03 DIAGNOSIS — M9080 Osteopathy in diseases classified elsewhere, unspecified site: Secondary | ICD-10-CM | POA: Insufficient documentation

## 2013-08-03 DIAGNOSIS — I96 Gangrene, not elsewhere classified: Secondary | ICD-10-CM | POA: Insufficient documentation

## 2013-08-03 DIAGNOSIS — E1149 Type 2 diabetes mellitus with other diabetic neurological complication: Secondary | ICD-10-CM | POA: Insufficient documentation

## 2013-08-03 DIAGNOSIS — E1159 Type 2 diabetes mellitus with other circulatory complications: Secondary | ICD-10-CM | POA: Diagnosis not present

## 2013-08-03 DIAGNOSIS — Z8701 Personal history of pneumonia (recurrent): Secondary | ICD-10-CM | POA: Insufficient documentation

## 2013-08-03 DIAGNOSIS — M869 Osteomyelitis, unspecified: Secondary | ICD-10-CM | POA: Diagnosis not present

## 2013-08-03 DIAGNOSIS — D649 Anemia, unspecified: Secondary | ICD-10-CM | POA: Diagnosis not present

## 2013-08-03 DIAGNOSIS — E1152 Type 2 diabetes mellitus with diabetic peripheral angiopathy with gangrene: Secondary | ICD-10-CM | POA: Diagnosis present

## 2013-08-03 DIAGNOSIS — E1169 Type 2 diabetes mellitus with other specified complication: Secondary | ICD-10-CM | POA: Insufficient documentation

## 2013-08-03 DIAGNOSIS — I10 Essential (primary) hypertension: Secondary | ICD-10-CM | POA: Diagnosis not present

## 2013-08-03 DIAGNOSIS — Z87891 Personal history of nicotine dependence: Secondary | ICD-10-CM | POA: Insufficient documentation

## 2013-08-03 DIAGNOSIS — L97509 Non-pressure chronic ulcer of other part of unspecified foot with unspecified severity: Secondary | ICD-10-CM

## 2013-08-03 DIAGNOSIS — E11621 Type 2 diabetes mellitus with foot ulcer: Secondary | ICD-10-CM

## 2013-08-03 HISTORY — DX: Gangrene, not elsewhere classified: I96

## 2013-08-03 HISTORY — PX: AMPUTATION: SHX166

## 2013-08-03 LAB — COMPREHENSIVE METABOLIC PANEL
ALBUMIN: 3.2 g/dL — AB (ref 3.5–5.2)
ALT: 10 U/L (ref 0–53)
AST: 10 U/L (ref 0–37)
Alkaline Phosphatase: 86 U/L (ref 39–117)
Anion gap: 14 (ref 5–15)
BUN: 17 mg/dL (ref 6–23)
CO2: 27 mEq/L (ref 19–32)
CREATININE: 1.36 mg/dL — AB (ref 0.50–1.35)
Calcium: 9.3 mg/dL (ref 8.4–10.5)
Chloride: 101 mEq/L (ref 96–112)
GFR calc Af Amer: 67 mL/min — ABNORMAL LOW (ref >90)
GFR, EST NON AFRICAN AMERICAN: 58 mL/min — AB (ref >90)
GLUCOSE: 100 mg/dL — AB (ref 70–99)
POTASSIUM: 3.9 meq/L (ref 3.7–5.3)
Sodium: 142 mEq/L (ref 137–147)
TOTAL PROTEIN: 7.2 g/dL (ref 6.0–8.3)
Total Bilirubin: 0.3 mg/dL (ref 0.3–1.2)

## 2013-08-03 LAB — GLUCOSE, CAPILLARY
GLUCOSE-CAPILLARY: 184 mg/dL — AB (ref 70–99)
Glucose-Capillary: 107 mg/dL — ABNORMAL HIGH (ref 70–99)
Glucose-Capillary: 68 mg/dL — ABNORMAL LOW (ref 70–99)
Glucose-Capillary: 166 mg/dL — ABNORMAL HIGH (ref 70–99)
Glucose-Capillary: 79 mg/dL (ref 70–99)

## 2013-08-03 LAB — CBC
HEMATOCRIT: 28.8 % — AB (ref 39.0–52.0)
HEMOGLOBIN: 9.7 g/dL — AB (ref 13.0–17.0)
MCH: 27.5 pg (ref 26.0–34.0)
MCHC: 33.7 g/dL (ref 30.0–36.0)
MCV: 81.6 fL (ref 78.0–100.0)
Platelets: 291 10*3/uL (ref 150–400)
RBC: 3.53 MIL/uL — ABNORMAL LOW (ref 4.22–5.81)
RDW: 14.2 % (ref 11.5–15.5)
WBC: 8.5 10*3/uL (ref 4.0–10.5)

## 2013-08-03 LAB — PROTIME-INR
INR: 1.11 (ref 0.00–1.49)
Prothrombin Time: 14.3 seconds (ref 11.6–15.2)

## 2013-08-03 LAB — APTT: APTT: 41 s — AB (ref 24–37)

## 2013-08-03 SURGERY — AMPUTATION, FOOT, PARTIAL
Anesthesia: Monitor Anesthesia Care | Site: Foot | Laterality: Right

## 2013-08-03 MED ORDER — INSULIN ASPART 100 UNIT/ML ~~LOC~~ SOLN
0.0000 [IU] | Freq: Three times a day (TID) | SUBCUTANEOUS | Status: DC
  Administered 2013-08-04: 16 [IU] via SUBCUTANEOUS
  Administered 2013-08-04: 5 [IU] via SUBCUTANEOUS

## 2013-08-03 MED ORDER — MIDAZOLAM HCL 2 MG/2ML IJ SOLN
INTRAMUSCULAR | Status: AC
  Filled 2013-08-03: qty 2

## 2013-08-03 MED ORDER — FENTANYL CITRATE 0.05 MG/ML IJ SOLN
INTRAMUSCULAR | Status: DC | PRN
  Administered 2013-08-03: 50 ug via INTRAVENOUS

## 2013-08-03 MED ORDER — HYDROMORPHONE HCL PF 1 MG/ML IJ SOLN
0.5000 mg | INTRAMUSCULAR | Status: DC | PRN
Start: 2013-08-03 — End: 2013-08-04

## 2013-08-03 MED ORDER — GLUCOSE 40 % PO GEL: 1.0000 | ORAL | Status: DC | PRN

## 2013-08-03 MED ORDER — SODIUM CHLORIDE 0.9 % IV SOLN: INTRAVENOUS | Status: DC

## 2013-08-03 MED ORDER — ONDANSETRON HCL 4 MG/2ML IJ SOLN: 4.0000 mg | Freq: Four times a day (QID) | INTRAMUSCULAR | Status: DC | PRN

## 2013-08-03 MED ORDER — METHOCARBAMOL 500 MG PO TABS
ORAL_TABLET | ORAL | Status: AC
  Administered 2013-08-03: 500 mg via ORAL
  Filled 2013-08-03: qty 1

## 2013-08-03 MED ORDER — FENTANYL CITRATE 0.05 MG/ML IJ SOLN
INTRAMUSCULAR | Status: AC
  Filled 2013-08-03: qty 5

## 2013-08-03 MED ORDER — DOCUSATE SODIUM 100 MG PO CAPS
100.0000 mg | ORAL_CAPSULE | Freq: Two times a day (BID) | ORAL | Status: DC
  Administered 2013-08-03 – 2013-08-04 (×2): 100 mg via ORAL
  Filled 2013-08-03 (×2): qty 1

## 2013-08-03 MED ORDER — MIDAZOLAM HCL 2 MG/2ML IJ SOLN
INTRAMUSCULAR | Status: AC
  Administered 2013-08-03: 2 mg
  Filled 2013-08-03: qty 2

## 2013-08-03 MED ORDER — ONDANSETRON HCL 4 MG/2ML IJ SOLN
INTRAMUSCULAR | Status: AC
  Filled 2013-08-03: qty 2

## 2013-08-03 MED ORDER — GLYCOPYRROLATE 0.2 MG/ML IJ SOLN
INTRAMUSCULAR | Status: AC
  Filled 2013-08-03: qty 2

## 2013-08-03 MED ORDER — GLUCOSE-VITAMIN C 4-6 GM-MG PO CHEW: 4.0000 | CHEWABLE_TABLET | ORAL | Status: DC | PRN

## 2013-08-03 MED ORDER — OXYCODONE-ACETAMINOPHEN 5-325 MG PO TABS
ORAL_TABLET | ORAL | Status: AC
  Administered 2013-08-03: 2 via ORAL
  Filled 2013-08-03: qty 2

## 2013-08-03 MED ORDER — CEFAZOLIN SODIUM-DEXTROSE 2-3 GM-% IV SOLR: 2.0000 g | INTRAVENOUS | Status: DC

## 2013-08-03 MED ORDER — LACTATED RINGERS IV SOLN
INTRAVENOUS | Status: DC | PRN
  Administered 2013-08-03: 16:00:00 via INTRAVENOUS

## 2013-08-03 MED ORDER — 0.9 % SODIUM CHLORIDE (POUR BTL) OPTIME
TOPICAL | Status: DC | PRN
  Administered 2013-08-03: 1000 mL

## 2013-08-03 MED ORDER — PROPOFOL 10 MG/ML IV BOLUS
INTRAVENOUS | Status: AC
  Filled 2013-08-03: qty 20

## 2013-08-03 MED ORDER — DEXTROSE 5 % IV SOLN: 500.0000 mg | Freq: Four times a day (QID) | INTRAVENOUS | Status: DC | PRN

## 2013-08-03 MED ORDER — FENTANYL CITRATE 0.05 MG/ML IJ SOLN
INTRAMUSCULAR | Status: AC
  Administered 2013-08-03: 50 ug
  Filled 2013-08-03: qty 2

## 2013-08-03 MED ORDER — INSULIN ASPART 100 UNIT/ML ~~LOC~~ SOLN
4.0000 [IU] | Freq: Three times a day (TID) | SUBCUTANEOUS | Status: DC
  Administered 2013-08-04 (×2): 4 [IU] via SUBCUTANEOUS

## 2013-08-03 MED ORDER — CEFAZOLIN SODIUM-DEXTROSE 2-3 GM-% IV SOLR
INTRAVENOUS | Status: AC
  Administered 2013-08-03: 2 g via INTRAVENOUS
  Filled 2013-08-03: qty 50

## 2013-08-03 MED ORDER — ARTIFICIAL TEARS OP OINT
TOPICAL_OINTMENT | OPHTHALMIC | Status: AC
  Filled 2013-08-03: qty 3.5

## 2013-08-03 MED ORDER — METOCLOPRAMIDE HCL 5 MG/ML IJ SOLN: 5.0000 mg | Freq: Three times a day (TID) | INTRAMUSCULAR | Status: DC | PRN

## 2013-08-03 MED ORDER — OXYCODONE-ACETAMINOPHEN 5-325 MG PO TABS
1.0000 | ORAL_TABLET | ORAL | Status: DC | PRN
  Administered 2013-08-03 – 2013-08-04 (×3): 2 via ORAL
  Filled 2013-08-03 (×2): qty 2

## 2013-08-03 MED ORDER — CEFAZOLIN SODIUM 1-5 GM-% IV SOLN
1.0000 g | Freq: Four times a day (QID) | INTRAVENOUS | Status: AC
  Administered 2013-08-03 – 2013-08-04 (×3): 1 g via INTRAVENOUS
  Filled 2013-08-03 (×2): qty 50

## 2013-08-03 MED ORDER — MIDAZOLAM HCL 5 MG/5ML IJ SOLN
INTRAMUSCULAR | Status: DC | PRN
  Administered 2013-08-03 (×2): 1 mg via INTRAVENOUS

## 2013-08-03 MED ORDER — ROPIVACAINE HCL 5 MG/ML IJ SOLN
INTRAMUSCULAR | Status: DC | PRN
  Administered 2013-08-03: 30 mL via PERINEURAL

## 2013-08-03 MED ORDER — ROCURONIUM BROMIDE 50 MG/5ML IV SOLN
INTRAVENOUS | Status: AC
  Filled 2013-08-03: qty 1

## 2013-08-03 MED ORDER — METHOCARBAMOL 500 MG PO TABS
500.0000 mg | ORAL_TABLET | Freq: Four times a day (QID) | ORAL | Status: DC | PRN
  Administered 2013-08-03: 500 mg via ORAL

## 2013-08-03 MED ORDER — METOCLOPRAMIDE HCL 10 MG PO TABS: 5.0000 mg | ORAL_TABLET | Freq: Three times a day (TID) | ORAL | Status: DC | PRN

## 2013-08-03 MED ORDER — PROPOFOL INFUSION 10 MG/ML OPTIME
INTRAVENOUS | Status: DC | PRN
  Administered 2013-08-03: 100 ug/kg/min via INTRAVENOUS

## 2013-08-03 MED ORDER — ONDANSETRON HCL 4 MG PO TABS: 4.0000 mg | ORAL_TABLET | Freq: Four times a day (QID) | ORAL | Status: DC | PRN

## 2013-08-03 SURGICAL SUPPLY — 39 items
BANDAGE GAUZE ELAST BULKY 4 IN (GAUZE/BANDAGES/DRESSINGS) ×4 IMPLANT
BLADE SAW SGTL HD 18.5X60.5X1. (BLADE) ×2 IMPLANT
BLADE SURG 10 STRL SS (BLADE) IMPLANT
BNDG COHESIVE 4X5 TAN STRL (GAUZE/BANDAGES/DRESSINGS) ×2 IMPLANT
BNDG GAUZE ELAST 4 BULKY (GAUZE/BANDAGES/DRESSINGS) ×2 IMPLANT
COVER SURGICAL LIGHT HANDLE (MISCELLANEOUS) ×2 IMPLANT
DRAPE U-SHAPE 47X51 STRL (DRAPES) ×2 IMPLANT
DRSG ADAPTIC 3X8 NADH LF (GAUZE/BANDAGES/DRESSINGS) ×2 IMPLANT
DRSG PAD ABDOMINAL 8X10 ST (GAUZE/BANDAGES/DRESSINGS) ×2 IMPLANT
DURAPREP 26ML APPLICATOR (WOUND CARE) ×2 IMPLANT
ELECT REM PT RETURN 9FT ADLT (ELECTROSURGICAL) ×2
ELECTRODE REM PT RTRN 9FT ADLT (ELECTROSURGICAL) ×1 IMPLANT
GLOVE BIOGEL M 7.0 STRL (GLOVE) ×2 IMPLANT
GLOVE BIOGEL PI IND STRL 6.5 (GLOVE) ×1 IMPLANT
GLOVE BIOGEL PI IND STRL 7.0 (GLOVE) ×1 IMPLANT
GLOVE BIOGEL PI IND STRL 7.5 (GLOVE) ×1 IMPLANT
GLOVE BIOGEL PI IND STRL 9 (GLOVE) ×1 IMPLANT
GLOVE BIOGEL PI INDICATOR 6.5 (GLOVE) ×1
GLOVE BIOGEL PI INDICATOR 7.0 (GLOVE) ×1
GLOVE BIOGEL PI INDICATOR 7.5 (GLOVE) ×1
GLOVE BIOGEL PI INDICATOR 9 (GLOVE) ×1
GLOVE SURG ORTHO 9.0 STRL STRW (GLOVE) ×2 IMPLANT
GLOVE SURG SS PI 7.0 STRL IVOR (GLOVE) ×2 IMPLANT
GOWN STRL REUS W/ TWL XL LVL3 (GOWN DISPOSABLE) ×3 IMPLANT
GOWN STRL REUS W/TWL XL LVL3 (GOWN DISPOSABLE) ×3
KIT BASIN OR (CUSTOM PROCEDURE TRAY) ×2 IMPLANT
KIT ROOM TURNOVER OR (KITS) ×2 IMPLANT
NS IRRIG 1000ML POUR BTL (IV SOLUTION) ×2 IMPLANT
PACK ORTHO EXTREMITY (CUSTOM PROCEDURE TRAY) ×2 IMPLANT
PAD ABD 8X10 STRL (GAUZE/BANDAGES/DRESSINGS) ×2 IMPLANT
PAD ARMBOARD 7.5X6 YLW CONV (MISCELLANEOUS) ×4 IMPLANT
SPONGE GAUZE 4X4 12PLY (GAUZE/BANDAGES/DRESSINGS) ×2 IMPLANT
SPONGE GAUZE 4X4 12PLY STER LF (GAUZE/BANDAGES/DRESSINGS) ×2 IMPLANT
SPONGE LAP 18X18 X RAY DECT (DISPOSABLE) ×2 IMPLANT
SUT ETHILON 2 0 PSLX (SUTURE) ×6 IMPLANT
SUT VIC AB 2-0 CTB1 (SUTURE) IMPLANT
TOWEL OR 17X24 6PK STRL BLUE (TOWEL DISPOSABLE) ×2 IMPLANT
TOWEL OR 17X26 10 PK STRL BLUE (TOWEL DISPOSABLE) ×2 IMPLANT
WATER STERILE IRR 1000ML POUR (IV SOLUTION) ×2 IMPLANT

## 2013-08-03 NOTE — Progress Notes (Signed)
Orthopedic Tech Progress Note Patient Details:  [name] [birth year] [id number] Unable to fit post op shoe, talked to [employer] they said may be able to make a shoe to fit. Patient ID: [name], male   DOB: 06-Mar-1960, 53 y.o.   MRN: [id number]   [name] 08/03/2013, 7:51 PM

## 2013-08-03 NOTE — Op Note (Signed)
08/03/2013  4:56 PM  PATIENT:  [name]    PRE-OPERATIVE DIAGNOSIS:  Gangrene osteomyelitis right midfoot and forefoot.   POST-OPERATIVE DIAGNOSIS:  Same  PROCEDURE:  AMPUTATION FOOT, right midfoot  SURGEON:  Daivd Fredericksen V, MD  PHYSICIAN ASSISTANT:None ANESTHESIA:   General  PREOPERATIVE INDICATIONS:  [name] is a  53 y.o. male with a diagnosis of Gangrene osteomyelitis right midfoot and forefoot.  who failed conservative measures and elected for surgical management.    The risks benefits and alternatives were discussed with the patient preoperatively including but not limited to the risks of infection, bleeding, nerve injury, cardiopulmonary complications, the need for revision surgery, among others, and the patient was willing to proceed.  OPERATIVE IMPLANTS: None  OPERATIVE FINDINGS: Calcified vessels margins mild ischemic areas  OPERATIVE PROCEDURE: Patient was brought to the operating room after undergoing a popliteal block. The right lower extremity was then prepped using DuraPrep draped into a sterile field. A fishmouth incision was made just proximal to the ischemic margins. There was good bleeding but primarily calcified vessels. Electrocautery was used for hemostasis. The foot was amputated through the midfoot with an oscillating saw. The wound was irrigated with normal saline. The incision was closed using 2-0 nylon. A sterile compressive dressing was applied. Patient was taken to the PACU in stable condition.

## 2013-08-03 NOTE — Anesthesia Preprocedure Evaluation (Signed)
Anesthesia Evaluation  Patient identified by MRN, date of birth, ID band Patient awake    Reviewed: Allergy & Precautions, H&P , NPO status , Patient's Chart, lab work & pertinent test results, reviewed documented beta blocker date and time   Airway       Dental   Pulmonary pneumonia -, former smoker,          Cardiovascular hypertension, + Peripheral Vascular Disease and +CHF     Neuro/Psych    GI/Hepatic   Endo/Other  diabetes  Renal/GU      Musculoskeletal   Abdominal   Peds  Hematology  (+) anemia ,   Anesthesia Other Findings   Reproductive/Obstetrics                           Anesthesia Physical Anesthesia Plan  ASA: III  Anesthesia Plan: MAC and Regional   Post-op Pain Management: MAC Combined w/ Regional for Post-op pain   Induction: Intravenous  Airway Management Planned: Mask  Additional Equipment:   Intra-op Plan:   Post-operative Plan:   Informed Consent: I have reviewed the patients History and Physical, chart, labs and discussed the procedure including the risks, benefits and alternatives for the proposed anesthesia with the patient or authorized representative who has indicated his/her understanding and acceptance.   Dental advisory given  Plan Discussed with: CRNA, Anesthesiologist and Surgeon  Anesthesia Plan Comments:         Anesthesia Quick Evaluation

## 2013-08-03 NOTE — Anesthesia Postprocedure Evaluation (Signed)
Anesthesia Post Note  Patient: [name]  Procedure(s) Performed: Procedure(s) (LRB): AMPUTATION FOOT (Right)  Anesthesia type: MAC  Patient location: PACU  Post pain: Pain level controlled and Adequate analgesia  Post assessment: Post-op Vital signs reviewed, Patient's Cardiovascular Status Stable and Respiratory Function Stable  Last Vitals:  Filed Vitals:   08/03/13 1700  BP: 112/60  Pulse:   Temp: 36.3 C  Resp: 20    Post vital signs: Reviewed and stable  Level of consciousness: awake, alert  and oriented  Complications: No apparent anesthesia complications

## 2013-08-03 NOTE — Anesthesia Procedure Notes (Addendum)
Anesthesia Regional Block:  Popliteal block  Pre-Anesthetic Checklist: ,, timeout performed, Correct Patient, Correct Site, Correct Laterality, Correct Procedure, Correct Position, site marked, Risks and benefits discussed,  Surgical consent,  Pre-op evaluation,  At surgeon's request and post-op pain management  Laterality: Lower and Right  Prep: chloraprep       Needles:  Injection technique: Single-shot  Needle Type: Echogenic Needle          Additional Needles:  Procedures: ultrasound guided (picture in chart) Popliteal block Narrative:  Start time: 08/03/2013 3:22 PM End time: 08/03/2013 3:31 PM Injection made incrementally with aspirations every 5 mL.  Performed by: Personally  Anesthesiologist: Nicklas Mcsweeney  Additional Notes: H+P and labs reviewed, risks and benefits discussed with patient, procedure tolerated well without complications
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
## 2013-08-03 NOTE — Progress Notes (Signed)
CBG low. Pt drinking coke & eating crackers with peanut butter. Will recheck CBG

## 2013-08-03 NOTE — Discharge Instructions (Signed)
Nonweightbearing on the right

## 2013-08-03 NOTE — Progress Notes (Signed)
Family has left the room.  Will take his 2 belongings bag and 1 medium size black "medicine" bag to PACU.  DA

## 2013-08-03 NOTE — Transfer of Care (Signed)
Immediate Anesthesia Transfer of Care Note  Patient: [name]  Procedure(s) Performed: Procedure(s) with comments: AMPUTATION FOOT (Right) - Right Midfoot Amputation  Patient Location: PACU  Anesthesia Type:MAC and Regional  Level of Consciousness: patient cooperative and responds to stimulation  Airway & Oxygen Therapy: Patient Spontanous Breathing and Patient connected to face mask oxygen  Post-op Assessment: Report given to PACU RN and Post -op Vital signs reviewed and stable  Post vital signs: Reviewed and stable  Complications: No apparent anesthesia complications

## 2013-08-04 DIAGNOSIS — E1169 Type 2 diabetes mellitus with other specified complication: Secondary | ICD-10-CM | POA: Diagnosis not present

## 2013-08-04 LAB — GLUCOSE, CAPILLARY
GLUCOSE-CAPILLARY: 453 mg/dL — AB (ref 70–99)
GLUCOSE-CAPILLARY: 346 mg/dL — AB (ref 70–99)
Glucose-Capillary: 381 mg/dL — ABNORMAL HIGH (ref 70–99)
Glucose-Capillary: 215 mg/dL — ABNORMAL HIGH (ref 70–99)

## 2013-08-04 LAB — MRSA PCR SCREENING: MRSA BY PCR: NEGATIVE

## 2013-08-04 MED ORDER — OXYCODONE-ACETAMINOPHEN 5-325 MG PO TABS: 1.0000 | ORAL_TABLET | ORAL | Status: DC | PRN

## 2013-08-04 MED ORDER — INSULIN ASPART 100 UNIT/ML ~~LOC~~ SOLN
20.0000 [IU] | Freq: Once | SUBCUTANEOUS | Status: AC
  Administered 2013-08-04: 20 [IU] via SUBCUTANEOUS

## 2013-08-04 NOTE — Progress Notes (Signed)
Discharge instructions gone over with patient. Prescription given. Home medications gone over. Follow up appointment to be made.  Diet, activity, signs and symptoms of infection and reasons to call the doctor gone over. My chart discussed. Picc line discontinued by iv team. Patient verbalized understanding of instructions.

## 2013-08-04 NOTE — Progress Notes (Signed)
Night nurse reported both cbgs, to Dr. Sharol Given, who is on floor. Orders received.

## 2013-08-04 NOTE — Evaluation (Signed)
Physical Therapy Evaluation Patient Details Name: [name] MRN: [id number] DOB: [birth year] Today's Date: 08/04/2013   History of Present Illness  pt presents with R Midfoot amputation.  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    Clinical Impression  Pt moving well and indicates has been moving like this since previous surgery.  No further PT needs at this time, will sign off.      Follow Up Recommendations No PT follow up;Supervision - Intermittent    Equipment Recommendations  None recommended by PT    Recommendations for Other Services       Precautions / Restrictions Precautions Precautions: None Restrictions Weight Bearing Restrictions: Yes RLE Weight Bearing: Non weight bearing      Mobility  Bed Mobility Overal bed mobility: Modified Independent                Transfers Overall transfer level: Modified independent Equipment used: None                Ambulation/Gait Ambulation/Gait assistance: Modified independent (Device/Increase time) Ambulation Distance (Feet): 40 Feet Assistive device: Rolling walker (2 wheeled) Gait Pattern/deviations: Step-to pattern   Gait velocity interpretation: Below normal speed for age/gender General Gait Details: pt demos good use of RW and indicates has been using one for some time now since his previous surgery.    Stairs            Wheelchair Mobility    Modified Rankin (Stroke Patients Only)       Balance Overall balance assessment: Needs assistance Sitting-balance support: No upper extremity supported;Feet supported Sitting balance-Leahy Scale: Normal     Standing balance support: Single extremity supported Standing balance-Leahy Scale: Poor Standing balance comment: pt only able to maintain balance briefly without UE support.                               Pertinent Vitals/Pain 2-3/10.  Premedicated.      Home Living Family/patient expects to be  discharged to:: Private residence Living Arrangements: Children Available Help at Discharge: Family;Friend(s);Available 24 hours/day Type of Home: House Home Access: Level entry     Home Layout: Two level;1/2 bath on main level Home Equipment: Wheelchair - manual;Crutches;Walker - 2 wheels      Prior Function Level of Independence: Independent               Hand Dominance   Dominant Hand: Right    Extremity/Trunk Assessment   Upper Extremity Assessment: Overall WFL for tasks assessed           Lower Extremity Assessment: RLE deficits/detail;LLE deficits/detail RLE Deficits / Details: R midfoot amputation, but otherwise knee and hip WFL.   LLE Deficits / Details: Hx Mid foot amputation, but otherwise WFL.       Communication   Communication: No difficulties  Cognition Arousal/Alertness: Awake/alert Behavior During Therapy: WFL for tasks assessed/performed Overall Cognitive Status: Within Functional Limits for tasks assessed                      General Comments      Exercises        Assessment/Plan    PT Assessment Patent does not need any further PT services  PT Diagnosis     PT Problem List    PT Treatment Interventions     PT Goals (Current goals can be found in the Care Plan section)  Acute Rehab PT Goals PT Goal Formulation: No goals set, d/c therapy    Frequency     Barriers to discharge        Co-evaluation               End of Session   Activity Tolerance: Patient tolerated treatment well Patient left: in bed;with call bell/phone within reach Nurse Communication: Mobility status    Functional Assessment Tool Used: Clinical Judgement Functional Limitation: Mobility: Walking and moving around Mobility: Walking and Moving Around Current Status [id number]): 0 percent impaired, limited or restricted Mobility: Walking and Moving Around Goal Status [phone]): 0 percent impaired, limited or restricted Mobility: Walking and Moving  Around Discharge Status [phone]): 0 percent impaired, limited or restricted    Time: [id number] PT Time Calculation (min): 19 min   Charges:   PT Evaluation $Initial PT Evaluation Tier I: 1 Procedure PT Treatments $Gait Training: 8-22 mins   PT G Codes:   Functional Assessment Tool Used: Clinical Judgement Functional Limitation: Mobility: Walking and moving around    [place]ornton Papas, [place] [phone] 08/04/2013, 10:55 AM

## 2013-08-04 NOTE — Discharge Summary (Signed)
Physician Discharge Summary  Patient ID: [name] MRN: [id number] DOB/AGE: Sep 23, 1960 53 y.o.  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 He underwent a midfoot amputation. Postoperatively patient progressed well and was discharged to home in stable condition.  Consults: None  Significant Diagnostic Studies: labs: Routine labs  Treatments: surgery: See operative note  Discharge Exam: Blood pressure 153/65, pulse 97, temperature 98.1 F (36.7 C), temperature source Oral, resp. rate 20, height 6' (1.829 m), weight 88.905 kg (196 lb), SpO2 99.00%. Incision/Wound: dressing clean and dry  Disposition: 01-Home or Self Care     Medication List    ASK your doctor about these medications       amLODipine 5 MG tablet  Commonly known as:  NORVASC  Take 1 tablet (5 mg total) by mouth daily.     aspirin 325 MG EC tablet  Take 325 mg by mouth daily.     carvedilol 3.125 MG tablet  Commonly known as:  COREG  Take 3.125 mg by mouth 2 (two) times daily with a meal.     esomeprazole 20 MG capsule  Commonly known as:  NEXIUM  Take 1 capsule (20 mg total) by mouth daily.     furosemide 20 MG tablet  Commonly known as:  LASIX  Take 40 mg by mouth daily.     gabapentin 300 MG capsule  Commonly known as:  NEURONTIN  Take 600 mg by mouth 3 (three) times daily.     insulin aspart 100 UNIT/ML injection  Commonly known as:  novoLOG  Inject 11-27 Units into the skin 3 (three) times daily before meals.     insulin glargine 100 UNIT/ML injection  Commonly known as:  LANTUS  Inject 70 Units into the skin at bedtime.     metFORMIN 1000 MG tablet  Commonly known as:  GLUCOPHAGE  Take 1,000 mg  by mouth 2 (two) times daily with a meal.     nitroGLYCERIN 0.2 mg/hr patch  Commonly known as:  NITRODUR - Dosed in mg/24 hr  Place 0.2 mg onto the skin daily. Apply to right foot     rosuvastatin 5 MG tablet  Commonly known as:  CRESTOR  Take 5 mg by mouth at bedtime.     traMADol 50 MG tablet  Commonly known as:  ULTRAM  Take 50 mg by mouth 2 (two) times daily as needed (pain).           Follow-up Information   Follow up with Jahel Wavra V, MD In 1 week.   Specialty:  Orthopedic Surgery   Contact information:   [place] [address] [phone]       Signed: [name] 08/04/2013, 8:02 AM

## 2013-08-04 NOTE — Progress Notes (Signed)
Subjective: 1 Day Post-Op Procedure(s) (LRB): AMPUTATION FOOT (Right) Patient reports pain as mild.    Objective: Vital signs in last 24 hours: Temp:  [97.3 F (36.3 C)-98.2 F (36.8 C)] 98.1 F (36.7 C) (08/01 0504) Pulse Rate:  [87-100] 97 (08/01 0504) Resp:  [14-22] 20 (08/01 0504) BP: (112-165)/(51-81) 153/65 mmHg (08/01 0504) SpO2:  [98 %-100 %] 99 % (08/01 0504) Weight:  [88.905 kg (196 lb)] 88.905 kg (196 lb) (07/31 1359)  Intake/Output from previous day: 07/31 0701 - 08/01 0700 In: [place] [P.O.:1680; I.V.:1410] Out: 1200 [Urine:1150; Blood:50] Intake/Output this shift: Total I/O In: -  Out: 750 [Urine:750]   Recent Labs  08/03/13 1428  HGB 9.7*    Recent Labs  08/03/13 1428  WBC 8.5  RBC 3.53*  HCT 28.8*  PLT 291    Recent Labs  08/03/13 1428  NA 142  K 3.9  CL 101  CO2 27  BUN 17  CREATININE 1.36*  GLUCOSE 100*  CALCIUM 9.3    Recent Labs  08/03/13 1428  INR 1.11    dressing dry  Assessment/Plan: 1 Day Post-Op Procedure(s) (LRB): AMPUTATION FOOT (Right) Plan discharge home  [place] C 08/04/2013, 9:41 AM

## 2013-08-06 ENCOUNTER — Telehealth: Payer: Self-pay | Admitting: *Deleted

## 2013-08-06 ENCOUNTER — Encounter (HOSPITAL_COMMUNITY): Payer: Self-pay | Admitting: Orthopedic Surgery

## 2013-08-06 ENCOUNTER — Encounter: Payer: Self-pay | Admitting: Internal Medicine

## 2013-08-06 ENCOUNTER — Encounter (HOSPITAL_BASED_OUTPATIENT_CLINIC_OR_DEPARTMENT_OTHER): Payer: Medicaid Other | Attending: General Surgery

## 2013-08-06 NOTE — Telephone Encounter (Signed)
Heather, [place], called to let [facility] know that the patient's PICC was pulled Sunday 8/2 when he was discharged from the hospital.  Per Nira Conn, patient has preferred to do his own wound care, declines pictures and wound measurement in the past.  She will be contacting Dr. Jess Barters office as to whether they will enlist [facility] for wound management.   As the patient no longer has IV antibiotics, [facility] will be discharging him from their home health nursing services (pending further direction from Dr. Sharol Given).  It appears that the patient is not on oral antibiotics.  Please advise if he should be post op. Landis Gandy, RN

## 2013-08-07 ENCOUNTER — Other Ambulatory Visit: Payer: Self-pay | Admitting: Internal Medicine

## 2013-08-07 ENCOUNTER — Other Ambulatory Visit: Payer: Self-pay | Admitting: *Deleted

## 2013-08-07 ENCOUNTER — Ambulatory Visit (HOSPITAL_COMMUNITY): Payer: Medicaid Other

## 2013-08-07 DIAGNOSIS — M869 Osteomyelitis, unspecified: Principal | ICD-10-CM

## 2013-08-07 DIAGNOSIS — E1169 Type 2 diabetes mellitus with other specified complication: Secondary | ICD-10-CM

## 2013-08-07 MED ORDER — AMOXICILLIN-POT CLAVULANATE 875-125 MG PO TABS: ORAL_TABLET | ORAL | Status: DC

## 2013-08-07 NOTE — Telephone Encounter (Signed)
i read the OR note, somewhat concerning since Sharol Given quoted a 50/50 chance of wound healing from the TM amputation. It is unclear if he got clear margins since gangrene went up to the midfoot and he initially recommended a trans-tibial amputation. Based on his old micro data has has mixed bacterial infection. Would treat with amox/clav 875 BID x 2 wk, then have him come back for  Labs to do cbc with diff, sed rate and crp ( i will place them in) and see if need to extend at that time. Do you mind calling him to let him know about antibiotics and labs  thanks

## 2013-08-07 NOTE — Telephone Encounter (Signed)
Unable to get in touch with the patient regarding medications or labs (phone is disconnected). Called Dr. Jess Barters office for an updated number, left message with his daughter (emergency contact) asking him to contact [facility].  Patient is scheduled to follow up with Dr. Sharol Given on 8/14.  Will send the amoxicillin to the patient's pharmacy.

## 2013-08-07 NOTE — Telephone Encounter (Signed)
Sent amox/clauv 875 mg BID #28 R0 to pharmacy.

## 2013-08-09 NOTE — Telephone Encounter (Signed)
Needs to be aware of his follow up appointment, should continue augmentin until he is seen per Dr. Baxter Flattery.

## 2013-08-15 ENCOUNTER — Telehealth: Payer: Self-pay | Admitting: Dietician

## 2013-08-15 NOTE — Telephone Encounter (Signed)
Discussed with patient the necessity of a yearly eye exam with diabetes dx and that he can have it done here at his next office visit  and that the cost is covered by the orange card.

## 2013-08-21 ENCOUNTER — Telehealth: Payer: Self-pay | Admitting: *Deleted

## 2013-08-21 ENCOUNTER — Other Ambulatory Visit: Payer: Self-pay | Admitting: *Deleted

## 2013-08-21 DIAGNOSIS — E1169 Type 2 diabetes mellitus with other specified complication: Secondary | ICD-10-CM

## 2013-08-21 DIAGNOSIS — M869 Osteomyelitis, unspecified: Principal | ICD-10-CM

## 2013-08-21 MED ORDER — AMOXICILLIN-POT CLAVULANATE 875-125 MG PO TABS: ORAL_TABLET | ORAL | Status: DC

## 2013-08-21 NOTE — Telephone Encounter (Signed)
Phoned in refill of antibiotics to be continued until patient is seen on 9/9 per Dr. Baxter Flattery.  Patient aware of appointment 9/9 10:00.  He will continue the medications as prescribed. Pt states Dr. Sharol Given said his foot was healing well, reports no issues with the medication. Landis Gandy, RN

## 2013-08-30 ENCOUNTER — Encounter: Payer: Self-pay | Admitting: Internal Medicine

## 2013-08-30 ENCOUNTER — Ambulatory Visit (HOSPITAL_COMMUNITY)
Admission: RE | Admit: 2013-08-30 | Discharge: 2013-08-30 | Disposition: A | Discharge status: Home / Self Care | Payer: Medicaid Other | Source: Ambulatory Visit | Attending: Otolaryngology | Admitting: Otolaryngology

## 2013-08-30 ENCOUNTER — Ambulatory Visit (INDEPENDENT_AMBULATORY_CARE_PROVIDER_SITE_OTHER): Payer: Medicaid Other | Admitting: Internal Medicine

## 2013-08-30 ENCOUNTER — Ambulatory Visit (HOSPITAL_COMMUNITY)
Admission: RE | Admit: 2013-08-30 | Discharge: 2013-08-30 | Disposition: A | Discharge status: Home / Self Care | Payer: Medicaid Other | Source: Ambulatory Visit | Attending: Internal Medicine | Admitting: Internal Medicine

## 2013-08-30 VITALS — BP 160/86 | HR 109 | Temp 98.2°F | Wt 192.5 lb

## 2013-08-30 DIAGNOSIS — J811 Chronic pulmonary edema: Secondary | ICD-10-CM | POA: Insufficient documentation

## 2013-08-30 DIAGNOSIS — R0989 Other specified symptoms and signs involving the circulatory and respiratory systems: Secondary | ICD-10-CM

## 2013-08-30 DIAGNOSIS — R918 Other nonspecific abnormal finding of lung field: Secondary | ICD-10-CM | POA: Insufficient documentation

## 2013-08-30 DIAGNOSIS — M869 Osteomyelitis, unspecified: Secondary | ICD-10-CM

## 2013-08-30 DIAGNOSIS — M79609 Pain in unspecified limb: Secondary | ICD-10-CM

## 2013-08-30 DIAGNOSIS — IMO0002 Reserved for concepts with insufficient information to code with codable children: Secondary | ICD-10-CM

## 2013-08-30 DIAGNOSIS — M792 Neuralgia and neuritis, unspecified: Secondary | ICD-10-CM

## 2013-08-30 DIAGNOSIS — M79604 Pain in right leg: Secondary | ICD-10-CM

## 2013-08-30 DIAGNOSIS — E118 Type 2 diabetes mellitus with unspecified complications: Secondary | ICD-10-CM

## 2013-08-30 DIAGNOSIS — I10 Essential (primary) hypertension: Secondary | ICD-10-CM

## 2013-08-30 DIAGNOSIS — M7989 Other specified soft tissue disorders: Secondary | ICD-10-CM

## 2013-08-30 DIAGNOSIS — E1165 Type 2 diabetes mellitus with hyperglycemia: Secondary | ICD-10-CM

## 2013-08-30 LAB — CBC WITH DIFFERENTIAL/PLATELET
Basophils Absolute: 0 10*3/uL (ref 0.0–0.1)
Basophils Relative: 0 % (ref 0–1)
Eosinophils Absolute: 0.1 10*3/uL (ref 0.0–0.7)
Eosinophils Relative: 1 % (ref 0–5)
HEMATOCRIT: 29.6 % — AB (ref 39.0–52.0)
Hemoglobin: 9.6 g/dL — ABNORMAL LOW (ref 13.0–17.0)
LYMPHS PCT: 17 % (ref 12–46)
Lymphs Abs: 1.6 10*3/uL (ref 0.7–4.0)
MCH: 26.1 pg (ref 26.0–34.0)
MCHC: 32.4 g/dL (ref 30.0–36.0)
MCV: 80.4 fL (ref 78.0–100.0)
MONO ABS: 0.4 10*3/uL (ref 0.1–1.0)
Monocytes Relative: 4 % (ref 3–12)
NEUTROS ABS: 7.2 10*3/uL (ref 1.7–7.7)
NEUTROS PCT: 78 % — AB (ref 43–77)
Platelets: 404 10*3/uL — ABNORMAL HIGH (ref 150–400)
RBC: 3.68 MIL/uL — ABNORMAL LOW (ref 4.22–5.81)
RDW: 15.2 % (ref 11.5–15.5)
WBC: 9.2 10*3/uL (ref 4.0–10.5)

## 2013-08-30 LAB — POCT GLYCOSYLATED HEMOGLOBIN (HGB A1C): Hemoglobin A1C: 8.6

## 2013-08-30 LAB — C-REACTIVE PROTEIN: CRP: 10 mg/dL — AB (ref <0.60)

## 2013-08-30 LAB — GLUCOSE, CAPILLARY: Glucose-Capillary: 215 mg/dL — ABNORMAL HIGH (ref 70–99)

## 2013-08-30 MED ORDER — TRAMADOL HCL 50 MG PO TABS: 50.0000 mg | ORAL_TABLET | Freq: Two times a day (BID) | ORAL | Status: DC | PRN

## 2013-08-30 MED ORDER — FUROSEMIDE 20 MG PO TABS: 40.0000 mg | ORAL_TABLET | Freq: Every day | ORAL | Status: DC

## 2013-08-30 MED ORDER — CARVEDILOL 3.125 MG PO TABS: 3.1250 mg | ORAL_TABLET | Freq: Two times a day (BID) | ORAL | Status: DC

## 2013-08-30 MED ORDER — GABAPENTIN 300 MG PO CAPS: 600.0000 mg | ORAL_CAPSULE | Freq: Three times a day (TID) | ORAL | Status: DC

## 2013-08-30 NOTE — Patient Instructions (Signed)
We will call you with the results of your lower rt leg ultrasound later today.

## 2013-08-30 NOTE — Progress Notes (Signed)
*  PRELIMINARY RESULTS* Vascular Ultrasound Right lower extremity venous duplex has been completed.  Preliminary findings: No evidence of DVT.  Called results to Dr. Hulen Luster.  Landry Mellow, RDMS, RVT  08/30/2013, 4:56 PM

## 2013-08-31 DIAGNOSIS — M79606 Pain in leg, unspecified: Secondary | ICD-10-CM | POA: Insufficient documentation

## 2013-08-31 DIAGNOSIS — R0989 Other specified symptoms and signs involving the circulatory and respiratory systems: Secondary | ICD-10-CM | POA: Insufficient documentation

## 2013-08-31 LAB — SEDIMENTATION RATE: SED RATE: 122 mm/h — AB (ref 0–16)

## 2013-08-31 NOTE — Assessment & Plan Note (Addendum)
Pt reports when laying on back at night he wakes up with congestion in the morning which he is able to cough out. He denies fevers, chills, or night sweats. Currently on augmentin 875mg  BID for mixed bacteria noted on old microbiology results prior to rt mid foot amputation on 8/1.  - xray of chest after clinic revealed:  increased density presumably in the right lower lobe posteriorly consistent with atelectasis or focal pneumonia. Overlap of normal bony structures may be contributing to the density. Followup films following therapy are recommended to assure complete clearing. - pt currently on augmentin that should cover CAP. However pt has hx of picc line that was removed 8/3, recent hospitalization for rt midfoot amputation on 7/31-8/1, and on 5/7-5/18 he was admitted for polymicrobial bacteremia (enterococcus, proteus, MRSA). Therefore PNA would be considered HCAP, may need to add coverage for MRSA if patient's congestion worsens. If so will get repeat 2 view chest xray at that time. Will call patient to inform him of xray results and advise to return to clinic if he develops fever, NS, or chills.  - However, less likely pt has PNA and more likely atelectasis. Can try incentive spirometry if congestion does not improve. - pt has an appt w/ Dr. Baxter Flattery 9/14, will send this clinic note for her review

## 2013-08-31 NOTE — Progress Notes (Signed)
Subjective:     Patient ID: [name], male   DOB: 1960-02-13, 53 y.o.   MRN: [id number]  HPI Pt is a 53 y/o male w/ PMHx of DM, HTN, PVD, and midfoot amputation b/l who presents to clinic for acute swelling of rt leg x 3 days. Pt had a rt midfoot amputation on 8/1 due to osteomyelitis and reports that pain went away after surgery. Then recently 3 days ago he started having swelling in rt leg w/ pain underneath calf muscle. Pain and swelling only come when he is ambulating. When he sits down pain goes away and when he elevates rt leg swelling goes away. He has been sedentary lately 2/2 difficulty w/ ambulation post op surgery and has been laying in bed all day but will go downstairs in his house for meals. He does not smoke cigarettes. He is requesting referral to see wound care, specifically w/ Dr. Jerline Pain weekly b/c he is afraid of further complications w/ osteomyelitis and does not want any further amputations. He has an appointment w/ Dr. Sharol Given w/ vascular surgery who performed his amputation  tomorrow. For his neuropathic pain, he states it is well controlled w/ gabapentin and tramadol which he is requesting refills of today. He is also on oxycodone which controls leg pain. Dr. Baxter Flattery prescribed him augmentin 875mg  BID x 2 weeks based on mixed bacterial infxn micro data, and then prescribed him another 42 pills which he brought in w/ him today that he reports compliance with.   Pt is also complaining of morning time congestion. When he sleeps on his back at night he will wake up with a feeling of something stuck in his throat below midsternum and will cough it out. However, when he sleeps on his side he does not wake up with that sensation. He states that it is different from his acid reflux b/c it is more of a congestion feeling versus burning sensation.    Review of Systems  Constitutional: Negative for fever and chills.  HENT: Positive for congestion.   Respiratory: Positive for cough.    Cardiovascular: Positive for leg swelling (right leg).       Feeling of something stuck mid sternum in throat        Objective:   Physical Exam  Constitutional: He appears well-developed and well-nourished.  Neck:  Burn scars on posterior neck  Cardiovascular: Normal rate and regular rhythm.   Pulmonary/Chest: Effort normal and breath sounds normal.  Abdominal: Soft. Bowel sounds are normal.  Musculoskeletal:  1+ pitting edema in right leg w/ trace pitting on the left, no erythema noted on legs, rt midfoot amputation wound healing well w/ beefy red flesh at suture margins w/ no pus. No tenderness to palpation.        Assessment:     Please see problem based assessment and plan.       Plan:     Please see problem based assessment and plan.

## 2013-08-31 NOTE — Assessment & Plan Note (Signed)
Pt presents to clinic w/ acute (3 days) onset of posterior rt leg pain beneath calf. Venous dopplers negative for DVT. Please see neuropathic foot pain for further A&P.

## 2013-08-31 NOTE — Assessment & Plan Note (Signed)
Pt had a rt mid foot amputation on 8/1. It was initially recommend that he have a trans-tibial amputation and noted that he has a 50/50 chance of healing from rt midfoot amputation. Now w/ acute swelling and pain x 3 days in rt leg that is concerning for DVT due to recent surgery and location of pain beneath calf muscle. However, pain and swelling go away w/ rest and could be a post op complication. Pt has an appointment w/ Dr. Sharol Given tomorrow 8/28. Pain is well controlled w/ oxycodone, gabapentin, and tramadol. He is currently on augmentin 810m BID for mixed bacteria seen on old microbiology data.  - refilled tramadol 555mBID and gabapentin 6004mID  - referral for ambulatory wound care made - ESR, CRP, and CBC w/ diff  - U/S of rt leg to r/o DVT 2/2 recent sx, acute swelling, and pain beneath calf (came back same day as negative, is on aspirin daily)  Update 08/31/13: CBC revealed WBC 9.2 w/ 7.2 neutrophils, ESR 122, and CRP 10. Will send results to Dr. SniBaxter Flattery

## 2013-08-31 NOTE — Assessment & Plan Note (Signed)
Lab Results  Component Value Date   HGBA1C 8.6 08/30/2013   HGBA1C 11.5* 05/12/2013   HGBA1C >14.0 02/15/2013     Assessment: Diabetes control:  not well controlled Progress toward A1C goal:   progressing towards goal Comments: on metformin 1000mg  BID, novolog 11-27 units TID w/ meals (uses sliding scale) and lantus 70units   Plan: Medications:  continue current medications Other plans: will f/u in 1 month for medication review

## 2013-08-31 NOTE — Assessment & Plan Note (Signed)
BP Readings from Last 3 Encounters:  08/30/13 160/86  08/04/13 153/65  08/04/13 153/65    Lab Results  Component Value Date   NA 142 08/03/2013   K 3.9 08/03/2013   CREATININE 1.36* 08/03/2013    Assessment: Blood pressure control:  not well controlled, could be 2/2 pain Progress toward BP goal:   deteriorated Comments: on norvasc 5mg  daily, lasix 40mg  daily, and coreg 3.125 BID  Plan: Medications:  continue current medications Other plans: refilled coreg and lasix. WIll f/u in 1 month for BP check and medication review

## 2013-09-06 ENCOUNTER — Other Ambulatory Visit (HOSPITAL_COMMUNITY): Payer: Self-pay | Admitting: Orthopedic Surgery

## 2013-09-06 ENCOUNTER — Encounter (HOSPITAL_COMMUNITY): Payer: Self-pay | Admitting: *Deleted

## 2013-09-06 ENCOUNTER — Encounter (HOSPITAL_COMMUNITY): Payer: Self-pay | Admitting: Pharmacy Technician

## 2013-09-06 NOTE — Progress Notes (Signed)
09/06/13 1921  OBSTRUCTIVE SLEEP APNEA  Have you ever been diagnosed with sleep apnea through a sleep study? No  Do you snore loudly (loud enough to be heard through closed doors)?  0  Do you often feel tired, fatigued, or sleepy during the daytime? 0  Has anyone observed you stop breathing during your sleep? 0  Do you have, or are you being treated for high blood pressure? 1  BMI more than 35 kg/m2? 0  Age over 53 years old? 1  Neck circumference greater than 40 cm/16 inches? 1  Gender: 1  Obstructive Sleep Apnea Score 4  Score 4 or greater  Results sent to PCP

## 2013-09-07 ENCOUNTER — Encounter (HOSPITAL_COMMUNITY): Payer: Self-pay | Admitting: Certified Registered"

## 2013-09-07 ENCOUNTER — Encounter (HOSPITAL_COMMUNITY): Payer: Medicaid Other | Admitting: Certified Registered"

## 2013-09-07 ENCOUNTER — Inpatient Hospital Stay (HOSPITAL_COMMUNITY)
Admission: RE | Admit: 2013-09-07 | Discharge: 2013-09-18 | DRG: 474 | Disposition: A | Discharge status: Rehabilitation Facility | Payer: Medicaid Other | Source: Ambulatory Visit | Attending: Orthopedic Surgery | Admitting: Orthopedic Surgery

## 2013-09-07 ENCOUNTER — Inpatient Hospital Stay (HOSPITAL_COMMUNITY): Payer: Medicaid Other

## 2013-09-07 ENCOUNTER — Inpatient Hospital Stay (HOSPITAL_COMMUNITY): Payer: Medicaid Other | Admitting: Certified Registered"

## 2013-09-07 ENCOUNTER — Encounter (HOSPITAL_COMMUNITY)
Admission: RE | Disposition: A | Discharge status: Rehabilitation Facility | Payer: Self-pay | Source: Ambulatory Visit | Attending: Orthopedic Surgery

## 2013-09-07 DIAGNOSIS — E785 Hyperlipidemia, unspecified: Secondary | ICD-10-CM | POA: Diagnosis present

## 2013-09-07 DIAGNOSIS — E1142 Type 2 diabetes mellitus with diabetic polyneuropathy: Secondary | ICD-10-CM | POA: Diagnosis present

## 2013-09-07 DIAGNOSIS — N170 Acute kidney failure with tubular necrosis: Secondary | ICD-10-CM

## 2013-09-07 DIAGNOSIS — E1149 Type 2 diabetes mellitus with other diabetic neurological complication: Secondary | ICD-10-CM | POA: Diagnosis present

## 2013-09-07 DIAGNOSIS — I10 Essential (primary) hypertension: Secondary | ICD-10-CM | POA: Diagnosis present

## 2013-09-07 DIAGNOSIS — T8789 Other complications of amputation stump: Secondary | ICD-10-CM | POA: Diagnosis present

## 2013-09-07 DIAGNOSIS — S98919A Complete traumatic amputation of unspecified foot, level unspecified, initial encounter: Secondary | ICD-10-CM | POA: Diagnosis not present

## 2013-09-07 DIAGNOSIS — E1169 Type 2 diabetes mellitus with other specified complication: Secondary | ICD-10-CM

## 2013-09-07 DIAGNOSIS — D649 Anemia, unspecified: Secondary | ICD-10-CM | POA: Diagnosis present

## 2013-09-07 DIAGNOSIS — I959 Hypotension, unspecified: Secondary | ICD-10-CM | POA: Diagnosis present

## 2013-09-07 DIAGNOSIS — I96 Gangrene, not elsewhere classified: Secondary | ICD-10-CM | POA: Diagnosis present

## 2013-09-07 DIAGNOSIS — E1165 Type 2 diabetes mellitus with hyperglycemia: Secondary | ICD-10-CM | POA: Diagnosis present

## 2013-09-07 DIAGNOSIS — R6520 Severe sepsis without septic shock: Secondary | ICD-10-CM

## 2013-09-07 DIAGNOSIS — K929 Disease of digestive system, unspecified: Secondary | ICD-10-CM | POA: Diagnosis present

## 2013-09-07 DIAGNOSIS — T4275XA Adverse effect of unspecified antiepileptic and sedative-hypnotic drugs, initial encounter: Secondary | ICD-10-CM | POA: Diagnosis not present

## 2013-09-07 DIAGNOSIS — I739 Peripheral vascular disease, unspecified: Secondary | ICD-10-CM | POA: Diagnosis present

## 2013-09-07 DIAGNOSIS — L97522 Non-pressure chronic ulcer of other part of left foot with fat layer exposed: Secondary | ICD-10-CM

## 2013-09-07 DIAGNOSIS — E860 Dehydration: Secondary | ICD-10-CM | POA: Diagnosis not present

## 2013-09-07 DIAGNOSIS — IMO0002 Reserved for concepts with insufficient information to code with codable children: Secondary | ICD-10-CM

## 2013-09-07 DIAGNOSIS — I509 Heart failure, unspecified: Secondary | ICD-10-CM | POA: Diagnosis present

## 2013-09-07 DIAGNOSIS — K219 Gastro-esophageal reflux disease without esophagitis: Secondary | ICD-10-CM | POA: Diagnosis present

## 2013-09-07 DIAGNOSIS — G8918 Other acute postprocedural pain: Secondary | ICD-10-CM | POA: Diagnosis present

## 2013-09-07 DIAGNOSIS — Z89511 Acquired absence of right leg below knee: Secondary | ICD-10-CM

## 2013-09-07 DIAGNOSIS — Y835 Amputation of limb(s) as the cause of abnormal reaction of the patient, or of later complication, without mention of misadventure at the time of the procedure: Secondary | ICD-10-CM | POA: Diagnosis present

## 2013-09-07 DIAGNOSIS — L97509 Non-pressure chronic ulcer of other part of unspecified foot with unspecified severity: Secondary | ICD-10-CM

## 2013-09-07 DIAGNOSIS — E119 Type 2 diabetes mellitus without complications: Secondary | ICD-10-CM | POA: Diagnosis present

## 2013-09-07 DIAGNOSIS — K560 Paralytic ileus: Secondary | ICD-10-CM | POA: Diagnosis not present

## 2013-09-07 DIAGNOSIS — E1152 Type 2 diabetes mellitus with diabetic peripheral angiopathy with gangrene: Secondary | ICD-10-CM

## 2013-09-07 DIAGNOSIS — Z87891 Personal history of nicotine dependence: Secondary | ICD-10-CM

## 2013-09-07 DIAGNOSIS — D638 Anemia in other chronic diseases classified elsewhere: Secondary | ICD-10-CM | POA: Diagnosis present

## 2013-09-07 DIAGNOSIS — K567 Ileus, unspecified: Secondary | ICD-10-CM | POA: Diagnosis not present

## 2013-09-07 DIAGNOSIS — Z95820 Peripheral vascular angioplasty status with implants and grafts: Secondary | ICD-10-CM

## 2013-09-07 DIAGNOSIS — R5383 Other fatigue: Secondary | ICD-10-CM | POA: Diagnosis not present

## 2013-09-07 DIAGNOSIS — M869 Osteomyelitis, unspecified: Secondary | ICD-10-CM | POA: Diagnosis present

## 2013-09-07 DIAGNOSIS — M9080 Osteopathy in diseases classified elsewhere, unspecified site: Secondary | ICD-10-CM | POA: Diagnosis present

## 2013-09-07 DIAGNOSIS — K9189 Other postprocedural complications and disorders of digestive system: Secondary | ICD-10-CM

## 2013-09-07 DIAGNOSIS — Z5189 Encounter for other specified aftercare: Secondary | ICD-10-CM | POA: Diagnosis not present

## 2013-09-07 DIAGNOSIS — A4189 Other specified sepsis: Secondary | ICD-10-CM | POA: Diagnosis present

## 2013-09-07 DIAGNOSIS — A419 Sepsis, unspecified organism: Secondary | ICD-10-CM | POA: Diagnosis present

## 2013-09-07 DIAGNOSIS — E11621 Type 2 diabetes mellitus with foot ulcer: Secondary | ICD-10-CM

## 2013-09-07 DIAGNOSIS — E118 Type 2 diabetes mellitus with unspecified complications: Secondary | ICD-10-CM

## 2013-09-07 DIAGNOSIS — R7881 Bacteremia: Secondary | ICD-10-CM

## 2013-09-07 DIAGNOSIS — N184 Chronic kidney disease, stage 4 (severe): Secondary | ICD-10-CM | POA: Diagnosis present

## 2013-09-07 DIAGNOSIS — N179 Acute kidney failure, unspecified: Secondary | ICD-10-CM | POA: Diagnosis present

## 2013-09-07 HISTORY — PX: BELOW KNEE LEG AMPUTATION: SUR23

## 2013-09-07 HISTORY — PX: AMPUTATION: SHX166

## 2013-09-07 LAB — PROTIME-INR
INR: 1.04 (ref 0.00–1.49)
Prothrombin Time: 13.6 seconds (ref 11.6–15.2)

## 2013-09-07 LAB — COMPREHENSIVE METABOLIC PANEL
ALK PHOS: 89 U/L (ref 39–117)
ALT: 16 U/L (ref 0–53)
AST: 10 U/L (ref 0–37)
Albumin: 3.1 g/dL — ABNORMAL LOW (ref 3.5–5.2)
Anion gap: 15 (ref 5–15)
BILIRUBIN TOTAL: 0.3 mg/dL (ref 0.3–1.2)
BUN: 22 mg/dL (ref 6–23)
CHLORIDE: 99 meq/L (ref 96–112)
CO2: 24 mEq/L (ref 19–32)
Calcium: 9.5 mg/dL (ref 8.4–10.5)
Creatinine, Ser: 1.36 mg/dL — ABNORMAL HIGH (ref 0.50–1.35)
GFR calc non Af Amer: 58 mL/min — ABNORMAL LOW (ref >90)
GFR, EST AFRICAN AMERICAN: 67 mL/min — AB (ref >90)
GLUCOSE: 291 mg/dL — AB (ref 70–99)
POTASSIUM: 4.6 meq/L (ref 3.7–5.3)
Sodium: 138 mEq/L (ref 137–147)
TOTAL PROTEIN: 7.7 g/dL (ref 6.0–8.3)

## 2013-09-07 LAB — GLUCOSE, CAPILLARY
GLUCOSE-CAPILLARY: 265 mg/dL — AB (ref 70–99)
GLUCOSE-CAPILLARY: 215 mg/dL — AB (ref 70–99)
Glucose-Capillary: 247 mg/dL — ABNORMAL HIGH (ref 70–99)
Glucose-Capillary: 134 mg/dL — ABNORMAL HIGH (ref 70–99)

## 2013-09-07 LAB — CBC
HCT: 31.9 % — ABNORMAL LOW (ref 39.0–52.0)
HEMOGLOBIN: 10 g/dL — AB (ref 13.0–17.0)
MCH: 26 pg (ref 26.0–34.0)
MCHC: 31.3 g/dL (ref 30.0–36.0)
MCV: 83.1 fL (ref 78.0–100.0)
Platelets: 326 10*3/uL (ref 150–400)
RBC: 3.84 MIL/uL — ABNORMAL LOW (ref 4.22–5.81)
RDW: 14.4 % (ref 11.5–15.5)
WBC: 9.3 10*3/uL (ref 4.0–10.5)

## 2013-09-07 LAB — APTT: APTT: 41 s — AB (ref 24–37)

## 2013-09-07 SURGERY — AMPUTATION BELOW KNEE
Anesthesia: General | Site: Leg Lower | Laterality: Right

## 2013-09-07 MED ORDER — DOCUSATE SODIUM 100 MG PO CAPS
100.0000 mg | ORAL_CAPSULE | Freq: Two times a day (BID) | ORAL | Status: DC
  Administered 2013-09-07 – 2013-09-11 (×9): 100 mg via ORAL
  Filled 2013-09-07 (×13): qty 1

## 2013-09-07 MED ORDER — PROPOFOL 10 MG/ML IV BOLUS
INTRAVENOUS | Status: DC | PRN
  Administered 2013-09-07: 200 mg via INTRAVENOUS

## 2013-09-07 MED ORDER — HYDROMORPHONE HCL PF 1 MG/ML IJ SOLN: 0.2500 mg | INTRAMUSCULAR | Status: DC | PRN

## 2013-09-07 MED ORDER — INSULIN GLARGINE 100 UNIT/ML ~~LOC~~ SOLN
70.0000 [IU] | Freq: Every day | SUBCUTANEOUS | Status: DC
  Administered 2013-09-07 – 2013-09-11 (×5): 70 [IU] via SUBCUTANEOUS
  Filled 2013-09-07 (×6): qty 0.7

## 2013-09-07 MED ORDER — ASPIRIN EC 325 MG PO TBEC
325.0000 mg | DELAYED_RELEASE_TABLET | Freq: Every day | ORAL | Status: DC
  Administered 2013-09-07 – 2013-09-13 (×6): 325 mg via ORAL
  Filled 2013-09-07 (×8): qty 1

## 2013-09-07 MED ORDER — ATORVASTATIN CALCIUM 10 MG PO TABS
10.0000 mg | ORAL_TABLET | Freq: Every day | ORAL | Status: DC
  Administered 2013-09-07 – 2013-09-11 (×5): 10 mg via ORAL
  Filled 2013-09-07 (×6): qty 1

## 2013-09-07 MED ORDER — ONDANSETRON HCL 4 MG/2ML IJ SOLN
INTRAMUSCULAR | Status: DC | PRN
  Administered 2013-09-07: 4 mg via INTRAVENOUS

## 2013-09-07 MED ORDER — LIDOCAINE HCL (CARDIAC) 20 MG/ML IV SOLN
INTRAVENOUS | Status: DC | PRN
  Administered 2013-09-07: 80 mg via INTRAVENOUS

## 2013-09-07 MED ORDER — INSULIN ASPART 100 UNIT/ML ~~LOC~~ SOLN
SUBCUTANEOUS | Status: AC
  Administered 2013-09-07: 8 [IU] via SUBCUTANEOUS
  Filled 2013-09-07: qty 1

## 2013-09-07 MED ORDER — OXYCODONE-ACETAMINOPHEN 5-325 MG PO TABS: 1.0000 | ORAL_TABLET | ORAL | Status: DC | PRN

## 2013-09-07 MED ORDER — METFORMIN HCL 500 MG PO TABS
1000.0000 mg | ORAL_TABLET | Freq: Two times a day (BID) | ORAL | Status: DC
  Administered 2013-09-08 – 2013-09-11 (×7): 1000 mg via ORAL
  Filled 2013-09-07 (×10): qty 2

## 2013-09-07 MED ORDER — LACTATED RINGERS IV SOLN
INTRAVENOUS | Status: DC
  Administered 2013-09-07 (×2): via INTRAVENOUS

## 2013-09-07 MED ORDER — HYDROMORPHONE HCL PF 1 MG/ML IJ SOLN
INTRAMUSCULAR | Status: AC
  Filled 2013-09-07: qty 1

## 2013-09-07 MED ORDER — MIDAZOLAM HCL 2 MG/2ML IJ SOLN
INTRAMUSCULAR | Status: AC
  Filled 2013-09-07: qty 2

## 2013-09-07 MED ORDER — AMLODIPINE BESYLATE 5 MG PO TABS
5.0000 mg | ORAL_TABLET | Freq: Every day | ORAL | Status: DC
  Administered 2013-09-07 – 2013-09-11 (×5): 5 mg via ORAL
  Filled 2013-09-07 (×6): qty 1

## 2013-09-07 MED ORDER — PHENYLEPHRINE HCL 10 MG/ML IJ SOLN
INTRAMUSCULAR | Status: DC | PRN
  Administered 2013-09-07: 80 ug via INTRAVENOUS

## 2013-09-07 MED ORDER — CEFAZOLIN SODIUM-DEXTROSE 2-3 GM-% IV SOLR
2.0000 g | INTRAVENOUS | Status: AC
  Administered 2013-09-07: 2 g via INTRAVENOUS

## 2013-09-07 MED ORDER — MEPERIDINE HCL 25 MG/ML IJ SOLN
6.2500 mg | INTRAMUSCULAR | Status: DC | PRN
Start: 2013-09-07 — End: 2013-09-07

## 2013-09-07 MED ORDER — SENNOSIDES-DOCUSATE SODIUM 8.6-50 MG PO TABS
1.0000 | ORAL_TABLET | Freq: Every evening | ORAL | Status: DC | PRN
  Filled 2013-09-07: qty 1

## 2013-09-07 MED ORDER — GABAPENTIN 300 MG PO CAPS
600.0000 mg | ORAL_CAPSULE | Freq: Three times a day (TID) | ORAL | Status: DC
  Administered 2013-09-07 – 2013-09-11 (×13): 600 mg via ORAL
  Filled 2013-09-07 (×16): qty 2

## 2013-09-07 MED ORDER — OXYCODONE HCL 5 MG/5ML PO SOLN
5.0000 mg | Freq: Once | ORAL | Status: DC | PRN
Start: 2013-09-07 — End: 2013-09-07

## 2013-09-07 MED ORDER — CEFAZOLIN SODIUM 1-5 GM-% IV SOLN
1.0000 g | Freq: Four times a day (QID) | INTRAVENOUS | Status: AC
  Administered 2013-09-07 – 2013-09-08 (×3): 1 g via INTRAVENOUS
  Filled 2013-09-07 (×3): qty 50

## 2013-09-07 MED ORDER — ONDANSETRON HCL 4 MG/2ML IJ SOLN
4.0000 mg | Freq: Four times a day (QID) | INTRAMUSCULAR | Status: DC | PRN
  Administered 2013-09-10 – 2013-09-13 (×2): 4 mg via INTRAVENOUS
  Filled 2013-09-07 (×2): qty 2

## 2013-09-07 MED ORDER — METOCLOPRAMIDE HCL 10 MG PO TABS
5.0000 mg | ORAL_TABLET | Freq: Three times a day (TID) | ORAL | Status: DC | PRN
  Administered 2013-09-11: 10 mg via ORAL
  Filled 2013-09-07: qty 1

## 2013-09-07 MED ORDER — PROPOFOL 10 MG/ML IV BOLUS
INTRAVENOUS | Status: AC
  Filled 2013-09-07: qty 20

## 2013-09-07 MED ORDER — FENTANYL CITRATE 0.05 MG/ML IJ SOLN
INTRAMUSCULAR | Status: AC
  Filled 2013-09-07: qty 5

## 2013-09-07 MED ORDER — FUROSEMIDE 40 MG PO TABS
40.0000 mg | ORAL_TABLET | Freq: Every day | ORAL | Status: DC
  Administered 2013-09-07 – 2013-09-11 (×5): 40 mg via ORAL
  Filled 2013-09-07 (×5): qty 1

## 2013-09-07 MED ORDER — METOCLOPRAMIDE HCL 5 MG/ML IJ SOLN
5.0000 mg | Freq: Three times a day (TID) | INTRAMUSCULAR | Status: DC | PRN
  Administered 2013-09-12: 10 mg via INTRAVENOUS
  Filled 2013-09-07 (×2): qty 2

## 2013-09-07 MED ORDER — BISACODYL 5 MG PO TBEC
5.0000 mg | DELAYED_RELEASE_TABLET | Freq: Every day | ORAL | Status: DC | PRN
  Administered 2013-09-10 – 2013-09-11 (×2): 5 mg via ORAL
  Filled 2013-09-07 (×2): qty 1

## 2013-09-07 MED ORDER — MAGNESIUM CITRATE PO SOLN: 1.0000 | Freq: Once | ORAL | Status: AC | PRN

## 2013-09-07 MED ORDER — INSULIN ASPART 100 UNIT/ML ~~LOC~~ SOLN
4.0000 [IU] | Freq: Three times a day (TID) | SUBCUTANEOUS | Status: DC
  Administered 2013-09-08 – 2013-09-11 (×11): 4 [IU] via SUBCUTANEOUS

## 2013-09-07 MED ORDER — OXYCODONE HCL 5 MG PO TABS
ORAL_TABLET | ORAL | Status: AC
  Filled 2013-09-07: qty 1

## 2013-09-07 MED ORDER — ONDANSETRON HCL 4 MG PO TABS
4.0000 mg | ORAL_TABLET | Freq: Four times a day (QID) | ORAL | Status: DC | PRN
  Administered 2013-09-11: 4 mg via ORAL
  Filled 2013-09-07: qty 1

## 2013-09-07 MED ORDER — INSULIN ASPART 100 UNIT/ML ~~LOC~~ SOLN
0.0000 [IU] | Freq: Three times a day (TID) | SUBCUTANEOUS | Status: DC
  Administered 2013-09-08: 3 [IU] via SUBCUTANEOUS
  Administered 2013-09-08 (×2): 8 [IU] via SUBCUTANEOUS
  Administered 2013-09-09 (×2): 5 [IU] via SUBCUTANEOUS
  Administered 2013-09-09: 2 [IU] via SUBCUTANEOUS
  Administered 2013-09-10: 3 [IU] via SUBCUTANEOUS
  Administered 2013-09-10: 2 [IU] via SUBCUTANEOUS
  Administered 2013-09-11: 5 [IU] via SUBCUTANEOUS
  Administered 2013-09-11: 2 [IU] via SUBCUTANEOUS
  Administered 2013-09-11: 3 [IU] via SUBCUTANEOUS

## 2013-09-07 MED ORDER — HYDROMORPHONE HCL PF 1 MG/ML IJ SOLN
0.5000 mg | INTRAMUSCULAR | Status: DC | PRN
  Administered 2013-09-07 – 2013-09-10 (×13): 1 mg via INTRAVENOUS
  Filled 2013-09-07 (×14): qty 1

## 2013-09-07 MED ORDER — CARVEDILOL 3.125 MG PO TABS
3.1250 mg | ORAL_TABLET | Freq: Once | ORAL | Status: AC
  Administered 2013-09-07: 3.125 mg via ORAL

## 2013-09-07 MED ORDER — SODIUM CHLORIDE 0.9 % IV SOLN
INTRAVENOUS | Status: DC
  Administered 2013-09-07 – 2013-09-12 (×2): via INTRAVENOUS

## 2013-09-07 MED ORDER — METHOCARBAMOL 500 MG PO TABS
ORAL_TABLET | ORAL | Status: AC
  Filled 2013-09-07: qty 1

## 2013-09-07 MED ORDER — DIPHENHYDRAMINE HCL 12.5 MG/5ML PO ELIX
12.5000 mg | ORAL_SOLUTION | ORAL | Status: DC | PRN
  Filled 2013-09-07: qty 10

## 2013-09-07 MED ORDER — FENTANYL CITRATE 0.05 MG/ML IJ SOLN
INTRAMUSCULAR | Status: DC | PRN
  Administered 2013-09-07 (×6): 25 ug via INTRAVENOUS

## 2013-09-07 MED ORDER — OXYCODONE-ACETAMINOPHEN 5-325 MG PO TABS
1.0000 | ORAL_TABLET | ORAL | Status: DC | PRN
  Administered 2013-09-07 – 2013-09-11 (×9): 2 via ORAL
  Filled 2013-09-07 (×9): qty 2

## 2013-09-07 MED ORDER — METHOCARBAMOL 1000 MG/10ML IJ SOLN: 500.0000 mg | Freq: Four times a day (QID) | INTRAMUSCULAR | Status: DC | PRN

## 2013-09-07 MED ORDER — CARVEDILOL 3.125 MG PO TABS
3.1250 mg | ORAL_TABLET | Freq: Two times a day (BID) | ORAL | Status: DC
  Administered 2013-09-08 – 2013-09-11 (×8): 3.125 mg via ORAL
  Filled 2013-09-07 (×12): qty 1

## 2013-09-07 MED ORDER — METHOCARBAMOL 500 MG PO TABS
500.0000 mg | ORAL_TABLET | Freq: Four times a day (QID) | ORAL | Status: DC | PRN
  Administered 2013-09-07 – 2013-09-11 (×10): 500 mg via ORAL
  Filled 2013-09-07 (×9): qty 1

## 2013-09-07 MED ORDER — CARVEDILOL 3.125 MG PO TABS
ORAL_TABLET | ORAL | Status: AC
  Filled 2013-09-07: qty 1

## 2013-09-07 MED ORDER — OXYCODONE HCL 5 MG PO TABS
5.0000 mg | ORAL_TABLET | Freq: Once | ORAL | Status: DC | PRN
  Administered 2013-09-07: 5 mg via ORAL

## 2013-09-07 SURGICAL SUPPLY — 44 items
BANDAGE ESMARK 6X9 LF (GAUZE/BANDAGES/DRESSINGS) ×1 IMPLANT
BLADE SAW RECIP 87.9 MT (BLADE) ×2 IMPLANT
BLADE SURG 21 STRL SS (BLADE) ×2 IMPLANT
BNDG COHESIVE 6X5 TAN STRL LF (GAUZE/BANDAGES/DRESSINGS) ×2 IMPLANT
BNDG ESMARK 6X9 LF (GAUZE/BANDAGES/DRESSINGS) ×2
BNDG GAUZE ELAST 4 BULKY (GAUZE/BANDAGES/DRESSINGS) ×2 IMPLANT
COVER SURGICAL LIGHT HANDLE (MISCELLANEOUS) ×2 IMPLANT
CUFF TOURNIQUET SINGLE 34IN LL (TOURNIQUET CUFF) IMPLANT
CUFF TOURNIQUET SINGLE 44IN (TOURNIQUET CUFF) IMPLANT
DRAIN PENROSE 1/2X12 LTX STRL (WOUND CARE) IMPLANT
DRAPE EXTREMITY T 121X128X90 (DRAPE) ×2 IMPLANT
DRAPE PROXIMA HALF (DRAPES) ×4 IMPLANT
DRAPE U-SHAPE 47X51 STRL (DRAPES) ×4 IMPLANT
DRSG ADAPTIC 3X8 NADH LF (GAUZE/BANDAGES/DRESSINGS) ×2 IMPLANT
DRSG PAD ABDOMINAL 8X10 ST (GAUZE/BANDAGES/DRESSINGS) ×2 IMPLANT
DURAPREP 26ML APPLICATOR (WOUND CARE) ×2 IMPLANT
ELECT REM PT RETURN 9FT ADLT (ELECTROSURGICAL) ×2
ELECTRODE REM PT RTRN 9FT ADLT (ELECTROSURGICAL) ×1 IMPLANT
GAUZE SPONGE 4X4 12PLY STRL (GAUZE/BANDAGES/DRESSINGS) ×2 IMPLANT
GLOVE BIOGEL PI IND STRL 9 (GLOVE) ×1 IMPLANT
GLOVE BIOGEL PI INDICATOR 9 (GLOVE) ×1
GLOVE SURG ORTHO 9.0 STRL STRW (GLOVE) ×2 IMPLANT
GOWN STRL REUS W/ TWL XL LVL3 (GOWN DISPOSABLE) ×2 IMPLANT
GOWN STRL REUS W/TWL XL LVL3 (GOWN DISPOSABLE) ×2
KIT BASIN OR (CUSTOM PROCEDURE TRAY) ×2 IMPLANT
KIT ROOM TURNOVER OR (KITS) ×2 IMPLANT
MANIFOLD NEPTUNE II (INSTRUMENTS) ×2 IMPLANT
NS IRRIG 1000ML POUR BTL (IV SOLUTION) ×2 IMPLANT
PACK GENERAL/GYN (CUSTOM PROCEDURE TRAY) ×2 IMPLANT
PAD ARMBOARD 7.5X6 YLW CONV (MISCELLANEOUS) ×4 IMPLANT
SPONGE GAUZE 4X4 12PLY STER LF (GAUZE/BANDAGES/DRESSINGS) ×2 IMPLANT
SPONGE LAP 18X18 X RAY DECT (DISPOSABLE) IMPLANT
STAPLER VISISTAT 35W (STAPLE) IMPLANT
STOCKINETTE IMPERVIOUS LG (DRAPES) ×2 IMPLANT
SUT PDS AB 1 CT  36 (SUTURE)
SUT PDS AB 1 CT 36 (SUTURE) IMPLANT
SUT SILK 2 0 (SUTURE) ×1
SUT SILK 2-0 18XBRD TIE 12 (SUTURE) ×1 IMPLANT
SUT VIC AB 1 CT1 27 (SUTURE) ×2
SUT VIC AB 1 CT1 27XBRD ANBCTR (SUTURE) ×2 IMPLANT
TOWEL OR 17X24 6PK STRL BLUE (TOWEL DISPOSABLE) ×2 IMPLANT
TOWEL OR 17X26 10 PK STRL BLUE (TOWEL DISPOSABLE) ×2 IMPLANT
TUBE ANAEROBIC SPECIMEN COL (MISCELLANEOUS) IMPLANT
WATER STERILE IRR 1000ML POUR (IV SOLUTION) ×2 IMPLANT

## 2013-09-07 NOTE — Anesthesia Postprocedure Evaluation (Signed)
  Anesthesia Post-op Note  Patient: [name]  Procedure(s) Performed: Procedure(s): Right Below Knee Amputation (Right)  Patient Location: PACU  Anesthesia Type:General  Level of Consciousness: awake and alert   Airway and Oxygen Therapy: Patient Spontanous Breathing  Post-op Pain: moderate  Post-op Assessment: Post-op Vital signs reviewed, Patient's Cardiovascular Status Stable, Respiratory Function Stable, Patent Airway, No signs of Nausea or vomiting and Pain level controlled  Post-op Vital Signs: Reviewed and stable  Last Vitals:  Filed Vitals:   09/07/13 2022  BP: 186/84  Pulse: 95  Temp: 36.7 C  Resp: 20    Complications: No apparent anesthesia complications

## 2013-09-07 NOTE — Transfer of Care (Signed)
Immediate Anesthesia Transfer of Care Note  Patient: [name]  Procedure(s) Performed: Procedure(s): Right Below Knee Amputation (Right)  Patient Location: PACU  Anesthesia Type:General  Level of Consciousness: awake, alert  and oriented  Airway & Oxygen Therapy: Patient Spontanous Breathing  Post-op Assessment: Report given to PACU RN  Post vital signs: Reviewed and stable  Complications: No apparent anesthesia complications

## 2013-09-07 NOTE — Progress Notes (Signed)
Call to Dr. Tamala Julian, G., reviewed pt. Last chest xray.  Pt. Denies cough but states his chest still doesn't feel quite back to a healthy state.  Pt. States that he wasn't ever given results of the CXR done a week ago. Per order from Dr. Tamala Julian will do 2V CXR today.

## 2013-09-07 NOTE — Progress Notes (Signed)
2 bags of valuables sent to PACU labeled. Wallet sent to security via B. Vira Agar, RN

## 2013-09-07 NOTE — Anesthesia Preprocedure Evaluation (Signed)
Anesthesia Evaluation  Patient identified by MRN, date of birth, ID band Patient awake    Reviewed: Allergy & Precautions, H&P , NPO status , Patient's Chart, lab work & pertinent test results  Airway Mallampati: II TM Distance: >3 FB Neck ROM: Full    Dental   Pulmonary former smoker,          Cardiovascular hypertension, Pt. on medications     Neuro/Psych    GI/Hepatic   Endo/Other  diabetes  Renal/GU      Musculoskeletal   Abdominal   Peds  Hematology   Anesthesia Other Findings   Reproductive/Obstetrics                           Anesthesia Physical Anesthesia Plan  ASA: II  Anesthesia Plan: General   Post-op Pain Management:    Induction: Intravenous  Airway Management Planned: LMA  Additional Equipment:   Intra-op Plan:   Post-operative Plan: Extubation in OR  Informed Consent: I have reviewed the patients History and Physical, chart, labs and discussed the procedure including the risks, benefits and alternatives for the proposed anesthesia with the patient or authorized representative who has indicated his/her understanding and acceptance.   Dental advisory given  Plan Discussed with: Surgeon and Anesthesiologist  Anesthesia Plan Comments:         Anesthesia Quick Evaluation
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
## 2013-09-07 NOTE — Op Note (Signed)
09/07/2013  6:18 PM  PATIENT:  [name]    PRE-OPERATIVE DIAGNOSIS:  Gangrene Right Foot  POST-OPERATIVE DIAGNOSIS:  Same  PROCEDURE:  Right Below Knee Amputation  SURGEON:  [name], MD  PHYSICIAN ASSISTANT:None ANESTHESIA:   General  PREOPERATIVE INDICATIONS:  [name] is a  53 y.o. male with a diagnosis of Gangrene Right Foot who failed conservative measures and elected for surgical management.    The risks benefits and alternatives were discussed with the patient preoperatively including but not limited to the risks of infection, bleeding, nerve injury, cardiopulmonary complications, the need for revision surgery, among others, and the patient was willing to proceed.  OPERATIVE IMPLANTS: none  OPERATIVE FINDINGS: good muscle   OPERATIVE PROCEDURE: Patient is a 53 year old gentleman with gangrenous changes to the right foot. He has failed conservative care and presents at this time for transtibial amputation. Risks and benefits were discussed including risk for higher level amputation. Patient states he understands and wished to proceed at this time. Description of procedure patient was brought to the operating room and underwent a general anesthetic. After adequate levels and anesthesia obtained patient's right lower extremity was prepped using DuraPrep draped into a sterile field the foot was draped out of sterile field with an impervious stockinette. A transverse incision was made 11 cm distal to the tibial tubercle this curved proximally and a large posterior flap was created. The bone was transected just proximal the skin incision. This knife was used to create a large posterior flap. The sciatic nerve was pulled cut and allowed to retract. The vascular bundles were suture ligated with 2-0 silk. The deep and superficial fascial layers were closed using #1 Vicryl. The skin was closed using staples. A compressive dressing was applied. Patient was extubated taken to the PACU in  stable condition.

## 2013-09-08 LAB — GLUCOSE, CAPILLARY
GLUCOSE-CAPILLARY: 257 mg/dL — AB (ref 70–99)
GLUCOSE-CAPILLARY: 270 mg/dL — AB (ref 70–99)
Glucose-Capillary: 194 mg/dL — ABNORMAL HIGH (ref 70–99)
Glucose-Capillary: 180 mg/dL — ABNORMAL HIGH (ref 70–99)

## 2013-09-08 NOTE — Progress Notes (Signed)
Subjective: 1 Day Post-Op Procedure(s) (LRB): Right Below Knee Amputation (Right) Patient reports pain as 7 on 0-10 scale.    Objective: Vital signs in last 24 hours: Temp:  [97.2 F (36.2 C)-98.6 F (37 C)] 98.3 F (36.8 C) (09/05 0600) Pulse Rate:  [86-105] 96 (09/05 0600) Resp:  [14-20] 18 (09/05 0600) BP: (109-186)/(57-90) 157/74 mmHg (09/05 0600) SpO2:  [99 %-100 %] 100 % (09/05 0600) Weight:  [82.101 kg (181 lb)-87.317 kg (192 lb 8 oz)] 82.101 kg (181 lb) (09/04 1910)  Intake/Output from previous day: 09/04 0701 - 09/05 0700 In: 1360 [P.O.:360; I.V.:1000] Out: 1400 [Urine:1400] Intake/Output this shift:     Recent Labs  09/07/13 1513  HGB 10.0*    Recent Labs  09/07/13 1513  WBC 9.3  RBC 3.84*  HCT 31.9*  PLT 326    Recent Labs  09/07/13 1513  NA 138  K 4.6  CL 99  CO2 24  BUN 22  CREATININE 1.36*  GLUCOSE 291*  CALCIUM 9.5    Recent Labs  09/07/13 1513  INR 1.04    dressing intact  Assessment/Plan: 1 Day Post-Op Procedure(s) (LRB): Right Below Knee Amputation (Right) Up with therapy  Krystle Oberman C 09/08/2013, 10:36 AM

## 2013-09-08 NOTE — Progress Notes (Signed)
OT Cancellation Note  Patient Details Name: [name] MRN: [id number] DOB: 28-Aug-1960   Cancelled Treatment:    Reason Eval/Treat Not Completed: Pain limiting ability to participate .   Juluis Rainier [id number] 09/08/2013, 5:37 PM

## 2013-09-08 NOTE — Progress Notes (Signed)
Patient alert and oriented, unable to work with PT due to pain, doesn't not ask for pain meds until last minute, patient educated to communicate with staff so we we alter his pain medication, iv antibiotics

## 2013-09-08 NOTE — Progress Notes (Signed)
PT Cancellation Note  Patient Details Name: [name] MRN: [id number] DOB: 1960/10/06   Cancelled Treatment:    Reason Eval/Treat Not Completed: Pain limiting ability to participate   Shanna Cisco 09/08/2013, 4:30 PM

## 2013-09-08 NOTE — Progress Notes (Signed)
Pt says he forgot momentarily about his rt BKA surgery and attempted to stand by himself to urinate, fell  To the  floor on his rt shoulder and side, no injuries, did not hit his stump. VSS. MD on- call advised and notifications made. Pt did not want his family advised since no injuries.

## 2013-09-09 LAB — GLUCOSE, CAPILLARY
GLUCOSE-CAPILLARY: 203 mg/dL — AB (ref 70–99)
Glucose-Capillary: 203 mg/dL — ABNORMAL HIGH (ref 70–99)
Glucose-Capillary: 148 mg/dL — ABNORMAL HIGH (ref 70–99)
Glucose-Capillary: 181 mg/dL — ABNORMAL HIGH (ref 70–99)

## 2013-09-09 LAB — HEMOGLOBIN A1C
Hgb A1c MFr Bld: 9.2 % — ABNORMAL HIGH (ref <5.7)
Mean Plasma Glucose: 217 mg/dL — ABNORMAL HIGH (ref <117)

## 2013-09-09 MED ORDER — VANCOMYCIN 50 MG/ML ORAL SOLUTION: 125.0000 mg | Freq: Four times a day (QID) | ORAL | Status: DC

## 2013-09-09 NOTE — Progress Notes (Signed)
Patient is adamantly refusing bed alarm even with recent fall.  Patient stated that he will call for assistance and not try to get up on his own.

## 2013-09-09 NOTE — Evaluation (Signed)
Physical Therapy Evaluation Patient Details Name: [name] MRN: [id number] DOB: [birth year] Today's Date: 09/09/2013   History of Present Illness  [name] is a 53 y.o. 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 Pt has previous Lt midfoot amputation. 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 PMH of HTN, osteomyelitis, diabetic neuropathy, PAD/PVD, DM, and CHF.   Clinical Impression  Patient is seen following the above procedure, presents with functional limitations due to the deficits listed below (see PT Problem List). Motivated to work with therapy but limited ambulatory distance secondary to pain today. Reviewed precautions and safe mobility techniques post amputation. Patient will benefit from skilled PT to increase their independence and safety with mobility to allow discharge to the venue listed below.       Follow Up Recommendations Home health PT;Supervision - Intermittent    Equipment Recommendations  3in1 (PT)    Recommendations for Other Services       Precautions / Restrictions Precautions Precautions: Other (comment);Fall Precaution Comments: educated pt on no pillow behind knee Restrictions Weight Bearing Restrictions: Yes RLE Weight Bearing: Non weight bearing      Mobility  Bed Mobility Overal bed mobility: Modified Independent                Transfers Overall transfer level: Needs assistance Equipment used: Rolling walker (2 wheeled) Transfers: Sit to/from Stand Sit to Stand: Min assist         General transfer comment: Min assist to stabilize rolling walker. VC for hand placement. Good strength and stability with RW upon standing.  Ambulation/Gait Ambulation/Gait assistance: Min guard Ambulation Distance (Feet): 20 Feet Assistive device: Rolling walker (2 wheeled) Gait Pattern/deviations:  ("hop-to" pattern)   Gait velocity interpretation: Below normal speed for age/gender General Gait Details: Close  guard for safety. Some instability as pt fatigued but was able to self support with use of rolling walker. Educated on safe DME use and appropriate technique for smooth step with LLE to prevent over-stepping with walker.  Stairs            Wheelchair Mobility    Modified Rankin (Stroke Patients Only)       Balance Overall balance assessment: Needs assistance Sitting-balance support: No upper extremity supported;Feet supported Sitting balance-Leahy Scale: Good     Standing balance support: Bilateral upper extremity supported Standing balance-Leahy Scale: Poor                               Pertinent Vitals/Pain Pain Assessment: 0-10 Pain Score: 7  Pain Location: Rt residual limb Pain Descriptors / Indicators: Aching;Burning;Throbbing;Tender Pain Intervention(s): Limited activity within patient's tolerance;Monitored during session;Repositioned    Home Living Family/patient expects to be discharged to:: Private residence Living Arrangements: Alone Available Help at Discharge: Family;Available PRN/intermittently Type of Home: House Home Access: Stairs to enter Entrance Stairs-Rails: Left Entrance Stairs-Number of Steps: 1 small step Home Layout: Two level;1/2 bath on main level;Bed/bath upstairs Home Equipment: Wheelchair - Water quality scientist - 2 wheels Additional Comments: Son stay with pt at d/c - daughter available intermittently    Prior Function Level of Independence: Independent with assistive device(s)         Comments: Pt was using RW for safety with ambulation. He was independent with ADLs.     Hand Dominance   Dominant Hand: Right    Extremity/Trunk Assessment   Upper Extremity Assessment: Defer to OT evaluation  Lower Extremity Assessment: RLE deficits/detail;LLE deficits/detail RLE Deficits / Details: Rt transtibial amputation. Decreased strength and ROM as expected post op - highly sensitive to pain LLE Deficits /  Details: Hx of left transtarsal amputation. Overall [facility] for ambulating  Cervical / Trunk Assessment: Normal  Communication   Communication: No difficulties  Cognition Arousal/Alertness: Awake/alert Behavior During Therapy: WFL for tasks assessed/performed Overall Cognitive Status: Within Functional Limits for tasks assessed                      General Comments General comments (skin integrity, edema, etc.): Pt became tearful from pain after ambulating. RN aware, administered medication for pain. Educated on positioning and light touch of residual limb for phantom pain.    Exercises Amputee Exercises Quad Sets: AROM;Right;10 reps;Seated Hip Extension: Right;AROM;10 reps;Standing Hip Flexion/Marching: AROM;Right;10 reps;Standing Knee Flexion: AROM;Right;10 reps;Seated Knee Extension: AROM;Right;10 reps;Seated      Assessment/Plan    PT Assessment Patient needs continued PT services  PT Diagnosis Difficulty walking;Abnormality of gait;Acute pain   PT Problem List Decreased strength;Decreased range of motion;Decreased activity tolerance;Decreased balance;Decreased mobility;Decreased knowledge of use of DME;Decreased safety awareness;Decreased knowledge of precautions;Pain  PT Treatment Interventions DME instruction;Gait training;Stair training;Functional mobility training;Therapeutic activities;Therapeutic exercise;Balance training;Neuromuscular re-education;Patient/family education;Modalities   PT Goals (Current goals can be found in the Care Plan section) Acute Rehab PT Goals Patient Stated Goal: Go home PT Goal Formulation: With patient Time For Goal Achievement: 09/16/13 Potential to Achieve Goals: Good    Frequency Min 3X/week   Barriers to discharge Decreased caregiver support pt lives alone    Co-evaluation               End of Session   Activity Tolerance: Patient limited by pain Patient left: in bed;with call bell/phone within reach Nurse  Communication: Mobility status         Time: [id number] PT Time Calculation (min): 39 min   Charges:   PT Evaluation $Initial PT Evaluation Tier I: 1 Procedure PT Treatments $Therapeutic Activity: 8-22 mins $Self Care/Home Management: 8-22   PT G Codes:        [name], [name]   [name] 09/09/2013, 12:35 PM

## 2013-09-09 NOTE — Progress Notes (Addendum)
Subjective: 2 Days Post-Op Procedure(s) (LRB): Right Below Knee Amputation (Right) Patient reports pain as moderate.    Objective: Vital signs in last 24 hours: Temp:  [98.4 F (36.9 C)-98.8 F (37.1 C)] 98.8 F (37.1 C) (09/06 0602) Pulse Rate:  [96-103] 96 (09/06 0602) Resp:  [16-18] 18 (09/06 0602) BP: (121-165)/(66-83) 121/66 mmHg (09/06 0602) SpO2:  [96 %-99 %] 96 % (09/06 0602)  Intake/Output from previous day: 09/05 0701 - 09/06 0700 In: 240 [P.O.:240] Out: 1425 [Urine:1425] Intake/Output this shift:     Recent Labs  09/07/13 1513  HGB 10.0*    Recent Labs  09/07/13 1513  WBC 9.3  RBC 3.84*  HCT 31.9*  PLT 326    Recent Labs  09/07/13 1513  NA 138  K 4.6  CL 99  CO2 24  BUN 22  CREATININE 1.36*  GLUCOSE 291*  CALCIUM 9.5    Recent Labs  09/07/13 1513  INR 1.04    dressing dry.   Assessment/Plan: 2 Days Post-Op Procedure(s) (LRB): Right Below Knee Amputation (Right) Plan  Home once pain controlled.  Lives alone.  Has son age 51 that he states could stay with him.  YATES,MARK C 09/09/2013, 10:14 AM

## 2013-09-09 NOTE — Progress Notes (Signed)
Occupational Therapy Evaluation Patient Details Name: [name] MRN: [id number] DOB: Jul 18, 1960 Today's Date: 09/09/2013    History of Present Illness [name] is a 53 y.o. 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 Pt has previous Lt midfoot amputation. 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 PMH of HTN, osteomyelitis, diabetic neuropathy, PAD/PVD, DM, and CHF.    Clinical Impression   PTA pt lived at home and was independent with use of RW for ADLs and functional mobility. Pt is highly motivated to return to independence. Pt required min (A) to stand from lowest bed setting and was able to ambulate to bathroom for toileting, however is limited by pain and deconditioning. Pt would benefit from skilled OT to increase functional mobility and LB ADLs to mod I level prior to return home.     Follow Up Recommendations  Home health OT;Supervision/Assistance - 24 hour;Other (comment) (HHRN (wound care))    Equipment Recommendations  Tub/shower bench;Other (comment) (AE (reacher))    Recommendations for Other Services       Precautions / Restrictions Precautions Precautions: Other (comment);Fall Precaution Comments: educated pt on no pillow behind knee Restrictions Weight Bearing Restrictions: Yes RLE Weight Bearing: Non weight bearing      Mobility Bed Mobility Overal bed mobility: Modified Independent                Transfers Overall transfer level: Needs assistance Equipment used: Rolling walker (2 wheeled) Transfers: Sit to/from Stand Sit to Stand: Min assist         General transfer comment: Min (A) to stabilize to transition to full standing from lowest bed setting. Pt with improved sit<>stand from toilet with use of grab bar. Encouraged pt to wear Lt shoe when OOB/ambulating to increase support and balance due to Lt mid foot amputation.         ADL Overall ADL's : Needs assistance/impaired Eating/Feeding:  Independent;Sitting   Grooming: Min guard;Standing;Wash/dry hands;Wash/dry face   Upper Body Bathing: Set up;Sitting   Lower Body Bathing: Minimal assistance;Sit to/from stand   Upper Body Dressing : Set up;Sitting   Lower Body Dressing: Minimal assistance;Sit to/from stand Lower Body Dressing Details (indicate cue type and reason): Pt able to don Lt shoe for ambulation.  Toilet Transfer: Minimal assistance;Ambulation;Comfort height toilet;Grab bars;RW (Min (A) for sit<>stand and close min guard for ambulation)   Toileting- Clothing Manipulation and Hygiene: Sit to/from stand;Min guard   Tub/ Banker: Total assistance   Functional mobility during ADLs: Min guard;Rolling walker (close min guard for safety) General ADL Comments: Pt expressed difficulty coping with loss of Rt LE and OT provided therapeutic listening and encouragement that pt can regain independence. Educated pt on DME, adaptive equipment, and techniques to utilize to increase independence with ADLs. Pt was motivated to get OOB for toileting and required min (A) for transfers and close min guard for ambulation to and from toilet. Pt fatigued quickly and experienced severe pain while sitting on toilet and during ambulation. Returned to bed and pt tearful from pain. Repositioned in bed for comfort. Educated pt extensively on coping skills and techniques (including deep breathing, and mirror therapy) as well as fall prevention techniques. Pt lives at home alone and expressed fears that he will lose his whole leg.      Vision  Pt reports no change from baseline and has no apparent visual deficits.  Perception Perception Perception Tested?: No   Praxis Praxis Praxis tested?: Within functional limits    Pertinent Vitals/Pain Pain Assessment: 0-10 Pain Score: 7  Pain Location: Rt residual limb Pain Descriptors / Indicators: Aching;Burning;Throbbing;Tender Pain Intervention(s): Limited  activity within patient's tolerance;Monitored during session;Repositioned     Hand Dominance Right   Extremity/Trunk Assessment Upper Extremity Assessment Upper Extremity Assessment: Defer to OT evaluation   Lower Extremity Assessment Lower Extremity Assessment: Defer to PT evaluation   Cervical / Trunk Assessment Cervical / Trunk Assessment: Normal   Communication Communication Communication: No difficulties   Cognition Arousal/Alertness: Awake/alert Behavior During Therapy: WFL for tasks assessed/performed Overall Cognitive Status: Within Functional Limits for tasks assessed                     General Comments   Educated pt extensively on pain control techniques and relaxation, such as deep breathing, visualization, and mirror box therapy. Pt reports that he is having a difficult time coping with the loss of his Rt LE as he was told that the amputation would be less severe. OT provided therapeutic listening and educated pt on ways he could continue to be independent safely.             Home Living Family/patient expects to be discharged to:: Private residence Living Arrangements: Alone Available Help at Discharge: Family;Available PRN/intermittently Type of Home: House Home Access: Stairs to enter Entrance Stairs-Number of Steps: 1 small step Entrance Stairs-Rails: Left Home Layout: Two level;1/2 bath on main level;Bed/bath upstairs Alternate Level Stairs-Number of Steps: 13 Alternate Level Stairs-Rails: Left Bathroom Shower/Tub: Tub/shower unit Shower/tub characteristics: Architectural technologist: Standard Bathroom Accessibility: Yes How Accessible: Accessible via walker Home Equipment: Wheelchair - Water quality scientist - 2 wheels   Additional Comments: Son stay with pt at d/c - daughter available intermittently      Prior Functioning/Environment Level of Independence: Independent with assistive device(s)        Comments: Pt was using RW for safety  with ambulation. He was independent with ADLs.    OT Diagnosis: Generalized weakness;Acute pain   OT Problem List: Decreased strength;Decreased range of motion;Decreased activity tolerance;Impaired balance (sitting and/or standing);Decreased knowledge of use of DME or AE;Pain   OT Treatment/Interventions: Self-care/ADL training;Therapeutic exercise;Energy conservation;DME and/or AE instruction;Therapeutic activities;Patient/family education;Balance training    OT Goals(Current goals can be found in the care plan section) Acute Rehab OT Goals Patient Stated Goal: to be safe and independent OT Goal Formulation: With patient Time For Goal Achievement: 09/23/13 Potential to Achieve Goals: Good ADL Goals Pt Will Perform Grooming: with modified independence;standing Pt Will Perform Lower Body Dressing: with modified independence;sit to/from stand Pt Will Transfer to Toilet: with modified independence;ambulating;bedside commode Pt Will Perform Toileting - Clothing Manipulation and hygiene: with modified independence;sit to/from stand Pt Will Perform Tub/Shower Transfer: Tub transfer;with modified independence;tub bench;rolling walker  OT Frequency: Min 2X/week   Barriers to D/C: Decreased caregiver support  Pt lives alone and has family available to assist.           End of Session Equipment Utilized During Treatment: Gait belt;Rolling walker Nurse Communication: Mobility status  Activity Tolerance: Patient tolerated treatment well Patient left: in bed;with call bell/phone within reach   Time: 0845-1005 OT Time Calculation (min): 80 min Charges:  OT General Charges $OT Visit: 1 Procedure OT Evaluation $Initial OT Evaluation Tier I: 1 Procedure OT Treatments $Self Care/Home Management : 38-52 mins $Therapeutic Activity: 8-22 mins  Juluis Rainier [id number]  09/09/2013, 10:30 AM

## 2013-09-10 ENCOUNTER — Inpatient Hospital Stay (HOSPITAL_COMMUNITY): Payer: Medicaid Other

## 2013-09-10 LAB — GLUCOSE, CAPILLARY
Glucose-Capillary: 173 mg/dL — ABNORMAL HIGH (ref 70–99)
Glucose-Capillary: 140 mg/dL — ABNORMAL HIGH (ref 70–99)
Glucose-Capillary: 105 mg/dL — ABNORMAL HIGH (ref 70–99)
Glucose-Capillary: 150 mg/dL — ABNORMAL HIGH (ref 70–99)

## 2013-09-10 MED ORDER — FLEET ENEMA 7-19 GM/118ML RE ENEM
1.0000 | ENEMA | Freq: Every day | RECTAL | Status: DC | PRN
  Administered 2013-09-10 – 2013-09-11 (×2): 1 via RECTAL
  Filled 2013-09-10 (×3): qty 1

## 2013-09-10 MED ORDER — BISACODYL 10 MG RE SUPP
10.0000 mg | Freq: Every day | RECTAL | Status: DC | PRN
  Administered 2013-09-10: 10 mg via RECTAL
  Filled 2013-09-10: qty 1

## 2013-09-10 NOTE — Progress Notes (Signed)
Patient complaining of abdominal pain in the RLQ this am.  Says he woke up with the pain.  Denies nausea.  His abdomen is soft, slightly distended, bowel sounds positive all 4 quadrants. He says he feels like he may have to have a bowel movement.  Gave him Dulcolax tab.  Instructed him to call me if he felt like he needed to have BM.  He asks for [facility] to be left at bed so he can get up quickly,but asked him to please call for help so he would not fall.  He agrees to call for assistance. Call bell within reach.

## 2013-09-10 NOTE — Progress Notes (Signed)
Physical Therapy Treatment Patient Details Name: [name] MRN: [id number] DOB: 1960-04-06 Today's Date: 09/10/2013    History of Present Illness [name] is a 53 y.o. 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 Pt has previous Lt midfoot amputation. 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 PMH of HTN, osteomyelitis, diabetic neuropathy, PAD/PVD, DM, and CHF.     PT Comments    Patient limited in therapy session today secondary to abdominal pain. He was willing to participate in transfer training and required min assist for stability with some loss of balance while using a rolling walker. Nurse aware of pt symptoms and awaiting imaging. Patient will continue to benefit from skilled physical therapy services to further improve independence with functional mobility.   Follow Up Recommendations  Home health PT;Supervision - Intermittent     Equipment Recommendations  3in1 (PT)    Recommendations for Other Services       Precautions / Restrictions Precautions Precautions: Other (comment);Fall (amputee) Precaution Comments: educated on post op amputation precautions Restrictions Weight Bearing Restrictions: Yes RLE Weight Bearing: Non weight bearing    Mobility  Bed Mobility Overal bed mobility: Modified Independent                Transfers Overall transfer level: Needs assistance Equipment used: Rolling walker (2 wheeled) Transfers: Sit to/from [facility] Sit to Stand: Min assist Stand pivot transfers: Min assist       General transfer comment: Min assist for boost to stand with VC for hand placement. Min assist for balance with posterior loss of balance upon pivot transfer. pt cued to prevent rushing as he was attempting to sit too quickly without safely positioning feet and walker first.  Ambulation/Gait                 Stairs            Wheelchair Mobility    Modified Rankin (Stroke Patients  Only)       Balance                                    Cognition Arousal/Alertness: Awake/alert Behavior During Therapy: WFL for tasks assessed/performed Overall Cognitive Status: Within Functional Limits for tasks assessed                      Exercises      General Comments General comments (skin integrity, edema, etc.): Pt with RUQ pain tender to palpation, states this is worse than RLE pain. Sheets soaked through from perspiration. Complains of dark urine which he reports is abnormal for him at baseline.      Pertinent Vitals/Pain Pain Assessment: 0-10 Pain Score:  ("my stomach is now hurting worse than my leg") Pain Location: RUQ of abdomen Pain Intervention(s): Limited activity within patient's tolerance;Monitored during session;Repositioned    Home Living                      Prior Function            PT Goals (current goals can now be found in the care plan section) Acute Rehab PT Goals Patient Stated Goal: Go home PT Goal Formulation: With patient Time For Goal Achievement: 09/16/13 Potential to Achieve Goals: Good Progress towards PT goals: Progressing toward goals    Frequency  Min 3X/week  PT Plan Current plan remains appropriate    Co-evaluation             End of Session   Activity Tolerance: Patient limited by pain Patient left: with call bell/phone within reach;in chair     Time: 1356-1415 PT Time Calculation (min): 19 min  Charges:  $Therapeutic Activity: 8-22 mins                    G Codes:      [name], [name]  Ellouise [name] 09/10/2013, 2:36 PM

## 2013-09-10 NOTE — Progress Notes (Signed)
Patient was given a Fleet's Enema at 22:30, with no result.  He requested prune juice and he has drank that with hopes of having a bowel movement.  Bowels sounds are active, stomach is distended.

## 2013-09-10 NOTE — Progress Notes (Signed)
Patient vomited approximately 300cc undigested food at 0630.  Still complaining of discomfort in the RLQ.  Assisted patient with mouth care. He sat on the [facility] but did not have a bowel movement.  Medicated him with 4mg  IV Zofran.

## 2013-09-10 NOTE — Progress Notes (Signed)
OT Cancellation Note  Patient Details Name: [name] MRN: [id number] DOB: [birth year]   Cancelled Treatment:    Reason Eval/Treat Not Completed: Pain limiting ability to participate  Benito Mccreedy OTR/L [id number] 09/10/2013, 4:40 PM

## 2013-09-10 NOTE — Progress Notes (Signed)
PT Cancellation Note  Patient Details Name: Pearley Thresher MRN: [id number] DOB: 1960/02/14   Cancelled Treatment:    Reason Eval/Treat Not Completed: Other (comment) (Pt with nausea and abdominal pain; requests pt return later)  Pt requests physical therapy be held until a later time. States he is having severe right upper and right lower quadrant pain; worse with palpation. Will follow up as time allows.   [place], [place]  Candie Mile S 09/10/2013, 9:56 AM

## 2013-09-10 NOTE — Progress Notes (Signed)
Subjective: 3 Days Post-Op Procedure(s) (LRB): Right Below Knee Amputation (Right) Patient reports pain as 7 on 0-10 scale.  Has used a lot of pain meds, IV dilaudid and now with abdominal distention N/V.   Objective: Vital signs in last 24 hours: Temp:  [98.2 F (36.8 C)-99.5 F (37.5 C)] 98.2 F (36.8 C) (09/07 0636) Pulse Rate:  [100-114] 114 (09/07 0636) Resp:  [16-18] 18 (09/07 0636) BP: (143-156)/(64-73) 156/73 mmHg (09/07 0636) SpO2:  [95 %-99 %] 95 % (09/07 0636)  Intake/Output from previous day: 09/06 0701 - 09/07 0700 In: -  Out: 1300 [Urine:1300] Intake/Output this shift:     Recent Labs  09/07/13 1513  HGB 10.0*    Recent Labs  09/07/13 1513  WBC 9.3  RBC 3.84*  HCT 31.9*  PLT 326    Recent Labs  09/07/13 1513  NA 138  K 4.6  CL 99  CO2 24  BUN 22  CREATININE 1.36*  GLUCOSE 291*  CALCIUM 9.5    Recent Labs  09/07/13 1513  INR 1.04    Neurologically intact has abd tenderness. No rebound.   Assessment/Plan: 3 Days Post-Op Procedure(s) (LRB): Right Below Knee Amputation (Right) Plan for discharge tomorrow    Dulcolax supp. EOC if not effective. KUB. Decrease narcotics Careem Yasui C 09/10/2013, 11:08 AM

## 2013-09-11 ENCOUNTER — Encounter (HOSPITAL_COMMUNITY): Payer: Self-pay | Admitting: General Practice

## 2013-09-11 LAB — BASIC METABOLIC PANEL
Anion gap: 18 — ABNORMAL HIGH (ref 5–15)
BUN: 43 mg/dL — ABNORMAL HIGH (ref 6–23)
CALCIUM: 9.3 mg/dL (ref 8.4–10.5)
CO2: 30 mEq/L (ref 19–32)
CREATININE: 3.62 mg/dL — AB (ref 0.50–1.35)
Chloride: 88 mEq/L — ABNORMAL LOW (ref 96–112)
GFR calc Af Amer: 21 mL/min — ABNORMAL LOW (ref >90)
GFR calc non Af Amer: 18 mL/min — ABNORMAL LOW (ref >90)
Glucose, Bld: 213 mg/dL — ABNORMAL HIGH (ref 70–99)
Potassium: 5 mEq/L (ref 3.7–5.3)
Sodium: 136 mEq/L — ABNORMAL LOW (ref 137–147)

## 2013-09-11 LAB — MAGNESIUM: Magnesium: 2.6 mg/dL — ABNORMAL HIGH (ref 1.5–2.5)

## 2013-09-11 LAB — GLUCOSE, CAPILLARY
GLUCOSE-CAPILLARY: 232 mg/dL — AB (ref 70–99)
GLUCOSE-CAPILLARY: 161 mg/dL — AB (ref 70–99)
Glucose-Capillary: 194 mg/dL — ABNORMAL HIGH (ref 70–99)
Glucose-Capillary: 179 mg/dL — ABNORMAL HIGH (ref 70–99)
Glucose-Capillary: 157 mg/dL — ABNORMAL HIGH (ref 70–99)

## 2013-09-11 MED ORDER — MAGNESIUM HYDROXIDE 400 MG/5ML PO SUSP
30.0000 mL | Freq: Every day | ORAL | Status: DC | PRN
  Administered 2013-09-11: 30 mL via ORAL
  Filled 2013-09-11: qty 30

## 2013-09-11 MED ORDER — MAGNESIUM CITRATE PO SOLN
1.0000 | Freq: Once | ORAL | Status: AC
  Administered 2013-09-11: 1 via ORAL
  Filled 2013-09-11: qty 296

## 2013-09-11 MED ORDER — OXYCODONE-ACETAMINOPHEN 5-325 MG PO TABS: 1.0000 | ORAL_TABLET | ORAL | Status: DC | PRN

## 2013-09-11 NOTE — Progress Notes (Signed)
PHARMACIST - PHYSICIAN COMMUNICATION DR:  Medical team CONCERNING:  METFORMIN SAFE ADMINISTRATION POLICY  RECOMMENDATION: Metformin has been placed on DISCONTINUE (rejected order) STATUS and should be reordered only after any of the conditions below are ruled out.  Current safety recommendations include avoiding metformin for a minimum of 48 hours after the patient's exposure to intravenous contrast media.  DESCRIPTION:  The Pharmacy Committee has adopted a policy that restricts the use of metformin in hospitalized patients until all the contraindications to administration have been ruled out. Specific contraindications are: [x]  Serum creatinine ? 1.5 for males []  Serum creatinine ? 1.4 for females []  Shock, acute MI, sepsis, hypoxemia, dehydration []  Planned administration of intravenous iodinated contrast media []  Heart Failure patients with low EF []  Acute or chronic metabolic acidosis (including DKA)

## 2013-09-11 NOTE — Progress Notes (Signed)
Inpatient Diabetes Program Recommendations  AACE/ADA: New Consensus Statement on Inpatient Glycemic Control (2013)  Target Ranges:  Prepandial:   less than 140 mg/dL      Peak postprandial:   less than 180 mg/dL (1-2 hours)      Critically ill patients:  140 - 180 mg/dL   Reason for assessment: BG elevated  Diabetes history: Type 2 , A1C 9.2% Outpatient Diabetes medications: Novolog 11-27 units tid, Lantus 70 units at bedtime, Glucophage 1000mg  bid Current orders for Inpatient glycemic control: Novolog 0-15 units tid with meals, Novolog 4 units tid with meals, Lantus 70 units at bedtime, Glucophage 1000mg  bid    With an A1C of 9.2% and fasting BG consistently above 173mg /dl (generally above 200mg /dl), may want to consider increasing Lantus insulin to 75units QHS.   Gentry Fitz, RN, BA, MHA, CDE Diabetes Coordinator Inpatient Diabetes Program  [phone] (Team Pager) [phone] [facility]) 09/11/2013 11:47 AM

## 2013-09-11 NOTE — Care Management Note (Signed)
CARE MANAGEMENT NOTE 09/11/2013  Patient:  [name],[name]   Account Number:  [id number]  Date Initiated:  09/11/2013  Documentation initiated by:  Ricki Miller  Subjective/Objective Assessment:   53 yr old male admitted with right foot gangrene, s/p right BKA.     Action/Plan:   Case manager spoke with patient concerning home health and DME needs at discharge. Choice offered. Patient states his son will assist him at discharge. Referral called to Pearletha Forge, [facility] liaison. Patient has rolling walker.   Anticipated DC Date:  09/11/2013   Anticipated DC Plan:  [place]  CM consult      PAC Choice  [place]   Choice offered to / List presented to:  C-1 Patient   DME arranged  3-N-1  [facility]      DME agency  [place] arranged  [place].   Status of service:  Completed, signed off Medicare Important Message given?   (If response is "NO", the following Medicare IM given date fields will be blank) Date Medicare IM given:   Medicare IM given by:   Date Additional Medicare IM given:   Additional Medicare IM given by:    Discharge Disposition:  [place]  Per UR Regulation:  Reviewed for med. necessity/level of care/duration of stay

## 2013-09-11 NOTE — Progress Notes (Signed)
Occupational Therapy Treatment Patient Details Name: [name] MRN: [id number] DOB: [birth year] Today's Date: 09/11/2013    History of present illness [name] is a 53 y.o. 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 Pt has previous Lt midfoot amputation. 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 PMH of HTN, osteomyelitis, diabetic neuropathy, PAD/PVD, DM, and CHF.    OT comments  Pt seen for acute OT treatment session. Limited participation due to nausea/dizziness with movement (see below). Educated on techniques for ADL completion.   Follow Up Recommendations  Home health OT;Supervision/Assistance - 24 hour;Other (comment)    Equipment Recommendations  Tub/shower bench;Other (comment)    Recommendations for Other Services      Precautions / Restrictions Precautions Precautions: Other (comment);Fall Restrictions Weight Bearing Restrictions: Yes RLE Weight Bearing: Non weight bearing       Mobility Bed Mobility               General bed mobility comments: Pt initially stating he was willing to try supine> EOB with HOB elevated. Pt ultimately declined bed mobility second to nausea/dizziness with movement.  Transfers                 General transfer comment: pt declined transfers.    Balance                                   ADL                                         General ADL Comments: Reviewed techniques for ADL completion. Pt stated he has been able to dress and bathe self with setup/supervision in sit<>stand. Able to state technique for LB dressing. Reviewed tub bench transfer. Pt has multiple steps to navigate to get to main living level on second floor at home. Hand rail on left side.      Vision                     Perception     Praxis      Cognition   Behavior During Therapy: WFL for tasks assessed/performed Overall Cognitive Status: Within Functional Limits for  tasks assessed                       Extremity/Trunk Assessment               Exercises     Shoulder Instructions       General Comments      Pertinent Vitals/ Pain       Pain Assessment: 0-10 Pain Score: 7  Pain Location: abdomen Pain Descriptors / Indicators: Aching;Tender;Throbbing Pain Intervention(s): Limited activity within patient's tolerance;Monitored during session;Utilized relaxation techniques  Home Living                                          Prior Functioning/Environment              Frequency Min 2X/week     Progress Toward Goals  OT Goals(current goals can now be found in the care plan section)  Progress towards OT goals: Progressing toward goals (per pt report. limited  participation today- dizziness )  Acute Rehab OT Goals Patient Stated Goal: Go home OT Goal Formulation: With patient Time For Goal Achievement: 09/23/13 Potential to Achieve Goals: Good ADL Goals Pt Will Perform Grooming: with modified independence;standing Pt Will Perform Lower Body Dressing: with modified independence;sit to/from stand Pt Will Transfer to Toilet: with modified independence;ambulating;bedside commode Pt Will Perform Toileting - Clothing Manipulation and hygiene: with modified independence;sit to/from stand Pt Will Perform Tub/Shower Transfer: Tub transfer;with modified independence;tub bench;rolling walker  Plan Discharge plan remains appropriate    Co-evaluation                 End of Session     Activity Tolerance Other (comment) (Pt limited by nausea/dizziness. )   Patient Left in bed;with call bell/phone within reach;with nursing/sitter in room   Nurse Communication Other (comment) (bp stats. low bp RN suspects meds.)        Time: 1025-1055 OT Time Calculation (min): 30 min  Charges: OT General Charges $OT Visit: 1 Procedure OT Treatments $Self Care/Home Management : 23-37 mins  Hortencia Pilar 09/11/2013, 11:09 AM

## 2013-09-11 NOTE — Progress Notes (Addendum)
Physical Therapy Treatment Patient Details Name: [name] MRN: [id number] DOB: [birth year] Today's Date: 09/11/2013    History of Present Illness [name] is a 53 y.o. 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 Pt has previous Lt midfoot amputation. 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 PMH of HTN, osteomyelitis, diabetic neuropathy, PAD/PVD, DM, and CHF.     PT Comments    Pt lethargic with hypotension, cool/clammy skin, significant abd pain and unable to participate in full PT assessment/treatment. Discussed with nursing student and preceptor (present in room during PT session) and notified pt's RN, Selena Batten. Pt not appropriate for d/c home today from a PT/mobility standpoint (he has one step to enter home, however full bath and bedroom are on 2nd floor).    Follow Up Recommendations  Other (comment) (TBA when medically appropriate)     Equipment Recommendations  3in1 (PT)    Recommendations for Other Services       Precautions / Restrictions Precautions Precautions: Fall Restrictions Weight Bearing Restrictions: Yes RLE Weight Bearing: Non weight bearing    Mobility  Bed Mobility Overal bed mobility: Needs Assistance Bed Mobility: Sidelying to Sit;Sit to Sidelying   Sidelying to sit: Mod assist;HOB elevated     Sit to sidelying: Min assist General bed mobility comments: Pt very lethargic; states "OK'  to sitting up, however does not initiate mobility; assisted to EOB to assess for orthostasis  Transfers                 General transfer comment: unable due to lethargy  Ambulation/Gait                 Stairs            Wheelchair Mobility    Modified Rankin (Stroke Patients Only)       Balance Overall balance assessment: Needs assistance Sitting-balance support: Single extremity supported;Feet supported Sitting balance-Leahy Scale: Poor Sitting balance - Comments: leaning to his Rt                            Cognition Arousal/Alertness: Lethargic Behavior During Therapy: Flat affect Overall Cognitive Status: Within Functional Limits for tasks assessed                      Exercises      General Comments General comments (skin integrity, edema, etc.): Nursing student and nursing preceptor in room as pt assessed. Also notified pt's RN, Selena Batten, re: his symptoms. (Cool, clammy, hypotensive with decr in supine to sit, abd pain and distension, "whirling" in his ears)      Pertinent Vitals/Pain Pain Assessment: 0-10 Pain Score: 8  Pain Location: Abd Pain Intervention(s): Limited activity within patient's tolerance;Monitored during session (pt refusing pain medicine due to constipation)  Supine HOB 45  92/63 Sitting EOB        86/57 Supine HOB 0  100/59 *pt dizzy and nauseous throughout    Home Living Family/patient expects to be discharged to:: Private residence Living Arrangements: Alone                  Prior Function            PT Goals (current goals can now be found in the care plan section) Progress towards PT goals: Not progressing toward goals - comment (medical issue)    Frequency  Min 3X/week    PT  Plan Other (comment) (TBA when medically appropriate)    Co-evaluation             End of Session   Activity Tolerance: Patient limited by lethargy;Treatment limited secondary to medical complications (Comment) Patient left: in bed;with call bell/phone within reach;with family/visitor present     Time: 1405-1440 PT Time Calculation (min): 35 min  Charges:  $Therapeutic Activity: 23-37 mins                    G Codes:      Nicolemarie Wooley 2013/09/22, 2:56 PM Pager [phone]

## 2013-09-11 NOTE — Progress Notes (Signed)
Patient continuing to complain of abdominal pain and discomfort. Patient's last BM was on 09/07/2013. Patient was given milk of magnesia and magnesium citrate solution at 0800 per MD order, in addition to scheduled colace at 1008. Patient experiencing no relief from medications. Patient complained of weakness and nausea at 1000. 4mg  of PO zofran was given and patient was encouraged to drink water. While working with PT this afternoon, patient became very weak, clammy, and dizzy. VS were assessed sitting at 1420: BP 86/57 and lying at 1440: BP 105/68, Temp 98.2, HR 74, RR 19, O2 97% on room air. Dr. Sharol Given was notified and patient's condition was discussed. Orders were placed for multiple lab draws, fleets enema, and encourage fluids. Fleets enema was given at 1542 and patient was able to have a very small BM. Will continue to encourage fluids and monitor patients condition. Patient will not be discharged today.

## 2013-09-11 NOTE — Progress Notes (Signed)
Patient ID: [name], male   DOB: 1960/05/06, 53 y.o.   MRN: [id number] Plan for discharge today after bowel movement. Orders written for milk of magnesia and magnesium citrate.

## 2013-09-11 NOTE — Discharge Summary (Signed)
Physician Discharge Summary  Patient ID: [name] MRN: [id number] DOB/AGE: September 02, 1960 53 y.o.  Admit date: 09/07/2013 Discharge date: 09/11/2013  Admission Diagnoses: Osteomyelitis ulceration right foot  Discharge Diagnoses:  Active Problems:   Below knee amputation status   Discharged Condition: stable  Hospital Course: Patient's hospital course was essentially unremarkable. He underwent a transtibial amputation the right. Postoperatively patient did develop constipation secondary to narcotics. Patient was discharged after a bowel movement.  Consults: None  Significant Diagnostic Studies: labs: Routine labs  Treatments: surgery: See operative note  Discharge Exam: Blood pressure 106/61, pulse 91, temperature 98 F (36.7 C), temperature source Oral, resp. rate 18, height 6' (1.829 m), weight 82.101 kg (181 lb), SpO2 100.00%. Incision/Wound: dressing clean dry and intact  Disposition: 01-Home or Self Care  Discharge Instructions   Call MD / Call 911    Complete by:  As directed   If you experience chest pain or shortness of breath, CALL 911 and be transported to the hospital emergency room.  If you develope a fever above 101 F, pus (white drainage) or increased drainage or redness at the wound, or calf pain, call your surgeon's office.     Call MD / Call 911    Complete by:  As directed   If you experience chest pain or shortness of breath, CALL 911 and be transported to the hospital emergency room.  If you develope a fever above 101 F, pus (white drainage) or increased drainage or redness at the wound, or calf pain, call your surgeon's office.     Constipation Prevention    Complete by:  As directed   Drink plenty of fluids.  Prune juice may be helpful.  You may use a stool softener, such as Colace (over the counter) 100 mg twice a day.  Use MiraLax (over the counter) for constipation as needed.     Constipation Prevention    Complete by:  As directed   Drink plenty of  fluids.  Prune juice may be helpful.  You may use a stool softener, such as Colace (over the counter) 100 mg twice a day.  Use MiraLax (over the counter) for constipation as needed.     Diet - low sodium heart healthy    Complete by:  As directed      Diet - low sodium heart healthy    Complete by:  As directed      Increase activity slowly as tolerated    Complete by:  As directed      Increase activity slowly as tolerated    Complete by:  As directed      Non weight bearing    Complete by:  As directed   Laterality:  right  Extremity:  Lower            Medication List         amLODipine 5 MG tablet  Commonly known as:  NORVASC  Take 1 tablet (5 mg total) by mouth daily.     amoxicillin-clavulanate 875-125 MG per tablet  Commonly known as:  AUGMENTIN  Take 1 tablet by mouth 2 (two) times daily.     aspirin 325 MG EC tablet  Take 325 mg by mouth daily.     carvedilol 3.125 MG tablet  Commonly known as:  COREG  Take 1 tablet (3.125 mg total) by mouth 2 (two) times daily with a meal.     esomeprazole 20 MG capsule  Commonly known as:  NEXIUM  Take  1 capsule (20 mg total) by mouth daily.     furosemide 20 MG tablet  Commonly known as:  LASIX  Take 2 tablets (40 mg total) by mouth daily.     gabapentin 300 MG capsule  Commonly known as:  NEURONTIN  Take 2 capsules (600 mg total) by mouth 3 (three) times daily.     insulin aspart 100 UNIT/ML injection  Commonly known as:  novoLOG  Inject 11-27 Units into the skin 3 (three) times daily before meals.     insulin glargine 100 UNIT/ML injection  Commonly known as:  LANTUS  Inject 70 Units into the skin at bedtime.     metFORMIN 1000 MG tablet  Commonly known as:  GLUCOPHAGE  Take 1,000 mg by mouth 2 (two) times daily with a meal.     nitroGLYCERIN 0.2 mg/hr patch  Commonly known as:  NITRODUR - Dosed in mg/24 hr  Place 0.2 mg onto the skin daily. Apply to right foot     oxyCODONE-acetaminophen 5-325 MG per  tablet  Commonly known as:  ROXICET  Take 1 tablet by mouth every 4 (four) hours as needed for severe pain.     oxyCODONE-acetaminophen 5-325 MG per tablet  Commonly known as:  ROXICET  Take 1 tablet by mouth every 4 (four) hours as needed for severe pain.     oxyCODONE-acetaminophen 5-325 MG per tablet  Commonly known as:  PERCOCET/ROXICET  Take 1 tablet by mouth every 4 (four) hours as needed for moderate pain or severe pain.     rosuvastatin 5 MG tablet  Commonly known as:  CRESTOR  Take 5 mg by mouth at bedtime.     traMADol 50 MG tablet  Commonly known as:  ULTRAM  Take 1 tablet (50 mg total) by mouth 2 (two) times daily as needed (pain).           Follow-up Information   Follow up with DUDA,MARCUS V, MD In 2 weeks.   Specialty:  Orthopedic Surgery   Contact information:   [place] [address] [phone]       Signed: [name] 09/11/2013, 6:49 AM

## 2013-09-12 ENCOUNTER — Ambulatory Visit: Payer: Medicaid Other | Admitting: Internal Medicine

## 2013-09-12 ENCOUNTER — Inpatient Hospital Stay (HOSPITAL_COMMUNITY): Payer: Medicaid Other

## 2013-09-12 DIAGNOSIS — N170 Acute kidney failure with tubular necrosis: Secondary | ICD-10-CM

## 2013-09-12 DIAGNOSIS — S88119A Complete traumatic amputation at level between knee and ankle, unspecified lower leg, initial encounter: Secondary | ICD-10-CM

## 2013-09-12 DIAGNOSIS — K567 Ileus, unspecified: Secondary | ICD-10-CM | POA: Diagnosis not present

## 2013-09-12 DIAGNOSIS — R7881 Bacteremia: Secondary | ICD-10-CM

## 2013-09-12 DIAGNOSIS — D649 Anemia, unspecified: Secondary | ICD-10-CM | POA: Diagnosis present

## 2013-09-12 DIAGNOSIS — N179 Acute kidney failure, unspecified: Secondary | ICD-10-CM | POA: Diagnosis present

## 2013-09-12 DIAGNOSIS — R5383 Other fatigue: Secondary | ICD-10-CM | POA: Diagnosis not present

## 2013-09-12 DIAGNOSIS — E118 Type 2 diabetes mellitus with unspecified complications: Secondary | ICD-10-CM

## 2013-09-12 DIAGNOSIS — R5381 Other malaise: Secondary | ICD-10-CM

## 2013-09-12 DIAGNOSIS — K9189 Other postprocedural complications and disorders of digestive system: Secondary | ICD-10-CM

## 2013-09-12 DIAGNOSIS — IMO0002 Reserved for concepts with insufficient information to code with codable children: Secondary | ICD-10-CM

## 2013-09-12 DIAGNOSIS — E1165 Type 2 diabetes mellitus with hyperglycemia: Secondary | ICD-10-CM

## 2013-09-12 LAB — PROTIME-INR
INR: 1.21 (ref 0.00–1.49)
Prothrombin Time: 15.3 seconds — ABNORMAL HIGH (ref 11.6–15.2)

## 2013-09-12 LAB — GLUCOSE, CAPILLARY
GLUCOSE-CAPILLARY: 92 mg/dL (ref 70–99)
GLUCOSE-CAPILLARY: 120 mg/dL — AB (ref 70–99)
GLUCOSE-CAPILLARY: 70 mg/dL (ref 70–99)
GLUCOSE-CAPILLARY: 108 mg/dL — AB (ref 70–99)
GLUCOSE-CAPILLARY: 23 mg/dL — AB (ref 70–99)
Glucose-Capillary: 144 mg/dL — ABNORMAL HIGH (ref 70–99)
Glucose-Capillary: 53 mg/dL — ABNORMAL LOW (ref 70–99)
Glucose-Capillary: 167 mg/dL — ABNORMAL HIGH (ref 70–99)

## 2013-09-12 LAB — CBC
HCT: 26.6 % — ABNORMAL LOW (ref 39.0–52.0)
HEMATOCRIT: 23.8 % — AB (ref 39.0–52.0)
HEMATOCRIT: 22.7 % — AB (ref 39.0–52.0)
HEMOGLOBIN: 8.5 g/dL — AB (ref 13.0–17.0)
HEMOGLOBIN: 7.7 g/dL — AB (ref 13.0–17.0)
Hemoglobin: 7.2 g/dL — ABNORMAL LOW (ref 13.0–17.0)
MCH: 25.8 pg — AB (ref 26.0–34.0)
MCH: 26.2 pg (ref 26.0–34.0)
MCH: 25.7 pg — ABNORMAL LOW (ref 26.0–34.0)
MCHC: 32 g/dL (ref 30.0–36.0)
MCHC: 32.4 g/dL (ref 30.0–36.0)
MCHC: 31.7 g/dL (ref 30.0–36.0)
MCV: 80.9 fL (ref 78.0–100.0)
MCV: 81 fL (ref 78.0–100.0)
MCV: 81.1 fL (ref 78.0–100.0)
PLATELETS: 365 10*3/uL (ref 150–400)
PLATELETS: 330 10*3/uL (ref 150–400)
Platelets: 402 10*3/uL — ABNORMAL HIGH (ref 150–400)
RBC: 3.29 MIL/uL — ABNORMAL LOW (ref 4.22–5.81)
RBC: 2.94 MIL/uL — ABNORMAL LOW (ref 4.22–5.81)
RBC: 2.8 MIL/uL — ABNORMAL LOW (ref 4.22–5.81)
RDW: 15.1 % (ref 11.5–15.5)
RDW: 15.1 % (ref 11.5–15.5)
RDW: 15.2 % (ref 11.5–15.5)
WBC: 15.2 10*3/uL — ABNORMAL HIGH (ref 4.0–10.5)
WBC: 15.5 10*3/uL — ABNORMAL HIGH (ref 4.0–10.5)
WBC: 7.4 10*3/uL (ref 4.0–10.5)

## 2013-09-12 LAB — COMPREHENSIVE METABOLIC PANEL
ALK PHOS: 245 U/L — AB (ref 39–117)
ALT: 64 U/L — ABNORMAL HIGH (ref 0–53)
ANION GAP: 21 — AB (ref 5–15)
AST: 72 U/L — ABNORMAL HIGH (ref 0–37)
Albumin: 2.4 g/dL — ABNORMAL LOW (ref 3.5–5.2)
BUN: 56 mg/dL — AB (ref 6–23)
CO2: 33 mEq/L — ABNORMAL HIGH (ref 19–32)
Calcium: 8.8 mg/dL (ref 8.4–10.5)
Chloride: 83 mEq/L — ABNORMAL LOW (ref 96–112)
Creatinine, Ser: 5.48 mg/dL — ABNORMAL HIGH (ref 0.50–1.35)
GFR, EST AFRICAN AMERICAN: 12 mL/min — AB (ref >90)
GFR, EST NON AFRICAN AMERICAN: 11 mL/min — AB (ref >90)
GLUCOSE: 62 mg/dL — AB (ref 70–99)
POTASSIUM: 4.8 meq/L (ref 3.7–5.3)
Sodium: 137 mEq/L (ref 137–147)
TOTAL PROTEIN: 8.1 g/dL (ref 6.0–8.3)
Total Bilirubin: 0.5 mg/dL (ref 0.3–1.2)

## 2013-09-12 LAB — BASIC METABOLIC PANEL
Anion gap: 22 — ABNORMAL HIGH (ref 5–15)
BUN: 60 mg/dL — ABNORMAL HIGH (ref 6–23)
CHLORIDE: 89 meq/L — AB (ref 96–112)
CO2: 24 meq/L (ref 19–32)
Calcium: 7.6 mg/dL — ABNORMAL LOW (ref 8.4–10.5)
Creatinine, Ser: 5.22 mg/dL — ABNORMAL HIGH (ref 0.50–1.35)
GFR calc Af Amer: 13 mL/min — ABNORMAL LOW (ref >90)
GFR calc non Af Amer: 11 mL/min — ABNORMAL LOW (ref >90)
GLUCOSE: 65 mg/dL — AB (ref 70–99)
Potassium: 4.7 mEq/L (ref 3.7–5.3)
SODIUM: 135 meq/L — AB (ref 137–147)

## 2013-09-12 LAB — TYPE AND SCREEN
ABO/RH(D): A POS
ANTIBODY SCREEN: NEGATIVE

## 2013-09-12 LAB — POCT I-STAT 3, ART BLOOD GAS (G3+)
Acid-Base Excess: 8 mmol/L — ABNORMAL HIGH (ref 0.0–2.0)
Bicarbonate: 34 mEq/L — ABNORMAL HIGH (ref 20.0–24.0)
O2 Saturation: 95 %
PCO2 ART: 54.5 mmHg — AB (ref 35.0–45.0)
Patient temperature: 98.3
TCO2: 36 mmol/L (ref 0–100)
pH, Arterial: 7.402 (ref 7.350–7.450)
pO2, Arterial: 76 mmHg — ABNORMAL LOW (ref 80.0–100.0)

## 2013-09-12 LAB — URINALYSIS, ROUTINE W REFLEX MICROSCOPIC
Glucose, UA: NEGATIVE mg/dL
Hgb urine dipstick: NEGATIVE
Ketones, ur: NEGATIVE mg/dL
Leukocytes, UA: NEGATIVE
NITRITE: NEGATIVE
PROTEIN: 100 mg/dL — AB
SPECIFIC GRAVITY, URINE: 1.021 (ref 1.005–1.030)
Urobilinogen, UA: 0.2 mg/dL (ref 0.0–1.0)
pH: 5 (ref 5.0–8.0)

## 2013-09-12 LAB — URINE MICROSCOPIC-ADD ON

## 2013-09-12 LAB — LIPASE, BLOOD: LIPASE: 11 U/L (ref 11–59)

## 2013-09-12 LAB — LACTIC ACID, PLASMA: Lactic Acid, Venous: 1.3 mmol/L (ref 0.5–2.2)

## 2013-09-12 LAB — TROPONIN I
Troponin I: <0.3 ng/mL (ref <0.30)
Troponin I: <0.3 ng/mL (ref <0.30)

## 2013-09-12 LAB — PROCALCITONIN: PROCALCITONIN: 16.27 ng/mL

## 2013-09-12 LAB — CREATININE, URINE, RANDOM: Creatinine, Urine: 137.25 mg/dL

## 2013-09-12 LAB — SODIUM, URINE, RANDOM: Sodium, Ur: 27 mEq/L

## 2013-09-12 LAB — MRSA PCR SCREENING: MRSA by PCR: NEGATIVE

## 2013-09-12 LAB — AMYLASE: AMYLASE: 31 U/L (ref 0–105)

## 2013-09-12 LAB — PRO B NATRIURETIC PEPTIDE: PRO B NATRI PEPTIDE: 1548 pg/mL — AB (ref 0–125)

## 2013-09-12 MED ORDER — DEXTROSE 50 % IV SOLN: 25.0000 mL | Freq: Once | INTRAVENOUS | Status: DC | PRN

## 2013-09-12 MED ORDER — DEXTROSE-NACL 5-0.9 % IV SOLN
Freq: Once | INTRAVENOUS | Status: AC
  Administered 2013-09-12: 13:00:00 via INTRAVENOUS

## 2013-09-12 MED ORDER — VANCOMYCIN HCL IN DEXTROSE 1-5 GM/200ML-% IV SOLN: 1000.0000 mg | INTRAVENOUS | Status: DC

## 2013-09-12 MED ORDER — SODIUM CHLORIDE 0.9 % IV BOLUS (SEPSIS): 1000.0000 mL | INTRAVENOUS | Status: DC | PRN

## 2013-09-12 MED ORDER — DEXTROSE 50 % IV SOLN
INTRAVENOUS | Status: AC
  Filled 2013-09-12: qty 50

## 2013-09-12 MED ORDER — NOREPINEPHRINE BITARTRATE 1 MG/ML IV SOLN
2.0000 ug/min | INTRAVENOUS | Status: DC
  Administered 2013-09-12: 2 ug/min via INTRAVENOUS
  Filled 2013-09-12: qty 4

## 2013-09-12 MED ORDER — PANTOPRAZOLE SODIUM 40 MG IV SOLR
40.0000 mg | Freq: Every day | INTRAVENOUS | Status: DC
  Administered 2013-09-12 – 2013-09-15 (×4): 40 mg via INTRAVENOUS
  Filled 2013-09-12 (×5): qty 40

## 2013-09-12 MED ORDER — SODIUM CHLORIDE 0.9 % IV SOLN: 250.0000 mL | INTRAVENOUS | Status: DC | PRN

## 2013-09-12 MED ORDER — SODIUM CHLORIDE 0.9 % IV BOLUS (SEPSIS)
1000.0000 mL | INTRAVENOUS | Status: AC
  Administered 2013-09-12 (×3): 1000 mL via INTRAVENOUS

## 2013-09-12 MED ORDER — PIPERACILLIN-TAZOBACTAM 3.375 G IVPB 30 MIN
3.3750 g | Freq: Once | INTRAVENOUS | Status: AC
  Administered 2013-09-12: 3.375 g via INTRAVENOUS
  Filled 2013-09-12 (×2): qty 50

## 2013-09-12 MED ORDER — VANCOMYCIN HCL 10 G IV SOLR
1500.0000 mg | Freq: Once | INTRAVENOUS | Status: AC
  Administered 2013-09-12: 1500 mg via INTRAVENOUS
  Filled 2013-09-12 (×2): qty 1500

## 2013-09-12 MED ORDER — PIPERACILLIN-TAZOBACTAM IN DEX 2-0.25 GM/50ML IV SOLN
2.2500 g | Freq: Three times a day (TID) | INTRAVENOUS | Status: DC
  Administered 2013-09-12 – 2013-09-13 (×2): 2.25 g via INTRAVENOUS
  Filled 2013-09-12 (×4): qty 50

## 2013-09-12 MED ORDER — INSULIN ASPART 100 UNIT/ML ~~LOC~~ SOLN: 0.0000 [IU] | SUBCUTANEOUS | Status: DC

## 2013-09-12 MED ORDER — DEXTROSE 50 % IV SOLN
INTRAVENOUS | Status: AC
  Administered 2013-09-12: 50 mL
  Filled 2013-09-12: qty 50

## 2013-09-12 MED ORDER — MORPHINE SULFATE 2 MG/ML IJ SOLN
1.0000 mg | INTRAMUSCULAR | Status: DC | PRN
  Administered 2013-09-12: 1 mg via INTRAVENOUS
  Administered 2013-09-13 – 2013-09-16 (×2): 2 mg via INTRAVENOUS
  Administered 2013-09-16: 1 mg via INTRAVENOUS
  Administered 2013-09-16 – 2013-09-17 (×3): 2 mg via INTRAVENOUS
  Administered 2013-09-17: 1 mg via INTRAVENOUS
  Administered 2013-09-17 – 2013-09-18 (×3): 2 mg via INTRAVENOUS
  Filled 2013-09-12 (×12): qty 1

## 2013-09-12 MED ORDER — DEXTROSE 50 % IV SOLN
50.0000 mL | Freq: Once | INTRAVENOUS | Status: AC | PRN
  Administered 2013-09-12: 50 mL via INTRAVENOUS

## 2013-09-12 MED ORDER — DEXTROSE-NACL 5-0.45 % IV SOLN
INTRAVENOUS | Status: DC
  Administered 2013-09-12: 1000 mL via INTRAVENOUS
  Administered 2013-09-13: 22:00:00 via INTRAVENOUS
  Administered 2013-09-13: 1000 mL via INTRAVENOUS
  Administered 2013-09-15: 100 mL/h via INTRAVENOUS

## 2013-09-12 MED ORDER — SODIUM CHLORIDE 0.9 % IV BOLUS (SEPSIS)
1000.0000 mL | Freq: Once | INTRAVENOUS | Status: AC
  Administered 2013-09-12: 1000 mL via INTRAVENOUS

## 2013-09-12 NOTE — Progress Notes (Addendum)
ANTIBIOTIC CONSULT NOTE - INITIAL  Pharmacy Consult for Vancomycin, Zosyn Indication: rule out sepsis  No Known Allergies  Patient Measurements: Height: 6' (182.9 cm) Weight: 181 lb (82.101 kg) IBW/kg (Calculated) : 77.6 Vital Signs: Temp: 98.4 F (36.9 C) (09/09 0745) Temp src: Oral (09/09 0745) BP: 92/62 mmHg (09/09 0745) Pulse Rate: 85 (09/09 0745) Intake/Output from previous day: 09/08 0701 - 09/09 0700 In: 220 [P.O.:220] Out: 250 [Urine:250] Intake/Output from this shift: Total I/O In: 0  Out: 850 [Urine:250; Emesis/NG output:600]  Labs:  Recent Labs  09/11/13 1650 09/12/13 0451 09/12/13 0810 09/12/13 1015  WBC  --  15.2*  --  15.5*  HGB  --  8.5*  --  7.7*  PLT  --  365  --  402*  LABCREA  --   --  137.25  --   CREATININE 3.62*  --   --  5.48*   Estimated Creatinine Clearance: 17.1 ml/min (by C-G formula based on Cr of 5.48). No results found for this basename: VANCOTROUGH, VANCOPEAK, VANCORANDOM, GENTTROUGH, GENTPEAK, GENTRANDOM, TOBRATROUGH, TOBRAPEAK, TOBRARND, AMIKACINPEAK, AMIKACINTROU, AMIKACIN,  in the last 72 hours   Microbiology: No results found for this or any previous visit (from the past 720 hour(s)).  Medical History: Past Medical History  Diagnosis Date  . Hyperlipidemia   . Hypertension   . Osteomyelitis 2010    left foot, s/p midfoot amputation  . 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 Neuromuscular disorder     diabetic neruopathy  . PAD (peripheral artery disease)     ABIs 11/30/11: L ABI 0.68, R ABI 0.84  . Pneumonia 2010  . Critical lower limb ischemia, lt with ABI of 0.60 12/31/2011  . PVD (peripheral vascular disease) 12/31/2011  . S/P angioplasty with stent, 12/30/11, of Lt SFA, Post. tibialis and PTA of L. ant and post. tibial vessels 12/31/2011  . Type II diabetes mellitus ~ 2002  . GERD (gastroesophageal reflux disease)   . CHF (congestive heart failure)   . Gangrene     right foot   . 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 Today has worsening ileus thought to be due to narcotics, abdominal pain, hypotension, and acute renal failure now to start IV vancomycin and Zosyn for r/o sepsis. WBC 15.5, afebrile. SCr 5.48, est CrCl~ 17 mL/min. UOP low - 40cc of dark urine.   Hx of polymicrobial bacteremia (MRSA, Enterococcus, Proteus) and L-foot osteo on IV antibiotics for 12 wks (most recently Vancomycin + Ertapenem) then received 2 wks of Augmentin (last dose 9/3).   Goal of Therapy:  Vancomycin trough level 15-20 mcg/ml  Plan:  1. Zosyn 3.375g IV now, then 2.25g IV q8h 2. Vancomycin 1500 mg IV x1 then 1g IV q48h 3. Monitor renal function, culture results, and clinical status.  Sloan Leiter, PharmD, BCPS Clinical Pharmacist [phone] 09/12/2013,1:52 PM
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
## 2013-09-12 NOTE — Progress Notes (Signed)
Patient has increased lethargy, still able to arouse and alert and orient x4, belly still distended and tender to touch. Patient vomited 58ml of green/brown vomit. Suction started. Dr Morton Stall paged and informed of patient status and rapid response called to evaluate. RR nurse in with patient and did not want to infuse bolus of D5NS per Dr Morton Stall. Dr called again to allow RR nurse to speak with her about plan of care and transfer to the unit.

## 2013-09-12 NOTE — Progress Notes (Signed)
PT Cancellation Note  Patient Details Name: [name] MRN: [id number] DOB: 11-05-1960   Cancelled Treatment:    Reason Eval/Treat Not Completed: Patient at procedure or test/unavailable. Patient at Xray at this time. Will follow up later today.    Jacqualyn Posey 09/12/2013, 9:09 AM

## 2013-09-12 NOTE — Addendum Note (Signed)
Addended by: Yvonna Alanis E on: 09/12/2013 01:18 PM   Modules accepted: Orders

## 2013-09-12 NOTE — IPOC Note (Signed)
CBG 53- 1 amp 50% dextrose given IV. PT lethargic, arouses oriented x 3.

## 2013-09-12 NOTE — Procedures (Signed)
Central Venous Catheter Insertion Procedure Note [name] [id number] 1960/12/01  Procedure: Insertion of Central Venous Catheter Indications: Assessment of intravascular volume, Drug and/or fluid administration and Frequent blood sampling  Procedure Details Consent: Risks of procedure as well as the alternatives and risks of each were explained to the (patient/caregiver).  Consent for procedure obtained. Time Out: Verified patient identification, verified procedure, site/side was marked, verified correct patient position, special equipment/implants available, medications/allergies/relevent history reviewed, required imaging and test results available.  Performed  Maximum sterile technique was used including antiseptics, cap, gloves, gown, hand hygiene, mask and sheet. Skin prep: Chlorhexidine; local anesthetic administered A antimicrobial bonded/coated triple lumen catheter was placed in the right internal jugular vein using the Seldinger technique.  Evaluation Blood flow good Complications: No apparent complications Patient did tolerate procedure well. Chest X-ray ordered to verify placement.  CXR: pending.   Performed under direct MD supervision.  Performed using ultrasound guidance.  Wire visualized in vessel under ultrasound.   Placed by [name], ACNP  US Guidance Emergency Shock  [name]. [name] Mould, MD, [place] Pgr: [facility]
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
## 2013-09-12 NOTE — Progress Notes (Signed)
INTERNAL MEDICINE TEACHING ATTENDING ADDENDUM - Aldine Contes, MD: I personally saw and evaluated Mr. Vandrunen in this clinic visit in conjunction with the resident, Dr. Hulen Luster. I have discussed patient's plan of care with medical resident during this visit. I have confirmed the physical exam findings and have read and agree with the clinic note including the plan with the following addition: - Pt on augmentin s/p recent PICC line infection - To f/u with ID for possible PNA v/s atelectasis - A1C improving. C/w current regimen - Will refer pt to wound care for follow up - Pt also to follow up with Dr. Sharol Given given recent amputation - c/w pain control

## 2013-09-12 NOTE — Significant Event (Signed)
Rapid Response Event Note  Overview:  Called as second set of eyes for patient with decreased LOC Time Called: 1226 Arrival Time: 1230 Event Type: Neurologic;Hypotension  Initial Focused Assessment:  On arrival patient supine in bed - cold and clammy skin - head turned to the left side - will arouse with brief eye opening to sternal rub - resps shallow and regular without distress - no JVD - on 2 liters nasal cannula - bil BS present few scattered Rhonchi but shallow resps.  Abd large and distended - moans with palp - generalized no localization noted - NGT patent to intermittent LWS - large amounts green bile return.  Foley patent - very scant amounts dk urine - dark amber.  Oral mucosa extremely dry.  Right leg BKA DDI - no swelling noted.  Left PIV to NS = had 500 cc NS bolus this am -now at [facility].  Pupils are 27mm=and reactive.  Moves to DPS purposeful.  Staff reports increase in lethargy starting yesterday - now minimal response.  Has weak cough with stimulation.  Manual BP 82/42 HR 88 RR 12 O2 sats 98% on nasal cannula. Rectal temp 98.2   CBG now 144 - had been 53 at 1150 - treated with D50w.     Interventions: Started NS bolus at wide open.  Chart and labs reviewed.  Stat call to Dr. Wynelle Cleveland - updated patients status - requested ICU transfer instead of SDU - asssited MD with call to PCCM.  Georgann Housekeeper NP on floor - quick look at patient .  Orders noted.  Stat call for ICU bed.  BP 92/42 with NS infusing at wide open.  Placed on heart monitor - HR 78 SR. Repeat CBG 167.   Patient prepared for transfer.  Dr. Titus Mould in.  Patient will now open eyes to loud voice but continues to fall back asleep. BP 90/52 manual. HR 78  RR 16.   Urgent transfer to 27M16 without incident.  Handoff report to [employer]  Dr. Titus Mould present - updated.    Event Summary: Name of Physician Notified: Dr. Wynelle Cleveland at 1235  Name of Consulting Physician Notified: Dr. Titus Mould at 1255 (by Dr. Wynelle Cleveland)  Outcome: Transferred  (Comment) (27M)  Event End Time: 1335  Quin Hoop

## 2013-09-12 NOTE — Progress Notes (Signed)
PT Cancellation Note  Patient Details Name: Ebrahim Belman MRN: [id number] DOB: 06/26/1960   Cancelled Treatment:    Reason Eval/Treat Not Completed: Medical issues which prohibited therapy. Attempt to see patient as he has return from [place], however nursing and nursing student are about to place NG tube. Per RN, would hold therapy today as patient very lethargic and not following commands well. Will attempt to see tomorrow.   Jacqualyn Posey 09/12/2013, 10:43 AM

## 2013-09-12 NOTE — Progress Notes (Signed)
Patient ID: [name], male   DOB: 1960-09-14, 53 y.o.   MRN: [id number] Patient's vital signs are stable, he did have a very small bowel movement last night however patient still has an ileus with a very small amount of vomitus last night.  Patient's abdomen is distended with decreased bowel sounds. Patient has decreased responsiveness this morning. Patient states that he is sleepy. He has not received any narcotics. Patient's labs obtained last night does show acute decrease in his renal function.  Hospitalist was consulted this morning.

## 2013-09-13 DIAGNOSIS — K560 Paralytic ileus: Secondary | ICD-10-CM

## 2013-09-13 DIAGNOSIS — K929 Disease of digestive system, unspecified: Secondary | ICD-10-CM

## 2013-09-13 DIAGNOSIS — A4189 Other specified sepsis: Secondary | ICD-10-CM | POA: Diagnosis present

## 2013-09-13 DIAGNOSIS — A419 Sepsis, unspecified organism: Secondary | ICD-10-CM | POA: Diagnosis present

## 2013-09-13 DIAGNOSIS — R6520 Severe sepsis without septic shock: Secondary | ICD-10-CM

## 2013-09-13 LAB — URINE CULTURE
Colony Count: NO GROWTH
Culture: NO GROWTH

## 2013-09-13 LAB — BASIC METABOLIC PANEL
Anion gap: 13 (ref 5–15)
BUN: 58 mg/dL — AB (ref 6–23)
CALCIUM: 7.5 mg/dL — AB (ref 8.4–10.5)
CO2: 28 mEq/L (ref 19–32)
CREATININE: 3.74 mg/dL — AB (ref 0.50–1.35)
Chloride: 92 mEq/L — ABNORMAL LOW (ref 96–112)
GFR calc non Af Amer: 17 mL/min — ABNORMAL LOW (ref >90)
GFR, EST AFRICAN AMERICAN: 20 mL/min — AB (ref >90)
Glucose, Bld: 93 mg/dL (ref 70–99)
Potassium: 4.3 mEq/L (ref 3.7–5.3)
Sodium: 133 mEq/L — ABNORMAL LOW (ref 137–147)

## 2013-09-13 LAB — GLUCOSE, CAPILLARY
GLUCOSE-CAPILLARY: 68 mg/dL — AB (ref 70–99)
GLUCOSE-CAPILLARY: 81 mg/dL (ref 70–99)
GLUCOSE-CAPILLARY: 88 mg/dL (ref 70–99)
GLUCOSE-CAPILLARY: 94 mg/dL (ref 70–99)
Glucose-Capillary: 83 mg/dL (ref 70–99)
Glucose-Capillary: 70 mg/dL (ref 70–99)
Glucose-Capillary: 75 mg/dL (ref 70–99)

## 2013-09-13 LAB — LACTIC ACID, PLASMA: Lactic Acid, Venous: 1.2 mmol/L (ref 0.5–2.2)

## 2013-09-13 LAB — PROCALCITONIN: PROCALCITONIN: 4.8 ng/mL

## 2013-09-13 LAB — TROPONIN I

## 2013-09-13 LAB — CORTISOL: Cortisol, Plasma: 54.1 ug/dL

## 2013-09-13 MED ORDER — INSULIN ASPART 100 UNIT/ML ~~LOC~~ SOLN
0.0000 [IU] | SUBCUTANEOUS | Status: DC
  Administered 2013-09-14 (×2): 1 [IU] via SUBCUTANEOUS
  Administered 2013-09-15: 2 [IU] via SUBCUTANEOUS
  Administered 2013-09-15 (×2): 1 [IU] via SUBCUTANEOUS
  Administered 2013-09-15: 2 [IU] via SUBCUTANEOUS
  Administered 2013-09-15: 1 [IU] via SUBCUTANEOUS
  Administered 2013-09-15: 5 [IU] via SUBCUTANEOUS
  Administered 2013-09-16: 3 [IU] via SUBCUTANEOUS
  Administered 2013-09-16: 2 [IU] via SUBCUTANEOUS

## 2013-09-13 MED ORDER — CHLORHEXIDINE GLUCONATE 0.12 % MT SOLN
15.0000 mL | Freq: Two times a day (BID) | OROMUCOSAL | Status: DC
  Administered 2013-09-13 – 2013-09-17 (×5): 15 mL via OROMUCOSAL
  Filled 2013-09-13 (×11): qty 15

## 2013-09-13 MED ORDER — CETYLPYRIDINIUM CHLORIDE 0.05 % MT LIQD
7.0000 mL | Freq: Two times a day (BID) | OROMUCOSAL | Status: DC
  Administered 2013-09-13 – 2013-09-17 (×5): 7 mL via OROMUCOSAL

## 2013-09-13 MED ORDER — SODIUM CHLORIDE 0.9 % IJ SOLN
10.0000 mL | INTRAMUSCULAR | Status: DC | PRN
  Administered 2013-09-14 – 2013-09-16 (×3): 10 mL
  Administered 2013-09-17: 30 mL

## 2013-09-13 MED ORDER — SODIUM CHLORIDE 0.9 % IJ SOLN
10.0000 mL | Freq: Two times a day (BID) | INTRAMUSCULAR | Status: DC
  Administered 2013-09-13 – 2013-09-17 (×4): 10 mL

## 2013-09-13 MED ORDER — DEXTROSE 50 % IV SOLN
25.0000 mL | Freq: Once | INTRAVENOUS | Status: AC | PRN
  Administered 2013-09-13: 25 mL via INTRAVENOUS

## 2013-09-13 MED ORDER — METOCLOPRAMIDE HCL 5 MG/ML IJ SOLN
5.0000 mg | Freq: Four times a day (QID) | INTRAMUSCULAR | Status: DC
  Administered 2013-09-13 (×2): 5 mg via INTRAVENOUS
  Filled 2013-09-13 (×4): qty 1

## 2013-09-13 MED ORDER — VANCOMYCIN HCL IN DEXTROSE 1-5 GM/200ML-% IV SOLN
1000.0000 mg | INTRAVENOUS | Status: DC
  Administered 2013-09-13: 1000 mg via INTRAVENOUS
  Filled 2013-09-13 (×3): qty 200

## 2013-09-13 MED ORDER — METOCLOPRAMIDE HCL 5 MG/ML IJ SOLN
5.0000 mg | Freq: Four times a day (QID) | INTRAMUSCULAR | Status: AC
  Administered 2013-09-14 – 2013-09-16 (×11): 5 mg via INTRAVENOUS
  Filled 2013-09-13: qty 2
  Filled 2013-09-13: qty 1
  Filled 2013-09-13: qty 2
  Filled 2013-09-13 (×3): qty 1
  Filled 2013-09-13: qty 2
  Filled 2013-09-13 (×2): qty 1
  Filled 2013-09-13: qty 2
  Filled 2013-09-13 (×2): qty 1
  Filled 2013-09-13: qty 2
  Filled 2013-09-13 (×2): qty 1
  Filled 2013-09-13: qty 2

## 2013-09-13 MED ORDER — BISACODYL 10 MG RE SUPP
10.0000 mg | Freq: Every day | RECTAL | Status: AC
  Administered 2013-09-13 – 2013-09-14 (×2): 10 mg via RECTAL
  Filled 2013-09-13 (×2): qty 1

## 2013-09-13 MED ORDER — DEXTROSE 50 % IV SOLN
INTRAVENOUS | Status: AC
  Filled 2013-09-13: qty 50

## 2013-09-13 MED ORDER — PIPERACILLIN-TAZOBACTAM 3.375 G IVPB
3.3750 g | Freq: Three times a day (TID) | INTRAVENOUS | Status: DC
  Administered 2013-09-13 – 2013-09-15 (×6): 3.375 g via INTRAVENOUS
  Filled 2013-09-13 (×10): qty 50

## 2013-09-13 NOTE — Progress Notes (Signed)
Attempted report, RN unavailable. Phone number given for RN to call back.
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
## 2013-09-13 NOTE — Progress Notes (Signed)
PULMONARY / CRITICAL CARE MEDICINE   Name: [name] MRN: [id number] DOB: 1960-12-06    ADMISSION DATE:  09/07/2013 CONSULTATION DATE:  9/9  REFERRING MD :  Wynelle Cleveland   CHIEF COMPLAINT:  Hypotension   INITIAL PRESENTATION:  53yo male with hx HTN, CHF, DM admitted 9/4 by ortho for R BKA r/t R foot osteomyelitis after previous transmetatarsal amputations.  Required significant narcotics for pain control post op and developed ileus.  On 9/9 had worsening ileus/ abd pain, hypotension and acute renal failure and PCCM consulted.   STUDIES:  Renal u/s 9/9>>> no hydronephrosis   SIGNIFICANT EVENTS: 9/4 R BKA  9/9 ileus, worsening hypotension, acute renal failure, tx ICU  9/10 Much improved, transfer back to med-surg ordered  SUBJECTIVE:  No new complaints. No distress  VITAL SIGNS: Temp:  [97.8 F (36.6 C)-99 F (37.2 C)] 97.8 F (36.6 C) (09/10 1500) Pulse Rate:  [92-102] 92 (09/10 1600) Resp:  [15-27] 23 (09/10 1600) BP: (85-154)/(52-114) 117/64 mmHg (09/10 1300) SpO2:  [91 %-99 %] 92 % (09/10 1600) Weight:  [85.8 kg (189 lb 2.5 oz)] 85.8 kg (189 lb 2.5 oz) (09/10 1400) HEMODYNAMICS:   VENTILATOR SETTINGS:   INTAKE / OUTPUT:  Intake/Output Summary (Last 24 hours) at 09/13/13 1631 Last data filed at 09/13/13 1600  Gross per 24 hour  Intake   2680 ml  Output   3125 ml  Net   -445 ml    PHYSICAL EXAMINATION: General:  NAD Neuro:  Intact, LOC normal  HEENT:  WNL  Cardiovascular:  Reg, no M Lungs:  Clear Abdomen:  Distended, diminished BS, mildly tender diffusely  Musculoskeletal:  Warm and dry, R BKA wrapped, no edema    LABS: I have reviewed all of today's lab results. Relevant abnormalities are discussed in the A/P section  CXR: NNF  ASSESSMENT / PLAN:  PULMONARY ?aspiration - in setting emesis  P:   Supplemental O2  abx as below   CARDIOVASCULAR CVL R IJ CVL 9/9>>> Shock, resolved  Hx HTN  ?Hx dCHF- mild   P:  Monitor clinically Holding outpt  meds  RENAL Acute renal failure, nonoliguric and improving P:   Monitor BMET intermittently Monitor I/Os Correct electrolytes as indicated  GASTROINTESTINAL Ileus likely due to opioids and DM related GI dysmotility transaminitis  Vomiting, resolved P:   Cont NG tube to LIWS  F/u Abd xray intermittently Change metoclopramide to scheduled (5 mg IV q 6 hrs X 3 days) Daily dulcolax supp X 3 days Monitor LFTs intermittentely  HEMATOLOGIC Anemia  P:  DVT px: SQ heparin Monitor CBC intermittently Transfuse per usual ICU guidelines  INFECTIOUS Leukocytosis  Severe sepsis - likely gut translocation Elevated PCT Hx osteomyelitis  P:   BCx2 9/9>>> Sputum 9/9>>> vanc 9/9>>> Zosyn 9/9>>>    ENDOCRINE DM 2 Hypoglycemia, resolved  P:   No Lantus until taking nutrition Cont SSI    NEUROLOGIC AMS, resolved Post op pain P:   Minimize opioids  TODAY'S SUMMARY:   Transfer ack to ortho 9/10. PCCM will see again 9/11   Merton Border, MD ; [facility] [phone].  After 5:30 PM or weekends, call [phone]

## 2013-09-13 NOTE — Progress Notes (Signed)
eLink Physician-Brief Progress Note Patient Name: Traivon Juneau DOB: 02/13/1960 MRN: [id number]   Date of Service  09/13/2013  HPI/Events of Note  Mild hypoglycemia ileus On D5 1/2  Given 1/2 amp d50  eICU Interventions  Increase D5 1/2 NS to 125cc/hr     Intervention Category Intermediate Interventions: Medication change / dose adjustment  Aaiden Depoy 09/13/2013, 12:55 AM

## 2013-09-13 NOTE — Progress Notes (Signed)
Dressings are c/d/i. Patient is sitting up alert and oriented. Cr improving. Critical care per ICU. Appreciate assistance.  Azucena Cecil, MD [place] 8:02 AM

## 2013-09-13 NOTE — Progress Notes (Signed)
ANTIBIOTIC CONSULT NOTE - FOLLOW UP  Pharmacy Consult for Vancomycin, Zosyn Indication: rule out sepsis  No Known Allergies  Patient Measurements: Height: 6' (182.9 cm) Weight: 189 lb 2.5 oz (85.8 kg) IBW/kg (Calculated) : 77.6 Vital Signs: Temp: 99 F (37.2 C) (09/10 0822) Temp src: Oral (09/10 0822) BP: 132/65 mmHg (09/10 0800) Pulse Rate: 99 (09/10 0800) Intake/Output from previous day: 09/09 0701 - 09/10 0700 In: 3919.9 [I.V.:1219.9; IV Piggyback:2700] Out: 3200 [Urine:1900; Emesis/NG output:1300] Intake/Output from this shift: Total I/O In: 125 [I.V.:125] Out: 375 [Urine:375]  Labs:  Recent Labs  09/12/13 0451 09/12/13 0810 09/12/13 1015 09/12/13 1600 09/13/13 0500  WBC 15.2*  --  15.5* 7.4  --   HGB 8.5*  --  7.7* 7.2*  --   PLT 365  --  402* 330  --   LABCREA  --  137.25  --   --   --   CREATININE  --   --  5.48* 5.22* 3.74*   Estimated Creatinine Clearance: 25.1 ml/min (by C-G formula based on Cr of 3.74). No results found for this basename: VANCOTROUGH, Corlis Leak, VANCORANDOM, [place], GENTPEAK, GENTRANDOM, TOBRATROUGH, TOBRAPEAK, TOBRARND, AMIKACINPEAK, AMIKACINTROU, AMIKACIN,  in the last 72 hours   Microbiology: Recent Results (from the past 720 hour(s))  MRSA PCR SCREENING     Status: None   Collection Time    09/12/13  1:47 PM      Result Value Ref Range Status   MRSA by PCR NEGATIVE  NEGATIVE Final   Comment:            The GeneXpert MRSA Assay (FDA     approved for NASAL specimens     only), is one component of a     comprehensive MRSA colonization     surveillance program. It is not     intended to diagnose MRSA     infection nor to guide or     monitor treatment for     MRSA infections.  CULTURE, BLOOD (ROUTINE X 2)     Status: None   Collection Time    09/12/13  4:10 PM      Result Value Ref Range Status   Specimen Description BLOOD RIGHT ARM   Final   Special Requests BOTTLES DRAWN AEROBIC AND ANAEROBIC 10CC   Final   Culture  PENDING   Incomplete   Report Status PENDING   Incomplete  CULTURE, BLOOD (ROUTINE X 2)     Status: None   Collection Time    09/12/13  4:16 PM      Result Value Ref Range Status   Specimen Description BLOOD RIGHT HAND   Final   Special Requests BOTTLES DRAWN AEROBIC AND ANAEROBIC 10CC   Final   Culture PENDING   Incomplete   Report Status PENDING   Incomplete    Anti-infectives   Start     Dose/Rate Route Frequency Ordered Stop   09/14/13 1200  vancomycin (VANCOCIN) IVPB 1000 mg/200 mL premix  Status:  Discontinued     1,000 mg 200 mL/hr over 60 Minutes Intravenous Every 48 hours 09/12/13 1359 09/13/13 0855   09/13/13 1500  vancomycin (VANCOCIN) IVPB 1000 mg/200 mL premix     1,000 mg 200 mL/hr over 60 Minutes Intravenous Every 24 hours 09/13/13 0855     09/13/13 1400  piperacillin-tazobactam (ZOSYN) IVPB 3.375 g     3.375 g 12.5 mL/hr over 240 Minutes Intravenous 3 times per day 09/13/13 0855     09/12/13 2200  piperacillin-tazobactam (ZOSYN) IVPB 2.25 g  Status:  Discontinued     2.25 g 100 mL/hr over 30 Minutes Intravenous 3 times per day 09/12/13 1358 09/13/13 0855   09/12/13 1400  piperacillin-tazobactam (ZOSYN) IVPB 3.375 g     3.375 g 100 mL/hr over 30 Minutes Intravenous  Once 09/12/13 1355 09/12/13 1517   09/12/13 1400  vancomycin (VANCOCIN) 1,500 mg in sodium chloride 0.9 % 500 mL IVPB     1,500 mg 250 mL/hr over 120 Minutes Intravenous  Once 09/12/13 1358 09/12/13 1736   09/09/13 1345  vancomycin (VANCOCIN) 50 mg/mL oral solution 125 mg  Status:  Discontinued     125 mg Oral 4 times per day 09/09/13 1341 09/09/13 1342   09/08/13 0600  ceFAZolin (ANCEF) IVPB 2 g/50 mL premix     2 g 100 mL/hr over 30 Minutes Intravenous On call to O.R. 09/07/13 1450 09/07/13 1754   09/07/13 2300  ceFAZolin (ANCEF) IVPB 1 g/50 mL premix     1 g 100 mL/hr over 30 Minutes Intravenous Every 6 hours 09/07/13 1913 09/08/13 1118      Assessment: 43 YOM on day #2 IV vancomycin and Zosyn  for r/o sepsis. WBC 15.5>>7.5, afebrile. SCr 5.48>>3.74, est CrCl~ 25 mL/min. UOP improving (0.9 cc/kg/hr). Cultures still pending.   Hx of polymicrobial bacteremia (MRSA, Enterococcus, Proteus) and L-foot osteo on IV antibiotics for 12 wks (most recently Vancomycin + Ertapenem) then received 2 wks of Augmentin (last dose 9/3).   Goal of Therapy:  Vancomycin trough level 15-20 mcg/ml  Plan:  1. Increase Zosyn to 3.375g IV q8h- 4hr infusion 2. Increase Vancomycin to 1g IV q24h.  3. Monitor renal function and adjust dosing as needed. Vancomycin trough at steady state.  4. Monitor clinical status and culture results to narrow antibiotics as able.   Sloan Leiter, PharmD, BCPS Clinical Pharmacist [phone] 09/13/2013,8:56 AM

## 2013-09-13 NOTE — Progress Notes (Signed)
Report called to 5N RN 

## 2013-09-14 ENCOUNTER — Encounter (HOSPITAL_BASED_OUTPATIENT_CLINIC_OR_DEPARTMENT_OTHER): Payer: Medicaid Other | Attending: General Surgery

## 2013-09-14 DIAGNOSIS — I739 Peripheral vascular disease, unspecified: Secondary | ICD-10-CM

## 2013-09-14 LAB — BASIC METABOLIC PANEL
ANION GAP: 13 (ref 5–15)
BUN: 38 mg/dL — ABNORMAL HIGH (ref 6–23)
CO2: 28 meq/L (ref 19–32)
Calcium: 7.8 mg/dL — ABNORMAL LOW (ref 8.4–10.5)
Chloride: 96 mEq/L (ref 96–112)
Creatinine, Ser: 2.07 mg/dL — ABNORMAL HIGH (ref 0.50–1.35)
GFR calc Af Amer: 40 mL/min — ABNORMAL LOW (ref >90)
GFR calc non Af Amer: 35 mL/min — ABNORMAL LOW (ref >90)
Glucose, Bld: 111 mg/dL — ABNORMAL HIGH (ref 70–99)
POTASSIUM: 4.3 meq/L (ref 3.7–5.3)
SODIUM: 137 meq/L (ref 137–147)

## 2013-09-14 LAB — GLUCOSE, CAPILLARY
GLUCOSE-CAPILLARY: 114 mg/dL — AB (ref 70–99)
GLUCOSE-CAPILLARY: 116 mg/dL — AB (ref 70–99)
GLUCOSE-CAPILLARY: 150 mg/dL — AB (ref 70–99)
GLUCOSE-CAPILLARY: 74 mg/dL (ref 70–99)
Glucose-Capillary: 78 mg/dL (ref 70–99)
Glucose-Capillary: 80 mg/dL (ref 70–99)
Glucose-Capillary: 146 mg/dL — ABNORMAL HIGH (ref 70–99)

## 2013-09-14 LAB — CBC
HCT: 24 % — ABNORMAL LOW (ref 39.0–52.0)
HEMOGLOBIN: 7.6 g/dL — AB (ref 13.0–17.0)
MCH: 25.5 pg — ABNORMAL LOW (ref 26.0–34.0)
MCHC: 31.7 g/dL (ref 30.0–36.0)
MCV: 80.5 fL (ref 78.0–100.0)
Platelets: 399 10*3/uL (ref 150–400)
RBC: 2.98 MIL/uL — ABNORMAL LOW (ref 4.22–5.81)
RDW: 15.6 % — ABNORMAL HIGH (ref 11.5–15.5)
WBC: 6.6 10*3/uL (ref 4.0–10.5)

## 2013-09-14 LAB — PROCALCITONIN
PROCALCITONIN: 1.61 ng/mL
PROCALCITONIN: 1.67 ng/mL

## 2013-09-14 MED ORDER — ASPIRIN 325 MG PO TABS
325.0000 mg | ORAL_TABLET | Freq: Every day | ORAL | Status: DC
  Administered 2013-09-15 – 2013-09-16 (×2): 325 mg via ORAL
  Filled 2013-09-14 (×3): qty 1

## 2013-09-14 MED ORDER — VANCOMYCIN HCL 10 G IV SOLR
1250.0000 mg | INTRAVENOUS | Status: DC
  Administered 2013-09-14: 1250 mg via INTRAVENOUS
  Filled 2013-09-14 (×3): qty 1250

## 2013-09-14 MED ORDER — CARVEDILOL 3.125 MG PO TABS
3.1250 mg | ORAL_TABLET | Freq: Two times a day (BID) | ORAL | Status: DC
  Administered 2013-09-14 – 2013-09-18 (×8): 3.125 mg via ORAL
  Filled 2013-09-14 (×10): qty 1

## 2013-09-14 NOTE — Progress Notes (Signed)
ANTIBIOTIC CONSULT NOTE - FOLLOW UP  Pharmacy Consult for Vancomycin, Zosyn Indication: rule out sepsis  No Known Allergies  Patient Measurements: Height: 6' (182.9 cm) Weight: 189 lb 2.5 oz (85.8 kg) IBW/kg (Calculated) : 77.6 Vital Signs: Temp: 98.6 F (37 C) (09/11 0945) Temp src: Oral (09/11 0945) BP: 157/74 mmHg (09/11 0945) Pulse Rate: 96 (09/11 0945) Intake/Output from previous day: 09/10 0701 - 09/11 0700 In: 3193.8 [I.V.:2943.8; IV Piggyback:250] Out: 2125 [Urine:1125; Emesis/NG output:1000] Intake/Output from this shift: Total I/O In: 20 [I.V.:20] Out: -   Labs:  Recent Labs  09/12/13 0810 09/12/13 1015 09/12/13 1600 09/13/13 0500 09/14/13 0604  WBC  --  15.5* 7.4  --  6.6  HGB  --  7.7* 7.2*  --  7.6*  PLT  --  402* 330  --  399  LABCREA 137.25  --   --   --   --   CREATININE  --  5.48* 5.22* 3.74* 2.07*   Estimated Creatinine Clearance: 45.3 ml/min (by C-G formula based on Cr of 2.07). No results found for this basename: VANCOTROUGH, Corlis Leak, VANCORANDOM, [place], GENTPEAK, GENTRANDOM, TOBRATROUGH, TOBRAPEAK, TOBRARND, AMIKACINPEAK, AMIKACINTROU, AMIKACIN,  in the last 72 hours   Microbiology: Recent Results (from the past 720 hour(s))  MRSA PCR SCREENING     Status: None   Collection Time    09/12/13  1:47 PM      Result Value Ref Range Status   MRSA by PCR NEGATIVE  NEGATIVE Final   Comment:            The GeneXpert MRSA Assay (FDA     approved for NASAL specimens     only), is one component of a     comprehensive MRSA colonization     surveillance program. It is not     intended to diagnose MRSA     infection nor to guide or     monitor treatment for     MRSA infections.  URINE CULTURE     Status: None   Collection Time    09/12/13  3:40 PM      Result Value Ref Range Status   Specimen Description URINE, CATHETERIZED   Final   Special Requests NONE   Final   Culture  Setup Time     Final   Value: 09/12/2013 20:36     Performed  at [place]     Final   Value: NO GROWTH     Performed at [facility]   Culture     Final   Value: NO GROWTH     Performed at [facility]   Report Status 09/13/2013 FINAL   Final  CULTURE, BLOOD (ROUTINE X 2)     Status: None   Collection Time    09/12/13  4:10 PM      Result Value Ref Range Status   Specimen Description BLOOD RIGHT ARM   Final   Special Requests BOTTLES DRAWN AEROBIC AND ANAEROBIC 10CC   Final   Culture  Setup Time     Final   Value: 09/12/2013 20:32     Performed at [facility]   Culture     Final   Value:        BLOOD CULTURE RECEIVED NO GROWTH TO DATE CULTURE WILL BE HELD FOR 5 DAYS BEFORE ISSUING A FINAL NEGATIVE REPORT     Performed at [facility]   Report Status PENDING  Incomplete  CULTURE, BLOOD (ROUTINE X 2)     Status: None   Collection Time    09/12/13  4:16 PM      Result Value Ref Range Status   Specimen Description BLOOD RIGHT HAND   Final   Special Requests BOTTLES DRAWN AEROBIC AND ANAEROBIC 10CC   Final   Culture  Setup Time     Final   Value: 09/12/2013 20:32     Performed at [facility]   Culture     Final   Value:        BLOOD CULTURE RECEIVED NO GROWTH TO DATE CULTURE WILL BE HELD FOR 5 DAYS BEFORE ISSUING A FINAL NEGATIVE REPORT     Performed at [facility]   Report Status PENDING   Incomplete    Anti-infectives   Start     Dose/Rate Route Frequency Ordered Stop   09/14/13 1500  vancomycin (VANCOCIN) 1,250 mg in sodium chloride 0.9 % 250 mL IVPB     1,250 mg 166.7 mL/hr over 90 Minutes Intravenous Every 24 hours 09/14/13 1319     09/14/13 1200  vancomycin (VANCOCIN) IVPB 1000 mg/200 mL premix  Status:  Discontinued     1,000 mg 200 mL/hr over 60 Minutes Intravenous Every 48 hours 09/12/13 1359 09/13/13 0855   09/13/13 1500  vancomycin (VANCOCIN) IVPB 1000 mg/200 mL premix  Status:  Discontinued     1,000 mg 200 mL/hr over 60 Minutes Intravenous Every  24 hours 09/13/13 0855 09/14/13 1319   09/13/13 1400  piperacillin-tazobactam (ZOSYN) IVPB 3.375 g     3.375 g 12.5 mL/hr over 240 Minutes Intravenous 3 times per day 09/13/13 0855     09/12/13 2200  piperacillin-tazobactam (ZOSYN) IVPB 2.25 g  Status:  Discontinued     2.25 g 100 mL/hr over 30 Minutes Intravenous 3 times per day 09/12/13 1358 09/13/13 0855   09/12/13 1400  piperacillin-tazobactam (ZOSYN) IVPB 3.375 g     3.375 g 100 mL/hr over 30 Minutes Intravenous  Once 09/12/13 1355 09/12/13 1517   09/12/13 1400  vancomycin (VANCOCIN) 1,500 mg in sodium chloride 0.9 % 500 mL IVPB     1,500 mg 250 mL/hr over 120 Minutes Intravenous  Once 09/12/13 1358 09/12/13 1736   09/09/13 1345  vancomycin (VANCOCIN) 50 mg/mL oral solution 125 mg  Status:  Discontinued     125 mg Oral 4 times per day 09/09/13 1341 09/09/13 1342   09/08/13 0600  ceFAZolin (ANCEF) IVPB 2 g/50 mL premix     2 g 100 mL/hr over 30 Minutes Intravenous On call to O.R. 09/07/13 1450 09/07/13 1754   09/07/13 2300  ceFAZolin (ANCEF) IVPB 1 g/50 mL premix     1 g 100 mL/hr over 30 Minutes Intravenous Every 6 hours 09/07/13 1913 09/08/13 1118      Assessment: 57 YOM on day #3 IV vancomycin and Zosyn for r/o sepsis. WBC 15.5>>7.5>6.6, afebrile. SCr 5.48>>3.74>2.07, est CrCl~ 45 mL/min. UOP improving  Cultures still pending.   Hx of polymicrobial bacteremia (MRSA, Enterococcus, Proteus) and L-foot osteo on IV antibiotics for 12 wks (most recently Vancomycin + Ertapenem) then received 2 wks of Augmentin (last dose 9/3).   Goal of Therapy:  Vancomycin trough level 15-20 mcg/ml  Plan:  1. Continue  Zosyn to 3.375g IV q8h- 4hr infusion 2. Increase Vancomycin to 1250 mg IV q24h.  3. Monitor renal function and adjust dosing as needed. Vancomycin trough at steady state.  4. Monitor clinical status and  culture results to narrow antibiotics as able.   Thank you Anette Guarneri, PharmD [phone]  09/14/2013,1:20 PM

## 2013-09-14 NOTE — Progress Notes (Signed)
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  Required significant narcotics for pain control post op and developed ileus.  On 9/9 had worsening ileus/ abd pain, hypotension and acute renal failure and PCCM consulted.   STUDIES:  Renal u/s 9/9>>> no hydronephrosis   SIGNIFICANT EVENTS: 9/4 R BKA  9/9 ileus, worsening hypotension, acute renal failure, tx ICU  9/10 Much improved, transfer back to med-surg ordered 9/11 No distress, NGT remains  SUBJECTIVE:  Pt reports discomfort from NGT, hopeful for it to come out soon Abd pain decreased  VITAL SIGNS: Temp:  [97.8 F (36.6 C)-99 F (37.2 C)] 98.6 F (37 C) (09/11 0945) Pulse Rate:  [92-102] 96 (09/11 0945) Resp:  [15-26] 20 (09/11 0945) BP: (117-161)/(64-75) 157/74 mmHg (09/11 0945) SpO2:  [92 %-97 %] 97 % (09/11 0945) Weight:  [189 lb 2.5 oz (85.8 kg)] 189 lb 2.5 oz (85.8 kg) (09/10 1400)  INTAKE / OUTPUT:  Intake/Output Summary (Last 24 hours) at 09/14/13 1056 Last data filed at 09/14/13 0900  Gross per 24 hour  Intake 2818.75 ml  Output   1425 ml  Net 1393.75 ml    PHYSICAL EXAMINATION: General:  NAD Neuro:  Intact, LOC normal  HEENT:  WNL  Cardiovascular:  Reg, no M Lungs:  Clear Abdomen:  Distended, diminished BS, mildly tender diffusely  Musculoskeletal:  Warm and dry, R BKA wrapped, no edema    LABS: Reviewed all of today's lab results. Relevant abnormalities are discussed in the A/P section  CXR: NNF  ASSESSMENT / PLAN:  PULMONARY ?aspiration - in setting emesis  P:   Supplemental O2  abx as below  Pulmonary hygiene  CARDIOVASCULAR CVL R IJ CVL 9/9>>> Shock, resolved  Hx HTN  ?Hx  dCHF- mild   P:  Monitor clinically Resume Coreg 9/11  Consider restart norvasc 9/12 pending BP trend review  RENAL Acute renal failure, nonoliguric and improving P:   Monitor BMET intermittently Monitor I/Os Correct electrolytes as indicated  GASTROINTESTINAL Ileus likely due to opioids and DM related GI dysmotility Transaminitis  Vomiting, resolved P:   Cont NG tube to LIWS  F/u Abd xray intermittently Metoclopramide to scheduled (5 mg IV q 6 hrs X 3 days) Daily dulcolax supp X 3 days Monitor LFTs intermittentely  HEMATOLOGIC Anemia  P:  DVT px: SQ heparin Monitor CBC intermittently Transfuse for hgb <7%, active bleeding or MI <8%  INFECTIOUS Leukocytosis  Severe sepsis - likely gut translocation Elevated PCT Hx osteomyelitis  P:   BCx2 9/9>>>ng Sputum 9/9>>>  vanc 9/9>>> Zosyn 9/9>>>    ENDOCRINE DM 2 Hypoglycemia, resolved  P:   No Lantus until taking nutrition Cont SSI    NEUROLOGIC AMS, resolved Post op pain P:   Minimize opioids  TODAY'S SUMMARY:  53 y/o M admitted for R amputation in setting of osteomyelitis.  Hospital course complicated by AKI, Ileus (resolving) and sepsis requiring tx to ICU 9/9 PM.  Transfer back to ortho floor 9/10. PCCM will transfer medical care back to [facility] as of 9/12 and primary SVC back to Dr. Sharol Given.    Noe Gens, NP-C [facility] Pgr: [phone] or [phone]  Independently  examined pt, evaluated data & formulated above care plan with NP who scribed this note & edited by me.  Rigoberto Noel MD

## 2013-09-14 NOTE — Evaluation (Signed)
Physical Therapy Re-Evaluation Patient Details Name: Zarion Hinsdale MRN: [id number] DOB: April 18, 1960 Today's Date: 09/14/2013   History of Present Illness  Erron Mandala is a 53 y.o. 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 Pt has previous Lt midfoot amputation. 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 PMH of HTN, osteomyelitis, diabetic neuropathy, PAD/PVD, DM, and CHF. On 9/9 had worsening ileus/ abd pain, hypotension and acute renal failure; transfered to ICU. Pt has since returned to orthopedic unit.  Clinical Impression  Pt re-evaluated following the above procedure and recent complications. He is currently with functional limitations due to the deficits listed below (see PT Problem List). Moderate decline in patient's strength and functional mobility since initial PT evaluation. Currently at [place] assist for mobility. Feel patient may benefit from short term CIR stay in order to return to prior level of function.  Pt will benefit from skilled PT to increase their independence and safety with mobility. Will continue to follow acutely.     Follow Up Recommendations CIR    Equipment Recommendations  3in1 (PT)    Recommendations for Other Services Rehab consult;OT consult     Precautions / Restrictions Precautions Precautions: Other (comment);Fall Precaution Comments: educated pt on no pillow behind knee Restrictions Weight Bearing Restrictions: Yes RLE Weight Bearing: Non weight bearing      Mobility  Bed Mobility Overal bed mobility: Needs Assistance Bed Mobility: Supine to Sit   Sidelying to sit: Min guard;HOB elevated       General bed mobility comments: Min guard for safety with VC for technique. Dizziness upon sitting position, resolved within 2 minutes.  Transfers Overall transfer level: Needs assistance Equipment used: Rolling walker (2 wheeled) Transfers: Sit to/from [facility] Sit to Stand: Mod assist Stand  pivot transfers: Min assist       General transfer comment: Mod assist for boost from elevated bed surface with some loss of balance to posterior. Required 2 attempts due to LLE weakness. Some dizziness reported during pivot transfer. Min assist for walker control and stability and balance. Lt knee buckling intermittently but good UE strength through RW. Required assist to safely sit in chair due to poor control.  Ambulation/Gait                Stairs            Wheelchair Mobility    Modified Rankin (Stroke Patients Only)       Balance                                             Pertinent Vitals/Pain Pain Assessment: 0-10 Pain Score:  ("only a little pain in my right leg") Pain Location: back, right residual limb, and stomach Pain Intervention(s): Limited activity within patient's tolerance;Monitored during session;Repositioned    Home Living Family/patient expects to be discharged to:: Private residence Living Arrangements: Alone Available Help at Discharge: Family;Available PRN/intermittently Type of Home: House Home Access: Stairs to enter Entrance Stairs-Rails: Left Entrance Stairs-Number of Steps: 1 small step Home Layout: Two level;1/2 bath on main level;Bed/bath upstairs Home Equipment: Wheelchair - Water quality scientist - 2 wheels Additional Comments: Son stay with pt at d/c - daughter available intermittently    Prior Function Level of Independence: Independent with assistive device(s)         Comments: Pt was using RW for  safety with ambulation. He was independent with ADLs.     Hand Dominance   Dominant Hand: Right    Extremity/Trunk Assessment   Upper Extremity Assessment: Defer to OT evaluation           Lower Extremity Assessment: Generalized weakness RLE Deficits / Details: Rt transtibial amputation. Decreased strength and ROM as expected post op  LLE Deficits / Details: Hx of left transtarsal amputation.  Decreased functional strength since initial evaluation     Communication   Communication: No difficulties  Cognition Arousal/Alertness: Awake/alert Behavior During Therapy: WFL for tasks assessed/performed Overall Cognitive Status: Within Functional Limits for tasks assessed                      General Comments      Exercises Amputee Exercises Quad Sets: 10 reps;Seated;Both;Strengthening;AROM Knee Flexion: AROM;Right;Seated;15 reps Knee Extension: AROM;Right;Seated;15 reps Other Exercises Other Exercises: Sitting balance activites at edge of bed. reaching within base of support for trunk activation and stability training.      Assessment/Plan    PT Assessment Patient needs continued PT services  PT Diagnosis Acute pain;Generalized weakness;Difficulty walking   PT Problem List Decreased strength;Decreased range of motion;Decreased activity tolerance;Decreased balance;Decreased mobility;Decreased knowledge of use of DME;Decreased knowledge of precautions;Pain  PT Treatment Interventions DME instruction;Gait training;Functional mobility training;Therapeutic activities;Therapeutic exercise;Balance training;Neuromuscular re-education;Patient/family education;Modalities;Stair training   PT Goals (Current goals can be found in the Care Plan section) Acute Rehab PT Goals Patient Stated Goal: Get better PT Goal Formulation: With patient Time For Goal Achievement: 09/21/13 Potential to Achieve Goals: Good    Frequency Min 5X/week   Barriers to discharge Decreased caregiver support Unsure if pt will have 24 hour care at home    Co-evaluation               End of Session   Activity Tolerance: Patient limited by fatigue Patient left: with call bell/phone within reach;in chair Nurse Communication: Mobility status         Time: [id number] PT Time Calculation (min): 37 min   Charges:   PT Evaluation $PT Re-evaluation: 1 Procedure PT Treatments $Therapeutic  Activity: 8-22 mins   PT G Codes:        [name], [facility]   [name] 09/14/2013, 4:00 PM

## 2013-09-15 ENCOUNTER — Inpatient Hospital Stay (HOSPITAL_COMMUNITY): Payer: Medicaid Other

## 2013-09-15 DIAGNOSIS — E1159 Type 2 diabetes mellitus with other circulatory complications: Secondary | ICD-10-CM

## 2013-09-15 DIAGNOSIS — I96 Gangrene, not elsewhere classified: Secondary | ICD-10-CM

## 2013-09-15 DIAGNOSIS — L97509 Non-pressure chronic ulcer of other part of unspecified foot with unspecified severity: Secondary | ICD-10-CM

## 2013-09-15 DIAGNOSIS — E1169 Type 2 diabetes mellitus with other specified complication: Secondary | ICD-10-CM

## 2013-09-15 LAB — GLUCOSE, CAPILLARY
GLUCOSE-CAPILLARY: 157 mg/dL — AB (ref 70–99)
GLUCOSE-CAPILLARY: 152 mg/dL — AB (ref 70–99)
GLUCOSE-CAPILLARY: 197 mg/dL — AB (ref 70–99)
Glucose-Capillary: 146 mg/dL — ABNORMAL HIGH (ref 70–99)
Glucose-Capillary: 159 mg/dL — ABNORMAL HIGH (ref 70–99)
Glucose-Capillary: 177 mg/dL — ABNORMAL HIGH (ref 70–99)
Glucose-Capillary: 182 mg/dL — ABNORMAL HIGH (ref 70–99)
Glucose-Capillary: 265 mg/dL — ABNORMAL HIGH (ref 70–99)

## 2013-09-15 LAB — CBC
HCT: 23.9 % — ABNORMAL LOW (ref 39.0–52.0)
HEMOGLOBIN: 7.5 g/dL — AB (ref 13.0–17.0)
MCH: 25.5 pg — AB (ref 26.0–34.0)
MCHC: 31.4 g/dL (ref 30.0–36.0)
MCV: 81.3 fL (ref 78.0–100.0)
Platelets: 391 10*3/uL (ref 150–400)
RBC: 2.94 MIL/uL — ABNORMAL LOW (ref 4.22–5.81)
RDW: 15.5 % (ref 11.5–15.5)
WBC: 8.8 10*3/uL (ref 4.0–10.5)

## 2013-09-15 LAB — BASIC METABOLIC PANEL
Anion gap: 12 (ref 5–15)
BUN: 27 mg/dL — AB (ref 6–23)
CO2: 25 meq/L (ref 19–32)
Calcium: 8.1 mg/dL — ABNORMAL LOW (ref 8.4–10.5)
Chloride: 102 mEq/L (ref 96–112)
Creatinine, Ser: 1.49 mg/dL — ABNORMAL HIGH (ref 0.50–1.35)
GFR calc Af Amer: 60 mL/min — ABNORMAL LOW (ref >90)
GFR calc non Af Amer: 52 mL/min — ABNORMAL LOW (ref >90)
Glucose, Bld: 155 mg/dL — ABNORMAL HIGH (ref 70–99)
Potassium: 4.7 mEq/L (ref 3.7–5.3)
Sodium: 139 mEq/L (ref 137–147)

## 2013-09-15 MED ORDER — HYDRALAZINE HCL 20 MG/ML IJ SOLN: 10.0000 mg | Freq: Four times a day (QID) | INTRAMUSCULAR | Status: DC | PRN

## 2013-09-15 NOTE — Progress Notes (Signed)
Patient tolerated NG tube discontinue well.  Tolerated clear liquid diet well also.  MD aware.

## 2013-09-15 NOTE — Progress Notes (Signed)
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 Required significant narcotics for pain control post op and developed ileus. On 9/9 had worsening ileus/ abd pain, hypotension and acute renal failure and PCCM consulted.  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  - As the patient to sit up more in the chair, continue bowel regimen     Diabetes mellitus type 2, uncontrolled, with complications - Continue sliding scale insulin, once on diet, will titrate insulin regimen    HYPERTENSION -Currently stable, will restart Coreg, amlodipine once on diet - hydralazine PRN    Anemia - H&H low, 7.5, defer to primary team for transfusion    Severe sepsis(995.92)- likely gut translocation, history of osteomyelitis - Currently stable - Patient was on vancomycin and Zosyn, discontinued by CCM on 9/11 - Afebrile, no leukocytosis, procalcitonin improving   DVT Prophylaxis:SCDs   Code Status:Full code   Family Communication:  Disposition:Per primary team      Subjective: Patient seen and examined, multiple bowel movements yesterday and today, uncomfortable with NG tube, no nausea or vomiting or abdominal  pain, still has abdominal distention   Objective: Weight change: -0.5 kg (-1 lb 1.6 oz)  Intake/Output Summary (Last 24 hours) at 09/15/13 1105 Last data filed at 09/15/13 0907  Gross per 24 hour  Intake 3576.25 ml  Output   3052 ml  Net 524.25 ml   Blood pressure 150/67, pulse 96, temperature 97.8 F (36.6 C), temperature source Oral, resp. rate 18, height 6' (1.829 m), weight 85.3 kg (188 lb 0.8 oz), SpO2 99.00%.  Physical Exam: General: Alert and awake, oriented x3, not in any acute distress. CVS: S1-S2 clear, no murmur rubs or gallops Chest: clear to auscultation bilaterally, no wheezing, rales or rhonchi Abdomen: soft nontender,  + nondistended, + BS  Extremities: no cyanosis, clubbing or edema noted bilaterally   Lab Results: Basic Metabolic Panel:  Recent Labs Lab 09/11/13 1650  09/14/13 0604 09/15/13 0346  NA 136*  < > 137 139  K 5.0  < > 4.3 4.7  CL 88*  < > 96 102  CO2 30  < > 28 25  GLUCOSE 213*  < > 111* 155*  BUN 43*  < > 38* 27*  CREATININE 3.62*  < > 2.07* 1.49*  CALCIUM 9.3  < > 7.8* 8.1*  MG 2.6*  --   --   --   < > =  values in this interval not displayed. Liver Function Tests:  Recent Labs Lab 09/12/13 1015  AST 72*  ALT 64*  ALKPHOS 245*  BILITOT 0.5  PROT 8.1  ALBUMIN 2.4*    Recent Labs Lab 09/12/13 1600  LIPASE 11  AMYLASE 31   No results found for this basename: AMMONIA,  in the last 168 hours CBC:  Recent Labs Lab 09/14/13 0604 09/15/13 0346  WBC 6.6 8.8  HGB 7.6* 7.5*  HCT 24.0* 23.9*  MCV 80.5 81.3  PLT 399 391   Cardiac Enzymes:  Recent Labs Lab 09/12/13 1338 09/12/13 1851 09/13/13 0205  TROPONINI <0.30 <0.30 <0.30   BNP: No components found with this basename: POCBNP,  CBG:  Recent Labs Lab 09/14/13 2031 09/14/13 2346 09/15/13 0410 09/15/13 0636 09/15/13 0827  GLUCAP 146* 159* 157* 152* 146*     Micro Results: Recent Results (from the past 240 hour(s))  MRSA PCR SCREENING     Status: None    Collection Time    09/12/13  1:47 PM      Result Value Ref Range Status   MRSA by PCR NEGATIVE  NEGATIVE Final   Comment:            The GeneXpert MRSA Assay (FDA     approved for NASAL specimens     only), is one component of a     comprehensive MRSA colonization     surveillance program. It is not     intended to diagnose MRSA     infection nor to guide or     monitor treatment for     MRSA infections.  URINE CULTURE     Status: None   Collection Time    09/12/13  3:40 PM      Result Value Ref Range Status   Specimen Description URINE, CATHETERIZED   Final   Special Requests NONE   Final   Culture  Setup Time     Final   Value: 09/12/2013 20:36     Performed at [facility]     Final   Value: NO GROWTH     Performed at [facility]   Culture     Final   Value: NO GROWTH     Performed at [facility]   Report Status 09/13/2013 FINAL   Final  CULTURE, BLOOD (ROUTINE X 2)     Status: None   Collection Time    09/12/13  4:10 PM      Result Value Ref Range Status   Specimen Description BLOOD RIGHT ARM   Final   Special Requests BOTTLES DRAWN AEROBIC AND ANAEROBIC 10CC   Final   Culture  Setup Time     Final   Value: 09/12/2013 20:32     Performed at [facility]   Culture     Final   Value:        BLOOD CULTURE RECEIVED NO GROWTH TO DATE CULTURE WILL BE HELD FOR 5 DAYS BEFORE ISSUING A FINAL NEGATIVE REPORT     Performed at [facility]   Report Status PENDING   Incomplete  CULTURE, BLOOD (ROUTINE X 2)     Status: None   Collection Time    09/12/13  4:16 PM      Result Value Ref Range Status   Specimen Description BLOOD RIGHT HAND   Final   Special Requests BOTTLES DRAWN AEROBIC AND ANAEROBIC 10CC   Final  Culture  Setup Time     Final   Value: 09/12/2013 20:32     Performed at [facility]   Culture     Final   Value:        BLOOD CULTURE RECEIVED NO GROWTH TO DATE CULTURE WILL BE HELD FOR 5 DAYS BEFORE  ISSUING A FINAL NEGATIVE REPORT     Performed at [facility]   Report Status PENDING   Incomplete    Studies/Results: Dg Chest 2 View  09/07/2013   CLINICAL DATA:  Preoperative prior to right leg amputation; history of diabetes, hypertension, and previous tobacco use.  EXAM: CHEST  2 VIEW  COMPARISON:  PA and lateral chest of August 30, 2013  FINDINGS: The lungs are adequately inflated and clear. The heart and pulmonary vascularity are normal. The mediastinum is normal in width. There is no pleural effusion. The bony thorax is unremarkable.  IMPRESSION: There is no active cardiopulmonary disease.   Electronically Signed   By: David  Martinique   On: 09/07/2013 15:32   Dg Chest 2 View  08/31/2013   CLINICAL DATA:  Several day history of cough and congestion  EXAM: CHEST  2 VIEW  COMPARISON:  .  PA and lateral chest x-ray of May 11, 2013  FINDINGS: The lungs are adequately inflated. There is is increased density posteriorly on the lateral film which may lie on the right at and just below the medial aspect of the hemidiaphragm on the frontal film. The cardiac silhouette and pulmonary vascularity are normal. There is no pleural effusion or pneumothorax. The mediastinum is normal in width. The bony thorax is unremarkable.  IMPRESSION: There is increased density presumably in the right lower lobe posteriorly consistent with atelectasis or focal pneumonia. Overlap of normal bony structures may be contributing to the density. Followup films following therapy are recommended to assure complete clearing.   Electronically Signed   By: David  Martinique   On: 08/31/2013 08:16   Dg Abd 1 View  09/10/2013   CLINICAL DATA:  Postop nausea, vomiting and constipation. Stabbing right upper quadrant pain.  EXAM: ABDOMEN - 1 VIEW  COMPARISON:  05/10/2013.  FINDINGS: Stool is seen throughout the colon. Single dilated loop of small bowel seen in the left abdomen, nonspecific. Atherosclerotic calcification of the arterial  vasculature.  IMPRESSION: Bowel gas pattern is indicative of constipation.   Electronically Signed   By: Lorin Picket M.D.   On: 09/10/2013 15:27   US Renal  09/12/2013   CLINICAL DATA:  Acute renal failure.  EXAM: RENAL/URINARY TRACT ULTRASOUND COMPLETE  COMPARISON:  None.  FINDINGS: Right Kidney:  Length: 11.0 cm. Echogenicity within normal limits. No mass or hydronephrosis visualized.  Left Kidney:  Length: 11.1 cm. Suboptimally visualized due to adjacent bowel, limited acoustic window, and maceration. No hydronephrosis or gross abnormality identified.  Bladder:  Decompressed by Foley catheter.  IMPRESSION: No hydronephrosis. Unremarkable right and grossly unremarkable left kidneys.   Electronically Signed   By: Logan Bores   On: 09/12/2013 09:33   Dg Chest Port 1 View  09/12/2013   CLINICAL DATA:  Central line inserted, check position  EXAM: PORTABLE CHEST - 1 VIEW  COMPARISON:  09/07/2013  FINDINGS: Limited inspiratory effect. Enlargement of cardiac silhouette. NG tube extends into the stomach. Right internal jugular central line has been placed with tip to the cavoatrial junction. No pneumothorax. Bilateral vascular congestion.  IMPRESSION: Central line as described with no pneumothorax.   Electronically Signed  By: Skipper Cliche M.D.   On: 09/12/2013 14:44   Dg Abd Portable 1v  09/15/2013   CLINICAL DATA:  Ileus  EXAM: PORTABLE ABDOMEN - 1 VIEW  COMPARISON:  09/12/2013  FINDINGS: NG tube identified within the stomach. There is central abdominal small bowel dilatation. Maximal diameter is 3.8 cm, decreased from 4.4 cm previously. There is little gas in the colon with a small amount of air in the rectum. The bladder is mildly distended.  IMPRESSION: Improving small bowel dilatation. Minimal gas into the colon. Improving small bowel ileus suspected, although low-grade partial small bowel obstruction not excluded.   Electronically Signed   By: Skipper Cliche M.D.   On: 09/15/2013 08:52   Dg Abd  Portable 1v  09/12/2013   ADDENDUM REPORT: 09/12/2013 08:54  ADDENDUM: These results will be called to the ordering clinician or representative by the Radiologist Assistant, and communication documented in the PACS or zVision Dashboard.   Electronically Signed   By: Logan Bores   On: 09/12/2013 08:54   09/12/2013   CLINICAL DATA:  Abdominal distension.  EXAM: PORTABLE ABDOMEN - 1 VIEW  COMPARISON:  09/10/2013  FINDINGS: Stool is again seen throughout the colon. Gas is present in the sigmoid colon and rectum. Multiple loops of gas-filled, dilated small bowel are present in the central abdomen measuring up to 4.4 cm in diameter, increased from the prior study. No gross intraperitoneal free air on the supine images.  IMPRESSION: Increasing gaseous small bowel dilatation with persistent stool throughout the colon and distal colonic gas. Findings are suggestive of worsening postoperative ileus, although a developing small bowel obstruction is not excluded.  Electronically Signed: By: Logan Bores On: 09/12/2013 08:43    Medications: Scheduled Meds: . antiseptic oral rinse  7 mL Mouth Rinse q12n4p  . aspirin  325 mg Oral Daily  . bisacodyl  10 mg Rectal Daily  . carvedilol  3.125 mg Oral BID WC  . chlorhexidine  15 mL Mouth Rinse BID  . insulin aspart  0-9 Units Subcutaneous 6 times per day  . metoCLOPramide (REGLAN) injection  5 mg Intravenous 4 times per day  . pantoprazole (PROTONIX) IV  40 mg Intravenous QHS  . sodium chloride  10-40 mL Intracatheter Q12H      LOS: 8 days   Carel Schnee M.D. [facility] 09/15/2013, 11:05 AM Pager: [phone]  If 7PM-7AM, please contact night-coverage [email or web address] Password [email or web address]  **Disclaimer: This note was dictated with voice recognition software. Similar sounding words can inadvertently be transcribed and this note may contain transcription errors which may not have been corrected upon publication of note.**

## 2013-09-15 NOTE — Progress Notes (Signed)
Patient ID: [name], male   DOB: [birth year], 53 y.o.   MRN: [id number] Labs appear to be trending in the right direct.  H/H still low, but vitals stable.  He is still npo due to his ileus. His WBC is normal now, so can likely stop the antibiotics.  Will repeat labs in the am.

## 2013-09-15 NOTE — Progress Notes (Signed)
Physical Therapy Treatment Patient Details Name: [name] MRN: [id number] DOB: 06-Aug-1960 Today's Date: 09/15/2013    History of Present Illness [name] is a 53 y.o. 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 Pt has previous Lt midfoot amputation. 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 PMH of HTN, osteomyelitis, diabetic neuropathy, PAD/PVD, DM, and CHF. On 9/9 had worsening ileus/ abd pain, hypotension and acute renal failure; transfered to ICU. Pt has since returned to orthopedic unit.    PT Comments    Patient progressing towards physical therapy goals, ambulating short distance today with min assist; still limited by general weakness and fatigue. Motivated to improve and reports he has been compliant with therapeutic exercises. Patient will continue to benefit from skilled physical therapy services to further improve independence with functional mobility.   Follow Up Recommendations  CIR     Equipment Recommendations  3in1 (PT)    Recommendations for Other Services Rehab consult;OT consult     Precautions / Restrictions Precautions Precautions: Fall Restrictions Weight Bearing Restrictions: Yes RLE Weight Bearing: Non weight bearing    Mobility  Bed Mobility Overal bed mobility: Needs Assistance Bed Mobility: Supine to Sit   Sidelying to sit: Supervision       General bed mobility comments: Supervision for safety. VC for technique. Uses rail.  Transfers Overall transfer level: Needs assistance Equipment used: Rolling walker (2 wheeled) Transfers: Sit to/from Stand Sit to Stand: Min assist;From elevated surface         General transfer comment: Min assist for balance upon standing. LLE with minor buckling however able to self support with UEs to correct. VC for hand placement. Bed slightly elevated.  Ambulation/Gait Ambulation/Gait assistance: Min assist Ambulation Distance (Feet): 15 Feet Assistive device: Rolling  walker (2 wheeled) Gait Pattern/deviations:  ("hop-to" pattern)   Gait velocity interpretation: Below normal speed for age/gender General Gait Details: Educated on safe DME use with rolling walker, Min assist with turns for walker placement but min guard with forward stepping. Some minor buckling of Lt knee but able to self correct. VC for walker placement intermittently.   Stairs            Wheelchair Mobility    Modified Rankin (Stroke Patients Only)       Balance                                    Cognition Arousal/Alertness: Awake/alert Behavior During Therapy: WFL for tasks assessed/performed Overall Cognitive Status: Within Functional Limits for tasks assessed                      Exercises Amputee Exercises Quad Sets: 10 reps;Seated;Both;Strengthening Hip Extension: Right;AROM;10 reps;Standing Hip Flexion/Marching: AROM;Right;10 reps;Seated Knee Flexion: AROM;Right;Seated;10 reps Knee Extension: AROM;Right;Seated;10 reps    General Comments        Pertinent Vitals/Pain Pain Assessment: 0-10 Pain Score:  ("doing a little better" no value given) Pain Location: back Pain Intervention(s): Monitored during session;Repositioned    Home Living                      Prior Function            PT Goals (current goals can now be found in the care plan section) Acute Rehab PT Goals PT Goal Formulation: With patient Time For Goal Achievement: 09/21/13 Potential to  Achieve Goals: Good Progress towards PT goals: Progressing toward goals    Frequency  Min 5X/week    PT Plan Current plan remains appropriate    Co-evaluation             End of Session   Activity Tolerance: Patient limited by fatigue Patient left: with call bell/phone within reach;in chair     Time: 1215-1240 PT Time Calculation (min): 25 min  Charges:  $Therapeutic Exercise: 8-22 mins $Therapeutic Activity: 8-22 mins                    G Codes:       [name], [name]  [name] 09/15/2013, 1:35 PM

## 2013-09-16 LAB — IRON AND TIBC
Iron: 25 ug/dL — ABNORMAL LOW (ref 42–135)
Saturation Ratios: 15 % — ABNORMAL LOW (ref 20–55)
TIBC: 163 ug/dL — ABNORMAL LOW (ref 215–435)
UIBC: 138 ug/dL (ref 125–400)

## 2013-09-16 LAB — BASIC METABOLIC PANEL
Anion gap: 11 (ref 5–15)
BUN: 19 mg/dL (ref 6–23)
CALCIUM: 8.1 mg/dL — AB (ref 8.4–10.5)
CHLORIDE: 100 meq/L (ref 96–112)
CO2: 25 meq/L (ref 19–32)
CREATININE: 1.13 mg/dL (ref 0.50–1.35)
GFR calc Af Amer: 84 mL/min — ABNORMAL LOW (ref >90)
GFR calc non Af Amer: 73 mL/min — ABNORMAL LOW (ref >90)
GLUCOSE: 210 mg/dL — AB (ref 70–99)
Potassium: 4.1 mEq/L (ref 3.7–5.3)
Sodium: 136 mEq/L — ABNORMAL LOW (ref 137–147)

## 2013-09-16 LAB — HEMOGLOBIN A1C
HEMOGLOBIN A1C: 9.2 % — AB (ref <5.7)
MEAN PLASMA GLUCOSE: 217 mg/dL — AB (ref <117)

## 2013-09-16 LAB — CBC
HCT: 23.5 % — ABNORMAL LOW (ref 39.0–52.0)
Hemoglobin: 7.4 g/dL — ABNORMAL LOW (ref 13.0–17.0)
MCH: 25.7 pg — ABNORMAL LOW (ref 26.0–34.0)
MCHC: 31.5 g/dL (ref 30.0–36.0)
MCV: 81.6 fL (ref 78.0–100.0)
Platelets: 396 10*3/uL (ref 150–400)
RBC: 2.88 MIL/uL — AB (ref 4.22–5.81)
RDW: 15.4 % (ref 11.5–15.5)
WBC: 9.8 10*3/uL (ref 4.0–10.5)

## 2013-09-16 LAB — FOLATE: Folate: 7 ng/mL

## 2013-09-16 LAB — VITAMIN B12: VITAMIN B 12: 456 pg/mL (ref 211–911)

## 2013-09-16 LAB — GLUCOSE, CAPILLARY
GLUCOSE-CAPILLARY: 211 mg/dL — AB (ref 70–99)
GLUCOSE-CAPILLARY: 168 mg/dL — AB (ref 70–99)
Glucose-Capillary: 211 mg/dL — ABNORMAL HIGH (ref 70–99)
Glucose-Capillary: 199 mg/dL — ABNORMAL HIGH (ref 70–99)

## 2013-09-16 LAB — FERRITIN: FERRITIN: 666 ng/mL — AB (ref 22–322)

## 2013-09-16 LAB — RETICULOCYTES
RBC.: 2.8 MIL/uL — ABNORMAL LOW (ref 4.22–5.81)
RETIC COUNT ABSOLUTE: 47.6 10*3/uL (ref 19.0–186.0)
Retic Ct Pct: 1.7 % (ref 0.4–3.1)

## 2013-09-16 LAB — PREPARE RBC (CROSSMATCH)

## 2013-09-16 MED ORDER — ASPIRIN EC 325 MG PO TBEC
325.0000 mg | DELAYED_RELEASE_TABLET | Freq: Every day | ORAL | Status: DC
  Administered 2013-09-17 – 2013-09-18 (×2): 325 mg via ORAL
  Filled 2013-09-16 (×2): qty 1

## 2013-09-16 MED ORDER — POLYETHYLENE GLYCOL 3350 17 G PO PACK: 17.0000 g | PACK | Freq: Every day | ORAL | Status: DC | PRN

## 2013-09-16 MED ORDER — CARVEDILOL 3.125 MG PO TABS: 3.1250 mg | ORAL_TABLET | Freq: Two times a day (BID) | ORAL | Status: DC

## 2013-09-16 MED ORDER — INSULIN ASPART 100 UNIT/ML ~~LOC~~ SOLN: 0.0000 [IU] | Freq: Every day | SUBCUTANEOUS | Status: DC

## 2013-09-16 MED ORDER — SODIUM CHLORIDE 0.9 % IV SOLN: INTRAVENOUS | Status: DC

## 2013-09-16 MED ORDER — INSULIN ASPART 100 UNIT/ML ~~LOC~~ SOLN
0.0000 [IU] | Freq: Three times a day (TID) | SUBCUTANEOUS | Status: DC
  Administered 2013-09-16: 3 [IU] via SUBCUTANEOUS
  Administered 2013-09-16 – 2013-09-17 (×2): 5 [IU] via SUBCUTANEOUS
  Administered 2013-09-17 – 2013-09-18 (×2): 3 [IU] via SUBCUTANEOUS
  Administered 2013-09-18: 5 [IU] via SUBCUTANEOUS

## 2013-09-16 MED ORDER — SODIUM CHLORIDE 0.9 % IV SOLN: Freq: Once | INTRAVENOUS | Status: DC

## 2013-09-16 MED ORDER — INSULIN ASPART 100 UNIT/ML ~~LOC~~ SOLN: 0.0000 [IU] | Freq: Three times a day (TID) | SUBCUTANEOUS | Status: DC

## 2013-09-16 MED ORDER — INSULIN GLARGINE 100 UNIT/ML ~~LOC~~ SOLN
20.0000 [IU] | Freq: Every day | SUBCUTANEOUS | Status: DC
  Filled 2013-09-16: qty 0.2

## 2013-09-16 MED ORDER — AMLODIPINE BESYLATE 5 MG PO TABS
5.0000 mg | ORAL_TABLET | Freq: Every day | ORAL | Status: DC
  Administered 2013-09-16: 5 mg via ORAL
  Filled 2013-09-16 (×3): qty 1

## 2013-09-16 MED ORDER — DOCUSATE SODIUM 100 MG PO CAPS
100.0000 mg | ORAL_CAPSULE | Freq: Two times a day (BID) | ORAL | Status: DC
  Administered 2013-09-16 – 2013-09-18 (×5): 100 mg via ORAL
  Filled 2013-09-16 (×6): qty 1

## 2013-09-16 MED ORDER — PANTOPRAZOLE SODIUM 40 MG PO TBEC
40.0000 mg | DELAYED_RELEASE_TABLET | Freq: Every day | ORAL | Status: DC
  Administered 2013-09-16 – 2013-09-18 (×3): 40 mg via ORAL
  Filled 2013-09-16 (×3): qty 1

## 2013-09-16 NOTE — Progress Notes (Signed)
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 Required significant narcotics for pain control post op and developed ileus. On 9/9 had worsening ileus/ abd pain, hypotension and acute renal failure and PCCM consulted.  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  - Management per primary team, orthopedics    Active Problems: Acute renal failure likely due to sepsis, ileus - resolved, placed on gentle hydration until on solid diet    Ileus, postoperative - improving, pt tolerated clamping NG and clears - advance diet to full liquids and solids as tolerated - continue bowel regimen     Diabetes mellitus type 2, uncontrolled, with complications - Continue sliding scale insulin, DC D5 drip will place on lantus after reviewing BS off the D5drip    HYPERTENSION -Currently stable, restart Coreg, amlodipine - hydralazine PRN    Anemia - H&H low, 7.4, likely postop, patient also has anemia of chronic disease, will check anemia panel - transfuse 2 units     Severe sepsis(995.92)- likely gut translocation, history of osteomyelitis - Currently stable - Patient was on vancomycin and Zosyn, discontinued by CCM on 9/11 - Afebrile, no leukocytosis, procalcitonin improving   DVT Prophylaxis:SCDs   Code Status:Full code   Family Communication:  Disposition:Per primary team      Subjective: Patient seen and examined, feels better today, tolerating clears, abd distension improving  Objective: Weight change: -3 kg (-6 lb 9.8 oz)  Intake/Output Summary (Last 24 hours) at  09/16/13 0735 Last data filed at 09/16/13 0618  Gross per 24 hour  Intake     10 ml  Output    800 ml  Net   -790 ml   Blood pressure 142/56, pulse 93, temperature 99.3 F (37.4 C), temperature source Oral, resp. rate 18, height 6' (1.829 m), weight 82.3 kg (181 lb 7 oz), SpO2 100.00%.  Physical Exam: General: Alert and awake, oriented x3, not in any acute distress. CVS: S1-S2 clear, no murmur rubs or gallops Chest: clear to auscultation bilaterally, no wheezing, rales or rhonchi Abdomen: soft nontender, distension improving, normal BS Extremities: right BKA   Lab Results: Basic Metabolic Panel:  Recent Labs Lab 09/11/13 1650  09/15/13 0346 09/16/13 0553  NA 136*  < > 139 136*  K 5.0  < > 4.7 4.1  CL 88*  < > 102 100  CO2 30  < > 25 25  GLUCOSE 213*  < > 155* 210*  BUN 43*  < > 27* 19  CREATININE 3.62*  < > 1.49* 1.13  CALCIUM 9.3  < > 8.1* 8.1*  MG 2.6*  --   --   --   < > = values in this interval not displayed. Liver Function Tests:  Recent Labs Lab 09/12/13  1015  AST 72*  ALT 64*  ALKPHOS 245*  BILITOT 0.5  PROT 8.1  ALBUMIN 2.4*    Recent Labs Lab 09/12/13 1600  LIPASE 11  AMYLASE 31   No results found for this basename: AMMONIA,  in the last 168 hours CBC:  Recent Labs Lab 09/15/13 0346 09/16/13 0553  WBC 8.8 9.8  HGB 7.5* 7.4*  HCT 23.9* 23.5*  MCV 81.3 81.6  PLT 391 396   Cardiac Enzymes:  Recent Labs Lab 09/12/13 1338 09/12/13 1851 09/13/13 0205  TROPONINI <0.30 <0.30 <0.30   BNP: No components found with this basename: POCBNP,  CBG:  Recent Labs Lab 09/15/13 1240 09/15/13 1638 09/15/13 2153 09/15/13 2357 09/16/13 0420  GLUCAP 177* 265* 197* 182* 211*     Micro Results: Recent Results (from the past 240 hour(s))  MRSA PCR SCREENING     Status: None   Collection Time    09/12/13  1:47 PM      Result Value Ref Range Status   MRSA by PCR NEGATIVE  NEGATIVE Final   Comment:            The GeneXpert MRSA Assay  (FDA     approved for NASAL specimens     only), is one component of a     comprehensive MRSA colonization     surveillance program. It is not     intended to diagnose MRSA     infection nor to guide or     monitor treatment for     MRSA infections.  URINE CULTURE     Status: None   Collection Time    09/12/13  3:40 PM      Result Value Ref Range Status   Specimen Description URINE, CATHETERIZED   Final   Special Requests NONE   Final   Culture  Setup Time     Final   Value: 09/12/2013 20:36     Performed at [facility]     Final   Value: NO GROWTH     Performed at [facility]   Culture     Final   Value: NO GROWTH     Performed at [facility]   Report Status 09/13/2013 FINAL   Final  CULTURE, BLOOD (ROUTINE X 2)     Status: None   Collection Time    09/12/13  4:10 PM      Result Value Ref Range Status   Specimen Description BLOOD RIGHT ARM   Final   Special Requests BOTTLES DRAWN AEROBIC AND ANAEROBIC 10CC   Final   Culture  Setup Time     Final   Value: 09/12/2013 20:32     Performed at [facility]   Culture     Final   Value:        BLOOD CULTURE RECEIVED NO GROWTH TO DATE CULTURE WILL BE HELD FOR 5 DAYS BEFORE ISSUING A FINAL NEGATIVE REPORT     Performed at [facility]   Report Status PENDING   Incomplete  CULTURE, BLOOD (ROUTINE X 2)     Status: None   Collection Time    09/12/13  4:16 PM      Result Value Ref Range Status   Specimen Description BLOOD RIGHT HAND   Final   Special Requests BOTTLES DRAWN AEROBIC AND ANAEROBIC 10CC   Final   Culture  Setup Time     Final   Value: 09/12/2013 20:32  Performed at [facility]     Final   Value:        BLOOD CULTURE RECEIVED NO GROWTH TO DATE CULTURE WILL BE HELD FOR 5 DAYS BEFORE ISSUING A FINAL NEGATIVE REPORT     Performed at [facility]   Report Status PENDING   Incomplete    Studies/Results: Dg Chest 2 View  09/07/2013    CLINICAL DATA:  Preoperative prior to right leg amputation; history of diabetes, hypertension, and previous tobacco use.  EXAM: CHEST  2 VIEW  COMPARISON:  PA and lateral chest of August 30, 2013  FINDINGS: The lungs are adequately inflated and clear. The heart and pulmonary vascularity are normal. The mediastinum is normal in width. There is no pleural effusion. The bony thorax is unremarkable.  IMPRESSION: There is no active cardiopulmonary disease.   Electronically Signed   By: David  Martinique   On: 09/07/2013 15:32   Dg Chest 2 View  08/31/2013   CLINICAL DATA:  Several day history of cough and congestion  EXAM: CHEST  2 VIEW  COMPARISON:  .  PA and lateral chest x-ray of May 11, 2013  FINDINGS: The lungs are adequately inflated. There is is increased density posteriorly on the lateral film which may lie on the right at and just below the medial aspect of the hemidiaphragm on the frontal film. The cardiac silhouette and pulmonary vascularity are normal. There is no pleural effusion or pneumothorax. The mediastinum is normal in width. The bony thorax is unremarkable.  IMPRESSION: There is increased density presumably in the right lower lobe posteriorly consistent with atelectasis or focal pneumonia. Overlap of normal bony structures may be contributing to the density. Followup films following therapy are recommended to assure complete clearing.   Electronically Signed   By: David  Martinique   On: 08/31/2013 08:16   Dg Abd 1 View  09/10/2013   CLINICAL DATA:  Postop nausea, vomiting and constipation. Stabbing right upper quadrant pain.  EXAM: ABDOMEN - 1 VIEW  COMPARISON:  05/10/2013.  FINDINGS: Stool is seen throughout the colon. Single dilated loop of small bowel seen in the left abdomen, nonspecific. Atherosclerotic calcification of the arterial vasculature.  IMPRESSION: Bowel gas pattern is indicative of constipation.   Electronically Signed   By: Lorin Picket M.D.   On: 09/10/2013 15:27   US  Renal  09/12/2013   CLINICAL DATA:  Acute renal failure.  EXAM: RENAL/URINARY TRACT ULTRASOUND COMPLETE  COMPARISON:  None.  FINDINGS: Right Kidney:  Length: 11.0 cm. Echogenicity within normal limits. No mass or hydronephrosis visualized.  Left Kidney:  Length: 11.1 cm. Suboptimally visualized due to adjacent bowel, limited acoustic window, and maceration. No hydronephrosis or gross abnormality identified.  Bladder:  Decompressed by Foley catheter.  IMPRESSION: No hydronephrosis. Unremarkable right and grossly unremarkable left kidneys.   Electronically Signed   By: Logan Bores   On: 09/12/2013 09:33   Dg Chest Port 1 View  09/12/2013   CLINICAL DATA:  Central line inserted, check position  EXAM: PORTABLE CHEST - 1 VIEW  COMPARISON:  09/07/2013  FINDINGS: Limited inspiratory effect. Enlargement of cardiac silhouette. NG tube extends into the stomach. Right internal jugular central line has been placed with tip to the cavoatrial junction. No pneumothorax. Bilateral vascular congestion.  IMPRESSION: Central line as described with no pneumothorax.   Electronically Signed   By: Skipper Cliche M.D.   On: 09/12/2013 14:44   Dg Abd Portable 1v  09/15/2013  CLINICAL DATA:  Ileus  EXAM: PORTABLE ABDOMEN - 1 VIEW  COMPARISON:  09/12/2013  FINDINGS: NG tube identified within the stomach. There is central abdominal small bowel dilatation. Maximal diameter is 3.8 cm, decreased from 4.4 cm previously. There is little gas in the colon with a small amount of air in the rectum. The bladder is mildly distended.  IMPRESSION: Improving small bowel dilatation. Minimal gas into the colon. Improving small bowel ileus suspected, although low-grade partial small bowel obstruction not excluded.   Electronically Signed   By: Skipper Cliche M.D.   On: 09/15/2013 08:52   Dg Abd Portable 1v  09/12/2013   ADDENDUM REPORT: 09/12/2013 08:54  ADDENDUM: These results will be called to the ordering clinician or representative by the  Radiologist Assistant, and communication documented in the PACS or zVision Dashboard.   Electronically Signed   By: Logan Bores   On: 09/12/2013 08:54   09/12/2013   CLINICAL DATA:  Abdominal distension.  EXAM: PORTABLE ABDOMEN - 1 VIEW  COMPARISON:  09/10/2013  FINDINGS: Stool is again seen throughout the colon. Gas is present in the sigmoid colon and rectum. Multiple loops of gas-filled, dilated small bowel are present in the central abdomen measuring up to 4.4 cm in diameter, increased from the prior study. No gross intraperitoneal free air on the supine images.  IMPRESSION: Increasing gaseous small bowel dilatation with persistent stool throughout the colon and distal colonic gas. Findings are suggestive of worsening postoperative ileus, although a developing small bowel obstruction is not excluded.  Electronically Signed: By: Logan Bores On: 09/12/2013 08:43    Medications: Scheduled Meds: . sodium chloride   Intravenous Once  . antiseptic oral rinse  7 mL Mouth Rinse q12n4p  . aspirin  325 mg Oral Daily  . bisacodyl  10 mg Rectal Daily  . carvedilol  3.125 mg Oral BID WC  . chlorhexidine  15 mL Mouth Rinse BID  . docusate sodium  100 mg Oral BID  . insulin aspart  0-15 Units Subcutaneous TID WC  . insulin aspart  0-5 Units Subcutaneous QHS  . metoCLOPramide (REGLAN) injection  5 mg Intravenous 4 times per day  . pantoprazole  40 mg Oral Q0600  . sodium chloride  10-40 mL Intracatheter Q12H      LOS: 9 days   Kell Ferris M.D. [facility] 09/16/2013, 7:35 AM Pager: [phone]  If 7PM-7AM, please contact night-coverage [email or web address] Password [email or web address]  **Disclaimer: This note was dictated with voice recognition software. Similar sounding words can inadvertently be transcribed and this note may contain transcription errors which may not have been corrected upon publication of note.**

## 2013-09-16 NOTE — Progress Notes (Signed)
Patient ID: [name], male   DOB: 04-22-1960, 53 y.o.   MRN: [id number] No acute changes.  Antibiotics stopped yesterday due to no evidence of an active infection or sepsis.  His vitals remain stable, he has been afebrile, and his WBC is normal.  Awaiting advancement of his diet as his ileus resolves.  H/H still low, but seems to be tolerating is well.  Will repeat CBC in am.

## 2013-09-16 NOTE — Progress Notes (Signed)
RN went back in to reassess patient. Patient states that he is feeling ok. Patient is sitting on the up on the edge of the bed. RN will continue to monitor.

## 2013-09-16 NOTE — Progress Notes (Signed)
Patient expressed to RN that he feels uncomfortable taking pain medications because he feels as if he received too much pain medication and ended up in the ICU because of it. Patient asked RN if she feels as if he should take pain medication. RN informed patient that the pain medication may alleviate some pain but that it is his choice and his right to receive or not receive pain medication. Patient asked for the lowest dose of morphine possible so that he can try to get some rest tonight. RN will administer 1 mg of morphine and continue to monitor and reassess.

## 2013-09-17 ENCOUNTER — Ambulatory Visit: Payer: Medicaid Other | Admitting: Internal Medicine

## 2013-09-17 DIAGNOSIS — I739 Peripheral vascular disease, unspecified: Secondary | ICD-10-CM

## 2013-09-17 DIAGNOSIS — K219 Gastro-esophageal reflux disease without esophagitis: Secondary | ICD-10-CM

## 2013-09-17 DIAGNOSIS — S88119A Complete traumatic amputation at level between knee and ankle, unspecified lower leg, initial encounter: Secondary | ICD-10-CM

## 2013-09-17 DIAGNOSIS — L98499 Non-pressure chronic ulcer of skin of other sites with unspecified severity: Secondary | ICD-10-CM

## 2013-09-17 LAB — TYPE AND SCREEN
ABO/RH(D): A POS
ANTIBODY SCREEN: NEGATIVE
UNIT DIVISION: 0
Unit division: 0

## 2013-09-17 LAB — GLUCOSE, CAPILLARY
GLUCOSE-CAPILLARY: 29 mg/dL — AB (ref 70–99)
GLUCOSE-CAPILLARY: 190 mg/dL — AB (ref 70–99)
GLUCOSE-CAPILLARY: 162 mg/dL — AB (ref 70–99)
Glucose-Capillary: 221 mg/dL — ABNORMAL HIGH (ref 70–99)
Glucose-Capillary: 102 mg/dL — ABNORMAL HIGH (ref 70–99)

## 2013-09-17 LAB — BASIC METABOLIC PANEL
ANION GAP: 12 (ref 5–15)
BUN: 14 mg/dL (ref 6–23)
CO2: 22 mEq/L (ref 19–32)
Calcium: 8.3 mg/dL — ABNORMAL LOW (ref 8.4–10.5)
Chloride: 101 mEq/L (ref 96–112)
Creatinine, Ser: 0.99 mg/dL (ref 0.50–1.35)
Glucose, Bld: 248 mg/dL — ABNORMAL HIGH (ref 70–99)
Potassium: 4.2 mEq/L (ref 3.7–5.3)
SODIUM: 135 meq/L — AB (ref 137–147)

## 2013-09-17 LAB — CBC
HCT: 28.6 % — ABNORMAL LOW (ref 39.0–52.0)
Hemoglobin: 9.3 g/dL — ABNORMAL LOW (ref 13.0–17.0)
MCH: 26.7 pg (ref 26.0–34.0)
MCHC: 32.5 g/dL (ref 30.0–36.0)
MCV: 82.2 fL (ref 78.0–100.0)
PLATELETS: 396 10*3/uL (ref 150–400)
RBC: 3.48 MIL/uL — ABNORMAL LOW (ref 4.22–5.81)
RDW: 15.1 % (ref 11.5–15.5)
WBC: 9.6 10*3/uL (ref 4.0–10.5)

## 2013-09-17 MED ORDER — BISACODYL 10 MG RE SUPP
10.0000 mg | Freq: Once | RECTAL | Status: AC
  Administered 2013-09-17: 10 mg via RECTAL
  Filled 2013-09-17: qty 1

## 2013-09-17 MED ORDER — FLEET ENEMA 7-19 GM/118ML RE ENEM
1.0000 | ENEMA | Freq: Once | RECTAL | Status: DC
  Filled 2013-09-17: qty 1

## 2013-09-17 MED ORDER — GLUCERNA SHAKE PO LIQD
237.0000 mL | Freq: Two times a day (BID) | ORAL | Status: DC
  Administered 2013-09-18: 237 mL via ORAL

## 2013-09-17 MED ORDER — METOCLOPRAMIDE HCL 10 MG PO TABS: 10.0000 mg | ORAL_TABLET | Freq: Three times a day (TID) | ORAL | Status: DC

## 2013-09-17 MED ORDER — INSULIN ASPART 100 UNIT/ML ~~LOC~~ SOLN
4.0000 [IU] | Freq: Three times a day (TID) | SUBCUTANEOUS | Status: DC
  Administered 2013-09-17 – 2013-09-18 (×5): 4 [IU] via SUBCUTANEOUS

## 2013-09-17 MED ORDER — INSULIN GLARGINE 100 UNIT/ML ~~LOC~~ SOLN
30.0000 [IU] | Freq: Every day | SUBCUTANEOUS | Status: DC
  Administered 2013-09-17: 30 [IU] via SUBCUTANEOUS
  Filled 2013-09-17 (×2): qty 0.3

## 2013-09-17 MED ORDER — METOCLOPRAMIDE HCL 10 MG PO TABS
10.0000 mg | ORAL_TABLET | Freq: Three times a day (TID) | ORAL | Status: DC
  Administered 2013-09-17 – 2013-09-18 (×3): 10 mg via ORAL
  Filled 2013-09-17 (×7): qty 1

## 2013-09-17 MED ORDER — POLYETHYLENE GLYCOL 3350 17 G PO PACK
17.0000 g | PACK | Freq: Every day | ORAL | Status: DC
  Administered 2013-09-17 – 2013-09-18 (×2): 17 g via ORAL
  Filled 2013-09-17 (×3): qty 1

## 2013-09-17 MED ORDER — METOCLOPRAMIDE HCL 5 MG/ML IJ SOLN
10.0000 mg | Freq: Once | INTRAMUSCULAR | Status: AC
  Administered 2013-09-17: 10 mg via INTRAVENOUS
  Filled 2013-09-17: qty 2

## 2013-09-17 MED ORDER — AMLODIPINE BESYLATE 10 MG PO TABS
10.0000 mg | ORAL_TABLET | Freq: Every day | ORAL | Status: DC
  Administered 2013-09-17 – 2013-09-18 (×2): 10 mg via ORAL
  Filled 2013-09-17 (×2): qty 1
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
## 2013-09-17 NOTE — Progress Notes (Signed)
I met with pt at bedside. We discussed an inpt rehab admission before returning home and pt is in agreement. Eating a regular diet today. I will contact Dr. Sharol Given to clarify when pt felt medically ready to admit to inpt rehab. [phone]

## 2013-09-17 NOTE — Progress Notes (Signed)
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 Required significant narcotics for pain control post op and developed ileus. On 9/9 had worsening ileus/ abd pain, hypotension and acute renal failure and PCCM consulted.  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  - Management per primary team, orthopedics - Patient interested in CIR, I placed the consult    Active Problems: Acute renal failure likely due to sepsis, ileus - resolved,DC'ed IVF    Ileus, postoperative - improving, but feels distended after solids, no BM yesterday but passing flatus - placed on reglan TID AC   Constipation; No BM yesterday or today - placed on scheduled colace, miralax, dulcolax supp today, if no BM then enema today      Diabetes mellitus type 2, uncontrolled, with complications - Continue sliding scale insulin, added lantus and meal coverage    HYPERTENSION - continue Coreg, amlodipine, inc to 10mg  - hydralazine PRN    Anemia: Anemia of chronic disease, ferritin 666 - H&H low, 7.4 9/13, likely postop - transfused 2 units 9/13, Hb stable at 9.3    Severe sepsis(995.92)- likely gut translocation, history of osteomyelitis - Currently stable, was on vancomycin and Zosyn, discontinued by CCM on 9/11 - Afebrile, no leukocytosis   DVT Prophylaxis:SCDs   Code Status:Full code   Family Communication:  Disposition:Per primary team      Subjective: Patient seen and examined, feels better today, tolerating soft solids but feels distended after coffee, no BM yesterday or  today  Objective: Weight change: 2.2 kg (4 lb 13.6 oz)  Intake/Output Summary (Last 24 hours) at 09/17/13 0756 Last data filed at 09/17/13 [id number]  Gross per 24 hour  Intake  852.5 ml  Output   1000 ml  Net -147.5 ml   Blood pressure 173/83, pulse 98, temperature 98.3 F (36.8 C), temperature source Oral, resp. rate 18, height 6' (1.829 m), weight 84.5 kg (186 lb 4.6 oz), SpO2 99.00%.  Physical Exam: General: Alert and awake, oriented x3, NAD CVS: S1-S2 clear, no murmur rubs or gallops Chest: clear to auscultation bilaterally, no wheezing, rales or rhonchi Abdomen: soft nontender, distension improving, normal BS Extremities: right BKA   Lab Results: Basic Metabolic Panel:  Recent Labs Lab 09/11/13 1650  09/16/13 0553 09/17/13 0435  NA 136*  < > 136* 135*  K 5.0  < > 4.1 4.2  CL 88*  < > 100 101  CO2 30  < > 25 22  GLUCOSE 213*  < > 210* 248*  BUN 43*  < > 19 14  CREATININE 3.62*  < > 1.13 0.99  CALCIUM 9.3  < > 8.1* 8.3*  MG 2.6*  --   --   --   < > = values in this  interval not displayed. Liver Function Tests:  Recent Labs Lab 09/12/13 1015  AST 72*  ALT 64*  ALKPHOS 245*  BILITOT 0.5  PROT 8.1  ALBUMIN 2.4*    Recent Labs Lab 09/12/13 1600  LIPASE 11  AMYLASE 31   No results found for this basename: AMMONIA,  in the last 168 hours CBC:  Recent Labs Lab 09/16/13 0553 09/17/13 0435  WBC 9.8 9.6  HGB 7.4* 9.3*  HCT 23.5* 28.6*  MCV 81.6 82.2  PLT 396 396   Cardiac Enzymes:  Recent Labs Lab 09/12/13 1338 09/12/13 1851 09/13/13 0205  TROPONINI <0.30 <0.30 <0.30   BNP: No components found with this basename: POCBNP,  CBG:  Recent Labs Lab 09/16/13 0420 09/16/13 1112 09/16/13 1616 09/16/13 2111 09/17/13 0638  GLUCAP 211* 199* 211* 168* 221*     Micro Results: Recent Results (from the past 240 hour(s))  MRSA PCR SCREENING     Status: None   Collection Time    09/12/13  1:47 PM      Result Value Ref Range Status   MRSA by  PCR NEGATIVE  NEGATIVE Final   Comment:            The GeneXpert MRSA Assay (FDA     approved for NASAL specimens     only), is one component of a     comprehensive MRSA colonization     surveillance program. It is not     intended to diagnose MRSA     infection nor to guide or     monitor treatment for     MRSA infections.  URINE CULTURE     Status: None   Collection Time    09/12/13  3:40 PM      Result Value Ref Range Status   Specimen Description URINE, CATHETERIZED   Final   Special Requests NONE   Final   Culture  Setup Time     Final   Value: 09/12/2013 20:36     Performed at [facility]     Final   Value: NO GROWTH     Performed at [facility]   Culture     Final   Value: NO GROWTH     Performed at [facility]   Report Status 09/13/2013 FINAL   Final  CULTURE, BLOOD (ROUTINE X 2)     Status: None   Collection Time    09/12/13  4:10 PM      Result Value Ref Range Status   Specimen Description BLOOD RIGHT ARM   Final   Special Requests BOTTLES DRAWN AEROBIC AND ANAEROBIC 10CC   Final   Culture  Setup Time     Final   Value: 09/12/2013 20:32     Performed at [facility]   Culture     Final   Value:        BLOOD CULTURE RECEIVED NO GROWTH TO DATE CULTURE WILL BE HELD FOR 5 DAYS BEFORE ISSUING A FINAL NEGATIVE REPORT     Performed at [facility]   Report Status PENDING   Incomplete  CULTURE, BLOOD (ROUTINE X 2)     Status: None   Collection Time    09/12/13  4:16 PM      Result Value Ref Range Status   Specimen Description BLOOD RIGHT HAND   Final   Special Requests BOTTLES DRAWN AEROBIC AND ANAEROBIC 10CC   Final   Culture  Setup  Time     Final   Value: 09/12/2013 20:32     Performed at [facility]   Culture     Final   Value:        BLOOD CULTURE RECEIVED NO GROWTH TO DATE CULTURE WILL BE HELD FOR 5 DAYS BEFORE ISSUING A FINAL NEGATIVE REPORT     Performed at [facility]   Report  Status PENDING   Incomplete    Studies/Results: Dg Chest 2 View  09/07/2013   CLINICAL DATA:  Preoperative prior to right leg amputation; history of diabetes, hypertension, and previous tobacco use.  EXAM: CHEST  2 VIEW  COMPARISON:  PA and lateral chest of August 30, 2013  FINDINGS: The lungs are adequately inflated and clear. The heart and pulmonary vascularity are normal. The mediastinum is normal in width. There is no pleural effusion. The bony thorax is unremarkable.  IMPRESSION: There is no active cardiopulmonary disease.   Electronically Signed   By: David  Martinique   On: 09/07/2013 15:32   Dg Chest 2 View  08/31/2013   CLINICAL DATA:  Several day history of cough and congestion  EXAM: CHEST  2 VIEW  COMPARISON:  .  PA and lateral chest x-ray of May 11, 2013  FINDINGS: The lungs are adequately inflated. There is is increased density posteriorly on the lateral film which may lie on the right at and just below the medial aspect of the hemidiaphragm on the frontal film. The cardiac silhouette and pulmonary vascularity are normal. There is no pleural effusion or pneumothorax. The mediastinum is normal in width. The bony thorax is unremarkable.  IMPRESSION: There is increased density presumably in the right lower lobe posteriorly consistent with atelectasis or focal pneumonia. Overlap of normal bony structures may be contributing to the density. Followup films following therapy are recommended to assure complete clearing.   Electronically Signed   By: David  Martinique   On: 08/31/2013 08:16   Dg Abd 1 View  09/10/2013   CLINICAL DATA:  Postop nausea, vomiting and constipation. Stabbing right upper quadrant pain.  EXAM: ABDOMEN - 1 VIEW  COMPARISON:  05/10/2013.  FINDINGS: Stool is seen throughout the colon. Single dilated loop of small bowel seen in the left abdomen, nonspecific. Atherosclerotic calcification of the arterial vasculature.  IMPRESSION: Bowel gas pattern is indicative of constipation.    Electronically Signed   By: Lorin Picket M.D.   On: 09/10/2013 15:27   US Renal  09/12/2013   CLINICAL DATA:  Acute renal failure.  EXAM: RENAL/URINARY TRACT ULTRASOUND COMPLETE  COMPARISON:  None.  FINDINGS: Right Kidney:  Length: 11.0 cm. Echogenicity within normal limits. No mass or hydronephrosis visualized.  Left Kidney:  Length: 11.1 cm. Suboptimally visualized due to adjacent bowel, limited acoustic window, and maceration. No hydronephrosis or gross abnormality identified.  Bladder:  Decompressed by Foley catheter.  IMPRESSION: No hydronephrosis. Unremarkable right and grossly unremarkable left kidneys.   Electronically Signed   By: Logan Bores   On: 09/12/2013 09:33   Dg Chest Port 1 View  09/12/2013   CLINICAL DATA:  Central line inserted, check position  EXAM: PORTABLE CHEST - 1 VIEW  COMPARISON:  09/07/2013  FINDINGS: Limited inspiratory effect. Enlargement of cardiac silhouette. NG tube extends into the stomach. Right internal jugular central line has been placed with tip to the cavoatrial junction. No pneumothorax. Bilateral vascular congestion.  IMPRESSION: Central line as described with no pneumothorax.   Electronically Signed   By: Kyung Rudd  Rubner M.D.   On: 09/12/2013 14:44   Dg Abd Portable 1v  09/15/2013   CLINICAL DATA:  Ileus  EXAM: PORTABLE ABDOMEN - 1 VIEW  COMPARISON:  09/12/2013  FINDINGS: NG tube identified within the stomach. There is central abdominal small bowel dilatation. Maximal diameter is 3.8 cm, decreased from 4.4 cm previously. There is little gas in the colon with a small amount of air in the rectum. The bladder is mildly distended.  IMPRESSION: Improving small bowel dilatation. Minimal gas into the colon. Improving small bowel ileus suspected, although low-grade partial small bowel obstruction not excluded.   Electronically Signed   By: Skipper Cliche M.D.   On: 09/15/2013 08:52   Dg Abd Portable 1v  09/12/2013   ADDENDUM REPORT: 09/12/2013 08:54  ADDENDUM: These  results will be called to the ordering clinician or representative by the Radiologist Assistant, and communication documented in the PACS or zVision Dashboard.   Electronically Signed   By: Logan Bores   On: 09/12/2013 08:54   09/12/2013   CLINICAL DATA:  Abdominal distension.  EXAM: PORTABLE ABDOMEN - 1 VIEW  COMPARISON:  09/10/2013  FINDINGS: Stool is again seen throughout the colon. Gas is present in the sigmoid colon and rectum. Multiple loops of gas-filled, dilated small bowel are present in the central abdomen measuring up to 4.4 cm in diameter, increased from the prior study. No gross intraperitoneal free air on the supine images.  IMPRESSION: Increasing gaseous small bowel dilatation with persistent stool throughout the colon and distal colonic gas. Findings are suggestive of worsening postoperative ileus, although a developing small bowel obstruction is not excluded.  Electronically Signed: By: Logan Bores On: 09/12/2013 08:43    Medications: Scheduled Meds: . sodium chloride   Intravenous Once  . amLODipine  10 mg Oral Daily  . antiseptic oral rinse  7 mL Mouth Rinse q12n4p  . aspirin EC  325 mg Oral Daily  . bisacodyl  10 mg Rectal Once  . carvedilol  3.125 mg Oral BID WC  . chlorhexidine  15 mL Mouth Rinse BID  . docusate sodium  100 mg Oral BID  . insulin aspart  0-15 Units Subcutaneous TID WC  . insulin aspart  0-5 Units Subcutaneous QHS  . insulin aspart  4 Units Subcutaneous TID WC  . insulin glargine  30 Units Subcutaneous Daily  . metoCLOPramide (REGLAN) injection  10 mg Intravenous Once  . metoCLOPramide  10 mg Oral TID AC  . pantoprazole  40 mg Oral Q0600  . polyethylene glycol  17 g Oral Daily  . sodium chloride  10-40 mL Intracatheter Q12H  . sodium phosphate  1 enema Rectal Once      LOS: 10 days   RAI,RIPUDEEP M.D. [facility] 09/17/2013, 7:56 AM Pager: [phone]  If 7PM-7AM, please contact night-coverage [email or web address] Password [email or web address]  **Disclaimer:  This note was dictated with voice recognition software. Similar sounding words can inadvertently be transcribed and this note may contain transcription errors which may not have been corrected upon publication of note.**

## 2013-09-17 NOTE — Progress Notes (Signed)
Chaplain visited with patient and mother. Discussed length of stay and difficulty remembering time in ICU. Chaplain would like to have conversation without family present to discuss emotional and spiritual needs. Will follow as needed.   09/17/13 1500  Clinical Encounter Type  Visited With Patient and family together  Visit Type Initial  Stress Factors  Patient Stress Factors Loss;Major life changes  Family Stress Factors None identified  Rolly Salter, Chaplain 09/17/2013 3:49 PM

## 2013-09-17 NOTE — Progress Notes (Signed)
PT Cancellation Note  Patient Details Name: [name] MRN: [id number] DOB: 09-02-1960   Cancelled Treatment:    Reason Eval/Treat Not Completed: Fatigue/lethargy limiting ability to participate. Patient had just gotten finished working with OT and requested to hold PT at this time due to fatigue. Will follow up in AM. Possibly being admitted to [facility] later today   [name], Tonia Brooms 09/17/2013, 2:04 PM
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
## 2013-09-17 NOTE — PMR Pre-admission (Signed)
PMR Admission Coordinator Pre-Admission Assessment  Patient: [name] is an 53 y.o., male MRN: [id number] DOB: [birth year] Height: 6' (182.9 cm) Weight: 86.1 kg (189 lb 13.1 oz)              Insurance Information HMO:     PPO:      PCP:      IPA:      80/20:      OTHER:  PRIMARY: Medicaid [place] Access      Policy#: [id number]      Subscriber: pt  Medicaid Application Date:       Case Manager:  Disability Application Date:       Case Worker:   Emergency Contact Information Contact Information   Name Relation Home Work Mobile   Berrong,Jemeca Daughter [phone]  [phone]   Tawanna Sat Sister [phone]     Constantinos, Arballo   [phone]     Current Medical History  Patient Admitting Diagnosis: right BKA, hx of left TMA  History of Present Illness: [name] is a 53 y.o. 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   He was admitted on 09/07/13 for R-BKA by Dr. Sharol Given due to progressive dehiscence and necrosis of right foot due to gangrenous changes. Post op with pain requiring high doses of narcotics as well as constipation with abdominal distension. On 09/09, he developed ileus with lethargy and mental status changes due to acute renal failure due to ATN from hypotension. He was transferred to step down unit and treated with aggressive volume resuscitation with empiric antibiotics NGT as well as bowel rest. Foley placed and renal u/s without hydronephrosis. Ileus resolving and NGT discontinued and diet has been advanced to soft. Mentation has improved but patient now deconditioned .  Past Medical History  Past Medical History  Diagnosis Date  . Hyperlipidemia   . Hypertension   . Osteomyelitis 2010    left foot, s/p midfoot amputation  . 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 Neuromuscular disorder     diabetic neruopathy   . PAD (peripheral artery disease)     ABIs 11/30/11: L ABI 0.68, R ABI 0.84  . Pneumonia 2010  . Critical lower limb ischemia, lt with ABI of 0.60 12/31/2011  . PVD (peripheral vascular disease) 12/31/2011  . S/P angioplasty with stent, 12/30/11, of Lt SFA, Post. tibialis and PTA of L. ant and post. tibial vessels 12/31/2011  . Type II diabetes mellitus ~ 2002  . GERD (gastroesophageal reflux disease)   . CHF (congestive heart failure)   . Gangrene     right foot  . Osteomyelitis 09/2013    RT BKA    Family History  family history includes Diabetes in his mother; Hypertension in his brother and sister. There is no history of Anesthesia problems.  Prior Rehab/Hospitalizations: none  Current Medications  Current facility-administered medications:0.9 %  sodium chloride infusion, , Intravenous, Once, Ripudeep K Rai, MD;  amLODipine (NORVASC) tablet 10 mg, 10 mg, Oral, Daily, Ripudeep K Rai, MD, 10 mg at 09/18/13 1024;  antiseptic oral rinse (CPC / CETYLPYRIDINIUM CHLORIDE 0.05%) solution 7 mL, 7 mL, Mouth Rinse, q12n4p, Wilhelmina Mcardle, MD, 7 mL at 09/17/13 1844 aspirin EC tablet 325 mg, 325 mg, Oral, Daily, Ripudeep K Rai, MD, 325 mg at 09/18/13 1024;  bisacodyl (DULCOLAX) EC tablet 5 mg, 5 mg, Oral,  Daily PRN, [name], MD, 5 mg at 09/11/13 2148;  bisacodyl (DULCOLAX) suppository 10 mg, 10 mg, Rectal, Daily PRN, Marybelle Killings, MD, 10 mg at 09/10/13 1735;  carvedilol (COREG) tablet 3.125 mg, 3.125 mg, Oral, BID WC, Donita Brooks, NP, 3.125 mg at 09/18/13 1024 chlorhexidine (PERIDEX) 0.12 % solution 15 mL, 15 mL, Mouth Rinse, BID, Wilhelmina Mcardle, MD, 15 mL at 09/17/13 1844;  docusate sodium (COLACE) capsule 100 mg, 100 mg, Oral, BID, Ripudeep K Rai, MD, 100 mg at 09/18/13 1024;  feeding supplement (GLUCERNA SHAKE) (GLUCERNA SHAKE) liquid 237 mL, 237 mL, Oral, BID BM, Dalene Carrow, RD, 237 mL at 09/18/13 1024 hydrALAZINE (APRESOLINE) injection 10 mg, 10 mg, Intravenous, Q6H PRN,  Ripudeep Krystal Eaton, MD;  Derrill Memo ON 09/19/2013] Influenza vac split quadrivalent PF (FLUARIX) injection 0.5 mL, 0.5 mL, Intramuscular, Tomorrow-1000, Meridee Score V, MD;  insulin aspart (novoLOG) injection 0-15 Units, 0-15 Units, Subcutaneous, TID WC, Ripudeep K Rai, MD, 3 Units at 09/18/13 0849 insulin aspart (novoLOG) injection 0-5 Units, 0-5 Units, Subcutaneous, QHS, Ripudeep K Rai, MD;  insulin aspart (novoLOG) injection 4 Units, 4 Units, Subcutaneous, TID WC, Ripudeep K Rai, MD, 4 Units at 09/18/13 0850;  insulin glargine (LANTUS) injection 40 Units, 40 Units, Subcutaneous, Daily, Ripudeep K Rai, MD;  metoCLOPramide (REGLAN) tablet 10 mg, 10 mg, Oral, TID AC, Ripudeep K Rai, MD, 10 mg at 09/17/13 1843 morphine 2 MG/ML injection 1-2 mg, 1-2 mg, Intravenous, Q4H PRN, Doree Fudge, MD, 2 mg at 09/18/13 1025;  ondansetron (ZOFRAN) injection 4 mg, 4 mg, Intravenous, Q6H PRN, Meridee Score V, MD, 4 mg at 09/13/13 0406;  ondansetron (ZOFRAN) tablet 4 mg, 4 mg, Oral, Q6H PRN, Meridee Score V, MD, 4 mg at 09/11/13 1008;  pantoprazole (PROTONIX) EC tablet 40 mg, 40 mg, Oral, Q0600, Ripudeep K Rai, MD, 40 mg at 09/18/13 [id number] polyethylene glycol (MIRALAX / GLYCOLAX) packet 17 g, 17 g, Oral, Daily, Ripudeep K Rai, MD, 17 g at 09/18/13 1024;  sodium chloride 0.9 % bolus 1,000 mL, 1,000 mL, Intravenous, PRN, Debbe Odea, MD;  sodium chloride 0.9 % injection 10-40 mL, 10-40 mL, Intracatheter, Q12H, Raylene Miyamoto, MD, 10 mL at 09/17/13 1027;  sodium chloride 0.9 % injection 10-40 mL, 10-40 mL, Intracatheter, PRN, Raylene Miyamoto, MD, 30 mL at 09/17/13 0439 sodium phosphate (FLEET) 7-19 GM/118ML enema 1 enema, 1 enema, Rectal, Once, Ripudeep Krystal Eaton, MD  Patients Current Diet: Bland  Precautions / Restrictions Precautions Precautions: Fall Precaution Comments: educated pt on no pillow behind knee Restrictions Weight Bearing Restrictions: Yes RLE Weight Bearing: Non weight bearing LLE Weight Bearing: Weight  bearing as tolerated   Prior Activity Level Limited Community (1-2x/wk): limited due to medical issues  Home Assistive Devices / Clifford Devices/Equipment: Environmental consultant (specify type);Bedside commode/3-in-1 Home Equipment: Wheelchair - manual;Crutches;Walker - 2 wheels HOme DMe already ordered and delivered to pt room 9/14. Tub bench, RW and BSC  Prior Functional Level Prior Function Level of Independence: Independent with assistive device(s) Comments: Pt was using RW for safety with ambulation. He was independent with ADLs.  Current Functional Level Cognition  Overall Cognitive Status: Within Functional Limits for tasks assessed Orientation Level: Oriented X4    Extremity Assessment (includes Sensation/Coordination)          ADLs  Overall ADL's : Needs assistance/impaired Eating/Feeding: Independent;Sitting Grooming: Wash/dry hands;Wash/dry face;Set up;Supervision/safety;Sitting Upper Body Bathing: Set up;Sitting Lower Body Bathing: Minimal assistance;Sit to/from stand Upper Body Dressing : Set up;Sitting Lower  Body Dressing: Minimal assistance;Sit to/from stand;Min guard Lower Body Dressing Details (indicate cue type and reason): Pt able to don Lt shoe for ambulation.  Toilet Transfer: Min guard;Minimal assistance;BSC Toileting- Water quality scientist and Hygiene: Sit to/from stand;Supervision/safety;Sitting/lateral lean Tub/ Banker: Total assistance Functional mobility during ADLs: Min guard;Minimal assistance General ADL Comments: Reviewed techniques for ADL completion. Pt stated he has been able to dress and bathe self with setup/supervision in sit<>stand. Able to state technique for LB dressing. Reviewed tub bench transfer. Pt has multiple steps to navigate to get to main living level on second floor at home. Hand rail on left side.    Mobility  Overal bed mobility: Needs Assistance Bed Mobility: Supine to Sit Sidelying to sit: Supervision;HOB  elevated Sit to sidelying: Min assist General bed mobility comments: Supervision for safety. VC for technique. Uses rail.    Transfers  Overall transfer level: Needs assistance Equipment used: 1 person hand held assist Transfers: Sit to/from Stand Sit to Stand: Min guard;Min assist Stand pivot transfers: Min guard;Min assist General transfer comment: Min assist for balance upon standing. LLE with minor buckling however able to self support with UEs to correct. VC for hand placement. Bed slightly elevated.    Ambulation / Gait / Stairs / Wheelchair Mobility  Ambulation/Gait Ambulation/Gait assistance: Museum/gallery curator (Feet): 15 Feet Assistive device: Rolling walker (2 wheeled) Gait Pattern/deviations:  ("hop-to" pattern) Gait velocity interpretation: Below normal speed for age/gender General Gait Details: Educated on safe DME use with rolling walker, Min assist with turns for walker placement but min guard with forward stepping. Some minor buckling of Lt knee but able to self correct. VC for walker placement intermittently.    Posture / Balance Dynamic Sitting Balance Sitting balance - Comments: leaning to his Rt    Special needs/care consideration Skin     Surgical dressing to right BKA site Bowel mgmt: continent  BM 9/14 after enema given Bladder mgmt: continent Diabetic mgmt yes   Previous Home Environment Living Arrangements: Other (Comment);Children (pt's 42 year old son, Ysidro Evert , lives with pt)  Lives With: Son (son lives with pt) Available Help at Discharge: Family;Available PRN/intermittently Type of Home: House Home Layout: Two level;1/2 bath on main level;Bed/bath upstairs Alternate Level Stairs-Rails: Left Alternate Level Stairs-Number of Steps: 13 Home Access: Stairs to enter Entrance Stairs-Rails: Left Entrance Stairs-Number of Steps: 1 small step Bathroom Shower/Tub: Chiropodist: Standard Bathroom Accessibility: Yes How  Accessible: Accessible via walker Home Care Services: No Additional Comments: Son stay with pt at d/c - daughter available intermittently  Discharge Living Setting Plans for Discharge Living Setting: Patient's home;Other (Comment) (son , Ysidro Evert, lives with pt) Type of Home at Discharge: House Discharge Home Layout: Two level;1/2 bath on main level;Bed/bath upstairs;Other (Comment) (pt plans to set up a bedroom area downstairs) Alternate Level Stairs-Rails: Left Alternate Level Stairs-Number of Steps: flight Discharge Home Access: Stairs to enter Entrance Stairs-Number of Steps: 1 Discharge Bathroom Shower/Tub: Tub/shower unit Discharge Bathroom Toilet: Standard Discharge Bathroom Accessibility: Yes How Accessible: Accessible via walker Does the patient have any problems obtaining your medications?: No Pt's son works four hours per day. Ysidro Evert has been living with his Mom and with his Dad alternately for years. This is a two level apartment. No rails to one step entry. P tplans to set up an area downstairs so he does not have to navigate flight of stairs  Social/Family/Support Systems Patient Roles: Parent Contact Information: Lilian Coma, daughter Anticipated Caregiver: daughter and son  prn Anticipated Caregiver's Contact Information: see above Ability/Limitations of Caregiver: intermittent only Caregiver Availability: Intermittent Discharge Plan Discussed with Primary Caregiver: Yes Is Caregiver In Agreement with Plan?: Yes Does Caregiver/Family have Issues with Lodging/Transportation while Pt is in Rehab?: No  Goals/Additional Needs Patient/Family Goal for Rehab: Mod I with PT and OT Expected length of stay: ELOS 7 days Dietary Needs: just has begun regular diet after severe ileus postop BKA Pt/Family Agrees to Admission and willing to participate: Yes Program Orientation Provided & Reviewed with Pt/Caregiver Including Roles  & Responsibilities: Yes Pt is asking about a  prosthetic fit for shoe on his old left transmet to assist with his balance. He [facility] that he can get for with his Medicaid he knows limited [place] available and not as aggressive.  Decrease burden of Care through IP rehab admission: n/a  Possible need for SNF placement upon discharge: no  Patient Condition: This patient's condition remains as documented in the consult dated 09/17/2013, in which the Rehabilitation Physician determined and documented that the patient's condition is appropriate for intensive rehabilitative care in an inpatient rehabilitation facility. Will admit to inpatient rehab today.  Preadmission Screen Completed By:  Cleatrice Burke, 09/18/2013 11:12 AM ______________________________________________________________________   Discussed status with Dr. Naaman Plummer on 09/18/2013 at  80 and received telephone approval for admission today.  Admission Coordinator:  Cleatrice Burke, time [id number] Date 09/18/2013.

## 2013-09-17 NOTE — Progress Notes (Signed)
Discussed with RN CM. We await medical clearance after attending MD sees pt to determine when pt medically ready for d/c to inpt rehab. Orthopedics has not yet rounded today. I have left two messages at the orthos office this afternoon.  I will follow up in the morning. [id number]

## 2013-09-17 NOTE — Progress Notes (Addendum)
Occupational Therapy Treatment Patient Details Name: [name] MRN: [id number] DOB: 1960-11-02 Today's Date: 09/17/2013    History of present illness [name] is a 53 y.o. 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 Pt has previous Lt midfoot amputation. 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 PMH of HTN, osteomyelitis, diabetic neuropathy, PAD/PVD, DM, and CHF. On 9/9 had worsening ileus/ abd pain, hypotension and acute renal failure; transfered to ICU. Pt has since returned to orthopedic unit.   OT comments  Pt making progress with functional goals and and should continue with acute OT services to increase level of function and safety  Follow Up Recommendations  CIR  Equipment Recommendations  Tub/shower bench    Recommendations for Other Services      Precautions / Restrictions Precautions Precautions: Fall Restrictions Weight Bearing Restrictions: Yes RLE Weight Bearing: Non weight bearing LLE Weight Bearing: Weight bearing as tolerated       Mobility Bed Mobility   Bed Mobility: Supine to Sit   Sidelying to sit: Supervision;HOB elevated       General bed mobility comments: Supervision for safety. VC for technique. Uses rail.  Transfers Overall transfer level: Needs assistance Equipment used: 1 person hand held assist Transfers: Sit to/from Stand Sit to Stand: Min guard;Min assist Stand pivot transfers: Min guard;Min assist            Balance   Sitting-balance support: Single extremity supported;Feet supported Sitting balance-Leahy Scale: Fair     Standing balance support: Bilateral upper extremity supported;During functional activity Standing balance-Leahy Scale: Poor                     ADL       Grooming: Wash/dry hands;Wash/dry face;Set up;Supervision/safety;Sitting               Lower Body Dressing: Minimal assistance;Sit to/from stand;Min guard   Toilet Transfer: Min guard;Minimal  assistance;BSC   Toileting- Clothing Manipulation and Hygiene: Sit to/from stand;Supervision/safety;Sitting/lateral lean       Functional mobility during ADLs: Min guard;Minimal assistance        Vision  no change from baseline                   Perception Perception Perception Tested?: No   Praxis Praxis Praxis tested?: Not tested    Cognition   Behavior During Therapy: WFL for tasks assessed/performed Overall Cognitive Status: Within Functional Limits for tasks assessed                                                 General Comments  Pt pleasant and cooperative    Pertinent Vitals/ Pain       Pain Assessment: No/denies pain  Home Living   Living Arrangements: Other (Comment);Children (pt's 83 year old son, [name] , lives with pt)                                  Lives With: Son (son lives with pt)    Prior Functioning/Environment              Frequency   2x/wk    Progress Toward Goals  OT Goals(current goals can now be found in the care plan section)  Progress  towards OT goals: Progressing toward goals     Plan Discharge plan remains appropriate    End of Session Equipment Utilized During Treatment: Other (comment);Gait belt [facility])   Activity Tolerance Patient tolerated treatment well   Patient Left with nursing/sitter in room;Other (comment);with call bell/phone within reach (on [facility])             Time: [id number] OT Time Calculation (min): 10 min  Charges: OT General Charges $OT Visit: 1 Procedure OT Treatments $Self Care/Home Management : 8-22 mins  [name] 09/17/2013, 1:33 PM

## 2013-09-18 ENCOUNTER — Inpatient Hospital Stay (HOSPITAL_COMMUNITY)
Admission: RE | Admit: 2013-09-18 | Discharge: 2013-09-28 | DRG: 945 | Disposition: A | Discharge status: Home / Self Care | Payer: Medicaid Other | Source: Intra-hospital | Attending: Physical Medicine & Rehabilitation | Admitting: Physical Medicine & Rehabilitation

## 2013-09-18 DIAGNOSIS — E785 Hyperlipidemia, unspecified: Secondary | ICD-10-CM | POA: Diagnosis present

## 2013-09-18 DIAGNOSIS — S88119A Complete traumatic amputation at level between knee and ankle, unspecified lower leg, initial encounter: Secondary | ICD-10-CM | POA: Diagnosis not present

## 2013-09-18 DIAGNOSIS — E1149 Type 2 diabetes mellitus with other diabetic neurological complication: Secondary | ICD-10-CM | POA: Diagnosis present

## 2013-09-18 DIAGNOSIS — K219 Gastro-esophageal reflux disease without esophagitis: Secondary | ICD-10-CM | POA: Diagnosis present

## 2013-09-18 DIAGNOSIS — I70269 Atherosclerosis of native arteries of extremities with gangrene, unspecified extremity: Secondary | ICD-10-CM | POA: Diagnosis present

## 2013-09-18 DIAGNOSIS — Z79899 Other long term (current) drug therapy: Secondary | ICD-10-CM | POA: Diagnosis not present

## 2013-09-18 DIAGNOSIS — E118 Type 2 diabetes mellitus with unspecified complications: Secondary | ICD-10-CM

## 2013-09-18 DIAGNOSIS — D649 Anemia, unspecified: Secondary | ICD-10-CM | POA: Diagnosis present

## 2013-09-18 DIAGNOSIS — Z8249 Family history of ischemic heart disease and other diseases of the circulatory system: Secondary | ICD-10-CM

## 2013-09-18 DIAGNOSIS — I10 Essential (primary) hypertension: Secondary | ICD-10-CM | POA: Diagnosis present

## 2013-09-18 DIAGNOSIS — E1142 Type 2 diabetes mellitus with diabetic polyneuropathy: Secondary | ICD-10-CM | POA: Diagnosis present

## 2013-09-18 DIAGNOSIS — Z5189 Encounter for other specified aftercare: Secondary | ICD-10-CM | POA: Diagnosis present

## 2013-09-18 DIAGNOSIS — E1159 Type 2 diabetes mellitus with other circulatory complications: Secondary | ICD-10-CM | POA: Diagnosis present

## 2013-09-18 DIAGNOSIS — S98919A Complete traumatic amputation of unspecified foot, level unspecified, initial encounter: Secondary | ICD-10-CM | POA: Diagnosis not present

## 2013-09-18 DIAGNOSIS — Z794 Long term (current) use of insulin: Secondary | ICD-10-CM | POA: Diagnosis not present

## 2013-09-18 DIAGNOSIS — Z602 Problems related to living alone: Secondary | ICD-10-CM

## 2013-09-18 DIAGNOSIS — Z7982 Long term (current) use of aspirin: Secondary | ICD-10-CM

## 2013-09-18 DIAGNOSIS — K560 Paralytic ileus: Secondary | ICD-10-CM | POA: Diagnosis present

## 2013-09-18 DIAGNOSIS — Z833 Family history of diabetes mellitus: Secondary | ICD-10-CM | POA: Diagnosis not present

## 2013-09-18 DIAGNOSIS — N179 Acute kidney failure, unspecified: Secondary | ICD-10-CM

## 2013-09-18 DIAGNOSIS — E1165 Type 2 diabetes mellitus with hyperglycemia: Secondary | ICD-10-CM | POA: Diagnosis present

## 2013-09-18 DIAGNOSIS — M792 Neuralgia and neuritis, unspecified: Secondary | ICD-10-CM

## 2013-09-18 DIAGNOSIS — E119 Type 2 diabetes mellitus without complications: Secondary | ICD-10-CM | POA: Diagnosis present

## 2013-09-18 DIAGNOSIS — E871 Hypo-osmolality and hyponatremia: Secondary | ICD-10-CM | POA: Diagnosis present

## 2013-09-18 LAB — CULTURE, BLOOD (ROUTINE X 2)
CULTURE: NO GROWTH
Culture: NO GROWTH

## 2013-09-18 LAB — GLUCOSE, CAPILLARY
GLUCOSE-CAPILLARY: 230 mg/dL — AB (ref 70–99)
GLUCOSE-CAPILLARY: 203 mg/dL — AB (ref 70–99)
Glucose-Capillary: 162 mg/dL — ABNORMAL HIGH (ref 70–99)
Glucose-Capillary: 195 mg/dL — ABNORMAL HIGH (ref 70–99)
Glucose-Capillary: 126 mg/dL — ABNORMAL HIGH (ref 70–99)

## 2013-09-18 MED ORDER — INSULIN ASPART 100 UNIT/ML ~~LOC~~ SOLN
0.0000 [IU] | Freq: Three times a day (TID) | SUBCUTANEOUS | Status: DC
  Administered 2013-09-18: 2 [IU] via SUBCUTANEOUS
  Administered 2013-09-19: 5 [IU] via SUBCUTANEOUS
  Administered 2013-09-19: 2 [IU] via SUBCUTANEOUS
  Administered 2013-09-19: 3 [IU] via SUBCUTANEOUS
  Administered 2013-09-20: 2 [IU] via SUBCUTANEOUS
  Administered 2013-09-20: 3 [IU] via SUBCUTANEOUS
  Administered 2013-09-20: 8 [IU] via SUBCUTANEOUS
  Administered 2013-09-21: 5 [IU] via SUBCUTANEOUS
  Administered 2013-09-21: 3 [IU] via SUBCUTANEOUS
  Administered 2013-09-22: 5 [IU] via SUBCUTANEOUS
  Administered 2013-09-22: 3 [IU] via SUBCUTANEOUS
  Administered 2013-09-22: 2 [IU] via SUBCUTANEOUS
  Administered 2013-09-23 (×3): 5 [IU] via SUBCUTANEOUS
  Administered 2013-09-24: 11 [IU] via SUBCUTANEOUS
  Administered 2013-09-24: 5 [IU] via SUBCUTANEOUS
  Administered 2013-09-24: 2 [IU] via SUBCUTANEOUS
  Administered 2013-09-25: 5 [IU] via SUBCUTANEOUS

## 2013-09-18 MED ORDER — CARVEDILOL 3.125 MG PO TABS
3.1250 mg | ORAL_TABLET | Freq: Two times a day (BID) | ORAL | Status: DC
  Administered 2013-09-18 – 2013-09-28 (×20): 3.125 mg via ORAL
  Filled 2013-09-18 (×26): qty 1

## 2013-09-18 MED ORDER — METOCLOPRAMIDE HCL 10 MG PO TABS
10.0000 mg | ORAL_TABLET | Freq: Three times a day (TID) | ORAL | Status: DC
  Administered 2013-09-18 – 2013-09-23 (×14): 10 mg via ORAL
  Filled 2013-09-18 (×18): qty 1

## 2013-09-18 MED ORDER — DIPHENHYDRAMINE HCL 12.5 MG/5ML PO ELIX: 12.5000 mg | ORAL_SOLUTION | Freq: Four times a day (QID) | ORAL | Status: DC | PRN

## 2013-09-18 MED ORDER — INSULIN ASPART 100 UNIT/ML ~~LOC~~ SOLN
4.0000 [IU] | Freq: Three times a day (TID) | SUBCUTANEOUS | Status: DC
  Administered 2013-09-18 – 2013-09-20 (×5): 4 [IU] via SUBCUTANEOUS

## 2013-09-18 MED ORDER — AMLODIPINE BESYLATE 10 MG PO TABS
10.0000 mg | ORAL_TABLET | Freq: Every day | ORAL | Status: DC
  Administered 2013-09-19 – 2013-09-28 (×10): 10 mg via ORAL
  Filled 2013-09-18 (×11): qty 1

## 2013-09-18 MED ORDER — INFLUENZA VAC SPLIT QUAD 0.5 ML IM SUSY: 0.5000 mL | PREFILLED_SYRINGE | INTRAMUSCULAR | Status: DC

## 2013-09-18 MED ORDER — METHOCARBAMOL 500 MG PO TABS: 500.0000 mg | ORAL_TABLET | Freq: Four times a day (QID) | ORAL | Status: DC | PRN

## 2013-09-18 MED ORDER — FLEET ENEMA 7-19 GM/118ML RE ENEM: 1.0000 | ENEMA | Freq: Every day | RECTAL | Status: DC | PRN

## 2013-09-18 MED ORDER — BISACODYL 10 MG RE SUPP: 10.0000 mg | Freq: Every day | RECTAL | Status: DC | PRN

## 2013-09-18 MED ORDER — ENOXAPARIN SODIUM 40 MG/0.4ML ~~LOC~~ SOLN
40.0000 mg | SUBCUTANEOUS | Status: DC
  Administered 2013-09-18 – 2013-09-27 (×10): 40 mg via SUBCUTANEOUS
  Filled 2013-09-18 (×11): qty 0.4

## 2013-09-18 MED ORDER — ONDANSETRON HCL 4 MG PO TABS: 4.0000 mg | ORAL_TABLET | Freq: Four times a day (QID) | ORAL | Status: DC | PRN

## 2013-09-18 MED ORDER — INSULIN GLARGINE 100 UNIT/ML ~~LOC~~ SOLN
40.0000 [IU] | Freq: Every day | SUBCUTANEOUS | Status: DC
  Administered 2013-09-18: 40 [IU] via SUBCUTANEOUS
  Filled 2013-09-18: qty 0.4

## 2013-09-18 MED ORDER — OXYCODONE HCL 5 MG PO TABS
5.0000 mg | ORAL_TABLET | ORAL | Status: DC | PRN
  Administered 2013-09-18 – 2013-09-27 (×14): 5 mg via ORAL
  Filled 2013-09-18 (×16): qty 1

## 2013-09-18 MED ORDER — TRAZODONE HCL 50 MG PO TABS
25.0000 mg | ORAL_TABLET | Freq: Every evening | ORAL | Status: DC | PRN
  Administered 2013-09-18 – 2013-09-27 (×9): 50 mg via ORAL
  Filled 2013-09-18 (×9): qty 1

## 2013-09-18 MED ORDER — PROCHLORPERAZINE EDISYLATE 5 MG/ML IJ SOLN
5.0000 mg | Freq: Four times a day (QID) | INTRAMUSCULAR | Status: DC | PRN
  Filled 2013-09-18: qty 2

## 2013-09-18 MED ORDER — ACETAMINOPHEN 325 MG PO TABS: 325.0000 mg | ORAL_TABLET | ORAL | Status: DC | PRN

## 2013-09-18 MED ORDER — GUAIFENESIN-DM 100-10 MG/5ML PO SYRP: 5.0000 mL | ORAL_SOLUTION | Freq: Four times a day (QID) | ORAL | Status: DC | PRN

## 2013-09-18 MED ORDER — INSULIN GLARGINE 100 UNIT/ML ~~LOC~~ SOLN
40.0000 [IU] | Freq: Every day | SUBCUTANEOUS | Status: DC
  Administered 2013-09-19: 40 [IU] via SUBCUTANEOUS
  Filled 2013-09-18 (×3): qty 0.4

## 2013-09-18 MED ORDER — ASPIRIN EC 325 MG PO TBEC
325.0000 mg | DELAYED_RELEASE_TABLET | Freq: Every day | ORAL | Status: DC
  Administered 2013-09-19 – 2013-09-28 (×10): 325 mg via ORAL
  Filled 2013-09-18 (×11): qty 1

## 2013-09-18 MED ORDER — ALUM & MAG HYDROXIDE-SIMETH 200-200-20 MG/5ML PO SUSP
30.0000 mL | ORAL | Status: DC | PRN
  Administered 2013-09-23: 30 mL via ORAL
  Filled 2013-09-18: qty 30

## 2013-09-18 MED ORDER — CETYLPYRIDINIUM CHLORIDE 0.05 % MT LIQD
7.0000 mL | Freq: Two times a day (BID) | OROMUCOSAL | Status: DC
  Administered 2013-09-19 – 2013-09-27 (×16): 7 mL via OROMUCOSAL

## 2013-09-18 MED ORDER — CHLORHEXIDINE GLUCONATE 0.12 % MT SOLN
15.0000 mL | Freq: Two times a day (BID) | OROMUCOSAL | Status: DC
  Administered 2013-09-18 – 2013-09-28 (×19): 15 mL via OROMUCOSAL
  Filled 2013-09-18 (×25): qty 15

## 2013-09-18 MED ORDER — POLYETHYLENE GLYCOL 3350 17 G PO PACK
17.0000 g | PACK | Freq: Two times a day (BID) | ORAL | Status: DC
  Administered 2013-09-18 – 2013-09-28 (×19): 17 g via ORAL
  Filled 2013-09-18 (×24): qty 1

## 2013-09-18 MED ORDER — TRAMADOL HCL 50 MG PO TABS
50.0000 mg | ORAL_TABLET | Freq: Four times a day (QID) | ORAL | Status: DC | PRN
  Administered 2013-09-18: 50 mg via ORAL
  Filled 2013-09-18: qty 1

## 2013-09-18 MED ORDER — PRO-STAT SUGAR FREE PO LIQD
30.0000 mL | Freq: Three times a day (TID) | ORAL | Status: DC
  Administered 2013-09-18 – 2013-09-19 (×4): 30 mL via ORAL
  Administered 2013-09-20 (×3): via ORAL
  Administered 2013-09-21 (×3): 30 mL via ORAL
  Administered 2013-09-22: 12:00:00 via ORAL
  Administered 2013-09-22: 30 mL via ORAL
  Administered 2013-09-22 – 2013-09-23 (×2): via ORAL
  Administered 2013-09-23 – 2013-09-28 (×15): 30 mL via ORAL
  Filled 2013-09-18 (×32): qty 30

## 2013-09-18 MED ORDER — ONDANSETRON HCL 4 MG/2ML IJ SOLN: 4.0000 mg | Freq: Four times a day (QID) | INTRAMUSCULAR | Status: DC | PRN

## 2013-09-18 MED ORDER — PROCHLORPERAZINE MALEATE 5 MG PO TABS
5.0000 mg | ORAL_TABLET | Freq: Four times a day (QID) | ORAL | Status: DC | PRN
  Filled 2013-09-18: qty 2

## 2013-09-18 MED ORDER — INSULIN ASPART 100 UNIT/ML ~~LOC~~ SOLN
0.0000 [IU] | Freq: Every day | SUBCUTANEOUS | Status: DC
  Administered 2013-09-18 – 2013-09-19 (×2): 2 [IU] via SUBCUTANEOUS
  Administered 2013-09-20: 5 [IU] via SUBCUTANEOUS
  Administered 2013-09-21: 4 [IU] via SUBCUTANEOUS
  Administered 2013-09-22: 2 [IU] via SUBCUTANEOUS
  Administered 2013-09-23: 3 [IU] via SUBCUTANEOUS
  Administered 2013-09-24: 2 [IU] via SUBCUTANEOUS

## 2013-09-18 MED ORDER — PROCHLORPERAZINE 25 MG RE SUPP
12.5000 mg | Freq: Four times a day (QID) | RECTAL | Status: DC | PRN
  Filled 2013-09-18: qty 1

## 2013-09-18 MED ORDER — PANTOPRAZOLE SODIUM 40 MG PO TBEC
40.0000 mg | DELAYED_RELEASE_TABLET | Freq: Every day | ORAL | Status: DC
  Administered 2013-09-19 – 2013-09-28 (×10): 40 mg via ORAL
  Filled 2013-09-18 (×14): qty 1

## 2013-09-18 NOTE — Progress Notes (Signed)
Pt transferred to Rehab from 5N. Alert and orientated x 4. Orientated to Rehab scheduled, and expectations. Diagnostic specific information provided.
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
## 2013-09-18 NOTE — Progress Notes (Signed)
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 Required significant narcotics for pain control post op and developed ileus. On 9/9 had worsening ileus/ abd pain, hypotension and acute renal failure and PCCM consulted and patient was transferred to ICU.  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  - Management per primary team, orthopedics - Patient interested in CIR, patient is accepted by CIR    Active Problems: Acute renal failure likely due to sepsis, ileus- resolved, DC'ed IVF    Ileus, postoperative - improving, bowel movement yesterday, continue reglan TID AC   Constipation;  - Continue scheduled colace, miralax, dulcolax supp as needed, and I see     Diabetes mellitus type 2, uncontrolled, with complications; BS somewhat better controlled today  - Continue sliding scale insulin, inc'd lantus to 40 units and meal coverage    HYPERTENSION - continue Coreg, amlodipine, inc to 10mg  - hydralazine PRN    Anemia: Anemia of chronic disease, ferritin 666 - H&H low, 7.4 9/13, likely postop - transfused 2 units 9/13, Hb stable at 9.3    Severe sepsis(995.92)- likely gut translocation, history of osteomyelitis- resolved - was on vancomycin and Zosyn, discontinued by CCM on 9/11 - Afebrile, no leukocytosis   DVT Prophylaxis:SCDs   Code Status:Full code   Family Communication:  Disposition: Per primary team , patient medically stable to dc to CIR, I will sign off     Subjective: Patient seen and examined, feels better today, did have BM yesterday, tolerating  solids  Objective: Weight change: 1.6 kg (3 lb 8.4 oz)  Intake/Output Summary (Last 24 hours) at 09/18/13 0958 Last data filed at 09/18/13 0625  Gross per 24 hour  Intake    840 ml  Output   2250 ml  Net  -1410 ml   Blood pressure 147/62, pulse 87, temperature 98.2 F (36.8 C), temperature source Oral, resp. rate 18, height 6' (1.829 m), weight 86.1 kg (189 lb 13.1 oz), SpO2 99.00%.  Physical Exam: General: Alert and awake, oriented x3, NAD CVS: S1-S2 clear, no murmur rubs or gallops Chest: clear to auscultation bilaterally, no wheezing, rales or rhonchi Abdomen: soft nontender, distension improving, normal BS Extremities: right BKA   Lab Results: Basic Metabolic Panel:  Recent Labs Lab 09/11/13 1650  09/16/13 0553 09/17/13 0435  NA 136*  < > 136* 135*  K 5.0  < > 4.1 4.2  CL 88*  < > 100 101  CO2 30  < > 25 22  GLUCOSE 213*  < > 210* 248*  BUN 43*  < > 19 14  CREATININE 3.62*  < > 1.13 0.99  CALCIUM 9.3  < > 8.1* 8.3*  MG 2.6*  --   --   --   < > =  values in this interval not displayed. Liver Function Tests:  Recent Labs Lab 09/12/13 1015  AST 72*  ALT 64*  ALKPHOS 245*  BILITOT 0.5  PROT 8.1  ALBUMIN 2.4*    Recent Labs Lab 09/12/13 1600  LIPASE 11  AMYLASE 31   No results found for this basename: AMMONIA,  in the last 168 hours CBC:  Recent Labs Lab 09/16/13 0553 09/17/13 0435  WBC 9.8 9.6  HGB 7.4* 9.3*  HCT 23.5* 28.6*  MCV 81.6 82.2  PLT 396 396   Cardiac Enzymes:  Recent Labs Lab 09/12/13 1338 09/12/13 1851 09/13/13 0205  TROPONINI <0.30 <0.30 <0.30   BNP: No components found with this basename: POCBNP,  CBG:  Recent Labs Lab 09/17/13 1217 09/17/13 1654 09/17/13 2119 09/18/13 0644 09/18/13 0848  GLUCAP 102* 190* 162* 162* 195*     Micro Results: Recent Results (from the past 240 hour(s))  MRSA PCR SCREENING     Status: None   Collection Time    09/12/13  1:47 PM      Result Value Ref Range Status   MRSA by  PCR NEGATIVE  NEGATIVE Final   Comment:            The GeneXpert MRSA Assay (FDA     approved for NASAL specimens     only), is one component of a     comprehensive MRSA colonization     surveillance program. It is not     intended to diagnose MRSA     infection nor to guide or     monitor treatment for     MRSA infections.  URINE CULTURE     Status: None   Collection Time    09/12/13  3:40 PM      Result Value Ref Range Status   Specimen Description URINE, CATHETERIZED   Final   Special Requests NONE   Final   Culture  Setup Time     Final   Value: 09/12/2013 20:36     Performed at [facility]     Final   Value: NO GROWTH     Performed at [facility]   Culture     Final   Value: NO GROWTH     Performed at [facility]   Report Status 09/13/2013 FINAL   Final  CULTURE, BLOOD (ROUTINE X 2)     Status: None   Collection Time    09/12/13  4:10 PM      Result Value Ref Range Status   Specimen Description BLOOD RIGHT ARM   Final   Special Requests BOTTLES DRAWN AEROBIC AND ANAEROBIC 10CC   Final   Culture  Setup Time     Final   Value: 09/12/2013 20:32     Performed at [facility]   Culture     Final   Value:        BLOOD CULTURE RECEIVED NO GROWTH TO DATE CULTURE WILL BE HELD FOR 5 DAYS BEFORE ISSUING A FINAL NEGATIVE REPORT     Performed at [facility]   Report Status PENDING   Incomplete  CULTURE, BLOOD (ROUTINE X 2)     Status: None   Collection Time    09/12/13  4:16 PM      Result Value Ref Range Status   Specimen Description BLOOD RIGHT HAND   Final   Special Requests BOTTLES DRAWN AEROBIC AND ANAEROBIC 10CC   Final  Culture  Setup Time     Final   Value: 09/12/2013 20:32     Performed at [facility]   Culture     Final   Value:        BLOOD CULTURE RECEIVED NO GROWTH TO DATE CULTURE WILL BE HELD FOR 5 DAYS BEFORE ISSUING A FINAL NEGATIVE REPORT     Performed at [facility]   Report  Status PENDING   Incomplete    Studies/Results: Dg Chest 2 View  09/07/2013   CLINICAL DATA:  Preoperative prior to right leg amputation; history of diabetes, hypertension, and previous tobacco use.  EXAM: CHEST  2 VIEW  COMPARISON:  PA and lateral chest of August 30, 2013  FINDINGS: The lungs are adequately inflated and clear. The heart and pulmonary vascularity are normal. The mediastinum is normal in width. There is no pleural effusion. The bony thorax is unremarkable.  IMPRESSION: There is no active cardiopulmonary disease.   Electronically Signed   By: David  Martinique   On: 09/07/2013 15:32   Dg Chest 2 View  08/31/2013   CLINICAL DATA:  Several day history of cough and congestion  EXAM: CHEST  2 VIEW  COMPARISON:  .  PA and lateral chest x-ray of May 11, 2013  FINDINGS: The lungs are adequately inflated. There is is increased density posteriorly on the lateral film which may lie on the right at and just below the medial aspect of the hemidiaphragm on the frontal film. The cardiac silhouette and pulmonary vascularity are normal. There is no pleural effusion or pneumothorax. The mediastinum is normal in width. The bony thorax is unremarkable.  IMPRESSION: There is increased density presumably in the right lower lobe posteriorly consistent with atelectasis or focal pneumonia. Overlap of normal bony structures may be contributing to the density. Followup films following therapy are recommended to assure complete clearing.   Electronically Signed   By: David  Martinique   On: 08/31/2013 08:16   Dg Abd 1 View  09/10/2013   CLINICAL DATA:  Postop nausea, vomiting and constipation. Stabbing right upper quadrant pain.  EXAM: ABDOMEN - 1 VIEW  COMPARISON:  05/10/2013.  FINDINGS: Stool is seen throughout the colon. Single dilated loop of small bowel seen in the left abdomen, nonspecific. Atherosclerotic calcification of the arterial vasculature.  IMPRESSION: Bowel gas pattern is indicative of constipation.    Electronically Signed   By: Lorin Picket M.D.   On: 09/10/2013 15:27   US Renal  09/12/2013   CLINICAL DATA:  Acute renal failure.  EXAM: RENAL/URINARY TRACT ULTRASOUND COMPLETE  COMPARISON:  None.  FINDINGS: Right Kidney:  Length: 11.0 cm. Echogenicity within normal limits. No mass or hydronephrosis visualized.  Left Kidney:  Length: 11.1 cm. Suboptimally visualized due to adjacent bowel, limited acoustic window, and maceration. No hydronephrosis or gross abnormality identified.  Bladder:  Decompressed by Foley catheter.  IMPRESSION: No hydronephrosis. Unremarkable right and grossly unremarkable left kidneys.   Electronically Signed   By: Logan Bores   On: 09/12/2013 09:33   Dg Chest Port 1 View  09/12/2013   CLINICAL DATA:  Central line inserted, check position  EXAM: PORTABLE CHEST - 1 VIEW  COMPARISON:  09/07/2013  FINDINGS: Limited inspiratory effect. Enlargement of cardiac silhouette. NG tube extends into the stomach. Right internal jugular central line has been placed with tip to the cavoatrial junction. No pneumothorax. Bilateral vascular congestion.  IMPRESSION: Central line as described with no pneumothorax.   Electronically Signed  By: Skipper Cliche M.D.   On: 09/12/2013 14:44   Dg Abd Portable 1v  09/15/2013   CLINICAL DATA:  Ileus  EXAM: PORTABLE ABDOMEN - 1 VIEW  COMPARISON:  09/12/2013  FINDINGS: NG tube identified within the stomach. There is central abdominal small bowel dilatation. Maximal diameter is 3.8 cm, decreased from 4.4 cm previously. There is little gas in the colon with a small amount of air in the rectum. The bladder is mildly distended.  IMPRESSION: Improving small bowel dilatation. Minimal gas into the colon. Improving small bowel ileus suspected, although low-grade partial small bowel obstruction not excluded.   Electronically Signed   By: Skipper Cliche M.D.   On: 09/15/2013 08:52   Dg Abd Portable 1v  09/12/2013   ADDENDUM REPORT: 09/12/2013 08:54  ADDENDUM: These  results will be called to the ordering clinician or representative by the Radiologist Assistant, and communication documented in the PACS or zVision Dashboard.   Electronically Signed   By: Logan Bores   On: 09/12/2013 08:54   09/12/2013   CLINICAL DATA:  Abdominal distension.  EXAM: PORTABLE ABDOMEN - 1 VIEW  COMPARISON:  09/10/2013  FINDINGS: Stool is again seen throughout the colon. Gas is present in the sigmoid colon and rectum. Multiple loops of gas-filled, dilated small bowel are present in the central abdomen measuring up to 4.4 cm in diameter, increased from the prior study. No gross intraperitoneal free air on the supine images.  IMPRESSION: Increasing gaseous small bowel dilatation with persistent stool throughout the colon and distal colonic gas. Findings are suggestive of worsening postoperative ileus, although a developing small bowel obstruction is not excluded.  Electronically Signed: By: Logan Bores On: 09/12/2013 08:43    Medications: Scheduled Meds: . sodium chloride   Intravenous Once  . amLODipine  10 mg Oral Daily  . antiseptic oral rinse  7 mL Mouth Rinse q12n4p  . aspirin EC  325 mg Oral Daily  . carvedilol  3.125 mg Oral BID WC  . chlorhexidine  15 mL Mouth Rinse BID  . docusate sodium  100 mg Oral BID  . feeding supplement (GLUCERNA SHAKE)  237 mL Oral BID BM  . [START ON 09/19/2013] Influenza vac split quadrivalent PF  0.5 mL Intramuscular Tomorrow-1000  . insulin aspart  0-15 Units Subcutaneous TID WC  . insulin aspart  0-5 Units Subcutaneous QHS  . insulin aspart  4 Units Subcutaneous TID WC  . insulin glargine  30 Units Subcutaneous Daily  . metoCLOPramide  10 mg Oral TID AC  . pantoprazole  40 mg Oral Q0600  . polyethylene glycol  17 g Oral Daily  . sodium chloride  10-40 mL Intracatheter Q12H  . sodium phosphate  1 enema Rectal Once      LOS: 11 days   RAI,RIPUDEEP M.D. [facility] 09/18/2013, 9:58 AM Pager: [phone]  If 7PM-7AM, please contact  night-coverage [email or web address] Password [email or web address]  **Disclaimer: This note was dictated with voice recognition software. Similar sounding words can inadvertently be transcribed and this note may contain transcription errors which may not have been corrected upon publication of note.**

## 2013-09-18 NOTE — Progress Notes (Signed)
Chart and note reviewed. Agree with note.  Sapir Lavey, RD, LDN, CNSC Pager [phone] After Hours Pager [phone]   

## 2013-09-18 NOTE — Progress Notes (Signed)
Noted Dr. Erlinda Hong in agreement to d/c pt to inpt rehab today. I will make arrangements for today.Pt is in agreement. [id number]

## 2013-09-18 NOTE — Progress Notes (Signed)
PMR Admission Coordinator Pre-Admission Assessment   Patient: [name] is an 53 y.o., male MRN: [id number] DOB: [birth year] Height: 6' (182.9 cm) Weight: 86.1 kg (189 lb 13.1 oz)                                                                                                                                                  Insurance Information HMO:     PPO:      PCP:      IPA:      80/20:      OTHER:   PRIMARY: Medicaid [place] Access      Policy#: [id number]      Subscriber: pt   Medicaid Application Date:       Case Manager:  Disability Application Date:       Case Worker:    Emergency Contact Information Contact Information     Name  Relation  Home  Work  Mobile     Hollenkamp,Jemeca  Daughter  [phone]    [phone]     Tawanna Sat  Sister  [phone]         [place], Hyneman      [phone]       Current Medical History  Patient Admitting Diagnosis: right BKA, hx of left TMA   History of Present Illness: [name] is a 53 y.o. 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    He was admitted on 09/07/13 for R-BKA by Dr. Sharol Given due to progressive dehiscence and necrosis of right foot due to gangrenous changes. Post op with pain requiring high doses of narcotics as well as constipation with abdominal distension. On 09/09, he developed ileus with lethargy and mental status changes due to acute renal failure due to ATN from hypotension. He was transferred to step down unit and treated with aggressive volume resuscitation with empiric antibiotics NGT as well as bowel rest. Foley placed and renal u/s without hydronephrosis. Ileus resolving and NGT discontinued and diet has been advanced to soft. Mentation has improved but patient now deconditioned .   Past Medical History  Past Medical History   Diagnosis  Date   .  Hyperlipidemia     .  Hypertension     .   Osteomyelitis  2010       left foot, s/p midfoot amputation   .  Osteomyelitis of ankle or foot  05/2011       rt foot, s/p 5th ray amputation   .  Neuromuscular disorder         diabetic neruopathy   .  PAD (peripheral artery disease)         ABIs 11/30/11: L ABI 0.68, R ABI 0.84   .  Pneumonia  2010   .  Critical lower limb ischemia, lt with ABI of 0.60  12/31/2011   .  PVD (peripheral vascular disease)  12/31/2011   .  S/P angioplasty with stent, 12/30/11, of Lt SFA, Post. tibialis and PTA of L. ant and post. tibial vessels  12/31/2011   .  Type II diabetes mellitus  ~ 2002   .  GERD (gastroesophageal reflux disease)     .  CHF (congestive heart failure)     .  Gangrene         right foot   .  Osteomyelitis  09/2013       RT BKA      Family History  family history includes Diabetes in his mother; Hypertension in his brother and sister. There is no history of Anesthesia problems.   Prior Rehab/Hospitalizations: none   Current Medications  Current facility-administered medications:0.9 %  sodium chloride infusion, , Intravenous, Once, Ripudeep K Rai, MD;  amLODipine (NORVASC) tablet 10 mg, 10 mg, Oral, Daily, Ripudeep K Rai, MD, 10 mg at 09/18/13 1024;  antiseptic oral rinse (CPC / CETYLPYRIDINIUM CHLORIDE 0.05%) solution 7 mL, 7 mL, Mouth Rinse, q12n4p, Wilhelmina Mcardle, MD, 7 mL at 09/17/13 1844 aspirin EC tablet 325 mg, 325 mg, Oral, Daily, Ripudeep K Rai, MD, 325 mg at 09/18/13 1024;  bisacodyl (DULCOLAX) EC tablet 5 mg, 5 mg, Oral, Daily PRN, Meridee Score V, MD, 5 mg at 09/11/13 2148;  bisacodyl (DULCOLAX) suppository 10 mg, 10 mg, Rectal, Daily PRN, Marybelle Killings, MD, 10 mg at 09/10/13 1735;  carvedilol (COREG) tablet 3.125 mg, 3.125 mg, Oral, BID WC, Donita Brooks, NP, 3.125 mg at 09/18/13 1024 chlorhexidine (PERIDEX) 0.12 % solution 15 mL, 15 mL, Mouth Rinse, BID, Wilhelmina Mcardle, MD, 15 mL at 09/17/13 1844;  docusate sodium (COLACE) capsule 100 mg, 100 mg, Oral, BID, Ripudeep K  Rai, MD, 100 mg at 09/18/13 1024;  feeding supplement (GLUCERNA SHAKE) (GLUCERNA SHAKE) liquid 237 mL, 237 mL, Oral, BID BM, Dalene Carrow, RD, 237 mL at 09/18/13 1024 hydrALAZINE (APRESOLINE) injection 10 mg, 10 mg, Intravenous, Q6H PRN, Ripudeep Krystal Eaton, MD;  Derrill Memo ON 09/19/2013] Influenza vac split quadrivalent PF (FLUARIX) injection 0.5 mL, 0.5 mL, Intramuscular, Tomorrow-1000, Meridee Score V, MD;  insulin aspart (novoLOG) injection 0-15 Units, 0-15 Units, Subcutaneous, TID WC, Ripudeep K Rai, MD, 3 Units at 09/18/13 0849 insulin aspart (novoLOG) injection 0-5 Units, 0-5 Units, Subcutaneous, QHS, Ripudeep K Rai, MD;  insulin aspart (novoLOG) injection 4 Units, 4 Units, Subcutaneous, TID WC, Ripudeep K Rai, MD, 4 Units at 09/18/13 0850;  insulin glargine (LANTUS) injection 40 Units, 40 Units, Subcutaneous, Daily, Ripudeep K Rai, MD;  metoCLOPramide (REGLAN) tablet 10 mg, 10 mg, Oral, TID AC, Ripudeep K Rai, MD, 10 mg at 09/17/13 1843 morphine 2 MG/ML injection 1-2 mg, 1-2 mg, Intravenous, Q4H PRN, Doree Fudge, MD, 2 mg at 09/18/13 1025;  ondansetron (ZOFRAN) injection 4 mg, 4 mg, Intravenous, Q6H PRN, Meridee Score V, MD, 4 mg at 09/13/13 0406;  ondansetron (ZOFRAN) tablet 4 mg, 4 mg, Oral, Q6H PRN, Meridee Score V, MD, 4 mg at 09/11/13 1008;  pantoprazole (PROTONIX) EC tablet 40 mg, 40 mg, Oral, Q0600, Ripudeep K Rai, MD, 40 mg at 09/18/13 [id number] polyethylene glycol (MIRALAX / GLYCOLAX) packet 17 g, 17 g, Oral, Daily, Ripudeep K Rai, MD, 17 g at 09/18/13 1024;  sodium chloride 0.9 % bolus 1,000 mL, 1,000 mL, Intravenous, PRN, Debbe Odea, MD;  sodium chloride 0.9 % injection 10-40 mL, 10-40  mL, Intracatheter, Q12H, Raylene Miyamoto, MD, 10 mL at 09/17/13 1027;  sodium chloride 0.9 % injection 10-40 mL, 10-40 mL, Intracatheter, PRN, Raylene Miyamoto, MD, 30 mL at 09/17/13 0439 sodium phosphate (FLEET) 7-19 GM/118ML enema 1 enema, 1 enema, Rectal, Once, Ripudeep Krystal Eaton, MD   Patients  Current Diet: Bland   Precautions / Restrictions Precautions Precautions: Fall Precaution Comments: educated pt on no pillow behind knee Restrictions Weight Bearing Restrictions: Yes RLE Weight Bearing: Non weight bearing LLE Weight Bearing: Weight bearing as tolerated    Prior Activity Level Limited Community (1-2x/wk): limited due to medical issues   Home Assistive Devices / [place] Devices/Equipment: Environmental consultant (specify type);Bedside commode/3-in-1 Home Equipment: Wheelchair - manual;Crutches;Walker - 2 wheels HOme DMe already ordered and delivered to pt room 9/14. Tub bench, RW and BSC   Prior Functional Level Prior Function Level of Independence: Independent with assistive device(s) Comments: Pt was using RW for safety with ambulation. He was independent with ADLs.   Current Functional Level Cognition   Overall Cognitive Status: Within Functional Limits for tasks assessed Orientation Level: Oriented X4     Extremity Assessment (includes Sensation/Coordination)          ADLs   Overall ADL's : Needs assistance/impaired Eating/Feeding: Independent;Sitting Grooming: Wash/dry hands;Wash/dry face;Set up;Supervision/safety;Sitting Upper Body Bathing: Set up;Sitting Lower Body Bathing: Minimal assistance;Sit to/from stand Upper Body Dressing : Set up;Sitting Lower Body Dressing: Minimal assistance;Sit to/from stand;Min guard Lower Body Dressing Details (indicate cue type and reason): Pt able to don Lt shoe for ambulation.   Toilet Transfer: Min guard;Minimal assistance;BSC Toileting- Water quality scientist and Hygiene: Sit to/from stand;Supervision/safety;Sitting/lateral lean Tub/ Banker: Total assistance Functional mobility during ADLs: Min guard;Minimal assistance General ADL Comments: Reviewed techniques for ADL completion. Pt stated he has been able to dress and bathe self with setup/supervision in sit<>stand. Able to state technique for LB  dressing. Reviewed tub bench transfer. Pt has multiple steps to navigate to get to main living level on second floor at home. Hand rail on left side.      Mobility   Overal bed mobility: Needs Assistance Bed Mobility: Supine to Sit Sidelying to sit: Supervision;HOB elevated Sit to sidelying: Min assist General bed mobility comments: Supervision for safety. VC for technique. Uses rail.      Transfers   Overall transfer level: Needs assistance Equipment used: 1 person hand held assist Transfers: Sit to/from Stand Sit to Stand: Min guard;Min assist Stand pivot transfers: Min guard;Min assist General transfer comment: Min assist for balance upon standing. LLE with minor buckling however able to self support with UEs to correct. VC for hand placement. Bed slightly elevated.      Ambulation / Gait / Stairs / Wheelchair Mobility   Ambulation/Gait Ambulation/Gait assistance: Museum/gallery curator (Feet): 15 Feet Assistive device: Rolling walker (2 wheeled) Gait Pattern/deviations:  ("hop-to" pattern) Gait velocity interpretation: Below normal speed for age/gender General Gait Details: Educated on safe DME use with rolling walker, Min assist with turns for walker placement but min guard with forward stepping. Some minor buckling of Lt knee but able to self correct. VC for walker placement intermittently.      Posture / Balance  Dynamic Sitting Balance Sitting balance - Comments: leaning to his Rt      Special needs/care consideration  Skin     Surgical dressing to right BKA site Bowel mgmt: continent  BM 9/14 after enema given Bladder mgmt: continent Diabetic mgmt yes  Previous Home Environment Living Arrangements: Other (Comment);Children (pt's 31 year old son, Ysidro Evert , lives with pt)  Lives With: Son (son lives with pt) Available Help at Discharge: Family;Available PRN/intermittently Type of Home: House Home Layout: Two level;1/2 bath on main level;Bed/bath  upstairs Alternate Level Stairs-Rails: Left Alternate Level Stairs-Number of Steps: 13 Home Access: Stairs to enter Entrance Stairs-Rails: Left Entrance Stairs-Number of Steps: 1 small step Bathroom Shower/Tub: Chiropodist: Standard Bathroom Accessibility: Yes How Accessible: Accessible via walker Home Care Services: No Additional Comments: Son stay with pt at d/c - daughter available intermittently   Discharge Living Setting Plans for Discharge Living Setting: Patient's home;Other (Comment) (son , Ysidro Evert, lives with pt) Type of Home at Discharge: House Discharge Home Layout: Two level;1/2 bath on main level;Bed/bath upstairs;Other (Comment) (pt plans to set up a bedroom area downstairs) Alternate Level Stairs-Rails: Left Alternate Level Stairs-Number of Steps: flight Discharge Home Access: Stairs to enter Entrance Stairs-Number of Steps: 1 Discharge Bathroom Shower/Tub: Tub/shower unit Discharge Bathroom Toilet: Standard Discharge Bathroom Accessibility: Yes How Accessible: Accessible via walker Does the patient have any problems obtaining your medications?: No Pt's son works four hours per day. Ysidro Evert has been living with his Mom and with his Dad alternately for years. This is a two level apartment. No rails to one step entry. P tplans to set up an area downstairs so he does not have to navigate flight of stairs   Social/Family/Support Systems Patient Roles: Parent Contact Information: Lilian Coma, daughter Anticipated Caregiver: daughter and son prn Anticipated Caregiver's Contact Information: see above Ability/Limitations of Caregiver: intermittent only Caregiver Availability: Intermittent Discharge Plan Discussed with Primary Caregiver: Yes Is Caregiver In Agreement with Plan?: Yes Does Caregiver/Family have Issues with Lodging/Transportation while Pt is in Rehab?: No   Goals/Additional Needs Patient/Family Goal for Rehab: Mod I with PT and  OT Expected length of stay: ELOS 7 days Dietary Needs: just has begun regular diet after severe ileus postop BKA Pt/Family Agrees to Admission and willing to participate: Yes Program Orientation Provided & Reviewed with Pt/Caregiver Including Roles  & Responsibilities: Yes Pt is asking about a prosthetic fit for shoe on his old left transmet to assist with his balance. He wants as much rehab at [facility] that he can get for with his Medicaid he knows limited [place] available and not as aggressive.   Decrease burden of Care through IP rehab admission: n/a   Possible need for SNF placement upon discharge: no   Patient Condition: This patient's condition remains as documented in the consult dated 09/17/2013, in which the Rehabilitation Physician determined and documented that the patient's condition is appropriate for intensive rehabilitative care in an inpatient rehabilitation facility. Will admit to inpatient rehab today.   Preadmission Screen Completed By:  Cleatrice Burke, 09/18/2013 11:12 AM ______________________________________________________________________    Discussed status with Dr. Naaman Plummer on 09/18/2013 at  31 and received telephone approval for admission today.   Admission Coordinator:  Cleatrice Burke, time [id number] Date 09/18/2013.          Cosigned by: Meredith Staggers, MD    [09/18/2013 11:56 AM]  Revision History...     Date/Time User Action   09/18/2013 11:56 AM Meredith Staggers, MD Cosign   09/18/2013 11:12 AM Cleatrice Burke, RN Sign   09/18/2013 11:12 AM Cleatrice Burke, RN Sign  View Details Report
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
## 2013-09-19 ENCOUNTER — Inpatient Hospital Stay (HOSPITAL_COMMUNITY): Payer: Medicaid Other | Admitting: Physical Therapy

## 2013-09-19 ENCOUNTER — Inpatient Hospital Stay (HOSPITAL_COMMUNITY): Payer: Medicaid Other | Admitting: Occupational Therapy

## 2013-09-19 DIAGNOSIS — N179 Acute kidney failure, unspecified: Secondary | ICD-10-CM

## 2013-09-19 DIAGNOSIS — S88119A Complete traumatic amputation at level between knee and ankle, unspecified lower leg, initial encounter: Secondary | ICD-10-CM

## 2013-09-19 LAB — CBC WITH DIFFERENTIAL/PLATELET
Basophils Absolute: 0 10*3/uL (ref 0.0–0.1)
Basophils Relative: 0 % (ref 0–1)
EOS ABS: 0.1 10*3/uL (ref 0.0–0.7)
EOS PCT: 1 % (ref 0–5)
HCT: 30.1 % — ABNORMAL LOW (ref 39.0–52.0)
Hemoglobin: 9.7 g/dL — ABNORMAL LOW (ref 13.0–17.0)
LYMPHS ABS: 2.1 10*3/uL (ref 0.7–4.0)
Lymphocytes Relative: 21 % (ref 12–46)
MCH: 26.8 pg (ref 26.0–34.0)
MCHC: 32.2 g/dL (ref 30.0–36.0)
MCV: 83.1 fL (ref 78.0–100.0)
MONO ABS: 0.6 10*3/uL (ref 0.1–1.0)
Monocytes Relative: 6 % (ref 3–12)
Neutro Abs: 7.3 10*3/uL (ref 1.7–7.7)
Neutrophils Relative %: 72 % (ref 43–77)
PLATELETS: 423 10*3/uL — AB (ref 150–400)
RBC: 3.62 MIL/uL — ABNORMAL LOW (ref 4.22–5.81)
RDW: 15.2 % (ref 11.5–15.5)
WBC: 10.1 10*3/uL (ref 4.0–10.5)

## 2013-09-19 LAB — COMPREHENSIVE METABOLIC PANEL
ALT: 38 U/L (ref 0–53)
AST: 18 U/L (ref 0–37)
Albumin: 2.3 g/dL — ABNORMAL LOW (ref 3.5–5.2)
Alkaline Phosphatase: 178 U/L — ABNORMAL HIGH (ref 39–117)
Anion gap: 10 (ref 5–15)
BUN: 18 mg/dL (ref 6–23)
CALCIUM: 8.7 mg/dL (ref 8.4–10.5)
CO2: 24 mEq/L (ref 19–32)
CREATININE: 1.09 mg/dL (ref 0.50–1.35)
Chloride: 98 mEq/L (ref 96–112)
GFR calc non Af Amer: 76 mL/min — ABNORMAL LOW (ref >90)
GFR, EST AFRICAN AMERICAN: 88 mL/min — AB (ref >90)
GLUCOSE: 241 mg/dL — AB (ref 70–99)
Potassium: 4.7 mEq/L (ref 3.7–5.3)
SODIUM: 132 meq/L — AB (ref 137–147)
Total Bilirubin: 0.3 mg/dL (ref 0.3–1.2)
Total Protein: 6.7 g/dL (ref 6.0–8.3)

## 2013-09-19 LAB — GLUCOSE, CAPILLARY
GLUCOSE-CAPILLARY: 234 mg/dL — AB (ref 70–99)
Glucose-Capillary: 155 mg/dL — ABNORMAL HIGH (ref 70–99)
Glucose-Capillary: 146 mg/dL — ABNORMAL HIGH (ref 70–99)
Glucose-Capillary: 249 mg/dL — ABNORMAL HIGH (ref 70–99)

## 2013-09-19 MED ORDER — GABAPENTIN 300 MG PO CAPS
300.0000 mg | ORAL_CAPSULE | Freq: Three times a day (TID) | ORAL | Status: DC
  Administered 2013-09-19 – 2013-09-28 (×26): 300 mg via ORAL
  Filled 2013-09-19 (×31): qty 1

## 2013-09-19 MED ORDER — HYDROCERIN EX CREA
TOPICAL_CREAM | Freq: Two times a day (BID) | CUTANEOUS | Status: DC
  Administered 2013-09-19 – 2013-09-28 (×18): via TOPICAL
  Filled 2013-09-19: qty 113

## 2013-09-19 NOTE — Progress Notes (Signed)
Patient information reviewed and entered into e[facility] system by Kyzen Horn, RN, CRRN, PPS Coordinator.  Information including medical coding and functional independence measure will be reviewed and updated through discharge.    
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
## 2013-09-19 NOTE — Progress Notes (Signed)
Inpatient Diabetes Program Recommendations  AACE/ADA: New Consensus Statement on Inpatient Glycemic Control (2013)  Target Ranges:  Prepandial:   less than 140 mg/dL      Peak postprandial:   less than 180 mg/dL (1-2 hours)      Critically ill patients:  140 - 180 mg/dL   Results for RICKIE, COYE (MRN [id number]) as of 09/19/2013 08:27  Ref. Range 09/18/2013 08:48 09/18/2013 12:15 09/18/2013 16:32 09/18/2013 20:51 09/19/2013 06:49  Glucose-Capillary Latest Range: 70-99 mg/dL 195 (H) 230 (H) 126 (H) 203 (H) 234 (H)    Diabetes history: DM2 Outpatient Diabetes medications: Lantus 70 units QHS, Novolog 11-27 units TID with meals, Metformin 1000 mg BID Current orders for Inpatient glycemic control: Lantus 40 units daily, Novolog 0-15 units AC, Novolog 0-5 units HS, Novolog 4 units TID with meals  Inpatient Diabetes Program Recommendations Insulin - Basal: Please consider increasing Lantus to 42 units daily. Insulin - Meal Coverage: Please consider increasing meal coverage to Novolog 5 units TID with meals.  Thanks, Barnie Alderman, RN, MSN, CCRN Diabetes Coordinator Inpatient Diabetes Program [phone] (Team Pager) [phone] ([facility] office) [phone] [facility] office)
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
## 2013-09-19 NOTE — Progress Notes (Signed)
Patient reporting neuropathic symptoms and question resumption of neurontin. Records reveal Neurontin 600 mg tid as home medication. Will resume at 300 mg tid and titrate to home dose.

## 2013-09-19 NOTE — Progress Notes (Signed)
Physical Therapy Session Note  Patient Details  Name: [name] MRN: [id number] Date of Birth: 1960-03-25  Today's Date: 09/19/2013 PT Individual Time: 1300-1400 PT Individual Time Calculation (min): 60 min   Short Term Goals: Week 1:    STG=LTGs due to length of stay.   Skilled Therapeutic Interventions/Progress Updates:    Therapeutic Exercise: PT instructs pt re: importance of daily prone time (at least 10 minutes) as his body tolerates it with a stomach full of air. PT instructs pt in prone knee flexion bilaterally and prone hip extension bilaterally: 3 x 10 reps each.   Gait Training: PT instructs pt in ascending/descending 4" step with one wall rail and one underarm crutch x 6 reps req CGA and verbal cues for technique.   W/C Management: PT demonstrates w/c propulsion x 150' req SBA for safety. PT exchanges pt's R "calf pad" for a larger and contoured one to provide better support for pt's residual limb.   Pt is eager to work hard and get stronger. Pt will require stair training daily, as well as prone time, and progressive strengthening/activity tolerance. Pt can begin mat mobility towards quadruped to prepare for floor transfers. Continue per PT POC.   Therapy Documentation Precautions:  Restrictions Weight Bearing Restrictions: Yes RLE Weight Bearing: Non weight bearing LLE Weight Bearing: Weight bearing as tolerated Pain: Pain Assessment Pain Assessment: 0-10 Pain Score: 5  Pain Type: Surgical pain Pain Location: Leg Pain Orientation: Right Pain Descriptors / Indicators: Aching Pain Onset: On-going Pain Intervention(s): Rest Multiple Pain Sites: No    See FIM for current functional status  Therapy/Group: Individual Therapy  Alan Drummer M 09/19/2013, 1:04 PM

## 2013-09-19 NOTE — Progress Notes (Signed)
Social Work Patient ID: [name], male   DOB: Apr 03, 1960, 53 y.o.   MRN: [id number]  CSW attempted to see pt to complete assessment, but pt's friends arrived just as CSW tried to visit.  CSW to attempt visit with pt again soon, as therapy schedule allows.
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
## 2013-09-20 ENCOUNTER — Inpatient Hospital Stay (HOSPITAL_COMMUNITY): Payer: Medicaid Other | Admitting: Physical Therapy

## 2013-09-20 ENCOUNTER — Inpatient Hospital Stay (HOSPITAL_COMMUNITY): Payer: Medicaid Other

## 2013-09-20 ENCOUNTER — Inpatient Hospital Stay (HOSPITAL_COMMUNITY): Payer: Medicaid Other | Admitting: *Deleted

## 2013-09-20 LAB — GLUCOSE, CAPILLARY
GLUCOSE-CAPILLARY: 356 mg/dL — AB (ref 70–99)
Glucose-Capillary: 260 mg/dL — ABNORMAL HIGH (ref 70–99)
Glucose-Capillary: 134 mg/dL — ABNORMAL HIGH (ref 70–99)
Glucose-Capillary: 145 mg/dL — ABNORMAL HIGH (ref 70–99)

## 2013-09-20 MED ORDER — INSULIN ASPART 100 UNIT/ML ~~LOC~~ SOLN
6.0000 [IU] | Freq: Three times a day (TID) | SUBCUTANEOUS | Status: DC
  Administered 2013-09-20 – 2013-09-25 (×15): 6 [IU] via SUBCUTANEOUS

## 2013-09-20 MED ORDER — INSULIN GLARGINE 100 UNIT/ML ~~LOC~~ SOLN
45.0000 [IU] | Freq: Every day | SUBCUTANEOUS | Status: DC
  Administered 2013-09-20 – 2013-09-21 (×2): 45 [IU] via SUBCUTANEOUS
  Filled 2013-09-20 (×4): qty 0.45

## 2013-09-20 MED ORDER — INSULIN GLARGINE 100 UNIT/ML ~~LOC~~ SOLN: 45.0000 [IU] | Freq: Every day | SUBCUTANEOUS | Status: DC

## 2013-09-20 NOTE — Progress Notes (Signed)
Occupational Therapy Session Note  Patient Details  Name: [name] MRN: [id number] Date of Birth: Feb 26, 1960  Today's Date: 09/20/2013 OT Individual Time: 1101-1205 OT Individual Time Calculation (min): 64 min    Short Term Goals: Week 1:  OT Short Term Goal 1 (Week 1): STG=LTG due to short ELOS  Skilled Therapeutic Interventions/Progress Updates: ADL-retraining at tub/shower level with focus on w/c management, lateral transfer to tub bench, compensatory strategies to include lateral leans during bathing/dressing, and performance of iADL at ADL kitchen.   Pt received in his w/c and after re-ed on goals/objectives agreed to bathe using standard tub and tub bench.   Pt performed lateral transfer to tub bench with steadying assist for safety and min verbal cues for use of DME (w/c armrests, flip-back arms).   Pt performed bath unassisted with good thoroughness and dressed while seated on tub bench for upper body and later in w/c for lower body using lateral leans and partial stand to pull up his pants.   Pt was then escorted to ADL kitchen and educated on kitchen safety and mods to improve access to most frequently used item.   Pt retrieved eggs from refrigerator and learned to stand at sink and step sideways (hops) while supported by BUE.   Pt reported improved awareness of potential to resume cooking both from w/c level and with countertops and/or RW.   Pt was then received by RN tech at RN station at end of his session for routine lab checks.     Therapy Documentation Precautions:  Precautions Precautions: Fall Restrictions Weight Bearing Restrictions: Yes RLE Weight Bearing: Non weight bearing LLE Weight Bearing: Weight bearing as tolerated  Pain: Pain Assessment Pain Assessment: 0-10 Pain Score: 3  Pain Type: Surgical pain Pain Location: Leg Pain Orientation: Right Pain Descriptors / Indicators: Aching Pain Onset: On-going Pain Intervention(s): Rest;Repositioned Multiple Pain  Sites: No  See FIM for current functional status  Therapy/Group: Individual Therapy  [place] 09/20/2013, 12:42 PM

## 2013-09-20 NOTE — Progress Notes (Signed)
[facility] Individual Statement of Services  Patient Name:  [name]  Date:  09/20/2013  Welcome to the [facility].  Our goal is to provide you with an individualized program based on your diagnosis and situation, designed to meet your specific needs.  With this comprehensive rehabilitation program, you will be expected to participate in at least 3 hours of rehabilitation therapies Monday-Friday, with modified therapy programming on the weekends.  Your rehabilitation program will include the following services:  Physical Therapy (PT), Occupational Therapy (OT), 24 hour per day rehabilitation nursing, Case Management (Social Worker), Rehabilitation Medicine, Nutrition Services and Pharmacy Services  Weekly team conferences will be held on Tuesdays to discuss your progress.  Your Social Worker will talk with you frequently to get your input and to update you on team discussions.  Team conferences with you and your family in attendance may also be held.  Expected length of stay:  7 to 10 days  Overall anticipated outcome:  Modified Independent  Depending on your progress and recovery, your program may change. Your Social Worker will coordinate services and will keep you informed of any changes. Your Social Worker's name and contact numbers are listed  below.  The following services may also be recommended but are not provided by the [facility] will be made to provide these services after discharge if needed.  Arrangements include referral to agencies that provide these services.  Your insurance has been verified to be:  Medicaid Your primary doctor is:  [facility]  Pertinent information will be shared with your doctor and your insurance company.  Social Worker:  Alfonse Alpers, LCSW  [phone]  or ([phone]  Information discussed with and copy given to patient by: Trey Sailors, 09/20/2013, 9:18 AM
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
## 2013-09-20 NOTE — Progress Notes (Signed)
Physical Therapy Session Note  Patient Details  Name: [name] MRN: [id number] Date of Birth: 1960/05/22  Today's Date: 09/20/2013 PT Individual Time: 1000-1100 Tx 2: 1300-1400 PT Individual Time Calculation (min): 60 min Tx 2: 60 min  Short Term Goals: Week 1:  PT Short Term Goal 1 (Week 1): STGs=LTGs due to estimated LOS.   Skilled Therapeutic Interventions/Progress Updates:    W/C Management: PT instructs pt in w/c propulsion with B UE x 200' req SBA and verbal cues for pt to doff legrests.   Gait Training: PT instructs pt in ambulation with RW x 90' req SBA for safety and verbal cues to make sure the walker has stopped rolling prior to hopping forward.  PT instructs pt in ascending/descending 3 5.5" stairs with L rail and R crutch on way up req min A and R rail and L crutch on way down x 2 sets.   Therapeutic Activity: PT instructs pt in pre-falls recovery training on the mat: pt rolls supine to prone mod I and prone to quadruped req CGA for safety. In quadruped, PT instructs pt in unilateral UE flexion x 10 reps each side for weight shift and strengthening; pt req repeated verbal cues to weight shift hips forward as he tends to sit back on hips. PT instructs pt in doing this again, but this time in quadruped to perform unilateral hip/knee extension x 10 reps each side.   Tx 2: Neuromuscular Reeducation: PT instructs pt in TUG and pt scores 45 seconds first time, 31 seconds second time, and 27 seconds third time, indicating high falls risk.  Therapeutic Exercise: PT instructs pt in prone time on mat while pt performs knee flexion and hip extension with knee flexed 3 x 10 reps B LEs.  PT instructs pt in strengthening/ROM exercises to B LEs: hamstring stretch 3 x 1 minute each, SLR 3 x 10 reps, side lie hip abduction 3 x 10 reps, bridges with large bolster under knees 3 x 10 reps each LE.    Pt is tolerating increased activity during sessions and making progress with stair training  and towards floor recovery. PT trialed Wylie Hail Reaction with pt during gait on level surface and stairs and pt reports he feels more stable with this AFO. Pt will benefit from continued PT to maximize safety with function. Continue per PT POC.   Therapy Documentation Precautions:  Precautions Precautions: Fall Restrictions Weight Bearing Restrictions: Yes RLE Weight Bearing: Non weight bearing LLE Weight Bearing: Weight bearing as tolerated Pain: Pain Assessment Pain Assessment: 0-10 Pain Score: 3  Pain Type: Surgical pain Pain Location: Leg Pain Orientation: Right Pain Descriptors / Indicators: Aching Pain Onset: On-going Pain Intervention(s): Rest;Repositioned Multiple Pain Sites: No Tx 2: Pt c/o 5/10 pain in R leg and PT utilized rest and repositioning to decrease pain.  Balance: Balance Balance Assessed: Yes Standardized Balance Assessment Standardized Balance Assessment: Timed Up and Go Test Timed Up and Go Test TUG: Normal TUG Normal TUG (seconds): 27  See FIM for current functional status  Therapy/Group: Individual Therapy  Terrionna Bridwell M 09/20/2013, 10:03 AM
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
## 2013-09-20 NOTE — Progress Notes (Signed)
Orthopedic Tech Progress Note Patient Details:  [name] 1960/02/04 [id number] Advanced called to place brace order Patient ID: [name], male   DOB: 01-31-1960, 53 y.o.   MRN: [id number]   Fenton Foy 09/20/2013, 10:04 AM

## 2013-09-21 ENCOUNTER — Inpatient Hospital Stay (HOSPITAL_COMMUNITY): Payer: Medicaid Other

## 2013-09-21 ENCOUNTER — Inpatient Hospital Stay (HOSPITAL_COMMUNITY): Payer: Medicaid Other | Admitting: Physical Therapy

## 2013-09-21 LAB — GLUCOSE, CAPILLARY
GLUCOSE-CAPILLARY: 200 mg/dL — AB (ref 70–99)
GLUCOSE-CAPILLARY: 205 mg/dL — AB (ref 70–99)
Glucose-Capillary: 119 mg/dL — ABNORMAL HIGH (ref 70–99)
Glucose-Capillary: 348 mg/dL — ABNORMAL HIGH (ref 70–99)

## 2013-09-21 NOTE — Progress Notes (Signed)
9/18 Inpatient Diabetes Program Recommendations  AACE/ADA: New Consensus Statement on Inpatient Glycemic Control (2013)  Target Ranges:  Prepandial:   less than 140 mg/dL      Peak postprandial:   less than 180 mg/dL (1-2 hours)      Critically ill patients:  140 - 180 mg/dL    Inpatient Diabetes Program Recommendations Insulin - Basal: May consider increase to 50 units, as fastings remain high (home dose lantus 70 units) Insulin - Meal Coverage: before lunch and before supper fairly well controlled. Would recommend an increase in meal coverage for only lunch and supper to 6 units.   Thank you, Rosita Kea, RN, CNS, Diabetes Coordinator [phone])

## 2013-09-21 NOTE — Plan of Care (Signed)
Problem: RH PAIN MANAGEMENT Goal: RH STG PAIN MANAGED AT OR BELOW PT'S PAIN GOAL <5  Outcome: Not Progressing Rates pain 6/10

## 2013-09-21 NOTE — Progress Notes (Signed)
Physical Therapy Session Note  Patient Details  Name: [name] MRN: [id number] Date of Birth: September 26, 1960  Today's Date: 09/21/2013 PT Individual Time: 0830-0930 Tx 2: 1500-1600 PT Individual Time Calculation (min): 60 min Tx 2: 60 min  Short Term Goals: Week 1:  PT Short Term Goal 1 (Week 1): STGs=LTGs due to estimated LOS.   Skilled Therapeutic Interventions/Progress Updates:    Gait Training: PT instructs pt in ambulation on level surface x 110' req SBA and verbal cues to make sure the walker has stopped rolling before hopping forward.  PT instructs pt in ascending/descending ramp with RW req min A and verbal cues for technique.   Orthotic Fit:  Pt received Walk On Reaction AFO from orthotist previous day and pt trimmed shoe insole to have it fit in shoe without wrinkling under L foot/residual limb. PT then obtained a new sock from clean linen for pt to use to stuff in place of toes in shoes as old sock appeared very dirty. Orthotist has imprinted pt's foot for a Toe Filler and pt is awaiting fabrication and delivery of toe filler. Pt reports balled up sock in shoe is comfortable and not too tight. Pt demonstrates ability to don AFO and shoe from a w/c level req SBA and verbal cues to ensure laces are tight so that back of foot does not piston up and down in shoe.   Therapeutic Exercise: PT instructs pt in  Mat level ROM and strengthening exercises to B LEs: side lie hip abduction, bridges with bolster under B knees: 3 x 10 reps, hamstring stretch 3 x 1 minute each: B LEs.   Therapeutic Activity: PT instructs pt in sit to stand with RW and armrests of w/c req SBA and verbal cues/reminder to lock brakes. PT instructs pt in w/c to mat transfer step-hop with RW and armrests req SBA and verbal cues for safe hand placement when sitting on mat. Pt demonstrates mod I mat mobility supine to/from sit.   Tx 2:  Therapeutic Activity: Pt completes stand-pivot transfer on/off toilet with grab bar  req remote supervision. Pt manages pants and hygiene. Pt completes change of shorts at w/c level mod I.  PT instructs pt in mat mobility supine to prone to quadruped req SBA and instructs pt in crawling forward/back on mat x 5 reps and R/L on mat x 5 reps req SBA and verbal cues.  PT instructs pt in modified short kneel to tall kneel with B UE support on padded bench x 10 reps req SBA.  PT instructs pt in squat-pivot transfer w/c to/from mat req SBA and verbal cues for technique x 2 reps total.   Gait Training: PT instructs pt in ascending/descending 6 stairs with L rail and R crutch on way up and R rail and L crutch on way down req CGA and verbal cues for safety.   Therapeutic Exercise: PT instructs pt in prone time and exercises for B LE/UE while in prone: knee flexion, hip extension, and press ups 3 x 10 reps each.   Pt is progressing with functional mobility on level surfaces, but continues to have difficulty with ambulation on uneven/stair surfaces. Pt reports generalized muscle soreness after yesterday's session, but is willing to work hard on continued strengthening to improve endurance/activity tolerance. Continue per PT POC.   Therapy Documentation Precautions:  Precautions Precautions: Fall Restrictions Weight Bearing Restrictions: Yes RLE Weight Bearing: Non weight bearing LLE Weight Bearing: Weight bearing as tolerated Pain: Pain Assessment Pain Assessment: 0-10  Pain Score: 3  Pain Type: Other (Comment) (generalized ) Pain Location: Generalized Pain Descriptors / Indicators: Aching;Sore Pain Onset: Gradual Pain Intervention(s): Rest Multiple Pain Sites: No Tx 2: Pt c/o 5/10 generalized soreness and 5/10 pain in R residual limb; PT utilizes rest and repositioning to decrease pain.   See FIM for current functional status  Therapy/Group: Individual Therapy  Freddye Cardamone M 09/21/2013, 8:33 AM

## 2013-09-21 NOTE — IPOC Note (Signed)
Overall Plan of Care [facility]) Patient Details Name: [name] MRN: [id number] DOB: December 25, 1960  Admitting Diagnosis: r bka  Hospital Problems: Active Problems:   Unilateral complete BKA     Functional Problem List: Nursing Pain;Skin Integrity  PT Balance;Skin Integrity;Edema;Endurance;Motor;Pain;Safety;Sensory  OT Balance;Edema;Endurance;Pain;Safety;Skin Integrity  SLP    TR Endurance;Motor;Safety;Sensory;Skin Integrity       Basic ADL's: OT Grooming;Bathing;Dressing;Toileting     Advanced  ADL's: OT Simple Meal Preparation;Light Housekeeping     Transfers: PT Bed Mobility;Bed to Chair;Car;Furniture;Floor  OT Toilet;Tub/Shower     Locomotion: PT Ambulation;Wheelchair Mobility;Stairs     Additional Impairments: OT    SLP        TR      Anticipated Outcomes Item Anticipated Outcome  Self Feeding    Swallowing      Basic self-care  Mod I (if lateral lean in shower), when sponge bathing-can stand at sink with Mod I  Toileting  Mod I   Bathroom Transfers Mod I toilet with 3 in 1 over toilet and supervision for shower with tub bench  Bowel/Bladder  Mod I  Transfers  mod I  Locomotion  mod I on level surface; supervision on stairs  Communication     Cognition     Pain  <3  Safety/Judgment  Mod I   Therapy Plan: PT Intensity: Minimum of 1-2 x/day ,45 to 90 minutes PT Frequency: 5 out of 7 days PT Duration Estimated Length of Stay: 7-10 days OT Intensity: Minimum of 1-2 x/day, 45 to 90 minutes OT Frequency: 5 out of 7 days OT Duration/Estimated Length of Stay: 7-10 days         Team Interventions: Nursing Interventions Disease Management/Prevention;Pain Management;Skin Care/Wound Management  PT interventions Ambulation/gait training;Discharge planning;Functional mobility training;Psychosocial support;Therapeutic Activities;Balance/vestibular training;Disease management/prevention;Neuromuscular re-education;Skin care/wound management;Therapeutic  Exercise;Wheelchair propulsion/positioning;DME/adaptive equipment instruction;Pain management;Splinting/orthotics;UE/LE Strength taining/ROM;Community reintegration;Patient/family education;Stair training;UE/LE Coordination activities  OT Interventions Balance/vestibular training;Community reintegration;Discharge planning;Disease mangement/prevention;DME/adaptive equipment instruction;Functional mobility training;Pain management;Patient/family education;Psychosocial support;Skin care/wound managment;Self Care/advanced ADL retraining;Therapeutic Activities;Therapeutic Exercise;UE/LE Strength taining/ROM;Wheelchair propulsion/positioning  SLP Interventions    TR Interventions Adaptive equipment instruction;1:1 session;Balance/vestibular training;Functional mobility training;Community reintegration;Patient/family education;Therapeutic activities;Recreation/leisure participation;Therapeutic exercise;UE/LE Coordination activities;Wheelchair propulsion/positioning  SW/CM Interventions Discharge Planning;Psychosocial Support;Patient/Family Education    Team Discharge Planning: Destination: PT-Home ,OT- Home , SLP-  Projected Follow-up: PT-Outpatient PT;Other (comment) (intermittent supervision/assist), OT-  Home health OT (patient did not have good experience with HH in the past), SLP-  Projected Equipment Needs: PT-Wheelchair cushion (measurements);To be determined;Other (comment) (Toe Off AFO and Toe Filler for L LE; R prosthetic limb to be addressed in outpatient), OT- None recommended by OT;Tub/shower seat, SLP-  Equipment Details: PT-Pt has w/c, RW, underarm crutches, BSC, and transfer-tub bench already. , OT-3 in 1 and tub bench were ordered on acute and were in patient's room the morning of eval Patient/family involved in discharge planning: PT- Patient,  OT-Patient, SLP-   MD ELOS: 7-10 days Medical Rehab Prognosis:  Excellent Assessment: The patient has been admitted for CIR therapies with the  diagnosis of right bka. The team will be addressing functional mobility, strength, stamina, balance, safety, adaptive techniques and equipment, self-care, bowel and bladder mgt, patient and caregiver education, pain mgt, skin care, pre-pros ed, fall prevention, egosupport, community reintegration. Goals have been set at mod I for mobility and self-care. Pt is quite motivated.    Meredith Staggers, MD, FAAPMR      See Team Conference Notes for weekly updates to the plan of care

## 2013-09-21 NOTE — Progress Notes (Signed)
Occupational Therapy Session Note  Patient Details  Name: Tyrell Starck MRN: [id number] Date of Birth: 1960-06-04  Today's Date: 09/21/2013 OT Individual Time: 0930-1030 OT Individual Time Calculation (min): 60 min    Short Term Goals: Week 1:  OT Short Term Goal 1 (Week 1): STG=LTG due to short ELOS  Skilled Therapeutic Interventions/Progress Updates: ADL- retraining with emphasis on improved functional mobility using RW during BADL.   Pt returned to tub bench and completed transfer using RW this session.   With setup assist to provide shower barrier, pt completed all bathing and dressing, sitting and standing, using tub bench while bathing and w/c for dressing with RW for support to pull-up his pants.   Pt fastened pants while seated using good safety awareness without need for prompts or cues.    Therapy Documentation Precautions:  Precautions Precautions: Fall Restrictions Weight Bearing Restrictions: Yes RLE Weight Bearing: Non weight bearing LLE Weight Bearing: Weight bearing as tolerated  Pain: No/denies   See FIM for current functional status  Therapy/Group: Individual Therapy  [place] 09/21/2013, 12:44 PM
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
## 2013-09-22 ENCOUNTER — Inpatient Hospital Stay (HOSPITAL_COMMUNITY): Payer: Medicaid Other | Admitting: Occupational Therapy

## 2013-09-22 ENCOUNTER — Inpatient Hospital Stay (HOSPITAL_COMMUNITY): Payer: Medicaid Other | Admitting: Physical Therapy

## 2013-09-22 DIAGNOSIS — N179 Acute kidney failure, unspecified: Secondary | ICD-10-CM

## 2013-09-22 DIAGNOSIS — S88119A Complete traumatic amputation at level between knee and ankle, unspecified lower leg, initial encounter: Secondary | ICD-10-CM

## 2013-09-22 LAB — GLUCOSE, CAPILLARY
GLUCOSE-CAPILLARY: 233 mg/dL — AB (ref 70–99)
Glucose-Capillary: 245 mg/dL — ABNORMAL HIGH (ref 70–99)
Glucose-Capillary: 171 mg/dL — ABNORMAL HIGH (ref 70–99)
Glucose-Capillary: 147 mg/dL — ABNORMAL HIGH (ref 70–99)

## 2013-09-22 MED ORDER — INSULIN GLARGINE 100 UNIT/ML ~~LOC~~ SOLN
50.0000 [IU] | Freq: Every day | SUBCUTANEOUS | Status: DC
  Administered 2013-09-22 – 2013-09-24 (×3): 50 [IU] via SUBCUTANEOUS
  Filled 2013-09-22 (×3): qty 0.5

## 2013-09-22 MED ORDER — SALINE SPRAY 0.65 % NA SOLN
1.0000 | NASAL | Status: DC | PRN
  Administered 2013-09-22: 1 via NASAL
  Filled 2013-09-22: qty 44

## 2013-09-22 NOTE — Progress Notes (Signed)
Physical Therapy Session Note  Patient Details  Name: [name] MRN: [id number] Date of Birth: [birth year]  Today's Date: 09/22/2013 PT Individual Time: 0800-0900 Tx 2: 1415-1500 PT Individual Time Calculation (min): 60 min Tx 2: 45 min  Short Term Goals: Week 1:  PT Short Term Goal 1 (Week 1): STGs=LTGs due to estimated LOS.   Skilled Therapeutic Interventions/Progress Updates:    W/C Management: PT takes pt outside to assess w/c negotiation in the community. Pt demonstrates mod I w/c propulsion with B UEs on level surface x 400', mod I propulsion up and down outdoor ramp, and Supervision req cues for technique for pt to open a heavy door and propel his w/c through it.   Gait Training: PT instructs pt in ambulation with RW x 110' with B UE support req SBA for safety. PT examined pt's heel in L shoe with [place] Walk On Reaction and notes that it does NOT piston up and down, which was a concern of the patient's.   Therapeutic Exercise: PT instructs pt in mat level B LE strengthening and ROM exercises: SLR, side lie hip abduction, bridges with bolster under B knees: 3 x 10 reps each, hamstring stretch 3 x 1 minute each LE.   Neuromuscular Reeducation: PT instructs pt in TUG and pt scores 29 seconds first attempt, 28 seconds second attempt, and 27 seconds third attempt, indicating high falls risk.   Tx 2: Gait Training: PT instructs pt in ascending 9 steps with L rail and R crutch and descending 9 steps with R rail and L crutch req CGA-min A, pending height of rail. PT used practice stairs in small gym set at 6.5" height.   Therapeutic Activity: PT instructs pt in prefalls recovery on mat: tall kneel to half kneel x 10 reps with B UE support on a bench.   Therapeutic Exercise: PT instructs pt in standing mini-squats 3 x 10 reps with RW to simulate the muscles that will need to be active during falls recovery.   Pt agrees to have his daughter measure the seat of his w/c at home, so  that therapist can order pt a [place] w/c cushion (he does not have a cushion). Pt became tearful during morning session, mourning the loss of his limb and realizing how close he was to dying after listening to voicemails from his sister.  Pt is progressing well with functional mobility - up to 9 steps at a time, but home has 13 steps. Cont per PT POC.   Therapy Documentation Precautions:  Precautions Precautions: Fall Restrictions Weight Bearing Restrictions: Yes RLE Weight Bearing: Non weight bearing LLE Weight Bearing: Weight bearing as tolerated Pain: Pain Assessment Pain Assessment: 0-10 Pain Score: 2  Pain Type: Acute pain Pain Location: Generalized Pain Orientation: Right Pain Descriptors / Indicators: Sore Pain Onset: On-going Pain Intervention(s): Rest Multiple Pain Sites: No Tx 2: Pt c/o 2/10 generalized soreness all over; PT utilizes rest to decrease pain.  Balance: Balance Balance Assessed: Yes Standardized Balance Assessment Standardized Balance Assessment: Timed Up and Go Test Timed Up and Go Test TUG: Normal TUG Normal TUG (seconds): 27  See FIM for current functional status  Therapy/Group: Individual Therapy  Caridad Silveira M 09/22/2013, 8:02 AM
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
## 2013-09-22 NOTE — Progress Notes (Signed)
Occupational Therapy Session Note  Patient Details  Name: [name] MRN: [id number] Date of Birth: Dec 12, 1960  Today's Date: 09/22/2013 OT Individual Time: 1015-1100 OT Individual Time Calculation (min): 45 min    Short Term Goals: Week 1:  OT Short Term Goal 1 (Week 1): STG=LTG due to short ELOS  Skilled Therapeutic Interventions/Progress Updates:   Patient seen for ADL retraining and instruction this morning for shower on transfer tub bench with tub/shower combination for discharge planning for home.  R residual limb bagged to prevent moisture to area; reinforced with paper tape.  R residual limb found clean/dry/inact after removal of bag.  Dressing/ACE wrap on R residual limb.  Patient gathers clothing wheelchair level prior to session and is ready upon therapist arrival.  Transfers wheelchair to/from transfer tub bench with supervision.  Doffs clothing with supervision/SBA; able to stand to unfasten pants and partially doff with sitting to remove from LLE.  Patient bathes seated with supervision; leans right/left to wash buttocks and peri area.  Able to set water temperature independently.  Patient completes dressing wheelchair level.  Independent upper body dressing and completes LB dressing with modified independence with safety concerns.  [place] independently.  Patient motivated and participates well in therapy session.  Patient making excellent progress toward goals.  Educated regarding energy conservation techniques and patient receptive.   Therapy Documentation Precautions:  Precautions Precautions: Fall Restrictions Weight Bearing Restrictions: Yes RLE Weight Bearing: Non weight bearing LLE Weight Bearing: Weight bearing as tolerated Pain: Pain Assessment Pain Score: 2   See FIM for current functional status  Therapy/Group: Individual Therapy  Osa Craver 09/22/2013, 12:34 PM

## 2013-09-22 NOTE — Progress Notes (Signed)
Occupational Therapy Session Note  Patient Details  Name: Eulus Quintero MRN: [id number] Date of Birth: [birth year]  Today's Date: 09/22/2013 OT Individual Time: 1330-1400 OT Individual Time Calculation (min): 30 min    Short Term Goals: Week 1:  OT Short Term Goal 1 (Week 1): STG=LTG due to short ELOS  Skilled Therapeutic Interventions/Progress Updates:   Patient seen this afternoon with focus on functional mobility, therapeutic activity tolerance, strengthening, standing balance and standing tolerance.  Patient sidelying in bed asleep upon therapist arrival.  Patient easily awakened and transfers to EOB with modified independence.  Patient dons L shoe/AFO with modified independence and uses urinal with modified independence.  Patient transfers EOB to wheelchair via squat pivot modified independence.  Patient self propels wheelchair from hospital room to therapy gym with modified independence using BUEs for improved activity tolerance.  Patient transfers sit to/from stand x 10 minutes while completing dynamic standing balance 1 handed overhead reaching task.  Patient self propels wheelchair from therapy gym to hospital room with modified independence with increased time secondary to fatigue for continued activity tolerance.  Patient requires min verbal cues and education regarding positioning of residual limb in extended position to prevent decreased ROM in R knee.  Patient with good understanding and is able to self correct after verbal cues.  Therapy Documentation Precautions:  Precautions Precautions: Fall Restrictions Weight Bearing Restrictions: Yes RLE Weight Bearing: Non weight bearing LLE Weight Bearing: Weight bearing as tolerated Pain: Pain Assessment Pain Assessment: 0-10 Pain Score: 2  Pain Type: Acute pain Pain Location: Generalized Pain Descriptors / Indicators: Sore Pain Onset: On-going Pain Intervention(s): Rest Multiple Pain Sites: No  See FIM for current functional  status  Therapy/Group: Individual Therapy  Osa Craver 09/22/2013, 3:07 PM

## 2013-09-23 ENCOUNTER — Inpatient Hospital Stay (HOSPITAL_COMMUNITY): Payer: Medicaid Other | Admitting: *Deleted

## 2013-09-23 LAB — GLUCOSE, CAPILLARY
GLUCOSE-CAPILLARY: 229 mg/dL — AB (ref 70–99)
Glucose-Capillary: 235 mg/dL — ABNORMAL HIGH (ref 70–99)
Glucose-Capillary: 233 mg/dL — ABNORMAL HIGH (ref 70–99)
Glucose-Capillary: 267 mg/dL — ABNORMAL HIGH (ref 70–99)

## 2013-09-23 NOTE — Progress Notes (Addendum)
Occupational Therapy Session Note  Patient Details  Name: [name] MRN: [id number] Date of Birth: 1960-07-16  Today's Date: 09/23/2013 OT Individual Time:  -   1300-1330   (30 min)      Short Term Goals: Week 1:  OT Short Term Goal 1 (Week 1): STG=LTG due to short ELOS  Skilled Therapeutic Interventions/Progress Updates:    Addressed functional mobility, strengthening, standing balance and tolerance.  Pt. Transferred from EOB to wc with SBA.  Donned Left shoe/AFO with set up .  Propelled wc to therapy gym with Mod I on level surface. >  Pt. transferref from sit to stand with SBA.  Stood for 12 minutes while engage in reaching activities of checkers.   pt supported  Using one UE while standing at HI Lo table.  Pt. Propelled self back to room with no assistance.    Therapy Documentation Precautions:  Precautions Precautions: Fall Restrictions Weight Bearing Restrictions: Yes RLE Weight Bearing: Non weight bearing LLE Weight Bearing: Weight bearing as tolerated      Pain: Pain Assessment Pain Assessment: No/denies pain Pain Score: 2/10 RLE         See FIM for current functional status  Therapy/Group: Individual Therapy  Lisa Roca 09/23/2013, 6:17 PM

## 2013-09-23 NOTE — Progress Notes (Signed)
Physical Therapy Session Note  Patient Details  Name: [name] MRN: [id number] Date of Birth: [birth year]  Today's Date: 09/23/2013 PT Individual Time: 1400-1430 PT Individual Time Calculation (min): 30 min   Short Term Goals: Week 1:  PT Short Term Goal 1 (Week 1): STGs=LTGs due to estimated LOS.   Skilled Therapeutic Interventions/Progress Updates:    Patient received sitting in wheelchair. Session focused on wheelchair mobility/ramp negotiation, stair negotiation, and stretching. Wheelchair mobility >150' with mod I; ramp negotiation x2 (ascends/descends forwards with B UE and close supervision, then ascends/descends backwards with B UE and L LE with supervision). Discussion/education about ascending ramps backwards and improved energy conservation.  Stair negotiation: Ascends 9 steps forwards with L rail and R crutch and descends 9 steps forwards with R rail and L crutch, patient requires close supervision, one instance of min A when ascending second step secondary to poor L foot placement resulting in posterior LOB. Step height set at 6.5".  Prone on elbows x8' with emphasis on scapular retractor activation and hip flexor stretching. Patient returned to room and left sitting in wheelchair.  Therapy Documentation Precautions:  Precautions Precautions: Fall Restrictions Weight Bearing Restrictions: Yes RLE Weight Bearing: Non weight bearing LLE Weight Bearing: Weight bearing as tolerated Pain: Pain Assessment Pain Assessment: No/denies pain Pain Score: 0-No pain Locomotion : Ambulation Ambulation/Gait Assistance: Not tested (comment) Wheelchair Mobility Distance: 150   See FIM for current functional status  Therapy/Group: Individual Therapy  Lillia Abed. Shanae Luo, PT, DPT 09/23/2013, 3:00 PM

## 2013-09-23 NOTE — Progress Notes (Signed)
Pain managed with 1 oxy IR, given at HS. Requested and given PRN trazodone 50mg  for sleep. Reminders to call for assistance. Requesting ice cream, juice, etc., reminded patient about trying to follow his carb mod. Diet. Reports, he managed his insulin and blood sugars at home PTA. [name]
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
## 2013-09-23 NOTE — Progress Notes (Deleted)
This AM complaining of Abdominal cramping after having BM. Reports feels like when he had gastroparesis. Abd. Nontender. [name] A

## 2013-09-24 ENCOUNTER — Inpatient Hospital Stay (HOSPITAL_COMMUNITY): Payer: Medicaid Other

## 2013-09-24 LAB — GLUCOSE, CAPILLARY
GLUCOSE-CAPILLARY: 220 mg/dL — AB (ref 70–99)
Glucose-Capillary: 318 mg/dL — ABNORMAL HIGH (ref 70–99)
Glucose-Capillary: 142 mg/dL — ABNORMAL HIGH (ref 70–99)
Glucose-Capillary: 210 mg/dL — ABNORMAL HIGH (ref 70–99)

## 2013-09-24 MED ORDER — INSULIN GLARGINE 100 UNIT/ML ~~LOC~~ SOLN
55.0000 [IU] | Freq: Every day | SUBCUTANEOUS | Status: DC
  Administered 2013-09-25: 55 [IU] via SUBCUTANEOUS
  Filled 2013-09-24: qty 0.55

## 2013-09-24 NOTE — Progress Notes (Signed)
PRN Trazodone 50mg  given at 2200--  +/- sleep, thus far. [name] A

## 2013-09-24 NOTE — Progress Notes (Signed)
Orthopedic Tech Progress Note Patient Details:  [name] 02-11-1960 [id number] [facility] contacted to place brace order Patient ID: [name], male   DOB: 1960/10/31, 53 y.o.   MRN: [id number]   Fenton Foy 09/24/2013, 10:55 AM

## 2013-09-24 NOTE — Progress Notes (Signed)
Physical Therapy Session Note  Patient Details  Name: [name] MRN: [id number] Date of Birth: 09/29/1960  Today's Date: 09/24/2013 PT Individual Time: 0800-0900 PT Individual Time Calculation (min): 60 min   Short Term Goals: Week 1:  PT Short Term Goal 1 (Week 1): STGs=LTGs due to estimated LOS.   Skilled Therapeutic Interventions/Progress Updates:    Pt received seated EOB, agreeable to participate in therapy. Pt donned L shoe w/ [place] WalkOn Reaction AFO and toe filler inside shoe w/ MinA. Pt performed squat pivot transfer bed>w/c w/ modified (I), pt able to set up w/c for transfer independently. Propelled w/c 100' to rehab gym w/ mod (I). Ambulated 65' to ADL apartment w/ close S w/ RW, performed sit<>stand from sofa in ADL apartment w/ supervision. Ambulated 40' to rehab gym, then 20' to stairwell w/ RW and close S. Pt ascended 11 stairs w/ L rail and axillary crutch on R w/ MinGuard A, took seated rest break at top of stairs. Descended 10 stairs, then ascended 10 stairs w/ MinGuard overall w/ 1 posterior LOB requiring ModA to correct. Seated rest break at top of stairs, then descended 11 stairs w/ R rail and axillary crutch on L w/ MinGuard A. Propelled w/c 100' to rehab gym, performed 10' on UE Ergometer 3' on L6 and 7' on L7. Pt propelled w/c back to room w/ mod (I), left seated in w/c w/ all needs within reach.   Therapy Documentation Precautions:  Precautions Precautions: Fall Restrictions Weight Bearing Restrictions: Yes RLE Weight Bearing: Non weight bearing LLE Weight Bearing: Weight bearing as tolerated General:   Vital Signs: Therapy Vitals Temp: 98.1 F (36.7 C) Temp src: Oral Pulse Rate: 91 Resp: 18 BP: 146/62 mmHg Patient Position (if appropriate): Lying Oxygen Therapy SpO2: 99 % O2 Device: None (Room air) Pain: Pain Assessment Pain Assessment: 0-10 Pain Score: 2  Pain Type: Neuropathic pain Pain Location: Leg Pain Orientation: Right Pain Descriptors  / Indicators: Sharp;Shooting Pain Frequency: Intermittent Pain Onset: Awakened from sleep Pain Intervention(s): Medication (See eMAR) Multiple Pain Sites: No Mobility:   Locomotion :    Trunk/Postural Assessment :    Balance:   Exercises:   Other Treatments:    See FIM for current functional status  Therapy/Group: Individual Therapy  Rada Hay Rada Hay, PT, DPT  09/24/2013, 8:48 AM

## 2013-09-24 NOTE — Progress Notes (Signed)
Occupational Therapy Session Note  Patient Details  Name: Cramer Mcnerney MRN: [id number] Date of Birth: 04-26-1960  Today's Date: 09/24/2013 OT Individual Time: 0930-1030 OT Individual Time Calculation (min): 60 min    Short Term Goals: Week 1:  OT Short Term Goal 1 (Week 1): STG=LTG due to short ELOS  Skilled Therapeutic Interventions/Progress Updates: ADL-retraining with focus on discharge planning, problem-solving, orthotics management (LLE), dynamic standing balance and endurance.   Pt completed w/c mobility to bathe using tub bench but deferred use of RW this session d/t mild discomfort at left ankle.  Pt completed transfer to bench independently and bathed seated using lateral leans for buttocks with only setup assist to gather supplies. After bathing pt and OT evaluated pt's shoe and orthotic for LLE and determined that new insert was forcing his foot too far forward and also increased pressure at dorsum of foot.   Shoe was readjusted to increase space and pt was provided TED to replace sock.  Pt returned to his room in w/c and completed grooming seated.   OT provided assist to replace shrinker for right LE residual limb.     Therapy Documentation Precautions:  Precautions Precautions: Fall Restrictions Weight Bearing Restrictions: Yes RLE Weight Bearing: Non weight bearing LLE Weight Bearing: Weight bearing as tolerated  Pain: Pain Assessment Pain Assessment: 0-10 Pain Score: 2  Pain Type: Neuropathic pain Pain Location: Leg Pain Orientation: Right Pain Descriptors / Indicators: Sharp;Shooting Pain Frequency: Intermittent Pain Onset: Gradual Patients Stated Pain Goal: 3 Pain Intervention(s): Repositioned;Emotional support (Recently medicated) Multiple Pain Sites: No  See FIM for current functional status  Therapy/Group: Individual Therapy  Bethenny Losee 09/24/2013, 10:42 AM
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
## 2013-09-24 NOTE — Progress Notes (Signed)
Physical Therapy Session Note  Patient Details  Name: [name] MRN: [id number] Date of Birth: 1960-11-30  Today's Date: 09/24/2013 PT Individual Time: 1405-1505 PT Individual Time Calculation (min): 60 min   Short Term Goals: Week 1:  PT Short Term Goal 1 (Week 1): STGs=LTGs due to estimated LOS.   Skilled Therapeutic Interventions/Progress Updates:  Patient sitting in wheelchair upon entering room. PT acquired shoe horn so patient could don left shoe and orthosis. Session focused on increasing endurance with ambulation and ROM/strengthening of LE's. Patient ambulated with RW 115 feet and  125 feet x 2 during session. Patient performed LE exercises for 2 sets of 15 reps including: sitting knee extension, hip flexion; sidelying hip abduction, hip flexion/extension; and prone knee flexion. Patient laid prone x 10 minutes total to stretch hip flexors. Patient propelled wheelchair 150+ feet throughout session. Patient left in wheelchair in room with all items in reach.  Therapy Documentation Precautions:  Precautions Precautions: Fall Restrictions Weight Bearing Restrictions: Yes RLE Weight Bearing: Non weight bearing LLE Weight Bearing: Weight bearing as tolerated  Pain: Pain Assessment Pain Assessment: 0-10 Pain Score: 2  Pain Type: Neuropathic pain Pain Location: Leg  Locomotion : Ambulation Ambulation/Gait Assistance: 5: Supervision Wheelchair Mobility Distance: 150   See FIM for current functional status  Therapy/Group: Individual Therapy  Elder Love M 09/24/2013, 4:11 PM

## 2013-09-25 ENCOUNTER — Inpatient Hospital Stay (HOSPITAL_COMMUNITY): Payer: Medicaid Other

## 2013-09-25 LAB — GLUCOSE, CAPILLARY
GLUCOSE-CAPILLARY: 174 mg/dL — AB (ref 70–99)
Glucose-Capillary: 210 mg/dL — ABNORMAL HIGH (ref 70–99)
Glucose-Capillary: 152 mg/dL — ABNORMAL HIGH (ref 70–99)

## 2013-09-25 MED ORDER — INSULIN GLARGINE 100 UNIT/ML ~~LOC~~ SOLN: 55.0000 [IU] | Freq: Every day | SUBCUTANEOUS | Status: DC

## 2013-09-25 MED ORDER — INSULIN ASPART 100 UNIT/ML ~~LOC~~ SOLN
0.0000 [IU] | Freq: Three times a day (TID) | SUBCUTANEOUS | Status: DC
  Administered 2013-09-25 (×2): 4 [IU] via SUBCUTANEOUS
  Administered 2013-09-26: 3 [IU] via SUBCUTANEOUS
  Administered 2013-09-26 (×2): 4 [IU] via SUBCUTANEOUS
  Administered 2013-09-27: 3 [IU] via SUBCUTANEOUS
  Administered 2013-09-27: 7 [IU] via SUBCUTANEOUS

## 2013-09-25 MED ORDER — INSULIN ASPART 100 UNIT/ML ~~LOC~~ SOLN
10.0000 [IU] | Freq: Three times a day (TID) | SUBCUTANEOUS | Status: DC
  Administered 2013-09-25 – 2013-09-26 (×3): 10 [IU] via SUBCUTANEOUS

## 2013-09-25 MED ORDER — INSULIN ASPART 100 UNIT/ML ~~LOC~~ SOLN: 0.0000 [IU] | Freq: Every day | SUBCUTANEOUS | Status: DC

## 2013-09-25 NOTE — Progress Notes (Signed)
Occupational Therapy Session Note  Patient Details  Name: [name] MRN: [id number] Date of Birth: [birth year]  Today's Date: 09/25/2013 OT Individual Time: 0930-1040 OT Individual Time Calculation (min): 70 min    Short Term Goals: Week 1:  OT Short Term Goal 1 (Week 1): STG=LTG due to short ELOS  Skilled Therapeutic Interventions/Progress Updates: Therapeutic activity with focus on problem-solving, discharge planning, performance of iADL using LRAD, standing balance and home mods.   Pt prepared simple meal ([employer], qty 4) using RW, countertop, swivel stool, and wheeled food cart to safely move ingredients for meal from table to counter to stove.   Pt demo'd good dynamic standing balance while reaching in/out of refrigerator to retrieve items.  With instructional cues to simplify processes, prepare food seated versus standing at counter,  and use stool to conserve energy, pt performed all tasks essential for meal prep and supervised assistance when needed (d/t time limitations) successfully w/o LOB although requiring use of stool and chair for brief rest breaks.   Pt verbalized improved awareness of his endurance limitations during task.     Therapy Documentation Precautions:  Precautions Precautions: Fall Restrictions Weight Bearing Restrictions: Yes RLE Weight Bearing: Non weight bearing LLE Weight Bearing: Weight bearing as tolerated  Pain: No/dienies   See FIM for current functional status  Therapy/Group: Individual Therapy  [name] 09/25/2013, 12:41 PM

## 2013-09-25 NOTE — Progress Notes (Signed)
Physical Therapy Session Note  Patient Details  Name: [name] MRN: [id number] Date of Birth: [birth year]  Today's Date: 09/25/2013 PT Individual Time: 0800-0900 PT Individual Time Calculation (min): 60 min  Session 2 Time: 1600-1700 Time Calculation (min): 60 min   Short Term Goals: Week 1:  PT Short Term Goal 1 (Week 1): STGs=LTGs due to estimated LOS.   Skilled Therapeutic Interventions/Progress Updates:    Session 1: Pt received seated in w/c, agreeable to participate in therapy. Pt reported increased fatigue this morning, pt suspects it is likely due to medication. Pt propelled w/c 100' to rehab gym. Performed 2x10 step ups onto 6 inch step w/ RW w/ emphasis on soft landing to emphasize unweighting LLE. Ambulated 120' to ortho gym w/ RW and close S. Pt reporting increased fatigue in forearm flexors during gait, educated pt on use of elbow extension and scapular depression during gait to unweight LLE to increase gait efficiency. Pt w/ car transfer into simulated SUV height w/ supervision, min VC's for hand placement on back of car seat to guide hips in. Pt negotiated up 5 stairs w/ L rail and axillary crutch on R, 1 LOB requiring ModA, pt reporting arms very fatigued and he could not make it up remainder of stairwell. Pt turned and descended 5 stairs w/ ModA. Propelled w/c 84' to rehab gym, SPT onto Nustep w/ RW and supervision. Pt performed 2x5' on L5 w/ UE only w/ long rest break between sets. SPT w/ supervision w/ RW back to w/c, propelled w/c w/ mod (I) back to room. Pt left seated in w/c w/ all needs within reach.  Session 2: Pt received supine in bed, agreeable to participate in therapy after using urinal. Pt stood to use urinal with distant supervision, then donned shoe w/ AFO and setup transfer w/ mod (I), performed squat pivot transfer to w/c w/ mod (I). Session focused on community ambulation, community w/c management, stair negotiation. Pt propelled w/c 100' to elevator bank, then  managed w/c on/off of elevator w/ mod (I), propelled w/c 250' around hospital atrium and sidewalks outside. Pt ambulated w/ RW and close S for 75' on sidewalks and uneven surfaces. Pt educated on use of RW vs w/c after discharge, use of w/c in community, energy conservation techniques. Pt propelled w/c back to elevators and to rehab unit w/ mod (I). Pt ascended 11 stairs w/ L rail and axillary crutch on R w/ overall MinGuard A w/ one near LOB posterior requiring ModA to correct. After seated rest break, pt descended 11 stairs w/ R rail and axillary crutch on L w/ MinA. Pt propelled w/c back to room, left seated in chair w/ all needs within reach.   Therapy Documentation Precautions:  Precautions Precautions: Fall Restrictions Weight Bearing Restrictions: Yes RLE Weight Bearing: Non weight bearing LLE Weight Bearing: Weight bearing as tolerated General:   Vital Signs: Therapy Vitals Temp: 98.8 F (37.1 C) Temp src: Oral Pulse Rate: 80 Resp: 18 BP: 156/80 mmHg Patient Position (if appropriate): Lying Oxygen Therapy SpO2: 100 % O2 Device: None (Room air) Pain:   Mobility:   Locomotion :    Trunk/Postural Assessment :    Balance:   Exercises:   Other Treatments:    See FIM for current functional status  Therapy/Group: Individual Therapy  Rada Hay Rada Hay, PT, DPT 09/25/2013, 7:37 AM

## 2013-09-25 NOTE — Progress Notes (Signed)
Patient transferred himself independently from wheelchair to recliner without notifying staff.  Patient re-educated about fall precautions and asking staff for assistance.  Patient verbalizes understanding.  Will continue to monitor.

## 2013-09-25 NOTE — Progress Notes (Signed)
Orthopedic Tech Progress Note Patient Details:  [name] 1960-09-12 [id number] Called order for stump shrinker in to [facility].     [name] 09/25/2013, 2:59 PM

## 2013-09-25 NOTE — Progress Notes (Signed)
Recreational Therapy Session Note  Patient Details  Name: Javaun Deeter MRN: [id number] Date of Birth: 06-Mar-1960 Today's Date: 09/25/2013  Pain: no c/o Skilled Therapeutic Interventions/Progress Updates: Session focused on meal prep & kitchen safety.  Pt organized a plan, delegated tasks, & performed meal prep activity making [place] at supervision/set up level seated & supervision standing/ambulating with RW.  Pt demonstrated good safety awareness throughout activity & did not require verbal cues.   Therapy/Group: Co-Treatment Deshana Rominger 09/25/2013, 11:25 AM

## 2013-09-25 NOTE — Progress Notes (Signed)
Patient expressing concerns about his uncontrolled BS. He wants to get back on home regimen of lantus at bedtime as this helped with highs in am. Home regiment was Lantus 75 units at hs with SSI/ meal coverage of 11-27 units tid. Will change lantus to HS and increase meal coverage to 10 units tid.
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
## 2013-09-26 ENCOUNTER — Inpatient Hospital Stay (HOSPITAL_COMMUNITY): Payer: Medicaid Other | Admitting: Occupational Therapy

## 2013-09-26 ENCOUNTER — Inpatient Hospital Stay (HOSPITAL_COMMUNITY): Payer: Medicaid Other | Admitting: Physical Therapy

## 2013-09-26 LAB — GLUCOSE, CAPILLARY
GLUCOSE-CAPILLARY: 174 mg/dL — AB (ref 70–99)
Glucose-Capillary: 253 mg/dL — ABNORMAL HIGH (ref 70–99)
Glucose-Capillary: 183 mg/dL — ABNORMAL HIGH (ref 70–99)
Glucose-Capillary: 143 mg/dL — ABNORMAL HIGH (ref 70–99)
Glucose-Capillary: 172 mg/dL — ABNORMAL HIGH (ref 70–99)

## 2013-09-26 MED ORDER — INSULIN ASPART 100 UNIT/ML ~~LOC~~ SOLN
15.0000 [IU] | Freq: Three times a day (TID) | SUBCUTANEOUS | Status: DC
  Administered 2013-09-26 – 2013-09-28 (×6): 15 [IU] via SUBCUTANEOUS

## 2013-09-26 MED ORDER — INSULIN GLARGINE 100 UNIT/ML ~~LOC~~ SOLN
70.0000 [IU] | Freq: Every day | SUBCUTANEOUS | Status: DC
  Administered 2013-09-26 – 2013-09-27 (×2): 70 [IU] via SUBCUTANEOUS
  Filled 2013-09-26 (×3): qty 0.7

## 2013-09-26 NOTE — Progress Notes (Signed)
Occupational Therapy Session Note  Patient Details  Name: [name] MRN: [id number] Date of Birth: [birth year]  Today's Date: 09/26/2013 OT Individual Time: 1105-1205 OT Individual Time Calculation (min): 60 min   Short Term Goals: Week 1:  OT Short Term Goal 1 (Week 1): STG=LTG due to short ELOS  Skilled Therapeutic Interventions/Progress Updates:  Patient resting in w/c upon arrival and speaking with SW about discharge planning.  Engaged in self care retraining to include shower, dress and groom.  Focused session on functional mobility, walker safety, standing tolerance and dynamic standing balance.  Patient gathered clothes via w/c prior to OT arrival, propelled w/c to therapy bathroom with tub/shower combo, ambulated ~5 feet with RW to sit on tub bench, removed shoe with orthotic and sock then spun around on tub bench to perform lateral leans to doff shorts.  Patient completed shower seated and lateral leans and after shower, completed LB dressing sitting at the edge of tub bench.  Patient ambulated with RW  back to his hospital room and stood at sink to perform grooming tasks.  Encouraged patient to gather clothes using RW tomorrow for graduation day.  Therapy Documentation Precautions:  Precautions Precautions: Fall Restrictions Weight Bearing Restrictions: Yes RLE Weight Bearing: Non weight bearing LLE Weight Bearing: Weight bearing as tolerated Pain: No report of pain ADL: See FIM for current functional status  Therapy/Group: Individual Therapy  Abdur Hoglund 09/26/2013, 10:40 AM
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
## 2013-09-26 NOTE — Progress Notes (Addendum)
Physical Therapy Session Note  Patient Details  Name: [name] MRN: [id number] Date of Birth: 1960/02/21  Today's Date: 09/26/2013 PT Individual Time: 0830-0930 Tx 2: 1430-1530 PT Individual Time Calculation (min): 60 min Tx 2: 60 min  Short Term Goals: Week 1:  PT Short Term Goal 1 (Week 1): STGs=LTGs due to estimated LOS.   Skilled Therapeutic Interventions/Progress Updates:    Gait Training: PT instructs pt in ambulation with RW, L [place] Walk On Reaction AFO, and toe filler x 150' req SBA - 1 LOB self corrected.   Therapeutic Exercise: PT gave pt written handouts with pictures of home program to continue strengthening and ROM practice once d/c.  PT instructspt in prone knee flexion R and L, prone hip extension R side only (L side too sore/weak to do today), side lie hip abduction R and L, bridges: 3 x 10 reps each.  PT instructs pt in supine hamstring stretch with towel on L side and hands under knees on R side, L calf stretch with towel 3 x 1 minute each.   Tx 2: Gait Training: PT instructs pt in ascending/descending 13 stairs with L rail/R crutch (up), R rail/L crutch (down) req SBA-CGA with 2 LOB.  PT instructs pt in ambulation with B underarm crutches x 150' req CGA for safety.  PT instructs pt in obstacle course ambulating with underarm crutches, weaving around obstacles, hopping over obstacles, and ascending steps of different heights req CGA and verbal cues for safety.   Therapeutic Activity: PT instructs pt in floor recovery with L AFO and mat at self reported height of couch at home req SBA, progressing to mod I x 3 reps.   Neuromuscular Reeducation: PT instructs pt in TUG with B underarm crutches scoring: 46 seconds, 34 seconds, and 34 seconds. The majority of the time was dependent on configuring crutches after first stand.   Pt reports being very sore from the previous week of therapy, but willingly participates in session as he is able. Pt explains that his plan  at home is to keep the RW upstairs and to use underarm crutches and w/c downstairs, and 1 crutch and 1 rail on the stairs. PT gave pt handouts for HEP during morning session and pt demonstrates understanding in conducting home program. Pt will benefit from continued high level balance training and functional mobility training to maximize safety with functional independence.    Therapy Documentation Precautions:  Precautions Precautions: Fall Restrictions Weight Bearing Restrictions: Yes RLE Weight Bearing: Non weight bearing LLE Weight Bearing: Weight bearing as tolerated Pain: Pain Assessment Pain Assessment: 0-10 Pain Score: 5  Pain Type: Other (Comment) Pain Location: Generalized Pain Orientation: Other (Comment) (all over) Pain Descriptors / Indicators: Sore Pain Onset: Gradual Pain Intervention(s): Rest Multiple Pain Sites: No Tx 2: Pt c/o 5/10 generalized soreness throughout body and PT utilized rest to reduce pain.  Balance: Balance Balance Assessed: Yes Standardized Balance Assessment Standardized Balance Assessment: Timed Up and Go Test Timed Up and Go Test Normal TUG (seconds): 34 (crutches)  See FIM for current functional status  Therapy/Group: Individual Therapy  Khrystyne Arpin M 09/26/2013, 8:34 AM

## 2013-09-27 ENCOUNTER — Inpatient Hospital Stay (HOSPITAL_COMMUNITY): Payer: Medicaid Other

## 2013-09-27 ENCOUNTER — Inpatient Hospital Stay (HOSPITAL_COMMUNITY): Payer: Medicaid Other | Admitting: Physical Therapy

## 2013-09-27 LAB — GLUCOSE, CAPILLARY
GLUCOSE-CAPILLARY: 238 mg/dL — AB (ref 70–99)
Glucose-Capillary: 130 mg/dL — ABNORMAL HIGH (ref 70–99)
Glucose-Capillary: 90 mg/dL (ref 70–99)
Glucose-Capillary: 108 mg/dL — ABNORMAL HIGH (ref 70–99)

## 2013-09-27 MED ORDER — AMLODIPINE BESYLATE 10 MG PO TABS: 10.0000 mg | ORAL_TABLET | Freq: Every day | ORAL | Status: DC

## 2013-09-27 MED ORDER — TRAZODONE HCL 50 MG PO TABS: 50.0000 mg | ORAL_TABLET | Freq: Every evening | ORAL | Status: DC | PRN

## 2013-09-27 MED ORDER — OXYCODONE HCL 5 MG PO TABS: 5.0000 mg | ORAL_TABLET | Freq: Four times a day (QID) | ORAL | Status: DC | PRN

## 2013-09-27 MED ORDER — GABAPENTIN 300 MG PO CAPS: 300.0000 mg | ORAL_CAPSULE | Freq: Three times a day (TID) | ORAL | Status: DC

## 2013-09-27 MED ORDER — POLYETHYLENE GLYCOL 3350 17 G PO PACK: 17.0000 g | PACK | Freq: Two times a day (BID) | ORAL | Status: DC

## 2013-09-27 MED ORDER — HYDROCERIN EX CREA: 1.0000 "application " | TOPICAL_CREAM | Freq: Two times a day (BID) | CUTANEOUS | Status: DC

## 2013-09-27 NOTE — Progress Notes (Signed)
Social Work Discharge Note  The overall goal for the admission was met for:   Discharge location: Yes - home  Length of Stay: Yes - 10 days  Discharge activity level: Yes - modified independent  Home/community participation: Yes  Services provided included: MD, RD, PT, OT, RN, Pharmacy and SW  Financial Services: Medicaid  Follow-up services arranged: DME: right amputee support pad-this will be delivered to pt's home by [employer] on day of d/c to attach to his w/c that is at home already  Comments (or additional information):  Pt has decided to save his 10 PT visits until he is closer to receiving his prosthesis.  CSW included information in his d/c instructions for him to contact Jamey Reas at that time.  Pt will have help as needed from his children and friends.  Patient/Family verbalized understanding of follow-up arrangements: Yes  Individual responsible for coordination of the follow-up plan: pt  Confirmed correct DME delivered: Trey Sailors 09/27/2013    Shaliyah Taite, Silvestre Mesi

## 2013-09-27 NOTE — Progress Notes (Signed)
Trazodone 50mg  given at 2304 per patient's request. Rested without complaint of during the night. [name]

## 2013-09-27 NOTE — Progress Notes (Signed)
Physical Therapy Session Note  Patient Details  Name: [name] MRN: [id number] Date of Birth: 03-13-1960  Today's Date: 09/27/2013 PT Individual Time: 1105-1205 PT Individual Time Calculation (min): 60 min   Short Term Goals: Week 1:  PT Short Term Goal 1 (Week 1): STGs=LTGs due to estimated LOS.   Skilled Therapeutic Interventions/Progress Updates:    Pt wears L [place] Walk On Reaction AFO and Toe Filler throughout session.  Gait Training: PT instructs pt in stair training x 13 steps with L rail and R crutch (up) and R rail and L crutch (down) req SBA for safety.  Pt demonstrates ambulation x 200' with B underarm crutches mod I.   Therapeutic Activity: Pt demonstrates mod I squat-pivot transfers w/c to/from bed and mod I supine to/from sit transfer in bed.  Pt demonstrates mod I floor recovery from mat on floor to mat table in gym.  Pt demonstrates mod I transfer w/c to/from love seat with crutches.   Community Reintegration: Pt demonstrates mod I w/c propulsion with B UE in the community including on/off elevator, level surface, ramp surface, and opening and going through a heavy standard door (~400').   Pt has progressed well during inpatient rehab stay and is safe to d/c home tomorrow.    Therapy Documentation Precautions:  Precautions Precautions: Fall Restrictions Weight Bearing Restrictions: Yes RLE Weight Bearing: Non weight bearing LLE Weight Bearing: Weight bearing as tolerated Pain: Pain Assessment Pain Assessment: 0-10 Pain Score: 4  Pain Type: Acute pain Pain Location: Generalized Pain Descriptors / Indicators: Sore Pain Onset: On-going Pain Intervention(s): Rest Multiple Pain Sites: No 2nd Pain Site Pain Score: 1 Pain Type: Surgical pain Pain Location: Leg Pain Orientation: Right Pain Descriptors / Indicators: Aching Pain Onset: On-going Pain Intervention(s): Rest  See FIM for current functional status  Therapy/Group: Individual  Therapy  Jyl Chico M 09/27/2013, 11:06 AM
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
## 2013-09-27 NOTE — Progress Notes (Signed)
Recreational Therapy Session Note  Patient Details  Name: [name] MRN: [id number] Date of Birth: 1960/09/07 Today's Date: 09/27/2013  Pain: no c/o Skilled Therapeutic Interventions/Progress Updates: Session focused on discharge planning & use of leisure time post discharge.  Pt able to identify safe & appropriate activities for participation post discharge.  Pt stating energy conservation techniques & safety concerns with tasks.  Pt is Mod I w/c level.  Therapy/Group: Individual Therapy  Dayami Taitt 09/27/2013, 1:05 PM

## 2013-09-27 NOTE — Discharge Instructions (Signed)
Inpatient Rehab Discharge Instructions  [name] Discharge date and time:  09/28/13  Activities/Precautions/ Functional Status: Activity: activity as tolerated Diet: diabetic diet Wound Care: Wash with soap and water. Pat dry. Apply stocking daily.   Functional status:  ___ No restrictions     ___ Walk up steps independently ___ 24/7 supervision/assistance   ___ Walk up steps with assistance ___ Intermittent supervision/assistance  ___ Bathe/dress independently ___ Walk with walker     ___ Bathe/dress with assistance ___ Walk Independently    ___ Shower independently ___ Walk with assistance    ___ Shower with assistance _X__ No alcohol     ___ Return to work/school ________  COMMUNITY REFERRALS UPON DISCHARGE:   Medical Equipment/Items Ordered:  Right amputee support pad for wheelchair you already have  Agency/Supplier:  [employer]       Phone:  [facility] once you are closer to receiving your prosthesis [address] [place], [place] PATIENT/FAMILY: Support Groups:  [facility]                                [employer], president                                       [phone]  Special Instructions:    My questions have been answered and I understand these instructions. I will adhere to these goals and the provided educational materials after my discharge from the hospital.  Patient/Caregiver Signature _______________________________ Date __________  Clinician Signature _______________________________________ Date __________  Please bring this form and your medication list with you to all your follow-up doctor's appointments.

## 2013-09-27 NOTE — Progress Notes (Signed)
Recreational Therapy Discharge Summary Patient Details  Name: Edrees Valent MRN: [id number] Date of Birth: [birth year] Today's Date: 09/27/2013  Long term goals set: 1  Long term goals met: 1  Comments on progress toward goals: Pt has made great progress toward goal and is ready for discharge home at Mod I level.  Pt educated on energy conservation techniques, use of leisure time post discharge & overall safety.  Pt is anxious to return to activities of choice.  Reasons for discharge: discharge from hospital Patient/family agrees with progress made and goals achieved: Yes  Patrice Moates 09/27/2013, 3:22 PM

## 2013-09-27 NOTE — Progress Notes (Signed)
Social Work Patient ID: [name], male   DOB: Apr 17, 1960, 53 y.o.   MRN: [id number]  CSW met with pt on 09-26-13 to update him on team conference discussion.  Pt is pleased to be going home on 09-28-13 and will have his dtr to come to get him.  Pt will need a right amputee support pad for his existing w/c at home.  CSW ordered this from [facility].  Pt would like to save his physical therapy sessions until closer to when he gets his prosthesis, as he only gets 10 therapy visits under his medicaid.  CSW updated PA and therapists of this decision.  CSW included the information for [facility] in his d/c instructions for when he is ready for PT.  Pt has no other concerns/needs/questions at this time.  CSW is available as needed.

## 2013-09-27 NOTE — Progress Notes (Signed)
Physical Therapy Discharge Summary  Patient Details  Name: [name] MRN: [id number] Date of Birth: Dec 31, 1960  Today's Date: 09/27/2013 PT Individual Time: 3354-5625 PT Individual Time Calculation (min): 60 min    Patient has met 10 of 10 long term goals due to improved activity tolerance, improved balance, improved postural control, increased strength, increased range of motion, decreased pain, ability to compensate for deficits and improved coordination.  Patient to discharge at an ambulatory level Modified Independent.   Patient's care partner is independent to provide the necessary physical assistance at discharge.   Recommendation:  Patient will benefit from ongoing skilled PT services in outpatient setting to continue to advance safe functional mobility, address ongoing impairments in R LE once he obtains a prosthesis, and minimize fall risk.  Equipment: R amputee support and w/c cushion  Reasons for discharge: treatment goals met  Patient/family agrees with progress made and goals achieved: Yes Treatment/Interventions: Therapeutic Exercises: Pt demonstrates ability to do HEP mod I with handout: hamstring stretch x 1 minute, heel cord stretch x 1 minute, side lie hip abduction x 10 reps, bridges x 10 reps, prone knee flexion x 10, prone hip extension x 10.   Gait Training: PT demonstrates mod I ambulation with RW x 200' - slowed pace.    Therapeutic Activity: Pt demonstrates mod I car transfer with RW and with underarm crutches (x 2 reps total).   Pt has met all PT goals and is safe to d/c home tomorrow. Pt will follow up with outpatient PT once he obtains a prosthetic limb.    PT Discharge Precautions/Restrictions Precautions Precautions: Fall Restrictions Weight Bearing Restrictions: Yes RLE Weight Bearing: Non weight bearing LLE Weight Bearing: Weight bearing as tolerated Pain Pain Assessment Pain Assessment: 0-10 Pain Score: 4  Pain Type: Acute pain Pain  Location: Generalized Pain Descriptors / Indicators: Sore Pain Onset: On-going Pain Intervention(s): Rest Multiple Pain Sites: Yes 2nd Pain Site Pain Score: 3 Pain Type: Surgical pain Pain Location: Leg Pain Orientation: Right Pain Descriptors / Indicators: Sore Pain Onset: On-going Pain Intervention(s): Rest Vision/Perception   WFL  Cognition Overall Cognitive Status: Within Functional Limits for tasks assessed Arousal/Alertness: Awake/alert Orientation Level: Oriented X4 Attention: Focused;Sustained Focused Attention: Appears intact Sustained Attention: Appears intact Memory: Appears intact Awareness: Appears intact Problem Solving: Appears intact Safety/Judgment: Appears intact Sensation Sensation Light Touch: Impaired Detail Light Touch Impaired Details: Impaired LLE Stereognosis: Not tested Hot/Cold: Not tested Proprioception: Appears Intact Coordination Gross Motor Movements are Fluid and Coordinated: Yes Fine Motor Movements are Fluid and Coordinated: Not tested Motor  Motor Motor: Within Functional Limits Motor - Discharge Observations: pt compensates well with only 1 intact LE (with AFO and toe filler)  Mobility Bed Mobility Bed Mobility: Rolling Right;Rolling Left;Right Sidelying to Sit;Sit to Supine Rolling Right: 6: Modified independent (Device/Increase time) Rolling Left: 6: Modified independent (Device/Increase time) Right Sidelying to Sit: 6: Modified independent (Device/Increase time) Supine to Sit: 6: Modified independent (Device/Increase time) Sit to Supine: 6: Modified independent (Device/Increase time) Transfers Transfers: Yes Sit to Stand: 6: Modified independent (Device/Increase time) Stand Pivot Transfers: 6: Modified independent (Device/Increase time) Squat Pivot Transfers: 6: Modified independent (Device/Increase time) Locomotion  Ambulation Ambulation: Yes Ambulation/Gait Assistance: 6: Modified independent (Device/Increase  time) Ambulation Distance (Feet): 200 Feet Assistive device: Rolling walker;Crutches Gait Gait: Yes Gait Pattern: Impaired Gait Pattern: Trunk flexed (step-hop pattern) Gait velocity: decreased Stairs / Additional Locomotion Stairs: Yes Stairs Assistance: 5: Supervision Stairs Assistance Details: Verbal cues for technique Stair Management Technique: One rail  Left;Other (comment) (one crutch R side (up)) Number of Stairs: 13 Wheelchair Mobility Wheelchair Mobility: Yes Wheelchair Assistance: 6: Modified independent (Device/Increase time) Environmental health practitioner: Both upper extremities Wheelchair Parts Management: Independent Distance: 400  Trunk/Postural Assessment  Cervical Assessment Cervical Assessment: Within Functional Limits Thoracic Assessment Thoracic Assessment: Within Functional Limits Lumbar Assessment Lumbar Assessment: Within Functional Limits Postural Control Postural Control: Within Functional Limits Protective Responses: pt compensates well after rehab  Balance Balance Balance Assessed: Yes Static Sitting Balance Static Sitting - Balance Support: No upper extremity supported;Feet supported Static Sitting - Level of Assistance: 7: Independent Dynamic Sitting Balance Dynamic Sitting - Balance Support: Bilateral upper extremity supported;Feet supported;During functional activity Dynamic Sitting - Level of Assistance: 6: Modified independent (Device/Increase time) Static Standing Balance Static Standing - Balance Support: Bilateral upper extremity supported;During functional activity Static Standing - Level of Assistance: 6: Modified independent (Device/Increase time) Dynamic Standing Balance Dynamic Standing - Balance Support: Bilateral upper extremity supported;During functional activity Dynamic Standing - Level of Assistance: 6: Modified independent (Device/Increase time) Dynamic Standing - Balance Activities: Reaching across midline;Forward lean/weight  shifting;Lateral lean/weight shifting Extremity Assessment  RUE Assessment RUE Assessment: Within Functional Limits LUE Assessment LUE Assessment: Within Functional Limits RLE Assessment RLE Assessment: Exceptions to [facility] RLE AROM (degrees) Overall AROM Right Lower Extremity: Deficits;Due to precautions RLE Overall AROM Comments: below the knee amputation RLE Strength RLE Overall Strength: Within Functional Limits for tasks assessed RLE Overall Strength Comments: grossly 5/5 LLE Assessment LLE Assessment: Exceptions to [facility] LLE AROM (degrees) Overall AROM Left Lower Extremity: Deficits LLE Overall AROM Comments: transmetatarsal amputation: ankle movement limited but WFL, quadriceps tight but wfl, hamstrings tight but wfl LLE Strength LLE Overall Strength: Within Functional Limits for tasks assessed LLE Overall Strength Comments: grossly 5/5  See FIM for current functional status  Tyrese Ficek M 09/27/2013, 3:06 PM
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
## 2013-09-27 NOTE — Progress Notes (Signed)
Occupational Therapy Session and Discharge Summary  Patient Details  Name: [name] MRN: [id number] Date of Birth: 01/28/1960  Today's Date: 09/28/2013 OT Individual Time: 0900-1000 OT Individual Time Calculation (min): 60 min   Skilled Intervention: ADL-retraining with emphasis on discharge planning with re-ed on enhancement of functional mobility using HEP for BUE strengthening.   Pt was challenged to perform all self-care to include bathing/dressing/grooming,  transfers in/out of bed, on/off toilet, in/out of tub using tub bench, using RW, long shoe horn and all materials necessary.   Pt demonstrated ability to complete all tasks using appropriate DME and AE without supervision and setup although expedited with min setup d/t time limitations.   Pt will likely require extra time to complete BADL d/t limited mobility but he is aware of his limitations and remains focused on safety.    OT provided written HEP with demo of recommended exercises using theraband at end of session. Pt retains understanding of principles of therapeutic exercise from [facility].  Patient has met 11 of 11 long term goals due to improved activity tolerance, improved balance, postural control, ability to compensate for deficits and improved awareness.  Patient to discharge at overall Modified Independent level.      Reasons goals not met: n/a  Recommendation:  Patient will benefit from ongoing skilled OT services in home health setting to continue to advance functional skills in the area of iADL.  Equipment: No equipment provided (received BSC and tub bench previously)  Reasons for discharge: treatment goals met  Patient/family agrees with progress made and goals achieved: Yes  OT Discharge Precautions/Restrictions  Precautions Precautions: Fall Restrictions Weight Bearing Restrictions: Yes RLE Weight Bearing: Non weight bearing LLE Weight Bearing: Weight  bearing as tolerated Other Position/Activity Restrictions: wearing AFO Vital Signs Therapy Vitals Temp: 98.6 F (37 C) Temp src: Oral Pulse Rate: 88 Resp: 18 BP: 160/81 mmHg Patient Position (if appropriate): Lying Oxygen Therapy SpO2: 100 % O2 Device: None (Room air) Pain Pain Assessment Pain Assessment: No/denies pain ADL ADL ADL Comments: see FIM Vision/Perception  Vision- History Baseline Vision/History: No visual deficits Patient Visual Report: No change from baseline Vision- Assessment Vision Assessment?: No apparent visual deficits Perception Comments: WFL  Cognition Overall Cognitive Status: Within Functional Limits for tasks assessed Arousal/Alertness: Awake/alert Orientation Level: Oriented X4 Attention: Divided Divided Attention: Appears intact Memory: Appears intact Awareness: Appears intact Problem Solving: Appears intact Safety/Judgment: Appears intact Sensation Sensation Light Touch: Impaired Detail Light Touch Impaired Details: Impaired LLE Stereognosis: Appears Intact Hot/Cold: Appears Intact Proprioception: Appears Intact Additional Comments: WFL @ BUE Coordination Gross Motor Movements are Fluid and Coordinated: Yes Fine Motor Movements are Fluid and Coordinated: Yes Motor  Motor Motor: Within Functional Limits Motor - Discharge Observations: pt compensates well with only 1 intact LE (with AFO and toe filler) Mobility  Bed Mobility Bed Mobility: Rolling Right;Rolling Left;Right Sidelying to Sit;Sit to Supine Rolling Right: 6: Modified independent (Device/Increase time) Rolling Left: 6: Modified independent (Device/Increase time) Right Sidelying to Sit: 6: Modified independent (Device/Increase time) Supine to Sit: 6: Modified independent (Device/Increase time) Sit to Supine: 6: Modified independent (Device/Increase time) Transfers Transfers: Sit to Stand;Stand to Sit Sit to Stand: 6: Modified independent (Device/Increase time) Stand to  Sit: 6: Modified independent (Device/Increase time)  Trunk/Postural Assessment  Cervical Assessment Cervical Assessment: Within Functional Limits Thoracic Assessment Thoracic Assessment: Within Functional Limits Lumbar Assessment Lumbar Assessment: Within Functional Limits Postural Control Postural Control: Within Functional Limits Protective Responses: pt  compensates well after rehab  Balance Balance Balance Assessed: Yes Static Sitting Balance Static Sitting - Balance Support: No upper extremity supported;Feet supported Static Sitting - Level of Assistance: 7: Independent Dynamic Sitting Balance Dynamic Sitting - Balance Support: Bilateral upper extremity supported;Feet supported;During functional activity Dynamic Sitting - Level of Assistance: 6: Modified independent (Device/Increase time) Dynamic Sitting - Balance Activities: Forward lean/weight shifting;Reaching across midline;Reaching for objects Static Standing Balance Static Standing - Balance Support: Bilateral upper extremity supported;During functional activity Static Standing - Level of Assistance: 6: Modified independent (Device/Increase time) Dynamic Standing Balance Dynamic Standing - Balance Support: Bilateral upper extremity supported;During functional activity Dynamic Standing - Level of Assistance: 6: Modified independent (Device/Increase time) Dynamic Standing - Balance Activities: Reaching across midline;Forward lean/weight shifting;Lateral lean/weight shifting Extremity/Trunk Assessment RUE Assessment RUE Assessment: Within Functional Limits LUE Assessment LUE Assessment: Within Functional Limits  See FIM for current functional status  [name] 09/28/2013, 6:46 AM

## 2013-09-28 DIAGNOSIS — S88119A Complete traumatic amputation at level between knee and ankle, unspecified lower leg, initial encounter: Secondary | ICD-10-CM

## 2013-09-28 DIAGNOSIS — N179 Acute kidney failure, unspecified: Secondary | ICD-10-CM

## 2013-09-28 LAB — GLUCOSE, CAPILLARY: GLUCOSE-CAPILLARY: 109 mg/dL — AB (ref 70–99)

## 2013-09-28 MED ORDER — OXYCODONE HCL 5 MG PO TABS: 5.0000 mg | ORAL_TABLET | Freq: Four times a day (QID) | ORAL | Status: DC | PRN

## 2013-09-28 MED ORDER — CARVEDILOL 3.125 MG PO TABS: 3.1250 mg | ORAL_TABLET | Freq: Two times a day (BID) | ORAL | Status: DC

## 2013-09-28 MED ORDER — AMLODIPINE BESYLATE 10 MG PO TABS: 10.0000 mg | ORAL_TABLET | Freq: Every day | ORAL | Status: DC

## 2013-09-28 MED ORDER — INSULIN GLARGINE 100 UNIT/ML ~~LOC~~ SOLN: 70.0000 [IU] | Freq: Every day | SUBCUTANEOUS | Status: DC

## 2013-09-28 MED ORDER — GABAPENTIN 300 MG PO CAPS: 300.0000 mg | ORAL_CAPSULE | Freq: Three times a day (TID) | ORAL | Status: DC

## 2013-09-28 MED ORDER — ROSUVASTATIN CALCIUM 5 MG PO TABS: 5.0000 mg | ORAL_TABLET | Freq: Every day | ORAL | Status: DC

## 2013-09-28 MED ORDER — ESOMEPRAZOLE MAGNESIUM 20 MG PO CPDR: 20.0000 mg | DELAYED_RELEASE_CAPSULE | Freq: Every day | ORAL | Status: DC
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
## 2013-09-28 NOTE — Discharge Summary (Signed)
Physician Discharge Summary  Patient ID: [name] MRN: [id number] DOB/AGE: 07-22-1960 53 y.o.  Admit date: 09/18/2013 Discharge date: 09/28/2013  Discharge Diagnoses:  Principal Problem:   Unilateral complete BKA Active Problems:   Diabetes mellitus type 2, uncontrolled, with complications   Essential hypertension   Hyponatremia   Discharged Condition: Stable.    Labs:  Basic Metabolic Panel:    Component Value Date/Time   NA 132* 09/19/2013 0715   K 4.7 09/19/2013 0715   CL 98 09/19/2013 0715   CO2 24 09/19/2013 0715   GLUCOSE 241* 09/19/2013 0715   BUN 18 09/19/2013 0715   CREATININE 1.09 09/19/2013 0715   CREATININE 0.85 05/30/2013 1100   CALCIUM 8.7 09/19/2013 0715   GFRNONAA 76* 09/19/2013 0715   GFRNONAA 70 04/11/2013 0825   GFRAA 88* 09/19/2013 0715   GFRAA 81 04/11/2013 0825      CBC: CBC Latest Ref Rng 09/19/2013 09/17/2013 09/16/2013  WBC 4.0 - 10.5 K/uL 10.1 9.6 9.8  Hemoglobin 13.0 - 17.0 g/dL 9.7(L) 9.3(L) 7.4(L)  Hematocrit 39.0 - 52.0 % 30.1(L) 28.6(L) 23.5(L)  Platelets 150 - 400 K/uL 423(H) 396 396     CBG: No results found for this basename: GLUCAP,  in the last 168 hours  Brief HPI:   [name] is a 53 y.o. 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 He was admitted on 09/07/13 for R-BKA by Dr. Sharol Given due to progressive dehiscence and necrosis of right foot due to gangrenous changes. Post op with pain requiring high doses of narcotics as well as constipation with abdominal distension. On 09/09, he developed ileus with lethargy and mental status changes due to acute renal failure due to ATN from hypotension. He was transferred to step down unit and treated with aggressive volume resuscitation with empiric antibiotics NGT as well as bowel rest. Foley placed and renal u/s without hydronephrosis. Ileus resolving and NGT discontinued and diet has been advanced  to soft. Mentation has improved but patient now deconditioned and CIR recommended for progressive therapies.    Hospital Course: Stuart Callais was admitted to rehab 09/18/2013 for inpatient therapies to consist of PT and OT at least three hours five days a week. Past admission physiatrist, therapy team and rehab RN have worked together to provide customized collaborative inpatient rehab. He was started on bowel program to help with constipation as ileus had resolved. He has been using narcotics judiciously and had tapered off this by discharge. He was educated on desensitization techniques and gabapentin was resumed with improvement in pain management. Po intake has been good and blood sugars have been reasonably controlled. He is to continue to monitor his BS ac/hs and use SSI per his home protocol. Right BKA incision has been healing well without signs or symptoms of infection. He was fitted with stump shrinker and was educated on appropriate application method. Follow up admission labs reveal that ABLA is stable and he continues to have mild hyponatremia. Acute renal failure has resolved and he was instructed to discontinue lasix and metformin till follow up with primary MD. Mood has been stable and he has shown good progress during his rehab stay. He is modified independent at discharge and has been educated on HEP. He plans to save his outpatient rehab benefits for post prosthetic training. He was discharged to home on 09/28/13   Rehab course: During patient's stay in rehab weekly team conferences  were held to monitor patient's progress, set goals and discuss barriers to discharge.Patient has had improvement in activity tolerance, balance, range of motion, coordination, postural control, as well as ability to compensate for deficits. He is able to complete ADL tasks using DME and AE independently. He is independent for transfers and is able to ambulate 200 feet with RW or crutches. He is able to navigate 5  stairs with supervision. No family education is needed as patient can direct his care and his friends and family will assist as needed past discharge.    Disposition: 01-Home or Self Care  Diet: Diabetic diet.   Special Instructions: 1. Clean wound with soap and water. Pat dry and cover with compression sock.     Medication List    STOP taking these medications       amoxicillin-clavulanate 875-125 MG per tablet  Commonly known as:  AUGMENTIN     furosemide 20 MG tablet  Commonly known as:  LASIX     metFORMIN 1000 MG tablet  Commonly known as:  GLUCOPHAGE     nitroGLYCERIN 0.2 mg/hr patch  Commonly known as:  NITRODUR - Dosed in mg/24 hr     oxyCODONE-acetaminophen 5-325 MG per tablet  Commonly known as:  PERCOCET/ROXICET      TAKE these medications       amLODipine 10 MG tablet  Commonly known as:  NORVASC  Take 1 tablet (10 mg total) by mouth daily.     aspirin 325 MG EC tablet  Take 325 mg by mouth daily.     carvedilol 3.125 MG tablet  Commonly known as:  COREG  Take 1 tablet (3.125 mg total) by mouth 2 (two) times daily with a meal.     esomeprazole 20 MG capsule  Commonly known as:  NEXIUM  Take 1 capsule (20 mg total) by mouth daily.     gabapentin 300 MG capsule  Commonly known as:  NEURONTIN  Take 1 capsule (300 mg total) by mouth 3 (three) times daily.     hydrocerin Crea  Apply 1 application topically 2 (two) times daily.     insulin aspart 100 UNIT/ML injection  Commonly known as:  novoLOG  Inject 11-27 Units into the skin 3 (three) times daily before meals.     insulin glargine 100 UNIT/ML injection  Commonly known as:  LANTUS  Inject 0.7 mLs (70 Units total) into the skin at bedtime.     oxyCODONE 5 MG immediate release tablet--Rx #30 pills   Commonly known as:  Oxy IR/ROXICODONE  Take 1 tablet (5 mg total) by mouth every 6 (six) hours as needed for severe pain.     polyethylene glycol packet  Commonly known as:  MIRALAX / GLYCOLAX   Take 17 g by mouth 2 (two) times daily.     rosuvastatin 5 MG tablet  Commonly known as:  CRESTOR  Take 1 tablet (5 mg total) by mouth at bedtime.     traMADol 50 MG tablet  Commonly known as:  ULTRAM  Take 1 tablet (50 mg total) by mouth 2 (two) times daily as needed (pain).     traZODone 50 MG tablet  Commonly known as:  DESYREL  Take 1 tablet (50 mg total) by mouth at bedtime as needed for sleep.       Follow-up Information   Follow up with Meredith Staggers, MD On 10/24/2013. (Be there at 11:20 am  for  11:40 appointment  appointment )    Specialty:  Physical Medicine and Rehabilitation   Contact information:   [address] [place] [address] [phone]       Follow up with [name], MD On 10/08/2013. (Be there at 4 pm for 4:15 pm appointment )    Specialty:  Orthopedic Surgery   Contact information:   [place] [address] [phone]       Follow up with Julious Oka, MD On 10/05/2013. (@ 1:15 PM)    Specialty:  Internal Medicine   Contact information:   [place] [address] [phone]       Signed: Bary Leriche 10/09/2013, 10:27 AM

## 2013-09-28 NOTE — Progress Notes (Signed)
Discharged to home accompanied by family. Discharge info given to patient and questions about equipment answered. Belongings packed up and sent with patient. Margarito Liner

## 2013-10-05 ENCOUNTER — Ambulatory Visit: Payer: Medicaid Other | Admitting: Internal Medicine

## 2013-10-20 ENCOUNTER — Emergency Department (HOSPITAL_COMMUNITY): Payer: Medicaid Other

## 2013-10-20 ENCOUNTER — Emergency Department (HOSPITAL_COMMUNITY)
Admission: EM | Admit: 2013-10-20 | Discharge: 2013-10-21 | Disposition: A | Discharge status: Home / Self Care | Payer: Medicaid Other | Attending: Emergency Medicine | Admitting: Emergency Medicine

## 2013-10-20 ENCOUNTER — Encounter (HOSPITAL_COMMUNITY): Payer: Self-pay | Admitting: Emergency Medicine

## 2013-10-20 DIAGNOSIS — Z87891 Personal history of nicotine dependence: Secondary | ICD-10-CM | POA: Insufficient documentation

## 2013-10-20 DIAGNOSIS — K219 Gastro-esophageal reflux disease without esophagitis: Secondary | ICD-10-CM | POA: Insufficient documentation

## 2013-10-20 DIAGNOSIS — Z9861 Coronary angioplasty status: Secondary | ICD-10-CM | POA: Insufficient documentation

## 2013-10-20 DIAGNOSIS — I10 Essential (primary) hypertension: Secondary | ICD-10-CM | POA: Insufficient documentation

## 2013-10-20 DIAGNOSIS — Z8701 Personal history of pneumonia (recurrent): Secondary | ICD-10-CM | POA: Insufficient documentation

## 2013-10-20 DIAGNOSIS — R059 Cough, unspecified: Secondary | ICD-10-CM

## 2013-10-20 DIAGNOSIS — Z8669 Personal history of other diseases of the nervous system and sense organs: Secondary | ICD-10-CM | POA: Insufficient documentation

## 2013-10-20 DIAGNOSIS — Z7982 Long term (current) use of aspirin: Secondary | ICD-10-CM | POA: Insufficient documentation

## 2013-10-20 DIAGNOSIS — I509 Heart failure, unspecified: Secondary | ICD-10-CM | POA: Insufficient documentation

## 2013-10-20 DIAGNOSIS — Z794 Long term (current) use of insulin: Secondary | ICD-10-CM | POA: Insufficient documentation

## 2013-10-20 DIAGNOSIS — R05 Cough: Secondary | ICD-10-CM | POA: Insufficient documentation

## 2013-10-20 DIAGNOSIS — Z8739 Personal history of other diseases of the musculoskeletal system and connective tissue: Secondary | ICD-10-CM | POA: Insufficient documentation

## 2013-10-20 DIAGNOSIS — E1140 Type 2 diabetes mellitus with diabetic neuropathy, unspecified: Secondary | ICD-10-CM | POA: Insufficient documentation

## 2013-10-20 DIAGNOSIS — R0989 Other specified symptoms and signs involving the circulatory and respiratory systems: Secondary | ICD-10-CM | POA: Insufficient documentation

## 2013-10-20 LAB — BASIC METABOLIC PANEL
Anion gap: 13 (ref 5–15)
BUN: 21 mg/dL (ref 6–23)
CO2: 25 meq/L (ref 19–32)
CREATININE: 1.23 mg/dL (ref 0.50–1.35)
Calcium: 9.4 mg/dL (ref 8.4–10.5)
Chloride: 96 mEq/L (ref 96–112)
GFR calc Af Amer: 76 mL/min — ABNORMAL LOW (ref >90)
GFR calc non Af Amer: 65 mL/min — ABNORMAL LOW (ref >90)
Glucose, Bld: 322 mg/dL — ABNORMAL HIGH (ref 70–99)
POTASSIUM: 4.2 meq/L (ref 3.7–5.3)
Sodium: 134 mEq/L — ABNORMAL LOW (ref 137–147)

## 2013-10-20 LAB — HEPATIC FUNCTION PANEL
ALT: 17 U/L (ref 0–53)
AST: 16 U/L (ref 0–37)
Albumin: 3.5 g/dL (ref 3.5–5.2)
Alkaline Phosphatase: 119 U/L — ABNORMAL HIGH (ref 39–117)
BILIRUBIN TOTAL: 0.2 mg/dL — AB (ref 0.3–1.2)
Total Protein: 8.3 g/dL (ref 6.0–8.3)

## 2013-10-20 LAB — I-STAT TROPONIN, ED: TROPONIN I, POC: 0.01 ng/mL (ref 0.00–0.08)

## 2013-10-20 LAB — CBC
HEMATOCRIT: 33 % — AB (ref 39.0–52.0)
Hemoglobin: 10.8 g/dL — ABNORMAL LOW (ref 13.0–17.0)
MCH: 26.5 pg (ref 26.0–34.0)
MCHC: 32.7 g/dL (ref 30.0–36.0)
MCV: 81.1 fL (ref 78.0–100.0)
Platelets: 343 10*3/uL (ref 150–400)
RBC: 4.07 MIL/uL — AB (ref 4.22–5.81)
RDW: 14.4 % (ref 11.5–15.5)
WBC: 8 10*3/uL (ref 4.0–10.5)

## 2013-10-20 LAB — PRO B NATRIURETIC PEPTIDE: Pro B Natriuretic peptide (BNP): 502.3 pg/mL — ABNORMAL HIGH (ref 0–125)
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
## 2013-10-20 NOTE — ED Notes (Signed)
Patient asked for and received a cup of ice chips.

## 2013-10-20 NOTE — ED Notes (Signed)
Pt reports central chest tightness since yesterday with SOB, mild lightheadedness and weakness. States "last time I felt like this it was my CHF." Pt reports NP cough x 3 days. Diminshed in bilateral bases. PT also reports left sided facial numbness x 2 days. No facial droop. Grips equal. No arm drift. Neuro intact. NAD.

## 2013-10-20 NOTE — ED Notes (Signed)
CBG taken = 321

## 2013-10-21 MED ORDER — BENZONATATE 200 MG PO CAPS: 200.0000 mg | ORAL_CAPSULE | Freq: Three times a day (TID) | ORAL | Status: DC | PRN

## 2013-10-21 NOTE — Discharge Instructions (Signed)
Please follow with your primary care doctor in the next 2 days for a check-up. They must obtain records for further management.   Do not hesitate to return to the Emergency Department for any new, worsening or concerning symptoms.    Cough, Adult  A cough is a reflex. It helps you clear your throat and airways. A cough can help heal your body. A cough can last 2 or 3 weeks (acute) or may last more than 8 weeks (chronic). Some common causes of a cough can include an infection, allergy, or a cold. HOME CARE  Only take medicine as told by your doctor.  If given, take your medicines (antibiotics) as told. Finish them even if you start to feel better.  Use a cold steam vaporizer or humidifier in your home. This can help loosen thick spit (secretions).  Sleep so you are almost sitting up (semi-upright). Use pillows to do this. This helps reduce coughing.  Rest as needed.  Stop smoking if you smoke. GET HELP RIGHT AWAY IF:  You have yellowish-white fluid (pus) in your thick spit.  Your cough gets worse.  Your medicine does not reduce coughing, and you are losing sleep.  You cough up blood.  You have trouble breathing.  Your pain gets worse and medicine does not help.  You have a fever. MAKE SURE YOU:   Understand these instructions.  Will watch your condition.  Will get help right away if you are not doing well or get worse. Document Released: 09/03/2010 Document Revised: 05/07/2013 Document Reviewed: 09/03/2010 [facility] Patient Information 2015 [place], [place]. This information is not intended to replace advice given to you by your health care provider. Make sure you discuss any questions you have with your health care provider.

## 2013-10-21 NOTE — ED Provider Notes (Signed)
Medical screening examination/treatment/procedure(s) were performed by non-physician practitioner and as supervising physician I was immediately available for consultation/collaboration.  Richarda Blade, MD 10/21/13 [phone]

## 2013-10-22 LAB — CBG MONITORING, ED: GLUCOSE-CAPILLARY: 321 mg/dL — AB (ref 70–99)

## 2013-10-24 ENCOUNTER — Encounter: Payer: Self-pay | Admitting: Physical Medicine & Rehabilitation

## 2013-10-24 ENCOUNTER — Encounter: Payer: Self-pay | Attending: Physical Medicine & Rehabilitation | Admitting: Physical Medicine & Rehabilitation

## 2013-10-24 VITALS — BP 167/73 | HR 98 | Resp 16

## 2013-10-24 DIAGNOSIS — L97519 Non-pressure chronic ulcer of other part of right foot with unspecified severity: Secondary | ICD-10-CM

## 2013-10-24 DIAGNOSIS — S88111D Complete traumatic amputation at level between knee and ankle, right lower leg, subsequent encounter: Secondary | ICD-10-CM

## 2013-10-24 DIAGNOSIS — G546 Phantom limb syndrome with pain: Secondary | ICD-10-CM | POA: Insufficient documentation

## 2013-10-24 DIAGNOSIS — E11621 Type 2 diabetes mellitus with foot ulcer: Secondary | ICD-10-CM

## 2013-10-24 DIAGNOSIS — Z89511 Acquired absence of right leg below knee: Secondary | ICD-10-CM | POA: Insufficient documentation

## 2013-10-24 NOTE — Patient Instructions (Signed)
CONTACT [facility] ABOUT AN APPOINTMENT TO BEGIN THE PROSTHETIC  PROCESS. YOU SHOULD BE READY TO START IN ABOUT 2-3 WEEKS.  KEEP CLEANING YOUR LEG WITH DIAL SOAP. RUB OFF LOOSE SKIN. AVOID EXCESSIVE MOISTURIZING CREAM UNTIL YOUR WOUND IS COMPLETELY CLOSED.
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
## 2013-11-19 ENCOUNTER — Telehealth: Payer: Self-pay | Admitting: *Deleted

## 2013-11-19 NOTE — Telephone Encounter (Signed)
Pt called,needing help with prescription for a prosthetic leg. Does not understand the process or where to even go to get fitted, asking for a call back and some help

## 2013-11-21 NOTE — Telephone Encounter (Signed)
Called pt back, he has a fitting appt  Today, pt implied that he may have misplace written script, pt said [facility] would contact dr. Naaman Plummer if they need a new script

## 2013-11-21 NOTE — Telephone Encounter (Signed)
I reviewed this and gave him written info at last visit her. He needs to call [facility]---[phone].   I GAVE HIM the rx for the prosthesis when he was here!!

## 2013-12-13 ENCOUNTER — Encounter (HOSPITAL_COMMUNITY): Payer: Self-pay | Admitting: Cardiovascular Disease

## 2014-05-29 ENCOUNTER — Ambulatory Visit (INDEPENDENT_AMBULATORY_CARE_PROVIDER_SITE_OTHER): Payer: Commercial Managed Care - HMO | Admitting: Internal Medicine

## 2014-05-29 ENCOUNTER — Encounter: Payer: Self-pay | Admitting: Internal Medicine

## 2014-05-29 VITALS — BP 196/96 | HR 107 | Temp 98.2°F | Wt 194.9 lb

## 2014-05-29 DIAGNOSIS — I10 Essential (primary) hypertension: Secondary | ICD-10-CM

## 2014-05-29 DIAGNOSIS — K5900 Constipation, unspecified: Secondary | ICD-10-CM

## 2014-05-29 DIAGNOSIS — E1165 Type 2 diabetes mellitus with hyperglycemia: Secondary | ICD-10-CM

## 2014-05-29 DIAGNOSIS — K219 Gastro-esophageal reflux disease without esophagitis: Secondary | ICD-10-CM

## 2014-05-29 DIAGNOSIS — E1151 Type 2 diabetes mellitus with diabetic peripheral angiopathy without gangrene: Secondary | ICD-10-CM

## 2014-05-29 DIAGNOSIS — E785 Hyperlipidemia, unspecified: Secondary | ICD-10-CM

## 2014-05-29 DIAGNOSIS — I739 Peripheral vascular disease, unspecified: Secondary | ICD-10-CM

## 2014-05-29 DIAGNOSIS — Z7982 Long term (current) use of aspirin: Secondary | ICD-10-CM

## 2014-05-29 DIAGNOSIS — D649 Anemia, unspecified: Secondary | ICD-10-CM

## 2014-05-29 DIAGNOSIS — E118 Type 2 diabetes mellitus with unspecified complications: Principal | ICD-10-CM

## 2014-05-29 DIAGNOSIS — E1142 Type 2 diabetes mellitus with diabetic polyneuropathy: Secondary | ICD-10-CM

## 2014-05-29 DIAGNOSIS — IMO0002 Reserved for concepts with insufficient information to code with codable children: Secondary | ICD-10-CM

## 2014-05-29 DIAGNOSIS — Z9114 Patient's other noncompliance with medication regimen: Secondary | ICD-10-CM

## 2014-05-29 LAB — GLUCOSE, CAPILLARY: Glucose-Capillary: >600 mg/dL (ref 65–99)

## 2014-05-29 LAB — COMPREHENSIVE METABOLIC PANEL
ALT: 28 U/L (ref 17–63)
ANION GAP: 13 (ref 5–15)
AST: 24 U/L (ref 15–41)
Albumin: 3.8 g/dL (ref 3.5–5.0)
Alkaline Phosphatase: 95 U/L (ref 38–126)
BUN: 22 mg/dL — AB (ref 6–20)
CHLORIDE: 90 mmol/L — AB (ref 101–111)
CO2: 23 mmol/L (ref 22–32)
Calcium: 9.2 mg/dL (ref 8.9–10.3)
Creatinine, Ser: 1.6 mg/dL — ABNORMAL HIGH (ref 0.61–1.24)
GFR calc Af Amer: 55 mL/min — ABNORMAL LOW (ref >60)
GFR calc non Af Amer: 47 mL/min — ABNORMAL LOW (ref >60)
GLUCOSE: 727 mg/dL — AB (ref 65–99)
Potassium: 4.6 mmol/L (ref 3.5–5.1)
Sodium: 126 mmol/L — ABNORMAL LOW (ref 135–145)
TOTAL PROTEIN: 7.4 g/dL (ref 6.5–8.1)
Total Bilirubin: 0.3 mg/dL (ref 0.3–1.2)

## 2014-05-29 LAB — CBC
HEMATOCRIT: 42.4 % (ref 39.0–52.0)
Hemoglobin: 13.7 g/dL (ref 13.0–17.0)
MCH: 29.5 pg (ref 26.0–34.0)
MCHC: 32.3 g/dL (ref 30.0–36.0)
MCV: 91.2 fL (ref 78.0–100.0)
MPV: 9.9 fL (ref 8.6–12.4)
PLATELETS: 262 10*3/uL (ref 150–400)
RBC: 4.65 MIL/uL (ref 4.22–5.81)
RDW: 13.3 % (ref 11.5–15.5)
WBC: 7 10*3/uL (ref 4.0–10.5)

## 2014-05-29 LAB — POCT GLYCOSYLATED HEMOGLOBIN (HGB A1C)

## 2014-05-29 MED ORDER — ESOMEPRAZOLE MAGNESIUM 20 MG PO CPDR: 20.0000 mg | DELAYED_RELEASE_CAPSULE | Freq: Every day | ORAL | Status: DC

## 2014-05-29 MED ORDER — METFORMIN HCL 500 MG PO TABS: ORAL_TABLET | ORAL | Status: DC

## 2014-05-29 MED ORDER — INSULIN GLARGINE 100 UNIT/ML ~~LOC~~ SOLN: 70.0000 [IU] | Freq: Every day | SUBCUTANEOUS | Status: DC

## 2014-05-29 MED ORDER — INSULIN ASPART 100 UNIT/ML ~~LOC~~ SOLN: 11.0000 [IU] | Freq: Three times a day (TID) | SUBCUTANEOUS | Status: DC

## 2014-05-29 MED ORDER — GABAPENTIN 300 MG PO CAPS: ORAL_CAPSULE | ORAL | Status: DC

## 2014-05-29 MED ORDER — ASPIRIN 325 MG PO TBEC: 325.0000 mg | DELAYED_RELEASE_TABLET | Freq: Every day | ORAL | Status: DC

## 2014-05-29 MED ORDER — ROSUVASTATIN CALCIUM 5 MG PO TABS: 5.0000 mg | ORAL_TABLET | Freq: Every day | ORAL | Status: DC

## 2014-05-29 MED ORDER — POLYETHYLENE GLYCOL 3350 17 G PO PACK: 17.0000 g | PACK | Freq: Two times a day (BID) | ORAL | Status: DC

## 2014-05-29 MED ORDER — AMLODIPINE BESYLATE 10 MG PO TABS: 10.0000 mg | ORAL_TABLET | Freq: Every day | ORAL | Status: DC

## 2014-05-29 MED ORDER — CARVEDILOL 3.125 MG PO TABS: 3.1250 mg | ORAL_TABLET | Freq: Two times a day (BID) | ORAL | Status: DC

## 2014-05-29 NOTE — Patient Instructions (Signed)
Take your medications as directed.   For gabapentin, take once a day for one day, then twice a day for 1 day, and then 3 times a day from then on.   For metformin take 574m at night for 1 week, then 5046mtwice a day for 1 week, then 50055mn the morning and 1000m35m the evening for 1 week, then 1000mg56mce a day.    Metformin tablets What is this medicine? METFORMIN (met FOR min) is used to treat type 2 diabetes. It helps to control blood sugar. Treatment is combined with diet and exercise. This medicine can be used alone or with other medicines for diabetes. This medicine may be used for other purposes; ask your health care provider or pharmacist if you have questions. COMMON BRAND NAME(S): Glucophage What should I tell my health care provider before I take this medicine? They need to know if you have any of these conditions: -anemia -frequently drink alcohol-containing beverages -become easily dehydrated -heart attack -heart failure that is treated with medications -kidney disease -liver disease -polycystic ovary syndrome -serious infection or injury -vomiting -an unusual or allergic reaction to metformin, other medicines, foods, dyes, or preservatives -pregnant or trying to get pregnant -breast-feeding How should I use this medicine? Take this medicine by mouth. Take it with meals. Swallow the tablets with a drink of water. Follow the directions on the prescription label. Take your medicine at regular intervals. Do not take your medicine more often than directed. Talk to your pediatrician regarding the use of this medicine in children. While this drug may be prescribed for children as young as 10 ye59s of age for selected conditions, precautions do apply. Overdosage: If you think you have taken too much of this medicine contact a poison control center or emergency room at once. NOTE: This medicine is only for you. Do not share this medicine with others. What if I miss a  dose? If you miss a dose, take it as soon as you can. If it is almost time for your next dose, take only that dose. Do not take double or extra doses. What may interact with this medicine? Do not take this medicine with any of the following medications: -dofetilide -gatifloxacin -certain contrast medicines given before X-rays, CT scans, MRI, or other procedures This medicine may also interact with the following medications: -digoxin -diuretics -male hormones, like estrogens or progestins and birth control pills -isoniazid -medicines for blood pressure, heart disease, irregular heart beat -morphine -nicotinic acid -phenothiazines like chlorpromazine, mesoridazine, prochlorperazine, thioridazine -phenytoin -procainamide -quinidine -quinine -ranitidine -steroid medicines like prednisone or cortisone -stimulant medicines for attention disorders, weight loss, or to stay awake -thyroid medicines -trimethoprim -vancomycin This list may not describe all possible interactions. Give your health care provider a list of all the medicines, herbs, non-prescription drugs, or dietary supplements you use. Also tell them if you smoke, drink alcohol, or use illegal drugs. Some items may interact with your medicine. What should I watch for while using this medicine? Visit your doctor or health care professional for regular checks on your progress. A test called the HbA1C (A1C) will be monitored. This is a simple blood test. It measures your blood sugar control over the last 2 to 3 months. You will receive this test every 3 to 6 months. Learn how to check your blood sugar. Learn the symptoms of low and high blood sugar and how to manage them. Always carry a quick-source of sugar with you in case you have symptoms  of low blood sugar. Examples include hard sugar candy or glucose tablets. Make sure others know that you can choke if you eat or drink when you develop serious symptoms of low blood sugar, such  as seizures or unconsciousness. They must get medical help at once. Tell your doctor or health care professional if you have high blood sugar. You might need to change the dose of your medicine. If you are sick or exercising more than usual, you might need to change the dose of your medicine. Do not skip meals. Ask your doctor or health care professional if you should avoid alcohol. Many nonprescription cough and cold products contain sugar or alcohol. These can affect blood sugar. This medicine may cause ovulation in premenopausal women who do not have regular monthly periods. This may increase your chances of becoming pregnant. You should not take this medicine if you become pregnant or think you may be pregnant. Talk with your doctor or health care professional about your birth control options while taking this medicine. Contact your doctor or health care professional right away if think you are pregnant. If you are going to need surgery, a MRI, CT scan, or other procedure, tell your doctor that you are taking this medicine. You may need to stop taking this medicine before the procedure. Wear a medical ID bracelet or chain, and carry a card that describes your disease and details of your medicine and dosage times. What side effects may I notice from receiving this medicine? Side effects that you should report to your doctor or health care professional as soon as possible: -allergic reactions like skin rash, itching or hives, swelling of the face, lips, or tongue -breathing problems -feeling faint or lightheaded, falls -muscle aches or pains -signs and symptoms of low blood sugar such as feeling anxious, confusion, dizziness, increased hunger, unusually weak or tired, sweating, shakiness, cold, irritable, headache, blurred vision, fast heartbeat, loss of consciousness -slow or irregular heartbeat -unusual stomach pain or discomfort -unusually tired or weak Side effects that usually do not require  medical attention (report to your doctor or health care professional if they continue or are bothersome): -diarrhea -headache -heartburn -metallic taste in mouth -nausea -stomach gas, upset This list may not describe all possible side effects. Call your doctor for medical advice about side effects. You may report side effects to FDA at 1-800-FDA-1088. Where should I keep my medicine? Keep out of the reach of children. Store at room temperature between 15 and 30 degrees C (59 and 86 degrees F). Protect from moisture and light. Throw away any unused medicine after the expiration date. NOTE: This sheet is a summary. It may not cover all possible information. If you have questions about this medicine, talk to your doctor, pharmacist, or health care provider.  2015, Elsevier/[employer] Standard. (2012-04-04 16:03:44)

## 2014-05-30 DIAGNOSIS — E1140 Type 2 diabetes mellitus with diabetic neuropathy, unspecified: Secondary | ICD-10-CM | POA: Insufficient documentation

## 2014-05-30 DIAGNOSIS — E1142 Type 2 diabetes mellitus with diabetic polyneuropathy: Secondary | ICD-10-CM | POA: Insufficient documentation

## 2014-05-30 DIAGNOSIS — K5900 Constipation, unspecified: Secondary | ICD-10-CM | POA: Insufficient documentation

## 2014-05-30 NOTE — Assessment & Plan Note (Signed)
Refilled gabapentin 300mg  TID. Instructed to start w/ 300mg  x 1 day, 300mg  BID x 1 day, and then 300mg  TID.

## 2014-05-30 NOTE — Progress Notes (Signed)
   Subjective:    Patient ID: [name], male    DOB: [birth year], 54 y.o.   MRN: [id number]  HPI Pt is a 54 y/o male w/ PMHx of DM, HTN, PVD, HLD, rt BKA and left midfoot amputation who presents to clinic for medication refills. Due to his insurance and financial situation patient has been out of all his medications for the past 7 months. He states that he does not feel "bad" and has tried to keep himself safe while off all his meds. He now has medicare and can get his medications. Please see problem list for further details.      Review of Systems  Constitutional: Negative for fever and unexpected weight change.  Eyes: Negative for pain and visual disturbance.  Cardiovascular: Negative for chest pain.  Gastrointestinal: Negative for nausea, vomiting and abdominal pain.       Feels bloated   Endocrine: Positive for polydipsia and polyuria.  Neurological: Negative for dizziness, light-headedness and headaches.       Objective:   Physical Exam  Constitutional: He appears well-developed and well-nourished. No distress.  HENT:  Mouth/Throat: Oropharynx is clear and moist.  Very poor dentition with black cavities on upper palate and decayed teeth  Eyes: Conjunctivae and EOM are normal. Pupils are equal, round, and reactive to light.  Cardiovascular: Normal rate and regular rhythm.   Pulmonary/Chest: Effort normal and breath sounds normal. He has no wheezes.  Abdominal: Soft. Bowel sounds are normal. He exhibits no distension. There is no tenderness.  Musculoskeletal: He exhibits no edema.  Neurological: He is alert.  Skin: Skin is warm and dry.          Assessment & Plan:  Please see problem based assessment and plan.

## 2014-05-30 NOTE — Assessment & Plan Note (Addendum)
Lab Results  Component Value Date   HGBA1C >14.0 05/29/2014   HGBA1C 9.2* 09/16/2013   HGBA1C 9.2* 09/08/2013     Assessment: Diabetes control:  uncontrolled Progress toward A1C goal:   not at goal Comments: pt has been non compliant with DM meds. Endorses polyuria and polydipsia. Orthostatic vitals negative. Stat CMET reveals glucose of 727, normal anion gap, and corrected Na of 136. Likely pt's CBGs have been chronically high, while glucose elevated he is not in DKA. Pt has a glucometer and glucometer supplies at home.   Plan: Medications:  Refilled lantus 70 units qhs and novolog 11-27 units TID w/ meals. Started on metformin 500mg  QD x 1 week, 500mg  BID x 1 week, 1000mg  pm and 500am x 1 week, and then 1000mg  BID. Educated on hypoglycemia and not to take insulin if CBG less than 90-70 Home glucose monitoring: Frequency:  TID Timing:   before meals Instruction/counseling given: reminded to bring blood glucose meter & log to each visit, reminded to bring medications to each visit and discussed foot care =Other plans: foot exam done today, f/u in 1 week to check CBG.

## 2014-05-30 NOTE — Progress Notes (Signed)
Case discussed with Dr. Truong at time of visit. We reviewed the resident's history and exam and pertinent patient test results. I agree with the assessment, diagnosis, and plan of care documented in the resident's note. 

## 2014-05-30 NOTE — Assessment & Plan Note (Signed)
Pt has occasional constipation. Refilled miralax prn.

## 2014-05-30 NOTE — Assessment & Plan Note (Addendum)
Pt endorses stomach bloating but denies heart burn, cough, and metallic taste in mouth.   - refilled nexium 20mg  daily

## 2014-05-30 NOTE — Assessment & Plan Note (Signed)
BP Readings from Last 3 Encounters:  05/29/14 196/96  10/24/13 167/73  10/21/13 155/70    Lab Results  Component Value Date   NA 126* 05/29/2014   K 4.6 05/29/2014   CREATININE 1.60* 05/29/2014    Assessment: Blood pressure control:  uncontrolled Progress toward BP goal:   not at goal Comments: has been out of meds x 7 months. Denies HAs, pressure behind eyes, and n/v.   Plan: Medications:  Refilled coreg 3.125mg  BID, and norvasc 10mg  daily Other plans: f/u next week for BP check

## 2014-05-30 NOTE — Assessment & Plan Note (Addendum)
LDL 76 last year at goal. Likely LDL will be elevated if checked today since he has been non compliant with meds.   -Refilled crestor 5mg .  - repeat lipid panel in 6-12 weeks

## 2014-05-30 NOTE — Assessment & Plan Note (Signed)
Refilled ASA 325mg  and statin.

## 2014-07-29 DIAGNOSIS — S88111A Complete traumatic amputation at level between knee and ankle, right lower leg, initial encounter: Secondary | ICD-10-CM | POA: Diagnosis not present

## 2014-10-23 ENCOUNTER — Encounter: Payer: Commercial Managed Care - HMO | Admitting: Internal Medicine

## 2014-11-20 ENCOUNTER — Ambulatory Visit (INDEPENDENT_AMBULATORY_CARE_PROVIDER_SITE_OTHER): Payer: Commercial Managed Care - HMO | Admitting: Internal Medicine

## 2014-11-20 ENCOUNTER — Encounter: Payer: Self-pay | Admitting: Internal Medicine

## 2014-11-20 VITALS — BP 182/83 | HR 96 | Temp 98.4°F | Ht 72.0 in | Wt 200.9 lb

## 2014-11-20 DIAGNOSIS — I10 Essential (primary) hypertension: Secondary | ICD-10-CM | POA: Diagnosis not present

## 2014-11-20 DIAGNOSIS — E1165 Type 2 diabetes mellitus with hyperglycemia: Secondary | ICD-10-CM

## 2014-11-20 DIAGNOSIS — K219 Gastro-esophageal reflux disease without esophagitis: Secondary | ICD-10-CM | POA: Diagnosis not present

## 2014-11-20 DIAGNOSIS — E785 Hyperlipidemia, unspecified: Secondary | ICD-10-CM

## 2014-11-20 DIAGNOSIS — Z0000 Encounter for general adult medical examination without abnormal findings: Secondary | ICD-10-CM | POA: Diagnosis not present

## 2014-11-20 DIAGNOSIS — IMO0002 Reserved for concepts with insufficient information to code with codable children: Secondary | ICD-10-CM

## 2014-11-20 DIAGNOSIS — E1140 Type 2 diabetes mellitus with diabetic neuropathy, unspecified: Secondary | ICD-10-CM

## 2014-11-20 LAB — POCT GLYCOSYLATED HEMOGLOBIN (HGB A1C): HEMOGLOBIN A1C: 11.3

## 2014-11-20 LAB — GLUCOSE, CAPILLARY: GLUCOSE-CAPILLARY: 174 mg/dL — AB (ref 65–99)

## 2014-11-20 MED ORDER — GABAPENTIN 400 MG PO CAPS: 400.0000 mg | ORAL_CAPSULE | Freq: Three times a day (TID) | ORAL | Status: DC

## 2014-11-20 MED ORDER — METFORMIN HCL 1000 MG PO TABS: 1000.0000 mg | ORAL_TABLET | Freq: Two times a day (BID) | ORAL | Status: DC

## 2014-11-20 MED ORDER — ESOMEPRAZOLE MAGNESIUM 20 MG PO CPDR: 20.0000 mg | DELAYED_RELEASE_CAPSULE | Freq: Every day | ORAL | Status: DC

## 2014-11-20 MED ORDER — AMLODIPINE BESYLATE 10 MG PO TABS: 10.0000 mg | ORAL_TABLET | Freq: Every day | ORAL | Status: DC

## 2014-11-20 NOTE — Progress Notes (Signed)
Subjective:   Patient ID: [name] male   DOB: November 09, 1960 54 y.o.   MRN: [id number]  HPI: [name] is a 54 y.o. w/ PMHx as outlined below who presents to clinic for HTN and DM.   Please see problem list for status of the pt's chronic medical problems.  Past Medical History  Diagnosis Date  . Hyperlipidemia   . Hypertension   . Osteomyelitis 2010    left foot, s/p midfoot amputation  . 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 Neuromuscular disorder     diabetic neruopathy  . PAD (peripheral artery disease)     ABIs 11/30/11: L ABI 0.68, R ABI 0.84  . Pneumonia 2010  . Critical lower limb ischemia, lt with ABI of 0.60 12/31/2011  . PVD (peripheral vascular disease) 12/31/2011  . S/P angioplasty with stent, 12/30/11, of Lt SFA, Post. tibialis and PTA of L. ant and post. tibial vessels 12/31/2011  . Type II diabetes mellitus ~ 2002  . GERD (gastroesophageal reflux disease)   . CHF (congestive heart failure)   . Gangrene     right foot  . Osteomyelitis 09/2013    RT BKA   Current Outpatient Prescriptions  Medication Sig Dispense Refill  . amLODipine (NORVASC) 10 MG tablet Take 1 tablet (10 mg total) by mouth daily. 30 tablet 1  . aspirin 325 MG EC tablet Take 1 tablet (325 mg total) by mouth daily. 30 tablet 11  . carvedilol (COREG) 3.125 MG tablet Take 1 tablet (3.125 mg total) by mouth 2 (two) times daily with a meal. 60 tablet 11  . esomeprazole (NEXIUM) 20 MG capsule Take 1 capsule (20 mg total) by mouth daily. 30 capsule 11  . gabapentin (NEURONTIN) 300 MG capsule Take 300mg  x 1 day, 300mg  BID x 1 day, then can start taking 300mg  TID 90 capsule 11  . insulin aspart (NOVOLOG) 100 UNIT/ML injection Inject 11-27 Units into the skin 3 (three) times daily before meals. 10 mL 11  . insulin glargine (LANTUS) 100 UNIT/ML injection Inject 0.7 mLs (70 Units total) into the skin at bedtime. 40 mL 11  . metFORMIN (GLUCOPHAGE) 500 MG tablet  Take 500mg  at night x 1 week, then 500mg  BID for 1 week, then 500mg  in the morning and 1000mg  in the evening x 1 week, then 1000mg  BID 60 tablet 11  . polyethylene glycol (MIRALAX / GLYCOLAX) packet Take 17 g by mouth 2 (two) times daily. 100 each 6  . rosuvastatin (CRESTOR) 5 MG tablet Take 1 tablet (5 mg total) by mouth at bedtime. 30 tablet 11   No current facility-administered medications for this visit.   Family History  Problem Relation Age of Onset  . Diabetes Mother   . Hypertension Brother   . Hypertension Sister   . Anesthesia problems Neg Hx    Social History   Social History  . Marital Status: Single    Spouse Name: N/A  . Number of Children: N/A  . Years of Education: 11th   Occupational History  .  [employer]   Social History Main Topics  . Smoking status: Former Smoker -- 0.25 packs/day for 24 years    Types: Cigarettes    Quit date: 04/15/2005  . Smokeless tobacco: Never Used  . Alcohol Use: Yes     Comment: "rare- mixed drinks every 5 months"  . Drug Use: No  . Sexual Activity:    Partners:  Female    Birth Control/ Protection: Condom     Comment: one partner   Other Topics Concern  . 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  HENT: Positive for congestion (evening time congestion).   Gastrointestinal: Positive for heartburn (from GERD, has not been taking any meds for it).  Genitourinary: Negative for hematuria.       States he has a sensation of air from his urethra after he finishes urinating, denies penile discharge, erythema, dysuria, has not had intercourse in over a year.  Musculoskeletal:       Neuropathic pain in b/l LE that is burning and and sharp  sensation that starts at the bottom of his LE radiating up  Neurological: Negative for dizziness.    Objective:  Physical Exam: Filed Vitals:   11/20/14 1411  BP: 182/83  Pulse: 96  Temp: 98.4 F (36.9 C)  TempSrc: Oral  Height: 6' (1.829 m)  Weight: 200 lb 14.4 oz (91.128 kg)  SpO2: 100%   Physical Exam  Constitutional: He appears well-developed and well-nourished. No distress.  HENT:  Head: Normocephalic.  Cardiovascular: Normal rate, regular rhythm and normal heart sounds.   Pulmonary/Chest: Effort normal and breath sounds normal. No respiratory distress. He has no wheezes.  Neg for wheezing or congestion on anterior auscultation   Abdominal: Soft. Bowel sounds are normal. He exhibits no distension. There is no tenderness.  Neurological: He is alert.  Skin: Skin is warm and dry.    Assessment & Plan:   Please see problem based assessment and plan.

## 2014-11-20 NOTE — Assessment & Plan Note (Addendum)
Lab Results  Component Value Date   HGBA1C 11.3 11/20/2014   HGBA1C >14.0 05/29/2014   HGBA1C 9.2* 09/16/2013     Assessment: Diabetes control:  uncontrolled Progress toward A1C goal:   not at goal Comments: Pt taking lantus 72 units qd, and novolog 7 units with meals. He states that when his CBGs are in the 80's he feels fatigued and will drink orange juice. He was suppose to be taking metformin 1000mg  BID but is only taking 500mg  BID. He admits to rarely checking his glucose and did not bring in his meter today. Denies CBGs less than 70.   Plan: Medications: continue lantus 72 units and novolog 7 units w/ meals. Start taking metformin 1000mg  BID.  Home glucose monitoring: Frequency:  TID Timing:  w/ meals Instruction/counseling given: reminded to bring blood glucose meter & log to each visit Educational resources provided: handout Self management tools provided:   Other plans: f/u in 1 month for review of glucometer report and adjustment of insulin as needed. He was unable to give a urine sample for microalbumin, will collect at next visit.

## 2014-11-20 NOTE — Assessment & Plan Note (Signed)
Pt complaining of GERD sx: abdominal bloating w/ flatus and belching, night time cough, and globus sensation. He has not been taking GERD medications but states he was previously taking a purple pill for it.   - Refilled nexium, instructed him to take it daily before breakfast.

## 2014-11-20 NOTE — Patient Instructions (Signed)
Start taking gabapentin 400 mg three times a day for your pain.   Start taking Metformin 1000mg  twice a day for your diabetes. We will need a urine sample from you at your next visit in 1 month to monitor your kidney function. Also, please make sure to check your blood sugars three times a day and bring your glucometer with you to your next visit. It is VERY important that we have your glucometer report in order to make changes to your insulin regimen.   Start taking nexium for your acid reflux.

## 2014-11-20 NOTE — Assessment & Plan Note (Signed)
Offered flu shot today but he was in a rush to leave. Will re address at next visit again.

## 2014-11-20 NOTE — Assessment & Plan Note (Signed)
Pt has peripheral neuropathy that he states is not controlled w/ gabapentin 300mg  TID. He is requesting tramadol which he states worked for him in the past and was also refilled in August 2015. He states he was never able to get it. His pain starts in his LE and radiates upwards.  - increased gabapentin to 400mg  TID which can be titrated up at his next visit. Will reassess pain at next visit and prescribe tramadol if pain is not controlled w/ gabapentin.

## 2014-11-20 NOTE — Assessment & Plan Note (Signed)
Will get lipid panel at next visit and increase crestor if needed due to PVD.

## 2014-11-20 NOTE — Assessment & Plan Note (Signed)
BP Readings from Last 3 Encounters:  11/20/14 182/83  05/29/14 196/96  10/24/13 167/73    Lab Results  Component Value Date   NA 126* 05/29/2014   K 4.6 05/29/2014   CREATININE 1.60* 05/29/2014    Assessment: Blood pressure control:  uncontrolled Progress toward BP goal:   not at goal Comments: pt has been out of norvasc 10mg . He is also taking coreg BID. He states his BP was elevated today b/c he had to rush to clinic and had an incident in the parking lot that irritated him. He was in a rush to leave clinic before his BP could be rechecked.   Plan: Medications:  continue current medications, refilled norvasc Educational resources provided: handout Self management tools provided:   Other plans: RTC in 1 month.

## 2014-11-22 NOTE — Progress Notes (Signed)
[facility] Clinic Attending  Case discussed with Dr. Truong at the time of the visit.  We reviewed the resident's history and exam and pertinent patient test results.  I agree with the assessment, diagnosis, and plan of care documented in the resident's note.  

## 2014-12-03 ENCOUNTER — Other Ambulatory Visit: Payer: Self-pay | Admitting: *Deleted

## 2014-12-03 ENCOUNTER — Telehealth: Payer: Self-pay | Admitting: Internal Medicine

## 2014-12-03 DIAGNOSIS — K219 Gastro-esophageal reflux disease without esophagitis: Secondary | ICD-10-CM

## 2014-12-03 NOTE — Telephone Encounter (Signed)
Pt states he can not get esomeprazole because the dosage is low and the pharmacy does not have it. Please call the pt back.

## 2014-12-03 NOTE — Telephone Encounter (Signed)
Called pharm after speaking with pt, [facility] does not carry the 20mg  as a script, it is otc. Pt would like you to change his dose to 40mg  daily so insurance may possibly pay for it. otc cost is $17.00/ month

## 2014-12-04 MED ORDER — ESOMEPRAZOLE MAGNESIUM 40 MG PO CPDR: 40.0000 mg | DELAYED_RELEASE_CAPSULE | Freq: Every day | ORAL | Status: DC

## 2015-03-18 ENCOUNTER — Telehealth: Payer: Self-pay | Admitting: Internal Medicine

## 2015-03-18 NOTE — Telephone Encounter (Signed)
APPT. REMINDER CALL, LMTCB °

## 2015-03-19 ENCOUNTER — Encounter: Payer: Self-pay | Admitting: Internal Medicine

## 2015-03-19 ENCOUNTER — Ambulatory Visit (INDEPENDENT_AMBULATORY_CARE_PROVIDER_SITE_OTHER): Payer: Commercial Managed Care - HMO | Admitting: Internal Medicine

## 2015-03-19 VITALS — BP 192/84 | HR 93 | Temp 98.0°F | Ht 72.0 in | Wt 198.8 lb

## 2015-03-19 DIAGNOSIS — I10 Essential (primary) hypertension: Secondary | ICD-10-CM

## 2015-03-19 DIAGNOSIS — E1142 Type 2 diabetes mellitus with diabetic polyneuropathy: Secondary | ICD-10-CM | POA: Diagnosis not present

## 2015-03-19 DIAGNOSIS — Z79899 Other long term (current) drug therapy: Secondary | ICD-10-CM

## 2015-03-19 DIAGNOSIS — Z89432 Acquired absence of left foot: Secondary | ICD-10-CM | POA: Diagnosis not present

## 2015-03-19 DIAGNOSIS — E118 Type 2 diabetes mellitus with unspecified complications: Principal | ICD-10-CM

## 2015-03-19 DIAGNOSIS — E1151 Type 2 diabetes mellitus with diabetic peripheral angiopathy without gangrene: Secondary | ICD-10-CM | POA: Diagnosis not present

## 2015-03-19 DIAGNOSIS — E1165 Type 2 diabetes mellitus with hyperglycemia: Secondary | ICD-10-CM

## 2015-03-19 DIAGNOSIS — IMO0002 Reserved for concepts with insufficient information to code with codable children: Secondary | ICD-10-CM

## 2015-03-19 DIAGNOSIS — Z0000 Encounter for general adult medical examination without abnormal findings: Secondary | ICD-10-CM

## 2015-03-19 DIAGNOSIS — Z89511 Acquired absence of right leg below knee: Secondary | ICD-10-CM

## 2015-03-19 DIAGNOSIS — Z794 Long term (current) use of insulin: Secondary | ICD-10-CM | POA: Diagnosis not present

## 2015-03-19 DIAGNOSIS — E785 Hyperlipidemia, unspecified: Secondary | ICD-10-CM

## 2015-03-19 LAB — GLUCOSE, CAPILLARY: Glucose-Capillary: 270 mg/dL — ABNORMAL HIGH (ref 65–99)

## 2015-03-19 LAB — POCT GLYCOSYLATED HEMOGLOBIN (HGB A1C): HEMOGLOBIN A1C: 11.4

## 2015-03-19 MED ORDER — INSULIN GLARGINE 100 UNIT/ML ~~LOC~~ SOLN: 72.0000 [IU] | Freq: Every day | SUBCUTANEOUS | Status: DC

## 2015-03-19 MED ORDER — INSULIN ASPART 100 UNIT/ML ~~LOC~~ SOLN: 11.0000 [IU] | Freq: Three times a day (TID) | SUBCUTANEOUS | Status: DC

## 2015-03-19 MED ORDER — PREGABALIN 50 MG PO CAPS: 50.0000 mg | ORAL_CAPSULE | Freq: Three times a day (TID) | ORAL | Status: DC

## 2015-03-19 MED ORDER — CARVEDILOL 6.25 MG PO TABS: 6.2500 mg | ORAL_TABLET | Freq: Two times a day (BID) | ORAL | Status: DC

## 2015-03-19 NOTE — Progress Notes (Signed)
Subjective:   Patient ID: [name] male   DOB: 22-Dec-1960 55 y.o.   MRN: [id number]  HPI: [name] is a 55 y.o. with past medical history as outlined below who presents to clinic for diabetes follow up.   Please see problem list for status of the pt's chronic medical problems.  Past Medical History  Diagnosis Date  . Hyperlipidemia   . Hypertension   . Osteomyelitis ([place]) 2010    left foot, s/p midfoot amputation  . Osteomyelitis of ankle or foot ([place]) 05/2011    rt foot, s/p 5th ray amputation  . Neuromuscular disorder ([place])     diabetic neruopathy  . PAD (peripheral artery disease) ([place])     ABIs 11/30/11: L ABI 0.68, R ABI 0.84  . Pneumonia 2010  . Critical lower limb ischemia, lt with ABI of 0.60 12/31/2011  . PVD (peripheral vascular disease) ([place]) 12/31/2011  . S/P angioplasty with stent, 12/30/11, of Lt SFA, Post. tibialis and PTA of L. ant and post. tibial vessels 12/31/2011  . Type II diabetes mellitus ([place]) ~ 2002  . GERD (gastroesophageal reflux disease)   . CHF (congestive heart failure) ([place])   . Gangrene ([place])     right foot  . 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 amLODipine (NORVASC) 10 MG tablet Take 1 tablet (10 mg total) by mouth daily. 30 tablet 1  . aspirin 325 MG EC tablet Take 1 tablet (325 mg total) by mouth daily. 30 tablet 11  . carvedilol (COREG) 3.125 MG tablet Take 1 tablet (3.125 mg total) by mouth 2 (two) times daily with a meal. 60 tablet 11  . esomeprazole (NEXIUM) 40 MG capsule Take 1 capsule (40 mg total) by mouth daily. 30 capsule 6  . gabapentin (NEURONTIN) 400 MG capsule Take 1 capsule (400 mg total) by mouth 3 (three) times daily. 90 capsule 3  . insulin aspart (NOVOLOG) 100 UNIT/ML injection Inject 11-27 Units into the skin 3 (three) times daily before meals. 10 mL 11  . insulin glargine (LANTUS) 100 UNIT/ML injection Inject 0.7 mLs (70 Units total) into the  skin at bedtime. 40 mL 11  . metFORMIN (GLUCOPHAGE) 1000 MG tablet Take 1 tablet (1,000 mg total) by mouth 2 (two) times daily with a meal. 60 tablet 11  . polyethylene glycol (MIRALAX / GLYCOLAX) packet Take 17 g by mouth 2 (two) times daily. 100 each 6  . rosuvastatin (CRESTOR) 5 MG tablet Take 1 tablet (5 mg total) by mouth at bedtime. 30 tablet 11   No current facility-administered medications for this visit.   Family History  Problem Relation Age of Onset  . Diabetes Mother   . Hypertension Brother   . Hypertension Sister   . Anesthesia problems Neg Hx    Social History   Social History  . Marital Status: Single    Spouse Name: N/A  . Number of Children: N/A  . Years of Education: 11th   Occupational History  .  [employer]   Social History Main Topics  . Smoking status: Current Some Day Smoker -- 0.25 packs/day for 24 years    Types: Cigarettes    Last Attempt to Quit: 04/15/2005  . Smokeless tobacco: Never Used     Comment: STARTED BACK SMOKING 2-017  . Alcohol Use: 0.0 oz/week    0 Standard drinks or equivalent per week     Comment: "rare- mixed  drinks every 5 months"  . Drug Use: No  . Sexual Activity:    Partners: Female    Patent examiner Protection: Condom     Comment: one partner   Other Topics Concern  . 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 Describes pain as a sharp, shooting pain. Pain will wake him up from sleep.   Neurological: Positive for headaches (headaches when he takes ibuprofen, took ibuprofen this morning).    Objective:  Physical Exam: Filed Vitals:   03/19/15 1440  BP: 192/84  Pulse: 93  Temp: 98 F  (36.7 C)  TempSrc: Oral  Height: 6' (1.829 m)  Weight: 198 lb 12.8 oz (90.175 kg)  SpO2: 100%   Physical Exam  Constitutional: He appears well-developed and well-nourished. No distress.  HENT:  Head: Normocephalic and atraumatic.  Eyes: Conjunctivae and EOM are normal.  Cardiovascular: Normal rate, regular rhythm and normal heart sounds.  Exam reveals no gallop and no friction rub.   No murmur heard. Pulmonary/Chest: Effort normal and breath sounds normal. No respiratory distress. He exhibits no tenderness.  Abdominal: Bowel sounds are normal. He exhibits no distension.  Skin: Skin is warm and dry. He is not diaphoretic.  Psychiatric: His affect is angry. He is aggressive.  Angry, stormed out of the room when he found out he was not getting tramadol rx.     Assessment & Plan:   Please see problem based assessment and plan.

## 2015-03-19 NOTE — Patient Instructions (Signed)
Take lyrica 50mg  three times a day for 1 week. Then start taking 100mg  (2 tabs) three times a day.   We will see you in 2 weeks to follow up on pain control.   Make sure to check your blood sugar 3 times a day and bring your glucometer to this visit with you.   Start taking coreg 6.25mg  twice a day for your blood pressure.
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
## 2015-03-20 NOTE — Progress Notes (Signed)
Internal Medicine Clinic Attending  I saw and evaluated the patient.  I personally confirmed the key portions of the history and exam documented by Dr. Truong and I reviewed pertinent patient test results.  The assessment, diagnosis, and plan were formulated together and I agree with the documentation in the resident's note.  

## 2015-03-20 NOTE — Assessment & Plan Note (Addendum)
Lab Results  Component Value Date   HGBA1C 11.4 03/19/2015   HGBA1C 11.3 11/20/2014   HGBA1C >14.0 05/29/2014     Assessment: Diabetes control:   uncontrolled Progress toward A1C goal:   deteriorated Comments: he is on lantus 72 units qhs, novolog 21 units w/ meals, and metfomin 1000mg  BID. From chart review patient has never brought his glucometer to his clinic visits for review. He has not been complaint with diabetes follow ups. His diabetes has never been controlled since 2010 when his first hemoglobin A1c was >13.0. He did not bring in his glucometer to this visit as well. He complains of sharp, shooting pain in all 4 extremities, he previously only had pain in his feet and now has it on both hands. This is due to his peripheral neuropathy from his long history of uncontrolled diabetes. 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  Plan: Medications:  continue current medications Home glucose monitoring: Frequency:  TID Timing:  w/ meals Instruction/counseling given: reminded to bring blood glucose meter & log to each visit Educational resources provided:   Self management tools provided:    Other plans: pt will follow up in 2 weeks after his has tried a course of lyrica for his peripheral neuropathy.  He will need to give a urine sample for a microalbumin level to monitor renal function as his is not on a  ACE inhibitor and has uncontrolled htn. If his BP is elevated will start an ACEinh.

## 2015-03-20 NOTE — Assessment & Plan Note (Addendum)
Pt on a low dose statin, rosuvastatin 5mg . Lipid panel was ordered.   - pt left without getting blood labs, will get at 2 week follow up.

## 2015-03-20 NOTE — Assessment & Plan Note (Signed)
BP Readings from Last 3 Encounters:  03/19/15 192/84  11/20/14 182/83  05/29/14 196/96    Lab Results  Component Value Date   NA 126* 05/29/2014   K 4.6 05/29/2014   CREATININE 1.60* 05/29/2014    Assessment: Blood pressure control:  uncontrolled Progress toward BP goal:   not at goal Comments: Pt is on coreg 3.125 BID and norvasc 10mg  daily. He states his BP was elevated b/c from walking into clinic. On recheck his SBP was 158.   Plan: Medications:  Continue norvasc 10mg , increased coreg to 6.25mg  BID Educational resources provided:   Self management tools provided:   Other plans: f/u in 2 weeks. Due to distraction from patient's fixation on tramadol, an ACE inh was not started for his BP. Will reassess in 2 weeks and start him on an ACEinh if BP still elevated.

## 2015-03-20 NOTE — Assessment & Plan Note (Signed)
Referral placed for eye exam and colonoscopy.

## 2015-04-02 ENCOUNTER — Encounter: Payer: Commercial Managed Care - HMO | Admitting: Internal Medicine

## 2015-04-14 ENCOUNTER — Telehealth: Payer: Self-pay | Admitting: Internal Medicine

## 2015-04-14 NOTE — Telephone Encounter (Signed)
APPT. REMINDER CALL, LMTCB °

## 2015-04-16 ENCOUNTER — Encounter: Payer: Self-pay | Admitting: Internal Medicine

## 2015-04-16 ENCOUNTER — Ambulatory Visit (INDEPENDENT_AMBULATORY_CARE_PROVIDER_SITE_OTHER): Payer: Commercial Managed Care - HMO | Admitting: Internal Medicine

## 2015-04-16 VITALS — BP 175/88 | HR 93 | Temp 98.3°F | Ht 72.0 in | Wt 201.8 lb

## 2015-04-16 DIAGNOSIS — K219 Gastro-esophageal reflux disease without esophagitis: Secondary | ICD-10-CM

## 2015-04-16 DIAGNOSIS — E785 Hyperlipidemia, unspecified: Secondary | ICD-10-CM

## 2015-04-16 DIAGNOSIS — Z794 Long term (current) use of insulin: Secondary | ICD-10-CM

## 2015-04-16 DIAGNOSIS — I110 Hypertensive heart disease with heart failure: Secondary | ICD-10-CM | POA: Diagnosis not present

## 2015-04-16 DIAGNOSIS — Z79899 Other long term (current) drug therapy: Secondary | ICD-10-CM

## 2015-04-16 DIAGNOSIS — IMO0002 Reserved for concepts with insufficient information to code with codable children: Secondary | ICD-10-CM

## 2015-04-16 DIAGNOSIS — E1165 Type 2 diabetes mellitus with hyperglycemia: Secondary | ICD-10-CM

## 2015-04-16 DIAGNOSIS — I10 Essential (primary) hypertension: Secondary | ICD-10-CM

## 2015-04-16 DIAGNOSIS — E1142 Type 2 diabetes mellitus with diabetic polyneuropathy: Secondary | ICD-10-CM

## 2015-04-16 DIAGNOSIS — I5032 Chronic diastolic (congestive) heart failure: Secondary | ICD-10-CM | POA: Diagnosis not present

## 2015-04-16 DIAGNOSIS — E118 Type 2 diabetes mellitus with unspecified complications: Principal | ICD-10-CM

## 2015-04-16 DIAGNOSIS — Z0000 Encounter for general adult medical examination without abnormal findings: Secondary | ICD-10-CM | POA: Diagnosis not present

## 2015-04-16 MED ORDER — ROSUVASTATIN CALCIUM 5 MG PO TABS: 5.0000 mg | ORAL_TABLET | Freq: Every day | ORAL | Status: DC

## 2015-04-16 MED ORDER — GABAPENTIN 400 MG PO CAPS: 300.0000 mg | ORAL_CAPSULE | Freq: Three times a day (TID) | ORAL | Status: DC

## 2015-04-16 MED ORDER — BLOOD GLUCOSE MONITOR KIT: PACK | Status: DC

## 2015-04-16 MED ORDER — METFORMIN HCL 1000 MG PO TABS: 1000.0000 mg | ORAL_TABLET | Freq: Two times a day (BID) | ORAL | Status: DC

## 2015-04-16 MED ORDER — INSULIN GLARGINE 100 UNIT/ML ~~LOC~~ SOLN: 72.0000 [IU] | Freq: Every day | SUBCUTANEOUS | Status: DC

## 2015-04-16 MED ORDER — INSULIN ASPART 100 UNIT/ML ~~LOC~~ SOLN: 11.0000 [IU] | Freq: Three times a day (TID) | SUBCUTANEOUS | Status: DC

## 2015-04-16 MED ORDER — GABAPENTIN 300 MG PO CAPS: 300.0000 mg | ORAL_CAPSULE | Freq: Three times a day (TID) | ORAL | Status: DC

## 2015-04-16 MED ORDER — AMLODIPINE BESYLATE 10 MG PO TABS: 10.0000 mg | ORAL_TABLET | Freq: Every day | ORAL | Status: DC

## 2015-04-16 MED ORDER — CARVEDILOL 6.25 MG PO TABS: 6.2500 mg | ORAL_TABLET | Freq: Two times a day (BID) | ORAL | Status: DC

## 2015-04-16 MED ORDER — ESOMEPRAZOLE MAGNESIUM 40 MG PO CPDR: 40.0000 mg | DELAYED_RELEASE_CAPSULE | Freq: Every day | ORAL | Status: DC

## 2015-04-16 MED FILL — ROSUVASTATIN CALCIUM 5 MG T: 5 | 30 days supply | Qty: 30 | Fill #0

## 2015-04-16 MED FILL — NovoLOG 100 UNIT/ML SOLN: 100 | 12 days supply | Qty: 10 | Fill #0

## 2015-04-16 MED FILL — metFORMIN HCL 1000 MG TABS: 1000 | 30 days supply | Qty: 60 | Fill #0

## 2015-04-16 MED FILL — ESOMEPRAZOLE MAG DR 40 MG C: 40 | 30 days supply | Qty: 30 | Fill #0

## 2015-04-16 MED FILL — CARVEDILOL 6.25 MG TABLET: 6.25 | 30 days supply | Qty: 60 | Fill #0

## 2015-04-16 MED FILL — LANTUS 100 UNITS/ML VIAL: 100 | 55 days supply | Qty: 40 | Fill #0

## 2015-04-16 MED FILL — AMLODIPINE BESYLATE 10 MG T: 10 | 30 days supply | Qty: 30 | Fill #0

## 2015-04-16 MED FILL — GABAPENTIN 400 MG CAPSULE: 400 | 30 days supply | Qty: 90 | Fill #0

## 2015-04-16 NOTE — Patient Instructions (Addendum)
Start taking all of your medications as directed.   If are unable to get your glucometer then please call the clinic for an appointment with [name].   Check your blood sugar three times a day!  We will see you in 1 month.

## 2015-04-21 NOTE — Progress Notes (Signed)
Case discussed with Dr. Truong at the time of the visit.  We reviewed the resident's history and exam and pertinent patient test results.  I agree with the assessment, diagnosis, and plan of care documented in the resident's note. 

## 2015-04-21 NOTE — Assessment & Plan Note (Addendum)
Refilled crestor. Will check lipid panel at next visit.

## 2015-04-21 NOTE — Assessment & Plan Note (Signed)
BP Readings from Last 3 Encounters:  04/16/15 175/88  03/19/15 192/84  11/20/14 182/83    Lab Results  Component Value Date   NA 126* 05/29/2014   K 4.6 05/29/2014   CREATININE 1.60* 05/29/2014    Assessment: Blood pressure control:  not controlled Progress toward BP goal:   not at goal Comments: pt has not been taking norvasc but reports taking coreg 6.25mg  BID.   Plan: Medications:  continue current medications Educational resources provided:   Self management tools provided:   Other plans: refilled norvasc 10mg . Urine microalbumin extremely elevated due to long hx of uncontrolled DM. Can consider starting ACEinh at next visit.

## 2015-04-21 NOTE — Progress Notes (Signed)
Subjective:   Patient ID: [name] male   DOB: 1960/05/22 55 y.o.   MRN: [id number]  HPI: [name] is a 55 y.o. with past medical history as outlined below who presents to clinic for DM f/u.   Please see problem list for status of the pt's chronic medical problems.  Past Medical History  Diagnosis Date  . Hyperlipidemia   . Hypertension   . Osteomyelitis ([place]) 2010    left foot, s/p midfoot amputation  . Osteomyelitis of ankle or foot ([place]) 05/2011    rt foot, s/p 5th ray amputation  . Neuromuscular disorder ([place])     diabetic neruopathy  . PAD (peripheral artery disease) ([place])     ABIs 11/30/11: L ABI 0.68, R ABI 0.84  . Pneumonia 2010  . Critical lower limb ischemia, lt with ABI of 0.60 12/31/2011  . PVD (peripheral vascular disease) ([place]) 12/31/2011  . S/P angioplasty with stent, 12/30/11, of Lt SFA, Post. tibialis and PTA of L. ant and post. tibial vessels 12/31/2011  . Type II diabetes mellitus ([place]) ~ 2002  . GERD (gastroesophageal reflux disease)   . CHF (congestive heart failure) ([place])   . Gangrene ([place])     right foot  . Osteomyelitis ([place]) 09/2013    RT BKA   Current Outpatient Prescriptions  Medication Sig Dispense Refill  . amLODipine (NORVASC) 10 MG tablet Take 1 tablet (10 mg total) by mouth daily. 30 tablet 1  . aspirin 325 MG EC tablet Take 1 tablet (325 mg total) by mouth daily. 30 tablet 11  . blood glucose meter kit and supplies KIT Dispense based on patient and insurance preference. Use up to four times daily as directed. (FOR ICD-9 250.00, 250.01). 1 each 0  . carvedilol (COREG) 6.25 MG tablet Take 1 tablet (6.25 mg total) by mouth 2 (two) times daily with a meal. 60 tablet 3  . esomeprazole (NEXIUM) 40 MG capsule Take 1 capsule (40 mg total) by mouth daily. 30 capsule 6  . gabapentin (NEURONTIN) 400 MG capsule Take 1 capsule (400 mg total) by mouth 3 (three) times daily. 90 capsule 2  . insulin aspart (NOVOLOG) 100 UNIT/ML injection Inject 11-27  Units into the skin 3 (three) times daily before meals. 10 mL 11  . insulin glargine (LANTUS) 100 UNIT/ML injection Inject 0.72 mLs (72 Units total) into the skin at bedtime. 40 mL 11  . metFORMIN (GLUCOPHAGE) 1000 MG tablet Take 1 tablet (1,000 mg total) by mouth 2 (two) times daily with a meal. 60 tablet 11  . polyethylene glycol (MIRALAX / GLYCOLAX) packet Take 17 g by mouth 2 (two) times daily. 100 each 6  . rosuvastatin (CRESTOR) 5 MG tablet Take 1 tablet (5 mg total) by mouth at bedtime. 30 tablet 11   No current facility-administered medications for this visit.   Family History  Problem Relation Age of Onset  . Diabetes Mother   . Hypertension Brother   . Hypertension Sister   . Anesthesia problems Neg Hx    Social History   Social History  . Marital Status: Single    Spouse Name: N/A  . Number of Children: N/A  . Years of Education: 11th   Occupational History  .  [employer]   Social History Main Topics  . Smoking status: Current Some Day Smoker -- 0.25 packs/day for 24 years    Types: Cigarettes    Last Attempt to Quit: 04/15/2005  . Smokeless tobacco: Never Used  Comment: STARTED BACK SMOKING 2-017  . Alcohol Use: 0.0 oz/week    0 Standard drinks or equivalent per week     Comment: "rare- mixed drinks every 5 months"  . Drug Use: No  . Sexual Activity:    Partners: Female    Patent examiner Protection: Condom     Comment: one partner   Other Topics Concern  . 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  Respiratory: Negative for shortness of breath.   Cardiovascular: Negative for chest pain.  Gastrointestinal: Negative for nausea, vomiting and abdominal pain.    Musculoskeletal:       Hand pain has improved    Objective:  Physical Exam: Filed Vitals:   04/16/15 1352  BP: 175/88  Pulse: 93  Temp: 98.3 F (36.8 C)  TempSrc: Oral  Height: 6' (1.829 m)  Weight: 201 lb 12.8 oz (91.536 kg)  SpO2: 100%   Physical Exam  Constitutional: He appears well-developed and well-nourished. No distress.  HENT:  Head: Normocephalic and atraumatic.  Nose: Nose normal.  Cardiovascular: Normal rate, regular rhythm and normal heart sounds.  Exam reveals no gallop and no friction rub.   No murmur heard. Pulmonary/Chest: Effort normal and breath sounds normal. No respiratory distress. He has no wheezes. He has no rales.  Abdominal: Soft. Bowel sounds are normal. He exhibits no distension and no mass. There is no tenderness. There is no rebound and no guarding.  Skin: Skin is warm and dry. No rash noted. He is not diaphoretic. No erythema. No pallor.    Assessment & Plan:   Please see problem based assessment and plan.
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
## 2015-04-21 NOTE — Assessment & Plan Note (Signed)
Refilled nexium today.

## 2015-04-21 NOTE — Assessment & Plan Note (Signed)
Grade 1 diastolic dysfunction

## 2015-04-21 NOTE — Assessment & Plan Note (Signed)
Pt has been eating healthier and started meditating. He believes this has helped with his hand pain which has improved from last months visit when he was demanding tramadol for pain. He never tried lyrica that was prescribed that visit stating that his friend who recently past away experience side effects from this medication and later died of a MI. He is requesting to go back on gabapentin.   - refilled gabapentin 400mg  TID

## 2015-04-22 DIAGNOSIS — E113391 Type 2 diabetes mellitus with moderate nonproliferative diabetic retinopathy without macular edema, right eye: Secondary | ICD-10-CM | POA: Diagnosis not present

## 2015-04-22 DIAGNOSIS — H2513 Age-related nuclear cataract, bilateral: Secondary | ICD-10-CM | POA: Diagnosis not present

## 2015-04-22 DIAGNOSIS — E113592 Type 2 diabetes mellitus with proliferative diabetic retinopathy without macular edema, left eye: Secondary | ICD-10-CM | POA: Diagnosis not present

## 2015-04-22 DIAGNOSIS — H4312 Vitreous hemorrhage, left eye: Secondary | ICD-10-CM | POA: Diagnosis not present

## 2015-04-22 LAB — MICROALBUMIN / CREATININE URINE RATIO
CREATININE, UR: 78.2 mg/dL
MICROALB/CREAT RATIO: 4996.4 mg/g{creat} — AB (ref 0.0–30.0)
Microalbumin, Urine: 3907.2 ug/mL

## 2015-04-22 LAB — HM DIABETES EYE EXAM

## 2015-04-22 LAB — TOXASSURE SELECT,+ANTIDEPR,UR: PDF: 0

## 2015-04-28 DIAGNOSIS — H4312 Vitreous hemorrhage, left eye: Secondary | ICD-10-CM | POA: Diagnosis not present

## 2015-04-28 DIAGNOSIS — E113391 Type 2 diabetes mellitus with moderate nonproliferative diabetic retinopathy without macular edema, right eye: Secondary | ICD-10-CM | POA: Diagnosis not present

## 2015-04-28 DIAGNOSIS — H43822 Vitreomacular adhesion, left eye: Secondary | ICD-10-CM | POA: Diagnosis not present

## 2015-04-28 DIAGNOSIS — E113592 Type 2 diabetes mellitus with proliferative diabetic retinopathy without macular edema, left eye: Secondary | ICD-10-CM | POA: Diagnosis not present

## 2015-04-28 DIAGNOSIS — H2513 Age-related nuclear cataract, bilateral: Secondary | ICD-10-CM | POA: Diagnosis not present

## 2015-04-30 ENCOUNTER — Encounter: Payer: Self-pay | Admitting: *Deleted

## 2015-05-15 ENCOUNTER — Encounter: Payer: Commercial Managed Care - HMO | Admitting: Internal Medicine

## 2015-05-15 ENCOUNTER — Encounter: Payer: Self-pay | Admitting: Internal Medicine

## 2015-06-05 NOTE — Addendum Note (Signed)
Addended by: Hulan Fray on: 06/05/2015 08:46 AM   Modules accepted: Orders

## 2015-06-11 NOTE — Addendum Note (Signed)
Addended by: Norman Herrlich on: 06/11/2015 03:46 PM   Modules accepted: Orders

## 2015-06-25 ENCOUNTER — Encounter: Payer: Commercial Managed Care - HMO | Admitting: Internal Medicine

## 2015-07-07 ENCOUNTER — Ambulatory Visit (INDEPENDENT_AMBULATORY_CARE_PROVIDER_SITE_OTHER): Payer: Commercial Managed Care - HMO | Admitting: Internal Medicine

## 2015-07-07 VITALS — BP 167/71 | HR 98 | Temp 98.3°F | Wt 191.9 lb

## 2015-07-07 DIAGNOSIS — E1142 Type 2 diabetes mellitus with diabetic polyneuropathy: Secondary | ICD-10-CM | POA: Diagnosis not present

## 2015-07-07 DIAGNOSIS — I10 Essential (primary) hypertension: Secondary | ICD-10-CM

## 2015-07-07 LAB — GLUCOSE, CAPILLARY: Glucose-Capillary: 272 mg/dL — ABNORMAL HIGH (ref 65–99)

## 2015-07-07 LAB — POCT GLYCOSYLATED HEMOGLOBIN (HGB A1C): Hemoglobin A1C: >14

## 2015-07-07 MED ORDER — LISINOPRIL 10 MG PO TABS: 10.0000 mg | ORAL_TABLET | Freq: Every day | ORAL | Status: DC

## 2015-07-07 MED FILL — LISINOPRIL 10 MG TABLET: 10 | 30 days supply | Qty: 30 | Fill #0

## 2015-07-07 NOTE — Progress Notes (Signed)
Patient ID: [name], male   DOB: 1960/11/08, 55 y.o.   MRN: [id number]   CC: numbness and tingling in my hands HPI: [name] is a 55 y.o. man presenting today due to feeling like his hands are more cold for the last 1.5 months.  Describes symptoms of having less strength, numbness and tingling, achy, and reports having dropped multiple plates at work due to them slipping out of his hands.  He believes that the symptoms are different than his typical DM neuropathy pain because he does not have the sharp, shooting pains.  He cannot think of anything in particular that aggravates his symptoms but states they are relieved by wearing gloves at which point he states his symptoms are not noticeable.  Please see A&P for status of medical conditions addressed today.    Past Medical History  Diagnosis Date  . Hyperlipidemia   . Hypertension   . Osteomyelitis ([place]) 2010    left foot, s/p midfoot amputation  . Osteomyelitis of ankle or foot ([place]) 05/2011    rt foot, s/p 5th ray amputation  . Neuromuscular disorder ([place])     diabetic neruopathy  . PAD (peripheral artery disease) ([place])     ABIs 11/30/11: L ABI 0.68, R ABI 0.84  . Pneumonia 2010  . Critical lower limb ischemia, lt with ABI of 0.60 12/31/2011  . PVD (peripheral vascular disease) ([place]) 12/31/2011  . S/P angioplasty with stent, 12/30/11, of Lt SFA, Post. tibialis and PTA of L. ant and post. tibial vessels 12/31/2011  . Type II diabetes mellitus ([place]) ~ 2002  . GERD (gastroesophageal reflux disease)   . CHF (congestive heart failure) ([place])   . Gangrene ([place])     right foot  . Osteomyelitis ([place]) 09/2013    RT BKA   Review of Systems: Review of Systems  Constitutional: Positive for malaise/fatigue. Negative for fever and chills.  Cardiovascular: Negative for chest pain.  Gastrointestinal: Negative for abdominal pain.  Neurological: Positive for tingling (hands) and sensory change (hands).    Physical Exam: Filed Vitals:    07/07/15 1428  BP: 167/71  Pulse: 98  Temp: 98.3 F (36.8 C)  TempSrc: Oral  Weight: 191 lb 14.4 oz (87.045 kg)  SpO2: 100%   Physical Exam  Constitutional: He is oriented to person, place, and time and well-developed, well-nourished, and in no distress.  HENT:  Head: Normocephalic and atraumatic.  Poor dentition  Cardiovascular: Normal rate and regular rhythm.   Pulmonary/Chest: Effort normal.  Musculoskeletal:  Normal grip strength 5/5, intact radial pulses.  No apparent wasting of thenar or hypothenar emminence. Right BKA.  Neurological: He is alert and oriented to person, place, and time.  Skin: Skin is warm and dry.     Assessment & Plan:  See encounters tab for problem based medical decision making. Patient discussed with Dr. Daryll Drown  No problem-specific assessment & plan notes found for this encounter.

## 2015-07-07 NOTE — Patient Instructions (Signed)
Thank you for coming to see me today. It was a pleasure. Today we talked about:   Peripheral neuropathy: i think your symptoms are due to worsening of your diabetic neuropathy but want to check other labs and refer you to neurology.  High blood pressure: I am starting a new medication called Lisinopril  Diabetes: we will recheck your A1c.  Please follow-up with [place] in  2 weeks  If you have any questions or concerns, please do not hesitate to call the office at [phone].  Take Care,   Jule Ser, DO

## 2015-07-08 LAB — BMP8+ANION GAP
Anion Gap: 16 mmol/L (ref 10.0–18.0)
BUN / CREAT RATIO: 22 — AB (ref 9–20)
BUN: 35 mg/dL — ABNORMAL HIGH (ref 6–24)
CHLORIDE: 92 mmol/L — AB (ref 96–106)
CO2: 24 mmol/L (ref 18–29)
Calcium: 9.7 mg/dL (ref 8.7–10.2)
Creatinine, Ser: 1.59 mg/dL — ABNORMAL HIGH (ref 0.76–1.27)
GFR calc Af Amer: 56 mL/min/{1.73_m2} — ABNORMAL LOW (ref >59)
GFR calc non Af Amer: 48 mL/min/{1.73_m2} — ABNORMAL LOW (ref >59)
GLUCOSE: 287 mg/dL — AB (ref 65–99)
Potassium: 4.4 mmol/L (ref 3.5–5.2)
SODIUM: 132 mmol/L — AB (ref 134–144)

## 2015-07-08 LAB — TSH: TSH: 1.07 u[IU]/mL (ref 0.450–4.500)

## 2015-07-08 LAB — HIV ANTIBODY (ROUTINE TESTING W REFLEX): HIV Screen 4th Generation wRfx: NONREACTIVE

## 2015-07-08 LAB — VITAMIN B12: VITAMIN B 12: 282 pg/mL (ref 211–946)

## 2015-07-08 LAB — RPR: RPR Ser Ql: NONREACTIVE

## 2015-07-08 NOTE — Assessment & Plan Note (Signed)
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  He continues to take his gabapentin regularly to help control his neuropathy as best he can.  We discussed what I thought this was regarding his bilateral hand numbness and tingling and the importance of getting his DM under better control.  I also discussed with him the possibility of other causes of his symptoms.  P: - will refer to neurology for EMG to confirm axonal pathyophysiology - recheck A1c --> result returned > 14 indicating that his diabetes continues to be uncontrolled and has actually deteriorated - TSH normal - HIV negative - RPR negative - vitamin B12 normal - RTC 2 weeks regarding BP

## 2015-07-08 NOTE — Assessment & Plan Note (Signed)
BP Readings from Last 3 Encounters:  07/07/15 167/71  04/16/15 175/88  03/19/15 192/84   A: continues to be hypertensive on 2 class regimen of amlodipine and carvedilol.  P: - continue amlodipine 10mg  daily and carvedilol 6.25mg  BID - given his incredibly high urine microalbumin/creatine ratio from his long standing uncontrolled DM, I will add lisinopril 10mg  daily - BMET checked today to ensure stable kidney function at time of lisinopril initiation - will have patient return in 2 weeks for repeat BMET, urine microalbumin, and BP check.  If BP remains elevated and kidney function stable, would consider increasing to 20mg  daily

## 2015-07-14 ENCOUNTER — Encounter: Payer: Self-pay | Admitting: Internal Medicine

## 2015-07-14 NOTE — Progress Notes (Signed)
Internal Medicine Clinic Attending  Case discussed with Dr. Wallace at the time of the visit.  We reviewed the resident's history and exam and pertinent patient test results.  I agree with the assessment, diagnosis, and plan of care documented in the resident's note.  

## 2015-07-23 ENCOUNTER — Ambulatory Visit (INDEPENDENT_AMBULATORY_CARE_PROVIDER_SITE_OTHER): Payer: Commercial Managed Care - HMO | Admitting: Internal Medicine

## 2015-07-23 ENCOUNTER — Encounter: Payer: Self-pay | Admitting: Internal Medicine

## 2015-07-23 VITALS — BP 176/89 | HR 92 | Temp 98.0°F | Wt 198.9 lb

## 2015-07-23 DIAGNOSIS — E1142 Type 2 diabetes mellitus with diabetic polyneuropathy: Secondary | ICD-10-CM

## 2015-07-23 DIAGNOSIS — I998 Other disorder of circulatory system: Secondary | ICD-10-CM

## 2015-07-23 DIAGNOSIS — G6289 Other specified polyneuropathies: Secondary | ICD-10-CM | POA: Diagnosis not present

## 2015-07-23 DIAGNOSIS — I5030 Unspecified diastolic (congestive) heart failure: Secondary | ICD-10-CM

## 2015-07-23 DIAGNOSIS — I10 Essential (primary) hypertension: Secondary | ICD-10-CM | POA: Diagnosis not present

## 2015-07-23 DIAGNOSIS — I70229 Atherosclerosis of native arteries of extremities with rest pain, unspecified extremity: Secondary | ICD-10-CM

## 2015-07-23 MED ORDER — TRAMADOL HCL 50 MG PO TABS: 50.0000 mg | ORAL_TABLET | Freq: Two times a day (BID) | ORAL | Status: DC | PRN

## 2015-07-23 NOTE — Patient Instructions (Signed)
You can take tramadol 50mg  twice a day for pain.   Start taking gabapentin 400mg  three times a day.

## 2015-07-24 LAB — BMP8+ANION GAP
ANION GAP: 16 mmol/L (ref 10.0–18.0)
BUN/Creatinine Ratio: 15 (ref 9–20)
BUN: 22 mg/dL (ref 6–24)
CALCIUM: 9.2 mg/dL (ref 8.7–10.2)
CHLORIDE: 98 mmol/L (ref 96–106)
CO2: 24 mmol/L (ref 18–29)
Creatinine, Ser: 1.42 mg/dL — ABNORMAL HIGH (ref 0.76–1.27)
GFR calc non Af Amer: 55 mL/min/{1.73_m2} — ABNORMAL LOW (ref >59)
GFR, EST AFRICAN AMERICAN: 64 mL/min/{1.73_m2} (ref >59)
GLUCOSE: 152 mg/dL — AB (ref 65–99)
POTASSIUM: 4.7 mmol/L (ref 3.5–5.2)
Sodium: 138 mmol/L (ref 134–144)

## 2015-07-24 MED ORDER — INSULIN GLARGINE 100 UNIT/ML SOLOSTAR PEN: 72.0000 [IU] | PEN_INJECTOR | Freq: Every day | SUBCUTANEOUS | Status: DC

## 2015-07-24 MED ORDER — LISINOPRIL 20 MG PO TABS: 20.0000 mg | ORAL_TABLET | Freq: Every day | ORAL | Status: DC

## 2015-07-24 MED ORDER — INSULIN ASPART 100 UNIT/ML FLEXPEN: 11.0000 [IU] | PEN_INJECTOR | Freq: Three times a day (TID) | SUBCUTANEOUS | Status: DC

## 2015-07-24 MED FILL — traMADol HCL 50 MG TABS: 50 | 30 days supply | Qty: 60 | Fill #0

## 2015-07-24 NOTE — Assessment & Plan Note (Addendum)
Pt has hx of LE angioplasty and has been lost to follow up. He will need repeat LE duplex and ABIs. His b/l hand pain is concerning for UE claudication as pain wakes him up at night and he will have to shake his hands to improve pain. He also sleeps with arms propped up so that his hands are hanging down. Hand pain worsens the more he uses it. He is a current smoker.   - referred to vascular sx - advised to stop smoking

## 2015-07-24 NOTE — Addendum Note (Signed)
Addended by: Norman Herrlich on: 07/24/2015 07:11 PM   Modules accepted: Orders, SmartSet

## 2015-07-24 NOTE — Assessment & Plan Note (Signed)
Pt complaining of worsening b/l hand pain to that point he has difficulty using insulin. He is requesting insulin pens due to this. He has chronic burning neuropathy pain 2/2 uncontrolled diabetes but now states his hand pain feels different from neuropathy pain in that they are cold to the touch. Hand pain is symmetrical b/l and wakes him up from sleep, pain worsens with increased use. He has an appt w/ neuro on 8/22 for EMG study. He has hx of PVD s/p angioplasty and amputation. He has not been compliant with gabapentin 400mg  TID, I called his pharmacy and he last picked up rx for gabapentin 68 days ago, despite pt's report of taking gabapentin as directed.   - rx for tramadol 50mg  BID prn for hand pain to get him to his neuro appt - referral to [facility] as above - counseled on better glycemic control - changed insulin rx to pen form - SW referral for RN to help him with ADL due to hand pain

## 2015-07-24 NOTE — Progress Notes (Signed)
Subjective:   Patient ID: [name] male   DOB: 10-30-1960 55 y.o.   MRN: [id number]  HPI: [name] is a 55 y.o. with past medical history as outlined below who presents to clinic for b/l hand pain. Previously seen in clinic on 7/3 for this same problem during which time his hgba1c was >14, RPR and HIV negative, and TSH and B12 wnl. He was advised on better glycemic control and referred to neurology for EMG study.   Please see problem list for status of the pt's chronic medical problems.  Past Medical History  Diagnosis Date  . Hyperlipidemia   . Hypertension   . Osteomyelitis ([place]) 2010    left foot, s/p midfoot amputation  . Osteomyelitis of ankle or foot ([place]) 05/2011    rt foot, s/p 5th ray amputation  . Neuromuscular disorder ([place])     diabetic neruopathy  . PAD (peripheral artery disease) ([place])     ABIs 11/30/11: L ABI 0.68, R ABI 0.84  . Pneumonia 2010  . Critical lower limb ischemia, lt with ABI of 0.60 12/31/2011  . PVD (peripheral vascular disease) ([place]) 12/31/2011  . S/P angioplasty with stent, 12/30/11, of Lt SFA, Post. tibialis and PTA of L. ant and post. tibial vessels 12/31/2011  . Type II diabetes mellitus ([place]) ~ 2002  . GERD (gastroesophageal reflux disease)   . CHF (congestive heart failure) ([place])   . Gangrene ([place])     right foot  . Osteomyelitis ([place]) 09/2013    RT BKA   Current Outpatient Prescriptions  Medication Sig Dispense Refill  . amLODipine (NORVASC) 10 MG tablet Take 1 tablet (10 mg total) by mouth daily. 30 tablet 1  . aspirin 325 MG EC tablet Take 1 tablet (325 mg total) by mouth daily. 30 tablet 11  . blood glucose meter kit and supplies KIT Dispense based on patient and insurance preference. Use up to four times daily as directed. (FOR ICD-9 250.00, 250.01). 1 each 0  . carvedilol (COREG) 6.25 MG tablet Take 1 tablet (6.25 mg total) by mouth 2 (two) times daily with a meal. 60 tablet 3  . esomeprazole (NEXIUM) 40 MG capsule Take 1  capsule (40 mg total) by mouth daily. 30 capsule 6  . gabapentin (NEURONTIN) 400 MG capsule Take 1 capsule (400 mg total) by mouth 3 (three) times daily. 90 capsule 2  . insulin aspart (NOVOLOG) 100 UNIT/ML injection Inject 11-27 Units into the skin 3 (three) times daily before meals. 10 mL 11  . insulin glargine (LANTUS) 100 UNIT/ML injection Inject 0.72 mLs (72 Units total) into the skin at bedtime. 40 mL 11  . lisinopril (PRINIVIL,ZESTRIL) 10 MG tablet Take 1 tablet (10 mg total) by mouth daily. 30 tablet 1  . metFORMIN (GLUCOPHAGE) 1000 MG tablet Take 1 tablet (1,000 mg total) by mouth 2 (two) times daily with a meal. 60 tablet 11  . polyethylene glycol (MIRALAX / GLYCOLAX) packet Take 17 g by mouth 2 (two) times daily. 100 each 6  . rosuvastatin (CRESTOR) 5 MG tablet Take 1 tablet (5 mg total) by mouth at bedtime. 30 tablet 11  . traMADol (ULTRAM) 50 MG tablet Take 1 tablet (50 mg total) by mouth 2 (two) times daily as needed. 60 tablet 0   No current facility-administered medications for this visit.   Family History  Problem Relation Age of Onset  . Diabetes Mother   . Hypertension Brother   . Hypertension Sister   . Anesthesia problems Neg  Hx    Social History   Social History  . Marital Status: Single    Spouse Name: N/A  . Number of Children: N/A  . Years of Education: 11th   Occupational History  .  [employer]   Social History Main Topics  . Smoking status: Current Some Day Smoker -- 0.25 packs/day for 24 years    Types: Cigarettes    Last Attempt to Quit: 04/15/2005  . Smokeless tobacco: Never Used     Comment: STARTED BACK SMOKING 2-017  . Alcohol Use: 0.0 oz/week    0 Standard drinks or equivalent per week     Comment: "rare- mixed drinks every 5 months"  . Drug Use: No  . Sexual Activity:    Partners: Female    Patent examiner Protection: Condom     Comment: one partner   Other Topics Concern  . 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, makes chair parts)   Graduated from [employer]; No further school because he had a baby girl   He has 4 children  (17, 55 , 21, 11 as of 2013)   Review of Systems: MSK: b/l hand pain that is burning and stinging, feels that his hands are cool to the touch Endo: feels shaky and sweaty when his CBGs are in the 180's Pulm: still smokes when he gets angry, denies SOB  Objective:  Physical Exam: Filed Vitals:   07/23/15 1438  BP: 176/89  Pulse: 92  Temp: 98 F (36.7 C)  TempSrc: Oral  Weight: 198 lb 14.4 oz (90.22 kg)  SpO2: 100%   Physical Exam  Constitutional: He appears well-developed and well-nourished. No distress.  HENT:  Head: Normocephalic and atraumatic.  Cardiovascular: Normal rate, regular rhythm and normal heart sounds.  Exam reveals no gallop and no friction rub.   No murmur heard. Pulmonary/Chest: Effort normal and breath sounds normal. No respiratory distress. He has no wheezes. He has no rales.  Abdominal: Soft. Bowel sounds are normal. He exhibits no distension and no mass. There is no tenderness. There is no rebound and no guarding.  Musculoskeletal:  Decreased hand grip closer, intact upper extremity strength. 3+ b/l radial pulses.  Skin: Skin is warm and dry. No rash noted. He is not diaphoretic. No erythema. No pallor.    Assessment & Plan:   Discussed with Dr. Evette Doffing. Please see problem based assessment and plan.

## 2015-07-24 NOTE — Assessment & Plan Note (Signed)
BP Readings from Last 3 Encounters:  07/23/15 176/89  07/07/15 167/71  04/16/15 175/88    Lab Results  Component Value Date   NA 138 07/23/2015   K 4.7 07/23/2015   CREATININE 1.42* 07/23/2015    Assessment: Blood pressure control:  uncontrolled Progress toward BP goal:   not at goal Comments: recently started on lisinopril 10mg  2 weeks ago in addition to coreg and norvasc.   Plan: Medications:  Increased lisinopril to 20mg  Educational resources provided:   Self management tools provided:   Other plans: repeat BMET reveals stable creatinine and potassium. Called pt today at 4:45pm (7/20) and instructed him to take lisinopril 20mg . F/u in 1 month

## 2015-07-25 ENCOUNTER — Telehealth: Payer: Self-pay | Admitting: Licensed Clinical Social Worker

## 2015-07-25 DIAGNOSIS — E1142 Type 2 diabetes mellitus with diabetic polyneuropathy: Secondary | ICD-10-CM

## 2015-07-25 NOTE — Telephone Encounter (Signed)
CSW placed call to [name] to f/u on [facility] RN referral for ADL assistance.  This worker provided supportive listening as pt expressed his concerns regarding having a decrease in his level of independence with ADL's due to hand pain.  [name] aware of current referrals in place and purpose of these referrals.  CSW discussed role of [place] RN vs Personal Care/Aid services.  Pt declines need for [facility] RN at this time.  Pt declines resources for private pay daily private duty services.  [name] would like to explore the benefit of HH PT/OT/HHA to educate on potential adaptive equipment available and ways to conserve energy while performing ADL's.  Pt aware [place] agency will provide assessment and develop plan of care.  [name] aware HHA services generally are not on a daily basis.  Pt is checking with daughter regarding agency of preference.  Pt notified request will be sent to Dr. Hulen Luster.

## 2015-07-25 NOTE — Telephone Encounter (Signed)
Please let me know if I need to put an order for this. Thanks. Dr. Hulen Luster

## 2015-07-25 NOTE — Progress Notes (Signed)
[facility] Clinic Attending  Case discussed with Dr. Truong at the time of the visit.  We reviewed the resident's history and exam and pertinent patient test results.  I agree with the assessment, diagnosis, and plan of care documented in the resident's note.  

## 2015-07-26 IMAGING — CR DG CHEST 1V PORT
1 series · 1 of 1 positions shown · non-contrast
Comparison: 09/07/2013

CLINICAL DATA: Central line inserted, check position

EXAM:
PORTABLE CHEST - 1 VIEW

[portable]
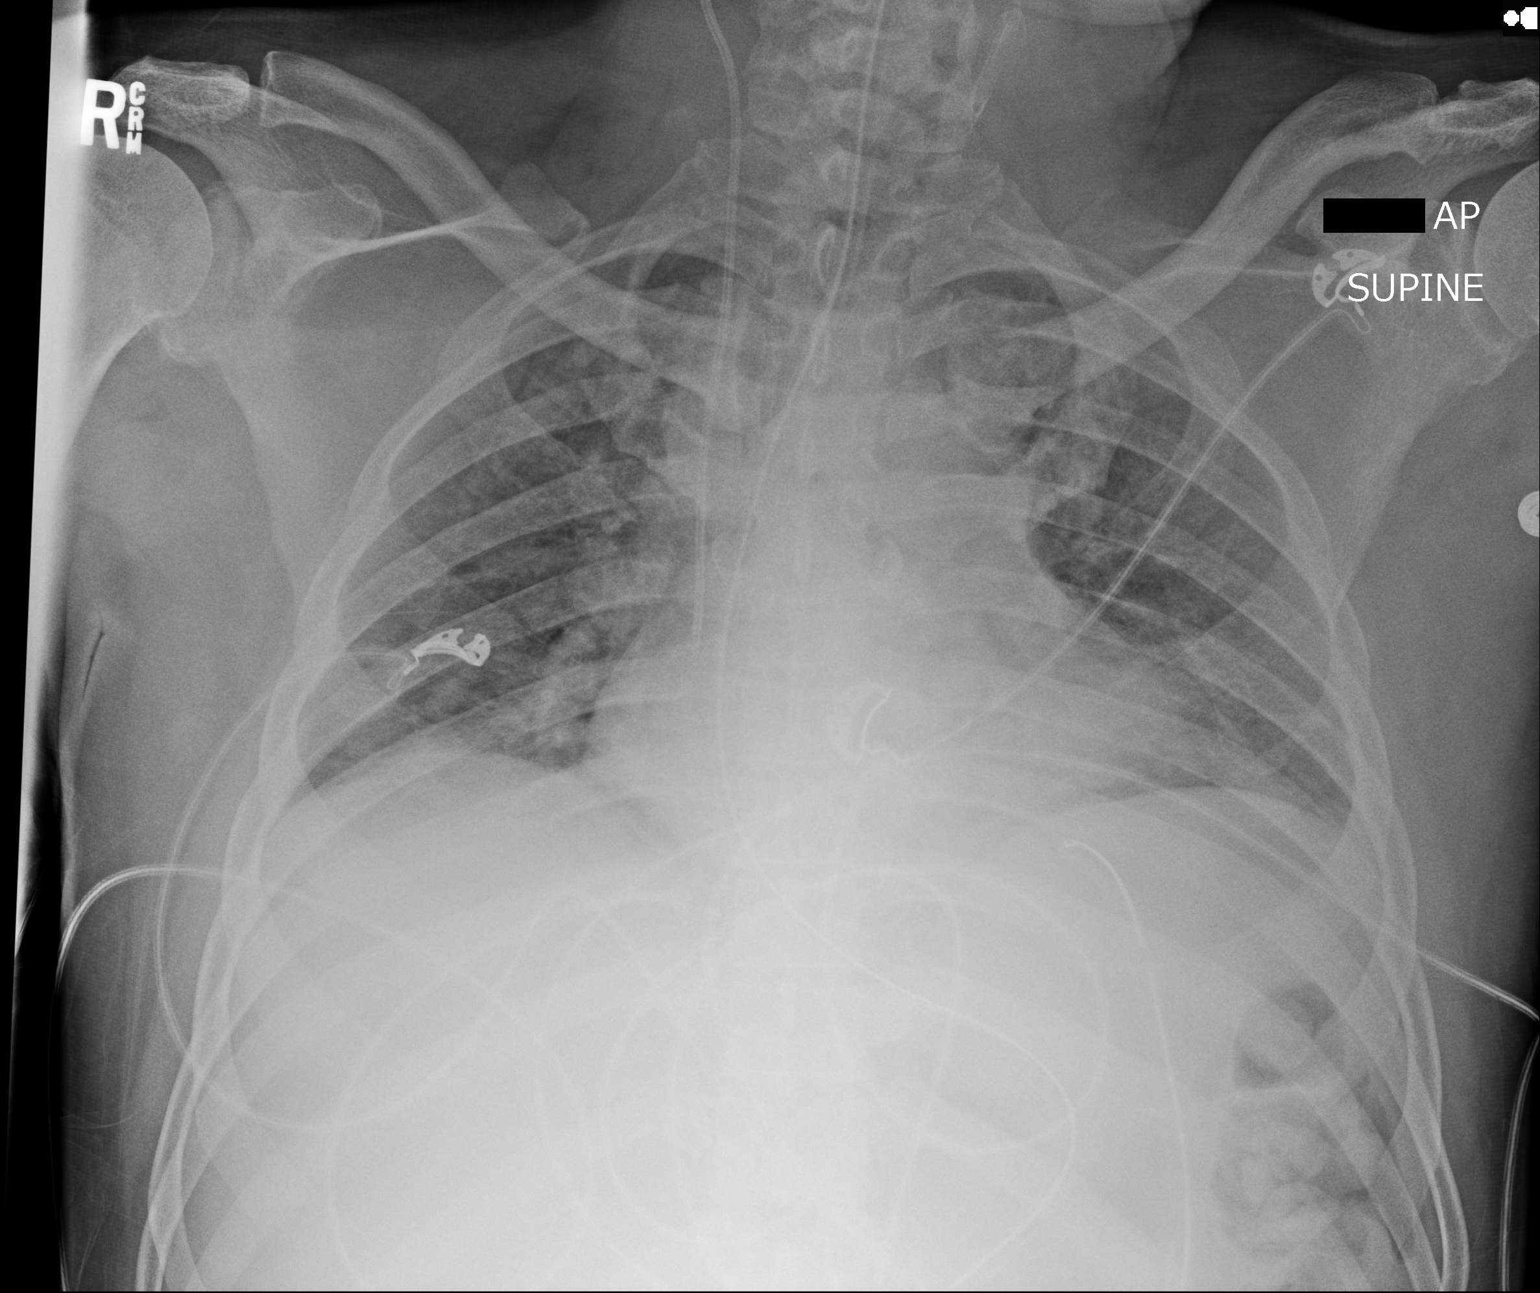

[1 of 1 positions shown; findings below may reference images not displayed]

FINDINGS: Limited inspiratory effect. Enlargement of cardiac silhouette. NG
tube extends into the stomach. Right internal jugular central line
has been placed with tip to the cavoatrial junction. No
pneumothorax. Bilateral vascular congestion.
IMPRESSION: Central line as described with no pneumothorax.

## 2015-07-26 IMAGING — CR DG ABD PORTABLE 1V
2 series · 2 of 2 positions shown · non-contrast
Comparison: 09/10/2013

ADDENDUM:
These results will be called to the ordering clinician or
representative by the Radiologist Assistant, and communication
documented in the PACS or zVision Dashboard.
CLINICAL DATA: Abdominal distension.

EXAM:
PORTABLE ABDOMEN - 1 VIEW

[AP (1 of 2)]
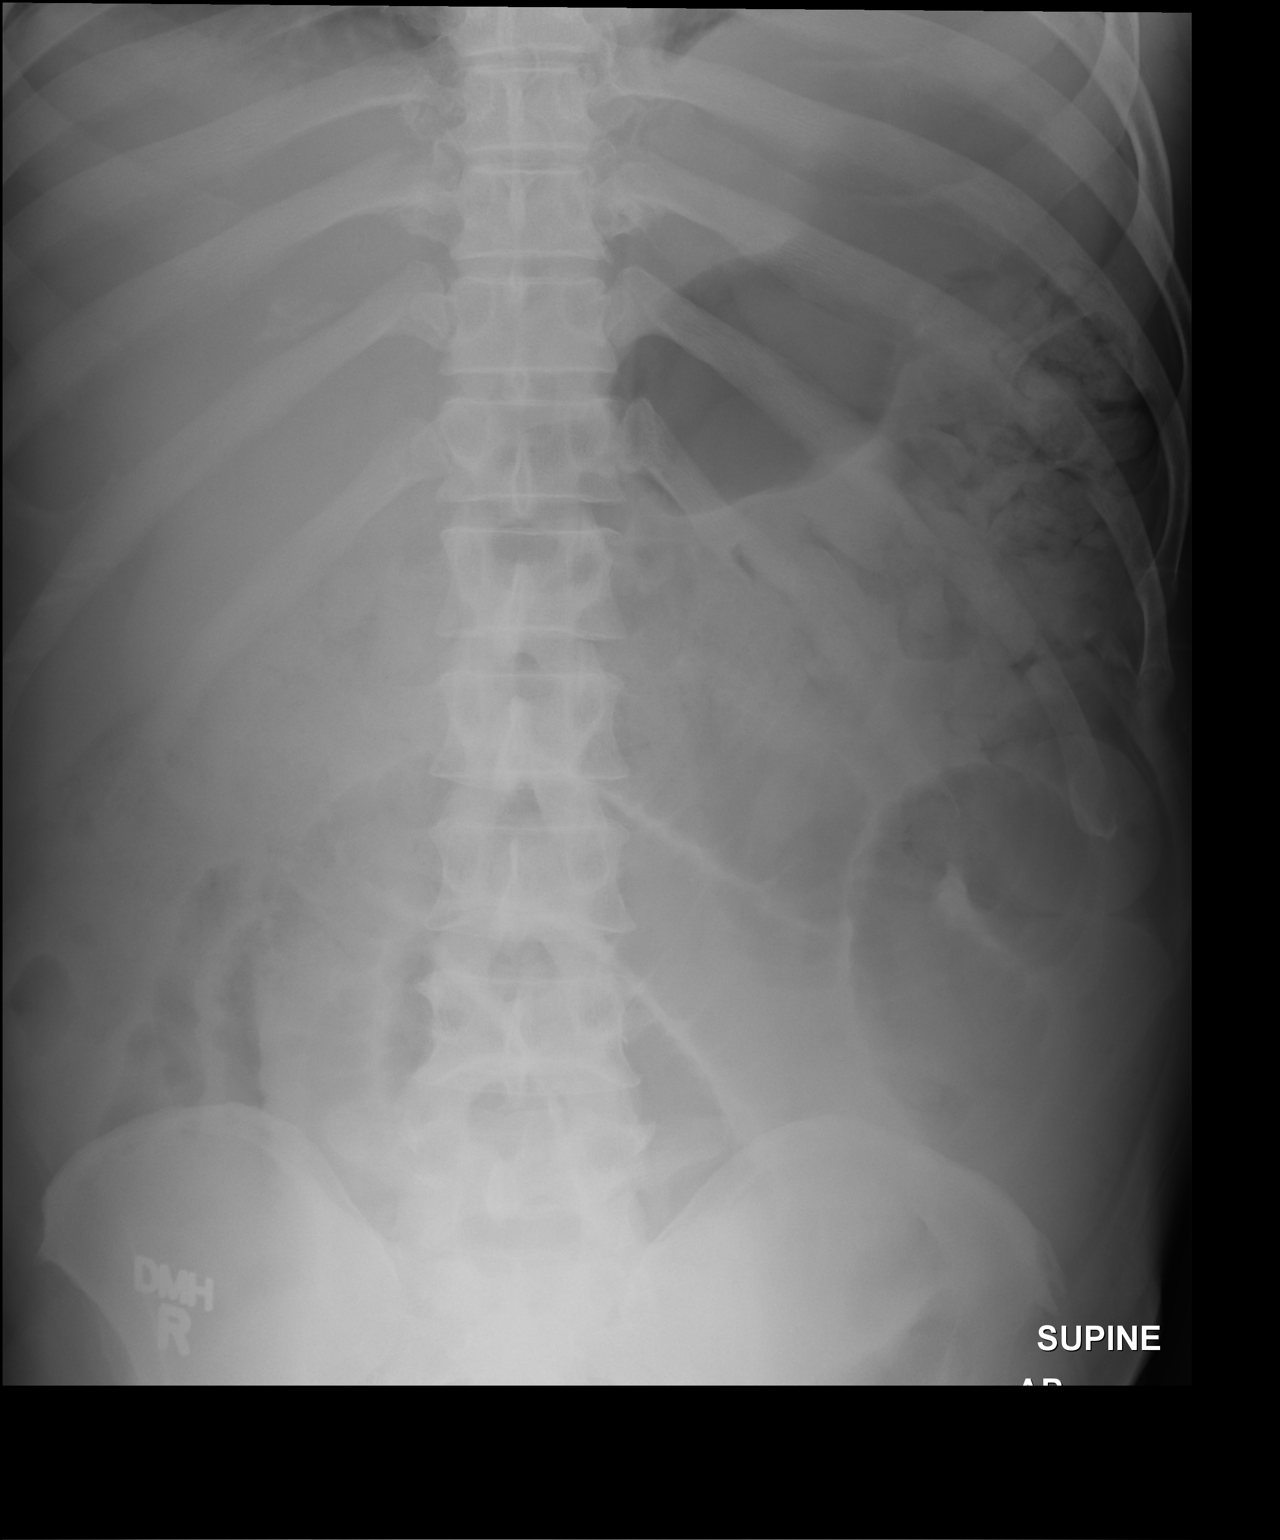

[AP (2 of 2)]
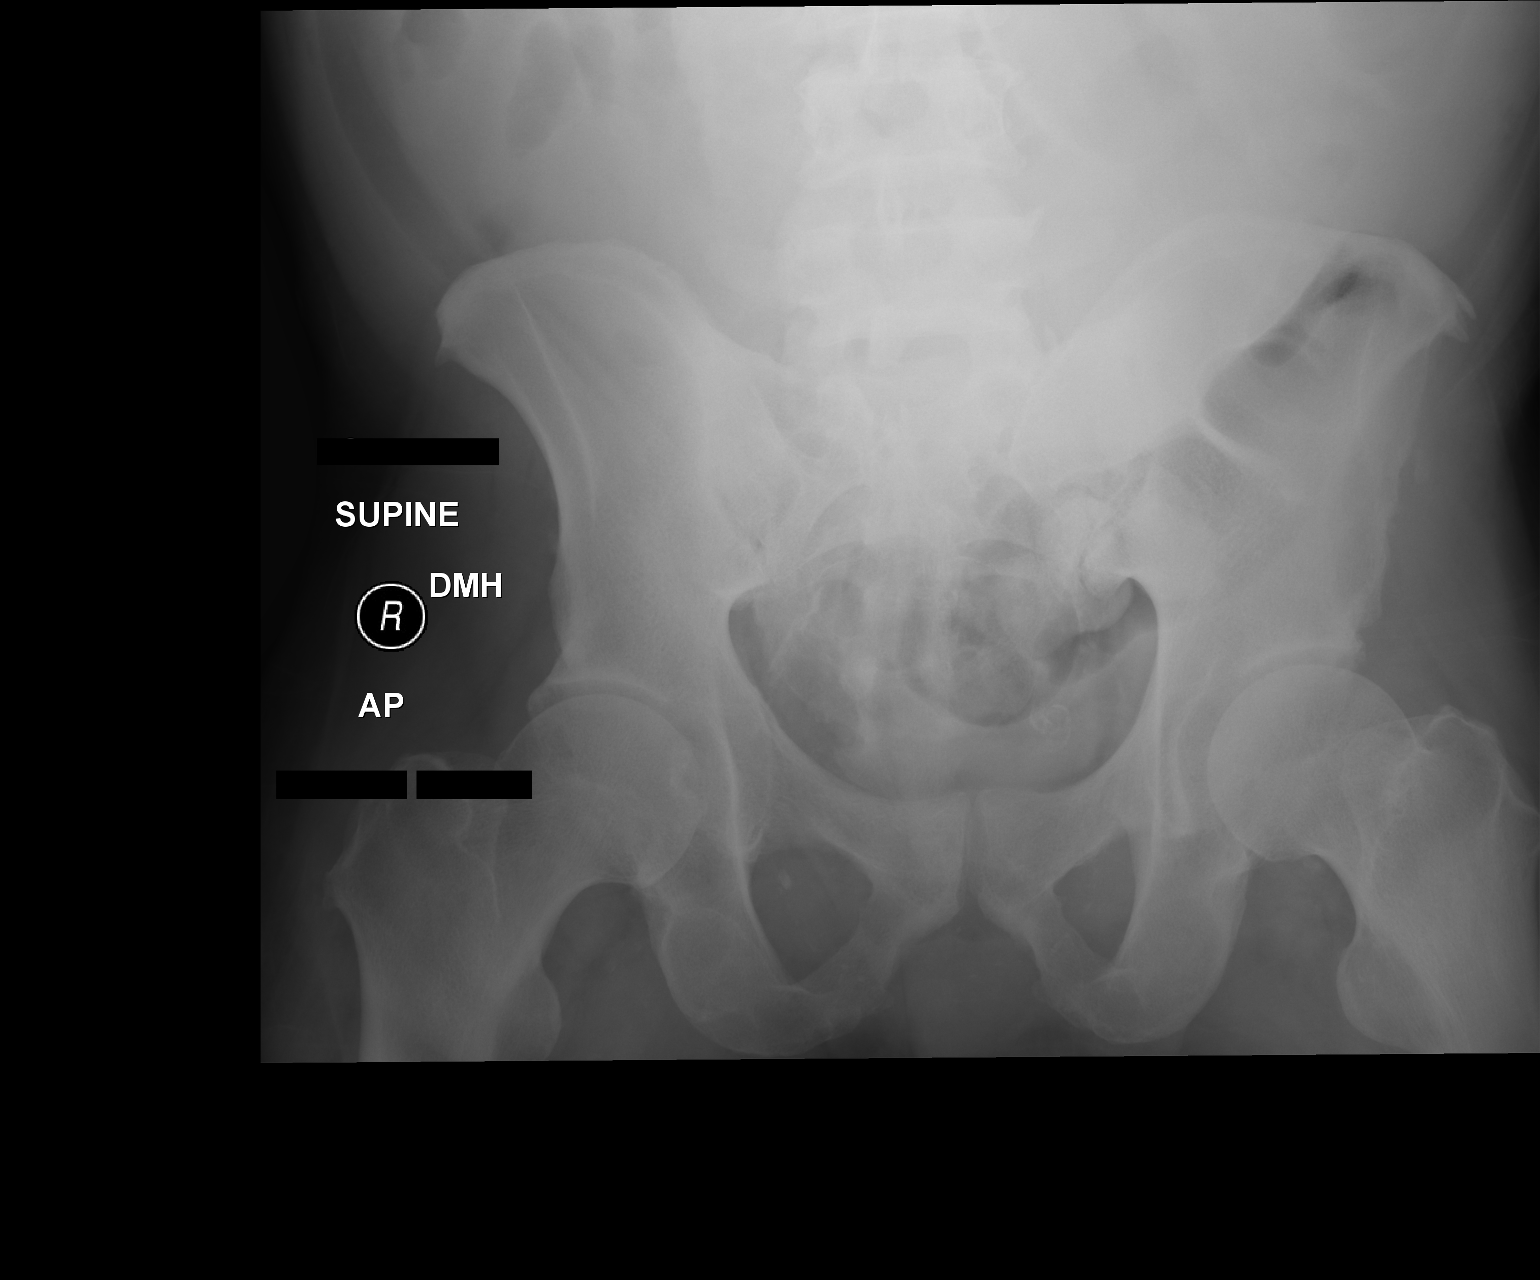

[2 of 2 positions shown; findings below may reference images not displayed]

FINDINGS: Stool is again seen throughout the colon. Gas is present in the
sigmoid colon and rectum. Multiple loops of gas-filled, dilated
small bowel are present in the central abdomen measuring up to
cm in diameter, increased from the prior study. No gross
intraperitoneal free air on the supine images.
IMPRESSION: Increasing gaseous small bowel dilatation with persistent stool
throughout the colon and distal colonic gas. Findings are suggestive
of worsening postoperative ileus, although a developing small bowel
obstruction is not excluded.

## 2015-07-28 NOTE — Addendum Note (Signed)
Addended by: Hulan Fray on: 07/28/2015 04:58 PM   Modules accepted: Orders

## 2015-07-29 IMAGING — CR DG ABD PORTABLE 1V
2 series · 2 of 2 positions shown · non-contrast
Comparison: 09/12/2013

CLINICAL DATA: Ileus

EXAM:
PORTABLE ABDOMEN - 1 VIEW

[AP (1 of 2)]
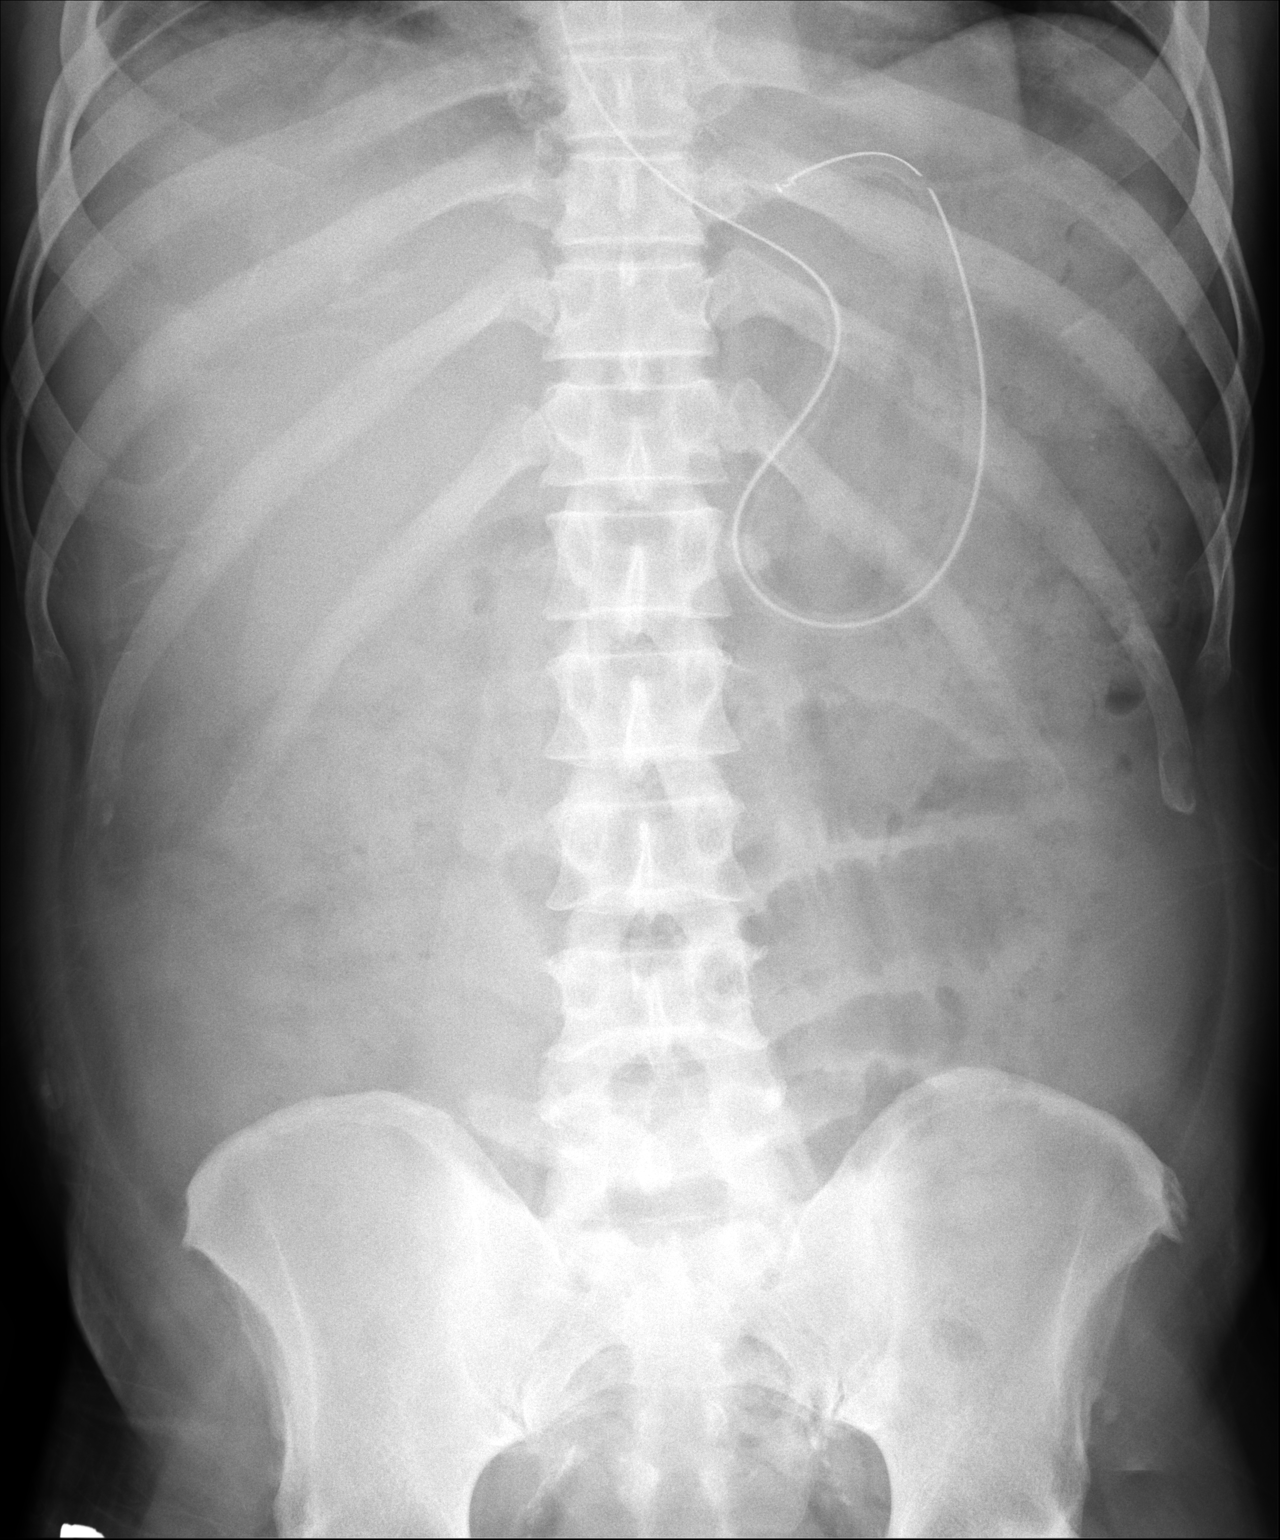

[AP (2 of 2)]
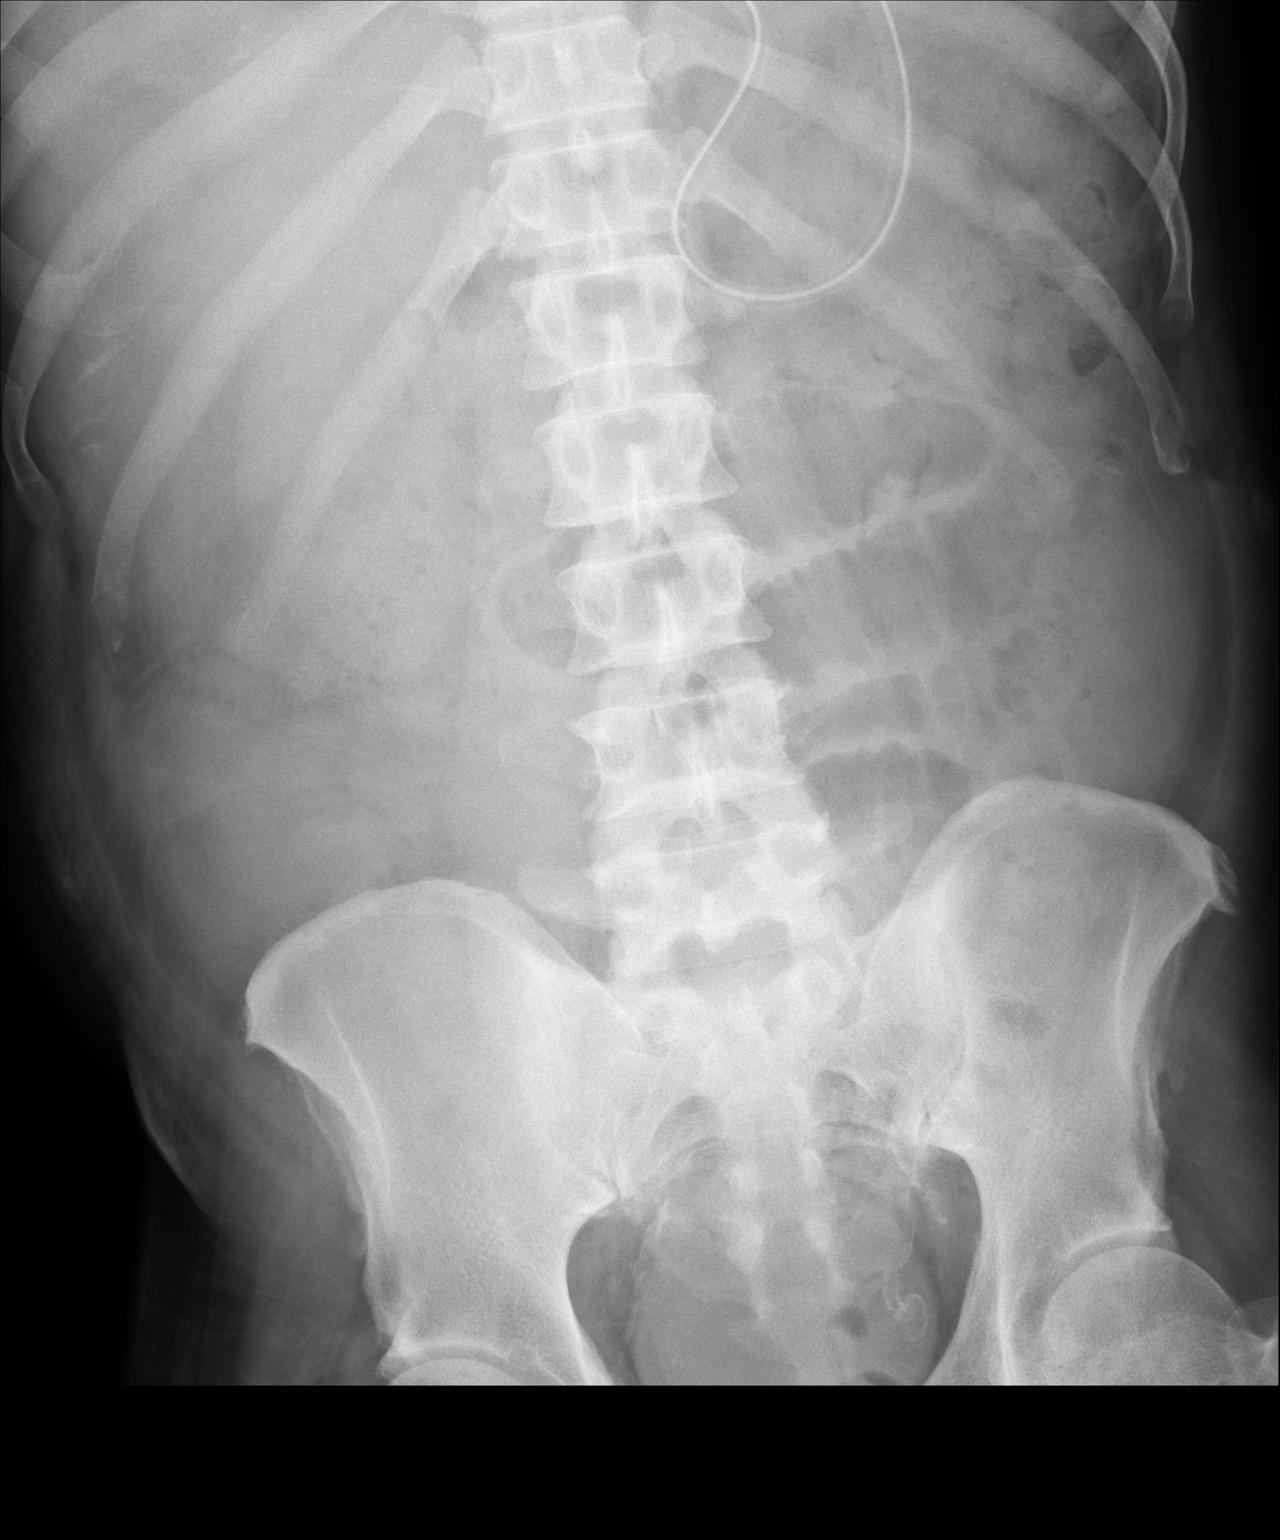

[2 of 2 positions shown; findings below may reference images not displayed]

FINDINGS: NG tube identified within the stomach. There is central abdominal
small bowel dilatation. Maximal diameter is 3.8 cm, decreased from
4.4 cm previously. There is little gas in the colon with a small
amount of air in the rectum. The bladder is mildly distended.
IMPRESSION: Improving small bowel dilatation. Minimal gas into the colon.
Improving small bowel ileus suspected, although low-grade partial
small bowel obstruction not excluded.

## 2015-08-01 MED FILL — LISINOPRIL 10 MG TABLET: 10 | 30 days supply | Qty: 30 | Fill #1

## 2015-08-01 MED FILL — ROSUVASTATIN CALCIUM 5 MG T: 5 | 30 days supply | Qty: 30 | Fill #1

## 2015-08-01 MED FILL — GABAPENTIN 400 MG CAPSULE: 400 | 30 days supply | Qty: 90 | Fill #1

## 2015-08-01 MED FILL — CARVEDILOL 6.25 MG TABLET: 6.25 | 30 days supply | Qty: 60 | Fill #1

## 2015-08-01 MED FILL — AMLODIPINE BESYLATE 10 MG T: 10 | 30 days supply | Qty: 30 | Fill #1

## 2015-08-01 MED FILL — ESOMEPRAZOLE MAG DR 40 MG C: 40 | 30 days supply | Qty: 30 | Fill #1

## 2015-08-08 ENCOUNTER — Other Ambulatory Visit: Payer: Self-pay | Admitting: Vascular Surgery

## 2015-08-08 DIAGNOSIS — I998 Other disorder of circulatory system: Secondary | ICD-10-CM

## 2015-08-26 ENCOUNTER — Encounter: Payer: Self-pay | Admitting: Neurology

## 2015-09-02 ENCOUNTER — Encounter: Payer: Self-pay | Admitting: Vascular Surgery

## 2015-09-26 ENCOUNTER — Encounter: Payer: Self-pay | Admitting: Vascular Surgery

## 2015-10-03 ENCOUNTER — Ambulatory Visit (HOSPITAL_COMMUNITY): Payer: Commercial Managed Care - HMO

## 2015-10-03 ENCOUNTER — Encounter: Payer: Commercial Managed Care - HMO | Admitting: Vascular Surgery

## 2015-10-31 ENCOUNTER — Encounter (HOSPITAL_COMMUNITY): Payer: Commercial Managed Care - HMO

## 2015-10-31 ENCOUNTER — Other Ambulatory Visit (HOSPITAL_COMMUNITY): Payer: Commercial Managed Care - HMO

## 2015-11-04 ENCOUNTER — Encounter: Payer: Commercial Managed Care - HMO | Admitting: Neurology

## 2015-11-06 ENCOUNTER — Encounter: Payer: Commercial Managed Care - HMO | Admitting: Vascular Surgery

## 2015-11-07 ENCOUNTER — Encounter: Payer: Commercial Managed Care - HMO | Admitting: Vascular Surgery

## 2015-11-14 ENCOUNTER — Encounter (HOSPITAL_COMMUNITY): Payer: Commercial Managed Care - HMO

## 2015-11-14 ENCOUNTER — Encounter: Payer: Commercial Managed Care - HMO | Admitting: Vascular Surgery

## 2015-11-14 ENCOUNTER — Other Ambulatory Visit (HOSPITAL_COMMUNITY): Payer: Commercial Managed Care - HMO

## 2015-11-24 ENCOUNTER — Other Ambulatory Visit: Payer: Self-pay

## 2015-11-24 ENCOUNTER — Other Ambulatory Visit: Payer: Self-pay | Admitting: Internal Medicine

## 2015-11-24 DIAGNOSIS — E1142 Type 2 diabetes mellitus with diabetic polyneuropathy: Secondary | ICD-10-CM

## 2015-11-24 DIAGNOSIS — I10 Essential (primary) hypertension: Secondary | ICD-10-CM

## 2015-11-24 MED FILL — ESOMEPRAZOLE MAG DR 40 MG C: 40 | 30 days supply | Qty: 30 | Fill #2

## 2015-11-24 MED FILL — ROSUVASTATIN CALCIUM 5 MG T: 5 | 30 days supply | Qty: 30 | Fill #2

## 2015-11-24 MED FILL — NovoLOG 100 UNIT/ML SOLN: 100 | 12 days supply | Qty: 10 | Fill #1

## 2015-11-24 MED FILL — LANTUS 100 UNITS/ML VIAL: 100 | 55 days supply | Qty: 40 | Fill #1

## 2015-11-24 MED FILL — metFORMIN HCL 1000 MG TABS: 1000 | 30 days supply | Qty: 60 | Fill #1

## 2015-11-24 MED FILL — AMLODIPINE BESYLATE 10 MG T: 10 | 30 days supply | Qty: 30 | Fill #0

## 2015-11-24 NOTE — Telephone Encounter (Signed)
Ms. Vannote was supposed to get this medication in anticipation of evaluation by vascular surgery and neurology.  Unclear reasons, but he has cancelled multiple appointments with both of these groups.  I will give a 1 time refill.  If he does not go to these appointments, however, he should not get further refills until we can assess reasons for cancellations.  He was also supposed to follow up with Dr. Hulen Luster earlier in the fall for BP assessment.  Can you please get him scheduled for an appointment likely in late Dec or January if Dr. Hulen Luster has availability.  Should not overlap with his vascular and neurology appointments, please.

## 2015-11-24 NOTE — Telephone Encounter (Signed)
Message sent to front office to schedule pt an appt. 

## 2015-11-24 NOTE — Telephone Encounter (Signed)
Requesting hydrocortisone and tramadol to be filled.

## 2015-11-25 MED FILL — traMADol HCL 50 MG TABS: 50 | 30 days supply | Qty: 60 | Fill #0

## 2015-11-25 NOTE — Telephone Encounter (Signed)
Tramadol rx called to [place]. Message sent to front office to schedule pt an appt.

## 2015-11-26 NOTE — Telephone Encounter (Signed)
Cream was prescribed by Dr. Jerline Pain advised patient to call Dr. Jerline Pain for refill

## 2015-11-26 NOTE — Telephone Encounter (Signed)
Calling back for a refill on hydrocortisone cream. Please call pt back.

## 2015-11-26 NOTE — Telephone Encounter (Signed)
Appt has been scheduled for 12/13 with Dr Adolph Pollack.

## 2015-12-10 ENCOUNTER — Encounter: Payer: Self-pay | Admitting: Vascular Surgery

## 2015-12-11 ENCOUNTER — Encounter (INDEPENDENT_AMBULATORY_CARE_PROVIDER_SITE_OTHER): Payer: Self-pay | Admitting: Diagnostic Neuroimaging

## 2015-12-11 ENCOUNTER — Ambulatory Visit (INDEPENDENT_AMBULATORY_CARE_PROVIDER_SITE_OTHER): Payer: Commercial Managed Care - HMO | Admitting: Diagnostic Neuroimaging

## 2015-12-11 DIAGNOSIS — R200 Anesthesia of skin: Secondary | ICD-10-CM | POA: Diagnosis not present

## 2015-12-11 DIAGNOSIS — Z0289 Encounter for other administrative examinations: Secondary | ICD-10-CM

## 2015-12-12 NOTE — Procedures (Signed)
[facility]  NCS (NERVE CONDUCTION STUDY) WITH EMG (ELECTROMYOGRAPHY) REPORT   STUDY DATE: 12/78/17 PATIENT NAME: [name] DOB: 1960-10-03 MRN: [id number]  ORDERING CLINICIAN: Jule Ser, DO  TECHNOLOGIST: Oneita Jolly ELECTROMYOGRAPHER: Earlean Polka. Clive Parcel, MD  CLINICAL INFORMATION: 55 year old male with bilateral hand numbness and weakness. Patient has atrophy of bilateral hand intrinsic muscles. Patient denies neck pain. Patient has sub-optimally controlled diabetes.   FINDINGS: NERVE CONDUCTION STUDY: Left median motor response has prolonged distal latency, normal amplitude, normal conduction velocity and normal F-wave latency.  Right median motor responses prolonged distal latency, normal amplitude, normal conduction velocity and prolonged F-wave latency.  Bilateral ulnar motor responses could not be obtained.  Bilateral median, bilateral ulnar and right radial sensory responses could not be obtained.  Left radial sensory response has prolonged peak latency and decreased amplitude.   NEEDLE ELECTROMYOGRAPHY: Needle examination of right upper extremity shows abnormal spontaneous activity in first dorsal interosseous with rapid firing Holley phasic motor units on exertion. Right triceps and right flexor carpi radialis muscles have no abnormal spontaneous activity at rest and decreased motor unit recruitment on exertion. Right flexor carpi radialis motor units are large on exertion. Right triceps motor units are normal in size.   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     INTERPRETING PHYSICIAN:  Penni Bombard, MD Certified in Neurology, Neurophysiology and Neuroimaging  [facility] [address], [place], [place] [address] [phone]   [facility]    Nerve / Sites Rec. Site Peak Lat Ref. Amp.1-2  Ref. Distance    ms ms V V cm  L Radial - Anatomical snuff box (Forearm)     Forearm Wrist 6.46 ?2.90 4.4 ?15.0 10  R Radial - Anatomical snuff box (Forearm)     Forearm Wrist NR ?2.90 NR ?15.0 10  L Median - Orthodromic (Dig II, Mid palm)     Dig II Wrist NR ?3.40 NR ?10.0 13     Mid palm Wrist NR ?2.30 NR ?35.0 8  R Median - Orthodromic (Dig II, Mid palm)     Dig II Wrist NR ?3.40 NR ?10.0 13     Mid palm Wrist NR ?2.30 NR ?35.0 8  L Ulnar - Orthodromic, (Dig V, Mid palm)     Dig V Wrist NR ?3.10 NR ?5.0 11     Mid palm Wrist NR ?2.30 NR ?11.0 8  R Ulnar - Orthodromic, (Dig V, Mid palm)     Dig V Wrist NR ?3.10 NR ?5.0 11     Mid palm Wrist NR ?2.30 NR ?11.0 8     [facility]    Nerve / Sites Muscle Latency Ref. Amplitude Ref. Rel Amp Segments Distance Lat Diff Velocity Ref. Area    ms ms mV mV %  cm ms m/s m/s mVms  L Median - APB     Wrist APB 5.6 ?4.4 5.8 ?4.0 100 Wrist - APB 7    25.2     Upper arm APB 11.4  6.1  105 Upper arm - Wrist 26 5.8 45    R Median - APB     Wrist APB 4.5 ?4.4 6.6 ?4.0 100 Wrist - APB 7    27.2     Upper arm APB 10.5  5.8  87.8 Upper arm - Wrist 26 6.0 43  24.0  L Ulnar - ADM     Wrist  ADM NR ?3.3 NR ?6.0 NR Wrist - ADM 7    NR     B.Elbow ADM NR  NR  NR B.Elbow - Wrist  NR  ?49 NR         A.Elbow - Wrist  NR     R Ulnar - ADM     Wrist ADM NR ?3.3 NR ?6.0 NR Wrist - ADM 7    NR     B.Elbow ADM NR  NR  NR B.Elbow - Wrist  NR  ?49 NR         A.Elbow - Wrist  NR        F  Wave    Nerve F Lat Ref.   ms ms  L Median - APB 35.7 ?31.0  R Median - APB 35.7 ?31.0     EMG full       EMG Summary Table    Spontaneous MUAP Recruitment  Muscle IA Fib PSW Fasc Other Amp Dur. Poly Pattern  R. Deltoid Normal None None None _______ Normal Normal Normal Normal  R. Biceps brachii Normal None None None _______ Normal Normal Normal Normal  R. Triceps brachii Increased None None None _______ Normal Normal Normal Reduced  R. Flexor carpi radialis Normal None  None None _______ Increased Normal Normal Reduced  R. First dorsal interosseous Normal 2+ 2+ None _______ Normal Normal 1+ Discrete

## 2015-12-16 ENCOUNTER — Telehealth: Payer: Self-pay | Admitting: Internal Medicine

## 2015-12-16 NOTE — Telephone Encounter (Signed)
APT. REMINDER CALL, LMTCB °

## 2015-12-17 ENCOUNTER — Encounter: Payer: Self-pay | Admitting: Internal Medicine

## 2015-12-17 ENCOUNTER — Encounter (HOSPITAL_COMMUNITY): Payer: Commercial Managed Care - HMO

## 2015-12-17 ENCOUNTER — Telehealth: Payer: Self-pay | Admitting: Dietician

## 2015-12-17 ENCOUNTER — Inpatient Hospital Stay (HOSPITAL_COMMUNITY): Admission: RE | Admit: 2015-12-17 | Payer: Commercial Managed Care - HMO | Source: Ambulatory Visit

## 2015-12-17 ENCOUNTER — Encounter: Payer: Commercial Managed Care - HMO | Admitting: Internal Medicine

## 2015-12-17 ENCOUNTER — Encounter: Payer: Commercial Managed Care - HMO | Admitting: Vascular Surgery

## 2015-12-17 NOTE — Telephone Encounter (Signed)
[facility] for diabetes medicine and supply refill history:  patient';s insulin pen prescriptions are on hold, he last got 1 vial or Novolog and 4 vials of lantus on November 21/2017. He does not have an active prescription for syringes or pen needles. To have them take the insulin pens off of hold, we need to cancel the vial prescriptions. Metformin was also filled for 30 day fill on 11/25/15.  No refill history on testing supplies.   I believe his [employer] preferred meter is a  one touch or Accu chek and if he uses one of these his copay is 0%.   Suggest annual referral for Diabetes self management training and MNT.

## 2015-12-17 NOTE — Addendum Note (Signed)
Addended by: Hulan Fray on: 12/17/2015 06:16 PM   Modules accepted: Orders

## 2015-12-18 ENCOUNTER — Encounter: Payer: Self-pay | Admitting: Internal Medicine

## 2015-12-18 ENCOUNTER — Encounter: Payer: Commercial Managed Care - HMO | Admitting: Diagnostic Neuroimaging

## 2015-12-19 ENCOUNTER — Other Ambulatory Visit: Payer: Self-pay | Admitting: Dietician

## 2015-12-19 ENCOUNTER — Encounter: Payer: Self-pay | Admitting: Dietician

## 2015-12-19 DIAGNOSIS — E1165 Type 2 diabetes mellitus with hyperglycemia: Secondary | ICD-10-CM

## 2015-12-19 DIAGNOSIS — E118 Type 2 diabetes mellitus with unspecified complications: Principal | ICD-10-CM

## 2015-12-19 MED ORDER — GLUCOSE BLOOD VI STRP: ORAL_STRIP | 12 refills | Status: DC

## 2015-12-19 MED ORDER — ACCU-CHEK AVIVA PLUS W/DEVICE KIT: PACK | 0 refills | Status: DC

## 2015-12-19 MED ORDER — ACCU-CHEK FASTCLIX LANCETS MISC: 12 refills | Status: DC

## 2015-12-19 MED ORDER — INSULIN PEN NEEDLE 31G X 5 MM MISC: 8 refills | Status: DC

## 2015-12-19 MED FILL — ACCU-CHEK AVIVA PLUS TEST S: 33 days supply | Qty: 100 | Fill #0

## 2015-12-19 MED FILL — ACCU-CHEK SOFTCLIX LANCETS: 33 days supply | Qty: 100 | Fill #0

## 2015-12-19 MED FILL — ACCU-CHEK AVIVA PLUS METER: W/DEVICE | 30 days supply | Qty: 1 | Fill #0

## 2015-12-19 MED FILL — UNIFINE PENTIPS 31GX3/16": 31G X 5 MM | 50 days supply | Qty: 200 | Fill #0

## 2015-12-19 MED FILL — UNIFINE PENTIPS 31GX3/16: 31G X 5 MM | 50 days supply | Qty: 200 | Fill #0

## 2015-12-19 NOTE — Telephone Encounter (Signed)
I called him and asked. He prefers the insulin pens and will need pen needles. The pharmacy says he can request the vials. I will let him know this.  He also needs a meter.  I put the pen needles and meter in for you to sign.

## 2015-12-19 NOTE — Telephone Encounter (Signed)
[name] - -   Other than put the order in, is there anything else specific I need to do?  Thanks!

## 2015-12-19 NOTE — Telephone Encounter (Signed)
Thank you :)

## 2015-12-22 MED FILL — GABAPENTIN 400 MG CAPSULE: 400 | 30 days supply | Qty: 90 | Fill #2

## 2016-02-16 ENCOUNTER — Emergency Department (HOSPITAL_COMMUNITY)
Admission: EM | Admit: 2016-02-16 | Discharge: 2016-02-16 | Disposition: A | Discharge status: Home / Self Care | Payer: Medicare HMO | Attending: Emergency Medicine | Admitting: Emergency Medicine

## 2016-02-16 ENCOUNTER — Encounter (HOSPITAL_COMMUNITY): Payer: Self-pay | Admitting: Emergency Medicine

## 2016-02-16 DIAGNOSIS — E119 Type 2 diabetes mellitus without complications: Secondary | ICD-10-CM | POA: Insufficient documentation

## 2016-02-16 DIAGNOSIS — R69 Illness, unspecified: Secondary | ICD-10-CM

## 2016-02-16 DIAGNOSIS — J111 Influenza due to unidentified influenza virus with other respiratory manifestations: Secondary | ICD-10-CM | POA: Diagnosis not present

## 2016-02-16 DIAGNOSIS — I509 Heart failure, unspecified: Secondary | ICD-10-CM | POA: Insufficient documentation

## 2016-02-16 DIAGNOSIS — J40 Bronchitis, not specified as acute or chronic: Secondary | ICD-10-CM | POA: Diagnosis not present

## 2016-02-16 DIAGNOSIS — Z794 Long term (current) use of insulin: Secondary | ICD-10-CM | POA: Insufficient documentation

## 2016-02-16 DIAGNOSIS — I110 Hypertensive heart disease with heart failure: Secondary | ICD-10-CM | POA: Diagnosis not present

## 2016-02-16 DIAGNOSIS — Z7982 Long term (current) use of aspirin: Secondary | ICD-10-CM | POA: Insufficient documentation

## 2016-02-16 DIAGNOSIS — F17210 Nicotine dependence, cigarettes, uncomplicated: Secondary | ICD-10-CM | POA: Insufficient documentation

## 2016-02-16 MED ORDER — OSELTAMIVIR PHOSPHATE 75 MG PO CAPS: 75.0000 mg | ORAL_CAPSULE | Freq: Two times a day (BID) | ORAL | 0 refills | Status: DC

## 2016-02-16 MED ORDER — ALBUTEROL SULFATE HFA 108 (90 BASE) MCG/ACT IN AERS
2.0000 | INHALATION_SPRAY | RESPIRATORY_TRACT | Status: DC
Start: 2016-02-16 — End: 2016-02-16
  Administered 2016-02-16: 2 via RESPIRATORY_TRACT
  Filled 2016-02-16: qty 6.7

## 2016-02-16 MED ORDER — AZITHROMYCIN 250 MG PO TABS: 250.0000 mg | ORAL_TABLET | Freq: Every day | ORAL | 0 refills | Status: DC

## 2016-02-16 MED ORDER — ONDANSETRON 8 MG PO TBDP: 8.0000 mg | ORAL_TABLET | Freq: Three times a day (TID) | ORAL | 0 refills | Status: DC | PRN

## 2016-02-16 MED FILL — ONDANSETRON ODT 8 MG TABLET: 8 | 4 days supply | Qty: 10 | Fill #0

## 2016-02-16 MED FILL — AZITHROMYCIN 250 MG TABLET: 250 | 5 days supply | Qty: 6 | Fill #0

## 2016-02-16 MED FILL — OSELTAMIVIR PHOS 75 MG CAP: 75 | 5 days supply | Qty: 10 | Fill #0

## 2016-02-16 NOTE — ED Triage Notes (Signed)
Pt sts fever and body aches with vomiting x 3 days
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
## 2016-04-08 MED FILL — CARVEDILOL 6.25 MG TABLET: 6.25 | 30 days supply | Qty: 60 | Fill #2

## 2016-04-08 MED FILL — AMLODIPINE BESYLATE 10 MG T: 10 | 30 days supply | Qty: 30 | Fill #1

## 2016-04-08 MED FILL — ROSUVASTATIN CALCIUM 5 MG T: 5 | 30 days supply | Qty: 30 | Fill #3

## 2016-07-28 ENCOUNTER — Encounter: Payer: Commercial Managed Care - HMO | Admitting: Internal Medicine

## 2016-08-04 ENCOUNTER — Encounter: Payer: Self-pay | Admitting: Internal Medicine

## 2016-08-04 ENCOUNTER — Ambulatory Visit (INDEPENDENT_AMBULATORY_CARE_PROVIDER_SITE_OTHER): Payer: Medicare HMO | Admitting: Internal Medicine

## 2016-08-04 VITALS — BP 177/78 | HR 84 | Temp 98.1°F | Ht 72.0 in | Wt 190.1 lb

## 2016-08-04 DIAGNOSIS — Z7982 Long term (current) use of aspirin: Secondary | ICD-10-CM | POA: Diagnosis not present

## 2016-08-04 DIAGNOSIS — E7800 Pure hypercholesterolemia, unspecified: Secondary | ICD-10-CM

## 2016-08-04 DIAGNOSIS — K219 Gastro-esophageal reflux disease without esophagitis: Secondary | ICD-10-CM

## 2016-08-04 DIAGNOSIS — Z794 Long term (current) use of insulin: Secondary | ICD-10-CM | POA: Diagnosis not present

## 2016-08-04 DIAGNOSIS — IMO0002 Reserved for concepts with insufficient information to code with codable children: Secondary | ICD-10-CM

## 2016-08-04 DIAGNOSIS — I998 Other disorder of circulatory system: Secondary | ICD-10-CM

## 2016-08-04 DIAGNOSIS — E785 Hyperlipidemia, unspecified: Secondary | ICD-10-CM | POA: Diagnosis not present

## 2016-08-04 DIAGNOSIS — I10 Essential (primary) hypertension: Secondary | ICD-10-CM

## 2016-08-04 DIAGNOSIS — I5189 Other ill-defined heart diseases: Secondary | ICD-10-CM

## 2016-08-04 DIAGNOSIS — E1165 Type 2 diabetes mellitus with hyperglycemia: Secondary | ICD-10-CM

## 2016-08-04 DIAGNOSIS — E118 Type 2 diabetes mellitus with unspecified complications: Principal | ICD-10-CM

## 2016-08-04 DIAGNOSIS — Z79899 Other long term (current) drug therapy: Secondary | ICD-10-CM

## 2016-08-04 DIAGNOSIS — Z89511 Acquired absence of right leg below knee: Secondary | ICD-10-CM | POA: Diagnosis not present

## 2016-08-04 DIAGNOSIS — E1142 Type 2 diabetes mellitus with diabetic polyneuropathy: Secondary | ICD-10-CM

## 2016-08-04 DIAGNOSIS — I70229 Atherosclerosis of native arteries of extremities with rest pain, unspecified extremity: Secondary | ICD-10-CM

## 2016-08-04 DIAGNOSIS — Z89422 Acquired absence of other left toe(s): Secondary | ICD-10-CM

## 2016-08-04 DIAGNOSIS — Z89412 Acquired absence of left great toe: Secondary | ICD-10-CM

## 2016-08-04 DIAGNOSIS — I5032 Chronic diastolic (congestive) heart failure: Secondary | ICD-10-CM

## 2016-08-04 DIAGNOSIS — E1151 Type 2 diabetes mellitus with diabetic peripheral angiopathy without gangrene: Secondary | ICD-10-CM | POA: Diagnosis not present

## 2016-08-04 DIAGNOSIS — I110 Hypertensive heart disease with heart failure: Secondary | ICD-10-CM

## 2016-08-04 DIAGNOSIS — K0889 Other specified disorders of teeth and supporting structures: Secondary | ICD-10-CM

## 2016-08-04 DIAGNOSIS — I739 Peripheral vascular disease, unspecified: Secondary | ICD-10-CM

## 2016-08-04 LAB — GLUCOSE, CAPILLARY: Glucose-Capillary: 377 mg/dL — ABNORMAL HIGH (ref 65–99)

## 2016-08-04 LAB — POCT GLYCOSYLATED HEMOGLOBIN (HGB A1C): Hemoglobin A1C: 12.3

## 2016-08-04 MED ORDER — AMLODIPINE BESYLATE 10 MG PO TABS: 10.0000 mg | ORAL_TABLET | Freq: Every day | ORAL | 3 refills | Status: DC

## 2016-08-04 MED ORDER — INSULIN GLARGINE 100 UNIT/ML SOLOSTAR PEN: 72.0000 [IU] | PEN_INJECTOR | Freq: Every day | SUBCUTANEOUS | 11 refills | Status: DC

## 2016-08-04 MED ORDER — ROSUVASTATIN CALCIUM 5 MG PO TABS: 5.0000 mg | ORAL_TABLET | Freq: Every day | ORAL | 3 refills | Status: DC

## 2016-08-04 MED ORDER — METFORMIN HCL 1000 MG PO TABS: 1000.0000 mg | ORAL_TABLET | Freq: Two times a day (BID) | ORAL | 3 refills | Status: DC

## 2016-08-04 MED ORDER — ESOMEPRAZOLE MAGNESIUM 40 MG PO CPDR: 40.0000 mg | DELAYED_RELEASE_CAPSULE | Freq: Every day | ORAL | 3 refills | Status: DC

## 2016-08-04 MED ORDER — LISINOPRIL 20 MG PO TABS: 20.0000 mg | ORAL_TABLET | Freq: Every day | ORAL | 3 refills | Status: DC

## 2016-08-04 MED ORDER — INSULIN ASPART 100 UNIT/ML FLEXPEN: 11.0000 [IU] | PEN_INJECTOR | Freq: Three times a day (TID) | SUBCUTANEOUS | 11 refills | Status: DC

## 2016-08-04 MED ORDER — CARVEDILOL 6.25 MG PO TABS: 6.2500 mg | ORAL_TABLET | Freq: Two times a day (BID) | ORAL | 3 refills | Status: DC

## 2016-08-04 MED ORDER — TRAMADOL HCL 50 MG PO TABS: 50.0000 mg | ORAL_TABLET | Freq: Two times a day (BID) | ORAL | 2 refills | Status: DC | PRN

## 2016-08-04 MED ORDER — GABAPENTIN 400 MG PO CAPS: 400.0000 mg | ORAL_CAPSULE | Freq: Three times a day (TID) | ORAL | 3 refills | Status: DC

## 2016-08-04 MED ORDER — CETAPHIL MOISTURIZING EX CREA: TOPICAL_CREAM | CUTANEOUS | 0 refills | Status: DC | PRN

## 2016-08-04 MED ORDER — INSULIN PEN NEEDLE 31G X 5 MM MISC: 8 refills | Status: DC

## 2016-08-04 MED FILL — metFORMIN HCL 1000 MG TABS: 1000 | 90 days supply | Qty: 180 | Fill #0

## 2016-08-04 MED FILL — NOVOLOG FLEXPEN SYRINGE: 100 | 45 days supply | Qty: 15 | Fill #0

## 2016-08-04 MED FILL — ROSUVASTATIN CALCIUM 5 MG T: 5 | 90 days supply | Qty: 90 | Fill #0

## 2016-08-04 MED FILL — UNIFINE PENTIPS 31GX3/16": 31G X 5 MM | 50 days supply | Qty: 200 | Fill #0

## 2016-08-04 MED FILL — LANTUS SOLOSTAR 100 UNITS/M: 100 | 20 days supply | Qty: 15 | Fill #0

## 2016-08-04 MED FILL — GABAPENTIN 400 MG CAPSULE: 400 | 90 days supply | Qty: 270 | Fill #0

## 2016-08-04 MED FILL — LISINOPRIL 20 MG TAB: 20 | 90 days supply | Qty: 90 | Fill #0

## 2016-08-04 MED FILL — CARVEDILOL 6.25 MG TABLET: 6.25 | 90 days supply | Qty: 180 | Fill #0

## 2016-08-04 MED FILL — ESOMEPRAZOLE MAG DR 40 MG C: 40 | 90 days supply | Qty: 90 | Fill #0

## 2016-08-04 MED FILL — AMLODIPINE BESYLATE 10 MG T: 10 | 90 days supply | Qty: 90 | Fill #0

## 2016-08-04 MED FILL — UNIFINE PENTIPS 31GX3/16: 31G X 5 MM | 50 days supply | Qty: 200 | Fill #0

## 2016-08-04 NOTE — Patient Instructions (Addendum)
Mr. Dowson - -  Please restart your medications as previously prescribed on the list on the next page.   For your dry skin - Please try daily cetaphil lotion.  I will send this to the pharmacy to see if they will fill it.  You can also do vaseline daily at night.   I would like you to go and see Vascular Surgery.  Please keep any appointment done with them.    Thank you!  Please come back to see me in 1 month.

## 2016-08-04 NOTE — Progress Notes (Signed)
   Subjective:    Patient ID: [name], male    DOB: 07-Jun-1960, 56 y.o.   MRN: [id number]  CC: 1 year follow up for PVD  HPI  [name] is a 56yo man with PMH of PVD, DM2 with neuropathy, HLD, HTN, HFpEF, GERD who presents after not being seen for 1 year.  At last visit in July of 2017 he was having bilateral hand pain concerning for neuropathy vs. UE claudication.  He had UE NCV tests which showed severe polyneuropathy.  He was due to see vascular surgery, but it does not appear that an appointment was ever made.  ABIs done today in clinic showed severe PVD on the left.    Today, [name] reports that he has been out of most of his medications for about 1 month.  He has only had lisinopril, aspirin and insulin aspart.  He has been spreading out his insulin to ensure that he has had something to take.  He denies any nausea, vomiting, blurry vision or urinary changes.  His Glucose on testing today was elevated and A1C is > 12.  [name] has concomitant neuropathy and is on gabapentin and tramadol.   He has had issues with bilateral hand numbness and he reports difficulty with proprioception and spatial awareness with moving his hands due to this issue.  He has been taking gabapentin with good results.  He had NCV of the hands which showed severe bilateral polyneuropathy, likely related to prolonged uncontrolled DM.    He has an acute complaint today of dry skin on the left foot.  He notes that "no matter what" he tries it stays dry.  He has tried vaseline with occlusion in the past and will have intermittent improvement, but no lasting effect.  He notes that he does not want to do something daily.    A bulk of our conversation had [name] expressing irritation with his previous PCP.  I redirected on multiple occasions and tried to smooth over issues he had in the past.    Review of Systems  Constitutional: Negative for activity change, chills and fatigue.  Eyes: Negative for photophobia and  discharge.  Respiratory: Negative for cough, choking and shortness of breath.   Cardiovascular: Negative for chest pain and leg swelling.  Gastrointestinal: Negative for constipation and diarrhea.  Musculoskeletal: Positive for arthralgias. Negative for joint swelling.  Skin: Negative for rash and wound.       Objective:   Physical Exam  Constitutional: He is oriented to person, place, and time. He appears well-developed and well-nourished. No distress.  HENT:  Head: Normocephalic and atraumatic.  Poor dentition  Cardiovascular: Normal rate, regular rhythm, normal heart sounds and intact distal pulses.   No murmur heard. Pulmonary/Chest: Effort normal and breath sounds normal. No respiratory distress.  Musculoskeletal: He exhibits no edema.  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   Neurological: He is alert and oriented to person, place, and time.  Skin:  Excessively dry skin on the left foot to the ankle.     CMET, CBC, MAU/Cr.      Assessment & Plan:  RTC in 1 month

## 2016-08-05 ENCOUNTER — Encounter: Payer: Self-pay | Admitting: Internal Medicine

## 2016-08-05 LAB — CMP14 + ANION GAP
ALK PHOS: 88 IU/L (ref 39–117)
ALT: 12 IU/L (ref 0–44)
ANION GAP: 15 mmol/L (ref 10.0–18.0)
AST: 12 IU/L (ref 0–40)
Albumin/Globulin Ratio: 1.3 (ref 1.2–2.2)
Albumin: 3.5 g/dL (ref 3.5–5.5)
BILIRUBIN TOTAL: 0.2 mg/dL (ref 0.0–1.2)
BUN/Creatinine Ratio: 14 (ref 9–20)
BUN: 21 mg/dL (ref 6–24)
CHLORIDE: 98 mmol/L (ref 96–106)
CO2: 24 mmol/L (ref 20–29)
CREATININE: 1.52 mg/dL — AB (ref 0.76–1.27)
Calcium: 8.7 mg/dL (ref 8.7–10.2)
GFR calc Af Amer: 58 mL/min/{1.73_m2} — ABNORMAL LOW (ref >59)
GFR calc non Af Amer: 50 mL/min/{1.73_m2} — ABNORMAL LOW (ref >59)
GLUCOSE: 368 mg/dL — AB (ref 65–99)
Globulin, Total: 2.7 g/dL (ref 1.5–4.5)
Potassium: 4.4 mmol/L (ref 3.5–5.2)
Sodium: 137 mmol/L (ref 134–144)
TOTAL PROTEIN: 6.2 g/dL (ref 6.0–8.5)

## 2016-08-05 LAB — CBC
HEMATOCRIT: 39 % (ref 37.5–51.0)
HEMOGLOBIN: 13 g/dL (ref 13.0–17.7)
MCH: 29.7 pg (ref 26.6–33.0)
MCHC: 33.3 g/dL (ref 31.5–35.7)
MCV: 89 fL (ref 79–97)
Platelets: 246 10*3/uL (ref 150–379)
RBC: 4.37 x10E6/uL (ref 4.14–5.80)
RDW: 13.3 % (ref 12.3–15.4)
WBC: 7.7 10*3/uL (ref 3.4–10.8)

## 2016-08-05 LAB — MICROALBUMIN / CREATININE URINE RATIO
Creatinine, Urine: 116.7 mg/dL
MICROALB/CREAT RATIO: 2748.3 mg/g{creat} — AB (ref 0.0–30.0)
MICROALBUM., U, RANDOM: 3207.3 ug/mL

## 2016-08-05 NOTE — Assessment & Plan Note (Addendum)
He has not been taking his glargine and only intermittently taking his aspart.  He did not realize he could get medicines shipped to [place] (where he has been living the last month).  His A1C is very high today.   Plan Restart insulin glargine 72 units at night, insulin aspart 11 units with meals.  Restart gabapentin and tramadol for neuropathy.  MAU/Cr today

## 2016-08-05 NOTE — Assessment & Plan Note (Addendum)
This issue is very concerning to me today.  I am worried about his left leg and the severe nature of his PVD.  He has been taking his aspirin.  He has no symptoms of claudication today.   I think his dry cracked skin is related to his poor circulation.  I advised him to use an emollient and lotion twice daily to help with this.    Plan Continue aspirin Better BP control Restart Crestor Discuss smoking at next visit Vascular Surgery referral.  Cetaphil emollient to skin BID.

## 2016-08-05 NOTE — Assessment & Plan Note (Signed)
Uncontrolled today, out of his medication.   Plan Refill nexium.

## 2016-08-05 NOTE — Assessment & Plan Note (Signed)
ABIs done in the clinic are consistent with previous values.  Our machine could not read a wave form in the foot.  I felt a very faint DP on exam.  I am concerned with the skin changes and the ABIs that he is at risk for critical limb ischemia.  I have sent a referral for vascular surgery and encouraged [name] to follow up.   Plan Continue aspirin Restart blood pressure medications Vascular Surgery Referral.

## 2016-08-05 NOTE — Assessment & Plan Note (Signed)
BP was very high today.  He reports he has been out of most of his medications, except lisinopril for over a month.  He has not taken many medications due to frustration with his previous PCP and feeling like his issues were not being heard.    Plan Restart Amlodipine, coreg.  Continue lisinopril CMET today.

## 2016-08-05 NOTE — Assessment & Plan Note (Signed)
TTE from 2015 on one exam shows grade 1 diastolic dysfunction and on a second does not show this finding.  It would not be out of the realm of possibility for him to have some LVH and dysfunction due to his long term HTN.  He has no signs/symptoms of CHF today.   Restart beta blocker.  He is on an ACE-I Continue aspirin.

## 2016-08-05 NOTE — Assessment & Plan Note (Signed)
I will restart Crestor today, he has DM and PVD as reasons to be on this medication.   Consider Lipid panel at next visit.   Check CMET today.

## 2016-08-05 NOTE — Assessment & Plan Note (Signed)
This is pretty severe.  He has improvement with gabapentin.   Plan Restart gabapentin.

## 2016-08-06 MED FILL — traMADol HCL 50 MG TABS: 50 | 30 days supply | Qty: 60 | Fill #0

## 2016-08-13 ENCOUNTER — Encounter: Payer: Medicare HMO | Admitting: Vascular Surgery

## 2016-08-17 ENCOUNTER — Encounter: Payer: Self-pay | Admitting: Vascular Surgery

## 2016-08-26 ENCOUNTER — Encounter: Payer: Medicare HMO | Admitting: Vascular Surgery

## 2016-10-07 ENCOUNTER — Encounter: Payer: Medicare HMO | Admitting: Vascular Surgery

## 2016-10-08 NOTE — Addendum Note (Signed)
Addended by: Hulan Fray on: 10/08/2016 01:42 PM   Modules accepted: Orders

## 2016-10-21 MED FILL — traMADol HCL 50 MG TABS: 50 | 30 days supply | Qty: 60 | Fill #1

## 2017-01-19 MED FILL — LANTUS SOLOSTAR 100 UNITS/M: 100 | 20 days supply | Qty: 15 | Fill #1

## 2017-01-19 MED FILL — ESOMEPRAZOLE MAG DR 40 MG C: 40 | 90 days supply | Qty: 90 | Fill #1

## 2017-01-19 MED FILL — traMADol HCL 50 MG TABS: 50 | 7 days supply | Qty: 14 | Fill #2

## 2017-02-04 MED FILL — GABAPENTIN 400 MG CAPSULE: 400 | 90 days supply | Qty: 270 | Fill #1

## 2017-02-04 MED FILL — traMADol HCL 50 MG TABS: 50 | 7 days supply | Qty: 14 | Fill #3

## 2017-02-18 ENCOUNTER — Encounter: Payer: Self-pay | Admitting: Internal Medicine

## 2017-02-18 DIAGNOSIS — E663 Overweight: Secondary | ICD-10-CM | POA: Insufficient documentation

## 2017-04-06 ENCOUNTER — Other Ambulatory Visit: Payer: Self-pay

## 2017-04-06 ENCOUNTER — Encounter: Payer: Self-pay | Admitting: Internal Medicine

## 2017-04-06 ENCOUNTER — Ambulatory Visit (INDEPENDENT_AMBULATORY_CARE_PROVIDER_SITE_OTHER): Payer: Medicare HMO | Admitting: Internal Medicine

## 2017-04-06 ENCOUNTER — Encounter (INDEPENDENT_AMBULATORY_CARE_PROVIDER_SITE_OTHER): Payer: Self-pay

## 2017-04-06 DIAGNOSIS — R300 Dysuria: Secondary | ICD-10-CM

## 2017-04-06 DIAGNOSIS — E1142 Type 2 diabetes mellitus with diabetic polyneuropathy: Secondary | ICD-10-CM

## 2017-04-06 DIAGNOSIS — Z79899 Other long term (current) drug therapy: Secondary | ICD-10-CM | POA: Diagnosis not present

## 2017-04-06 DIAGNOSIS — R319 Hematuria, unspecified: Secondary | ICD-10-CM | POA: Diagnosis not present

## 2017-04-06 DIAGNOSIS — Z89611 Acquired absence of right leg above knee: Secondary | ICD-10-CM | POA: Diagnosis not present

## 2017-04-06 DIAGNOSIS — R69 Illness, unspecified: Secondary | ICD-10-CM | POA: Diagnosis not present

## 2017-04-06 DIAGNOSIS — M79642 Pain in left hand: Secondary | ICD-10-CM

## 2017-04-06 DIAGNOSIS — G8929 Other chronic pain: Secondary | ICD-10-CM | POA: Diagnosis not present

## 2017-04-06 DIAGNOSIS — F17200 Nicotine dependence, unspecified, uncomplicated: Secondary | ICD-10-CM | POA: Diagnosis not present

## 2017-04-06 DIAGNOSIS — Z79891 Long term (current) use of opiate analgesic: Secondary | ICD-10-CM

## 2017-04-06 DIAGNOSIS — M79641 Pain in right hand: Secondary | ICD-10-CM

## 2017-04-06 NOTE — Assessment & Plan Note (Addendum)
Patient is here with complaint of intermittent gross hematuria for the past few weeks. He reports his urine stream is initially clear but turns red at the end of urination. He also notices that his urine occasionally has air bubbles in it. He endorses associated dysuria with hematuria. On chart review it appears that he has had intermittent hematuria on prior UAs (post small and large) getting back to 2010. He is a current smoker and at risk for GU malignancies. He was unable to provide a urine sample today for UA. Instructed patient to return at his convenience to provide sample. If suspicious for UTI, would treat and plan to repeat UA to ensure resolution of hematuria. If hematuria but no infection, plan for CT urography and urology referral for cystoscopy.  -- F/u for UA -- Further work up pending results

## 2017-04-06 NOTE — Assessment & Plan Note (Signed)
Patient is complaining of severe hand pain and requesting a stronger medication than tramadol. Per PCP notes he had NCV of the hands in 12/2015  which showed severe bilateral polyneuropathy, felt to be related to prolonged uncontrolled DM. He is prescribed gabapentin and tramadol for this. He feels they are both ineffective. Will defer escalation of his regimen to his PCP. Informed him he needs PCP follow up for this. Patient was very upset with this answer stating he would find a new PCP.

## 2017-04-06 NOTE — Progress Notes (Signed)
CC: hematuria  HPI:  [name] is a 57 y.o. male with past medical history outlined below here for hematuria. For the details of today's visit, please refer to the assessment and plan.  Patient is here mainly with complaint of intermittent hematuria. However has a myriad of other complaints he is requesting be addressed. Informed patient that this is an ACC visit and that we would only be able to address 1-2. He is requesting stronger pain medication than tramadol for his chronic bilateral hand pain. When informed that he needed to follow up with his PCP for this he became very upset stating that he would find a new PCP.   Past Medical History:  Diagnosis Date  . CHF (congestive heart failure) (H[facility])   . Critical lower limb ischemia, lt with ABI of 0.60 12/31/2011  . Gangrene (H[facility])    right foot  . GERD (gastroesophageal reflux disease)   . Hyperlipidemia   . Hypertension   . Neuromuscular disorder ([facility])    diabetic neruopathy  . Osteomyelitis ([facility]) 2010   left foot, s/p midfoot amputation  . Osteomyelitis ([facility]) 09/2013   RT BKA  . 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 PAD (peripheral artery disease) ([facility])    ABIs 11/30/11: L ABI 0.68, R ABI 0.84  . Pneumonia 2010  . PVD (peripheral vascular disease) (H[facility]) 12/31/2011  . S/P angioplasty with stent, 12/30/11, of Lt SFA, Post. tibialis and PTA of L. ant and post. tibial vessels 12/31/2011  . Type II diabetes mellitus (HCC) ~ 2002    Review of Systems  Constitutional: Negative for fever.  Genitourinary: Positive for dysuria and hematuria. Negative for flank pain.     Physical Exam:  Vitals:   04/06/17 1326  BP: 139/62  Pulse: 85  Temp: 98.2 F (36.8 C)  TempSrc: Oral  SpO2: 100%  Weight: 189 lb 8 oz (86 kg)  Height: 6' (1.829 m)    Constitutional: NAD, appears comfortable Cardiovascular: RRR, no murmurs, rubs, or gallops.  Pulmonary/Chest: CTAB, no wheezes, rales, or  rhonchi. Extremities: Right AKA, Left leg warm & well perfused, no edema Psychiatric: Normal mood and affect  Assessment & Plan:   See Encounters Tab for problem based charting.  Patient discussed with Dr. Butcher  

## 2017-04-07 ENCOUNTER — Other Ambulatory Visit: Payer: Self-pay | Admitting: Pharmacist

## 2017-04-07 DIAGNOSIS — E1142 Type 2 diabetes mellitus with diabetic polyneuropathy: Secondary | ICD-10-CM

## 2017-04-07 MED ORDER — GABAPENTIN 400 MG PO CAPS: 800.0000 mg | ORAL_CAPSULE | Freq: Three times a day (TID) | ORAL | 3 refills | Status: DC

## 2017-04-07 MED FILL — GABAPENTIN 400 MG CAPSULE: 400 | 30 days supply | Qty: 180 | Fill #0

## 2017-04-07 NOTE — Progress Notes (Signed)
Internal Medicine Clinic Attending  Case discussed with Dr. Philipp Ovens at the time of the visit.  We reviewed the resident's history and exam and pertinent patient test results.  I agree with the assessment, diagnosis, and plan of care documented in the resident's note. Dr Philipp Ovens did discuss pt's desire to have multiple issues addressed. His pain will require more time than an add on issue in an acute visit and I agreed with sch another appt.

## 2017-04-07 NOTE — Progress Notes (Signed)
Increased gabapentin for better efficacy (from 1200 mg daily to 2400 mg daily) per PCP approval.

## 2017-04-14 ENCOUNTER — Telehealth: Payer: Self-pay | Admitting: Internal Medicine

## 2017-04-14 ENCOUNTER — Other Ambulatory Visit: Payer: Self-pay | Admitting: Internal Medicine

## 2017-04-14 DIAGNOSIS — E1142 Type 2 diabetes mellitus with diabetic polyneuropathy: Secondary | ICD-10-CM

## 2017-04-14 NOTE — Telephone Encounter (Signed)
Patient was very unpleast at his last visit demanding stronger pain medications, actually left the visit before allowing me to finish when I told him we would not be escalating to stronger opioids. He had not follow up with neurology as planned. He informed me he would be finding a new PCP. I believe he was previously Dr. Doristine Section patient? I see he has been reassigned to Dr. Genia Del. I am not sure he understands that we all work together. I will defer this decision to Dr. Genia Del. I would consider checking the database to see if he is filling regularly, and bring him in for follow up as soon as possible (not ACC).

## 2017-04-14 NOTE — Telephone Encounter (Signed)
PATIENT NEEDS REFILL FOR PAIN MEDICATION, TRAMADOL 50MG , TO [facility]

## 2017-04-15 NOTE — Telephone Encounter (Signed)
I agree with Dr Tarri Abernethy and Philipp Ovens. Pt with neuropathy, possibly diabetic. A1C uncontrolled and went from Aug until Dr Rivka Safer appt in April without care. Had gotten 4 month Gaba Rx in Aug so either ran out in Dec or taking suboptimal dosing. Agree with Dr Jerrell Mylar re-prescribing Gaba although if he hasn't been taking it, I worry about starting at the high dose. I would encourage him to sch ACC appt since Dr Jerrell Mylar appt not until May but he would need to be informed that appt would be for DM mgmt and non-opioid tx of pain - ie assess gaba, ensure he has had adequate trial of it at appropriate dose, and moving to adding / substituting meds like lyrica or cymbalta. If all he wants is [place] to write for a hydrocodone or oxycodone then he is at the wrong clinic and needs to transfer care. Doris - can you help assess pt's wants?

## 2017-04-15 NOTE — Telephone Encounter (Signed)
Hi Helen,   Unfortunately I have never met this patient before. Looking through his chart and checking the controlled substance database, it does not appear that the tramadol is a long term medication. It looks like Dr. Daryll Drown sent out 1 prescription for 30 days, then 2 for 7 days. His filling history is inconsistent.   I am not in clinic this month, but if he would like he can schedule an appointment with me in May so we can work towards better controlling his neuropathy symptoms.   Thanks for your help.   Larkin Ina

## 2017-04-19 NOTE — Telephone Encounter (Signed)
Patient called in requesting status update on pain med. Patient transferred to Practice Administrator for this discussion. Hubbard Hartshorn, RN, BSN

## 2017-05-19 ENCOUNTER — Ambulatory Visit: Payer: Medicare HMO | Admitting: Pharmacist

## 2017-05-19 ENCOUNTER — Other Ambulatory Visit: Payer: Self-pay

## 2017-05-19 ENCOUNTER — Ambulatory Visit (INDEPENDENT_AMBULATORY_CARE_PROVIDER_SITE_OTHER): Payer: Medicare HMO | Admitting: Internal Medicine

## 2017-05-19 ENCOUNTER — Encounter: Payer: Self-pay | Admitting: Internal Medicine

## 2017-05-19 VITALS — BP 175/78 | HR 89 | Temp 99.9°F | Ht 72.0 in | Wt 195.4 lb

## 2017-05-19 DIAGNOSIS — IMO0002 Reserved for concepts with insufficient information to code with codable children: Secondary | ICD-10-CM

## 2017-05-19 DIAGNOSIS — I10 Essential (primary) hypertension: Secondary | ICD-10-CM | POA: Diagnosis not present

## 2017-05-19 DIAGNOSIS — N390 Urinary tract infection, site not specified: Secondary | ICD-10-CM | POA: Diagnosis not present

## 2017-05-19 DIAGNOSIS — E118 Type 2 diabetes mellitus with unspecified complications: Secondary | ICD-10-CM

## 2017-05-19 DIAGNOSIS — Z79891 Long term (current) use of opiate analgesic: Secondary | ICD-10-CM

## 2017-05-19 DIAGNOSIS — Z8619 Personal history of other infectious and parasitic diseases: Secondary | ICD-10-CM | POA: Diagnosis not present

## 2017-05-19 DIAGNOSIS — Z794 Long term (current) use of insulin: Secondary | ICD-10-CM | POA: Diagnosis not present

## 2017-05-19 DIAGNOSIS — E1165 Type 2 diabetes mellitus with hyperglycemia: Secondary | ICD-10-CM | POA: Diagnosis not present

## 2017-05-19 DIAGNOSIS — R3989 Other symptoms and signs involving the genitourinary system: Secondary | ICD-10-CM

## 2017-05-19 DIAGNOSIS — R809 Proteinuria, unspecified: Secondary | ICD-10-CM | POA: Diagnosis not present

## 2017-05-19 DIAGNOSIS — D721 Eosinophilia: Secondary | ICD-10-CM | POA: Diagnosis not present

## 2017-05-19 DIAGNOSIS — Z79899 Other long term (current) drug therapy: Secondary | ICD-10-CM

## 2017-05-19 DIAGNOSIS — R319 Hematuria, unspecified: Secondary | ICD-10-CM

## 2017-05-19 DIAGNOSIS — D649 Anemia, unspecified: Secondary | ICD-10-CM

## 2017-05-19 DIAGNOSIS — R69 Illness, unspecified: Secondary | ICD-10-CM | POA: Diagnosis not present

## 2017-05-19 DIAGNOSIS — E1142 Type 2 diabetes mellitus with diabetic polyneuropathy: Secondary | ICD-10-CM | POA: Diagnosis not present

## 2017-05-19 DIAGNOSIS — F17200 Nicotine dependence, unspecified, uncomplicated: Secondary | ICD-10-CM

## 2017-05-19 LAB — GLUCOSE, CAPILLARY: Glucose-Capillary: 308 mg/dL — ABNORMAL HIGH (ref 65–99)

## 2017-05-19 LAB — POCT GLYCOSYLATED HEMOGLOBIN (HGB A1C): Hemoglobin A1C: 12.9

## 2017-05-19 MED ORDER — TRAMADOL HCL 50 MG PO TABS: 50.0000 mg | ORAL_TABLET | Freq: Two times a day (BID) | ORAL | 0 refills | Status: DC | PRN

## 2017-05-19 MED ORDER — INSULIN ASPART 100 UNIT/ML FLEXPEN: 7.0000 [IU] | PEN_INJECTOR | Freq: Three times a day (TID) | SUBCUTANEOUS | 3 refills | Status: DC

## 2017-05-19 MED ORDER — LISINOPRIL 5 MG PO TABS: 5.0000 mg | ORAL_TABLET | Freq: Every day | ORAL | 0 refills | Status: DC

## 2017-05-19 MED ORDER — INSULIN DEGLUDEC-LIRAGLUTIDE 100-3.6 UNIT-MG/ML ~~LOC~~ SOPN: 16.0000 [IU] | PEN_INJECTOR | Freq: Every day | SUBCUTANEOUS | 2 refills | Status: DC

## 2017-05-19 MED FILL — LISINOPRIL 5 MG TABLET: 5 | 90 days supply | Qty: 90 | Fill #0

## 2017-05-19 MED FILL — NOVOLOG FLEXPEN SYRINGE: 100 | 71 days supply | Qty: 15 | Fill #0

## 2017-05-19 MED FILL — traMADol HCL 50 MG TABS: 50 | 30 days supply | Qty: 60 | Fill #0

## 2017-05-19 MED FILL — XULTOPHY 100 UNIT-3.6MG/ML: 100-3.6 | 90 days supply | Qty: 15 | Fill #0

## 2017-05-19 NOTE — Assessment & Plan Note (Addendum)
Patient presented for continued evaluation and management of his uncontrolled DM. I reviewed the patient medication with him and he was not taking the medication as prescribed. He is currently taking Novolog 7 units with meals, Lantus 21 units at QHS, and Metformin 1000 mg BID. The patient's weight is unchanged. He does not eat a diabetic diet and does eat breads and pastas. He denies the use of sugary beverages.   He checks is CBGs daily and they are always above 300. His A1c today is >12. We discussed transitioning for Lantus to [place]. He agrees with the changes. He will follow-up in 4 weeks.   Plan: - Continue Metformin 1000 mg BID - Continue Novolog 7 units TID  - START Xultophy 100/3.6 16 units QD - STOP Lantus   Off lisinopril

## 2017-05-19 NOTE — Progress Notes (Signed)
   CC: Blood in the urine  HPI:  [name] is a 57 y.o. male who presented to the clinic for evaluation and management of his chronic medical illnesses and hematuria. For a detailed assessment and plan please refer to problem based charting below.   Past Medical History:  Diagnosis Date  . CHF (congestive heart failure) ([place])   . Critical lower limb ischemia, lt with ABI of 0.60 12/31/2011  . Gangrene ([place])    right foot  . GERD (gastroesophageal reflux disease)   . Hyperlipidemia   . Hypertension   . Neuromuscular disorder ([place])    diabetic neruopathy  . Osteomyelitis ([place]) 2010   left foot, s/p midfoot amputation  . 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 PAD (peripheral artery disease) ([place])    ABIs 11/30/11: L ABI 0.68, R ABI 0.84  . Pneumonia 2010  . PVD (peripheral vascular disease) ([place]) 12/31/2011  . S/P angioplasty with stent, 12/30/11, of Lt SFA, Post. tibialis and PTA of L. ant and post. tibial vessels 12/31/2011  . Type II diabetes mellitus ([place]) ~ 2002   Review of Systems:   Denies CP, SOB, hemoptysis  Denies changes in stool or blood in the stool   Physical Exam: Vitals:   05/19/17 1529  BP: (!) 175/78  Pulse: 89  Temp: 99.9 F (37.7 C)  TempSrc: Oral  SpO2: 100%  Weight: 195 lb 6.4 oz (88.6 kg)  Height: 6' (1.829 m)   General: Well nourished male in no acute distress Pulm: Good air movement with no wheezing or crackles  CV: RRR, no murmurs, no rubs  Extremities: S/p right below the knee amputation, left LE without edema but does have hyperpigmentation   Assessment & Plan:   See Encounters Tab for problem based charting.  Patient discussed with Dr. Angelia Mould

## 2017-05-19 NOTE — Patient Instructions (Addendum)
Thank you for allowing [place] to provide your care. Today we are doing the following:  1. STOP Lantus   2. START Xultophy 16 units in the morning   3. Continue your gabapentin as prescribed.   4. START Tramadol 50 mg BID   5. START Lisinopril 5 mg daily   6. I will call you with the results of your lab work.   7. Please come back to see me in 4 weeks so we can continue to work on your pain and diabetes.

## 2017-05-19 NOTE — Progress Notes (Signed)
Patient was seen today in a co-visit between the physician and pharmacist.  During this visit, patient education and sample of Xultophy were provided. Advised patient to contact clinic if further medication help needed. Patient verbalized understanding.  Please see documentation under Dr. Jerrell Mylar visit today for more details.

## 2017-05-20 ENCOUNTER — Encounter: Payer: Self-pay | Admitting: Internal Medicine

## 2017-05-20 LAB — CBC WITH DIFFERENTIAL/PLATELET
BASOS: 0 %
Basophils Absolute: 0 10*3/uL (ref 0.0–0.2)
EOS (ABSOLUTE): 0.7 10*3/uL — ABNORMAL HIGH (ref 0.0–0.4)
EOS: 8 %
HEMATOCRIT: 35.9 % — AB (ref 37.5–51.0)
HEMOGLOBIN: 11.8 g/dL — AB (ref 13.0–17.7)
IMMATURE GRANS (ABS): 0 10*3/uL (ref 0.0–0.1)
IMMATURE GRANULOCYTES: 0 %
LYMPHS: 24 %
Lymphocytes Absolute: 2 10*3/uL (ref 0.7–3.1)
MCH: 29.6 pg (ref 26.6–33.0)
MCHC: 32.9 g/dL (ref 31.5–35.7)
MCV: 90 fL (ref 79–97)
Monocytes Absolute: 0.6 10*3/uL (ref 0.1–0.9)
Monocytes: 7 %
NEUTROS ABS: 5.2 10*3/uL (ref 1.4–7.0)
NEUTROS PCT: 61 %
Platelets: 271 10*3/uL (ref 150–379)
RBC: 3.99 x10E6/uL — ABNORMAL LOW (ref 4.14–5.80)
RDW: 13.5 % (ref 12.3–15.4)
WBC: 8.5 10*3/uL (ref 3.4–10.8)

## 2017-05-20 LAB — URINALYSIS, ROUTINE W REFLEX MICROSCOPIC
Bilirubin, UA: NEGATIVE
Ketones, UA: NEGATIVE
NITRITE UA: NEGATIVE
SPEC GRAV UA: 1.016 (ref 1.005–1.030)
Urobilinogen, Ur: 0.2 mg/dL (ref 0.2–1.0)
pH, UA: 6 (ref 5.0–7.5)

## 2017-05-20 LAB — MICROSCOPIC EXAMINATION
CASTS: NONE SEEN /LPF
EPITHELIAL CELLS (NON RENAL): NONE SEEN /HPF (ref 0–10)
WBC, UA: >30 /hpf — AB (ref 0–5)

## 2017-05-20 NOTE — Assessment & Plan Note (Signed)
Patient continues to have severe diabetic neuropathy. Feels that the Gabapentin does not help very much and is no longer on Tramadol. We discussed restarting the patient tramadol and closely monitoring his pain. He agrees.

## 2017-05-20 NOTE — Assessment & Plan Note (Signed)
Patient's BP remains above goal. We will continue the patient's amlodipine and carvedilol. If renal function is normal we will start lisinopril.

## 2017-05-20 NOTE — Assessment & Plan Note (Signed)
Patient presented with continued complaints of hematuria. This started >4 weeks ago and is intermittent in nature. He was seen in the clinic on 04/06/2017 for initial evaluation of his hematuria but left before further testing could be obtained.   The patient states that >4 weeks ago he began to experience intermittent hematuria and pneumaturia. The episodes are accompanied by severe groin pain. He is not experience dysuria with the hematuria. He denies fevers, chills, N/V, hematochezia or bloody stools, weight loss, skin changes, or cough. He has experienced worsening arthralgias, myalgias, neuropathy, and abdominal bloating. He has never experienced anything like this before. He is a "stress smoker" and denies a history of EtOH or substance use. He has had an STD in the past but does not know what it was except that it was not syphilis.   Initial labs were obtained in the clinic and the UA was significant for pyuria, hematuria, and proteinuria. CBC was obtained that illustrated a new normocytic anemia and peripheral eosinophilia.   The patient's pneumaturia is helpful to guide work-up. The major causes include infection, vesicoenteric fistula, and instrumentation. The patient has not had recent instrumentation. He is not experiencing symptoms such has weight loss, hematochezia, or change in stool consistency that would indicate an underlying condition that places him at risk for a vesicoenteric fistula. The patient's UA illustrated few bacteria. Unfortunately cultures were not obtained. We will have the patient come in today and obtain a urine culture.   The other concerning features of the patient's lab work is his peripheral eosinophilia with an absolute count of 700. On PE no skin or pulmonary manifestations were noted; however, BMP was not obtained to check for renal involvement. It is possible this is a vasculitis but we will check the patient's renal function. The patient had a negative HIV in 2017 but  does not appear to have any labs for hepatitis.   Plan: - Order urine cultures  - Order protein to creatinine ratio - Order BMP, if creatinine elevated patient will need work-up for GN - If above is negative we will need a CT urography and urology referral for cystoscopy

## 2017-05-23 NOTE — Addendum Note (Signed)
Addended byIna Homes T on: 05/23/2017 05:17 AM   Modules accepted: Orders

## 2017-05-23 NOTE — Progress Notes (Signed)
Internal Medicine Clinic Attending  Case discussed with Dr. Helberg at the time of the visit.  We reviewed the resident's history and exam and pertinent patient test results.  I agree with the assessment, diagnosis, and plan of care documented in the resident's note.    

## 2017-05-24 ENCOUNTER — Other Ambulatory Visit (INDEPENDENT_AMBULATORY_CARE_PROVIDER_SITE_OTHER): Payer: Medicare HMO

## 2017-05-24 DIAGNOSIS — R319 Hematuria, unspecified: Secondary | ICD-10-CM

## 2017-05-25 LAB — BMP8+ANION GAP
Anion Gap: 15 mmol/L (ref 10.0–18.0)
BUN / CREAT RATIO: 8 — AB (ref 9–20)
BUN: 13 mg/dL (ref 6–24)
CALCIUM: 8.6 mg/dL — AB (ref 8.7–10.2)
CHLORIDE: 96 mmol/L (ref 96–106)
CO2: 22 mmol/L (ref 20–29)
Creatinine, Ser: 1.64 mg/dL — ABNORMAL HIGH (ref 0.76–1.27)
GFR calc non Af Amer: 46 mL/min/{1.73_m2} — ABNORMAL LOW (ref >59)
GFR, EST AFRICAN AMERICAN: 53 mL/min/{1.73_m2} — AB (ref >59)
GLUCOSE: 242 mg/dL — AB (ref 65–99)
POTASSIUM: 4.2 mmol/L (ref 3.5–5.2)
SODIUM: 133 mmol/L — AB (ref 134–144)

## 2017-05-25 LAB — TOXASSURE SELECT,+ANTIDEPR,UR

## 2017-06-10 DIAGNOSIS — R310 Gross hematuria: Secondary | ICD-10-CM | POA: Diagnosis not present

## 2017-06-13 MED FILL — SULFAMETHOXAZOLE-TMP DS TAB: 800-160 | 7 days supply | Qty: 14 | Fill #0

## 2017-06-22 MED FILL — ESOMEPRAZOLE MAG DR 40 MG C: 40 | 90 days supply | Qty: 90 | Fill #2

## 2017-06-23 ENCOUNTER — Encounter: Payer: Medicare HMO | Admitting: Internal Medicine

## 2017-07-11 DIAGNOSIS — R310 Gross hematuria: Secondary | ICD-10-CM | POA: Diagnosis not present

## 2017-07-11 DIAGNOSIS — N5201 Erectile dysfunction due to arterial insufficiency: Secondary | ICD-10-CM | POA: Diagnosis not present

## 2017-08-02 NOTE — Addendum Note (Signed)
Addended by: Orson Gear on: 08/02/2017 02:57 PM   Modules accepted: Orders

## 2017-08-08 DIAGNOSIS — R310 Gross hematuria: Secondary | ICD-10-CM | POA: Diagnosis not present

## 2017-08-18 ENCOUNTER — Ambulatory Visit (HOSPITAL_COMMUNITY)
Admission: RE | Admit: 2017-08-18 | Discharge: 2017-08-18 | Disposition: A | Discharge status: Home / Self Care | Payer: Medicare HMO | Source: Ambulatory Visit | Attending: Internal Medicine | Admitting: Internal Medicine

## 2017-08-18 ENCOUNTER — Encounter: Payer: Self-pay | Admitting: Internal Medicine

## 2017-08-18 ENCOUNTER — Ambulatory Visit (INDEPENDENT_AMBULATORY_CARE_PROVIDER_SITE_OTHER): Payer: Medicare HMO | Admitting: Internal Medicine

## 2017-08-18 ENCOUNTER — Other Ambulatory Visit: Payer: Self-pay

## 2017-08-18 VITALS — BP 181/95 | HR 102 | Temp 99.3°F | Ht 72.0 in | Wt 194.4 lb

## 2017-08-18 DIAGNOSIS — R319 Hematuria, unspecified: Secondary | ICD-10-CM

## 2017-08-18 DIAGNOSIS — Z79899 Other long term (current) drug therapy: Secondary | ICD-10-CM

## 2017-08-18 DIAGNOSIS — R053 Chronic cough: Secondary | ICD-10-CM

## 2017-08-18 DIAGNOSIS — E118 Type 2 diabetes mellitus with unspecified complications: Secondary | ICD-10-CM | POA: Diagnosis not present

## 2017-08-18 DIAGNOSIS — R918 Other nonspecific abnormal finding of lung field: Secondary | ICD-10-CM

## 2017-08-18 DIAGNOSIS — E1165 Type 2 diabetes mellitus with hyperglycemia: Secondary | ICD-10-CM | POA: Diagnosis not present

## 2017-08-18 DIAGNOSIS — R3989 Other symptoms and signs involving the genitourinary system: Secondary | ICD-10-CM | POA: Diagnosis not present

## 2017-08-18 DIAGNOSIS — I10 Essential (primary) hypertension: Secondary | ICD-10-CM

## 2017-08-18 DIAGNOSIS — E1142 Type 2 diabetes mellitus with diabetic polyneuropathy: Secondary | ICD-10-CM

## 2017-08-18 DIAGNOSIS — Z87891 Personal history of nicotine dependence: Secondary | ICD-10-CM | POA: Diagnosis not present

## 2017-08-18 DIAGNOSIS — R05 Cough: Secondary | ICD-10-CM | POA: Insufficient documentation

## 2017-08-18 DIAGNOSIS — Z79891 Long term (current) use of opiate analgesic: Secondary | ICD-10-CM

## 2017-08-18 DIAGNOSIS — IMO0002 Reserved for concepts with insufficient information to code with codable children: Secondary | ICD-10-CM

## 2017-08-18 DIAGNOSIS — Z794 Long term (current) use of insulin: Secondary | ICD-10-CM

## 2017-08-18 DIAGNOSIS — J90 Pleural effusion, not elsewhere classified: Secondary | ICD-10-CM

## 2017-08-18 LAB — POCT GLYCOSYLATED HEMOGLOBIN (HGB A1C): Hemoglobin A1C: 10.6 % — AB (ref 4.0–5.6)

## 2017-08-18 LAB — GLUCOSE, CAPILLARY: GLUCOSE-CAPILLARY: 209 mg/dL — AB (ref 70–99)

## 2017-08-18 MED ORDER — FLUTICASONE PROPIONATE 50 MCG/ACT NA SUSP
1.0000 | Freq: Every day | NASAL | 0 refills | Status: DC
Start: 2017-08-18 — End: 2017-12-26

## 2017-08-18 MED FILL — FLUTICASONE PROP 50 MCG SPR: 50 | 60 days supply | Qty: 16 | Fill #0

## 2017-08-18 NOTE — Progress Notes (Signed)
   CC: Hand/foot pain  HPI:  [name] is a 57 y.o. male who presented to the clinic for continued evaluation and management of his chronic medical illnesses. For a detailed assessment and plan please refer to problem based charting below.   Past Medical History:  Diagnosis Date  . CHF (congestive heart failure) ([place])   . Critical lower limb ischemia, lt with ABI of 0.60 12/31/2011  . Gangrene ([place])    right foot  . GERD (gastroesophageal reflux disease)   . Hyperlipidemia   . Hypertension   . Neuromuscular disorder ([place])    diabetic neruopathy  . Osteomyelitis ([place]) 2010   left foot, s/p midfoot amputation  . 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 PAD (peripheral artery disease) ([place])    ABIs 11/30/11: L ABI 0.68, R ABI 0.84  . Pneumonia 2010  . PVD (peripheral vascular disease) ([place]) 12/31/2011  . S/P angioplasty with stent, 12/30/11, of Lt SFA, Post. tibialis and PTA of L. ant and post. tibial vessels 12/31/2011  . Type II diabetes mellitus ([place]) ~ 2002   Review of Systems:   Denies fevers, chills, N/V, abdominal pain, palpitations, CP  Physical Exam: Vitals:   08/18/17 1543  BP: (!) 181/95  Pulse: (!) 102  Temp: 99.3 F (37.4 C)  TempSrc: Oral  SpO2: 99%  Weight: 194 lb 6.4 oz (88.2 kg)  Height: 6' (1.829 m)   General: Well nourished male in no acute distress Pulm: Diminished air movement with no obvious wheezing or crackles.  CV: RRR, no murmurs, no rubs  Abdomen: Active bowel sounds, soft, non-distended, no tenderness to palpation   Assessment & Plan:   See Encounters Tab for problem based charting.  Patient discussed with Dr. Lynnae January

## 2017-08-18 NOTE — Patient Instructions (Addendum)
Thank you for allowing [place] to provide your care. Today we are:  1. Getting a CXR  2. Start taking Flonase and an antihistamine like Zyrtec   3. Continue to work on exercising and titrate up you insulin by 2 units every 3-4 days until your AM blood sugar is <150. Do not take more that 32 units.  I will see you back in 3 months.

## 2017-08-19 ENCOUNTER — Other Ambulatory Visit: Payer: Self-pay | Admitting: Internal Medicine

## 2017-08-19 DIAGNOSIS — E118 Type 2 diabetes mellitus with unspecified complications: Principal | ICD-10-CM

## 2017-08-19 DIAGNOSIS — Z794 Long term (current) use of insulin: Principal | ICD-10-CM

## 2017-08-19 DIAGNOSIS — E1165 Type 2 diabetes mellitus with hyperglycemia: Secondary | ICD-10-CM

## 2017-08-19 DIAGNOSIS — IMO0002 Reserved for concepts with insufficient information to code with codable children: Secondary | ICD-10-CM

## 2017-08-19 NOTE — Assessment & Plan Note (Signed)
A1c still above goal but down from 12.9 to 10.6. He is doing well with the Xultophy but has not increased it. We discussed starting to increase it by 2 units every 3-4 days until his fasting CBG is <150. He voices understanding.   Plan: - Increase Xultophy by 2 units every 3-4 days until his fasting CBG is <150. Told to stop at 32 units.  - Continue Novolog 7 units TID with meals  - Follow-up in 3 months.

## 2017-08-19 NOTE — Assessment & Plan Note (Signed)
BP significantly elevated today. Currently on Amlodipine, Carvedilol, and Lisinopril. Will need increase his medications to obtain better control. He has also been taking Mucinex DM, which is causing it to be acutely elevated. Have asked him to DC this.

## 2017-08-19 NOTE — Assessment & Plan Note (Addendum)
Continue to have severe neuropathy secondary to his DM. He is on gabapentin and Tramadol but feels they are not helping. He has to wear compression gloves to decrease pain and states it is severely affecting his life. Does not feel he has decreased strength. States he was on some form of opiates in the past for this and it help tremendously.   Review of records indicates that he was started on Lyrica in the past but never took it due to concerns of side effects. It does not appear he was ever on narcotic medications consistently, only intermittently s/p amputation and for various other medical conditions. I will call the patient to discuss trying pregabalin or a tricyclic antidepressant.   Plan: - Switch to Lyrica, stop gabapentin

## 2017-08-19 NOTE — Assessment & Plan Note (Addendum)
Patient presenting with >8 weeks of cough. Describes it as intermittent and worsen in the supine position. Feels congestion in his right chest and when he has cough spells he become SOB and gets up thick mucus. He has not been sick recently or prior to the onset of the cough. No new medications. He has tried mucinex without relief but otherwise not tried anything. He had a cough related to GERD in the past but has continued his antiacids and feels this is different. He does have a history of smoking but is now not an everyday smoker. He denies fevers, chills, rhinorrhea, weight loss, changes in appetite, LE edema, SOB, orthopnea, palpitations.   On PE he has diminished air movement but otherwise not obvious findings. CXR was obtained that illustrated a right middle lobe lesion, concerning for a pleural plaque with bilateral effusions. The patient also recently had a CT abdomen an pelvis on 8/5 that illustrated a free flowing left pleural effusion and loculated right pleural effusion.  Given the patient's CXR findings the next step would be a CT chest with contrast. We will need a BMP prior to getting the CT chest. I am trying to call the patient to discuss these results.   Plan: - CT chest with contrast  - BMP prior to scan

## 2017-08-19 NOTE — Telephone Encounter (Signed)
Pt had visit on yesterday 08/18/2017.  Pt states he discussed that the traMADol (ULTRAM) 50 MG tablet he has been taking is not helping and wanted something else to take instead.  Patient would lie a call back as soon as possible.

## 2017-08-21 ENCOUNTER — Telehealth: Payer: Self-pay | Admitting: Internal Medicine

## 2017-08-21 NOTE — Assessment & Plan Note (Signed)
Patient states that he is no longer having hematuria but is having persistent pneumaturia. CT scan recently done did show gas in the bladder. I will call [facility] to discuss next steps but may need a CT pelvis with barium enema to assess for fistula.   Plan: - Contact [facility] to discuss CT results from 08/05 and appropriate next steps.

## 2017-08-21 NOTE — Telephone Encounter (Signed)
Attempted to call the patient 3x. Attempted on Friday with no answer, again on Saturday, and finally today on Sunday.   Want to discuss results of his CXR and recent CT scan.

## 2017-08-22 ENCOUNTER — Telehealth: Payer: Self-pay | Admitting: Internal Medicine

## 2017-08-22 DIAGNOSIS — E118 Type 2 diabetes mellitus with unspecified complications: Secondary | ICD-10-CM

## 2017-08-22 DIAGNOSIS — IMO0002 Reserved for concepts with insufficient information to code with codable children: Secondary | ICD-10-CM

## 2017-08-22 DIAGNOSIS — E1165 Type 2 diabetes mellitus with hyperglycemia: Secondary | ICD-10-CM

## 2017-08-22 DIAGNOSIS — Z794 Long term (current) use of insulin: Secondary | ICD-10-CM

## 2017-08-22 DIAGNOSIS — E1142 Type 2 diabetes mellitus with diabetic polyneuropathy: Secondary | ICD-10-CM

## 2017-08-22 MED FILL — metFORMIN HCL 1000 MG TABS: 1000 | 90 days supply | Qty: 180 | Fill #0

## 2017-08-22 NOTE — Telephone Encounter (Signed)
Pt missed calls for Dr Tarri Abernethy over the weekend, pls call back pt# [phone]   Needs refills on   metFORMIN (GLUCOPHAGE) 1000 MG tablet, gabapentin (NEURONTIN) 400 MG capsule  And would like something else for pain, tramadol is working @ [facility]; pt contact# [phone]

## 2017-08-22 NOTE — Progress Notes (Signed)
Internal Medicine Clinic Attending  Case discussed with Dr. Helberg at the time of the visit.  We reviewed the resident's history and exam and pertinent patient test results.  I agree with the assessment, diagnosis, and plan of care documented in the resident's note.    

## 2017-08-24 ENCOUNTER — Telehealth: Payer: Self-pay | Admitting: Internal Medicine

## 2017-08-24 MED ORDER — GABAPENTIN 400 MG PO CAPS: 800.0000 mg | ORAL_CAPSULE | Freq: Three times a day (TID) | ORAL | 3 refills | Status: DC

## 2017-08-24 MED ORDER — DULOXETINE HCL 60 MG PO CPEP: 60.0000 mg | ORAL_CAPSULE | Freq: Every day | ORAL | 3 refills | Status: DC

## 2017-08-24 MED FILL — GABAPENTIN 400 MG CAPSULE: 400 | 30 days supply | Qty: 90 | Fill #1

## 2017-08-24 MED FILL — DULoxetine HCL 60 MG CPEP: 60 | 30 days supply | Qty: 30 | Fill #0

## 2017-08-24 NOTE — Telephone Encounter (Signed)
Will call patient today

## 2017-08-24 NOTE — Telephone Encounter (Signed)
Spoke with the patient at length about the results of his CXR and further work-up including CT chest with contrast. He understands and would be willing to pursue further work-up. We also discussed the results of his recent CT abdomen done by urology and the associated findings. I will call their office to get more information about further work-up and cytology results. In regards to his neuropathic pain. He has not been taking the gabapentin as prescribed and I asked that he take it as prescribed (800mg  TID) and we will try duloxetine. We will stop tramadol as it has not been helping.

## 2017-08-24 NOTE — Telephone Encounter (Signed)
This is being addressed in separate phone encounter from 08/22/2017. Hubbard Hartshorn, RN, BSN

## 2017-08-24 NOTE — Telephone Encounter (Signed)
In error

## 2017-08-24 NOTE — Telephone Encounter (Signed)
Metformin was sent on 08/21/2017. Confirmed with pharmacist that patient has already picked this up. He has not picked up gabapentin since April and has active refills. She will get this ready for him. Hubbard Hartshorn, RN, BSN

## 2017-08-29 ENCOUNTER — Telehealth: Payer: Self-pay | Admitting: *Deleted

## 2017-08-29 NOTE — Telephone Encounter (Signed)
Insurance will not cover more than 3 tabs daily of gabapentin. [facility] outpatient pharmacy requesting Rx be changed to 800 mg caps 1 TID. Hubbard Hartshorn, RN, BSN

## 2017-08-30 ENCOUNTER — Other Ambulatory Visit: Payer: Self-pay | Admitting: Internal Medicine

## 2017-08-30 DIAGNOSIS — E11630 Type 2 diabetes mellitus with periodontal disease: Secondary | ICD-10-CM | POA: Diagnosis not present

## 2017-08-30 DIAGNOSIS — Z89511 Acquired absence of right leg below knee: Secondary | ICD-10-CM | POA: Diagnosis not present

## 2017-08-30 DIAGNOSIS — I10 Essential (primary) hypertension: Secondary | ICD-10-CM | POA: Diagnosis not present

## 2017-08-30 DIAGNOSIS — K219 Gastro-esophageal reflux disease without esophagitis: Secondary | ICD-10-CM | POA: Diagnosis not present

## 2017-08-30 DIAGNOSIS — E1142 Type 2 diabetes mellitus with diabetic polyneuropathy: Secondary | ICD-10-CM | POA: Diagnosis not present

## 2017-08-30 DIAGNOSIS — Z794 Long term (current) use of insulin: Secondary | ICD-10-CM | POA: Diagnosis not present

## 2017-08-30 DIAGNOSIS — Z89432 Acquired absence of left foot: Secondary | ICD-10-CM | POA: Diagnosis not present

## 2017-08-30 DIAGNOSIS — G8929 Other chronic pain: Secondary | ICD-10-CM | POA: Diagnosis not present

## 2017-08-30 DIAGNOSIS — R69 Illness, unspecified: Secondary | ICD-10-CM | POA: Diagnosis not present

## 2017-08-30 DIAGNOSIS — E1151 Type 2 diabetes mellitus with diabetic peripheral angiopathy without gangrene: Secondary | ICD-10-CM | POA: Diagnosis not present

## 2017-08-30 MED ORDER — GABAPENTIN 800 MG PO TABS: 800.0000 mg | ORAL_TABLET | Freq: Three times a day (TID) | ORAL | 2 refills | Status: DC

## 2017-08-30 NOTE — Progress Notes (Signed)
Change gabapentin prescription strength.

## 2017-08-31 NOTE — Telephone Encounter (Signed)
This was addressed by PCP 08/30/2017. Hubbard Hartshorn, RN, BSN

## 2017-09-12 ENCOUNTER — Other Ambulatory Visit: Payer: Self-pay | Admitting: Internal Medicine

## 2017-09-12 DIAGNOSIS — I10 Essential (primary) hypertension: Secondary | ICD-10-CM

## 2017-11-23 ENCOUNTER — Telehealth: Payer: Self-pay | Admitting: Internal Medicine

## 2017-11-23 NOTE — Telephone Encounter (Signed)
Pt calls and ask for tramadol, he still does not want to take lyrica at this time, he has gone to [place] due to his mother's wish for thanksgiving. He is very unhappy stating a lady doctor accused him of being addicted to opioids.  You may call him if need be.

## 2017-11-23 NOTE — Telephone Encounter (Signed)
Pt. Would like a call back about his pain medications and needs clarification on what he is suppose to be taken after his last visit.

## 2017-11-24 ENCOUNTER — Encounter: Payer: Medicare HMO | Admitting: Internal Medicine

## 2017-11-27 ENCOUNTER — Other Ambulatory Visit: Payer: Self-pay | Admitting: Internal Medicine

## 2017-11-27 DIAGNOSIS — E1142 Type 2 diabetes mellitus with diabetic polyneuropathy: Secondary | ICD-10-CM

## 2017-11-27 MED ORDER — TRAMADOL HCL 50 MG PO TABS: 50.0000 mg | ORAL_TABLET | Freq: Two times a day (BID) | ORAL | 2 refills | Status: DC | PRN

## 2017-11-27 NOTE — Progress Notes (Signed)
Patient called for refill of tramadol for neuropathy. Refill sent.

## 2017-11-28 MED FILL — traMADol HCL 50 MG TABS: 50 | 30 days supply | Qty: 60 | Fill #0

## 2017-11-29 MED FILL — GABAPENTIN 800 MG TABLET: 800 | 30 days supply | Qty: 90 | Fill #0

## 2017-11-29 NOTE — Telephone Encounter (Signed)
Rx for tramadol sent 11/27/2017 by PCP. Hubbard Hartshorn, RN, BSN

## 2017-12-19 ENCOUNTER — Encounter: Payer: Self-pay | Admitting: Internal Medicine

## 2017-12-19 ENCOUNTER — Ambulatory Visit (INDEPENDENT_AMBULATORY_CARE_PROVIDER_SITE_OTHER): Payer: Medicare HMO | Admitting: Internal Medicine

## 2017-12-19 VITALS — BP 174/85 | HR 93 | Temp 98.7°F | Wt 184.0 lb

## 2017-12-19 DIAGNOSIS — IMO0002 Reserved for concepts with insufficient information to code with codable children: Secondary | ICD-10-CM

## 2017-12-19 DIAGNOSIS — R05 Cough: Secondary | ICD-10-CM | POA: Diagnosis not present

## 2017-12-19 DIAGNOSIS — R053 Chronic cough: Secondary | ICD-10-CM

## 2017-12-19 DIAGNOSIS — E1142 Type 2 diabetes mellitus with diabetic polyneuropathy: Secondary | ICD-10-CM | POA: Diagnosis not present

## 2017-12-19 DIAGNOSIS — Z79899 Other long term (current) drug therapy: Secondary | ICD-10-CM | POA: Diagnosis not present

## 2017-12-19 DIAGNOSIS — Z794 Long term (current) use of insulin: Secondary | ICD-10-CM

## 2017-12-19 DIAGNOSIS — E1165 Type 2 diabetes mellitus with hyperglycemia: Secondary | ICD-10-CM | POA: Diagnosis not present

## 2017-12-19 DIAGNOSIS — Z79891 Long term (current) use of opiate analgesic: Secondary | ICD-10-CM | POA: Diagnosis not present

## 2017-12-19 DIAGNOSIS — I10 Essential (primary) hypertension: Secondary | ICD-10-CM

## 2017-12-19 DIAGNOSIS — E118 Type 2 diabetes mellitus with unspecified complications: Secondary | ICD-10-CM

## 2017-12-19 LAB — POCT GLYCOSYLATED HEMOGLOBIN (HGB A1C): Hemoglobin A1C: 9.2 % — AB (ref 4.0–5.6)

## 2017-12-19 LAB — GLUCOSE, CAPILLARY: GLUCOSE-CAPILLARY: 120 mg/dL — AB (ref 70–99)

## 2017-12-19 MED ORDER — AMLODIPINE BESYLATE 10 MG PO TABS: 10.0000 mg | ORAL_TABLET | Freq: Every day | ORAL | 3 refills | Status: DC

## 2017-12-19 MED ORDER — CARVEDILOL 6.25 MG PO TABS: 6.2500 mg | ORAL_TABLET | Freq: Two times a day (BID) | ORAL | 3 refills | Status: DC

## 2017-12-19 MED ORDER — GABAPENTIN 800 MG PO TABS: 800.0000 mg | ORAL_TABLET | Freq: Three times a day (TID) | ORAL | 2 refills | Status: DC

## 2017-12-19 MED FILL — AMLODIPINE BESYLATE 10 MG T: 10 | 90 days supply | Qty: 90 | Fill #0

## 2017-12-19 MED FILL — CARVEDILOL 6.25 MG TABLET: 6.25 | 90 days supply | Qty: 180 | Fill #0

## 2017-12-19 NOTE — Patient Instructions (Addendum)
Thank you for allowing me to provide your care. Today we are doing the following:  1. Please go for your CT chest on 12/30. This will give [place] the most information as to what is causing your cough.  2. Increase your gabapentin to three times daily.   3. Good work on your diabetes. Your A1c is down trending. Over the next couple weeks I would like you to begin to increase your Xultophy. To do this you need to check her blood sugars first thing in the morning. If they are consistently above 150 I would like you to increase by two units every 3 to 4 days until your morning blood sugars are consistently less than 150. Please do not exceed 50 units.  I will call you with the results of all your blood work. Please come back to see me in three months.

## 2017-12-20 ENCOUNTER — Encounter: Payer: Self-pay | Admitting: Internal Medicine

## 2017-12-20 NOTE — Progress Notes (Signed)
   CC: F/U cough, DM, and neuropathy  HPI:  [name] is a 57 y.o. male with PMHx listed below presenting for continued evaluation and management of his chronic medical illnesses. Please see the A&P for the status of the patient's chronic medical problems.  Past Medical History:  Diagnosis Date  . CHF (congestive heart failure) ([place])   . Critical lower limb ischemia, lt with ABI of 0.60 12/31/2011  . Gangrene ([place])    right foot  . GERD (gastroesophageal reflux disease)   . Hyperlipidemia   . Hypertension   . Neuromuscular disorder ([place])    diabetic neruopathy  . Osteomyelitis ([place]) 2010   left foot, s/p midfoot amputation  . 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 PAD (peripheral artery disease) ([place])    ABIs 11/30/11: L ABI 0.68, R ABI 0.84  . Pneumonia 2010  . PVD (peripheral vascular disease) ([place]) 12/31/2011  . S/P angioplasty with stent, 12/30/11, of Lt SFA, Post. tibialis and PTA of L. ant and post. tibial vessels 12/31/2011  . Type II diabetes mellitus ([place]) ~ 2002   Review of Systems:  Performed and all others negative.  Physical Exam: Vitals:   12/19/17 1452 12/19/17 1557  BP: (!) 181/96 (!) 174/85  Pulse: 94 93  Temp: 98.7 F (37.1 C)   TempSrc: Oral   SpO2: 100%   Weight: 184 lb (83.5 kg)    General: Well nourished male in no acute distress HENT: Posterior oropharynx is erythematous without exudates, buccal mucosa appears to have increased vascularity, no nodules or lesions noted on the tongue or mucosa, no LAD Pulm: Good air movement with bibasilar crackles, no dullness to percussion CV: RRR, no murmurs, no rubs  Abdomen: Active bowel sounds, soft, non-distended, no tenderness to palpation  Extremities: Pulses palpable in all extremities, no LE edema   Assessment & Plan:   See Encounters Tab for problem based charting.  Patient discussed with Dr. Eppie Gibson

## 2017-12-20 NOTE — Assessment & Plan Note (Addendum)
HPI: patient presents for continued evaluation and management of his type II diabetes mellitus. Current medications include metformin 1000 mg twice daily, NovoLog seven units TID with meals, and combination degludec-liraglutide 16 units QD. He states that he is taking these medications without any issues. He does not check his blood sugars consistently. He is not experiencing any episodes of feeling shaky, sweaty, weak, confused. He is starting to exercise more and states that he has started going to the [facility] and swimming more. He does not pay much attention to his diet; however, states that he is going to start.  Assessment/plan: patient presented for continued evaluation and management of his uncontrolled type II diabetes mellitus. His hemoglobin A1c has improved from 10.6 down to 9.2. We discussed proper titration of his degludec-liraglutide. He will begin checking fasting blood sugars and increase his degludec-liraglutide by two units every 3 to 4 days if his fasting blood sugars are consistently greater than 150. He will continue to work on lifestyle modifications. He is currently on a high intensity statin and an ACE inhibitor. He does need an influenza vaccine, ophthalmology exam, and foot exam at his next appointment.

## 2017-12-20 NOTE — Assessment & Plan Note (Signed)
HPI: patient presents for continued evaluation and management of his diabetic peripheral neuropathy. He is currently on duloxetine 60 mg daily, gabapentin 800 mg twice daily, and tramadol 50 mg twice daily. He states that since her last visit he has noticed improvement in his symptoms. He feels that approximately 30 minutes after taking the gabapentin he notices significant relief; however, this does not carry him to his next dose. He continues to wear gloves because it helps his neuropathy and keep his hands warm.  Assessment/plan: patient presents for continued evaluation and management of his severe diabetic peripheral neuropathy on his hands/feet. His current medication regimen has improved his symptoms; however, he cannot get complete relief with the BID dosing of gabapentin. We will therefore increase it to TID dosing to see if that will help with his neuropathy. He continues to express interest in switching from tramadol but we discussed that if the gabapentin does not work we can try Lyrica before switching opiate therapy is. He is in agreement.

## 2017-12-20 NOTE — Assessment & Plan Note (Signed)
HPI: patient presents for continued evaluation and management of his uncontrolled hypertension. He is supposed to be on amlodipine 10 mg daily, carvedilol 6.25 mg twice daily, and lisinopril 5 mg daily. He states that he is only taking the lisinopril and was unaware that he was supposed to be on the amlodipine and carvedilol as well. He does not have issues avoiding his medication and just states that he was not taking them because he was unaware. He otherwise feels well without headache, visual changes, chest pain, shortness of breath, abdominal pain.  Assessment/plan: Patient presents for continued evaluation and management of his uncontrolled hypertension in the setting of medication nonadherence. I have restarted his amlodipine 10 mg daily and carvedilol 6.25 mg twice daily. He will continue these medications and follow-up for blood pressure recheck. At his next appointment he will need repeat basic metabolic panel.

## 2017-12-20 NOTE — Assessment & Plan Note (Signed)
HPI: patient presented back in August with concerns of chronic cough. Continues to have cough that is worse in the supine position. It is worse with lying on his right side. Continues to have copious amounts of mucus production. States that he is tried over-the-counter mucolytics without relief. He is tried multiple over-the-counter medications for cough suppression and obtained no relief. He denies shortness of breath with ambulation, exertional chest pain, weight loss, pleuritic chest pain, dysphagia, new oral lesions. He does continue to smoke and drink alcohol occasionally. Previous occupation as a Oncologist. On physical exam is oropharynx is erythematous without exudates and is buccal mucosa appears to have increased vascularity. Otherwise I do not see any nodules or lesions. On pulmonary exam he does have some bibasilar crackles but no dullness to percussion.  Assessment/plan: Given the patient's previous chest x-ray and HPI I'm concerned for possible malignancy. We discussed the findings of his most recent chest x-ray and the need to pursue a CT of the chest. He is currently scheduled for the CT of his chest on December 30. I will follow-up these results and notify him to the next best steps.

## 2017-12-22 NOTE — Progress Notes (Signed)
Case discussed with Dr. Helberg at the time of the visit. We reviewed the resident's history and exam and pertinent patient test results. I agree with the assessment, diagnosis, and plan of care documented in the resident's note. 

## 2017-12-26 ENCOUNTER — Other Ambulatory Visit: Payer: Self-pay | Admitting: Internal Medicine

## 2017-12-26 DIAGNOSIS — R053 Chronic cough: Secondary | ICD-10-CM

## 2017-12-26 DIAGNOSIS — R05 Cough: Secondary | ICD-10-CM

## 2017-12-26 MED FILL — FLUTICASONE PROP 50 MCG SPR: 50 | 60 days supply | Qty: 16 | Fill #0

## 2018-01-02 ENCOUNTER — Ambulatory Visit (HOSPITAL_COMMUNITY)
Admission: RE | Admit: 2018-01-02 | Discharge: 2018-01-02 | Disposition: A | Discharge status: Home / Self Care | Payer: Medicare HMO | Source: Ambulatory Visit | Attending: Internal Medicine | Admitting: Internal Medicine

## 2018-01-02 ENCOUNTER — Encounter (HOSPITAL_COMMUNITY): Payer: Self-pay

## 2018-01-02 DIAGNOSIS — J90 Pleural effusion, not elsewhere classified: Secondary | ICD-10-CM | POA: Insufficient documentation

## 2018-01-02 DIAGNOSIS — R05 Cough: Secondary | ICD-10-CM | POA: Diagnosis present

## 2018-01-02 LAB — POCT I-STAT CREATININE: Creatinine, Ser: 1.8 mg/dL — ABNORMAL HIGH (ref 0.61–1.24)

## 2018-01-02 MED ORDER — IOHEXOL 300 MG/ML  SOLN: 100.0000 mL | Freq: Once | INTRAMUSCULAR | Status: DC | PRN

## 2018-01-02 MED ORDER — IOHEXOL 300 MG/ML  SOLN
100.0000 mL | Freq: Once | INTRAMUSCULAR | Status: AC | PRN
  Administered 2018-01-02: 100 mL via INTRAVENOUS

## 2018-01-06 ENCOUNTER — Telehealth: Payer: Self-pay | Admitting: Internal Medicine

## 2018-01-06 NOTE — Telephone Encounter (Signed)
Called patient to discuss the results of his recent CT chest. Discussed the possibilities including autoimmune, infectious, and malignant. Next steps to get him referred to a multidisciplinary thoracic conference. Case discussed with Dr. Beryle Beams and Dr. Eppie Gibson. Messages sent to [place]. Patient very anxious. Aware of the plan.

## 2018-01-06 NOTE — Telephone Encounter (Signed)
Attempted to call the patient about his recent CT chest. No Answer. No voicemail left. Will try again this afternoon.

## 2018-01-11 ENCOUNTER — Ambulatory Visit (HOSPITAL_COMMUNITY): Payer: Medicare HMO

## 2018-01-12 ENCOUNTER — Other Ambulatory Visit: Payer: Self-pay | Admitting: *Deleted

## 2018-01-12 NOTE — Progress Notes (Signed)
The proposed treatment discussed at thoracic cancer conference 01/12/18 is for discussion purpose only and is not a binding recommendation.  The patient was not physically examined nor present with their treatment options.  Therefore, a final treatment plan cannot be decided.

## 2018-01-13 ENCOUNTER — Telehealth: Payer: Self-pay | Admitting: Internal Medicine

## 2018-01-13 DIAGNOSIS — R590 Localized enlarged lymph nodes: Secondary | ICD-10-CM

## 2018-01-13 DIAGNOSIS — R05 Cough: Secondary | ICD-10-CM

## 2018-01-13 DIAGNOSIS — R053 Chronic cough: Secondary | ICD-10-CM

## 2018-01-13 MED ORDER — ALBUTEROL SULFATE HFA 108 (90 BASE) MCG/ACT IN AERS: 2.0000 | INHALATION_SPRAY | Freq: Four times a day (QID) | RESPIRATORY_TRACT | 2 refills | Status: DC | PRN

## 2018-01-13 MED FILL — VENTOLIN HFA 90 MCG INHALER: 108 (90 BAS | 25 days supply | Qty: 18 | Fill #0

## 2018-01-13 NOTE — Telephone Encounter (Signed)
Called patient to discussed the recommendations of the thoracic cancer conference. Will pursue PET scan then pending results will refer to pulmonology and thoracic surgery to determine best place to get tissue. Patient voices understanding. All questions and concerns addressed.

## 2018-01-20 DIAGNOSIS — E1142 Type 2 diabetes mellitus with diabetic polyneuropathy: Secondary | ICD-10-CM | POA: Diagnosis not present

## 2018-01-20 DIAGNOSIS — J449 Chronic obstructive pulmonary disease, unspecified: Secondary | ICD-10-CM | POA: Diagnosis not present

## 2018-01-20 DIAGNOSIS — Z89432 Acquired absence of left foot: Secondary | ICD-10-CM | POA: Diagnosis not present

## 2018-01-20 DIAGNOSIS — E1151 Type 2 diabetes mellitus with diabetic peripheral angiopathy without gangrene: Secondary | ICD-10-CM | POA: Diagnosis not present

## 2018-01-20 DIAGNOSIS — Z89511 Acquired absence of right leg below knee: Secondary | ICD-10-CM | POA: Diagnosis not present

## 2018-01-20 DIAGNOSIS — Z794 Long term (current) use of insulin: Secondary | ICD-10-CM | POA: Diagnosis not present

## 2018-01-20 DIAGNOSIS — E11630 Type 2 diabetes mellitus with periodontal disease: Secondary | ICD-10-CM | POA: Diagnosis not present

## 2018-01-20 DIAGNOSIS — R69 Illness, unspecified: Secondary | ICD-10-CM | POA: Diagnosis not present

## 2018-01-23 ENCOUNTER — Telehealth: Payer: Self-pay | Admitting: Internal Medicine

## 2018-01-24 ENCOUNTER — Encounter (HOSPITAL_COMMUNITY): Admission: RE | Admit: 2018-01-24 | Payer: Medicare HMO | Source: Ambulatory Visit

## 2018-01-24 ENCOUNTER — Ambulatory Visit (HOSPITAL_COMMUNITY): Payer: Medicare HMO

## 2018-01-27 ENCOUNTER — Telehealth: Payer: Self-pay | Admitting: *Deleted

## 2018-01-27 ENCOUNTER — Telehealth: Payer: Self-pay

## 2018-01-27 NOTE — Telephone Encounter (Signed)
Faxed clinical information to [facility] on 01/25/2018 for NM PET image [facility]. PA for this is still pending, it will takes 24-48 for the clinical information to be review.

## 2018-01-30 ENCOUNTER — Encounter (HOSPITAL_COMMUNITY): Payer: Medicare HMO

## 2018-01-30 ENCOUNTER — Telehealth: Payer: Self-pay | Admitting: Internal Medicine

## 2018-01-30 NOTE — Telephone Encounter (Signed)
Pt missed call, no vm, pt contact [phone]

## 2018-02-08 ENCOUNTER — Encounter (HOSPITAL_COMMUNITY): Payer: Self-pay

## 2018-02-08 ENCOUNTER — Ambulatory Visit (HOSPITAL_COMMUNITY)
Admission: RE | Admit: 2018-02-08 | Discharge: 2018-02-08 | Disposition: A | Discharge status: Home / Self Care | Payer: Medicare HMO | Source: Ambulatory Visit | Attending: Internal Medicine | Admitting: Internal Medicine

## 2018-02-08 DIAGNOSIS — R590 Localized enlarged lymph nodes: Secondary | ICD-10-CM | POA: Diagnosis not present

## 2018-02-08 LAB — GLUCOSE, CAPILLARY: Glucose-Capillary: 219 mg/dL — ABNORMAL HIGH (ref 70–99)

## 2018-02-08 MED ORDER — FLUDEOXYGLUCOSE F - 18 (FDG) INJECTION
9.0000 | Freq: Once | INTRAVENOUS | Status: AC
  Administered 2018-02-08: 9 via INTRAVENOUS

## 2018-02-10 ENCOUNTER — Telehealth: Payer: Self-pay | Admitting: Internal Medicine

## 2018-02-10 NOTE — Telephone Encounter (Signed)
Called the patient to discuss the results of the PET scan and the very low likelihood of malignancy. We will continue to follow. He will schedule an appointment for continue evaluation of his effusions.

## 2018-02-16 ENCOUNTER — Other Ambulatory Visit: Payer: Self-pay

## 2018-02-16 ENCOUNTER — Ambulatory Visit (INDEPENDENT_AMBULATORY_CARE_PROVIDER_SITE_OTHER): Payer: Medicare HMO | Admitting: Internal Medicine

## 2018-02-16 ENCOUNTER — Encounter: Payer: Self-pay | Admitting: Internal Medicine

## 2018-02-16 VITALS — BP 181/89 | HR 90 | Temp 98.0°F | Ht 72.0 in | Wt 183.5 lb

## 2018-02-16 DIAGNOSIS — N400 Enlarged prostate without lower urinary tract symptoms: Secondary | ICD-10-CM

## 2018-02-16 DIAGNOSIS — R918 Other nonspecific abnormal finding of lung field: Secondary | ICD-10-CM

## 2018-02-16 DIAGNOSIS — E1165 Type 2 diabetes mellitus with hyperglycemia: Secondary | ICD-10-CM | POA: Diagnosis not present

## 2018-02-16 DIAGNOSIS — Z79891 Long term (current) use of opiate analgesic: Secondary | ICD-10-CM | POA: Diagnosis not present

## 2018-02-16 DIAGNOSIS — E785 Hyperlipidemia, unspecified: Secondary | ICD-10-CM

## 2018-02-16 DIAGNOSIS — Z794 Long term (current) use of insulin: Secondary | ICD-10-CM

## 2018-02-16 DIAGNOSIS — E1142 Type 2 diabetes mellitus with diabetic polyneuropathy: Secondary | ICD-10-CM

## 2018-02-16 DIAGNOSIS — I10 Essential (primary) hypertension: Secondary | ICD-10-CM

## 2018-02-16 DIAGNOSIS — R8281 Pyuria: Secondary | ICD-10-CM | POA: Diagnosis not present

## 2018-02-16 DIAGNOSIS — Z79899 Other long term (current) drug therapy: Secondary | ICD-10-CM | POA: Diagnosis not present

## 2018-02-16 DIAGNOSIS — R319 Hematuria, unspecified: Secondary | ICD-10-CM

## 2018-02-16 DIAGNOSIS — E118 Type 2 diabetes mellitus with unspecified complications: Secondary | ICD-10-CM | POA: Diagnosis not present

## 2018-02-16 DIAGNOSIS — IMO0002 Reserved for concepts with insufficient information to code with codable children: Secondary | ICD-10-CM

## 2018-02-16 NOTE — Patient Instructions (Signed)
Thank you for allowing me to provide your care. Please continue to take all your medications as prescribed. Come back in 1 month for some lab work and CXR. Then in 3 months for a general visit.

## 2018-02-16 NOTE — Progress Notes (Signed)
   CC: F/U DM, HTN, Neuropathy, Lung nodules   HPI:  [name] is a 58 y.o. male with PMHx listed below presenting for F/U DM, HTN, Neuropathy, Lung nodules . Please see the A&P for the status of the patient's chronic medical problems.  Past Medical History:  Diagnosis Date  . CHF (congestive heart failure) ([place])   . Critical lower limb ischemia, lt with ABI of 0.60 12/31/2011  . Gangrene ([place])    right foot  . GERD (gastroesophageal reflux disease)   . Hyperlipidemia   . Hypertension   . Neuromuscular disorder ([place])    diabetic neruopathy  . Osteomyelitis ([place]) 2010   left foot, s/p midfoot amputation  . 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 PAD (peripheral artery disease) ([place])    ABIs 11/30/11: L ABI 0.68, R ABI 0.84  . Pneumonia 2010  . PVD (peripheral vascular disease) ([place]) 12/31/2011  . S/P angioplasty with stent, 12/30/11, of Lt SFA, Post. tibialis and PTA of L. ant and post. tibial vessels 12/31/2011  . Type II diabetes mellitus ([place]) ~ 2002   Review of Systems: Performed and all others negative.  Physical Exam: Vitals:   02/16/18 1430  BP: (!) 181/89  Pulse: 90  Temp: 98 F (36.7 C)  TempSrc: Oral  SpO2: 100%  Weight: 183 lb 8 oz (83.2 kg)  Height: 6' (1.829 m)   General: Well nourished male in no acute distress Pulm: Good air movement with no wheezing or crackles  CV: RRR, no murmurs, no rubs   POCUS: Lung exam. Bilateral pleural effusion appear to have resolved. A lines noted. Bladder: Enlarged prostate and air in bladder.   Assessment & Plan:   See Encounters Tab for problem based charting.  Patient seen with Dr. Evette Doffing

## 2018-02-17 ENCOUNTER — Encounter: Payer: Self-pay | Admitting: Internal Medicine

## 2018-02-17 LAB — URINALYSIS, ROUTINE W REFLEX MICROSCOPIC
BILIRUBIN UA: NEGATIVE
Glucose, UA: NEGATIVE
Ketones, UA: NEGATIVE
Nitrite, UA: NEGATIVE
PH UA: 6 (ref 5.0–7.5)
Specific Gravity, UA: 1.014 (ref 1.005–1.030)
UUROB: 0.2 mg/dL (ref 0.2–1.0)

## 2018-02-17 LAB — MICROSCOPIC EXAMINATION: CASTS: NONE SEEN /LPF

## 2018-02-17 LAB — CBC WITH DIFFERENTIAL/PLATELET
Basophils Absolute: 0 10*3/uL (ref 0.0–0.2)
Basos: 0 %
EOS (ABSOLUTE): 0.2 10*3/uL (ref 0.0–0.4)
Eos: 2 %
HEMOGLOBIN: 13.9 g/dL (ref 13.0–17.7)
Hematocrit: 41.8 % (ref 37.5–51.0)
Immature Grans (Abs): 0.1 10*3/uL (ref 0.0–0.1)
Immature Granulocytes: 1 %
Lymphocytes Absolute: 1.9 10*3/uL (ref 0.7–3.1)
Lymphs: 19 %
MCH: 29.8 pg (ref 26.6–33.0)
MCHC: 33.3 g/dL (ref 31.5–35.7)
MCV: 90 fL (ref 79–97)
Monocytes Absolute: 0.4 10*3/uL (ref 0.1–0.9)
Monocytes: 4 %
NEUTROS ABS: 7.2 10*3/uL — AB (ref 1.4–7.0)
Neutrophils: 74 %
Platelets: 302 10*3/uL (ref 150–450)
RBC: 4.66 x10E6/uL (ref 4.14–5.80)
RDW: 12.3 % (ref 11.6–15.4)
WBC: 9.7 10*3/uL (ref 3.4–10.8)

## 2018-02-17 LAB — LIPID PANEL
CHOLESTEROL TOTAL: 234 mg/dL — AB (ref 100–199)
Chol/HDL Ratio: 5.4 ratio — ABNORMAL HIGH (ref 0.0–5.0)
HDL: 43 mg/dL (ref >39)
LDL Calculated: 159 mg/dL — ABNORMAL HIGH (ref 0–99)
Triglycerides: 158 mg/dL — ABNORMAL HIGH (ref 0–149)
VLDL Cholesterol Cal: 32 mg/dL (ref 5–40)

## 2018-02-17 LAB — BMP8+ANION GAP
Anion Gap: 16 mmol/L (ref 10.0–18.0)
BUN/Creatinine Ratio: 10 (ref 9–20)
BUN: 19 mg/dL (ref 6–24)
CALCIUM: 9.3 mg/dL (ref 8.7–10.2)
CHLORIDE: 97 mmol/L (ref 96–106)
CO2: 23 mmol/L (ref 20–29)
Creatinine, Ser: 1.83 mg/dL — ABNORMAL HIGH (ref 0.76–1.27)
GFR, EST AFRICAN AMERICAN: 46 mL/min/{1.73_m2} — AB (ref >59)
GFR, EST NON AFRICAN AMERICAN: 40 mL/min/{1.73_m2} — AB (ref >59)
Glucose: 175 mg/dL — ABNORMAL HIGH (ref 65–99)
Potassium: 4.1 mmol/L (ref 3.5–5.2)
Sodium: 136 mmol/L (ref 134–144)

## 2018-02-17 NOTE — Assessment & Plan Note (Signed)
HPI: Patient with extensive workup over the past couple months. In short the patient was seen by urology for pneumaturia. Abdominal CT was obtained that illustrated loculated pleural effusions. CT chest was obtained that illustrated mediastinal adenopathy and complex bilateral pleural effusion's. Given the patient's risk for lung cancer his case was reviewed by the tumor board who recommended a PET scan. PET scan was obtained and it was felt to be low risk for lung cancer.   A/P: He presented for follow-up today. Bedside ultrasound illustrated resolution of the patient's effusions. We will get repeat chest x-ray in one month.

## 2018-02-17 NOTE — Assessment & Plan Note (Signed)
HPI: Patient with your long history of new materia and hematuria. Both these are intermittent. On his last abdominal scan he continues to have air in the bladder. Repeat point-of-care ultrasound today again illustrated air in the bladder. We will check a UA and gait urine cultures. He has been evaluated by urology.  A/P:  UA with pyuria and hematuria. We are getting a urine culture to see if any bacteria grows out to treat a possible infection. He has been evaluated by urology and had cystoscopy without any identified fistula. If he does not have a UTI would consider barium enema to look for fistula.

## 2018-02-17 NOTE — Assessment & Plan Note (Signed)
HPI: Patient with uncontrolled type II diabetes. His last hemoglobin A1c was 9.2. He was started on Degludec-Liraglutide 16 units daily when asked him titrate this until his fasting blood sugar was less than 160. He is currently taking 27 units and states that in the mornings his blood sugar is consistently less than 160. He is also on NovoLog seven units TID with meals and metformin. He is tolerating all his medications without issue. Is not had any episodes of hypoglycemia.  A/P: Patient will continue Degludec-Liraglutide 27 units QD, Novolog 7 units TID, and metfromin 1000 mg BID. He will follow-up in three months and we will check in A1c at that point. The patient is on lisinopril and rosuvastatin. Renal function remains stable.

## 2018-02-17 NOTE — Assessment & Plan Note (Signed)
Patient with hyperlipidemia. He is on rosuvastatin 20 mg daily. Repeat lipid panel today with an LDL of 153. We will confirm that he is taking his medications as prescribed.

## 2018-02-17 NOTE — Assessment & Plan Note (Signed)
HPI:  His blood pressure today is 181/89. He is specific on amlodipine 10 mg, carvedilol 6.25 mg BID, and lisinopril 5 mg q.d. He did not take these medications today. He states that he tries to remember to take them every day but Sundays forgets. He is not having any difficulty affording or obtaining the medications. He denies any orthostatic changes or side effects when he does take the medications.  A/P: Uncontrolled hypertension. Patient will start taking amlodipine 10 mg, carvedilol 6.25 mg BID, and lisinopril 5 mg more consistently. Stressed the importance of this.

## 2018-02-17 NOTE — Assessment & Plan Note (Signed)
HPI: Patient with difficulty control neuropathy. States that his pain is well controlled recently. He is on tramadol 50 mg BID, gabapentin 800 mg TID, and duloxetine 60 mg daily. He is not experiencing any adverse reactions because these medications. He has not had any recent falls.  A/P: Patient will continues  on tramadol 50 mg BID, gabapentin 800 mg TID, and duloxetine 60 mg daily.

## 2018-02-17 NOTE — Progress Notes (Signed)
Internal Medicine Clinic Attending  I saw and evaluated the patient.  I personally confirmed the key portions of the history and exam documented by Dr. Helberg and I reviewed pertinent patient test results.  The assessment, diagnosis, and plan were formulated together and I agree with the documentation in the resident's note. 

## 2018-02-22 ENCOUNTER — Telehealth: Payer: Self-pay | Admitting: Internal Medicine

## 2018-02-22 DIAGNOSIS — N3001 Acute cystitis with hematuria: Secondary | ICD-10-CM

## 2018-02-22 LAB — URINE CULTURE

## 2018-02-22 MED ORDER — CEPHALEXIN 250 MG PO CAPS: 250.0000 mg | ORAL_CAPSULE | Freq: Four times a day (QID) | ORAL | 0 refills | Status: AC

## 2018-02-22 MED FILL — CEPHALEXIN 250 MG CAPSULE: 250 | 10 days supply | Qty: 40 | Fill #0

## 2018-02-22 NOTE — Telephone Encounter (Signed)
Urine cultures returned with Klebsiella sensitive to all but ampicillin. Will treat as complicate UTI with cephalosporin. Avoiding common antibiotics for UTI given renal impairment. Patient has been notified of results and plan.

## 2018-02-27 ENCOUNTER — Telehealth: Payer: Self-pay | Admitting: Internal Medicine

## 2018-02-27 MED FILL — traMADol HCL 50 MG TABS: 50 | 30 days supply | Qty: 60 | Fill #1

## 2018-02-27 MED FILL — XULTOPHY 100 UNIT-3.6MG/ML: 100-3.6 | 90 days supply | Qty: 15 | Fill #1

## 2018-02-27 MED FILL — GABAPENTIN 800 MG TABLET: 800 | 30 days supply | Qty: 90 | Fill #0

## 2018-02-27 MED FILL — metFORMIN HCL 1000 MG TABS: 1000 | 90 days supply | Qty: 180 | Fill #1

## 2018-02-27 NOTE — Telephone Encounter (Signed)
Called patient. With starting Abx his pneumouria has resolved.

## 2018-03-16 ENCOUNTER — Other Ambulatory Visit (INDEPENDENT_AMBULATORY_CARE_PROVIDER_SITE_OTHER): Payer: Medicare HMO

## 2018-03-16 DIAGNOSIS — E1165 Type 2 diabetes mellitus with hyperglycemia: Secondary | ICD-10-CM

## 2018-03-16 DIAGNOSIS — E118 Type 2 diabetes mellitus with unspecified complications: Secondary | ICD-10-CM | POA: Diagnosis not present

## 2018-03-16 DIAGNOSIS — IMO0002 Reserved for concepts with insufficient information to code with codable children: Secondary | ICD-10-CM

## 2018-03-17 LAB — HEMOGLOBIN A1C
Est. average glucose Bld gHb Est-mCnc: 194 mg/dL
Hgb A1c MFr Bld: 8.4 % — ABNORMAL HIGH (ref 4.8–5.6)

## 2018-04-06 ENCOUNTER — Other Ambulatory Visit: Payer: Self-pay

## 2018-04-06 DIAGNOSIS — E1142 Type 2 diabetes mellitus with diabetic polyneuropathy: Secondary | ICD-10-CM

## 2018-04-06 NOTE — Telephone Encounter (Signed)
gabapentin (NEURONTIN) 800 MG tablet,  traMADol (ULTRAM) 50 MG tablet, REFILL REQUEST @ [facility].

## 2018-04-07 MED ORDER — GABAPENTIN 800 MG PO TABS: 800.0000 mg | ORAL_TABLET | Freq: Three times a day (TID) | ORAL | 2 refills | Status: DC

## 2018-04-07 MED ORDER — TRAMADOL HCL 50 MG PO TABS: 50.0000 mg | ORAL_TABLET | Freq: Two times a day (BID) | ORAL | 2 refills | Status: DC | PRN

## 2018-05-10 ENCOUNTER — Other Ambulatory Visit: Payer: Self-pay | Admitting: Internal Medicine

## 2018-05-10 DIAGNOSIS — R05 Cough: Secondary | ICD-10-CM

## 2018-05-10 DIAGNOSIS — IMO0002 Reserved for concepts with insufficient information to code with codable children: Secondary | ICD-10-CM

## 2018-05-10 DIAGNOSIS — R053 Chronic cough: Secondary | ICD-10-CM

## 2018-05-10 DIAGNOSIS — E118 Type 2 diabetes mellitus with unspecified complications: Secondary | ICD-10-CM

## 2018-05-10 DIAGNOSIS — R69 Illness, unspecified: Secondary | ICD-10-CM | POA: Diagnosis not present

## 2018-05-10 DIAGNOSIS — E1165 Type 2 diabetes mellitus with hyperglycemia: Secondary | ICD-10-CM

## 2018-05-10 DIAGNOSIS — E1142 Type 2 diabetes mellitus with diabetic polyneuropathy: Secondary | ICD-10-CM

## 2018-05-10 MED ORDER — TRAMADOL HCL 50 MG PO TABS: 50.0000 mg | ORAL_TABLET | Freq: Two times a day (BID) | ORAL | 0 refills | Status: DC | PRN

## 2018-05-10 MED ORDER — ACCU-CHEK FASTCLIX LANCETS MISC: 12 refills | Status: DC

## 2018-05-10 MED ORDER — ALBUTEROL SULFATE HFA 108 (90 BASE) MCG/ACT IN AERS: 2.0000 | INHALATION_SPRAY | Freq: Four times a day (QID) | RESPIRATORY_TRACT | 2 refills | Status: DC | PRN

## 2018-05-10 MED ORDER — INSULIN DEGLUDEC-LIRAGLUTIDE 100-3.6 UNIT-MG/ML ~~LOC~~ SOPN: 16.0000 [IU] | PEN_INJECTOR | Freq: Every day | SUBCUTANEOUS | 2 refills | Status: DC

## 2018-05-10 MED ORDER — GABAPENTIN 800 MG PO TABS: 800.0000 mg | ORAL_TABLET | Freq: Three times a day (TID) | ORAL | 2 refills | Status: DC

## 2018-05-10 MED FILL — ALBUTEROL SULFATE HFA 108 (: 108 (90 BAS | 25 days supply | Qty: 18 | Fill #0

## 2018-05-10 MED FILL — ACCU-CHEK FASTCLIX LANCETS: 34 days supply | Qty: 102 | Fill #0

## 2018-05-10 MED FILL — GABAPENTIN 800 MG TABS: 800 | 30 days supply | Qty: 90 | Fill #0

## 2018-05-10 MED FILL — XULTOPHY 100 UNIT-3.6MG/ML: 100-3.6 | 75 days supply | Qty: 12 | Fill #0

## 2018-05-10 MED FILL — traMADol HCL 50 MG TABS: 50 | 30 days supply | Qty: 60 | Fill #0

## 2018-05-10 NOTE — Telephone Encounter (Signed)
Needs refill on traMADol (ULTRAM) 50 MG tablet gabapentin (NEURONTIN) 800 MG tablet ACCU-CHEK FASTCLIX LANCETS MISC   Insulin Degludec-Liraglutide (XULTOPHY) 100-3.6 UNIT-MG/ML SOPN    albuterol (PROVENTIL HFA;VENTOLIN HFA) 108 (90 Base) MCG/ACT inhaler      ;pt contact [place], [place] - [address] [facility]

## 2018-05-12 ENCOUNTER — Other Ambulatory Visit: Payer: Self-pay

## 2018-05-12 DIAGNOSIS — R05 Cough: Secondary | ICD-10-CM

## 2018-05-12 DIAGNOSIS — R053 Chronic cough: Secondary | ICD-10-CM

## 2018-05-12 DIAGNOSIS — E1142 Type 2 diabetes mellitus with diabetic polyneuropathy: Secondary | ICD-10-CM

## 2018-05-12 NOTE — Telephone Encounter (Signed)
Requested rxs were refilled on 5/6 - pt was called and informed. Also informed he's on Xultophy.

## 2018-05-12 NOTE — Telephone Encounter (Signed)
traMADol (ULTRAM) 50 MG tablet   gabapentin (NEURONTIN) 800 MG tablet  albuterol (VENTOLIN HFA) 108 (90 Base) MCG/ACT inhaler, and Lantus insulin, refill request @  [place], [place] - [phone] (Phone) [phone] (Fax)

## 2018-05-18 ENCOUNTER — Encounter: Payer: Medicare HMO | Admitting: Internal Medicine

## 2018-05-24 ENCOUNTER — Telehealth: Payer: Self-pay

## 2018-05-24 ENCOUNTER — Telehealth: Payer: Self-pay | Admitting: *Deleted

## 2018-05-24 NOTE — Telephone Encounter (Signed)
NURSING TRIAGE NOTE FOR RESPIRATORY SYMPTOMS  Do you have a fever? "no, i've checked it, no fever" Do you have a cough?"it's worse at night or if I lay down, coughing up thick yellow stuff"  Do you have shortness of breath more than normal?"no"  Do you have chest pain?"no" Are you able to eat and drink normally?"yes"  Have you seen a physician for these symptoms? "no" "I feel like something is stuck in my throat" "I feel good except for the cough"  Action   I informed patient to expect a phone call from a physician soon and I sent request to front desk to schedule a virtual office appt for patient and arrive the patient.

## 2018-05-24 NOTE — Telephone Encounter (Signed)
[name] is a 58 y.o. male who was contacted on behalf of [facility] Task [facility].   Diabetes Assessment  DM meds and BS checks -  "What medications are you taking for diabetes?" metformin, Xultophy- got mixed up with Lantus (taking 21 units QD b/c that's how he took Lantus but rx says 16 units), Novolog 7 units TID right after he eats -  "How often do you check your blood sugars at home?" twice a day - "What have your blood sugars been?" 80-90 (lowest number is 80, never over 150) No hypoglycemia symptoms    High Blood Pressure Assessment  BP meds & BP checks -  "What medications are you taking for high blood pressure?" amlodipine, carvedilol Has not taken lisinopril- thought he was supposed to stop - "How often do you check your blood pressure at home?" no- he states he is not doing anything to make his blood pressure go crazy; feels normal - "What have your blood pressure readings been?" N/A  Coping with DM and BP - What else are you doing to help with your DM and BP - Diet? Lots of vegetables, not a lot of meat (does vegan diet with granddaughter), no desserts Exercise? Walks 5 miles every other day  Medication Access Issues  Medication Issues? -  "Are you getting your medicines refilled on time without skipping any doses?" no   - "Are you having any problems getting/taking your meds (cost, timing, transportation)?" no - Do you need any meds refilled? No- just had everything filled  Conclusion  Close the call  - "Any other questions or concerns?" no  Thought that he wouldn't have the appointment b/c of COVID- will message front desk  Having chest congestion- worse when getting up from laying down; he has tried OTC medication but has not helped- please assess at follow up  Has been using [facility] but wants to come back to internal medicine b/c he is happy with our services during this time!

## 2018-05-30 ENCOUNTER — Ambulatory Visit (INDEPENDENT_AMBULATORY_CARE_PROVIDER_SITE_OTHER): Payer: Medicare HMO | Admitting: Internal Medicine

## 2018-05-30 ENCOUNTER — Other Ambulatory Visit: Payer: Self-pay

## 2018-05-30 ENCOUNTER — Ambulatory Visit (HOSPITAL_COMMUNITY)
Admission: RE | Admit: 2018-05-30 | Discharge: 2018-05-30 | Disposition: A | Discharge status: Home / Self Care | Payer: Medicare HMO | Source: Ambulatory Visit | Attending: Internal Medicine | Admitting: Internal Medicine

## 2018-05-30 VITALS — BP 164/83 | HR 98 | Temp 98.7°F | Wt 190.4 lb

## 2018-05-30 DIAGNOSIS — R05 Cough: Secondary | ICD-10-CM | POA: Insufficient documentation

## 2018-05-30 DIAGNOSIS — R059 Cough, unspecified: Secondary | ICD-10-CM

## 2018-05-30 DIAGNOSIS — I10 Essential (primary) hypertension: Secondary | ICD-10-CM

## 2018-05-30 DIAGNOSIS — Z79899 Other long term (current) drug therapy: Secondary | ICD-10-CM

## 2018-05-30 DIAGNOSIS — R0989 Other specified symptoms and signs involving the circulatory and respiratory systems: Secondary | ICD-10-CM

## 2018-05-30 DIAGNOSIS — R053 Chronic cough: Secondary | ICD-10-CM

## 2018-05-30 DIAGNOSIS — K219 Gastro-esophageal reflux disease without esophagitis: Secondary | ICD-10-CM

## 2018-05-30 LAB — BASIC METABOLIC PANEL
Anion gap: 8 (ref 5–15)
BUN: 18 mg/dL (ref 6–20)
CO2: 25 mmol/L (ref 22–32)
Calcium: 8.7 mg/dL — ABNORMAL LOW (ref 8.9–10.3)
Chloride: 101 mmol/L (ref 98–111)
Creatinine, Ser: 2.13 mg/dL — ABNORMAL HIGH (ref 0.61–1.24)
GFR calc Af Amer: 38 mL/min — ABNORMAL LOW (ref >60)
GFR calc non Af Amer: 33 mL/min — ABNORMAL LOW (ref >60)
Glucose, Bld: 136 mg/dL — ABNORMAL HIGH (ref 70–99)
Potassium: 4 mmol/L (ref 3.5–5.1)
Sodium: 134 mmol/L — ABNORMAL LOW (ref 135–145)

## 2018-05-30 LAB — BRAIN NATRIURETIC PEPTIDE: B Natriuretic Peptide: 536.4 pg/mL — ABNORMAL HIGH (ref 0.0–100.0)

## 2018-05-30 MED ORDER — ESOMEPRAZOLE MAGNESIUM 40 MG PO CPDR: 40.0000 mg | DELAYED_RELEASE_CAPSULE | Freq: Every day | ORAL | 3 refills | Status: DC

## 2018-05-30 MED ORDER — FLUTICASONE PROPIONATE 50 MCG/ACT NA SUSP: NASAL | 0 refills | Status: DC

## 2018-05-30 MED FILL — ESOMEPRAZOLE MAG DR 40 MG C: 40 | 90 days supply | Qty: 90 | Fill #0

## 2018-05-30 MED FILL — FLUTICASONE PROP 50 MCG SPR: 50 | 60 days supply | Qty: 16 | Fill #0

## 2018-05-30 NOTE — Assessment & Plan Note (Signed)
BP Readings from Last 3 Encounters:  05/30/18 (!) 164/83  02/16/18 (!) 181/89  12/19/17 (!) 174/85   BP was elevated, but he didn't took his meds. For the last two days.  -Continue current management and re access during next appointment.

## 2018-05-30 NOTE — Assessment & Plan Note (Addendum)
Patient is having chronic cough. CT chest done in March 2020 shows lymphadenopathy with bilateral pleural effusion and atelectasis, there was some concern for malignancy, follow-up PET scan was in low risk.  -Patient is still high risk for malignancy.  COPD or interstitial lung disease can be a possibility.  Less likely to be due to CHF exacerbation.  GERD might be playing some role.  -We will repeat chest x-ray today. -BMP and BNP. -Continue to have similar findings-we will need a repeat CT chest and referral to pulmonology. -Will try naxium and flonase.

## 2018-05-30 NOTE — Progress Notes (Addendum)
   CC: Chest congestion and cough.  HPI:  [name] is a 58 y.o. with past medical history as listed below came to the clinic with complaint of chest congestion and cough with scant sputum which varies in color from clear to light yellow.  The symptoms are going on for many month, no acute exacerbation recently.  Per patient his symptoms of chest congestion along with some cough get worse when he is lying down, requiring to sit up and cough to clear his throat.  He was also experiencing postnasal drip.  Denies any sinus congestion.  Denies any chest pain, fever or chills.  Denies any exertional dyspnea, orthopnea or PND.  Denies any burning sensation or bad taste in his mouth.  Patient was on lisinopril but stating that he has stopped taking lisinopril for a while now, only takes carvedilol and amlodipine for his blood pressure which he never took today. Patient denies any nausea, vomiting, recent change in his weight or bowel habits.  Please see assessment and plan for his chronic conditions.  Addendum.  Patient labs shows elevated BNP and worsening of renal function.  Chest x-ray with persistent right lower lobe effusion. Called the patient and started him on Lasix 40 mg daily. He will need a follow-up in 2 weeks with repeat BMP. Will also needs a repeat CT chest during that appointment.  Past Medical History:  Diagnosis Date  . CHF (congestive heart failure) ([place])   . Critical lower limb ischemia, lt with ABI of 0.60 12/31/2011  . Gangrene ([place])    right foot  . GERD (gastroesophageal reflux disease)   . Hyperlipidemia   . Hypertension   . Neuromuscular disorder ([place])    diabetic neruopathy  . Osteomyelitis ([place]) 2010   left foot, s/p midfoot amputation  . 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 PAD (peripheral artery disease) ([place])    ABIs 11/30/11: L ABI 0.68, R ABI 0.84  . Pneumonia 2010  . PVD  (peripheral vascular disease) ([place]) 12/31/2011  . S/P angioplasty with stent, 12/30/11, of Lt SFA, Post. tibialis and PTA of L. ant and post. tibial vessels 12/31/2011  . Type II diabetes mellitus ([place]) ~ 2002   Review of Systems: Negative except mentioned in HPI  Physical Exam:  Vitals:   05/30/18 1534  BP: (!) 164/83  Pulse: 98  Temp: 98.7 F (37.1 C)  TempSrc: Oral  SpO2: 100%  Weight: 190 lb 6.4 oz (86.4 kg)    General: Vital signs reviewed.  Patient is well-developed and well-nourished, in no acute distress and cooperative with exam.  Head: Normocephalic and atraumatic. Eyes: EOMI, conjunctivae normal, no scleral icterus.  Neck: Supple, trachea midline, normal ROM, no JVD, Cardiovascular: RRR, S1 normal, S2 normal, no murmurs, gallops, or rubs. Pulmonary/Chest: Bilateral inspiratory crackles at bases. Abdominal: Soft, non-tender, non-distended, BS +,  Extremities: No lower extremity edema, right BKA with prosthetic leg. Skin: Warm, dry and intact. No rashes or erythema. Psychiatric: Normal mood and affect. speech and behavior is normal. Cognition and memory are normal.  Assessment & Plan:   See Encounters Tab for problem based charting.  Patient discussed with Dr. Lynnae January.

## 2018-05-30 NOTE — Patient Instructions (Signed)
Thank you for visiting clinic today. I am restarting Nexium and Flonase nasal spray, please take it regularly. Take your medications every day so your blood pressure remained under good control. We are checking some labs and repeating chest x-ray. We might have to repeat CT chest if needed. Please follow-up in 2 weeks.

## 2018-05-31 MED ORDER — FUROSEMIDE 40 MG PO TABS: 40.0000 mg | ORAL_TABLET | Freq: Every day | ORAL | 11 refills | Status: DC

## 2018-05-31 MED FILL — FUROSEMIDE 40 MG TAB: 40 | 30 days supply | Qty: 30 | Fill #0

## 2018-05-31 NOTE — Progress Notes (Signed)
Internal Medicine Clinic Attending  Case discussed with Dr. Reesa Chew at the time of the visit.  We reviewed the resident's history and exam and pertinent patient test results.  I agree with the assessment, diagnosis, and plan of care documented in the resident's note.  The patient's GFR is 38 and although the BNP is elevated, the elevation likely reflects his renal dysfunction as a BMP is partially cleared renally and not necessarily the presence of pulmonary edema.  The NEJM 2001 BNP study excluded patients with renal insufficiency so the role in level of 500 would not be valid in this patient.  Agree with close follow-up and repeat CT scan.

## 2018-05-31 NOTE — Addendum Note (Signed)
Addended by: Lorella Nimrod on: 05/31/2018 12:46 PM   Modules accepted: Orders

## 2018-06-29 ENCOUNTER — Other Ambulatory Visit: Payer: Self-pay | Admitting: Internal Medicine

## 2018-06-29 DIAGNOSIS — IMO0002 Reserved for concepts with insufficient information to code with codable children: Secondary | ICD-10-CM

## 2018-06-29 DIAGNOSIS — E1165 Type 2 diabetes mellitus with hyperglycemia: Secondary | ICD-10-CM

## 2018-06-29 MED FILL — metFORMIN HCL 1000 MG TABS: 1000 | 90 days supply | Qty: 180 | Fill #0

## 2018-07-19 ENCOUNTER — Telehealth: Payer: Self-pay | Admitting: *Deleted

## 2018-07-19 NOTE — Telephone Encounter (Signed)
Received PCS form [place] Patient's last visit with PCP was 02/16/2018 which is outside 90 day window for completing form. Call placed to patient and scheduled f/u with PCP 07/27/2018 at 1045. PCS form placed in PCP's box to complete after visit. Hubbard Hartshorn, RN, BSN

## 2018-07-27 ENCOUNTER — Ambulatory Visit (INDEPENDENT_AMBULATORY_CARE_PROVIDER_SITE_OTHER): Payer: Medicare Other | Admitting: Internal Medicine

## 2018-07-27 ENCOUNTER — Encounter: Payer: Self-pay | Admitting: Internal Medicine

## 2018-07-27 ENCOUNTER — Other Ambulatory Visit: Payer: Self-pay

## 2018-07-27 VITALS — BP 164/84 | HR 99 | Temp 98.2°F | Ht 72.0 in | Wt 184.4 lb

## 2018-07-27 DIAGNOSIS — I10 Essential (primary) hypertension: Secondary | ICD-10-CM

## 2018-07-27 DIAGNOSIS — N189 Chronic kidney disease, unspecified: Secondary | ICD-10-CM

## 2018-07-27 DIAGNOSIS — E1151 Type 2 diabetes mellitus with diabetic peripheral angiopathy without gangrene: Secondary | ICD-10-CM | POA: Diagnosis not present

## 2018-07-27 DIAGNOSIS — E118 Type 2 diabetes mellitus with unspecified complications: Secondary | ICD-10-CM | POA: Diagnosis not present

## 2018-07-27 DIAGNOSIS — Z794 Long term (current) use of insulin: Secondary | ICD-10-CM

## 2018-07-27 DIAGNOSIS — I129 Hypertensive chronic kidney disease with stage 1 through stage 4 chronic kidney disease, or unspecified chronic kidney disease: Secondary | ICD-10-CM | POA: Diagnosis not present

## 2018-07-27 DIAGNOSIS — E1122 Type 2 diabetes mellitus with diabetic chronic kidney disease: Secondary | ICD-10-CM

## 2018-07-27 DIAGNOSIS — IMO0002 Reserved for concepts with insufficient information to code with codable children: Secondary | ICD-10-CM

## 2018-07-27 DIAGNOSIS — R5383 Other fatigue: Secondary | ICD-10-CM

## 2018-07-27 DIAGNOSIS — Z79899 Other long term (current) drug therapy: Secondary | ICD-10-CM

## 2018-07-27 DIAGNOSIS — I951 Orthostatic hypotension: Secondary | ICD-10-CM

## 2018-07-27 DIAGNOSIS — E1142 Type 2 diabetes mellitus with diabetic polyneuropathy: Secondary | ICD-10-CM

## 2018-07-27 DIAGNOSIS — N529 Male erectile dysfunction, unspecified: Secondary | ICD-10-CM

## 2018-07-27 DIAGNOSIS — E1165 Type 2 diabetes mellitus with hyperglycemia: Secondary | ICD-10-CM

## 2018-07-27 DIAGNOSIS — Z9114 Patient's other noncompliance with medication regimen: Secondary | ICD-10-CM

## 2018-07-27 DIAGNOSIS — Z79891 Long term (current) use of opiate analgesic: Secondary | ICD-10-CM

## 2018-07-27 LAB — POCT GLYCOSYLATED HEMOGLOBIN (HGB A1C): Hemoglobin A1C: 9.1 % — AB (ref 4.0–5.6)

## 2018-07-27 LAB — GLUCOSE, CAPILLARY: Glucose-Capillary: 349 mg/dL — ABNORMAL HIGH (ref 70–99)

## 2018-07-27 MED ORDER — GABAPENTIN 800 MG PO TABS: 800.0000 mg | ORAL_TABLET | Freq: Three times a day (TID) | ORAL | 2 refills | Status: DC

## 2018-07-27 MED ORDER — TRAMADOL HCL 50 MG PO TABS: 50.0000 mg | ORAL_TABLET | Freq: Two times a day (BID) | ORAL | 0 refills | Status: DC | PRN

## 2018-07-27 MED ORDER — LISINOPRIL 5 MG PO TABS: 5.0000 mg | ORAL_TABLET | Freq: Every day | ORAL | 0 refills | Status: DC

## 2018-07-27 MED ORDER — AMLODIPINE BESYLATE 10 MG PO TABS: 10.0000 mg | ORAL_TABLET | Freq: Every day | ORAL | 3 refills | Status: DC

## 2018-07-27 MED ORDER — CARVEDILOL 6.25 MG PO TABS: 6.2500 mg | ORAL_TABLET | Freq: Two times a day (BID) | ORAL | 3 refills | Status: DC

## 2018-07-27 MED ORDER — SILDENAFIL CITRATE 50 MG PO TABS: 50.0000 mg | ORAL_TABLET | Freq: Every day | ORAL | 0 refills | Status: DC | PRN

## 2018-07-27 MED FILL — LISINOPRIL 5 MG TABLET: 5 | 90 days supply | Qty: 90 | Fill #0

## 2018-07-27 MED FILL — traMADol HCL 50 MG TABS: 50 | 7 days supply | Qty: 14 | Fill #0

## 2018-07-27 MED FILL — SILDENAFIL CITRATE 50 MG TA: 50 | 30 days supply | Qty: 4 | Fill #0

## 2018-07-27 MED FILL — GABAPENTIN 800 MG TABLET: 800 | 30 days supply | Qty: 90 | Fill #0

## 2018-07-27 MED FILL — AMLODIPINE BESYLATE 10 MG T: 10 | 90 days supply | Qty: 90 | Fill #0

## 2018-07-27 MED FILL — CARVEDILOL 6.25 MG TABLET: 6.25 | 90 days supply | Qty: 180 | Fill #0

## 2018-07-27 NOTE — Patient Instructions (Addendum)
Thank you for allowing me to provide your care. I think your current symptoms are related to your uncontrolled diabetes but we are checking some lab work today and I will call you if anything is abnormal.   Please take all your medications as prescribed.   Start checking you blood sugar in the morning when you first wake up, right before lunch, and right before bedtime. If your morning blood sugar is above 160 for 2-3 days in a row I would like you to increase your Xultophy by 2 units. Repeat this process until you morning blood sugar is <160 or you reach 33 units of Xultophy. Do this consistently for the next 2-3 weeks then come back to see me. We need to get your diabetes under control. Continue your insulin and metformin as prescribed for now.

## 2018-07-27 NOTE — Assessment & Plan Note (Addendum)
He has experienced lightheaded, fatigue, and blurry vision for the past 1-2 months. Feels progressive. Fatigue with any type of activity. No chest pain, SHOB, palpitation, fevers, chills, changes in skin, changes in fingernails, constipation, diarrhea, abdominal pain, PND, orthopnea Endorses diffuse hair thinning (scalp), decreased appetite, lost 6 lbs in past 2 months (has access to food, no difficulty swallowing, no early satiety, no diarrhea or malabsorptive symptoms), increased abdominal girth. Thinks this is related to COVID and lack of exercise.   Orthostatics positive.   Plan:  - Check basic labs but anticipate this is related to uncontrolled DM and dehydration.

## 2018-07-27 NOTE — Assessment & Plan Note (Signed)
Well controlled on gabapentin and tramadol.

## 2018-07-27 NOTE — Progress Notes (Signed)
   CC: F/u HTN, DM, CKD  HPI:  Mr.Baldemar Lafauci is a 58 y.o. male with PMHx listed below presenting for F/u HTN, DM, CKD. Please see the A&P for the status of the patient's chronic medical problems.  Past Medical History:  Diagnosis Date  . CHF (congestive heart failure) ([place])   . Critical lower limb ischemia, lt with ABI of 0.60 12/31/2011  . Gangrene ([place])    right foot  . GERD (gastroesophageal reflux disease)   . Hyperlipidemia   . Hypertension   . Neuromuscular disorder ([place])    diabetic neruopathy  . Osteomyelitis ([place]) 2010   left foot, s/p midfoot amputation  . 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 PAD (peripheral artery disease) ([place])    ABIs 11/30/11: L ABI 0.68, R ABI 0.84  . Pneumonia 2010  . PVD (peripheral vascular disease) ([place]) 12/31/2011  . S/P angioplasty with stent, 12/30/11, of Lt SFA, Post. tibialis and PTA of L. ant and post. tibial vessels 12/31/2011  . Type II diabetes mellitus ([place]) ~ 2002   Review of Systems: Performed and all others negative.  Physical Exam: Vitals:   07/27/18 1105  BP: (!) 164/84  Pulse: 99  Temp: 98.2 F (36.8 C)  TempSrc: Oral  SpO2: 100%  Weight: 184 lb 6.4 oz (83.6 kg)  Height: 6' (1.829 m)   General: Well nourished male in no acute distress Pulm: Good air movement with no wheezing or crackles  CV: RRR, no murmurs, no rubs   Assessment & Plan:   See Encounters Tab for problem based charting.  Patient discussed with Dr. Evette Doffing

## 2018-07-27 NOTE — Assessment & Plan Note (Signed)
ED for greater than 12 months. He has been unable to sustain an erection. Does not have morning erections. GU exam unremarkable.   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 Stressed the importance of DM control. Trial of PDE-5 inhibitors

## 2018-07-27 NOTE — Assessment & Plan Note (Signed)
Patient on Amlodipine 10mg , Carvedilol 6.25mg  BID, and Lisinopril 5 mg QD. He takes these inconsistently and did not take them this AM. He describes orthostatic symptoms today.   Orthostatic vitals were positive.   Plan: - Patient will hold his HTN medication for the next 1-2 days while he tries hydrate.

## 2018-07-27 NOTE — Assessment & Plan Note (Addendum)
Uncontrolled on Metformin, Xultophy 21 units, Novolog 7 units TIDWC.. A1c of 9.1. No checking CBG. Last check was in March. Misses his xultophy frequently. Endorses polydipsia (>8L per day) and polyuria. Has meter with all supplies needed.   Plan:  - Discussed that his symptoms were all related to uncontrolled DM and the importance of taking his medications.  - Xultophy escalation to achieve fasting CBG <160. Told to stop if he reaches 33 units.  - Continue Novolog and Metformin

## 2018-07-28 LAB — CMP14 + ANION GAP
ALT: 11 IU/L (ref 0–44)
AST: 13 IU/L (ref 0–40)
Albumin/Globulin Ratio: 1.6 (ref 1.2–2.2)
Albumin: 3.8 g/dL (ref 3.8–4.9)
Alkaline Phosphatase: 73 IU/L (ref 39–117)
Anion Gap: 18 mmol/L (ref 10.0–18.0)
BUN/Creatinine Ratio: 7 — ABNORMAL LOW (ref 9–20)
BUN: 18 mg/dL (ref 6–24)
Bilirubin Total: <0.2 mg/dL (ref 0.0–1.2)
CO2: 21 mmol/L (ref 20–29)
Calcium: 8.6 mg/dL — ABNORMAL LOW (ref 8.7–10.2)
Chloride: 95 mmol/L — ABNORMAL LOW (ref 96–106)
Creatinine, Ser: 2.5 mg/dL — ABNORMAL HIGH (ref 0.76–1.27)
GFR calc Af Amer: 32 mL/min/{1.73_m2} — ABNORMAL LOW (ref >59)
GFR calc non Af Amer: 27 mL/min/{1.73_m2} — ABNORMAL LOW (ref >59)
Globulin, Total: 2.4 g/dL (ref 1.5–4.5)
Glucose: 335 mg/dL — ABNORMAL HIGH (ref 65–99)
Potassium: 4.2 mmol/L (ref 3.5–5.2)
Sodium: 134 mmol/L (ref 134–144)
Total Protein: 6.2 g/dL (ref 6.0–8.5)

## 2018-07-28 LAB — CBC WITH DIFFERENTIAL/PLATELET
Basophils Absolute: 0 10*3/uL (ref 0.0–0.2)
Basos: 0 %
EOS (ABSOLUTE): 0.1 10*3/uL (ref 0.0–0.4)
Eos: 1 %
Hematocrit: 39.3 % (ref 37.5–51.0)
Hemoglobin: 13.3 g/dL (ref 13.0–17.7)
Immature Grans (Abs): 0 10*3/uL (ref 0.0–0.1)
Immature Granulocytes: 0 %
Lymphocytes Absolute: 1.8 10*3/uL (ref 0.7–3.1)
Lymphs: 21 %
MCH: 30.1 pg (ref 26.6–33.0)
MCHC: 33.8 g/dL (ref 31.5–35.7)
MCV: 89 fL (ref 79–97)
Monocytes Absolute: 0.4 10*3/uL (ref 0.1–0.9)
Monocytes: 4 %
Neutrophils Absolute: 6.1 10*3/uL (ref 1.4–7.0)
Neutrophils: 74 %
Platelets: 247 10*3/uL (ref 150–450)
RBC: 4.42 x10E6/uL (ref 4.14–5.80)
RDW: 12.1 % (ref 11.6–15.4)
WBC: 8.4 10*3/uL (ref 3.4–10.8)

## 2018-07-28 LAB — HIV ANTIBODY (ROUTINE TESTING W REFLEX): HIV Screen 4th Generation wRfx: NONREACTIVE

## 2018-07-28 LAB — TSH: TSH: 1.29 u[IU]/mL (ref 0.450–4.500)

## 2018-07-28 LAB — HEPATITIS C ANTIBODY: Hep C Virus Ab: <0.1 s/co ratio (ref 0.0–0.9)

## 2018-07-28 NOTE — Progress Notes (Signed)
Internal Medicine Clinic Attending  Case discussed with Dr. Helberg at the time of the visit.  We reviewed the resident's history and exam and pertinent patient test results.  I agree with the assessment, diagnosis, and plan of care documented in the resident's note.    

## 2018-08-02 ENCOUNTER — Telehealth: Payer: Self-pay

## 2018-08-02 ENCOUNTER — Telehealth: Payer: Self-pay | Admitting: *Deleted

## 2018-08-02 NOTE — Telephone Encounter (Signed)
Please call pt back regarding PCS form.

## 2018-08-02 NOTE — Telephone Encounter (Addendum)
Call to [employer] for PA for Tramadol 50 mg tablets. Patient's insurance has a 7 day supply limit without PA.  Call to extend to 60 tablets for 30 days.  Awaiting determination within 72 hours.  [place] [phone].  Sander Nephew, RN 08/02/2018 9:01 AM.   Fax from [employer] PA approved 08/02/2018 thru 01/04/2019.  [facility] was called and notified of.  Claim ran through and approved. Sander Nephew, RN 08/03/2018  11:32 AM

## 2018-08-03 MED FILL — traMADol HCL 50 MG TABS: 50 | 23 days supply | Qty: 46 | Fill #1

## 2018-08-03 NOTE — Telephone Encounter (Signed)
Discussed with PCP and he will call patient today. Hubbard Hartshorn, RN, BSN

## 2018-08-07 ENCOUNTER — Telehealth: Payer: Self-pay | Admitting: Internal Medicine

## 2018-08-07 ENCOUNTER — Ambulatory Visit (INDEPENDENT_AMBULATORY_CARE_PROVIDER_SITE_OTHER): Payer: Medicare Other | Admitting: Internal Medicine

## 2018-08-07 ENCOUNTER — Other Ambulatory Visit: Payer: Self-pay

## 2018-08-07 ENCOUNTER — Encounter: Payer: Self-pay | Admitting: Internal Medicine

## 2018-08-07 DIAGNOSIS — E118 Type 2 diabetes mellitus with unspecified complications: Secondary | ICD-10-CM | POA: Diagnosis not present

## 2018-08-07 DIAGNOSIS — R42 Dizziness and giddiness: Secondary | ICD-10-CM | POA: Diagnosis not present

## 2018-08-07 NOTE — Assessment & Plan Note (Signed)
Dizziness: [name] is a very pleasant African-American gentleman who is presented to [facility] for evaluation of chronic dizziness and lightheadedness.  He was evaluated by Dr. Tarri Abernethy on July 27, 2018 and he complained of a 1 to 39-month history of lightheadedness fatigue and blurry vision.  He stated that these episodes are typically worse when he stands up and it usually happens about 10 minutes into a standing position.  He denies chest pain, palpitation, prodrome, syncope, lower extremity edema or orthopnea.  He also does not report of any symptoms that would suggest cerebrovascular accident.  It is worth noting that his last visit, orthostatic vitals were positive. He states that he continues to experience polyuria and polydipsia.  He states that sometimes his symptoms of dizziness does get better when he rubs alcohol over his head.  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  Hypovolemia less likely given negative orthostatic vitals.  Does not have vertigo making BPPV less likely and also his symptoms does not change with position of his head.  He does not report of tinnitus and thus Mnire's disease is very less likely.  Plan: - Advised to only take Lasix when his weight is 3 pounds over/day or 5 pounds/week.  - Given strict return precautions

## 2018-08-07 NOTE — Patient Instructions (Signed)
[name],  It was a pleasure taking care of you at the clinic today.  The symptoms of dizziness you are having as we discussed could be due to the long-term consequences of uncontrolled diabetes.  So I would encourage you to continue taking your diabetes medications as indicated  Also, it could be due to fluid imbalance and as we discussed.  I want you to purchase weight at [facility], [facility] or [facility].  I would like for you to take the Lasix as needed.  Please check your weight every day, if your weight increases by 3 pounds a day or 5 pounds in a week, then take your Lasix.  Take Care! Dr. Eileen Stanford  Please call the internal medicine center clinic if you have any questions or concerns, we may be able to help and keep you from a long and expensive emergency room wait. Our clinic and after hours phone number is [phone], the best time to call is Monday through Friday 9 am to 4 pm but there is always someone available 24/7 if you have an emergency. If you need medication refills please notify your pharmacy one week in advance and they will send [place] a request.

## 2018-08-07 NOTE — Telephone Encounter (Signed)
Spoke with the patient on the phone on 7/31. Continues to do poorly with his DM management. He has not been checking his sugar. He has generalized fatigue and overal a failure to thrive picture that I believe is secondary to his uncontrolled DM. At his last visit he was extremely orthostatic.   He will come in today for evaluation. Would recommend: - Rechecking orthostatics  - CBG  - May need admission for IVF and acute control of his DM. He seems very deconditioned

## 2018-08-07 NOTE — Progress Notes (Signed)
   CC: Follow-up dizziness  HPI:  Mr.Almond Roessner is a 58 y.o. with medical history of uncontrolled diabetes mellitus presenting for evaluation of dizziness.  Please see problem based charting for further details.  Past Medical History:  Diagnosis Date  . CHF (congestive heart failure) ([place])   . Critical lower limb ischemia, lt with ABI of 0.60 12/31/2011  . Gangrene ([place])    right foot  . GERD (gastroesophageal reflux disease)   . Hyperlipidemia   . Hypertension   . Neuromuscular disorder ([place])    diabetic neruopathy  . Osteomyelitis ([place]) 2010   left foot, s/p midfoot amputation  . 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 PAD (peripheral artery disease) ([place])    ABIs 11/30/11: L ABI 0.68, R ABI 0.84  . Pneumonia 2010  . PVD (peripheral vascular disease) ([place]) 12/31/2011  . S/P angioplasty with stent, 12/30/11, of Lt SFA, Post. tibialis and PTA of L. ant and post. tibial vessels 12/31/2011  . Type II diabetes mellitus ([place]) ~ 2002   Review of Systems: As per HPI  Physical Exam:  Vitals:   08/07/18 1437  Temp: 98.6 F (37 C)  TempSrc: Oral  SpO2: 100%  Weight: 189 lb 14.4 oz (86.1 kg)  Height: 6' (1.829 m)   Physical Exam Vitals signs reviewed.  Cardiovascular:     Rate and Rhythm: Normal rate.     Heart sounds: Normal heart sounds. No murmur.  Pulmonary:     Breath sounds: Normal breath sounds. No wheezing or rales.  Musculoskeletal:     Right lower leg: No edema.     Left lower leg: No edema.  Skin:    General: Skin is warm.  Neurological:     General: No focal deficit present.     Mental Status: He is alert and oriented to person, place, and time. Mental status is at baseline.     Cranial Nerves: No cranial nerve deficit.     Motor: No weakness.  Psychiatric:        Mood and Affect: Mood normal.     Assessment & Plan:   See Encounters Tab for problem based charting.   Patient discussed with Dr. Rebeca Alert

## 2018-08-08 ENCOUNTER — Telehealth: Payer: Self-pay | Admitting: *Deleted

## 2018-08-08 NOTE — Telephone Encounter (Signed)
Completed and signed Request for Independent Assessment for [place] [facility] faxed to [facility] at [phone].  Laurenze L Ducatte8/4/20202:09 PM

## 2018-08-08 NOTE — Telephone Encounter (Signed)
Sounds good. Thanks 

## 2018-08-08 NOTE — Telephone Encounter (Signed)
Pt calls and states he awoke during the night coughing and was unable to rest afterwards, he cannot recline because he feels like he is choking. He has no fever. No chest pain. Nothing other than horrible gurggly cough offered appt, refused, he does state he will see if his son can bring him at 1300 to urg care for eval. He is cautioned and is agreeable that if he feels worse he will call 911.

## 2018-08-10 NOTE — Progress Notes (Signed)
Internal Medicine Clinic Attending  Case discussed with Dr. Agyei at the time of the visit.  We reviewed the resident's history and exam and pertinent patient test results.  I agree with the assessment, diagnosis, and plan of care documented in the resident's note.  Alexander Raines, M.D., Ph.D.  

## 2018-08-17 ENCOUNTER — Ambulatory Visit (INDEPENDENT_AMBULATORY_CARE_PROVIDER_SITE_OTHER): Payer: Medicare Other | Admitting: Internal Medicine

## 2018-08-17 ENCOUNTER — Other Ambulatory Visit: Payer: Self-pay

## 2018-08-17 ENCOUNTER — Encounter: Payer: Self-pay | Admitting: Internal Medicine

## 2018-08-17 DIAGNOSIS — N529 Male erectile dysfunction, unspecified: Secondary | ICD-10-CM

## 2018-08-17 DIAGNOSIS — Z89511 Acquired absence of right leg below knee: Secondary | ICD-10-CM | POA: Diagnosis not present

## 2018-08-17 DIAGNOSIS — E118 Type 2 diabetes mellitus with unspecified complications: Secondary | ICD-10-CM | POA: Diagnosis not present

## 2018-08-17 DIAGNOSIS — I739 Peripheral vascular disease, unspecified: Secondary | ICD-10-CM

## 2018-08-17 DIAGNOSIS — E1151 Type 2 diabetes mellitus with diabetic peripheral angiopathy without gangrene: Secondary | ICD-10-CM

## 2018-08-17 DIAGNOSIS — IMO0002 Reserved for concepts with insufficient information to code with codable children: Secondary | ICD-10-CM

## 2018-08-17 DIAGNOSIS — Z794 Long term (current) use of insulin: Secondary | ICD-10-CM | POA: Diagnosis not present

## 2018-08-17 DIAGNOSIS — E1165 Type 2 diabetes mellitus with hyperglycemia: Secondary | ICD-10-CM

## 2018-08-17 MED ORDER — SILDENAFIL CITRATE 50 MG PO TABS: 50.0000 mg | ORAL_TABLET | Freq: Every day | ORAL | 0 refills | Status: DC | PRN

## 2018-08-17 MED ORDER — XULTOPHY 100-3.6 UNIT-MG/ML ~~LOC~~ SOPN: 30.0000 [IU] | PEN_INJECTOR | Freq: Every day | SUBCUTANEOUS | 2 refills | Status: DC

## 2018-08-17 NOTE — Assessment & Plan Note (Signed)
HPI:  Patient is s/p BKA of the right lower extremity in 2015 secondary to peripheral vascular disease. Since that time he has used a prosthetic to ambulate. Over the last couple months he has lost weight and his prosthetic is no longer fitting well. He states that he continuously rubs him and is now affecting his ambulation. Sometimes while he is walking his prosthetic falls off. He denies any new wounds or sores.  A/P: - Referral for new prosthetic

## 2018-08-17 NOTE — Addendum Note (Signed)
Addended by: Ina Homes T on: 08/17/2018 03:55 PM   Modules accepted: Orders

## 2018-08-17 NOTE — Assessment & Plan Note (Signed)
HPI:  Patient with uncontrolled type II diabetes. At his last visit with me he was having severe hyperglycemia with polyuria, polydipsia, fatigue, orthostatic hypotension. We discussed the importance of controlling his blood sugar. He was discharged home and followed up in the [facility] one week later. He was continuing to have difficulty controlling his blood sugars and offered admission however he declined.  Today tells me that he is feeling much better and thinks that his CBG's are much better control. He is not been checking his sugars because he does not have his meter at home with him. He has been increasing his Xultophy and is now at 30 units daily. He intermittently takes his NovoLog. He tells me that he does consistently take his metformin. Overall he says that his polyuria and polydipsia have improved. His energy is starting to improve and this past week he has been able to walk around the house without as much fatigue. He states that he was able to sleep all through the night last night without experiencing nocturia. He is now denying orthostatic symptoms.  A/P: - Continue to stress the importance of DM control and checking his CBG - Continue Xultophy 30 units QD  - Continue Novolog 7 units TID WC  - Continue metformin 1000 mg BID

## 2018-08-17 NOTE — Progress Notes (Signed)
   CC: F/u right BKA and DM  This is a telephone encounter between [name] and [facility] on 08/17/2018 for f/u right BKA and DM. The visit was conducted with the patient located at home and [facility]. The patient's identity was confirmed using their DOB and current address. The patient has consented to being evaluated through a telephone encounter and understands the associated risks (an examination cannot be done and the patient may need to come in for an appointment) / benefits (allows the patient to remain at home, decreasing exposure to coronavirus). I personally spent 12 minutes on medical discussion.   HPI:  [name] is a 58 y.o. with PMH as below.   Please see A&P for assessment of the patient's acute and chronic medical conditions.   Past Medical History:  Diagnosis Date  . CHF (congestive heart failure) ([place])   . Critical lower limb ischemia, lt with ABI of 0.60 12/31/2011  . Gangrene ([place])    right foot  . GERD (gastroesophageal reflux disease)   . Hyperlipidemia   . Hypertension   . Neuromuscular disorder ([place])    diabetic neruopathy  . Osteomyelitis ([place]) 2010   left foot, s/p midfoot amputation  . 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 PAD (peripheral artery disease) ([place])    ABIs 11/30/11: L ABI 0.68, R ABI 0.84  . Pneumonia 2010  . PVD (peripheral vascular disease) ([place]) 12/31/2011  . S/P angioplasty with stent, 12/30/11, of Lt SFA, Post. tibialis and PTA of L. ant and post. tibial vessels 12/31/2011  . Type II diabetes mellitus ([place]) ~ 2002   Review of Systems:  Performed and all others negative.  Assessment & Plan:   See Encounters Tab for problem based charting.  Patient discussed with Dr. Evette Doffing

## 2018-08-18 NOTE — Progress Notes (Signed)
Internal Medicine Clinic Attending  Case discussed with Dr. Tarri Abernethy  soon after the resident saw the patient.  We reviewed the resident's history and exam and pertinent patient test results.  I agree with the assessment, diagnosis, and plan of care documented in the resident's note.

## 2018-08-29 MED FILL — SILDENAFIL CITRATE 50 MG TA: 50 | 30 days supply | Qty: 4 | Fill #0

## 2018-08-31 NOTE — Addendum Note (Signed)
Addended by: Ina Homes T on: 08/31/2018 03:45 PM   Modules accepted: Orders

## 2018-09-04 ENCOUNTER — Telehealth: Payer: Self-pay

## 2018-09-04 NOTE — Telephone Encounter (Signed)
Information faxed to [employer] for Prosthetics supplies [place], [place] C8/31/20208:25 AM

## 2018-09-27 ENCOUNTER — Encounter: Payer: Self-pay | Admitting: *Deleted

## 2018-09-27 NOTE — Progress Notes (Signed)
Things That May Be Affecting Your Health:  Alcohol  Hearing loss  Pain    Depression  Home Safety  Sexual Health  XX Diabetes X Lack of physical activity  Stress  X Difficulty with daily activities  Loneliness X Tiredness   Drug use  Medicines  Tobacco use   Falls  Motor Vehicle Safety  Weight   Food choices  Oral Health  Other    YOUR PERSONALIZED HEALTH PLAN : 1. Schedule your next subsequent Medicare Wellness visit in one year 2. Attend all of your regular appointments to address your medical issues 3. Complete the preventative screenings and services   Annual Wellness Visit   Medicare Covered Preventative Screenings and [place] Men and Women Who How Often Need? Date of Last Service Action  Abdominal Aortic Aneurysm Adults with AAA risk factors Once     Alcohol Misuse and Counseling All Adults Screening once a year if no alcohol misuse. Counseling up to 4 face to face sessions.     Bone Density Measurement  Adults at risk for osteoporosis Once every 2 yrs     Lipid Panel Z13.6 All adults without CV disease Once every 5 yrs     Colorectal Cancer   Stool sample or  Colonoscopy All adults 88 and older   Once every year  Every 10 years     Depression All Adults Once a year  Today   Diabetes Screening Blood glucose, post glucose load, or GTT Z13.1  All adults at risk  Pre-diabetics  Once per year  Twice per year     Diabetes  Self-Management Training All adults Diabetics 10 hrs first year; 2 hours subsequent years. Requires Copay     Glaucoma  Diabetics  Family history of glaucoma  African Americans 25 yrs +  Hispanic Americans 62 yrs + Annually - requires coppay     Hepatitis C Z72.89 or F19.20  High Risk for HCV  Born between 1945 and 1965  Annually  Once     HIV Z11.4 All adults based on risk  Annually btw ages 18 & 25 regardless of risk  Annually > 65 yrs if at increased risk     Lung Cancer Screening Asymptomatic adults  aged 77-77 with 30 pack yr history and current smoker OR quit within the last 15 yrs Annually Must have counseling and shared decision making documentation before first screen     Medical Nutrition Therapy Adults with   Diabetes  Renal disease  Kidney transplant within past 3 yrs 3 hours first year; 2 hours subsequent years     Obesity and Counseling All adults Screening once a year Counseling if BMI 30 or higher  Today   Tobacco Use Counseling Adults who use tobacco  Up to 8 visits in one year     Vaccines Z23  Hepatitis B  Influenza   Pneumonia  Adults   Once  Once every flu season  Two different vaccines separated by one year     Next Annual Wellness Visit People with Medicare Every year  Today     Services & Screenings Women Who How Often Need  Date of Last Service Action  Mammogram  Z12.31 Women over 4 One baseline ages 31-39. Annually ager 40 yrs+     Pap tests All women Annually if high risk. Every 2 yrs for normal risk women     Screening for cervical cancer with   Pap (Z01.419 nl or Z01.411abnl) &  HPV Z11.51 Women aged 2 to 84 Once every 5 yrs     Screening pelvic and breast exams All women Annually if high risk. Every 2 yrs for normal risk women     Sexually Transmitted Diseases  Chlamydia  Gonorrhea  Syphilis All at risk adults Annually for non pregnant females at increased risk      [place] Men Who How Ofter Need  Date of Last Service Action  Prostate Cancer - DRE & PSA Men over 50 Annually.  DRE might require a copay.     Sexually Transmitted Diseases  Syphilis All at risk adults Annually for men at increased risk

## 2018-09-27 NOTE — Progress Notes (Signed)

## 2018-09-27 NOTE — Progress Notes (Signed)
Done. Thank you.

## 2018-10-23 ENCOUNTER — Telehealth: Payer: Self-pay | Admitting: Internal Medicine

## 2018-10-23 NOTE — Telephone Encounter (Signed)
Pt having questions regarding metformin [phone]

## 2018-10-23 NOTE — Telephone Encounter (Signed)
rtc to pt, lm for rtc 

## 2018-10-24 NOTE — Telephone Encounter (Signed)
Lm for rtc 

## 2018-10-26 ENCOUNTER — Ambulatory Visit (INDEPENDENT_AMBULATORY_CARE_PROVIDER_SITE_OTHER): Payer: Medicare Other | Admitting: Internal Medicine

## 2018-10-26 ENCOUNTER — Other Ambulatory Visit: Payer: Self-pay

## 2018-10-26 VITALS — BP 131/86 | HR 94 | Temp 98.2°F | Ht 72.0 in | Wt 184.4 lb

## 2018-10-26 DIAGNOSIS — E1122 Type 2 diabetes mellitus with diabetic chronic kidney disease: Secondary | ICD-10-CM | POA: Diagnosis not present

## 2018-10-26 DIAGNOSIS — E1151 Type 2 diabetes mellitus with diabetic peripheral angiopathy without gangrene: Secondary | ICD-10-CM

## 2018-10-26 DIAGNOSIS — IMO0002 Reserved for concepts with insufficient information to code with codable children: Secondary | ICD-10-CM

## 2018-10-26 DIAGNOSIS — R05 Cough: Secondary | ICD-10-CM

## 2018-10-26 DIAGNOSIS — E1165 Type 2 diabetes mellitus with hyperglycemia: Secondary | ICD-10-CM | POA: Diagnosis not present

## 2018-10-26 DIAGNOSIS — Z89612 Acquired absence of left leg above knee: Secondary | ICD-10-CM

## 2018-10-26 DIAGNOSIS — E118 Type 2 diabetes mellitus with unspecified complications: Secondary | ICD-10-CM | POA: Diagnosis not present

## 2018-10-26 DIAGNOSIS — Z23 Encounter for immunization: Secondary | ICD-10-CM | POA: Diagnosis not present

## 2018-10-26 DIAGNOSIS — E1142 Type 2 diabetes mellitus with diabetic polyneuropathy: Secondary | ICD-10-CM

## 2018-10-26 DIAGNOSIS — I129 Hypertensive chronic kidney disease with stage 1 through stage 4 chronic kidney disease, or unspecified chronic kidney disease: Secondary | ICD-10-CM

## 2018-10-26 DIAGNOSIS — Z79899 Other long term (current) drug therapy: Secondary | ICD-10-CM

## 2018-10-26 DIAGNOSIS — Z79891 Long term (current) use of opiate analgesic: Secondary | ICD-10-CM

## 2018-10-26 DIAGNOSIS — R053 Chronic cough: Secondary | ICD-10-CM

## 2018-10-26 DIAGNOSIS — Z89421 Acquired absence of other right toe(s): Secondary | ICD-10-CM

## 2018-10-26 DIAGNOSIS — N1832 Chronic kidney disease, stage 3b: Secondary | ICD-10-CM

## 2018-10-26 DIAGNOSIS — Z0000 Encounter for general adult medical examination without abnormal findings: Secondary | ICD-10-CM

## 2018-10-26 DIAGNOSIS — N183 Chronic kidney disease, stage 3 unspecified: Secondary | ICD-10-CM | POA: Diagnosis not present

## 2018-10-26 DIAGNOSIS — I10 Essential (primary) hypertension: Secondary | ICD-10-CM

## 2018-10-26 LAB — POCT GLYCOSYLATED HEMOGLOBIN (HGB A1C): Hemoglobin A1C: 10.4 % — AB (ref 4.0–5.6)

## 2018-10-26 LAB — GLUCOSE, CAPILLARY: Glucose-Capillary: 240 mg/dL — ABNORMAL HIGH (ref 70–99)

## 2018-10-26 NOTE — Patient Instructions (Addendum)
Thank you for allowing me to provide your care. We really need to do a better job with your diabetes. Today we are can increase her long acting insulin (Xultophy) to 36 units daily and your NovoLog to 11 units three times a day with meals.  For your cough you can start taking and antihistamines such as Zyrtec, Claritin, or Benadryl. We will also refer you on to pulmonology for further evaluation of your pleural effusion.  Your blood pressure looks great. Please continue to take all your medications as prescribed.  Please come back to see me in three months or sooner if any issues arise.

## 2018-10-26 NOTE — Progress Notes (Signed)
   CC: HTN, DM, Cough, peripheral neuropathy   HPI:  [name] is a 58 y.o. male with PMHx listed below presenting for HTN, DM, Cough, peripheral neuropathy. Please see the A&P for the status of the patient's chronic medical problems.  Past Medical History:  Diagnosis Date  . CHF (congestive heart failure) ([place])   . Critical lower limb ischemia, lt with ABI of 0.60 12/31/2011  . Gangrene ([place])    right foot  . GERD (gastroesophageal reflux disease)   . Hyperlipidemia   . Hypertension   . Neuromuscular disorder ([place])    diabetic neruopathy  . Osteomyelitis ([place]) 2010   left foot, s/p midfoot amputation  . 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 PAD (peripheral artery disease) ([place])    ABIs 11/30/11: L ABI 0.68, R ABI 0.84  . Pneumonia 2010  . PVD (peripheral vascular disease) ([place]) 12/31/2011  . S/P angioplasty with stent, 12/30/11, of Lt SFA, Post. tibialis and PTA of L. ant and post. tibial vessels 12/31/2011  . Type II diabetes mellitus ([place]) ~ 2002   Review of Systems:  Performed and all others negative.  Physical Exam: Vitals:   10/26/18 1336  BP: 131/86  Pulse: 94  Temp: 98.2 F (36.8 C)  TempSrc: Oral  SpO2: 93%  Weight: 184 lb 6.4 oz (83.6 kg)  Height: 6' (1.829 m)   General: Thin male in no acute distress Pulm: Good air movement with no wheezing or crackles  CV: RRR, no murmurs, no rubs   Assessment & Plan:   See Encounters Tab for problem based charting.  Patient discussed with Dr. Daryll Drown

## 2018-10-27 DIAGNOSIS — N183 Chronic kidney disease, stage 3 unspecified: Secondary | ICD-10-CM | POA: Insufficient documentation

## 2018-10-27 DIAGNOSIS — E1122 Type 2 diabetes mellitus with diabetic chronic kidney disease: Secondary | ICD-10-CM | POA: Insufficient documentation

## 2018-10-27 LAB — BMP8+ANION GAP
Anion Gap: 16 mmol/L (ref 10.0–18.0)
BUN/Creatinine Ratio: 8 — ABNORMAL LOW (ref 9–20)
BUN: 20 mg/dL (ref 6–24)
CO2: 21 mmol/L (ref 20–29)
Calcium: 8.9 mg/dL (ref 8.7–10.2)
Chloride: 98 mmol/L (ref 96–106)
Creatinine, Ser: 2.46 mg/dL — ABNORMAL HIGH (ref 0.76–1.27)
GFR calc Af Amer: 32 mL/min/{1.73_m2} — ABNORMAL LOW (ref >59)
GFR calc non Af Amer: 28 mL/min/{1.73_m2} — ABNORMAL LOW (ref >59)
Glucose: 262 mg/dL — ABNORMAL HIGH (ref 65–99)
Potassium: 4.8 mmol/L (ref 3.5–5.2)
Sodium: 135 mmol/L (ref 134–144)

## 2018-10-27 NOTE — Assessment & Plan Note (Signed)
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 He is currently on Degludec-Liraglutide 30 units QHS and NovoLog 7 units TID with meals. He states that he misses his short acting insulin 1 to 2 times per week. He only checks his blood sugars in the morning and states that when he wakes up there typically 150 to 160. He has not really modified his diet and eats whenever his son and daughter bring to him. He does not exercise daily. He states that he stores his insulin in the fridge. He rotates his injection site. When injecting he presses the button and holds it in the skin for at least five seconds. He never sees insulin coming out of the needle after withdrawing the needle from his skin. He states that he does have polyuria and polydipsia.  A/P: - Repeat A1c today increased from 9.1 to 10.4. We discussed the implications of uncontrolled diabetes. - Avoid SGLT-2 inhibitors due to amputations  - Increase Degludec-Liraglutide to 36 units QHS and Novolog to 11 units TID WC - Follow-up in 3 months for continue management.

## 2018-10-27 NOTE — Assessment & Plan Note (Signed)
Patient with known CKD stage III - IV secondary to his uncontrolled diabetes. Repeat BMP today consistent with prior. He is currently on lisinopril 5 mg daily. Discussed the importance of better glucose control to prevent micro vascular progression.

## 2018-10-27 NOTE — Assessment & Plan Note (Signed)
Patient with well-controlled hypertension. He is currently on amlodipine 10 mg daily, carvedilol 6.25 mg twice daily, and lisinopril 5 mg daily. He is taking all these medications as prescribed. He states that he does occasionally feel dizzy whenever he goes to stand up. He otherwise is not been having any side effects from his medications and no difficulty affording these medications.  A/P: - Well controlled  - Orthostatic vitals checked today and were negative  - Continue amlodipine 10 mg daily, carvedilol 6.25 mg twice daily, and lisinopril 5 mg daily

## 2018-10-27 NOTE — Assessment & Plan Note (Signed)
Patient to get his flu shot today. Also referred for diabetic eye exam and colonoscopy for colon cancer screening.

## 2018-10-27 NOTE — Assessment & Plan Note (Signed)
Patient with chronic cough. This is been worked up. He has what appears to be a chronic right side loculated effusion on x-ray and imaging of his chest. This is been thoroughly evaluated by other specialties however it has never been tapped. For his chronic cough he is on Nexium 40 mg daily and Flonase. We discussed adding an antihistamine. We will refer him to pulmonology.  A/P: - Continue Nexium 40 mg daily and Flonase one spray each nare daily. - Referral to pulmonology

## 2018-10-27 NOTE — Assessment & Plan Note (Signed)
Patient with severe diabetic peripheral neuropathy. Currently on duloxetine 60 mg daily, gabapentin 800 mg TID, and tramadol 50 mg twice daily. He states that his neuropathy is well-controlled at this time. He is taking all his medications as prescribed without any apparent side effects.  A/P: - Well controlled  - Continue duloxetine 60 mg daily, gabapentin 800 mg TID, and tramadol 50 mg twice daily

## 2018-11-01 ENCOUNTER — Encounter: Payer: Self-pay | Admitting: Gastroenterology

## 2018-11-01 ENCOUNTER — Other Ambulatory Visit: Payer: Self-pay

## 2018-11-01 ENCOUNTER — Ambulatory Visit: Payer: Medicare Other | Admitting: Internal Medicine

## 2018-11-01 DIAGNOSIS — Z0000 Encounter for general adult medical examination without abnormal findings: Secondary | ICD-10-CM

## 2018-11-02 NOTE — Progress Notes (Signed)
Patient did not show up for his annual wellness visit. Chart will be closed with no charge.

## 2018-11-02 NOTE — Progress Notes (Signed)
Internal Medicine Clinic Attending  Case discussed with Dr. Helberg at the time of the visit.  We reviewed the resident's history and exam and pertinent patient test results.  I agree with the assessment, diagnosis, and plan of care documented in the resident's note.    

## 2018-11-17 ENCOUNTER — Other Ambulatory Visit: Payer: Self-pay

## 2018-11-17 NOTE — Patient Outreach (Signed)
[facility]) Care Management  11/17/2018  Elison Worrel 1960-02-09 [id number]   Medication Adherence call to Mr. Sacha Radloff HIPPA Compliant Voice message left with a call back number. Mr. Bail is showing past due on Lisinopril 5 mg under [place].   [place] Management Direct Dial [phone]  Fax [phone] [email or web address]

## 2018-11-21 ENCOUNTER — Other Ambulatory Visit: Payer: Self-pay | Admitting: Internal Medicine

## 2018-11-21 DIAGNOSIS — E1142 Type 2 diabetes mellitus with diabetic polyneuropathy: Secondary | ICD-10-CM

## 2018-11-21 MED FILL — GABAPENTIN 800 MG TABS: 800 | 30 days supply | Qty: 90 | Fill #1

## 2018-11-21 NOTE — Telephone Encounter (Signed)
traMADol (ULTRAM) 50 MG tablet   Refill request @  [facility], [place] [facility] [place] [phone] (Phone) [phone] (Fax)

## 2018-11-22 MED FILL — traMADol HCL 50 MG TABS: 50 | 23 days supply | Qty: 46 | Fill #0

## 2018-11-27 ENCOUNTER — Ambulatory Visit (AMBULATORY_SURGERY_CENTER): Payer: Medicare Other | Admitting: *Deleted

## 2018-11-27 ENCOUNTER — Other Ambulatory Visit: Payer: Self-pay

## 2018-11-27 VITALS — Ht 72.0 in | Wt 182.0 lb

## 2018-11-27 DIAGNOSIS — Z1211 Encounter for screening for malignant neoplasm of colon: Secondary | ICD-10-CM

## 2018-11-27 DIAGNOSIS — Z1159 Encounter for screening for other viral diseases: Secondary | ICD-10-CM

## 2018-11-27 MED ORDER — SUPREP BOWEL PREP KIT 17.5-3.13-1.6 GM/177ML PO SOLN: 1.0000 | Freq: Once | ORAL | 0 refills | Status: AC

## 2018-11-27 NOTE — Progress Notes (Signed)
Patient denies any allergies to egg or soy products. Patient denies complications with anesthesia/sedation.  Patient denies oxygen use at home and denies diet medications. Emmi instructions for colonoscopy/endoscopy explained and given to patient.   

## 2018-11-28 ENCOUNTER — Encounter: Payer: Self-pay | Admitting: Gastroenterology

## 2018-12-05 MED FILL — traMADol HCL 50 MG TABS: 50 | 23 days supply | Qty: 46 | Fill #0

## 2018-12-05 MED FILL — GABAPENTIN 800 MG TABS: 800 | 30 days supply | Qty: 90 | Fill #0

## 2018-12-06 ENCOUNTER — Ambulatory Visit (INDEPENDENT_AMBULATORY_CARE_PROVIDER_SITE_OTHER): Payer: Medicare Other

## 2018-12-06 ENCOUNTER — Other Ambulatory Visit: Payer: Self-pay | Admitting: Gastroenterology

## 2018-12-06 DIAGNOSIS — Z1159 Encounter for screening for other viral diseases: Secondary | ICD-10-CM

## 2018-12-07 LAB — SARS CORONAVIRUS 2 (TAT 6-24 HRS): SARS Coronavirus 2: NEGATIVE

## 2018-12-10 ENCOUNTER — Telehealth: Payer: Self-pay | Admitting: Gastroenterology

## 2018-12-10 MED ORDER — NA SULFATE-K SULFATE-MG SULF 17.5-3.13-1.6 GM/177ML PO SOLN: 1.0000 | Freq: Once | ORAL | 0 refills | Status: AC

## 2018-12-10 NOTE — Telephone Encounter (Signed)
Patient called back - having insurance issues. Did not have insurance information at the pharmacy. I relayed to them what I have in the system and they still could not process it. I counseled him on how to a Miralax and Gatorade bowel prep with instructions and they will use that for his prep tomorrow if they can't get the Suprep. All questions answered.

## 2018-12-10 NOTE — Telephone Encounter (Signed)
Patient's nephew, Saul Fordyce, called stating he accidentally threw away his uncle's bowel prep that was in a brown paper bag in the refrigerator.  Asked if new prescription could be called into the pharmacy as he has colonoscopy with Dr. Loletha Carrow tomorrow.  He was contacted back at [phone].  Prescription for Suprep sent to [place] at [facility] in [place].

## 2018-12-11 ENCOUNTER — Ambulatory Visit (AMBULATORY_SURGERY_CENTER): Payer: Medicare Other | Admitting: Gastroenterology

## 2018-12-11 ENCOUNTER — Encounter: Payer: Self-pay | Admitting: Gastroenterology

## 2018-12-11 ENCOUNTER — Other Ambulatory Visit: Payer: Self-pay

## 2018-12-11 VITALS — BP 153/81 | HR 83 | Temp 98.1°F | Resp 17 | Ht 72.0 in | Wt 182.0 lb

## 2018-12-11 DIAGNOSIS — D125 Benign neoplasm of sigmoid colon: Secondary | ICD-10-CM

## 2018-12-11 DIAGNOSIS — D122 Benign neoplasm of ascending colon: Secondary | ICD-10-CM | POA: Diagnosis not present

## 2018-12-11 DIAGNOSIS — Z1211 Encounter for screening for malignant neoplasm of colon: Secondary | ICD-10-CM

## 2018-12-11 DIAGNOSIS — D123 Benign neoplasm of transverse colon: Secondary | ICD-10-CM

## 2018-12-11 MED ORDER — SODIUM CHLORIDE 0.9 % IV SOLN: 500.0000 mL | Freq: Once | INTRAVENOUS | Status: DC

## 2018-12-11 NOTE — Patient Instructions (Signed)
Please read handouts provided. Continue present medications. Await pathology results. Recommend repeating colonoscopy in 1 year.      YOU HAD AN ENDOSCOPIC PROCEDURE TODAY AT [facility]:   Refer to the procedure report that was given to you for any specific questions about what was found during the examination.  If the procedure report does not answer your questions, please call your gastroenterologist to clarify.  If you requested that your care partner not be given the details of your procedure findings, then the procedure report has been included in a sealed envelope for you to review at your convenience later.  YOU SHOULD EXPECT: Some feelings of bloating in the abdomen. Passage of more gas than usual.  Walking can help get rid of the air that was put into your GI tract during the procedure and reduce the bloating. If you had a lower endoscopy (such as a colonoscopy or flexible sigmoidoscopy) you may notice spotting of blood in your stool or on the toilet paper. If you underwent a bowel prep for your procedure, you may not have a normal bowel movement for a few days.  Please Note:  You might notice some irritation and congestion in your nose or some drainage.  This is from the oxygen used during your procedure.  There is no need for concern and it should clear up in a day or so.  SYMPTOMS TO REPORT IMMEDIATELY:   Following lower endoscopy (colonoscopy or flexible sigmoidoscopy):  Excessive amounts of blood in the stool  Significant tenderness or worsening of abdominal pains  Swelling of the abdomen that is new, acute  Fever of 100F or higher   For urgent or emergent issues, a gastroenterologist can be reached at any hour by calling [phone].   DIET:  We do recommend a small meal at first, but then you may proceed to your regular diet.  Drink plenty of fluids but you should avoid alcoholic beverages for 24 hours.  ACTIVITY:  You should plan to take it easy  for the rest of today and you should NOT DRIVE or use heavy machinery until tomorrow (because of the sedation medicines used during the test).    FOLLOW UP: Our staff will call the number listed on your records 48-72 hours following your procedure to check on you and address any questions or concerns that you may have regarding the information given to you following your procedure. If we do not reach you, we will leave a message.  We will attempt to reach you two times.  During this call, we will ask if you have developed any symptoms of COVID 19. If you develop any symptoms (ie: fever, flu-like symptoms, shortness of breath, cough etc.) before then, please call [phone].  If you test positive for Covid 19 in the 2 weeks post procedure, please call and report this information to [place].    If any biopsies were taken you will be contacted by phone or by letter within the next 1-3 weeks.  Please call us at [phone] if you have not heard about the biopsies in 3 weeks.    SIGNATURES/CONFIDENTIALITY: You and/or your care partner have signed paperwork which will be entered into your electronic medical record.  These signatures attest to the fact that that the information above on your After Visit Summary has been reviewed and is understood.  Full responsibility of the confidentiality of this discharge information lies with you and/or your care-partner.

## 2018-12-11 NOTE — Progress Notes (Signed)
PT taken to PACU. Monitors in place. VSS. Report given to RN. 

## 2018-12-11 NOTE — Op Note (Signed)
[place] Patient Name: [name] Procedure Date: 12/11/2018 9:08 AM MRN: [id number] Endoscopist: [place]. Loletha Carrow , MD Age: 58 Referring MD:  Date of Birth: January 08, 1960 Gender: Male Account #: [id number] Procedure:                Colonoscopy Indications:              Screening for colorectal malignant neoplasm, This                            is the patient's first colonoscopy Medicines:                Monitored Anesthesia Care Procedure:                Pre-Anesthesia Assessment:                           - Prior to the procedure, a History and Physical                            was performed, and patient medications and                            allergies were reviewed. The patient's tolerance of                            previous anesthesia was also reviewed. The risks                            and benefits of the procedure and the sedation                            options and risks were discussed with the patient.                            All questions were answered, and informed consent                            was obtained. Prior Anticoagulants: The patient has                            taken no previous anticoagulant or antiplatelet                            agents. ASA Grade Assessment: III - A patient with                            severe systemic disease. After reviewing the risks                            and benefits, the patient was deemed in                            satisfactory condition to undergo the procedure.  After obtaining informed consent, the colonoscope                            was passed under direct vision. Throughout the                            procedure, the patient's blood pressure, pulse, and                            oxygen saturations were monitored continuously. The                            Colonoscope was introduced through the anus and                            advanced to the the cecum,  identified by                            appendiceal orifice and ileocecal valve. The                            colonoscopy was somewhat difficult due to poor                            bowel prep. Successful completion of the procedure                            was aided by lavage. The patient tolerated the                            procedure well. The quality of the bowel                            preparation was poor. The ileocecal valve,                            appendiceal orifice, and rectum were photographed.                            The bowel preparation used was Miralax. Scope In: 9:13:43 AM Scope Out: 9:33:40 AM Scope Withdrawal Time: 0 hours 14 minutes 54 seconds  Total Procedure Duration: 0 hours 19 minutes 57 seconds  Findings:                 The perianal and digital rectal examinations were                            normal.                           Four sessile polyps were found in the transverse                            colon and ascending colon. The polyps were 4 to 5  mm in size. These polyps were removed with a cold                            snare. Resection and retrieval were complete.                           A 10 mm polyp was found in the sigmoid colon. The                            polyp was pedunculated. The polyp was removed with                            a hot snare. Resection and retrieval were complete.                           Retroflexion in the rectum was not performed.                           The exam was otherwise without abnormality. Complications:            No immediate complications. Estimated Blood Loss:     Estimated blood loss was minimal. Impression:               - Preparation of the colon was poor.                           - Four 4 to 5 mm polyps in the transverse colon and                            in the ascending colon, removed with a cold snare.                            Resected and  retrieved.                           - One 10 mm polyp in the sigmoid colon, removed                            with a hot snare. Resected and retrieved.                           - The examination was otherwise normal. Recommendation:           - Patient has a contact number available for                            emergencies. The signs and symptoms of potential                            delayed complications were discussed with the                            patient. Return to normal activities tomorrow.  Written discharge instructions were provided to the                            patient.                           - Resume previous diet.                           - Continue present medications.                           - Await pathology results.                           - Repeat colonoscopy in 1 year for surveillance due                            to poor prep (NEEDS 2-DAY PREP FOR NEXT EXAM). Cabe Lashley L. Loletha Carrow, MD 12/11/2018 9:41:34 AM This report has been signed electronically.

## 2018-12-11 NOTE — Progress Notes (Signed)
Temperature taken by J.B., VS taken by C.B. 

## 2018-12-11 NOTE — Progress Notes (Signed)
Called to room to assist during endoscopic procedure.  Patient ID and intended procedure confirmed with present staff. Received instructions for my participation in the procedure from the performing physician.  

## 2018-12-11 NOTE — Progress Notes (Signed)
Pt's states no medical or surgical changes since previsit or office visit. 

## 2018-12-12 ENCOUNTER — Telehealth: Payer: Self-pay | Admitting: Internal Medicine

## 2018-12-12 NOTE — Telephone Encounter (Signed)
In error

## 2018-12-13 ENCOUNTER — Telehealth: Payer: Self-pay

## 2018-12-13 NOTE — Telephone Encounter (Signed)
First  Post procedure follow up call, no answer

## 2018-12-13 NOTE — Telephone Encounter (Signed)
Second post procedure follow up call, no answer 

## 2018-12-14 ENCOUNTER — Encounter: Payer: Self-pay | Admitting: Gastroenterology

## 2019-02-13 ENCOUNTER — Other Ambulatory Visit: Payer: Self-pay

## 2019-02-13 DIAGNOSIS — E1165 Type 2 diabetes mellitus with hyperglycemia: Secondary | ICD-10-CM

## 2019-02-13 DIAGNOSIS — IMO0002 Reserved for concepts with insufficient information to code with codable children: Secondary | ICD-10-CM

## 2019-02-13 DIAGNOSIS — E1142 Type 2 diabetes mellitus with diabetic polyneuropathy: Secondary | ICD-10-CM

## 2019-02-13 NOTE — Telephone Encounter (Signed)
Please clarify dosage on Novolog and Xultophy Rx's; per your LOV, dosages were increased.  LOV 10/26/18  Thank you, SChaplin, RN,BSN

## 2019-02-13 NOTE — Telephone Encounter (Signed)
insulin aspart (NOVOLOG) 100 UNIT/ML FlexPen    Insulin Degludec-Liraglutide (XULTOPHY) 100-3.6 UNIT-MG/ML SOPN   Insulin Pen Needle 31G X 5 MM MISC  gabapentin (NEURONTIN) 800 MG tablet  traMADol (ULTRAM) 50 MG tablet, REFILL REQUEST @  [place], [place] [facility] [place] [phone] (Phone) [phone] (Fax)

## 2019-02-27 MED ORDER — TRAMADOL HCL 50 MG PO TABS: 50.0000 mg | ORAL_TABLET | Freq: Two times a day (BID) | ORAL | 0 refills | Status: DC | PRN

## 2019-02-27 MED ORDER — XULTOPHY 100-3.6 UNIT-MG/ML ~~LOC~~ SOPN: 36.0000 [IU] | PEN_INJECTOR | Freq: Every day | SUBCUTANEOUS | 2 refills | Status: DC

## 2019-02-27 MED ORDER — INSULIN ASPART 100 UNIT/ML FLEXPEN: 11.0000 [IU] | PEN_INJECTOR | Freq: Three times a day (TID) | SUBCUTANEOUS | 2 refills | Status: DC

## 2019-02-27 MED ORDER — INSULIN PEN NEEDLE 31G X 5 MM MISC: 2 refills | Status: DC

## 2019-02-27 MED ORDER — GABAPENTIN 800 MG PO TABS: 800.0000 mg | ORAL_TABLET | Freq: Three times a day (TID) | ORAL | 2 refills | Status: DC

## 2019-02-27 MED FILL — GABAPENTIN 800 MG TABS: 800 | 30 days supply | Qty: 90 | Fill #0

## 2019-02-27 MED FILL — traMADol HCL 50 MG TABS: 50 | 23 days supply | Qty: 46 | Fill #0

## 2019-03-10 MED FILL — UNIFINE PENTIPS 31GX3/16: 31G X 5 MM | 75 days supply | Qty: 300 | Fill #0

## 2019-03-10 MED FILL — traMADol HCL 50 MG TABS: 50 | 7 days supply | Qty: 14 | Fill #0

## 2019-03-10 MED FILL — NOVOLOG FLEXPEN SYRINGE: 100 | 81 days supply | Qty: 27 | Fill #0

## 2019-03-10 MED FILL — GABAPENTIN 800 MG TABS: 800 | 30 days supply | Qty: 90 | Fill #0

## 2019-03-19 ENCOUNTER — Telehealth: Payer: Self-pay

## 2019-03-19 NOTE — Telephone Encounter (Signed)
Requesting to speak with a nurse about feeling dizzy and question about meds. Please call pt back.

## 2019-03-19 NOTE — Telephone Encounter (Signed)
Returned call to patient. States AM fasting blood sugars 102 and 107. Has not been taking xultophy as he said it wasn't at the Pharmacy when he went to pick up other meds. Xultophy 45 mL with 2 refills sent to [facility] outpatient on 02/27/2019. Patient states he has been feeling dizzy in the evenings. Offered ACC appt tomorrow. Patient declined as he needs time to find transportation. Appt given with PCP for 03/22/2019 at 2:15. Hubbard Hartshorn, BSN, RN-BC

## 2019-03-22 ENCOUNTER — Encounter: Payer: Medicare Other | Admitting: Internal Medicine

## 2019-03-30 ENCOUNTER — Emergency Department (HOSPITAL_COMMUNITY): Payer: Medicare HMO

## 2019-03-30 ENCOUNTER — Other Ambulatory Visit: Payer: Self-pay

## 2019-03-30 ENCOUNTER — Emergency Department (HOSPITAL_COMMUNITY)
Admission: EM | Admit: 2019-03-30 | Discharge: 2019-03-31 | Disposition: A | Discharge status: Home / Self Care | Payer: Medicare HMO | Attending: Emergency Medicine | Admitting: Emergency Medicine

## 2019-03-30 ENCOUNTER — Encounter (HOSPITAL_COMMUNITY): Payer: Self-pay

## 2019-03-30 DIAGNOSIS — Z794 Long term (current) use of insulin: Secondary | ICD-10-CM | POA: Insufficient documentation

## 2019-03-30 DIAGNOSIS — R9431 Abnormal electrocardiogram [ECG] [EKG]: Secondary | ICD-10-CM | POA: Diagnosis not present

## 2019-03-30 DIAGNOSIS — Z79899 Other long term (current) drug therapy: Secondary | ICD-10-CM | POA: Insufficient documentation

## 2019-03-30 DIAGNOSIS — N183 Chronic kidney disease, stage 3 unspecified: Secondary | ICD-10-CM | POA: Insufficient documentation

## 2019-03-30 DIAGNOSIS — I130 Hypertensive heart and chronic kidney disease with heart failure and stage 1 through stage 4 chronic kidney disease, or unspecified chronic kidney disease: Secondary | ICD-10-CM | POA: Insufficient documentation

## 2019-03-30 DIAGNOSIS — E1122 Type 2 diabetes mellitus with diabetic chronic kidney disease: Secondary | ICD-10-CM | POA: Insufficient documentation

## 2019-03-30 DIAGNOSIS — R42 Dizziness and giddiness: Secondary | ICD-10-CM

## 2019-03-30 DIAGNOSIS — R200 Anesthesia of skin: Secondary | ICD-10-CM | POA: Diagnosis not present

## 2019-03-30 DIAGNOSIS — I509 Heart failure, unspecified: Secondary | ICD-10-CM | POA: Insufficient documentation

## 2019-03-30 DIAGNOSIS — F17210 Nicotine dependence, cigarettes, uncomplicated: Secondary | ICD-10-CM | POA: Insufficient documentation

## 2019-03-30 LAB — CBC WITH DIFFERENTIAL/PLATELET
Abs Immature Granulocytes: 0.05 10*3/uL (ref 0.00–0.07)
Basophils Absolute: 0 10*3/uL (ref 0.0–0.1)
Basophils Relative: 0 %
Eosinophils Absolute: 0.1 10*3/uL (ref 0.0–0.5)
Eosinophils Relative: 1 %
HCT: 41.1 % (ref 39.0–52.0)
Hemoglobin: 14 g/dL (ref 13.0–17.0)
Immature Granulocytes: 1 %
Lymphocytes Relative: 21 %
Lymphs Abs: 2 10*3/uL (ref 0.7–4.0)
MCH: 29.6 pg (ref 26.0–34.0)
MCHC: 34.1 g/dL (ref 30.0–36.0)
MCV: 86.9 fL (ref 80.0–100.0)
Monocytes Absolute: 0.5 10*3/uL (ref 0.1–1.0)
Monocytes Relative: 5 %
Neutro Abs: 7 10*3/uL (ref 1.7–7.7)
Neutrophils Relative %: 72 %
Platelets: 289 10*3/uL (ref 150–400)
RBC: 4.73 MIL/uL (ref 4.22–5.81)
RDW: 11.5 % (ref 11.5–15.5)
WBC: 9.7 10*3/uL (ref 4.0–10.5)
nRBC: 0 % (ref 0.0–0.2)

## 2019-03-30 LAB — COMPREHENSIVE METABOLIC PANEL
ALT: 11 U/L (ref 0–44)
AST: 13 U/L — ABNORMAL LOW (ref 15–41)
Albumin: 3.3 g/dL — ABNORMAL LOW (ref 3.5–5.0)
Alkaline Phosphatase: 102 U/L (ref 38–126)
Anion gap: 8 (ref 5–15)
BUN: 22 mg/dL — ABNORMAL HIGH (ref 6–20)
CO2: 27 mmol/L (ref 22–32)
Calcium: 8.7 mg/dL — ABNORMAL LOW (ref 8.9–10.3)
Chloride: 96 mmol/L — ABNORMAL LOW (ref 98–111)
Creatinine, Ser: 2.74 mg/dL — ABNORMAL HIGH (ref 0.61–1.24)
GFR calc Af Amer: 28 mL/min — ABNORMAL LOW (ref >60)
GFR calc non Af Amer: 24 mL/min — ABNORMAL LOW (ref >60)
Glucose, Bld: 315 mg/dL — ABNORMAL HIGH (ref 70–99)
Potassium: 4.4 mmol/L (ref 3.5–5.1)
Sodium: 131 mmol/L — ABNORMAL LOW (ref 135–145)
Total Bilirubin: 0.7 mg/dL (ref 0.3–1.2)
Total Protein: 7.6 g/dL (ref 6.5–8.1)

## 2019-03-30 LAB — CBG MONITORING, ED: Glucose-Capillary: 328 mg/dL — ABNORMAL HIGH (ref 70–99)

## 2019-03-30 LAB — TROPONIN I (HIGH SENSITIVITY): Troponin I (High Sensitivity): 16 ng/L (ref <18)

## 2019-03-30 MED ORDER — SODIUM CHLORIDE 0.9 % IV SOLN: INTRAVENOUS | Status: DC

## 2019-03-30 NOTE — Discharge Instructions (Addendum)
Your EKG was different than it has been before in the past.  Follow-up in the clinic for this.  Return here if your dizziness gets worse

## 2019-03-30 NOTE — ED Triage Notes (Signed)
Pt c/o dizziness x3-4 months. Pt states he is having trouble walking. Pt states it feels like acid reflux when he is eating.

## 2019-03-30 NOTE — ED Notes (Signed)
Pt transported CT
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
## 2019-04-05 ENCOUNTER — Ambulatory Visit (INDEPENDENT_AMBULATORY_CARE_PROVIDER_SITE_OTHER): Payer: Medicare HMO | Admitting: Internal Medicine

## 2019-04-05 ENCOUNTER — Other Ambulatory Visit: Payer: Self-pay

## 2019-04-05 VITALS — BP 149/85 | HR 93 | Temp 98.2°F | Ht 72.0 in | Wt 171.5 lb

## 2019-04-05 DIAGNOSIS — E118 Type 2 diabetes mellitus with unspecified complications: Secondary | ICD-10-CM

## 2019-04-05 DIAGNOSIS — E1165 Type 2 diabetes mellitus with hyperglycemia: Secondary | ICD-10-CM | POA: Diagnosis not present

## 2019-04-05 DIAGNOSIS — R42 Dizziness and giddiness: Secondary | ICD-10-CM

## 2019-04-05 DIAGNOSIS — Z794 Long term (current) use of insulin: Secondary | ICD-10-CM | POA: Diagnosis not present

## 2019-04-05 DIAGNOSIS — IMO0002 Reserved for concepts with insufficient information to code with codable children: Secondary | ICD-10-CM

## 2019-04-05 LAB — POCT GLYCOSYLATED HEMOGLOBIN (HGB A1C): Hemoglobin A1C: 11.8 % — AB (ref 4.0–5.6)

## 2019-04-05 LAB — GLUCOSE, CAPILLARY: Glucose-Capillary: 390 mg/dL — ABNORMAL HIGH (ref 70–99)

## 2019-04-05 MED ORDER — ACCU-CHEK AVIVA PLUS VI STRP: ORAL_STRIP | 12 refills | Status: DC

## 2019-04-05 MED ORDER — ACCU-CHEK FASTCLIX LANCETS MISC: 12 refills | Status: DC

## 2019-04-05 MED ORDER — LANTUS SOLOSTAR 100 UNIT/ML ~~LOC~~ SOPN: 12.0000 [IU] | PEN_INJECTOR | Freq: Every day | SUBCUTANEOUS | 11 refills | Status: DC

## 2019-04-05 MED ORDER — INSULIN ASPART 100 UNIT/ML FLEXPEN: 5.0000 [IU] | PEN_INJECTOR | Freq: Three times a day (TID) | SUBCUTANEOUS | 2 refills | Status: DC

## 2019-04-05 MED ORDER — INSULIN PEN NEEDLE 31G X 5 MM MISC: 2 refills | Status: DC

## 2019-04-05 NOTE — Progress Notes (Signed)
   CC: DM  HPI:  [name] is a 59 y.o. male with PMHx listed below presenting for DM. Please see the A&P for the status of the patient's chronic medical problems.  Past Medical History:  Diagnosis Date  . CHF (congestive heart failure) ([place])   . Critical lower limb ischemia, lt with ABI of 0.60 12/31/2011  . Gangrene ([place])    right foot  . GERD (gastroesophageal reflux disease)   . Hyperlipidemia   . Hypertension   . Neuromuscular disorder ([place])    diabetic neruopathy - hands  . Osteomyelitis ([place]) 2010   left foot, s/p midfoot amputation  . Osteomyelitis ([place]) 09/2013   RT BKA  . 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 PAD (peripheral artery disease) ([place])    ABIs 11/30/11: L ABI 0.68, R ABI 0.84  . Pneumonia 2010  . PVD (peripheral vascular disease) ([place]) 12/31/2011  . S/P angioplasty with stent, 12/30/11, of Lt SFA, Post. tibialis and PTA of L. ant and post. tibial vessels 12/31/2011  . SOB (shortness of breath)    uses inhaler prn  . Type II diabetes mellitus ([place]) ~ 2002   Review of Systems:  Performed and all others negative.  Physical Exam: Vitals:   04/05/19 1519  BP: (!) 149/85  Pulse: 93  Temp: 98.2 F (36.8 C)  TempSrc: Oral  SpO2: 100%  Weight: 171 lb 8 oz (77.8 kg)  Height: 6' (1.829 m)   General: Well nourished male in no acute distress Pulm: Good air movement with no wheezing or crackles  CV: RRR, no murmurs, no rubs   Assessment & Plan:   See Encounters Tab for problem based charting.  Patient discussed with Dr. Evette Doffing

## 2019-04-05 NOTE — Patient Instructions (Signed)
Thank you for allowing me to provide your care. Today we're restarting your insulin. It is important that we control your blood sugars. We are not restarting her blood pressure medications at this time. Please try to stay hydrated. If with controlling your blood sugars her symptoms persist please come back to see [place].  I would like you to come back in four weeks for continued escalation in your medications.

## 2019-04-06 NOTE — Assessment & Plan Note (Signed)
Patient presents with multiple complaints including what sounds like orthostatic hypotension. He has not been taking any of his medications for the last couple months and does not check his blood sugars. He has multiple complications from his diabetes include amputations. A1c is >11.   We discuss the repercussions of uncontrolled DM and that this is likely causing many of his symptoms. I worry about insight to his DM. We will start Novolog 5 units TID WC and Lantus 12 units QHS. Refill Accu-check strips and lancets. He will come back in 2 weeks for continue evaluation of his DM.   Hold off on restarting other medications for now.

## 2019-04-09 NOTE — Progress Notes (Signed)
Internal Medicine Clinic Attending  Case discussed with Dr. Helberg at the time of the visit.  We reviewed the resident's history and exam and pertinent patient test results.  I agree with the assessment, diagnosis, and plan of care documented in the resident's note.    

## 2019-04-30 MED FILL — traMADol HCL 50 MG TABS: 50 | 7 days supply | Qty: 14 | Fill #1

## 2019-05-01 ENCOUNTER — Ambulatory Visit: Payer: Medicare HMO | Attending: Internal Medicine

## 2019-05-01 DIAGNOSIS — Z23 Encounter for immunization: Secondary | ICD-10-CM

## 2019-05-01 NOTE — Progress Notes (Signed)
   Covid-19 Vaccination Clinic  Name:  [name]    MRN: [id number] DOB: May 23, 1960  05/01/2019  [name] was observed post Covid-19 immunization for 15 minutes without incident. He was provided with Vaccine Information Sheet and instruction to access the V-Safe system.   [name] was instructed to call 911 with any severe reactions post vaccine: [name] Difficulty breathing  . Swelling of face and throat  . A fast heartbeat  . A bad rash all over body  . Dizziness and weakness   Immunizations Administered    Name Date Dose VIS Date Route   Pfizer COVID-19 Vaccine 05/01/2019 12:31 PM 0.3 mL 02/28/2018 Intramuscular   Manufacturer: [place]   Lot: [id number]   [place]: [id number]

## 2019-05-10 ENCOUNTER — Encounter: Payer: Self-pay | Admitting: *Deleted

## 2019-05-31 ENCOUNTER — Ambulatory Visit (INDEPENDENT_AMBULATORY_CARE_PROVIDER_SITE_OTHER): Payer: Medicare HMO | Admitting: Internal Medicine

## 2019-05-31 ENCOUNTER — Encounter: Payer: Self-pay | Admitting: Internal Medicine

## 2019-05-31 DIAGNOSIS — I10 Essential (primary) hypertension: Secondary | ICD-10-CM | POA: Diagnosis not present

## 2019-05-31 DIAGNOSIS — E118 Type 2 diabetes mellitus with unspecified complications: Secondary | ICD-10-CM | POA: Diagnosis not present

## 2019-05-31 DIAGNOSIS — IMO0002 Reserved for concepts with insufficient information to code with codable children: Secondary | ICD-10-CM

## 2019-05-31 DIAGNOSIS — E1165 Type 2 diabetes mellitus with hyperglycemia: Secondary | ICD-10-CM | POA: Diagnosis not present

## 2019-05-31 DIAGNOSIS — K219 Gastro-esophageal reflux disease without esophagitis: Secondary | ICD-10-CM

## 2019-05-31 DIAGNOSIS — E1142 Type 2 diabetes mellitus with diabetic polyneuropathy: Secondary | ICD-10-CM

## 2019-05-31 MED ORDER — LANTUS SOLOSTAR 100 UNIT/ML ~~LOC~~ SOPN: 17.0000 [IU] | PEN_INJECTOR | Freq: Every day | SUBCUTANEOUS | 11 refills | Status: DC

## 2019-05-31 MED ORDER — ESOMEPRAZOLE MAGNESIUM 40 MG PO CPDR: 40.0000 mg | DELAYED_RELEASE_CAPSULE | Freq: Every day | ORAL | 3 refills | Status: DC

## 2019-05-31 MED ORDER — DULOXETINE HCL 30 MG PO CPEP: 30.0000 mg | ORAL_CAPSULE | Freq: Every day | ORAL | 1 refills | Status: DC

## 2019-05-31 MED ORDER — ACCU-CHEK FASTCLIX LANCETS MISC: 12 refills | Status: DC

## 2019-05-31 MED ORDER — DULOXETINE HCL 60 MG PO CPEP: 60.0000 mg | ORAL_CAPSULE | Freq: Every day | ORAL | 0 refills | Status: DC

## 2019-05-31 MED ORDER — GABAPENTIN 800 MG PO TABS: 800.0000 mg | ORAL_TABLET | Freq: Three times a day (TID) | ORAL | 2 refills | Status: DC

## 2019-05-31 MED ORDER — INSULIN ASPART 100 UNIT/ML FLEXPEN: 5.0000 [IU] | PEN_INJECTOR | Freq: Three times a day (TID) | SUBCUTANEOUS | 2 refills | Status: DC

## 2019-05-31 MED ORDER — CARVEDILOL 6.25 MG PO TABS: 6.2500 mg | ORAL_TABLET | Freq: Two times a day (BID) | ORAL | 3 refills | Status: DC

## 2019-05-31 MED ORDER — AMLODIPINE BESYLATE 10 MG PO TABS: 10.0000 mg | ORAL_TABLET | Freq: Every day | ORAL | 3 refills | Status: DC

## 2019-05-31 MED ORDER — LISINOPRIL 5 MG PO TABS: 5.0000 mg | ORAL_TABLET | Freq: Every day | ORAL | 0 refills | Status: DC

## 2019-05-31 MED FILL — LANTUS SOLOSTAR 100 UNITS/M: 100 | 88 days supply | Qty: 15 | Fill #0

## 2019-05-31 MED FILL — ACCU-CHEK FASTCLIX LANCETS: 34 days supply | Qty: 102 | Fill #0

## 2019-05-31 MED FILL — GABAPENTIN 800 MG TABS: 800 | 60 days supply | Qty: 180 | Fill #0

## 2019-05-31 MED FILL — LISINOPRIL 5 MG TABLET: 5 | 90 days supply | Qty: 90 | Fill #0

## 2019-05-31 MED FILL — NOVOLOG FLEXPEN SYRINGE: 100 | 80 days supply | Qty: 12 | Fill #0

## 2019-05-31 MED FILL — AMLODIPINE BESYLATE 10 MG T: 10 | 90 days supply | Qty: 90 | Fill #0

## 2019-05-31 MED FILL — ESOMEPRAZOLE MAG DR 40 MG C: 40 | 90 days supply | Qty: 90 | Fill #0

## 2019-05-31 MED FILL — DULoxetine HCL 30 MG CPEP: 30 | 90 days supply | Qty: 90 | Fill #0

## 2019-05-31 MED FILL — CARVEDILOL 6.25 MG TABLET: 6.25 | 90 days supply | Qty: 180 | Fill #0

## 2019-05-31 NOTE — Progress Notes (Signed)
CC: Leg pain  HPI: [name] is a 59 y.o. with PMH listed below presenting with complaint of leg pain. Please see problem based assessment and plan for further details.  Past Medical History:  Diagnosis Date  . CHF (congestive heart failure) ([place])   . Critical lower limb ischemia, lt with ABI of 0.60 12/31/2011  . Gangrene ([place])    right foot  . GERD (gastroesophageal reflux disease)   . Hyperlipidemia   . Hypertension   . Neuromuscular disorder ([place])    diabetic neruopathy - hands  . Osteomyelitis ([place]) 2010   left foot, s/p midfoot amputation  . 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 PAD (peripheral artery disease) ([place])    ABIs 11/30/11: L ABI 0.68, R ABI 0.84  . Pneumonia 2010  . PVD (peripheral vascular disease) ([place]) 12/31/2011  . S/P angioplasty with stent, 12/30/11, of Lt SFA, Post. tibialis and PTA of L. ant and post. tibial vessels 12/31/2011  . SOB (shortness of breath)    uses inhaler prn  . Type II diabetes mellitus ([place]) ~ 2002   Review of Systems: Review of Systems  Constitutional: Negative for chills, fever and malaise/fatigue.  Eyes: Negative for blurred vision.  Respiratory: Negative for shortness of breath.   Cardiovascular: Negative for chest pain, palpitations and leg swelling.  Gastrointestinal: Negative for constipation, diarrhea, nausea and vomiting.  Musculoskeletal: Positive for joint pain.  Neurological: Negative for dizziness and headaches.    Physical Exam: Vitals:   05/31/19 1445  BP: (!) 185/84  Pulse: 88  Temp: 98.2 F (36.8 C)  TempSrc: Oral  SpO2: 99%  Weight: 179 lb 8 oz (81.4 kg)  Height: 6' (1.829 m)    Physical Exam  Constitutional: He appears well-developed and well-nourished. He appears distressed (Unable to sit still due to pain of RLE pain).  HENT:  Mouth/Throat: Oropharynx is clear and moist.  Eyes: Conjunctivae are normal.    Cardiovascular: Normal rate, regular rhythm, normal heart sounds and intact distal pulses.  No murmur heard. Respiratory: Effort normal and breath sounds normal. He has no wheezes. He has no rales.  GI: Soft. Bowel sounds are normal. He exhibits no distension. There is no abdominal tenderness.  Musculoskeletal:        General: Tenderness (Tendeness to palpation of LLE) and deformity (RLE BKA) present. No edema. Normal range of motion.     Cervical back: Normal range of motion and neck supple.  Skin: Skin is warm and dry. There is erythema (stage 1 pressure ulcer on R anterior knee).    Assessment & Plan:   Essential hypertension BP Readings from Last 3 Encounters:  05/31/19 (!) 185/84  04/05/19 (!) 149/85  03/31/19 (!) 140/98   Previously well controlled on amlodipine, carvedilol and lisinopril. Per last note, anti-hypertensives not refilled due to complaint of orthostatic hypotension. Current bp very uncontrolled, denies any light-headedness or weakness. Denies any blurry vision, chest pain, headaches.   - Restart amlodipine 10mg , carvedilol 6.25mg  BID, lisinopril 5mg  - F/u with PCP  Diabetic peripheral neuropathy Mr.Dygert is a 59 yo M w/ PMH of uncontrolled T2Dm, CKD3-4, HTN, PVD s/p R BKA and HLD presenting to [facility] w/ complaint of lower extremity pain. He describes the pain as burning, shooting pain that starts at his LLE and shoots up. He mentions being diagnosed with severe diabetic neuropathy in the past but was told he  could not get this refills until he was seen in clinic. He also endorse associated numbness and tingling.  A/P Presents w/ exacerbation of diabetic neuropathy. Chart review and PDMP confirm prior dispense of tramadol but at inconsistent rate. Appeared to be requesting #60 refills every 2 months. Will provide 30 day supply and refill duloxetine and gabapentin. Will decrease dose of duloxetine dose as he is having worsening CKD.  - Refills of chronic meds for  neuropathy sent - F/u w/ PCP to re-address chronic pain mangement  Diabetes mellitus type 2, uncontrolled, with complications Did not bring glucometer. Recently advised to restart his medications as he had stopped them due to TV ads stating some medications can cause cancer. Reassured Mr.Viswanathan that all of his medications are safe and he should restart his medications. Currently only takes his Lantus but self-titrated up to 17 units qhs due to hyperglycemia.  A/P Uncontrolled diabetes complicated by medication non-adherence. Unable to assess recent glycemic control due to not bringing his glucometer. - C/w novolog 5 units TID, lantus 17 units qhs - F/u w/ PCP in 4 weeks    Patient discussed with Dr. Rebeca Alert   -Gilberto Better, PGY2 [facility] Pager: [phone]

## 2019-05-31 NOTE — Patient Instructions (Signed)
Dear [name],  Thank you for allowing [place] to provide your care today. Today we discussed your diabetes and neuropathy    I have ordered no labs for you  Today we made the following changes to your medications:    Please restart your home medications as prescribed  Please follow-up in 4 weeks.    Should you have any questions or concerns please call the internal medicine clinic at [phone].    Thank you for choosing [facility].   Diabetic Neuropathy Diabetic neuropathy refers to nerve damage that is caused by diabetes (diabetes mellitus). Over time, people with diabetes can develop nerve damage throughout the body. There are several types of diabetic neuropathy:  Peripheral neuropathy. This is the most common type of diabetic neuropathy. It causes damage to nerves that carry signals between the spinal cord and other parts of the body (peripheral nerves). This usually affects nerves in the feet and legs first, and may eventually affect the hands and arms. The damage affects the ability to sense touch or temperature.  Autonomic neuropathy. This type causes damage to nerves that control involuntary functions (autonomic nerves). These nerves carry signals that control: ? Heartbeat. ? Body temperature. ? Blood pressure. ? Urination. ? Digestion. ? Sweating. ? Sexual function. ? Response to changing blood sugar (glucose) levels.  Focal neuropathy. This type of nerve damage affects one area of the body, such as an arm, a leg, or the face. The injury may involve one nerve or a small group of nerves. Focal neuropathy can be painful and unpredictable, and occurs most often in older adults with diabetes. This often develops suddenly, but usually improves over time and does not cause long-term problems.  Proximal neuropathy. This type of nerve damage affects the nerves of the thighs, hips, buttocks, or legs. It causes severe pain, weakness, and muscle death (atrophy), usually in the  thigh muscles. It is more common among older men and people who have type 2 diabetes. The length of recovery time may vary. What are the causes? Peripheral, autonomic, and focal neuropathies are caused by diabetes that is not well controlled with treatment. The cause of proximal neuropathy is not known, but it may be caused by inflammation related to uncontrolled blood glucose levels. What are the signs or symptoms? Peripheral neuropathy Peripheral neuropathy develops slowly over time. When the nerves of the feet and legs no longer work, you may experience:  Burning, stabbing, or aching pain in the legs or feet.  Pain or cramping in the legs or feet.  Loss of feeling (numbness) and inability to feel pressure or pain in the feet. This can lead to: ? Thick calluses or sores on areas of constant pressure. ? Ulcers. ? Reduced ability to feel temperature changes.  Foot deformities.  Muscle weakness.  Loss of balance or coordination. Autonomic neuropathy The symptoms of autonomic neuropathy vary depending on which nerves are affected. Symptoms may include:  Problems with digestion, such as: ? Nausea or vomiting. ? Poor appetite. ? Bloating. ? Diarrhea or constipation. ? Trouble swallowing. ? Losing weight without trying to.  Problems with the heart, blood and lungs, such as: ? Dizziness, especially when standing up. ? Fainting. ? Shortness of breath. ? Irregular heartbeat.  Bladder problems, such as: ? Trouble starting or stopping urination. ? Leaking urine. ? Trouble emptying the bladder. ? Urinary tract infections (UTIs).  Problems with other body functions, such as: ? Sweat. You may sweat too much or too little. ? Temperature. You  might get hot easily. Or, you might feel cold more than usual. ? Sexual function. Men may not be able to get or maintain an erection. Women may have vaginal dryness and difficulty with arousal. Focal neuropathy Symptoms affect only one area of  the body. Common symptoms include:  Numbness.  Tingling.  Burning pain.  Prickling feeling.  Very sensitive skin.  Weakness.  Inability to move (paralysis).  Muscle twitching.  Muscles getting smaller (wasting).  Poor coordination.  Double or blurred vision. Proximal neuropathy  Sudden, severe pain in the hip, thigh, or buttocks. Pain may spread from the back into the legs (sciatica).  Pain and numbness in the arms and legs.  Tingling.  Loss of bladder control or bowel control.  Weakness and wasting of thigh muscles.  Difficulty getting up from a seated position.  Abdominal swelling.  Unexplained weight loss. How is this diagnosed? Diagnosis usually involves reviewing your medical history and any symptoms you have. Diagnosis varies depending on the type of neuropathy your health care provider suspects. Peripheral neuropathy Your health care provider will check areas that are affected by your nervous system (neurologic exam), such as your reflexes, how you move, and what you can feel. You may have other tests, such as:  Blood tests.  Removal and examination of fluid that surrounds the spinal cord (lumbar puncture).  CT scan.  MRI.  A test to check the nerves that control muscles (electromyogram, EMG).  Tests of how quickly messages pass through your nerves (nerve conduction velocity tests).  Removal of a small piece of nerve to be examined under a microscope (biopsy). Autonomic neuropathy You may have tests, such as:  Tests to measure your blood pressure and heart rate. This may include monitoring you while you are safely secured to an exam table that moves you from a lying position to an upright position (table tilt test).  Breathing tests to check your lungs.  Tests to check how food moves through the digestive system (gastric emptying tests).  Blood, sweat, or urine tests.  Ultrasound of your bladder.  Spinal fluid tests. Focal  neuropathy This condition may be diagnosed with:  A neurologic exam.  CT scan.  MRI.  EMG.  Nerve conduction velocity tests. Proximal neuropathy There is no test to diagnose this type of neuropathy. You may have tests to rule out other possible causes of this type of neuropathy. Tests may include:  X-rays of your spine and lumbar region.  Lumbar puncture.  MRI. How is this treated? The goal of treatment is to keep nerve damage from getting worse. The most important part of treatment is keeping your blood glucose level and your A1C level within your target range by following your diabetes management plan. Over time, maintaining lower blood glucose levels helps lessen symptoms. In some cases, you may need prescription pain medicine. Follow these instructions at home:  Lifestyle   Do not use any products that contain nicotine or tobacco, such as cigarettes and e-cigarettes. If you need help quitting, ask your health care provider.  Be physically active every day. Include strength training and balance exercises.  Follow a healthy meal plan.  Work with your health care provider to manage your blood pressure. General instructions  Follow your diabetes management plan as directed. ? Check your blood glucose levels as directed by your health care provider. ? Keep your blood glucose in your target range as directed by your health care provider. ? Have your A1C level checked at least two  times a year, or as often as told by your health care provider.  Take over the counter and prescription medicines only as told by your health care provider. This includes insulin and diabetes medicine.  Do not drive or use heavy machinery while taking prescription pain medicines.  Check your skin and feet every day for cuts, bruises, redness, blisters, or sores.  Keep all follow up visits as told by your health care provider. This is important. Contact a health care provider if:  You have  burning, stabbing, or aching pain in your legs or feet.  You are unable to feel pressure or pain in your feet.  You develop problems with digestion, such as: ? Nausea. ? Vomiting. ? Bloating. ? Constipation. ? Diarrhea. ? Abdominal pain.  You have difficulty with urination, such as inability: ? To control when you urinate (incontinence). ? To completely empty the bladder (retention).  You have palpitations.  You feel dizzy, weak, or faint when you stand up. Get help right away if:  You cannot urinate.  You have sudden weakness or loss of coordination.  You have trouble speaking.  You have pain or pressure in your chest.  You have an irregular heart beat.  You have sudden inability to move a part of your body. Summary  Diabetic neuropathy refers to nerve damage that is caused by diabetes. It can affect nerves throughout the entire body, causing numbness and pain in the arms, legs, digestive tract, heart, and other body systems.  Keep your blood glucose level and your blood pressure in your target range, as directed by your health care provider. This can help prevent neuropathy from getting worse.  Check your skin and feet every day for cuts, bruises, redness, blisters, or sores.  Do not use any products that contain nicotine or tobacco, such as cigarettes and e-cigarettes. If you need help quitting, ask your health care provider. This information is not intended to replace advice given to you by your health care provider. Make sure you discuss any questions you have with your health care provider. Document Revised: 02/02/2017 Document Reviewed: 01/26/2016 Elsevier Patient Education  2020 [employer].

## 2019-06-01 ENCOUNTER — Encounter: Payer: Self-pay | Admitting: Internal Medicine

## 2019-06-01 MED ORDER — TRAMADOL HCL 50 MG PO TABS: 50.0000 mg | ORAL_TABLET | Freq: Two times a day (BID) | ORAL | 0 refills | Status: DC | PRN

## 2019-06-01 MED FILL — traMADol HCL 50 MG TABS: 50 | 15 days supply | Qty: 30 | Fill #0

## 2019-06-01 NOTE — Assessment & Plan Note (Addendum)
BP Readings from Last 3 Encounters:  05/31/19 (!) 185/84  04/05/19 (!) 149/85  03/31/19 (!) 140/98   Previously well controlled on amlodipine, carvedilol and lisinopril. Per last note, anti-hypertensives not refilled due to complaint of orthostatic hypotension. Current bp very uncontrolled, denies any light-headedness or weakness. Denies any blurry vision, chest pain, headaches.   - Restart amlodipine 10mg , carvedilol 6.25mg  BID, lisinopril 5mg  - F/u with PCP

## 2019-06-01 NOTE — Assessment & Plan Note (Addendum)
Did not bring glucometer. Recently advised to restart his medications as he had stopped them due to TV ads stating some medications can cause cancer. Reassured [name] that all of his medications are safe and he should restart his medications. Currently only takes his Lantus but self-titrated up to 17 units qhs due to hyperglycemia.  A/P Uncontrolled diabetes complicated by medication non-adherence. Unable to assess recent glycemic control due to not bringing his glucometer. - C/w novolog 5 units TID, lantus 17 units qhs - F/u w/ PCP in 4 weeks for a1c check

## 2019-06-01 NOTE — Assessment & Plan Note (Addendum)
[name] is a 59 yo M w/ PMH of uncontrolled T2Dm, CKD3-4, HTN, PVD s/p R BKA and HLD presenting to [facility] w/ complaint of lower extremity pain. He describes the pain as burning, shooting pain that starts at his LLE and shoots up. He mentions being diagnosed with severe diabetic neuropathy in the past but was told he could not get this refills until he was seen in clinic. He also endorse associated numbness and tingling.  A/P Presents w/ exacerbation of diabetic neuropathy. Chart review and PDMP confirm prior dispense of tramadol but at inconsistent rate. Appeared to be requesting #60 refills every 2 months. Will provide 30 day supply and refill duloxetine and gabapentin. Will decrease dose of duloxetine dose as he is having worsening CKD.  - Refills of chronic meds for neuropathy sent - F/u w/ PCP to re-address chronic pain mangement

## 2019-06-04 NOTE — Progress Notes (Signed)
Internal Medicine Clinic Attending  Case discussed with Dr. Lee at the time of the visit.  We reviewed the resident's history and exam and pertinent patient test results.  I agree with the assessment, diagnosis, and plan of care documented in the resident's note.  Ziggy Chanthavong, M.D., Ph.D.  

## 2019-07-25 ENCOUNTER — Encounter: Payer: Medicare HMO | Admitting: Internal Medicine

## 2019-08-01 ENCOUNTER — Ambulatory Visit (INDEPENDENT_AMBULATORY_CARE_PROVIDER_SITE_OTHER): Payer: Medicare HMO | Admitting: Internal Medicine

## 2019-08-01 ENCOUNTER — Encounter: Payer: Self-pay | Admitting: Internal Medicine

## 2019-08-01 VITALS — BP 178/90 | HR 87 | Temp 98.3°F | Ht 72.0 in | Wt 179.3 lb

## 2019-08-01 DIAGNOSIS — R0989 Other specified symptoms and signs involving the circulatory and respiratory systems: Secondary | ICD-10-CM | POA: Diagnosis not present

## 2019-08-01 DIAGNOSIS — I129 Hypertensive chronic kidney disease with stage 1 through stage 4 chronic kidney disease, or unspecified chronic kidney disease: Secondary | ICD-10-CM

## 2019-08-01 DIAGNOSIS — E118 Type 2 diabetes mellitus with unspecified complications: Secondary | ICD-10-CM

## 2019-08-01 DIAGNOSIS — R198 Other specified symptoms and signs involving the digestive system and abdomen: Secondary | ICD-10-CM

## 2019-08-01 DIAGNOSIS — E782 Mixed hyperlipidemia: Secondary | ICD-10-CM

## 2019-08-01 DIAGNOSIS — E1165 Type 2 diabetes mellitus with hyperglycemia: Secondary | ICD-10-CM | POA: Diagnosis not present

## 2019-08-01 DIAGNOSIS — E1122 Type 2 diabetes mellitus with diabetic chronic kidney disease: Secondary | ICD-10-CM

## 2019-08-01 DIAGNOSIS — Z794 Long term (current) use of insulin: Secondary | ICD-10-CM

## 2019-08-01 DIAGNOSIS — I10 Essential (primary) hypertension: Secondary | ICD-10-CM

## 2019-08-01 DIAGNOSIS — N183 Chronic kidney disease, stage 3 unspecified: Secondary | ICD-10-CM

## 2019-08-01 DIAGNOSIS — IMO0002 Reserved for concepts with insufficient information to code with codable children: Secondary | ICD-10-CM

## 2019-08-01 LAB — POCT GLYCOSYLATED HEMOGLOBIN (HGB A1C): Hemoglobin A1C: 11.5 % — AB (ref 4.0–5.6)

## 2019-08-01 LAB — GLUCOSE, CAPILLARY: Glucose-Capillary: 208 mg/dL — ABNORMAL HIGH (ref 70–99)

## 2019-08-01 MED ORDER — LISINOPRIL-HYDROCHLOROTHIAZIDE 10-12.5 MG PO TABS: 1.0000 | ORAL_TABLET | Freq: Every day | ORAL | 3 refills | Status: DC

## 2019-08-01 MED FILL — LISINOPRIL-HCTZ 10-12.5 MG: 10-12.5 | 90 days supply | Qty: 90 | Fill #0

## 2019-08-01 NOTE — Patient Instructions (Addendum)
Thank you for allowing [place] to provide your care today. Today we discussed your pain and dizziness    I have ordered bmp labs for you. I will call if any are abnormal.    Today we made the following changes to your medications.    Please start lisinopril-hctz 10-12.5mg  daily Please bring your glucometer with your on your next visit  Please follow-up in 2 weeks for bp check    Should you have any questions or concerns please call the internal medicine clinic at [phone].     Diabetes Mellitus and Foot Care Foot care is an important part of your health, especially when you have diabetes. Diabetes may cause you to have problems because of poor blood flow (circulation) to your feet and legs, which can cause your skin to:  Become thinner and drier.  Break more easily.  Heal more slowly.  Peel and crack. You may also have nerve damage (neuropathy) in your legs and feet, causing decreased feeling in them. This means that you may not notice minor injuries to your feet that could lead to more serious problems. Noticing and addressing any potential problems early is the best way to prevent future foot problems. How to care for your feet Foot hygiene  Wash your feet daily with warm water and mild soap. Do not use hot water. Then, pat your feet and the areas between your toes until they are completely dry. Do not soak your feet as this can dry your skin.  Trim your toenails straight across. Do not dig under them or around the cuticle. File the edges of your nails with an emery board or nail file.  Apply a moisturizing lotion or petroleum jelly to the skin on your feet and to dry, brittle toenails. Use lotion that does not contain alcohol and is unscented. Do not apply lotion between your toes. Shoes and socks  Wear clean socks or stockings every day. Make sure they are not too tight. Do not wear knee-high stockings since they may decrease blood flow to your legs.  Wear shoes that fit  properly and have enough cushioning. Always look in your shoes before you put them on to be sure there are no objects inside.  To break in new shoes, wear them for just a few hours a day. This prevents injuries on your feet. Wounds, scrapes, corns, and calluses  Check your feet daily for blisters, cuts, bruises, sores, and redness. If you cannot see the bottom of your feet, use a mirror or ask someone for help.  Do not cut corns or calluses or try to remove them with medicine.  If you find a minor scrape, cut, or break in the skin on your feet, keep it and the skin around it clean and dry. You may clean these areas with mild soap and water. Do not clean the area with peroxide, alcohol, or iodine.  If you have a wound, scrape, corn, or callus on your foot, look at it several times a day to make sure it is healing and not infected. Check for: ? Redness, swelling, or pain. ? Fluid or blood. ? Warmth. ? Pus or a bad smell. General instructions  Do not cross your legs. This may decrease blood flow to your feet.  Do not use heating pads or hot water bottles on your feet. They may burn your skin. If you have lost feeling in your feet or legs, you may not know this is happening until it is too  late.  Protect your feet from hot and cold by wearing shoes, such as at the beach or on hot pavement.  Schedule a complete foot exam at least once a year (annually) or more often if you have foot problems. If you have foot problems, report any cuts, sores, or bruises to your health care provider immediately. Contact a health care provider if:  You have a medical condition that increases your risk of infection and you have any cuts, sores, or bruises on your feet.  You have an injury that is not healing.  You have redness on your legs or feet.  You feel burning or tingling in your legs or feet.  You have pain or cramps in your legs and feet.  Your legs or feet are numb.  Your feet always feel  cold.  You have pain around a toenail. Get help right away if:  You have a wound, scrape, corn, or callus on your foot and: ? You have pain, swelling, or redness that gets worse. ? You have fluid or blood coming from the wound, scrape, corn, or callus. ? Your wound, scrape, corn, or callus feels warm to the touch. ? You have pus or a bad smell coming from the wound, scrape, corn, or callus. ? You have a fever. ? You have a red line going up your leg. Summary  Check your feet every day for cuts, sores, red spots, swelling, and blisters.  Moisturize feet and legs daily.  Wear shoes that fit properly and have enough cushioning.  If you have foot problems, report any cuts, sores, or bruises to your health care provider immediately.  Schedule a complete foot exam at least once a year (annually) or more often if you have foot problems. This information is not intended to replace advice given to you by your health care provider. Make sure you discuss any questions you have with your health care provider. Document Revised: 09/13/2018 Document Reviewed: 01/23/2016 Elsevier Patient Education  [place].
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
## 2019-08-02 ENCOUNTER — Encounter: Payer: Self-pay | Admitting: Internal Medicine

## 2019-08-02 ENCOUNTER — Telehealth: Payer: Self-pay

## 2019-08-02 DIAGNOSIS — E1142 Type 2 diabetes mellitus with diabetic polyneuropathy: Secondary | ICD-10-CM

## 2019-08-02 DIAGNOSIS — E782 Mixed hyperlipidemia: Secondary | ICD-10-CM

## 2019-08-02 DIAGNOSIS — R198 Other specified symptoms and signs involving the digestive system and abdomen: Secondary | ICD-10-CM | POA: Insufficient documentation

## 2019-08-02 DIAGNOSIS — R0989 Other specified symptoms and signs involving the circulatory and respiratory systems: Secondary | ICD-10-CM | POA: Insufficient documentation

## 2019-08-02 LAB — LIPID PANEL
Chol/HDL Ratio: 5.2 ratio — ABNORMAL HIGH (ref 0.0–5.0)
Cholesterol, Total: 181 mg/dL (ref 100–199)
HDL: 35 mg/dL — ABNORMAL LOW (ref >39)
LDL Chol Calc (NIH): 130 mg/dL — ABNORMAL HIGH (ref 0–99)
Triglycerides: 87 mg/dL (ref 0–149)
VLDL Cholesterol Cal: 16 mg/dL (ref 5–40)

## 2019-08-02 LAB — BMP8+ANION GAP
Anion Gap: 13 mmol/L (ref 10.0–18.0)
BUN/Creatinine Ratio: 5 — ABNORMAL LOW (ref 9–20)
BUN: 12 mg/dL (ref 6–24)
CO2: 24 mmol/L (ref 20–29)
Calcium: 8.6 mg/dL — ABNORMAL LOW (ref 8.7–10.2)
Chloride: 99 mmol/L (ref 96–106)
Creatinine, Ser: 2.4 mg/dL — ABNORMAL HIGH (ref 0.76–1.27)
GFR calc Af Amer: 33 mL/min/{1.73_m2} — ABNORMAL LOW (ref >59)
GFR calc non Af Amer: 28 mL/min/{1.73_m2} — ABNORMAL LOW (ref >59)
Glucose: 208 mg/dL — ABNORMAL HIGH (ref 65–99)
Potassium: 4.4 mmol/L (ref 3.5–5.2)
Sodium: 136 mmol/L (ref 134–144)

## 2019-08-02 MED ORDER — DULOXETINE HCL 30 MG PO CPEP: 30.0000 mg | ORAL_CAPSULE | Freq: Every day | ORAL | 1 refills | Status: DC

## 2019-08-02 MED ORDER — ROSUVASTATIN CALCIUM 20 MG PO TABS: 20.0000 mg | ORAL_TABLET | Freq: Every day | ORAL | 3 refills | Status: DC

## 2019-08-02 MED ORDER — LANTUS SOLOSTAR 100 UNIT/ML ~~LOC~~ SOPN: 22.0000 [IU] | PEN_INJECTOR | Freq: Every day | SUBCUTANEOUS | 11 refills | Status: DC

## 2019-08-02 MED ORDER — INSULIN ASPART 100 UNIT/ML FLEXPEN: 11.0000 [IU] | PEN_INJECTOR | Freq: Three times a day (TID) | SUBCUTANEOUS | 2 refills | Status: DC

## 2019-08-02 MED FILL — ROSUVASTATIN CALCIUM 20 MG: 20 | 90 days supply | Qty: 90 | Fill #0

## 2019-08-02 NOTE — Progress Notes (Signed)
Internal Medicine Clinic Attending  I saw and evaluated the patient.  I personally confirmed the key portions of the history and exam documented by Dr. Lee and I reviewed pertinent patient test results.  The assessment, diagnosis, and plan were formulated together and I agree with the documentation in the resident's note.  

## 2019-08-02 NOTE — Assessment & Plan Note (Signed)
Has known CKD 3b/4 due to uncontrolled diabetes. Currently on lisinopril 5mg   - Increase to lisinopril 10mg  - BMP

## 2019-08-02 NOTE — Assessment & Plan Note (Addendum)
Lab Results  Component Value Date   HGBA1C 11.5 (A) 08/01/2019   Continues to be uncontrolled with hgb a1c 11.8->11.5. Has hx of medication non-adherence including recent self-discontinuation of home regimen due to fear of cancer caused by diabetic meds. [name] has been on novolog 11 units TID and Lantus 17 units qhs for about 2 months now. Did not bring his glucometer with him today. Unable to describe recent blood glucose readings.  A/P Uncontrolled diabetes. Unable to take most oral regimen due to CKD. Need to up-titrate to previous home regimen. Previously on 34 units qhs long acting and 11 units short acting TID qc but down-titrated due to fear of side effects. Agreeable to increasing his dose slowly to - C/w novolog 11 units TID qc - Increase Lantus to 22 units qhs - Referral to podiatry for diabetic foot care

## 2019-08-02 NOTE — Assessment & Plan Note (Signed)
Lipid Panel     Component Value Date/Time   CHOL 181 08/01/2019 1510   TRIG 87 08/01/2019 1510   HDL 35 (L) 08/01/2019 1510   CHOLHDL 5.2 (H) 08/01/2019 1510   CHOLHDL 3.7 05/25/2013 1544   VLDL 20 05/25/2013 1544   LDLCALC 130 (H) 08/01/2019 1510   LABVLDL 16 08/01/2019 1510   Previously on rosuvastatin 20mg  daily but medication had fallen off the med list. Mr.Egner mentions that he not currently taking rosuvastatin. Will need to start at least moderate intensity statin. Will check lipid profile to assess.  - Lipid profile  Addendum: LDL 130 with 10-year ascvd risk of 60%. Need to start high-intensity statin - Restart rosuvastatin 20mg  daily
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
## 2019-08-02 NOTE — Assessment & Plan Note (Signed)
BP Readings from Last 3 Encounters:  08/01/19 (!) 178/90  05/31/19 (!) 185/84  04/05/19 (!) 149/85   Uncontrolled. Mentions taking his amlodipine 10mg , carvedilol 6.25mg  BID and lisinopril 5mg  daily as prescribed although has prior history of medication non-adherence. Previously self-discontinued his anti-hypertensive due to orthostatic hypotension. Currently denies any hypotension symptoms. Denies any chest pain, dyspnea, lower extremity edema.  - C/w amloidipn 10mg , Carvedilol 6.25mg  BID - Start lisinopril-HCTZ 10-12.5mg  daily - BMP - F/u in 2 weeks for BP check

## 2019-08-02 NOTE — Telephone Encounter (Signed)
Spoke with [name] regarding his lab results and need to restart his rosuvastatin. [name] requests his meds get resent to [facility] as it is closer to his residence and also requests refill of his duloxetine. Scripts sent to appropriate pharmacy.

## 2019-08-02 NOTE — Telephone Encounter (Signed)
Requesting test results, please call pt back.  

## 2019-08-13 ENCOUNTER — Other Ambulatory Visit: Payer: Self-pay | Admitting: *Deleted

## 2019-08-13 DIAGNOSIS — IMO0002 Reserved for concepts with insufficient information to code with codable children: Secondary | ICD-10-CM

## 2019-08-13 DIAGNOSIS — E1165 Type 2 diabetes mellitus with hyperglycemia: Secondary | ICD-10-CM

## 2019-08-13 MED ORDER — LANTUS SOLOSTAR 100 UNIT/ML ~~LOC~~ SOPN: 22.0000 [IU] | PEN_INJECTOR | Freq: Every day | SUBCUTANEOUS | 11 refills | Status: DC

## 2019-08-13 MED ORDER — INSULIN ASPART 100 UNIT/ML FLEXPEN: 11.0000 [IU] | PEN_INJECTOR | Freq: Three times a day (TID) | SUBCUTANEOUS | 2 refills | Status: DC

## 2019-08-13 MED FILL — LISINOPRIL-HCTZ 10-12.5 MG: 10-12.5 | 90 days supply | Qty: 90 | Fill #0

## 2019-08-13 MED FILL — NOVOLOG FLEXPEN SYRINGE: 100 | 90 days supply | Qty: 30 | Fill #0

## 2019-08-13 MED FILL — ROSUVASTATIN CALCIUM 20 MG: 20 | 90 days supply | Qty: 90 | Fill #0

## 2019-08-13 NOTE — Telephone Encounter (Signed)
Rx for novolog and lantus was set to "no print" and was not received by pharmacy.  Will have MD resend rx.  Of note, pt wishes to use the [facility] for all meds

## 2019-08-16 ENCOUNTER — Encounter: Payer: Self-pay | Admitting: Student

## 2019-08-16 ENCOUNTER — Telehealth: Payer: Self-pay | Admitting: Dietician

## 2019-08-16 ENCOUNTER — Ambulatory Visit (INDEPENDENT_AMBULATORY_CARE_PROVIDER_SITE_OTHER): Payer: Medicare HMO | Admitting: Student

## 2019-08-16 VITALS — BP 156/76 | HR 80 | Temp 98.4°F | Wt 176.4 lb

## 2019-08-16 DIAGNOSIS — Z0000 Encounter for general adult medical examination without abnormal findings: Secondary | ICD-10-CM

## 2019-08-16 DIAGNOSIS — E1165 Type 2 diabetes mellitus with hyperglycemia: Secondary | ICD-10-CM | POA: Diagnosis not present

## 2019-08-16 DIAGNOSIS — I10 Essential (primary) hypertension: Secondary | ICD-10-CM | POA: Diagnosis not present

## 2019-08-16 DIAGNOSIS — K219 Gastro-esophageal reflux disease without esophagitis: Secondary | ICD-10-CM | POA: Diagnosis not present

## 2019-08-16 DIAGNOSIS — IMO0002 Reserved for concepts with insufficient information to code with codable children: Secondary | ICD-10-CM

## 2019-08-16 LAB — GLUCOSE, CAPILLARY: Glucose-Capillary: 242 mg/dL — ABNORMAL HIGH (ref 70–99)

## 2019-08-16 MED ORDER — LISINOPRIL-HYDROCHLOROTHIAZIDE 10-12.5 MG PO TABS: 2.0000 | ORAL_TABLET | Freq: Every day | ORAL | 3 refills | Status: DC

## 2019-08-16 NOTE — Progress Notes (Signed)
CC: 2 week follow up post change to diabetes medications  HPI:  [name] is a 59 y.o. with a PMHx of right BKA, CHF, HTN, HLD, T2DM who presents to the clinic today for a 2 week follow up after his lantus was increased during his last visit.   [name] states there was confusion with his medications in that some were sent to [facility] and the rest were sent to [facility]. [name] notes [name] was able to pickup his medications only a few days ago. [name] is unsure of what medications [name] is taking and states they are managed by his daughter. [name] also states it is sometimes difficult to know what medications and how much [name] should be taking because the labels rub off. I asked [name] what I could do to assist him in being able to take his medications correctly, [name] responded by saying nothing. I then offered to print off a paper copy of his medications and [name] said that would be helpful.   When asked about checking his sugars at home, [name] states [name] checks them sometimes after [name] eats, but forgets to check them before [name] eats. [name] does note being compliant with his diabetes medicine, but has not changed to the new dosing. When asked why, the patient states [name] did not know his medication was adjusted during his last visit.   The patient then proceeded to ask about what [name] should and should not be eating. Dr. Rebeca [name] reviewed how diabetics should check carbohydrates on the back of nutrition labels and limit the amount they consume. Dr. Rebeca [name] then mentioned diabetic/nutrition counseling, which the patient said [name] would like to have.    [name] had no other complaints at the time of my examination   Past Medical History:  Diagnosis Date  . CHF (congestive heart failure) ([place])   . Critical lower limb ischemia, lt with ABI of 0.60 12/31/2011  . Gangrene ([place])    right foot  . GERD (gastroesophageal reflux disease)   . Hyperlipidemia   . Hypertension   . Neuromuscular disorder ([place])     diabetic neruopathy - hands  . Osteomyelitis ([place]) 2010   left foot, s/p midfoot amputation  . 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 PAD (peripheral artery disease) ([place])    ABIs 11/30/11: L ABI 0.68, R ABI 0.84  . Pneumonia 2010  . PVD (peripheral vascular disease) ([place]) 12/31/2011  . S/P angioplasty with stent, 12/30/11, of Lt SFA, Post. tibialis and PTA of L. ant and post. tibial vessels 12/31/2011  . SOB (shortness of breath)    uses inhaler prn  . Type II diabetes mellitus ([place]) ~ 2002   Review of Systems:  As per HPI  Physical Exam:  Vitals:   08/16/19 1353 08/16/19 1402 08/16/19 1442  BP: (!) 180/85 (!) 161/88 (!) 156/76  Pulse: 89 89 80  Temp: 98.4 F (36.9 C)    TempSrc: Oral    SpO2: 99%    Weight: 176 lb 6.4 oz (80 kg)     Physical Exam Vitals and nursing note reviewed.  Constitutional:      General: [name] is not in acute distress.    Appearance: Normal appearance. [name] is not ill-appearing, toxic-appearing or diaphoretic.  HENT:     Head: Atraumatic.  Eyes:     Extraocular Movements: Extraocular movements intact.  Cardiovascular:  Rate and Rhythm: Normal rate and regular rhythm.     Pulses: Normal pulses.     Heart sounds: Normal heart sounds. No murmur heard.  No gallop.   Pulmonary:     Effort: Pulmonary effort is normal. No respiratory distress.     Breath sounds: Normal breath sounds. No stridor. No wheezing, rhonchi or rales.  Musculoskeletal:     Cervical back: Normal range of motion.  Neurological:     General: No focal deficit present.     Mental Status: [name] and oriented to person, place, and time.     Assessment & Plan:   At the end of the visit I sat down with [name] and went over the changes to his medications. [name] then once again stated [name] was not sure what medications [name] did and did not have. I instructed him to go home, review the medications with his  daughter, and to call the clinic and let us know what medications [name] is missing. We printed him off a list of the medications [name] is prescribed to compare to what medications [name] has at home. [name] agreed and nodded in understanding.    See Encounters Tab for problem based charting.  Patient discussed with and seen by Dr. Rebeca [name]

## 2019-08-16 NOTE — Assessment & Plan Note (Addendum)
Blood pressure today 156/76. Patient states he is unsure of the medications he has and has not been taking. Encouraged patient to go home, compare the medication list we printed off with his medications at home and call us if he needs any medications filled.  A/P - Continue Lisinopril-HCTZ 10-12.5 tab BID.  - Encouraged patient to check blood pressures at home.

## 2019-08-16 NOTE — Patient Instructions (Addendum)
Thank you for allowing [place] to assist in your care today! Today we discussed:   1) High blood pressure - The past few visits to our clinic your blood pressure has been elevated.  - We will be increasing the amount you take of your lisinopril HCTZ to twice daily.  - If you are able please check your blood pressures at home and record these. Bring in the recordings at your next visit!  2) Diabetes - During your last visit we increased your lantus to 22 units at night and continue the novolog 11 units with meals  - We will also be putting in a referral for you to meet with [name] about diabetes education.   3) Acid Reflux  - Please continue to take your Nexium for your acid reflux.   If you or your daughter have any questions about your medications please call the clinic!    Carbohydrate Counting for Diabetes Mellitus, Adult  Carbohydrate counting is a method of keeping track of how many carbohydrates you eat. Eating carbohydrates naturally increases the amount of sugar (glucose) in the blood. Counting how many carbohydrates you eat helps keep your blood glucose within normal limits, which helps you manage your diabetes (diabetes mellitus). It is important to know how many carbohydrates you can safely have in each meal. This is different for every person. A diet and nutrition specialist (registered dietitian) can help you make a meal plan and calculate how many carbohydrates you should have at each meal and snack. Carbohydrates are found in the following foods:  Grains, such as breads and cereals.  Dried beans and soy products.  Starchy vegetables, such as potatoes, peas, and corn.  Fruit and fruit juices.  Milk and yogurt.  Sweets and snack foods, such as cake, cookies, candy, chips, and soft drinks. How do I count carbohydrates? There are two ways to count carbohydrates in food. You can use either of the methods or a combination of both. Reading "Nutrition Facts" on packaged food The  "Nutrition Facts" list is included on the labels of almost all packaged foods and beverages in the [place] It includes:  The serving size.  Information about nutrients in each serving, including the grams (g) of carbohydrate per serving. To use the "Nutrition Facts":  Decide how many servings you will have.  Multiply the number of servings by the number of carbohydrates per serving.  The resulting number is the total amount of carbohydrates that you will be having. Learning standard serving sizes of other foods When you eat carbohydrate foods that are not packaged or do not include "Nutrition Facts" on the label, you need to measure the servings in order to count the amount of carbohydrates:  Measure the foods that you will eat with a food scale or measuring cup, if needed.  Decide how many standard-size servings you will eat.  Multiply the number of servings by 15. Most carbohydrate-rich foods have about 15 g of carbohydrates per serving. ? For example, if you eat 8 oz (170 g) of strawberries, you will have eaten 2 servings and 30 g of carbohydrates (2 servings x 15 g = 30 g).  For foods that have more than one food mixed, such as soups and casseroles, you must count the carbohydrates in each food that is included. The following list contains standard serving sizes of common carbohydrate-rich foods. Each of these servings has about 15 g of carbohydrates:   hamburger bun or  English muffin.   oz (15 mL)  syrup.   oz (14 g) jelly.  1 slice of bread.  1 six-inch tortilla.  3 oz (85 g) cooked rice or pasta.  4 oz (113 g) cooked dried beans.  4 oz (113 g) starchy vegetable, such as peas, corn, or potatoes.  4 oz (113 g) hot cereal.  4 oz (113 g) mashed potatoes or  of a large baked potato.  4 oz (113 g) canned or frozen fruit.  4 oz (120 mL) fruit juice.  4-6 crackers.  6 chicken nuggets.  6 oz (170 g) unsweetened dry cereal.  6 oz (170 g) plain fat-free yogurt or  yogurt sweetened with artificial sweeteners.  8 oz (240 mL) milk.  8 oz (170 g) fresh fruit or one small piece of fruit.  24 oz (680 g) popped popcorn. Example of carbohydrate counting Sample meal  3 oz (85 g) chicken breast.  6 oz (170 g) brown rice.  4 oz (113 g) corn.  8 oz (240 mL) milk.  8 oz (170 g) strawberries with sugar-free whipped topping. Carbohydrate calculation 1. Identify the foods that contain carbohydrates: ? Rice. ? Corn. ? Milk. ? Strawberries. 2. Calculate how many servings you have of each food: ? 2 servings rice. ? 1 serving corn. ? 1 serving milk. ? 1 serving strawberries. 3. Multiply each number of servings by 15 g: ? 2 servings rice x 15 g = 30 g. ? 1 serving corn x 15 g = 15 g. ? 1 serving milk x 15 g = 15 g. ? 1 serving strawberries x 15 g = 15 g. 4. Add together all of the amounts to find the total grams of carbohydrates eaten: ? 30 g + 15 g + 15 g + 15 g = 75 g of carbohydrates total. Summary  Carbohydrate counting is a method of keeping track of how many carbohydrates you eat.  Eating carbohydrates naturally increases the amount of sugar (glucose) in the blood.  Counting how many carbohydrates you eat helps keep your blood glucose within normal limits, which helps you manage your diabetes.  A diet and nutrition specialist (registered dietitian) can help you make a meal plan and calculate how many carbohydrates you should have at each meal and snack. This information is not intended to replace advice given to you by your health care provider. Make sure you discuss any questions you have with your health care provider. Document Revised: 07/15/2016 Document Reviewed: 06/04/2015 Elsevier Patient Education  [place].   Diabetes Mellitus and Nutrition, Adult When you have diabetes (diabetes mellitus), it is very important to have healthy eating habits because your blood sugar (glucose) levels are greatly affected by what you eat  and drink. Eating healthy foods in the appropriate amounts, at about the same times every day, can help you:  Control your blood glucose.  Lower your risk of heart disease.  Improve your blood pressure.  Reach or maintain a healthy weight. Every person with diabetes is different, and each person has different needs for a meal plan. Your health care provider may recommend that you work with a diet and nutrition specialist (dietitian) to make a meal plan that is best for you. Your meal plan may vary depending on factors such as:  The calories you need.  The medicines you take.  Your weight.  Your blood glucose, blood pressure, and cholesterol levels.  Your activity level.  Other health conditions you have, such as heart or kidney disease. How do carbohydrates affect me?  Carbohydrates, also called carbs, affect your blood glucose level more than any other type of food. Eating carbs naturally raises the amount of glucose in your blood. Carb counting is a method for keeping track of how many carbs you eat. Counting carbs is important to keep your blood glucose at a healthy level, especially if you use insulin or take certain oral diabetes medicines. It is important to know how many carbs you can safely have in each meal. This is different for every person. Your dietitian can help you calculate how many carbs you should have at each meal and for each snack. Foods that contain carbs include:  Bread, cereal, rice, pasta, and crackers.  Potatoes and corn.  Peas, beans, and lentils.  Milk and yogurt.  Fruit and juice.  Desserts, such as cakes, cookies, ice cream, and candy. How does alcohol affect me? Alcohol can cause a sudden decrease in blood glucose (hypoglycemia), especially if you use insulin or take certain oral diabetes medicines. Hypoglycemia can be a life-threatening condition. Symptoms of hypoglycemia (sleepiness, dizziness, and confusion) are similar to symptoms of having too  much alcohol. If your health care provider says that alcohol is safe for you, follow these guidelines:  Limit alcohol intake to no more than 1 drink per day for nonpregnant women and 2 drinks per day for men. One drink equals 12 oz of beer, 5 oz of wine, or 1 oz of hard liquor.  Do not drink on an empty stomach.  Keep yourself hydrated with water, diet soda, or unsweetened iced tea.  Keep in mind that regular soda, juice, and other mixers may contain a lot of sugar and must be counted as carbs. What are tips for following this plan?  Reading food labels  Start by checking the serving size on the "Nutrition Facts" label of packaged foods and drinks. The amount of calories, carbs, fats, and other nutrients listed on the label is based on one serving of the item. Many items contain more than one serving per package.  Check the total grams (g) of carbs in one serving. You can calculate the number of servings of carbs in one serving by dividing the total carbs by 15. For example, if a food has 30 g of total carbs, it would be equal to 2 servings of carbs.  Check the number of grams (g) of saturated and trans fats in one serving. Choose foods that have low or no amount of these fats.  Check the number of milligrams (mg) of salt (sodium) in one serving. Most people should limit total sodium intake to less than 2,300 mg per day.  Always check the nutrition information of foods labeled as "low-fat" or "nonfat". These foods may be higher in added sugar or refined carbs and should be avoided.  Talk to your dietitian to identify your daily goals for nutrients listed on the label. Shopping  Avoid buying canned, premade, or processed foods. These foods tend to be high in fat, sodium, and added sugar.  Shop around the outside edge of the grocery store. This includes fresh fruits and vegetables, bulk grains, fresh meats, and fresh dairy. Cooking  Use low-heat cooking methods, such as baking, instead  of high-heat cooking methods like deep frying.  Cook using healthy oils, such as olive, canola, or sunflower oil.  Avoid cooking with butter, cream, or high-fat meats. Meal planning  Eat meals and snacks regularly, preferably at the same times every day. Avoid going long periods of time without  eating.  Eat foods high in fiber, such as fresh fruits, vegetables, beans, and whole grains. Talk to your dietitian about how many servings of carbs you can eat at each meal.  Eat 4-6 ounces (oz) of lean protein each day, such as lean meat, chicken, fish, eggs, or tofu. One oz of lean protein is equal to: ? 1 oz of meat, chicken, or fish. ? 1 egg. ?  cup of tofu.  Eat some foods each day that contain healthy fats, such as avocado, nuts, seeds, and fish. Lifestyle  Check your blood glucose regularly.  Exercise regularly as told by your health care provider. This may include: ? 150 minutes of moderate-intensity or vigorous-intensity exercise each week. This could be brisk walking, biking, or water aerobics. ? Stretching and doing strength exercises, such as yoga or weightlifting, at least 2 times a week.  Take medicines as told by your health care provider.  Do not use any products that contain nicotine or tobacco, such as cigarettes and e-cigarettes. If you need help quitting, ask your health care provider.  Work with a Social worker or diabetes educator to identify strategies to manage stress and any emotional and social challenges. Questions to ask a health care provider  Do I need to meet with a diabetes educator?  Do I need to meet with a dietitian?  What number can I call if I have questions?  When are the best times to check my blood glucose? Where to find more information:  American Diabetes Association: diabetes.org  Academy of Nutrition and Dietetics: www.eatright.[employer] of Diabetes and Digestive and Kidney Diseases (NIH): [employer] Summary  A  healthy meal plan will help you control your blood glucose and maintain a healthy lifestyle.  Working with a diet and nutrition specialist (dietitian) can help you make a meal plan that is best for you.  Keep in mind that carbohydrates (carbs) and alcohol have immediate effects on your blood glucose levels. It is important to count carbs and to use alcohol carefully. This information is not intended to replace advice given to you by your health care provider. Make sure you discuss any questions you have with your health care provider. Document Revised: 12/03/2016 Document Reviewed: 01/26/2016 Elsevier Patient Education  2020 [employer].

## 2019-08-16 NOTE — Assessment & Plan Note (Addendum)
Last A1C of 11.5 on 08/01/19. Pateint states he has been taking his novolog 11 units TID and lantus 17 units nightly. During his last visit Dr. Truman Hayward increased his lantus to 22 units, however, the patient states he has continued taking 17 units of lantus.   He denies checking his sugars after meals and stated he would like referral for diabetes/nutrition management.   Discussed with patient the need to avoid foods high in carbohydrates and sugars.  A/P - Continue Novolog 11 units TID with meals and Lantus 22 units nightly - Continue to check glucose levels before meals - Meet with [name] for diabetes nutrition management.  - Gave patient information on carbohydrate counting and diabetic diets.

## 2019-08-16 NOTE — Telephone Encounter (Signed)
Per Dr. Zenia Resides office, he is scheduled 08/30/19 at 230  PM to be seen in their office. Plan to follow up after that date for the report.

## 2019-08-16 NOTE — Assessment & Plan Note (Signed)
Encouraged patient to continue taking his Nexium.   A/P - Continue Nexium 40 mg capsule daily.

## 2019-08-16 NOTE — Assessment & Plan Note (Signed)
[name] states he has received both COVID vaccines.   Next appointment will input these dates into the computer.

## 2019-08-17 ENCOUNTER — Ambulatory Visit: Payer: Medicare HMO | Admitting: Podiatry

## 2019-08-17 NOTE — Addendum Note (Signed)
Addended by: Hulan Fray on: 08/17/2019 05:09 PM   Modules accepted: Orders

## 2019-08-17 NOTE — Progress Notes (Signed)
Internal Medicine Clinic Attending  I saw and evaluated the patient.  I personally confirmed the key portions of the history and exam documented by Dr. Johnney Ou and I reviewed pertinent patient test results.  The assessment, diagnosis, and plan were formulated together and I agree with the documentation in the resident's note.  Will need close follow up to try to improve control of his HTN and DM, but first need to clarify what he has and is taking at home and hopefully get him to check his sugars.   Lenice Pressman, M.D., Ph.D.

## 2019-08-21 ENCOUNTER — Telehealth: Payer: Self-pay | Admitting: *Deleted

## 2019-08-21 ENCOUNTER — Other Ambulatory Visit: Payer: Self-pay | Admitting: Student

## 2019-08-21 DIAGNOSIS — IMO0002 Reserved for concepts with insufficient information to code with codable children: Secondary | ICD-10-CM

## 2019-08-21 DIAGNOSIS — E1165 Type 2 diabetes mellitus with hyperglycemia: Secondary | ICD-10-CM

## 2019-08-21 MED ORDER — BASAGLAR KWIKPEN 100 UNIT/ML ~~LOC~~ SOPN: 22.0000 [IU] | PEN_INJECTOR | Freq: Every evening | SUBCUTANEOUS | 11 refills | Status: DC

## 2019-08-21 MED FILL — BASAGLAR 100 UNIT/ML KWIKPE: 100 | 68 days supply | Qty: 15 | Fill #0

## 2019-08-21 NOTE — Telephone Encounter (Signed)
Updated and sent to his pharmacy at [facility]

## 2019-08-21 NOTE — Telephone Encounter (Signed)
Fax from [facility]- Lantus solostar is not on their formulary. Please change to Basaglar.  Thanks

## 2019-09-05 ENCOUNTER — Encounter: Payer: Self-pay | Admitting: Dietician

## 2019-09-05 ENCOUNTER — Ambulatory Visit (INDEPENDENT_AMBULATORY_CARE_PROVIDER_SITE_OTHER): Payer: Medicare HMO | Admitting: Dietician

## 2019-09-05 ENCOUNTER — Other Ambulatory Visit: Payer: Self-pay

## 2019-09-05 DIAGNOSIS — E1165 Type 2 diabetes mellitus with hyperglycemia: Secondary | ICD-10-CM

## 2019-09-05 DIAGNOSIS — E118 Type 2 diabetes mellitus with unspecified complications: Secondary | ICD-10-CM

## 2019-09-05 DIAGNOSIS — IMO0002 Reserved for concepts with insufficient information to code with codable children: Secondary | ICD-10-CM

## 2019-09-05 NOTE — Patient Instructions (Signed)
Please record the time, amount and what food drinks and activities you have while wearing the continuous glucose monitor (CGM).  Bring the folder with you to follow up appointments. If your monitor falls off, please place it in the bag provided in your folder and bring it back with you to your next appointment.   Do not have a CT or an MRI while wearing the CGM.   1 week visit has been set up with me and a doctor for the first of two CGM downloads.   You will also return in 2 weeks to have your second download and the CGM removed.  Debera Lat, RD 09/05/2019 2:46 PM

## 2019-09-05 NOTE — Progress Notes (Addendum)
Diabetes Self-Management Education  Visit Type: First/Initial  Appt. Start Time: 1345 Appt. End Time: 3354  09/05/2019  [name], identified by name and date of birth, is a 59 y.o. male with a diagnosis of Diabetes: Type 2.   45 minutes were spent on Diabetes Self Management Training 15 minutes was spent on professional Continuous glucose monitor (CGM) placement  ASSESSMENT  Weight 181 lb 11.2 oz (82.4 kg). Body mass index is 24.64 kg/m.  Wt Readings from Last 10 Encounters:  09/05/19 181 lb 11.2 oz (82.4 kg)  08/16/19 176 lb 6.4 oz (80 kg)  08/01/19 179 lb 4.8 oz (81.3 kg)  05/31/19 179 lb 8 oz (81.4 kg)  04/05/19 171 lb 8 oz (77.8 kg)  03/30/19 186 lb (84.4 kg)  12/11/18 182 lb (82.6 kg)  11/27/18 182 lb (82.6 kg)  10/26/18 184 lb 6.4 oz (83.6 kg)  08/07/18 189 lb 14.4 oz (86.1 kg)   His weight is in a healthy range.  Calculated insulin needs ~ 0.5u/kg is 41 units/day Current insulin Total daily dose is 50 units/day  He reports that he lives alone and gives his insulin injections in his abdomen, but also reports lack of sleep and feeling sad after family members moved out.  Daughter comes over to cook for him 3 x/week . He stated that CGM would "help him do what he is supposed to do".   He states that his daughter checks his blood sugar 3x/day,but he does not look at them because he does not want the numbers to upset him. Sleep- not well lately. Was falling asleep some during today's visit.  Question if he has Diabetes Distress vs depression. Will screen at future visit with DDS2. Last PHQ-9 score was 4 but think it may be higher based on his answers today.  Mr. Salazar stated he is not interested in seeing our counselor, attending amputee support group. Agrees to podiatry referral- his feet are very high risk for more amputation. A referral  was placed recently and closed, but he did not know about it.  States that he does not have a regular eye doctor, he saw Dr.  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 He asked to discuss this at his next visit.   Documentation for Freestyle Libre Pro Continuous glucose monitoring Freestyle Libre Pro CGM sensor placed today. Patient was educated about wearing sensor, keeping food, activity and medication log and when to call office. Patient was educated about how to care for the sensor and not to have an MRI, CT or Diathermy while wearing the sensor. Follow up was arranged with the patient for 1 week.   Lot #: [id number] Serial #: [id number] Expiration Date: 12/04/2019  Debera Lat, RD 09/05/2019 4:37 PM. Lab Results  Component Value Date   HGBA1C 11.5 (A) 08/01/2019   HGBA1C 11.8 (A) 04/05/2019   HGBA1C 10.4 (A) 10/26/2018   HGBA1C 9.1 (A) 07/27/2018   HGBA1C 8.4 (H) 03/16/2018       Diabetes Self-Management Education - 09/05/19 1600      Visit Information   Visit Type First/Initial      Initial Visit   Diabetes Type Type 2    Are you currently following a meal plan? No    Are you taking your medications as prescribed? Yes      Health Coping   How would you rate your overall health? Fair  Psychosocial Assessment   Patient Belief/Attitude about Diabetes Defeat/Burnout    Self-care barriers Debilitated state due to current medical condition;Lack of transportation;Unsteady gait/risk for falls;Lack of material resources    Self-management support Family    Patient Concerns Problem Solving;Support    Special Needs None    Preferred Learning Style No preference indicated    Learning Readiness Ready   "willing to do whatever needs to be done"     Pre-Education Assessment   Patient understands the diabetes disease and treatment process. Needs Review    Patient understands incorporating nutritional management into lifestyle. Needs Review    Patient understands using medications safely. Needs Instruction    Patient understands  monitoring blood glucose, interpreting and using results Needs Instruction    Patient understands prevention, detection, and treatment of acute complications. Needs Review    Patient understands prevention, detection, and treatment of chronic complications. Needs Review    Patient understands how to develop strategies to address psychosocial issues. Needs Review    reports sadness/difficulty sleeping/ family issues     Complications   Last HgB A1C per patient/outside source 11.5 %    How often do you check your blood sugar? 3-4 times/day    Number of hypoglycemic episodes per month 0   denies   Have you had a dilated eye exam in the past 12 months? No   2017- PDR OS and Mod NPDR OD   Are you checking your feet? Yes    How many days per week are you checking your feet? 1      Dietary Intake   Breakfast 2 hot dogs and yoo hoo    Lunch water      Exercise   Exercise Type ADL's   amputations both feet and no feeling in feet up to midcalf     Patient Education   Previous Diabetes Education No    Nutrition management  Meal timing in regards to the patients' current diabetes medication.    Medications Reviewed patients medication for diabetes, action, purpose, timing of dose and side effects.    Monitoring Purpose and frequency of SMBG.;Daily foot exams;Other (comment)   rational for needing for data, separating feelings from data   Psychosocial adjustment Helped patient identify a support system for diabetes management;Identified and addressed patients feelings and concerns about diabetes    Personal strategies to promote health Helped patient develop diabetes management plan for (enter comment)   being closer to his goal of being  "normal"      Individualized Goals (developed by patient)   Monitoring  Other (comment)   wear cgm; keep records and return in 1 week     Outcomes   Expected Outcomes Demonstrated interest in learning. Expect positive outcomes    Future DMSE Other (comment)   1  week   Program Status Not Completed           Individualized Plan for Diabetes Self-Management Training:   Learning Objective:  Patient will have a greater understanding of diabetes self-management. Patient education plan is to attend individual and/or group sessions per assessed needs and concerns.   Plan:   Patient Instructions  Please record the time, amount and what food drinks and activities you have while wearing the continuous glucose monitor (CGM).  Bring the folder with you to follow up appointments. If your monitor falls off, please place it in the bag provided in your folder and bring it back with you to your next appointment.   Do  not have a CT or an MRI while wearing the CGM.   1 week visit has been set up with me and a doctor for the first of two CGM downloads.   You will also return in 2 weeks to have your second download and the CGM removed.  Debera Lat, RD 09/05/2019 2:46 PM   Expected Outcomes:  Demonstrated interest in learning. Expect positive outcomes  Education material provided: Diabetes Resources  If problems or questions, patient to contact team via:  Phone  Future DSME appointment: Other (comment) (1 week) consider CCM social work referral [employer] , [place] 09/05/2019 4:41 PM.

## 2019-09-06 ENCOUNTER — Telehealth: Payer: Self-pay | Admitting: Dietician

## 2019-09-06 ENCOUNTER — Encounter: Payer: Self-pay | Admitting: Student

## 2019-09-06 DIAGNOSIS — IMO0002 Reserved for concepts with insufficient information to code with codable children: Secondary | ICD-10-CM

## 2019-09-06 DIAGNOSIS — E1165 Type 2 diabetes mellitus with hyperglycemia: Secondary | ICD-10-CM

## 2019-09-06 NOTE — Telephone Encounter (Signed)
Patient requests shorter pen needles

## 2019-09-07 MED ORDER — INSULIN PEN NEEDLE 32G X 4 MM MISC: 3 refills | Status: DC

## 2019-09-07 MED FILL — UNIFINE PENTIPS 32GX5/32: 32G X 4 MM | 75 days supply | Qty: 300 | Fill #0

## 2019-09-12 ENCOUNTER — Ambulatory Visit: Payer: Medicare HMO | Admitting: Dietician

## 2019-09-12 ENCOUNTER — Encounter: Payer: Medicare HMO | Admitting: Internal Medicine

## 2019-11-09 ENCOUNTER — Encounter: Payer: Self-pay | Admitting: Internal Medicine

## 2019-11-09 ENCOUNTER — Ambulatory Visit (HOSPITAL_COMMUNITY)
Admission: RE | Admit: 2019-11-09 | Discharge: 2019-11-09 | Disposition: A | Discharge status: Home / Self Care | Payer: Medicare HMO | Source: Ambulatory Visit | Attending: Internal Medicine | Admitting: Internal Medicine

## 2019-11-09 ENCOUNTER — Telehealth: Payer: Self-pay | Admitting: *Deleted

## 2019-11-09 ENCOUNTER — Other Ambulatory Visit: Payer: Self-pay | Admitting: Internal Medicine

## 2019-11-09 ENCOUNTER — Other Ambulatory Visit: Payer: Self-pay

## 2019-11-09 ENCOUNTER — Ambulatory Visit (INDEPENDENT_AMBULATORY_CARE_PROVIDER_SITE_OTHER): Payer: Medicare HMO | Admitting: Internal Medicine

## 2019-11-09 VITALS — BP 114/92 | HR 84 | Temp 98.1°F | Wt 180.5 lb

## 2019-11-09 DIAGNOSIS — Z23 Encounter for immunization: Secondary | ICD-10-CM | POA: Diagnosis not present

## 2019-11-09 DIAGNOSIS — S81801A Unspecified open wound, right lower leg, initial encounter: Secondary | ICD-10-CM | POA: Diagnosis not present

## 2019-11-09 DIAGNOSIS — E1165 Type 2 diabetes mellitus with hyperglycemia: Secondary | ICD-10-CM

## 2019-11-09 DIAGNOSIS — T148XXD Other injury of unspecified body region, subsequent encounter: Secondary | ICD-10-CM

## 2019-11-09 DIAGNOSIS — R053 Chronic cough: Secondary | ICD-10-CM

## 2019-11-09 DIAGNOSIS — I739 Peripheral vascular disease, unspecified: Secondary | ICD-10-CM | POA: Diagnosis not present

## 2019-11-09 MED ORDER — FLUTICASONE PROPIONATE 50 MCG/ACT NA SUSP: NASAL | 0 refills | Status: DC

## 2019-11-09 MED ORDER — CETIRIZINE HCL 10 MG PO TABS: 10.0000 mg | ORAL_TABLET | Freq: Every day | ORAL | 2 refills | Status: DC

## 2019-11-09 MED ORDER — ALBUTEROL SULFATE HFA 108 (90 BASE) MCG/ACT IN AERS: 1.0000 | INHALATION_SPRAY | Freq: Four times a day (QID) | RESPIRATORY_TRACT | 1 refills | Status: DC | PRN

## 2019-11-09 MED FILL — FLUTICASONE PROP 50 MCG SPR: 50 | 60 days supply | Qty: 16 | Fill #0

## 2019-11-09 MED FILL — ALBUTEROL SULFATE HFA 108 (: 108 (90 BAS | 25 days supply | Qty: 18 | Fill #0

## 2019-11-09 NOTE — Assessment & Plan Note (Signed)
[name] states that he has had a chronic cough for a long time, however in the past couple weeks it has been bothering him more.  The cough is present throughout the day, but bothers him most at night.  He has some production with the cough but not a lot.  He denies any runny nose, nasal congestion, GERD, previous history of asthma or COPD.  Denies any fever, chills, or sick contacts.  He denies any chest pain or difficulty breathing.  Notes in the past when he did have a cough albuterol would help.  Assessment/plan: I suspect that patient's chronic cough is secondary to allergic rhinitis versus postnasal drip, especially as he was sniffling some during examination.  Differential also includes complication from his chronic right-sided pleural effusions that have been previously well documented versus a cough variant asthma given he has had benefit from albuterol inhaler.  We will start with symptomatic treatment as well as a chest x-ray.  Patient may benefit from PFTs in the future, but will hold off at this time.  -Refilled albuterol inhaler, fluticasone nasal spray -Start Zyrtec daily -Chest x-ray pending

## 2019-11-09 NOTE — Progress Notes (Signed)
   CC: Right leg wound; cough  HPI:  [name] is a 59 y.o. with a PMHx as listed below who presents to the clinic for right leg wound;cough.   Please see the Encounters tab for problem-based Assessment & Plan regarding status of patient's acute and chronic conditions.  Past Medical History:  Diagnosis Date  . CHF (congestive heart failure) ([place])   . Critical lower limb ischemia, lt with ABI of 0.60 12/31/2011  . Gangrene ([place])    right foot  . GERD (gastroesophageal reflux disease)   . Hyperlipidemia   . Hypertension   . Neuromuscular disorder ([place])    diabetic neruopathy - hands  . Osteomyelitis ([place]) 2010   left foot, s/p midfoot amputation  . 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 PAD (peripheral artery disease) ([place])    ABIs 11/30/11: L ABI 0.68, R ABI 0.84  . Pneumonia 2010  . PVD (peripheral vascular disease) ([place]) 12/31/2011  . S/P angioplasty with stent, 12/30/11, of Lt SFA, Post. tibialis and PTA of L. ant and post. tibial vessels 12/31/2011  . SOB (shortness of breath)    uses inhaler prn  . Type II diabetes mellitus ([place]) ~ 2002   Review of Systems: Review of Systems  Constitutional: Negative for chills, fever, malaise/fatigue and weight loss.  Respiratory: Positive for cough and sputum production. Negative for shortness of breath and wheezing.   Cardiovascular: Negative for chest pain, palpitations, orthopnea and leg swelling.  Gastrointestinal: Negative for abdominal pain, diarrhea, nausea and vomiting.  Musculoskeletal: Positive for falls and joint pain.  Skin:       + wound  Neurological: Negative for dizziness, tingling and headaches.   Physical Exam:  Vitals:   11/09/19 1510  BP: (!) 114/92  Pulse: 84  Temp: 98.1 F (36.7 C)  TempSrc: Oral  SpO2: 98%  Weight: 180 lb 8 oz (81.9 kg)   Physical Exam Vitals and nursing note reviewed.  Constitutional:      General:  He is not in acute distress.    Appearance: He is normal weight.  HENT:     Mouth/Throat:     Mouth: Mucous membranes are moist.     Pharynx: Oropharynx is clear. No oropharyngeal exudate or posterior oropharyngeal erythema.  Cardiovascular:     Rate and Rhythm: Normal rate and regular rhythm.     Pulses: Decreased pulses.     Heart sounds: No murmur heard.  No gallop.      Comments: Bilateral radial pulses only detectable with Doppler. Pulmonary:     Effort: Pulmonary effort is normal. No respiratory distress.     Breath sounds: Normal breath sounds. No stridor. No wheezing, rhonchi or rales.  Skin:    General: Skin is warm and dry.     Comments: Please see below for images of right knee.  No purulent drainage or increased warmth.  Right lateral knee wound is slightly fluctuant and swollen.  Neurological:     General: No focal deficit present.     Mental Status: He is alert and oriented to person, place, and time.  Psychiatric:        Mood and Affect: Mood normal.        Behavior: Behavior normal.          Assessment & Plan:   See Encounters Tab for problem based charting.  Patient discussed with Dr. Heber Osprey

## 2019-11-09 NOTE — Assessment & Plan Note (Signed)
[name] states that he fell approximately 1 month ago, and hit the superior aspect of his right knee, which point he had a wound there.  The wound has not been healing up at all despite him keeping it clean, washing it daily and keeping it covered.  He has been washing it with hydrogen peroxide.  Then he noticed approximately 1 week ago that his stump had been rubbing him on the lateral aspect of his right knee, causing a wound to form there.  The wound has been gradually getting larger.  He denies any fever, chills, purulent drainage from either wound.  Please see media tab for images.  Assessment/plan: Poorly healing wounds likely in light of patient's uncontrolled diabetes and PVD.  We will place an urgent referral to vascular surgery for evaluation, and patient will likely need referral to wound clinic as well.  Wounds were dressed here today.  We will have him follow-up in 2 weeks.

## 2019-11-09 NOTE — Patient Instructions (Signed)
It was nice seeing you today! Thank you for choosing [facility] for your Primary Care.    Today we talked about:   Knee wounds and Cold Hands:   - I am concerned about your vein and artery disease. I would like for you to see the Vascular Surgeon as  soon as possible. I have placed this referral in today. You will be contacted for the appointment soon,.  - Please make sure you are taking Crestor (Rosuvastatin) daily   - Work on quitting smoking all the way   Cough:   - For this, I have refilled your Albuterol inhaler and Flonase nasal spray. I have also added Zyrtec.   - I have ordered a chest xray.

## 2019-11-09 NOTE — Telephone Encounter (Signed)
Patient called in with his friend, Delores. Patient with left and right BKAs, states he fell last week and now there is a quarter-sized open wound at right stump with bloody drainage. Denies fevers/chills. Appt given for today with [facility]. Hubbard Hartshorn, BSN, RN-BC

## 2019-11-09 NOTE — Assessment & Plan Note (Addendum)
[name] notes that he has been noticing his bilateral fingers and hands have been cold despite wearing gloves and keeping his hands covered, even when he is indoors.  He feels like due to this, has been having increasing stiffness of his hands.  He denies any wounds to his fingertips.  Assessment/plan: Bilateral radial pulses are decreased and only detectable by Doppler.  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  He has not followed up with vascular surgery since 2017.  In addition, his poor wound healing of his right knee is very likely secondary to his poor perfusion overall and represents an ischemic wound.    He is unsure if he is taking his Crestor, but looks like refills have been provided.  -Urgent vascular surgery referral placed

## 2019-11-13 ENCOUNTER — Encounter: Payer: Self-pay | Admitting: Internal Medicine

## 2019-11-13 ENCOUNTER — Telehealth: Payer: Self-pay | Admitting: Internal Medicine

## 2019-11-13 NOTE — Telephone Encounter (Signed)
Contacted [name] to discuss his chest x-ray results.  Explained that on his chest x-ray, there is no indication of why he has a chronic cough given that there improvements in the pleural effusion.  Explained that we will discuss next steps at his next visit on the 18th.  Reviewed the referral to vascular surgery with [name] and noted that he was unable to be contacted and a voice message was unable to be left.  [name] is aware that his voice box is full and apologizes.  He does not have a pen on him or near him and so I discussed with him that I will send a MyChart message with the number to call for vascular surgery.  He states he will contact them soon as possible.  [name] also requests that pain management be discussed at his next visit.

## 2019-11-13 NOTE — Progress Notes (Signed)
Internal Medicine Clinic Attending  Case discussed with Dr. Basaraba  At the time of the visit.  We reviewed the resident's history and exam and pertinent patient test results.  I agree with the assessment, diagnosis, and plan of care documented in the resident's note.  

## 2019-11-14 ENCOUNTER — Telehealth: Payer: Self-pay

## 2019-11-14 NOTE — Telephone Encounter (Signed)
Per Davy Pique is unable to get in touch the patient, Davy Pique is wanting you to contact patient this is mark as urgent [phone]

## 2019-11-15 ENCOUNTER — Other Ambulatory Visit: Payer: Self-pay

## 2019-11-15 DIAGNOSIS — I739 Peripheral vascular disease, unspecified: Secondary | ICD-10-CM

## 2019-11-15 NOTE — Telephone Encounter (Signed)
Spoke with the patient and [facility] and the patien thas now been sch on 11/16/2019 with their clinic.

## 2019-11-16 ENCOUNTER — Encounter: Payer: Medicare HMO | Admitting: Vascular Surgery

## 2019-11-16 ENCOUNTER — Encounter (HOSPITAL_COMMUNITY): Payer: Medicare HMO

## 2019-11-20 ENCOUNTER — Encounter: Payer: Self-pay | Admitting: Student

## 2019-11-22 ENCOUNTER — Encounter: Payer: Medicare HMO | Admitting: Student

## 2019-11-23 ENCOUNTER — Ambulatory Visit (INDEPENDENT_AMBULATORY_CARE_PROVIDER_SITE_OTHER): Payer: Medicare HMO | Admitting: Student

## 2019-11-23 ENCOUNTER — Encounter: Payer: Self-pay | Admitting: Student

## 2019-11-23 ENCOUNTER — Other Ambulatory Visit: Payer: Self-pay

## 2019-11-23 VITALS — BP 137/68 | HR 81 | Temp 97.8°F | Ht 72.0 in | Wt 180.8 lb

## 2019-11-23 DIAGNOSIS — Z89432 Acquired absence of left foot: Secondary | ICD-10-CM

## 2019-11-23 DIAGNOSIS — I10 Essential (primary) hypertension: Secondary | ICD-10-CM | POA: Diagnosis not present

## 2019-11-23 DIAGNOSIS — S81801A Unspecified open wound, right lower leg, initial encounter: Secondary | ICD-10-CM

## 2019-11-23 DIAGNOSIS — R053 Chronic cough: Secondary | ICD-10-CM

## 2019-11-23 DIAGNOSIS — K219 Gastro-esophageal reflux disease without esophagitis: Secondary | ICD-10-CM

## 2019-11-23 DIAGNOSIS — E118 Type 2 diabetes mellitus with unspecified complications: Secondary | ICD-10-CM

## 2019-11-23 DIAGNOSIS — E1165 Type 2 diabetes mellitus with hyperglycemia: Secondary | ICD-10-CM | POA: Diagnosis not present

## 2019-11-23 DIAGNOSIS — I739 Peripheral vascular disease, unspecified: Secondary | ICD-10-CM | POA: Diagnosis not present

## 2019-11-23 DIAGNOSIS — IMO0002 Reserved for concepts with insufficient information to code with codable children: Secondary | ICD-10-CM

## 2019-11-23 DIAGNOSIS — Z0000 Encounter for general adult medical examination without abnormal findings: Secondary | ICD-10-CM

## 2019-11-23 HISTORY — DX: Acquired absence of left foot: Z89.432

## 2019-11-23 LAB — POCT GLYCOSYLATED HEMOGLOBIN (HGB A1C): Hemoglobin A1C: 12.1 % — AB (ref 4.0–5.6)

## 2019-11-23 LAB — GLUCOSE, CAPILLARY: Glucose-Capillary: 296 mg/dL — ABNORMAL HIGH (ref 70–99)

## 2019-11-23 MED ORDER — PREGABALIN 50 MG PO CAPS: 50.0000 mg | ORAL_CAPSULE | Freq: Two times a day (BID) | ORAL | 0 refills | Status: DC

## 2019-11-23 MED ORDER — INSULIN ASPART 100 UNIT/ML FLEXPEN: 11.0000 [IU] | PEN_INJECTOR | Freq: Three times a day (TID) | SUBCUTANEOUS | 2 refills | Status: DC

## 2019-11-23 MED ORDER — ESOMEPRAZOLE MAGNESIUM 40 MG PO CPDR: 40.0000 mg | DELAYED_RELEASE_CAPSULE | Freq: Every day | ORAL | 3 refills | Status: DC

## 2019-11-23 MED ORDER — HYDROCHLOROTHIAZIDE 12.5 MG PO TABS: 25.0000 mg | ORAL_TABLET | Freq: Every day | ORAL | 6 refills | Status: DC

## 2019-11-23 MED ORDER — PREGABALIN 50 MG PO CAPS: 50.0000 mg | ORAL_CAPSULE | Freq: Three times a day (TID) | ORAL | 2 refills | Status: DC

## 2019-11-23 MED ORDER — BASAGLAR KWIKPEN 100 UNIT/ML ~~LOC~~ SOPN: 22.0000 [IU] | PEN_INJECTOR | Freq: Every evening | SUBCUTANEOUS | 11 refills | Status: DC

## 2019-11-23 MED FILL — NOVOLOG FLEXPEN SYRINGE: 100 | 90 days supply | Qty: 30 | Fill #0

## 2019-11-23 MED FILL — ESOMEPRAZOLE MAG DR 40 MG C: 40 | 90 days supply | Qty: 90 | Fill #0

## 2019-11-23 MED FILL — BASAGLAR 100 UNIT/ML KWIKPE: 100 | 68 days supply | Qty: 15 | Fill #0

## 2019-11-23 MED FILL — HYDROCHLOROTHIAZIDE 12.5 MG: 12.5 | 45 days supply | Qty: 90 | Fill #0

## 2019-11-23 NOTE — Assessment & Plan Note (Signed)
Assessment: Patient with pain to left stump. Area is clean and dry. No signs of infection; no erythema, warmth, swelling, deformity, abrasion, laceration, or abscess. Area is not tender to palpate. Suspect patient may be having some phantom limb pain. Also discussed with him the need to check stump frequently as he has a history of uncontrolled diabetes. Patient also non-adherent to pain medication regimen for phantom limb pain of duloxetine, gabapentin.  Plan: - Discontinued gabapentin, start pregabalin.

## 2019-11-23 NOTE — Assessment & Plan Note (Signed)
Assessment: Patient endorses difficulty with keeping up with medications and that his memory has worsened over past few years. Offered patient to meet with social work however he denied at this time.   Plan: - Will have patient follow up with social work if he would like in the future.

## 2019-11-23 NOTE — Assessment & Plan Note (Addendum)
Assessment: Patient missed appointment with vascular surgery that was ordered by Dr. Charleen Kirks. Gave patient information to call vascular office and reschedule.   Also discussed with patient that being non-adherent to his diabetes medication will cause worsening of his PVD. Also discussed need to decrease smoking as this will also worsen PVD.    Plan: - Patient to reschedule referral with vascular.

## 2019-11-23 NOTE — Assessment & Plan Note (Addendum)
Assessment: Patient continues to endorse chronic cough, worse at night. Denies coughing up blood, states cough is dry. He denies history of GERD, but history of it in documentation. Due to history of GERD and globus sensation, suspect patient's non adherence to his nexium maybe contributing. Also have stopped ACEi as stopping ARB improved patient's cough in the past.  Patient also continues to smoke two cigarettes a week. Suspect this may be contributing, patient would not like to quit despite discussing with him that this may improve his cough  Chest x-ray repeated 11/12/19, pleural effusion has improved since prior imaging.    Plan: - Stop ACEi.  - Continue nexium - smoking cessation given - Reassess in coming weeks - If continues to have cough despite adherence, will consider PFT's

## 2019-11-23 NOTE — Assessment & Plan Note (Signed)
Assessment: BP of 137/68. Current regimen of amlodipine 10 mg, carvedilol 6.25 mg BID, lisinopril-hctz 10-12.5 mg BID.   Patient with chronic cough and history of cough with ace and arbs in documentation from 2015. Because of this, will stop ACE and not put on ARB as replacement. Will assess how cough does in following weeks.  Plan: - Stop lisinopril-HCTZ - Start HCTZ 25 mg daily - Continue amlodipine and coreg

## 2019-11-23 NOTE — Progress Notes (Signed)
CC: chronic cough, cold hands, left stump pain  HPI:  [name] is a 59 y.o. male with a past medical history stated below and presents today for chronic cough, neuropathic pain, and cold hands. Please see problem based assessment and plan for additional details.  Past Medical History:  Diagnosis Date  . CHF (congestive heart failure) ([place])   . Critical lower limb ischemia, lt with ABI of 0.60 12/31/2011  . Gangrene ([place])    right foot  . GERD (gastroesophageal reflux disease)   . Hyperlipidemia   . Hypertension   . Neuromuscular disorder ([place])    diabetic neruopathy - hands  . Osteomyelitis ([place]) 2010   left foot, s/p midfoot amputation  . 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 PAD (peripheral artery disease) ([place])    ABIs 11/30/11: L ABI 0.68, R ABI 0.84  . Pneumonia 2010  . PVD (peripheral vascular disease) ([place]) 12/31/2011  . S/P angioplasty with stent, 12/30/11, of Lt SFA, Post. tibialis and PTA of L. ant and post. tibial vessels 12/31/2011  . SOB (shortness of breath)    uses inhaler prn  . Type II diabetes mellitus ([place]) ~ 2002    Current Outpatient Medications on File Prior to Visit  Medication Sig Dispense Refill  . Accu-Chek FastClix Lancets MISC Check blood sugar 3 times a day 102 each 12  . albuterol (VENTOLIN HFA) 108 (90 Base) MCG/ACT inhaler Inhale 1-2 puffs into the lungs every 6 (six) hours as needed (wheezing or cough). 8 g 1  . amLODipine (NORVASC) 10 MG tablet Take 1 tablet (10 mg total) by mouth daily. 90 tablet 3  . blood glucose meter kit and supplies KIT Dispense based on patient and insurance preference. Use up to four times daily as directed. (FOR ICD-9 250.00, 250.01). 1 each 0  . Blood Glucose Monitoring Suppl (ACCU-CHEK AVIVA PLUS) w/Device KIT Check blood sugar 3 times a day 1 kit 0  . carvedilol (COREG) 6.25 MG tablet Take 1 tablet (6.25 mg total) by mouth 2 (two)  times daily with a meal. 180 tablet 3  . cetirizine (ZYRTEC ALLERGY) 10 MG tablet Take 1 tablet (10 mg total) by mouth daily. 30 tablet 2  . DULoxetine (CYMBALTA) 30 MG capsule Take 1 capsule (30 mg total) by mouth daily. 90 capsule 1  . fluticasone (FLONASE) 50 MCG/ACT nasal spray USE 1 SPRAY INTO BOTH NOSTRILS DAILY. 16 g 0  . glucose blood (ACCU-CHEK AVIVA PLUS) test strip Check blood sugar 3 times a day 100 each 12  . Insulin Pen Needle 32G X 4 MM MISC Use to inject insulin 4 times a day 360 each 3  . rosuvastatin (CRESTOR) 20 MG tablet Take 1 tablet (20 mg total) by mouth daily. 90 tablet 3   No current facility-administered medications on file prior to visit.    Family History  Problem Relation Age of Onset  . Diabetes Mother   . Hypertension Brother   . Hypertension Sister   . Anesthesia problems Neg Hx   . Colon cancer Neg Hx   . Rectal cancer Neg Hx   . Stomach cancer Neg Hx     Social History   Socioeconomic History  . Marital status: Single    Spouse name: Not on file  . Number of children: Not on file  . Years of education: 11th  . Highest education level: Not  on file  Occupational History    Employer: [employer]  Tobacco Use  . Smoking status: Current Some Day Smoker    Years: 24.00    Types: Cigarettes  . Smokeless tobacco: Never Used  . Tobacco comment: STARTED BACK SMOKING 2-017. 1 pk/week  Vaping Use  . Vaping Use: Never used  Substance and Sexual Activity  . Alcohol use: Not Currently    Alcohol/week: 0.0 standard drinks  . Drug use: No  . Sexual activity: Yes    Partners: Female    Birth control/protection: Condom    Comment: one partner  Other Topics Concern  . 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 Difficulty of Paying Living Expenses: Not on file  Food Insecurity:   . Worried About Charity fundraiser in the Last Year: Not on file  . Ran Out of Food in the Last Year: Not on file  Transportation Needs:   . Lack of Transportation (Medical): Not on file  . Lack of Transportation (Non-Medical): Not on file  Physical Activity:   . Days of Exercise per Week: Not on file  . Minutes of Exercise per Session: Not on file  Stress:   . Feeling of Stress : Not on file  Social Connections:   . Frequency of Communication with Friends and Family: Not on file  . Frequency of Social Gatherings with Friends and Family: Not on file  . Attends Religious Services: Not on file  . Active Member of Clubs or Organizations: Not on file  . Attends Archivist Meetings: Not on file  . Marital Status: Not on file  Intimate Partner Violence:   . Fear of Current or Ex-Partner: Not on file  . Emotionally Abused: Not on file  . Physically Abused: Not on file  . Sexually Abused: Not on file    Review of Systems: ROS negative except for what is noted on the assessment and plan.  Vitals:   11/23/19 1432  BP: 137/68  Pulse: 81  Temp: 97.8 F (36.6 C)  TempSrc: Oral  SpO2: 100%  Weight: 180 lb 12.8 oz (82 kg)  Height: 6' (1.829 m)     Physical Exam: Physical Exam Vitals and nursing note reviewed.  Constitutional:      Appearance: Normal appearance.  HENT:     Head: Normocephalic and atraumatic.  Cardiovascular:     Rate and Rhythm: Normal rate and regular rhythm.     Pulses: Normal pulses.     Heart sounds: Normal heart sounds. No murmur heard.  No friction rub. No gallop.   Pulmonary:     Effort: Pulmonary effort is normal. No respiratory distress.     Breath sounds: Normal breath sounds. No stridor. No wheezing, rhonchi or rales.  Musculoskeletal:     Comments: Right sided BKA. Left sided transmetatarsal amputation. Left stump site  clean, dry. Non-tender. No erythema or swelling present.   Bilateral hands are cool to touch. Weak bilateral radial pulses.   Neurological:     General: No focal deficit present.     Mental Status: He is alert and oriented to person, place, and time. Mental status is  at baseline.  Psychiatric:        Mood and Affect: Mood normal.        Behavior: Behavior normal.      Assessment & Plan:   See Encounters Tab for problem based charting.  Patient seen with Dr. Delano Metz, D.O. [facility], PGY-1 Pager: [phone], Phone: [phone] Date 11/23/2019 Time 4:58 PM

## 2019-11-23 NOTE — Patient Instructions (Addendum)
Thank you, Mr.Breckin Hasz for allowing [place] to provide your care today. Today we discussed your cough. Please continue to take your reflux medicine and attempt to decrease smoking. We will also be changing your blood pressure medications For your pain we will switch you from gabapentin to pregabalin. For your cold hands, please try to stop smoking and follow up with the vascular surgeons.      I have ordered the following labs for you:   Lab Orders     POC Hbg A1C    Referrals ordered today:   Referral Orders  No referral(s) requested today     I have ordered the following medication/changed the following medications:   Stop the following medications: Medications Discontinued During This Encounter  Medication Reason  . esomeprazole (NEXIUM) 40 MG capsule Reorder  . insulin aspart (NOVOLOG) 100 UNIT/ML FlexPen Reorder  . Insulin Glargine (BASAGLAR KWIKPEN) 100 UNIT/ML Reorder  . gabapentin (NEURONTIN) 800 MG tablet Change in therapy  . lisinopril-hydrochlorothiazide (ZESTORETIC) 10-12.5 MG tablet Change in therapy  . pregabalin (LYRICA) 50 MG capsule      Start the following medications: Meds ordered this encounter  Medications  . Insulin Glargine (BASAGLAR KWIKPEN) 100 UNIT/ML    Sig: Inject 22 Units into the skin at bedtime.    Dispense:  15 mL    Refill:  11  . insulin aspart (NOVOLOG) 100 UNIT/ML FlexPen    Sig: Inject 11 Units into the skin 3 (three) times daily with meals.    Dispense:  45 mL    Refill:  2  . esomeprazole (NEXIUM) 40 MG capsule    Sig: Take 1 capsule (40 mg total) by mouth daily.    Dispense:  90 capsule    Refill:  3  . DISCONTD: pregabalin (LYRICA) 50 MG capsule    Sig: Take 1 capsule (50 mg total) by mouth 3 (three) times daily.    Dispense:  90 capsule    Refill:  2  . hydrochlorothiazide (HYDRODIURIL) 12.5 MG tablet    Sig: Take 2 tablets (25 mg total) by mouth daily.    Dispense:  90 tablet    Refill:  6  . pregabalin (LYRICA) 50 MG  capsule    Sig: Take 1 capsule (50 mg total) by mouth 2 (two) times daily.    Dispense:  60 capsule    Refill:  0     Follow up: 1 month     Should you have any questions or concerns please call the internal medicine clinic at [phone].     Sanjuana Letters, D.O. [place]

## 2019-11-23 NOTE — Assessment & Plan Note (Signed)
Assessment: Patient states he has been out of his insulin for the past few weeks. A1c increased 12.1 from 11.5. discussed with patient importance of improving A1c as it will delay wound healing and cause worsening of his PVD.   Plan: - Insulin reordered.  - A1c 12.1 - Reassess in 3 months

## 2019-11-26 ENCOUNTER — Telehealth: Payer: Self-pay | Admitting: *Deleted

## 2019-11-26 MED FILL — PREGABALIN 50 MG CAPS: 50 | 30 days supply | Qty: 60 | Fill #0

## 2019-11-26 NOTE — Telephone Encounter (Addendum)
Information was sent through CoverMyMeds for PA for Pregabalin 50 mg Capsules.  Awaiting determination within 72 hours.  Call [phone] for updates and questions.  Sander Nephew, RN 11/26/2019 9:51 AM. PA for Pregabalin 50 mg capsules is approved from 05/05/2019 thru 01/04/2020.  Sander Nephew, RN 11/26/2019 1;23 PM.

## 2019-11-27 NOTE — Progress Notes (Signed)
Internal Medicine Clinic Attending  I saw and evaluated the patient.  I personally confirmed the key portions of the history and exam documented by Dr. Johnney Ou and I reviewed pertinent patient test results.  The assessment, diagnosis, and plan were formulated together and I agree with the documentation in the resident's note. I reviewed the chart with Dr Johnney Ou, clearly in 2015 olmesartan was discontinued due to cough with improvement, however somehow this later got added as an allergy due to ARF (which was actually documented as due to dehydration).  Given he is back with a chronic cough and on an ace inhibitor we will start with discontinuation of the ACEi.

## 2019-12-19 NOTE — Addendum Note (Signed)
Addended by: Hulan Fray on: 12/19/2019 06:55 AM   Modules accepted: Orders

## 2020-01-02 ENCOUNTER — Other Ambulatory Visit: Payer: Self-pay | Admitting: Internal Medicine

## 2020-01-02 ENCOUNTER — Ambulatory Visit (INDEPENDENT_AMBULATORY_CARE_PROVIDER_SITE_OTHER): Payer: Medicare HMO | Admitting: Internal Medicine

## 2020-01-02 DIAGNOSIS — IMO0002 Reserved for concepts with insufficient information to code with codable children: Secondary | ICD-10-CM

## 2020-01-02 DIAGNOSIS — E118 Type 2 diabetes mellitus with unspecified complications: Secondary | ICD-10-CM

## 2020-01-02 DIAGNOSIS — I10 Essential (primary) hypertension: Secondary | ICD-10-CM | POA: Diagnosis not present

## 2020-01-02 DIAGNOSIS — R053 Chronic cough: Secondary | ICD-10-CM

## 2020-01-02 DIAGNOSIS — K219 Gastro-esophageal reflux disease without esophagitis: Secondary | ICD-10-CM

## 2020-01-02 DIAGNOSIS — E1165 Type 2 diabetes mellitus with hyperglycemia: Secondary | ICD-10-CM

## 2020-01-02 DIAGNOSIS — E1142 Type 2 diabetes mellitus with diabetic polyneuropathy: Secondary | ICD-10-CM

## 2020-01-02 LAB — GLUCOSE, CAPILLARY: Glucose-Capillary: 265 mg/dL — ABNORMAL HIGH (ref 70–99)

## 2020-01-02 MED ORDER — HYDROCHLOROTHIAZIDE 12.5 MG PO TABS: 25.0000 mg | ORAL_TABLET | Freq: Every day | ORAL | 6 refills | Status: DC

## 2020-01-02 MED ORDER — CARVEDILOL 6.25 MG PO TABS: 6.2500 mg | ORAL_TABLET | Freq: Two times a day (BID) | ORAL | 3 refills | Status: DC

## 2020-01-02 MED ORDER — PREGABALIN 50 MG PO CAPS: 50.0000 mg | ORAL_CAPSULE | Freq: Two times a day (BID) | ORAL | 0 refills | Status: DC

## 2020-01-02 MED ORDER — DULOXETINE HCL 30 MG PO CPEP: 30.0000 mg | ORAL_CAPSULE | Freq: Every day | ORAL | 1 refills | Status: DC

## 2020-01-02 MED ORDER — ESOMEPRAZOLE MAGNESIUM 40 MG PO CPDR: 40.0000 mg | DELAYED_RELEASE_CAPSULE | Freq: Every day | ORAL | 3 refills | Status: DC

## 2020-01-02 MED ORDER — AMLODIPINE BESYLATE 10 MG PO TABS: 10.0000 mg | ORAL_TABLET | Freq: Every day | ORAL | 3 refills | Status: DC

## 2020-01-02 NOTE — Patient Instructions (Addendum)
Thank you for allowing [place] to provide your care today. Today we discussed your finger pain    I have ordered no labs for you. I will call if any are abnormal.    Today we made no changes to your medications.    Please make sure to pick up your medicine and take them as prescribed  I will make a referral for you to speak with our clinical pharmacist  Please follow-up in 2 months.    Should you have any questions or concerns please call the internal medicine clinic at [phone].     Peripheral Vascular Disease  Peripheral vascular disease (PVD) is a disease of the blood vessels that are not part of your heart and brain. A simple term for PVD is poor circulation. In most cases, PVD narrows the blood vessels that carry blood from your heart to the rest of your body. This can reduce the supply of blood to your arms, legs, and internal organs, like your stomach or kidneys. However, PVD most often affects a person's lower legs and feet. Without treatment, PVD tends to get worse. PVD can also lead to acute ischemic limb. This is when an arm or leg suddenly cannot get enough blood. This is a medical emergency. Follow these instructions at home: Lifestyle  Do not use any products that contain nicotine or tobacco, such as cigarettes and e-cigarettes. If you need help quitting, ask your doctor.  Lose weight if you are overweight. Or, stay at a healthy weight as told by your doctor.  Eat a diet that is low in fat and cholesterol. If you need help, ask your doctor.  Exercise regularly. Ask your doctor for activities that are right for you. General instructions  Take over-the-counter and prescription medicines only as told by your doctor.  Take good care of your feet: ? Wear comfortable shoes that fit well. ? Check your feet often for any cuts or sores.  Keep all follow-up visits as told by your doctor This is important. Contact a doctor if:  You have cramps in your legs when you  walk.  You have leg pain when you are at rest.  You have coldness in a leg or foot.  Your skin changes.  You are unable to get or have an erection (erectile dysfunction).  You have cuts or sores on your feet that do not heal. Get help right away if:  Your arm or leg turns cold, numb, and blue.  Your arms or legs become red, warm, swollen, painful, or numb.  You have chest pain.  You have trouble breathing.  You suddenly have weakness in your face, arm, or leg.  You become very confused or you cannot speak.  You suddenly have a very bad headache.  You suddenly cannot see. Summary  Peripheral vascular disease (PVD) is a disease of the blood vessels.  A simple term for PVD is poor circulation. Without treatment, PVD tends to get worse.  Treatment may include exercise, low fat and low cholesterol diet, and quitting smoking. This information is not intended to replace advice given to you by your health care provider. Make sure you discuss any questions you have with your health care provider. Document Revised: 12/03/2016 Document Reviewed: 01/29/2016 Elsevier Patient Education  2020 [employer].

## 2020-01-03 ENCOUNTER — Encounter: Payer: Self-pay | Admitting: Internal Medicine

## 2020-01-03 NOTE — Progress Notes (Signed)
CC: Finger pain  HPI: [name] is a 59 y.o. with PMH listed below presenting with complaint of finger pain. Please see problem based assessment and plan for further details.  Past Medical History:  Diagnosis Date  . CHF (congestive heart failure) ([place])   . Critical lower limb ischemia, lt with ABI of 0.60 12/31/2011  . Gangrene ([place])    right foot  . GERD (gastroesophageal reflux disease)   . Hyperlipidemia   . Hypertension   . Neuromuscular disorder ([place])    diabetic neruopathy - hands  . Osteomyelitis ([place]) 2010   left foot, s/p midfoot amputation  . 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 PAD (peripheral artery disease) ([place])    ABIs 11/30/11: L ABI 0.68, R ABI 0.84  . Pneumonia 2010  . PVD (peripheral vascular disease) ([place]) 12/31/2011  . S/P angioplasty with stent, 12/30/11, of Lt SFA, Post. tibialis and PTA of L. ant and post. tibial vessels 12/31/2011  . SOB (shortness of breath)    uses inhaler prn  . Type II diabetes mellitus ([name]) ~ 2002    Review of Systems: Review of Systems  Constitutional: Negative for chills, fever and malaise/fatigue.  Eyes: Negative for blurred vision.  Respiratory: Positive for cough. Negative for shortness of breath.   Cardiovascular: Negative for chest pain and palpitations.  Gastrointestinal: Negative for constipation, diarrhea, nausea and vomiting.  Genitourinary: Negative for dysuria and urgency.  Neurological: Positive for tingling and sensory change. Negative for dizziness and weakness.  All other systems reviewed and are negative.    Physical Exam: Vitals:   01/02/20 1342 01/02/20 1447  BP: (!) 190/94 (!) 156/98  Pulse: 89   Temp: (!) 97.5 F (36.4 C)   TempSrc: Oral   SpO2: 100%   Weight: 191 lb (86.6 kg)    Gen: Well-developed, chronically ill-appearing, NAD HEENT: NCAT head, hearing intact CV: RRR, S1, S2 normal Pulm: CTAB, No  rales, no wheezes Extm: Stable RLE bka, bilateral upper extremity peripheral pulses intact Skin: Dry skin on RLE stump with well-healing wound with scab on anterior knee  Assessment & Plan:   Essential hypertension BP Readings from Last 3 Encounters:  01/02/20 (!) 156/98  11/23/19 137/68  11/09/19 (!) 114/92   Previously better controlled with amlodipine, carvedilol, lisinopirl-hctz. On last visit advised to change lisinopril-hctz to hctz 25mg  daily. Mr.Bolon mentions he has not taken any of his bp meds for the last 4 weeks as he did not know that the new medication was available to him at the pharmacy.  - Counseled on medication adherence - C/w amlodipine 10mg , carveidlol 6.25mg  , hctz 25mg   Chronic cough Was seen by Dr.Katsadouros for same complaint. Continuing to endorse chronic cough. Mentions not taking his Nexium since his last visit here. Continuing to smoke.   A/P Chronic pain work-up complicated by medication non-adherence and socio-economic barriers. - Nexium refill sent - Advised to pick up his medications and take as prescribed  Diabetic peripheral neuropathy Mr.Carrozza is a 59 yo M w/ PMH of uncontrolled T2DM, CKD4-5, HTN, PVD s/p R BKA, HLD and tobacco abuse presenting to [facility] w/ complaint of right upper extremity tingling. He mentions feeling numb and tingling at the periphery of his finger tips. He mentions these sensation have been ongoing for many months and he is requesting medication for pain. Discussed prior evaluation by Dr.Katsadouros and change in his symptoms  when his gabapentin was changed to Lyrica. He mentions that he does not have reliable transportation and did not pick up his medications. Also mentions that he did not know that his meds were available because the pharmacy did not call him.  A/P Continuing to endorse neuropathy in setting of medication non-adherence. Complicated by socio-economic barriers. Refills for chronic meds including Lyrica sent to  pharmacy. Also will make referral for assistance with medication adherence with clinical pharmacist  - Referral for clinical pharmacy - Refills for chronic meds sent  GERD (gastroesophageal reflux disease) Pt requires refills on medications with associated diagnosis above.  Reviewed disease process and find this medication to be necessary, will not change dose or alter current therapy.  Diabetes mellitus type 2, uncontrolled, with complications Lab Results  Component Value Date   HGBA1C 12.1 (A) 11/23/2019   Mr.Quesinberry presents with complaint of neuropathy symptoms. Mentions taking his insulin as prescribed. Although on teachback, noted to be taking glargine at 17 units instead of prescribed 22 units Denies hypoglycemic symptoms. Had prolonged discussion regarding sequelae of T2Dm being neuropathy and importance of managing his diabetes well to prevent worsening neuropathy symptoms. Mr.Goettl expressed understanding.  A/P Did not bring glucometer. Random blood sugar in clinic at 260. Need better adherence. Has history of non-adherence. Feet checked this visit with multiple pressure wounds on stump that is well-healing. Advised to use appropriate pressure support when using prosthesis - C/w novolog 11 units TID qc  - Increase glargline 17 units to 22 units qhs (as prescribed on prior visit) - Advised on glucose monitroing - Referral to clinical pharmacy for assistance with adherence.    Patient discussed with Dr. Daryll Drown  -Gilberto Better, [facility] Pager: [phone]

## 2020-01-03 NOTE — Assessment & Plan Note (Signed)
Lab Results  Component Value Date   HGBA1C 12.1 (A) 11/23/2019   [name] presents with complaint of neuropathy symptoms. Mentions taking his insulin as prescribed. Although on teachback, noted to be taking glargine at 17 units instead of prescribed 22 units Denies hypoglycemic symptoms. Had prolonged discussion regarding sequelae of T2Dm being neuropathy and importance of managing his diabetes well to prevent worsening neuropathy symptoms. [name] expressed understanding.  A/P Did not bring glucometer. Random blood sugar in clinic at 260. Need better adherence. Has history of non-adherence. Feet checked this visit with multiple pressure wounds on stump that is well-healing. Advised to use appropriate pressure support when using prosthesis - C/w novolog 11 units TID qc  - Increase glargline 17 units to 22 units qhs (as prescribed on prior visit) - Advised on glucose monitroing - Referral to clinical pharmacy for assistance with adherence.

## 2020-01-03 NOTE — Assessment & Plan Note (Signed)
Pt requires refills on medications with associated diagnosis above.  Reviewed disease process and find this medication to be necessary, will not change dose or alter current therapy. 

## 2020-01-03 NOTE — Assessment & Plan Note (Signed)
[name] is a 59 yo M w/ PMH of uncontrolled T2DM, CKD4-5, HTN, PVD s/p R BKA, HLD and tobacco abuse presenting to [facility] w/ complaint of right upper extremity tingling. He mentions feeling numb and tingling at the periphery of his finger tips. He mentions these sensation have been ongoing for many months and he is requesting medication for pain. Discussed prior evaluation by Dr.Katsadouros and change in his symptoms when his gabapentin was changed to Lyrica. He mentions that he does not have reliable transportation and did not pick up his medications. Also mentions that he did not know that his meds were available because the pharmacy did not call him.  A/P Continuing to endorse neuropathy in setting of medication non-adherence. Complicated by socio-economic barriers. Refills for chronic meds including Lyrica sent to pharmacy. Also will make referral for assistance with medication adherence with clinical pharmacist  - Referral for clinical pharmacy - Refills for chronic meds sent

## 2020-01-03 NOTE — Assessment & Plan Note (Signed)
Was seen by Dr.Katsadouros for same complaint. Continuing to endorse chronic cough. Mentions not taking his Nexium since his last visit here. Continuing to smoke.   A/P Chronic pain work-up complicated by medication non-adherence and socio-economic barriers. - Nexium refill sent - Advised to pick up his medications and take as prescribed

## 2020-01-03 NOTE — Assessment & Plan Note (Signed)
BP Readings from Last 3 Encounters:  01/02/20 (!) 156/98  11/23/19 137/68  11/09/19 (!) 114/92   Previously better controlled with amlodipine, carvedilol, lisinopirl-hctz. On last visit advised to change lisinopril-hctz to hctz 25mg  daily. [name] mentions he has not taken any of his bp meds for the last 4 weeks as he did not know that the new medication was available to him at the pharmacy.  - Counseled on medication adherence - C/w amlodipine 10mg , carveidlol 6.25mg  , hctz 25mg 

## 2020-01-08 MED FILL — ESOMEPRAZOLE MAG DR 40 MG C: 40 | 90 days supply | Qty: 90 | Fill #0

## 2020-01-08 MED FILL — CARVEDILOL 6.25 MG TABLET: 6.25 | 90 days supply | Qty: 180 | Fill #0

## 2020-01-08 NOTE — Progress Notes (Signed)
Internal Medicine Clinic Attending  Case discussed with Dr. Lee  At the time of the visit.  We reviewed the resident's history and exam and pertinent patient test results.  I agree with the assessment, diagnosis, and plan of care documented in the resident's note.    

## 2020-01-29 ENCOUNTER — Encounter: Payer: Medicare HMO | Admitting: Pharmacist

## 2020-02-11 ENCOUNTER — Encounter: Payer: Self-pay | Admitting: Pharmacist

## 2020-02-11 NOTE — Addendum Note (Signed)
Addended by: Hulan Fray on: 02/11/2020 02:37 PM   Modules accepted: Orders

## 2020-02-22 ENCOUNTER — Encounter: Payer: Self-pay | Admitting: Gastroenterology

## 2020-03-11 ENCOUNTER — Telehealth: Payer: Self-pay | Admitting: Gastroenterology

## 2020-03-11 NOTE — Telephone Encounter (Signed)
Spoke with patient's daughter, she states that she spoke with the patient and he does not want to proceed with the repeat colonoscopy at this time.

## 2020-03-11 NOTE — Telephone Encounter (Signed)
I still recommend it because the prep quality was poor on the Dec 2020 colonoscopy, but I respect his choice.  - HD

## 2020-03-11 NOTE — Telephone Encounter (Signed)
Patient daughter called and would like to know if it is necessary for patient to have another colonoscopy because they received the recall letter in the mail.

## 2020-04-10 ENCOUNTER — Telehealth: Payer: Self-pay

## 2020-04-10 ENCOUNTER — Other Ambulatory Visit: Payer: Self-pay

## 2020-04-10 ENCOUNTER — Ambulatory Visit: Payer: Medicare HMO | Admitting: Student

## 2020-04-10 NOTE — Telephone Encounter (Signed)
TC to patient for scheduled AWV with RN.  Pt states he is leaving to go to the grocery store with his daughter and needs to reschedule for another time.  Will forward to office manager.  RN did inform patient that Pharmacologist, Dr. Georgina Peer has been trying to reach him and to please call [facility] back and schedule an in person appt with Dr. Georgina Peer.  He states he will do this. SChaplin, RN,BSN

## 2020-04-14 ENCOUNTER — Other Ambulatory Visit (HOSPITAL_COMMUNITY): Payer: Self-pay

## 2020-04-14 ENCOUNTER — Other Ambulatory Visit: Payer: Self-pay

## 2020-04-14 ENCOUNTER — Encounter: Payer: Self-pay | Admitting: Internal Medicine

## 2020-04-14 ENCOUNTER — Ambulatory Visit (INDEPENDENT_AMBULATORY_CARE_PROVIDER_SITE_OTHER): Payer: Medicare HMO | Admitting: Internal Medicine

## 2020-04-14 VITALS — BP 146/78 | HR 87 | Temp 98.0°F | Ht 72.0 in | Wt 173.1 lb

## 2020-04-14 DIAGNOSIS — E1142 Type 2 diabetes mellitus with diabetic polyneuropathy: Secondary | ICD-10-CM | POA: Diagnosis not present

## 2020-04-14 DIAGNOSIS — N183 Chronic kidney disease, stage 3 unspecified: Secondary | ICD-10-CM | POA: Diagnosis not present

## 2020-04-14 DIAGNOSIS — E1122 Type 2 diabetes mellitus with diabetic chronic kidney disease: Secondary | ICD-10-CM

## 2020-04-14 DIAGNOSIS — I10 Essential (primary) hypertension: Secondary | ICD-10-CM | POA: Diagnosis not present

## 2020-04-14 DIAGNOSIS — E118 Type 2 diabetes mellitus with unspecified complications: Secondary | ICD-10-CM | POA: Diagnosis not present

## 2020-04-14 DIAGNOSIS — E782 Mixed hyperlipidemia: Secondary | ICD-10-CM | POA: Diagnosis not present

## 2020-04-14 DIAGNOSIS — K219 Gastro-esophageal reflux disease without esophagitis: Secondary | ICD-10-CM | POA: Diagnosis not present

## 2020-04-14 DIAGNOSIS — E1165 Type 2 diabetes mellitus with hyperglycemia: Secondary | ICD-10-CM

## 2020-04-14 DIAGNOSIS — IMO0002 Reserved for concepts with insufficient information to code with codable children: Secondary | ICD-10-CM

## 2020-04-14 LAB — POCT GLYCOSYLATED HEMOGLOBIN (HGB A1C): Hemoglobin A1C: 8.9 % — AB (ref 4.0–5.6)

## 2020-04-14 LAB — GLUCOSE, CAPILLARY: Glucose-Capillary: 220 mg/dL — ABNORMAL HIGH (ref 70–99)

## 2020-04-14 MED ORDER — DULOXETINE HCL 30 MG PO CPEP
30.0000 mg | ORAL_CAPSULE | Freq: Every day | ORAL | 1 refills | Status: DC
  Filled 2020-04-14: qty 90, 90d supply, fill #0

## 2020-04-14 MED ORDER — PREGABALIN 50 MG PO CAPS
50.0000 mg | ORAL_CAPSULE | Freq: Two times a day (BID) | ORAL | 2 refills | Status: DC
  Filled 2020-04-14: qty 60, 30d supply, fill #0

## 2020-04-14 MED ORDER — BASAGLAR KWIKPEN 100 UNIT/ML ~~LOC~~ SOPN
22.0000 [IU] | PEN_INJECTOR | Freq: Every evening | SUBCUTANEOUS | 11 refills | Status: DC
  Filled 2020-04-14: qty 15, 68d supply, fill #0

## 2020-04-14 MED ORDER — AMLODIPINE BESYLATE 10 MG PO TABS
10.0000 mg | ORAL_TABLET | Freq: Every day | ORAL | 3 refills | Status: DC
  Filled 2020-04-14: qty 90, 90d supply, fill #0

## 2020-04-14 MED ORDER — ESOMEPRAZOLE MAGNESIUM 40 MG PO CPDR
40.0000 mg | DELAYED_RELEASE_CAPSULE | Freq: Every day | ORAL | 3 refills | Status: DC
  Filled 2020-04-14: qty 90, 90d supply, fill #0

## 2020-04-14 MED ORDER — LISINOPRIL 10 MG PO TABS
10.0000 mg | ORAL_TABLET | Freq: Every day | ORAL | 3 refills | Status: DC
  Filled 2020-04-14: qty 90, 90d supply, fill #0

## 2020-04-14 MED ORDER — ROSUVASTATIN CALCIUM 20 MG PO TABS
20.0000 mg | ORAL_TABLET | Freq: Every day | ORAL | 3 refills | Status: DC
  Filled 2020-04-14: qty 90, 90d supply, fill #0

## 2020-04-14 MED ORDER — INSULIN ASPART 100 UNIT/ML FLEXPEN
11.0000 [IU] | PEN_INJECTOR | Freq: Three times a day (TID) | SUBCUTANEOUS | 2 refills | Status: DC
  Filled 2020-04-14: qty 27, 82d supply, fill #0

## 2020-04-14 MED ORDER — HYDROCHLOROTHIAZIDE 12.5 MG PO TABS
25.0000 mg | ORAL_TABLET | Freq: Every day | ORAL | 6 refills | Status: DC
  Filled 2020-04-14: qty 90, 45d supply, fill #0

## 2020-04-14 MED ORDER — CARVEDILOL 6.25 MG PO TABS
6.2500 mg | ORAL_TABLET | Freq: Two times a day (BID) | ORAL | 3 refills | Status: DC
  Filled 2020-04-14: qty 180, 90d supply, fill #0

## 2020-04-14 NOTE — Assessment & Plan Note (Addendum)
Lab Results  Component Value Date   HGBA1C 8.9 (A) 04/14/2020   [name] is a 60 yo M w/ PMH of uncontrolled diabetes with neuropathy, CKD3, htn, presenting for management of his chronic conditions. He mentions having taken his insulin as prescribed but on teach back, mentions he is taking his novolog 11 units BID and not taking any other insulin. He is not taking any of his oral medications.   A/p Present for management of his diabetes. Improving glycemic control despite in-correct administration of prescribed doses. Treatment complicated by poor health literacy. Also appear to have worsening vision which may be contributing.  - Re-educated on prescribed dosing of novolog 11 units TID qc and glargine 22 units qhs - Referral to ophtho for retinopathy screen - Foot exam shows high risk foot, need close monitoring - Could consider adding SGLT-2 inhibitor if renal fx is permissable

## 2020-04-14 NOTE — Assessment & Plan Note (Addendum)
Has known CKD 3b/4 due to uncontrolled diabetes. Baseline creatine 2.2-2.4. Denies significant edema, respiratory distress. Previously on olmesartan but discontinued due to cough. Lisinopril ordered but never picked up.  Lab Results  Component Value Date   CREATININE 2.40 (H) 08/01/2019   - BMP - Start lisinopril 10mg  daily  Addendum: BMP showing elevated creatinine from 2.4->3.0 Could be due to AKI or progression of CKD. Called and spoke with pt to rtc for repeat blood work and if worsening CKD, would refer to nephrology.

## 2020-04-14 NOTE — Assessment & Plan Note (Signed)
BP Readings from Last 3 Encounters:  04/14/20 (!) 146/78  01/02/20 (!) 156/98  11/23/19 137/68   Above goal. Complicated by medication non-adherence.   - Advised on importance of medication adherence - Scripts re-sent to pharmacy

## 2020-04-14 NOTE — Progress Notes (Signed)
CC: Finger pain  HPI: [name] is a 60 y.o. with PMH listed below presenting with complaint of finger pain. Please see problem based assessment and plan for further details.  Past Medical History:  Diagnosis Date  . CHF (congestive heart failure) ([place])   . Critical lower limb ischemia, lt with ABI of 0.60 12/31/2011  . Gangrene ([place])    right foot  . GERD (gastroesophageal reflux disease)   . Hyperlipidemia   . Hypertension   . Neuromuscular disorder ([place])    diabetic neruopathy - hands  . Osteomyelitis ([place]) 2010   left foot, s/p midfoot amputation  . 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 PAD (peripheral artery disease) ([place])    ABIs 11/30/11: L ABI 0.68, R ABI 0.84  . Pneumonia 2010  . PVD (peripheral vascular disease) ([place]) 12/31/2011  . S/P angioplasty with stent, 12/30/11, of Lt SFA, Post. tibialis and PTA of L. ant and post. tibial vessels 12/31/2011  . SOB (shortness of breath)    uses inhaler prn  . Type II diabetes mellitus ([place]) ~ 2002   Review of Systems: Review of Systems  Constitutional: Positive for malaise/fatigue. Negative for chills and fever.  Eyes: Positive for blurred vision.  Respiratory: Negative for shortness of breath.   Cardiovascular: Negative for chest pain, palpitations and leg swelling.  Gastrointestinal: Negative for constipation, diarrhea, nausea and vomiting.  Musculoskeletal: Negative for falls.  Neurological: Positive for dizziness, tingling, sensory change, focal weakness and headaches.  All other systems reviewed and are negative.   Physical Exam: Vitals:   04/14/20 1323 04/14/20 1403  BP: (!) 183/101 (!) 146/78  Pulse: 87   Temp: 98 F (36.7 C)   TempSrc: Oral   SpO2: 100%   Weight: 173 lb 1.6 oz (78.5 kg)   Height: 6' (1.829 m)    Gen: Well-developed, chronically ill-appearing, NAD HEENT: NCAT head, hearing intact CV: RRR, S1, S2  normal Pulm: CTAB, No rales, no wheezes Extm: Stable RLE bka, LLE s/p metatarsal amputation Skin: Dry, Warm, very dry skin on LLE without obvious ulcers or wounds  Assessment & Plan:   Diabetic peripheral neuropathy Continues to endorse upper extremity tingling with burning sensation. Previously prescribed pregabalin but has never picked up the medication due to transportation issues. States his family was suppose to pick up his medications but he only got insulin.  A/P Continuing to endorse neuropathy in setting of uncontrolled diabetes. Explained importance of improving glycemic control to prevent worsening neuropathy. Medication adherence complicated by poor social status including lack of transportation. Previously referred to pharmacy team / care management for assistance but patient Lost to follow up. Advised to [name] that if he does not take the meds offered to him, we are unable to offer alternative interventions. [name] expressed understanding  - Refills for duloxetine, pregabalin sent  Diabetes mellitus type 2, uncontrolled, with complications Lab Results  Component Value Date   HGBA1C 8.9 (A) 04/14/2020   [name] is a 60 yo M w/ PMH of uncontrolled diabetes with neuropathy, CKD3, htn, presenting for management of his chronic conditions. He mentions having taken his insulin as prescribed but on teach back, mentions he is taking his novolog 11 units BID and not taking any other insulin. He is not taking any of his oral medications.   A/p Present for management of his diabetes. Improving glycemic control despite in-correct administration of prescribed  doses. Treatment complicated by poor health literacy. Also appear to have worsening vision which may be contributing.  - Re-educated on prescribed dosing of novolog 11 units TID qc and glargine 22 units qhs - Referral to ophtho for retinopathy screen - Foot exam shows high risk foot, need close monitoring - Could consider  adding SGLT-2 inhibitor if renal fx is permissable  Essential hypertension BP Readings from Last 3 Encounters:  04/14/20 (!) 146/78  01/02/20 (!) 156/98  11/23/19 137/68   Above goal. Complicated by medication non-adherence.   - Advised on importance of medication adherence - Scripts re-sent to pharmacy  CKD stage 3 due to type 2 diabetes mellitus ([place]) Has known CKD 3b/4 due to uncontrolled diabetes. Baseline creatine 2.2-2.4. Denies significant edema, respiratory distress. Previously on olmesartan but discontinued due to cough. Lisinopril ordered but never picked up.  Lab Results  Component Value Date   CREATININE 2.40 (H) 08/01/2019   - BMP - Start lisinopril 10mg  daily   Patient discussed with Dr. Dareen Piano  -Gilberto Better, [facility] Pager: [phone]

## 2020-04-14 NOTE — Patient Instructions (Addendum)
Dear Unk Pinto,  Thank you for allowing [place] to provide your care today. Today we discussed your diabetes and finger pains    I have ordered cbc, bmp labs for you. I will call if any are abnormal.    Today we made the following changes to your medications:    Please take your insulin as prescribed.  Novolog 11 units 3 times daily with meals Glargine 22 units at bedtime  Please follow-up in 3 months.    Please call the internal medicine center clinic if you have any questions or concerns, we may be able to help and keep you from a long and expensive emergency room wait. Our clinic and after hours phone number is [phone], the best time to call is Monday through Friday 9 am to 4 pm but there is always someone available 24/7 if you have an emergency. If you need medication refills please notify your pharmacy one week in advance and they will send [place] a request.    If you have not gotten the COVID vaccine, I recommend doing so:  You may get it at your local [facility] or [facility] OR To schedule an appointment for a COVID vaccine or be added to the vaccine wait list: Go to [email or web address]   OR Go to [email or web address]                  OR Call [phone]                                     OR Call [phone] and select Option 2  Thank you for choosing [facility]

## 2020-04-14 NOTE — Assessment & Plan Note (Signed)
Continues to endorse upper extremity tingling with burning sensation. Previously prescribed pregabalin but has never picked up the medication due to transportation issues. States his family was suppose to pick up his medications but he only got insulin.  A/P Continuing to endorse neuropathy in setting of uncontrolled diabetes. Explained importance of improving glycemic control to prevent worsening neuropathy. Medication adherence complicated by poor social status including lack of transportation. Previously referred to pharmacy team / care management for assistance but patient Lost to follow up. Advised to [name] that if he does not take the meds offered to him, we are unable to offer alternative interventions. [name] expressed understanding  - Refills for duloxetine, pregabalin sent

## 2020-04-15 ENCOUNTER — Other Ambulatory Visit (HOSPITAL_COMMUNITY): Payer: Self-pay

## 2020-04-15 LAB — BMP8+ANION GAP
Anion Gap: 15 mmol/L (ref 10.0–18.0)
BUN/Creatinine Ratio: 6 — ABNORMAL LOW (ref 10–24)
BUN: 17 mg/dL (ref 8–27)
CO2: 21 mmol/L (ref 20–29)
Calcium: 8.7 mg/dL (ref 8.6–10.2)
Chloride: 96 mmol/L (ref 96–106)
Creatinine, Ser: 3.08 mg/dL — ABNORMAL HIGH (ref 0.76–1.27)
Glucose: 214 mg/dL — ABNORMAL HIGH (ref 65–99)
Potassium: 4.7 mmol/L (ref 3.5–5.2)
Sodium: 132 mmol/L — ABNORMAL LOW (ref 134–144)
eGFR: 22 mL/min/{1.73_m2} — ABNORMAL LOW (ref >59)

## 2020-04-16 ENCOUNTER — Other Ambulatory Visit: Payer: Self-pay | Admitting: Internal Medicine

## 2020-04-16 DIAGNOSIS — N183 Chronic kidney disease, stage 3 unspecified: Secondary | ICD-10-CM

## 2020-04-16 DIAGNOSIS — E1122 Type 2 diabetes mellitus with diabetic chronic kidney disease: Secondary | ICD-10-CM

## 2020-04-16 NOTE — Progress Notes (Signed)
Internal Medicine Clinic Attending  Case discussed with Dr. Lee  At the time of the visit.  We reviewed the resident's history and exam and pertinent patient test results.  I agree with the assessment, diagnosis, and plan of care documented in the resident's note.    

## 2020-05-18 ENCOUNTER — Encounter (HOSPITAL_COMMUNITY): Payer: Self-pay | Admitting: Emergency Medicine

## 2020-05-18 ENCOUNTER — Other Ambulatory Visit: Payer: Self-pay

## 2020-05-18 ENCOUNTER — Emergency Department (HOSPITAL_COMMUNITY): Payer: Medicare HMO

## 2020-05-18 ENCOUNTER — Emergency Department (HOSPITAL_COMMUNITY)
Admission: EM | Admit: 2020-05-18 | Discharge: 2020-05-18 | Disposition: A | Discharge status: Home / Self Care | Payer: Medicare HMO | Attending: Emergency Medicine | Admitting: Emergency Medicine

## 2020-05-18 DIAGNOSIS — I5030 Unspecified diastolic (congestive) heart failure: Secondary | ICD-10-CM | POA: Insufficient documentation

## 2020-05-18 DIAGNOSIS — Z79899 Other long term (current) drug therapy: Secondary | ICD-10-CM | POA: Insufficient documentation

## 2020-05-18 DIAGNOSIS — R519 Headache, unspecified: Secondary | ICD-10-CM | POA: Diagnosis not present

## 2020-05-18 DIAGNOSIS — N183 Chronic kidney disease, stage 3 unspecified: Secondary | ICD-10-CM | POA: Diagnosis not present

## 2020-05-18 DIAGNOSIS — E1122 Type 2 diabetes mellitus with diabetic chronic kidney disease: Secondary | ICD-10-CM | POA: Insufficient documentation

## 2020-05-18 DIAGNOSIS — F17210 Nicotine dependence, cigarettes, uncomplicated: Secondary | ICD-10-CM | POA: Insufficient documentation

## 2020-05-18 DIAGNOSIS — I130 Hypertensive heart and chronic kidney disease with heart failure and stage 1 through stage 4 chronic kidney disease, or unspecified chronic kidney disease: Secondary | ICD-10-CM | POA: Diagnosis not present

## 2020-05-18 DIAGNOSIS — M542 Cervicalgia: Secondary | ICD-10-CM | POA: Insufficient documentation

## 2020-05-18 DIAGNOSIS — Z794 Long term (current) use of insulin: Secondary | ICD-10-CM | POA: Insufficient documentation

## 2020-05-18 LAB — BASIC METABOLIC PANEL
Anion gap: 8 (ref 5–15)
BUN: 32 mg/dL — ABNORMAL HIGH (ref 6–20)
CO2: 23 mmol/L (ref 22–32)
Calcium: 9.2 mg/dL (ref 8.9–10.3)
Chloride: 100 mmol/L (ref 98–111)
Creatinine, Ser: 3.81 mg/dL — ABNORMAL HIGH (ref 0.61–1.24)
GFR, Estimated: 17 mL/min — ABNORMAL LOW (ref >60)
Glucose, Bld: 155 mg/dL — ABNORMAL HIGH (ref 70–99)
Potassium: 4.5 mmol/L (ref 3.5–5.1)
Sodium: 131 mmol/L — ABNORMAL LOW (ref 135–145)

## 2020-05-18 LAB — CBC WITH DIFFERENTIAL/PLATELET
Abs Immature Granulocytes: 0.01 10*3/uL (ref 0.00–0.07)
Basophils Absolute: 0 10*3/uL (ref 0.0–0.1)
Basophils Relative: 0 %
Eosinophils Absolute: 0 10*3/uL (ref 0.0–0.5)
Eosinophils Relative: 0 %
HCT: 38.9 % — ABNORMAL LOW (ref 39.0–52.0)
Hemoglobin: 13.4 g/dL (ref 13.0–17.0)
Immature Granulocytes: 0 %
Lymphocytes Relative: 15 %
Lymphs Abs: 1 10*3/uL (ref 0.7–4.0)
MCH: 29.9 pg (ref 26.0–34.0)
MCHC: 34.4 g/dL (ref 30.0–36.0)
MCV: 86.8 fL (ref 80.0–100.0)
Monocytes Absolute: 0.3 10*3/uL (ref 0.1–1.0)
Monocytes Relative: 4 %
Neutro Abs: 5.6 10*3/uL (ref 1.7–7.7)
Neutrophils Relative %: 81 %
Platelets: 211 10*3/uL (ref 150–400)
RBC: 4.48 MIL/uL (ref 4.22–5.81)
RDW: 12.1 % (ref 11.5–15.5)
WBC: 6.9 10*3/uL (ref 4.0–10.5)
nRBC: 0 % (ref 0.0–0.2)

## 2020-05-18 MED ORDER — ACETAMINOPHEN 500 MG PO TABS
1000.0000 mg | ORAL_TABLET | Freq: Once | ORAL | Status: AC
  Administered 2020-05-18: 1000 mg via ORAL
  Filled 2020-05-18: qty 2

## 2020-05-18 MED ORDER — CLONIDINE HCL 0.1 MG PO TABS
0.1000 mg | ORAL_TABLET | Freq: Once | ORAL | Status: AC
  Administered 2020-05-18: 0.1 mg via ORAL
  Filled 2020-05-18: qty 1

## 2020-05-18 MED ORDER — DIPHENHYDRAMINE HCL 50 MG/ML IJ SOLN
12.5000 mg | Freq: Once | INTRAMUSCULAR | Status: AC
  Administered 2020-05-18: 12.5 mg via INTRAMUSCULAR
  Filled 2020-05-18: qty 1

## 2020-05-18 NOTE — ED Notes (Signed)
EDP notified of HTN
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
## 2020-05-18 NOTE — Discharge Instructions (Signed)
Return for any problem.  ?

## 2020-05-18 NOTE — ED Triage Notes (Signed)
Patient reports headache and neck pain x3 days. Denies fever.

## 2020-05-29 ENCOUNTER — Telehealth: Payer: Self-pay

## 2020-05-29 NOTE — Telephone Encounter (Signed)
Requesting an order for sleeve that goes over prosthetic leg. Please call back.

## 2020-06-03 NOTE — Telephone Encounter (Signed)
Returned call to patient. States he needs supplies for his prosthetic leg. States something is rubbing against the stump. Denies skin breakdown. First available appt given for 06/05/20 at 1045 in person. Patient uses [employer].

## 2020-06-05 ENCOUNTER — Encounter: Payer: Medicare HMO | Admitting: Internal Medicine

## 2020-06-11 ENCOUNTER — Encounter: Payer: Self-pay | Admitting: Student

## 2020-06-11 ENCOUNTER — Encounter: Payer: Medicare HMO | Admitting: Internal Medicine

## 2020-06-11 NOTE — Addendum Note (Signed)
Addended by: Hulan Fray on: 06/11/2020 05:34 PM   Modules accepted: Orders

## 2020-06-16 ENCOUNTER — Ambulatory Visit (INDEPENDENT_AMBULATORY_CARE_PROVIDER_SITE_OTHER): Payer: Medicare Other | Admitting: Internal Medicine

## 2020-06-16 ENCOUNTER — Other Ambulatory Visit (HOSPITAL_COMMUNITY): Payer: Self-pay

## 2020-06-16 VITALS — BP 179/95 | HR 84 | Temp 97.7°F | Ht 72.0 in | Wt 166.2 lb

## 2020-06-16 DIAGNOSIS — Z89511 Acquired absence of right leg below knee: Secondary | ICD-10-CM

## 2020-06-16 DIAGNOSIS — R918 Other nonspecific abnormal finding of lung field: Secondary | ICD-10-CM

## 2020-06-16 DIAGNOSIS — R053 Chronic cough: Secondary | ICD-10-CM

## 2020-06-16 DIAGNOSIS — E1142 Type 2 diabetes mellitus with diabetic polyneuropathy: Secondary | ICD-10-CM

## 2020-06-16 MED ORDER — DULOXETINE HCL 30 MG PO CPEP
60.0000 mg | ORAL_CAPSULE | Freq: Every day | ORAL | 0 refills | Status: DC
  Filled 2020-06-16: qty 60, 30d supply, fill #0

## 2020-06-16 MED ORDER — EUCERIN EX CREA
TOPICAL_CREAM | CUTANEOUS | 0 refills | Status: DC | PRN
  Filled 2020-06-16: qty 454, 30d supply, fill #0

## 2020-06-16 NOTE — Patient Instructions (Addendum)
To Mr. Recore,   It was a pleasure meeting you today. Today we discussed your prosthetic irritation and cough.   For your prosthetic, we will place an order for you to be refitted. The company will call you to schedule an appointment. In the meantime, I have placed an order for Eucerin cream, please apply to your stump daily and cover with a sock in order to protect the skin.   For your cough, please continue your Nexium. Given that this has been a chronic condition, we will schedule you for an image of your chest to rule out any possible lung involvement.   We will have you follow up with your primary care provider in one month.  Maudie Mercury, MD

## 2020-06-16 NOTE — Progress Notes (Signed)
   CC: Stump Check  HPI:  Mr.Atanacio Rio is a 60 y.o. M/F, with a PMH noted below, who presents to the clinic for a stump check of his R BKA. To see the management of their acute and chronic conditions, please see the A&P note under the Encounters tab.   Past Medical History:  Diagnosis Date   CHF (congestive heart failure) (H[facility])    Critical lower limb ischemia, lt with ABI of 0.60 12/31/2011   Gangrene ([place])    right foot   GERD (gastroesophageal reflux disease)    Hyperlipidemia    Hypertension    Neuromuscular disorder ([place])    diabetic neruopathy - hands   Osteomyelitis ([place]) 2010   left foot, s/p midfoot amputation   Osteomyelitis ([place]) 09/2013   RT BKA   Osteomyelitis of ankle or foot 05/2011   rt foot, s/p 5th ray amputation   PAD (peripheral artery disease) ([place])    ABIs 11/30/11: L ABI 0.68, R ABI 0.84   Pneumonia 2010   PVD (peripheral vascular disease) ([place]) 12/31/2011   S/P angioplasty with stent, 12/30/11, of Lt SFA, Post. tibialis and PTA of L. ant and post. tibial vessels 12/31/2011   SOB (shortness of breath)    uses inhaler prn   Type II diabetes mellitus ([place]) ~ 2002   Review of Systems:   Review of Systems  Constitutional:  Negative for chills, malaise/fatigue and weight loss.  Respiratory:  Positive for cough. Negative for hemoptysis, sputum production, shortness of breath and wheezing.   Gastrointestinal:  Negative for abdominal pain, constipation, diarrhea, nausea and vomiting.  Musculoskeletal:        Pain at the R stump  Neurological:  Negative for tingling and headaches.    Physical Exam:  Vitals:   06/16/20 1548  BP: (!) 179/95  Pulse: 84  Temp: 97.7 F (36.5 C)  TempSrc: Oral  SpO2: 100%  Weight: 166 lb 3.2 oz (75.4 kg)  Height: 6' (1.829 m)   Physical Exam Constitutional:      Appearance: He is ill-appearing.     Comments: Chronically ill appearing, answers questions appropriately.   Cardiovascular:     Rate and Rhythm: Normal rate  and regular rhythm.     Heart sounds: Normal heart sounds. No murmur heard.   No friction rub. No gallop.  Pulmonary:     Effort: Pulmonary effort is normal.     Breath sounds: Normal breath sounds. No wheezing, rhonchi or rales.  Musculoskeletal:     Comments: Site of R BKA shows dry skin, no breakdown noted, mild tenderness to palpation at site of amputation, but no erythema or purulence noted. Sponges wrapped in tissue keeping sleeve in place.          Assessment & Plan:   See Encounters Tab for problem based charting.  Patient discussed with Dr. Jimmye Norman

## 2020-06-17 ENCOUNTER — Telehealth: Payer: Self-pay

## 2020-06-17 NOTE — Assessment & Plan Note (Signed)
Continued complaints of tingling type pain consistent with diabetic peripheral neuropathy. Will increase Duloxetine today, follow up with PCP visit in one month's time.  - Continue Lyrica - Increase Duloxetine to 60 mg daily

## 2020-06-17 NOTE — Assessment & Plan Note (Signed)
Patient here for acute visit for a stump check continues to endorse chronic cough. This is a chronic problem that has been contributed to his GERD. Cough is worse at night and when lying down flat. Cough is non productive. He states that he is continuing to take his Nexium. He has not had a screening CT chest in 2 years. Has been worked up for lung cancer in the past secondary to mediastinal lymphadenopathy.  will order screening CT Chest today.  - Screening CT chest - Continue Nexium - Continue to monitor

## 2020-06-17 NOTE — Progress Notes (Signed)
Patient was discussed with Dr. Gilford Rile and photos of residual limb reviewed.  I agree with Dr. Gilford Rile' assessment and plan.

## 2020-06-17 NOTE — Assessment & Plan Note (Signed)
Patient s/p BKA of the RLE in 2015. He has lost weight over the past several years and has complaints of irritation from his sleeve that is causing him pain. He is currently using sponges wrapped in tissue to fill in his sleeve to keep sleeve from falling off. On physical exam there is some mild tenderness and dry skin, but no open sores or wounds.  - Referral for new prosthetic/sleeve/supplies.

## 2020-06-17 NOTE — Telephone Encounter (Signed)
I Left a voice mail message for this patient at [phone] and also [phone].I have faxed demographics,office note and DME for patients Prosthetics supplies to [employer].The patient has used them before.The phone number is [phone]. They will get in touch with patient as to when he can come for an appointment [place], G[place]a [place] C6/14/202212:47 PM

## 2020-06-24 ENCOUNTER — Other Ambulatory Visit (HOSPITAL_COMMUNITY): Payer: Self-pay

## 2020-07-08 ENCOUNTER — Encounter: Payer: Self-pay | Admitting: *Deleted

## 2020-07-11 ENCOUNTER — Ambulatory Visit (HOSPITAL_COMMUNITY): Payer: Medicare Other

## 2020-07-25 ENCOUNTER — Ambulatory Visit (HOSPITAL_COMMUNITY): Payer: Medicare Other

## 2020-09-03 NOTE — Addendum Note (Signed)
Addended by: [name] on: 09/03/2020 03:22 PM   Modules accepted: Orders

## 2020-09-17 ENCOUNTER — Other Ambulatory Visit (HOSPITAL_COMMUNITY): Payer: Self-pay

## 2020-09-17 ENCOUNTER — Encounter: Payer: Self-pay | Admitting: Internal Medicine

## 2020-09-17 ENCOUNTER — Ambulatory Visit (INDEPENDENT_AMBULATORY_CARE_PROVIDER_SITE_OTHER): Payer: Medicare Other | Admitting: Internal Medicine

## 2020-09-17 VITALS — BP 178/96 | HR 96 | Temp 98.6°F | Ht 72.0 in | Wt 188.0 lb

## 2020-09-17 DIAGNOSIS — E1165 Type 2 diabetes mellitus with hyperglycemia: Secondary | ICD-10-CM | POA: Diagnosis not present

## 2020-09-17 DIAGNOSIS — Z23 Encounter for immunization: Secondary | ICD-10-CM | POA: Diagnosis not present

## 2020-09-17 DIAGNOSIS — E118 Type 2 diabetes mellitus with unspecified complications: Secondary | ICD-10-CM

## 2020-09-17 DIAGNOSIS — I10 Essential (primary) hypertension: Secondary | ICD-10-CM | POA: Diagnosis not present

## 2020-09-17 DIAGNOSIS — Z89511 Acquired absence of right leg below knee: Secondary | ICD-10-CM

## 2020-09-17 DIAGNOSIS — I739 Peripheral vascular disease, unspecified: Secondary | ICD-10-CM

## 2020-09-17 DIAGNOSIS — R21 Rash and other nonspecific skin eruption: Secondary | ICD-10-CM

## 2020-09-17 DIAGNOSIS — E1122 Type 2 diabetes mellitus with diabetic chronic kidney disease: Secondary | ICD-10-CM | POA: Diagnosis not present

## 2020-09-17 DIAGNOSIS — E1142 Type 2 diabetes mellitus with diabetic polyneuropathy: Secondary | ICD-10-CM

## 2020-09-17 DIAGNOSIS — IMO0002 Reserved for concepts with insufficient information to code with codable children: Secondary | ICD-10-CM

## 2020-09-17 DIAGNOSIS — N183 Chronic kidney disease, stage 3 unspecified: Secondary | ICD-10-CM

## 2020-09-17 LAB — POCT GLYCOSYLATED HEMOGLOBIN (HGB A1C): Hemoglobin A1C: 7.9 % — AB (ref 4.0–5.6)

## 2020-09-17 LAB — GLUCOSE, CAPILLARY: Glucose-Capillary: 90 mg/dL (ref 70–99)

## 2020-09-17 MED ORDER — BENADRYL ITCH RELIEF 2-0.1 % EX STCK
1.0000 | CUTANEOUS | 0 refills | Status: DC | PRN
  Filled 2020-09-17: qty 1, fill #0

## 2020-09-17 NOTE — Progress Notes (Signed)
CC: Skin irritation  HPI:[name] is a 60 y.o. male who presents for evaluation of skin irritation.. Please see individual problem based A/P for details.  Please see encounters tab for problem-based charting.  Problem List Items Addressed This Visit       Cardiovascular and Mediastinum   PVD (peripheral vascular disease) ([place]) (Chronic)    Patient endorses recent change in sensation of the left lower extremity.  Reports that for the past 2 weeks he has had aching pain in the leg.  No having numbness to gross touch.  Reporting trouble moving over the past year.  Denies swelling.  Patient has history of peripheral vascular disease and is status post BKA to the right leg.  Referral to vascular surgery placed.      Relevant Orders   Ambulatory referral to Vascular Surgery   Essential hypertension    Blood pressure elevated 160s and 170s on recheck.  He reports that he has not been taking his medications and has not filled them in a while.  He denies any headache vision changes chest pain shortness of breath today.  Discussed with him that it is important to control his blood pressure to prevent future complications such as stroke heart attack renal disease and progression of his peripheral vascular disease. Advised patient to restart taking his blood pressure medications.      Relevant Orders   BMP8+Anion Gap (Completed)     Endocrine   Diabetes mellitus type 2, uncontrolled, with complications ([place])    Patient reports compliance with insulin.  A1c improved from last visit down to 7.9 today.  We will continue with current medications.      Relevant Orders   POC Hbg A1C (Completed)   Diabetic peripheral neuropathy ([place])   CKD stage 3 due to type 2 diabetes mellitus ([place])    Kidney disease recently progressed.  Creatinine 3.2 up from 2.41-year ago.  GFR down to 21. likely due to uncontrolled hypertension.   Discussed with the patient the relationship between  uncontrolled blood pressure and progression of kidney disease.  I will also discussed long-term complications of renal failure including dialysis.  Advised patient to restart his blood pressure medications.  Referral to nephrology placed.      Relevant Orders   Microalbumin / Creatinine Urine Ratio (Completed)   Ambulatory referral to Nephrology     Musculoskeletal and Integument   Rash    Areas of some inflammation.  Does not appear to be hypersensitivity reaction, pattern not consistent with sunburn or jewelry or irritants.  Not consistent with shingles this does not appear to be following any dermatomes.  Lesions on arm appear to be in the early stages of healing.   Will treat with Benadryl itch relief        Other   S/P BKA (below knee amputation) unilateral, right ([place])   Relevant Orders   For home use only DME Other see comment   Other Visit Diagnoses     Need for immunization against influenza    -  Primary   Relevant Orders   Flu Vaccine QUAD 19mo+IM (Fluarix, Fluzone & Alfiuria Quad PF) (Completed)          Depression, PHQ-9: Based on the patients  [place] Office Visit from 09/17/2020 in [place]  PHQ-9 Total Score 4      score we have 4.  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 tibialis and PTA of L. ant and post. tibial vessels 12/31/2011   SOB (shortness of breath)    uses inhaler prn   Type II diabetes mellitus ([place]) ~ 2002   Review of Systems:   Review of Systems  Constitutional: Negative.   Respiratory: Negative.    Gastrointestinal: Negative.   Genitourinary: Negative.   Musculoskeletal:        Leg pain  Skin:  Positive for rash.  Neurological:  Positive for sensory change and weakness.  Psychiatric/Behavioral: Negative.      Physical Exam: Vitals:   09/17/20 1559 09/17/20 1707  BP: (!) 162/82 (!) 178/96  Pulse: 97 96  Temp: 98.6 F (37 C)   TempSrc: Oral   SpO2: 100%   Weight: 188 lb (85.3 kg)   Height: 6' (1.829 m)      General: Alert and oriented no acute distress. HEENT: Conjunctiva nl , antiicteric sclerae, moist mucous membranes, no exudate or erythema Cardiovascular: Normal rate, regular rhythm.  No murmurs, rubs, or gallops Pulmonary : Equal breath sounds, No wheezes, rales, or rhonchi Abdominal: soft, nontender,  bowel sounds present Ext: BKA right, left leg decreased sensation to gross touch.  Reflexes 2+ strength 4-5/5 Skin: Hypopigmented papules along right upper extremity.  Erythematous papules on shoulder blades bilaterally and few papules right lower back.  Assessment & Plan:   See Encounters Tab for problem based charting.  Patient seen with Dr. Angelia Mould

## 2020-09-17 NOTE — Patient Instructions (Addendum)
Dear [name],    Today we evaluated you for the rash on your arm, the new pain and numbness in your foot, and the uncontrolled high blood pressure and kidney disease. We will give you a prescription for a benadryl cream to apply to the rash.  I have ordered the prosthetic sock.  We have placed a referral to vascular surgery to evaluate your leg.  We have also placed a referral to the nephrologist to further manage your kidney disease. Please continue to take your diabetes medications. It is also improtant that you start taking your blood pressure medications again as well. Please call us if you start having any dizziness or lightheadedness.  Please return to the clinic in 3 months for a follow up.

## 2020-09-18 ENCOUNTER — Other Ambulatory Visit (HOSPITAL_COMMUNITY): Payer: Self-pay

## 2020-09-18 LAB — BMP8+ANION GAP
Anion Gap: 14 mmol/L (ref 10.0–18.0)
BUN/Creatinine Ratio: 6 — ABNORMAL LOW (ref 10–24)
BUN: 21 mg/dL (ref 8–27)
CO2: 19 mmol/L — ABNORMAL LOW (ref 20–29)
Calcium: 8.1 mg/dL — ABNORMAL LOW (ref 8.6–10.2)
Chloride: 96 mmol/L (ref 96–106)
Creatinine, Ser: 3.28 mg/dL — ABNORMAL HIGH (ref 0.76–1.27)
Glucose: 91 mg/dL (ref 65–99)
Potassium: 3.9 mmol/L (ref 3.5–5.2)
Sodium: 129 mmol/L — ABNORMAL LOW (ref 134–144)
eGFR: 21 mL/min/{1.73_m2} — ABNORMAL LOW (ref >59)

## 2020-09-18 LAB — MICROALBUMIN / CREATININE URINE RATIO
Creatinine, Urine: 19.8 mg/dL
Microalb/Creat Ratio: 4113 mg/g creat — ABNORMAL HIGH (ref 0–29)
Microalbumin, Urine: 814.4 ug/mL

## 2020-09-22 ENCOUNTER — Encounter: Payer: Self-pay | Admitting: Internal Medicine

## 2020-09-22 DIAGNOSIS — R21 Rash and other nonspecific skin eruption: Secondary | ICD-10-CM | POA: Insufficient documentation

## 2020-09-22 NOTE — Assessment & Plan Note (Signed)
Patient reports compliance with insulin.  A1c improved from last visit down to 7.9 today.  We will continue with current medications.

## 2020-09-22 NOTE — Assessment & Plan Note (Signed)
Areas of some inflammation.  Does not appear to be hypersensitivity reaction, pattern not consistent with sunburn or jewelry or irritants.  Not consistent with shingles this does not appear to be following any dermatomes.  Lesions on arm appear to be in the early stages of healing.   Will treat with Benadryl itch relief

## 2020-09-22 NOTE — Assessment & Plan Note (Signed)
Kidney disease recently progressed.  Creatinine 3.2 up from 2.41-year ago.  GFR down to 21. likely due to uncontrolled hypertension.   Discussed with the patient the relationship between uncontrolled blood pressure and progression of kidney disease.  I will also discussed long-term complications of renal failure including dialysis.  Advised patient to restart his blood pressure medications.  Referral to nephrology placed.

## 2020-09-22 NOTE — Assessment & Plan Note (Signed)
Patient endorses recent change in sensation of the left lower extremity.  Reports that for the past 2 weeks he has had aching pain in the leg.  No having numbness to gross touch.  Reporting trouble moving over the past year.  Denies swelling.  Patient has history of peripheral vascular disease and is status post BKA to the right leg.  Referral to vascular surgery placed.

## 2020-09-22 NOTE — Assessment & Plan Note (Signed)
Blood pressure elevated 160s and 170s on recheck.  He reports that he has not been taking his medications and has not filled them in a while.  He denies any headache vision changes chest pain shortness of breath today.  Discussed with him that it is important to control his blood pressure to prevent future complications such as stroke heart attack renal disease and progression of his peripheral vascular disease. Advised patient to restart taking his blood pressure medications.

## 2020-09-23 ENCOUNTER — Telehealth: Payer: Self-pay

## 2020-09-23 ENCOUNTER — Ambulatory Visit: Payer: Medicare Other | Admitting: Pharmacist

## 2020-09-23 NOTE — Telephone Encounter (Signed)
I have faxed information to [facility] now [employer] information received patient has appointment has been made for patient on 09-29-2020@ 11:00

## 2020-09-23 NOTE — Progress Notes (Signed)
Internal Medicine Clinic Attending  I saw and evaluated the patient.  I personally confirmed the key portions of the history and exam documented by Dr. Gawaluck and I reviewed pertinent patient test results.  The assessment, diagnosis, and plan were formulated together and I agree with the documentation in the resident's note.  

## 2020-09-24 ENCOUNTER — Other Ambulatory Visit (HOSPITAL_COMMUNITY): Payer: Self-pay

## 2020-09-25 ENCOUNTER — Ambulatory Visit: Payer: Medicare Other | Admitting: Pharmacist

## 2020-09-28 ENCOUNTER — Ambulatory Visit: Payer: Medicare Other

## 2020-09-30 ENCOUNTER — Other Ambulatory Visit (HOSPITAL_COMMUNITY): Payer: Self-pay

## 2020-09-30 ENCOUNTER — Ambulatory Visit (INDEPENDENT_AMBULATORY_CARE_PROVIDER_SITE_OTHER): Payer: Medicare Other | Admitting: Internal Medicine

## 2020-09-30 ENCOUNTER — Encounter: Payer: Self-pay | Admitting: Internal Medicine

## 2020-09-30 ENCOUNTER — Other Ambulatory Visit: Payer: Self-pay

## 2020-09-30 VITALS — BP 179/96 | HR 96 | Temp 98.0°F | Ht 72.0 in | Wt 176.6 lb

## 2020-09-30 DIAGNOSIS — Z9114 Patient's other noncompliance with medication regimen: Secondary | ICD-10-CM | POA: Insufficient documentation

## 2020-09-30 DIAGNOSIS — I739 Peripheral vascular disease, unspecified: Secondary | ICD-10-CM

## 2020-09-30 DIAGNOSIS — IMO0002 Reserved for concepts with insufficient information to code with codable children: Secondary | ICD-10-CM

## 2020-09-30 DIAGNOSIS — E1122 Type 2 diabetes mellitus with diabetic chronic kidney disease: Secondary | ICD-10-CM

## 2020-09-30 DIAGNOSIS — E118 Type 2 diabetes mellitus with unspecified complications: Secondary | ICD-10-CM | POA: Diagnosis not present

## 2020-09-30 DIAGNOSIS — E1165 Type 2 diabetes mellitus with hyperglycemia: Secondary | ICD-10-CM

## 2020-09-30 DIAGNOSIS — I10 Essential (primary) hypertension: Secondary | ICD-10-CM | POA: Diagnosis not present

## 2020-09-30 DIAGNOSIS — G629 Polyneuropathy, unspecified: Secondary | ICD-10-CM | POA: Diagnosis not present

## 2020-09-30 DIAGNOSIS — N183 Chronic kidney disease, stage 3 unspecified: Secondary | ICD-10-CM

## 2020-09-30 MED ORDER — AMLODIPINE BESYLATE 10 MG PO TABS
10.0000 mg | ORAL_TABLET | Freq: Every day | ORAL | 3 refills | Status: DC
  Filled 2020-09-30: qty 90, 90d supply, fill #0

## 2020-09-30 NOTE — Assessment & Plan Note (Addendum)
Patient following up for uncontrolled hypertension today. BP was elevated 170's last OV and patient admitted that he was not taking medications. Today he still has not started taking his medications. BP 170's and asymptomatic. He reports that he did not know he had refills for his medications. Again discussed with pt complications of uncontrolled hypertension including worsening of his kidney disease. Also informed him that he does in fact have refills. It is likely that patient was non-compliant with blood pressure medications as he cannot tell me which medications he is supposed to take. Because of this, feel as though restarting his multiple BP meds at once may have poor outcomes, will restart his Amlodipine 10mg  for now given worsening renal function and hyponatremia on past BMP.   Informed patient that we will start just the amlodipine for now, but that adding on future medications will likely be necessary to get his BP to goal. Patient also advised to bring all medications to next visit so that we can discuss medications with him. We will see him back in 2 weeks. Will likely need close follow up until control over chronic problems can be regained and pt is established with specialist care.

## 2020-09-30 NOTE — Assessment & Plan Note (Signed)
Pt with continued medication non-adherence. Pt reports that he did not realize he had medication refills. Will need to determine cause for patient's adherence issues.  May benefit from CCM referral in the future.

## 2020-09-30 NOTE — Assessment & Plan Note (Addendum)
Pt reporting pain in right stump 2/2 poor prosthetic fit. Reports that he has noticed some irritation at end of stump. He has been using sponges to provide extra cushion. Has not yet received replacement sock. Additionally, he reports continued pain, decreased sensation, and trouble moving left leg unchanged from prior visit. Unable to palpate dorsalis pedis pulse, posterior tibialis pulse intact, found with doppler ultrasound. Limb warm.  He was referred to vascular surgery last OV due to concern for worsening lower limb perfusion. He reports that the has not followed up with them for evaluation because he was afraid the would say that it was necessary to amputate his leg. Discussed with the patient that we cannot know what sort of intervention the vascular specialists would recommend until they see him. Additionally, advised him to follow up with vascular sooner since avoiding the evaluation may only worsen outcomes. He voiced his understanding.  We will help him arrange appointment to f/u with vascular referral. Will f/u with DME prosthetic sock as well.

## 2020-09-30 NOTE — Assessment & Plan Note (Addendum)
Past A1c 7.9. Patient reports good compliance with his insulin. He is able to tell me exactly what his regimen is. Endorses that despite non-compliance with other medications, he does in fact take insulin. Upon review of medication dispense, most recent dispense date for insulins was 04/2020. Reports that he fills medications at [facility]. Per [facility] and [facility] pharmacies, he fills medications through [facility]. Unsure as to why dispense dates are not reflecting this. Instructed pt to bring all medications with him to next OV so that we can work with him to determine which medications he fills and where.   Additionally, pt reporting numbness, pins and needles sensation in hands with resulting difficulty tying shoes and fumbling while zipping up pants. Symmetric change in sensation. Given distribution and questionable insulin adherence, feel this is likely related to his diabetes. However, we will obtain B12 to evaluate for vitamin deficiency

## 2020-09-30 NOTE — Progress Notes (Addendum)
CC: Follow-up  HPI:[name] is a 60 y.o. male who presents for evaluation of follow-up. Please see individual problem based A/P for details.  Please see encounters tab for problem based charting.  Problem List Items Addressed This Visit       Cardiovascular and Mediastinum   PVD (peripheral vascular disease) ([place]) (Chronic)    Pt reporting pain in right stump 2/2 poor prosthetic fit. Reports that he has noticed some irritation at end of stump. He has been using sponges to provide extra cushion. Has not yet received replacement sock. Additionally, he reports continued pain, decreased sensation, and trouble moving left leg unchanged from prior visit. He was referred to vascular surgery last OV due to concern for worsening lower limb perfusion. He reports that the has not followed up with them for evaluation because he was afraid the would say that it was necessary to amputate his leg. Discussed with the patient that we cannot know what sort of intervention the vascular specialists would recommend until they see him. Additionally, advised him to follow up with vascular sooner since avoiding the evaluation may only worsen outcomes. He voiced his understanding.  We will help him arrange appointment to f/u with vascular referral. Will f/u with DME prosthetic sock as well.      Relevant Medications   amLODipine (NORVASC) 10 MG tablet   Essential hypertension - Primary    Patient following up for uncontrolled hypertension today. BP was elevated 170's last OV and patient admitted that he was not taking medications. Today he still has not started taking his medications. BP 170's and asymptomatic. He reports that he did not know he had refills for his medications. Again discussed with pt complications of uncontrolled hypertension including worsening of his kidney disease. Also informed him that he does in fact have refills. It is likely that patient was non-compliant with blood pressure medications  as he cannot tell me which medications he is supposed to take. Because of this, feel as though restarting his multiple BP meds at once may have poor outcomes, will restart his Amlodipine 10mg  for now given worsening renal function and hyponatremia on past BMP.   Informed patient that we will start just the amlodipine for now, but that adding on future medications will likely be necessary to get his BP to goal. Patient also advised to bring all medications to next visit so that we can discuss medications with him. We will see him back in 2 weeks. Will likely need close follow up until control over chronic problems can be regained and pt is established with specialist care.      Relevant Medications   amLODipine (NORVASC) 10 MG tablet   Other Relevant Orders   BMP8+Anion Gap     Endocrine   Diabetes mellitus type 2, uncontrolled, with complications ([facility])    Past A1c 7.9. Patient reports good compliance with his insulin. He is able to tell me exactly what his regimen is. Endorses that despite non-compliance with other medications, he does in fact take insulin. Upon review of medication dispense, most recent dispense date for insulins was 04/2020. Reports that he fills medications at [facility]. Per [facility], pt has been filling medications through [facility], however, [facility] reporting he fills medications through [facility]. Instructed pt to bring all medications with him to next OV so that we can work with him to determine which medications he fills and where.   Additionally, pt reporting numbness, pins and needles sensation in hands  with resulting difficulty tying shoes and fumbling while zipping up pants. Symmetric change in sensation. Given distribution and questionable insulin adherence, feel this is likely related to his diabetes. However, we will obtain B12 to evaluate for vitamin deficiency       CKD stage 3 due to type 2 diabetes mellitus ([place])    Pt did not follow up with nephrology  referral that was placed last visit. States no one contacted him. Discussed again the importance of managing his kidney disease to prevent long term complications including dialysis.    Will help arrange kidney f/u. Needs to gain control over HTN and improve diabetes control. BMP ordered to assess for progression of disease since last visit.        Other   Hx of medication noncompliance    Pt with continued medication non-adherence. Pt reports that he did not realize he had medication refills. Will need to determine cause for patient's adherence issues.  May benefit from CCM referral in the future.      Other Visit Diagnoses     Neuropathy       Relevant Orders   Vitamin B12       Depression, PHQ-9: Based on the patients  [place] Office Visit from 09/30/2020 in [place]  PHQ-9 Total Score 0      score we have 0.  Past Medical History:  Diagnosis Date   CHF (congestive heart failure) (H[facility])    Critical lower limb ischemia, lt with ABI of 0.60 12/31/2011   Gangrene ([place])    right foot   GERD (gastroesophageal reflux disease)    Hyperlipidemia    Hypertension    Neuromuscular disorder ([place])    diabetic neruopathy - hands   Osteomyelitis ([place]) 2010   left foot, s/p midfoot amputation   Osteomyelitis ([place]) 09/2013   RT BKA   Osteomyelitis of ankle or foot 05/2011   rt foot, s/p 5th ray amputation   PAD (peripheral artery disease) ([place])    ABIs 11/30/11: L ABI 0.68, R ABI 0.84   Pneumonia 2010   PVD (peripheral vascular disease) ([place]) 12/31/2011   S/P angioplasty with stent, 12/30/11, of Lt SFA, Post. tibialis and PTA of L. ant and post. tibial vessels 12/31/2011   SOB (shortness of breath)    uses inhaler prn   Type II diabetes mellitus ([place]) ~ 2002   Review of Systems:   Review of Systems  Constitutional: Negative.   HENT: Negative.    Respiratory: Negative.    Cardiovascular: Negative.   Gastrointestinal: Negative.    Genitourinary: Negative.   Musculoskeletal: Negative.   Skin: Negative.   Neurological:  Positive for tingling, sensory change, focal weakness and weakness.  Psychiatric/Behavioral: Negative.      Physical Exam: Vitals:   09/30/20 1427  BP: (!) 179/96  Pulse: 96  Temp: 98 F (36.7 C)  TempSrc: Oral  SpO2: 100%  Weight: 176 lb 9.6 oz (80.1 kg)  Height: 6' (1.829 m)     General: alert and oriented, no acute distress HEENT: Conjunctiva nl , antiicteric sclerae, moist mucous membranes, no exudate or erythema Cardiovascular: Normal rate, regular rhythm.  No murmurs, rubs, or gallops Pulmonary : Equal breath sounds, No wheezes, rales, or rhonchi Abdominal: soft, nontender,  bowel sounds present Ext: Callus on right stump but no breaks in the skin, no edema or erythema Left foot Decreased sense to gross touch, no fine touch left Transmetatarsal ampuptation. Crack in skin in between 1 +  2 tarsal bones Able to move extremity  Assessment & Plan:   See Encounters Tab for problem based charting.  Patient seen with Dr.  Saverio Danker

## 2020-09-30 NOTE — Patient Instructions (Signed)
Dear [name],  Today we discussed your high blood pressure, leg pain, and kidney disease.   We would like for [name] to restart the Amlodpine 10mg  to help control your blood pressure.   We placed a referral to the vascular specialists as well as the kidney doctors to help with your leg pain and worsening kidney disease. We will talk with our front desk to make sure those offices reach out to [name] to schedule an appointment. It will be very important to be evaluated by these doctors. I will also send in a new order for the prosthetic sock.  We will check some labwork today.  Please return in 1 month for blood pressure check. Please bring your medications with [name] to that visit.

## 2020-09-30 NOTE — Assessment & Plan Note (Signed)
Pt did not follow up with nephrology referral that was placed last visit. States no one contacted him. Discussed again the importance of managing his kidney disease to prevent long term complications including dialysis.    Will help arrange kidney f/u. Needs to gain control over HTN and improve diabetes control. BMP ordered to assess for progression of disease since last visit.

## 2020-10-01 LAB — VITAMIN B12: Vitamin B-12: 262 pg/mL (ref 232–1245)

## 2020-10-01 LAB — BMP8+ANION GAP
Anion Gap: 15 mmol/L (ref 10.0–18.0)
BUN/Creatinine Ratio: 8 — ABNORMAL LOW (ref 10–24)
BUN: 24 mg/dL (ref 8–27)
CO2: 21 mmol/L (ref 20–29)
Calcium: 8.6 mg/dL (ref 8.6–10.2)
Chloride: 100 mmol/L (ref 96–106)
Creatinine, Ser: 3.17 mg/dL — ABNORMAL HIGH (ref 0.76–1.27)
Glucose: 253 mg/dL — ABNORMAL HIGH (ref 70–99)
Potassium: 4.8 mmol/L (ref 3.5–5.2)
Sodium: 136 mmol/L (ref 134–144)
eGFR: 22 mL/min/{1.73_m2} — ABNORMAL LOW (ref >59)

## 2020-10-01 NOTE — Progress Notes (Signed)
Internal Medicine Clinic Attending  I saw and evaluated the patient.  I personally confirmed the key portions of the history and exam documented by Dr. Elliot Gurney and I reviewed pertinent patient test results.  The assessment, diagnosis, and plan were formulated together and I agree with the documentation in the resident's note.   Charise Killian, MD

## 2020-10-03 NOTE — Addendum Note (Signed)
Addended by: Delene Ruffini T on: 10/03/2020 01:23 PM   Modules accepted: Orders

## 2020-10-08 ENCOUNTER — Other Ambulatory Visit (HOSPITAL_COMMUNITY): Payer: Self-pay

## 2020-10-13 ENCOUNTER — Other Ambulatory Visit (HOSPITAL_COMMUNITY): Payer: Self-pay

## 2020-10-15 ENCOUNTER — Encounter: Payer: Medicare Other | Admitting: Internal Medicine

## 2020-10-20 ENCOUNTER — Ambulatory Visit (INDEPENDENT_AMBULATORY_CARE_PROVIDER_SITE_OTHER): Payer: Medicare Other | Admitting: Internal Medicine

## 2020-10-20 ENCOUNTER — Encounter: Payer: Self-pay | Admitting: Internal Medicine

## 2020-10-20 ENCOUNTER — Other Ambulatory Visit (HOSPITAL_COMMUNITY): Payer: Self-pay

## 2020-10-20 ENCOUNTER — Other Ambulatory Visit: Payer: Self-pay

## 2020-10-20 VITALS — BP 178/97 | HR 87 | Temp 98.8°F | Resp 32 | Ht 72.0 in | Wt 175.0 lb

## 2020-10-20 DIAGNOSIS — I10 Essential (primary) hypertension: Secondary | ICD-10-CM

## 2020-10-20 DIAGNOSIS — E1142 Type 2 diabetes mellitus with diabetic polyneuropathy: Secondary | ICD-10-CM

## 2020-10-20 DIAGNOSIS — Z794 Long term (current) use of insulin: Secondary | ICD-10-CM

## 2020-10-20 DIAGNOSIS — Z89432 Acquired absence of left foot: Secondary | ICD-10-CM

## 2020-10-20 DIAGNOSIS — I739 Peripheral vascular disease, unspecified: Secondary | ICD-10-CM

## 2020-10-20 DIAGNOSIS — K219 Gastro-esophageal reflux disease without esophagitis: Secondary | ICD-10-CM | POA: Diagnosis not present

## 2020-10-20 DIAGNOSIS — R053 Chronic cough: Secondary | ICD-10-CM

## 2020-10-20 DIAGNOSIS — E1159 Type 2 diabetes mellitus with other circulatory complications: Secondary | ICD-10-CM

## 2020-10-20 DIAGNOSIS — E782 Mixed hyperlipidemia: Secondary | ICD-10-CM

## 2020-10-20 MED ORDER — HYDROCHLOROTHIAZIDE 12.5 MG PO TABS
25.0000 mg | ORAL_TABLET | Freq: Every day | ORAL | 6 refills | Status: DC
  Filled 2020-10-20: qty 90, 45d supply, fill #0

## 2020-10-20 MED ORDER — CARVEDILOL 6.25 MG PO TABS
6.2500 mg | ORAL_TABLET | Freq: Two times a day (BID) | ORAL | 3 refills | Status: DC
  Filled 2020-10-20 – 2021-02-03 (×2): qty 180, 90d supply, fill #0

## 2020-10-20 MED ORDER — CETIRIZINE HCL 10 MG PO TABS
10.0000 mg | ORAL_TABLET | Freq: Every day | ORAL | 2 refills | Status: DC
  Filled 2020-10-20: qty 30, 30d supply, fill #0

## 2020-10-20 MED ORDER — PREGABALIN 50 MG PO CAPS
50.0000 mg | ORAL_CAPSULE | Freq: Two times a day (BID) | ORAL | 2 refills | Status: DC
  Filled 2020-10-20: qty 60, 30d supply, fill #0

## 2020-10-20 MED ORDER — DULOXETINE HCL 30 MG PO CPEP
60.0000 mg | ORAL_CAPSULE | Freq: Every day | ORAL | 0 refills | Status: DC
  Filled 2020-10-20: qty 60, 30d supply, fill #0

## 2020-10-20 MED ORDER — ROSUVASTATIN CALCIUM 20 MG PO TABS
20.0000 mg | ORAL_TABLET | Freq: Every day | ORAL | 3 refills | Status: DC
  Filled 2020-10-20: qty 90, 90d supply, fill #0

## 2020-10-20 MED ORDER — ASPIRIN EC 81 MG PO TBEC
81.0000 mg | DELAYED_RELEASE_TABLET | Freq: Every day | ORAL | 2 refills | Status: DC
Start: 2020-10-20 — End: 2021-05-20
  Filled 2020-10-20: qty 150, 150d supply, fill #0

## 2020-10-20 MED ORDER — FLUTICASONE PROPIONATE 50 MCG/ACT NA SUSP
1.0000 | Freq: Every day | NASAL | 0 refills | Status: DC
  Filled 2020-10-20: qty 16, 60d supply, fill #0

## 2020-10-20 MED ORDER — AMLODIPINE BESYLATE 10 MG PO TABS
10.0000 mg | ORAL_TABLET | Freq: Every day | ORAL | 3 refills | Status: DC
  Filled 2020-10-20: qty 90, 90d supply, fill #0

## 2020-10-20 MED ORDER — BASAGLAR KWIKPEN 100 UNIT/ML ~~LOC~~ SOPN
22.0000 [IU] | PEN_INJECTOR | Freq: Every evening | SUBCUTANEOUS | 11 refills | Status: DC
  Filled 2020-10-20 – 2021-02-02 (×2): qty 15, 68d supply, fill #0

## 2020-10-20 MED ORDER — INSULIN ASPART 100 UNIT/ML FLEXPEN
11.0000 [IU] | PEN_INJECTOR | Freq: Three times a day (TID) | SUBCUTANEOUS | 2 refills | Status: DC
  Filled 2020-10-20: qty 45, 137d supply, fill #0
  Filled 2021-02-02: qty 9, 28d supply, fill #0

## 2020-10-20 MED ORDER — ESOMEPRAZOLE MAGNESIUM 40 MG PO CPDR
40.0000 mg | DELAYED_RELEASE_CAPSULE | Freq: Every day | ORAL | 3 refills | Status: DC
  Filled 2020-10-20: qty 90, 90d supply, fill #0

## 2020-10-20 NOTE — Progress Notes (Signed)
   CC: hypertension and diabetes follow up, stump pain  HPI:  [name] is a 60 y.o. male with PMHx as stated below presenting for hypertension and diabetes follow up. He also reports pain in his RLE stump and left foot. Please see problem based charting for complete assessment and paln.  Past Medical History:  Diagnosis Date   CHF (congestive heart failure) (HCC)    Critical lower limb ischemia, lt with ABI of 0.60 12/31/2011   Gangrene ([place])    right foot   GERD (gastroesophageal reflux disease)    Hyperlipidemia    Hypertension    Neuromuscular disorder ([place])    diabetic neruopathy - hands   Osteomyelitis ([place]) 2010   left foot, s/p midfoot amputation   Osteomyelitis ([place]) 09/2013   RT BKA   Osteomyelitis of ankle or foot 05/2011   rt foot, s/p 5th ray amputation   PAD (peripheral artery disease) ([place])    ABIs 11/30/11: L ABI 0.68, R ABI 0.84   Pneumonia 2010   PVD (peripheral vascular disease) ([place]) 12/31/2011   S/P angioplasty with stent, 12/30/11, of Lt SFA, Post. tibialis and PTA of L. ant and post. tibial vessels 12/31/2011   SOB (shortness of breath)    uses inhaler prn   Type II diabetes mellitus ([place]) ~ 2002   Review of Systems:  Negative except as stated in HPI.  Physical Exam:  Vitals:   10/20/20 1405 10/20/20 1413  BP: (!) 175/101 (!) 178/97  Pulse: 93 87  Resp: (!) 32   Temp: 98.8 F (37.1 C)   TempSrc: Oral   SpO2: 100%   Weight: 175 lb (79.4 kg)   Height: 6' (1.829 m)    Physical Exam  Constitutional: Appears well-developed and well-nourished. No distress.  HENT: Normocephalic and atraumatic Cardiovascular: Normal rate, regular rhythm, S1 and S2 present, no murmurs, rubs, gallops.  Distal pulses intact Respiratory: lungs are clear to auscultation bilaterally. Musculoskeletal: Normal bulk and tone.   RLE: BKA stump nonerythematous, nontender, no warmth or edema; no drainage noted LLE: s/p TMA; does have some skin breakdown at the lateral  aspect of the foot without edema, erythema, warmth or tenderness; no drainage noted from surgical site Neurological: Is alert and oriented x4, no apparent focal deficits noted. Skin: Warm and dry.  Psychiatric: Normal mood and affect. Behavior is normal. Judgment and thought content normal.    Assessment & Plan:   See Encounters Tab for problem based charting.  Patient seen with Dr. Philipp Ovens

## 2020-10-20 NOTE — Assessment & Plan Note (Signed)
Patient with history of PVD s/p RLE BKA and LLE TMA reports ongoing pain in his right stump due to poor prosthetic fitting. He notes dull pain that is improved with offloading from the limb. On exam, right lower extremity BKA stump does not appear to be acutely infected at this time.  He has been referred to vascular surgery; however, reports hesitation with seeing surgeon due to concern for possible amputation. Advised patient that he needs to follow up for at least prosthesis fitting and for evaluation of any further progression of his peripheral vascular disease. He does have an appointment on 10/28 with [facility].   Plan: Refilled rosuvastatin 20mg  daily Ordered aspirin 81mg  daily  F/u vascular surgery

## 2020-10-20 NOTE — Assessment & Plan Note (Signed)
Lipid Panel     Component Value Date/Time   CHOL 181 08/01/2019 1510   TRIG 87 08/01/2019 1510   HDL 35 (L) 08/01/2019 1510   CHOLHDL 5.2 (H) 08/01/2019 1510   CHOLHDL 3.7 05/25/2013 1544   VLDL 20 05/25/2013 1544   LDLCALC 130 (H) 08/01/2019 1510   LABVLDL 16 08/01/2019 1510   ASVD risk: 62%  Plan; Crestor 20mg  daily refilled  Repeat lipid panel at next visit

## 2020-10-20 NOTE — Assessment & Plan Note (Signed)
Patient requiring refills for his nexium. Refilled Nexium 40mg  dialy

## 2020-10-20 NOTE — Assessment & Plan Note (Signed)
Most recent A1c 7.9. Patient is on Basaglar 22u qHS and Novolog 11U tid with meals. However, notes that he has ran out of insulin for the past week. His sister helps him with his medications and checking his glucose. He reports that his glucose has been "not that high" but does not recall exact levels. He denies any fatigue, nausea, headaches, abdominal pain, polyuria or polydipsia at this time.   Plan: Basaglar 22U nightly and Novolog 11U tid with meals reordered  Advised patient to bring glucose meter to next visit

## 2020-10-20 NOTE — Patient Instructions (Addendum)
[name],  It was a pleasure seeing you in clinic. Today we discussed:   Blood pressure: Please take all your medications as prescribed. Follow up in 2 weeks for blood pressure check.   Diabetes:  Insulin refill sent to your pharmacy.  Leg pain: Please follow up with the VASCULAR SURGEON ON 10/28 for your Right leg prosthetic.  I have also sent a referral to ORTHOTICS to get your shoes better fitted.   If you have any questions or concerns, please call our clinic at [phone] between 9am-5pm and after hours call [phone] and ask for the internal medicine resident on call. If you feel you are having a medical emergency please call 911.   Thank you, we look forward to helping you remain healthy!

## 2020-10-20 NOTE — Assessment & Plan Note (Signed)
Patient has pain to left foot along the incision and the lateral side. Area is clean and dry without signs of infection but does have area along the lateral aspect of the foot that is discolored. Suspect that this may be in setting of poor fitting shoes.  Previously concerned for phantom limb syndrome.   Plan: Referral to orthotics for shoe fitting Refilled lyrica and duloxetine
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
## 2020-10-27 NOTE — Progress Notes (Signed)
Internal Medicine Clinic Attending ° °Case discussed with Dr. Aslam  At the time of the visit.  We reviewed the resident’s history and exam and pertinent patient test results.  I agree with the assessment, diagnosis, and plan of care documented in the resident’s note.  °

## 2020-10-28 ENCOUNTER — Ambulatory Visit (INDEPENDENT_AMBULATORY_CARE_PROVIDER_SITE_OTHER): Payer: Medicare Other | Admitting: Orthopedic Surgery

## 2020-10-28 ENCOUNTER — Other Ambulatory Visit: Payer: Self-pay

## 2020-10-28 ENCOUNTER — Encounter: Payer: Medicare Other | Admitting: Internal Medicine

## 2020-10-28 ENCOUNTER — Encounter: Payer: Self-pay | Admitting: Orthopedic Surgery

## 2020-10-28 DIAGNOSIS — Z89432 Acquired absence of left foot: Secondary | ICD-10-CM | POA: Diagnosis not present

## 2020-10-28 DIAGNOSIS — Z89511 Acquired absence of right leg below knee: Secondary | ICD-10-CM | POA: Diagnosis not present
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
## 2020-10-29 ENCOUNTER — Other Ambulatory Visit: Payer: Self-pay | Admitting: *Deleted

## 2020-10-29 DIAGNOSIS — I739 Peripheral vascular disease, unspecified: Secondary | ICD-10-CM

## 2020-10-29 DIAGNOSIS — I6523 Occlusion and stenosis of bilateral carotid arteries: Secondary | ICD-10-CM

## 2020-10-31 ENCOUNTER — Ambulatory Visit (INDEPENDENT_AMBULATORY_CARE_PROVIDER_SITE_OTHER): Payer: Medicare Other | Admitting: Vascular Surgery

## 2020-10-31 ENCOUNTER — Encounter: Payer: Self-pay | Admitting: Vascular Surgery

## 2020-10-31 ENCOUNTER — Other Ambulatory Visit: Payer: Self-pay

## 2020-10-31 ENCOUNTER — Ambulatory Visit (HOSPITAL_COMMUNITY)
Admission: RE | Admit: 2020-10-31 | Discharge: 2020-10-31 | Disposition: A | Discharge status: Home / Self Care | Payer: Medicare Other | Source: Ambulatory Visit | Attending: Vascular Surgery | Admitting: Vascular Surgery

## 2020-10-31 VITALS — BP 178/94 | HR 74 | Temp 97.9°F | Resp 20 | Ht 72.0 in | Wt 174.0 lb

## 2020-10-31 DIAGNOSIS — I739 Peripheral vascular disease, unspecified: Secondary | ICD-10-CM | POA: Insufficient documentation

## 2020-10-31 DIAGNOSIS — I70223 Atherosclerosis of native arteries of extremities with rest pain, bilateral legs: Secondary | ICD-10-CM
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
## 2020-11-05 ENCOUNTER — Other Ambulatory Visit: Payer: Self-pay

## 2020-11-05 DIAGNOSIS — I70223 Atherosclerosis of native arteries of extremities with rest pain, bilateral legs: Secondary | ICD-10-CM

## 2020-11-11 ENCOUNTER — Encounter: Payer: Self-pay | Admitting: *Deleted

## 2020-11-11 NOTE — Progress Notes (Unsigned)

## 2021-01-12 ENCOUNTER — Telehealth: Payer: Self-pay

## 2021-01-12 NOTE — Telephone Encounter (Signed)
Requesting PCS, please call pt's daughter back.

## 2021-01-14 DIAGNOSIS — I129 Hypertensive chronic kidney disease with stage 1 through stage 4 chronic kidney disease, or unspecified chronic kidney disease: Secondary | ICD-10-CM | POA: Diagnosis not present

## 2021-01-14 DIAGNOSIS — N184 Chronic kidney disease, stage 4 (severe): Secondary | ICD-10-CM | POA: Diagnosis not present

## 2021-01-14 DIAGNOSIS — E785 Hyperlipidemia, unspecified: Secondary | ICD-10-CM | POA: Diagnosis not present

## 2021-01-14 DIAGNOSIS — D631 Anemia in chronic kidney disease: Secondary | ICD-10-CM | POA: Diagnosis not present

## 2021-01-14 DIAGNOSIS — N390 Urinary tract infection, site not specified: Secondary | ICD-10-CM | POA: Diagnosis not present

## 2021-01-14 DIAGNOSIS — E1122 Type 2 diabetes mellitus with diabetic chronic kidney disease: Secondary | ICD-10-CM | POA: Diagnosis not present

## 2021-01-14 DIAGNOSIS — N2581 Secondary hyperparathyroidism of renal origin: Secondary | ICD-10-CM | POA: Diagnosis not present

## 2021-01-14 DIAGNOSIS — R809 Proteinuria, unspecified: Secondary | ICD-10-CM | POA: Diagnosis not present

## 2021-01-15 NOTE — Telephone Encounter (Signed)
Spoke with patient. At present he only has medicare coverage. States he is to be getting [facility] Medicaid. Asked patient to call me back when he gets new insurance coverage. Medicare do not cover PCS.

## 2021-01-16 ENCOUNTER — Other Ambulatory Visit: Payer: Self-pay | Admitting: Nephrology

## 2021-01-16 DIAGNOSIS — N184 Chronic kidney disease, stage 4 (severe): Secondary | ICD-10-CM

## 2021-01-16 NOTE — Telephone Encounter (Signed)
Returned call to patient's daughter Clarice Pole [phone])CQPEAKLTY PCS for her father. Explained to her that our records only show he has Medicare, patient will aslo need and office visit.  She is to call and schedule and appointment and his up dated insurance information

## 2021-01-19 ENCOUNTER — Encounter: Payer: Self-pay | Admitting: Vascular Surgery

## 2021-01-21 ENCOUNTER — Telehealth: Payer: Self-pay | Admitting: *Deleted

## 2021-01-21 ENCOUNTER — Other Ambulatory Visit (HOSPITAL_COMMUNITY): Payer: Self-pay

## 2021-01-21 ENCOUNTER — Other Ambulatory Visit: Payer: Self-pay | Admitting: Student

## 2021-01-21 ENCOUNTER — Telehealth: Payer: Self-pay | Admitting: Student

## 2021-01-21 DIAGNOSIS — Z794 Long term (current) use of insulin: Secondary | ICD-10-CM

## 2021-01-21 DIAGNOSIS — E119 Type 2 diabetes mellitus without complications: Secondary | ICD-10-CM

## 2021-01-21 MED ORDER — ACCU-CHEK FASTCLIX LANCETS MISC
12 refills | Status: DC
  Filled 2021-01-21: qty 102, 33d supply, fill #0

## 2021-01-21 MED ORDER — ACCU-CHEK AVIVA PLUS VI STRP
ORAL_STRIP | 12 refills | Status: DC
  Filled 2021-01-21: qty 100, 33d supply, fill #0

## 2021-01-21 NOTE — Telephone Encounter (Signed)
Pt's caregiver (daughter Kerrie [place] [phone]) calling to state she finally was able to get the pt seen @ [facility].  Notes are being faxed and she is concerned about his diabetes and  she was asked to call his PCP to f/u with his medications.  She is very concerned and needs help and someone to call him back.   The patient has been sch for :  Name: [name], [name] MRN: [id number]  Date: 01/27/2021 Status: Sch  Time: 2:15 PM Length: 30  Visit Type: OPEN ESTABLISHED [726] Copay: $0.00  Provider: Mike Craze, DO

## 2021-01-21 NOTE — Telephone Encounter (Signed)
PATIENT HAS UPCOMING CLINIC APPOINTMENT FOR January 24,2023 / PCS.

## 2021-01-21 NOTE — Telephone Encounter (Addendum)
Call to patient's daughter Clarice Pole.  Patient went to visit with Kidney doctor recently. Suggestion by Kidney doctor to have a CGM placed on patient.  Patient needs help to stop smoking.  Daugther would like for patient to get [place] if appropriate.  Patient needs refills on his Diabetes medications as he has relocated and daughter not sure if he has his diabetes medications.  Patient is scheduled for an appointment in the Clinics on 01/27/2021 and asked to bring in his CGM if approved and have a visit with D. Plyler to learn  how to insert and use.  Call to [place] patient has refills left on his Basaglar and Novolog Flexpen.  Patient needs Strips and Lancets for his Meter until the CGM is inserted.  Daughter also would like to get [facility] for patient.

## 2021-01-22 DIAGNOSIS — Z89511 Acquired absence of right leg below knee: Secondary | ICD-10-CM | POA: Diagnosis not present

## 2021-01-22 NOTE — Telephone Encounter (Signed)
Left voicemail for return call Debera Lat, RD 01/22/2021 10:09 AM.

## 2021-01-27 ENCOUNTER — Ambulatory Visit (INDEPENDENT_AMBULATORY_CARE_PROVIDER_SITE_OTHER): Payer: Commercial Managed Care - HMO | Admitting: Dietician

## 2021-01-27 ENCOUNTER — Encounter: Payer: Self-pay | Admitting: Internal Medicine

## 2021-01-27 ENCOUNTER — Ambulatory Visit (INDEPENDENT_AMBULATORY_CARE_PROVIDER_SITE_OTHER): Payer: Commercial Managed Care - HMO | Admitting: Internal Medicine

## 2021-01-27 ENCOUNTER — Encounter: Payer: Self-pay | Admitting: Dietician

## 2021-01-27 ENCOUNTER — Ambulatory Visit: Payer: Medicare Other

## 2021-01-27 ENCOUNTER — Other Ambulatory Visit: Payer: Self-pay

## 2021-01-27 ENCOUNTER — Other Ambulatory Visit (HOSPITAL_COMMUNITY): Payer: Self-pay

## 2021-01-27 VITALS — BP 138/75 | HR 79 | Temp 98.4°F | Ht 72.0 in | Wt 173.1 lb

## 2021-01-27 DIAGNOSIS — E1159 Type 2 diabetes mellitus with other circulatory complications: Secondary | ICD-10-CM

## 2021-01-27 DIAGNOSIS — I739 Peripheral vascular disease, unspecified: Secondary | ICD-10-CM

## 2021-01-27 DIAGNOSIS — E1151 Type 2 diabetes mellitus with diabetic peripheral angiopathy without gangrene: Secondary | ICD-10-CM

## 2021-01-27 DIAGNOSIS — I10 Essential (primary) hypertension: Secondary | ICD-10-CM

## 2021-01-27 DIAGNOSIS — E1122 Type 2 diabetes mellitus with diabetic chronic kidney disease: Secondary | ICD-10-CM

## 2021-01-27 DIAGNOSIS — Z794 Long term (current) use of insulin: Secondary | ICD-10-CM | POA: Diagnosis not present

## 2021-01-27 DIAGNOSIS — N183 Chronic kidney disease, stage 3 unspecified: Secondary | ICD-10-CM | POA: Diagnosis not present

## 2021-01-27 DIAGNOSIS — I129 Hypertensive chronic kidney disease with stage 1 through stage 4 chronic kidney disease, or unspecified chronic kidney disease: Secondary | ICD-10-CM | POA: Diagnosis not present

## 2021-01-27 LAB — POCT GLYCOSYLATED HEMOGLOBIN (HGB A1C): Hemoglobin A1C: 11.9 % — AB (ref 4.0–5.6)

## 2021-01-27 LAB — GLUCOSE, CAPILLARY: Glucose-Capillary: 279 mg/dL — ABNORMAL HIGH (ref 70–99)

## 2021-01-27 NOTE — Assessment & Plan Note (Signed)
Hemoglobin A1c is 11.9 today.  Up from 7.94 months ago.  Patient does state that he has not been taking insulin for several weeks and per chart review it looks like this was never refilled in October.  He also had a CGM placed today.  He is to follow-up with [name] in 1 week.  Stressed the importance of him restarting his insulin and close follow-up.  -Insulin glargine 22 units nightly  -Insulin aspart 11 units 3 times daily with meals -Follow-up in 1 week  for CGM

## 2021-01-27 NOTE — Patient Instructions (Signed)
Please record the time, amount and what food drinks and activities you have while wearing the continuous glucose monitor (CGM).  Bring the folder with you to follow up appointments. If your monitor falls off, please place it in the bag provided in your folder and bring it back with you to your next appointment.   Do not have a CT or an MRI while wearing the CGM.   1 week visit has been set up with me and a doctor for the first of two CGM downloads.   You will also return in 2 weeks to have your second download and the CGM removed.  For a a personal Continuous glucose monitoring you will ned to download the App for the [employer]. You need to find out what your AppleID user name and passwords are to be able to download it. Then we'll get you up and running with your own Continuous glucose monitor.   [name] [phone]

## 2021-01-27 NOTE — Progress Notes (Signed)
° °  CC: PVD, requesting PCS  HPI:  Mr.Trevonte Shells is a 61 y.o. with a past medical history listed below presenting for evaluation of his peripheral vascular disease and requesting personal care services. For details of today's visit and the status of his chronic medical issues please refer to the assessment and plan.   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 tibialis and PTA of L. ant and post. tibial vessels 12/31/2011   SOB (shortness of breath)    uses inhaler prn   Type II diabetes mellitus ([place]) ~ 2002   Review of Systems:   Review of Systems  Constitutional:  Negative for chills and fever.  Musculoskeletal:  Positive for joint pain and myalgias. Negative for falls.  Neurological:  Positive for sensory change and weakness.    Physical Exam:  Vitals:   01/27/21 1334 01/27/21 1336 01/27/21 1433  BP:  (!) 155/79 138/75  Pulse:  81 79  Temp:  98.4 F (36.9 C)   TempSrc:  Oral   SpO2:  100%   Weight:  173 lb 1.6 oz (78.5 kg)   Height: 6' (1.829 m) 6' (1.829 m)    Physical Exam General: alert, appears stated age, in no acute distress HEENT: Normocephalic, atraumatic, EOM intact, conjunctiva normal CV: Regular rate and rhythm, no murmurs rubs or gallops, dorsalis pedis pulses faint, popliteal pulses appreciated  Pulm:  Clear to auscultation bilaterally, normal work of breathing Abdomen: Soft, nondistended, bowel sounds present, no tenderness to palpation MSK: No lower extremity edema, decreased sensation of left lower extremity below the knee Skin: Warm and dry, chronic skin changes of left lower extremity which appear dry, scaly and with skin darkening, chronic surgical wound of left foot transmetatarsal amputation is clean and dry, no acute wounds or lesions Neuro: Alert and oriented x3   Assessment & Plan:   See Encounters Tab for problem based charting.  Patient discussed with Dr. Jimmye Norman

## 2021-01-27 NOTE — Patient Instructions (Addendum)
[name],  It was a pleasure meeting you today.  All of your refills are waiting at your pharmacy.  Please call me if you have any trouble getting these medications.  It is really important that you get back on these medications.  I want you to follow-up with your vascular surgeon as soon as possible.  Call and schedule an appointment with Dr. Unk Lightning or any of his colleagues with [facility].  Their number is [phone].  It is very critical that you follow-up with them as soon as possible in order to salvage her left leg.  I want to follow-up with you in 2 to 3 weeks to make sure you resumed all of your medications, check your blood pressure and make sure that you have been seen by vascular surgery.  Thank you for allowing [place] to be part your care!

## 2021-01-27 NOTE — Assessment & Plan Note (Signed)
Patient presents today for evaluation of his left lower extremity pain and stiffness.  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  He states that he has been having left lower extremity throbbing and stiffness.  States that he is normally able to dorsiflex and plantarflex without any difficulty but this now feels stiff.  He also has a constant throbbing pain.  States that the symptoms have been going on for the past several months.  Denies any falls.  States prior to his new prosthetic device for his right lower extremity he was having difficulty ambulating and more frequently falling.  Ever since that was replaced he denies any difficulties with the right lower extremity.  He has not followed up with his vascular surgeon since October.  He endorsed compliance with his medications however per chart review it looks like these were never refilled since October.  On exam, dorsalis pedis pulse was very faint.  Popliteal pulse was appreciated.  He had chronic skin changes of the left lower extremity that were dry, scaly and with skin darkening.  No tenderness to palpation.  No sensation below the knee.  Assessment/plan: Reiterated the importance of continuing his medications which included rosuvastatin 20 mg and aspirin 81 mg daily. Recommended he call vascular surgery today to schedule an appointment given the moderate to severe disease of his left lower extremity.

## 2021-01-27 NOTE — Assessment & Plan Note (Addendum)
Vitals:   01/27/21 1336  BP: (!) 155/79   Blood pressure elevated today.  Patient states that he did not take his blood pressures today.  States he has been out for several weeks.  Per chart review it looks like he never picked up his refills back in October 2022.  Stressed the importance of resuming his blood pressure medications.  Patient states that he will pick up his meds today.  States that sometimes it is hard for him to get his medications due to transportation.  His sister or other family members will pick up medications for him.  Discussed that we can transition his medications to home delivery if that is more feasible.  -Resume amlodipine 10 mg, hydrochlorothiazide 25 and Coreg 6.25 mg twice daily -Recommended following up in 2 weeks for blood pressure check and to assess compliance to medication -Refill medications to home delivery service, will discuss this with our clinical pharmacist

## 2021-01-27 NOTE — Progress Notes (Signed)
Diabetes Self-Management Education  Visit Type: Follow-up (initial visit 2021- plan too extensive. patient didn't follow up; ammended plan today)  Appt. Start Time: 135p Appt. End Time: 215p  01/27/2021  [name], identified by name and date of birth, is a 61 y.o. male with a diagnosis of Diabetes:  .   ASSESSMENT Patient requested foot exam today and help to quit smoking.  Documentation for Freestyle Libre Pro Continuous glucose monitoring Freestyle Libre Pro CGM sensor placed today. Patient was educated about wearing sensor, keeping food, activity and medication log and when to call office. Patient was educated about how to care for the sensor and not to have an MRI, CT or Diathermy while wearing the sensor. Follow up was arranged with the patient for 1 week.   Lot #: [id number] Serial #: [id number] Expiration Date: 05/03/2021  Debera Lat, RD 01/27/2021 3:16 PM.   Lab Results  Component Value Date   HGBA1C 7.9 (A) 09/17/2020   HGBA1C 8.9 (A) 04/14/2020   HGBA1C 12.1 (A) 11/23/2019   HGBA1C 11.5 (A) 08/01/2019   HGBA1C 11.8 (A) 04/05/2019    Estimated body mass index is 23.48 kg/m as calculated from the following:   Height as of an earlier encounter on 01/27/21: 6' (1.829 m).   Weight as of an earlier encounter on 01/27/21: 173 lb 1.6 oz (78.5 kg).    Diabetes Self-Management Education - 01/27/21 1400       Visit Information   Visit Type Follow-up   initial visit 2021- plan too extensive. patient didn't follow up; ammended plan today     Health Coping   How would you rate your overall health? Fair   foot, apetite issues; unable to recall his medications; beginning stages of dementia     Psychosocial Assessment   Patient Belief/Attitude about Diabetes Defeat/Burnout    Self-care barriers Lack of material resources    Self-management support Family    Patient Concerns Medication   wanted refills   Special Needs Unable to determine    Preferred Learning Style No  preference indicated    Learning Readiness Contemplating    How often do you need to have someone help you when you read instructions, pamphlets, or other written materials from your doctor or pharmacy? 4 - Often    What is the last grade level you completed in school? --   plan to complete at future visit     Pre-Education Assessment   Patient understands the diabetes disease and treatment process. --   not needed at this time   Patient understands incorporating nutritional management into lifestyle. Needs Review    Patient undertands incorporating physical activity into lifestyle. --   not needed at this time   Patient understands using medications safely. Needs Review    Patient understands monitoring blood glucose, interpreting and using results Needs Review    Patient understands prevention, detection, and treatment of acute complications. Needs Review    Patient understands prevention, detection, and treatment of chronic complications. --   not needed at this time; caregivers will need to be instructed   Patient understands how to develop strategies to address psychosocial issues. --   not needed at this time   Patient understands how to develop strategies to promote health/change behavior. --   not needed at this time     Complications   Last HgB A1C per patient/outside source 7.9 %    How often do you check your blood sugar? 0 times/day (not  testing)      Subsequent Visit   Since your last visit have you continued or begun to take your medications as prescribed? Yes   Taking mealtime insulin after he eats; family prepares insulin, he only injects it.   Since your last visit have you had your blood pressure checked? Yes    Is your most recent blood pressure lower, unchanged, or higher since your last visit? Lower    Since your last visit have you experienced any weight changes? Loss    Weight Loss (lbs) 7    Since your last visit, are you checking your blood glucose at least once a  day? No             Individualized Plan for Diabetes Self-Management Training:   Learning Objective:  Patient will have a greater understanding of diabetes self-management. Patient education plan is to attend individual and/or group sessions per assessed needs and concerns.   Plan:   Patient Instructions  Please record the time, amount and what food drinks and activities you have while wearing the continuous glucose monitor (CGM).  Bring the folder with you to follow up appointments. If your monitor falls off, please place it in the bag provided in your folder and bring it back with you to your next appointment.   Do not have a CT or an MRI while wearing the CGM.   1 week visit has been set up with me and a doctor for the first of two CGM downloads.   You will also return in 2 weeks to have your second download and the CGM removed.  For a a personal Continuous glucose monitoring you will ned to download the App for the [employer]. You need to find out what your Apple[employer] user name and passwords are to be able to download it. Then we'll get you up and running with your own Continuous glucose monitor.   [name] [phone]   Expected Outcomes:     Education material provided: Diabetes Resources  If problems or questions, patient to contact team via:  Phone  Future DSME appointment:  1 week Debera Lat, RD 01/27/2021 3:18 PM.

## 2021-01-27 NOTE — Assessment & Plan Note (Signed)
Per chart review patient's daughter called to report that he is now following with [facility].

## 2021-01-28 ENCOUNTER — Other Ambulatory Visit (HOSPITAL_COMMUNITY): Payer: Self-pay

## 2021-01-28 NOTE — Telephone Encounter (Signed)
Called daughter Clarice Pole [phone] / pcs papers completed. Patient do not have medicaid.

## 2021-01-28 NOTE — Telephone Encounter (Signed)
Call to sister about assisting patient with his phone so he can use it with a personal  Continuous glucose monitor. She recommended I call Lucita Ferrara, daughter's mother [phone]

## 2021-01-29 NOTE — Progress Notes (Signed)
Internal Medicine Clinic Attending ° °Case discussed with Dr. Rehman  At the time of the visit.  We reviewed the resident’s history and exam and pertinent patient test results.  I agree with the assessment, diagnosis, and plan of care documented in the resident’s note.  ° °

## 2021-02-02 ENCOUNTER — Other Ambulatory Visit (HOSPITAL_COMMUNITY): Payer: Self-pay

## 2021-02-02 ENCOUNTER — Telehealth: Payer: Self-pay | Admitting: *Deleted

## 2021-02-02 NOTE — Addendum Note (Signed)
Addended by: Mike Craze on: 02/02/2021 10:55 AM   Modules accepted: Orders

## 2021-02-02 NOTE — Telephone Encounter (Signed)
Patient called in stating he needs refills on both insulins. Patient has refills at [facility]. Sister states she just left there and was told they did not have refills. Spoke with Margreta Journey at [facility]. States she has them and will get them ready now. She will send patient a text message when they are ready. Patient notified.

## 2021-02-03 ENCOUNTER — Telehealth: Payer: Self-pay | Admitting: Dietician

## 2021-02-03 ENCOUNTER — Other Ambulatory Visit: Payer: Self-pay

## 2021-02-03 ENCOUNTER — Encounter: Payer: Self-pay | Admitting: Dietician

## 2021-02-03 ENCOUNTER — Other Ambulatory Visit (HOSPITAL_COMMUNITY): Payer: Self-pay

## 2021-02-03 ENCOUNTER — Ambulatory Visit (INDEPENDENT_AMBULATORY_CARE_PROVIDER_SITE_OTHER): Payer: Commercial Managed Care - HMO | Admitting: Internal Medicine

## 2021-02-03 ENCOUNTER — Encounter: Payer: Self-pay | Admitting: Internal Medicine

## 2021-02-03 ENCOUNTER — Ambulatory Visit: Payer: Commercial Managed Care - HMO | Admitting: Dietician

## 2021-02-03 VITALS — BP 179/81 | HR 84 | Temp 98.2°F | Resp 28 | Ht 72.0 in | Wt 174.6 lb

## 2021-02-03 DIAGNOSIS — E1151 Type 2 diabetes mellitus with diabetic peripheral angiopathy without gangrene: Secondary | ICD-10-CM | POA: Diagnosis not present

## 2021-02-03 DIAGNOSIS — R4189 Other symptoms and signs involving cognitive functions and awareness: Secondary | ICD-10-CM | POA: Diagnosis not present

## 2021-02-03 DIAGNOSIS — E11319 Type 2 diabetes mellitus with unspecified diabetic retinopathy without macular edema: Secondary | ICD-10-CM | POA: Diagnosis not present

## 2021-02-03 DIAGNOSIS — I10 Essential (primary) hypertension: Secondary | ICD-10-CM

## 2021-02-03 DIAGNOSIS — I739 Peripheral vascular disease, unspecified: Secondary | ICD-10-CM

## 2021-02-03 DIAGNOSIS — G3184 Mild cognitive impairment, so stated: Secondary | ICD-10-CM | POA: Insufficient documentation

## 2021-02-03 DIAGNOSIS — E1122 Type 2 diabetes mellitus with diabetic chronic kidney disease: Secondary | ICD-10-CM | POA: Diagnosis not present

## 2021-02-03 DIAGNOSIS — Z89432 Acquired absence of left foot: Secondary | ICD-10-CM

## 2021-02-03 DIAGNOSIS — N184 Chronic kidney disease, stage 4 (severe): Secondary | ICD-10-CM | POA: Diagnosis not present

## 2021-02-03 DIAGNOSIS — I129 Hypertensive chronic kidney disease with stage 1 through stage 4 chronic kidney disease, or unspecified chronic kidney disease: Secondary | ICD-10-CM | POA: Diagnosis not present

## 2021-02-03 DIAGNOSIS — R39198 Other difficulties with micturition: Secondary | ICD-10-CM | POA: Diagnosis not present

## 2021-02-03 DIAGNOSIS — Z794 Long term (current) use of insulin: Secondary | ICD-10-CM

## 2021-02-03 DIAGNOSIS — E1159 Type 2 diabetes mellitus with other circulatory complications: Secondary | ICD-10-CM | POA: Diagnosis not present

## 2021-02-03 NOTE — Progress Notes (Signed)
°    Diabetes Self-Management Education  Visit Type: Follow-up  Appt. Start Time: 1435p Appt. End Time: 1450p  02/03/2021  [name], identified by name and date of birth, is a 61 y.o. male with a diagnosis of Diabetes:  .   ASSESSMENT Working to quit smoking. Smokes about 1 pack per week. Freestyle Libre Pro Continuous glucose monitoring downloaded today with 9% in target and 91% >180mg /dl Says he now is taking insulin BEFORE eating  States he has been out of lantus.this past week, and usually takes 21 units   BP Readings from Last 3 Encounters:  02/03/21 (!) 179/81  01/27/21 138/75  10/31/20 (!) 178/94     Diabetes Self-Management Education - 02/03/21 1600       Patient Self-Evaluation of Goals - Patient rates self as meeting previously set goals (% of time)   Monitoring >75%      Outcomes   Expected Outcomes Demonstrated interest in learning. Expect positive outcomes    Future DMSE Other (comment)   1 week   Program Status Completed              Individualized Plan for Diabetes Self-Management Training:   Learning Objective:  Patient will have a greater understanding of diabetes self-management. Patient education plan is to attend individual and/or group sessions per assessed needs and concerns.   Plan:   Patient Instructions  [name],   Please make a follow up for 1 week to have the sensor removed and downloaded.   I plan to request that Freestyle Libre  sensors be sent to your pharmacy.   We recommend for your kidneys and blood sugars :  Eating more beans, fish (tuna, salmon patties) and chicken,  fruits like the bananas, strawberries, soft melon, and a few servings of vegetables daily.  The best drinks are water, tea (sweetened with non nutritive sweetener like splenda or just a little sugar), coffee and milk.   Insulin-  please change your pen needle with each injection. You soul be using 4 pen needles every day.  I suggest you put your  smaller pen needle cap in your pill box to help you remember that you gave your Novolog injections.  Please make a follow up in 2-3 months with Debera Lat, diabetes educator   [name] [phone]   Thank you, [name] for allowing [place] to provide your care at the [facility] today. Today we discussed:  Diabetes: - Your insulin is waiting for you at the outpatient pharmacy. If you have any issues with obtaining your medications in the future let us know. We will check your glucose monitor in 1 week.  Vascular: - Please call vascular surgery about your leg pain. I have included the number here: [phone]   Follow up:  1 week    Remember:  Should you have any questions or concerns please call the internal medicine clinic at [phone].     Expected Outcomes:  Demonstrated interest in learning. Expect positive outcomes  Education material provided: Diabetes Resources  If problems or questions, patient to contact team via:  Phone  Future DSME appointment: Other (comment) (1 week)1 week Debera Lat, RD 02/03/2021 4:56 PM.

## 2021-02-03 NOTE — Assessment & Plan Note (Addendum)
BP 179/81 today. Patient reports he was unable to get his medications from pharmacy and has not been taking them. I called the pharmacy during his appointment today who notes that he has sufficient refills and medications are sitting at the pharmacy, ready for pickup. I suspect that his cognitive impairment (see separate problem) is playing a role medication adherence. [name] spoke with his sister today who said that she is going to pick them up after his appointment today.  - BP check in 1 week.

## 2021-02-03 NOTE — Telephone Encounter (Signed)
Patient's family(daughter's mother Delois) member left a message that they find that patient does not remember things and they would like his primary care doctor to know and test him for early dementia.

## 2021-02-03 NOTE — Progress Notes (Addendum)
INTERNAL MEDICINE CENTER PROGRESS NOTE Subjective:     Patient ID: [name], male   DOB: [birth year], 61 y.o.   MRN: [id number]  [name] is a 61 y/o presenting to clinic for CGM check. He reports that he was unable to get his insulin since last visit, and that both his daughter and ex-partner were told by the pharmacy that they did not have any medications for him. Since last visit he has not taken any insulin. He endorses blurry vision as well as numbness in his hands and legs. He says he is unable to write or button up his shirt due to decreased sensation in his hands. He also reports leg pain in his left leg that occurs at rest and with motion.  He denies polyuria, but says it can sometimes be difficult to urinate and it can take a while. He denies nausea, vomiting, fever, night sweats, abdominal pain, or dizziness.    Review of Systems  Constitutional:  Negative for diaphoresis and fever.  Eyes:  Positive for visual disturbance.  Gastrointestinal:  Negative for abdominal pain, nausea and vomiting.  Endocrine: Negative for polydipsia and polyuria.  Genitourinary:  Positive for difficulty urinating.  Musculoskeletal:        LLE pain  Neurological:  Negative for dizziness.      Objective:   Blood pressure (!) 179/81, pulse 84, temperature 98.2 F (36.8 C), temperature source Oral, resp. rate (!) 28, height 6' (1.829 m), weight 174 lb 9.6 oz (79.2 kg), SpO2 100 %.  General: chronically ill appearing, appears older than stated age Cardiac: RRR Pulm: breathing comfortably on room air, lungs clear GI: abd soft, non-tender, non-distended. 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 LLE is warm, no ulcerations or wounds. Able to plantar flex/dorsiflex left ankle. No pain on palpation of LLE. Hyperkeratosis of the foot. Loss of sensation of the LLE extending to mid calf.       Assessment & Plan:      PVD (peripheral vascular  disease) ([place]) (Chronic)    Today he reports pain at rest and with movement in left lower leg, Rutherford stage 4, Fontaine stage 3 symptoms. Unfortunately, he is unable to state how long this has been going on, and his sister, who sounds like is his primary caregiver, is not present at the visit today. Most recent ABI in 10/22 showed ABI of 0.49 in left leg, indicating severe peripheral arterial disease. Seen by vascular surgery last October, at which time he was not having pain at rest, and he was instructed to return if he develops this or develops ulceration.  No ulceration or wounds present on the left foot or lower leg and foot is still warm on exam however he is experiencing numbness extending to mid calf.   -Provided written and verbal instruction on the importance of reaching out to the vascular clinic to arrange close follow up      Essential hypertension    BP 179/81 today. Patient reports he was unable to get his medications from pharmacy and has not been taking them. I called the pharmacy during his appointment today who notes that he has sufficient refills and medications are sitting at the pharmacy, ready for pickup. I suspect that his cognitive impairment (see separate problem) is playing a role medication adherence. [name] spoke with his sister today who said that she is going to pick them up after his appointment today.  - BP check in 1 week.  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 CGM interrogated today shows that his baseline sugars are running ~250-300. He reports that he was unable to pick up his insulin from the pharmacy after his most recent visit. I called the pharmacy during his visit who notes that he has sufficient refills and medications are ready for pick up. Suspect cognitive impairment is a major contributor.  Upon meeting with  [name], he was able to demonstrate appropriate technique of insulin administration. Since he has not been taking any insulin, we will defer insulin adjustments today.  - Insulin glargine 22u nightly - Insulin aspart 11u 3x daily with meals. - Follow-up in 1 week for CGM. - Known history of diabetic retinopathy, needs referral to ophthalmology       Mild cognitive impairment with memory loss    Mild cognitive impairment of unclear etiology--MOCA score 19. Limited MOCA due to inability to hold a pen/write as a result of neuropathy, but scored 19/26 with 0/5 on memory recall. No prior workup--ddx is broad at this time. His memory loss likely affects disease management and should be taken into account in disease management.  - TSH, CBC, B12, CMP today - Will need ongoing workup and evaluation at next office visit. - Home health PT/OT/RN may be a consideration to discuss at his next visit to assist with strengthening, home safety and medication management.       Difficulty urinating    Patient reports some difficulty urinating at today's visit. He says he has to sit down and wait a while to urinate. It is unclear if this more representative of BPH vs. neurogenic bladder but this warrants further work-up at next visit, and consider possible urology referral. - Follow-up on urinary difficulty at next visit, including potential urology referral. - Consider bladder scan      Addendum 02/04/21 5:06 PM:  Slight worsening of his renal function on labs from 1/31 visit. No major electrolyte disturbances although K is at the upper limit of normal--5.2. There may be some benefit in starting oral bicarb for him however cognitive and medication adherence issues will be barriers.  He has been referred to nephrology in September. Review of that referral suggests that it was rejected due to multiple no shows.  There is a note in the chart from Dr. Laural Golden from our office saying that the daughter said he is  following with [facility]. It does not appear that we have received any notes from their office under the media tab. -Unfortunately, I have been unable to contact pt's family to discuss his labs and nephrology referral. Pt's current level of cognition is not sufficient enough to relay this to. We will need to repeat a BMP when he follows up in one week to re-evaluate his potassium and renal function.    Follow up in 1 week  CGM week 2 follow up, Blood pressure follow up Repeat BMP to follow up K 5.2 Ensure medications have been picked up from pharmacy Would consider weekly/bi-monthly clinic visits for at least a few weeks due to the complicity and degree of his chronic medical complaints. Recommend ensuring that family member is present at future visits. Would consider having someone be designated as primary decision maker Follow up labs from 1/31 visit for cognitive impairment Consider [facility] referral as noted above Ensure appointment with vascular  surgery has been arranged Follow up LUTS  Patient Discussed with Dr. Marty Heck, MD Internal Medicine Resident PGY-3 [name] 02/04/2021 6:02 AM

## 2021-02-03 NOTE — Patient Instructions (Signed)
Thank you, [name] for allowing [place] to provide your care today. Today we discussed:  Diabetes: Your insulin is waiting for you at the outpatient pharmacy. We will see you back in 1 week for a glucose monitor check.  Leg Pain: Please see Vascular Surgery regarding your leg pain. I have included their number here for you to call: [phone]  Follow up:  1 week    Remember: Should you have any questions or concerns please call the internal medicine clinic at [phone].

## 2021-02-03 NOTE — Telephone Encounter (Signed)
I spoke with patient's family ( daughter's mother) who can help him obtain access to his [employer] to be able to use his phone with a personal CGM. She did not know about his appointment today, but said she would have him here at 215 PM

## 2021-02-03 NOTE — Patient Instructions (Addendum)
[name],   Please make a follow up for 1 week to have the sensor removed and downloaded.   I plan to request that Freestyle Libre  sensors be sent to your pharmacy.   We recommend for your kidneys and blood sugars :  Eating more beans, fish (tuna, salmon patties) and chicken,  fruits like the bananas, strawberries, soft melon, and a few servings of vegetables daily.  The best drinks are water, tea (sweetened with non nutritive sweetener like splenda or just a little sugar), coffee and milk.   Insulin-  please change your pen needle with each injection. You soul be using 4 pen needles every day.  I suggest you put your smaller pen needle cap in your pill box to help you remember that you gave your Novolog injections.  Please make a follow up in 2-3 months with Debera Lat, diabetes educator   [name] [phone]

## 2021-02-03 NOTE — Assessment & Plan Note (Addendum)
A1c at last visit 1/24 was 11.9 and pt reported he had not taken insulin for several weeks. CGM interrogated today shows that his baseline sugars are running ~250-300. He reports that he was unable to pick up his insulin from the pharmacy after his most recent visit. I called the pharmacy during his visit who notes that he has sufficient refills and medications are ready for pick up. Suspect cognitive impairment is a major contributor.  Upon meeting with [name], he was able to demonstrate appropriate technique of insulin administration. Since he has not been taking any insulin, we will defer insulin adjustments today.  - Insulin glargine 22u nightly - Insulin aspart 11u 3x daily with meals. - Follow-up in 1 week for CGM.

## 2021-02-03 NOTE — Assessment & Plan Note (Signed)
Patient reports some difficulty urinating at today's visit. He says he has to sit down and wait a while to urinate. It is unclear if this more representative of BPH vs. neurogenic bladder but this warrants further work-up at next visit, and consider possible urology referral.  Plan: - Follow-up on urinary difficulty at next visit, including potential urology referral.

## 2021-02-03 NOTE — Assessment & Plan Note (Addendum)
Mild cognitive impairment of unclear etiology--MOCA score 19. Limited MOCA due to inability to hold a pen/write as a result of neuropathy, but scored 19/26 with 0/5 on memory recall. No prior workup--ddx is broad at this time. His memory loss likely affects disease management and should be taken into account in disease management.  - TSH, CBC, B12, CMP today - Will need ongoing workup and evaluation at next office visit. - Home health PT/OT/RN may be a consideration to discuss at his next visit to assist with strengthening, home safety and medication management.

## 2021-02-03 NOTE — Assessment & Plan Note (Deleted)
Today he reports pain at rest and with movement. Most recent ABI in 10/22 showed ABI of 0.49 in left leg, indicating severe peripheral arterial disease. Instructed him to follow-up with vascular surgery regarding leg pain and provided him the number to call.  Plan: - Follow-up with vascular surgery. Number provided at today's visit.

## 2021-02-03 NOTE — Assessment & Plan Note (Addendum)
Today he reports pain at rest and with movement in left lower leg, Rutherford stage 4, Fontaine stage 3 symptoms. Unfortunately, he is unable to state how long this has been going on, and his sister, who sounds like is his primary caregiver, is not present at the visit today. Most recent ABI in 10/22 showed ABI of 0.49 in left leg, indicating severe peripheral arterial disease. Seen by vascular surgery last October, at which time he was not having pain at rest, and he was instructed to return if he develops this or develops ulceration.  No ulceration or wounds present on the left foot or lower leg and foot is still warm on exam however he is experiencing numbness extending to mid calf.   -Provided written and verbal instruction on the importance of reaching out to the vascular clinic to arrange close follow up

## 2021-02-04 ENCOUNTER — Telehealth: Payer: Self-pay | Admitting: *Deleted

## 2021-02-04 ENCOUNTER — Other Ambulatory Visit (HOSPITAL_COMMUNITY): Payer: Self-pay

## 2021-02-04 ENCOUNTER — Other Ambulatory Visit: Payer: Self-pay | Admitting: Dietician

## 2021-02-04 ENCOUNTER — Encounter: Payer: Self-pay | Admitting: Internal Medicine

## 2021-02-04 DIAGNOSIS — E1159 Type 2 diabetes mellitus with other circulatory complications: Secondary | ICD-10-CM

## 2021-02-04 LAB — CBC WITH DIFFERENTIAL/PLATELET
Basophils Absolute: 0 10*3/uL (ref 0.0–0.2)
Basos: 0 %
EOS (ABSOLUTE): 0.2 10*3/uL (ref 0.0–0.4)
Eos: 3 %
Hematocrit: 36.4 % — ABNORMAL LOW (ref 37.5–51.0)
Hemoglobin: 12.2 g/dL — ABNORMAL LOW (ref 13.0–17.7)
Immature Grans (Abs): 0 10*3/uL (ref 0.0–0.1)
Immature Granulocytes: 0 %
Lymphocytes Absolute: 1.7 10*3/uL (ref 0.7–3.1)
Lymphs: 19 %
MCH: 29.4 pg (ref 26.6–33.0)
MCHC: 33.5 g/dL (ref 31.5–35.7)
MCV: 88 fL (ref 79–97)
Monocytes Absolute: 0.5 10*3/uL (ref 0.1–0.9)
Monocytes: 6 %
Neutrophils Absolute: 6.6 10*3/uL (ref 1.4–7.0)
Neutrophils: 72 %
Platelets: 249 10*3/uL (ref 150–450)
RBC: 4.15 x10E6/uL (ref 4.14–5.80)
RDW: 13.3 % (ref 11.6–15.4)
WBC: 9.1 10*3/uL (ref 3.4–10.8)

## 2021-02-04 LAB — CMP14 + ANION GAP
ALT: 7 IU/L (ref 0–44)
AST: 12 IU/L (ref 0–40)
Albumin/Globulin Ratio: 1.3 (ref 1.2–2.2)
Albumin: 4 g/dL (ref 3.8–4.8)
Alkaline Phosphatase: 87 IU/L (ref 44–121)
Anion Gap: 17 mmol/L (ref 10.0–18.0)
BUN/Creatinine Ratio: 11 (ref 10–24)
BUN: 43 mg/dL — ABNORMAL HIGH (ref 8–27)
Bilirubin Total: 0.4 mg/dL (ref 0.0–1.2)
CO2: 18 mmol/L — ABNORMAL LOW (ref 20–29)
Calcium: 9.1 mg/dL (ref 8.6–10.2)
Chloride: 99 mmol/L (ref 96–106)
Creatinine, Ser: 3.93 mg/dL — ABNORMAL HIGH (ref 0.76–1.27)
Globulin, Total: 3.1 g/dL (ref 1.5–4.5)
Glucose: 219 mg/dL — ABNORMAL HIGH (ref 70–99)
Potassium: 5.2 mmol/L (ref 3.5–5.2)
Sodium: 134 mmol/L (ref 134–144)
Total Protein: 7.1 g/dL (ref 6.0–8.5)
eGFR: 17 mL/min/{1.73_m2} — ABNORMAL LOW (ref >59)

## 2021-02-04 LAB — TSH: TSH: 1.54 u[IU]/mL (ref 0.450–4.500)

## 2021-02-04 LAB — VITAMIN B12: Vitamin B-12: 309 pg/mL (ref 232–1245)

## 2021-02-04 NOTE — Telephone Encounter (Signed)
Patient requests prescription for freestyle libre 3 [facility].

## 2021-02-04 NOTE — Assessment & Plan Note (Signed)
Slight worsening of his renal function on labs from 1/31 visit. No major electrolyte disturbances although K is at the upper limit of normal--5.2. There may be some benefit in starting oral bicarb for him however cognitive and medication adherence issues will be barriers.  He has been referred to nephrology in September. Review of that referral suggests that it was rejected due to multiple no shows.  There is a note in the chart from Dr. Laural Golden from our office saying that the daughter said he is following with [facility]. It does not appear that we have received any notes from their office under the media tab. -Unfortunately, I have been unable to contact pt's family to discuss his labs and nephrology referral. Pt's current level of cognition is not sufficient enough to relay this to. We will need to repeat a BMP when he follows up in one week to re-evaluate his potassium and renal function.

## 2021-02-04 NOTE — Progress Notes (Signed)
Unable to contact pt's family to discuss lab results. Will need to be followed up at [facility].

## 2021-02-04 NOTE — Telephone Encounter (Signed)
RETURNED [place] , NO ONE ANSWERED PHONE.  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

## 2021-02-05 ENCOUNTER — Other Ambulatory Visit (HOSPITAL_COMMUNITY): Payer: Self-pay

## 2021-02-05 ENCOUNTER — Other Ambulatory Visit: Payer: Self-pay | Admitting: Student

## 2021-02-05 MED ORDER — FREESTYLE LIBRE 2 SENSOR MISC
1.0000 | Freq: Every day | 3 refills | Status: DC
  Filled 2021-02-05: qty 1, 14d supply, fill #0
  Filled 2021-02-16: qty 2, 28d supply, fill #0
  Filled 2021-02-16: qty 1, 14d supply, fill #0

## 2021-02-05 MED ORDER — FREESTYLE LIBRE 3 SENSOR MISC
1.0000 | Freq: Every day | 11 refills | Status: DC
  Filled 2021-02-05: qty 2, 28d supply, fill #0

## 2021-02-05 NOTE — Progress Notes (Signed)
Internal Medicine Clinic Attending  Case discussed with Dr. Darrick Meigs  At the time of the visit.  We reviewed the residents history and exam and pertinent patient test results.  I agree with the assessment, diagnosis, and plan of care documented in the residents note. He would be an excellent PACE candidate given health literacy, cognitive, and other barriers in setting of serious multimorbidity requiring frequent f/u.

## 2021-02-06 ENCOUNTER — Other Ambulatory Visit (HOSPITAL_COMMUNITY): Payer: Self-pay

## 2021-02-10 ENCOUNTER — Ambulatory Visit
Admission: RE | Admit: 2021-02-10 | Discharge: 2021-02-10 | Disposition: A | Discharge status: Home / Self Care | Payer: Medicare Other | Source: Ambulatory Visit | Attending: Nephrology | Admitting: Nephrology

## 2021-02-10 DIAGNOSIS — N184 Chronic kidney disease, stage 4 (severe): Secondary | ICD-10-CM

## 2021-02-11 ENCOUNTER — Encounter: Payer: Commercial Managed Care - HMO | Admitting: Internal Medicine

## 2021-02-11 ENCOUNTER — Telehealth: Payer: Self-pay

## 2021-02-11 NOTE — Telephone Encounter (Signed)
° °  Telephone encounter was:  Unsuccessful.  02/11/2021 Name: [name] MRN: [id number] DOB: 07-19-1960  Unsuccessful outbound call made today to assist with:  Transportation Needs  and Financial Difficulties related to bills  Outreach Attempt:  1st Attempt  A HIPAA compliant voice message was left requesting a return call.  Instructed patient to call back at [phone] at their earliest convenience.  [place] management  [place], [place] and [place]  Main Phone: [phone]   E-mail: [email or web address]  Website: [email or web address]

## 2021-02-12 ENCOUNTER — Telehealth: Payer: Self-pay

## 2021-02-12 NOTE — Telephone Encounter (Signed)
° °  Telephone encounter was:  Unsuccessful.  02/12/2021 Name: [name] MRN: [id number] DOB: May 19, 1960  Unsuccessful outbound call made today to assist with:  Transportation Needs  and Financial Difficulties related to bills  Outreach Attempt:  2nd Attempt  A HIPAA compliant voice message was left requesting a return call.  Instructed patient to call back at [phone] at their earliest convenience.  Unable to reach [name] on Mobile, however, per phone system SMS message was sent I called his home phone and Sister [name] advised to contact [phone] [facility]. I contacted Delores and left her a message as well.  [place] management  [place], [place]  Main Phone: [phone]   E-mail: [email or web address]  Website: [email or web address]

## 2021-02-13 ENCOUNTER — Other Ambulatory Visit (HOSPITAL_COMMUNITY): Payer: Self-pay

## 2021-02-16 ENCOUNTER — Other Ambulatory Visit (HOSPITAL_COMMUNITY): Payer: Self-pay

## 2021-02-18 ENCOUNTER — Other Ambulatory Visit: Payer: Self-pay | Admitting: Internal Medicine

## 2021-02-18 ENCOUNTER — Other Ambulatory Visit: Payer: Self-pay

## 2021-02-18 DIAGNOSIS — R053 Chronic cough: Secondary | ICD-10-CM

## 2021-02-18 MED ORDER — ALBUTEROL SULFATE HFA 108 (90 BASE) MCG/ACT IN AERS
INHALATION_SPRAY | RESPIRATORY_TRACT | 1 refills | Status: DC
  Filled 2021-02-18: qty 18, 25d supply, fill #0

## 2021-02-20 ENCOUNTER — Telehealth: Payer: Self-pay

## 2021-02-20 NOTE — Telephone Encounter (Signed)
° °  Telephone encounter was:  Successful.  02/20/2021 Name: [name] MRN: [id number] DOB: Dec 06, 1960  [name] is a 61 y.o. year old male who is a primary care patient of Katsadouros, Candace Gallus, MD . The community resource team was consulted for assistance with Transportation Needs   Care guide performed the following interventions:  Caretaker Delores called in advising pt does not need transportation as he using insurance. She stated pt has a doctors appointment coming up and they are trying to get in-home care. I asked if they were interested in a list and she stated no.  She also stated food services may be needed but at this time she will say no and call back if services are needed. Referral services declined.  Follow Up Plan:  No further follow up planned at this time. The patient has been provided with needed resources.  [place] management  [place], [place]  Main Phone: [phone]   E-mail: [email or web address]  Website: [email or web address]

## 2021-02-25 ENCOUNTER — Other Ambulatory Visit: Payer: Self-pay

## 2021-03-02 ENCOUNTER — Ambulatory Visit (INDEPENDENT_AMBULATORY_CARE_PROVIDER_SITE_OTHER): Payer: Medicare Other | Admitting: Internal Medicine

## 2021-03-02 ENCOUNTER — Encounter: Payer: Self-pay | Admitting: Internal Medicine

## 2021-03-02 VITALS — BP 164/82 | HR 91 | Temp 99.4°F | Resp 24 | Ht 72.0 in | Wt 180.3 lb

## 2021-03-02 DIAGNOSIS — E1151 Type 2 diabetes mellitus with diabetic peripheral angiopathy without gangrene: Secondary | ICD-10-CM

## 2021-03-02 DIAGNOSIS — E1159 Type 2 diabetes mellitus with other circulatory complications: Secondary | ICD-10-CM | POA: Diagnosis not present

## 2021-03-02 DIAGNOSIS — D122 Benign neoplasm of ascending colon: Secondary | ICD-10-CM

## 2021-03-02 DIAGNOSIS — E782 Mixed hyperlipidemia: Secondary | ICD-10-CM | POA: Diagnosis not present

## 2021-03-02 DIAGNOSIS — I10 Essential (primary) hypertension: Secondary | ICD-10-CM | POA: Diagnosis not present

## 2021-03-02 DIAGNOSIS — E1122 Type 2 diabetes mellitus with diabetic chronic kidney disease: Secondary | ICD-10-CM

## 2021-03-02 DIAGNOSIS — N184 Chronic kidney disease, stage 4 (severe): Secondary | ICD-10-CM

## 2021-03-02 DIAGNOSIS — I129 Hypertensive chronic kidney disease with stage 1 through stage 4 chronic kidney disease, or unspecified chronic kidney disease: Secondary | ICD-10-CM

## 2021-03-02 DIAGNOSIS — G3184 Mild cognitive impairment, so stated: Secondary | ICD-10-CM

## 2021-03-02 DIAGNOSIS — I739 Peripheral vascular disease, unspecified: Secondary | ICD-10-CM

## 2021-03-02 NOTE — Patient Instructions (Addendum)
Mr [name],  It was a pleasure seeing you in clinic. Today we discussed:   Leg pain: Follow up with the vascular surgeon  Home support: I will place orders for home services. Please consider [facility] for your complete care.   I am checking some lab work. I will call you with any abnormal results   If you have any questions or concerns, please call our clinic at [phone] between 9am-5pm and after hours call [phone] and ask for the internal medicine resident on call. If you feel you are having a medical emergency please call 911.   Thank you, we look forward to helping you remain healthy!

## 2021-03-02 NOTE — Progress Notes (Signed)
° °  CC: memory impairment follow up   HPI:  [name] is a 61 y.o. male with PMHx as stated below presenting for follow up of ongoing cognitive impairment with concerns for medication noncompliance as a result. Please see problem based charting for complete assessment and plan.  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 tibialis and PTA of L. ant and post. tibial vessels 12/31/2011   SOB (shortness of breath)    uses inhaler prn   Type II diabetes mellitus ([place]) ~ 2002   Review of Systems:  Negative except as stated in HPI.  Physical Exam:  Vitals:   03/02/21 1531 03/02/21 1538  BP: (!) 167/92 (!) 164/82  Pulse: 91 91  Resp: (!) 24   Temp: 99.4 F (37.4 C)   TempSrc: Oral   SpO2: 100%   Weight: 180 lb 4.8 oz (81.8 kg)   Height: 6' (1.829 m)    Physical Exam  Constitutional: Generally well appearing male, no acute distress  Cardiovascular: Normal rate, regular rhythm, S1 and S2 present, no murmurs, rubs, gallops.  Respiratory: No respiratory distress,  lungs are clear to auscultation bilaterally. Musculoskeletal: Normal bulk and tone.  No peripheral edema noted. Neurological: Is alert and oriented x4, no apparent focal deficits noted Skin: Warm, dry skin with excoriations of bilateral lower extremities; No rash,  erythema, lesions noted. Psychiatric: Normal mood and affect.  Assessment & Plan:   See Encounters Tab for problem based charting.  Patient discussed with Dr. Daryll Drown

## 2021-03-03 DIAGNOSIS — K635 Polyp of colon: Secondary | ICD-10-CM | POA: Insufficient documentation

## 2021-03-03 LAB — LIPID PANEL

## 2021-03-03 NOTE — Assessment & Plan Note (Signed)
Patient was evaluated at last visit for concerns of cognitive impairment and memory loss when he was noted to have [place] score of 19/26 with 0/5 on memory recall. Work up including TSH, CBC, B12, and CMP with mild normocytic anemia. CBC repeated at this visit with worsening anemia without obvious signs of bleeding noted. B12 wnl.  Can pursue follow up iron studies at next visit. Patient has not had any changes in bowels; however, did have colonoscopy in 2020 that noted sessile and pedunculated polyps and patient was recommended for follow up colonoscopy in 1 year; however, has not been able to complete.  Patient's cognitive impairment has affected his chronic medical illnesses - he is unable to care for himself at this time and is dependent on family members for assistance with finances, medication management, and household chores.  Family is requesting home health PCS services. Discussed that we can fill out paperwork for this, although unlikely without medicaid assistance. Given that he has good support system and requires additional care with medication assistance and management of chronic illnesses in setting of cognitive decline, patient would be good candidate for [facility].  Discussed this with family and offered brochure. Discussed with [facility]ACE representative who will reach out to family.

## 2021-03-03 NOTE — Assessment & Plan Note (Signed)
Worsening renal function at last visit without major electrolyte disturbances noted. Patient previously referred to nephrologist but rejected due to multiple no shows. Patient's family reports that he was able to follow up with [facility] and was recommended for continued monitoring. I am unable to see any note from them in our chart at this time. Will repeat BMP at this visit to monitor renal function

## 2021-03-03 NOTE — Assessment & Plan Note (Signed)
Patient noted to have adenomatous sessile and pedunculated polyps on colonoscopy in 2020. He did not have good bowel prep and was recommended for repeat colonoscopy in one year with good bowel prep.  He is noted to have normocytic anemia on CBC with Hb 12>10 at this visit. He is asymptomatic and denies any signs of bleeding.   Plan: Iron studies at next visit If iron deficiency present, will need follow up with repeat colonoscopy

## 2021-03-03 NOTE — Assessment & Plan Note (Signed)
Patient with most recent A1c of 11.9 with concerns regarding medication adherence. He does not have his CGM in place at this time; however, does note that heh as been taking his insulin as prescribed with glargine 22U nightly and Aspart 11U tid with meals. Has not been checking his blood glucose at home.   Plan: Follow up with [name] for CGM placement Continue current regimen Follow up in 1 week after CGM placement for insulin adjustment

## 2021-03-03 NOTE — Assessment & Plan Note (Signed)
BP Readings from Last 3 Encounters:  03/02/21 (!) 164/82  02/03/21 (!) 179/81  01/27/21 138/75   Patient's blood pressure slightly improved from prior visit but remains above goal. He is asymptomatic at this time.There is concern for medication compliance. His significant other helps with medication management; however, he does not always take his medications. There have been concerns about cognitive impairment contributing to medication adherence.   Plan: Patient encouraged to take his antihypertensives as prescribed  BMP at this visit

## 2021-03-03 NOTE — Assessment & Plan Note (Addendum)
Patient endorses ongoing bilateral leg pain with movement, worse in the left lower extremity. He expressed this at his last visit and was advised to follow up with vascular surgery in setting of severe peripheral artery disease. No ulceration or wounds noted of left leg.  Patient advised to follow up with vascular surgery in light of progressively worsening lower extremity pain.   Plan: Patient advised to follow up with vascular surgery

## 2021-03-05 ENCOUNTER — Telehealth: Payer: Self-pay | Admitting: *Deleted

## 2021-03-06 ENCOUNTER — Other Ambulatory Visit: Payer: Self-pay | Admitting: Nephrology

## 2021-03-06 ENCOUNTER — Other Ambulatory Visit (HOSPITAL_COMMUNITY): Payer: Self-pay | Admitting: Nephrology

## 2021-03-06 DIAGNOSIS — R809 Proteinuria, unspecified: Secondary | ICD-10-CM

## 2021-03-06 LAB — CBC
Hematocrit: 32 % — ABNORMAL LOW (ref 37.5–51.0)
Hemoglobin: 10.9 g/dL — ABNORMAL LOW (ref 13.0–17.7)
MCH: 29.8 pg (ref 26.6–33.0)
MCHC: 34.1 g/dL (ref 31.5–35.7)
MCV: 87 fL (ref 79–97)
Platelets: 238 10*3/uL (ref 150–450)
RBC: 3.66 x10E6/uL — ABNORMAL LOW (ref 4.14–5.80)
RDW: 12.7 % (ref 11.6–15.4)
WBC: 8.2 10*3/uL (ref 3.4–10.8)

## 2021-03-06 LAB — LIPID PANEL
Chol/HDL Ratio: 4.1 ratio (ref 0.0–5.0)
Cholesterol, Total: 136 mg/dL (ref 100–199)
HDL: 33 mg/dL — ABNORMAL LOW (ref >39)
LDL Chol Calc (NIH): 93 mg/dL (ref 0–99)
Triglycerides: 44 mg/dL (ref 0–149)
VLDL Cholesterol Cal: 10 mg/dL (ref 5–40)

## 2021-03-06 LAB — BMP8+ANION GAP
Anion Gap: 16 mmol/L (ref 10.0–18.0)
BUN/Creatinine Ratio: 8 — ABNORMAL LOW (ref 10–24)
BUN: 33 mg/dL — ABNORMAL HIGH (ref 8–27)
CO2: 17 mmol/L — ABNORMAL LOW (ref 20–29)
Calcium: 8.5 mg/dL — ABNORMAL LOW (ref 8.6–10.2)
Chloride: 103 mmol/L (ref 96–106)
Creatinine, Ser: 4.13 mg/dL — ABNORMAL HIGH (ref 0.76–1.27)
Glucose: 164 mg/dL — ABNORMAL HIGH (ref 70–99)
Potassium: 5.2 mmol/L (ref 3.5–5.2)
Sodium: 136 mmol/L (ref 134–144)
eGFR: 16 mL/min/{1.73_m2} — ABNORMAL LOW (ref >59)

## 2021-03-10 NOTE — Telephone Encounter (Signed)
CALLED PATIENT LEFT VOICE MESSAGE FOR PATIENT TO RETURN CALL / REGARDING PCS/ HIS MEDICAID HE WILL HAVE TO PAY OUT OF POCKET. ?

## 2021-03-12 NOTE — Telephone Encounter (Signed)
CALLED PATIENT / LVM FOR PATIENT REGARDING HIS MEDICAID COVERAGE FOR PCS. HIS TYPE OF MEDICAID, HE WOULD HAVE TO PAY OUT OF POCKET FOR THIS TYPE COVERAGE. ?

## 2021-03-13 ENCOUNTER — Encounter: Payer: Self-pay | Admitting: Vascular Surgery

## 2021-03-16 NOTE — Progress Notes (Signed)
Internal Medicine Clinic Attending  Case discussed with Dr. Aslam at the time of the visit.  We reviewed the resident's history and exam and pertinent patient test results.  I agree with the assessment, diagnosis, and plan of care documented in the resident's note.  

## 2021-03-26 ENCOUNTER — Telehealth: Payer: Self-pay | Admitting: Dietician

## 2021-03-26 NOTE — Telephone Encounter (Signed)
Call to patient to follow up on removal of professional CGM sensor ?

## 2021-04-13 ENCOUNTER — Other Ambulatory Visit: Payer: Self-pay | Admitting: Internal Medicine

## 2021-04-14 ENCOUNTER — Ambulatory Visit (HOSPITAL_COMMUNITY): Admission: RE | Admit: 2021-04-14 | Payer: Medicare Other | Source: Ambulatory Visit

## 2021-04-14 ENCOUNTER — Encounter (HOSPITAL_COMMUNITY): Payer: Self-pay

## 2021-04-14 ENCOUNTER — Other Ambulatory Visit: Payer: Self-pay | Admitting: Radiology

## 2021-04-29 ENCOUNTER — Ambulatory Visit (HOSPITAL_COMMUNITY): Payer: Medicare Other

## 2021-05-05 ENCOUNTER — Encounter (HOSPITAL_COMMUNITY): Payer: Self-pay | Admitting: Radiology

## 2021-05-06 ENCOUNTER — Other Ambulatory Visit (HOSPITAL_COMMUNITY): Payer: Self-pay | Admitting: Physician Assistant

## 2021-05-07 ENCOUNTER — Other Ambulatory Visit: Payer: Self-pay | Admitting: Radiology

## 2021-05-08 ENCOUNTER — Other Ambulatory Visit (HOSPITAL_COMMUNITY): Payer: Self-pay

## 2021-05-08 ENCOUNTER — Ambulatory Visit (HOSPITAL_COMMUNITY)
Admission: RE | Admit: 2021-05-08 | Discharge: 2021-05-08 | Disposition: A | Discharge status: Home / Self Care | Payer: Medicare Other | Source: Ambulatory Visit | Attending: Nephrology | Admitting: Nephrology

## 2021-05-08 ENCOUNTER — Ambulatory Visit (HOSPITAL_COMMUNITY)
Admission: RE | Admit: 2021-05-08 | Discharge: 2021-05-08 | Disposition: A | Discharge status: Home / Self Care | Payer: Medicare Other | Source: Ambulatory Visit | Attending: Radiology | Admitting: Radiology

## 2021-05-08 ENCOUNTER — Other Ambulatory Visit: Payer: Self-pay

## 2021-05-08 DIAGNOSIS — I130 Hypertensive heart and chronic kidney disease with heart failure and stage 1 through stage 4 chronic kidney disease, or unspecified chronic kidney disease: Secondary | ICD-10-CM | POA: Diagnosis not present

## 2021-05-08 DIAGNOSIS — I509 Heart failure, unspecified: Secondary | ICD-10-CM | POA: Diagnosis not present

## 2021-05-08 DIAGNOSIS — E785 Hyperlipidemia, unspecified: Secondary | ICD-10-CM | POA: Diagnosis not present

## 2021-05-08 DIAGNOSIS — N184 Chronic kidney disease, stage 4 (severe): Secondary | ICD-10-CM | POA: Diagnosis not present

## 2021-05-08 DIAGNOSIS — R809 Proteinuria, unspecified: Secondary | ICD-10-CM | POA: Diagnosis present

## 2021-05-08 DIAGNOSIS — E119 Type 2 diabetes mellitus without complications: Secondary | ICD-10-CM | POA: Insufficient documentation

## 2021-05-08 DIAGNOSIS — N2889 Other specified disorders of kidney and ureter: Secondary | ICD-10-CM | POA: Diagnosis not present

## 2021-05-08 DIAGNOSIS — I739 Peripheral vascular disease, unspecified: Secondary | ICD-10-CM | POA: Diagnosis not present

## 2021-05-08 LAB — CBC WITH DIFFERENTIAL/PLATELET
Abs Immature Granulocytes: 0.04 10*3/uL (ref 0.00–0.07)
Basophils Absolute: 0 10*3/uL (ref 0.0–0.1)
Basophils Relative: 0 %
Eosinophils Absolute: 0.3 10*3/uL (ref 0.0–0.5)
Eosinophils Relative: 3 %
HCT: 36.3 % — ABNORMAL LOW (ref 39.0–52.0)
Hemoglobin: 11.9 g/dL — ABNORMAL LOW (ref 13.0–17.0)
Immature Granulocytes: 0 %
Lymphocytes Relative: 13 %
Lymphs Abs: 1.3 10*3/uL (ref 0.7–4.0)
MCH: 29.4 pg (ref 26.0–34.0)
MCHC: 32.8 g/dL (ref 30.0–36.0)
MCV: 89.6 fL (ref 80.0–100.0)
Monocytes Absolute: 0.6 10*3/uL (ref 0.1–1.0)
Monocytes Relative: 6 %
Neutro Abs: 7.6 10*3/uL (ref 1.7–7.7)
Neutrophils Relative %: 78 %
Platelets: 256 10*3/uL (ref 150–400)
RBC: 4.05 MIL/uL — ABNORMAL LOW (ref 4.22–5.81)
RDW: 12.7 % (ref 11.5–15.5)
WBC: 9.8 10*3/uL (ref 4.0–10.5)
nRBC: 0 % (ref 0.0–0.2)

## 2021-05-08 LAB — GLUCOSE, CAPILLARY
Glucose-Capillary: 164 mg/dL — ABNORMAL HIGH (ref 70–99)
Glucose-Capillary: 123 mg/dL — ABNORMAL HIGH (ref 70–99)

## 2021-05-08 LAB — BASIC METABOLIC PANEL
Anion gap: 7 (ref 5–15)
BUN: 30 mg/dL — ABNORMAL HIGH (ref 8–23)
CO2: 22 mmol/L (ref 22–32)
Calcium: 8 mg/dL — ABNORMAL LOW (ref 8.9–10.3)
Chloride: 104 mmol/L (ref 98–111)
Creatinine, Ser: 4.43 mg/dL — ABNORMAL HIGH (ref 0.61–1.24)
GFR, Estimated: 14 mL/min — ABNORMAL LOW (ref >60)
Glucose, Bld: 152 mg/dL — ABNORMAL HIGH (ref 70–99)
Potassium: 3.7 mmol/L (ref 3.5–5.1)
Sodium: 133 mmol/L — ABNORMAL LOW (ref 135–145)

## 2021-05-08 LAB — PROTIME-INR
INR: 1.1 (ref 0.8–1.2)
Prothrombin Time: 13.6 seconds (ref 11.4–15.2)

## 2021-05-08 MED ORDER — SODIUM CHLORIDE 0.9 % IV SOLN: INTRAVENOUS | Status: DC

## 2021-05-08 MED ORDER — FENTANYL CITRATE (PF) 100 MCG/2ML IJ SOLN
INTRAMUSCULAR | Status: AC
  Filled 2021-05-08: qty 4

## 2021-05-08 MED ORDER — FENTANYL CITRATE (PF) 100 MCG/2ML IJ SOLN
50.0000 ug | Freq: Once | INTRAMUSCULAR | Status: AC
  Administered 2021-05-08: 50 ug via INTRAVENOUS

## 2021-05-08 MED ORDER — HYDRALAZINE HCL 20 MG/ML IJ SOLN
5.0000 mg | Freq: Once | INTRAMUSCULAR | Status: AC
  Administered 2021-05-08: 5 mg via INTRAVENOUS

## 2021-05-08 MED ORDER — LIDOCAINE HCL (PF) 1 % IJ SOLN
INTRAMUSCULAR | Status: AC
  Filled 2021-05-08: qty 30

## 2021-05-08 MED ORDER — HYDRALAZINE HCL 20 MG/ML IJ SOLN
INTRAMUSCULAR | Status: AC
  Administered 2021-05-08: 10 mg via INTRAVENOUS
  Filled 2021-05-08: qty 1

## 2021-05-08 MED ORDER — MIDAZOLAM HCL 2 MG/2ML IJ SOLN
INTRAMUSCULAR | Status: AC
  Filled 2021-05-08: qty 4

## 2021-05-08 MED ORDER — HYDRALAZINE HCL 20 MG/ML IJ SOLN
INTRAMUSCULAR | Status: AC
  Filled 2021-05-08: qty 1

## 2021-05-08 MED ORDER — FENTANYL CITRATE (PF) 100 MCG/2ML IJ SOLN
INTRAMUSCULAR | Status: AC | PRN
  Administered 2021-05-08 (×2): 25 ug via INTRAVENOUS

## 2021-05-08 MED ORDER — HYDRALAZINE HCL 20 MG/ML IJ SOLN: 10.0000 mg | Freq: Once | INTRAMUSCULAR | Status: AC

## 2021-05-08 MED ORDER — OXYCODONE-ACETAMINOPHEN 5-325 MG PO TABS
1.0000 | ORAL_TABLET | Freq: Four times a day (QID) | ORAL | 0 refills | Status: DC | PRN
  Filled 2021-05-08: qty 15, 4d supply, fill #0

## 2021-05-08 MED ORDER — HYDROMORPHONE HCL 1 MG/ML IJ SOLN
1.0000 mg | Freq: Once | INTRAMUSCULAR | Status: AC
  Administered 2021-05-08: 1 mg via INTRAVENOUS
  Filled 2021-05-08: qty 1

## 2021-05-08 MED ORDER — GELATIN ABSORBABLE 12-7 MM EX MISC
CUTANEOUS | Status: AC
  Filled 2021-05-08: qty 1

## 2021-05-08 MED ORDER — MIDAZOLAM HCL 2 MG/2ML IJ SOLN
INTRAMUSCULAR | Status: AC | PRN
  Administered 2021-05-08: .5 mg via INTRAVENOUS
  Administered 2021-05-08: 1 mg via INTRAVENOUS

## 2021-05-08 MED ORDER — FENTANYL CITRATE (PF) 100 MCG/2ML IJ SOLN
INTRAMUSCULAR | Status: AC
  Filled 2021-05-08: qty 2

## 2021-05-08 MED ORDER — AMLODIPINE BESYLATE 10 MG PO TABS
10.0000 mg | ORAL_TABLET | Freq: Once | ORAL | Status: AC
  Administered 2021-05-08: 10 mg via ORAL
  Filled 2021-05-08: qty 1

## 2021-05-08 NOTE — Progress Notes (Signed)
Called to pt's room with pt c/o pain to left flank bandaid site clean, dry intact-Jennifer, PA notified ?

## 2021-05-08 NOTE — Progress Notes (Signed)
Went to check on patient at 1500 and woke him up to assess L Flank site. Patient stated that he was "terrible" and rated his pain as a 7/10. Althea Grimmer, [place] who stated that she will be prescribing pain medication for patient for home and to call back at 1530 when he is done with bedrest to update her of his status.  ?

## 2021-05-08 NOTE — Progress Notes (Signed)
Pt assisted with redress and up to w/c-awaiting sister for ride home ?
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
## 2021-05-08 NOTE — Progress Notes (Addendum)
Spoke with [name], [place] who stated that patient was ok to discharge.  ?

## 2021-05-08 NOTE — Progress Notes (Signed)
Anderson Malta, NP at bedside and gave verbal order for Fentanyl 50 mcg IV once. Orders placed and given.  ?

## 2021-05-08 NOTE — Progress Notes (Signed)
Pt returned from CT-pain is no better-Jennifer NP aware ?

## 2021-05-08 NOTE — Progress Notes (Signed)
Patient continues to be in significant pain upon return of CT. Anderson Malta, NP notified and stated that patient does have a small hematoma and they will continue to monitor him. Patient will not need to be admitted, but his bedrest time will be extended. Anderson Malta, NP will need to consult with attending and will call back. Spoke with patient and stated that Anderson Malta, NP has been called and if he could lay as still as possible. Patient verbalized understanding.  ?

## 2021-05-08 NOTE — Sedation Documentation (Signed)
Attempted to call SBAR to SS.  

## 2021-05-08 NOTE — Progress Notes (Addendum)
Patient reporting severe right flank pain post procedure. HR 105 BP 151/86. Patient seen at bedside reporting intense pain writhing back and forth. Stat non con abdomen CT  and 50 mg of Fentanyl ordered.  ? ?Addendum 12:06 CT shows small hematoma. Continue to observe patient until 1515. Pain rated 10/10. 1 mg of dilaudid ordered.  ?

## 2021-05-08 NOTE — Progress Notes (Signed)
Pt questioning "where are my cards" from cell phone case-at d/c to car-pt's sister's [name] states she has them ?

## 2021-05-08 NOTE — Procedures (Signed)
Interventional Radiology Procedure Note ? ?Procedure: [place] left renal bx   ? ?Complications: None ? ?Estimated Blood Loss:  min ? ?Findings: ?18 g core x 2   ? ?M. Daryll Brod, MD ? ? ? ?

## 2021-05-08 NOTE — Progress Notes (Signed)
[name], [place] aware of elevated BP-pending orders ?

## 2021-05-11 LAB — SURGICAL PATHOLOGY

## 2021-05-12 ENCOUNTER — Encounter (HOSPITAL_COMMUNITY): Payer: Self-pay | Admitting: Emergency Medicine

## 2021-05-12 ENCOUNTER — Emergency Department (HOSPITAL_COMMUNITY): Payer: Medicare Other

## 2021-05-12 ENCOUNTER — Inpatient Hospital Stay (HOSPITAL_COMMUNITY)
Admission: EM | Admit: 2021-05-12 | Discharge: 2021-05-20 | DRG: 919 | Disposition: A | Discharge status: Skilled Nursing Facility | Payer: Medicare Other | Attending: Infectious Diseases | Admitting: Infectious Diseases

## 2021-05-12 DIAGNOSIS — I083 Combined rheumatic disorders of mitral, aortic and tricuspid valves: Secondary | ICD-10-CM | POA: Diagnosis present

## 2021-05-12 DIAGNOSIS — R778 Other specified abnormalities of plasma proteins: Secondary | ICD-10-CM | POA: Diagnosis not present

## 2021-05-12 DIAGNOSIS — I10 Essential (primary) hypertension: Secondary | ICD-10-CM | POA: Diagnosis not present

## 2021-05-12 DIAGNOSIS — E785 Hyperlipidemia, unspecified: Secondary | ICD-10-CM | POA: Diagnosis present

## 2021-05-12 DIAGNOSIS — D62 Acute posthemorrhagic anemia: Secondary | ICD-10-CM | POA: Diagnosis present

## 2021-05-12 DIAGNOSIS — I252 Old myocardial infarction: Secondary | ICD-10-CM

## 2021-05-12 DIAGNOSIS — E1151 Type 2 diabetes mellitus with diabetic peripheral angiopathy without gangrene: Secondary | ICD-10-CM | POA: Diagnosis present

## 2021-05-12 DIAGNOSIS — I340 Nonrheumatic mitral (valve) insufficiency: Secondary | ICD-10-CM | POA: Diagnosis not present

## 2021-05-12 DIAGNOSIS — I120 Hypertensive chronic kidney disease with stage 5 chronic kidney disease or end stage renal disease: Secondary | ICD-10-CM | POA: Diagnosis not present

## 2021-05-12 DIAGNOSIS — F17210 Nicotine dependence, cigarettes, uncomplicated: Secondary | ICD-10-CM | POA: Diagnosis present

## 2021-05-12 DIAGNOSIS — Z992 Dependence on renal dialysis: Secondary | ICD-10-CM

## 2021-05-12 DIAGNOSIS — Z833 Family history of diabetes mellitus: Secondary | ICD-10-CM

## 2021-05-12 DIAGNOSIS — E872 Acidosis, unspecified: Secondary | ICD-10-CM | POA: Diagnosis present

## 2021-05-12 DIAGNOSIS — Z888 Allergy status to other drugs, medicaments and biological substances status: Secondary | ICD-10-CM | POA: Diagnosis not present

## 2021-05-12 DIAGNOSIS — K7200 Acute and subacute hepatic failure without coma: Secondary | ICD-10-CM | POA: Diagnosis present

## 2021-05-12 DIAGNOSIS — I21A1 Myocardial infarction type 2: Secondary | ICD-10-CM | POA: Diagnosis present

## 2021-05-12 DIAGNOSIS — N179 Acute kidney failure, unspecified: Secondary | ICD-10-CM | POA: Diagnosis present

## 2021-05-12 DIAGNOSIS — K219 Gastro-esophageal reflux disease without esophagitis: Secondary | ICD-10-CM | POA: Diagnosis present

## 2021-05-12 DIAGNOSIS — I132 Hypertensive heart and chronic kidney disease with heart failure and with stage 5 chronic kidney disease, or end stage renal disease: Secondary | ICD-10-CM | POA: Diagnosis present

## 2021-05-12 DIAGNOSIS — N99840 Postprocedural hematoma of a genitourinary system organ or structure following a genitourinary system procedure: Principal | ICD-10-CM | POA: Diagnosis present

## 2021-05-12 DIAGNOSIS — Z794 Long term (current) use of insulin: Secondary | ICD-10-CM

## 2021-05-12 DIAGNOSIS — E1122 Type 2 diabetes mellitus with diabetic chronic kidney disease: Secondary | ICD-10-CM | POA: Diagnosis present

## 2021-05-12 DIAGNOSIS — I509 Heart failure, unspecified: Secondary | ICD-10-CM | POA: Diagnosis not present

## 2021-05-12 DIAGNOSIS — M898X9 Other specified disorders of bone, unspecified site: Secondary | ICD-10-CM | POA: Diagnosis present

## 2021-05-12 DIAGNOSIS — I129 Hypertensive chronic kidney disease with stage 1 through stage 4 chronic kidney disease, or unspecified chronic kidney disease: Secondary | ICD-10-CM | POA: Diagnosis not present

## 2021-05-12 DIAGNOSIS — I255 Ischemic cardiomyopathy: Secondary | ICD-10-CM | POA: Diagnosis present

## 2021-05-12 DIAGNOSIS — I361 Nonrheumatic tricuspid (valve) insufficiency: Secondary | ICD-10-CM | POA: Diagnosis not present

## 2021-05-12 DIAGNOSIS — E875 Hyperkalemia: Secondary | ICD-10-CM | POA: Diagnosis present

## 2021-05-12 DIAGNOSIS — Y848 Other medical procedures as the cause of abnormal reaction of the patient, or of later complication, without mention of misadventure at the time of the procedure: Secondary | ICD-10-CM | POA: Diagnosis present

## 2021-05-12 DIAGNOSIS — I25110 Atherosclerotic heart disease of native coronary artery with unstable angina pectoris: Secondary | ICD-10-CM | POA: Diagnosis present

## 2021-05-12 DIAGNOSIS — Z79899 Other long term (current) drug therapy: Secondary | ICD-10-CM

## 2021-05-12 DIAGNOSIS — N186 End stage renal disease: Secondary | ICD-10-CM

## 2021-05-12 DIAGNOSIS — Z8249 Family history of ischemic heart disease and other diseases of the circulatory system: Secondary | ICD-10-CM

## 2021-05-12 DIAGNOSIS — N2581 Secondary hyperparathyroidism of renal origin: Secondary | ICD-10-CM | POA: Diagnosis present

## 2021-05-12 DIAGNOSIS — Z20822 Contact with and (suspected) exposure to covid-19: Secondary | ICD-10-CM | POA: Diagnosis present

## 2021-05-12 DIAGNOSIS — I5042 Chronic combined systolic (congestive) and diastolic (congestive) heart failure: Secondary | ICD-10-CM | POA: Diagnosis present

## 2021-05-12 DIAGNOSIS — E876 Hypokalemia: Secondary | ICD-10-CM | POA: Diagnosis not present

## 2021-05-12 DIAGNOSIS — E1165 Type 2 diabetes mellitus with hyperglycemia: Secondary | ICD-10-CM | POA: Diagnosis present

## 2021-05-12 DIAGNOSIS — N185 Chronic kidney disease, stage 5: Secondary | ICD-10-CM | POA: Diagnosis not present

## 2021-05-12 DIAGNOSIS — N3001 Acute cystitis with hematuria: Secondary | ICD-10-CM

## 2021-05-12 DIAGNOSIS — I214 Non-ST elevation (NSTEMI) myocardial infarction: Secondary | ICD-10-CM | POA: Diagnosis not present

## 2021-05-12 DIAGNOSIS — D631 Anemia in chronic kidney disease: Secondary | ICD-10-CM | POA: Diagnosis present

## 2021-05-12 DIAGNOSIS — N184 Chronic kidney disease, stage 4 (severe): Secondary | ICD-10-CM

## 2021-05-12 DIAGNOSIS — I2510 Atherosclerotic heart disease of native coronary artery without angina pectoris: Secondary | ICD-10-CM | POA: Diagnosis not present

## 2021-05-12 DIAGNOSIS — Z89511 Acquired absence of right leg below knee: Secondary | ICD-10-CM

## 2021-05-12 DIAGNOSIS — K661 Hemoperitoneum: Secondary | ICD-10-CM | POA: Diagnosis present

## 2021-05-12 LAB — BASIC METABOLIC PANEL
Anion gap: 16 — ABNORMAL HIGH (ref 5–15)
Anion gap: 13 (ref 5–15)
BUN: 70 mg/dL — ABNORMAL HIGH (ref 8–23)
BUN: 71 mg/dL — ABNORMAL HIGH (ref 8–23)
CO2: 16 mmol/L — ABNORMAL LOW (ref 22–32)
CO2: 17 mmol/L — ABNORMAL LOW (ref 22–32)
Calcium: 7.8 mg/dL — ABNORMAL LOW (ref 8.9–10.3)
Calcium: 7.6 mg/dL — ABNORMAL LOW (ref 8.9–10.3)
Chloride: 95 mmol/L — ABNORMAL LOW (ref 98–111)
Chloride: 99 mmol/L (ref 98–111)
Creatinine, Ser: 8.05 mg/dL — ABNORMAL HIGH (ref 0.61–1.24)
Creatinine, Ser: 7.71 mg/dL — ABNORMAL HIGH (ref 0.61–1.24)
GFR, Estimated: 7 mL/min — ABNORMAL LOW (ref >60)
GFR, Estimated: 7 mL/min — ABNORMAL LOW (ref >60)
Glucose, Bld: 313 mg/dL — ABNORMAL HIGH (ref 70–99)
Glucose, Bld: 269 mg/dL — ABNORMAL HIGH (ref 70–99)
Potassium: 5.7 mmol/L — ABNORMAL HIGH (ref 3.5–5.1)
Potassium: 5 mmol/L (ref 3.5–5.1)
Sodium: 127 mmol/L — ABNORMAL LOW (ref 135–145)
Sodium: 129 mmol/L — ABNORMAL LOW (ref 135–145)

## 2021-05-12 LAB — CBC WITH DIFFERENTIAL/PLATELET
Abs Immature Granulocytes: 0.13 10*3/uL — ABNORMAL HIGH (ref 0.00–0.07)
Basophils Absolute: 0 10*3/uL (ref 0.0–0.1)
Basophils Relative: 0 %
Eosinophils Absolute: 0 10*3/uL (ref 0.0–0.5)
Eosinophils Relative: 0 %
HCT: 20.9 % — ABNORMAL LOW (ref 39.0–52.0)
Hemoglobin: 6.9 g/dL — CL (ref 13.0–17.0)
Immature Granulocytes: 1 %
Lymphocytes Relative: 8 %
Lymphs Abs: 1 10*3/uL (ref 0.7–4.0)
MCH: 29.7 pg (ref 26.0–34.0)
MCHC: 33 g/dL (ref 30.0–36.0)
MCV: 90.1 fL (ref 80.0–100.0)
Monocytes Absolute: 0.8 10*3/uL (ref 0.1–1.0)
Monocytes Relative: 6 %
Neutro Abs: 11.4 10*3/uL — ABNORMAL HIGH (ref 1.7–7.7)
Neutrophils Relative %: 85 %
Platelets: 257 10*3/uL (ref 150–400)
RBC: 2.32 MIL/uL — ABNORMAL LOW (ref 4.22–5.81)
RDW: 13.1 % (ref 11.5–15.5)
WBC: 13.4 10*3/uL — ABNORMAL HIGH (ref 4.0–10.5)
nRBC: 0.1 % (ref 0.0–0.2)

## 2021-05-12 LAB — URINALYSIS, ROUTINE W REFLEX MICROSCOPIC
Bilirubin Urine: NEGATIVE
Glucose, UA: 150 mg/dL — AB
Ketones, ur: NEGATIVE mg/dL
Nitrite: NEGATIVE
Protein, ur: 100 mg/dL — AB
Specific Gravity, Urine: 1.011 (ref 1.005–1.030)
WBC, UA: >50 WBC/hpf — ABNORMAL HIGH (ref 0–5)
pH: 5 (ref 5.0–8.0)

## 2021-05-12 LAB — TROPONIN I (HIGH SENSITIVITY)
Troponin I (High Sensitivity): 1425 ng/L (ref <18)
Troponin I (High Sensitivity): 1452 ng/L (ref <18)
Troponin I (High Sensitivity): 1529 ng/L (ref <18)

## 2021-05-12 LAB — HEPATIC FUNCTION PANEL
ALT: 109 U/L — ABNORMAL HIGH (ref 0–44)
AST: 107 U/L — ABNORMAL HIGH (ref 15–41)
Albumin: 3 g/dL — ABNORMAL LOW (ref 3.5–5.0)
Alkaline Phosphatase: 98 U/L (ref 38–126)
Bilirubin, Direct: 0.2 mg/dL (ref 0.0–0.2)
Indirect Bilirubin: 0.8 mg/dL (ref 0.3–0.9)
Total Bilirubin: 1 mg/dL (ref 0.3–1.2)
Total Protein: 7.3 g/dL (ref 6.5–8.1)

## 2021-05-12 LAB — PREPARE RBC (CROSSMATCH)

## 2021-05-12 LAB — HEMOGLOBIN AND HEMATOCRIT, BLOOD
HCT: 26.4 % — ABNORMAL LOW (ref 39.0–52.0)
HCT: 27.1 % — ABNORMAL LOW (ref 39.0–52.0)
Hemoglobin: 9 g/dL — ABNORMAL LOW (ref 13.0–17.0)
Hemoglobin: 8.7 g/dL — ABNORMAL LOW (ref 13.0–17.0)

## 2021-05-12 LAB — PROTIME-INR
INR: 1.3 — ABNORMAL HIGH (ref 0.8–1.2)
Prothrombin Time: 15.8 seconds — ABNORMAL HIGH (ref 11.4–15.2)

## 2021-05-12 LAB — RESP PANEL BY RT-PCR (FLU A&B, COVID) ARPGX2
Influenza A by PCR: NEGATIVE
Influenza B by PCR: NEGATIVE
SARS Coronavirus 2 by RT PCR: NEGATIVE

## 2021-05-12 LAB — FIBRINOGEN: Fibrinogen: 651 mg/dL — ABNORMAL HIGH (ref 210–475)

## 2021-05-12 LAB — CK: Total CK: 271 U/L (ref 49–397)

## 2021-05-12 LAB — HEPATITIS B SURFACE ANTIGEN: Hepatitis B Surface Ag: NONREACTIVE

## 2021-05-12 LAB — CBG MONITORING, ED
Glucose-Capillary: 247 mg/dL — ABNORMAL HIGH (ref 70–99)
Glucose-Capillary: 298 mg/dL — ABNORMAL HIGH (ref 70–99)

## 2021-05-12 LAB — HEPATITIS B SURFACE ANTIBODY,QUALITATIVE: Hep B S Ab: REACTIVE — AB

## 2021-05-12 LAB — GLUCOSE, RANDOM: Glucose, Bld: 303 mg/dL — ABNORMAL HIGH (ref 70–99)

## 2021-05-12 LAB — MAGNESIUM: Magnesium: 2.2 mg/dL (ref 1.7–2.4)

## 2021-05-12 LAB — APTT: aPTT: 40 seconds — ABNORMAL HIGH (ref 24–36)

## 2021-05-12 LAB — HIV ANTIBODY (ROUTINE TESTING W REFLEX): HIV Screen 4th Generation wRfx: NONREACTIVE

## 2021-05-12 MED ORDER — SODIUM CHLORIDE 0.9% IV SOLUTION: Freq: Once | INTRAVENOUS | Status: DC

## 2021-05-12 MED ORDER — ACETAMINOPHEN 650 MG RE SUPP
650.0000 mg | Freq: Four times a day (QID) | RECTAL | Status: DC | PRN
Start: 2021-05-12 — End: 2021-05-21

## 2021-05-12 MED ORDER — LACTATED RINGERS IV BOLUS
1000.0000 mL | Freq: Once | INTRAVENOUS | Status: AC
  Administered 2021-05-12: 1000 mL via INTRAVENOUS

## 2021-05-12 MED ORDER — HYDROMORPHONE HCL 1 MG/ML IJ SOLN
0.5000 mg | Freq: Once | INTRAMUSCULAR | Status: AC
  Administered 2021-05-12: 0.5 mg via INTRAVENOUS
  Filled 2021-05-12: qty 1

## 2021-05-12 MED ORDER — INSULIN GLARGINE-YFGN 100 UNIT/ML ~~LOC~~ SOLN: 22.0000 [IU] | Freq: Every day | SUBCUTANEOUS | Status: DC

## 2021-05-12 MED ORDER — ALTEPLASE 2 MG IJ SOLR: 2.0000 mg | Freq: Once | INTRAMUSCULAR | Status: DC | PRN

## 2021-05-12 MED ORDER — SODIUM ZIRCONIUM CYCLOSILICATE 10 G PO PACK
10.0000 g | PACK | Freq: Once | ORAL | Status: AC
  Administered 2021-05-12: 10 g via ORAL
  Filled 2021-05-12: qty 1

## 2021-05-12 MED ORDER — INSULIN GLARGINE-YFGN 100 UNIT/ML ~~LOC~~ SOLN
15.0000 [IU] | Freq: Every day | SUBCUTANEOUS | Status: DC
  Administered 2021-05-12: 15 [IU] via SUBCUTANEOUS
  Filled 2021-05-12 (×2): qty 0.15

## 2021-05-12 MED ORDER — SODIUM BICARBONATE 650 MG PO TABS
650.0000 mg | ORAL_TABLET | Freq: Two times a day (BID) | ORAL | Status: DC
  Administered 2021-05-12: 650 mg via ORAL
  Filled 2021-05-12: qty 1

## 2021-05-12 MED ORDER — SODIUM CHLORIDE 0.9 % IV SOLN: 100.0000 mL | INTRAVENOUS | Status: DC | PRN

## 2021-05-12 MED ORDER — SODIUM CHLORIDE 0.9 % IV BOLUS
500.0000 mL | Freq: Once | INTRAVENOUS | Status: AC
  Administered 2021-05-12: 500 mL via INTRAVENOUS

## 2021-05-12 MED ORDER — CHLORHEXIDINE GLUCONATE CLOTH 2 % EX PADS: 6.0000 | MEDICATED_PAD | Freq: Every day | CUTANEOUS | Status: DC

## 2021-05-12 MED ORDER — PANTOPRAZOLE SODIUM 40 MG PO TBEC
40.0000 mg | DELAYED_RELEASE_TABLET | Freq: Every day | ORAL | Status: DC
  Administered 2021-05-13 – 2021-05-20 (×8): 40 mg via ORAL
  Filled 2021-05-12 (×8): qty 1

## 2021-05-12 MED ORDER — PENTAFLUOROPROP-TETRAFLUOROETH EX AERO
1.0000 "application " | INHALATION_SPRAY | CUTANEOUS | Status: DC | PRN
  Filled 2021-05-12: qty 116

## 2021-05-12 MED ORDER — SODIUM CHLORIDE 0.9 % IV BOLUS: 1000.0000 mL | Freq: Once | INTRAVENOUS | Status: DC

## 2021-05-12 MED ORDER — FLUTICASONE PROPIONATE 50 MCG/ACT NA SUSP
1.0000 | Freq: Every day | NASAL | Status: DC
  Administered 2021-05-14 – 2021-05-20 (×7): 1 via NASAL
  Filled 2021-05-12 (×2): qty 16

## 2021-05-12 MED ORDER — HEPARIN SODIUM (PORCINE) 1000 UNIT/ML DIALYSIS
1000.0000 [IU] | INTRAMUSCULAR | Status: DC | PRN
  Administered 2021-05-15: 1000 [IU] via INTRAVENOUS_CENTRAL
  Filled 2021-05-12 (×2): qty 1

## 2021-05-12 MED ORDER — SODIUM BICARBONATE 8.4 % IV SOLN
50.0000 meq | Freq: Once | INTRAVENOUS | Status: AC
  Administered 2021-05-12: 50 meq via INTRAVENOUS
  Filled 2021-05-12: qty 50

## 2021-05-12 MED ORDER — LIDOCAINE HCL (PF) 1 % IJ SOLN: 5.0000 mL | INTRAMUSCULAR | Status: DC | PRN

## 2021-05-12 MED ORDER — ROSUVASTATIN CALCIUM 5 MG PO TABS
10.0000 mg | ORAL_TABLET | Freq: Every day | ORAL | Status: DC
  Administered 2021-05-13 – 2021-05-20 (×8): 10 mg via ORAL
  Filled 2021-05-12 (×8): qty 2
  Filled 2021-05-12: qty 1

## 2021-05-12 MED ORDER — SODIUM CHLORIDE 0.9 % IV SOLN
1.0000 g | Freq: Once | INTRAVENOUS | Status: AC
  Administered 2021-05-12: 1 g via INTRAVENOUS
  Filled 2021-05-12: qty 10

## 2021-05-12 MED ORDER — ACETAMINOPHEN 325 MG PO TABS
650.0000 mg | ORAL_TABLET | Freq: Four times a day (QID) | ORAL | Status: DC | PRN
  Administered 2021-05-15 – 2021-05-18 (×5): 650 mg via ORAL
  Filled 2021-05-12 (×6): qty 2

## 2021-05-12 MED ORDER — INSULIN ASPART 100 UNIT/ML IJ SOLN
0.0000 [IU] | Freq: Three times a day (TID) | INTRAMUSCULAR | Status: DC
  Administered 2021-05-13: 1 [IU] via SUBCUTANEOUS
  Administered 2021-05-13: 2 [IU] via SUBCUTANEOUS
  Administered 2021-05-13 – 2021-05-14 (×2): 1 [IU] via SUBCUTANEOUS
  Administered 2021-05-14 – 2021-05-15 (×3): 2 [IU] via SUBCUTANEOUS

## 2021-05-12 MED ORDER — DULOXETINE HCL 60 MG PO CPEP
60.0000 mg | ORAL_CAPSULE | Freq: Every day | ORAL | Status: DC
  Administered 2021-05-13 – 2021-05-20 (×8): 60 mg via ORAL
  Filled 2021-05-12 (×9): qty 1

## 2021-05-12 MED ORDER — INSULIN ASPART 100 UNIT/ML IV SOLN
5.0000 [IU] | Freq: Once | INTRAVENOUS | Status: AC
  Administered 2021-05-12: 5 [IU] via INTRAVENOUS

## 2021-05-12 MED ORDER — LIDOCAINE-PRILOCAINE 2.5-2.5 % EX CREA
1.0000 "application " | TOPICAL_CREAM | CUTANEOUS | Status: DC | PRN
  Filled 2021-05-12: qty 5

## 2021-05-12 NOTE — ED Provider Triage Note (Signed)
Emergency Medicine Provider Triage Evaluation Note ? ?[name] , a 61 y.o. male  was evaluated in triage.  Pt complains of left flank pain, left abdominal pain, hematuria, dizziness, nausea.  Patient states symptoms began Friday after he had a kidney biopsy.  Denies shortness of breath, chest pain, vomiting. ? ?Review of Systems  ?Positive: Hematuria, nausea, dizziness, abdominal pain ?Negative: Shortness of breath, chest pain ? ?Physical Exam  ?BP (!) 145/79 (BP Location: Right Arm)   Pulse 98   Temp 98.6 ?F (37 ?C) (Oral)   Resp 16   SpO2 100%  ?Gen:   Awake, no distress   ?Resp:  Normal effort  ?MSK:   Moves extremities without difficulty  ?Other:   ? ?Medical Decision Making  ?Medically screening exam initiated at 11:14 AM.  Appropriate orders placed.  [name] was informed that the remainder of the evaluation will be completed by another provider, this initial triage assessment does not replace that evaluation, and the importance of remaining in the ED until their evaluation is complete. ? ? ?  ?Dorothyann Peng, PA-C ?05/12/21 1115 ? ?

## 2021-05-12 NOTE — ED Triage Notes (Signed)
Pt. Stated, I had a kidney BX on Friday , may 5th  on left side . Having pain on left side with some dizziness, and blood in urine. This started right after the bx ?

## 2021-05-12 NOTE — Hospital Course (Addendum)
Hospital course, resolved problems: ? ?#Ischemic Hepatitis, improving ?Patient also noted to have elevated transaminases on admission.  This is also likely secondary to patient's acute blood loss anemia.  No laboratory evidence of acute liver failure. ?-Continue to monitor  ?

## 2021-05-12 NOTE — ED Notes (Signed)
Dr.Bonnano notified of pt critical troponin of 1425. Will continue to monitor.  ?

## 2021-05-12 NOTE — ED Provider Notes (Signed)
?[name] ?Provider Note ? ? ?CSN: [id number] ?Arrival date & time: 05/12/21  1053 ? ?  ? ?History ?PMH:  diabetes II, hyperlipidemia, hypertension, CHF, CKD stage IV, right BKA  ?Chief Complaint  ?Patient presents with  ? Dizziness  ? left side pain  ? Hematuria  ? ? ?[name] is a 61 y.o. male.  ?Presents the ED with a chief complaint of left flank pain.  He states that he had a renal biopsy done on Friday and that ever since then he has had significant left flank pain that he rates as 8 out of 10.  The pain has been constant.  He says it has gradually worsened since the biopsy.  About 3 days ago he noticed he was having some hematuria.  He also has been progressively getting more more dizzy and weak throughout this week.  He is very fatigued as well.  He is not eating or drinking very much.  He is not on any anticoagulation. ? ? ?Dizziness ?Associated symptoms: weakness   ?Hematuria ? ? ?  ? ?Home Medications ?Prior to Admission medications   ?Medication Sig Start Date End Date Taking? Authorizing Provider  ?Accu-Chek FastClix Lancets MISC Check blood sugar 3 times a day 01/21/21   Iona Beard, MD  ?albuterol (VENTOLIN HFA) 108 (90 Base) MCG/ACT inhaler INHALE 1-2 PUFFS INTO THE LUNGS EVERY 6 (SIX) HOURS AS NEEDED (WHEEZING OR COUGH). 02/18/21 02/18/22  [name], MD  ?amLODipine (NORVASC) 10 MG tablet Take 1 tablet (10 mg total) by mouth daily. 10/20/20   Harvie Heck, MD  ?aspirin EC 81 MG tablet Take 1 tablet (81 mg total) by mouth daily. Swallow whole. 10/20/20   Harvie Heck, MD  ?blood glucose meter kit and supplies KIT Dispense based on patient and insurance preference. Use up to four times daily as directed. (FOR ICD-9 250.00, 250.01). 04/16/15   Norman Herrlich, MD  ?Blood Glucose Monitoring Suppl (ACCU-CHEK AVIVA PLUS) w/Device KIT Check blood sugar 3 times a day 12/19/15   Sid Falcon, MD  ?carvedilol (COREG) 6.25 MG tablet Take 1 tablet (6.25 mg  total) by mouth 2 (two) times daily with a meal. 10/20/20   Harvie Heck, MD  ?cetirizine (ZYRTEC ALLERGY) 10 MG tablet Take 1 tablet (10 mg total) by mouth daily. 10/20/20   Harvie Heck, MD  ?Continuous Blood Gluc Sensor (FREESTYLE LIBRE 2 SENSOR) MISC Place 1 sensor on the skin every 14 days. Use to check glucose continuously 02/05/21   [name], MD  ?diphenhydrAMINE-Zinc Acetate (BENADRYL ITCH RELIEF STICK) 2-0.1 % STCK Apply 1 Bar topically as needed. 09/17/20   Delene Ruffini, MD  ?DULoxetine (CYMBALTA) 30 MG capsule Take 2 capsules (60 mg total) by mouth daily. 10/20/20   Harvie Heck, MD  ?esomeprazole (NEXIUM) 40 MG capsule Take 1 capsule (40 mg total) by mouth daily. 10/20/20   Harvie Heck, MD  ?fluticasone (FLONASE) 50 MCG/ACT nasal spray Place 1 spray into both nostrils daily. 10/20/20   Harvie Heck, MD  ?glucose blood (ACCU-CHEK AVIVA PLUS) test strip Check blood sugar 3 times a day 01/21/21   Iona Beard, MD  ?insulin aspart (NOVOLOG) 100 UNIT/ML FlexPen Inject 11 Units into the skin 3 (three) times daily with meals. 10/20/20   Harvie Heck, MD  ?Insulin Glargine (BASAGLAR KWIKPEN) 100 UNIT/ML Inject 22 Units into the skin at bedtime. 10/20/20   Harvie Heck, MD  ?Insulin Pen Needle 32G X 4 MM MISC Use to inject insulin 4  times a day 09/07/19   [name], MD  ?oxyCODONE-acetaminophen (PERCOCET) 5-325 MG tablet Take 1 tablet by mouth every 6 (six) hours as needed for severe pain. 05/08/21   Boisseau, Angie Fava, PA  ?pregabalin (LYRICA) 50 MG capsule Take 1 capsule (50 mg total) by mouth 2 (two) times daily. 10/20/20 01/18/21  Harvie Heck, MD  ?rosuvastatin (CRESTOR) 20 MG tablet Take 1 tablet (20 mg total) by mouth daily. 10/20/20   Harvie Heck, MD  ?Skin Protectants, Misc. (EUCERIN) cream Apply topically as needed for dry skin. 06/16/20   Maudie Mercury, MD  ?   ? ?Allergies    ?Benicar [olmesartan]   ? ?Review of Systems   ?Review of Systems  ?Constitutional:  Positive for  appetite change and fatigue.  ?Genitourinary:  Positive for flank pain and hematuria.  ?Neurological:  Positive for dizziness and weakness.  ?All other systems reviewed and are negative. ? ?Physical Exam ?Updated Vital Signs ?BP (!) 144/87   Pulse 90   Temp 97.8 ?F (36.6 ?C) (Oral)   Resp (!) 22   SpO2 96%  ?Physical Exam ?Vitals and nursing note reviewed.  ?Constitutional:   ?   General: He is not in acute distress. ?   Appearance: Normal appearance. He is ill-appearing. He is not toxic-appearing or diaphoretic.  ?HENT:  ?   Head: Normocephalic and atraumatic.  ?   Nose: No nasal deformity.  ?   Mouth/Throat:  ?   Lips: Pink. No lesions.  ?   Mouth: No injury, lacerations, oral lesions or angioedema.  ?   Pharynx: Uvula midline. No pharyngeal swelling or uvula swelling.  ?Eyes:  ?   General: Gaze aligned appropriately. No scleral icterus.    ?   Right eye: No discharge.     ?   Left eye: No discharge.  ?   Conjunctiva/sclera: Conjunctivae normal.  ?   Right eye: Right conjunctiva is not injected. No exudate or hemorrhage. ?   Left eye: Left conjunctiva is not injected. No exudate or hemorrhage. ?Cardiovascular:  ?   Rate and Rhythm: Normal rate and regular rhythm.  ?   Pulses: Normal pulses.     ?     Radial pulses are 2+ on the right side and 2+ on the left side.  ?     Dorsalis pedis pulses are 2+ on the right side and 2+ on the left side.  ?   Heart sounds: Normal heart sounds, S1 normal and S2 normal. Heart sounds not distant. No murmur heard. ?  No friction rub. No gallop. No S3 or S4 sounds.  ?Pulmonary:  ?   Effort: Pulmonary effort is normal. No accessory muscle usage or respiratory distress.  ?   Breath sounds: Normal breath sounds. No stridor. No wheezing, rhonchi or rales.  ?Chest:  ?   Chest wall: No tenderness.  ?Abdominal:  ?   General: Abdomen is flat. There is no distension.  ?   Palpations: Abdomen is soft. There is no mass or pulsatile mass.  ?   Tenderness: There is abdominal tenderness.  There is left CVA tenderness. There is no right CVA tenderness, guarding or rebound.  ?   Comments: Severe tenderness to the left lower quadrant and left CVA. Voluntary guarding present.   ?Musculoskeletal:  ?   Right lower leg: No edema.  ?   Left lower leg: No edema.  ?Skin: ?   General: Skin is warm and dry.  ?   Coloration: Skin is  not jaundiced or pale.  ?   Findings: No bruising, erythema, lesion or rash.  ?Neurological:  ?   General: No focal deficit present.  ?   Mental Status: He is alert and oriented to person, place, and time.  ?   GCS: GCS eye subscore is 4. GCS verbal subscore is 5. GCS motor subscore is 6.  ?Psychiatric:     ?   Mood and Affect: Mood normal.     ?   Behavior: Behavior normal. Behavior is cooperative.  ? ? ?ED Results / Procedures / Treatments   ?Labs ?(all labs ordered are listed, but only abnormal results are displayed) ?Labs Reviewed  ?BASIC METABOLIC PANEL - Abnormal; Notable for the following components:  ?    Result Value  ? Sodium 127 (*)   ? Potassium 5.7 (*)   ? Chloride 95 (*)   ? CO2 16 (*)   ? Glucose, Bld 313 (*)   ? BUN 70 (*)   ? Creatinine, Ser 8.05 (*)   ? Calcium 7.8 (*)   ? GFR, Estimated 7 (*)   ? Anion gap 16 (*)   ? All other components within normal limits  ?CBC WITH DIFFERENTIAL/PLATELET - Abnormal; Notable for the following components:  ? WBC 13.4 (*)   ? RBC 2.32 (*)   ? Hemoglobin 6.9 (*)   ? HCT 20.9 (*)   ? Neutro Abs 11.4 (*)   ? Abs Immature Granulocytes 0.13 (*)   ? All other components within normal limits  ?URINALYSIS, ROUTINE W REFLEX MICROSCOPIC - Abnormal; Notable for the following components:  ? APPearance HAZY (*)   ? Glucose, UA 150 (*)   ? Hgb urine dipstick MODERATE (*)   ? Protein, ur 100 (*)   ? Leukocytes,Ua LARGE (*)   ? WBC, UA >50 (*)   ? Bacteria, UA MANY (*)   ? All other components within normal limits  ?GLUCOSE, RANDOM - Abnormal; Notable for the following components:  ? Glucose, Bld 303 (*)   ? All other components within normal  limits  ?PROTIME-INR - Abnormal; Notable for the following components:  ? Prothrombin Time 15.8 (*)   ? INR 1.3 (*)   ? All other components within normal limits  ?APTT - Abnormal; Notable for the following components

## 2021-05-12 NOTE — H&P (Addendum)
? ? ? ?Date: 05/12/2021     ?     ?     ?Patient Name:  [name] MRN: [id number]  ?DOB: 22-Oct-1960 Age / Sex: 61 y.o., male   ?PCP: [name], MD    ?     ?Medical Service: Internal Medicine Teaching Service    ?     ?Attending Physician: Dr. Heber Alabaster, Rachel Moulds, DO    ?First Contact: Dr. Lorin Glass Pager: [phone]  ?Second Contact: Dr. Eulas Post Pager: [phone]  ?     ?After Hours (After 5p/  First Contact Pager: [phone]  ?weekends / holidays): Second Contact Pager: [phone]  ? ?Chief Complaint: Fatigue, hematuria, left flank pain ? ?History of Present Illness: [name] is a 61 y.o. 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 ? ?Patient states that he got a renal biopsy last Friday, 5/5.  Ever since the procedure, he has been feeling dizzy, has been having pain in the left lower abdomen radiating to the left side of his back, and now has hematuria.  He has felt more bloated lately and feels that his abdomen is distended.  The pain was sharp in nature and brought the patient to tears.  It was constant but is now intermittent.  Endorses poor p.o. intake due to not feeling well after the procedure.  Endorses 1 episode of emesis that he states was red-streaked.  Denies fevers or chills.  States that he has felt constipated, last bowel movement was a few days ago.  Endorses some lethargy and presyncopal symptoms.  Denies swelling in the legs or dysuria.  No LOC or nausea.  No chest pain or shortness of breath.  No other complaints or concerns today. ? ?Meds:  ?Current Meds  ?Medication Sig  ? amLODipine (NORVASC) 10 MG tablet Take 1 tablet (10 mg total) by mouth daily.  ? carvedilol (COREG) 6.25 MG tablet Take 1 tablet (6.25 mg total) by mouth 2 (two) times daily with a meal.  ? cetirizine (ZYRTEC ALLERGY) 10 MG tablet Take 1 tablet (10 mg  total) by mouth daily.  ? diphenhydrAMINE-Zinc Acetate (BENADRYL ITCH RELIEF STICK) 2-0.1 % STCK Apply 1 Bar topically as needed.  ? DULoxetine (CYMBALTA) 30 MG capsule Take 2 capsules (60 mg total) by mouth daily.  ? esomeprazole (NEXIUM) 40 MG capsule Take 1 capsule (40 mg total) by mouth daily.  ? fluticasone (FLONASE) 50 MCG/ACT nasal spray Place 1 spray into both nostrils daily.  ? insulin aspart (NOVOLOG) 100 UNIT/ML FlexPen Inject 11 Units into the skin 3 (three) times daily with meals.  ? Insulin Glargine (BASAGLAR KWIKPEN) 100 UNIT/ML Inject 22 Units into the skin at bedtime.  ? rosuvastatin (CRESTOR) 20 MG tablet Take 1 tablet (20 mg total) by mouth daily.  ? ? ?Allergies: ?Allergies as of 05/12/2021 - Review Complete 05/12/2021  ?Allergen Reaction Noted  ? Benicar [olmesartan] Cough 09/28/2013  ? ?Past Medical History:  ?Diagnosis Date  ? CHF (congestive heart failure) ([place])   ? Critical lower limb ischemia, lt with ABI of 0.60 12/31/2011  ? Gangrene ([place])   ? right foot  ? GERD (gastroesophageal reflux disease)   ? Hyperlipidemia   ? Hypertension   ? Neuromuscular disorder ([place])   ? diabetic neruopathy - hands  ? Osteomyelitis ([place]) 2010  ? left foot,  s/p midfoot amputation  ? Osteomyelitis ([place]) 09/2013  ? RT BKA  ? Osteomyelitis of ankle or foot 05/2011  ? rt foot, s/p 5th ray amputation  ? PAD (peripheral artery disease) ([place])   ? ABIs 11/30/11: L ABI 0.68, R ABI 0.84  ? Pneumonia 2010  ? PVD (peripheral vascular disease) ([place]) 12/31/2011  ? S/P angioplasty with stent, 12/30/11, of Lt SFA, Post. tibialis and PTA of L. ant and post. tibial vessels 12/31/2011  ? SOB (shortness of breath)   ? uses inhaler prn  ? Type II diabetes mellitus ([place]) ~ 2002  ? ?FH:  ?Mom with T2DM ? ?SH: ?Lives in [place], states that he needs help around the house but has been "pushing through" on his own. Lives alone. Smokes tobacco, about 1/3rd of a pack a day, started smoking 30 years ago. Denies alcohol use.  ? ?Review of  Systems: ?A complete ROS was negative except as per HPI.  ? ?Physical Exam: ?Blood pressure (!) 150/83, pulse 91, temperature 98 ?F (36.7 ?C), temperature source Oral, resp. rate 20, SpO2 96 %. ?General: NAD, chronically ill-appearing ?HE: Normocephalic, atraumatic, EOMI, Conjunctivae normal ?ENT: No congestion, no rhinorrhea, no exudate or erythema, dry mucous membranes ?Cardiovascular: Normal rate, regular rhythm. No murmurs, rubs, or gallops ?Pulmonary: Effort normal, breath sounds normal. No wheezes, rales, or rhonchi ?Abdominal: soft, bowel sounds present, tenderness to palpation of left lower quadrant with guarding ?Musculoskeletal: No swelling. S/p R BKA, s/p left midfoot amputation. ?Skin: Warm and dry. There are small pressure injuries on the lateral aspect of the LLE and at most distal aspect of his RLE. ?Psychiatric/Behavioral: normal mood, normal behavior    ? ?EKG: personally reviewed my interpretation is sinus rhythm, no peaked T waves, new T-wave inversion in V3 ? ?CT renal stone study: ?IMPRESSION: ?1. New large retroperitoneal hematoma extending from the lower pole ?of the left kidney inferiorly into the pelvis. ?2. Left renal subcapsular hematoma, similar in size to the prior CT. ?3. Diffuse bladder wall thickening, correlate for cystitis. ?4. Aortic Atherosclerosis. ? ? ?Assessment & Plan by Problem: ?Principal Problem: ?  Acute blood loss anemia ? ?Symptomatic anemia ?Large retroperitoneal hematoma ?Troponemia ?The patient presents here with complaints of left lower abdominal and flank pain, hematuria, and dizziness s/p a recent renal biopsy.  Since arrival to the ED, he has been afebrile and hemodynamically stable with systolic BP's ranging in the 130s-150s.  On exam, there is severe guarding and tenderness to palpation of the left lower quadrant.  Labs are significant for hemoglobin of 6.9, WBC 13.4. UA shows moderate hemoglobin, large leukocytes, >50 WBCs, and many bacteria.  Troponins are  elevated to the 1400s today though patient is without symptoms of chest pain and shortness of breath, likely from significant demand ischemia.  Imaging today shows a new large retroperitoneal hematoma, when compared to imaging from last Friday.  Interventional radiology has been consulted, and they plan on obtaining CTA to guide intervention (embolization). Patient is also currently receiving his second unit of PRBCs. ?-IR is on board, appreciate their recommendations ?-CT angio abdomen w/wo contrast prior to possible embolization ?-Follow-up posttransfusion H&H ?-Trend CBC ?-Trend troponins ?-Will obtain records from [facility] in the AM ? 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  CKD most likely 2/2 poorly controlled type 2 diabetes  and hypertension.  GFR prior to renal biopsy of 14, Cr at the time of 4.43.  Today, the patient's creatinine is 8.05, GFR 7.  Bicarb 16. K 5.7 though no EKG changes reflecting hyperkalemia.  Nephrology has been consulted, they are recommending dialysis.  IR will place a tunneled catheter tomorrow and afterwards will begin the process of setting the patient up for outpatient dialysis. ?-Nephrology is on board, appreciate their recommendations ?-IR is on board, appreciate their recommendations ?-N.p.o. at midnight for tunneled catheter tomorrow by IR ?-Trend BMP ?-Avoid nephrotoxic medications ?-Lokelma, NS, sodium bicarb, and insulin per nephrology ? ?HTN ?The patient is on amlodipine 10 mg daily and Coreg 6.25 twice daily for his HTN. ?-Continue home medications ? ?Poorly controlled type 2 diabetes ?Most recent A1c of 11.9 in January 2023.  At home, he has been on glargine 22 units nightly and NovoLog 11 units 3 times daily with meals. ?-Start glargine 15 units qhs ?-CBG  monitoring ?-SSI with meals ? ?H/o CHF, likely HFpEF ?Appears compensated. Most recent echo in 2015 showed normal EF with G1DD. ?-CTM ? ?Dispo: Admit patient to Inpatient with expected length of stay greater than 2 m
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
## 2021-05-12 NOTE — ED Notes (Signed)
Patient provided with [place] sandwich, crackers, and OJ per request. Patient informed that NPO at midnight. Patient aware and no questions at this time. VSS. Resting comfortably  ?

## 2021-05-12 NOTE — ED Triage Notes (Signed)
Hgb 6.9 ?
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
## 2021-05-13 ENCOUNTER — Inpatient Hospital Stay (HOSPITAL_COMMUNITY): Payer: Medicare Other

## 2021-05-13 ENCOUNTER — Encounter (HOSPITAL_COMMUNITY): Payer: Self-pay | Admitting: Internal Medicine

## 2021-05-13 ENCOUNTER — Encounter (HOSPITAL_COMMUNITY): Payer: Self-pay

## 2021-05-13 ENCOUNTER — Other Ambulatory Visit: Payer: Self-pay

## 2021-05-13 DIAGNOSIS — K661 Hemoperitoneum: Secondary | ICD-10-CM | POA: Insufficient documentation

## 2021-05-13 DIAGNOSIS — I214 Non-ST elevation (NSTEMI) myocardial infarction: Secondary | ICD-10-CM | POA: Diagnosis not present

## 2021-05-13 DIAGNOSIS — R778 Other specified abnormalities of plasma proteins: Secondary | ICD-10-CM | POA: Diagnosis not present

## 2021-05-13 DIAGNOSIS — N186 End stage renal disease: Secondary | ICD-10-CM | POA: Diagnosis not present

## 2021-05-13 DIAGNOSIS — Z992 Dependence on renal dialysis: Secondary | ICD-10-CM

## 2021-05-13 DIAGNOSIS — I361 Nonrheumatic tricuspid (valve) insufficiency: Secondary | ICD-10-CM | POA: Diagnosis not present

## 2021-05-13 DIAGNOSIS — I340 Nonrheumatic mitral (valve) insufficiency: Secondary | ICD-10-CM

## 2021-05-13 DIAGNOSIS — D62 Acute posthemorrhagic anemia: Secondary | ICD-10-CM | POA: Diagnosis not present

## 2021-05-13 HISTORY — PX: IR FLUORO GUIDE CV LINE RIGHT: IMG2283

## 2021-05-13 HISTORY — PX: IR US GUIDE VASC ACCESS RIGHT: IMG2390

## 2021-05-13 LAB — CBC
HCT: 26.3 % — ABNORMAL LOW (ref 39.0–52.0)
HCT: 26.9 % — ABNORMAL LOW (ref 39.0–52.0)
Hemoglobin: 8.7 g/dL — ABNORMAL LOW (ref 13.0–17.0)
Hemoglobin: 9 g/dL — ABNORMAL LOW (ref 13.0–17.0)
MCH: 28.6 pg (ref 26.0–34.0)
MCH: 29 pg (ref 26.0–34.0)
MCHC: 33.1 g/dL (ref 30.0–36.0)
MCHC: 33.5 g/dL (ref 30.0–36.0)
MCV: 86.5 fL (ref 80.0–100.0)
MCV: 86.8 fL (ref 80.0–100.0)
Platelets: 240 10*3/uL (ref 150–400)
Platelets: 248 10*3/uL (ref 150–400)
RBC: 3.04 MIL/uL — ABNORMAL LOW (ref 4.22–5.81)
RBC: 3.1 MIL/uL — ABNORMAL LOW (ref 4.22–5.81)
RDW: 15.4 % (ref 11.5–15.5)
RDW: 15.5 % (ref 11.5–15.5)
WBC: 11.2 10*3/uL — ABNORMAL HIGH (ref 4.0–10.5)
WBC: 11 10*3/uL — ABNORMAL HIGH (ref 4.0–10.5)
nRBC: 0.5 % — ABNORMAL HIGH (ref 0.0–0.2)
nRBC: 0.5 % — ABNORMAL HIGH (ref 0.0–0.2)

## 2021-05-13 LAB — BPAM RBC
Blood Product Expiration Date: 202305162359
Blood Product Expiration Date: 202305162359
ISSUE DATE / TIME: 202305091359
ISSUE DATE / TIME: 202305091647
Unit Type and Rh: 6200
Unit Type and Rh: 6200

## 2021-05-13 LAB — TYPE AND SCREEN
ABO/RH(D): A POS
Antibody Screen: NEGATIVE
Unit division: 0
Unit division: 0

## 2021-05-13 LAB — COMPREHENSIVE METABOLIC PANEL
ALT: 119 U/L — ABNORMAL HIGH (ref 0–44)
AST: 100 U/L — ABNORMAL HIGH (ref 15–41)
Albumin: 2.8 g/dL — ABNORMAL LOW (ref 3.5–5.0)
Alkaline Phosphatase: 116 U/L (ref 38–126)
Anion gap: 12 (ref 5–15)
BUN: 69 mg/dL — ABNORMAL HIGH (ref 8–23)
CO2: 19 mmol/L — ABNORMAL LOW (ref 22–32)
Calcium: 7.8 mg/dL — ABNORMAL LOW (ref 8.9–10.3)
Chloride: 101 mmol/L (ref 98–111)
Creatinine, Ser: 7.43 mg/dL — ABNORMAL HIGH (ref 0.61–1.24)
GFR, Estimated: 8 mL/min — ABNORMAL LOW (ref >60)
Glucose, Bld: 250 mg/dL — ABNORMAL HIGH (ref 70–99)
Potassium: 5 mmol/L (ref 3.5–5.1)
Sodium: 132 mmol/L — ABNORMAL LOW (ref 135–145)
Total Bilirubin: 1.7 mg/dL — ABNORMAL HIGH (ref 0.3–1.2)
Total Protein: 6.7 g/dL (ref 6.5–8.1)

## 2021-05-13 LAB — IRON AND TIBC
Iron: 159 ug/dL (ref 45–182)
Saturation Ratios: 54 % — ABNORMAL HIGH (ref 17.9–39.5)
TIBC: 295 ug/dL (ref 250–450)
UIBC: 136 ug/dL

## 2021-05-13 LAB — GLUCOSE, CAPILLARY
Glucose-Capillary: 191 mg/dL — ABNORMAL HIGH (ref 70–99)
Glucose-Capillary: 180 mg/dL — ABNORMAL HIGH (ref 70–99)
Glucose-Capillary: 170 mg/dL — ABNORMAL HIGH (ref 70–99)

## 2021-05-13 LAB — PHOSPHORUS: Phosphorus: 4.9 mg/dL — ABNORMAL HIGH (ref 2.5–4.6)

## 2021-05-13 LAB — ECHOCARDIOGRAM COMPLETE
AR max vel: 1.98 cm2
AV Peak grad: 4.8 mmHg
Ao pk vel: 1.1 m/s
Area-P 1/2: 6.54 cm2
Calc EF: 45.9 %
MV M vel: 4.99 m/s
MV Peak grad: 99.6 mmHg
S' Lateral: 4 cm
Single Plane A2C EF: 45.9 %
Single Plane A4C EF: 46.5 %

## 2021-05-13 LAB — URINALYSIS, ROUTINE W REFLEX MICROSCOPIC
Bacteria, UA: NONE SEEN
Bilirubin Urine: NEGATIVE
Glucose, UA: 150 mg/dL — AB
Ketones, ur: NEGATIVE mg/dL
Nitrite: NEGATIVE
Protein, ur: 100 mg/dL — AB
Specific Gravity, Urine: 1.009 (ref 1.005–1.030)
WBC, UA: >50 WBC/hpf — ABNORMAL HIGH (ref 0–5)
pH: 6 (ref 5.0–8.0)

## 2021-05-13 LAB — CBG MONITORING, ED
Glucose-Capillary: 215 mg/dL — ABNORMAL HIGH (ref 70–99)
Glucose-Capillary: 185 mg/dL — ABNORMAL HIGH (ref 70–99)

## 2021-05-13 LAB — TROPONIN I (HIGH SENSITIVITY)
Troponin I (High Sensitivity): 1540 ng/L (ref <18)
Troponin I (High Sensitivity): 1743 ng/L (ref <18)
Troponin I (High Sensitivity): 1330 ng/L (ref <18)

## 2021-05-13 LAB — PROTIME-INR
INR: 1.2 (ref 0.8–1.2)
Prothrombin Time: 15.5 seconds — ABNORMAL HIGH (ref 11.4–15.2)

## 2021-05-13 LAB — FERRITIN: Ferritin: 89 ng/mL (ref 24–336)

## 2021-05-13 LAB — HEPATITIS B SURFACE ANTIBODY, QUANTITATIVE: Hep B S AB Quant (Post): 942.8 m[IU]/mL (ref >9.9)

## 2021-05-13 MED ORDER — FENTANYL CITRATE (PF) 100 MCG/2ML IJ SOLN
INTRAMUSCULAR | Status: AC | PRN
  Administered 2021-05-13: 25 ug via INTRAVENOUS

## 2021-05-13 MED ORDER — HEPARIN SODIUM (PORCINE) 1000 UNIT/ML IJ SOLN
INTRAMUSCULAR | Status: AC
  Administered 2021-05-13: 3.2 mL
  Filled 2021-05-13: qty 10

## 2021-05-13 MED ORDER — CHLORHEXIDINE GLUCONATE CLOTH 2 % EX PADS
6.0000 | MEDICATED_PAD | Freq: Every day | CUTANEOUS | Status: DC
  Administered 2021-05-14: 6 via TOPICAL

## 2021-05-13 MED ORDER — FENTANYL CITRATE (PF) 100 MCG/2ML IJ SOLN
INTRAMUSCULAR | Status: AC
  Filled 2021-05-13: qty 2

## 2021-05-13 MED ORDER — CEFAZOLIN SODIUM-DEXTROSE 2-4 GM/100ML-% IV SOLN
INTRAVENOUS | Status: AC | PRN
  Administered 2021-05-13: 2 g via INTRAVENOUS

## 2021-05-13 MED ORDER — IOHEXOL 350 MG/ML SOLN
70.0000 mL | Freq: Once | INTRAVENOUS | Status: AC | PRN
  Administered 2021-05-13: 70 mL via INTRAVENOUS

## 2021-05-13 MED ORDER — DARBEPOETIN ALFA 40 MCG/0.4ML IJ SOSY
40.0000 ug | PREFILLED_SYRINGE | INTRAMUSCULAR | Status: DC
  Administered 2021-05-14: 40 ug via SUBCUTANEOUS
  Filled 2021-05-13 (×2): qty 0.4

## 2021-05-13 MED ORDER — MIDAZOLAM HCL 2 MG/2ML IJ SOLN
INTRAMUSCULAR | Status: AC | PRN
  Administered 2021-05-13: .5 mg via INTRAVENOUS

## 2021-05-13 MED ORDER — CARVEDILOL 3.125 MG PO TABS: 6.2500 mg | ORAL_TABLET | Freq: Two times a day (BID) | ORAL | Status: DC

## 2021-05-13 MED ORDER — MIDAZOLAM HCL 2 MG/2ML IJ SOLN
INTRAMUSCULAR | Status: AC
  Filled 2021-05-13: qty 2

## 2021-05-13 MED ORDER — LIDOCAINE HCL 1 % IJ SOLN
INTRAMUSCULAR | Status: AC
  Administered 2021-05-13: 10 mL
  Filled 2021-05-13: qty 20

## 2021-05-13 MED ORDER — INSULIN GLARGINE-YFGN 100 UNIT/ML ~~LOC~~ SOLN
20.0000 [IU] | Freq: Every day | SUBCUTANEOUS | Status: DC
  Administered 2021-05-13 – 2021-05-17 (×5): 20 [IU] via SUBCUTANEOUS
  Filled 2021-05-13 (×7): qty 0.2

## 2021-05-13 MED ORDER — AMLODIPINE BESYLATE 10 MG PO TABS
10.0000 mg | ORAL_TABLET | Freq: Every day | ORAL | Status: DC
Start: 2021-05-13 — End: 2021-05-15
  Administered 2021-05-13 – 2021-05-14 (×2): 10 mg via ORAL
  Filled 2021-05-13: qty 2
  Filled 2021-05-13: qty 1

## 2021-05-13 MED ORDER — INSULIN ASPART 100 UNIT/ML IJ SOLN: 5.0000 [IU] | Freq: Three times a day (TID) | INTRAMUSCULAR | Status: DC

## 2021-05-13 MED ORDER — CEFAZOLIN SODIUM-DEXTROSE 2-4 GM/100ML-% IV SOLN
INTRAVENOUS | Status: AC
Start: 2021-05-13 — End: 2021-05-14
  Filled 2021-05-13: qty 100

## 2021-05-13 MED ORDER — CARVEDILOL 12.5 MG PO TABS
12.5000 mg | ORAL_TABLET | Freq: Two times a day (BID) | ORAL | Status: DC
  Administered 2021-05-13 – 2021-05-14 (×3): 12.5 mg via ORAL
  Filled 2021-05-13 (×3): qty 1

## 2021-05-13 NOTE — Procedures (Signed)
Interventional Radiology Procedure Note ? ?Procedure: Tunneled HD catheter placement ? ?Complications: None ? ?Estimated Blood Loss: < 10 mL ? ?Findings: ?Right IJ 19 cm tip to cuff tunneled Palindrome catheter placed with tip in RA. OK to use. ? ?Eulas Post T. Kathlene Cote, M.D ?Pager:  [phone] ? ?  ?

## 2021-05-13 NOTE — Progress Notes (Addendum)
[facility] Note ? ?Progress Note ? ?Patient Name: [name] ?Date of Encounter: 05/13/2021 ? ?Primary Cardiologist: New to [facility] ? ?Subjective  ? ?Troponin has continued to down trend ?Limited Echo is pending. ?Does not remember seeing Drs [name] and Stanford Breed in 2011, 2013 ? ?No chest pain, No SOB, no palpitations. ?Patient is cold.  He is hoping to get things down so he can eat. ? ?Inpatient Medications  ?  ?Scheduled Meds: ? sodium chloride   Intravenous Once  ? Chlorhexidine Gluconate Cloth  6 each Topical Q0600  ? darbepoetin (ARANESP) injection - NON-DIALYSIS  40 mcg Subcutaneous Q Wed-1800  ? DULoxetine  60 mg Oral Daily  ? fluticasone  1 spray Each Nare Daily  ? insulin aspart  0-6 Units Subcutaneous TID WC  ? insulin glargine-yfgn  20 Units Subcutaneous QHS  ? pantoprazole  40 mg Oral Daily  ? rosuvastatin  10 mg Oral Daily  ? ?Continuous Infusions: ? sodium chloride    ? sodium chloride    ? ?PRN Meds: ?sodium chloride, sodium chloride, acetaminophen **OR** acetaminophen, alteplase, heparin, lidocaine (PF), lidocaine-prilocaine, pentafluoroprop-tetrafluoroeth  ? ?Vital Signs  ?  ?Vitals:  ? 05/13/21 0515 05/13/21 0600 05/13/21 0847 05/13/21 1015  ?BP: (!) 168/103 (!) 167/98 (!) 162/84 (!) 171/80  ?Pulse: 94 92 94 90  ?Resp:  (!) 24 (!) 23 20  ?Temp:   98.3 ?F (36.8 ?C)   ?TempSrc:   Oral   ?SpO2: 93% 96% 100% 96%  ? ? ?Intake/Output Summary (Last 24 hours) at 05/13/2021 1203 ?Last data filed at 05/13/2021 1053 ?Gross per 24 hour  ?Intake 2230 ml  ?Output 1200 ml  ?Net 1030 ml  ? ?There were no vitals filed for this visit. ? ?Telemetry  ?  ?SR - Personally Reviewed ? ?ECG  ?  ?SR rate 98 anterolateral ST depressions, inferolateral ST depressions - Personally Reviewed ? ?Physical Exam  ? ?Gen: no distress ?Neck: No JVD ?Ears: Bilateral Pilar Plate Sign ?Cardiac: No Rubs or Gallops, No Murmur, RRR ?Respiratory: Clear to auscultation bilaterally, normal effort, normal  respiratory rate ?GI: Soft,  nontender, non-distended on my exam ?MS: No  edema ?Integument: Skin feels war, R BKA without tenderness or erythema ?Neuro:  At time of evaluation, alert and oriented to person/place/time/situation  ? ? ?Labs  ?  ?Chemistry ?Recent Labs  ?Lab 05/12/21 ?1124 05/12/21 ?1302 05/12/21 ?1303 05/12/21 ?1807 05/13/21 ?1914  ?NA 127*  --   --  129* 132*  ?K 5.7*  --   --  5.0 5.0  ?CL 95*  --   --  99 101  ?CO2 16*  --   --  17* 19*  ?GLUCOSE 313*  --  303* 269* 250*  ?BUN 70*  --   --  71* 69*  ?CREATININE 8.05*  --   --  7.71* 7.43*  ?CALCIUM 7.8*  --   --  7.6* 7.8*  ?PROT  --  7.3  --   --  6.7  ?ALBUMIN  --  3.0*  --   --  2.8*  ?AST  --  107*  --   --  100*  ?ALT  --  109*  --   --  119*  ?ALKPHOS  --  98  --   --  116  ?BILITOT  --  1.0  --   --  1.7*  ?GFRNONAA 7*  --   --  7* 8*  ?ANIONGAP 16*  --   --  13 12  ?  ? ?  Hematology ?Recent Labs  ?Lab 05/12/21 ?1124 05/12/21 ?2010 05/12/21 ?2259 05/13/21 ?4503 05/13/21 ?0844  ?WBC 13.4*  --   --  11.2* 11.0*  ?RBC 2.32*  --   --  3.04* 3.10*  ?HGB 6.9*   < > 8.7* 8.7* 9.0*  ?HCT 20.9*   < > 27.1* 26.3* 26.9*  ?MCV 90.1  --   --  86.5 86.8  ?MCH 29.7  --   --  28.6 29.0  ?MCHC 33.0  --   --  33.1 33.5  ?RDW 13.1  --   --  15.4 15.5  ?PLT 257  --   --  240 248  ? < > = values in this interval not displayed.  ? ? ?Cardiac EnzymesNo results for input(s): TROPONINI in the last 168 hours. No results for input(s): TROPIPOC in the last 168 hours.  ? ?BNPNo results for input(s): BNP, PROBNP in the last 168 hours.  ? ?DDimer No results for input(s): DDIMER in the last 168 hours.  ? ?Radiology  ?  ?CT ANGIO ABDOMEN W &/OR WO CONTRAST ? ?Result Date: 05/13/2021 ?CLINICAL DATA:  Renal hemorrhage post biopsy EXAM: CT ANGIOGRAPHY ABDOMEN TECHNIQUE: Multidetector CT imaging of the abdomen was performed using the standard protocol during bolus administration of intravenous contrast. Multiplanar reconstructed images and MIPs were obtained and reviewed to evaluate the vascular anatomy.  RADIATION DOSE REDUCTION: This exam was performed according to the departmental dose-optimization program which includes automated exposure control, adjustment of the mA and/or kV according to patient size and/or use of iterative reconstruction technique. CONTRAST:  64m OMNIPAQUE IOHEXOL 350 MG/ML SOLN COMPARISON:  Renal stone protocol CT 05/12/2021 CT abdomen 05/08/2021 FINDINGS: VASCULAR Aorta: Atherosclerotic calcified and noncalcified plaque seen throughout the abdominal aorta without aneurysmal dilatation. Celiac: No significant stenosis. SMA: No significant stenosis. 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 Moderate stenosis of the left main renal artery. IMA: Moderate stenosis at the origin, otherwise patent. Inflow: Severe stenosis present at the origin of the right common iliac artery due to calcified plaque. Moderate stenosis at the origin of the left common iliac artery. Veins: No obvious venous abnormality within the limitations of this arterial phase study. Review of the MIP images confirms the above findings. NON-VASCULAR Lower chest: Trace bilateral pleural effusions, minimally increased since examination from 05/08/2021. Mild bibasilar atelectasis. Hepatobiliary: Diffuse hepatic steatosis. Nonspecific heterogeneous enhancement of the patent parenchyma. Gallbladder is contracted. No bile duct dilatation. Pancreas: Unremarkable. No pancreatic ductal dilatation or surrounding inflammatory changes. Spleen: Normal in size without focal abnormality. Adrenals/Urinary Tract: Adrenal glands are normal. Left subcapsular and perinephric hematoma is minimally increased in thickness compared to prior CT from 05/12/2021 now measuring up to 1.8 cm in thickness compared to 1.4 cm on prior exam. Left perinephric hematoma located adjacent to the lower pole is not significantly changed in size measuring approximately 12.4 x 7.0 x 10.0 cm on the current examination. There is no active  extravasation. Small rounded 9 mm hypodense structure seen in the anterior left lower pole. It is too small to fully characterize. Right kidney is normal in appearance. Stomach/Bowel: No dilated loops of bowel within the visualized abdomen. Lymphatic: No enlarged mesenteric or retroperitoneal lymph nodes. Other: No hernia. Musculoskeletal: No acute or significant osseous findings. IMPRESSION: VASCULAR 1. No active extravasation from the left kidney. 2. 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 Moderate stenosis of the proximal left main renal artery. 3. Severe stenosis at the origin of the right  common iliac artery. Moderate stenosis of the origin of the left common iliac artery. NON-VASCULAR 1. Mild interval increase in thickness left subcapsular hematoma measuring 1.8 cm compared to 1.4 cm on prior examination from 05/12/2021. 2. No significant interval change in size left perinephric hematoma which now measures 12.4 x 7.0 x 10.0 cm. 3. Diffuse hepatic steatosis. Nonspecific heterogeneous enhancement of the hepatic parenchyma. Electronically Signed   By: Miachel Roux M.D.   On: 05/13/2021 09:13  ? ?CT Renal Stone Study ? ?Result Date: 05/12/2021 ?CLINICAL DATA:  Hemorrhage following left renal biopsy on 05/08/2021. Continued pain. EXAM: CT ABDOMEN AND PELVIS WITHOUT CONTRAST TECHNIQUE: Multidetector CT imaging of the abdomen and pelvis was performed following the standard protocol without IV contrast. RADIATION DOSE REDUCTION: This exam was performed according to the departmental dose-optimization program which includes automated exposure control, adjustment of the mA and/or kV according to patient size and/or use of iterative reconstruction technique. COMPARISON:  CT abdomen 05/08/2021 FINDINGS: Lower chest: Mild scarring and/or atelectasis in the lung bases. No pleural effusion. Coronary atherosclerosis. Decreased attenuation of the blood pool compatible with anemia. Hepatobiliary: Punctate liver  calcifications. Collapsed gallbladder. No biliary dilatation. Pancreas: Unremarkable. Spleen: Unremarkable. Adrenals/Urinary Tract: Unremarkable adrenal glands. Right renal atrophy. There is a persistent subcapsular
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
## 2021-05-13 NOTE — Progress Notes (Signed)
Contacted by nephrologist regarding pt's need for out-pt HD at d/c. Attempted multiple times today to reach pt via phone but have been unsuccessful. Voicemail left requesting a return call. Will f/u with pt tomorrow if pt unable to return call this afternoon.  ? ?Melven Sartorius ?Renal Navigator ?[phone] ?

## 2021-05-13 NOTE — Progress Notes (Signed)
Received call in regards to renal biopsy result: ?-Advanced nodular diabetic glomerulosclerosis, RPS class 4 & arterionephrosclerosis ?-18/24 glomeruli identified were globally sclerosed ?-Severe interstitial fibrosis (approx 80%) and tubular atrophy ?-[place] red stain negative for amyloid ?-IF negative ?-No evidence of immune complex deposition ? ?Have discussed report with Dr. Royce Macadamia. ? ?Gean Quint, MD ?[facility] ?

## 2021-05-13 NOTE — Sedation Documentation (Signed)
Pt transported to room with this RN and IR tech.  ?

## 2021-05-13 NOTE — Progress Notes (Signed)
Contacted by CT tech, Lauren, regarding the concern of getting a CTA in the setting of CKD 5 progression to ESRD.  They would like to have a written consent for this imaging.   ? ?IR team had spoken to Dr. Royce Macadamia this morning who was okay with getting the CTA.  Lauren also reached out to Dario Guardian, who spoke with Dr. Marval Regal the nephrologist on-call. Dr. Marval Regal recommended repeat H&H and if Hgb higher than 7, hold off on the CTA. ?

## 2021-05-13 NOTE — Progress Notes (Shared)
? ?  Subjective: *** ? ?Objective: ? ?Vital signs in last 24 hours: ?Vitals:  ? 05/12/21 2100 05/13/21 0430 05/13/21 0515 05/13/21 0600  ?BP: (!) 171/95 (!) 145/78 (!) 168/103 (!) 167/98  ?Pulse: 97  94 92  ?Resp: 20 19  (!) 24  ?Temp:      ?TempSrc:      ?SpO2: 97%  93% 96%  ? ?*** ? ?Assessment/Plan: ? ?Principal Problem: ?  Acute blood loss anemia ? ?Symptomatic anemia ?Large retroperitoneal hematoma ?Troponemia ?The patient presents here with complaints of left lower abdominal and flank pain, hematuria, and dizziness s/p a recent renal biopsy.  Since arrival to the ED, he has been afebrile and hemodynamically stable with systolic BP's ranging in the 130s-150s.  On exam, there is severe guarding and tenderness to palpation of the left lower quadrant.  Labs are significant for hemoglobin of 6.9, WBC 13.4. UA shows moderate hemoglobin, large leukocytes, >50 WBCs, and many bacteria.  Troponins are elevated to the 1400s today though patient is without symptoms of chest pain and shortness of breath, likely from significant demand ischemia.  Imaging today shows a new large retroperitoneal hematoma, when compared to imaging from last Friday.  Interventional radiology has been consulted, and they plan on obtaining CTA to guide intervention (embolization). Patient is also currently receiving his second unit of PRBCs. ?-IR is on board, appreciate their recommendations ?-CT angio abdomen w/wo contrast prior to possible embolization ?-Follow-up posttransfusion H&H ?-Trend CBC ?-Trend troponins ?-Will obtain records from [facility] in the AM ? ?-trend cbc q8h ?  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  CKD most likely 2/2 poorly controlled type 2 diabetes and hypertension.  GFR prior to renal biopsy of 14, Cr at the time of  4.43.  Today, the patient's creatinine is 8.05, GFR 7.  Bicarb 16. K 5.7 though no EKG changes reflecting hyperkalemia.  Nephrology has been consulted, they are recommending dialysis.  IR will place a tunneled catheter tomorrow and afterwards will begin the process of setting the patient up for outpatient dialysis. ?-Nephrology is on board, appreciate their recommendations ?-IR is on board, appreciate their recommendations ?-N.p.o. at midnight for tunneled catheter tomorrow by IR ?-Trend BMP ?-Avoid nephrotoxic medications ?-Lokelma, NS, sodium bicarb, and insulin per nephrology ?  ?HTN ?The patient is on amlodipine 10 mg daily and Coreg 6.25 twice daily for his HTN. ?-Continue home medications ?  ?Poorly controlled type 2 diabetes ?Most recent A1c of 11.9 in January 2023.  At home, he has been on glargine 22 units nightly and NovoLog 11 units 3 times daily with meals. ?-Start glargine 15 units qhs ?-CBG monitoring ?-SSI with meals ?  ?H/o CHF, likely HFpEF ?Appears compensated. Most recent echo in 2015 showed normal EF with G1DD. ?-CTM ? ?Elevated transaminases ?AST 100, ALT 119, likely ischemic in the setting of acute severe blood loss. ?-Trend CMP ?  ?Prior to Admission Living Arrangement: ?Anticipated Discharge Location: ?Barriers to Discharge: ?Dispo: Anticipated discharge in approximately *** day(s).  ? ?Orvis Brill, MD ?05/13/2021, 7:57 AM ?Pager: [phone]  ?After 5pm on weekdays and 1pm on weekends: On Call pager [phone] ? ?

## 2021-05-13 NOTE — ED Notes (Signed)
Tawanna Sat (Sister) called asking for an update with questions about PT status. her number is [phone]. ?

## 2021-05-13 NOTE — Progress Notes (Signed)
[facility] ?Progress Note ? ?Name: [name] ?MRN: [id number] ?DOB: 09/22/1960 ? ?Chief Complaint:  ?Flank pain ? ?Subjective:  ?cardiology felt no role for inpatient ischemic evaluation; they feel severe acute anemia prompted the rise in troponin.  Strict ins/outs not available and no urine output is charted.   He feels ok today.  Still some abd discomfort.  He can't tell me if he has an internal foley in (it's a condom cath).  ? ?Review of systems:  ?Denies shortness of breath or chest pain ?Some abd discomfort  ?No n/v ? ? ?Intake/Output Summary (Last 24 hours) at 05/13/2021 0900 ?Last data filed at 05/12/2021 2305 ?Gross per 24 hour  ?Intake 2230 ml  ?Output --  ?Net 2230 ml  ? ? ?Vitals:  ?Vitals:  ? 05/13/21 0430 05/13/21 0515 05/13/21 0600 05/13/21 0847  ?BP: (!) 145/78 (!) 168/103 (!) 167/98 (!) 162/84  ?Pulse:  94 92 94  ?Resp: 19  (!) 24 (!) 23  ?Temp:    98.3 ?F (36.8 ?C)  ?TempSrc:    Oral  ?SpO2:  93% 96% 100%  ?  ? ?Physical Exam:  ?General: adult male in stretcher in NAD  ?HEENT: NCAT ?Eyes: EOMI; sclera anicteric ?Neck: supple trachea midline  ?Heart: S1S2 no rub ?Lungs: clear and unlabored; normal work of breathing at rest; on room air ?Abdomen: soft/tender on palpation; somewhat distended; obese habitus ?Extremities: no edema left leg; right BKA with no residual limb edema appreciated ?Skin: no rash on extremities exposed ?Neuro: alert and oriented x 3 provides hx and follows commands  ?Psych normal mood and affect ?GU condom catheter in place  ? ?Medications reviewed    ? ?Labs:  ? ?  Latest Ref Rng & Units 05/13/2021  ?  3:12 AM 05/12/2021  ?  6:07 PM 05/12/2021  ?  1:03 PM  ?BMP  ?Glucose 70 - 99 mg/dL 250   269   303    ?BUN 8 - 23 mg/dL 69   71     ?Creatinine 0.61 - 1.24 mg/dL 7.43   7.71     ?Sodium 135 - 145 mmol/L 132   129     ?Potassium 3.5 - 5.1 mmol/L 5.0   5.0     ?Chloride 98 - 111 mmol/L 101   99     ?CO2 22 - 32 mmol/L 19   17     ?Calcium 8.9 - 10.3 mg/dL 7.8   7.6      ? ? ? ?Assessment/Plan:  ? 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  CKD felt most likely 2/2 DM and HTN.  Now with Cr 8.  I do feel that he has progressed to end-stage renal disease and the need for dialysis ?- Plan for tunneled catheter with IR  ?- Cardiology does not feel inpatient ischemic work-up is needed  ?- will call HD SW to start CLIP process to set up outpatient dialysis unit; she is currently in a meeting ? ?# Anemia Acute blood loss  ?- Retroperitoneal hematoma status post kidney biopsy ?- s/p PRBC's ?- Start ESA - aranesp 40 mcg weekly on Wednesdays.  He and I discussed risks/benefits/indications for ESA ?- check iron stores  ?  ?# Retroperitoneal hematoma ?- IR is consulted had  CTA this am to guide intervention - no read yet  ?  ?# Hyperkalemia  ?-temporized  ?- will need Renal diet when taking PO  ?  ?#  Metabolic acidosis ?- 2/2 advanced CKD/ESRD ?- starting HD  ?  ?# HTN  ?- starting HD ?- resume home meds  ?  ?# Type 2 DM  ?- Uncontrolled  ?- per primary team  ?  ?# Elevated troponin ?- Per primary team  ?- Needs AVF eventually once medically cleared for same - per cardiology no further inpatient work-up indicated ?  ?# Metabolic bone disease ?- intact PTH - pending and mild hyperphos - manage with HD for now  ?  ?Disposition - continue inpatient monitoring ? ?Claudia Desanctis, MD ?05/13/2021 9:32 AM ? ? ? ?

## 2021-05-13 NOTE — Progress Notes (Signed)
? ?Brief Hospital Narrative:  ? ?[name] is a 61 y.o. 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 He was found to have post procedure Left subcapsular and perinephric hematoma with Hgb of 6.9 on presentation down from 11.9 ~ 4d prior to admission. He was given two units of pRBCs, he remained HDS. Given his CKD IV now with progression to ESRD, nephrology was consulted with plans for tunneled catheter and HD this admission.  ? ?Subjective:  ?Continues to have some pain in his back and side. He denies any dysuria. He has an appetite. No N/V.  ? ?Objective: ? ?Vital signs in last 24 hours: ?Vitals:  ? 05/13/21 0515 05/13/21 0600 05/13/21 0847 05/13/21 1015  ?BP: (!) 168/103 (!) 167/98 (!) 162/84 (!) 171/80  ?Pulse: 94 92 94 90  ?Resp:  (!) 24 (!) 23 20  ?Temp:   98.3 ?F (36.8 ?C)   ?TempSrc:   Oral   ?SpO2: 93% 96% 100% 96%  ? ? ?Constitutional: Chronically ill appearing and in no distress.  ?HENT:  ?Head: Normocephalic and atraumatic.  ?Eyes: EOM are normal.  ?Neck: Normal range of motion.  ?Cardiovascular: Normal rate, regular rhythm, intact distal pulses. No gallop and no friction rub.  ?No murmur heard. No lower extremity edema  ?Pulmonary: Non labored breathing on room air, no wheezing or rales  ?Abdominal: Soft. Normal bowel sounds. Non distended and non tender ?Musculoskeletal: Normal range of motion. S/p R BKA, s/p left transmetatarsal amputation  ?   General: No tenderness or edema.  ?Neurological: Alert and oriented to person, place, and time. Non focal  ?Skin: Skin is warm and dry. There are small pressure injuries on the lateral aspect of the LLE and at most distal aspect of his RLE. ? ? ?Assessment/Plan: ? ?Principal Problem: ?  Acute blood loss anemia ? ?#Large retroperitoneal hematoma  ?#Symptomatic anemia  ?Patient developed left lower abdominal pain, flank  pain, hematuria, and dizziness after a renal biopsy 5/5.  Imaging here at the hospital showed large retroperitoneal hematoma.  He underwent a CT a of the abdomen and pelvis which showed no evidence of a potential intervenable target for embolization.  His hemoglobin stabilized after 2 units of packed red blood cells. ?-Continue to monitor patient's blood counts every 12 hours ? 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  It appears his CKD is secondary to his poorly controlled diabetes and hypertension.  He will require dialysis this hospitalization and there are plans for placement of a tunneled catheter. ?-Remain n.p.o. until tunneled line placed ?-Follow-up nephrology recommendations ?-Avoid nephrotoxic medications ?-Potassium normal this AM  ? ?#Type 2 NSTEMI  ?Patient had elevated troponins. ?Component Ref Range & Units 08:44 ?(05/13/21) 03:12 ?(05/13/21) 1 d ago ?(05/12/21) 1 d ago ?(05/12/21) 1 d ago ?(05/12/21) 1 d ago ?(05/12/21) 2 yr ago ?(03/30/19)  ?Troponin I (High Sensitivity) <18 ng/L 1,330 High Panic   1,743 High Panic  CM  1,540 High Panic  CM  1,529 High Panic  CM  1,452 High Panic  CM  1,425 High Panic  CM  16 CM  ? ?His EKG did show ST depression and T wave inversions and lateral leads as well as inferior leads.  This appears to be chronic however.  Given his acute blood loss anemia  he was thought to have a type II NSTEMI.  Cardiology was consulted overnight and agree with this.  Do not feel that patient requires ischemic evaluation while inpatient. ?-F/u transthoracic echocardiogram ? ?#Ischemic Hepatitis ?Patient also noted to have elevated transaminases on admission.  This is also likely secondary to patient's acute blood loss anemia.  No laboratory evidence of acute liver failure. ?-Continue to monitor  ? ?#HTN ?Blood pressure elevated this AM to 701I systolic. His home medications fo coreg and amlodipine were resumed this AM.  He does not appear hypervolemic on exam.  ?-Continue to monitor ? ?#Poorly controlled type 2 diabetes ?Patient most recent A1c 11.9 (01/2021).  At home he takes glargine 22 units nightly and 11 units 3 times daily with meals.  Patient was started on reduced dose of his long-acting insulin given his n.p.o. status.  We will resume his full long-acting dose this p.m. as well as his home mealtime dose. ?CBG (last 3)  ?Recent Labs  ?  05/12/21 ?2353 05/13/21 ?0806 05/13/21 ?1248  ?GLUCAP 247* 215* 185*  ? ?H/o CHF, likely HFpEF ?Appears compensated on exam.  Most recent echo in 2015 showed normal EF with findings concerning G1DD. ?-CTM ? ?Prior to Admission Living Arrangement: Home with home health ?Anticipated Discharge Location: Home  ?Barriers to Discharge: States he requiring more assistance to help care for himself.  ?Dispo: Anticipated discharge in approximately 5 day(s).  ? ?Rick Duff, MD ?05/13/2021, 12:18 PM ?After 5pm on weekdays and 1pm on weekends: On Call pager [phone]  ?

## 2021-05-13 NOTE — ED Notes (Signed)
Patient removed condom cath. Patient provided with linen and brief change and warm blankets. Patient repositioned in bed and resting comfortably. VSS. No other complaints at this time ?

## 2021-05-13 NOTE — Evaluation (Signed)
Physical Therapy Evaluation ?Patient Details ?Name: [name] ?MRN: [id number] ?DOB: [birth year] ?Today's Date: 05/13/2021 ? ?History of Present Illness ? Pt is a 61 y.o. M who presents with fatigue, hematuria, and L flank pain. Pt had L renal biopsy 5/5; developed hematuria and abdominal/back pain around the left kidney 5/9. CT scan revealed a large RP hematoma; Hgb 7 from baseline 12. Also developed Type II NSTEMI due to acute severe anemia.  Significant medical history of PAD s/p stent and left midfoot amputation, right BKA,  DM, HTN, CKD5 p/w RP bleed following renal biopsy  ?Clinical Impression ? PTA, pt reports he lives alone in an apartment with two steps to enter, uses a cane for ambulation and is independent with ADL's. Pt presents with impaired cognition, although is following all commands well; reports feeling "out of it," because he has not had any food or drink. Pt repetitively asking for something to drink and reminded him of his NPO status for upcoming procedure. Pt noted to have episode of bowel incontinence. Assisted to standing position with right prosthetic donned and min guard assist to provide peri care and pad change. Pt requesting to lie back down due to fatigue. Suspect pt will progress well once cognition clears. Will progress mobility as tolerated. ?   ? ?Recommendations for follow up therapy are one component of a multi-disciplinary discharge planning process, led by the attending physician.  Recommendations may be updated based on patient status, additional functional criteria and insurance authorization. ? ?Follow Up Recommendations Home health PT (would also benefit from [facility] aide) ? ?  ?Assistance Recommended at Discharge Frequent or constant Supervision/Assistance  ?Patient can return home with the following ? A little help with walking and/or transfers;Assistance with cooking/housework;Direct supervision/assist for medications management;Direct supervision/assist for financial  management;Assist for transportation;Help with stairs or ramp for entrance ? ?  ?Equipment Recommendations Rolling walker (2 wheels)  ?Recommendations for Other Services ?    ?  ?Functional Status Assessment Patient has had a recent decline in their functional status and demonstrates the ability to make significant improvements in function in a reasonable and predictable amount of time.  ? ?  ?Precautions / Restrictions Precautions ?Precautions: Fall;Other (comment) ?Precaution Comments: R BKA (has prosthetic in room) ?Restrictions ?Weight Bearing Restrictions: No  ? ?  ? ?Mobility ? Bed Mobility ?Overal bed mobility: Needs Assistance ?Bed Mobility: Supine to Sit, Sit to Supine ?  ?  ?Supine to sit: Supervision ?Sit to supine: Supervision ?  ?General bed mobility comments: close supervision, no physical assist required ?  ? ?Transfers ?Overall transfer level: Needs assistance ?Equipment used: Rolling walker (2 wheels) ?Transfers: Sit to/from Stand ?Sit to Stand: Min guard ?  ?  ?  ?  ?  ?General transfer comment: Min guard for safety with R prosthetic donned, elevated bed height ?  ? ?Ambulation/Gait ?  ?  ?  ?  ?  ?  ?  ?General Gait Details: deferred due to fatigue ? ?Stairs ?  ?  ?  ?  ?  ? ?Wheelchair Mobility ?  ? ?Modified Rankin (Stroke Patients Only) ?  ? ?  ? ?Balance Overall balance assessment: Needs assistance ?Sitting-balance support: Feet supported ?Sitting balance-Leahy Scale: Good ?Sitting balance - Comments: Donning prosthetic EOB ?  ?Standing balance support: Bilateral upper extremity supported ?Standing balance-Leahy Scale: Poor ?Standing balance comment: reliant on BUE support ?  ?  ?  ?  ?  ?  ?  ?  ?  ?  ?  ?   ? ? ? ?  Pertinent Vitals/Pain Pain Assessment ?Pain Assessment: No/denies pain  ? ? ?Home Living Family/patient expects to be discharged to:: Private residence ?Living Arrangements: Alone ?Available Help at Discharge: Family ?Type of Home: Apartment ?Home Access: Stairs to enter ?   ?Entrance Stairs-Number of Steps: 2 (in front) ?  ?Home Layout: One level ?Home Equipment: Cane - single point;Rollator (4 wheels) ?   ?  ?Prior Function Prior Level of Function : Needs assist ?  ?  ?  ?  ?  ?  ?Mobility Comments: using cane ?ADLs Comments: reports increased difficulty with ADL's ?  ? ? ?Hand Dominance  ?   ? ?  ?Extremity/Trunk Assessment  ? Upper Extremity Assessment ?Upper Extremity Assessment: Defer to OT evaluation ?  ? ?Lower Extremity Assessment ?Lower Extremity Assessment: Generalized weakness ?  ? ?Cervical / Trunk Assessment ?Cervical / Trunk Assessment: Normal  ?Communication  ? Communication: No difficulties  ?Cognition Arousal/Alertness: Awake/alert ?Behavior During Therapy: [facility] for tasks assessed/performed ?Overall Cognitive Status: Impaired/Different from baseline ?Area of Impairment: Memory, Safety/judgement, Awareness ?  ?  ?  ?  ?  ?  ?  ?  ?  ?  ?Memory: Decreased short-term memory ?  ?Safety/Judgement: Decreased awareness of safety, Decreased awareness of deficits ?Awareness: Intellectual ?  ?General Comments: Pt repetitively asking for fluids/food despite being NPO for upcoming tunneled catheter procedure. Had episode of bowel incontinence and reports he was aware, however, had not called out for help ?  ?  ? ?  ?General Comments   ? ?  ?Exercises    ? ?Assessment/Plan  ?  ?PT Assessment Patient needs continued PT services  ?PT Problem List Decreased strength;Decreased activity tolerance;Decreased balance;Decreased mobility;Decreased cognition;Decreased safety awareness ? ?   ?  ?PT Treatment Interventions DME instruction;Gait training;Functional mobility training;Therapeutic activities;Therapeutic exercise;Stair training;Balance training;Patient/family education   ? ?PT Goals (Current goals can be found in the Care Plan section)  ?Acute Rehab PT Goals ?Patient Stated Goal: get something to drink ?PT Goal Formulation: With patient ?Time For Goal Achievement: 05/27/21 ?Potential  to Achieve Goals: Fair ? ?  ?Frequency Min 3X/week ?  ? ? ?Co-evaluation   ?  ?  ?  ?  ? ? ?  ?AM-PAC PT "6 Clicks" Mobility  ?Outcome Measure Help needed turning from your back to your side while in a flat bed without using bedrails?: A Little ?Help needed moving from lying on your back to sitting on the side of a flat bed without using bedrails?: A Little ?Help needed moving to and from a bed to a chair (including a wheelchair)?: A Little ?Help needed standing up from a chair using your arms (e.g., wheelchair or bedside chair)?: A Little ?Help needed to walk in hospital room?: A Lot ?Help needed climbing 3-5 steps with a railing? : Total ?6 Click Score: 15 ? ?  ?End of Session Equipment Utilized During Treatment: Gait belt ?Activity Tolerance: Patient limited by fatigue ?Patient left: in bed;with call bell/phone within reach;with bed alarm set ?Nurse Communication: Mobility status ?PT Visit Diagnosis: Difficulty in walking, not elsewhere classified (R26.2);Muscle weakness (generalized) (M62.81) ?  ? ?Time: 8250-5397 ?PT Time Calculation (min) (ACUTE ONLY): 22 min ? ? ?Charges:   PT Evaluation ?$PT Eval Low Complexity: 1 Low ?  ?  ?   ? ? ?Wyona Almas, PT, DPT ?Acute Rehabilitation Services ?Pager [phone] ?Office [phone] ? ? ?Carloine Margo Aye ?05/13/2021, 4:27 PM ? ?

## 2021-05-13 NOTE — Progress Notes (Signed)
NEW ADMISSION NOTE ?New Admission Note:  ? ?Arrival Method: Stretcher ?Mental Orientation:  A & O x4 ?Telemetry: no ?Assessment: Completed ?Skin:  intact right BKA left foot partial foot amputation ?IV: Left and Right AC ?Pain: Denies ?Tubes: none ?Safety Measures: Safety Fall Prevention Plan has been given, discussed and signed ?Admission: Completed ?[facility] Orientation: Patient has been orientated to the room, unit and staff.  ?Family: not present ? ?Orders have been reviewed and implemented. Will continue to monitor the patient. Call light has been placed within reach and bed alarm has been activated.  ? ?Berneta Levins, RN   ?
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
## 2021-05-14 DIAGNOSIS — N186 End stage renal disease: Secondary | ICD-10-CM | POA: Diagnosis not present

## 2021-05-14 DIAGNOSIS — D62 Acute posthemorrhagic anemia: Secondary | ICD-10-CM | POA: Diagnosis not present

## 2021-05-14 DIAGNOSIS — K661 Hemoperitoneum: Secondary | ICD-10-CM | POA: Diagnosis not present

## 2021-05-14 DIAGNOSIS — I10 Essential (primary) hypertension: Secondary | ICD-10-CM

## 2021-05-14 DIAGNOSIS — Z992 Dependence on renal dialysis: Secondary | ICD-10-CM | POA: Diagnosis not present

## 2021-05-14 DIAGNOSIS — N185 Chronic kidney disease, stage 5: Secondary | ICD-10-CM

## 2021-05-14 DIAGNOSIS — I509 Heart failure, unspecified: Secondary | ICD-10-CM

## 2021-05-14 DIAGNOSIS — I2510 Atherosclerotic heart disease of native coronary artery without angina pectoris: Secondary | ICD-10-CM

## 2021-05-14 LAB — GLUCOSE, CAPILLARY
Glucose-Capillary: 169 mg/dL — ABNORMAL HIGH (ref 70–99)
Glucose-Capillary: 235 mg/dL — ABNORMAL HIGH (ref 70–99)
Glucose-Capillary: 239 mg/dL — ABNORMAL HIGH (ref 70–99)
Glucose-Capillary: 161 mg/dL — ABNORMAL HIGH (ref 70–99)

## 2021-05-14 LAB — COMPREHENSIVE METABOLIC PANEL
ALT: 71 U/L — ABNORMAL HIGH (ref 0–44)
AST: 39 U/L (ref 15–41)
Albumin: 2.5 g/dL — ABNORMAL LOW (ref 3.5–5.0)
Alkaline Phosphatase: 94 U/L (ref 38–126)
Anion gap: 9 (ref 5–15)
BUN: 39 mg/dL — ABNORMAL HIGH (ref 8–23)
CO2: 23 mmol/L (ref 22–32)
Calcium: 7.8 mg/dL — ABNORMAL LOW (ref 8.9–10.3)
Chloride: 101 mmol/L (ref 98–111)
Creatinine, Ser: 4.69 mg/dL — ABNORMAL HIGH (ref 0.61–1.24)
GFR, Estimated: 13 mL/min — ABNORMAL LOW (ref >60)
Glucose, Bld: 218 mg/dL — ABNORMAL HIGH (ref 70–99)
Potassium: 3.5 mmol/L (ref 3.5–5.1)
Sodium: 133 mmol/L — ABNORMAL LOW (ref 135–145)
Total Bilirubin: 1.3 mg/dL — ABNORMAL HIGH (ref 0.3–1.2)
Total Protein: 6.4 g/dL — ABNORMAL LOW (ref 6.5–8.1)

## 2021-05-14 LAB — CBC
HCT: 26.6 % — ABNORMAL LOW (ref 39.0–52.0)
Hemoglobin: 8.8 g/dL — ABNORMAL LOW (ref 13.0–17.0)
MCH: 28.6 pg (ref 26.0–34.0)
MCHC: 33.1 g/dL (ref 30.0–36.0)
MCV: 86.4 fL (ref 80.0–100.0)
Platelets: 233 10*3/uL (ref 150–400)
RBC: 3.08 MIL/uL — ABNORMAL LOW (ref 4.22–5.81)
RDW: 15.1 % (ref 11.5–15.5)
WBC: 10.9 10*3/uL — ABNORMAL HIGH (ref 4.0–10.5)
nRBC: 0.2 % (ref 0.0–0.2)

## 2021-05-14 LAB — MRSA NEXT GEN BY PCR, NASAL: MRSA by PCR Next Gen: NOT DETECTED

## 2021-05-14 LAB — PARATHYROID HORMONE, INTACT (NO CA): PTH: 190 pg/mL — ABNORMAL HIGH (ref 15–65)

## 2021-05-14 MED ORDER — EZETIMIBE 10 MG PO TABS
10.0000 mg | ORAL_TABLET | Freq: Every day | ORAL | Status: DC
  Administered 2021-05-14 – 2021-05-20 (×7): 10 mg via ORAL
  Filled 2021-05-14 (×7): qty 1

## 2021-05-14 MED ORDER — CHLORHEXIDINE GLUCONATE CLOTH 2 % EX PADS
6.0000 | MEDICATED_PAD | Freq: Every day | CUTANEOUS | Status: DC
  Administered 2021-05-15 – 2021-05-17 (×3): 6 via TOPICAL

## 2021-05-14 MED ORDER — ISOSORBIDE MONONITRATE ER 30 MG PO TB24
15.0000 mg | ORAL_TABLET | Freq: Every day | ORAL | Status: DC
  Administered 2021-05-14 – 2021-05-20 (×8): 15 mg via ORAL
  Filled 2021-05-14 (×8): qty 1

## 2021-05-14 MED ORDER — NICOTINE 14 MG/24HR TD PT24
14.0000 mg | MEDICATED_PATCH | Freq: Every day | TRANSDERMAL | Status: DC
  Administered 2021-05-14 – 2021-05-20 (×7): 14 mg via TRANSDERMAL
  Filled 2021-05-14 (×7): qty 1

## 2021-05-14 MED ORDER — DARBEPOETIN ALFA 40 MCG/0.4ML IJ SOSY: 40.0000 ug | PREFILLED_SYRINGE | INTRAMUSCULAR | Status: DC

## 2021-05-14 NOTE — Plan of Care (Signed)
?  Problem: Health Behavior/Discharge Planning: ?Goal: Ability to manage health-related needs will improve ?Outcome: Progressing ?  ?Problem: Nutrition: ?Goal: Adequate nutrition will be maintained ?Outcome: Progressing ?  ?Problem: Activity: ?Goal: Risk for activity intolerance will decrease ?Outcome: Progressing ?  ?

## 2021-05-14 NOTE — Evaluation (Signed)
Occupational Therapy Evaluation ?Patient Details ?Name: [name] ?MRN: [id number] ?DOB: [birth year] ?Today's Date: 05/14/2021 ? ? ?History of Present Illness Pt is a 61 y.o. M who presents with fatigue, hematuria, and L flank pain. Pt had L renal biopsy 5/5; developed hematuria and abdominal/back pain around the left kidney 5/9. CT scan revealed a large RP hematoma; Hgb 7 from baseline 12. Also developed Type II NSTEMI due to acute severe anemia.  Now on HD with significant medical history of PAD s/p stent and left midfoot amputation, right BKA,  DM, HTN, CKD5 p/w RP bleed following renal biopsy  ? ?Clinical Impression ?  ?Pt currently at min assist for selfcare tasks and functional transfers with use of the RW secondary to weakness and fatigue.  Feel he will benefit from acute care OT to help increase strength and ADL independence in order to progress back toward modified independent level.   Recommend SNF for follow-up rehab since pt lives alone and will not have initial 24 hr supervision.     ? ?Recommendations for follow up therapy are one component of a multi-disciplinary discharge planning process, led by the attending physician.  Recommendations may be updated based on patient status, additional functional criteria and insurance authorization.  ? ?Follow Up Recommendations ? Skilled nursing-short term rehab (<3 hours/day)  ?  ?Assistance Recommended at Discharge Frequent or constant Supervision/Assistance  ?Patient can return home with the following A little help with walking and/or transfers;A little help with bathing/dressing/bathroom;Assistance with cooking/housework;Assist for transportation;Help with stairs or ramp for entrance ? ?  ?Functional Status Assessment ? Patient has had a recent decline in their functional status and demonstrates the ability to make significant improvements in function in a reasonable and predictable amount of time.  ?Equipment Recommendations ? None recommended by OT  ?  ?    ?Precautions / Restrictions Precautions ?Precautions: Fall;Other (comment) ?Precaution Comments: R BKA (has prosthetic in room), hx of L forefoot amputation ?Restrictions ?Weight Bearing Restrictions: No  ? ?  ? ?Mobility Bed Mobility ?Overal bed mobility: Needs Assistance ?Bed Mobility: Supine to Sit ?  ?  ?Supine to sit: Min assist ?  ?  ?General bed mobility comments: Pt reached out for therapist to assist with helping him pull up to sitting. ?  ? ?Transfers ?Overall transfer level: Needs assistance ?Equipment used: Rolling walker (2 wheels) ?Transfers: Sit to/from Stand ?Sit to Stand: Min assist ?  ?  ?  ?  ?  ?General transfer comment: Min assist with RLE prosthesis in place and shoe over the LLE. ?  ? ?  ?Balance Overall balance assessment: Needs assistance ?Sitting-balance support: Feet supported ?Sitting balance-Leahy Scale: Good ?  ?  ?Standing balance support: Bilateral upper extremity supported, Reliant on assistive device for balance ?Standing balance-Leahy Scale: Poor ?Standing balance comment: Pt needs use of an assistive device for UE support. ?  ?  ?  ?  ?  ?  ?  ?  ?  ?  ?  ?   ? ?ADL either performed or assessed with clinical judgement  ? ?ADL Overall ADL's : Needs assistance/impaired ?Eating/Feeding: Independent;Sitting ?  ?Grooming: Wash/dry hands;Wash/dry face;Sitting ?Grooming Details (indicate cue type and reason): simulated ?Upper Body Bathing: Set up;Sitting ?Upper Body Bathing Details (indicate cue type and reason): simulated ?Lower Body Bathing: Minimal assistance;Sit to/from stand ?  ?Upper Body Dressing : Supervision/safety ?  ?Lower Body Dressing: Minimal assistance ?  ?Toilet Transfer: Minimal assistance;BSC/3in1;Rolling walker (2 wheels) ?Toilet Transfer Details (indicate cue type and  reason): simulated ?Toileting- Clothing Manipulation and Hygiene: Minimal assistance;Sit to/from stand ?  ?  ?  ?Functional mobility during ADLs: Minimal assistance;Rolling walker (2 wheels) ?General  ADL Comments: Pt very fatigued this session, laying in bed with RLE prosthesis in place and feet sitting over the end of the bed.  He was agreeable to OOB but needed increased time and encouragement for supine to sit secondary to fatigue.  Pt's sister calling him on the phone and spoke to this therapist about request to go to rehab as pt lives by himself and has been having difficulty with ADLs.  Now with new HD as well.  ? ? ? ?Vision Baseline Vision/History: 0 No visual deficits (per pt report) ?Ability to See in Adequate Light: 0 Adequate ?Patient Visual Report: No change from baseline ?Vision Assessment?: No apparent visual deficits  ?   ?Perception Perception ?Perception: Within Functional Limits ?  ?Praxis Praxis ?Praxis: Intact ?  ? ?Pertinent Vitals/Pain Pain Assessment ?Pain Assessment: No/denies pain  ? ? ? ?Hand Dominance Right ?  ?Extremity/Trunk Assessment Upper Extremity Assessment ?Upper Extremity Assessment: Generalized weakness (strength overall 3+/5 in shoulders and hands, 4/5 in biceps and triceps.  Pt reports history of pain in his hands and wears fingerless gloves to help with this.) ?  ?  ?  ?Cervical / Trunk Assessment ?Cervical / Trunk Assessment: Normal ?  ?Communication Communication ?Communication: No difficulties ?  ?Cognition Arousal/Alertness: Lethargic ?Behavior During Therapy: Flat affect ?Overall Cognitive Status: Impaired/Different from baseline ?Area of Impairment: Safety/judgement, Awareness (pt reported getting up by himself as long as the RW was close when asked what he would do if he got tired of sitting in his recliner) ?  ?  ?  ?  ?  ?  ?  ?  ?  ?  ?Memory: Decreased short-term memory (Pt reports forgetting stuff all the time) ?  ?  ?Awareness: Emergent ?  ?General Comments: Pt reports being weaker and having more trouble at home with activities. ?  ?  ?   ?   ?   ? ? ?Home Living Family/patient expects to be discharged to:: Private residence ?Living Arrangements:  Alone ?Available Help at Discharge: Family (daughter and sons can help out some but not 24hr) ?Type of Home: Apartment ?Home Access: Stairs to enter ?Entrance Stairs-Number of Steps: 2 (in front) ?  ?Home Layout: One level ?  ?  ?Bathroom Shower/Tub: Tub/shower unit ?  ?  ?  ?  ?Home Equipment: Kasandra Knudsen - single point;Tub bench;Other (comment);Rolling Walker (2 wheels) (toilet riser) ?  ?  ?  ? ?  ?Prior Functioning/Environment Prior Level of Function : Needs assist (needed asssit with some meals, washing clothes, cleaning up) ?  ?  ?  ?  ?  ?  ?Mobility Comments: using cane ?ADLs Comments: reports increased difficulty with ADL's ?  ? ?  ?  ?OT Problem List: Decreased strength;Decreased activity tolerance;Impaired balance (sitting and/or standing);Decreased safety awareness ?  ?   ?OT Treatment/Interventions: Self-care/ADL training;DME and/or AE instruction;Balance training;Patient/family education;Therapeutic activities;Cognitive remediation/compensation;Therapeutic exercise;Energy conservation  ?  ?OT Goals(Current goals can be found in the care plan section) Acute Rehab OT Goals ?Patient Stated Goal: Pt wants to get stronger. ?OT Goal Formulation: With patient ?Time For Goal Achievement: 05/28/21 ?Potential to Achieve Goals: Good  ?OT Frequency: Min 2X/week ?  ? ?   ?AM-PAC OT "6 Clicks" Daily Activity     ?Outcome Measure Help from another person eating meals?: None ?Help from another  person taking care of personal grooming?: A Little ?Help from another person toileting, which includes using toliet, bedpan, or urinal?: A Little ?Help from another person bathing (including washing, rinsing, drying)?: A Little ?Help from another person to put on and taking off regular upper body clothing?: None ?Help from another person to put on and taking off regular lower body clothing?: A Little ?6 Click Score: 20 ?  ?End of Session Equipment Utilized During Treatment: Rolling walker (2 wheels) ?Nurse Communication: Mobility  status ? ?Activity Tolerance: Patient limited by fatigue ?Patient left: in chair;with call bell/phone within reach;with chair alarm set ? ?OT Visit Diagnosis: Unsteadiness on feet (R26.81);Muscle weakness (generalized) (M
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
## 2021-05-14 NOTE — Progress Notes (Shared)
? ?Brief Hospital Narrative:  ? ?[name] is a 61 y.o. 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 He was found to have post procedure Left subcapsular and perinephric hematoma with Hgb of 6.9 on presentation down from 11.9 ~ 4d prior to admission. He was given two units of pRBCs, he remained HDS. Given his CKD IV now with progression to ESRD, nephrology was consulted with plans for tunneled catheter and HD this admission.  ? ?Subjective:  ?Continues to have some pain in his back and side. He denies any dysuria. He has an appetite. No N/V.  ? ?Objective: ? ?Vital signs in last 24 hours: ?Vitals:  ? 05/14/21 0400 05/14/21 0415 05/14/21 0417 05/14/21 0519  ?BP: (!) 142/72 134/71 134/69 133/68  ?Pulse: 76 76 76 77  ?Resp:   18 18  ?Temp:   98.2 ?F (36.8 ?C) 97.6 ?F (36.4 ?C)  ?TempSrc:   Oral Oral  ?SpO2:   100% 95%  ?Weight:   81.7 kg   ? ? ?Constitutional: Chronically ill appearing and in no distress.  ?HENT:  ?Head: Normocephalic and atraumatic.  ?Eyes: EOM are normal.  ?Neck: Normal range of motion.  ?Cardiovascular: Normal rate, regular rhythm, intact distal pulses. No gallop and no friction rub.  ?No murmur heard. No lower extremity edema  ?Pulmonary: Non labored breathing on room air, no wheezing or rales  ?Abdominal: Soft. Normal bowel sounds. Non distended and non tender ?Musculoskeletal: Normal range of motion. S/p R BKA, s/p left transmetatarsal amputation  ?   General: No tenderness or edema.  ?Neurological: Alert and oriented to person, place, and time. Non focal  ?Skin: Skin is warm and dry. There are small pressure injuries on the lateral aspect of the LLE and at most distal aspect of his RLE. ? ?Postprocedural retroperitoneal hematoma and recent progression to ESRD, s/p tunneled catheter placement by IR yesterday ?ON: NAE ?Afebrile and HDS ? ?AM labs are  pending, last Hgb yesterday AM was 9 ? ? ?Plan ?ESRD - starting the CLIP process ?Hematoma - no target for intervention per imaging, follow-up on AM labs once they come back ?PT recommending home health PT ? ? ? ? ?Assessment/Plan: ? ?Principal Problem: ?  Acute blood loss anemia ? ?#Large retroperitoneal hematoma  ?#Symptomatic anemia  ?Patient developed left lower abdominal pain, flank pain, hematuria, and dizziness after a renal biopsy 5/5.  Imaging here at the hospital showed large retroperitoneal hematoma.  He underwent a CT a of the abdomen and pelvis which showed no evidence of a potential intervenable target for embolization.  His hemoglobin stabilized after 2 units of packed red blood cells. ?-Continue to monitor patient's blood counts every 12 hours ? 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  It appears his CKD is secondary to his poorly controlled diabetes and hypertension.  He will require dialysis this hospitalization and there are plans for placement of a tunneled catheter. ?-Remain n.p.o. until tunneled line placed ?-Follow-up nephrology recommendations ?-Avoid nephrotoxic medications ?-Potassium normal this AM  ? ?#Type 2 NSTEMI  ?Patient had elevated troponins. ?Component Ref Range & Units 08:44 ?(05/13/21) 03:12 ?(05/13/21) 1 d ago ?(05/12/21) 1 d ago ?(05/12/21) 1 d ago ?(05/12/21) 1 d ago ?(05/12/21) 2 yr ago ?(03/30/19)  ?Troponin I (High Sensitivity) <18 ng/L 1,330 High  Panic   1,743 High Panic  CM  1,540 High Panic  CM  1,529 High Panic  CM  1,452 High Panic  CM  1,425 High Panic  CM  16 CM  ? ?His EKG did show ST depression and T wave inversions and lateral leads as well as inferior leads.  This appears to be chronic however.  Given his acute blood loss anemia he was thought to have a type II NSTEMI.  Cardiology was consulted overnight and agree with this.  Do not feel that patient requires ischemic evaluation while  inpatient. ?-F/u transthoracic echocardiogram ? ?#Ischemic Hepatitis ?Patient also noted to have elevated transaminases on admission.  This is also likely secondary to patient's acute blood loss anemia.  No laboratory evidence of acute liver failure. ?-Continue to monitor  ? ?#HTN ?Blood pressure elevated this AM to 671I systolic. His home medications of coreg and amlodipine were resumed this AM. He does not appear hypervolemic on exam.  ?-Continue to monitor ? ?#Poorly controlled type 2 diabetes ?Patient most recent A1c 11.9 (01/2021).  At home he takes glargine 22 units nightly and 11 units 3 times daily with meals.  Patient was started on reduced dose of his long-acting insulin given his n.p.o. status.  We will resume his full long-acting dose this p.m. as well as his home mealtime dose. ?CBG (last 3)  ?Recent Labs  ?  05/13/21 ?1347 05/13/21 ?1754 05/13/21 ?2139  ?GLUCAP 191* 180* 170*  ? ? ?H/o CHF, likely HFpEF ?Appears compensated on exam.  Most recent echo in 2015 showed normal EF with findings concerning G1DD. ?-CTM ? ?Prior to Admission Living Arrangement: Home with home health ?Anticipated Discharge Location: Home  ?Barriers to Discharge: States he requiring more assistance to help care for himself.  ?Dispo: Anticipated discharge in approximately 5 day(s).  ? ?[name] Brill, MD ?05/14/2021, 7:37 AM ?After 5pm on weekdays and 1pm on weekends: On Call pager [phone]  ?

## 2021-05-14 NOTE — Progress Notes (Signed)
Met with pt at bedside this am regarding out-pt HD needs at d/c. Introduced self and explained role. Pt prefers a clinic close to him home. Pt resides in [place] therefore will proceed with referral to [facility] admissions. Referral made this morning. Case discussed with nephrologist who feels pt is best suited for in-center HD. Inquired of pt how he will get to out-pt HD. Pt reports that pt's family will assist with transportation to/from HD appts. Will assist as needed.  ? ?Melven Sartorius ?Renal Navigator ?[phone] ?

## 2021-05-14 NOTE — Progress Notes (Signed)
Patient back in unit from HD ?

## 2021-05-14 NOTE — Progress Notes (Signed)
Pt. Off unit going to Hemodialysis ?

## 2021-05-14 NOTE — Progress Notes (Signed)
[facility] ?Progress Note ? ?Name: [name] ?MRN: [id number] ?DOB: 11-04-1960 ? ?Chief Complaint:  ?Flank pain ? ?Subjective:  ?He had 2.2 liters UOP over 5/11.  First HD on 5/10 with 0.5 kg UF after placement of tunn catheter with IR. Patient with severe stenosis of the proximal right main renal artery and moderate stenosis of the proximal left main renal artery. No significant interval change in size of left perinephric hematoma; also note moderate to severe disease of the common iliacs.    ? ?Review of systems:   ?Denies shortness of breath or chest pain ?No n/v ? ? ?Intake/Output Summary (Last 24 hours) at 05/14/2021 1303 ?Last data filed at 05/14/2021 0900 ?Gross per 24 hour  ?Intake 820 ml  ?Output 1500 ml  ?Net -680 ml  ? ? ?Vitals:  ?Vitals:  ? 05/14/21 0415 05/14/21 0417 05/14/21 0519 05/14/21 0908  ?BP: 134/71 134/69 133/68 126/71  ?Pulse: 76 76 77 75  ?Resp:  '18 18 17  '$ ?Temp:  98.2 ?F (36.8 ?C) 97.6 ?F (36.4 ?C) 97.6 ?F (36.4 ?C)  ?TempSrc:  Oral Oral Oral  ?SpO2:  100% 95% 98%  ?Weight:  81.7 kg    ?  ? ?Physical Exam:   ?General: adult male in stretcher in NAD  ?HEENT: NCAT ?Eyes: EOMI; sclera anicteric ?Neck: supple trachea midline  ?Heart: S1S2 no rub ?Lungs: clear and unlabored; normal work of breathing at rest; on room air ?Abdomen: soft/tender on palpation; somewhat distended; obese habitus ?Extremities: no edema left leg; right BKA with no residual limb edema appreciated ?Skin: no rash on extremities exposed ?Neuro: alert and oriented x 3 provides hx and follows commands  ?Psych normal mood and affect ?GU condom catheter in place  ? ?Medications reviewed    ? ?Labs:  ? ?  Latest Ref Rng & Units 05/14/2021  ?  8:39 AM 05/13/2021  ?  3:12 AM 05/12/2021  ?  6:07 PM  ?BMP  ?Glucose 70 - 99 mg/dL 218   250   269    ?BUN 8 - 23 mg/dL 39   69   71    ?Creatinine 0.61 - 1.24 mg/dL 4.69   7.43   7.71    ?Sodium 135 - 145 mmol/L 133   132   129    ?Potassium 3.5 - 5.1 mmol/L 3.5   5.0   5.0     ?Chloride 98 - 111 mmol/L 101   101   99    ?CO2 22 - 32 mmol/L '23   19   17    '$ ?Calcium 8.9 - 10.3 mg/dL 7.8   7.8   7.6    ? ?Renal biopsy results:  ?-Advanced nodular diabetic glomerulosclerosis, RPS class 4 & arterionephrosclerosis ?-18/24 glomeruli identified were globally sclerosed ?-Severe interstitial fibrosis (approx 80%) and tubular atrophy ?-[place] red stain negative for amyloid ?-IF negative ?-No evidence of immune complex deposition ? ? ?Assessment/Plan:  ? 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  CKD is 2/2 DM and HTN.  Now with Cr 8.  Patient with severe stenosis of the proximal right main renal artery and moderate stenosis of the proximal left main renal artery.  I do feel that he has progressed to end-stage renal disease and the need for dialysis.  Tunn catheter on 5/10 with first tx after that ?- Plan for next HD on 5/12 ?- Cardiology does not feel inpatient ischemic work-up is  needed - per cardiology as long as asymptomatic and Hb stable then he would be acceptable for an AVF as early as tomorrow (5/12) or Monday (5/15) ?- have called HD SW to start CLIP process to set up outpatient dialysis unit ?- getting vein mapping  ?- I have consulted VVS for expedited AVF placement (can be next week as an outpatient if needed) ? ?# Anemia Acute blood loss  ?- Retroperitoneal hematoma status post kidney biopsy. Iron is replete ?- s/p PRBC's ?- Started ESA - aranesp 40 mcg weekly on Thursdays ?  ?# Retroperitoneal hematoma ?- IR is consulted    ?  ?# Hyperkalemia  ?-temporized  ?- improved with HD ?  ?# Metabolic acidosis ?- 2/2 advanced CKD/ESRD ?- starting HD  ?  ?# HTN  ?- starting HD ?- controlled   ?  ?# Type 2 DM  ?- Uncontrolled  ?- per primary team  ?  ?# Elevated troponin ?- Per primary team  ?- Needs AVF eventually once medically cleared for same - per cardiology no further inpatient work-up indicated ?- I have asked cardiology to comment on when  he is acceptable for fistula placement  ?  ?# Metabolic bone disease ?- intact PTH is 190.  mild hyperphos - manage with HD for now  ?  ?Disposition - continue inpatient monitoring ? ?Claudia Desanctis, MD ?05/14/2021 1:37 PM ? ? ? ? ?

## 2021-05-14 NOTE — Progress Notes (Signed)
[facility] Note ? ?Progress Note ? ?Patient Name: [name] ?Date of Encounter: 05/14/2021 ? ?Primary Cardiologist: New to [facility] ? ?Subjective  ? ?Reviewed echo findings with patient.  ?Patient denies chest pain, chest pressure, SOB, DOE, or orthopnea.  He is hungry and is disappointed that he needs HD. ? ?Inpatient Medications  ?  ?Scheduled Meds: ? sodium chloride   Intravenous Once  ? amLODipine  10 mg Oral Daily  ? carvedilol  12.5 mg Oral BID WC  ? Chlorhexidine Gluconate Cloth  6 each Topical Q0600  ? darbepoetin (ARANESP) injection - NON-DIALYSIS  40 mcg Subcutaneous Q Wed-1800  ? DULoxetine  60 mg Oral Daily  ? fluticasone  1 spray Each Nare Daily  ? insulin aspart  0-6 Units Subcutaneous TID WC  ? insulin glargine-yfgn  20 Units Subcutaneous QHS  ? pantoprazole  40 mg Oral Daily  ? rosuvastatin  10 mg Oral Daily  ? ?Continuous Infusions: ? sodium chloride    ? sodium chloride    ? ?PRN Meds: ?sodium chloride, sodium chloride, acetaminophen **OR** acetaminophen, alteplase, heparin, lidocaine (PF), lidocaine-prilocaine, pentafluoroprop-tetrafluoroeth  ? ?Vital Signs  ?  ?Vitals:  ? 05/14/21 0400 05/14/21 0415 05/14/21 0417 05/14/21 0519  ?BP: (!) 142/72 134/71 134/69 133/68  ?Pulse: 76 76 76 77  ?Resp:   18 18  ?Temp:   98.2 ?F (36.8 ?C) 97.6 ?F (36.4 ?C)  ?TempSrc:   Oral Oral  ?SpO2:   100% 95%  ?Weight:   81.7 kg   ? ? ?Intake/Output Summary (Last 24 hours) at 05/14/2021 0823 ?Last data filed at 05/14/2021 0500 ?Gross per 24 hour  ?Intake 580 ml  ?Output 2700 ml  ?Net -2120 ml  ? ?Filed Weights  ? 05/13/21 2251 05/14/21 0210 05/14/21 0417  ?Weight: 81.5 kg 82.2 kg 81.7 kg  ? ? ?Telemetry  ?  ?SR rare PVCs; episode labeled VF is artifact - Personally Reviewed ? ? Physical Exam  ? ?Gen: no distress ?Neck: No JVD ?Ears: Bilateral Pilar Plate Sign ?Cardiac: No Rubs or Gallops, No Murmur, RRR ?Respiratory: Clear to auscultation bilaterally, normal effort, normal  respiratory rate ?GI: Soft,  nontender, non-distended on my exam ?MS: No edema ?Integument: Skin feels war, R BKA without tenderness or erythema, Left TMA is c/di ?Neuro:  At time of evaluation, alert and oriented to person/place/time/situation  ? ? ?Labs  ?  ?Chemistry ?Recent Labs  ?Lab 05/12/21 ?1124 05/12/21 ?1302 05/12/21 ?1303 05/12/21 ?1807 05/13/21 ?3244  ?NA 127*  --   --  129* 132*  ?K 5.7*  --   --  5.0 5.0  ?CL 95*  --   --  99 101  ?CO2 16*  --   --  17* 19*  ?GLUCOSE 313*  --  303* 269* 250*  ?BUN 70*  --   --  71* 69*  ?CREATININE 8.05*  --   --  7.71* 7.43*  ?CALCIUM 7.8*  --   --  7.6* 7.8*  ?PROT  --  7.3  --   --  6.7  ?ALBUMIN  --  3.0*  --   --  2.8*  ?AST  --  107*  --   --  100*  ?ALT  --  109*  --   --  119*  ?ALKPHOS  --  98  --   --  116  ?BILITOT  --  1.0  --   --  1.7*  ?GFRNONAA 7*  --   --  7* 8*  ?  ANIONGAP 16*  --   --  13 12  ?  ? ?Hematology ?Recent Labs  ?Lab 05/12/21 ?1124 05/12/21 ?2010 05/12/21 ?2259 05/13/21 ?7371 05/13/21 ?0844  ?WBC 13.4*  --   --  11.2* 11.0*  ?RBC 2.32*  --   --  3.04* 3.10*  ?HGB 6.9*   < > 8.7* 8.7* 9.0*  ?HCT 20.9*   < > 27.1* 26.3* 26.9*  ?MCV 90.1  --   --  86.5 86.8  ?MCH 29.7  --   --  28.6 29.0  ?MCHC 33.0  --   --  33.1 33.5  ?RDW 13.1  --   --  15.4 15.5  ?PLT 257  --   --  240 248  ? < > = values in this interval not displayed.  ? ? ?Cardiac EnzymesNo results for input(s): TROPONINI in the last 168 hours. No results for input(s): TROPIPOC in the last 168 hours.  ? ?BNPNo results for input(s): BNP, PROBNP in the last 168 hours.  ? ?DDimer No results for input(s): DDIMER in the last 168 hours.  ? ?Radiology  ?  ?CT ANGIO ABDOMEN W &/OR WO CONTRAST ? ?Result Date: 05/13/2021 ?CLINICAL DATA:  Renal hemorrhage post biopsy EXAM: CT ANGIOGRAPHY ABDOMEN TECHNIQUE: Multidetector CT imaging of the abdomen was performed using the standard protocol during bolus administration of intravenous contrast. Multiplanar reconstructed images and MIPs were obtained and reviewed to evaluate the  vascular anatomy. RADIATION DOSE REDUCTION: This exam was performed according to the departmental dose-optimization program which includes automated exposure control, adjustment of the mA and/or kV according to patient size and/or use of iterative reconstruction technique. CONTRAST:  57m OMNIPAQUE IOHEXOL 350 MG/ML SOLN COMPARISON:  Renal stone protocol CT 05/12/2021 CT abdomen 05/08/2021 FINDINGS: VASCULAR Aorta: Atherosclerotic calcified and noncalcified plaque seen throughout the abdominal aorta without aneurysmal dilatation. Celiac: No significant stenosis. SMA: No significant stenosis. 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 Moderate stenosis of the left main renal artery. IMA: Moderate stenosis at the origin, otherwise patent. Inflow: Severe stenosis present at the origin of the right common iliac artery due to calcified plaque. Moderate stenosis at the origin of the left common iliac artery. Veins: No obvious venous abnormality within the limitations of this arterial phase study. Review of the MIP images confirms the above findings. NON-VASCULAR Lower chest: Trace bilateral pleural effusions, minimally increased since examination from 05/08/2021. Mild bibasilar atelectasis. Hepatobiliary: Diffuse hepatic steatosis. Nonspecific heterogeneous enhancement of the patent parenchyma. Gallbladder is contracted. No bile duct dilatation. Pancreas: Unremarkable. No pancreatic ductal dilatation or surrounding inflammatory changes. Spleen: Normal in size without focal abnormality. Adrenals/Urinary Tract: Adrenal glands are normal. Left subcapsular and perinephric hematoma is minimally increased in thickness compared to prior CT from 05/12/2021 now measuring up to 1.8 cm in thickness compared to 1.4 cm on prior exam. Left perinephric hematoma located adjacent to the lower pole is not significantly changed in size measuring approximately 12.4 x 7.0 x 10.0 cm on the current examination. There is no  active extravasation. Small rounded 9 mm hypodense structure seen in the anterior left lower pole. It is too small to fully characterize. Right kidney is normal in appearance. Stomach/Bowel: No dilated loops of bowel within the visualized abdomen. Lymphatic: No enlarged mesenteric or retroperitoneal lymph nodes. Other: No hernia. Musculoskeletal: No acute or significant osseous findings. IMPRESSION: VASCULAR 1. No active extravasation from the left kidney. 2. 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 Moderate stenosis of the  proximal left main renal artery. 3. Severe stenosis at the origin of the right common iliac artery. Moderate stenosis of the origin of the left common iliac artery. NON-VASCULAR 1. Mild interval increase in thickness left subcapsular hematoma measuring 1.8 cm compared to 1.4 cm on prior examination from 05/12/2021. 2. No significant interval change in size left perinephric hematoma which now measures 12.4 x 7.0 x 10.0 cm. 3. Diffuse hepatic steatosis. Nonspecific heterogeneous enhancement of the hepatic parenchyma. Electronically Signed   By: Miachel Roux M.D.   On: 05/13/2021 09:13  ? ?IR Fluoro Guide CV Line Right ? ?Result Date: 05/13/2021 ?CLINICAL DATA:  End-stage renal disease and need for tunneled hemodialysis catheter. EXAM: TUNNELED CENTRAL VENOUS HEMODIALYSIS CATHETER PLACEMENT WITH ULTRASOUND AND FLUOROSCOPIC GUIDANCE ANESTHESIA/SEDATION: Moderate (conscious) sedation was employed during this procedure. A total of Versed 0.5 mg and Fentanyl 25 mcg was administered intravenously by radiology nursing. Moderate Sedation Time: 17 minutes. The patient's level of consciousness and vital signs were monitored continuously by radiology nursing throughout the procedure under my direct supervision. MEDICATIONS: 2 g IV Ancef. FLUOROSCOPY: 12 seconds.  1.0 mGy PROCEDURE: The procedure, risks, benefits, and alternatives were explained to the patient. Questions regarding the procedure  were encouraged and answered. The patient understands and consents to the procedure. A timeout was performed prior to initiating the procedure. The right neck and chest were prepped with chlorhexidine in a ste

## 2021-05-14 NOTE — Progress Notes (Signed)
? ?Patient Status: [facility] - In-pt ? ?Assessment and Plan: ?Renal hematoma s/p biopsy 05/08/21 ?Patient with admission after renal biopsy performed 05/08/21.  ?IR was consulted for possible embolization given progression of hematoma by non-contrast CT with associated drop in HgB to 6.5. ?In the absence of a contrast-enhanced CT which would show active extravasation an embolization was not performed and patient was managed conservatively with blood products and renal protection.  ?He is s/p HD catheter placement in IR 05/13/21.  ? ?His SCr did improve significantly overnight and he was able to undergo CT Abd Pel w/contrast which did not show active extravasation and was stable compared to prior scan.  ? ?Discussed with Dr. Pascal Lux.  No plans to proceed with embolization at this time as patient appears stable without imaging evidence of ongoing bleeding.  ? ?PA to bedside to discuss with patient who states his back pain has improved since admission.  He states he "does not know" if he has started dialysis and is generally disgruntled about having to currently maintain a renal diet. All concerns brought to RN on patient behalf.  ? ?IR remains available.  ? ?______________________________________________________________________ ? ? ?History of Present Illness: ?[name] is a 61 y.o. male with history of DM, PVD, PAD, HTN, HLD, CHF, stage 4 CKD. Found to have proteinuria and worsening Creatinine. He underwent renal biopsy 02/07/28 which was complicated by a small hematoma by CT scan during recovery.  He was sent home with pain management, however subsequently experienced worsening of his back pain with dizziness and presented to the ED where he was found to have an enlarged subcapsular and perinephric hematoma.  IR was consulted for possible embolization. ? ?Allergies and medications reviewed.  ? ?Review of Systems: A 12 point ROS discussed and pertinent positives are indicated in the HPI above.  All other systems are  negative. ? ?Review of Systems  ?Constitutional:  Negative for fatigue and fever.  ?Respiratory:  Negative for cough and shortness of breath.   ?Gastrointestinal:  Negative for abdominal pain and nausea.  ?Musculoskeletal:  Positive for back pain.  ?Neurological:  Positive for dizziness.  ?Psychiatric/Behavioral:  Negative for behavioral problems and confusion.   ? ?Vital Signs: ?BP 126/71 (BP Location: Right Arm)   Pulse 75   Temp 97.6 ?F (36.4 ?C) (Oral)   Resp 17   Wt 180 lb 1.9 oz (81.7 kg)   SpO2 98%   BMI 25.12 kg/m?  ? ?Physical Exam ?Vitals and nursing note reviewed.  ?Constitutional:   ?   General: He is not in acute distress. ?   Appearance: Normal appearance.  ?HENT:  ?   Mouth/Throat:  ?   Mouth: Mucous membranes are moist.  ?   Pharynx: Oropharynx is clear.  ?Cardiovascular:  ?   Rate and Rhythm: Normal rate.  ?Pulmonary:  ?   Effort: Pulmonary effort is normal. No respiratory distress.  ?Skin: ?   General: Skin is warm and dry.  ?Neurological:  ?   General: No focal deficit present.  ?   Mental Status: He is alert and oriented to person, place, and time.  ? ? ? ?Imaging reviewed.  ? ?Labs: ? ?COAGS: ?Recent Labs  ?  05/08/21 ?0762 05/12/21 ?1302 05/13/21 ?0312  ?INR 1.1 1.3* 1.2  ?APTT  --  40*  --   ? ? ?BMP: ?Recent Labs  ?  05/12/21 ?1124 05/12/21 ?1303 05/12/21 ?1807 05/13/21 ?0312 05/14/21 ?0839  ?NA 127*  --  129* 132* 133*  ?K  5.7*  --  5.0 5.0 3.5  ?CL 95*  --  99 101 101  ?CO2 16*  --  17* 19* 23  ?GLUCOSE 313* 303* 269* 250* 218*  ?BUN 70*  --  71* 69* 39*  ?CALCIUM 7.8*  --  7.6* 7.8* 7.8*  ?CREATININE 8.05*  --  7.71* 7.43* 4.69*  ?GFRNONAA 7*  --  7* 8* 13*  ? ? ? ? ? ?Electronically Signed: ?Docia Barrier, PA ?05/14/2021, 4:49 PM ? ? ?I spent a total of 15 minutes in face to face in clinical consultation, greater than 50% of which was counseling/coordinating care for retroperitoneal hematoma. ? ? ?

## 2021-05-14 NOTE — Progress Notes (Addendum)
? ?Subjective:  ?Patient was seen at bedside this afternoon.  He states that he feels better compared to yesterday.  His abdominal area "feels funny," but he does not endorse any pain.  Asking for more food and water. No other complaints or concerns. ? ?Objective: ? ?Vital signs in last 24 hours: ?Vitals:  ? 05/14/21 0415 05/14/21 0417 05/14/21 0519 05/14/21 0908  ?BP: 134/71 134/69 133/68 126/71  ?Pulse: 76 76 77 75  ?Resp:  '18 18 17  '$ ?Temp:  98.2 ?F (36.8 ?C) 97.6 ?F (36.4 ?C) 97.6 ?F (36.4 ?C)  ?TempSrc:  Oral Oral Oral  ?SpO2:  100% 95% 98%  ?Weight:  81.7 kg    ? ? ?Constitutional: Chronically ill appearing and in no distress.  ?HENT:  ?Head: Normocephalic and atraumatic.  ?Eyes: EOM are normal.  ?Neck: Normal range of motion.  ?Cardiovascular: Normal rate, regular rhythm, intact distal pulses. No gallop and no friction rub.  ?No murmur heard. No lower extremity edema  ?Pulmonary: Non labored breathing on room air, no wheezing or rales  ?Abdominal: Soft. Normal bowel sounds. Non distended and non tender ?Musculoskeletal: Normal range of motion. S/p R BKA, s/p left transmetatarsal amputation  ?   General: No tenderness or edema.  ?Neurological: Alert and oriented to person, place, and time. Non focal  ?Skin: Skin is warm and dry. There are small pressure injuries on the lateral aspect of the LLE and at most distal aspect of his RLE. ? ? ?Assessment/Plan: ? ?Principal Problem: ?  Acute blood loss anemia ? ?#Large retroperitoneal hematoma  ?#Symptomatic anemia, improving ?Patient developed left lower abdominal pain, flank pain, hematuria, and dizziness after a renal biopsy 5/5.  Imaging here at the hospital showed large retroperitoneal hematoma.  He underwent a CT a of the abdomen and pelvis which showed no evidence of a potential intervenable target for embolization.  His hemoglobin stabilized after 2 units of packed red blood cells.  ?-Trend CBC ?-Transfuse for Hgb <7 ?-PT recommending home health, OT  recommending SNF ? 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  It appears his CKD is secondary to his poorly controlled diabetes and hypertension.  Tunneled cath was placed by IR on 5/10 and received first treatment after that.  CLIP process is underway. ?-IR on board, appreciate their recs ?-Nephrology is on board, appreciate their recommendations ?-Vascular surgery is on board, appreciate their recommendations ?-Next HD treatment is scheduled for 5/12 ?-Avoid nephrotoxic medications ?-Trend BMP ? ?#Type 2 NSTEMI ?#New ischemic cardiomyopathy ?Patient had elevated troponins. ?Component Ref Range & Units 08:44 ?(05/13/21) 03:12 ?(05/13/21) 1 d ago ?(05/12/21) 1 d ago ?(05/12/21) 1 d ago ?(05/12/21) 1 d ago ?(05/12/21) 2 yr ago ?(03/30/19)  ?Troponin I (High Sensitivity) <18 ng/L 1,330 High Panic   1,743 High Panic  CM  1,540 High Panic  CM  1,529 High Panic  CM  1,452 High Panic  CM  1,425 High Panic  CM  16 CM  ? ?His EKG did show ST depression and T wave inversions and lateral leads as well as inferior leads.  This appears to be chronic however.  Given his acute blood loss anemia he was thought to have a type II NSTEMI.  Cardiology was consulted overnight and agree with this.  ?-Rosuvastatin 10 mg p.o. daily ?-Zetia 10 mg p.o. daily ?-Low-dose Imdur ?-Avoiding MRA/SGLT2/ARNI in the setting of ESRD ? ?#Ischemic Hepatitis, improving ?Patient also noted to have elevated transaminases on admission.  This is also likely secondary to patient's acute blood loss anemia.  No laboratory evidence of acute liver failure. ?-Continue to monitor  ? ?#HTN ?Normotensive on home Coreg and amlodipine.  Does not appear hypervolemic on exam. ?-Continue to monitor ? ?#Poorly controlled type 2 diabetes ?Patient most recent A1c 11.9 (01/2021).  At home he takes glargine 22 units nightly and 11 units 3 times daily with meals.   ?-Semglee 20 units daily at bedtime ?-CBG monitoring ?-SSI ? ?H/o CHF,  likely HFpEF ?Appears compensated on exam.  Most recent echo was performed on 5/10, showing EF 30-35% with moderately decreased function of left ventricle and G2DD. ?-CTM ? ?Prior to Admission Living Arrangement: Home with home health ?Anticipated Discharge Location: Home vs SNF ?Barriers to Discharge: CLIP process, placement ?Dispo: Anticipated discharge pending CLIP process, placement ? ?Orvis Brill, MD ?05/14/2021, 4:05 PM ?After 5pm on weekdays and 1pm on weekends: On Call pager [phone]  ?

## 2021-05-15 ENCOUNTER — Inpatient Hospital Stay (HOSPITAL_COMMUNITY): Payer: Medicare Other

## 2021-05-15 DIAGNOSIS — D62 Acute posthemorrhagic anemia: Secondary | ICD-10-CM | POA: Diagnosis not present

## 2021-05-15 DIAGNOSIS — N186 End stage renal disease: Secondary | ICD-10-CM | POA: Diagnosis not present

## 2021-05-15 DIAGNOSIS — K661 Hemoperitoneum: Secondary | ICD-10-CM | POA: Diagnosis not present

## 2021-05-15 DIAGNOSIS — Z992 Dependence on renal dialysis: Secondary | ICD-10-CM | POA: Diagnosis not present

## 2021-05-15 LAB — BASIC METABOLIC PANEL
Anion gap: 10 (ref 5–15)
BUN: 47 mg/dL — ABNORMAL HIGH (ref 8–23)
CO2: 21 mmol/L — ABNORMAL LOW (ref 22–32)
Calcium: 7.7 mg/dL — ABNORMAL LOW (ref 8.9–10.3)
Chloride: 98 mmol/L (ref 98–111)
Creatinine, Ser: 5.86 mg/dL — ABNORMAL HIGH (ref 0.61–1.24)
GFR, Estimated: 10 mL/min — ABNORMAL LOW (ref >60)
Glucose, Bld: 160 mg/dL — ABNORMAL HIGH (ref 70–99)
Potassium: 4 mmol/L (ref 3.5–5.1)
Sodium: 129 mmol/L — ABNORMAL LOW (ref 135–145)

## 2021-05-15 LAB — CBC
HCT: 25.4 % — ABNORMAL LOW (ref 39.0–52.0)
HCT: 26.5 % — ABNORMAL LOW (ref 39.0–52.0)
Hemoglobin: 8.1 g/dL — ABNORMAL LOW (ref 13.0–17.0)
Hemoglobin: 8.7 g/dL — ABNORMAL LOW (ref 13.0–17.0)
MCH: 28 pg (ref 26.0–34.0)
MCH: 28.6 pg (ref 26.0–34.0)
MCHC: 31.9 g/dL (ref 30.0–36.0)
MCHC: 32.8 g/dL (ref 30.0–36.0)
MCV: 87.9 fL (ref 80.0–100.0)
MCV: 87.2 fL (ref 80.0–100.0)
Platelets: 222 10*3/uL (ref 150–400)
Platelets: 220 10*3/uL (ref 150–400)
RBC: 2.89 MIL/uL — ABNORMAL LOW (ref 4.22–5.81)
RBC: 3.04 MIL/uL — ABNORMAL LOW (ref 4.22–5.81)
RDW: 14.9 % (ref 11.5–15.5)
RDW: 15.2 % (ref 11.5–15.5)
WBC: 9.8 10*3/uL (ref 4.0–10.5)
WBC: 8.6 10*3/uL (ref 4.0–10.5)
nRBC: 0.3 % — ABNORMAL HIGH (ref 0.0–0.2)
nRBC: 0 % (ref 0.0–0.2)

## 2021-05-15 LAB — GLUCOSE, CAPILLARY
Glucose-Capillary: 151 mg/dL — ABNORMAL HIGH (ref 70–99)
Glucose-Capillary: 105 mg/dL — ABNORMAL HIGH (ref 70–99)
Glucose-Capillary: 223 mg/dL — ABNORMAL HIGH (ref 70–99)
Glucose-Capillary: 207 mg/dL — ABNORMAL HIGH (ref 70–99)

## 2021-05-15 MED ORDER — HYDROMORPHONE HCL 2 MG PO TABS
1.0000 mg | ORAL_TABLET | Freq: Once | ORAL | Status: AC
  Administered 2021-05-15: 1 mg via ORAL
  Filled 2021-05-15: qty 1

## 2021-05-15 MED ORDER — SENNOSIDES-DOCUSATE SODIUM 8.6-50 MG PO TABS
1.0000 | ORAL_TABLET | Freq: Every day | ORAL | Status: DC
  Administered 2021-05-15 – 2021-05-20 (×6): 1 via ORAL
  Filled 2021-05-15 (×6): qty 1

## 2021-05-15 MED ORDER — CARVEDILOL 12.5 MG PO TABS
12.5000 mg | ORAL_TABLET | Freq: Two times a day (BID) | ORAL | Status: DC
  Administered 2021-05-15 – 2021-05-19 (×10): 12.5 mg via ORAL
  Filled 2021-05-15 (×10): qty 1

## 2021-05-15 NOTE — Progress Notes (Signed)
Attempted upper extremity vein mapping x2; patient in HD, patient in chair eating. Will attempt again as schedule permits. ? ?05/15/2021 3:17 PM ?Kelby Aline., MHA, RVT, RDCS, RDMS   ?

## 2021-05-15 NOTE — TOC Initial Note (Signed)
Transition of Care [facility]) - Initial/Assessment Note    Patient Details  Name: [name] MRN: [id number] Date of Birth: 28-Jun-1960  Transition of Care [facility]) CM/SW Contact:    Ralene Bathe, LCSWA Phone Number: 05/15/2021, 3:37 PM  Clinical Narrative:                 CSW received consult for possible SNF placement at time of discharge. CSW spoke with patient. Patient reported that he is not moving around like he did previously and expressed understanding of PT recommendation and is agreeable to SNF placement at time of discharge. Patient reports preference for a facility in [place]. CSW discussed insurance authorization process and will provide Medicare SNF ratings list.  CSW will send out referrals for review.  No further questions reported at this time.   Skilled Nursing Rehab Facilities-   [email or web address]   Ratings out of 5 possible   Name Address  Phone # Quality Care Staffing Health Inspection Overall  [facility] [address], [place] [phone] 4 5 2 3   [facility]  [address], [place] [phone] 3 2 5 5   [facility] [address] [place], [place] [phone] 3 1 1 1   [facility] [address] [phone] 3 2 4 4   [facility] [address] [place] [phone] 1 1 2 1   [facility] [phone] [address] [place] [phone] [facility] [address], [place] [phone] 5 2 3 4   [facility] [address], [place] [phone] 5 2 2 3   [address] ([place]) [address] [place] [phone] 5 1 2 2   [facility] [address], [place] [phone] 4 1 2 1   [facility] [address], [facility] [phone] 4 1 2 1   [facility] ([place]) [address], [place] [phone] 4 1 1 1   [facility]igh[facility] [address] [place] [phone] 3 2 4 4           [facility] [address] [place] [phone]      [facility] [address], [place] [phone] 4 2 3 3   [facility] [place] [address] [place] [phone] 4 1 5 4   [address], [place] [address] [place] [address], [place] [phone] 2 1 1 1   [facility] [address], [employer] [phone] 2 1 3 2           [address] (no [facility]) [address], [place] [phone] 4 5 5 5   [facility] (No Humana) [address], [place] [phone] 3 1 4 3   [facility] (No [facility]) [address], [place] [phone] 5 5 5 5   [facility] [address], [employer] [phone] 3 2 4 4   [facility] [address] [place] [phone] 1 1 2 1   [facility] [address] [place] [phone] 2 1 1 1   [facility] [address] [place] [phone] 5 2 4 5   [facility] [address], [place] [phone] 3 1 1 1   [facility] [address] [place], [place] [phone] 2 1 2 1           [facility] [address], [place] [phone] 1 1 1 1   [facility] [address] [place]  [phone] 2 4 2 2   [facility] [address], [place] [phone] 5 2 3 4   [facility] [place] [address] [place] [phone]  2 1 1 1   [facility] (No Humana) [address], [place] [phone] 2 1 3 2   [facility] [address] [place] [phone] 3 1 1 1           [facility] [address] [place], [place] [phone] 5 4 5 5   [facility])  [address], [place] [phone] 2 2 3 3   [facility] [address], [place] [phone] 3 2 4 4   [facility] [address] [place] [address], [place] [phone] 4 3 4 4   [address] [place], [place] [phone] 3 3 1 1   [facility]) [address] [place] [phone] 2 2 4 4      Expected Discharge Plan: Skilled Nursing  Facility Barriers to Discharge: Insurance Authorization, SNF Pending bed offer, Continued Medical Work up   Patient Goals and CMS Choice Patient states their goals for this hospitalization and ongoing recovery are:: To get around (mobilize) like he "used to". CMS Medicare.gov Compare Post Acute Care list provided to:: Patient Choice offered to / list presented to : Patient  Expected Discharge Plan and Services Expected Discharge Plan: Skilled Nursing Facility       Living arrangements for the past 2 months: Apartment                                      Prior Living Arrangements/Services Living arrangements for the past 2 months: Apartment Lives with:: Self Patient language and need for interpreter reviewed:: Yes Do you feel safe going back to the place where you live?: Yes      Need for Family Participation in Patient Care: Yes (Comment) Care giver support system in place?: Yes (comment)   Criminal Activity/Legal Involvement Pertinent to Current Situation/Hospitalization: No - Comment as needed  Activities of Daily Living Home Assistive Devices/Equipment: None, Prosthesis ADL Screening (condition at time of admission) Patient's cognitive ability adequate to safely complete daily activities?: Yes Is the patient deaf or have difficulty hearing?: No Does the patient have difficulty seeing, even when wearing glasses/contacts?: No Does the patient have difficulty concentrating, remembering, or making decisions?: No Patient able to express need for assistance with ADLs?: Yes Does the patient have difficulty dressing or bathing?: No Independently performs ADLs?: Yes (appropriate for developmental age) Does the patient have difficulty walking or climbing stairs?: No Weakness of Legs: None Weakness of Arms/Hands: None  Permission Sought/Granted Permission sought to share information with : Other (comment) (CSW) Permission granted to share information with : Yes, Verbal  Permission Granted  Share Information with NAME: Delores  Permission granted to share info w AGENCY: SNF        Emotional Assessment Appearance:: Appears stated age Attitude/Demeanor/Rapport: Gracious, Engaged Affect (typically observed): Accepting, Hopeful, Pleasant Orientation: : Oriented to Self, Oriented to Place, Oriented to Situation Alcohol / Substance Use: Not Applicable Psych Involvement: No (comment)  Admission diagnosis:  Acute blood loss anemia [D62] Retroperitoneal hematoma [K66.1] Acute cystitis with hematuria [N30.01] Acute renal failure superimposed on stage 4 chronic kidney disease, unspecified acute renal failure type (HCC) [N17.9, N18.4] Patient Active Problem List   Diagnosis Date Noted   Retroperitoneal hematoma    ESRD (end stage renal disease) (HCC)    Acute blood loss anemia 05/12/2021   Colon polyps 03/03/2021   Mild cognitive impairment with memory loss 02/03/2021   Difficulty urinating 02/03/2021  Hx of medication noncompliance 09/30/2020   Rash 09/22/2020   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   Wound of right leg 11/09/2019   CKD stage 4 due to type 2 diabetes mellitus (HCC) 10/27/2018   S/P BKA (below knee amputation) unilateral, right (HCC) 08/17/2018   Pulmonary nodules/lesions, multiple 02/16/2018   Diabetic peripheral neuropathy (HCC) 05/30/2014   GERD (gastroesophageal reflux disease) 05/25/2013   Diastolic dysfunction 03/27/2013   Healthcare maintenance 02/15/2013   Chronic cough 06/01/2012   PVD (peripheral vascular disease) (HCC) 12/31/2011   Type 2 diabetes mellitus (HCC) 03/06/2008   HLD (hyperlipidemia) 03/06/2008   Essential hypertension 03/06/2008   PCP:  Belva Agee, MD Pharmacy:   [facility] [address] [place] [address] Phone: [phone] Fax: [phone]  [facility] [address] [place]  [address] Phone: [phone] Fax: [phone]  [facility] [address] [place] [address] Phone: [phone] Fax: [phone]  [id number] [place], [place] - [address] AT [facility] & [address] [place] [address] Phone: [phone] Fax: [phone]     Social Determinants of Health (SDOH) Interventions    Readmission Risk Interventions     View : No data to display.

## 2021-05-15 NOTE — Progress Notes (Addendum)
? ?Progress Note ? ?Patient Name: [name] ?Date of Encounter: 05/15/2021 ? ?[facility] Cardiologist: Werner Lean, MD  ? ?Subjective  ? ?Seen during dialysis. Very lethargic. No chest pain or dyspnea.  ? ?Inpatient Medications  ?  ?Scheduled Meds: ? sodium chloride   Intravenous Once  ? carvedilol  12.5 mg Oral BID WC  ? Chlorhexidine Gluconate Cloth  6 each Topical Q0600  ? [START ON 05/21/2021] darbepoetin (ARANESP) injection - NON-DIALYSIS  40 mcg Subcutaneous Q Thu-1800  ? DULoxetine  60 mg Oral Daily  ? ezetimibe  10 mg Oral Daily  ? fluticasone  1 spray Each Nare Daily  ? insulin aspart  0-6 Units Subcutaneous TID WC  ? insulin glargine-yfgn  20 Units Subcutaneous QHS  ? isosorbide mononitrate  15 mg Oral Daily  ? nicotine  14 mg Transdermal Daily  ? pantoprazole  40 mg Oral Daily  ? rosuvastatin  10 mg Oral Daily  ? senna-docusate  1 tablet Oral QHS  ? ?Continuous Infusions: ? ? ?PRN Meds: ?acetaminophen **OR** acetaminophen  ? ?Vital Signs  ?  ?Vitals:  ? 05/15/21 1000 05/15/21 1030 05/15/21 1100 05/15/21 1130  ?BP: 135/72 140/73 132/66 134/79  ?Pulse: 67 70 69 72  ?Resp:   16   ?Temp:    98.2 ?F (36.8 ?C)  ?TempSrc:    Temporal  ?SpO2:    98%  ?Weight:    73.5 kg  ? ? ?Intake/Output Summary (Last 24 hours) at 05/15/2021 1313 ?Last data filed at 05/15/2021 1130 ?Gross per 24 hour  ?Intake 750 ml  ?Output 500 ml  ?Net 250 ml  ? ? ?  05/15/2021  ? 11:30 AM 05/15/2021  ?  8:56 AM 05/14/2021  ?  4:17 AM  ?Last 3 Weights  ?Weight (lbs) 162 lb 0.6 oz 162 lb 14.7 oz 180 lb 1.9 oz  ?Weight (kg) 73.5 kg 73.9 kg 81.7 kg  ?   ? ?Telemetry  ?  ?NSR - Personally Reviewed ? ?ECG  ?  ?N/A ? ?Physical Exam  ? ?GEN: No acute distress.   ?Neck: No JVD ?Cardiac: RRR, no murmurs, rubs, or gallops.  ?Respiratory: Clear to auscultation bilaterally. ?GI: Soft, nontender, non-distended  ?MS: S/p R BKA ?Neuro:  Nonfocal  ?Psych: Lethargic  ? ?Labs  ?  ?High Sensitivity Troponin:   ?Recent Labs  ?Lab 05/12/21 ?1807  05/12/21 ?2010 05/12/21 ?2259 05/13/21 ?2426 05/13/21 ?0844  ?TROPONINIHS 1,452* 1,529* 1,540* 1,743* 1,330*  ?   ?Chemistry ?Recent Labs  ?Lab 05/12/21 ?1302 05/12/21 ?1303 05/12/21 ?1807 05/13/21 ?8341 05/14/21 ?9622 05/15/21 ?0225  ?NA  --   --    < > 132* 133* 129*  ?K  --   --    < > 5.0 3.5 4.0  ?CL  --   --    < > 101 101 98  ?CO2  --   --    < > 19* 23 21*  ?GLUCOSE  --  303*   < > 250* 218* 160*  ?BUN  --   --    < > 69* 39* 47*  ?CREATININE  --   --    < > 7.43* 4.69* 5.86*  ?CALCIUM  --   --    < > 7.8* 7.8* 7.7*  ?MG  --  2.2  --   --   --   --   ?PROT 7.3  --   --  6.7 6.4*  --   ?ALBUMIN 3.0*  --   --  2.8* 2.5*  --   ?AST 107*  --   --  100* 39  --   ?ALT 109*  --   --  119* 71*  --   ?ALKPHOS 98  --   --  116 94  --   ?BILITOT 1.0  --   --  1.7* 1.3*  --   ?GFRNONAA  --   --    < > 8* 13* 10*  ?ANIONGAP  --   --    < > '12 9 10  '$ ? < > = values in this interval not displayed.  ?  ? ?Hematology ?Recent Labs  ?Lab 05/13/21 ?1093 05/14/21 ?2355 05/15/21 ?0225  ?WBC 11.0* 10.9* 9.8  ?RBC 3.10* 3.08* 2.89*  ?HGB 9.0* 8.8* 8.1*  ?HCT 26.9* 26.6* 25.4*  ?MCV 86.8 86.4 87.9  ?MCH 29.0 28.6 28.0  ?MCHC 33.5 33.1 31.9  ?RDW 15.5 15.1 14.9  ?PLT 248 233 222  ? ? ? ?Radiology  ?  ?CT ABDOMEN PELVIS WO CONTRAST ? ?Result Date: 05/15/2021 ?CLINICAL DATA:  Abdominal pain and back pain. EXAM: CT ABDOMEN AND PELVIS WITHOUT CONTRAST TECHNIQUE: Multidetector CT imaging of the abdomen and pelvis was performed following the standard protocol without IV contrast. RADIATION DOSE REDUCTION: This exam was performed according to the departmental dose-optimization program which includes automated exposure control, adjustment of the mA and/or kV according to patient size and/or use of iterative reconstruction technique. COMPARISON:  May 13, 2021 FINDINGS: Lower chest: Mild-to-moderate severity atelectatic changes are seen within the bilateral lung bases. There are small bilateral pleural effusions with a partially loculated  component seen on the right. Hepatobiliary: No focal liver abnormality is seen. The gallbladder is contracted and subsequently limited in evaluation. Pancreas: Unremarkable. No pancreatic ductal dilatation or surrounding inflammatory changes. Spleen: Normal in size without focal abnormality. Adrenals/Urinary Tract: Adrenal glands are unremarkable. A left-sided subcapsular perirenal hematoma is again seen. This measures approximately 2.0 cm in thickness and is very mildly increased in size when compared to the prior study (measured 1.8 cm in thickness on the prior study). A large, perinephric hematoma is also seen adjacent to the lower pole of the left kidney. This measures approximately 9.0 cm x 4.9 cm by 14.6 cm on the current exam (measured 12.4 cm x 7.0 cm x 10.0 cm on the prior study). The right kidney is unremarkable. Contrast is seen throughout the lumen of an otherwise normal appearing urinary bladder. Stomach/Bowel: Stomach is within normal limits. Appendix appears normal. No evidence of bowel wall thickening, distention, or inflammatory changes. Vascular/Lymphatic: Aortic atherosclerosis. No enlarged abdominal or pelvic lymph nodes. Reproductive: Prostate gland is mildly enlarged. Other: No abdominal wall hernia or abnormality. No abdominopelvic ascites. Musculoskeletal: No acute or significant osseous findings. IMPRESSION: 1. Large left-sided subcapsular and perinephric hematomas which are mildly increased in size when compared to the prior study. 2. Small bilateral pleural effusions with a partially loculated component seen on the right. 3. Cholelithiasis. 4. Prostatomegaly. 5. Aortic atherosclerosis. Aortic Atherosclerosis (ICD10-I70.0). Electronically Signed   By: Virgina Norfolk M.D.   On: 05/15/2021 02:29  ? ?IR Fluoro Guide CV Line Right ? ?Result Date: 05/13/2021 ?CLINICAL DATA:  End-stage renal disease and need for tunneled hemodialysis catheter. EXAM: TUNNELED CENTRAL VENOUS HEMODIALYSIS CATHETER  PLACEMENT WITH ULTRASOUND AND FLUOROSCOPIC GUIDANCE ANESTHESIA/SEDATION: Moderate (conscious) sedation was employed during this procedure. A total of Versed 0.5 mg and Fentanyl 25 mcg was administered intravenously by radiology nursing. Moderate Sedation Time: 17 minutes. The patient's level of  consciousness and vital signs were monitored continuously by radiology nursing throughout the procedure under my direct supervision. MEDICATIONS: 2 g IV Ancef. FLUOROSCOPY: 12 seconds.  1.0 mGy PROCEDURE: The procedure, risks, benefits, and alternatives were explained to the patient. Questions regarding the procedure were encouraged and answered. The patient understands and consents to the procedure. A timeout was performed prior to initiating the procedure. The right neck and chest were prepped with chlorhexidine in a sterile fashion, and a sterile drape was applied covering the operative field. Maximum barrier sterile technique with sterile gowns and gloves were used for the procedure. Local anesthesia was provided with 1% lidocaine. Ultrasound was used to confirm patency of the right internal jugular vein. A permanent ultrasound image was recorded. After creating a small venotomy incision, a 21 gauge needle was advanced into the right internal jugular vein under direct, real-time ultrasound guidance. Ultrasound image documentation was performed. After securing guidewire access, an 8 Fr dilator was placed. A J-wire was kinked to measure appropriate catheter length. A Palindrome tunneled hemodialysis catheter measuring 19 cm from tip to cuff was chosen for placement. This was tunneled in a retrograde fashion from the chest wall to the venotomy incision. At the venotomy, serial dilatation was performed and a 15 Fr peel-away sheath was placed over a guidewire. The catheter was then placed through the sheath and the sheath removed. Final catheter positioning was confirmed and documented with a fluoroscopic spot image. The  catheter was aspirated, flushed with saline, and injected with appropriate volume heparin dwells. The venotomy incision was closed with subcuticular 4-0 Vicryl. Dermabond was applied to the incision. The catheter exi

## 2021-05-15 NOTE — NC FL2 (Signed)
?[place] MEDICAID FL2 LEVEL OF CARE SCREENING TOOL  ?  ? ?IDENTIFICATION  ?Patient Name: ?[name] Birthdate: 08-15-1960 Sex: male Admission Date (Current Location): ?05/12/2021  ?[place] and [place] Number: ? [place] ?  Facility and Address:  ?[facility] [place] [place] [address] [place], [place] [address] ?     Provider Number: ?[id number]  ?Attending Physician Name and Address:  ?Lucious [place], DO ? Relative Name and Phone Number:  ?Lysbeth Penner     [phone] ?   ?Current Level of Care: ?Hospital Recommended Level of Care: ?[place] Prior Approval Number: ?  ? ?Date Approved/Denied: ?  PASRR Number: ?[id number] A ? ?Discharge Plan: ?SNF ?  ? ?Current Diagnoses: ?Patient Active Problem List  ? Diagnosis Date Noted  ? Retroperitoneal hematoma   ? ESRD (end stage renal disease) ([place])   ? Acute blood loss anemia 05/12/2021  ? Colon polyps 03/03/2021  ? Mild cognitive impairment with memory loss 02/03/2021  ? Difficulty urinating 02/03/2021  ? Hx of medication noncompliance 09/30/2020  ? Rash 09/22/2020  ? S/P transmetatarsal amputation of foot, left ([place]) 11/23/2019  ? Wound of right leg 11/09/2019  ? CKD stage 4 due to type 2 diabetes mellitus ([place]) 10/27/2018  ? S/P BKA (below knee amputation) unilateral, right ([place]) 08/17/2018  ? Pulmonary nodules/lesions, multiple 02/16/2018  ? Diabetic peripheral neuropathy ([place]) 05/30/2014  ? GERD (gastroesophageal reflux disease) 05/25/2013  ? Diastolic dysfunction 71/24/5809  ? Healthcare maintenance 02/15/2013  ? Chronic cough 06/01/2012  ? PVD (peripheral vascular disease) ([place]) 12/31/2011  ? Type 2 diabetes mellitus ([place]) 03/06/2008  ? HLD (hyperlipidemia) 03/06/2008  ? Essential hypertension 03/06/2008  ? ? ?Orientation RESPIRATION BLADDER Height & Weight   ?  ?Time, Self ? Normal Continent Weight: 162 lb 0.6 oz (73.5 kg) ?Height:     ?BEHAVIORAL SYMPTOMS/MOOD NEUROLOGICAL BOWEL NUTRITION STATUS  ?    Continent Diet (Fluid  restriction; renal)  ?AMBULATORY STATUS COMMUNICATION OF NEEDS Skin   ?Limited Assist Verbally Normal ?  ?  ?  ?    ?     ?     ? ? ?Personal Care Assistance Level of Assistance  ?Bathing, Feeding, Dressing Bathing Assistance: Limited assistance ?Feeding assistance: Independent ?Dressing Assistance: Limited assistance ?   ? ?Functional Limitations Info  ?Sight, Hearing, Speech Sight Info: Adequate ?Hearing Info: Adequate ?Speech Info: Adequate  ? ? ?SPECIAL CARE FACTORS FREQUENCY  ?OT (By licensed OT), PT (By licensed PT)   ?  ?PT Frequency: 5x/ week ?OT Frequency: 5x/ week ?  ?  ?  ?   ? ? ?Contractures Contractures Info: Not present  ? ? ?Additional Factors Info  ?Code Status, Allergies, Insulin Sliding Scale Code Status Info: Full ?Allergies Info: Benicar ?  ?Insulin Sliding Scale Info: see d/c med list ?  ?   ? ?Current Medications (05/15/2021):  This is the current hospital active medication list ?Current Facility-Administered Medications  ?Medication Dose Route Frequency Provider Last Rate Last Admin  ? 0.9 %  sodium chloride infusion (Manually program via Guardrails IV Fluids)   Intravenous Once Loeffler, Adora Fridge, PA-C      ? acetaminophen (TYLENOL) tablet 650 mg  650 mg Oral Q6H PRN Rick Duff, MD   650 mg at 05/15/21 0849  ? Or  ? acetaminophen (TYLENOL) suppository 650 mg  650 mg Rectal Q6H PRN Rick Duff, MD      ? carvedilol (COREG) tablet 12.5 mg  12.5 mg Oral BID WC Alfonse Spruce,  Quan, DO   12.5 mg at 05/15/21 1441  ? Chlorhexidine Gluconate Cloth 2 % PADS 6 each  6 each Topical Q0600 Claudia Desanctis, MD   6 each at 05/15/21 [phone]  ? [START ON 05/21/2021] Darbepoetin Alfa (ARANESP) injection 40 mcg  40 mcg Subcutaneous Q Thu-1800 Claudia Desanctis, MD      ? DULoxetine (CYMBALTA) DR capsule 60 mg  60 mg Oral Daily Rick Duff, MD   60 mg at 05/15/21 1436  ? ezetimibe (ZETIA) tablet 10 mg  10 mg Oral Daily Chandrasekhar, Mahesh A, MD   10 mg at 05/15/21 1437  ? fluticasone (FLONASE) 50 MCG/ACT  nasal spray 1 spray  1 spray Each Nare Daily Rick Duff, MD   1 spray at 05/15/21 1438  ? insulin aspart (novoLOG) injection 0-6 Units  0-6 Units Subcutaneous TID WC Rick Duff, MD   2 Units at 05/14/21 1742  ? insulin glargine-yfgn (SEMGLEE) injection 20 Units  20 Units Subcutaneous QHS Orvis Brill, MD   20 Units at 05/14/21 2142  ? isosorbide mononitrate (IMDUR) 24 hr tablet 15 mg  15 mg Oral Daily Chandrasekhar, Mahesh A, MD   15 mg at 05/15/21 1436  ? nicotine (NICODERM CQ - dosed in mg/24 hours) patch 14 mg  14 mg Transdermal Daily Orvis Brill, MD   14 mg at 05/15/21 1430  ? pantoprazole (PROTONIX) EC tablet 40 mg  40 mg Oral Daily Rick Duff, MD   40 mg at 05/15/21 1437  ? rosuvastatin (CRESTOR) tablet 10 mg  10 mg Oral Daily Rick Duff, MD   10 mg at 05/15/21 1439  ? senna-docusate (Senokot-S) tablet 1 tablet  1 tablet Oral QHS Farrel Gordon, DO   1 tablet at 05/15/21 0448  ? ? ? ?Discharge Medications: ?Please see discharge summary for a list of discharge medications. ? ?Relevant Imaging Results: ? ?Relevant Lab Results: ? ? ?Additional Information ?SSN:  [id number]; Pfizer COVID-19 Vaccine 05/01/2019;  5'11" 162lbs;  New dialysis MWF @ [facility]; arrival time  is 11am on the 1st day to do paperwork; chair time 11:40 ? ?Paulene Floor Raenell Mensing, LCSWA ? ? ? ? ?

## 2021-05-15 NOTE — Care Management Important Message (Signed)
Important Message ? ?Patient Details  ?Name: [name] ?MRN: [id number] ?Date of Birth: 02-06-1960 ? ? ?Medicare Important Message Given:  Yes ? ? ? ? ?[name] ?05/15/2021, 3:02 PM ?

## 2021-05-15 NOTE — Progress Notes (Signed)
? ?Subjective:  ?Patient was seen during his hemodialysis session today.  He endorses an episode last night of his legs giving out on him and hurting his back.  Denies head trauma.  He feels fine and is not in any pain currently, however he feels overwhelmed by recent events leading to his hospitalization here.  States that he no longer has any abdominal pain.  No other complaints or concerns. ? ?Objective: ? ?Vital signs in last 24 hours: ?Vitals:  ? 05/15/21 1000 05/15/21 1030 05/15/21 1100 05/15/21 1130  ?BP: 135/72 140/73 132/66 134/79  ?Pulse: 67 70 69 72  ?Resp:   16   ?Temp:    98.2 ?F (36.8 ?C)  ?TempSrc:    Temporal  ?SpO2:    98%  ?Weight:    73.5 kg  ? ? ?Constitutional: Chronically ill appearing and in no distress while receiving HD ?HENT:  ?Head: Normocephalic and atraumatic.  ?Eyes: EOM are normal.  ?Neck: Normal range of motion.  ?Cardiovascular: Normal rate, regular rhythm, intact distal pulses. No gallop and no friction rub.  ?No murmur heard. No lower extremity edema  ?Pulmonary: Non labored breathing on room air, no wheezing or rales  ?Abdominal: Soft. Normal bowel sounds. Non distended and non tender ?Musculoskeletal: Normal range of motion. S/p R BKA, s/p left transmetatarsal amputation  ?   General: No tenderness or edema.  ?Neurological: Alert and oriented to person, place, and time. Non focal  ?Skin: Skin is warm and dry.  ? ?Assessment/Plan: ? ?Principal Problem: ?  Acute blood loss anemia ? ?#Large retroperitoneal hematoma  ?#Symptomatic anemia, improving ?Patient developed left lower abdominal pain, flank pain, hematuria, and dizziness after a renal biopsy 5/5. Hgb of 6.9 on admit. Imaging here at the hospital showed large retroperitoneal hematoma.  He underwent a CT a of the abdomen and pelvis which showed no evidence of a potential intervenable target for embolization.  His hemoglobin stabilized after 2 units of packed red blood cells.  Most recent hemoglobin of 8.1 today, slightly down  from hemoglobin of 9 two days ago.  We will continue to monitor his hemoglobin while inpatient. ?-IR is on board, appreciate their recommendations ?-Trend CBC ?-Transfuse for Hgb <7 ?-PT recommending home health, OT recommending SNF ? 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  It appears his CKD is secondary to his poorly controlled diabetes and hypertension.  Tunneled cath was placed by IR on 5/10, and he received his first HD treatment after that.  Currently receiving his second HD treatment.  Vascular surgery is performing vein mapping and have plan for AVF placement on 05/15.  CLIP process is underway. ?-IR on board, appreciate their recs ?-Nephrology is on board, appreciate their recommendations ?-Vascular surgery is on board, appreciate their recommendations ?-Avoid nephrotoxic medications ?-Trend BMP ? ?#Type 2 NSTEMI ?#New ischemic cardiomyopathy ?Patient had elevated troponins. ?Component Ref Range & Units 08:44 ?(05/13/21) 03:12 ?(05/13/21) 1 d ago ?(05/12/21) 1 d ago ?(05/12/21) 1 d ago ?(05/12/21) 1 d ago ?(05/12/21) 2 yr ago ?(03/30/19)  ?Troponin I (High Sensitivity) <18 ng/L 1,330 High Panic   1,743 High Panic  CM  1,540 High Panic  CM  1,529 High Panic  CM  1,452 High Panic  CM  1,425 High Panic  CM  16 CM  ? ?His EKG did show ST depression and T wave inversions and lateral leads as well as inferior leads.  This appears to be chronic however.  Given  his acute blood loss anemia, he was thought to have a type II NSTEMI.  Cardiology was consulted overnight and agree with this. May consider intervention if Hgb continues to remain stable.  ?-Rosuvastatin 10 mg p.o. daily ?-Zetia 10 mg p.o. daily ?-Low-dose Imdur ?-Avoiding MRA/SGLT2/ARNI in the setting of ESRD ? ?#Ischemic Hepatitis, improving ?Patient also noted to have elevated transaminases on admission.  This is also likely secondary to patient's acute blood loss anemia.  No laboratory evidence of acute liver  failure. ?-Continue to monitor  ? ?#HTN ?Normotensive on home Coreg and amlodipine.  Does not appear hypervolemic on exam. ?-Continue to monitor ? ?#Poorly controlled type 2 diabetes ?Patient most recent A1c 11.9 (01/2021).  At home he takes glargine 22 units nightly and 11 units 3 times daily with meals.   ?-Semglee 20 units daily at bedtime ?-CBG monitoring ?-SSI ? ?H/o CHF, likely HFpEF ?Appears compensated on exam.  Most recent echo was performed on 5/10, showing EF 30-35% with moderately decreased function of left ventricle and G2DD. ?-CTM ? ?Prior to Admission Living Arrangement: Home with home health ?Anticipated Discharge Location: Home vs SNF ?Barriers to Discharge: CLIP process, placement ?Dispo: Anticipated discharge pending CLIP process, placement ? ?Orvis Brill, MD ?05/15/2021, 2:13 PM ?After 5pm on weekdays and 1pm on weekends: On Call pager [phone]  ?

## 2021-05-15 NOTE — Progress Notes (Signed)
[facility] ?Progress Note ? ?Name: [name] ?MRN: [id number] ?DOB: 09-28-1960 ? ?Chief Complaint:  ?Flank pain ? ?Subjective:  ?He had 250 mL and one unmeasured urine void over 5/11.  Seen and examined on dialysis.  Procedure supervised.  Blood pressure 140/73 and HR 70.  Tolerating goal.  Team is keeping him here to watch him after concern for increase in hematoma size.  ? ?Review of systems:  ?Denies shortness of breath or chest pain ?No n/v ? ? ?Intake/Output Summary (Last 24 hours) at 05/15/2021 1032 ?Last data filed at 05/15/2021 0600 ?Gross per 24 hour  ?Intake 750 ml  ?Output 250 ml  ?Net 500 ml  ? ? ?Vitals:  ?Vitals:  ? 05/15/21 0900 05/15/21 0930 05/15/21 1000 05/15/21 1030  ?BP: 122/72 132/73 135/72 140/73  ?Pulse: 69 69 67 70  ?Resp: 18     ?Temp:      ?TempSrc:      ?SpO2: 100%     ?Weight:      ?  ? ?Physical Exam:     ?General: adult male in stretcher in NAD  ?HEENT: NCAT ?Eyes: EOMI; sclera anicteric ?Neck: supple trachea midline  ?Heart: S1S2 no rub ?Lungs: clear and unlabored; normal work of breathing at rest; on room air ?Abdomen: soft/tender on palpation; somewhat distended; obese habitus ?Extremities: no edema left leg; right BKA with no residual limb edema appreciated ?Skin: no rash on extremities exposed ?Neuro: alert and oriented x 3 provides hx and follows commands  ?Psych normal mood and affect ? ?Medications reviewed    ? ?Labs:  ? ?  Latest Ref Rng & Units 05/15/2021  ?  2:25 AM 05/14/2021  ?  8:39 AM 05/13/2021  ?  3:12 AM  ?BMP  ?Glucose 70 - 99 mg/dL 160   218   250    ?BUN 8 - 23 mg/dL 47   39   69    ?Creatinine 0.61 - 1.24 mg/dL 5.86   4.69   7.43    ?Sodium 135 - 145 mmol/L 129   133   132    ?Potassium 3.5 - 5.1 mmol/L 4.0   3.5   5.0    ?Chloride 98 - 111 mmol/L 98   101   101    ?CO2 22 - 32 mmol/L '21   23   19    '$ ?Calcium 8.9 - 10.3 mg/dL 7.7   7.8   7.8    ? ?Renal biopsy results:  ?-Advanced nodular diabetic glomerulosclerosis, RPS class 4 &  arterionephrosclerosis ?-18/24 glomeruli identified were globally sclerosed ?-Severe interstitial fibrosis (approx 80%) and tubular atrophy ?-[place] red stain negative for amyloid ?-IF negative ?-No evidence of immune complex deposition ? 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 No significant interval change in size of left perinephric hematoma; also note moderate to severe disease of the common iliacs.    ? ? ?Assessment/Plan:  ? 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  CKD is 2/2 DM and HTN.  Now with Cr 8.  Patient with severe stenosis of the proximal right main renal artery and moderate stenosis of the proximal left main renal artery.  I do feel that he has progressed to end-stage renal disease and the need for dialysis.  Tunn catheter on 5/10 with first tx on 5/10 ?- HD today  ?-  Cardiology does not feel inpatient ischemic work-up is needed - per cardiology as long as asymptomatic and Hb stable then he would be acceptable for an AVF as early as today (5/12) or Monday (5/15) ?- have called HD SW to start CLIP process to set up outpatient dialysis unit ?- getting vein mapping - see is scheduled for today  ?- I have consulted [facility] for expedited AVF placement - they are planning on next week (can be as an outpatient if needed)  ? ?# Anemia Acute blood loss and CKD ?- Retroperitoneal hematoma status post kidney biopsy. Iron is replete ?- s/p PRBC's ?- on Aranesp 40 mcg weekly on Thursdays and will need to increase ?  ?# Retroperitoneal hematoma ?- IR is consulted    ?- team is watching him here today after slight increase in size noted ?  ?# Hyperkalemia  ?-temporized  ?- improved with HD ?  ?# Metabolic acidosis ?- 2/2 advanced CKD/ESRD ?- starting HD  ?  ?# HTN  ?- controlled   ?- optimize volume with HD  ?  ?# Type 2 DM  ?- Uncontrolled  ?- per primary team  ?   ?# Elevated troponin ?- Per primary team  ?- per cardiology ok for avf if Hb stable and asymptomatic  ?  ?# Metabolic bone disease ?- intact PTH is 190.  mild hyperphos - manage with HD for now  ?  ?Disposition - continue inpatient monitoring ? ?Claudia Desanctis, MD ?05/15/2021 10:48 AM ? ? ? ? ? ?

## 2021-05-15 NOTE — Progress Notes (Signed)
Paged by RN due to new onset, 9/10 sharp L flank pain and patient requesting pain medication stronger than tylenol.  ? ?I evaluated the patient at bedside and he seemed somewhat confused, thinking that his current admission was the same as the admission where he underwent renal biopsy, and stating both that he has had pain since the procedure that has been severe as well as that he has not had pain. ? ?Physical exam remarkable for mild diffuse tenderness to palpation of the abdomen without guarding, abdomen is soft. No flank pain on L when palpated. No hematoma noted. Vital signs show gradual decline in BP over the last 1-2 days, no with SBP<110 during exam. He was started on home antihypertensive nearly 2 days ago and was normotensive. ? ?I have changed morning lab draws for CBC and BMP to STAT now, and ordered CT abdomen/pelvis to rule out expanding hematoma (previously noted to have stabilized without need for intervention). If this is observed, I will call IR. I have also given him a 1 mg dose of dilaudid PO to help with his pain in the meantime. ? ?Will continue to monitor closely. He is at risk of rapid decompensation. ? ? ?Farrel Gordon, DO ?

## 2021-05-15 NOTE — TOC Progression Note (Signed)
Transition of Care (TOC) - Initial/Assessment Note  ? ? ?Patient Details  ?Name: [name] ?MRN: [id number] ?Date of Birth: 1960/10/08 ? ?Transition of Care (TOC) CM/SW Contact:    ?Paulene Floor Jaena Brocato, LCSWA ?Phone Number: ?05/15/2021, 2:19 PM ? ?Clinical Narrative:                 ?CSW contacted PT and OT to inquire about disposition as PT recommended HH and OT SNF.  PT informed CSW that the patient will be re-evaluated this afternoon.  (The patient was in dialysis this morning.) ? ?CSW received a call from the patient's friend [name] (see emergency contact list for phone number).  Ms. [name] called to inquire about discharge planning for patient and reports that the family feels that the patient may need SNF.  CSW informed Ms. [name] that PT will re-evaluate the patient this afternoon.     ? ? ?Patient Goals and CMS Choice ?  ?  ?  ? ?Expected Discharge Plan and Services ?  ?  ?  ?  ?  ?                ?  ?  ?  ?  ?  ?  ?  ?  ?  ?  ? ?Prior Living Arrangements/Services ?  ?  ?  ?       ?  ?  ?  ?  ? ?Activities of Daily Living ?Home Assistive Devices/Equipment: None, Prosthesis ?ADL Screening (condition at time of admission) ?Patient's cognitive ability adequate to safely complete daily activities?: Yes ?Is the patient deaf or have difficulty hearing?: No ?Does the patient have difficulty seeing, even when wearing glasses/contacts?: No ?Does the patient have difficulty concentrating, remembering, or making decisions?: No ?Patient able to express need for assistance with ADLs?: Yes ?Does the patient have difficulty dressing or bathing?: No ?Independently performs ADLs?: Yes (appropriate for developmental age) ?Does the patient have difficulty walking or climbing stairs?: No ?Weakness of Legs: None ?Weakness of Arms/Hands: None ? ?Permission Sought/Granted ?  ?  ?   ?   ?   ?   ? ?Emotional Assessment ?  ?  ?  ?  ?  ?  ? ?Admission diagnosis:  Acute blood loss anemia [D62] ?Retroperitoneal hematoma  [K66.1] ?Acute cystitis with hematuria [N30.01] ?Acute renal failure superimposed on stage 4 chronic kidney disease, unspecified acute renal failure type ([place]) [N17.9, N18.4] ?Patient Active Problem List  ? Diagnosis Date Noted  ? Retroperitoneal hematoma   ? ESRD (end stage renal disease) ([place])   ? Acute blood loss anemia 05/12/2021  ? Colon polyps 03/03/2021  ? Mild cognitive impairment with memory loss 02/03/2021  ? Difficulty urinating 02/03/2021  ? Hx of medication noncompliance 09/30/2020  ? Rash 09/22/2020  ? S/P transmetatarsal amputation of foot, left ([place]) 11/23/2019  ? Wound of right leg 11/09/2019  ? CKD stage 4 due to type 2 diabetes mellitus ([place]) 10/27/2018  ? S/P BKA (below knee amputation) unilateral, right ([place]) 08/17/2018  ? Pulmonary nodules/lesions, multiple 02/16/2018  ? Diabetic peripheral neuropathy ([place]) 05/30/2014  ? GERD (gastroesophageal reflux disease) 05/25/2013  ? Diastolic dysfunction 97/58/8325  ? Healthcare maintenance 02/15/2013  ? Chronic cough 06/01/2012  ? PVD (peripheral vascular disease) ([place]) 12/31/2011  ? Type 2 diabetes mellitus ([place]) 03/06/2008  ? HLD (hyperlipidemia) 03/06/2008  ? Essential hypertension 03/06/2008  ? ?PCP:  [name], MD ?Pharmacy:   ?[facility] ?[address] ?[place]  [address] ?Phone: [phone] Fax: [phone] ? ?[name] ?[address] ?[place] [address] ?Phone: [phone] Fax: [phone] ? ?[facility] ?[address] ?[place] [address] ?Phone: [phone] Fax: [phone] ? ?[facility] [place] [facility] ?[place] [address] ?Phone: [phone] Fax: [phone] ? ? ? ? ?Social Determinants of Health (SDOH) Interventions ?  ? ?Readmission Risk Interventions ?   ? View : No data to display.  ?  ?  ?  ? ? ? ?

## 2021-05-15 NOTE — Progress Notes (Addendum)
Awaiting final determination for pt's out-pt HD clinic/schedule. Case discussed with nephrologist.  ? ?[name] ?Renal Navigator ?[phone] ? ? ?Addendum at 3:18 pm: ?Pt has been accepted at [facility] on MWF with 11:40 chair time. Pt can start on Monday, May 15 if pt stable for d/c over the weekend. Pt will need to arrive at 11:00 for first appt in order to complete paperwork prior to treatment. Met with pt at bedside to discuss details. Pt also provided schedule letter. Pt agreeable to plans and request that navigator contact pt's children's mother, Deloris, to discuss details as well. Spoke to [employer] via phone and she is aware/agreeable to arrangements as well. Pt voices that he may be going to snf for rehab at d/c. Update provided to nephrologist and documented arrangements on AVS as well.  ?

## 2021-05-15 NOTE — Progress Notes (Signed)
Physical Therapy Treatment ?Patient Details ?Name: [name] ?MRN: [id number] ?DOB: [birth year] ?Today's Date: 05/15/2021 ? ? ?History of Present Illness Pt is a 61 y.o. M who presents with fatigue, hematuria, and L flank pain. Pt had L renal biopsy 5/5; developed hematuria and abdominal/back pain around the left kidney 5/9. CT scan revealed a large RP hematoma; Hgb 7 from baseline 12. Also developed Type II NSTEMI due to acute severe anemia.  Now on HD with significant medical history of PAD s/p stent and left midfoot amputation, right BKA,  DM, HTN, CKD5 p/w RP bleed following renal biopsy ? ?  ?PT Comments  ? ? Patient seen after HD session this AM. Patient severely limited by fatigue and poor activity tolerance. Patient required x3 rest breaks while donning pants, shirt, and prosthesis. Provided max encouragement to participate in OOB to chair this session. Required minA for sit to stand transfer and able to take steps towards recliner with min guard and RW. Patient declined further mobility due to fatigue. Updated recommendation to SNF at discharge as patient is unable to manage at home alone as he was PTA when he was independent.  ?  ?Recommendations for follow up therapy are one component of a multi-disciplinary discharge planning process, led by the attending physician.  Recommendations may be updated based on patient status, additional functional criteria and insurance authorization. ? ?Follow Up Recommendations ? Skilled nursing-short term rehab (<3 hours/day) ?  ?  ?Assistance Recommended at Discharge Frequent or constant Supervision/Assistance  ?Patient can return home with the following A little help with walking and/or transfers;Assistance with cooking/housework;Direct supervision/assist for medications management;Direct supervision/assist for financial management;Assist for transportation;Help with stairs or ramp for entrance ?  ?Equipment Recommendations ? Rolling Amana Bouska (2 wheels)  ?  ?Recommendations  for Other Services   ? ? ?  ?Precautions / Restrictions Precautions ?Precautions: Fall;Other (comment) ?Precaution Comments: R BKA (has prosthetic in room), hx of L forefoot amputation ?Restrictions ?Weight Bearing Restrictions: No  ?  ? ?Mobility ? Bed Mobility ?Overal bed mobility: Needs Assistance ?Bed Mobility: Supine to Sit ?  ?  ?Supine to sit: Min assist ?  ?  ?General bed mobility comments: reaching for therapist hand to assist. Heavy use of momentum to come into sitting. Performed x 3 during session due to fatigue with donning pants, shirt and prosthesis. Rest break between each piece of clothing ?  ? ?Transfers ?Overall transfer level: Needs assistance ?Equipment used: Rolling Dorraine Ellender (2 wheels) ?Transfers: Sit to/from Stand, Bed to chair/wheelchair/BSC ?Sit to Stand: Min assist ?  ?Step pivot transfers: Min guard ?  ?  ?  ?General transfer comment: cues for hand placement. minA to rise and steady with prosthesis and elevated bed height. Able to take steps towards recliner with min guard. Patient deferred further mobility due to fatigue ?  ? ?Ambulation/Gait ?  ?  ?  ?  ?  ?  ?  ?  ? ? ?Stairs ?  ?  ?  ?  ?  ? ? ?Wheelchair Mobility ?  ? ?Modified Rankin (Stroke Patients Only) ?  ? ? ?  ?Balance Overall balance assessment: Needs assistance ?Sitting-balance support: Feet supported ?Sitting balance-Leahy Scale: Good ?  ?  ?Standing balance support: Bilateral upper extremity supported, Reliant on assistive device for balance ?Standing balance-Leahy Scale: Poor ?Standing balance comment: reliant on UE support ?  ?  ?  ?  ?  ?  ?  ?  ?  ?  ?  ?  ? ?  ?  Cognition Arousal/Alertness: Lethargic ?Behavior During Therapy: Flat affect ?Overall Cognitive Status: Impaired/Different from baseline ?Area of Impairment: Safety/judgement, Awareness ?  ?  ?  ?  ?  ?  ?  ?  ?  ?  ?Memory: Decreased short-term memory ?  ?Safety/Judgement: Decreased awareness of safety, Decreased awareness of deficits ?Awareness: Emergent ?  ?  ?   ?  ? ?  ?Exercises   ? ?  ?General Comments   ?  ?  ? ?Pertinent Vitals/Pain Pain Assessment ?Pain Assessment: Faces ?Faces Pain Scale: Hurts little more ?Pain Location: back ?Pain Descriptors / Indicators: Discomfort, Grimacing ?Pain Intervention(s): Monitored during session  ? ? ?Home Living   ?  ?  ?  ?  ?  ?  ?  ?  ?  ?   ?  ?Prior Function    ?  ?  ?   ? ?PT Goals (current goals can now be found in the care plan section) Acute Rehab PT Goals ?Patient Stated Goal: to get stronger ?PT Goal Formulation: With patient ?Time For Goal Achievement: 05/27/21 ?Potential to Achieve Goals: Fair ?Progress towards PT goals: Progressing toward goals ? ?  ?Frequency ? ? ? Min 3X/week ? ? ? ?  ?PT Plan Discharge plan needs to be updated  ? ? ?Co-evaluation   ?  ?  ?  ?  ? ?  ?AM-PAC PT "6 Clicks" Mobility   ?Outcome Measure ? Help needed turning from your back to your side while in a flat bed without using bedrails?: A Little ?Help needed moving from lying on your back to sitting on the side of a flat bed without using bedrails?: A Little ?Help needed moving to and from a bed to a chair (including a wheelchair)?: A Little ?Help needed standing up from a chair using your arms (e.g., wheelchair or bedside chair)?: A Little ?Help needed to walk in hospital room?: A Lot ?Help needed climbing 3-5 steps with a railing? : Total ?6 Click Score: 15 ? ?  ?End of Session Equipment Utilized During Treatment: Gait belt ?Activity Tolerance: Patient limited by fatigue ?Patient left: in chair;with call bell/phone within reach ?Nurse Communication: Mobility status ?PT Visit Diagnosis: Difficulty in walking, not elsewhere classified (R26.2);Muscle weakness (generalized) (M62.81) ?  ? ? ?Time: 2409-7353 ?PT Time Calculation (min) (ACUTE ONLY): 35 min ? ?Charges:  $Therapeutic Activity: 23-37 mins          ?          ? ?Suda Forbess A. Gilford Rile, PT, DPT ?Acute Rehabilitation Services ?Pager [phone] ?Office [phone] ? ? ? ?[name] ?05/15/2021, 3:09 PM ? ?

## 2021-05-16 ENCOUNTER — Inpatient Hospital Stay (HOSPITAL_COMMUNITY): Payer: Medicare Other

## 2021-05-16 DIAGNOSIS — N186 End stage renal disease: Secondary | ICD-10-CM

## 2021-05-16 LAB — GLUCOSE, CAPILLARY
Glucose-Capillary: 101 mg/dL — ABNORMAL HIGH (ref 70–99)
Glucose-Capillary: 79 mg/dL (ref 70–99)
Glucose-Capillary: 73 mg/dL (ref 70–99)
Glucose-Capillary: 57 mg/dL — ABNORMAL LOW (ref 70–99)
Glucose-Capillary: 74 mg/dL (ref 70–99)
Glucose-Capillary: 136 mg/dL — ABNORMAL HIGH (ref 70–99)

## 2021-05-16 LAB — CBC
HCT: 25.5 % — ABNORMAL LOW (ref 39.0–52.0)
Hemoglobin: 8.3 g/dL — ABNORMAL LOW (ref 13.0–17.0)
MCH: 28.7 pg (ref 26.0–34.0)
MCHC: 32.5 g/dL (ref 30.0–36.0)
MCV: 88.2 fL (ref 80.0–100.0)
Platelets: 205 10*3/uL (ref 150–400)
RBC: 2.89 MIL/uL — ABNORMAL LOW (ref 4.22–5.81)
RDW: 15.1 % (ref 11.5–15.5)
WBC: 8.1 10*3/uL (ref 4.0–10.5)
nRBC: 0.6 % — ABNORMAL HIGH (ref 0.0–0.2)

## 2021-05-16 LAB — BASIC METABOLIC PANEL
Anion gap: 11 (ref 5–15)
BUN: 30 mg/dL — ABNORMAL HIGH (ref 8–23)
CO2: 24 mmol/L (ref 22–32)
Calcium: 7.6 mg/dL — ABNORMAL LOW (ref 8.9–10.3)
Chloride: 98 mmol/L (ref 98–111)
Creatinine, Ser: 5.07 mg/dL — ABNORMAL HIGH (ref 0.61–1.24)
GFR, Estimated: 12 mL/min — ABNORMAL LOW (ref >60)
Glucose, Bld: 96 mg/dL (ref 70–99)
Potassium: 3.5 mmol/L (ref 3.5–5.1)
Sodium: 133 mmol/L — ABNORMAL LOW (ref 135–145)

## 2021-05-16 MED ORDER — DARBEPOETIN ALFA 100 MCG/0.5ML IJ SOSY: 100.0000 ug | PREFILLED_SYRINGE | INTRAMUSCULAR | Status: DC

## 2021-05-16 MED ORDER — MELATONIN 3 MG PO TABS
3.0000 mg | ORAL_TABLET | Freq: Every day | ORAL | Status: DC
  Administered 2021-05-17 – 2021-05-20 (×5): 3 mg via ORAL
  Filled 2021-05-16 (×5): qty 1

## 2021-05-16 NOTE — Progress Notes (Signed)
61 year old male that vascular surgery was consulted for permanent hemodialysis access.  Plan was AV fistula on Monday with me.  I was contacted by Dr. Royce Macadamia with nephrology asking that we delay his surgery with outpatient follow-up.  This is in the setting of retroperitoneal bleed as well as NSTEMI.  I will cancel his case for Monday.  I will send the office a message for follow-up in about 4 weeks with vein mapping.  Please call vascular if we can be of further assistance. ? ?Marty Heck, MD ?[facility] ?Office: [phone] ? ?Marty Heck ? ?

## 2021-05-16 NOTE — Progress Notes (Signed)
? ?Subjective:  ?Patient was seen this AM. He notes he had a long night and that his back and stomach continue to hurt. Patient is eager to get home.  ? ?Objective: ? ?Vital signs in last 24 hours: ?Vitals:  ? 05/15/21 1647 05/15/21 2134 05/16/21 0200 05/16/21 0536  ?BP: 114/63 99/62  (!) 111/58  ?Pulse: 71 62  62  ?Resp: '18 17  18  '$ ?Temp: (!) 97.5 ?F (36.4 ?C) (!) 97.4 ?F (36.3 ?C)  97.9 ?F (36.6 ?C)  ?TempSrc: Oral Oral  Oral  ?SpO2: 100% 99%  97%  ?Weight:   73.5 kg   ?Height:   '5\' 11"'$  (1.803 m)   ? ? ?Constitutional: Chronically ill appearing, lying in bed and in no distress ?HENT:  ?Head: Normocephalic and atraumatic.  ?Eyes: EOM are normal.  ?Neck: Normal range of motion.  ?Cardiovascular: Normal rate, regular rhythm, intact distal pulses. No gallop and no friction rub.  ?No murmur heard. No lower extremity edema  ?Pulmonary: Non labored breathing on room air, no wheezing or rales  ?Abdominal: Soft. Normal bowel sounds. Non distended and non tender ?Musculoskeletal: Normal range of motion. S/p R BKA, s/p left transmetatarsal amputation  ?   General: No tenderness or edema.  ?Neurological: Alert and oriented to person, place, and time. Non focal  ?Skin: Skin is warm and dry.  ? ? ?Assessment/Plan: ? ?Principal Problem: ?  Acute blood loss anemia ?Active Problems: ?  ESRD on hemodialysis ([place]) ? ?#Large retroperitoneal hematoma  ?#Symptomatic anemia, improving ?Patient developed left lower abdominal pain, flank pain, hematuria, and dizziness after a renal biopsy 5/5 with imaging revealing a large retroperitoneal hematoma.  No intervenable targets for embolization on CT abdomen and pelvis.  Received 2 units PRBCs, hemoglobin has been stable, most recently at 8.3.  Discharge pending SNF placement. ?-IR is on board, appreciate their recommendations ?-Trend CBC ?-Transfuse for Hgb <7 ?-PT and OT recommending SNF ? ?#H/o CKD stage IV with recent progression to ESRD ?It appears his CKD is secondary to his poorly  controlled diabetes and hypertension.  Tunneled cath was placed by IR on 5/10, has been receiving HD tx in the hospital.  Vascular surgery is performing vein mapping and have plan for AVF placement on 5/15.  Otherwise, patient has been accepted at [facility] as outpatient for MWF sessions. ?-Nephrology is on board, appreciate their recommendations ?-Vascular surgery is on board, appreciate their recommendations ?-Avoid nephrotoxic medications ?-Trend BMP ? ?#Type 2 NSTEMI ?#New ischemic cardiomyopathy ?Patient presented with elevated troponins, peaked at 1700 and subsequently down trended. Given his acute blood loss anemia, he was thought to have a type II NSTEMI. Cardiology was consulted and agree with this. May consider intervention if Hgb continues to remain stable.  ?-Rosuvastatin 10 mg p.o. daily ?-Zetia 10 mg p.o. daily ?-Low-dose Imdur ?-Avoiding MRA/SGLT2/ARNI in the setting of ESRD ? ?#HTN ?Normotensive on home Coreg and amlodipine. Does not appear hypervolemic on exam. ?-Continue to monitor ? ?#Poorly controlled type 2 diabetes ?Patient most recent A1c 11.9 (01/2021).  At home he takes glargine 22 units nightly and 11 units 3 times daily with meals.   ?-Semglee 20 units daily at bedtime ?-CBG monitoring ?-SSI ? ?H/o CHF, likely HFpEF ?Appears compensated on exam.  Most recent echo was performed on 5/10, showing EF 30-35% with moderately decreased function of left ventricle and G2DD. ?-CTM ? ?Prior to Admission Living Arrangement: Home ?Anticipated Discharge Location: SNF ?Barriers to Discharge: Placement, likely SNF ?Dispo: Anticipated discharge pending placement ? ?  Orvis Brill, MD ?05/16/2021, 7:04 AM ?Pager: [phone]  ?After 5pm on weekdays and 1pm on weekends: On Call pager [phone]  ?

## 2021-05-16 NOTE — Plan of Care (Signed)
?  Problem: Education: ?Goal: Knowledge of disease and its progression will improve ?Outcome: Completed/Met ?  ?

## 2021-05-16 NOTE — Progress Notes (Signed)
Pre-op vein mapping has been completed. ? ?Results can be found under chart review under CV PROC. ?05/16/2021 1:03 PM ?Miguelina Fore RVT, RDMS ? ?

## 2021-05-16 NOTE — Progress Notes (Signed)
[facility] ?Progress Note ? ?Name: [name] ?MRN: [id number] ?DOB: 07/03/1960 ? ?Chief Complaint:  ?Flank pain ? ?Subjective:  ?He has no urine output documented over 5/12.  Per charting PT/OT recommending SNF.  He is having some abdominal and back pain today.   His team is looking in to this.  Helped him to turn on the TV.  ? ?Review of systems:  ?Denies shortness of breath or chest pain ?He reports nausea without vomiting.  ? ? ?Intake/Output Summary (Last 24 hours) at 05/16/2021 1013 ?Last data filed at 05/16/2021 0900 ?Gross per 24 hour  ?Intake 597 ml  ?Output 500 ml  ?Net 97 ml  ? ? ?Vitals:  ?Vitals:  ? 05/15/21 2134 05/16/21 0200 05/16/21 0536 05/16/21 0859  ?BP: 99/62  (!) 111/58 116/80  ?Pulse: 62  62 64  ?Resp: '17  18 18  '$ ?Temp: (!) 97.4 ?F (36.3 ?C)  97.9 ?F (36.6 ?C) (!) 97.5 ?F (36.4 ?C)  ?TempSrc: Oral  Oral Oral  ?SpO2: 99%  97% 98%  ?Weight:  73.5 kg    ?Height:  '5\' 11"'$  (1.803 m)    ?  ? ?Physical Exam:      ?General: adult male in stretcher in NAD  ?HEENT: NCAT ?Eyes: EOMI; sclera anicteric ?Neck: supple trachea midline  ?Heart: S1S2 no rub ?Lungs: clear and unlabored; normal work of breathing at rest; on room air ?Abdomen: soft/tender on palpation; somewhat distended; obese habitus ?Extremities: no edema left leg; right BKA with no residual limb edema Skin: no rash on extremities exposed ?Neuro: alert and oriented x 3 provides hx and follows commands  ?Psych normal mood and affect ?Access RIJ tunn catheter  ? ?Medications reviewed    ? ?Labs:  ? ?  Latest Ref Rng & Units 05/16/2021  ?  3:26 AM 05/15/2021  ?  2:25 AM 05/14/2021  ?  8:39 AM  ?BMP  ?Glucose 70 - 99 mg/dL 96   160   218    ?BUN 8 - 23 mg/dL 30   47   39    ?Creatinine 0.61 - 1.24 mg/dL 5.07   5.86   4.69    ?Sodium 135 - 145 mmol/L 133   129   133    ?Potassium 3.5 - 5.1 mmol/L 3.5   4.0   3.5    ?Chloride 98 - 111 mmol/L 98   98   101    ?CO2 22 - 32 mmol/L '24   21   23    '$ ?Calcium 8.9 - 10.3 mg/dL 7.6   7.7   7.8     ? ?Renal biopsy results:  ?-Advanced nodular diabetic glomerulosclerosis, RPS class 4 & arterionephrosclerosis ?-18/24 glomeruli identified were globally sclerosed ?-Severe interstitial fibrosis (approx 80%) and tubular atrophy ?-[place] red stain negative for amyloid ?-IF negative ?-No evidence of immune complex deposition ? 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 No significant interval change in size of left perinephric hematoma; also note moderate to severe disease of the common iliacs.    ? ? ?Assessment/Plan:  ? 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  CKD is 2/2 DM and HTN.  Now with Cr 8.  Patient with severe stenosis of the proximal right main renal artery and moderate stenosis of the proximal left main renal artery.  I do feel that  he has progressed to end-stage renal disease and the need for dialysis.  S/p tunn catheter on 5/10 with first tx on 5/10 ?- HD per MWF schedule ?- getting vein mapping - not yet done   ?- I have consulted [facility] for expedited AVF placement - they are going to set up follow-up in clinic in a couple of weeks and then plan for AVF.  I discussed with Dr. Carlis Abbott today and we took off of the schedule for Monday, 5/15 ?- upon discharge he will be MWF schedule at [facility] for HD  ? ?# Anemia Acute blood loss and CKD ?- Retroperitoneal hematoma status post kidney biopsy. Iron is replete ?- s/p PRBC's ?- increased aranesp to 100 mcg weekly on Thursdays  ?  ?# Retroperitoneal hematoma ?- IR was consulted    ?- slight increase in size on last imaging  ?  ?# Hyperkalemia  ?- temporized  ?- improved with HD ?  ?# Metabolic acidosis ?- 2/2 advanced CKD/ESRD ?- starting HD  ?  ?# HTN  ?- controlled   ?- optimize volume with HD  ?  ?# Type 2 DM  ?- Uncontrolled  ?- per primary team  ?  ?# NSTEMI ?- Per primary team  ?- moving AVF placement to  outpatient  ?  ?# Metabolic bone disease ?- intact PTH is 190.  mild hyperphos - manage with HD for now  ?- phos in am ?  ?Disposition - per primary team.  Has an outpatient HD unit and is now ESRD ? ?Claudia Desanctis, MD ?05/16/2021 10:37 AM ? ? ? ? ? ? ? ?

## 2021-05-17 DIAGNOSIS — D62 Acute posthemorrhagic anemia: Secondary | ICD-10-CM | POA: Diagnosis not present

## 2021-05-17 LAB — CBC
HCT: 27.8 % — ABNORMAL LOW (ref 39.0–52.0)
Hemoglobin: 9.2 g/dL — ABNORMAL LOW (ref 13.0–17.0)
MCH: 28.9 pg (ref 26.0–34.0)
MCHC: 33.1 g/dL (ref 30.0–36.0)
MCV: 87.4 fL (ref 80.0–100.0)
Platelets: 227 10*3/uL (ref 150–400)
RBC: 3.18 MIL/uL — ABNORMAL LOW (ref 4.22–5.81)
RDW: 14.7 % (ref 11.5–15.5)
WBC: 9 10*3/uL (ref 4.0–10.5)
nRBC: 0 % (ref 0.0–0.2)

## 2021-05-17 LAB — BASIC METABOLIC PANEL
Anion gap: 11 (ref 5–15)
BUN: 35 mg/dL — ABNORMAL HIGH (ref 8–23)
CO2: 22 mmol/L (ref 22–32)
Calcium: 7.5 mg/dL — ABNORMAL LOW (ref 8.9–10.3)
Chloride: 97 mmol/L — ABNORMAL LOW (ref 98–111)
Creatinine, Ser: 5.87 mg/dL — ABNORMAL HIGH (ref 0.61–1.24)
GFR, Estimated: 10 mL/min — ABNORMAL LOW (ref >60)
Glucose, Bld: 125 mg/dL — ABNORMAL HIGH (ref 70–99)
Potassium: 3.6 mmol/L (ref 3.5–5.1)
Sodium: 130 mmol/L — ABNORMAL LOW (ref 135–145)

## 2021-05-17 LAB — GLUCOSE, CAPILLARY
Glucose-Capillary: 105 mg/dL — ABNORMAL HIGH (ref 70–99)
Glucose-Capillary: 88 mg/dL (ref 70–99)
Glucose-Capillary: 94 mg/dL (ref 70–99)

## 2021-05-17 LAB — PHOSPHORUS: Phosphorus: 5.2 mg/dL — ABNORMAL HIGH (ref 2.5–4.6)

## 2021-05-17 MED ORDER — CHLORHEXIDINE GLUCONATE CLOTH 2 % EX PADS
6.0000 | MEDICATED_PAD | Freq: Every day | CUTANEOUS | Status: DC
  Administered 2021-05-18 – 2021-05-20 (×2): 6 via TOPICAL

## 2021-05-17 NOTE — Plan of Care (Signed)
?  Problem: Clinical Measurements: ?Goal: Respiratory complications will improve ?Outcome: Progressing ?  ?Problem: Clinical Measurements: ?Goal: Cardiovascular complication will be avoided ?Outcome: Progressing ?  ?Problem: Nutrition: ?Goal: Adequate nutrition will be maintained ?Outcome: Not Progressing ? Patient reported does not like the food here ?

## 2021-05-17 NOTE — Progress Notes (Signed)
SW following for SNF placement. Pt currently with no bed offers and one denial from [employer]. SNF search expanded. Anticipate additional SNF responses by Monday. SW will continue to follow and provide updates as available.  ? ?Wandra Feinstein, MSW, LCSW ?[phone] (coverage) ? ? ?

## 2021-05-17 NOTE — Progress Notes (Signed)
[facility] ?Progress Note ? ?Name: [name] ?MRN: [id number] ?DOB: 21-Jun-1960 ? ?Chief Complaint:  ?Flank pain ? ?Subjective:  ?He has 400 mL urine output over 5/13.  Per charting PT/OT recommending SNF; here waiting on same.  Last HD on 5/12 with 0.5 kg UF.  States that he's doing better this am.  ? ?Review of systems:   ?Denies shortness of breath or chest pain ?Denies n/v ?He denies any abd pain today ?Denies back pain today ? ? ?Intake/Output Summary (Last 24 hours) at 05/17/2021 1021 ?Last data filed at 05/17/2021 0600 ?Gross per 24 hour  ?Intake 600 ml  ?Output 400 ml  ?Net 200 ml  ? ? ?Vitals:  ?Vitals:  ? 05/16/21 1732 05/16/21 2042 05/17/21 0425 05/17/21 3790  ?BP: 135/76 (!) 143/69 128/69 126/72  ?Pulse: 72 71  62  ?Resp: '15 14 14 16  '$ ?Temp:  97.9 ?F (36.6 ?C) 98.7 ?F (37.1 ?C) (!) 97.4 ?F (36.3 ?C)  ?TempSrc:  Oral Oral Oral  ?SpO2: 98% 97% 98% 100%  ?Weight:      ?Height:      ?  ? ?Physical Exam:       ?General: adult male in stretcher in NAD  ?HEENT: NCAT ?Eyes: EOMI; sclera anicteric ?Neck: supple trachea midline  ?Heart: S1S2 no rub ?Lungs: clear and unlabored; normal work of breathing at rest; on room air ?Abdomen: soft/nontender on palpation; somewhat distended; obese habitus ?Extremities: no edema left leg; right BKA with no residual limb edema Skin: no rash on extremities exposed ?Neuro: alert and oriented x 3 provides hx and follows commands; does take a while to come up with the guess for the year ?Psych normal mood and affect ?Access RIJ tunn catheter  ? ?Medications reviewed    ? ?Labs:  ? ?  Latest Ref Rng & Units 05/17/2021  ?  3:10 AM 05/16/2021  ?  3:26 AM 05/15/2021  ?  2:25 AM  ?BMP  ?Glucose 70 - 99 mg/dL 125   96   160    ?BUN 8 - 23 mg/dL 35   30   47    ?Creatinine 0.61 - 1.24 mg/dL 5.87   5.07   5.86    ?Sodium 135 - 145 mmol/L 130   133   129    ?Potassium 3.5 - 5.1 mmol/L 3.6   3.5   4.0    ?Chloride 98 - 111 mmol/L 97   98   98    ?CO2 22 - 32 mmol/L '22   24   21     '$ ?Calcium 8.9 - 10.3 mg/dL 7.5   7.6   7.7    ? ?Renal biopsy results:  ?-Advanced nodular diabetic glomerulosclerosis, RPS class 4 & arterionephrosclerosis ?-18/24 glomeruli identified were globally sclerosed ?-Severe interstitial fibrosis (approx 80%) and tubular atrophy ?-[place] red stain negative for amyloid ?-IF negative ?-No evidence of immune complex deposition ? 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 No significant interval change in size of left perinephric hematoma; also note moderate to severe disease of the common iliacs.    ? ? ?Assessment/Plan:  ? 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  CKD is 2/2 DM and HTN.  Now with Cr 8.  Patient with severe stenosis of the proximal right main renal artery and moderate stenosis of the proximal  left main renal artery.  I do feel that he has progressed to end-stage renal disease and the need for dialysis.  S/p tunn catheter on 5/10 with first tx on 5/10 ?- HD per MWF schedule ?- vein mapping was done   ?- [facility] will set up follow-up in clinic in a couple of weeks and then plan for AVF.  We took off of the schedule for Monday, 5/15 given recent NSTEMI  ?- upon discharge he will be MWF schedule at [facility] for HD  ? ?# Anemia Acute blood loss and CKD ?- Retroperitoneal hematoma status post kidney biopsy. Iron is replete ?- s/p PRBC's ?- increased aranesp to 100 mcg weekly on Thursdays  ?  ?# Retroperitoneal hematoma ?- IR was consulted    ?- slight increase in size on last imaging  ?- team is monitoring ?  ?# Hyperkalemia  ?- now resolved with HD  ?  ?# Metabolic acidosis ?- 2/2 advanced CKD/ESRD ?- managed with HD  ?  ?# HTN  ?- controlled   ?- optimize volume with HD  ?  ?# Type 2 DM  ?- Uncontrolled  ?- per primary team  ?  ?# NSTEMI ?- Per primary team  ?- we have moved AVF placement to outpatient  ?  ?# Metabolic  bone disease ?- intact PTH is 190.  mild hyperphos - manage with HD for now ?  ?Disposition - per primary team.  Has an outpatient HD unit and is now ESRD ? ?Claudia Desanctis, MD ?05/17/2021 10:32 AM ? ? ? ? ? ? ? ? ?

## 2021-05-17 NOTE — Progress Notes (Signed)
? ?Subjective:  ?Patient was seen this AM. He denies any pain at this time, states that he just feels weak and tired. No other concerns. ? ?Objective: ? ?Vital signs in last 24 hours: ?Vitals:  ? 05/16/21 1257 05/16/21 1732 05/16/21 2042 05/17/21 0425  ?BP: 126/81 135/76 (!) 143/69 128/69  ?Pulse: 66 72 71   ?Resp: '16 15 14 14  '$ ?Temp: 97.7 ?F (36.5 ?C)  97.9 ?F (36.6 ?C) 98.7 ?F (37.1 ?C)  ?TempSrc: Oral  Oral Oral  ?SpO2: 95% 98% 97% 98%  ?Weight:      ?Height:      ? ? ?Constitutional: Chronically ill appearing, lying in bed and in no distress ?HENT:  ?Head: Normocephalic and atraumatic.  ?Eyes: EOM are normal.  ?Neck: Normal range of motion.  ?Cardiovascular: Normal rate, regular rhythm, intact distal pulses. No gallop and no friction rub.  ?No murmur heard. No lower extremity edema  ?Pulmonary: Non labored breathing on room air, no wheezing or rales  ?Abdominal: Soft. Normal bowel sounds. Non distended and non tender ?Musculoskeletal: Normal range of motion. S/p R BKA, s/p left transmetatarsal amputation  ?   General: No tenderness or edema.  ?Neurological: Alert and oriented to person, place, and time. Non focal  ?Skin: Skin is warm and dry.  ? ? ?Assessment/Plan: ? ?Principal Problem: ?  Acute blood loss anemia ?Active Problems: ?  ESRD on hemodialysis ([place]) ? ?#Large retroperitoneal hematoma  ?#Symptomatic anemia, improving ?Patient developed left lower abdominal pain, flank pain, hematuria, and dizziness after a renal biopsy 5/5 with imaging revealing a large retroperitoneal hematoma.  No intervenable targets for embolization on CT abdomen and pelvis.  Received 2 units PRBCs, hemoglobin has been stable, most recently at 9.2.  Discharge pending SNF placement. ?-IR is on board, appreciate their recommendations ?-Trend CBC ?-Transfuse for Hgb <7 ?-PT and OT recommending SNF ? ?#H/o CKD stage IV with recent progression to ESRD ?It appears his CKD is secondary to his poorly controlled diabetes and hypertension.   Tunneled cath was placed by IR on 5/10, has been receiving HD tx in the hospital. AVF placement by vascular surgery for tomorrow was cancelled due to his retroperitoneal bleed and significant demand ischemia. Currently planned for early June as outpatient. Otherwise, patient has been accepted at [facility] as outpatient for MWF sessions. ?-Nephrology is on board, appreciate their recommendations ?-Vascular surgery is on board, appreciate their recommendations ?-Avoid nephrotoxic medications ?-Trend BMP ? ?#Type 2 NSTEMI ?#New ischemic cardiomyopathy ?Patient presented with elevated troponins, peaked at 1700 and subsequently down trended. Given his acute blood loss anemia, he was thought to have a type II NSTEMI. Cardiology was consulted and agree with this. May consider intervention if Hgb continues to remain stable.  ?-Rosuvastatin 10 mg p.o. daily ?-Zetia 10 mg p.o. daily ?-Low-dose Imdur ?-Avoiding MRA/SGLT2/ARNI in the setting of ESRD ? ?#HTN ?Normotensive on home Coreg and amlodipine. Does not appear hypervolemic on exam. ?-Continue to monitor ? ?#Poorly controlled type 2 diabetes ?Patient most recent A1c 11.9 (01/2021).  At home he takes glargine 22 units nightly and 11 units 3 times daily with meals.   ?-Semglee 20 units daily at bedtime ?-CBG monitoring ?-SSI ? ?H/o CHF, likely HFpEF ?Appears compensated on exam.  Most recent echo was performed on 5/10, showing EF 30-35% with moderately decreased function of left ventricle and G2DD. ?-CTM ? ?Prior to Admission Living Arrangement: Home ?Anticipated Discharge Location: SNF ?Barriers to Discharge: Placement, likely SNF ?Dispo: Anticipated discharge pending placement ? ?Lorin Glass,  Eliseo Squires, MD ?05/17/2021, 6:46 AM ?Pager: [phone]  ?After 5pm on weekdays and 1pm on weekends: On Call pager [phone]  ?

## 2021-05-18 ENCOUNTER — Encounter (HOSPITAL_COMMUNITY)
Admission: EM | Disposition: A | Discharge status: Skilled Nursing Facility | Payer: Self-pay | Source: Home / Self Care | Attending: Internal Medicine

## 2021-05-18 LAB — GLUCOSE, CAPILLARY
Glucose-Capillary: 96 mg/dL (ref 70–99)
Glucose-Capillary: 115 mg/dL — ABNORMAL HIGH (ref 70–99)
Glucose-Capillary: 138 mg/dL — ABNORMAL HIGH (ref 70–99)
Glucose-Capillary: 188 mg/dL — ABNORMAL HIGH (ref 70–99)

## 2021-05-18 LAB — CBC
HCT: 27.9 % — ABNORMAL LOW (ref 39.0–52.0)
Hemoglobin: 8.9 g/dL — ABNORMAL LOW (ref 13.0–17.0)
MCH: 28.3 pg (ref 26.0–34.0)
MCHC: 31.9 g/dL (ref 30.0–36.0)
MCV: 88.9 fL (ref 80.0–100.0)
Platelets: 213 10*3/uL (ref 150–400)
RBC: 3.14 MIL/uL — ABNORMAL LOW (ref 4.22–5.81)
RDW: 15 % (ref 11.5–15.5)
WBC: 8.1 10*3/uL (ref 4.0–10.5)
nRBC: 0 % (ref 0.0–0.2)

## 2021-05-18 LAB — BASIC METABOLIC PANEL
Anion gap: 12 (ref 5–15)
BUN: 38 mg/dL — ABNORMAL HIGH (ref 8–23)
CO2: 21 mmol/L — ABNORMAL LOW (ref 22–32)
Calcium: 7.4 mg/dL — ABNORMAL LOW (ref 8.9–10.3)
Chloride: 97 mmol/L — ABNORMAL LOW (ref 98–111)
Creatinine, Ser: 5.86 mg/dL — ABNORMAL HIGH (ref 0.61–1.24)
GFR, Estimated: 10 mL/min — ABNORMAL LOW (ref >60)
Glucose, Bld: 66 mg/dL — ABNORMAL LOW (ref 70–99)
Potassium: 4.1 mmol/L (ref 3.5–5.1)
Sodium: 130 mmol/L — ABNORMAL LOW (ref 135–145)

## 2021-05-18 SURGERY — ARTERIOVENOUS (AV) FISTULA CREATION
Anesthesia: Choice | Laterality: Left

## 2021-05-18 MED ORDER — DARBEPOETIN ALFA 100 MCG/0.5ML IJ SOSY: 100.0000 ug | PREFILLED_SYRINGE | INTRAMUSCULAR | Status: DC

## 2021-05-18 MED ORDER — HYDROMORPHONE HCL 1 MG/ML IJ SOLN
0.5000 mg | Freq: Once | INTRAMUSCULAR | Status: AC
  Administered 2021-05-18: 0.5 mg via INTRAVENOUS
  Filled 2021-05-18: qty 1

## 2021-05-18 MED ORDER — ASPIRIN 81 MG PO TBEC
81.0000 mg | DELAYED_RELEASE_TABLET | Freq: Every day | ORAL | Status: DC
  Administered 2021-05-18 – 2021-05-20 (×3): 81 mg via ORAL
  Filled 2021-05-18 (×3): qty 1

## 2021-05-18 MED ORDER — INSULIN GLARGINE-YFGN 100 UNIT/ML ~~LOC~~ SOLN
18.0000 [IU] | Freq: Every day | SUBCUTANEOUS | Status: DC
  Administered 2021-05-18 – 2021-05-20 (×3): 18 [IU] via SUBCUTANEOUS
  Filled 2021-05-18 (×3): qty 0.18

## 2021-05-18 NOTE — Progress Notes (Signed)
Physical Therapy Treatment ?Patient Details ?Name: [name] ?MRN: [id number] ?DOB: [birth year] ?Today's Date: 05/18/2021 ? ? ?History of Present Illness Pt is a 61 y.o. M who presents with fatigue, hematuria, and L flank pain. Pt had L renal biopsy 5/5; developed hematuria and abdominal/back pain around the left kidney 5/9. CT scan revealed a large RP hematoma; Hgb 7 from baseline 12. Also developed Type II NSTEMI due to acute severe anemia.  Now on HD with significant medical history of PAD s/p stent and left midfoot amputation, right BKA,  DM, HTN, CKD5 p/w RP bleed following renal biopsy ? ?  ?PT Comments  ? ? Pt admitted with above diagnosis. Pt was able to come to eOB with min assist and incr time for pt to get energy to come to sitting.  Pt takes incr time to don prosthesis as well and did help with donning it per pt request. Pt with poor endurance.   Pt currently with functional limitations due to balance and endurance deficits. Pt will benefit from skilled PT to increase their independence and safety with mobility to allow discharge to the venue listed below.      ?Recommendations for follow up therapy are one component of a multi-disciplinary discharge planning process, led by the attending physician.  Recommendations may be updated based on patient status, additional functional criteria and insurance authorization. ? ?Follow Up Recommendations ? Skilled nursing-short term rehab (<3 hours/day) ?  ?  ?Assistance Recommended at Discharge Frequent or constant Supervision/Assistance  ?Patient can return home with the following A little help with walking and/or transfers;Assistance with cooking/housework;Direct supervision/assist for medications management;Direct supervision/assist for financial management;Assist for transportation;Help with stairs or ramp for entrance ?  ?Equipment Recommendations ? Rolling walker (2 wheels)  ?  ?Recommendations for Other Services   ? ? ?  ?Precautions / Restrictions  Precautions ?Precautions: Fall;Other (comment) ?Precaution Comments: R BKA (has prosthetic in room), hx of L forefoot amputation ?Restrictions ?Weight Bearing Restrictions: No  ?  ? ?Mobility ? Bed Mobility ?Overal bed mobility: Needs Assistance ?Bed Mobility: Supine to Sit ?  ?  ?Supine to sit: Min assist ?  ?  ?General bed mobility comments: Pt took a long time to initiate movement to EOB. Pt would start to move and then relax again.  Incr cues and assist after talking to pt to encourage him to get OOB.  Pt reaching for therapist hand to assist. Heavy use of momentum to come into sitting. Performed x 2 during session due to fatigue with donning prosthesis. ?  ? ?Transfers ?Overall transfer level: Needs assistance ?Equipment used: Rolling walker (2 wheels) ?Transfers: Sit to/from Stand, Bed to chair/wheelchair/BSC ?Sit to Stand: Min assist ?  ?Step pivot transfers: Min guard ?  ?  ?  ?General transfer comment: cues for hand placement. minA to rise and steady with prosthesis and elevated bed height. Able to take steps towards recliner with min guard. Patient deferred further mobility due to fatigue ?  ? ?Ambulation/Gait ?  ?  ?  ?  ?  ?  ?  ?General Gait Details: deferred due to fatigue ? ? ?Stairs ?  ?  ?  ?  ?  ? ? ?Wheelchair Mobility ?  ? ?Modified Rankin (Stroke Patients Only) ?  ? ? ?  ?Balance Overall balance assessment: Needs assistance ?Sitting-balance support: Feet supported ?Sitting balance-Leahy Scale: Good ?Sitting balance - Comments: Donning prosthetic EOB ?  ?Standing balance support: Bilateral upper extremity supported, Reliant on assistive device for balance ?  Standing balance-Leahy Scale: Poor ?Standing balance comment: reliant on UE support ?  ?  ?  ?  ?  ?  ?  ?  ?  ?  ?  ?  ? ?  ?Cognition Arousal/Alertness: Awake/alert ?Behavior During Therapy: Flat affect ?Overall Cognitive Status: Impaired/Different from baseline ?Area of Impairment: Safety/judgement, Awareness ?  ?  ?  ?  ?  ?  ?  ?  ?  ?   ?Memory: Decreased short-term memory ?  ?Safety/Judgement: Decreased awareness of safety, Decreased awareness of deficits ?Awareness: Emergent ?  ?  ?  ?  ? ?  ?Exercises   ? ?  ?General Comments   ?  ?  ? ?Pertinent Vitals/Pain Pain Assessment ?Pain Assessment: Faces ?Faces Pain Scale: Hurts whole lot ?Pain Location: back ?Pain Descriptors / Indicators: Discomfort, Grimacing ?Pain Intervention(s): Limited activity within patient's tolerance, Monitored during session, Repositioned, Patient requesting pain meds-RN notified, RN gave pain meds during session  ? ? ?Home Living   ?  ?  ?  ?  ?  ?  ?  ?  ?  ?   ?  ?Prior Function    ?  ?  ?   ? ?PT Goals (current goals can now be found in the care plan section) Acute Rehab PT Goals ?Patient Stated Goal: to get stronger ?Progress towards PT goals: Progressing toward goals ? ?  ?Frequency ? ? ? Min 3X/week ? ? ? ?  ?PT Plan Current plan remains appropriate  ? ? ?Co-evaluation   ?  ?  ?  ?  ? ?  ?AM-PAC PT "6 Clicks" Mobility   ?Outcome Measure ? Help needed turning from your back to your side while in a flat bed without using bedrails?: A Little ?Help needed moving from lying on your back to sitting on the side of a flat bed without using bedrails?: A Little ?Help needed moving to and from a bed to a chair (including a wheelchair)?: A Little ?Help needed standing up from a chair using your arms (e.g., wheelchair or bedside chair)?: A Little ?Help needed to walk in hospital room?: A Lot ?Help needed climbing 3-5 steps with a railing? : Total ?6 Click Score: 15 ? ?  ?End of Session Equipment Utilized During Treatment: Gait belt ?Activity Tolerance: Patient limited by fatigue;Patient limited by pain ?Patient left: in chair;with call bell/phone within reach;with chair alarm set;with nursing/sitter in room ?Nurse Communication: Mobility status;Patient requests pain meds ?PT Visit Diagnosis: Difficulty in walking, not elsewhere classified (R26.2);Muscle weakness (generalized)  (M62.81) ?  ? ? ?Time: 8592-9244 ?PT Time Calculation (min) (ACUTE ONLY): 24 min ? ?Charges:  $Therapeutic Activity: 23-37 mins          ?          ? ?Darik Massing M,PT ?Acute Rehab Services ?[phone] ?[phone] (pager)  ? ? ?[name] ?05/18/2021, 12:47 PM ? ?

## 2021-05-18 NOTE — Progress Notes (Signed)
Inpatient Diabetes Program Recommendations ? ?AACE/ADA: New Consensus Statement on Inpatient Glycemic Control (2015) ? ?Target Ranges:  Prepandial:   less than 140 mg/dL ?     Peak postprandial:   less than 180 mg/dL (1-2 hours) ?     Critically ill patients:  140 - 180 mg/dL  ? ?Lab Results  ?Component Value Date  ? GLUCAP 96 05/18/2021  ? HGBA1C 11.9 (A) 01/27/2021  ? ? ?Review of Glycemic Control ? Latest Reference Range & Units 05/16/21 21:43 05/16/21 22:07 05/16/21 23:21 05/17/21 11:27 05/17/21 16:31 05/17/21 23:30 05/18/21 08:03  ?Glucose-Capillary 70 - 99 mg/dL 57 (L) 74 136 (H) 105 (H) 88 94 96  ?(L): Data is abnormally low ?(H): Data is abnormally high ? ?Inpatient Diabetes Program Recommendations:   ? ?Please consider decreasing Semglee to 16 units QHS ? ?Will continue to follow while inpatient. ? ?Thank you, ?Reche Dixon, MSN, CDCES ?Diabetes Coordinator ?Inpatient Diabetes Program ?[phone] (team pager from 8a-5p) ? ? ? ?

## 2021-05-18 NOTE — Progress Notes (Signed)
Occupational Therapy Treatment ?Patient Details ?Name: [name] ?MRN: [id number] ?DOB: [birth year] ?Today's Date: 05/18/2021 ? ? ?History of present illness Pt is a 61 y.o. M who presents with fatigue, hematuria, and L flank pain. Pt had L renal biopsy 5/5; developed hematuria and abdominal/back pain around the left kidney 5/9. CT scan revealed a large RP hematoma; Hgb 7 from baseline 12. Also developed Type II NSTEMI due to acute severe anemia.  Now on HD with significant medical history of PAD s/p stent and left midfoot amputation, right BKA,  DM, HTN, CKD5 p/w RP bleed following renal biopsy ?  ?OT comments ? Pt with poor activity tolerance. Requiring extended time for all mobility and intermittent rest periods. Pt attempting to avoid use of RW, but much safer with B UE support. Pt tolerated walking to bathroom, toileting on BSC over toilet, seated grooming and removed his R prosthesis at EOB prior to return to supine.   ? ?Recommendations for follow up therapy are one component of a multi-disciplinary discharge planning process, led by the attending physician.  Recommendations may be updated based on patient status, additional functional criteria and insurance authorization. ?   ?Follow Up Recommendations ? Skilled nursing-short term rehab (<3 hours/day)  ?  ?Assistance Recommended at Discharge Frequent or constant Supervision/Assistance  ?Patient can return home with the following ? A little help with walking and/or transfers;A little help with bathing/dressing/bathroom;Assistance with cooking/housework;Assist for transportation;Help with stairs or ramp for entrance ?  ?Equipment Recommendations ? None recommended by OT  ?  ?Recommendations for Other Services   ? ?  ?Precautions / Restrictions Precautions ?Precautions: Fall;Other (comment) ?Precaution Comments: R BKA (has prosthetic in room), hx of L forefoot amputation ?Restrictions ?Weight Bearing Restrictions: No  ? ? ?  ? ?Mobility Bed Mobility ?Overal bed  mobility: Needs Assistance ?Bed Mobility: Supine to Sit ?  ?  ?  ?Sit to supine: Supervision ?  ?General bed mobility comments: returned to bed ?  ? ?Transfers ?Overall transfer level: Needs assistance ?Equipment used: Rolling walker (2 wheels) ?Transfers: Sit to/from Stand ?Sit to Stand: Min guard ?  ?  ?  ?  ?  ?General transfer comment: min guard from recliner and BSC over toilet, pt attempting to stand and ambulate without walker, reached to steady and then released walker again when walking into bathroom ?  ?  ?Balance Overall balance assessment: Needs assistance ?Sitting-balance support: Feet supported ?Sitting balance-Leahy Scale: Good ?  ?  ?Standing balance support: Bilateral upper extremity supported, Reliant on assistive device for balance ?Standing balance-Leahy Scale: Poor ?Standing balance comment: reliant on UE support ?  ?  ?  ?  ?  ?  ?  ?  ?  ?  ?  ?   ? ?ADL either performed or assessed with clinical judgement  ? ?ADL Overall ADL's : Needs assistance/impaired ?  ?  ?Grooming: Wash/dry hands;Sitting;Set up ?  ?  ?  ?  ?  ?  ?  ?Lower Body Dressing: Supervision/safety;Sitting/lateral leans ?Lower Body Dressing Details (indicate cue type and reason): doffed R prosthesis ?Toilet Transfer: Ambulation;BSC/3in1;Min guard;Rolling walker (2 wheels) ?Toilet Transfer Details (indicate cue type and reason): BSC over toilet ?Toileting- Water quality scientist and Hygiene: Min guard;Sit to/from stand ?  ?  ?  ?Functional mobility during ADLs: Min guard;Rolling walker (2 wheels) ?General ADL Comments: Pt requiring extensive rest periods between after mobilizing. ?  ? ?Extremity/Trunk Assessment   ?  ?  ?  ?  ?  ? ?Vision   ?  ?  ?  Perception   ?  ?Praxis   ?  ? ?Cognition Arousal/Alertness: Awake/alert ?Behavior During Therapy: Flat affect ?Overall Cognitive Status: Impaired/Different from baseline ?Area of Impairment: Safety/judgement, Memory ?  ?  ?  ?  ?  ?  ?  ?  ?  ?  ?Memory: Decreased short-term memory ?   ?Safety/Judgement: Decreased awareness of safety, Decreased awareness of deficits ?  ?  ?General Comments: pt weak, admits it, but still wants to avoid using walker ?  ?  ?   ?Exercises   ? ?  ?Shoulder Instructions   ? ? ?  ?General Comments    ? ? ?Pertinent Vitals/ Pain       Pain Assessment ?Pain Assessment: Faces ?Faces Pain Scale: Hurts a little bit ?Pain Location: back, abdomen ?Pain Descriptors / Indicators: Discomfort ?Pain Intervention(s): Premedicated before session ? ?Home Living   ?  ?  ?  ?  ?  ?  ?  ?  ?  ?  ?  ?  ?  ?  ?  ?  ?  ?  ? ?  ?Prior Functioning/Environment    ?  ?  ?  ?   ? ?Frequency ? Min 2X/week  ? ? ? ? ?  ?Progress Toward Goals ? ?OT Goals(current goals can now be found in the care plan section) ? Progress towards OT goals: Progressing toward goals ? ?Acute Rehab OT Goals ?OT Goal Formulation: With patient ?Time For Goal Achievement: 05/28/21 ?Potential to Achieve Goals: Good  ?Plan Discharge plan remains appropriate   ? ?Co-evaluation ? ? ?   ?  ?  ?  ?  ? ?  ?AM-PAC OT "6 Clicks" Daily Activity     ?Outcome Measure ? ? Help from another person eating meals?: None ?Help from another person taking care of personal grooming?: A Little ?Help from another person toileting, which includes using toliet, bedpan, or urinal?: A Little ?Help from another person bathing (including washing, rinsing, drying)?: A Little ?Help from another person to put on and taking off regular upper body clothing?: None ?Help from another person to put on and taking off regular lower body clothing?: A Little ?6 Click Score: 20 ? ?  ?End of Session Equipment Utilized During Treatment: Rolling walker (2 wheels) ? ?OT Visit Diagnosis: Unsteadiness on feet (R26.81);Muscle weakness (generalized) (M62.81) ?  ?Activity Tolerance Patient limited by fatigue ?  ?Patient Left in bed;with call bell/phone within reach;with bed alarm set ?  ?Nurse Communication Other (comment) (ok to have Nepro shake) ?  ? ?   ? ?Time:  1420-1510 ?OT Time Calculation (min): 50 min ? ?Charges: OT General Charges ?$OT Visit: 1 Visit ?OT Treatments ?$Self Care/Home Management : 38-52 mins ? ?Nestor Lewandowsky, OTR/L ?Acute Rehabilitation Services ?Pager: [phone] ?Office: [phone]  ? ?[name] ?05/18/2021, 3:22 PM ?

## 2021-05-18 NOTE — Progress Notes (Signed)
? ?Subjective:  ?Patient was seen this AM.  He continues to endorse left-sided back pain and LLQ pain which has not responded to Tylenol.  His pain is overall improved since he was first admitted.  No other complaints or concerns today. ? ?Objective: ? ?Vital signs in last 24 hours: ?Vitals:  ? 05/17/21 1750 05/17/21 2034 05/17/21 2035 05/18/21 0414  ?BP: 113/64  124/69 135/73  ?Pulse: 65  65 74  ?Resp: '17  18 18  '$ ?Temp: 98 ?F (36.7 ?C) 97.6 ?F (36.4 ?C)  98.2 ?F (36.8 ?C)  ?TempSrc: Oral Oral  Oral  ?SpO2: 99%  100% 99%  ?Weight:      ?Height:      ? ? ?Constitutional: Chronically ill appearing, lying in bed and in no distress ?HENT:  ?Head: Normocephalic and atraumatic.  ?Eyes: EOM are normal.  ?Neck: Normal range of motion.  ?Cardiovascular: Normal rate, regular rhythm, intact distal pulses. No gallop and no friction rub.  ?No murmur heard. No lower extremity edema  ?Pulmonary: Non labored breathing on room air, no wheezing or rales  ?Abdominal: Soft. Normal bowel sounds. Non distended, mild TTP to the LLQ ?Musculoskeletal: Normal range of motion. S/p R BKA, s/p left transmetatarsal amputation  ?   General: No tenderness or edema.  ?Neurological: Alert and oriented to person, place, and time. Non focal  ?Skin: Skin is warm and dry.  ? ?Assessment/Plan: ? ?Principal Problem: ?  Acute blood loss anemia ?Active Problems: ?  ESRD on hemodialysis ([place]) ? ?#Large retroperitoneal hematoma  ?#Symptomatic anemia, improving ?Patient developed left lower abdominal pain, flank pain, hematuria, and dizziness after a renal biopsy 5/5 with imaging revealing a large retroperitoneal hematoma.  No intervenable targets for embolization on CT abdomen and pelvis.  Received total of 2 units PRBCs, hemoglobin has been stable overall, most recently at 8.9.  Discharge pending SNF placement. ?-IR is on board, appreciate their recommendations ?-Trend CBC ?-Transfuse for Hgb <7 ?-PT and OT recommending SNF ? ?#H/o CKD stage IV with recent  progression to ESRD ?It appears his CKD is secondary to his poorly controlled diabetes and hypertension.  Tunneled cath was placed by IR on 5/10, has been receiving HD tx in the hospital. AVF placement by vascular surgery has been postponed to early June due to his recent type II NSTEMI. Otherwise, patient has been accepted at [facility] as outpatient for MWF sessions. ?-Nephrology is on board, appreciate their recommendations ?-Vascular surgery is on board, appreciate their recommendations ?-HD per nephrology ?-Avoid nephrotoxic medications ?-Trend BMP ? ?#Type 2 NSTEMI ?#New ischemic cardiomyopathy ?Patient presented with elevated troponins, peaked at 1700 and subsequently down trended. Given his acute blood loss anemia, he was thought to have a type II NSTEMI. Cardiology was consulted and agree with this.  Hemoglobin has been stable the past few days, therefore we will start daily aspirin today. ?-Start ASA 81 mg today ?-Rosuvastatin 10 mg p.o. daily ?-Zetia 10 mg p.o. daily ?-Low-dose Imdur ?-Avoiding MRA/SGLT2/ARNI in the setting of ESRD ? ?#HTN ?Normotensive on home Coreg and amlodipine. Does not appear hypervolemic on exam. ?-Continue to monitor ? ?#Poorly controlled type 2 diabetes ?Patient most recent A1c 11.9 (01/2021).  At home he takes glargine 22 units nightly and 11 units 3 times daily with meals.   ?-Decreased Semglee to 18 units daily at bedtime ?-CBG monitoring ?-SSI ? ?H/o CHF, likely HFpEF ?Appears compensated on exam.  Most recent echo was performed on 5/10, showing EF 30-35% with moderately decreased function of  left ventricle and G2DD. ?-CTM ? ?Prior to Admission Living Arrangement: Home ?Anticipated Discharge Location: SNF ?Barriers to Discharge: Placement, likely SNF ?Dispo: Anticipated discharge pending placement ? ?Orvis Brill, MD ?05/18/2021, 7:18 AM ?Pager: [phone]  ?After 5pm on weekdays and 1pm on weekends: On Call pager [phone]  ?

## 2021-05-18 NOTE — Progress Notes (Signed)
[facility] ?NEPHROLOGY PROGRESS NOTE ? ?Assessment/ Plan: ? 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 S/p tunn catheter on 5/10 with first tx on 5/10. ?He is receiving dialysis as per MWF schedule.  Vein mapping was done.  The vascular surgeon will place AV fistula as outpatient. Upon discharge he will be MWF schedule at [facility] for HD. ?Plan for HD today. ? ?# Anemia Acute blood loss and CKD:  Retroperitoneal hematoma status post kidney biopsy. Iron is repleted and received blood products.  Continue Aranesp.  Monitor hemoglobin. ? ?# Retroperitoneal hematoma: Seen by IR, clinically stable. ? ?#Hyperkalemia: Improved with dialysis. ? ?#Metabolic acidosis: Improved with HD.  Monitor. ? ?#Hypertension: Blood pressure acceptable, volume optimized with dialysis. ? ?# NSTEMI: Per primary team, AV fistula surgery was postponed to outpatient. ? ?#CKD-MBD/secondary hyperparathyroidism: PTH 190.  Mild hyperphosphatemia managing with dialysis.   ? ?Subjective: Seen and examined.  No new event.  Denies nausea vomiting chest pain or shortness of breath. ?Objective ?Vital signs in last 24 hours: ?Vitals:  ? 05/17/21 2034 05/17/21 2035 05/18/21 0414 05/18/21 0947  ?BP:  124/69 135/73 135/73  ?Pulse:  65 74 66  ?Resp:  '18 18 16  '$ ?Temp: 97.6 ?F (36.4 ?C)  98.2 ?F (36.8 ?C) 98 ?F (36.7 ?C)  ?TempSrc: Oral  Oral   ?SpO2:  100% 99% 97%  ?Weight:      ?Height:      ? ?Weight change:  ? ?Intake/Output Summary (Last 24 hours) at 05/18/2021 1110 ?Last data filed at 05/18/2021 1009 ?Gross per 24 hour  ?Intake 1140 ml  ?Output 1300 ml  ?Net -160 ml  ? ? ? ? ? ?Labs: ?Basic Metabolic Panel: ?Recent Labs  ?Lab 05/13/21 ?0312 05/14/21 ?1610 05/16/21 ?0326 05/17/21 ?0310 05/18/21 ?0206  ?NA 132*   < > 133* 130* 130*  ?K 5.0   < > 3.5 3.6 4.1  ?CL 101   < > 98 97* 97*  ?CO2 19*   < > 24 22 21*  ?GLUCOSE 250*   < > 96 125* 66*  ?BUN 69*   < > 30* 35* 38*   ?CREATININE 7.43*   < > 5.07* 5.87* 5.86*  ?CALCIUM 7.8*   < > 7.6* 7.5* 7.4*  ?PHOS 4.9*  --   --  5.2*  --   ? < > = values in this interval not displayed.  ? ?Liver Function Tests: ?Recent Labs  ?Lab 05/12/21 ?1302 05/13/21 ?0312 05/14/21 ?0839  ?AST 107* 100* 39  ?ALT 109* 119* 71*  ?ALKPHOS 98 116 94  ?BILITOT 1.0 1.7* 1.3*  ?PROT 7.3 6.7 6.4*  ?ALBUMIN 3.0* 2.8* 2.5*  ? ?No results for input(s): LIPASE, AMYLASE in the last 168 hours. ?No results for input(s): AMMONIA in the last 168 hours. ?CBC: ?Recent Labs  ?Lab 05/12/21 ?1124 05/12/21 ?2010 05/15/21 ?0225 05/15/21 ?1840 05/16/21 ?0326 05/17/21 ?0310 05/18/21 ?0206  ?WBC 13.4*   < > 9.8 8.6 8.1 9.0 8.1  ?NEUTROABS 11.4*  --   --   --   --   --   --   ?HGB 6.9*   < > 8.1* 8.7* 8.3* 9.2* 8.9*  ?HCT 20.9*   < > 25.4* 26.5* 25.5* 27.8* 27.9*  ?MCV 90.1   < > 87.9 87.2 88.2 87.4 88.9  ?PLT 257   < > 222 220 205 227 213  ? < > = values in this interval not displayed.  ? ?Cardiac  Enzymes: ?Recent Labs  ?Lab 05/12/21 ?2542  ?CKTOTAL 271  ? ?CBG: ?Recent Labs  ?Lab 05/16/21 ?2321 05/17/21 ?1127 05/17/21 ?1631 05/17/21 ?2330 05/18/21 ?0803  ?GLUCAP 136* 105* 88 94 96  ? ? ?Iron Studies: No results for input(s): IRON, TIBC, TRANSFERRIN, FERRITIN in the last 72 hours. ?Studies/Results: ?VAS [place] UPPER EXT VEIN MAPPING (PRE-OP AVF) ? ?Result Date: 05/16/2021 ?[place] MAPPING Patient Name:  [name]  Date of Exam:   05/16/2021 Medical Rec #: [id number]    Accession #:    [id number] Date of Birth: [birth year]    Patient Gender: M Patient Age:   61 years Exam Location:  [facility] Procedure:      VAS [place] UPPER EXT VEIN MAPPING (PRE-OP AVF) Referring Phys: Harrie Jeans --------------------------------------------------------------------------------  Indications: Pre-access. Performing Technologist: Rogelia Rohrer RVT, RDMS  Examination Guidelines: A complete evaluation includes B-mode imaging, spectral Doppler, color Doppler, and power Doppler as needed of all  accessible portions of each vessel. Bilateral testing is considered an integral part of a complete examination. Limited examinations for reoccurring indications may be performed as noted. +-----------------+-------------+----------+---------+ Right Cephalic   Diameter (cm)Depth (cm)Findings  +-----------------+-------------+----------+---------+ Shoulder             0.25        0.57             +-----------------+-------------+----------+---------+ Prox upper arm       0.29        0.45             +-----------------+-------------+----------+---------+ Mid upper arm        0.28        0.32             +-----------------+-------------+----------+---------+ Dist upper arm       0.20        0.33   branching +-----------------+-------------+----------+---------+ Antecubital fossa    0.24        0.30   branching +-----------------+-------------+----------+---------+ Prox forearm         0.34        0.64   branching +-----------------+-------------+----------+---------+ Mid forearm          0.20        0.22             +-----------------+-------------+----------+---------+ Dist forearm         0.22        0.24             +-----------------+-------------+----------+---------+ Wrist                0.19        0.28             +-----------------+-------------+----------+---------+ +-----------------+-------------+----------+---------+ Right Basilic    Diameter (cm)Depth (cm)Findings  +-----------------+-------------+----------+---------+ Prox upper arm       0.36                         +-----------------+-------------+----------+---------+ Mid upper arm        0.31                         +-----------------+-------------+----------+---------+ Dist upper arm       0.25               branching +-----------------+-------------+----------+---------+ Antecubital fossa    0.23                         +-----------------+-------------+----------+---------+  Prox forearm         0.21                         +-----------------+-------------+----------+---------+ Mid forearm          0.14                         +-----------------+-------------+----------+---------+ Distal forearm       0.20                         +-----------------+-------------+----------+---------+ Wrist                0.15                         +-----------------+-------------+----------+---------+ +-----------------+-------------+----------+-------------------------------+ Left Cephalic    Diameter (cm)Depth (cm)           Findings             +-----------------+-------------+----------+-------------------------------+ Shoulder             0.34        0.38                                   +-----------------+-------------+----------+-------------------------------+ Prox upper arm       0.25        0.32                                   +-----------------+-------------+----------+-------------------------------+ Mid upper arm        0.25        0.29                                   +-----------------+-------------+----------+-------------------------------+ Dist upper arm       0.31        0.33                                   +-----------------+-------------+----------+-------------------------------+ Antecubital fossa    0.28        0.19                                   +-----------------+-------------+----------+-------------------------------+ Prox forearm                            not visualized and IV placement +-----------------+-------------+----------+-------------------------------+ Mid forearm          0.16        0.31              branching            +-----------------+-------------+----------+-------------------------------+ Dist forearm         0.16        0.21                                   +-----------------+-------------+----------+-------------------------------+ Wrist                0.15  0.23                                    +-----------------+-------------+----------+-------------------------------+ +-----------------+-------------+----------+---------+ Left Basilic     Diameter (cm)Depth (cm)Findings  +-----------------+-------------+----------+---------+ Prox upper arm       0.42                         +-----------------+-------------+------

## 2021-05-18 NOTE — TOC Progression Note (Signed)
Transition of Care (TOC) - Initial/Assessment Note  ? ? ?Patient Details  ?Name: [name] ?MRN: [id number] ?Date of Birth: August 27, 1960 ? ?Transition of Care (TOC) CM/SW Contact:    ?Paulene Floor Mert Dietrick, LCSWA ?Phone Number: ?05/18/2021, 12:10 PM ? ?Clinical Narrative:                 ?Patient does not have any bed offers.  TOC will continue to follow. ? ? ?Expected Discharge Plan: [place] ?Barriers to Discharge: Ship broker, SNF Pending bed offer, Continued Medical Work up ? ? ?Patient Goals and CMS Choice ?Patient states their goals for this hospitalization and ongoing recovery are:: To get around (mobilize) like he "used to". ?CMS Medicare.gov Compare Post Acute Care list provided to:: Patient ?Choice offered to / list presented to : Patient ? ?Expected Discharge Plan and Services ?Expected Discharge Plan: [place] ?  ?  ?  ?Living arrangements for the past 2 months: Apartment ?                ?  ?  ?  ?  ?  ?  ?  ?  ?  ?  ? ?Prior Living Arrangements/Services ?Living arrangements for the past 2 months: Apartment ?Lives with:: Self ?Patient language and need for interpreter reviewed:: Yes ?Do you feel safe going back to the place where you live?: Yes      ?Need for Family Participation in Patient Care: Yes (Comment) ?Care giver support system in place?: Yes (comment) ?  ?Criminal Activity/Legal Involvement Pertinent to Current Situation/Hospitalization: No - Comment as needed ? ?Activities of Daily Living ?Home Assistive Devices/Equipment: None, Prosthesis ?ADL Screening (condition at time of admission) ?Patient's cognitive ability adequate to safely complete daily activities?: Yes ?Is the patient deaf or have difficulty hearing?: No ?Does the patient have difficulty seeing, even when wearing glasses/contacts?: No ?Does the patient have difficulty concentrating, remembering, or making decisions?: No ?Patient able to express need for assistance with ADLs?: Yes ?Does the  patient have difficulty dressing or bathing?: No ?Independently performs ADLs?: Yes (appropriate for developmental age) ?Does the patient have difficulty walking or climbing stairs?: No ?Weakness of Legs: None ?Weakness of Arms/Hands: None ? ?Permission Sought/Granted ?Permission sought to share information with : Other (comment) (CSW) ?Permission granted to share information with : Yes, Verbal Permission Granted ? Share Information with NAME: Delores ? Permission granted to share info w AGENCY: SNF ?   ?   ? ?Emotional Assessment ?Appearance:: Appears stated age ?Attitude/Demeanor/Rapport: Gracious, Engaged ?Affect (typically observed): Accepting, Hopeful, Pleasant ?Orientation: : Oriented to Self, Oriented to Place, Oriented to Situation ?Alcohol / Substance Use: Not Applicable ?Psych Involvement: No (comment) ? ?Admission diagnosis:  Acute blood loss anemia [D62] ?Retroperitoneal hematoma [K66.1] ?Acute cystitis with hematuria [N30.01] ?Acute renal failure superimposed on stage 4 chronic kidney disease, unspecified acute renal failure type ([place]) [N17.9, N18.4] ?Patient Active Problem List  ? Diagnosis Date Noted  ? Retroperitoneal hematoma   ? ESRD on hemodialysis ([place])   ? Acute blood loss anemia 05/12/2021  ? Colon polyps 03/03/2021  ? Mild cognitive impairment with memory loss 02/03/2021  ? Difficulty urinating 02/03/2021  ? Hx of medication noncompliance 09/30/2020  ? Rash 09/22/2020  ? S/P transmetatarsal amputation of foot, left ([place]) 11/23/2019  ? Wound of right leg 11/09/2019  ? 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 S/P BKA (below knee amputation) unilateral, right ([place]) 08/17/2018  ? Pulmonary nodules/lesions, multiple 02/16/2018  ?  Diabetic peripheral neuropathy ([place]) 05/30/2014  ? GERD (gastroesophageal reflux disease) 05/25/2013  ? Diastolic dysfunction 70/78/6754  ? Healthcare maintenance 02/15/2013  ? Chronic cough 06/01/2012  ? PVD (peripheral vascular disease) ([place])  12/31/2011  ? Type 2 diabetes mellitus ([place]) 03/06/2008  ? HLD (hyperlipidemia) 03/06/2008  ? Essential hypertension 03/06/2008  ? ?PCP:  [name], MD ?Pharmacy:   ?[facility] ?[place] [address] ?Phone: [phone] Fax: [phone] ? ?[name] ?[address] ?[place] [address] ?Phone: [phone] Fax: [phone] ? ?[facility] ?[address] ?[place] [address] ?Phone: [phone] Fax: [phone] ? ?[facility] [facility] ?[place] [address] ?Phone: [phone] Fax: [phone] ? ? ? ? ?Social Determinants of Health (SDOH) Interventions ?  ? ?Readmission Risk Interventions ?   ? View : No data to display.  ?  ?  ?  ? ? ? ?

## 2021-05-19 DIAGNOSIS — I120 Hypertensive chronic kidney disease with stage 5 chronic kidney disease or end stage renal disease: Secondary | ICD-10-CM

## 2021-05-19 LAB — BASIC METABOLIC PANEL
Anion gap: 7 (ref 5–15)
BUN: 18 mg/dL (ref 8–23)
CO2: 29 mmol/L (ref 22–32)
Calcium: 7.8 mg/dL — ABNORMAL LOW (ref 8.9–10.3)
Chloride: 101 mmol/L (ref 98–111)
Creatinine, Ser: 3.61 mg/dL — ABNORMAL HIGH (ref 0.61–1.24)
GFR, Estimated: 18 mL/min — ABNORMAL LOW (ref >60)
Glucose, Bld: 120 mg/dL — ABNORMAL HIGH (ref 70–99)
Potassium: 3.7 mmol/L (ref 3.5–5.1)
Sodium: 137 mmol/L (ref 135–145)

## 2021-05-19 LAB — CBC
HCT: 30.3 % — ABNORMAL LOW (ref 39.0–52.0)
Hemoglobin: 9.8 g/dL — ABNORMAL LOW (ref 13.0–17.0)
MCH: 28.2 pg (ref 26.0–34.0)
MCHC: 32.3 g/dL (ref 30.0–36.0)
MCV: 87.1 fL (ref 80.0–100.0)
Platelets: 230 10*3/uL (ref 150–400)
RBC: 3.48 MIL/uL — ABNORMAL LOW (ref 4.22–5.81)
RDW: 14.6 % (ref 11.5–15.5)
WBC: 7.2 10*3/uL (ref 4.0–10.5)
nRBC: 0 % (ref 0.0–0.2)

## 2021-05-19 LAB — GLUCOSE, CAPILLARY
Glucose-Capillary: 100 mg/dL — ABNORMAL HIGH (ref 70–99)
Glucose-Capillary: 123 mg/dL — ABNORMAL HIGH (ref 70–99)
Glucose-Capillary: 96 mg/dL (ref 70–99)
Glucose-Capillary: 117 mg/dL — ABNORMAL HIGH (ref 70–99)

## 2021-05-19 MED ORDER — CHLORHEXIDINE GLUCONATE CLOTH 2 % EX PADS: 6.0000 | MEDICATED_PAD | Freq: Every day | CUTANEOUS | Status: DC

## 2021-05-19 NOTE — Progress Notes (Signed)
New Dialysis Start  ? ?Patient identified as new dialysis start. Kidney Education packet assembled and given. Discussed the following items with patient:   ? ?Current medications and possible changes once started:  Discussed that patient's medications may change over time.  Ex; hypertension medications and diabetes medication.  Nephrologists will adjust as needed. ? ?Fluid restrictions reviewed:  Patient will be going [employer].  I discussed the recommended daily fluid intake.    ? ?Phosphorus and potassium: Handout given showing high potassium and phosphorus foods.  Alternative food and drink options given.   ? ?Family support:  no family present in room.  Patient reported his sister will be coming later and she would read over handouts and information about dialysis.  I will follow up as needed.   ? ?Outpatient Clinic Resources:  Discussed roles of Outpatient clinic  staff and advised to make a list of needs, if any, to talk with outpatient staff if needed ? ?Care plan schedule: Informed patient and family member of Care Plans in outpatient setting and to participate in the care plan.  An invitation would be given from outpatient clinic.  ? ?Dialysis Access Options:  Reviewed access options with patient. Discussed in detail about care at home with new AVG & AVF. Reviewed checking bruit and thrill. Patient will be discharged with a catheter,  educated that patient could not take showers.  Catheter dressing changes were to be done by outpatient clinic staff only ? ?Home therapy options:  Educated patient about home therapy options:  PD vs home hemo.  Patient stated she is not interested at this time in home therapies. Patient is currently going to [place] and I notified him in the future when he returns home he can let the outpatient staff know if he is interested in home therapies. ?  ?Patient verbalized understanding. Will continue to round on patient during admission.    ? ?Lilia Argue, RN   ?

## 2021-05-19 NOTE — TOC Progression Note (Addendum)
Transition of Care (TOC) - Initial/Assessment Note  ? ? ?Patient Details  ?Name: [name] ?MRN: [id number] ?Date of Birth: [birth year] ? ?Transition of Care (TOC) CM/SW Contact:    ?Paulene Floor Lowery Paullin, LCSWA ?Phone Number: ?05/19/2021, 10:39 AM ? ?Clinical Narrative:                 ?CSW met with the patient at bedside and presented bed offers.  The patient agreed to [facility].  CSW contacted the facility to verify that they still have a bed available and is awaiting a response.   ? ?10:49-  [name] accept.  Insurance auth pending.  ? ?Expected Discharge Plan: [place] ?Barriers to Discharge: Ship broker, SNF Pending bed offer, Continued Medical Work up ? ? ?Patient Goals and CMS Choice ?Patient states their goals for this hospitalization and ongoing recovery are:: To get around (mobilize) like he "used to". ?CMS Medicare.gov Compare Post Acute Care list provided to:: Patient ?Choice offered to / list presented to : Patient ? ?Expected Discharge Plan and Services ?Expected Discharge Plan: [place] ?  ?  ?  ?Living arrangements for the past 2 months: Apartment ?                ?  ?  ?  ?  ?  ?  ?  ?  ?  ?  ? ?Prior Living Arrangements/Services ?Living arrangements for the past 2 months: Apartment ?Lives with:: Self ?Patient language and need for interpreter reviewed:: Yes ?Do you feel safe going back to the place where you live?: Yes      ?Need for Family Participation in Patient Care: Yes (Comment) ?Care giver support system in place?: Yes (comment) ?  ?Criminal Activity/Legal Involvement Pertinent to Current Situation/Hospitalization: No - Comment as needed ? ?Activities of Daily Living ?Home Assistive Devices/Equipment: None, Prosthesis ?ADL Screening (condition at time of admission) ?Patient's cognitive ability adequate to safely complete daily activities?: Yes ?Is the patient deaf or have difficulty hearing?: No ?Does the patient have difficulty seeing, even when  wearing glasses/contacts?: No ?Does the patient have difficulty concentrating, remembering, or making decisions?: No ?Patient able to express need for assistance with ADLs?: Yes ?Does the patient have difficulty dressing or bathing?: No ?Independently performs ADLs?: Yes (appropriate for developmental age) ?Does the patient have difficulty walking or climbing stairs?: No ?Weakness of Legs: None ?Weakness of Arms/Hands: None ? ?Permission Sought/Granted ?Permission sought to share information with : Other (comment) (CSW) ?Permission granted to share information with : Yes, Verbal Permission Granted ? Share Information with NAME: Delores ? Permission granted to share info w AGENCY: SNF ?   ?   ? ?Emotional Assessment ?Appearance:: Appears stated age ?Attitude/Demeanor/Rapport: Gracious, Engaged ?Affect (typically observed): Accepting, Hopeful, Pleasant ?Orientation: : Oriented to Self, Oriented to Place, Oriented to Situation ?Alcohol / Substance Use: Not Applicable ?Psych Involvement: No (comment) ? ?Admission diagnosis:  Acute blood loss anemia [D62] ?Retroperitoneal hematoma [K66.1] ?Acute cystitis with hematuria [N30.01] ?Acute renal failure superimposed on stage 4 chronic kidney disease, unspecified acute renal failure type ([place]) [N17.9, N18.4] ?Patient Active Problem List  ? Diagnosis Date Noted  ? Retroperitoneal hematoma   ? ESRD on hemodialysis ([place])   ? Acute blood loss anemia 05/12/2021  ? Colon polyps 03/03/2021  ? Mild cognitive impairment with memory loss 02/03/2021  ? Difficulty urinating 02/03/2021  ? Hx of medication noncompliance 09/30/2020  ? Rash 09/22/2020  ? S/P transmetatarsal amputation of foot, left ([place]) 11/23/2019  ? Wound  of right leg 11/09/2019  ? CKD stage 4 due to type 2 diabetes mellitus ([place]) 10/27/2018  ? S/P BKA (below knee amputation) unilateral, right ([place]) 08/17/2018  ? Pulmonary nodules/lesions, multiple 02/16/2018  ? Diabetic peripheral neuropathy ([place]) 05/30/2014  ? GERD  (gastroesophageal reflux disease) 05/25/2013  ? Diastolic dysfunction 25/95/6387  ? Healthcare maintenance 02/15/2013  ? Chronic cough 06/01/2012  ? PVD (peripheral vascular disease) ([place]) 12/31/2011  ? Type 2 diabetes mellitus ([place]) 03/06/2008  ? HLD (hyperlipidemia) 03/06/2008  ? Essential hypertension 03/06/2008  ? ?PCP:  [name], MD ?Pharmacy:   ?[facility] ?[address] ?[place] [address] ?Phone: [phone] Fax: [phone] ? ?[name] ?[address] ?[place] [address] ?Phone: [phone] Fax: [phone] ? ?[facility] ?[address] ?[place] [address] ?Phone: [phone] Fax: [phone] ? ?[facility] [address] ?[place] [address] ?Phone: [phone] Fax: [phone] ? ? ? ? ?Social Determinants of Health (SDOH) Interventions ?  ? ?Readmission Risk Interventions ?   ? View : No data to display.  ?  ?  ?  ? ? ? ?

## 2021-05-19 NOTE — Progress Notes (Signed)
Mobility Specialist: Progress Note ? ? 05/19/21 1015  ?Mobility  ?Activity Refused mobility  ? ?Pt refused mobility stating he is waiting on his sister to bring him some pants first. Offered solutions but pt still wishing to wait until his sister arrives with his pants. Will f/u as able.  ? ?Harrell Gave Shey Bartmess ?Mobility Specialist ?Mobility Specialist [place]: [phone] ?Mobility Specialist [place]: [phone] ? ?

## 2021-05-19 NOTE — Progress Notes (Signed)
[facility] ?NEPHROLOGY PROGRESS NOTE ? ?Assessment/ Plan: ? 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 S/p tunn catheter on 5/10 with first tx on 5/10. ?He is receiving dialysis as per MWF schedule.  Vein mapping was done.  The vascular surgeon will place AV fistula as outpatient. Upon discharge he will be MWF schedule at [facility] for HD. ?Status post dialysis yesterday with around 1 L UF, tolerated well.  Regular next HD tomorrow. ? ?# Anemia Acute blood loss and CKD:  Retroperitoneal hematoma status post kidney biopsy. Iron is repleted and received blood products.  Continue Aranesp.  Monitor hemoglobin. ? ?# Retroperitoneal hematoma: Seen by IR, clinically stable. ? ?#Hyperkalemia: Improved with dialysis. ? ?#Metabolic acidosis: Improved with HD.  Monitor. ? ?#Hypertension: Blood pressure acceptable, volume optimized with dialysis. ? ?# NSTEMI: Per primary team, AV fistula surgery was postponed to outpatient. ? ?#CKD-MBD/secondary hyperparathyroidism: PTH 190.  Mild hyperphosphatemia managing with dialysis.   ? ?Subjective: Seen and examined.  Tolerating dialysis well.  No new event.  He denies nausea, vomiting, chest pain or shortness of breath.  Feels tired. ? ?Objective ?Vital signs in last 24 hours: ?Vitals:  ? 05/19/21 0200 05/19/21 0215 05/19/21 0220 05/19/21 0506  ?BP: (!) 150/107 (!) 148/75 (!) 148/72 (!) 151/74  ?Pulse: 70 70 72 76  ?Resp:   18 18  ?Temp:   98.4 ?F (36.9 ?C) 98 ?F (36.7 ?C)  ?TempSrc:   Oral   ?SpO2:   99% 100%  ?Weight:   72.3 kg   ?Height:      ? ?Weight change:  ? ?Intake/Output Summary (Last 24 hours) at 05/19/2021 0909 ?Last data filed at 05/19/2021 0506 ?Gross per 24 hour  ?Intake 540 ml  ?Output 1200 ml  ?Net -660 ml  ? ? ? ? ? ? ?Labs: ?Basic Metabolic Panel: ?Recent Labs  ?Lab 05/13/21 ?0312 05/14/21 ?5462 05/17/21 ?0310 05/18/21 ?0206 05/19/21 ?7035  ?NA 132*   < > 130* 130* 137  ?K 5.0   < > 3.6  4.1 3.7  ?CL 101   < > 97* 97* 101  ?CO2 19*   < > 22 21* 29  ?GLUCOSE 250*   < > 125* 66* 120*  ?BUN 69*   < > 35* 38* 18  ?CREATININE 7.43*   < > 5.87* 5.86* 3.61*  ?CALCIUM 7.8*   < > 7.5* 7.4* 7.8*  ?PHOS 4.9*  --  5.2*  --   --   ? < > = values in this interval not displayed.  ? ? ?Liver Function Tests: ?Recent Labs  ?Lab 05/12/21 ?1302 05/13/21 ?0312 05/14/21 ?0839  ?AST 107* 100* 39  ?ALT 109* 119* 71*  ?ALKPHOS 98 116 94  ?BILITOT 1.0 1.7* 1.3*  ?PROT 7.3 6.7 6.4*  ?ALBUMIN 3.0* 2.8* 2.5*  ? ? ?No results for input(s): LIPASE, AMYLASE in the last 168 hours. ?No results for input(s): AMMONIA in the last 168 hours. ?CBC: ?Recent Labs  ?Lab 05/12/21 ?1124 05/12/21 ?2010 05/15/21 ?1840 05/16/21 ?0326 05/17/21 ?0310 05/18/21 ?0206 05/19/21 ?0093  ?WBC 13.4*   < > 8.6 8.1 9.0 8.1 7.2  ?NEUTROABS 11.4*  --   --   --   --   --   --   ?HGB 6.9*   < > 8.7* 8.3* 9.2* 8.9* 9.8*  ?HCT 20.9*   < > 26.5* 25.5* 27.8* 27.9* 30.3*  ?MCV 90.1   < > 87.2 88.2 87.4 88.9 87.1  ?  PLT 257   < > 220 205 227 213 230  ? < > = values in this interval not displayed.  ? ? ?Cardiac Enzymes: ?Recent Labs  ?Lab 05/12/21 ?5038  ?CKTOTAL 271  ? ? ?CBG: ?Recent Labs  ?Lab 05/18/21 ?0803 05/18/21 ?1141 05/18/21 ?1730 05/18/21 ?2102 05/19/21 ?8828  ?GLUCAP 96 115* 138* 188* 100*  ? ? ? ?Iron Studies: No results for input(s): IRON, TIBC, TRANSFERRIN, FERRITIN in the last 72 hours. ?Studies/Results: ?No results found. ? ?Medications: ?Infusions: ? ? ?Scheduled Medications: ? sodium chloride   Intravenous Once  ? aspirin EC  81 mg Oral Daily  ? carvedilol  12.5 mg Oral BID WC  ? Chlorhexidine Gluconate Cloth  6 each Topical Q0600  ? [START ON 05/20/2021] darbepoetin (ARANESP) injection - DIALYSIS  100 mcg Intravenous Q Wed-HD  ? DULoxetine  60 mg Oral Daily  ? ezetimibe  10 mg Oral Daily  ? fluticasone  1 spray Each Nare Daily  ? insulin aspart  0-6 Units Subcutaneous TID WC  ? insulin glargine-yfgn  18 Units Subcutaneous QHS  ? isosorbide  mononitrate  15 mg Oral Daily  ? melatonin  3 mg Oral QHS  ? nicotine  14 mg Transdermal Daily  ? pantoprazole  40 mg Oral Daily  ? rosuvastatin  10 mg Oral Daily  ? senna-docusate  1 tablet Oral QHS  ? ? have reviewed scheduled and prn medications. ? ?Physical Exam: ?General: Able to lie flat, not in distress. ?Heart:RRR, s1s2 nl ?Lungs: Clear b/l, no crackle ?Abdomen:soft, Non-tender, non-distended ?Extremities:No edema, right BKA. ?Dialysis Access: Right IJ TDC in place. ? ?Antoine ?05/19/2021,9:09 AM ? LOS: 7 days  ? ?

## 2021-05-19 NOTE — Progress Notes (Signed)
? ?Subjective:  ?Patient was seen this AM.  He continues to endorse left-sided back pain and LLQ pain which has not responded to tylenol but did respond to dilaudid yesterday. States that he just feels tired and wants to leave the hospital. No other complaints or concerns today. ? ?Objective: ? ?Vital signs in last 24 hours: ?Vitals:  ? 05/19/21 0215 05/19/21 0220 05/19/21 0506 05/19/21 0917  ?BP: (!) 148/75 (!) 148/72 (!) 151/74 (!) 143/78  ?Pulse: 70 72 76 75  ?Resp:  '18 18 16  '$ ?Temp:  98.4 ?F (36.9 ?C) 98 ?F (36.7 ?C) 98.6 ?F (37 ?C)  ?TempSrc:  Oral    ?SpO2:  99% 100% 100%  ?Weight:  72.3 kg    ?Height:      ? ? ?Constitutional: Chronically ill appearing, lying in bed and in no distress ?HENT:  ?Head: Normocephalic and atraumatic.  ?Eyes: EOM are normal.  ?Neck: Normal range of motion.  ?Cardiovascular: Normal rate, regular rhythm, intact distal pulses. No gallop and no friction rub.  ?No murmur heard. No lower extremity edema  ?Pulmonary: Non labored breathing on room air, no wheezing or rales  ?Abdominal: Soft. Normal bowel sounds. Non distended, non tender ?Musculoskeletal: Normal range of motion. S/p R BKA, s/p left transmetatarsal amputation  ?   General: No tenderness or edema.  ?Neurological: Alert and oriented to person, place, and time. Non focal  ?Skin: Skin is warm and dry.  ? ?Assessment/Plan: ? ?Principal Problem: ?  Acute blood loss anemia ?Active Problems: ?  ESRD on hemodialysis ([place]) ? ?#Large retroperitoneal hematoma  ?#Symptomatic anemia, improving ?Patient developed left lower abdominal pain, flank pain, hematuria, and dizziness after a renal biopsy 5/5 with imaging revealing a large retroperitoneal hematoma.  No intervenable targets for embolization on CT abdomen and pelvis.  Received total of 2 units PRBCs, hemoglobin has been stable overall, most recently at 8.9. Patient has had multiple SNF bed offers; discharge pending insurance auth for SNF. ?-IR is on board, appreciate their  recommendations ?-Trend CBC ?-Transfuse for Hgb <7 ?-PT and OT recommending SNF ? ?#H/o CKD stage IV with recent progression to ESRD ?It appears his CKD is secondary to his poorly controlled diabetes and hypertension.  Tunneled cath was placed by IR on 5/10, has been receiving HD tx in the hospital. AVF placement by vascular surgery has been postponed to early June due to his recent type II NSTEMI. Otherwise, patient has been accepted at [facility] as outpatient for MWF sessions. ?-Nephrology is on board, appreciate their recommendations ?-Vascular surgery is on board, appreciate their recommendations ?-HD per nephrology ?-Avoid nephrotoxic medications ?-Trend BMP ? ?#Type 2 NSTEMI ?#New ischemic cardiomyopathy ?Patient presented with elevated troponins, peaked at 1700 and subsequently down trended. Given his acute blood loss anemia, he was thought to have a type II NSTEMI. Echo showed EF 30-35% with G2DD. Cardiology was consulted and agree this is a type II NSTEMI. Per their recs, he would eventually would benefit from [facility] when he is OK from anemia standpoint (can be pursued as outpatient). ?-Continue ASA 81 mg  ?-Rosuvastatin 10 mg p.o. daily ?-Zetia 10 mg p.o. daily ?-Low-dose Imdur ?-Avoiding MRA/SGLT2/ARNI in the setting of ESRD ? ?#HTN ?Currently on home Coreg and amlodipine. Systolic BP have been ranging in the 130s-150s most recently. Does not appear hypervolemic on exam. ?-Continue to monitor ? ?#Poorly-controlled type 2 diabetes ?Patient most recent A1c 11.9 (01/2021).  At home he takes glargine 22 units nightly and 11 units 3 times  daily with meals.   ?-Continue Semglee to 18 units daily at bedtime ?-CBG monitoring ?-SSI ? ?#HFrEF ?Appears compensated on exam.  Most recent echo was performed on 5/10, showing EF 30-35% with moderately decreased function of left ventricle and G2DD. ?-CTM ? ?Prior to Admission Living Arrangement: Home ?Anticipated Discharge Location: SNF ?Barriers to Discharge: Placement,  likely SNF ?Dispo: Anticipated discharge pending placement ? ?[name] Brill, MD ?05/19/2021, 10:47 AM ?Pager: [phone]  ?After 5pm on weekdays and 1pm on weekends: On Call pager [phone]  ?

## 2021-05-20 DIAGNOSIS — N184 Chronic kidney disease, stage 4 (severe): Secondary | ICD-10-CM

## 2021-05-20 DIAGNOSIS — I129 Hypertensive chronic kidney disease with stage 1 through stage 4 chronic kidney disease, or unspecified chronic kidney disease: Secondary | ICD-10-CM

## 2021-05-20 DIAGNOSIS — N179 Acute kidney failure, unspecified: Secondary | ICD-10-CM

## 2021-05-20 LAB — BASIC METABOLIC PANEL
Anion gap: 10 (ref 5–15)
BUN: 20 mg/dL (ref 8–23)
CO2: 27 mmol/L (ref 22–32)
Calcium: 7.9 mg/dL — ABNORMAL LOW (ref 8.9–10.3)
Chloride: 99 mmol/L (ref 98–111)
Creatinine, Ser: 4.12 mg/dL — ABNORMAL HIGH (ref 0.61–1.24)
GFR, Estimated: 16 mL/min — ABNORMAL LOW (ref >60)
Glucose, Bld: 58 mg/dL — ABNORMAL LOW (ref 70–99)
Potassium: 3.4 mmol/L — ABNORMAL LOW (ref 3.5–5.1)
Sodium: 136 mmol/L (ref 135–145)

## 2021-05-20 LAB — GLUCOSE, CAPILLARY
Glucose-Capillary: 66 mg/dL — ABNORMAL LOW (ref 70–99)
Glucose-Capillary: 158 mg/dL — ABNORMAL HIGH (ref 70–99)
Glucose-Capillary: 151 mg/dL — ABNORMAL HIGH (ref 70–99)
Glucose-Capillary: 70 mg/dL (ref 70–99)
Glucose-Capillary: 159 mg/dL — ABNORMAL HIGH (ref 70–99)

## 2021-05-20 LAB — CBC
HCT: 35.4 % — ABNORMAL LOW (ref 39.0–52.0)
Hemoglobin: 11.3 g/dL — ABNORMAL LOW (ref 13.0–17.0)
MCH: 27.9 pg (ref 26.0–34.0)
MCHC: 31.9 g/dL (ref 30.0–36.0)
MCV: 87.4 fL (ref 80.0–100.0)
Platelets: 261 10*3/uL (ref 150–400)
RBC: 4.05 MIL/uL — ABNORMAL LOW (ref 4.22–5.81)
RDW: 14.3 % (ref 11.5–15.5)
WBC: 11.6 10*3/uL — ABNORMAL HIGH (ref 4.0–10.5)
nRBC: 0 % (ref 0.0–0.2)

## 2021-05-20 MED ORDER — ROSUVASTATIN CALCIUM 10 MG PO TABS: 10.0000 mg | ORAL_TABLET | Freq: Every day | ORAL | Status: DC

## 2021-05-20 MED ORDER — SENNOSIDES-DOCUSATE SODIUM 8.6-50 MG PO TABS: 1.0000 | ORAL_TABLET | Freq: Every day | ORAL | 0 refills | Status: DC

## 2021-05-20 MED ORDER — LIDOCAINE HCL (PF) 1 % IJ SOLN: 5.0000 mL | INTRAMUSCULAR | Status: DC | PRN

## 2021-05-20 MED ORDER — NICOTINE 14 MG/24HR TD PT24: 14.0000 mg | MEDICATED_PATCH | Freq: Every day | TRANSDERMAL | 0 refills | Status: DC

## 2021-05-20 MED ORDER — HEPARIN SODIUM (PORCINE) 1000 UNIT/ML DIALYSIS
20.0000 [IU]/kg | INTRAMUSCULAR | Status: DC | PRN
  Administered 2021-05-20: 1400 [IU] via INTRAVENOUS_CENTRAL
  Filled 2021-05-20: qty 2

## 2021-05-20 MED ORDER — DARBEPOETIN ALFA 100 MCG/0.5ML IJ SOSY: 100.0000 ug | PREFILLED_SYRINGE | INTRAMUSCULAR | Status: DC

## 2021-05-20 MED ORDER — SODIUM CHLORIDE 0.9 % IV SOLN: 100.0000 mL | INTRAVENOUS | Status: DC | PRN

## 2021-05-20 MED ORDER — PENTAFLUOROPROP-TETRAFLUOROETH EX AERO: 1.0000 "application " | INHALATION_SPRAY | CUTANEOUS | Status: DC | PRN

## 2021-05-20 MED ORDER — INSULIN GLARGINE-YFGN 100 UNIT/ML ~~LOC~~ SOLN: 16.0000 [IU] | Freq: Every day | SUBCUTANEOUS | 0 refills | Status: DC

## 2021-05-20 MED ORDER — ALTEPLASE 2 MG IJ SOLR: 2.0000 mg | Freq: Once | INTRAMUSCULAR | Status: DC | PRN

## 2021-05-20 MED ORDER — HEPARIN SODIUM (PORCINE) 1000 UNIT/ML DIALYSIS
1000.0000 [IU] | INTRAMUSCULAR | Status: DC | PRN
  Administered 2021-05-20: 1000 [IU] via INTRAVENOUS_CENTRAL
  Filled 2021-05-20: qty 1

## 2021-05-20 MED ORDER — CARVEDILOL 6.25 MG PO TABS
18.7500 mg | ORAL_TABLET | Freq: Two times a day (BID) | ORAL | Status: DC
Start: 2021-05-20 — End: 2021-05-21
  Administered 2021-05-20: 18.75 mg via ORAL
  Filled 2021-05-20: qty 3

## 2021-05-20 MED ORDER — ISOSORBIDE MONONITRATE ER 30 MG PO TB24: 15.0000 mg | ORAL_TABLET | Freq: Every day | ORAL | 0 refills | Status: DC

## 2021-05-20 MED ORDER — ASPIRIN 81 MG PO TBEC
81.0000 mg | DELAYED_RELEASE_TABLET | Freq: Every day | ORAL | 11 refills | Status: DC
Start: 2021-05-21 — End: 2021-06-03

## 2021-05-20 MED ORDER — LIDOCAINE-PRILOCAINE 2.5-2.5 % EX CREA: 1.0000 "application " | TOPICAL_CREAM | CUTANEOUS | Status: DC | PRN

## 2021-05-20 MED ORDER — AMLODIPINE BESYLATE 10 MG PO TABS: 10.0000 mg | ORAL_TABLET | Freq: Every day | ORAL | Status: DC

## 2021-05-20 MED ORDER — EZETIMIBE 10 MG PO TABS: 10.0000 mg | ORAL_TABLET | Freq: Every day | ORAL | 0 refills | Status: DC

## 2021-05-20 MED ORDER — MELATONIN 3 MG PO TABS: 3.0000 mg | ORAL_TABLET | Freq: Every day | ORAL | 0 refills | Status: DC

## 2021-05-20 MED ORDER — PANTOPRAZOLE SODIUM 40 MG PO TBEC: 40.0000 mg | DELAYED_RELEASE_TABLET | Freq: Every day | ORAL | Status: DC

## 2021-05-20 NOTE — Discharge Summary (Signed)
? ?Name: [name] ?MRN: [id number] ?DOB: 12-13-1960 61 y.o. ?PCP: [name], MD ? ?Date of Admission: 05/12/2021 10:56 AM ?Date of Discharge: 05/20/2021 ?Attending Physician: Campbell Riches, MD ? ?Discharge Diagnosis: ?1. Post procedural hematoma  ?2. ESRD  ? ?Discharge Medications: ?Allergies as of 05/20/2021   ? ?   Reactions  ? Benicar [olmesartan] Cough  ? ?  ? ?  ?Medication List  ?  ? ?STOP taking these medications   ? ?albuterol 108 (90 Base) MCG/ACT inhaler ?Commonly known as: VENTOLIN HFA ?  ?Basaglar KwikPen 100 UNIT/ML ?  ?esomeprazole 40 MG capsule ?Commonly known as: [place] ?Replaced by: pantoprazole 40 MG tablet ?  ?eucerin cream ?  ?NovoLOG FlexPen 100 UNIT/ML FlexPen ?Generic drug: insulin aspart ?  ?oxyCODONE-acetaminophen 5-325 MG tablet ?Commonly known as: Percocet ?  ?pregabalin 50 MG capsule ?Commonly known as: Lyrica ?  ? ?  ? ?TAKE these medications   ? ?Accu-Chek Aviva Plus test strip ?Generic drug: glucose blood ?Check blood sugar 3 times a day ?  ?Accu-Chek Aviva Plus w/Device Kit ?Check blood sugar 3 times a day ?  ?Accu-Chek FastClix Lancets Misc ?Check blood sugar 3 times a day ?  ?amLODipine 10 MG tablet ?Commonly known as: NORVASC ?Take 1 tablet (10 mg total) by mouth daily. ?  ?aspirin 81 MG EC tablet ?Take 1 tablet (81 mg total) by mouth daily. Swallow whole. ?Start taking on: May 21, 2021 ?  ?BENADRYL ITCH RELIEF STICK 2-0.1 % Stck ?Generic drug: diphenhydrAMINE-Zinc Acetate ?Apply 1 Bar topically as needed. ?  ?blood glucose meter kit and supplies Kit ?Dispense based on patient and insurance preference. Use up to four times daily as directed. (FOR ICD-9 250.00, 250.01). ?  ?carvedilol 6.25 MG tablet ?Commonly known as: COREG ?Take 1 tablet (6.25 mg total) by mouth 2 (two) times daily with a meal. ?  ?cetirizine 10 MG tablet ?Commonly known as: ZyrTEC Allergy ?Take 1 tablet (10 mg total) by mouth daily. ?  ?Darbepoetin Alfa 100 MCG/0.5ML Sosy injection ?Commonly known  as: ARANESP ?Inject 0.5 mLs (100 mcg total) into the vein every Wednesday with hemodialysis. ?  ?DULoxetine 30 MG capsule ?Commonly known as: CYMBALTA ?Take 2 capsules (60 mg total) by mouth daily. ?  ?ezetimibe 10 MG tablet ?Commonly known as: ZETIA ?Take 1 tablet (10 mg total) by mouth daily. ?Start taking on: May 21, 2021 ?  ?fluticasone 50 MCG/ACT nasal spray ?Commonly known as: FLONASE ?Place 1 spray into both nostrils daily. ?  ?FreeStyle Libre 2 Sensor Misc ?Place 1 sensor on the skin every 14 days. Use to check glucose continuously ?  ?insulin glargine-yfgn 100 UNIT/ML injection ?Commonly known as: SEMGLEE ?Inject 0.16 mLs (16 Units total) into the skin at bedtime. ?  ?Insulin Pen Needle 32G X 4 MM Misc ?Use to inject insulin 4 times a day ?  ?isosorbide mononitrate 30 MG 24 hr tablet ?Commonly known as: IMDUR ?Take 0.5 tablets (15 mg total) by mouth daily. ?  ?melatonin 3 MG Tabs tablet ?Take 1 tablet (3 mg total) by mouth at bedtime. ?  ?nicotine 14 mg/24hr patch ?Commonly known as: NICODERM CQ - dosed in mg/24 hours ?Place 1 patch (14 mg total) onto the skin daily. ?Start taking on: May 21, 2021 ?  ?pantoprazole 40 MG tablet ?Commonly known as: PROTONIX ?Take 1 tablet (40 mg total) by mouth daily. ?Start taking on: May 21, 2021 ?Replaces: esomeprazole 40 MG capsule ?  ?rosuvastatin 10 MG tablet ?Commonly known as: CRESTOR ?Take  1 tablet (10 mg total) by mouth daily. ?Start taking on: May 21, 2021 ?What changed:  ?medication strength ?how much to take ?  ?senna-docusate 8.6-50 MG tablet ?Commonly known as: Senokot-S ?Take 1 tablet by mouth at bedtime. ?  ? ?  ? ?  ?  ? ? ?  ?Durable Medical Equipment  ?(From admission, onward)  ?  ? ? ?  ? ?  Start     Ordered  ? 05/14/21 1125  For home use only DME Walker rolling  Once       ?Question Answer Comment  ?Walker: With 5 Inch Wheels   ?Patient needs a walker to treat with the following condition Gait instability   ?  ? 05/14/21 1124  ? ?  ?  ? ?   ? ? ?Disposition and follow-up:   ?[name] was discharged from [facility] in Stable condition.  At the hospital follow up visit please address: ? ?1.  Blood glucose and insulin regimen, Cardiology appointment for ischemic evaluation  ? ?2.  Labs / imaging needed at time of follow-up: CBC ? ?3.  Pending labs/ test needing follow-up: None ? ?Follow-up Appointments: ? Follow-up Information   ? ? [place], [facility] Follow up.   ?Why: Schedule is Monday, Wednesday, Friday with 11:40 chair time.  ?For first appointment, patient will need to arrive at 11:00 to complete paperwork.  ?Patient can start on Monday, May 15. ?Contact information: ?[address] ?[place] [address] ?[phone] ? ? ?  ?  ? ?  ?  ? ?  ? ? ?Hospital Course by problem list: ? ?#Large retroperitoneal hematoma  ?#Symptomatic anemia, improving ?Patient presented with left lower abdominal pain, flank pain, hematuria, and dizziness after a renal biopsy performed on 5/5 (four days prior to admission). He was noted to have a Hgb of 6.9 on admit. Imaging here at the hospital showed large retroperitoneal hematoma.  He underwent a CT a of the abdomen and pelvis which showed no evidence of a potential intervenable target for embolization.  He received 2 units PRBCs after which his hemoglobin appropriately responded and remained stable for the rest of his hospitalization.  ? ?Patient will need a CBC in 1-2 weeks to ensure stability.  ? ?  ?#H/o CKD stage IV with recent progression to ESRD ?It appears his CKD is secondary to his poorly controlled diabetes and hypertension given renal biopsy results.  Tunneled HD cath was placed by IR on 5/10, patient has been receiving HD tx in the hospital. AVF placement by vascular surgery has been postponed to early June due to his recent type II NSTEMI. Otherwise, patient has been accepted at [facility] as outpatient for MWF hemodialysis sessions. ?  ?#NSTEMI ?#New  ischemic cardiomyopathy ?Patient presented with elevated troponins which  peaked at 1743, EKG with deep St depressions and T wave inversions in the inferolateral leads. He had no angina or anginal equivalents (no dyspnea), he also had no signs or symptoms of decompensated HF. Given his acute blood loss anemia, he was thought to have a type II NSTEMI. Echo did however show: EF of 30 to 35%, moderately decreased function of the LV, wall motion was difficult to assess although basal to mid septal segments appeared hypokinetic and all anterior LV segments, findings consistent with grade 2 diastolic dysfunction.  Right ventricular systolic function is mildly reduced with mildly elevated PA systolic pressure.  Mild to moderate mitral valve regurgitation.  No aortic  valve stenosis or aortic valve regurgitation noted. Cardiology was consulted, and felt that due to patient's lack of symptoms and recent acute blood loss anemia ischemic evaluation could be pursued in the outpatient setting. outpatient). ?-Continue ASA 81 mg  ?-Rosuvastatin 10 mg p.o. daily ?-Continue COREG ?-Zetia 10 mg p.o. daily ?-Low-dose Imdur ?-Per cardiology consider addition of hydralazine if blood pressures will allow  ?-Avoiding MRA/SGLT2/ARNI in the setting of ESRD ?  ?#HTN ?Currently on home Coreg and amlodipine. Systolic BP have been ranging in the 130s-150s most recently. Will continue this current regimen, can consider addition of hydralazine per cardiology.   ?  ?#Poorly-controlled type 2 diabetes ?Patient most recent A1c 11.9 (01/2021).  At home he takes glargine 22 units nightly and 11 units 3 times daily with meals.   ?-Semglee was reduced to 16 units daily at bedtime given some low blood sugars in the AM. He will need continued CBG monitoring and adjustment of his regimen in the outpatient setting.  ? ?  ?#HFrEF ?Appears compensated on exam.  Most recent echo was performed on 5/10, showing EF 30-35% with moderately decreased function of left  ventricle and G2DD. ?-CTM ?  ? ?Discharge Exam:   ?BP 126/73 (BP Location: Left Arm)   Pulse 71   Temp (!) 97.5 ?F (36.4 ?C) (Oral)   Resp 18   Ht 5' 11" (1.803 m)   Wt 72.3 kg   SpO2 96%   BMI 22.23 kg/m?  ?Discharge e

## 2021-05-20 NOTE — Progress Notes (Signed)
removed 1523ms net fluid. no complaints no complications. educated pt on need for dialysis, the cause of ESRD.  pre bp 164/87 post bp 160/80 pre weight 69.9kg post weight 68.0kg bed scales.  catheter ran well, packed with heparin clamped and capped. dressing to right chest changed.  gave heparin 1400 units as ordered.  tolerated tx well ?

## 2021-05-20 NOTE — Progress Notes (Signed)
Inpatient Diabetes Program Recommendations ? ?AACE/ADA: New Consensus Statement on Inpatient Glycemic Control (2015) ? ?Target Ranges:  Prepandial:   less than 140 mg/dL ?     Peak postprandial:   less than 180 mg/dL (1-2 hours) ?     Critically ill patients:  140 - 180 mg/dL  ? ?Lab Results  ?Component Value Date  ? GLUCAP 158 (H) 05/20/2021  ? HGBA1C 11.9 (A) 01/27/2021  ? ? ?Review of Glycemic Control ? Latest Reference Range & Units 05/19/21 07:24 05/19/21 11:51 05/19/21 16:52 05/19/21 20:30 05/20/21 05:58 05/20/21 07:21  ?Glucose-Capillary 70 - 99 mg/dL 100 (H) 123 (H) 96 117 (H) 66 (L) 158 (H)  ?(H): Data is abnormally high ?(L): Data is abnormally low ? ?Current Inpatient Regimen for Glycemic Control: ?Semglee 18 units qhs ?Novolog 0-6 units tid ? ?Inpatient Diabetes Program Recommendations:   ? ?-   Please consider decreasing Semglee to 16 units QHS ? ?Will continue to follow while inpatient. ? ?Thanks, ? ?Tama Headings RN, MSN, BC-ADM ?Inpatient Diabetes Coordinator ?Team Pager [phone] (8a-5p) ?

## 2021-05-20 NOTE — TOC Transition Note (Signed)
Transition of Care (TOC) - CM/SW Discharge Note ? ? ?Patient Details  ?Name: [name] ?MRN: [id number] ?Date of Birth: November 02, 1960 ? ?Transition of Care (TOC) CM/SW Contact:  ?Paulene Floor Ivis Nicolson, LCSWA ?Phone Number: ?05/20/2021, 12:13 PM ? ? ?Clinical Narrative:    ?Patient will DC to:  [facility] ?Anticipated DC date: 05/20/2021 ?Family notified: Yes ?Transport by: Corey Harold ? ? ?Per MD patient ready for DC to SNF. RN to call report prior to discharge [phone] room 220. RN, patient, patient's family, and facility notified of DC. Discharge Summary and FL2 sent to facility. DC packet on chart. Ambulance transport will be requested for patient after his dialysis treatment.  ? ?CSW will sign off for now as social work intervention is no longer needed. Please consult [place] again if new needs arise. ?  ? ? ?Final next level of care: [place] ?Barriers to Discharge: Barriers Resolved ? ? ?Patient Goals and CMS Choice ?Patient states their goals for this hospitalization and ongoing recovery are:: To get around (mobilize) like he "used to". ?CMS Medicare.gov Compare Post Acute Care list provided to:: Patient ?Choice offered to / list presented to : Patient ? ?Discharge Placement ?  ?           ?Patient chooses bed at:  [facility]) ?Patient to be transferred to facility by: [facility] ?Name of family member notified: Lysbeth Penner     [phone] ?Patient and family notified of of transfer: 05/20/21 ? ?Discharge Plan and Services ?  ?  ?           ?  ?  ?  ?  ?  ?  ?  ?  ?  ?  ? ?Social Determinants of Health (SDOH) Interventions ?  ? ? ?Readmission Risk Interventions ?   ? View : No data to display.  ?  ?  ?  ? ? ? ? ? ?

## 2021-05-20 NOTE — Discharge Instructions (Signed)
Were seen in the hospital for a bleed after having a kidney biopsy.  Your blood levels were stable after receiving some blood while you are here.  You will attend hemodialysis on Monday, Wednesday, and Friday in the outpatient setting.  You have been started on some medications by our heart doctors, please take these as prescribed. This includes daily aspirin now that your blood levels have been stable.  Please follow-up with your primary care doctor within the week to discuss your recent hospitalization.  If any symptoms change or worsen acutely, please return to the nearest emergency department.  ?

## 2021-05-20 NOTE — Progress Notes (Signed)
Advised at progression this am that pt is for d/c to snf today. Contacted [facility] and spoke to [place]. Clinic aware pt to d/c to snf today and will start on Friday. Contacted renal PA regarding clinic's need for orders. Contacted Deloris (person that pt requested that I discuss HD arrangements with) via phone to make her aware that if pt d/c today, pt will need to be at the clinic on Friday at 11:00 to complete paperwork prior to 11:40 chair time. Deloris requested that clinic info and schedule be text to her. Information sent via text per her request. CSW has advised snf of pt's out-pt arrangements and that pt will need to arrive at 11:00 on Friday. Arrangements added to AVS as well.  ? ?Melven Sartorius ?Renal Navigator ?[phone] ?

## 2021-05-20 NOTE — Progress Notes (Signed)
Patient is discharging to [facility] today, gave report to staff nurse Evlyn Clines) and answered all her questions.  [facility] called already, waiting to be picked by them.  On coming nurse will be made aware of the situation.   ?

## 2021-05-20 NOTE — Progress Notes (Signed)
Physical Therapy Treatment ?Patient Details ?Name: [name] ?MRN: [id number] ?DOB: 1960-09-27 ?Today's Date: 05/20/2021 ? ? ?History of Present Illness Pt is a 61 y.o. M who presents with fatigue, hematuria, and L flank pain. Pt had L renal biopsy 5/5; developed hematuria and abdominal/back pain around the left kidney 5/9. CT scan revealed a large RP hematoma; Hgb 7 from baseline 12. Also developed Type II NSTEMI due to acute severe anemia.  Now on HD with significant medical history of PAD s/p stent and left midfoot amputation, right BKA,  DM, HTN, CKD5 p/w RP bleed following renal biopsy ? ?  ?PT Comments  ? ? Patient continues to be limited by fatigue and pain. Patient requires max encouragement to participate with therapy. Patient with poor safety awareness despite being aware of current deficits. Patient refusing to use RW during mobility in room and required minA for balance as patient reaching for objects to grasp. Patient requires increased time and extensive rest breaks between minimal activity. Educated patient on role of PT and importance of mobility throughout the day on his recovery as he was independent PTA, patient verbalized understanding but not sure if he fully grasps importance. Continue to recommend SNF for ongoing Physical Therapy.    ?   ?Recommendations for follow up therapy are one component of a multi-disciplinary discharge planning process, led by the attending physician.  Recommendations may be updated based on patient status, additional functional criteria and insurance authorization. ? ?Follow Up Recommendations ? Skilled nursing-short term rehab (<3 hours/day) ?  ?  ?Assistance Recommended at Discharge Frequent or constant Supervision/Assistance  ?Patient can return home with the following A little help with walking and/or transfers;Assistance with cooking/housework;Direct supervision/assist for medications management;Direct supervision/assist for financial management;Assist for  transportation;Help with stairs or ramp for entrance ?  ?Equipment Recommendations ? Rolling Aaliyan Brinkmeier (2 wheels)  ?  ?Recommendations for Other Services   ? ? ?  ?Precautions / Restrictions Precautions ?Precautions: Fall;Other (comment) ?Precaution Comments: R BKA (has prosthetic in room), hx of L forefoot amputation  ?  ? ?Mobility ? Bed Mobility ?Overal bed mobility: Needs Assistance ?Bed Mobility: Supine to Sit, Sit to Supine ?  ?  ?Supine to sit: Min assist ?Sit to supine: Supervision ?  ?General bed mobility comments: required extensive time and max encouragement to come to EOB. Patient reaching out for therapist hand to assist. Use of momentum to reach EOB. Attempted to lay back down during donning of prosthesis but with encouragement remained sitting ?  ? ?Transfers ?Overall transfer level: Needs assistance ?Equipment used: None ?Transfers: Sit to/from Stand ?Sit to Stand: Min assist ?  ?  ?  ?  ?  ?General transfer comment: minA to steady upon standing due to patient refusing to utilize RW despite awareness of his deficits. Patient required increased time to "prepare" to stand due to fatigue. Sit to stand x 3 (from bed, toilet x 2) ?  ? ?Ambulation/Gait ?Ambulation/Gait assistance: Min assist ?Gait Distance (Feet): 8 Feet (+8) ?Assistive device: None ?Gait Pattern/deviations: Step-through pattern, Decreased stride length, Drifts right/left, Trunk flexed ?Gait velocity: decreased ?  ?  ?General Gait Details: Refusing to use RW for mobility despite max education. Patient reaching for objects to grasp during short ambulation to bathroom. MinA for balance and safety ? ? ?Stairs ?  ?  ?  ?  ?  ? ? ?Wheelchair Mobility ?  ? ?Modified Rankin (Stroke Patients Only) ?  ? ? ?  ?Balance Overall balance assessment: Needs assistance ?Sitting-balance  support: Feet supported ?Sitting balance-Leahy Scale: Good ?  ?  ?Standing balance support: No upper extremity supported, During functional activity ?Standing balance-Leahy  Scale: Poor ?Standing balance comment: needs UE support for steadying ?  ?  ?  ?  ?  ?  ?  ?  ?  ?  ?  ?  ? ?  ?Cognition Arousal/Alertness: Awake/alert ?Behavior During Therapy: Flat affect ?Overall Cognitive Status: Impaired/Different from baseline ?Area of Impairment: Safety/judgement, Memory ?  ?  ?  ?  ?  ?  ?  ?  ?  ?  ?Memory: Decreased short-term memory ?  ?Safety/Judgement: Decreased awareness of safety, Decreased awareness of deficits ?  ?  ?General Comments: Poor safety awareness and not wanting to use RW despite weakness and reaching for objects during short ambulation ?  ?  ? ?  ?Exercises   ? ?  ?General Comments   ?  ?  ? ?Pertinent Vitals/Pain Pain Assessment ?Pain Assessment: Faces ?Faces Pain Scale: Hurts little more ?Pain Location: back, abdomen ?Pain Descriptors / Indicators: Discomfort  ? ? ?Home Living   ?  ?  ?  ?  ?  ?  ?  ?  ?  ?   ?  ?Prior Function    ?  ?  ?   ? ?PT Goals (current goals can now be found in the care plan section) Acute Rehab PT Goals ?Patient Stated Goal: to get stronger ?PT Goal Formulation: With patient ?Time For Goal Achievement: 05/27/21 ?Potential to Achieve Goals: Fair ?Progress towards PT goals: Progressing toward goals ? ?  ?Frequency ? ? ? Min 3X/week ? ? ? ?  ?PT Plan Current plan remains appropriate  ? ? ?Co-evaluation   ?  ?  ?  ?  ? ?  ?AM-PAC PT "6 Clicks" Mobility   ?Outcome Measure ? Help needed turning from your back to your side while in a flat bed without using bedrails?: A Little ?Help needed moving from lying on your back to sitting on the side of a flat bed without using bedrails?: A Little ?Help needed moving to and from a bed to a chair (including a wheelchair)?: A Little ?Help needed standing up from a chair using your arms (e.g., wheelchair or bedside chair)?: A Little ?Help needed to walk in hospital room?: A Lot ?Help needed climbing 3-5 steps with a railing? : Total ?6 Click Score: 15 ? ?  ?End of Session Equipment Utilized During Treatment:  Gait belt ?Activity Tolerance: Patient limited by fatigue;Patient limited by pain ?Patient left: in bed;with call bell/phone within reach;with bed alarm set (refused chair after setting up chair) ?Nurse Communication: Mobility status ?PT Visit Diagnosis: Difficulty in walking, not elsewhere classified (R26.2);Muscle weakness (generalized) (M62.81) ?  ? ? ?Time: 4580-9983 ?PT Time Calculation (min) (ACUTE ONLY): 36 min ? ?Charges:  $Gait Training: 8-22 mins ?$Therapeutic Activity: 8-22 mins          ?          ? ?Tishana Clinkenbeard A. Gilford Rile, PT, DPT ?Acute Rehabilitation Services ?Pager [phone] ?Office [phone] ? ? ? ?Harveer Sadler A Maikol Grassia ?05/20/2021, 12:21 PM ? ?

## 2021-05-20 NOTE — Progress Notes (Signed)
[facility] ?NEPHROLOGY PROGRESS NOTE ? ?Assessment/ Plan: ? 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 S/p tunn catheter on 5/10 with first tx on 5/10. ?He is receiving dialysis as per MWF schedule.  Vein mapping was done.  The vascular surgeon will place AV fistula as outpatient. Upon discharge he will be MWF schedule at [facility] for HD. ?Plan for regular dialysis today and then discharge to SNF. ? ?# Anemia Acute blood loss and CKD:  Retroperitoneal hematoma status post kidney biopsy. Iron is repleted and received blood products.  Continue Aranesp.  Monitor hemoglobin. ? ?# Retroperitoneal hematoma: Seen by IR, clinically stable. ? ?#Hyperkalemia: Improved with dialysis.  Now hypokalemic requiring 4K bath. ? ?#Metabolic acidosis: Improved with HD.  Monitor. ? ?#Hypertension: Blood pressure acceptable, volume optimized with dialysis. ? ?# NSTEMI: Per primary team, AV fistula surgery was postponed to outpatient. ? ?#CKD-MBD/secondary hyperparathyroidism: PTH 190.  Mild hyperphosphatemia managing with dialysis.   ? ?Subjective: Seen and examined.  No new event.  Reports feeling tired.  Plan to discharge to SNF today. ?Objective ?Vital signs in last 24 hours: ?Vitals:  ? 05/20/21 0514 05/20/21 0557 05/20/21 0639 05/20/21 0859  ?BP: (!) 199/92 (!) 193/91 (!) 176/77 126/73  ?Pulse: 78   71  ?Resp: 16   18  ?Temp: 97.8 ?F (36.6 ?C)   (!) 97.5 ?F (36.4 ?C)  ?TempSrc: Oral   Oral  ?SpO2: 98%   96%  ?Weight:      ?Height:      ? ?Weight change:  ? ?Intake/Output Summary (Last 24 hours) at 05/20/2021 1039 ?Last data filed at 05/20/2021 0865 ?Gross per 24 hour  ?Intake 720 ml  ?Output 600 ml  ?Net 120 ml  ? ? ? ? ? ? ?Labs: ?Basic Metabolic Panel: ?Recent Labs  ?Lab 05/17/21 ?0310 05/18/21 ?0206 05/19/21 ?7846 05/20/21 ?0518  ?NA 130* 130* 137 136  ?K 3.6 4.1 3.7 3.4*  ?CL 97* 97* 101 99  ?CO2 22 21* 29 27  ?GLUCOSE 125* 66* 120* 58*  ?BUN 35*  38* 18 20  ?CREATININE 5.87* 5.86* 3.61* 4.12*  ?CALCIUM 7.5* 7.4* 7.8* 7.9*  ?PHOS 5.2*  --   --   --   ? ? ?Liver Function Tests: ?Recent Labs  ?Lab 05/14/21 ?0839  ?AST 39  ?ALT 71*  ?ALKPHOS 94  ?BILITOT 1.3*  ?PROT 6.4*  ?ALBUMIN 2.5*  ? ? ?No results for input(s): LIPASE, AMYLASE in the last 168 hours. ?No results for input(s): AMMONIA in the last 168 hours. ?CBC: ?Recent Labs  ?Lab 05/16/21 ?0326 05/17/21 ?0310 05/18/21 ?0206 05/19/21 ?9629 05/20/21 ?0518  ?WBC 8.1 9.0 8.1 7.2 11.6*  ?HGB 8.3* 9.2* 8.9* 9.8* 11.3*  ?HCT 25.5* 27.8* 27.9* 30.3* 35.4*  ?MCV 88.2 87.4 88.9 87.1 87.4  ?PLT 205 227 213 230 261  ? ? ?Cardiac Enzymes: ?No results for input(s): CKTOTAL, CKMB, CKMBINDEX, TROPONINI in the last 168 hours. ? ?CBG: ?Recent Labs  ?Lab 05/19/21 ?1151 05/19/21 ?1652 05/19/21 ?2030 05/20/21 ?5284 05/20/21 ?1324  ?GLUCAP 123* 96 117* 66* 158*  ? ? ? ?Iron Studies: No results for input(s): IRON, TIBC, TRANSFERRIN, FERRITIN in the last 72 hours. ?Studies/Results: ?No results found. ? ?Medications: ?Infusions: ? ? ?Scheduled Medications: ? sodium chloride   Intravenous Once  ? aspirin EC  81 mg Oral Daily  ? carvedilol  18.75 mg Oral BID WC  ? Chlorhexidine Gluconate Cloth  6 each Topical Q0600  ? darbepoetin (  ARANESP) injection - DIALYSIS  100 mcg Intravenous Q Wed-HD  ? DULoxetine  60 mg Oral Daily  ? ezetimibe  10 mg Oral Daily  ? fluticasone  1 spray Each Nare Daily  ? insulin aspart  0-6 Units Subcutaneous TID WC  ? insulin glargine-yfgn  18 Units Subcutaneous QHS  ? isosorbide mononitrate  15 mg Oral Daily  ? melatonin  3 mg Oral QHS  ? nicotine  14 mg Transdermal Daily  ? pantoprazole  40 mg Oral Daily  ? rosuvastatin  10 mg Oral Daily  ? senna-docusate  1 tablet Oral QHS  ? ? have reviewed scheduled and prn medications. ? ?Physical Exam: ?General: Able to lie flat, not in distress.[name] ?Heart:RRR, s1s2 nl ?Lungs: Clear b/l, no crackle ?Abdomen:soft, Non-tender, non-distended ?Extremities:No edema, right  BKA. ?Dialysis Access: Right IJ TDC in place. ? ?[place] ?05/20/2021,10:39 AM ? LOS: 8 days  ? ?

## 2021-05-20 NOTE — Progress Notes (Signed)
Pt discharged to [facility] via [facility]. IV x2 removed. VSS. No complaints or distress. Telemetry discontinued. All belongings returned to patient, including cellphone and rolling walker.  ?

## 2021-05-20 NOTE — Progress Notes (Signed)
Spoke with both of pt's sisters via phone, informed them that pt will be discharged to facility-[facility]. Both sisters voiced concerns regarding quality of facility and that they may be taking pt out of facility tomorrow. This nurse also informed both sisters that prior arrangement and notification was made by Education officer, museum. Pt's point of contact per note, [name], notified of discharge and that pt will be going to Day facility.  ?

## 2021-05-20 NOTE — TOC Progression Note (Signed)
Transition of Care (TOC) - Initial/Assessment Note  ? ? ?Patient Details  ?Name: [name] ?MRN: [id number] ?Date of Birth: [birth year] ? ?Transition of Care (TOC) CM/SW Contact:    ?Paulene Floor Nayib Remer, LCSWA ?Phone Number: ?05/20/2021, 8:55 AM ? ?Clinical Narrative:                 ?Insurance auth. has been received and the facility can accept the patient today.  MD notified. ? ?Auth approved from 5/17 - 5/19, next review 5/19.  Reference ID: [id number].  Plan Auth ID: [id number] ? ?Expected Discharge Plan: [place] ?Barriers to Discharge: Ship broker, SNF Pending bed offer, Continued Medical Work up ? ? ?Patient Goals and CMS Choice ?Patient states their goals for this hospitalization and ongoing recovery are:: To get around (mobilize) like he "used to". ?CMS Medicare.gov Compare Post Acute Care list provided to:: Patient ?Choice offered to / list presented to : Patient ? ?Expected Discharge Plan and Services ?Expected Discharge Plan: [place] ?  ?  ?  ?Living arrangements for the past 2 months: Apartment ?                ?  ?  ?  ?  ?  ?  ?  ?  ?  ?  ? ?Prior Living Arrangements/Services ?Living arrangements for the past 2 months: Apartment ?Lives with:: Self ?Patient language and need for interpreter reviewed:: Yes ?Do you feel safe going back to the place where you live?: Yes      ?Need for Family Participation in Patient Care: Yes (Comment) ?Care giver support system in place?: Yes (comment) ?  ?Criminal Activity/Legal Involvement Pertinent to Current Situation/Hospitalization: No - Comment as needed ? ?Activities of Daily Living ?Home Assistive Devices/Equipment: None, Prosthesis ?ADL Screening (condition at time of admission) ?Patient's cognitive ability adequate to safely complete daily activities?: Yes ?Is the patient deaf or have difficulty hearing?: No ?Does the patient have difficulty seeing, even when wearing glasses/contacts?: No ?Does the patient have difficulty  concentrating, remembering, or making decisions?: No ?Patient able to express need for assistance with ADLs?: Yes ?Does the patient have difficulty dressing or bathing?: No ?Independently performs ADLs?: Yes (appropriate for developmental age) ?Does the patient have difficulty walking or climbing stairs?: No ?Weakness of Legs: None ?Weakness of Arms/Hands: None ? ?Permission Sought/Granted ?Permission sought to share information with : Other (comment) (CSW) ?Permission granted to share information with : Yes, Verbal Permission Granted ? Share Information with NAME: Delores ? Permission granted to share info w AGENCY: SNF ?   ?   ? ?Emotional Assessment ?Appearance:: Appears stated age ?Attitude/Demeanor/Rapport: Gracious, Engaged ?Affect (typically observed): Accepting, Hopeful, Pleasant ?Orientation: : Oriented to Self, Oriented to Place, Oriented to Situation ?Alcohol / Substance Use: Not Applicable ?Psych Involvement: No (comment) ? ?Admission diagnosis:  Acute blood loss anemia [D62] ?Retroperitoneal hematoma [K66.1] ?Acute cystitis with hematuria [N30.01] ?Acute renal failure superimposed on stage 4 chronic kidney disease, unspecified acute renal failure type ([place]) [N17.9, N18.4] ?Patient Active Problem List  ? Diagnosis Date Noted  ? Retroperitoneal hematoma   ? ESRD on hemodialysis ([place])   ? Acute blood loss anemia 05/12/2021  ? Colon polyps 03/03/2021  ? Mild cognitive impairment with memory loss 02/03/2021  ? Difficulty urinating 02/03/2021  ? Hx of medication noncompliance 09/30/2020  ? Rash 09/22/2020  ? S/P transmetatarsal amputation of foot, left ([place]) 11/23/2019  ? Wound of right leg 11/09/2019  ? CKD stage 4 due to type 2 diabetes  mellitus ([place]) 10/27/2018  ? S/P BKA (below knee amputation) unilateral, right ([place]) 08/17/2018  ? Pulmonary nodules/lesions, multiple 02/16/2018  ? Diabetic peripheral neuropathy ([place]) 05/30/2014  ? GERD (gastroesophageal reflux disease) 05/25/2013  ? Diastolic dysfunction  02/54/2706  ? Healthcare maintenance 02/15/2013  ? Chronic cough 06/01/2012  ? PVD (peripheral vascular disease) ([place]) 12/31/2011  ? Type 2 diabetes mellitus ([place]) 03/06/2008  ? HLD (hyperlipidemia) 03/06/2008  ? Essential hypertension 03/06/2008  ? ?PCP:  [name], MD ?Pharmacy:   ?[facility] ?[address] ?[place] [address] ?Phone: [phone] Fax: [phone] ? ?[name] ?[address] ?[place] [address] ?Phone: [phone] Fax: [phone] ? ?[facility] ?[address] ?[place] [address] ?Phone: [phone] Fax: [phone] ? ?[facility] [place] [facility] ?[place] [address] ?Phone: [phone] Fax: [phone] ? ? ? ? ?Social Determinants of Health (SDOH) Interventions ?  ? ?Readmission Risk Interventions ?   ? View : No data to display.  ?  ?  ?  ? ? ? ?

## 2021-05-21 ENCOUNTER — Non-Acute Institutional Stay (SKILLED_NURSING_FACILITY): Payer: Medicare Other | Admitting: Adult Health

## 2021-05-21 ENCOUNTER — Encounter: Payer: Self-pay | Admitting: Adult Health

## 2021-05-21 DIAGNOSIS — Z794 Long term (current) use of insulin: Secondary | ICD-10-CM

## 2021-05-21 DIAGNOSIS — D62 Acute posthemorrhagic anemia: Secondary | ICD-10-CM | POA: Diagnosis not present

## 2021-05-21 DIAGNOSIS — I255 Ischemic cardiomyopathy: Secondary | ICD-10-CM

## 2021-05-21 DIAGNOSIS — K661 Hemoperitoneum: Secondary | ICD-10-CM

## 2021-05-21 DIAGNOSIS — N186 End stage renal disease: Secondary | ICD-10-CM | POA: Diagnosis not present

## 2021-05-21 DIAGNOSIS — E1122 Type 2 diabetes mellitus with diabetic chronic kidney disease: Secondary | ICD-10-CM | POA: Diagnosis not present

## 2021-05-21 DIAGNOSIS — Z992 Dependence on renal dialysis: Secondary | ICD-10-CM

## 2021-05-21 DIAGNOSIS — N185 Chronic kidney disease, stage 5: Secondary | ICD-10-CM

## 2021-05-21 DIAGNOSIS — I10 Essential (primary) hypertension: Secondary | ICD-10-CM

## 2021-05-21 NOTE — Progress Notes (Signed)
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 advanced into the right internal jugular vein under direct, real-time ultrasound guidance. Ultrasound image documentation was performed. After securing guidewire access, an 8 Fr dilator was placed. A J-wire was kinked to measure appropriate catheter length. A Palindrome tunneled hemodialysis catheter measuring 19 cm from  tip to cuff was chosen for placement. This was tunneled in a retrograde fashion from the chest wall to the venotomy incision. At the venotomy, serial dilatation was performed and a 15 Fr peel-away sheath was placed over a guidewire. The catheter was then placed through the sheath and the sheath removed. Final catheter positioning was confirmed and documented with a fluoroscopic spot image. The catheter was aspirated, flushed with saline, and injected with appropriate volume heparin dwells. The venotomy incision was closed with subcuticular 4-0 Vicryl. Dermabond was applied to the incision. The catheter exit site was secured with 0-Prolene retention sutures. COMPLICATIONS: None.  No pneumothorax. FINDINGS: After catheter  placement, the tip lies in the right atrium. The catheter aspirates normally and is ready for immediate use. IMPRESSION: Placement of tunneled hemodialysis catheter via the right internal jugular vein. The catheter tip lies in the right atrium. The catheter is ready for immediate use. Electronically Signed   By: Aletta Edouard M.D.   On: 05/13/2021 17:26   ECHOCARDIOGRAM COMPLETE  Result Date: 05/13/2021    ECHOCARDIOGRAM LIMITED REPORT   Patient Name:   [facility] Date of Exam: 05/13/2021 Medical Rec #:  [id number]   Height:       71.0 in Accession #:    [id number]  Weight:       179.0 lb Date of Birth:  [birth year]   BSA:          2.011 m Patient Age:    61 years    BP:           162/84 mmHg Patient Gender: M           HR:           95 bpm. Exam Location:  Inpatient Procedure: 2D Echo, Cardiac Doppler and Color Doppler Indications:    Elevated troponin  History:        Patient has no prior history of Echocardiogram examinations.                 Risk Factors:Hypertension and Diabetes.  Sonographer:    Jyl Heinz Referring Phys: [id number] ERIK C HOFFMAN IMPRESSIONS  1. Left ventricular ejection fraction, by estimation, is 30 to 35%. The left ventricle has moderately decreased function. Wall motion difficult to assess due to incomplete visualization of the LV endocardium. Based on limited views, all anterior LV segments and the basal-to-mid septal segments appear hypokinetic. There is mild concentric left ventricular hypertrophy. Left ventricular diastolic parameters are consistent with Grade II diastolic dysfunction (pseudonormalization).  2. Right ventricular systolic function is moderately reduced. The right ventricular size is mildly enlarged. There is mildly elevated pulmonary artery systolic pressure. The estimated right ventricular systolic pressure is 35.4 mmHg.  3. The mitral valve is abnormal. Mild to moderate mitral valve regurgitation.  4. The aortic valve is tricuspid. There is mild calcification of the  aortic valve. There is mild thickening of the aortic valve. Aortic valve regurgitation is not visualized. Aortic valve sclerosis/calcification is present, without any evidence of aortic stenosis.  5. The inferior vena cava is dilated in size with <50% respiratory variability, suggesting right atrial pressure of 15 mmHg.  6. Findings concerning for biventricular dysfunction with moderately reduced LV and RV systolic function as well as wall motion abnormalities suggestive of possible LAD disease. Comparison(s): No prior Echocardiogram. FINDINGS  Left Ventricle: Left ventricular ejection fraction, by estimation, is 30 to 35%. The left ventricle has moderately decreased function. The left ventricle demonstrates regional wall motion abnormalities. The  left ventricular internal cavity size was normal in size. Wall motion difficult to assess due to incomplete visualization on the LV endocardium. Based on limited views, all anterior LV segments and the basal-to-mid septal segments appear hypokinetic. There is mild concentric left ventricular hypertrophy. Left ventricular diastolic parameters are consistent with Grade II diastolic dysfunction (pseudonormalization). Right Ventricle: The right ventricular size is mildly enlarged. Right ventricular systolic function is moderately reduced. There is mildly elevated pulmonary artery systolic pressure. The tricuspid regurgitant velocity is 2.67 m/s, and with an assumed right atrial pressure of 15 mmHg, the estimated right ventricular systolic pressure is 63.8 mmHg. Right Atrium: Right atrial size was normal in size. Pericardium: There is no evidence of pericardial effusion. Mitral Valve: The mitral valve is abnormal. Mild to moderate mitral valve regurgitation. Tricuspid Valve: The tricuspid valve is normal in structure. Tricuspid valve regurgitation is mild. Aortic Valve: The aortic valve is tricuspid. There is mild calcification of the aortic valve. There is mild thickening of  the aortic valve. Aortic valve regurgitation is not visualized. Aortic valve sclerosis/calcification is present, without any evidence of aortic stenosis. Aortic valve peak gradient measures 4.8 mmHg. Pulmonic Valve: The pulmonic valve was normal in structure. Pulmonic valve regurgitation is trivial. Aorta: The aortic root and ascending aorta are structurally normal, with no evidence of dilitation. Venous: The inferior vena cava is dilated in size with less than 50% respiratory variability, suggesting right atrial pressure of 15 mmHg. LEFT VENTRICLE PLAX 2D LVIDd:         5.10 cm      Diastology LVIDs:         4.00 cm      LV e' medial:    3.98 cm/s LV PW:         1.20 cm      LV E/e' medial:  24.0 LV IVS:        1.00 cm      LV e' lateral:   6.60 cm/s LVOT diam:     2.00 cm      LV E/e' lateral: 14.5 LV SV:         34 LV SV Index:   17 LVOT Area:     3.14 cm  LV Volumes (MOD) LV vol d, MOD A2C: 132.0 ml LV vol d, MOD A4C: 139.0 ml LV vol s, MOD A2C: 71.4 ml LV vol s, MOD A4C: 74.4 ml LV SV MOD A2C:     60.6 ml LV SV MOD A4C:     139.0 ml LV SV MOD BP:      62.6 ml RIGHT VENTRICLE            IVC RV Basal diam:  3.30 cm    IVC diam: 2.00 cm RV S prime:     5.70 cm/s TAPSE (M-mode): 1.4 cm LEFT ATRIUM             Index        RIGHT ATRIUM           Index LA diam:        3.60 cm 1.79 cm/m   RA Area:     15.40 cm LA Vol (A2C):   41.9 ml 20.83 ml/m  RA Volume:   39.80 ml  19.79 ml/m LA Vol (A4C):   47.7 ml 23.71 ml/m LA Biplane Vol: 45.7 ml 22.72 ml/m  AORTIC VALVE AV Area (Vmax): 1.98 cm AV Vmax:        110.00 cm/s AV Peak Grad:  4.8 mmHg LVOT Vmax:      69.40 cm/s LVOT Vmean:     43.800 cm/s LVOT VTI:       0.108 m  AORTA Ao Root diam: 2.70 cm Ao Asc diam:  3.10 cm MITRAL VALVE               TRICUSPID VALVE MV Area (PHT): 6.54 cm    TR Peak grad:   28.5 mmHg MV Decel Time: 116 msec    TR Vmax:        267.00 cm/s MR Peak grad: 99.6 mmHg MR Mean grad: 66.0 mmHg    SHUNTS MR Vmax:      499.00 cm/s  Systemic VTI:   0.11 m MR Vmean:     397.0 cm/s   Systemic Diam: 2.00 cm MV E velocity: 95.70 cm/s MV A velocity: 50.20 cm/s MV E/A ratio:  1.91 Gwyndolyn Kaufman MD Electronically signed by Gwyndolyn Kaufman MD Signature Date/Time: 05/13/2021/12:21:58 PM    Final    CT Renal Stone Study  Result Date: 05/12/2021 CLINICAL DATA:  Hemorrhage following left renal biopsy on 05/08/2021. Continued pain. EXAM: CT ABDOMEN AND PELVIS WITHOUT CONTRAST TECHNIQUE: Multidetector CT imaging of the abdomen and pelvis was performed following the standard protocol without IV contrast. RADIATION DOSE REDUCTION: This exam was performed according to the departmental dose-optimization program which includes automated exposure control, adjustment of the mA and/or kV according to patient size and/or use of iterative reconstruction technique. COMPARISON:  CT abdomen 05/08/2021 FINDINGS: Lower chest: Mild scarring and/or atelectasis in the lung bases. No pleural effusion. Coronary atherosclerosis. Decreased attenuation of the blood pool compatible with anemia. Hepatobiliary: Punctate liver calcifications. Collapsed gallbladder. No biliary dilatation. Pancreas: Unremarkable. Spleen: Unremarkable. Adrenals/Urinary Tract: Unremarkable adrenal glands. Right renal atrophy. There is a persistent subcapsular hematoma along the lateral aspect of the left kidney which is overall similar in size to the prior CT, however there is a large retroperitoneal hematoma extending from the lower pole of the kidney inferiorly into the pelvis measuring approximately 10.7 x 8.3 x 18.1 cm (transverse x AP x craniocaudal) which is largely new. No hydronephrosis. Mild diffuse bladder wall thickening. Stomach/Bowel: The stomach is unremarkable. There is no evidence of bowel obstruction. The appendix is unremarkable. Vascular/Lymphatic: Abdominal aortic atherosclerosis without aneurysm. No enlarged lymph nodes. Reproductive: Mildly enlarged prostate. Other: No ascites or  pneumoperitoneum. Musculoskeletal: No acute osseous abnormality or suspicious osseous lesion. IMPRESSION: 1. New large retroperitoneal hematoma extending from the lower pole of the left kidney inferiorly into the pelvis. 2. Left renal subcapsular hematoma, similar in size to the prior CT. 3. Diffuse bladder wall thickening, correlate for cystitis. 4. Aortic Atherosclerosis (ICD10-I70.0). These results were called by telephone at the time of interpretation on 05/12/2021 at 2:30 pm to Dr. Doren Custard, who verbally acknowledged these results. Electronically Signed   By: Logan Bores M.D.   On: 05/12/2021 14:39   US BIOPSY (KIDNEY)  Result Date: 05/08/2021 INDICATION: Unspecified proteinuria EXAM: ULTRASOUND LEFT RENAL RANDOM CORE BIOPSY MEDICATIONS: 1% LIDOCAINE LOCAL ANESTHESIA/SEDATION: Moderate (conscious) sedation was employed during this procedure. A total of Versed 1.5 mg and Fentanyl 50 mcg was administered intravenously by the radiology nurse. Total intra-service moderate Sedation Time: 20 minutes. The patient's level of consciousness and vital signs were monitored continuously by radiology nursing throughout the procedure under my direct supervision. COMPLICATIONS: None immediate. PROCEDURE: Informed written consent was obtained from the patient after a thorough discussion of the procedural risks, benefits and alternatives. All questions were addressed.  Maximal Sterile Barrier Technique was utilized including caps, mask, sterile gowns, sterile gloves, sterile drape, hand hygiene and skin antiseptic. A timeout was performed prior to the initiation of the procedure. previous imaging reviewed. patient position prone. left kidney lower pole was localized and marked for biopsy. under sterile conditions and local anesthesia, the 17 gauge coaxial guide was advanced to the left kidney lower pole cortex. needle position confirmed with ultrasound. 2 18 gauge core biopsies obtained. samples were intact and non fragmented.  these were placed in saline. needle tract occluded with gel-foam. postprocedure imaging demonstrates no hemorrhage or hematoma. patient tolerated biopsy well. IMPRESSION: Successful ultrasound-guided left kidney core biopsy Electronically Signed   By: Jerilynn Mages.  Shick M.D.   On: 05/08/2021 12:54   VAS [place] UPPER EXT VEIN MAPPING (PRE-OP AVF)  Result Date: 05/16/2021 UPPER EXTREMITY VEIN MAPPING Patient Name:  SANCHEZ Panepinto  Date of Exam:   05/16/2021 Medical Rec #: [id number]    Accession #:    [id number] Date of Birth: [birth year]    Patient Gender: M Patient Age:   78 years Exam Location:  [facility] Procedure:      VAS [place] UPPER EXT VEIN MAPPING (PRE-OP AVF) Referring Phys: Harrie Jeans --------------------------------------------------------------------------------  Indications: Pre-access. Performing Technologist: Rogelia Rohrer RVT, RDMS  Examination Guidelines: A complete evaluation includes B-mode imaging, spectral Doppler, color Doppler, and power Doppler as needed of all accessible portions of each vessel. Bilateral testing is considered an integral part of a complete examination. Limited examinations for reoccurring indications may be performed as noted. +-----------------+-------------+----------+---------+ Right Cephalic   Diameter (cm)Depth (cm)Findings  +-----------------+-------------+----------+---------+ Shoulder             0.25        0.57             +-----------------+-------------+----------+---------+ Prox upper arm       0.29        0.45             +-----------------+-------------+----------+---------+ Mid upper arm        0.28        0.32             +-----------------+-------------+----------+---------+ Dist upper arm       0.20        0.33   branching +-----------------+-------------+----------+---------+ Antecubital fossa    0.24        0.30   branching +-----------------+-------------+----------+---------+ Prox forearm         0.34        0.64   branching  +-----------------+-------------+----------+---------+ Mid forearm          0.20        0.22             +-----------------+-------------+----------+---------+ Dist forearm         0.22        0.24             +-----------------+-------------+----------+---------+ Wrist                0.19        0.28             +-----------------+-------------+----------+---------+ +-----------------+-------------+----------+---------+ Right Basilic    Diameter (cm)Depth (cm)Findings  +-----------------+-------------+----------+---------+ Prox upper arm       0.36                         +-----------------+-------------+----------+---------+ Mid upper arm        0.31                         +-----------------+-------------+----------+---------+  Dist upper arm       0.25               branching +-----------------+-------------+----------+---------+ Antecubital fossa    0.23                         +-----------------+-------------+----------+---------+ Prox forearm         0.21                         +-----------------+-------------+----------+---------+ Mid forearm          0.14                         +-----------------+-------------+----------+---------+ Distal forearm       0.20                         +-----------------+-------------+----------+---------+ Wrist                0.15                         +-----------------+-------------+----------+---------+ +-----------------+-------------+----------+-------------------------------+ Left Cephalic    Diameter (cm)Depth (cm)           Findings             +-----------------+-------------+----------+-------------------------------+ Shoulder             0.34        0.38                                   +-----------------+-------------+----------+-------------------------------+ Prox upper arm       0.25        0.32                                    +-----------------+-------------+----------+-------------------------------+ Mid upper arm        0.25        0.29                                   +-----------------+-------------+----------+-------------------------------+ Dist upper arm       0.31        0.33                                   +-----------------+-------------+----------+-------------------------------+ Antecubital fossa    0.28        0.19                                   +-----------------+-------------+----------+-------------------------------+ Prox forearm                            not visualized and IV placement +-----------------+-------------+----------+-------------------------------+ Mid forearm          0.16        0.31              branching            +-----------------+-------------+----------+-------------------------------+ Dist forearm         0.16  0.21                                   +-----------------+-------------+----------+-------------------------------+ Wrist                0.15        0.23                                   +-----------------+-------------+----------+-------------------------------+ +-----------------+-------------+----------+---------+ Left Basilic     Diameter (cm)Depth (cm)Findings  +-----------------+-------------+----------+---------+ Prox upper arm       0.42                         +-----------------+-------------+----------+---------+ Mid upper arm        0.35                         +-----------------+-------------+----------+---------+ Dist upper arm       0.37                         +-----------------+-------------+----------+---------+ Antecubital fossa    0.32               branching +-----------------+-------------+----------+---------+ Prox forearm         0.22                         +-----------------+-------------+----------+---------+ Mid forearm          0.21                          +-----------------+-------------+----------+---------+ Distal forearm       0.22                         +-----------------+-------------+----------+---------+ Wrist                0.15               branching +-----------------+-------------+----------+---------+ *See table(s) above for measurements and observations.  Diagnosing physician: Monica Martinez MD Electronically signed by Monica Martinez MD on 05/16/2021 at 1:21:25 PM.    Final     Assessment/Plan  1. Retroperitoneal hematoma -  complication post renal biopsy  -  CT of abdomen and pelvis showed no evidence of a potential intervenable target embolization  2. Acute blood loss anemia -   hgb dropped to 6.9, S/P transfusion of 2 units PRBC  3. ESRD on hemodialysis ([place]) -  has a tunneled dialysis catheter and hemodialysis on MWFs  4. Type 2 diabetes mellitus with stage 5 chronic kidney disease not on chronic dialysis, with long-term current use of insulin (HCC) Lab Results  Component Value Date   HGBA1C 11.9 (A) 01/27/2021   -  continue Insulin Glargine 16 units SQ Q HS  5. Essential hypertension -  continue Amlodipine 10 mg daily and Carvedilol 6.25 mg BID -  cardiology recommended adding Hydralazine if BPs will allow  6. Ischemic cardiomyopathy -  cardiology was consulted and thought that it was due to acute blood loss anemia -  continue ASA, Rosuvastatin, Zetia, Coreg, Imdur -  follow up with cardiology    Family/ staff Communication: Discussed plan of care with resident and charge nurse.  Labs/tests ordered:  CBC  in 1 week    Durenda Age, DNP, MSN, FNP-BC [place] [phone] (Monday-Friday 8:00 a.m. - 5:00 p.m.) [phone] (after hours)

## 2021-05-22 ENCOUNTER — Non-Acute Institutional Stay (SKILLED_NURSING_FACILITY): Payer: Medicare Other | Admitting: Internal Medicine

## 2021-05-22 ENCOUNTER — Encounter: Payer: Self-pay | Admitting: Internal Medicine

## 2021-05-22 DIAGNOSIS — K661 Hemoperitoneum: Secondary | ICD-10-CM | POA: Diagnosis not present

## 2021-05-22 DIAGNOSIS — E1122 Type 2 diabetes mellitus with diabetic chronic kidney disease: Secondary | ICD-10-CM | POA: Insufficient documentation

## 2021-05-22 DIAGNOSIS — I5189 Other ill-defined heart diseases: Secondary | ICD-10-CM | POA: Diagnosis not present

## 2021-05-22 DIAGNOSIS — I214 Non-ST elevation (NSTEMI) myocardial infarction: Secondary | ICD-10-CM | POA: Insufficient documentation

## 2021-05-22 DIAGNOSIS — G3184 Mild cognitive impairment, so stated: Secondary | ICD-10-CM

## 2021-05-22 DIAGNOSIS — N186 End stage renal disease: Secondary | ICD-10-CM

## 2021-05-22 NOTE — Assessment & Plan Note (Addendum)
Current A1c is 11.9% indicating extremely poorly controlled diabetes.  Patient lacks comprehension as to his role in diabetic control and complication prevention.

## 2021-05-22 NOTE — Assessment & Plan Note (Signed)
CHF clinically compensated; no NVD or peripheral edema. No change in present cardiac regimen indicated.  

## 2021-05-22 NOTE — Assessment & Plan Note (Addendum)
He has little or no comprehension of the events of the hospitalization 5/9 - 05/20/2021.  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 SLUMS will be considered based on ongoing neurocognitive assessment here at the SNF.

## 2021-05-22 NOTE — Assessment & Plan Note (Signed)
Cardiology to follow-up post SNF rehab for ischemic evaluation as an outpatient.  He has no anginal or anginal equivalent symptoms at this time.

## 2021-05-22 NOTE — Patient Instructions (Signed)
See assessment and plan under each diagnosis in the problem list and acutely for this visit 

## 2021-05-22 NOTE — Progress Notes (Signed)
NURSING HOME LOCATION:  [facility] ROOM NUMBER:  [id number]  CODE STATUS:  Full  PCP:  Gaye Pollack MD  This is a comprehensive admission note to this SNFperformed on this date less than 30 days from date of admission. Included are preadmission medical/surgical history; reconciled medication list; family history; social history and comprehensive review of systems.  Corrections and additions to the records were documented. Comprehensive physical exam was also performed. Additionally a clinical summary was entered for each active diagnosis pertinent to this admission in the Problem List to enhance continuity of care.  HPI: Patient was hospitalized 5/9 - 05/20/2021, presenting with left lower quadrant abdominal pain , flank pain , hematuria with dizziness after renal biopsy performed 4 days PTA on 5/5.  Admission H/H was 6.9/20.9.  Imaging revealed a large retroperitoneal hematoma.  CT of the abdomen/pelvis revealed no evidence of a potentially interventional target for embolization.  He received 2 units of packed red cells with stabilization of hemoglobin.  Final H/H was 11.3/36.4. He has ESRD with a presentation creatinine of 8.05 and GFR of 7.  During hospitalization there was an improvement progressively with a final creatinine of 4.12 and GFR of 16, indicating low stage IV CKD.  CKD was attributed to poorly controlled diabetes manifested by an A1c of 11.9% and hypertension.  IR performed a tunneled HD cath placement 5/10 allowing HD while hospitalized.  AVF placement by vascular surgery has been postponed until early June because of clinical type II non-STEMI in context of acute blood loss anemia .EKG revealed deep ST depressions and T wave inversions in the inferolateral leads.  Troponins were elevated with a peak of 1743.  He had no angina or anginal equivalents such as dyspnea and no associated cardiac decompensation.    Echo did however reveal EF of 30-35% with  moderately decreased function LV.  Wall motion was difficult to assess although basal to mid septal segments appear to be hypokinetic as well as all anterior LV segments, findings consistent with grade 2 diastolic dysfunction.  Right ventricular systolic function was mildly reduced with mildly elevated PA systolic pressure.  Mild-moderate mitral valve regurgitation was present.  Cardiology consulted and recommended deferral of ischemic evaluation as an outpatient.  81 mg aspirin prophylaxis was continued along with Coreg, Zetia and rosuvastatin.  He also started on low-dose Imdur.  Serially blood pressures were in the 924Q-683M systolically.  Hydralazine was to be added if blood pressure sufficiently elevated.  MRA/SGLT2/ARNI were avoided because of the ESRD. Insulin was reduced to 16 units daily at bedtime because of isolated low a.m. glucoses. Because of debilitation he was discharged to the SNF for rehab.  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 Surgeries and procedures include multiple toe amputations as well as right BKA.  He is also had angioplasty with stenting prior to amputations.  Social history:non drinker; active smoker PTA.  Family history:limited hx reviewed   Review of systems: Clinical neurocognitive deficits made validity of responses questionable .  He states he was not informed as to what transpired in the hospital, stating "didn't tell me nothing".  He stated that he was admitted because of severe pain in the left flank after the biopsy.  The pain was radiating to the abdomen.  The pain was impacted by change in position at night "if I sleep wrong".  Pain is improved but still present intermittently.  When I asked about when HD was to be initiated he was unclear.  He made the comment "my memory not good since thing done to my back".  He  also describes being dizzy and lightheaded. He was not monitoring glucoses at home.  He states he was not following any diet but "trying to eat protein".  He denies oliguria.  He has numbness and tingling in the left foot as well as the stump of the BKA.  He denies angina or angina equivalents.  Constitutional: No fever, significant weight change, fatigue  Eyes: No redness, discharge, pain, vision change ENT/mouth: No nasal congestion, purulent discharge, earache, change in hearing, sore throat  Cardiovascular: No chest pain, palpitations, paroxysmal nocturnal dyspnea, claudication, edema  Respiratory: No cough, sputum production, hemoptysis, DOE, significant snoring, apnea  Gastrointestinal: No heartburn, dysphagia,  nausea /vomiting, rectal bleeding, melena, change in bowels Genitourinary: No dysuria, hematuria, pyuria, incontinence, nocturia Dermatologic: No rash, pruritus, change in appearance of skin Neurologic: No headache, syncope, seizures Psychiatric: No significant anxiety, depression, insomnia, anorexia Endocrine: No change in hair/skin/nails, excessive thirst, excessive hunger, excessive urination  Hematologic/lymphatic: No significant bruising, lymphadenopathy, abnormal bleeding  Physical exam:  Pertinent or positive findings: He appears older than his age and suboptimally nourished.  Hair is disheveled.  He has pattern alopecia.  He has a beard and mustache which are unkempt.  Eyebrows are decreased laterally.  He exhibits intermittent mastication type maneuvers of the mandible.  There is erosion to and below the gumline of all teeth.  Second heart sound is increased.  He has minor low-grade rales.  There is ill-defined fullness in the left lower quadrant.  Pedal pulses are not palpable in the left foot.  All toes are amputated on the left.  He has right BKA.  There is partial flexion of the third fingers of each hand.  He wears gloves with the fingers cut out stating that this helps  improve his grip.  General appearance: no acute distress, increased work of breathing is present.   Lymphatic: No lymphadenopathy about the head, neck, axilla. Eyes: No conjunctival inflammation or lid edema is present. There is no scleral icterus. Ears:  External ear exam shows no significant lesions or deformities.   Nose:  External nasal examination shows no deformity or inflammation. Nasal mucosa are pink and moist without lesions, exudates Neck:  No thyromegaly, masses, tenderness noted.    Heart:  Normal rate and regular rhythm. S1 normal without gallop, murmur, click, rub.  Lungs:  without wheezes, rhonchi,  rubs. Abdomen: Bowel sounds are normal.  Abdomen is soft and nontender with no organomegaly, hernias, masses. GU: Deferred  Extremities:  No cyanosis, clubbing, edema. Neurologic exam: Balance, Rhomberg, finger to nose testing could not be completed due to clinical state Skin: Warm & dry w/o tenting. No significant lesions or rash.  See clinical summary under each active problem in the Problem List with associated updated therapeutic plan

## 2021-05-22 NOTE — Assessment & Plan Note (Signed)
Presently no active bleeding dyscrasias.  CBC will be monitored at the SNF.

## 2021-05-28 ENCOUNTER — Encounter: Payer: Medicare Other | Admitting: Student

## 2021-05-28 ENCOUNTER — Other Ambulatory Visit: Payer: Self-pay

## 2021-05-28 DIAGNOSIS — E1122 Type 2 diabetes mellitus with diabetic chronic kidney disease: Secondary | ICD-10-CM

## 2021-06-01 ENCOUNTER — Encounter: Payer: Self-pay | Admitting: Physician Assistant
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
## 2021-06-02 ENCOUNTER — Encounter: Payer: Self-pay | Admitting: Adult Health

## 2021-06-02 ENCOUNTER — Non-Acute Institutional Stay (SKILLED_NURSING_FACILITY): Payer: Medicare Other | Admitting: Adult Health

## 2021-06-02 DIAGNOSIS — I10 Essential (primary) hypertension: Secondary | ICD-10-CM

## 2021-06-02 DIAGNOSIS — Z992 Dependence on renal dialysis: Secondary | ICD-10-CM

## 2021-06-02 DIAGNOSIS — D62 Acute posthemorrhagic anemia: Secondary | ICD-10-CM

## 2021-06-02 DIAGNOSIS — K661 Hemoperitoneum: Secondary | ICD-10-CM | POA: Diagnosis not present

## 2021-06-02 DIAGNOSIS — N186 End stage renal disease: Secondary | ICD-10-CM

## 2021-06-02 DIAGNOSIS — K219 Gastro-esophageal reflux disease without esophagitis: Secondary | ICD-10-CM

## 2021-06-02 DIAGNOSIS — Z720 Tobacco use: Secondary | ICD-10-CM

## 2021-06-02 DIAGNOSIS — I214 Non-ST elevation (NSTEMI) myocardial infarction: Secondary | ICD-10-CM

## 2021-06-02 DIAGNOSIS — E1122 Type 2 diabetes mellitus with diabetic chronic kidney disease: Secondary | ICD-10-CM

## 2021-06-02 DIAGNOSIS — E1142 Type 2 diabetes mellitus with diabetic polyneuropathy: Secondary | ICD-10-CM

## 2021-06-02 NOTE — Progress Notes (Unsigned)
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 anterior LV segments and the basal-to-mid septal segments appear hypokinetic. There is mild concentric left ventricular hypertrophy. Left ventricular diastolic parameters are consistent with Grade II diastolic dysfunction (pseudonormalization).  2. Right ventricular systolic function is moderately reduced. The right ventricular size is mildly enlarged. There is   mildly elevated pulmonary artery systolic pressure. The estimated right ventricular systolic pressure is 43.5 mmHg.  3. The mitral valve is abnormal. Mild to moderate mitral valve regurgitation.  4. The aortic valve is tricuspid. There is mild calcification of the aortic valve. There is mild thickening of the aortic valve. Aortic valve regurgitation is not visualized. Aortic valve sclerosis/calcification is present, without any evidence of aortic stenosis.  5. The inferior vena cava is dilated in size with <50% respiratory variability, suggesting right atrial pressure of 15 mmHg.  6. Findings concerning for biventricular dysfunction with moderately reduced LV and RV systolic function as well as  wall motion abnormalities suggestive of possible LAD disease. Comparison(s): No prior Echocardiogram. FINDINGS  Left Ventricle: Left ventricular ejection fraction, by estimation, is 30 to 35%. The left ventricle has moderately decreased function. The left ventricle demonstrates regional wall motion abnormalities. The left ventricular internal cavity size was normal in size. Wall motion difficult to assess due to incomplete visualization on the LV endocardium. Based on limited views, all anterior LV segments and the basal-to-mid septal segments appear hypokinetic. There is mild concentric left ventricular hypertrophy. Left ventricular diastolic parameters are consistent with Grade II diastolic dysfunction (pseudonormalization). Right Ventricle: The right ventricular size is mildly enlarged. Right ventricular systolic function is moderately reduced. There is mildly elevated pulmonary artery systolic pressure. The tricuspid regurgitant velocity is 2.67 m/s, and with an assumed right atrial pressure of 15 mmHg, the estimated right ventricular systolic pressure is 43.5 mmHg. Right Atrium: Right atrial size was normal in size. Pericardium: There is no evidence of pericardial effusion. Mitral Valve: The mitral valve is abnormal. Mild to moderate mitral valve regurgitation. Tricuspid Valve: The tricuspid valve is normal in structure. Tricuspid valve regurgitation is mild. Aortic Valve: The aortic valve is tricuspid. There is mild calcification of the aortic valve. There is mild thickening of the aortic valve. Aortic valve regurgitation is not visualized. Aortic valve sclerosis/calcification is present, without any evidence of aortic stenosis. Aortic valve peak gradient measures 4.8 mmHg. Pulmonic Valve: The pulmonic valve was normal in structure. Pulmonic valve regurgitation is trivial. Aorta: The aortic root and ascending aorta are structurally normal, with no evidence of dilitation. Venous: The inferior vena cava is  dilated in size with less than 50% respiratory variability, suggesting right atrial pressure of 15 mmHg. LEFT VENTRICLE PLAX 2D LVIDd:         5.10 cm      Diastology LVIDs:         4.00 cm      LV e' medial:    3.98 cm/s LV PW:         1.20 cm      LV E/e' medial:  24.0 LV IVS:        1.00 cm      LV e' lateral:   6.60 cm/s LVOT diam:     2.00 cm      LV E/e' lateral: 14.5 LV SV:         34 LV SV Index:   17 LVOT Area:     3.14 cm  LV Volumes (MOD) LV vol d, MOD A2C: 132.0 ml LV vol d, MOD A4C: 139.0 ml LV vol s, MOD A2C: 71.4 ml LV vol s, MOD A4C: 74.4 ml LV SV MOD A2C:     60.6 ml LV SV MOD A4C:     139.0 ml LV SV MOD BP:      62.6 ml RIGHT VENTRICLE              IVC RV Basal diam:  3.30 cm    IVC diam: 2.00 cm RV S prime:     5.70 cm/s TAPSE (M-mode): 1.4 cm LEFT ATRIUM             Index        RIGHT ATRIUM           Index LA diam:        3.60 cm 1.79 cm/m   RA Area:     15.40 cm LA Vol (A2C):   41.9 ml 20.83 ml/m  RA Volume:   39.80 ml  19.79 ml/m LA Vol (A4C):   47.7 ml 23.71 ml/m LA Biplane Vol: 45.7 ml 22.72 ml/m  AORTIC VALVE AV Area (Vmax): 1.98 cm AV Vmax:        110.00 cm/s AV Peak Grad:   4.8 mmHg LVOT Vmax:      69.40 cm/s LVOT Vmean:     43.800 cm/s LVOT VTI:       0.108 m  AORTA Ao Root diam: 2.70 cm Ao Asc diam:  3.10 cm MITRAL VALVE               TRICUSPID VALVE MV Area (PHT): 6.54 cm    TR Peak grad:   28.5 mmHg MV Decel Time: 116 msec    TR Vmax:        267.00 cm/s MR Peak grad: 99.6 mmHg MR Mean grad: 66.0 mmHg    SHUNTS MR Vmax:      499.00 cm/s  Systemic VTI:  0.11 m MR Vmean:     397.0 cm/s   Systemic Diam: 2.00 cm MV E velocity: 95.70 cm/s MV A velocity: 50.20 cm/s MV E/A ratio:  1.91 Heather Pemberton MD Electronically signed by Heather Pemberton MD Signature Date/Time: 05/13/2021/12:21:58 PM    Final    CT Renal Stone Study  Result Date: 05/12/2021 CLINICAL DATA:  Hemorrhage following left renal biopsy on 05/08/2021. Continued pain. EXAM: CT ABDOMEN AND PELVIS WITHOUT CONTRAST  TECHNIQUE: Multidetector CT imaging of the abdomen and pelvis was performed following the standard protocol without IV contrast. RADIATION DOSE REDUCTION: This exam was performed according to the departmental dose-optimization program which includes automated exposure control, adjustment of the mA and/or kV according to patient size and/or use of iterative reconstruction technique. COMPARISON:  CT abdomen 05/08/2021 FINDINGS: Lower chest: Mild scarring and/or atelectasis in the lung bases. No pleural effusion. Coronary atherosclerosis. Decreased attenuation of the blood pool compatible with anemia. Hepatobiliary: Punctate liver calcifications. Collapsed gallbladder. No biliary dilatation. Pancreas: Unremarkable. Spleen: Unremarkable. Adrenals/Urinary Tract: Unremarkable adrenal glands. Right renal atrophy. There is a persistent subcapsular hematoma along the lateral aspect of the left kidney which is overall similar in size to the prior CT, however there is a large retroperitoneal hematoma extending from the lower pole of the kidney inferiorly into the pelvis measuring approximately 10.7 x 8.3 x 18.1 cm (transverse x AP x craniocaudal) which is largely new. No hydronephrosis. Mild diffuse bladder wall thickening. Stomach/Bowel: The stomach is unremarkable. There is no evidence of bowel obstruction. The appendix is unremarkable. Vascular/Lymphatic: Abdominal aortic atherosclerosis without aneurysm. No enlarged lymph nodes. Reproductive: Mildly enlarged prostate. Other: No ascites or pneumoperitoneum. Musculoskeletal: No acute osseous abnormality or suspicious osseous lesion. IMPRESSION: 1. New large retroperitoneal hematoma extending from the lower pole of the left kidney inferiorly into the pelvis. 2. Left renal subcapsular hematoma, similar in size to the prior CT. 3. Diffuse bladder wall thickening, correlate for cystitis. 4. Aortic Atherosclerosis (  ICD10-I70.0). These results were called by telephone at the time  of interpretation on 05/12/2021 at 2:30 pm to Dr. Dixon, who verbally acknowledged these results. Electronically Signed   By: Allen  Grady M.D.   On: 05/12/2021 14:39   US BIOPSY (KIDNEY)  Result Date: 05/08/2021 INDICATION: Unspecified proteinuria EXAM: ULTRASOUND LEFT RENAL RANDOM CORE BIOPSY MEDICATIONS: 1% LIDOCAINE LOCAL ANESTHESIA/SEDATION: Moderate (conscious) sedation was employed during this procedure. A total of Versed 1.5 mg and Fentanyl 50 mcg was administered intravenously by the radiology nurse. Total intra-service moderate Sedation Time: 20 minutes. The patient's level of consciousness and vital signs were monitored continuously by radiology nursing throughout the procedure under my direct supervision. COMPLICATIONS: None immediate. PROCEDURE: Informed written consent was obtained from the patient after a thorough discussion of the procedural risks, benefits and alternatives. All questions were addressed. Maximal Sterile Barrier Technique was utilized including caps, mask, sterile gowns, sterile gloves, sterile drape, hand hygiene and skin antiseptic. A timeout was performed prior to the initiation of the procedure. previous imaging reviewed. patient position prone. left kidney lower pole was localized and marked for biopsy. under sterile conditions and local anesthesia, the 17 gauge coaxial guide was advanced to the left kidney lower pole cortex. needle position confirmed with ultrasound. 2 18 gauge core biopsies obtained. samples were intact and non fragmented. these were placed in saline. needle tract occluded with gel-foam. postprocedure imaging demonstrates no hemorrhage or hematoma. patient tolerated biopsy well. IMPRESSION: Successful ultrasound-guided left kidney core biopsy Electronically Signed   By: M.  Shick M.D.   On: 05/08/2021 12:54   VAS US UPPER EXT VEIN MAPPING (PRE-OP AVF)  Result Date: 05/16/2021 UPPER EXTREMITY VEIN MAPPING Patient Name:  [name]  Date of Exam:    05/16/2021 Medical Rec #: [id number]    Accession #:    [id number] Date of Birth: 04/11/1960    Patient Gender: M Patient Age:   61 years Exam Location:  [facility] Procedure:      VAS US UPPER EXT VEIN MAPPING (PRE-OP AVF) Referring Phys: LORI FOSTER --------------------------------------------------------------------------------  Indications: Pre-access. Performing Technologist: Jody Hill RVT, RDMS  Examination Guidelines: A complete evaluation includes B-mode imaging, spectral Doppler, color Doppler, and power Doppler as needed of all accessible portions of each vessel. Bilateral testing is considered an integral part of a complete examination. Limited examinations for reoccurring indications may be performed as noted. +-----------------+-------------+----------+---------+ Right Cephalic   Diameter (cm)Depth (cm)Findings  +-----------------+-------------+----------+---------+ Shoulder             0.25        0.57             +-----------------+-------------+----------+---------+ Prox upper arm       0.29        0.45             +-----------------+-------------+----------+---------+ Mid upper arm        0.28        0.32             +-----------------+-------------+----------+---------+ Dist upper arm       0.20        0.33   branching +-----------------+-------------+----------+---------+ Antecubital fossa    0.24        0.30   branching +-----------------+-------------+----------+---------+ Prox forearm         0.34        0.64   branching +-----------------+-------------+----------+---------+ Mid forearm          0.20          0.22             +-----------------+-------------+----------+---------+ Dist forearm         0.22        0.24             +-----------------+-------------+----------+---------+ Wrist                0.19        0.28             +-----------------+-------------+----------+---------+ +-----------------+-------------+----------+---------+  Right Basilic    Diameter (cm)Depth (cm)Findings  +-----------------+-------------+----------+---------+ Prox upper arm       0.36                         +-----------------+-------------+----------+---------+ Mid upper arm        0.31                         +-----------------+-------------+----------+---------+ Dist upper arm       0.25               branching +-----------------+-------------+----------+---------+ Antecubital fossa    0.23                         +-----------------+-------------+----------+---------+ Prox forearm         0.21                         +-----------------+-------------+----------+---------+ Mid forearm          0.14                         +-----------------+-------------+----------+---------+ Distal forearm       0.20                         +-----------------+-------------+----------+---------+ Wrist                0.15                         +-----------------+-------------+----------+---------+ +-----------------+-------------+----------+-------------------------------+ Left Cephalic    Diameter (cm)Depth (cm)           Findings             +-----------------+-------------+----------+-------------------------------+ Shoulder             0.34        0.38                                   +-----------------+-------------+----------+-------------------------------+ Prox upper arm       0.25        0.32                                   +-----------------+-------------+----------+-------------------------------+ Mid upper arm        0.25        0.29                                   +-----------------+-------------+----------+-------------------------------+ Dist upper arm       0.31        0.33                                   +-----------------+-------------+----------+-------------------------------+  Antecubital fossa    0.28        0.19                                    +-----------------+-------------+----------+-------------------------------+ Prox forearm                            not visualized and IV placement +-----------------+-------------+----------+-------------------------------+ Mid forearm          0.16        0.31              branching            +-----------------+-------------+----------+-------------------------------+ Dist forearm         0.16        0.21                                   +-----------------+-------------+----------+-------------------------------+ Wrist                0.15        0.23                                   +-----------------+-------------+----------+-------------------------------+ +-----------------+-------------+----------+---------+ Left Basilic     Diameter (cm)Depth (cm)Findings  +-----------------+-------------+----------+---------+ Prox upper arm       0.42                         +-----------------+-------------+----------+---------+ Mid upper arm        0.35                         +-----------------+-------------+----------+---------+ Dist upper arm       0.37                         +-----------------+-------------+----------+---------+ Antecubital fossa    0.32               branching +-----------------+-------------+----------+---------+ Prox forearm         0.22                         +-----------------+-------------+----------+---------+ Mid forearm          0.21                         +-----------------+-------------+----------+---------+ Distal forearm       0.22                         +-----------------+-------------+----------+---------+ Wrist                0.15               branching +-----------------+-------------+----------+---------+ *See table(s) above for measurements and observations.  Diagnosing physician: Monica Martinez MD Electronically signed by Monica Martinez MD on 05/16/2021 at 1:21:25 PM.    Final      Assessment/Plan ***   Family/ staff Communication: Discussed plan of care with resident and charge nurse  Labs/tests ordered:     Durenda Age, DNP, MSN, FNP-BC [facility] [phone] (Monday-Friday 8:00 a.m. - 5:00 p.m.) [phone] (  after hours)  

## 2021-06-03 ENCOUNTER — Ambulatory Visit: Payer: Medicare Other | Admitting: Physician Assistant

## 2021-06-03 DIAGNOSIS — E785 Hyperlipidemia, unspecified: Secondary | ICD-10-CM

## 2021-06-03 DIAGNOSIS — I214 Non-ST elevation (NSTEMI) myocardial infarction: Secondary | ICD-10-CM

## 2021-06-03 DIAGNOSIS — I5022 Chronic systolic (congestive) heart failure: Secondary | ICD-10-CM

## 2021-06-03 DIAGNOSIS — I2510 Atherosclerotic heart disease of native coronary artery without angina pectoris: Secondary | ICD-10-CM

## 2021-06-03 DIAGNOSIS — I739 Peripheral vascular disease, unspecified: Secondary | ICD-10-CM

## 2021-06-03 DIAGNOSIS — I10 Essential (primary) hypertension: Secondary | ICD-10-CM

## 2021-06-03 DIAGNOSIS — I340 Nonrheumatic mitral (valve) insufficiency: Secondary | ICD-10-CM

## 2021-06-03 MED ORDER — DULOXETINE HCL 30 MG PO CPEP
60.0000 mg | ORAL_CAPSULE | Freq: Every day | ORAL | 0 refills | Status: DC
  Filled 2021-06-03: qty 60, 30d supply, fill #0

## 2021-06-03 MED ORDER — ISOSORBIDE MONONITRATE ER 30 MG PO TB24
15.0000 mg | ORAL_TABLET | Freq: Every day | ORAL | 0 refills | Status: DC
  Filled 2021-06-03: qty 15, 30d supply, fill #0

## 2021-06-03 MED ORDER — ROSUVASTATIN CALCIUM 10 MG PO TABS
10.0000 mg | ORAL_TABLET | Freq: Every day | ORAL | 0 refills | Status: DC
  Filled 2021-06-03: qty 30, 30d supply, fill #0

## 2021-06-03 MED ORDER — AMLODIPINE BESYLATE 10 MG PO TABS
10.0000 mg | ORAL_TABLET | Freq: Every day | ORAL | 0 refills | Status: DC
  Filled 2021-06-03: qty 30, 30d supply, fill #0

## 2021-06-03 MED ORDER — ASPIRIN 81 MG PO TBEC
81.0000 mg | DELAYED_RELEASE_TABLET | Freq: Every day | ORAL | 0 refills | Status: DC
Start: 2021-06-03 — End: 2022-12-22

## 2021-06-03 MED ORDER — EZETIMIBE 10 MG PO TABS
10.0000 mg | ORAL_TABLET | Freq: Every day | ORAL | 0 refills | Status: DC
  Filled 2021-06-03: qty 30, 30d supply, fill #0

## 2021-06-03 MED ORDER — PANTOPRAZOLE SODIUM 40 MG PO TBEC
40.0000 mg | DELAYED_RELEASE_TABLET | Freq: Every day | ORAL | 0 refills | Status: DC
  Filled 2021-06-03: qty 30, 30d supply, fill #0

## 2021-06-03 MED ORDER — FLUTICASONE PROPIONATE 50 MCG/ACT NA SUSP
1.0000 | Freq: Every day | NASAL | 0 refills | Status: DC
  Filled 2021-06-03: qty 16, 30d supply, fill #0

## 2021-06-03 MED ORDER — CARVEDILOL 6.25 MG PO TABS
6.2500 mg | ORAL_TABLET | Freq: Two times a day (BID) | ORAL | 0 refills | Status: DC
  Filled 2021-06-03: qty 60, 30d supply, fill #0

## 2021-06-03 MED ORDER — INSULIN GLARGINE-YFGN 100 UNIT/ML ~~LOC~~ SOLN
16.0000 [IU] | Freq: Every day | SUBCUTANEOUS | 0 refills | Status: DC
  Filled 2021-06-03: qty 10, 62d supply, fill #0

## 2021-06-03 MED ORDER — NICOTINE 14 MG/24HR TD PT24
14.0000 mg | MEDICATED_PATCH | Freq: Every day | TRANSDERMAL | 0 refills | Status: DC
  Filled 2021-06-03: qty 28, 28d supply, fill #0

## 2021-06-04 ENCOUNTER — Other Ambulatory Visit: Payer: Self-pay | Admitting: Adult Health

## 2021-06-04 ENCOUNTER — Other Ambulatory Visit (HOSPITAL_COMMUNITY): Payer: Self-pay

## 2021-06-04 ENCOUNTER — Other Ambulatory Visit: Payer: Self-pay

## 2021-06-04 DIAGNOSIS — N3001 Acute cystitis with hematuria: Secondary | ICD-10-CM | POA: Diagnosis not present

## 2021-06-04 DIAGNOSIS — D62 Acute posthemorrhagic anemia: Secondary | ICD-10-CM | POA: Diagnosis not present

## 2021-06-04 DIAGNOSIS — R2681 Unsteadiness on feet: Secondary | ICD-10-CM | POA: Diagnosis not present

## 2021-06-04 DIAGNOSIS — E119 Type 2 diabetes mellitus without complications: Secondary | ICD-10-CM

## 2021-06-04 DIAGNOSIS — M6281 Muscle weakness (generalized): Secondary | ICD-10-CM | POA: Diagnosis not present

## 2021-06-04 DIAGNOSIS — E1122 Type 2 diabetes mellitus with diabetic chronic kidney disease: Secondary | ICD-10-CM

## 2021-06-04 DIAGNOSIS — K661 Hemoperitoneum: Secondary | ICD-10-CM | POA: Diagnosis not present

## 2021-06-04 DIAGNOSIS — I739 Peripheral vascular disease, unspecified: Secondary | ICD-10-CM | POA: Diagnosis not present

## 2021-06-04 DIAGNOSIS — N186 End stage renal disease: Secondary | ICD-10-CM | POA: Diagnosis not present

## 2021-06-04 DIAGNOSIS — R2689 Other abnormalities of gait and mobility: Secondary | ICD-10-CM | POA: Diagnosis not present

## 2021-06-04 MED ORDER — ACCU-CHEK FASTCLIX LANCETS MISC
Freq: Three times a day (TID) | 12 refills | Status: DC
  Filled 2021-06-04: qty 102, 34d supply, fill #0

## 2021-06-04 MED ORDER — INSULIN GLARGINE-YFGN 100 UNIT/ML ~~LOC~~ SOPN
16.0000 [IU] | PEN_INJECTOR | Freq: Every day | SUBCUTANEOUS | 0 refills | Status: DC
  Filled 2021-06-04: qty 3, 18d supply, fill #0

## 2021-06-04 MED ORDER — ACCU-CHEK AVIVA PLUS VI STRP
ORAL_STRIP | 12 refills | Status: DC
  Filled 2021-06-04: qty 100, 33d supply, fill #0

## 2021-06-04 MED ORDER — INSULIN PEN NEEDLE 32G X 4 MM MISC
Freq: Four times a day (QID) | 3 refills | Status: DC
  Filled 2021-06-04: qty 300, 75d supply, fill #0

## 2021-06-08 ENCOUNTER — Other Ambulatory Visit (HOSPITAL_COMMUNITY): Payer: Self-pay

## 2021-06-08 ENCOUNTER — Encounter (HOSPITAL_COMMUNITY): Payer: Medicare Other

## 2021-06-08 ENCOUNTER — Other Ambulatory Visit (HOSPITAL_COMMUNITY): Payer: Self-pay | Admitting: Surgery

## 2021-06-08 ENCOUNTER — Ambulatory Visit: Payer: Medicare Other | Admitting: Surgery

## 2021-06-08 DIAGNOSIS — N186 End stage renal disease: Secondary | ICD-10-CM

## 2021-06-08 MED ORDER — BLOOD GLUCOSE MONITOR SYSTEM W/DEVICE KIT
PACK | Freq: Three times a day (TID) | 0 refills | Status: DC
  Filled 2021-06-08: qty 1, 1d supply, fill #0

## 2021-06-08 MED ORDER — GLUCOSE BLOOD VI STRP
ORAL_STRIP | Freq: Three times a day (TID) | 99 refills | Status: DC
  Filled 2021-06-08: qty 100, 34d supply, fill #0

## 2021-06-08 MED ORDER — ACCU-CHEK SOFTCLIX LANCETS MISC
Freq: Three times a day (TID) | 0 refills | Status: DC
  Filled 2021-06-08: qty 100, 34d supply, fill #0

## 2021-06-09 ENCOUNTER — Other Ambulatory Visit (HOSPITAL_COMMUNITY): Payer: Self-pay

## 2021-06-09 ENCOUNTER — Encounter: Payer: Medicare Other | Admitting: Internal Medicine

## 2021-06-10 DIAGNOSIS — T8249XD Other complication of vascular dialysis catheter, subsequent encounter: Secondary | ICD-10-CM | POA: Diagnosis not present

## 2021-06-10 DIAGNOSIS — N186 End stage renal disease: Secondary | ICD-10-CM | POA: Diagnosis not present

## 2021-06-10 DIAGNOSIS — N2581 Secondary hyperparathyroidism of renal origin: Secondary | ICD-10-CM | POA: Diagnosis not present

## 2021-06-10 DIAGNOSIS — Z992 Dependence on renal dialysis: Secondary | ICD-10-CM | POA: Diagnosis not present

## 2021-06-11 ENCOUNTER — Telehealth: Payer: Self-pay

## 2021-06-11 ENCOUNTER — Other Ambulatory Visit (HOSPITAL_COMMUNITY): Payer: Self-pay

## 2021-06-11 NOTE — Telephone Encounter (Signed)
Requesting to speak with a nurse about getting refill on insulin, pt do not know the name. Please call pt back.

## 2021-06-11 NOTE — Telephone Encounter (Signed)
Return pt's call - stated he needs a refill on the insulin pen he takes 3 times a day; he thinks it might be Novolog - he's not at home at this time and cannot check the bottle. Not on current med list. ? discontinued at discharged on 5/17.  Please advise.  Thanks

## 2021-06-12 ENCOUNTER — Other Ambulatory Visit (HOSPITAL_COMMUNITY): Payer: Self-pay

## 2021-06-12 DIAGNOSIS — Z992 Dependence on renal dialysis: Secondary | ICD-10-CM | POA: Diagnosis not present

## 2021-06-12 DIAGNOSIS — N2581 Secondary hyperparathyroidism of renal origin: Secondary | ICD-10-CM | POA: Diagnosis not present

## 2021-06-12 DIAGNOSIS — T8249XD Other complication of vascular dialysis catheter, subsequent encounter: Secondary | ICD-10-CM | POA: Diagnosis not present

## 2021-06-12 DIAGNOSIS — N186 End stage renal disease: Secondary | ICD-10-CM | POA: Diagnosis not present

## 2021-06-12 MED ORDER — INSULIN GLARGINE-YFGN 100 UNIT/ML ~~LOC~~ SOPN
16.0000 [IU] | PEN_INJECTOR | Freq: Every day | SUBCUTANEOUS | 3 refills | Status: DC
  Filled 2021-06-12: qty 3, 18d supply, fill #0

## 2021-06-12 NOTE — Telephone Encounter (Signed)
Prescription sent in for Glargine

## 2021-06-15 ENCOUNTER — Other Ambulatory Visit (HOSPITAL_COMMUNITY): Payer: Self-pay

## 2021-06-15 ENCOUNTER — Ambulatory Visit (INDEPENDENT_AMBULATORY_CARE_PROVIDER_SITE_OTHER): Payer: Medicare Other | Admitting: Internal Medicine

## 2021-06-15 DIAGNOSIS — M79605 Pain in left leg: Secondary | ICD-10-CM | POA: Diagnosis not present

## 2021-06-15 DIAGNOSIS — Z794 Long term (current) use of insulin: Secondary | ICD-10-CM | POA: Diagnosis not present

## 2021-06-15 DIAGNOSIS — E1142 Type 2 diabetes mellitus with diabetic polyneuropathy: Secondary | ICD-10-CM

## 2021-06-15 MED ORDER — GABAPENTIN 300 MG PO CAPS
300.0000 mg | ORAL_CAPSULE | Freq: Every day | ORAL | 2 refills | Status: DC
  Filled 2021-06-15: qty 90, 90d supply, fill #0

## 2021-06-15 NOTE — Assessment & Plan Note (Addendum)
Acute visit today for left leg pain. 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 Last A1c was 11.9. Today, he complaints of sharp pins and needle pain under his left heel. Reports that is is burning sensation. 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 He states that the pain is present with activity as well as rest. Suspect that this is 2/2 uncontrolled peripheral neuropathy from duloxetine 60 mg qd alone, however his hx of left leg severe-moderate PAD (ABI in 10/2020 showed ABI 0.49) is concerning. Last seen by vascular surgery last October, at which time he was not having pain at rest, and he was instructed to return if he develops this or develops ulceration. We will add renal dosed Gabapentin 300 mg qd for neuropathic pain, and he is advised to follow up with vascular surgery at this time. He reports understanding and is appreciative. He is scheduled for an in-person f/u appt in 3 days.

## 2021-06-15 NOTE — Patient Instructions (Signed)
Please take Gabapentin 1 pill daily   Please call [phone] to schedule your vascular surgery appointment for your left leg.

## 2021-06-15 NOTE — Progress Notes (Signed)
   CC: acute tele-health visit for left leg pain   This is a telephone encounter between [name] and [name] on 06/15/2021 for stated above. The visit was conducted with the patient located at home and [name] at [name]. The patient's identity was confirmed using their DOB and current address. The patient has consented to being evaluated through a telephone encounter and understands the associated risks (an examination cannot be done and the patient may need to come in for an appointment) / benefits (allows the patient to remain at home, decreasing exposure to coronavirus). I personally spent 20 minutes on medical discussion.   HPI:  [name] is a 61 y.o. with PMH as below.   Please see A&P for assessment of the patient's acute and chronic medical conditions.   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  Patient discussed with Dr. Marzetta Board, MD Internal Medicine Resident, PGY-1 Internal Medicine Clinic

## 2021-06-16 ENCOUNTER — Other Ambulatory Visit (HOSPITAL_COMMUNITY): Payer: Self-pay

## 2021-06-17 ENCOUNTER — Other Ambulatory Visit (HOSPITAL_COMMUNITY): Payer: Self-pay

## 2021-06-17 DIAGNOSIS — N186 End stage renal disease: Secondary | ICD-10-CM | POA: Diagnosis not present

## 2021-06-17 DIAGNOSIS — N2581 Secondary hyperparathyroidism of renal origin: Secondary | ICD-10-CM | POA: Diagnosis not present

## 2021-06-17 DIAGNOSIS — T8249XD Other complication of vascular dialysis catheter, subsequent encounter: Secondary | ICD-10-CM | POA: Diagnosis not present

## 2021-06-17 DIAGNOSIS — Z992 Dependence on renal dialysis: Secondary | ICD-10-CM | POA: Diagnosis not present

## 2021-06-17 NOTE — Addendum Note (Signed)
Addended by: Aldine Contes on: 06/17/2021 01:01 PM   Modules accepted: Level of Service

## 2021-06-17 NOTE — Progress Notes (Signed)
Internal Medicine Clinic Attending  Case discussed with Dr. Patel  At the time of the visit.  We reviewed the resident's history and exam and pertinent patient test results.  I agree with the assessment, diagnosis, and plan of care documented in the resident's note.  

## 2021-06-18 ENCOUNTER — Other Ambulatory Visit (HOSPITAL_COMMUNITY): Payer: Self-pay

## 2021-06-18 ENCOUNTER — Encounter: Payer: Self-pay | Admitting: Student

## 2021-06-18 ENCOUNTER — Encounter: Payer: Medicare Other | Admitting: Internal Medicine

## 2021-06-18 ENCOUNTER — Other Ambulatory Visit: Payer: Self-pay

## 2021-06-23 ENCOUNTER — Telehealth: Payer: Self-pay

## 2021-06-23 DIAGNOSIS — E1140 Type 2 diabetes mellitus with diabetic neuropathy, unspecified: Secondary | ICD-10-CM | POA: Diagnosis not present

## 2021-06-23 DIAGNOSIS — N186 End stage renal disease: Secondary | ICD-10-CM | POA: Diagnosis not present

## 2021-06-23 DIAGNOSIS — I509 Heart failure, unspecified: Secondary | ICD-10-CM | POA: Diagnosis not present

## 2021-06-23 DIAGNOSIS — G3184 Mild cognitive impairment, so stated: Secondary | ICD-10-CM | POA: Diagnosis not present

## 2021-06-23 DIAGNOSIS — Z9181 History of falling: Secondary | ICD-10-CM | POA: Diagnosis not present

## 2021-06-23 DIAGNOSIS — D62 Acute posthemorrhagic anemia: Secondary | ICD-10-CM | POA: Diagnosis not present

## 2021-06-23 DIAGNOSIS — I110 Hypertensive heart disease with heart failure: Secondary | ICD-10-CM | POA: Diagnosis not present

## 2021-06-23 DIAGNOSIS — E1151 Type 2 diabetes mellitus with diabetic peripheral angiopathy without gangrene: Secondary | ICD-10-CM | POA: Diagnosis not present

## 2021-06-23 DIAGNOSIS — E785 Hyperlipidemia, unspecified: Secondary | ICD-10-CM | POA: Diagnosis not present

## 2021-06-23 DIAGNOSIS — I700 Atherosclerosis of aorta: Secondary | ICD-10-CM | POA: Diagnosis not present

## 2021-06-23 DIAGNOSIS — Z794 Long term (current) use of insulin: Secondary | ICD-10-CM | POA: Diagnosis not present

## 2021-06-23 DIAGNOSIS — K219 Gastro-esophageal reflux disease without esophagitis: Secondary | ICD-10-CM | POA: Diagnosis not present

## 2021-06-23 DIAGNOSIS — Z95828 Presence of other vascular implants and grafts: Secondary | ICD-10-CM | POA: Diagnosis not present

## 2021-06-23 DIAGNOSIS — I252 Old myocardial infarction: Secondary | ICD-10-CM | POA: Diagnosis not present

## 2021-06-23 DIAGNOSIS — Z89511 Acquired absence of right leg below knee: Secondary | ICD-10-CM | POA: Diagnosis not present

## 2021-06-23 DIAGNOSIS — F32A Depression, unspecified: Secondary | ICD-10-CM | POA: Diagnosis not present

## 2021-06-23 DIAGNOSIS — E1122 Type 2 diabetes mellitus with diabetic chronic kidney disease: Secondary | ICD-10-CM | POA: Diagnosis not present

## 2021-06-23 DIAGNOSIS — Z7982 Long term (current) use of aspirin: Secondary | ICD-10-CM | POA: Diagnosis not present

## 2021-06-23 DIAGNOSIS — Z992 Dependence on renal dialysis: Secondary | ICD-10-CM | POA: Diagnosis not present

## 2021-06-23 DIAGNOSIS — Z87440 Personal history of urinary (tract) infections: Secondary | ICD-10-CM | POA: Diagnosis not present

## 2021-06-23 NOTE — Telephone Encounter (Signed)
Return call to [facility] PT with [facility]. Stated initial assessment done today; requesting verbal order for "PT once a week x 7 weeks" for strengthening, education, and education. Also requesting SW evaluation to assess for any community resources pt may be qualify for; and Nursing evaluation for medication management and education - stated pt is not checking his BS's; not taking his meds correctly. VO given - if inappropriate, let me know. Thanks

## 2021-06-23 NOTE — Telephone Encounter (Signed)
Soni with [facility] requesting VO for PT. Please call back.

## 2021-06-24 NOTE — Telephone Encounter (Addendum)
Agree with verbal orders, thank you Glenda. It looks like he was supposed to have an in-person appointment last week but looks like it was canceled. If we can have him reschedule this that would be great. Thanks

## 2021-06-26 ENCOUNTER — Other Ambulatory Visit (HOSPITAL_COMMUNITY): Payer: Self-pay

## 2021-06-27 ENCOUNTER — Emergency Department (HOSPITAL_COMMUNITY)
Admission: EM | Admit: 2021-06-27 | Discharge: 2021-06-28 | Disposition: A | Discharge status: Home / Self Care | Payer: Medicare Other | Attending: Emergency Medicine | Admitting: Emergency Medicine

## 2021-06-27 ENCOUNTER — Other Ambulatory Visit: Payer: Self-pay

## 2021-06-27 ENCOUNTER — Emergency Department (HOSPITAL_COMMUNITY): Payer: Medicare Other

## 2021-06-27 DIAGNOSIS — E1122 Type 2 diabetes mellitus with diabetic chronic kidney disease: Secondary | ICD-10-CM | POA: Insufficient documentation

## 2021-06-27 DIAGNOSIS — D649 Anemia, unspecified: Secondary | ICD-10-CM | POA: Diagnosis not present

## 2021-06-27 DIAGNOSIS — Z794 Long term (current) use of insulin: Secondary | ICD-10-CM | POA: Diagnosis not present

## 2021-06-27 DIAGNOSIS — Z7951 Long term (current) use of inhaled steroids: Secondary | ICD-10-CM | POA: Insufficient documentation

## 2021-06-27 DIAGNOSIS — I700 Atherosclerosis of aorta: Secondary | ICD-10-CM | POA: Diagnosis not present

## 2021-06-27 DIAGNOSIS — Z992 Dependence on renal dialysis: Secondary | ICD-10-CM | POA: Diagnosis not present

## 2021-06-27 DIAGNOSIS — I10 Essential (primary) hypertension: Secondary | ICD-10-CM

## 2021-06-27 DIAGNOSIS — I120 Hypertensive chronic kidney disease with stage 5 chronic kidney disease or end stage renal disease: Secondary | ICD-10-CM | POA: Insufficient documentation

## 2021-06-27 DIAGNOSIS — I739 Peripheral vascular disease, unspecified: Secondary | ICD-10-CM

## 2021-06-27 DIAGNOSIS — N186 End stage renal disease: Secondary | ICD-10-CM | POA: Diagnosis not present

## 2021-06-27 DIAGNOSIS — M79605 Pain in left leg: Secondary | ICD-10-CM

## 2021-06-27 DIAGNOSIS — E1151 Type 2 diabetes mellitus with diabetic peripheral angiopathy without gangrene: Secondary | ICD-10-CM | POA: Diagnosis not present

## 2021-06-27 DIAGNOSIS — Z7982 Long term (current) use of aspirin: Secondary | ICD-10-CM | POA: Insufficient documentation

## 2021-06-27 DIAGNOSIS — R109 Unspecified abdominal pain: Secondary | ICD-10-CM | POA: Diagnosis not present

## 2021-06-27 DIAGNOSIS — Z79899 Other long term (current) drug therapy: Secondary | ICD-10-CM | POA: Insufficient documentation

## 2021-06-27 DIAGNOSIS — E119 Type 2 diabetes mellitus without complications: Secondary | ICD-10-CM

## 2021-06-27 DIAGNOSIS — Z7984 Long term (current) use of oral hypoglycemic drugs: Secondary | ICD-10-CM | POA: Insufficient documentation

## 2021-06-27 LAB — BASIC METABOLIC PANEL
Anion gap: 12 (ref 5–15)
BUN: 57 mg/dL — ABNORMAL HIGH (ref 8–23)
CO2: 23 mmol/L (ref 22–32)
Calcium: 7.2 mg/dL — ABNORMAL LOW (ref 8.9–10.3)
Chloride: 96 mmol/L — ABNORMAL LOW (ref 98–111)
Creatinine, Ser: 6.78 mg/dL — ABNORMAL HIGH (ref 0.61–1.24)
GFR, Estimated: 9 mL/min — ABNORMAL LOW (ref >60)
Glucose, Bld: 202 mg/dL — ABNORMAL HIGH (ref 70–99)
Potassium: 4.8 mmol/L (ref 3.5–5.1)
Sodium: 131 mmol/L — ABNORMAL LOW (ref 135–145)

## 2021-06-27 LAB — POC OCCULT BLOOD, ED: Fecal Occult Bld: NEGATIVE

## 2021-06-27 LAB — CBC
HCT: 22.4 % — ABNORMAL LOW (ref 39.0–52.0)
Hemoglobin: 7.3 g/dL — ABNORMAL LOW (ref 13.0–17.0)
MCH: 29.1 pg (ref 26.0–34.0)
MCHC: 32.6 g/dL (ref 30.0–36.0)
MCV: 89.2 fL (ref 80.0–100.0)
Platelets: 218 10*3/uL (ref 150–400)
RBC: 2.51 MIL/uL — ABNORMAL LOW (ref 4.22–5.81)
RDW: 14.2 % (ref 11.5–15.5)
WBC: 8.7 10*3/uL (ref 4.0–10.5)
nRBC: 0 % (ref 0.0–0.2)

## 2021-06-27 MED ORDER — SODIUM CHLORIDE 0.9% IV SOLUTION: Freq: Once | INTRAVENOUS | Status: AC

## 2021-06-27 MED ORDER — TRAMADOL HCL 50 MG PO TABS
50.0000 mg | ORAL_TABLET | Freq: Two times a day (BID) | ORAL | 0 refills | Status: DC | PRN
  Filled 2021-06-27: qty 10, 5d supply, fill #0

## 2021-06-27 MED ORDER — PREDNISONE 20 MG PO TABS
60.0000 mg | ORAL_TABLET | Freq: Once | ORAL | Status: AC
  Administered 2021-06-27: 60 mg via ORAL
  Filled 2021-06-27: qty 3

## 2021-06-27 MED ORDER — OXYCODONE-ACETAMINOPHEN 5-325 MG PO TABS
1.0000 | ORAL_TABLET | Freq: Once | ORAL | Status: AC
  Administered 2021-06-27: 1 via ORAL
  Filled 2021-06-27: qty 1

## 2021-06-27 NOTE — ED Triage Notes (Signed)
Pt with hx of R BKA and L partial foot amputation here for eval of L leg pain from hip all the way down to foot without injury x 2 weeks. Concerned d/t hx of previous amputation. Taking gabapentin without relief.

## 2021-06-28 LAB — RETICULOCYTES
Immature Retic Fract: 18.8 % — ABNORMAL HIGH (ref 2.3–15.9)
RBC.: 2.69 MIL/uL — ABNORMAL LOW (ref 4.22–5.81)
Retic Count, Absolute: 36 10*3/uL (ref 19.0–186.0)
Retic Ct Pct: 1.3 % (ref 0.4–3.1)

## 2021-06-28 LAB — PREPARE RBC (CROSSMATCH)

## 2021-06-28 LAB — IRON AND TIBC
Iron: 45 ug/dL (ref 45–182)
Saturation Ratios: 19 % (ref 17.9–39.5)
TIBC: 232 ug/dL — ABNORMAL LOW (ref 250–450)
UIBC: 187 ug/dL

## 2021-06-28 LAB — VITAMIN B12: Vitamin B-12: 378 pg/mL (ref 180–914)

## 2021-06-28 LAB — FERRITIN: Ferritin: 761 ng/mL — ABNORMAL HIGH (ref 24–336)

## 2021-06-28 LAB — FOLATE: Folate: 9.6 ng/mL (ref >5.9)

## 2021-06-28 NOTE — ED Notes (Signed)
Blood bank calls to notify this RN delay in finding match for blood due to antibody testing.

## 2021-06-29 ENCOUNTER — Other Ambulatory Visit (HOSPITAL_COMMUNITY): Payer: Self-pay

## 2021-06-29 DIAGNOSIS — Z992 Dependence on renal dialysis: Secondary | ICD-10-CM | POA: Diagnosis not present

## 2021-06-29 DIAGNOSIS — N186 End stage renal disease: Secondary | ICD-10-CM | POA: Diagnosis not present

## 2021-06-29 DIAGNOSIS — N2581 Secondary hyperparathyroidism of renal origin: Secondary | ICD-10-CM | POA: Diagnosis not present

## 2021-06-29 DIAGNOSIS — T8249XD Other complication of vascular dialysis catheter, subsequent encounter: Secondary | ICD-10-CM | POA: Diagnosis not present

## 2021-06-29 LAB — BPAM RBC
Blood Product Expiration Date: 202307112359
Blood Product Expiration Date: 202307112359
ISSUE DATE / TIME: 202306250435
Unit Type and Rh: 6200
Unit Type and Rh: 6200

## 2021-06-29 LAB — TYPE AND SCREEN
ABO/RH(D): A POS
Antibody Screen: POSITIVE
Unit division: 0
Unit division: 0
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
## 2021-06-30 ENCOUNTER — Encounter: Payer: Self-pay | Admitting: Vascular Surgery

## 2021-06-30 ENCOUNTER — Other Ambulatory Visit: Payer: Self-pay

## 2021-06-30 ENCOUNTER — Ambulatory Visit (INDEPENDENT_AMBULATORY_CARE_PROVIDER_SITE_OTHER): Payer: Medicare Other | Admitting: Vascular Surgery

## 2021-06-30 ENCOUNTER — Telehealth: Payer: Self-pay

## 2021-06-30 VITALS — BP 143/77 | HR 87 | Temp 98.7°F | Resp 20 | Ht 71.0 in | Wt 174.0 lb

## 2021-06-30 DIAGNOSIS — Z992 Dependence on renal dialysis: Secondary | ICD-10-CM

## 2021-06-30 DIAGNOSIS — N186 End stage renal disease: Secondary | ICD-10-CM | POA: Diagnosis not present

## 2021-07-01 ENCOUNTER — Encounter (HOSPITAL_COMMUNITY): Payer: Self-pay | Admitting: Vascular Surgery

## 2021-07-01 ENCOUNTER — Other Ambulatory Visit: Payer: Self-pay

## 2021-07-01 ENCOUNTER — Telehealth: Payer: Self-pay | Admitting: *Deleted

## 2021-07-01 DIAGNOSIS — Z87440 Personal history of urinary (tract) infections: Secondary | ICD-10-CM | POA: Diagnosis not present

## 2021-07-01 DIAGNOSIS — G3184 Mild cognitive impairment, so stated: Secondary | ICD-10-CM | POA: Diagnosis not present

## 2021-07-01 DIAGNOSIS — Z89511 Acquired absence of right leg below knee: Secondary | ICD-10-CM | POA: Diagnosis not present

## 2021-07-01 DIAGNOSIS — N2581 Secondary hyperparathyroidism of renal origin: Secondary | ICD-10-CM | POA: Diagnosis not present

## 2021-07-01 DIAGNOSIS — E1122 Type 2 diabetes mellitus with diabetic chronic kidney disease: Secondary | ICD-10-CM | POA: Diagnosis not present

## 2021-07-01 DIAGNOSIS — Z9181 History of falling: Secondary | ICD-10-CM | POA: Diagnosis not present

## 2021-07-01 DIAGNOSIS — Z7982 Long term (current) use of aspirin: Secondary | ICD-10-CM | POA: Diagnosis not present

## 2021-07-01 DIAGNOSIS — E1140 Type 2 diabetes mellitus with diabetic neuropathy, unspecified: Secondary | ICD-10-CM | POA: Diagnosis not present

## 2021-07-01 DIAGNOSIS — D62 Acute posthemorrhagic anemia: Secondary | ICD-10-CM | POA: Diagnosis not present

## 2021-07-01 DIAGNOSIS — I110 Hypertensive heart disease with heart failure: Secondary | ICD-10-CM | POA: Diagnosis not present

## 2021-07-01 DIAGNOSIS — I509 Heart failure, unspecified: Secondary | ICD-10-CM | POA: Diagnosis not present

## 2021-07-01 DIAGNOSIS — I700 Atherosclerosis of aorta: Secondary | ICD-10-CM | POA: Diagnosis not present

## 2021-07-01 DIAGNOSIS — Z794 Long term (current) use of insulin: Secondary | ICD-10-CM | POA: Diagnosis not present

## 2021-07-01 DIAGNOSIS — E785 Hyperlipidemia, unspecified: Secondary | ICD-10-CM | POA: Diagnosis not present

## 2021-07-01 DIAGNOSIS — K219 Gastro-esophageal reflux disease without esophagitis: Secondary | ICD-10-CM | POA: Diagnosis not present

## 2021-07-01 DIAGNOSIS — T8249XD Other complication of vascular dialysis catheter, subsequent encounter: Secondary | ICD-10-CM | POA: Diagnosis not present

## 2021-07-01 DIAGNOSIS — F32A Depression, unspecified: Secondary | ICD-10-CM | POA: Diagnosis not present

## 2021-07-01 DIAGNOSIS — E1151 Type 2 diabetes mellitus with diabetic peripheral angiopathy without gangrene: Secondary | ICD-10-CM | POA: Diagnosis not present

## 2021-07-01 DIAGNOSIS — N186 End stage renal disease: Secondary | ICD-10-CM | POA: Diagnosis not present

## 2021-07-01 DIAGNOSIS — Z992 Dependence on renal dialysis: Secondary | ICD-10-CM | POA: Diagnosis not present

## 2021-07-01 DIAGNOSIS — Z95828 Presence of other vascular implants and grafts: Secondary | ICD-10-CM | POA: Diagnosis not present

## 2021-07-01 DIAGNOSIS — I252 Old myocardial infarction: Secondary | ICD-10-CM | POA: Diagnosis not present

## 2021-07-01 NOTE — Telephone Encounter (Signed)
VO given to [facility] RN/CM with [facility].

## 2021-07-01 NOTE — Telephone Encounter (Signed)
Call from [place] with [facility] - requesting verbal order " Diabetes/CHF education for once a week x 5 weeks". States pt did not realize he has CHF. Sending to the doctor for approval or disapproval.

## 2021-07-01 NOTE — Telephone Encounter (Signed)
Agree with approval. Thanks!

## 2021-07-02 ENCOUNTER — Ambulatory Visit (HOSPITAL_BASED_OUTPATIENT_CLINIC_OR_DEPARTMENT_OTHER): Payer: Medicare Other | Admitting: Anesthesiology

## 2021-07-02 ENCOUNTER — Ambulatory Visit (HOSPITAL_COMMUNITY)
Admission: RE | Admit: 2021-07-02 | Discharge: 2021-07-02 | Disposition: A | Discharge status: Home / Self Care | Payer: Medicare Other | Attending: Vascular Surgery | Admitting: Vascular Surgery

## 2021-07-02 ENCOUNTER — Encounter (HOSPITAL_COMMUNITY)
Admission: RE | Disposition: A | Discharge status: Home / Self Care | Payer: Self-pay | Source: Home / Self Care | Attending: Vascular Surgery

## 2021-07-02 ENCOUNTER — Ambulatory Visit (HOSPITAL_COMMUNITY): Payer: Medicare Other | Admitting: Anesthesiology

## 2021-07-02 ENCOUNTER — Other Ambulatory Visit (HOSPITAL_COMMUNITY): Payer: Self-pay

## 2021-07-02 DIAGNOSIS — I5022 Chronic systolic (congestive) heart failure: Secondary | ICD-10-CM | POA: Insufficient documentation

## 2021-07-02 DIAGNOSIS — D631 Anemia in chronic kidney disease: Secondary | ICD-10-CM | POA: Insufficient documentation

## 2021-07-02 DIAGNOSIS — F17210 Nicotine dependence, cigarettes, uncomplicated: Secondary | ICD-10-CM | POA: Insufficient documentation

## 2021-07-02 DIAGNOSIS — N186 End stage renal disease: Secondary | ICD-10-CM | POA: Insufficient documentation

## 2021-07-02 DIAGNOSIS — E1151 Type 2 diabetes mellitus with diabetic peripheral angiopathy without gangrene: Secondary | ICD-10-CM | POA: Diagnosis not present

## 2021-07-02 DIAGNOSIS — E1165 Type 2 diabetes mellitus with hyperglycemia: Secondary | ICD-10-CM | POA: Diagnosis not present

## 2021-07-02 DIAGNOSIS — E1140 Type 2 diabetes mellitus with diabetic neuropathy, unspecified: Secondary | ICD-10-CM | POA: Insufficient documentation

## 2021-07-02 DIAGNOSIS — Z992 Dependence on renal dialysis: Secondary | ICD-10-CM | POA: Diagnosis not present

## 2021-07-02 DIAGNOSIS — I120 Hypertensive chronic kidney disease with stage 5 chronic kidney disease or end stage renal disease: Secondary | ICD-10-CM

## 2021-07-02 DIAGNOSIS — K219 Gastro-esophageal reflux disease without esophagitis: Secondary | ICD-10-CM | POA: Diagnosis not present

## 2021-07-02 DIAGNOSIS — I272 Pulmonary hypertension, unspecified: Secondary | ICD-10-CM | POA: Diagnosis not present

## 2021-07-02 DIAGNOSIS — I252 Old myocardial infarction: Secondary | ICD-10-CM | POA: Diagnosis not present

## 2021-07-02 DIAGNOSIS — Z794 Long term (current) use of insulin: Secondary | ICD-10-CM | POA: Diagnosis not present

## 2021-07-02 DIAGNOSIS — N185 Chronic kidney disease, stage 5: Secondary | ICD-10-CM | POA: Diagnosis not present

## 2021-07-02 DIAGNOSIS — F17200 Nicotine dependence, unspecified, uncomplicated: Secondary | ICD-10-CM | POA: Diagnosis not present

## 2021-07-02 DIAGNOSIS — I132 Hypertensive heart and chronic kidney disease with heart failure and with stage 5 chronic kidney disease, or end stage renal disease: Secondary | ICD-10-CM | POA: Diagnosis not present

## 2021-07-02 DIAGNOSIS — E1122 Type 2 diabetes mellitus with diabetic chronic kidney disease: Secondary | ICD-10-CM | POA: Insufficient documentation

## 2021-07-02 HISTORY — PX: AV FISTULA PLACEMENT: SHX1204

## 2021-07-02 LAB — POCT I-STAT, CHEM 8
BUN: 19 mg/dL (ref 8–23)
Calcium, Ion: 0.9 mmol/L — ABNORMAL LOW (ref 1.15–1.40)
Chloride: 93 mmol/L — ABNORMAL LOW (ref 98–111)
Creatinine, Ser: 3.3 mg/dL — ABNORMAL HIGH (ref 0.61–1.24)
Glucose, Bld: 258 mg/dL — ABNORMAL HIGH (ref 70–99)
HCT: 31 % — ABNORMAL LOW (ref 39.0–52.0)
Hemoglobin: 10.5 g/dL — ABNORMAL LOW (ref 13.0–17.0)
Potassium: 4.1 mmol/L (ref 3.5–5.1)
Sodium: 135 mmol/L (ref 135–145)
TCO2: 31 mmol/L (ref 22–32)

## 2021-07-02 LAB — GLUCOSE, CAPILLARY
Glucose-Capillary: 263 mg/dL — ABNORMAL HIGH (ref 70–99)
Glucose-Capillary: 212 mg/dL — ABNORMAL HIGH (ref 70–99)

## 2021-07-02 SURGERY — ARTERIOVENOUS (AV) FISTULA CREATION
Anesthesia: Regional | Site: Arm Lower | Laterality: Left

## 2021-07-02 MED ORDER — MIDAZOLAM HCL 2 MG/2ML IJ SOLN
INTRAMUSCULAR | Status: AC
  Filled 2021-07-02: qty 2

## 2021-07-02 MED ORDER — CARVEDILOL 6.25 MG PO TABS
6.2500 mg | ORAL_TABLET | Freq: Two times a day (BID) | ORAL | Status: DC
  Administered 2021-07-02: 6.25 mg via ORAL

## 2021-07-02 MED ORDER — FENTANYL CITRATE (PF) 250 MCG/5ML IJ SOLN
INTRAMUSCULAR | Status: DC | PRN
Start: 2021-07-02 — End: 2021-07-02
  Administered 2021-07-02 (×3): 50 ug via INTRAVENOUS

## 2021-07-02 MED ORDER — LIDOCAINE-EPINEPHRINE (PF) 1.5 %-1:200000 IJ SOLN
INTRAMUSCULAR | Status: DC | PRN
  Administered 2021-07-02: 10 mL via PERINEURAL

## 2021-07-02 MED ORDER — CEFAZOLIN SODIUM-DEXTROSE 2-4 GM/100ML-% IV SOLN
2.0000 g | INTRAVENOUS | Status: AC
  Administered 2021-07-02: 2 g via INTRAVENOUS
  Filled 2021-07-02: qty 100

## 2021-07-02 MED ORDER — CARVEDILOL 3.125 MG PO TABS
ORAL_TABLET | ORAL | Status: AC
  Filled 2021-07-02: qty 2

## 2021-07-02 MED ORDER — ONDANSETRON HCL 4 MG/2ML IJ SOLN
INTRAMUSCULAR | Status: DC | PRN
  Administered 2021-07-02: 4 mg via INTRAVENOUS

## 2021-07-02 MED ORDER — FENTANYL CITRATE (PF) 250 MCG/5ML IJ SOLN
INTRAMUSCULAR | Status: AC
  Filled 2021-07-02: qty 5

## 2021-07-02 MED ORDER — OXYCODONE-ACETAMINOPHEN 5-325 MG PO TABS
1.0000 | ORAL_TABLET | ORAL | 0 refills | Status: DC | PRN
  Filled 2021-07-02: qty 20, 4d supply, fill #0

## 2021-07-02 MED ORDER — CHLORHEXIDINE GLUCONATE 0.12 % MT SOLN: 15.0000 mL | Freq: Once | OROMUCOSAL | Status: AC

## 2021-07-02 MED ORDER — ORAL CARE MOUTH RINSE: 15.0000 mL | Freq: Once | OROMUCOSAL | Status: AC

## 2021-07-02 MED ORDER — CHLORHEXIDINE GLUCONATE 0.12 % MT SOLN
OROMUCOSAL | Status: AC
  Administered 2021-07-02: 15 mL via OROMUCOSAL
  Filled 2021-07-02: qty 15

## 2021-07-02 MED ORDER — PROPOFOL 10 MG/ML IV BOLUS
INTRAVENOUS | Status: AC
  Filled 2021-07-02: qty 20

## 2021-07-02 MED ORDER — ONDANSETRON HCL 4 MG/2ML IJ SOLN
INTRAMUSCULAR | Status: AC
  Filled 2021-07-02: qty 2

## 2021-07-02 MED ORDER — CHLORHEXIDINE GLUCONATE 4 % EX LIQD: 60.0000 mL | Freq: Once | CUTANEOUS | Status: DC

## 2021-07-02 MED ORDER — LIDOCAINE HCL (PF) 1.5 % IJ SOLN
INTRAMUSCULAR | Status: DC | PRN
  Administered 2021-07-02: 15 mL via PERINEURAL

## 2021-07-02 MED ORDER — SODIUM CHLORIDE 0.9 % IV SOLN: INTRAVENOUS | Status: DC

## 2021-07-02 MED ORDER — HEPARIN 6000 UNIT IRRIGATION SOLUTION
Status: DC | PRN
  Administered 2021-07-02: 1

## 2021-07-02 MED ORDER — PHENYLEPHRINE HCL-NACL 20-0.9 MG/250ML-% IV SOLN
INTRAVENOUS | Status: DC | PRN
  Administered 2021-07-02: 25 ug/min via INTRAVENOUS

## 2021-07-02 MED ORDER — INSULIN ASPART 100 UNIT/ML IJ SOLN
0.0000 [IU] | INTRAMUSCULAR | Status: DC | PRN
  Administered 2021-07-02: 4 [IU] via SUBCUTANEOUS
  Filled 2021-07-02 (×2): qty 1

## 2021-07-02 MED ORDER — PROPOFOL 500 MG/50ML IV EMUL
INTRAVENOUS | Status: DC | PRN
  Administered 2021-07-02: 100 ug/kg/min via INTRAVENOUS

## 2021-07-02 MED ORDER — FENTANYL CITRATE (PF) 100 MCG/2ML IJ SOLN
INTRAMUSCULAR | Status: AC
  Filled 2021-07-02: qty 2

## 2021-07-02 MED ORDER — HEPARIN 6000 UNIT IRRIGATION SOLUTION
Status: AC
  Filled 2021-07-02: qty 500

## 2021-07-02 MED ORDER — 0.9 % SODIUM CHLORIDE (POUR BTL) OPTIME
TOPICAL | Status: DC | PRN
  Administered 2021-07-02: 1000 mL

## 2021-07-02 SURGICAL SUPPLY — 39 items
ARMBAND PINK RESTRICT EXTREMIT (MISCELLANEOUS) ×2 IMPLANT
BENZOIN TINCTURE PRP APPL 2/3 (GAUZE/BANDAGES/DRESSINGS) ×2 IMPLANT
CANISTER SUCT 3000ML PPV (MISCELLANEOUS) ×2 IMPLANT
CANNULA VESSEL 3MM 2 BLNT TIP (CANNULA) ×2 IMPLANT
CHLORAPREP W/TINT 26 (MISCELLANEOUS) ×2 IMPLANT
CLIP LIGATING EXTRA MED SLVR (CLIP) ×2 IMPLANT
CLIP LIGATING EXTRA SM BLUE (MISCELLANEOUS) ×2 IMPLANT
CLIP TI MEDIUM 24 (CLIP) ×1 IMPLANT
CLIP TI WIDE RED SMALL 24 (CLIP) ×1 IMPLANT
COVER PROBE W GEL 5X96 (DRAPES) IMPLANT
DERMABOND ADVANCED (GAUZE/BANDAGES/DRESSINGS) ×1
DERMABOND ADVANCED .7 DNX12 (GAUZE/BANDAGES/DRESSINGS) IMPLANT
ELECT REM PT RETURN 9FT ADLT (ELECTROSURGICAL) ×2
ELECTRODE REM PT RTRN 9FT ADLT (ELECTROSURGICAL) ×1 IMPLANT
GLOVE SURG SS PI 8.0 STRL IVOR (GLOVE) ×2 IMPLANT
GOWN STRL REUS W/ TWL LRG LVL3 (GOWN DISPOSABLE) ×2 IMPLANT
GOWN STRL REUS W/ TWL XL LVL3 (GOWN DISPOSABLE) ×1 IMPLANT
GOWN STRL REUS W/TWL LRG LVL3 (GOWN DISPOSABLE) ×4
GOWN STRL REUS W/TWL XL LVL3 (GOWN DISPOSABLE) ×2
INSERT FOGARTY SM (MISCELLANEOUS) IMPLANT
KIT BASIN OR (CUSTOM PROCEDURE TRAY) ×2 IMPLANT
KIT TURNOVER KIT B (KITS) ×2 IMPLANT
NDL 18GX1X1/2 (RX/OR ONLY) (NEEDLE) IMPLANT
NEEDLE 18GX1X1/2 (RX/OR ONLY) (NEEDLE) IMPLANT
NS IRRIG 1000ML POUR BTL (IV SOLUTION) ×2 IMPLANT
PACK CV ACCESS (CUSTOM PROCEDURE TRAY) ×2 IMPLANT
PAD ARMBOARD 7.5X6 YLW CONV (MISCELLANEOUS) ×4 IMPLANT
SLING ARM FOAM STRAP LRG (SOFTGOODS) ×1 IMPLANT
SLING ARM FOAM STRAP MED (SOFTGOODS) IMPLANT
STRIP CLOSURE SKIN 1/2X4 (GAUZE/BANDAGES/DRESSINGS) ×2 IMPLANT
SUT MNCRL AB 4-0 PS2 18 (SUTURE) ×2 IMPLANT
SUT PROLENE 6 0 BV (SUTURE) ×3 IMPLANT
SUT SILK 2 0 SH (SUTURE) ×1 IMPLANT
SUT VIC AB 3-0 SH 27 (SUTURE) ×2
SUT VIC AB 3-0 SH 27X BRD (SUTURE) ×1 IMPLANT
SYR 3ML LL SCALE MARK (SYRINGE) IMPLANT
TOWEL GREEN STERILE (TOWEL DISPOSABLE) ×2 IMPLANT
UNDERPAD 30X36 HEAVY ABSORB (UNDERPADS AND DIAPERS) ×2 IMPLANT
WATER STERILE IRR 1000ML POUR (IV SOLUTION) ×2 IMPLANT

## 2021-07-02 NOTE — Transfer of Care (Signed)
Immediate Anesthesia Transfer of Care Note  Patient: [name]  Procedure(s) Performed: LEFT ARM ARTERIOVENOUS (AV) FISTULA CREATION (Left: Arm Lower)  Patient Location: PACU  Anesthesia Type:MAC combined with regional for post-op pain  Level of Consciousness: awake, alert  and oriented  Airway & Oxygen Therapy: Patient Spontanous Breathing and Patient connected to face mask oxygen  Post-op Assessment: Report given to RN and Post -op Vital signs reviewed and stable  Post vital signs: Reviewed and stable  Last Vitals:  Vitals Value Taken Time  BP 96/59 07/02/21 1000  Temp    Pulse 75 07/02/21 1005  Resp 29 07/02/21 1005  SpO2 94 % 07/02/21 1005  Vitals shown include unvalidated device data.  Last Pain:  Vitals:   07/02/21 0748  TempSrc:   PainSc: 0-No pain         Complications: No notable events documented.

## 2021-07-02 NOTE — Anesthesia Procedure Notes (Signed)
Anesthesia Regional Block: Supraclavicular block   Pre-Anesthetic Checklist: , timeout performed,  Correct Patient, Correct Site, Correct Laterality,  Correct Procedure, Correct Position, site marked,  Risks and benefits discussed,  Surgical consent,  Pre-op evaluation,  At surgeon's request and post-op pain management  Laterality: Upper  Prep: chloraprep       Needles:  Injection technique: Single-shot  Needle Type: Stimiplex          Additional Needles:   Procedures:,,,, ultrasound used (permanent image in chart),,    Narrative:  Start time: 07/02/2021 7:51 AM End time: 07/02/2021 8:11 AM Injection made incrementally with aspirations every 5 mL.  Performed by: Personally  Anesthesiologist: Nolon Nations, MD  Additional Notes: BP cuff, SpO2 and EKG monitors applied. Sedation begun. Nerve location verified with ultrasound. Anesthetic injected incrementally, slowly, and after neg aspirations under direct u/s guidance. Good perineural spread. Tolerated well.

## 2021-07-02 NOTE — Anesthesia Preprocedure Evaluation (Addendum)
Anesthesia Evaluation  Patient identified by MRN, date of birth, ID band Patient awake    Reviewed: Allergy & Precautions, NPO status , Patient's Chart, lab work & pertinent test results  Airway Mallampati: II  TM Distance: >3 FB Neck ROM: Full    Dental  (+) Edentulous Upper, Edentulous Lower, Poor Dentition, Missing, Chipped, Dental Advisory Given   Pulmonary pneumonia, Current Smoker,    Pulmonary exam normal breath sounds clear to auscultation       Cardiovascular hypertension, pulmonary hypertension+ Past MI and + Peripheral Vascular Disease  Normal cardiovascular exam+ Valvular Problems/Murmurs MR  Rhythm:Regular Rate:Normal  Echo 05/2021 1. Left ventricular ejection fraction, by estimation, is 30 to 35%. The left ventricle has moderately decreased function. Wall motion difficult to assess due to incomplete visualization of the LV endocardium. Based on limited views, all anterior LV segments and the basal-to-mid septal segments appear hypokinetic. There is mild concentric left ventricular hypertrophy. Left ventricular diastolic parameters are consistent with Grade II diastolic dysfunction (pseudonormalization).  2. Right ventricular systolic function is moderately reduced. The right ventricular size is mildly enlarged. There is mildly elevated pulmonary artery systolic pressure. The estimated right ventricular systolic pressure is 08.6 mmHg.  3. The mitral valve is abnormal. Mild to moderate mitral valve regurgitation.  4. The aortic valve is tricuspid. There is mild calcification of the aortic valve. There is mild thickening of the aortic valve. Aortic valve regurgitation is not visualized. Aortic valve sclerosis/calcification is present, without any evidence of aortic stenosis.  5. The inferior vena cava is dilated in size with <50% respiratory variability, suggesting right atrial pressure of 15 mmHg.  6. Findings concerning for  biventricular dysfunction with moderately reduced LV and RV systolic function as well as wall motion abnormalities suggestive of possible LAD disease.    Neuro/Psych  Neuromuscular disease    GI/Hepatic Neg liver ROS, GERD  ,  Endo/Other  diabetes, Poorly Controlled  Renal/GU ESRF and Renal InsufficiencyRenal disease     Musculoskeletal negative musculoskeletal ROS (+)   Abdominal   Peds  Hematology  (+) Blood dyscrasia, anemia ,   Anesthesia Other Findings   Reproductive/Obstetrics                           Anesthesia Physical Anesthesia Plan  ASA: 4  Anesthesia Plan: Regional   Post-op Pain Management: Tylenol PO (pre-op)*   Induction: Intravenous  PONV Risk Score and Plan: 0 and Ondansetron, Propofol infusion and Treatment may vary due to age or medical condition  Airway Management Planned: Natural Airway  Additional Equipment:   Intra-op Plan:   Post-operative Plan:   Informed Consent: I have reviewed the patients History and Physical, chart, labs and discussed the procedure including the risks, benefits and alternatives for the proposed anesthesia with the patient or authorized representative who has indicated his/her understanding and acceptance.     Dental advisory given  Plan Discussed with: CRNA  Anesthesia Plan Comments:        Anesthesia Quick Evaluation

## 2021-07-02 NOTE — Progress Notes (Signed)
Spoke with dr germeroth re: cbg and bp = no treatment

## 2021-07-02 NOTE — Op Note (Signed)
DATE OF SERVICE: 07/02/2021  PATIENT:  [name]  62 y.o. male  PRE-OPERATIVE DIAGNOSIS:  ESRD  POST-OPERATIVE DIAGNOSIS:  Same  PROCEDURE:   Left ulnar-basilic arteriovenous fistula creation  SURGEON:  Surgeon(s) and Role:    * Cherre Robins, MD - Primary  ASSISTANT: Ignacia Marvel, RNFA  ANESTHESIA:   regional and IV sedation  EBL: minimal  BLOOD ADMINISTERED:none  DRAINS: none   LOCAL MEDICATIONS USED:  NONE  SPECIMEN:  none  COUNTS: confirmed correct.  TOURNIQUET:  none  PATIENT DISPOSITION:  PACU - hemodynamically stable.   Delay start of Pharmacological VTE agent (>24hrs) due to surgical blood loss or risk of bleeding: no  INDICATION FOR PROCEDURE: [name] is a 61 y.o. male with ESRD in need of permanent dialysis access. After careful discussion of risks, benefits, and alternatives the patient was offered AV fistula versus graft. The patient understood and wished to proceed.  OPERATIVE FINDINGS: healthy basilic vein. High bifurcation of brachial artery. Ulnar artery closer to basilic vein.  DESCRIPTION OF PROCEDURE: After identification of the patient in the pre-operative holding area, the patient was transferred to the operating room. The patient was positioned supine on the operating room table. Anesthesia was induced. The left arm was prepped and draped in standard fashion. A surgical pause was performed confirming correct patient, procedure, and operative location.  Using intraoperative ultrasound the brachial artery and basilic vein were mapped. The brachial artery had a high bifurcation. The ulnar artery was closer to the basilic vein at the elbow. An incision was planned over the course of the two vessels to allow fistula creation.  Incision was created.  Incision was carried down through subcutaneous tissue.  The aponeurosis of the biceps tendon was divided.  The brachial sheath was identified.  The ulnar artery was skeletonized.  The artery was encircled  with 2 Silastic Vesseloops.  I clamped the artery and investigated the wrist with a doppler machine to confirm it was indeed the ulnar artery. Next attention was turned to the basilic vein.  This was identified in the medial arm in its typical position.  The vein was mobilized throughout the length of the incision to allow tension-free arteriovenous fistula creation.  The distal end of the vein was clamped with a right angle.  The proximal end of the vein was clamped with a bulldog.  The vein was transected distally.  The stump was oversewn with a 2-0 silk.  The cut end of the vein was spatulated and distended with a mosquito clamp.  The ulnar artery was clamped proximally distally.  The basilic vein was anastomosed to the ulnary artery end-to-side side using continuous running suture of 6-0 Prolene.  Immediately prior to completion the anastomosis was flushed and de-aired.  The anastomosis was completed.  Clamps were released.  Hemostasis was achieved.  An audible bruit was heard in the fistula.  Good doppler flow was noted over the ulnar artery.  Hemostasis was achieved in the surgical bed.  The wound was closed with 3-0 Vicryl and 4-0 Monocryl. Dermabond was applied.   Upon completion of the case instrument and sharps counts were confirmed correct. The patient was transferred to the  PACU in good condition. I was present for all portions of the procedure.  Yevonne Aline. Stanford Breed, MD [facility] Phone Number: [phone] 07/02/2021 9:48 AM

## 2021-07-02 NOTE — Interval H&P Note (Signed)
History and Physical Interval Note:  07/02/2021 7:46 AM  [name]  has presented today for surgery, with the diagnosis of ESRD.  The various methods of treatment have been discussed with the patient and family. After consideration of risks, benefits and other options for treatment, the patient has consented to  Procedure(s) with comments: LEFT ARM ARTERIOVENOUS (AV) FISTULA VERSUS ARTERIOVENOUS GRAFT CREATION (Left) - PERIPHERAL NERVE BLOCK as a surgical intervention.  The patient's history has been reviewed, patient examined, no change in status, stable for surgery.  I have reviewed the patient's chart and labs.  Questions were answered to the patient's satisfaction.     Cherre Robins

## 2021-07-02 NOTE — Anesthesia Procedure Notes (Signed)
Procedure Name: MAC Date/Time: 07/02/2021 8:34 AM  Performed by: Alain Marion, CRNAPre-anesthesia Checklist: Patient identified, Emergency Drugs available, Suction available, Patient being monitored and Timeout performed Oxygen Delivery Method: Simple face mask Placement Confirmation: positive ETCO2

## 2021-07-03 ENCOUNTER — Encounter (HOSPITAL_COMMUNITY): Payer: Self-pay | Admitting: Vascular Surgery

## 2021-07-03 DIAGNOSIS — N186 End stage renal disease: Secondary | ICD-10-CM | POA: Diagnosis not present

## 2021-07-03 DIAGNOSIS — N2581 Secondary hyperparathyroidism of renal origin: Secondary | ICD-10-CM | POA: Diagnosis not present

## 2021-07-03 DIAGNOSIS — T8249XD Other complication of vascular dialysis catheter, subsequent encounter: Secondary | ICD-10-CM | POA: Diagnosis not present

## 2021-07-03 DIAGNOSIS — E1122 Type 2 diabetes mellitus with diabetic chronic kidney disease: Secondary | ICD-10-CM | POA: Diagnosis not present

## 2021-07-03 DIAGNOSIS — Z992 Dependence on renal dialysis: Secondary | ICD-10-CM | POA: Diagnosis not present

## 2021-07-03 NOTE — Anesthesia Postprocedure Evaluation (Signed)
Anesthesia Post Note  Patient: [name]  Procedure(s) Performed: LEFT ARM ARTERIOVENOUS (AV) FISTULA CREATION (Left: Arm Lower)     Patient location during evaluation: PACU Anesthesia Type: Regional Level of consciousness: awake and alert Pain management: pain level controlled Vital Signs Assessment: post-procedure vital signs reviewed and stable Respiratory status: spontaneous breathing Cardiovascular status: stable Anesthetic complications: no   No notable events documented.  Last Vitals:  Vitals:   07/02/21 1015 07/02/21 1030  BP:  104/64  Pulse:  73  Resp:  13  Temp:    SpO2: 92% 94%    Last Pain:  Vitals:   07/02/21 1030  TempSrc:   PainSc: 0-No pain                 Nolon Nations

## 2021-07-06 DIAGNOSIS — Z95828 Presence of other vascular implants and grafts: Secondary | ICD-10-CM | POA: Diagnosis not present

## 2021-07-06 DIAGNOSIS — Z9181 History of falling: Secondary | ICD-10-CM | POA: Diagnosis not present

## 2021-07-06 DIAGNOSIS — E1151 Type 2 diabetes mellitus with diabetic peripheral angiopathy without gangrene: Secondary | ICD-10-CM | POA: Diagnosis not present

## 2021-07-06 DIAGNOSIS — Z992 Dependence on renal dialysis: Secondary | ICD-10-CM | POA: Diagnosis not present

## 2021-07-06 DIAGNOSIS — N186 End stage renal disease: Secondary | ICD-10-CM | POA: Diagnosis not present

## 2021-07-06 DIAGNOSIS — N2581 Secondary hyperparathyroidism of renal origin: Secondary | ICD-10-CM | POA: Diagnosis not present

## 2021-07-06 DIAGNOSIS — K219 Gastro-esophageal reflux disease without esophagitis: Secondary | ICD-10-CM | POA: Diagnosis not present

## 2021-07-06 DIAGNOSIS — I110 Hypertensive heart disease with heart failure: Secondary | ICD-10-CM | POA: Diagnosis not present

## 2021-07-06 DIAGNOSIS — Z794 Long term (current) use of insulin: Secondary | ICD-10-CM | POA: Diagnosis not present

## 2021-07-06 DIAGNOSIS — Z89511 Acquired absence of right leg below knee: Secondary | ICD-10-CM | POA: Diagnosis not present

## 2021-07-06 DIAGNOSIS — Z7982 Long term (current) use of aspirin: Secondary | ICD-10-CM | POA: Diagnosis not present

## 2021-07-06 DIAGNOSIS — F32A Depression, unspecified: Secondary | ICD-10-CM | POA: Diagnosis not present

## 2021-07-06 DIAGNOSIS — G3184 Mild cognitive impairment, so stated: Secondary | ICD-10-CM | POA: Diagnosis not present

## 2021-07-06 DIAGNOSIS — I700 Atherosclerosis of aorta: Secondary | ICD-10-CM | POA: Diagnosis not present

## 2021-07-06 DIAGNOSIS — D62 Acute posthemorrhagic anemia: Secondary | ICD-10-CM | POA: Diagnosis not present

## 2021-07-06 DIAGNOSIS — T8249XD Other complication of vascular dialysis catheter, subsequent encounter: Secondary | ICD-10-CM | POA: Diagnosis not present

## 2021-07-06 DIAGNOSIS — Z87440 Personal history of urinary (tract) infections: Secondary | ICD-10-CM | POA: Diagnosis not present

## 2021-07-06 DIAGNOSIS — I252 Old myocardial infarction: Secondary | ICD-10-CM | POA: Diagnosis not present

## 2021-07-06 DIAGNOSIS — E785 Hyperlipidemia, unspecified: Secondary | ICD-10-CM | POA: Diagnosis not present

## 2021-07-06 DIAGNOSIS — E1122 Type 2 diabetes mellitus with diabetic chronic kidney disease: Secondary | ICD-10-CM | POA: Diagnosis not present

## 2021-07-06 DIAGNOSIS — E1140 Type 2 diabetes mellitus with diabetic neuropathy, unspecified: Secondary | ICD-10-CM | POA: Diagnosis not present

## 2021-07-06 DIAGNOSIS — I509 Heart failure, unspecified: Secondary | ICD-10-CM | POA: Diagnosis not present

## 2021-07-08 ENCOUNTER — Telehealth: Payer: Self-pay | Admitting: Student

## 2021-07-08 DIAGNOSIS — N186 End stage renal disease: Secondary | ICD-10-CM | POA: Diagnosis not present

## 2021-07-08 DIAGNOSIS — Z992 Dependence on renal dialysis: Secondary | ICD-10-CM | POA: Diagnosis not present

## 2021-07-08 DIAGNOSIS — T8249XD Other complication of vascular dialysis catheter, subsequent encounter: Secondary | ICD-10-CM | POA: Diagnosis not present

## 2021-07-08 DIAGNOSIS — N2581 Secondary hyperparathyroidism of renal origin: Secondary | ICD-10-CM | POA: Diagnosis not present

## 2021-07-08 NOTE — Telephone Encounter (Signed)
[facility]  [phone] requesting OT for 1 time a week for 1 week , 2 times a week for 2 weeks, 1 Time a 1 week for 1 week with assessment.  Please call back.

## 2021-07-08 NOTE — Telephone Encounter (Signed)
Returned call to [employer] with [facility]. Requesting VO for HH OT 1 week 1, 2 week 2, 1 week 1, and 1 time for reassessment to work on safety, fall risk, balance, dressing. Verbal auth given. Will route to PCP for agreement/denial.  [name] noted some cognitive deficits, and left hand weakness 2/2 fistula placement.

## 2021-07-08 NOTE — Telephone Encounter (Signed)
Agree  Thank you

## 2021-07-09 DIAGNOSIS — Z95828 Presence of other vascular implants and grafts: Secondary | ICD-10-CM | POA: Diagnosis not present

## 2021-07-09 DIAGNOSIS — F32A Depression, unspecified: Secondary | ICD-10-CM | POA: Diagnosis not present

## 2021-07-09 DIAGNOSIS — E1140 Type 2 diabetes mellitus with diabetic neuropathy, unspecified: Secondary | ICD-10-CM | POA: Diagnosis not present

## 2021-07-09 DIAGNOSIS — I700 Atherosclerosis of aorta: Secondary | ICD-10-CM | POA: Diagnosis not present

## 2021-07-09 DIAGNOSIS — E785 Hyperlipidemia, unspecified: Secondary | ICD-10-CM | POA: Diagnosis not present

## 2021-07-09 DIAGNOSIS — N186 End stage renal disease: Secondary | ICD-10-CM | POA: Diagnosis not present

## 2021-07-09 DIAGNOSIS — I509 Heart failure, unspecified: Secondary | ICD-10-CM | POA: Diagnosis not present

## 2021-07-09 DIAGNOSIS — I110 Hypertensive heart disease with heart failure: Secondary | ICD-10-CM | POA: Diagnosis not present

## 2021-07-09 DIAGNOSIS — Z794 Long term (current) use of insulin: Secondary | ICD-10-CM | POA: Diagnosis not present

## 2021-07-09 DIAGNOSIS — K219 Gastro-esophageal reflux disease without esophagitis: Secondary | ICD-10-CM | POA: Diagnosis not present

## 2021-07-09 DIAGNOSIS — I252 Old myocardial infarction: Secondary | ICD-10-CM | POA: Diagnosis not present

## 2021-07-09 DIAGNOSIS — Z7982 Long term (current) use of aspirin: Secondary | ICD-10-CM | POA: Diagnosis not present

## 2021-07-09 DIAGNOSIS — Z9181 History of falling: Secondary | ICD-10-CM | POA: Diagnosis not present

## 2021-07-09 DIAGNOSIS — D62 Acute posthemorrhagic anemia: Secondary | ICD-10-CM | POA: Diagnosis not present

## 2021-07-09 DIAGNOSIS — G3184 Mild cognitive impairment, so stated: Secondary | ICD-10-CM | POA: Diagnosis not present

## 2021-07-09 DIAGNOSIS — E1122 Type 2 diabetes mellitus with diabetic chronic kidney disease: Secondary | ICD-10-CM | POA: Diagnosis not present

## 2021-07-09 DIAGNOSIS — E1151 Type 2 diabetes mellitus with diabetic peripheral angiopathy without gangrene: Secondary | ICD-10-CM | POA: Diagnosis not present

## 2021-07-09 DIAGNOSIS — Z992 Dependence on renal dialysis: Secondary | ICD-10-CM | POA: Diagnosis not present

## 2021-07-09 DIAGNOSIS — Z87440 Personal history of urinary (tract) infections: Secondary | ICD-10-CM | POA: Diagnosis not present

## 2021-07-09 DIAGNOSIS — Z89511 Acquired absence of right leg below knee: Secondary | ICD-10-CM | POA: Diagnosis not present

## 2021-07-13 DIAGNOSIS — N186 End stage renal disease: Secondary | ICD-10-CM | POA: Diagnosis not present

## 2021-07-13 DIAGNOSIS — T8249XD Other complication of vascular dialysis catheter, subsequent encounter: Secondary | ICD-10-CM | POA: Diagnosis not present

## 2021-07-13 DIAGNOSIS — N2581 Secondary hyperparathyroidism of renal origin: Secondary | ICD-10-CM | POA: Diagnosis not present

## 2021-07-13 DIAGNOSIS — Z992 Dependence on renal dialysis: Secondary | ICD-10-CM | POA: Diagnosis not present

## 2021-07-15 ENCOUNTER — Other Ambulatory Visit (HOSPITAL_COMMUNITY): Payer: Self-pay

## 2021-07-15 ENCOUNTER — Other Ambulatory Visit: Payer: Self-pay | Admitting: Adult Health

## 2021-07-15 ENCOUNTER — Other Ambulatory Visit: Payer: Self-pay

## 2021-07-15 DIAGNOSIS — E1142 Type 2 diabetes mellitus with diabetic polyneuropathy: Secondary | ICD-10-CM

## 2021-07-15 DIAGNOSIS — K219 Gastro-esophageal reflux disease without esophagitis: Secondary | ICD-10-CM

## 2021-07-15 DIAGNOSIS — N186 End stage renal disease: Secondary | ICD-10-CM | POA: Diagnosis not present

## 2021-07-15 DIAGNOSIS — N2581 Secondary hyperparathyroidism of renal origin: Secondary | ICD-10-CM | POA: Diagnosis not present

## 2021-07-15 DIAGNOSIS — T8249XD Other complication of vascular dialysis catheter, subsequent encounter: Secondary | ICD-10-CM | POA: Diagnosis not present

## 2021-07-15 DIAGNOSIS — Z992 Dependence on renal dialysis: Secondary | ICD-10-CM | POA: Diagnosis not present

## 2021-07-15 DIAGNOSIS — I10 Essential (primary) hypertension: Secondary | ICD-10-CM

## 2021-07-15 DIAGNOSIS — I214 Non-ST elevation (NSTEMI) myocardial infarction: Secondary | ICD-10-CM

## 2021-07-15 NOTE — Telephone Encounter (Signed)
It appears the novolog was discontinued during her last hospitalization. He is due for an A1c, can we please schedule him for an appointment for diabetes management? Thank you

## 2021-07-15 NOTE — Telephone Encounter (Signed)
insulin glargine-yfgn (SEMGLEE) 100 UNIT/ML Pen  insulin aspart (NOVOLOG) 100 UNIT/ML injection, REFILL REQUEST @ [facility].

## 2021-07-15 NOTE — Telephone Encounter (Signed)
Patient should have refills at [facility] for semglee.

## 2021-07-16 ENCOUNTER — Other Ambulatory Visit (HOSPITAL_COMMUNITY): Payer: Self-pay

## 2021-07-16 DIAGNOSIS — E1151 Type 2 diabetes mellitus with diabetic peripheral angiopathy without gangrene: Secondary | ICD-10-CM | POA: Diagnosis not present

## 2021-07-16 DIAGNOSIS — I700 Atherosclerosis of aorta: Secondary | ICD-10-CM | POA: Diagnosis not present

## 2021-07-16 DIAGNOSIS — Z7982 Long term (current) use of aspirin: Secondary | ICD-10-CM | POA: Diagnosis not present

## 2021-07-16 DIAGNOSIS — Z9181 History of falling: Secondary | ICD-10-CM | POA: Diagnosis not present

## 2021-07-16 DIAGNOSIS — N186 End stage renal disease: Secondary | ICD-10-CM | POA: Diagnosis not present

## 2021-07-16 DIAGNOSIS — D62 Acute posthemorrhagic anemia: Secondary | ICD-10-CM | POA: Diagnosis not present

## 2021-07-16 DIAGNOSIS — Z95828 Presence of other vascular implants and grafts: Secondary | ICD-10-CM | POA: Diagnosis not present

## 2021-07-16 DIAGNOSIS — Z794 Long term (current) use of insulin: Secondary | ICD-10-CM | POA: Diagnosis not present

## 2021-07-16 DIAGNOSIS — K219 Gastro-esophageal reflux disease without esophagitis: Secondary | ICD-10-CM | POA: Diagnosis not present

## 2021-07-16 DIAGNOSIS — G3184 Mild cognitive impairment, so stated: Secondary | ICD-10-CM | POA: Diagnosis not present

## 2021-07-16 DIAGNOSIS — I110 Hypertensive heart disease with heart failure: Secondary | ICD-10-CM | POA: Diagnosis not present

## 2021-07-16 DIAGNOSIS — E1140 Type 2 diabetes mellitus with diabetic neuropathy, unspecified: Secondary | ICD-10-CM | POA: Diagnosis not present

## 2021-07-16 DIAGNOSIS — I509 Heart failure, unspecified: Secondary | ICD-10-CM | POA: Diagnosis not present

## 2021-07-16 DIAGNOSIS — Z992 Dependence on renal dialysis: Secondary | ICD-10-CM | POA: Diagnosis not present

## 2021-07-16 DIAGNOSIS — Z89511 Acquired absence of right leg below knee: Secondary | ICD-10-CM | POA: Diagnosis not present

## 2021-07-16 DIAGNOSIS — E785 Hyperlipidemia, unspecified: Secondary | ICD-10-CM | POA: Diagnosis not present

## 2021-07-16 DIAGNOSIS — Z87440 Personal history of urinary (tract) infections: Secondary | ICD-10-CM | POA: Diagnosis not present

## 2021-07-16 DIAGNOSIS — E1122 Type 2 diabetes mellitus with diabetic chronic kidney disease: Secondary | ICD-10-CM | POA: Diagnosis not present

## 2021-07-16 DIAGNOSIS — I252 Old myocardial infarction: Secondary | ICD-10-CM | POA: Diagnosis not present

## 2021-07-16 DIAGNOSIS — F32A Depression, unspecified: Secondary | ICD-10-CM | POA: Diagnosis not present

## 2021-07-17 ENCOUNTER — Other Ambulatory Visit: Payer: Self-pay | Admitting: *Deleted

## 2021-07-17 ENCOUNTER — Other Ambulatory Visit (HOSPITAL_COMMUNITY): Payer: Self-pay

## 2021-07-17 DIAGNOSIS — Z992 Dependence on renal dialysis: Secondary | ICD-10-CM | POA: Diagnosis not present

## 2021-07-17 DIAGNOSIS — T8249XD Other complication of vascular dialysis catheter, subsequent encounter: Secondary | ICD-10-CM | POA: Diagnosis not present

## 2021-07-17 DIAGNOSIS — N2581 Secondary hyperparathyroidism of renal origin: Secondary | ICD-10-CM | POA: Diagnosis not present

## 2021-07-17 DIAGNOSIS — N186 End stage renal disease: Secondary | ICD-10-CM | POA: Diagnosis not present

## 2021-07-17 MED ORDER — INSULIN ASPART 100 UNIT/ML IJ SOLN
11.0000 [IU] | Freq: Three times a day (TID) | INTRAMUSCULAR | 2 refills | Status: DC
  Filled 2021-07-17: qty 10, 30d supply, fill #0

## 2021-07-17 MED ORDER — INSULIN GLARGINE-YFGN 100 UNIT/ML ~~LOC~~ SOPN
16.0000 [IU] | PEN_INJECTOR | Freq: Every day | SUBCUTANEOUS | 3 refills | Status: DC
  Filled 2021-07-17: qty 3, 18d supply, fill #0

## 2021-07-18 DIAGNOSIS — D62 Acute posthemorrhagic anemia: Secondary | ICD-10-CM | POA: Diagnosis not present

## 2021-07-18 DIAGNOSIS — Z9181 History of falling: Secondary | ICD-10-CM | POA: Diagnosis not present

## 2021-07-18 DIAGNOSIS — I509 Heart failure, unspecified: Secondary | ICD-10-CM | POA: Diagnosis not present

## 2021-07-18 DIAGNOSIS — Z95828 Presence of other vascular implants and grafts: Secondary | ICD-10-CM | POA: Diagnosis not present

## 2021-07-18 DIAGNOSIS — E1140 Type 2 diabetes mellitus with diabetic neuropathy, unspecified: Secondary | ICD-10-CM | POA: Diagnosis not present

## 2021-07-18 DIAGNOSIS — Z794 Long term (current) use of insulin: Secondary | ICD-10-CM | POA: Diagnosis not present

## 2021-07-18 DIAGNOSIS — I252 Old myocardial infarction: Secondary | ICD-10-CM | POA: Diagnosis not present

## 2021-07-18 DIAGNOSIS — Z87440 Personal history of urinary (tract) infections: Secondary | ICD-10-CM | POA: Diagnosis not present

## 2021-07-18 DIAGNOSIS — K219 Gastro-esophageal reflux disease without esophagitis: Secondary | ICD-10-CM | POA: Diagnosis not present

## 2021-07-18 DIAGNOSIS — G3184 Mild cognitive impairment, so stated: Secondary | ICD-10-CM | POA: Diagnosis not present

## 2021-07-18 DIAGNOSIS — F32A Depression, unspecified: Secondary | ICD-10-CM | POA: Diagnosis not present

## 2021-07-18 DIAGNOSIS — Z992 Dependence on renal dialysis: Secondary | ICD-10-CM | POA: Diagnosis not present

## 2021-07-18 DIAGNOSIS — E1122 Type 2 diabetes mellitus with diabetic chronic kidney disease: Secondary | ICD-10-CM | POA: Diagnosis not present

## 2021-07-18 DIAGNOSIS — E1151 Type 2 diabetes mellitus with diabetic peripheral angiopathy without gangrene: Secondary | ICD-10-CM | POA: Diagnosis not present

## 2021-07-18 DIAGNOSIS — I110 Hypertensive heart disease with heart failure: Secondary | ICD-10-CM | POA: Diagnosis not present

## 2021-07-18 DIAGNOSIS — Z7982 Long term (current) use of aspirin: Secondary | ICD-10-CM | POA: Diagnosis not present

## 2021-07-18 DIAGNOSIS — N186 End stage renal disease: Secondary | ICD-10-CM | POA: Diagnosis not present

## 2021-07-18 DIAGNOSIS — Z89511 Acquired absence of right leg below knee: Secondary | ICD-10-CM | POA: Diagnosis not present

## 2021-07-18 DIAGNOSIS — I700 Atherosclerosis of aorta: Secondary | ICD-10-CM | POA: Diagnosis not present

## 2021-07-18 DIAGNOSIS — E785 Hyperlipidemia, unspecified: Secondary | ICD-10-CM | POA: Diagnosis not present

## 2021-07-20 ENCOUNTER — Other Ambulatory Visit (HOSPITAL_COMMUNITY): Payer: Self-pay

## 2021-07-20 ENCOUNTER — Encounter: Payer: Self-pay | Admitting: Student

## 2021-07-20 ENCOUNTER — Other Ambulatory Visit: Payer: Self-pay

## 2021-07-20 ENCOUNTER — Ambulatory Visit (INDEPENDENT_AMBULATORY_CARE_PROVIDER_SITE_OTHER): Payer: Medicare Other | Admitting: Student

## 2021-07-20 ENCOUNTER — Telehealth: Payer: Self-pay | Admitting: *Deleted

## 2021-07-20 ENCOUNTER — Other Ambulatory Visit: Payer: Self-pay | Admitting: Student

## 2021-07-20 VITALS — BP 156/68 | HR 87 | Temp 98.1°F | Ht 72.0 in | Wt 169.0 lb

## 2021-07-20 DIAGNOSIS — D62 Acute posthemorrhagic anemia: Secondary | ICD-10-CM

## 2021-07-20 DIAGNOSIS — F17210 Nicotine dependence, cigarettes, uncomplicated: Secondary | ICD-10-CM | POA: Diagnosis not present

## 2021-07-20 DIAGNOSIS — Z1211 Encounter for screening for malignant neoplasm of colon: Secondary | ICD-10-CM

## 2021-07-20 DIAGNOSIS — Z89511 Acquired absence of right leg below knee: Secondary | ICD-10-CM

## 2021-07-20 DIAGNOSIS — Z9181 History of falling: Secondary | ICD-10-CM | POA: Diagnosis not present

## 2021-07-20 DIAGNOSIS — Z95828 Presence of other vascular implants and grafts: Secondary | ICD-10-CM | POA: Diagnosis not present

## 2021-07-20 DIAGNOSIS — Z89432 Acquired absence of left foot: Secondary | ICD-10-CM | POA: Diagnosis not present

## 2021-07-20 DIAGNOSIS — Z992 Dependence on renal dialysis: Secondary | ICD-10-CM

## 2021-07-20 DIAGNOSIS — Z794 Long term (current) use of insulin: Secondary | ICD-10-CM

## 2021-07-20 DIAGNOSIS — E785 Hyperlipidemia, unspecified: Secondary | ICD-10-CM | POA: Diagnosis not present

## 2021-07-20 DIAGNOSIS — E1122 Type 2 diabetes mellitus with diabetic chronic kidney disease: Secondary | ICD-10-CM | POA: Diagnosis not present

## 2021-07-20 DIAGNOSIS — D649 Anemia, unspecified: Secondary | ICD-10-CM

## 2021-07-20 DIAGNOSIS — E1140 Type 2 diabetes mellitus with diabetic neuropathy, unspecified: Secondary | ICD-10-CM | POA: Diagnosis not present

## 2021-07-20 DIAGNOSIS — I120 Hypertensive chronic kidney disease with stage 5 chronic kidney disease or end stage renal disease: Secondary | ICD-10-CM

## 2021-07-20 DIAGNOSIS — I509 Heart failure, unspecified: Secondary | ICD-10-CM | POA: Diagnosis not present

## 2021-07-20 DIAGNOSIS — N186 End stage renal disease: Secondary | ICD-10-CM | POA: Diagnosis not present

## 2021-07-20 DIAGNOSIS — R197 Diarrhea, unspecified: Secondary | ICD-10-CM | POA: Diagnosis not present

## 2021-07-20 DIAGNOSIS — K219 Gastro-esophageal reflux disease without esophagitis: Secondary | ICD-10-CM | POA: Diagnosis not present

## 2021-07-20 DIAGNOSIS — G3184 Mild cognitive impairment, so stated: Secondary | ICD-10-CM | POA: Diagnosis not present

## 2021-07-20 DIAGNOSIS — I252 Old myocardial infarction: Secondary | ICD-10-CM | POA: Diagnosis not present

## 2021-07-20 DIAGNOSIS — Z87440 Personal history of urinary (tract) infections: Secondary | ICD-10-CM | POA: Diagnosis not present

## 2021-07-20 DIAGNOSIS — E1142 Type 2 diabetes mellitus with diabetic polyneuropathy: Secondary | ICD-10-CM | POA: Diagnosis not present

## 2021-07-20 DIAGNOSIS — I10 Essential (primary) hypertension: Secondary | ICD-10-CM

## 2021-07-20 DIAGNOSIS — E1151 Type 2 diabetes mellitus with diabetic peripheral angiopathy without gangrene: Secondary | ICD-10-CM | POA: Diagnosis not present

## 2021-07-20 DIAGNOSIS — I700 Atherosclerosis of aorta: Secondary | ICD-10-CM | POA: Diagnosis not present

## 2021-07-20 DIAGNOSIS — F32A Depression, unspecified: Secondary | ICD-10-CM | POA: Diagnosis not present

## 2021-07-20 DIAGNOSIS — I110 Hypertensive heart disease with heart failure: Secondary | ICD-10-CM | POA: Diagnosis not present

## 2021-07-20 DIAGNOSIS — Z7982 Long term (current) use of aspirin: Secondary | ICD-10-CM | POA: Diagnosis not present

## 2021-07-20 LAB — POCT GLYCOSYLATED HEMOGLOBIN (HGB A1C): Hemoglobin A1C: 8.9 % — AB (ref 4.0–5.6)

## 2021-07-20 LAB — GLUCOSE, CAPILLARY: Glucose-Capillary: 167 mg/dL — ABNORMAL HIGH (ref 70–99)

## 2021-07-20 MED ORDER — INSULIN ASPART 100 UNIT/ML FLEXPEN
11.0000 [IU] | PEN_INJECTOR | Freq: Three times a day (TID) | SUBCUTANEOUS | 4 refills | Status: DC
  Filled 2021-07-20: qty 15, 46d supply, fill #0
  Filled 2021-10-14: qty 15, 46d supply, fill #1

## 2021-07-20 MED ORDER — DULOXETINE HCL 30 MG PO CPEP
60.0000 mg | ORAL_CAPSULE | Freq: Every day | ORAL | 1 refills | Status: DC
  Filled 2021-07-20: qty 180, 90d supply, fill #0

## 2021-07-20 MED ORDER — CARVEDILOL 6.25 MG PO TABS
6.2500 mg | ORAL_TABLET | Freq: Two times a day (BID) | ORAL | 1 refills | Status: DC
  Filled 2021-07-20: qty 180, 90d supply, fill #0

## 2021-07-20 MED ORDER — PREGABALIN 25 MG PO CAPS
25.0000 mg | ORAL_CAPSULE | Freq: Every day | ORAL | 1 refills | Status: DC
  Filled 2021-07-20: qty 90, 90d supply, fill #0

## 2021-07-20 MED ORDER — AMLODIPINE BESYLATE 10 MG PO TABS
10.0000 mg | ORAL_TABLET | Freq: Every day | ORAL | 2 refills | Status: DC
  Filled 2021-07-20: qty 90, 90d supply, fill #0

## 2021-07-20 NOTE — Patient Instructions (Signed)
Thank you, Mr.Tyrek Munday for allowing [place] to provide your care today. Today we discussed.  Diarrhea Please make sure you aren't taking senna. If not, please return for a lab only appointment and we will collect a stool sample  Hand and Foot Pain I believe this is from your diabetes neuropathy (nerve damage from your diabetes). We will also check blood work today. We will try lyrica for your pain, please stop the gabapentin.   Foot Wound Please keep covered and follow up with podiatry. If the foot wound gets worse, any pain, drainage, redness, or worsening pain please call the clinic.   You are due for a colonoscopy. Will send referral to GI  I have ordered the following labs for you:  Lab Orders         Glucose, capillary         CBC no Diff         Vitamin B12         POC Hbg A1C       Referrals ordered today:   Referral Orders         Ambulatory referral to Gastroenterology         Ambulatory referral to Podiatry      I have ordered the following medication/changed the following medications:   Stop the following medications: Medications Discontinued During This Encounter  Medication Reason   oxyCODONE-acetaminophen (PERCOCET) 5-325 MG tablet    traMADol (ULTRAM) 50 MG tablet    carvedilol (COREG) 6.25 MG tablet Reorder   DULoxetine (CYMBALTA) 30 MG capsule Reorder   amLODipine (NORVASC) 10 MG tablet Reorder   gabapentin (NEURONTIN) 300 MG capsule      Start the following medications: Meds ordered this encounter  Medications   amLODipine (NORVASC) 10 MG tablet    Sig: Take 1 tablet (10 mg total) by mouth daily.    Dispense:  90 tablet    Refill:  2   carvedilol (COREG) 6.25 MG tablet    Sig: Take 1 tablet (6.25 mg total) by mouth 2 (two) times daily with a meal.    Dispense:  180 tablet    Refill:  1   DULoxetine (CYMBALTA) 30 MG capsule    Sig: Take 2 capsules (60 mg total) by mouth daily.    Dispense:  180 capsule    Refill:  1   pregabalin (LYRICA) 25 MG  capsule    Sig: Take 1 capsule (25 mg total) by mouth daily.    Dispense:  90 capsule    Refill:  1     Follow up: 2-4 week follow up - pain and foot wound and improvement of diarrhea   Should you have any questions or concerns please call the internal medicine clinic at [phone].    Sanjuana Letters, D.O. [place]

## 2021-07-20 NOTE — Telephone Encounter (Signed)
Call from [place] with [facility] .  Patient has been having Chronic diarrhea and has been without his Insulin for a few days since discharge and before. Prescription has been sent to the [facility] to pick up today.   Wants something for if possible.  Patient has Dialysis today at 11;00 AM . To ask doctor at Dialysis  about med for diarrhea.  Also has an appointment in the Clinics today as well.

## 2021-07-21 ENCOUNTER — Telehealth: Payer: Self-pay | Admitting: *Deleted

## 2021-07-21 DIAGNOSIS — D62 Acute posthemorrhagic anemia: Secondary | ICD-10-CM | POA: Diagnosis not present

## 2021-07-21 DIAGNOSIS — E1122 Type 2 diabetes mellitus with diabetic chronic kidney disease: Secondary | ICD-10-CM | POA: Diagnosis not present

## 2021-07-21 DIAGNOSIS — I700 Atherosclerosis of aorta: Secondary | ICD-10-CM | POA: Diagnosis not present

## 2021-07-21 DIAGNOSIS — G3184 Mild cognitive impairment, so stated: Secondary | ICD-10-CM | POA: Diagnosis not present

## 2021-07-21 DIAGNOSIS — Z794 Long term (current) use of insulin: Secondary | ICD-10-CM | POA: Diagnosis not present

## 2021-07-21 DIAGNOSIS — Z7982 Long term (current) use of aspirin: Secondary | ICD-10-CM | POA: Diagnosis not present

## 2021-07-21 DIAGNOSIS — I110 Hypertensive heart disease with heart failure: Secondary | ICD-10-CM | POA: Diagnosis not present

## 2021-07-21 DIAGNOSIS — Z87440 Personal history of urinary (tract) infections: Secondary | ICD-10-CM | POA: Diagnosis not present

## 2021-07-21 DIAGNOSIS — Z992 Dependence on renal dialysis: Secondary | ICD-10-CM | POA: Diagnosis not present

## 2021-07-21 DIAGNOSIS — E1140 Type 2 diabetes mellitus with diabetic neuropathy, unspecified: Secondary | ICD-10-CM | POA: Diagnosis not present

## 2021-07-21 DIAGNOSIS — F32A Depression, unspecified: Secondary | ICD-10-CM | POA: Diagnosis not present

## 2021-07-21 DIAGNOSIS — Z89511 Acquired absence of right leg below knee: Secondary | ICD-10-CM | POA: Diagnosis not present

## 2021-07-21 DIAGNOSIS — Z95828 Presence of other vascular implants and grafts: Secondary | ICD-10-CM | POA: Diagnosis not present

## 2021-07-21 DIAGNOSIS — Z9181 History of falling: Secondary | ICD-10-CM | POA: Diagnosis not present

## 2021-07-21 DIAGNOSIS — N186 End stage renal disease: Secondary | ICD-10-CM | POA: Diagnosis not present

## 2021-07-21 DIAGNOSIS — K219 Gastro-esophageal reflux disease without esophagitis: Secondary | ICD-10-CM | POA: Diagnosis not present

## 2021-07-21 DIAGNOSIS — E1151 Type 2 diabetes mellitus with diabetic peripheral angiopathy without gangrene: Secondary | ICD-10-CM | POA: Diagnosis not present

## 2021-07-21 DIAGNOSIS — E785 Hyperlipidemia, unspecified: Secondary | ICD-10-CM | POA: Diagnosis not present

## 2021-07-21 DIAGNOSIS — I252 Old myocardial infarction: Secondary | ICD-10-CM | POA: Diagnosis not present

## 2021-07-21 DIAGNOSIS — I509 Heart failure, unspecified: Secondary | ICD-10-CM | POA: Diagnosis not present

## 2021-07-21 DIAGNOSIS — R197 Diarrhea, unspecified: Secondary | ICD-10-CM | POA: Insufficient documentation

## 2021-07-21 LAB — CBC
Hematocrit: 31 % — ABNORMAL LOW (ref 37.5–51.0)
Hemoglobin: 10.2 g/dL — ABNORMAL LOW (ref 13.0–17.7)
MCH: 28.6 pg (ref 26.6–33.0)
MCHC: 32.9 g/dL (ref 31.5–35.7)
MCV: 87 fL (ref 79–97)
Platelets: 264 10*3/uL (ref 150–450)
RBC: 3.57 x10E6/uL — ABNORMAL LOW (ref 4.14–5.80)
RDW: 13.1 % (ref 11.6–15.4)
WBC: 10.3 10*3/uL (ref 3.4–10.8)

## 2021-07-21 LAB — VITAMIN B12: Vitamin B-12: 612 pg/mL (ref 232–1245)

## 2021-07-21 NOTE — Assessment & Plan Note (Signed)
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 Currently receiving dialysis through central line MWF. Left upper extremity fistula placed 07/02/21. Patient endorses doing well during sessions. No acute concerns, will continue to follow with [facility].

## 2021-07-21 NOTE — Progress Notes (Signed)
CC: follow up ESRD, T2DM, left limb ulcer  HPI:  [name] is a 61 y.o. male living with a history stated below and presents today for follow up of his chronic medical conditions. Please see problem based assessment and plan for additional details.  Past Medical History:  Diagnosis Date   ESRD on hemodialysis ([place])    Gangrene ([place])    right foot   GERD (gastroesophageal reflux disease)    HFrEF (heart failure with reduced ejection fraction) ([place])    Hyperlipidemia    Hypertension    Neuromuscular disorder ([place])    diabetic neruopathy - hands   Osteomyelitis ([place]) 2010   left foot, s/p midfoot amputation   Osteomyelitis ([place]) 09/2013   RT BKA   Osteomyelitis of ankle or foot 05/2011   rt foot, s/p 5th ray amputation   PAD (peripheral artery disease) ([place])    Pneumonia 2010   SOB (shortness of breath)    uses inhaler prn   Type II diabetes mellitus ([place]) ~ 2002    Current Outpatient Medications on File Prior to Visit  Medication Sig Dispense Refill   Accu-Chek FastClix Lancets MISC Check blood sugar 3 (three) times daily. 102 each 12   Accu-Chek Softclix Lancets lancets Use to check 3 (three) times daily. 100 each 0   ACETAMINOPHEN PO Take 650 mg by mouth daily.     aspirin EC 81 MG tablet Take 1 tablet (81 mg total) by mouth daily. Swallow whole. (Patient not taking: Reported on 07/02/2021) 30 tablet 0   blood glucose meter kit and supplies KIT Dispense based on patient and insurance preference. Use up to four times daily as directed. (FOR ICD-9 250.00, 250.01). 1 each 0   Blood Glucose Monitoring Suppl (ACCU-CHEK AVIVA PLUS) w/Device KIT Check blood sugar 3 times a day 1 kit 0   Blood Glucose Monitoring Suppl (BLOOD GLUCOSE MONITOR SYSTEM) w/Device KIT Use to check blood sugar 3 (three) times daily. 1 kit 0   cetirizine (ZYRTEC ALLERGY) 10 MG tablet Take 1 tablet (10 mg total) by mouth daily. (Patient not taking: Reported on 07/02/2021) 30 tablet 2   Continuous Blood  Gluc Sensor (FREESTYLE LIBRE 2 SENSOR) MISC Place 1 sensor on the skin every 14 days. Use to check glucose continuously 2 each 3   Darbepoetin Alfa (ARANESP) 100 MCG/0.5ML SOSY injection Inject 0.5 mLs (100 mcg total) into the vein every Wednesday with hemodialysis. 4.2 mL    diphenhydrAMINE-Zinc Acetate (BENADRYL ITCH RELIEF STICK) 2-0.1 % STCK Apply 1 Bar topically as needed. 1 Stick 0   ezetimibe (ZETIA) 10 MG tablet Take 1 tablet (10 mg total) by mouth daily. 30 tablet 0   fluticasone (FLONASE) 50 MCG/ACT nasal spray Place 1 spray into both nostrils daily. 16 g 0   glucose blood (ACCU-CHEK AVIVA PLUS) test strip Check blood sugar 3 times a day 100 each 12   glucose blood test strip Use to check 3 (three) times daily. 100 each PRN   insulin glargine-yfgn (SEMGLEE) 100 UNIT/ML Pen Inject 16 Units into the skin at bedtime. 3 mL 3   Insulin Pen Needle 32G X 4 MM MISC Use to inject insulin 4 (four) times daily. 360 each 3   isosorbide mononitrate (IMDUR) 30 MG 24 hr tablet Take 0.5 tablets (15 mg total) by mouth daily. 15 tablet 0   melatonin 3 MG TABS tablet Take 1 tablet (3 mg total) by mouth at bedtime.  0   nicotine (NICODERM CQ - DOSED  IN MG/24 HOURS) 14 mg/24hr patch Place 1 patch (14 mg total) onto the skin daily. 28 patch 0   pantoprazole (PROTONIX) 40 MG tablet Take 1 tablet (40 mg total) by mouth daily. 30 tablet 0   rosuvastatin (CRESTOR) 10 MG tablet Take 1 tablet (10 mg total) by mouth daily. 30 tablet 0   senna-docusate (SENOKOT-S) 8.6-50 MG tablet Take 1 tablet by mouth at bedtime. 12 tablet 0   No current facility-administered medications on file prior to visit.    Family History  Problem Relation Age of Onset   Diabetes Mother    Hypertension Brother    Hypertension Sister    Anesthesia problems Neg Hx    Colon cancer Neg Hx    Rectal cancer Neg Hx    Stomach cancer Neg Hx     Social History   Socioeconomic History   Marital status: Single    Spouse name: Not on file    Number of children: 3   Years of education: 11th   Highest education level: Not on file  Occupational History    Employer: [employer]  Tobacco Use   Smoking status: Every Day    Years: 24.00    Types: Cigarettes    Passive exposure: Never   Smokeless tobacco: Never   Tobacco comments:    STARTED BACK SMOKING 2017. 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, makes chair parts)   Graduated from [employer]; No further school because he had a baby girl   He has 4 children  (17, 67 , 49, 30 as of 2013)   Social Determinants of Radio broadcast assistant Strain: Not on file  Food Insecurity: Not on file  Transportation Needs: No Transportation Needs (02/20/2021)   PRAPARE - Hydrologist (Medical): No    Lack of Transportation (Non-Medical): No  Physical Activity: Not on file  Stress: Not on file  Social Connections: Not on file  Intimate Partner Violence: Not on file    Review of Systems: ROS negative except for what is noted on the assessment and plan.  Vitals:   07/20/21 1539  BP: (!) 156/68  Pulse: 87  Temp: 98.1 F (36.7 C)  TempSrc: Oral  SpO2: 100%  Weight: 169 lb (76.7 kg)  Height: 6' (1.829 m)    Physical Exam: Constitutional: no acute distress HENT: normocephalic atraumatic Eyes: conjunctiva non-erythematous Neck: supple Cardiovascular: regular rate and rhythm, no m/r/g Pulmonary/Chest: normal work of breathing on room air MSK: normal bulk and tone. Dialysis catheter in place. LUE fistula present. 4cm x 4cm ulcer distal portion of left residual  limb Neurological: alert & oriented x 3 Skin: warm and dry Psych: normal mood  Assessment & Plan:   Hypertension, essential Has been non-adherent to his regimen in the past. Current regimen of carvedilol 6.25 mg BID and amlodipine 10 mg daily. Unclear if he is taking his medications correctly, call today to our clinic from [facility] asking for patient's medication list as he is unclear as to what he should  be taking.   We will continue to monitor his blood pressures, hopeful to see improvement with dialysis as well. If continues to be elevated, consider hydralazine. If continue to have difficulty with adherence, could also consider clonidine patch.    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 Currently receiving dialysis through central line MWF. Left upper extremity fistula placed 07/02/21. Patient endorses doing well during sessions. No acute concerns, will continue to follow with [facility].    S/P transmetatarsal amputation of foot, left [facility] Patient presents to clinic wearing tennis shoes without socks or covering over residual limb. Distal and medial aspect of limb with 4 cm x 4cm x .1 cm wound. Denies increased pain or drainage. Low suspicion for active infection but he needs close follow up with podiatry/wound care. Will place referral at this time for wound care management and proper residual limb supplies. Return precautions given.   Addendum: On further chart review, patient instructed to call vascular surgery for urgent appointment in 10/2020 if ulcer developed on left lower extremity. Will ask nursing staff to call patient and discuss him calling vascular surgery for follow up as well.   Diarrhea Patient states he has had episodes of diarrhea since starting dialysis. It occurs 3-4 times a week and on those days, 4 times a day. The stools are formed but loose. Denies watery stools. He denies  taking stool softeners however senna on medication list. Unclear what medications he is taking. Instructed to return to clinic for GI panel if not on any stool softeners at home. Low suspicion for C.diff with description of stool.   Diabetic neuropathy (HCC) Continues to endorse neuropathy in bilateral hands and in left residual limb, not responding to gabapentin. Has had improvement in function of his extremities, but suspect he has worsening damage from poorly controlled diabetes. Will transition from gabapentin to lyrica at 25 mg daily and check B12 levels. Continue exercises at home.   Patient discussed with Dr. Caffie Damme, D.O. [facility], PGY-3 Phone: [phone] Date 07/21/2021 Time 9:06 PM

## 2021-07-21 NOTE — Assessment & Plan Note (Signed)
Continues to endorse neuropathy in bilateral hands and in left residual limb, not responding to gabapentin. Has had improvement in function of his extremities, but suspect he has worsening damage from poorly controlled diabetes. Will transition from gabapentin to lyrica at 25 mg daily and check B12 levels. Continue exercises at home.

## 2021-07-21 NOTE — Telephone Encounter (Signed)
Agree 

## 2021-07-21 NOTE — Assessment & Plan Note (Addendum)
Has been non-adherent to his regimen in the past. Current regimen of carvedilol 6.25 mg BID and amlodipine 10 mg daily. Unclear if he is taking his medications correctly, call today to our clinic from [facility] asking for patient's medication list as he is unclear as to what he should be taking.   We will continue to monitor his blood pressures, hopeful to see improvement with dialysis as well. If continues to be elevated, consider hydralazine. If continue to have difficulty with adherence, could also consider clonidine patch.

## 2021-07-21 NOTE — Assessment & Plan Note (Addendum)
Patient presents to clinic wearing tennis shoes without socks or covering over residual limb. Distal and medial aspect of limb with 4 cm x 4cm x .1 cm wound. Denies increased pain or drainage. Low suspicion for active infection but he needs close follow up with podiatry/wound care. Will place referral at this time for wound care management and proper residual limb supplies. Return precautions given.   Addendum: On further chart review, patient instructed to call vascular surgery for urgent appointment in 10/2020 if ulcer developed on left lower extremity. Will ask nursing staff to call patient and discuss him calling vascular surgery for follow up as well.

## 2021-07-21 NOTE — Assessment & Plan Note (Signed)
Patient states he has had episodes of diarrhea since starting dialysis. It occurs 3-4 times a week and on those days, 4 times a day. The stools are formed but loose. Denies watery stools. He denies taking stool softeners however senna on medication list. Unclear what medications he is taking. Instructed to return to clinic for GI panel if not on any stool softeners at home. Low suspicion for C.diff with description of stool.

## 2021-07-21 NOTE — Telephone Encounter (Signed)
Call from Wetzel Bjornstad with [facility].  Wanted to see what medications patient should be taking.  Has a lot of medications at his home that he is not taking.  Checked off list of medications that patient is currently taking and frequency and took off medications that patient is no longer on.    Request for  [facility] to go out to do medication and Disease education for patient as he does not understanding what will happen if he does not take his medications .

## 2021-07-22 DIAGNOSIS — L89892 Pressure ulcer of other site, stage 2: Secondary | ICD-10-CM | POA: Diagnosis not present

## 2021-07-23 ENCOUNTER — Encounter: Payer: Medicare Other | Admitting: Student

## 2021-07-23 DIAGNOSIS — T8249XD Other complication of vascular dialysis catheter, subsequent encounter: Secondary | ICD-10-CM | POA: Diagnosis not present

## 2021-07-23 DIAGNOSIS — N186 End stage renal disease: Secondary | ICD-10-CM | POA: Diagnosis not present

## 2021-07-23 DIAGNOSIS — N2581 Secondary hyperparathyroidism of renal origin: Secondary | ICD-10-CM | POA: Diagnosis not present

## 2021-07-23 DIAGNOSIS — Z992 Dependence on renal dialysis: Secondary | ICD-10-CM | POA: Diagnosis not present

## 2021-07-27 NOTE — Progress Notes (Signed)
Internal Medicine Clinic Attending  Case discussed with Dr. Katsadouros  At the time of the visit.  We reviewed the resident's history and exam and pertinent patient test results.  I agree with the assessment, diagnosis, and plan of care documented in the resident's note.  

## 2021-07-28 DIAGNOSIS — I509 Heart failure, unspecified: Secondary | ICD-10-CM | POA: Diagnosis not present

## 2021-07-28 DIAGNOSIS — E785 Hyperlipidemia, unspecified: Secondary | ICD-10-CM | POA: Diagnosis not present

## 2021-07-28 DIAGNOSIS — E1151 Type 2 diabetes mellitus with diabetic peripheral angiopathy without gangrene: Secondary | ICD-10-CM | POA: Diagnosis not present

## 2021-07-28 DIAGNOSIS — E1140 Type 2 diabetes mellitus with diabetic neuropathy, unspecified: Secondary | ICD-10-CM | POA: Diagnosis not present

## 2021-07-28 DIAGNOSIS — D62 Acute posthemorrhagic anemia: Secondary | ICD-10-CM | POA: Diagnosis not present

## 2021-07-28 DIAGNOSIS — Z9181 History of falling: Secondary | ICD-10-CM | POA: Diagnosis not present

## 2021-07-28 DIAGNOSIS — Z992 Dependence on renal dialysis: Secondary | ICD-10-CM | POA: Diagnosis not present

## 2021-07-28 DIAGNOSIS — Z89511 Acquired absence of right leg below knee: Secondary | ICD-10-CM | POA: Diagnosis not present

## 2021-07-28 DIAGNOSIS — I110 Hypertensive heart disease with heart failure: Secondary | ICD-10-CM | POA: Diagnosis not present

## 2021-07-28 DIAGNOSIS — F32A Depression, unspecified: Secondary | ICD-10-CM | POA: Diagnosis not present

## 2021-07-28 DIAGNOSIS — I700 Atherosclerosis of aorta: Secondary | ICD-10-CM | POA: Diagnosis not present

## 2021-07-28 DIAGNOSIS — Z7982 Long term (current) use of aspirin: Secondary | ICD-10-CM | POA: Diagnosis not present

## 2021-07-28 DIAGNOSIS — Z794 Long term (current) use of insulin: Secondary | ICD-10-CM | POA: Diagnosis not present

## 2021-07-28 DIAGNOSIS — Z95828 Presence of other vascular implants and grafts: Secondary | ICD-10-CM | POA: Diagnosis not present

## 2021-07-28 DIAGNOSIS — Z87440 Personal history of urinary (tract) infections: Secondary | ICD-10-CM | POA: Diagnosis not present

## 2021-07-28 DIAGNOSIS — N186 End stage renal disease: Secondary | ICD-10-CM | POA: Diagnosis not present

## 2021-07-28 DIAGNOSIS — I252 Old myocardial infarction: Secondary | ICD-10-CM | POA: Diagnosis not present

## 2021-07-28 DIAGNOSIS — G3184 Mild cognitive impairment, so stated: Secondary | ICD-10-CM | POA: Diagnosis not present

## 2021-07-28 DIAGNOSIS — K219 Gastro-esophageal reflux disease without esophagitis: Secondary | ICD-10-CM | POA: Diagnosis not present

## 2021-07-28 DIAGNOSIS — E1122 Type 2 diabetes mellitus with diabetic chronic kidney disease: Secondary | ICD-10-CM | POA: Diagnosis not present

## 2021-07-29 ENCOUNTER — Encounter: Payer: Self-pay | Admitting: Podiatry

## 2021-07-29 ENCOUNTER — Other Ambulatory Visit: Payer: Self-pay | Admitting: *Deleted

## 2021-07-29 DIAGNOSIS — N186 End stage renal disease: Secondary | ICD-10-CM

## 2021-07-30 DIAGNOSIS — Z992 Dependence on renal dialysis: Secondary | ICD-10-CM | POA: Diagnosis not present

## 2021-07-30 DIAGNOSIS — I110 Hypertensive heart disease with heart failure: Secondary | ICD-10-CM | POA: Diagnosis not present

## 2021-07-30 DIAGNOSIS — I700 Atherosclerosis of aorta: Secondary | ICD-10-CM | POA: Diagnosis not present

## 2021-07-30 DIAGNOSIS — Z87440 Personal history of urinary (tract) infections: Secondary | ICD-10-CM | POA: Diagnosis not present

## 2021-07-30 DIAGNOSIS — E785 Hyperlipidemia, unspecified: Secondary | ICD-10-CM | POA: Diagnosis not present

## 2021-07-30 DIAGNOSIS — E1140 Type 2 diabetes mellitus with diabetic neuropathy, unspecified: Secondary | ICD-10-CM | POA: Diagnosis not present

## 2021-07-30 DIAGNOSIS — Z9181 History of falling: Secondary | ICD-10-CM | POA: Diagnosis not present

## 2021-07-30 DIAGNOSIS — F32A Depression, unspecified: Secondary | ICD-10-CM | POA: Diagnosis not present

## 2021-07-30 DIAGNOSIS — Z7982 Long term (current) use of aspirin: Secondary | ICD-10-CM | POA: Diagnosis not present

## 2021-07-30 DIAGNOSIS — E1151 Type 2 diabetes mellitus with diabetic peripheral angiopathy without gangrene: Secondary | ICD-10-CM | POA: Diagnosis not present

## 2021-07-30 DIAGNOSIS — I509 Heart failure, unspecified: Secondary | ICD-10-CM | POA: Diagnosis not present

## 2021-07-30 DIAGNOSIS — E1122 Type 2 diabetes mellitus with diabetic chronic kidney disease: Secondary | ICD-10-CM | POA: Diagnosis not present

## 2021-07-30 DIAGNOSIS — Z794 Long term (current) use of insulin: Secondary | ICD-10-CM | POA: Diagnosis not present

## 2021-07-30 DIAGNOSIS — Z95828 Presence of other vascular implants and grafts: Secondary | ICD-10-CM | POA: Diagnosis not present

## 2021-07-30 DIAGNOSIS — I252 Old myocardial infarction: Secondary | ICD-10-CM | POA: Diagnosis not present

## 2021-07-30 DIAGNOSIS — N186 End stage renal disease: Secondary | ICD-10-CM | POA: Diagnosis not present

## 2021-07-30 DIAGNOSIS — D62 Acute posthemorrhagic anemia: Secondary | ICD-10-CM | POA: Diagnosis not present

## 2021-07-30 DIAGNOSIS — G3184 Mild cognitive impairment, so stated: Secondary | ICD-10-CM | POA: Diagnosis not present

## 2021-07-30 DIAGNOSIS — K219 Gastro-esophageal reflux disease without esophagitis: Secondary | ICD-10-CM | POA: Diagnosis not present

## 2021-07-30 DIAGNOSIS — Z89511 Acquired absence of right leg below knee: Secondary | ICD-10-CM | POA: Diagnosis not present

## 2021-08-03 DIAGNOSIS — Z992 Dependence on renal dialysis: Secondary | ICD-10-CM | POA: Diagnosis not present

## 2021-08-03 DIAGNOSIS — E1122 Type 2 diabetes mellitus with diabetic chronic kidney disease: Secondary | ICD-10-CM | POA: Diagnosis not present

## 2021-08-03 DIAGNOSIS — N186 End stage renal disease: Secondary | ICD-10-CM | POA: Diagnosis not present

## 2021-08-05 ENCOUNTER — Other Ambulatory Visit (HOSPITAL_COMMUNITY): Payer: Self-pay

## 2021-08-05 DIAGNOSIS — Z992 Dependence on renal dialysis: Secondary | ICD-10-CM | POA: Diagnosis not present

## 2021-08-05 DIAGNOSIS — N2581 Secondary hyperparathyroidism of renal origin: Secondary | ICD-10-CM | POA: Diagnosis not present

## 2021-08-05 DIAGNOSIS — T8249XD Other complication of vascular dialysis catheter, subsequent encounter: Secondary | ICD-10-CM | POA: Diagnosis not present

## 2021-08-05 DIAGNOSIS — N186 End stage renal disease: Secondary | ICD-10-CM | POA: Diagnosis not present

## 2021-08-05 MED ORDER — PANTOPRAZOLE SODIUM 40 MG PO TBEC
40.0000 mg | DELAYED_RELEASE_TABLET | Freq: Every day | ORAL | 11 refills | Status: DC
Start: 2021-08-05 — End: 2022-05-18
  Filled 2021-08-05 – 2021-10-14 (×2): qty 30, 30d supply, fill #0

## 2021-08-06 DIAGNOSIS — Z89511 Acquired absence of right leg below knee: Secondary | ICD-10-CM | POA: Diagnosis not present

## 2021-08-06 DIAGNOSIS — I700 Atherosclerosis of aorta: Secondary | ICD-10-CM | POA: Diagnosis not present

## 2021-08-06 DIAGNOSIS — E1122 Type 2 diabetes mellitus with diabetic chronic kidney disease: Secondary | ICD-10-CM | POA: Diagnosis not present

## 2021-08-06 DIAGNOSIS — I252 Old myocardial infarction: Secondary | ICD-10-CM | POA: Diagnosis not present

## 2021-08-06 DIAGNOSIS — E1140 Type 2 diabetes mellitus with diabetic neuropathy, unspecified: Secondary | ICD-10-CM | POA: Diagnosis not present

## 2021-08-06 DIAGNOSIS — G3184 Mild cognitive impairment, so stated: Secondary | ICD-10-CM | POA: Diagnosis not present

## 2021-08-06 DIAGNOSIS — D62 Acute posthemorrhagic anemia: Secondary | ICD-10-CM | POA: Diagnosis not present

## 2021-08-06 DIAGNOSIS — I110 Hypertensive heart disease with heart failure: Secondary | ICD-10-CM | POA: Diagnosis not present

## 2021-08-06 DIAGNOSIS — N186 End stage renal disease: Secondary | ICD-10-CM | POA: Diagnosis not present

## 2021-08-06 DIAGNOSIS — Z95828 Presence of other vascular implants and grafts: Secondary | ICD-10-CM | POA: Diagnosis not present

## 2021-08-06 DIAGNOSIS — Z9181 History of falling: Secondary | ICD-10-CM | POA: Diagnosis not present

## 2021-08-06 DIAGNOSIS — K219 Gastro-esophageal reflux disease without esophagitis: Secondary | ICD-10-CM | POA: Diagnosis not present

## 2021-08-06 DIAGNOSIS — Z7982 Long term (current) use of aspirin: Secondary | ICD-10-CM | POA: Diagnosis not present

## 2021-08-06 DIAGNOSIS — Z87440 Personal history of urinary (tract) infections: Secondary | ICD-10-CM | POA: Diagnosis not present

## 2021-08-06 DIAGNOSIS — Z794 Long term (current) use of insulin: Secondary | ICD-10-CM | POA: Diagnosis not present

## 2021-08-06 DIAGNOSIS — F32A Depression, unspecified: Secondary | ICD-10-CM | POA: Diagnosis not present

## 2021-08-06 DIAGNOSIS — I509 Heart failure, unspecified: Secondary | ICD-10-CM | POA: Diagnosis not present

## 2021-08-06 DIAGNOSIS — E1151 Type 2 diabetes mellitus with diabetic peripheral angiopathy without gangrene: Secondary | ICD-10-CM | POA: Diagnosis not present

## 2021-08-06 DIAGNOSIS — Z992 Dependence on renal dialysis: Secondary | ICD-10-CM | POA: Diagnosis not present

## 2021-08-06 DIAGNOSIS — E785 Hyperlipidemia, unspecified: Secondary | ICD-10-CM | POA: Diagnosis not present

## 2021-08-07 DIAGNOSIS — Z87440 Personal history of urinary (tract) infections: Secondary | ICD-10-CM | POA: Diagnosis not present

## 2021-08-07 DIAGNOSIS — T8249XD Other complication of vascular dialysis catheter, subsequent encounter: Secondary | ICD-10-CM | POA: Diagnosis not present

## 2021-08-07 DIAGNOSIS — Z9181 History of falling: Secondary | ICD-10-CM | POA: Diagnosis not present

## 2021-08-07 DIAGNOSIS — N186 End stage renal disease: Secondary | ICD-10-CM | POA: Diagnosis not present

## 2021-08-07 DIAGNOSIS — Z992 Dependence on renal dialysis: Secondary | ICD-10-CM | POA: Diagnosis not present

## 2021-08-07 DIAGNOSIS — Z7982 Long term (current) use of aspirin: Secondary | ICD-10-CM | POA: Diagnosis not present

## 2021-08-07 DIAGNOSIS — I252 Old myocardial infarction: Secondary | ICD-10-CM | POA: Diagnosis not present

## 2021-08-07 DIAGNOSIS — K219 Gastro-esophageal reflux disease without esophagitis: Secondary | ICD-10-CM | POA: Diagnosis not present

## 2021-08-07 DIAGNOSIS — I700 Atherosclerosis of aorta: Secondary | ICD-10-CM | POA: Diagnosis not present

## 2021-08-07 DIAGNOSIS — E785 Hyperlipidemia, unspecified: Secondary | ICD-10-CM | POA: Diagnosis not present

## 2021-08-07 DIAGNOSIS — N2581 Secondary hyperparathyroidism of renal origin: Secondary | ICD-10-CM | POA: Diagnosis not present

## 2021-08-07 DIAGNOSIS — E1151 Type 2 diabetes mellitus with diabetic peripheral angiopathy without gangrene: Secondary | ICD-10-CM | POA: Diagnosis not present

## 2021-08-07 DIAGNOSIS — Z95828 Presence of other vascular implants and grafts: Secondary | ICD-10-CM | POA: Diagnosis not present

## 2021-08-07 DIAGNOSIS — Z794 Long term (current) use of insulin: Secondary | ICD-10-CM | POA: Diagnosis not present

## 2021-08-07 DIAGNOSIS — Z89511 Acquired absence of right leg below knee: Secondary | ICD-10-CM | POA: Diagnosis not present

## 2021-08-07 DIAGNOSIS — F32A Depression, unspecified: Secondary | ICD-10-CM | POA: Diagnosis not present

## 2021-08-07 DIAGNOSIS — I509 Heart failure, unspecified: Secondary | ICD-10-CM | POA: Diagnosis not present

## 2021-08-07 DIAGNOSIS — I110 Hypertensive heart disease with heart failure: Secondary | ICD-10-CM | POA: Diagnosis not present

## 2021-08-07 DIAGNOSIS — G3184 Mild cognitive impairment, so stated: Secondary | ICD-10-CM | POA: Diagnosis not present

## 2021-08-07 DIAGNOSIS — E1140 Type 2 diabetes mellitus with diabetic neuropathy, unspecified: Secondary | ICD-10-CM | POA: Diagnosis not present

## 2021-08-07 DIAGNOSIS — E1122 Type 2 diabetes mellitus with diabetic chronic kidney disease: Secondary | ICD-10-CM | POA: Diagnosis not present

## 2021-08-07 DIAGNOSIS — D62 Acute posthemorrhagic anemia: Secondary | ICD-10-CM | POA: Diagnosis not present

## 2021-08-10 ENCOUNTER — Inpatient Hospital Stay (HOSPITAL_COMMUNITY): Payer: Medicare Other

## 2021-08-10 ENCOUNTER — Inpatient Hospital Stay (HOSPITAL_COMMUNITY)
Admission: EM | Admit: 2021-08-10 | Discharge: 2021-08-20 | DRG: 239 | Disposition: A | Discharge status: Skilled Nursing Facility | Payer: Medicare Other | Attending: Internal Medicine | Admitting: Internal Medicine

## 2021-08-10 ENCOUNTER — Emergency Department (HOSPITAL_COMMUNITY): Payer: Medicare Other

## 2021-08-10 DIAGNOSIS — I132 Hypertensive heart and chronic kidney disease with heart failure and with stage 5 chronic kidney disease, or end stage renal disease: Secondary | ICD-10-CM | POA: Diagnosis present

## 2021-08-10 DIAGNOSIS — E1140 Type 2 diabetes mellitus with diabetic neuropathy, unspecified: Secondary | ICD-10-CM | POA: Diagnosis not present

## 2021-08-10 DIAGNOSIS — E1129 Type 2 diabetes mellitus with other diabetic kidney complication: Secondary | ICD-10-CM | POA: Diagnosis not present

## 2021-08-10 DIAGNOSIS — I7025 Atherosclerosis of native arteries of other extremities with ulceration: Secondary | ICD-10-CM | POA: Diagnosis not present

## 2021-08-10 DIAGNOSIS — M6281 Muscle weakness (generalized): Secondary | ICD-10-CM | POA: Diagnosis not present

## 2021-08-10 DIAGNOSIS — E1122 Type 2 diabetes mellitus with diabetic chronic kidney disease: Secondary | ICD-10-CM | POA: Diagnosis present

## 2021-08-10 DIAGNOSIS — Z992 Dependence on renal dialysis: Secondary | ICD-10-CM

## 2021-08-10 DIAGNOSIS — I5022 Chronic systolic (congestive) heart failure: Secondary | ICD-10-CM | POA: Diagnosis not present

## 2021-08-10 DIAGNOSIS — I96 Gangrene, not elsewhere classified: Secondary | ICD-10-CM | POA: Diagnosis not present

## 2021-08-10 DIAGNOSIS — F17210 Nicotine dependence, cigarettes, uncomplicated: Secondary | ICD-10-CM | POA: Diagnosis present

## 2021-08-10 DIAGNOSIS — E785 Hyperlipidemia, unspecified: Secondary | ICD-10-CM | POA: Diagnosis present

## 2021-08-10 DIAGNOSIS — E119 Type 2 diabetes mellitus without complications: Secondary | ICD-10-CM | POA: Diagnosis not present

## 2021-08-10 DIAGNOSIS — Z7401 Bed confinement status: Secondary | ICD-10-CM | POA: Diagnosis not present

## 2021-08-10 DIAGNOSIS — D649 Anemia, unspecified: Secondary | ICD-10-CM | POA: Diagnosis present

## 2021-08-10 DIAGNOSIS — N189 Chronic kidney disease, unspecified: Secondary | ICD-10-CM | POA: Diagnosis not present

## 2021-08-10 DIAGNOSIS — N250 Renal osteodystrophy: Secondary | ICD-10-CM | POA: Diagnosis not present

## 2021-08-10 DIAGNOSIS — I252 Old myocardial infarction: Secondary | ICD-10-CM | POA: Diagnosis not present

## 2021-08-10 DIAGNOSIS — Y832 Surgical operation with anastomosis, bypass or graft as the cause of abnormal reaction of the patient, or of later complication, without mention of misadventure at the time of the procedure: Secondary | ICD-10-CM | POA: Diagnosis present

## 2021-08-10 DIAGNOSIS — K219 Gastro-esophageal reflux disease without esophagitis: Secondary | ICD-10-CM | POA: Diagnosis not present

## 2021-08-10 DIAGNOSIS — Z91158 Patient's noncompliance with renal dialysis for other reason: Secondary | ICD-10-CM

## 2021-08-10 DIAGNOSIS — Z89612 Acquired absence of left leg above knee: Secondary | ICD-10-CM | POA: Diagnosis not present

## 2021-08-10 DIAGNOSIS — Z8249 Family history of ischemic heart disease and other diseases of the circulatory system: Secondary | ICD-10-CM

## 2021-08-10 DIAGNOSIS — E1151 Type 2 diabetes mellitus with diabetic peripheral angiopathy without gangrene: Secondary | ICD-10-CM

## 2021-08-10 DIAGNOSIS — E1165 Type 2 diabetes mellitus with hyperglycemia: Secondary | ICD-10-CM | POA: Diagnosis present

## 2021-08-10 DIAGNOSIS — R918 Other nonspecific abnormal finding of lung field: Secondary | ICD-10-CM | POA: Diagnosis not present

## 2021-08-10 DIAGNOSIS — N2581 Secondary hyperparathyroidism of renal origin: Secondary | ICD-10-CM | POA: Diagnosis not present

## 2021-08-10 DIAGNOSIS — T82898A Other specified complication of vascular prosthetic devices, implants and grafts, initial encounter: Secondary | ICD-10-CM | POA: Diagnosis present

## 2021-08-10 DIAGNOSIS — K661 Hemoperitoneum: Secondary | ICD-10-CM | POA: Diagnosis not present

## 2021-08-10 DIAGNOSIS — I120 Hypertensive chronic kidney disease with stage 5 chronic kidney disease or end stage renal disease: Secondary | ICD-10-CM | POA: Diagnosis not present

## 2021-08-10 DIAGNOSIS — I959 Hypotension, unspecified: Secondary | ICD-10-CM | POA: Diagnosis not present

## 2021-08-10 DIAGNOSIS — Z89432 Acquired absence of left foot: Secondary | ICD-10-CM

## 2021-08-10 DIAGNOSIS — I70262 Atherosclerosis of native arteries of extremities with gangrene, left leg: Secondary | ICD-10-CM | POA: Diagnosis present

## 2021-08-10 DIAGNOSIS — F17200 Nicotine dependence, unspecified, uncomplicated: Secondary | ICD-10-CM | POA: Diagnosis not present

## 2021-08-10 DIAGNOSIS — I361 Nonrheumatic tricuspid (valve) insufficiency: Secondary | ICD-10-CM | POA: Diagnosis not present

## 2021-08-10 DIAGNOSIS — Z833 Family history of diabetes mellitus: Secondary | ICD-10-CM

## 2021-08-10 DIAGNOSIS — Z794 Long term (current) use of insulin: Secondary | ICD-10-CM

## 2021-08-10 DIAGNOSIS — I2510 Atherosclerotic heart disease of native coronary artery without angina pectoris: Secondary | ICD-10-CM | POA: Diagnosis not present

## 2021-08-10 DIAGNOSIS — Z89511 Acquired absence of right leg below knee: Secondary | ICD-10-CM

## 2021-08-10 DIAGNOSIS — Z79899 Other long term (current) drug therapy: Secondary | ICD-10-CM

## 2021-08-10 DIAGNOSIS — T8743 Infection of amputation stump, right lower extremity: Secondary | ICD-10-CM | POA: Diagnosis not present

## 2021-08-10 DIAGNOSIS — R531 Weakness: Secondary | ICD-10-CM | POA: Diagnosis not present

## 2021-08-10 DIAGNOSIS — R1311 Dysphagia, oral phase: Secondary | ICD-10-CM | POA: Diagnosis not present

## 2021-08-10 DIAGNOSIS — L0390 Cellulitis, unspecified: Secondary | ICD-10-CM | POA: Diagnosis not present

## 2021-08-10 DIAGNOSIS — E1152 Type 2 diabetes mellitus with diabetic peripheral angiopathy with gangrene: Secondary | ICD-10-CM | POA: Diagnosis not present

## 2021-08-10 DIAGNOSIS — I340 Nonrheumatic mitral (valve) insufficiency: Secondary | ICD-10-CM | POA: Diagnosis not present

## 2021-08-10 DIAGNOSIS — T8744 Infection of amputation stump, left lower extremity: Secondary | ICD-10-CM | POA: Diagnosis not present

## 2021-08-10 DIAGNOSIS — D62 Acute posthemorrhagic anemia: Secondary | ICD-10-CM | POA: Diagnosis not present

## 2021-08-10 DIAGNOSIS — M7989 Other specified soft tissue disorders: Secondary | ICD-10-CM | POA: Diagnosis not present

## 2021-08-10 DIAGNOSIS — L97929 Non-pressure chronic ulcer of unspecified part of left lower leg with unspecified severity: Secondary | ICD-10-CM | POA: Diagnosis not present

## 2021-08-10 DIAGNOSIS — N186 End stage renal disease: Secondary | ICD-10-CM | POA: Diagnosis not present

## 2021-08-10 DIAGNOSIS — D631 Anemia in chronic kidney disease: Secondary | ICD-10-CM | POA: Diagnosis not present

## 2021-08-10 DIAGNOSIS — L03116 Cellulitis of left lower limb: Secondary | ICD-10-CM | POA: Diagnosis not present

## 2021-08-10 DIAGNOSIS — I739 Peripheral vascular disease, unspecified: Secondary | ICD-10-CM | POA: Diagnosis not present

## 2021-08-10 DIAGNOSIS — F32A Depression, unspecified: Secondary | ICD-10-CM | POA: Diagnosis not present

## 2021-08-10 DIAGNOSIS — R000 Tachycardia, unspecified: Secondary | ICD-10-CM | POA: Diagnosis not present

## 2021-08-10 DIAGNOSIS — I509 Heart failure, unspecified: Secondary | ICD-10-CM | POA: Diagnosis not present

## 2021-08-10 HISTORY — DX: Hemoperitoneum: K66.1

## 2021-08-10 HISTORY — DX: Retroperitoneal hematoma: K68.3

## 2021-08-10 HISTORY — DX: Infection of amputation stump, left lower extremity: T87.44

## 2021-08-10 LAB — CBC
HCT: 33.2 % — ABNORMAL LOW (ref 39.0–52.0)
Hemoglobin: 10.6 g/dL — ABNORMAL LOW (ref 13.0–17.0)
MCH: 28.3 pg (ref 26.0–34.0)
MCHC: 31.9 g/dL (ref 30.0–36.0)
MCV: 88.8 fL (ref 80.0–100.0)
Platelets: 224 10*3/uL (ref 150–400)
RBC: 3.74 MIL/uL — ABNORMAL LOW (ref 4.22–5.81)
RDW: 13.4 % (ref 11.5–15.5)
WBC: 21.2 10*3/uL — ABNORMAL HIGH (ref 4.0–10.5)
nRBC: 0.1 % (ref 0.0–0.2)

## 2021-08-10 LAB — HEPATIC FUNCTION PANEL
ALT: 12 U/L (ref 0–44)
AST: 14 U/L — ABNORMAL LOW (ref 15–41)
Albumin: 2.9 g/dL — ABNORMAL LOW (ref 3.5–5.0)
Alkaline Phosphatase: 87 U/L (ref 38–126)
Bilirubin, Direct: 0.5 mg/dL — ABNORMAL HIGH (ref 0.0–0.2)
Indirect Bilirubin: 1 mg/dL — ABNORMAL HIGH (ref 0.3–0.9)
Total Bilirubin: 1.5 mg/dL — ABNORMAL HIGH (ref 0.3–1.2)
Total Protein: 8.1 g/dL (ref 6.5–8.1)

## 2021-08-10 LAB — TROPONIN I (HIGH SENSITIVITY)
Troponin I (High Sensitivity): 29 ng/L — ABNORMAL HIGH (ref <18)
Troponin I (High Sensitivity): 28 ng/L — ABNORMAL HIGH (ref <18)

## 2021-08-10 LAB — BASIC METABOLIC PANEL
Anion gap: 17 — ABNORMAL HIGH (ref 5–15)
BUN: 20 mg/dL (ref 8–23)
CO2: 22 mmol/L (ref 22–32)
Calcium: 8.4 mg/dL — ABNORMAL LOW (ref 8.9–10.3)
Chloride: 86 mmol/L — ABNORMAL LOW (ref 98–111)
Creatinine, Ser: 4.16 mg/dL — ABNORMAL HIGH (ref 0.61–1.24)
GFR, Estimated: 15 mL/min — ABNORMAL LOW (ref >60)
Glucose, Bld: 227 mg/dL — ABNORMAL HIGH (ref 70–99)
Potassium: 3.5 mmol/L (ref 3.5–5.1)
Sodium: 125 mmol/L — ABNORMAL LOW (ref 135–145)

## 2021-08-10 LAB — C-REACTIVE PROTEIN: CRP: 24.9 mg/dL — ABNORMAL HIGH (ref <1.0)

## 2021-08-10 LAB — GLUCOSE, CAPILLARY: Glucose-Capillary: 154 mg/dL — ABNORMAL HIGH (ref 70–99)

## 2021-08-10 LAB — LACTIC ACID, PLASMA
Lactic Acid, Venous: 1.4 mmol/L (ref 0.5–1.9)
Lactic Acid, Venous: 1.8 mmol/L (ref 0.5–1.9)

## 2021-08-10 LAB — SEDIMENTATION RATE: Sed Rate: 100 mm/hr — ABNORMAL HIGH (ref 0–16)

## 2021-08-10 MED ORDER — VANCOMYCIN HCL 1500 MG/300ML IV SOLN
1500.0000 mg | Freq: Once | INTRAVENOUS | Status: AC
  Administered 2021-08-10: 1500 mg via INTRAVENOUS
  Filled 2021-08-10: qty 300

## 2021-08-10 MED ORDER — SODIUM CHLORIDE 0.9 % IV SOLN
1.0000 g | INTRAVENOUS | Status: DC
  Administered 2021-08-12: 1 g via INTRAVENOUS
  Filled 2021-08-10 (×3): qty 10

## 2021-08-10 MED ORDER — HYDROMORPHONE HCL 1 MG/ML IJ SOLN
0.5000 mg | Freq: Once | INTRAMUSCULAR | Status: AC | PRN
  Administered 2021-08-10: 0.5 mg via INTRAVENOUS
  Filled 2021-08-10: qty 0.5

## 2021-08-10 MED ORDER — VANCOMYCIN VARIABLE DOSE PER UNSTABLE RENAL FUNCTION (PHARMACIST DOSING): Status: DC

## 2021-08-10 MED ORDER — ORAL CARE MOUTH RINSE
15.0000 mL | OROMUCOSAL | Status: DC | PRN
Start: 2021-08-10 — End: 2021-08-21

## 2021-08-10 MED ORDER — HEPARIN SODIUM (PORCINE) 5000 UNIT/ML IJ SOLN
5000.0000 [IU] | Freq: Three times a day (TID) | INTRAMUSCULAR | Status: DC
  Administered 2021-08-10 – 2021-08-20 (×24): 5000 [IU] via SUBCUTANEOUS
  Filled 2021-08-10 (×25): qty 1

## 2021-08-10 MED ORDER — CLINDAMYCIN HCL 150 MG PO CAPS
600.0000 mg | ORAL_CAPSULE | Freq: Once | ORAL | Status: AC
  Administered 2021-08-10: 600 mg via ORAL
  Filled 2021-08-10: qty 4

## 2021-08-10 MED ORDER — LACTATED RINGERS IV BOLUS (SEPSIS)
500.0000 mL | Freq: Once | INTRAVENOUS | Status: AC
  Administered 2021-08-10: 500 mL via INTRAVENOUS

## 2021-08-10 MED ORDER — SODIUM CHLORIDE 0.9 % IV SOLN
INTRAVENOUS | Status: DC
Start: 2021-08-10 — End: 2021-08-11

## 2021-08-10 MED ORDER — INSULIN ASPART 100 UNIT/ML IJ SOLN: 0.0000 [IU] | Freq: Every day | INTRAMUSCULAR | Status: DC

## 2021-08-10 MED ORDER — CHLORHEXIDINE GLUCONATE CLOTH 2 % EX PADS
6.0000 | MEDICATED_PAD | Freq: Every day | CUTANEOUS | Status: DC
  Administered 2021-08-11: 6 via TOPICAL

## 2021-08-10 MED ORDER — INSULIN ASPART 100 UNIT/ML IJ SOLN
0.0000 [IU] | Freq: Three times a day (TID) | INTRAMUSCULAR | Status: DC
  Administered 2021-08-11 – 2021-08-14 (×4): 1 [IU] via SUBCUTANEOUS
  Administered 2021-08-14: 2 [IU] via SUBCUTANEOUS
  Administered 2021-08-15 – 2021-08-17 (×5): 1 [IU] via SUBCUTANEOUS
  Administered 2021-08-17: 2 [IU] via SUBCUTANEOUS
  Administered 2021-08-19 – 2021-08-20 (×3): 1 [IU] via SUBCUTANEOUS

## 2021-08-10 MED ORDER — SODIUM CHLORIDE 0.9 % IV SOLN
1.0000 g | Freq: Once | INTRAVENOUS | Status: AC
  Administered 2021-08-10: 1 g via INTRAVENOUS
  Filled 2021-08-10: qty 10

## 2021-08-10 NOTE — ED Notes (Signed)
Got patient on the monitor got patient some warm blankets patient is resting with call bell in reach  ?

## 2021-08-10 NOTE — ED Notes (Signed)
Call from MRI: Pt not tolerating the machine. Call to admit team for medications .
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
## 2021-08-10 NOTE — ED Notes (Signed)
Patient transported to X-ray 

## 2021-08-10 NOTE — ED Notes (Addendum)
Patient return  transported to CT
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
## 2021-08-10 NOTE — ED Provider Notes (Addendum)
[facility] EMERGENCY DEPARTMENT Provider Note   CSN: [id number] Arrival date & time: 08/10/21  0234     History  Chief Complaint  Patient presents with   Weakness    [name] is a 61 y.o. male.   Weakness  Patient presents for fatigue.  Medical history includes PVD, HLD, HTN, GERD, neuropathy, right BKA, left transmetatarsal amputation, cognitive impairment with memory loss, ESRD on HD (M, W, F), T2DM, CAD, CHF.  Patient reports that he has been experiencing fatigue for several weeks.  He denies any areas of pain.  He describes symptoms of "reflux".  He denies any shortness of breath.  His last dialysis session was on Friday.  Per chart review, patient underwent left transmetatarsal amputation with Dr. Sharol Given in 2014.    Home Medications Prior to Admission medications   Medication Sig Start Date End Date Taking? Authorizing Provider  Accu-Chek FastClix Lancets MISC Check blood sugar 3 (three) times daily. 06/04/21   Medina-Vargas, Monina C, NP  Accu-Chek Softclix Lancets lancets Use to check 3 (three) times daily. 06/08/21     amLODipine (NORVASC) 10 MG tablet Take 1 tablet (10 mg total) by mouth daily. Patient not taking: Reported on 08/10/2021 07/20/21   [name], MD  aspirin EC 81 MG tablet Take 1 tablet (81 mg total) by mouth daily. Swallow whole. Patient not taking: Reported on 07/02/2021 06/03/21   Medina-Vargas, Monina C, NP  blood glucose meter kit and supplies KIT Dispense based on patient and insurance preference. Use up to four times daily as directed. (FOR ICD-9 250.00, 250.01). 04/16/15   Norman Herrlich, MD  Blood Glucose Monitoring Suppl (ACCU-CHEK AVIVA PLUS) w/Device KIT Check blood sugar 3 times a day 12/19/15   Sid Falcon, MD  Blood Glucose Monitoring Suppl (BLOOD GLUCOSE MONITOR SYSTEM) w/Device KIT Use to check blood sugar 3 (three) times daily. 06/08/21   Medina-Vargas, Monina C, NP  carvedilol (COREG) 6.25 MG tablet Take 1 tablet  (6.25 mg total) by mouth 2 (two) times daily with a meal. Patient not taking: Reported on 08/10/2021 07/20/21   [name], MD  cetirizine (ZYRTEC ALLERGY) 10 MG tablet Take 1 tablet (10 mg total) by mouth daily. Patient not taking: Reported on 07/02/2021 10/20/20   Harvie Heck, MD  Continuous Blood Gluc Sensor (FREESTYLE LIBRE 2 SENSOR) MISC Place 1 sensor on the skin every 14 days. Use to check glucose continuously 02/05/21   [name], MD  Darbepoetin Alfa (ARANESP) 100 MCG/0.5ML SOSY injection Inject 0.5 mLs (100 mcg total) into the vein every Wednesday with hemodialysis. 05/20/21   Orvis Brill, MD  diphenhydrAMINE-Zinc Acetate (BENADRYL ITCH RELIEF STICK) 2-0.1 % STCK Apply 1 Bar topically as needed. 09/17/20   Delene Ruffini, MD  DULoxetine (CYMBALTA) 30 MG capsule Take 2 capsules (60 mg total) by mouth daily. Patient not taking: Reported on 08/10/2021 07/20/21   [name], MD  ezetimibe (ZETIA) 10 MG tablet Take 1 tablet (10 mg total) by mouth daily. Patient not taking: Reported on 08/10/2021 06/03/21   Medina-Vargas, Monina C, NP  fluticasone (FLONASE) 50 MCG/ACT nasal spray Place 1 spray into both nostrils daily. Patient not taking: Reported on 08/10/2021 06/03/21   Medina-Vargas, Monina C, NP  glucose blood (ACCU-CHEK AVIVA PLUS) test strip Check blood sugar 3 times a day 06/04/21   Medina-Vargas, Monina C, NP  glucose blood test strip Use to check 3 (three) times daily. 06/08/21     insulin aspart (NOVOLOG) 100 UNIT/ML FlexPen  Inject 11 Units into the skin 3 (three) times daily with meals. Patient not taking: Reported on 08/10/2021 07/20/21   [name], MD  insulin glargine-yfgn (SEMGLEE) 100 UNIT/ML Pen Inject 16 Units into the skin at bedtime. Patient not taking: Reported on 08/10/2021 07/17/21   [name], MD  Insulin Pen Needle 32G X 4 MM MISC Use to inject insulin 4 (four) times daily. 06/04/21   Medina-Vargas, Monina C, NP  isosorbide  mononitrate (IMDUR) 30 MG 24 hr tablet Take 0.5 tablets (15 mg total) by mouth daily. Patient not taking: Reported on 08/10/2021 06/03/21   Medina-Vargas, Monina C, NP  melatonin 3 MG TABS tablet Take 1 tablet (3 mg total) by mouth at bedtime. Patient not taking: Reported on 08/10/2021 05/20/21   Orvis Brill, MD  nicotine (NICODERM CQ - DOSED IN MG/24 HOURS) 14 mg/24hr patch Place 1 patch (14 mg total) onto the skin daily. Patient not taking: Reported on 08/10/2021 06/03/21   Medina-Vargas, Monina C, NP  pantoprazole (PROTONIX) 40 MG tablet Take 1 tablet by mouth once a day 08/05/21     pregabalin (LYRICA) 25 MG capsule Take 1 capsule (25 mg total) by mouth daily. Patient not taking: Reported on 08/10/2021 07/20/21   [name], MD  rosuvastatin (CRESTOR) 10 MG tablet Take 1 tablet (10 mg total) by mouth daily. Patient not taking: Reported on 08/10/2021 06/03/21   Medina-Vargas, Monina C, NP  senna-docusate (SENOKOT-S) 8.6-50 MG tablet Take 1 tablet by mouth at bedtime. Patient not taking: Reported on 08/10/2021 05/20/21   Orvis Brill, MD      Allergies    Benicar [olmesartan]    Review of Systems   Review of Systems  Constitutional:  Positive for fatigue.  Neurological:  Positive for weakness (Generalized).  All other systems reviewed and are negative.   Physical Exam Updated Vital Signs BP (!) 159/110 (BP Location: Right Arm)   Pulse (!) 134   Temp 98.9 F (37.2 C) (Rectal)   Resp 16   Ht 6' (1.829 m)   Wt 72.6 kg Comment: Estimated weight  SpO2 90%   BMI 21.70 kg/m  Physical Exam Vitals and nursing note reviewed.  Constitutional:      General: He is not in acute distress.    Appearance: He is well-developed and normal weight. He is ill-appearing. He is not toxic-appearing or diaphoretic.  HENT:     Head: Normocephalic and atraumatic.     Right Ear: External ear normal.     Left Ear: External ear normal.     Nose: Nose normal.     Mouth/Throat:     Mouth:  Mucous membranes are moist.     Pharynx: Oropharynx is clear.  Eyes:     Extraocular Movements: Extraocular movements intact.     Conjunctiva/sclera: Conjunctivae normal.  Cardiovascular:     Rate and Rhythm: Regular rhythm. Tachycardia present.     Heart sounds: No murmur heard. Pulmonary:     Effort: Pulmonary effort is normal. No respiratory distress.     Breath sounds: Normal breath sounds. No wheezing or rales.     Comments: Catheter in right upper chest Chest:     Chest wall: No tenderness.  Abdominal:     General: There is no distension.     Palpations: Abdomen is soft.     Tenderness: There is no abdominal tenderness.  Musculoskeletal:     Cervical back: Normal range of motion and neck supple.     Comments: Area  of chronic wound to left foot.  Purulent drainage is present.  Right BKA without any swelling, warmth, or erythema.  Skin:    General: Skin is warm and dry.  Neurological:     General: No focal deficit present.     Mental Status: He is alert and oriented to person, place, and time.     Cranial Nerves: No cranial nerve deficit.     Sensory: No sensory deficit.     Coordination: Coordination normal.  Psychiatric:        Mood and Affect: Mood normal.        Behavior: Behavior normal.      ED Results / Procedures / Treatments   Labs (all labs ordered are listed, but only abnormal results are displayed) Labs Reviewed  BASIC METABOLIC PANEL - Abnormal; Notable for the following components:      Result Value   Sodium 125 (*)    Chloride 86 (*)    Glucose, Bld 227 (*)    Creatinine, Ser 4.16 (*)    Calcium 8.4 (*)    GFR, Estimated 15 (*)    Anion gap 17 (*)    All other components within normal limits  CBC - Abnormal; Notable for the following components:   WBC 21.2 (*)    RBC 3.74 (*)    Hemoglobin 10.6 (*)    HCT 33.2 (*)    All other components within normal limits  SEDIMENTATION RATE - Abnormal; Notable for the following components:   Sed Rate  100 (*)    All other components within normal limits  C-REACTIVE PROTEIN - Abnormal; Notable for the following components:   CRP 24.9 (*)    All other components within normal limits  HEPATIC FUNCTION PANEL - Abnormal; Notable for the following components:   Albumin 2.9 (*)    AST 14 (*)    Total Bilirubin 1.5 (*)    Bilirubin, Direct 0.5 (*)    Indirect Bilirubin 1.0 (*)    All other components within normal limits  TROPONIN I (HIGH SENSITIVITY) - Abnormal; Notable for the following components:   Troponin I (High Sensitivity) 29 (*)    All other components within normal limits  CULTURE, BLOOD (ROUTINE X 2)  CULTURE, BLOOD (ROUTINE X 2)  LACTIC ACID, PLASMA  LACTIC ACID, PLASMA  URINALYSIS, ROUTINE W REFLEX MICROSCOPIC  PROTIME-INR  HEPATITIS B SURFACE ANTIGEN  HEPATITIS B SURFACE ANTIBODY,QUALITATIVE  HEPATITIS B SURFACE ANTIBODY, QUANTITATIVE  HEPATITIS B CORE ANTIBODY, TOTAL  HEPATITIS C ANTIBODY  CBC  RENAL FUNCTION PANEL  CBG MONITORING, ED  TROPONIN I (HIGH SENSITIVITY)    EKG EKG Interpretation  Date/Time:  Monday August 10 2021 02:45:32 EDT Ventricular Rate:  113 PR Interval:  138 QRS Duration: 92 QT Interval:  360 QTC Calculation: 493 R Axis:   34 Text Interpretation: Sinus tachycardia No significant change since last tracing Confirmed by Godfrey Pick ([id number]) on 08/10/2021 11:39:51 AM  Radiology DG Foot Complete Left  Result Date: 08/10/2021 CLINICAL DATA:  61 year old male with possible sepsis. Previous foot amputation. EXAM: LEFT FOOT - COMPLETE 3+ VIEW COMPARISON:  Left foot radiograph 06/28/2012. FINDINGS: Radiographic appearance of chronic left foot amputation at the proximal metatarsals, stable/satisfactory when compared to 2014. Regressed soft tissue swelling since that time. No soft tissue gas. Underlying degenerative changes. No suspicious osteolysis at this time. No acute fracture or dislocation. Some calcified peripheral vascular disease. IMPRESSION:  Satisfactory appearance of chronic left foot amputation at the level of the proximal  metatarsals. No plain radiographic evidence of osteomyelitis. Electronically Signed   By: Genevie Ann M.D.   On: 08/10/2021 10:46   DG Chest Port 1 View  Result Date: 08/10/2021 CLINICAL DATA:  61 year old male with possible sepsis. EXAM: PORTABLE CHEST 1 VIEW COMPARISON:  Chest radiographs 11/09/2019. FINDINGS: Portable AP upright view at 1030 hours. New right chest dual lumen dialysis type catheter. Mild chronic blunting of the right lung base and costophrenic angle are unchanged. Allowing for portable technique the lungs are clear. No pneumothorax. Mediastinal contours are stable and within normal limits. Visualized tracheal air column is within normal limits. No acute osseous abnormality identified. IMPRESSION: 1.  No acute cardiopulmonary abnormality. 2. New right chest dialysis type catheter since 2021. Electronically Signed   By: Genevie Ann M.D.   On: 08/10/2021 10:43    Procedures Procedures    Medications Ordered in ED Medications  lactated ringers bolus 500 mL (has no administration in time range)  0.9 %  sodium chloride infusion (has no administration in time range)  vancomycin (VANCOREADY) IVPB 1500 mg/300 mL (1,500 mg Intravenous New Bag/Given 08/10/21 1523)  vancomycin variable dose per unstable renal function (pharmacist dosing) (has no administration in time range)  ceFEPIme (MAXIPIME) 1 g in sodium chloride 0.9 % 100 mL IVPB (has no administration in time range)  Chlorhexidine Gluconate Cloth 2 % PADS 6 each (has no administration in time range)  clindamycin (CLEOCIN) capsule 600 mg (600 mg Oral Given 08/10/21 1528)  ceFEPIme (MAXIPIME) 1 g in sodium chloride 0.9 % 100 mL IVPB (0 g Intravenous Stopped 08/10/21 1514)    ED Course/ Medical Decision Making/ A&P                           Medical Decision Making Amount and/or Complexity of Data Reviewed Labs: ordered. Radiology: ordered. ECG/medicine  tests: ordered.  Risk Prescription drug management. Decision regarding hospitalization.   This patient presents to the ED for concern of fatigue and generalized weakness, this involves an extensive number of treatment options, and is a complaint that carries with it a high risk of complications and morbidity.  The differential diagnosis includes sepsis, dehydration, deconditioning, metabolic derangements, CHF exacerbation, ACS   Co morbidities that complicate the patient evaluation  PVD, HLD, HTN, GERD, neuropathy, right BKA, left transmetatarsal amputation, cognitive impairment with memory loss, ESRD on HD (M, W, F), T2DM, CAD, CHF   Additional history obtained:  Additional history obtained from N/A External records from outside source obtained and reviewed including EMR   Lab Tests:  I Ordered, and personally interpreted labs.  The pertinent results include: Initial lab work shows a leukocytosis of 21.2.  Patient is hyponatremic with elevated anion gap.  Further lab work pending at time of admission   Imaging Studies ordered:  I ordered imaging studies including right foot x-ray, chest x-ray I independently visualized and interpreted imaging which showed no acute findings I agree with the radiologist interpretation   Cardiac Monitoring: / EKG:  The patient was maintained on a cardiac monitor.  I personally viewed and interpreted the cardiac monitored which showed an underlying rhythm of: Sinus rhythm   Consultations Obtained:  I requested consultation with the orthopedic surgery,  and discussed lab and imaging findings as well as pertinent plan - they recommend: Vascular surgery consult and admission for debridement versus amputation   Problem List / ED Course / Critical interventions / Medication management  Patient is a 61 year old male  presenting for what he describes as fatigue and generalized weakness over the past several weeks.  He states that he feels that his  dialysis sessions are making him weak.  Currently, he goes to dialysis on M, W, F.  He did undergo a session on Friday.  Prior to being bedded in the ED, laboratory workup was initiated.  Initial labs show a leukocytosis, hyponatremia, and elevated anion gap.  On initial assessment, patient is found to be tachycardic and tachypneic.  He is ill-appearing.  He does have concern of a gangrenous wound to his left foot.  He has previously undergone a transmetatarsal amputation to this foot.  He states that this was several years ago.  He denies any current pain in this area.  Although he denies shortness of breath, patient is tachypneic with pursed lip breathing.  His lungs are clear to auscultation.  He is currently spitting his saliva into a container.  He denies nausea or difficulty swallowing but does state that he feels symptoms of reflux.  Patient presentation is concerning for possible sepsis with a severe soft tissue infection to the area of his left foot.  Further workup was initiated.  Broad-spectrum antibiotics were started.  Patient was given gentle hydration due to his history of ESRD.  Per chart review, patient underwent his left transmetatarsal amputation in 2014.  This was with Dr. Sharol Given.  I consulted orthopedic surgery due to concern of gangrenous wound.  Orthopedic surgery evaluated the patient in the ED and recommend admission for antibiotics and operative repair.  At this time it is unclear if they will attempt debridement versus amputation.  They did request a vascular surgery consult.  Orthopedic surgery ordered MRI.  I spoke with vascular surgeon, Dr. Donzetta Matters, who was currently involved in the procedure but did agree to evaluate the patient when he is able to.  Patient was admitted to hospitalist for further management. I ordered medication including broad-spectrum antibiotics for gangrenous wound; IV fluids for possible sepsis Reevaluation of the patient after these medicines showed that the patient  stayed the same I have reviewed the patients home medicines and have made adjustments as needed   Social Determinants of Health:  Multiple chronic comorbidities  CRITICAL CARE Performed by: Godfrey Pick   Total critical care time: 35 minutes  Critical care time was exclusive of separately billable procedures and treating other patients.  Critical care was necessary to treat or prevent imminent or life-threatening deterioration.  Critical care was time spent personally by me on the following activities: development of treatment plan with patient and/or surrogate as well as nursing, discussions with consultants, evaluation of patient's response to treatment, examination of patient, obtaining history from patient or surrogate, ordering and performing treatments and interventions, ordering and review of laboratory studies, ordering and review of radiographic studies, pulse oximetry and re-evaluation of patient's condition.       Final Clinical Impression(s) / ED Diagnoses Final diagnoses:  Gangrene of left foot [facility])    Rx / [place] Orders ED Discharge Orders     None         Godfrey Pick, MD 08/10/21 1718    Godfrey Pick, MD 08/10/21 1718

## 2021-08-10 NOTE — ED Notes (Signed)
Left foot partial amputation, guaze soiled with thick amber colored fluid, foul odor non healing amputation

## 2021-08-10 NOTE — ED Triage Notes (Addendum)
PT via [facility] from home for eval of weakness x "a couple months." Dialysis MWF, last treatment Friday. Pt denies CP, SHOB, fevers, additional sick symptoms, states he thinks dialysis is making him weak.   162/86 HR 114
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
## 2021-08-10 NOTE — ED Notes (Signed)
Pt placed in room 34. States he has has been having weakness after dialysis  and pain of 7/10 in his left foot. [name] is using pursed lip breathing but denies SOB and using oxygen HR 113, BP 117/78 (106 MAP) 99 on RA 20 R

## 2021-08-10 NOTE — ED Notes (Signed)
Pt return from MRI, unable to be done at this time due to pt unable to keep still.

## 2021-08-10 NOTE — ED Notes (Signed)
Unable to get a good IV line. Called for [place] iv to be placed.

## 2021-08-10 NOTE — Progress Notes (Signed)
Pharmacy Antibiotic Note  [name] is a 61 y.o. male admitted on 08/10/2021 with  wound infection .  Pharmacy has been consulted for cefepime and vancomycin dosing.  Presenting with weakness for a couple months - hx ESRD. Does have a L foot partial amputation with thick amber colored fluid and foul odor. WBC 21.2. Afebrile.   Plan: Vancomycin 1500 mg IV once then will dose per HD dosing based on schedule Cefepime 1g IV every 24 hours Monitor renal fx, cx results, clinical pic, and vanc levels as appropriate  Height: 6' (182.9 cm) Weight: 72.6 kg (160 lb) (Estimated weight) IBW/kg (Calculated) : 77.6  Temp (24hrs), Avg:99 F (37.2 C), Min:98.4 F (36.9 C), Max:99.5 F (37.5 C)  Recent Labs  Lab 08/10/21 0302  WBC 21.2*  CREATININE 4.16*    Estimated Creatinine Clearance: 19.1 mL/min (A) (by C-G formula based on SCr of 4.16 mg/dL (H)).    Allergies  Allergen Reactions   Benicar [Olmesartan] Cough    Antimicrobials this admission: Vancomycin 8/7 >>  Cefepime 8/7 >>   Dose adjustments this admission: N/A  Microbiology results: 8/7 BCx: sent  Thank you for allowing pharmacy to be a part of this patient's care.  Antonietta Jewel, PharmD, [place] Clinical Pharmacist  Phone: [phone] 08/10/2021 11:56 AM  Please check AMION for all [place] phone numbers After 10:00 PM, call [place] [phone]

## 2021-08-11 ENCOUNTER — Encounter (HOSPITAL_COMMUNITY): Payer: Self-pay

## 2021-08-11 ENCOUNTER — Inpatient Hospital Stay (HOSPITAL_COMMUNITY): Payer: Medicare Other

## 2021-08-11 DIAGNOSIS — T8743 Infection of amputation stump, right lower extremity: Secondary | ICD-10-CM | POA: Diagnosis not present

## 2021-08-11 DIAGNOSIS — Z794 Long term (current) use of insulin: Secondary | ICD-10-CM

## 2021-08-11 DIAGNOSIS — E1152 Type 2 diabetes mellitus with diabetic peripheral angiopathy with gangrene: Principal | ICD-10-CM

## 2021-08-11 DIAGNOSIS — T8744 Infection of amputation stump, left lower extremity: Secondary | ICD-10-CM | POA: Diagnosis not present

## 2021-08-11 DIAGNOSIS — I70262 Atherosclerosis of native arteries of extremities with gangrene, left leg: Secondary | ICD-10-CM | POA: Diagnosis not present

## 2021-08-11 DIAGNOSIS — I739 Peripheral vascular disease, unspecified: Secondary | ICD-10-CM

## 2021-08-11 DIAGNOSIS — F17210 Nicotine dependence, cigarettes, uncomplicated: Secondary | ICD-10-CM

## 2021-08-11 DIAGNOSIS — I96 Gangrene, not elsewhere classified: Secondary | ICD-10-CM | POA: Diagnosis not present

## 2021-08-11 DIAGNOSIS — N186 End stage renal disease: Secondary | ICD-10-CM | POA: Diagnosis not present

## 2021-08-11 DIAGNOSIS — E1151 Type 2 diabetes mellitus with diabetic peripheral angiopathy without gangrene: Secondary | ICD-10-CM

## 2021-08-11 DIAGNOSIS — Z992 Dependence on renal dialysis: Secondary | ICD-10-CM

## 2021-08-11 LAB — HEPATITIS C ANTIBODY: HCV Ab: NONREACTIVE

## 2021-08-11 LAB — RENAL FUNCTION PANEL
Albumin: 2.3 g/dL — ABNORMAL LOW (ref 3.5–5.0)
Anion gap: 16 — ABNORMAL HIGH (ref 5–15)
BUN: 30 mg/dL — ABNORMAL HIGH (ref 8–23)
CO2: 21 mmol/L — ABNORMAL LOW (ref 22–32)
Calcium: 8 mg/dL — ABNORMAL LOW (ref 8.9–10.3)
Chloride: 93 mmol/L — ABNORMAL LOW (ref 98–111)
Creatinine, Ser: 4.64 mg/dL — ABNORMAL HIGH (ref 0.61–1.24)
GFR, Estimated: 14 mL/min — ABNORMAL LOW (ref >60)
Glucose, Bld: 186 mg/dL — ABNORMAL HIGH (ref 70–99)
Phosphorus: 3.7 mg/dL (ref 2.5–4.6)
Potassium: 3.2 mmol/L — ABNORMAL LOW (ref 3.5–5.1)
Sodium: 130 mmol/L — ABNORMAL LOW (ref 135–145)

## 2021-08-11 LAB — CBC
HCT: 22.7 % — ABNORMAL LOW (ref 39.0–52.0)
Hemoglobin: 7.7 g/dL — ABNORMAL LOW (ref 13.0–17.0)
MCH: 29.1 pg (ref 26.0–34.0)
MCHC: 33.9 g/dL (ref 30.0–36.0)
MCV: 85.7 fL (ref 80.0–100.0)
Platelets: 293 10*3/uL (ref 150–400)
RBC: 2.65 MIL/uL — ABNORMAL LOW (ref 4.22–5.81)
RDW: 13.6 % (ref 11.5–15.5)
WBC: 23.2 10*3/uL — ABNORMAL HIGH (ref 4.0–10.5)
nRBC: 0 % (ref 0.0–0.2)

## 2021-08-11 LAB — HEMOGLOBIN A1C
Hgb A1c MFr Bld: 8.7 % — ABNORMAL HIGH (ref 4.8–5.6)
Mean Plasma Glucose: 202.99 mg/dL

## 2021-08-11 LAB — GLUCOSE, CAPILLARY
Glucose-Capillary: 185 mg/dL — ABNORMAL HIGH (ref 70–99)
Glucose-Capillary: 159 mg/dL — ABNORMAL HIGH (ref 70–99)
Glucose-Capillary: 126 mg/dL — ABNORMAL HIGH (ref 70–99)

## 2021-08-11 LAB — HEPATITIS B SURFACE ANTIGEN: Hepatitis B Surface Ag: NONREACTIVE

## 2021-08-11 LAB — PROTIME-INR
INR: 1.4 — ABNORMAL HIGH (ref 0.8–1.2)
Prothrombin Time: 17.3 seconds — ABNORMAL HIGH (ref 11.4–15.2)

## 2021-08-11 LAB — HEPATITIS B SURFACE ANTIBODY,QUALITATIVE: Hep B S Ab: REACTIVE — AB

## 2021-08-11 LAB — SURGICAL PCR SCREEN
MRSA, PCR: NEGATIVE
Staphylococcus aureus: POSITIVE — AB

## 2021-08-11 LAB — HEPATITIS B CORE ANTIBODY, TOTAL: Hep B Core Total Ab: REACTIVE — AB

## 2021-08-11 MED ORDER — PENTAFLUOROPROP-TETRAFLUOROETH EX AERO: 1.0000 | INHALATION_SPRAY | CUTANEOUS | Status: DC | PRN

## 2021-08-11 MED ORDER — MELATONIN 5 MG PO TABS
5.0000 mg | ORAL_TABLET | Freq: Every day | ORAL | Status: DC
Start: 2021-08-11 — End: 2021-08-21
  Administered 2021-08-11 – 2021-08-19 (×9): 5 mg via ORAL
  Filled 2021-08-11 (×9): qty 1

## 2021-08-11 MED ORDER — MUPIROCIN 2 % EX OINT
1.0000 | TOPICAL_OINTMENT | Freq: Two times a day (BID) | CUTANEOUS | Status: AC
  Administered 2021-08-12 – 2021-08-16 (×10): 1 via NASAL
  Filled 2021-08-11 (×2): qty 22

## 2021-08-11 MED ORDER — LIDOCAINE-PRILOCAINE 2.5-2.5 % EX CREA: 1.0000 | TOPICAL_CREAM | CUTANEOUS | Status: DC | PRN

## 2021-08-11 MED ORDER — VANCOMYCIN HCL 750 MG/150ML IV SOLN: 750.0000 mg | INTRAVENOUS | Status: DC

## 2021-08-11 MED ORDER — ALTEPLASE 2 MG IJ SOLR: 2.0000 mg | Freq: Once | INTRAMUSCULAR | Status: DC | PRN

## 2021-08-11 MED ORDER — ANTICOAGULANT SODIUM CITRATE 4% (200MG/5ML) IV SOLN: 5.0000 mL | Status: DC | PRN

## 2021-08-11 MED ORDER — VANCOMYCIN HCL 750 MG/150ML IV SOLN
750.0000 mg | INTRAVENOUS | Status: AC
  Administered 2021-08-11: 750 mg via INTRAVENOUS
  Filled 2021-08-11: qty 150

## 2021-08-11 MED ORDER — CHLORHEXIDINE GLUCONATE CLOTH 2 % EX PADS
6.0000 | MEDICATED_PAD | Freq: Every day | CUTANEOUS | Status: DC
  Administered 2021-08-11 – 2021-08-20 (×10): 6 via TOPICAL

## 2021-08-11 MED ORDER — LIDOCAINE HCL (PF) 1 % IJ SOLN: 5.0000 mL | INTRAMUSCULAR | Status: DC | PRN

## 2021-08-11 MED ORDER — HEPARIN SODIUM (PORCINE) 1000 UNIT/ML DIALYSIS
1000.0000 [IU] | INTRAMUSCULAR | Status: DC | PRN
  Administered 2021-08-11: 3000 [IU]
  Filled 2021-08-11 (×2): qty 1

## 2021-08-11 MED ORDER — DARBEPOETIN ALFA 40 MCG/0.4ML IJ SOSY
40.0000 ug | PREFILLED_SYRINGE | INTRAMUSCULAR | Status: DC
  Administered 2021-08-13 – 2021-08-19 (×2): 40 ug via INTRAVENOUS
  Filled 2021-08-11 (×4): qty 0.4

## 2021-08-11 NOTE — Plan of Care (Signed)
Complained of being cold entire night. Provided warm blankets, hot packs, socks, and increased temperature in room. New onset atrial fibrillation. MD notified. MD at bedside to assess. No orders given. No other acute events overnight.    Problem: Education: Goal: Knowledge of General Education information will improve Description: Including pain rating scale, medication(s)/side effects and non-pharmacologic comfort measures Outcome: Progressing   Problem: Health Behavior/Discharge Planning: Goal: Ability to manage health-related needs will improve Outcome: Progressing   Problem: Clinical Measurements: Goal: Ability to maintain clinical measurements within normal limits will improve Outcome: Progressing Goal: Will remain free from infection Outcome: Progressing Goal: Diagnostic test results will improve Outcome: Progressing Goal: Respiratory complications will improve Outcome: Progressing Goal: Cardiovascular complication will be avoided Outcome: Progressing   Problem: Activity: Goal: Risk for activity intolerance will decrease Outcome: Progressing   Problem: Nutrition: Goal: Adequate nutrition will be maintained Outcome: Progressing   Problem: Coping: Goal: Level of anxiety will decrease Outcome: Progressing   Problem: Elimination: Goal: Will not experience complications related to bowel motility Outcome: Progressing Goal: Will not experience complications related to urinary retention Outcome: Progressing   Problem: Pain Managment: Goal: General experience of comfort will improve Outcome: Progressing   Problem: Safety: Goal: Ability to remain free from injury will improve Outcome: Progressing   Problem: Skin Integrity: Goal: Risk for impaired skin integrity will decrease Outcome: Progressing   Problem: Education: Goal: Ability to describe self-care measures that may prevent or decrease complications (Diabetes Survival Skills Education) will improve Outcome:  Progressing Goal: Individualized Educational Video(s) Outcome: Progressing   Problem: Coping: Goal: Ability to adjust to condition or change in health will improve Outcome: Progressing   Problem: Fluid Volume: Goal: Ability to maintain a balanced intake and output will improve Outcome: Progressing   Problem: Health Behavior/Discharge Planning: Goal: Ability to identify and utilize available resources and services will improve Outcome: Progressing Goal: Ability to manage health-related needs will improve Outcome: Progressing   Problem: Metabolic: Goal: Ability to maintain appropriate glucose levels will improve Outcome: Progressing   Problem: Nutritional: Goal: Maintenance of adequate nutrition will improve Outcome: Progressing Goal: Progress toward achieving an optimal weight will improve Outcome: Progressing   Problem: Skin Integrity: Goal: Risk for impaired skin integrity will decrease Outcome: Progressing   Problem: Tissue Perfusion: Goal: Adequacy of tissue perfusion will improve Outcome: Progressing
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
## 2021-08-11 NOTE — Progress Notes (Addendum)
[facility] Progress Note   Subjective: Seen in room. No new complaints. For BKA tomorrow. HD today.   Objective Vitals:   08/10/21 2055 08/11/21 0348 08/11/21 0505 08/11/21 0805  BP: 137/70 122/60 119/66 126/69  Pulse:  (!) 115 (!) 108 98  Resp: (!) 42 (!) 24 18   Temp:  (!) 100.6 F (38.1 C)  100 F (37.8 C)  TempSrc:  Oral  Oral  SpO2:  100% 100% 94%  Weight:      Height:          Additional Objective Labs: Basic Metabolic Panel: Recent Labs  Lab 08/10/21 0302 08/11/21 0757  NA 125* 130*  K 3.5 3.2*  CL 86* 93*  CO2 22 21*  GLUCOSE 227* 186*  BUN 20 30*  CREATININE 4.16* 4.64*  CALCIUM 8.4* 8.0*  PHOS  --  3.7   CBC: Recent Labs  Lab 08/10/21 0302 08/11/21 0757  WBC 21.2* 23.2*  HGB 10.6* 7.7*  HCT 33.2* 22.7*  MCV 88.8 85.7  PLT 224 293   Blood Culture    Component Value Date/Time   SDES BLOOD SITE NOT SPECIFIED 08/10/2021 1416   SDES BLOOD SITE NOT SPECIFIED 08/10/2021 1416   SPECREQUEST  08/10/2021 1416    BOTTLES DRAWN AEROBIC ONLY Blood Culture adequate volume   SPECREQUEST  08/10/2021 1416    BOTTLES DRAWN AEROBIC ONLY Blood Culture adequate volume   CULT  08/10/2021 1416    NO GROWTH < 24 HOURS Performed at [facility], [place] [name], [place], [place] [address]    CULT  08/10/2021 1416    NO GROWTH < 24 HOURS Performed at [facility], [place] [address] [place], [place] [address]    REPTSTATUS PENDING 08/10/2021 1416   REPTSTATUS PENDING 08/10/2021 1416     Physical Exam General: Alert, nad Heart: RRR Lungs: Clear, bilaterally  Abdomen: soft non-tender Extremities: L TMA with ischemic changes; R BKA. No sig edema  Dialysis Access: TDC; L AVF (placed 07/02/21)  Medications:  sodium chloride 125 mL/hr at 08/11/21 0631   ceFEPime (MAXIPIME) IV      heparin injection (subcutaneous)  5,000 Units Subcutaneous Q8H   insulin aspart  0-5 Units Subcutaneous QHS   insulin aspart  0-6 Units Subcutaneous TID  WC   vancomycin variable dose per unstable renal function (pharmacist dosing)   Does not apply See admin instructions    Dialysis Orders:  Unit: [facility] MWF Time: 4:00  EDW: 72 kg  Flows: 400/700  [place]: 3K/2.5 Ca  Access: TDC ; L AVF (placed 07/02/21)  Heparin: No bolus heparin ESA: None  VDRA: Hectorol 3 TIW    Assessment/Plan: Left limb gangrene/ischemia.  Vascular/Ortho consulted. Plan for left BKA per Dr. Sharol Given. Antibiotics started per primary team. Blood cultures pending.  ESRD -  HD MWF. Not adhearent to Rx as outpatient. Last HD 8/4. HD off schedule today. Back on schedule tomorrow.  Hypertension/volume  - BP better. Continue home meds (amlodipine, carvedilol Imdur). Is at dry weight. No volume excess. Minimal UF with HD.  Anemia  - Hgb 10.6 >7.7. Will order ESA with next HD. Check Fe studies. Follow trends.  Metabolic bone disease -  Continue home meds. Cont Vit D. Check phos in the am DMT2 -insulin per primary GERD - on protonix  Nutrition - Renal diet, add prot supp for low albumin when eating.     Lynnda Child PA-C [facility] 08/11/2021,12:00 PM
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
## 2021-08-11 NOTE — Progress Notes (Addendum)
Received patient in bed to unit.  Alert and oriented.  Informed consent obtained.   Treatment initiated: 9741 Treatment completed: 1838  Patient tolerated well.  Transported back to the room  alert, without acute distress.  Hand-off given to patient's nurse.   Access used: HD catheter right chest Access issues: none  Total UF removed: 0 Medication(s) given: vancomycin Post HD VS: BP 127/62 MAP 82 HR 97 RR 26 Sat 100% on room air Temp oral 98.4 Post HD weight: 70.8 kg   [facility]

## 2021-08-11 NOTE — Progress Notes (Signed)
Pt receives out-pt HD at [facility] on MWF. Pt arrives at 11:20 for 11:40 chair time. Will assist as needed.   Melven Sartorius Renal Navigator [phone]

## 2021-08-11 NOTE — Plan of Care (Signed)
  Problem: Pain Managment: Goal: General experience of comfort will improve Outcome: Progressing   Problem: Coping: Goal: Level of anxiety will decrease Outcome: Progressing   Problem: Nutrition: Goal: Adequate nutrition will be maintained Outcome: Progressing
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
## 2021-08-11 NOTE — Hospital Course (Addendum)
Day 1 8/8: Patient denies being in pain today. Patient states that the cough he had yesterday was do to acid reflux. Patient is reluctant to talk and does not have any questions at the moment. Discussed that Dr. Sharol Given is planning an AKA of his LLE tomorrow (8/9). Discussed plan to continue antibiotics and normal diet. Discussed NPO at midnight.   PE: clean dialysis port, lungs normal, LLE wound  Pt is HepB+, plan is to arrange OP txt with Dr. Johnnye Sima at [facility].   #Gangrene of the left foot #PAD  Based on Vascular's recommendation salvaging the foot will not be possible, above-knee amputation is recommended. ABI is 0.49. Plan was discussed with patient and he is scheduled for his surgery tomorrow with Dr. Sharol Given. Blood cultures results show no growth at <24 hours. Patient continues to have low grade fever (100 F) and elevated WBC of 23.2.  -- NPO after midnight tonight -- AKA scheduled for tomorrow  -- Continue vancomycin and cefepime  -- PRN Dilaudid 0.5 mg for pain     #ESRD on hemodialsysis Patient reports last HD treatment was last Friday. He has a recent left ulnar to basilic vein fistula on the left arm (07/02/2021).  GFR at time of admission is 15, Cr 4.16. EDW is 72kg.  - Scheduled Dialysis during his hospitalization started today. Per nephrology, he will switch back to his Mon-Wed-Fri schedule after this.    #DM type 2 Today glucose has decreased to 185. A1C as of today is 8.7%. . Most likely a combination of ESRD exacerbation, infection, and poor medication adherence.  - Continue sliding scale tonight and monitor sugars.   #Diabetic Neuropathy History of poorly controlled diabetes. On PE, patient denies any pain at wound site. Denies pain during palpation of the LE.  Evidence of small and large fiber neuropathy.  -- Withhold gabapentin for now -- PRN Dilaudid 0.'5mg'$  for pain   ---------------------------------------------- Day 2 8/9: Patient states that he experiences pain in  his hands when it is cold outside. Patient is requesting his gloves. Patient continues to complain of 9/10 pain in his hands that began this hospitalization. Discussed that his poor circulation is likely the cause of the pain he is experiencing in his hands. Discussed plan to speak with vascular surgery today to reassess his fingers. Discussed plan for his amputation today.    PE: good radial pulses bilaterally, dark discoloration of the tips of the fingers bilaterally, normal heart/lungs   ---------------------------------------------- Day 3 8/10: Patient states that he is unhappy with the nurse overnight as she was ugly. Patient states that he was experiencing nausea and vomiting overnight. He states that his heartburn is unchanged. Patient states that he does not have an appetite this morning. Patient states that his fingers are still hurting today with 5/10 in severity. Patient is upset with how his left leg looks after his amputation.    PE:   ---------------------------------------------- 8/11: Patient states that he cannot pee because the nurse is so unattractive to him. Patient is still complaining of pain in his fingertips that is 4/10 in severity. Patient denies chest pain, SOB, abdominal pain, nausea, vomiting. Discussed plan to await vascular surgery recommendations today.    PE: no abdominal tenderness, normal bowel sounds, normal heart/lungs,      8/12: Pts says fingers are getting worse and claims numbness, however no worsening on PE. Pt states appetite is good. Discussed plan with vascular. Discussed labs looking good.   PE: Normal cardio exam,  D/C cardiac monitoring

## 2021-08-12 ENCOUNTER — Inpatient Hospital Stay (HOSPITAL_COMMUNITY): Payer: Medicare Other | Admitting: Anesthesiology

## 2021-08-12 ENCOUNTER — Encounter (HOSPITAL_COMMUNITY)
Admission: EM | Disposition: A | Discharge status: Skilled Nursing Facility | Payer: Self-pay | Source: Home / Self Care | Attending: Internal Medicine

## 2021-08-12 ENCOUNTER — Encounter (HOSPITAL_COMMUNITY): Payer: Self-pay | Admitting: Internal Medicine

## 2021-08-12 ENCOUNTER — Other Ambulatory Visit: Payer: Self-pay

## 2021-08-12 ENCOUNTER — Inpatient Hospital Stay (HOSPITAL_COMMUNITY): Payer: Medicare Other

## 2021-08-12 DIAGNOSIS — T8743 Infection of amputation stump, right lower extremity: Secondary | ICD-10-CM | POA: Diagnosis not present

## 2021-08-12 DIAGNOSIS — E1122 Type 2 diabetes mellitus with diabetic chronic kidney disease: Secondary | ICD-10-CM | POA: Diagnosis not present

## 2021-08-12 DIAGNOSIS — E1152 Type 2 diabetes mellitus with diabetic peripheral angiopathy with gangrene: Secondary | ICD-10-CM

## 2021-08-12 DIAGNOSIS — I252 Old myocardial infarction: Secondary | ICD-10-CM

## 2021-08-12 DIAGNOSIS — I96 Gangrene, not elsewhere classified: Secondary | ICD-10-CM | POA: Diagnosis not present

## 2021-08-12 DIAGNOSIS — I120 Hypertensive chronic kidney disease with stage 5 chronic kidney disease or end stage renal disease: Secondary | ICD-10-CM

## 2021-08-12 DIAGNOSIS — I340 Nonrheumatic mitral (valve) insufficiency: Secondary | ICD-10-CM | POA: Diagnosis not present

## 2021-08-12 DIAGNOSIS — I361 Nonrheumatic tricuspid (valve) insufficiency: Secondary | ICD-10-CM | POA: Diagnosis not present

## 2021-08-12 DIAGNOSIS — I70262 Atherosclerosis of native arteries of extremities with gangrene, left leg: Secondary | ICD-10-CM | POA: Diagnosis not present

## 2021-08-12 DIAGNOSIS — N186 End stage renal disease: Secondary | ICD-10-CM | POA: Diagnosis not present

## 2021-08-12 DIAGNOSIS — T8744 Infection of amputation stump, left lower extremity: Secondary | ICD-10-CM | POA: Diagnosis not present

## 2021-08-12 DIAGNOSIS — E1165 Type 2 diabetes mellitus with hyperglycemia: Secondary | ICD-10-CM

## 2021-08-12 HISTORY — PX: AMPUTATION: SHX166

## 2021-08-12 LAB — POCT I-STAT, CHEM 8
BUN: 19 mg/dL (ref 8–23)
Calcium, Ion: 1.04 mmol/L — ABNORMAL LOW (ref 1.15–1.40)
Chloride: 94 mmol/L — ABNORMAL LOW (ref 98–111)
Creatinine, Ser: 3.1 mg/dL — ABNORMAL HIGH (ref 0.61–1.24)
Glucose, Bld: 127 mg/dL — ABNORMAL HIGH (ref 70–99)
HCT: 23 % — ABNORMAL LOW (ref 39.0–52.0)
Hemoglobin: 7.8 g/dL — ABNORMAL LOW (ref 13.0–17.0)
Potassium: 3.2 mmol/L — ABNORMAL LOW (ref 3.5–5.1)
Sodium: 133 mmol/L — ABNORMAL LOW (ref 135–145)
TCO2: 25 mmol/L (ref 22–32)

## 2021-08-12 LAB — GLUCOSE, CAPILLARY
Glucose-Capillary: 155 mg/dL — ABNORMAL HIGH (ref 70–99)
Glucose-Capillary: 125 mg/dL — ABNORMAL HIGH (ref 70–99)
Glucose-Capillary: 127 mg/dL — ABNORMAL HIGH (ref 70–99)
Glucose-Capillary: 130 mg/dL — ABNORMAL HIGH (ref 70–99)
Glucose-Capillary: 134 mg/dL — ABNORMAL HIGH (ref 70–99)
Glucose-Capillary: 134 mg/dL — ABNORMAL HIGH (ref 70–99)
Glucose-Capillary: 153 mg/dL — ABNORMAL HIGH (ref 70–99)

## 2021-08-12 LAB — ECHOCARDIOGRAM COMPLETE
Area-P 1/2: 5.97 cm2
Calc EF: 34.1 %
Height: 72 in
MV M vel: 4.13 m/s
MV Peak grad: 68.2 mmHg
S' Lateral: 4.1 cm
Single Plane A2C EF: 32.7 %
Single Plane A4C EF: 34.7 %
Weight: 2497.37 oz

## 2021-08-12 LAB — CBC WITH DIFFERENTIAL/PLATELET
Abs Immature Granulocytes: 0.07 10*3/uL (ref 0.00–0.07)
Basophils Absolute: 0 10*3/uL (ref 0.0–0.1)
Basophils Relative: 0 %
Eosinophils Absolute: 0 10*3/uL (ref 0.0–0.5)
Eosinophils Relative: 0 %
HCT: 24 % — ABNORMAL LOW (ref 39.0–52.0)
Hemoglobin: 7.9 g/dL — ABNORMAL LOW (ref 13.0–17.0)
Immature Granulocytes: 1 %
Lymphocytes Relative: 7 %
Lymphs Abs: 0.9 10*3/uL (ref 0.7–4.0)
MCH: 28.4 pg (ref 26.0–34.0)
MCHC: 32.9 g/dL (ref 30.0–36.0)
MCV: 86.3 fL (ref 80.0–100.0)
Monocytes Absolute: 0.7 10*3/uL (ref 0.1–1.0)
Monocytes Relative: 6 %
Neutro Abs: 10.5 10*3/uL — ABNORMAL HIGH (ref 1.7–7.7)
Neutrophils Relative %: 86 %
Platelets: 217 10*3/uL (ref 150–400)
RBC: 2.78 MIL/uL — ABNORMAL LOW (ref 4.22–5.81)
RDW: 13.5 % (ref 11.5–15.5)
WBC: 12.2 10*3/uL — ABNORMAL HIGH (ref 4.0–10.5)
nRBC: 0 % (ref 0.0–0.2)

## 2021-08-12 LAB — IRON AND TIBC
Iron: 38 ug/dL — ABNORMAL LOW (ref 45–182)
Saturation Ratios: 26 % (ref 17.9–39.5)
TIBC: 144 ug/dL — ABNORMAL LOW (ref 250–450)
UIBC: 106 ug/dL

## 2021-08-12 LAB — FERRITIN: Ferritin: 414 ng/mL — ABNORMAL HIGH (ref 24–336)

## 2021-08-12 LAB — HEPATITIS B SURFACE ANTIBODY, QUANTITATIVE: Hep B S AB Quant (Post): 933.6 m[IU]/mL (ref >9.9)

## 2021-08-12 LAB — PREPARE RBC (CROSSMATCH)

## 2021-08-12 SURGERY — AMPUTATION, ABOVE KNEE
Anesthesia: General | Site: Knee | Laterality: Left

## 2021-08-12 MED ORDER — HYDROMORPHONE HCL 1 MG/ML IJ SOLN
0.2500 mg | INTRAMUSCULAR | Status: DC | PRN
  Administered 2021-08-12: 0.5 mg via INTRAVENOUS

## 2021-08-12 MED ORDER — MIDAZOLAM HCL 2 MG/2ML IJ SOLN
INTRAMUSCULAR | Status: AC
  Filled 2021-08-12: qty 2

## 2021-08-12 MED ORDER — TRANEXAMIC ACID-NACL 1000-0.7 MG/100ML-% IV SOLN
INTRAVENOUS | Status: AC
  Filled 2021-08-12: qty 100

## 2021-08-12 MED ORDER — JUVEN PO PACK
1.0000 | PACK | Freq: Two times a day (BID) | ORAL | Status: DC
  Administered 2021-08-13 – 2021-08-20 (×13): 1 via ORAL
  Filled 2021-08-12 (×14): qty 1

## 2021-08-12 MED ORDER — MIDAZOLAM HCL 2 MG/2ML IJ SOLN
INTRAMUSCULAR | Status: DC | PRN
  Administered 2021-08-12: 1 mg via INTRAVENOUS

## 2021-08-12 MED ORDER — MAGNESIUM CITRATE PO SOLN: 1.0000 | Freq: Once | ORAL | Status: DC | PRN

## 2021-08-12 MED ORDER — ZINC SULFATE 220 (50 ZN) MG PO CAPS
220.0000 mg | ORAL_CAPSULE | Freq: Every day | ORAL | Status: DC
  Administered 2021-08-12 – 2021-08-20 (×9): 220 mg via ORAL
  Filled 2021-08-12 (×10): qty 1

## 2021-08-12 MED ORDER — LIDOCAINE HCL (PF) 1 % IJ SOLN: 5.0000 mL | INTRAMUSCULAR | Status: DC | PRN

## 2021-08-12 MED ORDER — PANTOPRAZOLE SODIUM 40 MG PO TBEC
40.0000 mg | DELAYED_RELEASE_TABLET | Freq: Every day | ORAL | Status: DC
  Administered 2021-08-12 – 2021-08-20 (×9): 40 mg via ORAL
  Filled 2021-08-12 (×10): qty 1

## 2021-08-12 MED ORDER — HYDRALAZINE HCL 20 MG/ML IJ SOLN: 5.0000 mg | INTRAMUSCULAR | Status: DC | PRN

## 2021-08-12 MED ORDER — BISACODYL 5 MG PO TBEC: 5.0000 mg | DELAYED_RELEASE_TABLET | Freq: Every day | ORAL | Status: DC | PRN

## 2021-08-12 MED ORDER — PHENYLEPHRINE 80 MCG/ML (10ML) SYRINGE FOR IV PUSH (FOR BLOOD PRESSURE SUPPORT)
PREFILLED_SYRINGE | INTRAVENOUS | Status: DC | PRN
  Administered 2021-08-12 (×2): 80 ug via INTRAVENOUS

## 2021-08-12 MED ORDER — ANTICOAGULANT SODIUM CITRATE 4% (200MG/5ML) IV SOLN: 5.0000 mL | Status: DC | PRN

## 2021-08-12 MED ORDER — CHLORHEXIDINE GLUCONATE 4 % EX LIQD
60.0000 mL | Freq: Once | CUTANEOUS | Status: DC
  Filled 2021-08-12: qty 15

## 2021-08-12 MED ORDER — LIDOCAINE-PRILOCAINE 2.5-2.5 % EX CREA: 1.0000 | TOPICAL_CREAM | CUTANEOUS | Status: DC | PRN

## 2021-08-12 MED ORDER — ALBUMIN HUMAN 25 % IV SOLN
25.0000 g | Freq: Once | INTRAVENOUS | Status: AC
  Administered 2021-08-12: 25 g via INTRAVENOUS
  Filled 2021-08-12: qty 100

## 2021-08-12 MED ORDER — ALUM & MAG HYDROXIDE-SIMETH 200-200-20 MG/5ML PO SUSP: 15.0000 mL | ORAL | Status: DC | PRN

## 2021-08-12 MED ORDER — HYDROMORPHONE HCL 1 MG/ML IJ SOLN
INTRAMUSCULAR | Status: AC
  Filled 2021-08-12: qty 1

## 2021-08-12 MED ORDER — CEFAZOLIN SODIUM-DEXTROSE 2-4 GM/100ML-% IV SOLN
2.0000 g | INTRAVENOUS | Status: AC
  Administered 2021-08-12: 2 g via INTRAVENOUS
  Filled 2021-08-12: qty 100

## 2021-08-12 MED ORDER — SODIUM CHLORIDE 0.9 % IV SOLN: 10.0000 mL/h | Freq: Once | INTRAVENOUS | Status: AC

## 2021-08-12 MED ORDER — GABAPENTIN 100 MG PO CAPS
100.0000 mg | ORAL_CAPSULE | Freq: Once | ORAL | Status: AC
Start: 2021-08-12 — End: 2021-08-12
  Administered 2021-08-12: 100 mg via ORAL
  Filled 2021-08-12: qty 1

## 2021-08-12 MED ORDER — PHENOL 1.4 % MT LIQD: 1.0000 | OROMUCOSAL | Status: DC | PRN

## 2021-08-12 MED ORDER — LABETALOL HCL 5 MG/ML IV SOLN: 10.0000 mg | INTRAVENOUS | Status: DC | PRN

## 2021-08-12 MED ORDER — ASCORBIC ACID 500 MG PO TABS
1000.0000 mg | ORAL_TABLET | Freq: Every day | ORAL | Status: DC
  Administered 2021-08-12 – 2021-08-20 (×9): 1000 mg via ORAL
  Filled 2021-08-12 (×10): qty 2

## 2021-08-12 MED ORDER — MAGNESIUM SULFATE 2 GM/50ML IV SOLN: 2.0000 g | Freq: Every day | INTRAVENOUS | Status: DC | PRN

## 2021-08-12 MED ORDER — LIDOCAINE 2% (20 MG/ML) 5 ML SYRINGE
INTRAMUSCULAR | Status: DC | PRN
  Administered 2021-08-12: 80 mg via INTRAVENOUS

## 2021-08-12 MED ORDER — ALTEPLASE 2 MG IJ SOLR: 2.0000 mg | Freq: Once | INTRAMUSCULAR | Status: DC | PRN

## 2021-08-12 MED ORDER — SODIUM CHLORIDE 0.9 % IV SOLN: INTRAVENOUS | Status: DC

## 2021-08-12 MED ORDER — OXYCODONE HCL 5 MG PO TABS
5.0000 mg | ORAL_TABLET | ORAL | Status: DC | PRN
  Administered 2021-08-14: 5 mg via ORAL
  Administered 2021-08-15 – 2021-08-16 (×3): 10 mg via ORAL
  Filled 2021-08-12 (×7): qty 2

## 2021-08-12 MED ORDER — ONDANSETRON HCL 4 MG/2ML IJ SOLN: 4.0000 mg | Freq: Four times a day (QID) | INTRAMUSCULAR | Status: DC | PRN

## 2021-08-12 MED ORDER — FENTANYL CITRATE (PF) 250 MCG/5ML IJ SOLN
INTRAMUSCULAR | Status: DC | PRN
  Administered 2021-08-12 (×4): 25 ug via INTRAVENOUS

## 2021-08-12 MED ORDER — CHLORHEXIDINE GLUCONATE 0.12 % MT SOLN
15.0000 mL | Freq: Once | OROMUCOSAL | Status: AC
  Administered 2021-08-12: 15 mL via OROMUCOSAL

## 2021-08-12 MED ORDER — INSULIN ASPART 100 UNIT/ML IJ SOLN: 0.0000 [IU] | INTRAMUSCULAR | Status: DC | PRN

## 2021-08-12 MED ORDER — CEFAZOLIN SODIUM-DEXTROSE 2-4 GM/100ML-% IV SOLN: 2.0000 g | Freq: Three times a day (TID) | INTRAVENOUS | Status: DC

## 2021-08-12 MED ORDER — POVIDONE-IODINE 10 % EX SWAB: 2.0000 | Freq: Once | CUTANEOUS | Status: DC

## 2021-08-12 MED ORDER — GUAIFENESIN-DM 100-10 MG/5ML PO SYRP: 15.0000 mL | ORAL_SOLUTION | ORAL | Status: DC | PRN

## 2021-08-12 MED ORDER — 0.9 % SODIUM CHLORIDE (POUR BTL) OPTIME
TOPICAL | Status: DC | PRN
  Administered 2021-08-12: 1000 mL

## 2021-08-12 MED ORDER — DOCUSATE SODIUM 100 MG PO CAPS
100.0000 mg | ORAL_CAPSULE | Freq: Every day | ORAL | Status: DC
  Administered 2021-08-13 – 2021-08-16 (×4): 100 mg via ORAL
  Filled 2021-08-12 (×4): qty 1

## 2021-08-12 MED ORDER — SODIUM CHLORIDE 0.9 % IV SOLN
2000.0000 mg | INTRAVENOUS | Status: AC
  Administered 2021-08-12: 1000 mg via TOPICAL

## 2021-08-12 MED ORDER — ONDANSETRON HCL 4 MG/2ML IJ SOLN
INTRAMUSCULAR | Status: DC | PRN
  Administered 2021-08-12: 4 mg via INTRAVENOUS

## 2021-08-12 MED ORDER — PENTAFLUOROPROP-TETRAFLUOROETH EX AERO: 1.0000 | INHALATION_SPRAY | CUTANEOUS | Status: DC | PRN

## 2021-08-12 MED ORDER — HEPARIN SODIUM (PORCINE) 1000 UNIT/ML DIALYSIS
1000.0000 [IU] | INTRAMUSCULAR | Status: DC | PRN
Start: 2021-08-12 — End: 2021-08-21
  Administered 2021-08-14: 1000 [IU]
  Filled 2021-08-12 (×3): qty 1

## 2021-08-12 MED ORDER — FENTANYL CITRATE (PF) 250 MCG/5ML IJ SOLN
INTRAMUSCULAR | Status: AC
  Filled 2021-08-12: qty 5

## 2021-08-12 MED ORDER — PROPOFOL 10 MG/ML IV BOLUS
INTRAVENOUS | Status: DC | PRN
  Administered 2021-08-12: 100 mg via INTRAVENOUS

## 2021-08-12 MED ORDER — OXYCODONE HCL 5 MG PO TABS
10.0000 mg | ORAL_TABLET | ORAL | Status: DC | PRN
  Administered 2021-08-12: 10 mg via ORAL
  Administered 2021-08-13 – 2021-08-14 (×3): 15 mg via ORAL
  Administered 2021-08-16: 10 mg via ORAL
  Filled 2021-08-12 (×3): qty 3

## 2021-08-12 MED ORDER — ONDANSETRON HCL 4 MG/2ML IJ SOLN
4.0000 mg | Freq: Once | INTRAMUSCULAR | Status: DC | PRN
Start: 2021-08-12 — End: 2021-08-12

## 2021-08-12 MED ORDER — ACETAMINOPHEN 325 MG PO TABS: 325.0000 mg | ORAL_TABLET | Freq: Four times a day (QID) | ORAL | Status: DC | PRN

## 2021-08-12 MED ORDER — POLYETHYLENE GLYCOL 3350 17 G PO PACK: 17.0000 g | PACK | Freq: Every day | ORAL | Status: DC | PRN

## 2021-08-12 MED ORDER — HYDROMORPHONE HCL 1 MG/ML IJ SOLN
0.5000 mg | INTRAMUSCULAR | Status: DC | PRN
  Administered 2021-08-12 – 2021-08-13 (×3): 1 mg via INTRAVENOUS
  Filled 2021-08-12 (×3): qty 1

## 2021-08-12 MED ORDER — POTASSIUM CHLORIDE CRYS ER 20 MEQ PO TBCR: 20.0000 meq | EXTENDED_RELEASE_TABLET | Freq: Every day | ORAL | Status: DC | PRN

## 2021-08-12 MED ORDER — METOPROLOL TARTRATE 5 MG/5ML IV SOLN: 2.0000 mg | INTRAVENOUS | Status: DC | PRN

## 2021-08-12 SURGICAL SUPPLY — 39 items
BAG COUNTER SPONGE SURGICOUNT (BAG) ×1 IMPLANT
BLADE SAW RECIP 87.9 MT (BLADE) ×2 IMPLANT
BLADE SURG 21 STRL SS (BLADE) ×2 IMPLANT
BNDG COHESIVE 6X5 TAN STRL LF (GAUZE/BANDAGES/DRESSINGS) ×2 IMPLANT
CANISTER WOUND CARE 500ML ATS (WOUND CARE) ×2 IMPLANT
COVER SURGICAL LIGHT HANDLE (MISCELLANEOUS) ×2 IMPLANT
DRAPE DERMATAC (DRAPES) ×2 IMPLANT
DRAPE INCISE IOBAN 66X45 STRL (DRAPES) ×3 IMPLANT
DRAPE U-SHAPE 47X51 STRL (DRAPES) ×2 IMPLANT
DRESSING PRVNA BELLAFORM 24X22 (GAUZE/BANDAGES/DRESSINGS) IMPLANT
DRSG PREVENA BELLAFORM 24X22 (GAUZE/BANDAGES/DRESSINGS) ×2
DURAPREP 26ML APPLICATOR (WOUND CARE) ×2 IMPLANT
ELECT REM PT RETURN 9FT ADLT (ELECTROSURGICAL) ×2
ELECTRODE REM PT RTRN 9FT ADLT (ELECTROSURGICAL) ×1 IMPLANT
GLOVE BIOGEL PI IND STRL 7.5 (GLOVE) ×1 IMPLANT
GLOVE BIOGEL PI IND STRL 9 (GLOVE) ×1 IMPLANT
GLOVE BIOGEL PI INDICATOR 7.5 (GLOVE) ×1
GLOVE BIOGEL PI INDICATOR 9 (GLOVE) ×1
GLOVE SURG ORTHO 9.0 STRL STRW (GLOVE) ×2 IMPLANT
GLOVE SURG SS PI 6.5 STRL IVOR (GLOVE) ×2 IMPLANT
GOWN STRL REUS W/ TWL LRG LVL3 (GOWN DISPOSABLE) ×1 IMPLANT
GOWN STRL REUS W/ TWL XL LVL3 (GOWN DISPOSABLE) ×2 IMPLANT
GOWN STRL REUS W/TWL LRG LVL3 (GOWN DISPOSABLE) ×2
GOWN STRL REUS W/TWL XL LVL3 (GOWN DISPOSABLE) ×2
GRAFT SKIN WND MICRO 38 (Tissue) ×1 IMPLANT
KIT BASIN OR (CUSTOM PROCEDURE TRAY) ×2 IMPLANT
KIT TURNOVER KIT B (KITS) ×2 IMPLANT
MANIFOLD NEPTUNE II (INSTRUMENTS) ×2 IMPLANT
NS IRRIG 1000ML POUR BTL (IV SOLUTION) ×2 IMPLANT
PACK ORTHO EXTREMITY (CUSTOM PROCEDURE TRAY) ×2 IMPLANT
PAD ARMBOARD 7.5X6 YLW CONV (MISCELLANEOUS) ×2 IMPLANT
STAPLER VISISTAT 35W (STAPLE) ×1 IMPLANT
STOCKINETTE IMPERVIOUS LG (DRAPES) ×1 IMPLANT
SUT ETHILON 2 0 PSLX (SUTURE) ×3 IMPLANT
SUT SILK 2 0 (SUTURE) ×2
SUT SILK 2-0 18XBRD TIE 12 (SUTURE) ×1 IMPLANT
TOWEL GREEN STERILE FF (TOWEL DISPOSABLE) ×2 IMPLANT
TUBE CONNECTING 20X1/4 (TUBING) ×2 IMPLANT
YANKAUER SUCT BULB TIP NO VENT (SUCTIONS) ×2 IMPLANT

## 2021-08-12 NOTE — Anesthesia Postprocedure Evaluation (Signed)
Anesthesia Post Note  Patient: [name]  Procedure(s) Performed: LEFT ABOVE KNEE AMPUTATION (Left: Knee)     Patient location during evaluation: PACU Anesthesia Type: General Level of consciousness: awake and alert Pain management: pain level controlled Vital Signs Assessment: post-procedure vital signs reviewed and stable Respiratory status: spontaneous breathing, nonlabored ventilation and respiratory function stable Cardiovascular status: blood pressure returned to baseline and stable Postop Assessment: no apparent nausea or vomiting Anesthetic complications: no   No notable events documented.  Last Vitals:  Vitals:   08/12/21 1848 08/12/21 1900  BP: 118/76   Pulse: 80   Resp: (!) 22 20  Temp: 36.8 C   SpO2: 95%     Last Pain:  Vitals:   08/12/21 1900  TempSrc:   PainSc: 9                  Shayde Gervacio A.

## 2021-08-12 NOTE — Progress Notes (Signed)
Subjective  -   Patient is in the preoperative area for left below-knee amputation by Dr. Sharol Given.  I have been asked to reevaluate the patient for discoloration in bilateral fingers.   Physical Exam:  The patient has several discolored areas on multiple fingers on both hands.  There is a palpable thrill within the fistula in the left hand.  I had difficulty palpating a left radial pulse.     Assessment/Plan:    Bilateral upper extremity early ulceration: This suggest a central source.  Echo was performed today that showed a decreased ejection fraction but no obvious thrombus.  I spoke to the patient and told him that I am a little concerned about the fistula and his left arm, minimizing blood flow to the left arm that could potentially complicate healing.  I will get a ultrasound of both upper extremities tomorrow.  He will likely need formal angiography which we can do later this week.  He potentially may require ligation of his fistula.  [name] 08/12/2021 9:49 PM --  Vitals:   08/12/21 1848 08/12/21 1900  BP: 118/76   Pulse: 80   Resp: (!) 22 20  Temp: 98.2 F (36.8 C)   SpO2: 95%     Intake/Output Summary (Last 24 hours) at 08/12/2021 2149 Last data filed at 08/12/2021 2126 Gross per 24 hour  Intake 360.94 ml  Output 100 ml  Net 260.94 ml     Laboratory CBC    Component Value Date/Time   WBC 12.2 (H) 08/12/2021 0210   HGB 7.8 (L) 08/12/2021 1231   HGB 10.2 (L) 07/20/2021 1639   HCT 23.0 (L) 08/12/2021 1231   HCT 31.0 (L) 07/20/2021 1639   PLT 217 08/12/2021 0210   PLT 264 07/20/2021 1639    BMET    Component Value Date/Time   NA 133 (L) 08/12/2021 1231   NA 136 03/02/2021 1630   K 3.2 (L) 08/12/2021 1231   CL 94 (L) 08/12/2021 1231   CO2 21 (L) 08/11/2021 0757   GLUCOSE 127 (H) 08/12/2021 1231   BUN 19 08/12/2021 1231   BUN 33 (H) 03/02/2021 1630   CREATININE 3.10 (H) 08/12/2021 1231   CREATININE 0.85 05/30/2013 1100   CALCIUM 8.0 (L)  08/11/2021 0757   GFRNONAA 14 (L) 08/11/2021 0757   GFRNONAA 70 04/11/2013 0825   GFRAA 33 (L) 08/01/2019 1510   GFRAA 81 04/11/2013 0825    COAG Lab Results  Component Value Date   INR 1.4 (H) 08/11/2021   INR 1.2 05/13/2021   INR 1.3 (H) 05/12/2021   No results found for: "PTT"  Antibiotics Anti-infectives (From admission, onward)    Start     Dose/Rate Route Frequency Ordered Stop   08/12/21 1845  ceFAZolin (ANCEF) IVPB 2g/100 mL premix  Status:  Discontinued        2 g 200 mL/hr over 30 Minutes Intravenous Every 8 hours 08/12/21 1832 08/12/21 1850   08/12/21 0945  ceFAZolin (ANCEF) IVPB 2g/100 mL premix        2 g 200 mL/hr over 30 Minutes Intravenous On call to O.R. 08/12/21 0856 08/12/21 1657   08/11/21 1700  vancomycin (VANCOREADY) IVPB 750 mg/150 mL  Status:  Discontinued        750 mg 150 mL/hr over 60 Minutes Intravenous Every T-Th-Sa (Hemodialysis) 08/11/21 1619 08/11/21 1623   08/11/21 1630  vancomycin (VANCOREADY) IVPB 750 mg/150 mL        750 mg 150 mL/hr over  60 Minutes Intravenous Every Tue (Hemodialysis) 08/11/21 1623 08/11/21 1921   08/11/21 1600  ceFEPIme (MAXIPIME) 1 g in sodium chloride 0.9 % 100 mL IVPB  Status:  Discontinued        1 g 200 mL/hr over 30 Minutes Intravenous Every 24 hours 08/10/21 1136 08/12/21 1856   08/10/21 1145  vancomycin (VANCOREADY) IVPB 1500 mg/300 mL        1,500 mg 150 mL/hr over 120 Minutes Intravenous  Once 08/10/21 1136 08/10/21 1723   08/10/21 1145  ceFEPIme (MAXIPIME) 1 g in sodium chloride 0.9 % 100 mL IVPB        1 g 200 mL/hr over 30 Minutes Intravenous  Once 08/10/21 1136 08/10/21 1514   08/10/21 1135  vancomycin variable dose per unstable renal function (pharmacist dosing)  Status:  Discontinued         Does not apply See admin instructions 08/10/21 1136 08/12/21 1856   08/10/21 1100  clindamycin (CLEOCIN) capsule 600 mg        600 mg Oral  Once 08/10/21 1057 08/10/21 1528        V. Leia Alf, M.D.,  [facility] [place] Office: [phone] Pager:  [phone]

## 2021-08-12 NOTE — Anesthesia Preprocedure Evaluation (Addendum)
Anesthesia Evaluation  Patient identified by MRN, date of birth, ID band Patient awake    Reviewed: Allergy & Precautions, NPO status , Patient's Chart, lab work & pertinent test results  Airway Mallampati: III  TM Distance: >3 FB Neck ROM: Full    Dental  (+) Edentulous Upper, Edentulous Lower   Pulmonary pneumonia, resolved, Current Smoker and Patient abstained from smoking.,    Pulmonary exam normal breath sounds clear to auscultation       Cardiovascular hypertension, Pt. on medications + Past MI and + Peripheral Vascular Disease  Normal cardiovascular exam Rhythm:Regular Rate:Normal  Echo 08/12/21 1. Left ventricular ejection fraction, by estimation, is 30 to 35%. The left ventricle has moderately decreased function. The left ventricle demonstrates global hypokinesis. Left ventricular diastolic parameters are  consistent with Grade II diastolic dysfunction (pseudonormalization). There is the interventricular septum is flattened in systole, consistent with right ventricular pressure overload.  2. Right ventricular systolic function is moderately reduced. The right ventricular size is mildly enlarged. There is normal pulmonary artery systolic pressure.  3. Left atrial size was mildly dilated.  4. The mitral valve is normal in structure. Mild to moderate mitral valve regurgitation. No evidence of mitral stenosis.  5. Tricuspid valve regurgitation is moderate.  6. The aortic valve is normal in structure. Aortic valve regurgitation is not visualized. No aortic stenosis is present.  7. The inferior vena cava is dilated in size with >50% respiratory variability, suggesting right atrial pressure of 8 mmHg.  EKG 08/11/21 Normal sinus rhythm Left ventricular hypertrophy with repolarization abnormality ( Cornell product ) Prolonged QT Abnormal ECG   Neuro/Psych Peripheral neuropathy  Neuromuscular disease negative psych ROS    GI/Hepatic Neg liver ROS, GERD  Medicated and Controlled,  Endo/Other  diabetes, Poorly Controlled, Type 2  Renal/GU ESRF and DialysisRenal disease  negative genitourinary   Musculoskeletal Dry gangrene left leg   Abdominal   Peds  Hematology negative hematology ROS (+)   Anesthesia Other Findings   Reproductive/Obstetrics                           Anesthesia Physical Anesthesia Plan  ASA: 4  Anesthesia Plan: General   Post-op Pain Management: Minimal or no pain anticipated   Induction: Intravenous  PONV Risk Score and Plan: 2 and Treatment may vary due to age or medical condition and Ondansetron  Airway Management Planned: LMA  Additional Equipment: None  Intra-op Plan:   Post-operative Plan: Extubation in OR  Informed Consent: I have reviewed the patients History and Physical, chart, labs and discussed the procedure including the risks, benefits and alternatives for the proposed anesthesia with the patient or authorized representative who has indicated his/her understanding and acceptance.     Dental advisory given  Plan Discussed with: Anesthesiologist and CRNA  Anesthesia Plan Comments:        Anesthesia Quick Evaluation

## 2021-08-12 NOTE — Progress Notes (Signed)
  Echocardiogram 2D Echocardiogram has been performed.  [name] 08/12/2021, 2:46 PM

## 2021-08-12 NOTE — Anesthesia Procedure Notes (Signed)
Procedure Name: LMA Insertion Date/Time: 08/12/2021 4:55 PM  Performed by: Erick Colace, CRNAPre-anesthesia Checklist: Patient identified, Emergency Drugs available, Suction available and Patient being monitored Patient Re-evaluated:Patient Re-evaluated prior to induction Oxygen Delivery Method: Circle system utilized Preoxygenation: Pre-oxygenation with 100% oxygen Induction Type: IV induction Ventilation: Mask ventilation without difficulty LMA: LMA inserted LMA Size: 4.0 Tube type: Oral Laser Tube: Cuffed inflated with minimal occlusive pressure - saline Number of attempts: 1 Airway Equipment and Method: Stylet and Oral airway Placement Confirmation: ETT inserted through vocal cords under direct vision, positive ETCO2 and breath sounds checked- equal and bilateral Tube secured with: Tape Dental Injury: Teeth and Oropharynx as per pre-operative assessment

## 2021-08-12 NOTE — Progress Notes (Signed)
Report given to Pre Op RN Gareth Eagle. Consent in chart to be obtained in [place] with MD
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
## 2021-08-12 NOTE — Transfer of Care (Signed)
Immediate Anesthesia Transfer of Care Note  Patient: [name]  Procedure(s) Performed: LEFT ABOVE KNEE AMPUTATION (Left: Knee)  Patient Location: PACU  Anesthesia Type:General  Level of Consciousness: drowsy  Airway & Oxygen Therapy: Patient Spontanous Breathing and Patient connected to face mask oxygen  Post-op Assessment: Report given to RN and Post -op Vital signs reviewed and stable  Post vital signs: Reviewed and stable  Last Vitals:  Vitals Value Taken Time  BP 116/62 08/12/21 1745  Temp 36.6 C 08/12/21 1741  Pulse 82 08/12/21 1747  Resp 28 08/12/21 1747  SpO2 94 % 08/12/21 1747  Vitals shown include unvalidated device data.  Last Pain:  Vitals:   08/12/21 1741  TempSrc:   PainSc: Asleep         Complications: No notable events documented.

## 2021-08-12 NOTE — Op Note (Signed)
08/12/2021  5:33 PM  PATIENT:  [name]    PRE-OPERATIVE DIAGNOSIS:  dry gangrene left leg  POST-OPERATIVE DIAGNOSIS:  Same  PROCEDURE:  LEFT ABOVE KNEE AMPUTATION Application of Prevena customizable wound VAC. Application of Kerecis micro powder tissue graft 38 cm for surface area 200 cm.  SURGEON:  [name], MD  PHYSICIAN ASSISTANT:None ANESTHESIA:   General  PREOPERATIVE INDICATIONS:  [name] is a  61 y.o. male with a diagnosis of dry gangrene left leg who failed conservative measures and elected for surgical management.    The risks benefits and alternatives were discussed with the patient preoperatively including but not limited to the risks of infection, bleeding, nerve injury, cardiopulmonary complications, the need for revision surgery, among others, and the patient was willing to proceed.  OPERATIVE IMPLANTS: Kerecis micro powder 38 cm.  '@ENCIMAGES'$ @  OPERATIVE FINDINGS: Femoral artery was completely calcified.  OPERATIVE PROCEDURE: Patient was brought the operating room and underwent a general anesthetic.  After adequate levels anesthesia were obtained patient's left lower extremity was prepped using DuraPrep draped into a sterile field a timeout was called.  A fishmouth incision was made just proximal to the patella this was carried down to the intermuscular septum.  The vascular bundle medially was clamped and suture-ligated with 2-0 silk.  A reciprocating saw was used to complete the amputation of the femur the posterior flap was completed the sciatic nerve was pulled cut and allowed to retract.  Electrocautery was used for hemostasis.  The wound was filled with 38 cm of Kerecis micro powder for surface area of 200 cm.  The deep and superficial fascial layers were closed using #1 Vicryl skin was closed using staples a Praveena customizable wound VAC was applied this had a good suction fit patient was extubated taken the PACU in stable condition.   DISCHARGE  PLANNING:  Antibiotic duration: Continue antibiotics for 24 hours  Weightbearing: Nonweightbearing on the left  Pain medication: Opioid pathway  Dressing care/ Wound VAC: Continue wound VAC for 1 week at discharge  Ambulatory devices: Wheelchair  Discharge to: Anticipate discharge to skilled nursing  Follow-up: In the office 1 week post operative.

## 2021-08-12 NOTE — Progress Notes (Signed)
Progress Note    08/12/2021 11:25 AM  Subjective:  Patient was previously seen in consultation during this admission for evaluation of left foot gangrene.  He did not have any options for revascularization and thus has been scheduled for left above-the-knee amputation by Dr. Due to today 08/12/2021.  VVS has been reconsulted for evaluation of gangrenous changes to fingertips of both hands.  Patient states these lesions are painful and have only started since being admitted.  He has no history of this.  He denies fevers or chills.  White count has been elevated however patient does have worsening wounds of left foot.   Vitals:   08/12/21 0555 08/12/21 0751  BP: 134/72 120/61  Pulse: 95 90  Resp:  16  Temp: 98.9 F (37.2 C) 98.4 F (36.9 C)  SpO2: 97% 98%   Physical Exam: Cardiac: Borderline tachycardic Lungs: Nonlabored Extremities: Palpable right radial pulse; palpable thrill left arm AV fistula; 1+ palpable left radial pulse with compression of the left arm AV fistula Neurologic: A&O  CBC    Component Value Date/Time   WBC 12.2 (H) 08/12/2021 0210   RBC 2.78 (L) 08/12/2021 0210   HGB 7.9 (L) 08/12/2021 0210   HGB 10.2 (L) 07/20/2021 1639   HCT 24.0 (L) 08/12/2021 0210   HCT 31.0 (L) 07/20/2021 1639   PLT 217 08/12/2021 0210   PLT 264 07/20/2021 1639   MCV 86.3 08/12/2021 0210   MCV 87 07/20/2021 1639   MCH 28.4 08/12/2021 0210   MCHC 32.9 08/12/2021 0210   RDW 13.5 08/12/2021 0210   RDW 13.1 07/20/2021 1639   LYMPHSABS 0.9 08/12/2021 0210   LYMPHSABS 1.7 02/03/2021 1634   MONOABS 0.7 08/12/2021 0210   EOSABS 0.0 08/12/2021 0210   EOSABS 0.2 02/03/2021 1634   BASOSABS 0.0 08/12/2021 0210   BASOSABS 0.0 02/03/2021 1634    BMET    Component Value Date/Time   NA 130 (L) 08/11/2021 0757   NA 136 03/02/2021 1630   K 3.2 (L) 08/11/2021 0757   CL 93 (L) 08/11/2021 0757   CO2 21 (L) 08/11/2021 0757   GLUCOSE 186 (H) 08/11/2021 0757   BUN 30 (H) 08/11/2021 0757    BUN 33 (H) 03/02/2021 1630   CREATININE 4.64 (H) 08/11/2021 0757   CREATININE 0.85 05/30/2013 1100   CALCIUM 8.0 (L) 08/11/2021 0757   GFRNONAA 14 (L) 08/11/2021 0757   GFRNONAA 70 04/11/2013 0825   GFRAA 33 (L) 08/01/2019 1510   GFRAA 81 04/11/2013 0825    INR    Component Value Date/Time   INR 1.4 (H) 08/11/2021 0757     Intake/Output Summary (Last 24 hours) at 08/12/2021 1125 Last data filed at 08/12/2021 1100 Gross per 24 hour  Intake 90.94 ml  Output 0 ml  Net 90.94 ml     Assessment/Plan:  61 y.o. male with gangrenous changes to fingertips of both hands since admission  Right hand well-perfused with palpable radial pulse; left hand warm and well-perfused with palpable radial pulse with compression of left arm AV fistula Given these painful lesions are on both hands, I am concerned for cardiac etiology.  An echocardiogram has been ordered.  If fingertips worsen on the left hand and echocardiogram is negative, fistula can be ligated to restore blood flow to left hand in the future. Vascular will continue to follow; on call vascular surgeon Dr. Trula Slade will evaluate the patient later today and provide further treatment plans   Dagoberto Ligas, PA-C [facility]  [phone] 08/12/2021 11:25 AM

## 2021-08-12 NOTE — Interval H&P Note (Signed)
History and Physical Interval Note:  08/12/2021 6:33 AM  [name]  has presented today for surgery, with the diagnosis of dry gangrene left leg.  The various methods of treatment have been discussed with the patient and family. After consideration of risks, benefits and other options for treatment, the patient has consented to  Procedure(s): LEFT ABOVE KNEE AMPUTATION (Left) as a surgical intervention.  The patient's history has been reviewed, patient examined, no change in status, stable for surgery.  I have reviewed the patient's chart and labs.  Questions were answered to the patient's satisfaction.     [name]

## 2021-08-12 NOTE — Progress Notes (Signed)
Nursing team page regarding low blood pressure with standing to urinate. Patient became dizzy and his BP was 74/25. He never lost consciousness per nursing. He was placed in trendelenburg and his BP improved to 96/46. His dizziness also improved.   Of note, upon arrival to bedside patient was also concerned about some hand numbing pain that he has been experiencing. He states the pain has been present since he presented to the hospital. He has also noticed that his hands have felt colder and he has noticed dark spots at the tips of some of his fingertips.   On exam:     08/12/2021    1:50 AM 08/12/2021    1:36 AM 08/12/2021    1:22 AM  Vitals with BMI  Systolic 147 829 562  Diastolic 61 57 54    His blood pressures were improved. He was A&O and not confused.  Pulm: Non labored breathing on RA R hand, picture in chart palpable radial, dopplerable radial, ulna, and palmar arch. L hand radial 1+, dopplerable radial and ulna.   Plan: Patient received HD 8/8 minimal fluid was removed. However, given his response to being in trendelenburg he still may require some volume. Will be judicious with volume given his ESRD and give 1x dose of albumin 25g.   Regarding his fingertips, it appears the patient is developing early gangrenous lesions. Vascular surgery already assessed patient for his legs. Do not believe that he needs emergent assessment but would have them see his hands on rounds to see if they warrant intervention.

## 2021-08-12 NOTE — Progress Notes (Signed)
S/p AKA. Got abx yesterday and today. ESRD so it would provide for 24 hrs post HD.  Onnie Boer, PharmD, BCIDP, AAHIVP, CPP Infectious Disease Pharmacist 08/12/2021 6:57 PM

## 2021-08-12 NOTE — Progress Notes (Signed)
MD notified of patients blood pressure of 74/25. Patient stood up to use the urinal and became dizzy, immediately laid patient down and placed him in [place] patients blood pressure then came up to 96/46. Patient reported feeling much better. MD came to patient bedside. See new orders

## 2021-08-12 NOTE — Progress Notes (Signed)
[name] Progress Note   Subjective: Completed dialysis yesterday with no UF. For BKA today  Objective Vitals:   08/12/21 0235 08/12/21 0328 08/12/21 0555 08/12/21 0751  BP: 131/84 126/67 134/72 120/61  Pulse:  90 95 90  Resp:  18  16  Temp:  98.6 F (37 C) 98.9 F (37.2 C) 98.4 F (36.9 C)  TempSrc:  Oral Oral Oral  SpO2:  96% 97% 98%  Weight:      Height:         Additional Objective Labs: Basic Metabolic Panel: Recent Labs  Lab 08/10/21 0302 08/11/21 0757  NA 125* 130*  K 3.5 3.2*  CL 86* 93*  CO2 22 21*  GLUCOSE 227* 186*  BUN 20 30*  CREATININE 4.16* 4.64*  CALCIUM 8.4* 8.0*  PHOS  --  3.7    CBC: Recent Labs  Lab 08/10/21 0302 08/11/21 0757 08/12/21 0210  WBC 21.2* 23.2* 12.2*  NEUTROABS  --   --  10.5*  HGB 10.6* 7.7* 7.9*  HCT 33.2* 22.7* 24.0*  MCV 88.8 85.7 86.3  PLT 224 293 217    Blood Culture    Component Value Date/Time   SDES BLOOD SITE NOT SPECIFIED 08/10/2021 1416   SDES BLOOD SITE NOT SPECIFIED 08/10/2021 1416   SPECREQUEST  08/10/2021 1416    BOTTLES DRAWN AEROBIC ONLY Blood Culture adequate volume   SPECREQUEST  08/10/2021 1416    BOTTLES DRAWN AEROBIC ONLY Blood Culture adequate volume   CULT  08/10/2021 1416    NO GROWTH 2 DAYS Performed at [facility] [address] [place], [place] [address]    CULT  08/10/2021 1416    NO GROWTH 2 DAYS Performed at [facility] [address] [place], [place] [address]    REPTSTATUS PENDING 08/10/2021 1416   REPTSTATUS PENDING 08/10/2021 1416     Physical Exam General: Alert, nad Heart: RRR Lungs: Clear, bilaterally  Abdomen: soft non-tender Extremities: L TMA with ischemic changes; R BKA. No sig edema  Dialysis Access: TDC; L AVF (placed 07/02/21)  Medications:  anticoagulant sodium citrate      ceFAZolin (ANCEF) IV     ceFEPime (MAXIPIME) IV      chlorhexidine  60 mL Topical Once   Chlorhexidine Gluconate Cloth  6 each Topical Q0600    darbepoetin (ARANESP) injection - DIALYSIS  40 mcg Intravenous Q Wed-HD   heparin injection (subcutaneous)  5,000 Units Subcutaneous Q8H   insulin aspart  0-5 Units Subcutaneous QHS   insulin aspart  0-6 Units Subcutaneous TID WC   melatonin  5 mg Oral QHS   mupirocin ointment  1 Application Nasal BID   povidone-iodine  2 Application Topical Once   tranexamic acid (CYKLOKAPRON) 2,000 mg in sodium chloride 0.9 % 50 mL Topical Application  8,469 mg Topical To OR   vancomycin variable dose per unstable renal function (pharmacist dosing)   Does not apply See admin instructions    Dialysis Orders:  Unit: [facility] MWF Time: 4:00  EDW: 72 kg  Flows: 400/700  [place]: 3K/2.5 Ca  Access: TDC ; L AVF (placed 07/02/21)  Heparin: No bolus heparin ESA: None  VDRA: Hectorol 3 TIW    Assessment/Plan: Left limb gangrene/ischemia.  Vascular/Ortho consulted. Plan for left BKA per Dr. Sharol Given. Antibiotics started per primary team.  ESRD -  HD MWF. Not adhearent to Rx as outpatient. HD off schedule on 8/8.  Back on schedule today- work around surgery schedule.  Hypertension/volume  -  Came in hypertensive, now having hypotensive episodes. Holding BP meds now.  Is at dry weight. No volume excess. Minimal UF with HD.  Anemia  - Hgb 10.6 >7.9. Will order ESA with next HD. Tsat 31%  Metabolic bone disease -  Calcium/phos in range. Continue home meds. DMT2 -insulin per primary GERD - on protonix  Nutrition - Renal diet, add prot supp for low albumin when eating.     [name] Child PA-C [facility] 08/12/2021,11:44 AM

## 2021-08-13 ENCOUNTER — Inpatient Hospital Stay (HOSPITAL_COMMUNITY): Payer: Medicare Other

## 2021-08-13 ENCOUNTER — Other Ambulatory Visit (HOSPITAL_COMMUNITY): Payer: Self-pay

## 2021-08-13 ENCOUNTER — Encounter (HOSPITAL_COMMUNITY): Payer: Self-pay | Admitting: Orthopedic Surgery

## 2021-08-13 ENCOUNTER — Other Ambulatory Visit: Payer: Self-pay

## 2021-08-13 DIAGNOSIS — N186 End stage renal disease: Secondary | ICD-10-CM | POA: Diagnosis not present

## 2021-08-13 DIAGNOSIS — T8743 Infection of amputation stump, right lower extremity: Secondary | ICD-10-CM | POA: Diagnosis not present

## 2021-08-13 DIAGNOSIS — I70262 Atherosclerosis of native arteries of extremities with gangrene, left leg: Secondary | ICD-10-CM | POA: Diagnosis not present

## 2021-08-13 DIAGNOSIS — L0390 Cellulitis, unspecified: Secondary | ICD-10-CM | POA: Diagnosis not present

## 2021-08-13 DIAGNOSIS — E1152 Type 2 diabetes mellitus with diabetic peripheral angiopathy with gangrene: Secondary | ICD-10-CM | POA: Diagnosis not present

## 2021-08-13 LAB — GLUCOSE, CAPILLARY
Glucose-Capillary: 111 mg/dL — ABNORMAL HIGH (ref 70–99)
Glucose-Capillary: 142 mg/dL — ABNORMAL HIGH (ref 70–99)
Glucose-Capillary: 146 mg/dL — ABNORMAL HIGH (ref 70–99)
Glucose-Capillary: 147 mg/dL — ABNORMAL HIGH (ref 70–99)

## 2021-08-13 LAB — BASIC METABOLIC PANEL
Anion gap: 11 (ref 5–15)
BUN: 11 mg/dL (ref 8–23)
CO2: 28 mmol/L (ref 22–32)
Calcium: 8 mg/dL — ABNORMAL LOW (ref 8.9–10.3)
Chloride: 96 mmol/L — ABNORMAL LOW (ref 98–111)
Creatinine, Ser: 2.17 mg/dL — ABNORMAL HIGH (ref 0.61–1.24)
GFR, Estimated: 34 mL/min — ABNORMAL LOW (ref >60)
Glucose, Bld: 127 mg/dL — ABNORMAL HIGH (ref 70–99)
Potassium: 3.5 mmol/L (ref 3.5–5.1)
Sodium: 135 mmol/L (ref 135–145)

## 2021-08-13 LAB — CBC
HCT: 28.7 % — ABNORMAL LOW (ref 39.0–52.0)
Hemoglobin: 9.2 g/dL — ABNORMAL LOW (ref 13.0–17.0)
MCH: 28.1 pg (ref 26.0–34.0)
MCHC: 32.1 g/dL (ref 30.0–36.0)
MCV: 87.8 fL (ref 80.0–100.0)
Platelets: 245 10*3/uL (ref 150–400)
RBC: 3.27 MIL/uL — ABNORMAL LOW (ref 4.22–5.81)
RDW: 14.8 % (ref 11.5–15.5)
WBC: 11 10*3/uL — ABNORMAL HIGH (ref 4.0–10.5)
nRBC: 0 % (ref 0.0–0.2)

## 2021-08-13 MED ORDER — PROSOURCE PLUS PO LIQD
30.0000 mL | Freq: Two times a day (BID) | ORAL | Status: DC
  Administered 2021-08-13 – 2021-08-20 (×12): 30 mL via ORAL
  Filled 2021-08-13 (×13): qty 30

## 2021-08-13 NOTE — Progress Notes (Signed)
Bilateral upper extremity arterial duplex has been completed. Preliminary results can be found in CV Proc through chart review.   08/13/21 12:26 PM [name] RVT

## 2021-08-13 NOTE — Progress Notes (Signed)
Patient ID: [name], male   DOB: [birth year], 61 y.o.   MRN: [id number] Patient is postoperative day 1 left above-the-knee amputation.  Patient's femoral artery was completely calcified.  Wound VAC has no drainage.  Anticipate discharge to skilled nursing.  Discontinue the wound VAC and apply dry dressing at the time of discharge.

## 2021-08-13 NOTE — Care Management Important Message (Signed)
Important Message  Patient Details  Name: [name] MRN: [id number] Date of Birth: 1960/02/26   Medicare Important Message Given:  Yes     Hannah Beat 08/13/2021, 11:03 AM
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
## 2021-08-13 NOTE — Evaluation (Signed)
Occupational Therapy Evaluation Patient Details Name: [name] MRN: [id number] DOB: 1960/07/26 Today's Date: 08/13/2021   History of Present Illness Pt is a 61 yr old male who presented 08/10/21 due to weakness. Pt found to have dry gangrene of LLE. Pt s/p L AKA on 08/12/21. PMH: ESRD on HD, DM2, CHF, HLD, HLD, R BKA and L foot partial amputation in 2015   Clinical Impression   Pt  in first attempt was oriented x0 as reporting they were "[place]". Pt then on second session was able to report name but nothing further. Pt in session would become very distracted and following one step commands inconsistently. Pt was limited due to this in session to complete tasks. Pt was able to complete supine to sitting with mod-max x2, sitting at EOB with mod A to min guard and supine to sitting with min to mod assist x2. Pt currently with functional limitations due to the deficits listed below (see OT Problem List).  Pt will benefit from skilled OT to increase their safety and independence with ADL and functional mobility for ADL to facilitate discharge to venue listed below.        Recommendations for follow up therapy are one component of a multi-disciplinary discharge planning process, led by the attending physician.  Recommendations may be updated based on patient status, additional functional criteria and insurance authorization.   Follow Up Recommendations  Skilled nursing-short term rehab (<3 hours/day)    Assistance Recommended at Discharge Frequent or constant Supervision/Assistance  Patient can return home with the following Two people to help with walking and/or transfers;A lot of help with bathing/dressing/bathroom;Assistance with cooking/housework;Assistance with feeding;Direct supervision/assist for medications management;Direct supervision/assist for financial management;Assist for transportation    Functional Status Assessment  Patient has had a recent decline in their functional status and  demonstrates the ability to make significant improvements in function in a reasonable and predictable amount of time.  Equipment Recommendations   (TBD)    Recommendations for Other Services       Precautions / Restrictions Precautions Precautions: Fall Restrictions Weight Bearing Restrictions: Yes LLE Weight Bearing: Non weight bearing Other Position/Activity Restrictions: wound vac      Mobility Bed Mobility Overal bed mobility: Needs Assistance Bed Mobility: Rolling, Supine to Sit, Sit to Supine Rolling: Mod assist, Max assist   Supine to sit: Mod assist, Max assist, +2 for physical assistance, +2 for safety/equipment Sit to supine: Min assist, +2 for physical assistance, +2 for safety/equipment        Transfers                   General transfer comment: attempted to complete transfers but declined      Balance Overall balance assessment: Needs assistance Sitting-balance support: Bilateral upper extremity supported, Single extremity supported, No upper extremity supported Sitting balance-Leahy Scale: Fair   Postural control: Posterior lean                                 ADL either performed or assessed with clinical judgement   ADL Overall ADL's : Needs assistance/impaired Eating/Feeding: Set up;Sitting   Grooming: Wash/dry hands;Wash/dry face;Set up;Cueing for safety;Cueing for sequencing;Sitting   Upper Body Bathing: Moderate assistance;Sitting;Bed level   Lower Body Bathing: Maximal assistance;+2 for physical assistance;+2 for safety/equipment;Bed level   Upper Body Dressing : Moderate assistance;Cueing for safety;Cueing for sequencing;Sitting;Bed level   Lower Body Dressing: Maximal assistance;Cueing for safety;Cueing for  sequencing;Bed level                 General ADL Comments: Deffered transfers for further sessions     Vision         Perception     Praxis      Pertinent Vitals/Pain Pain Assessment Pain  Assessment: Faces Faces Pain Scale: Hurts even more Pain Location: LLE Pain Descriptors / Indicators: Discomfort, Grimacing, Guarding Pain Intervention(s): Limited activity within patient's tolerance     Hand Dominance Right   Extremity/Trunk Assessment Upper Extremity Assessment Upper Extremity Assessment: Overall WFL for tasks assessed   Lower Extremity Assessment Lower Extremity Assessment: Defer to PT evaluation       Communication Communication Communication: No difficulties   Cognition Arousal/Alertness: Awake/alert Behavior During Therapy: Restless, Agitated, Impulsive Overall Cognitive Status: Difficult to assess                                 General Comments: Pt would occaionally follow one step comands. Pt would make comments lets do whatever you want but then also this lady is crazy when attempting to work with pt.     General Comments       Exercises     Shoulder Instructions      Home Living Family/patient expects to be discharged to:: Private residence Living Arrangements: Alone Available Help at Discharge: Family Type of Home: Apartment Home Access: Stairs to enter     Clint: One level     Bathroom Shower/Tub: International aid/development worker Accessibility: No   Home Equipment: [place] - single point;Tub bench;Other (comment);Rolling Walker (2 wheels)          Prior Functioning/Environment Prior Level of Function : Independent/Modified Independent             Mobility Comments: Pt reported that they were not using any AE          OT Problem List: Decreased strength;Decreased range of motion;Decreased activity tolerance;Impaired balance (sitting and/or standing);Decreased safety awareness;Decreased knowledge of use of DME or AE;Cardiopulmonary status limiting activity;Pain      OT Treatment/Interventions: Self-care/ADL training;Therapeutic exercise;DME and/or AE instruction;Therapeutic activities;Patient/family  education;Balance training    OT Goals(Current goals can be found in the care plan section) Acute Rehab OT Goals Patient Stated Goal: none reported OT Goal Formulation: With patient Time For Goal Achievement: 08/27/21 Potential to Achieve Goals: Fair  OT Frequency: Min 2X/week    Co-evaluation              AM-PAC OT "6 Clicks" Daily Activity     Outcome Measure Help from another person eating meals?: A Little Help from another person taking care of personal grooming?: A Little Help from another person toileting, which includes using toliet, bedpan, or urinal?: A Lot Help from another person bathing (including washing, rinsing, drying)?: A Lot Help from another person to put on and taking off regular upper body clothing?: A Lot Help from another person to put on and taking off regular lower body clothing?: A Lot 6 Click Score: 14   End of Session Equipment Utilized During Treatment: Gait belt  Activity Tolerance: Other (comment) (following commands, behaviors, saftey with tasks) Patient left: in bed;with call bell/phone within reach;with bed alarm set  OT Visit Diagnosis: Other abnormalities of gait and mobility (R26.89);Unsteadiness on feet (R26.81);Muscle weakness (generalized) (M62.81);Pain Pain - Right/Left: Left Pain - part of body: Leg  Time: 1427-6701 OT Time Calculation (min): 45 min Charges:  OT General Charges $OT Visit: 1 Visit OT Evaluation $OT Eval Moderate Complexity: 1 Mod OT Treatments $Self Care/Home Management : 8-22 mins  Joeseph Amor OTR/L  Acute Rehab Services  [phone] office number [phone] pager number   Joeseph Amor 08/13/2021, 12:06 PM

## 2021-08-13 NOTE — Progress Notes (Signed)
[facility] Progress Note   Subjective:  Seen in room. Pain controlled s/p L AKA. A little groggy, slow to answer questions. Completed dialysis early this am.   Objective Vitals:   08/13/21 0754 08/13/21 0824 08/13/21 0855 08/13/21 0924  BP: 131/68 (!) 145/71 (!) 152/72 (!) 151/74  Pulse:   60   Resp: 11 (!) '24 13 19  '$ Temp:   98 F (36.7 C)   TempSrc:   Oral   SpO2: 94% 90% 94% 100%  Weight:      Height:         Additional Objective Labs: Basic Metabolic Panel: Recent Labs  Lab 08/10/21 0302 08/11/21 0757 08/12/21 1231 08/13/21 0719  NA 125* 130* 133* 135  K 3.5 3.2* 3.2* 3.5  CL 86* 93* 94* 96*  CO2 22 21*  --  28  GLUCOSE 227* 186* 127* 127*  BUN 20 30* 19 11  CREATININE 4.16* 4.64* 3.10* 2.17*  CALCIUM 8.4* 8.0*  --  8.0*  PHOS  --  3.7  --   --     CBC: Recent Labs  Lab 08/10/21 0302 08/11/21 0757 08/12/21 0210 08/12/21 1231 08/13/21 0719  WBC 21.2* 23.2* 12.2*  --  11.0*  NEUTROABS  --   --  10.5*  --   --   HGB 10.6* 7.7* 7.9* 7.8* 9.2*  HCT 33.2* 22.7* 24.0* 23.0* 28.7*  MCV 88.8 85.7 86.3  --  87.8  PLT 224 293 217  --  245    Blood Culture    Component Value Date/Time   SDES BLOOD SITE NOT SPECIFIED 08/10/2021 1416   SDES BLOOD SITE NOT SPECIFIED 08/10/2021 1416   SPECREQUEST  08/10/2021 1416    BOTTLES DRAWN AEROBIC ONLY Blood Culture adequate volume   SPECREQUEST  08/10/2021 1416    BOTTLES DRAWN AEROBIC ONLY Blood Culture adequate volume   CULT  08/10/2021 1416    NO GROWTH 3 DAYS Performed at [facility], [place] [address], [place], [place] [address]    CULT  08/10/2021 1416    NO GROWTH 3 DAYS Performed at [facility], [place] [address] [place], [place] [address]    REPTSTATUS PENDING 08/10/2021 1416   REPTSTATUS PENDING 08/10/2021 1416     Physical Exam General: Alert, nad Heart: RRR Lungs: Clear, bilaterally  Abdomen: soft non-tender Extremities: L AKA with wound VAC;  R BKA.  Dialysis Access:  [facility]; L AVF (placed 07/02/21)  Medications:  sodium chloride Stopped (08/13/21 0009)   anticoagulant sodium citrate     magnesium sulfate bolus IVPB      vitamin C  1,000 mg Oral Daily   Chlorhexidine Gluconate Cloth  6 each Topical Q0600   darbepoetin (ARANESP) injection - DIALYSIS  40 mcg Intravenous Q Wed-HD   docusate sodium  100 mg Oral Daily   heparin injection (subcutaneous)  5,000 Units Subcutaneous Q8H   insulin aspart  0-5 Units Subcutaneous QHS   insulin aspart  0-6 Units Subcutaneous TID WC   melatonin  5 mg Oral QHS   mupirocin ointment  1 Application Nasal BID   nutrition supplement (JUVEN)  1 packet Oral BID BM   pantoprazole  40 mg Oral Daily   zinc sulfate  220 mg Oral Daily    Dialysis Orders:  Unit: [place] MWF Time: 4:00  EDW: 72 kg  Flows: 400/700  Bath: 3K/2.5 Ca  Access: TDC ; L AVF (placed 07/02/21)  Heparin: No bolus heparin ESA: None  VDRA: Hectorol 3 TIW    Assessment/Plan: Left limb gangrene/ischemia.  Vascular/Ortho consulted. 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 Per Dr. Sharol Given.  ESRD -  HD MWF. Not adhearent to Rx as outpatient. Back on schedule. Next HD 8/11.  Hand ischemia - VVS following. Pending arterial duplex  Hypertension/volume  - BP has been labile here.  Holding BP meds now.  Is at dry weight. No volume excess. Minimal UF with HD.  Anemia  - Hgb 9.2 s/p prbcs. Aranesp 40 given 8/10. Tsat 85%  Metabolic bone disease -  Corr Calcium/phos in range. Continue home meds. DMT2 -insulin per primary GERD - on protonix  Nutrition - Renal diet, add prot supps Dispo - PT recommending SNF     Lynnda Child PA-C [facility] 08/13/2021,11:09 AM

## 2021-08-13 NOTE — Progress Notes (Addendum)
Pt is transported to Dialysis with transport. Pt a/o times 4 resp even and unlabored and vitals are WNL.   7127 Report received from Dialysis nurse that Pt treatment is complete and tolerated well. 1L off BP stable, Pain meds received at 0047. Vitals are WNL.  Pt returned back to 5N 20. PT tolerated treatment but c/o 8/10 pain. R stump AKA pain.

## 2021-08-13 NOTE — Evaluation (Signed)
Physical Therapy Evaluation Patient Details Name: [name] MRN: [id number] DOB: 09-Jun-1960 Today's Date: 08/13/2021  History of Present Illness  Pt is a 61 yr old male who presented 08/10/21 due to weakness. Pt found to have dry gangrene of LLE. Pt s/p L AKA on 08/12/21. PMH: ESRD on HD, DM2, CHF, HLD, HLD, R BKA and L foot partial amputation in 2015  Clinical Impression  Pt was seen for mobility and is struggling with being able to move, both with sequencing and planning movement as well as tolerance with LLE due to pain.  He is very inert, but finally was assisted to sit up and then unable to attempt standing.  With pt's limitations of flexibility and strength as well as tolerance to stand, will recommend SNF to recovery more independence and increase safety to make a transition to home feasible.  Pt is currently in a very cognitively confused state, which will need to clear as well for a safe transition home.  Recommend him to also be encouraged to be up in a chair as he is safely able to permit, speaking from cognitive awareness.     Recommendations for follow up therapy are one component of a multi-disciplinary discharge planning process, led by the attending physician.  Recommendations may be updated based on patient status, additional functional criteria and insurance authorization.  Follow Up Recommendations Skilled nursing-short term rehab (<3 hours/day) Can patient physically be transported by private vehicle: No    Assistance Recommended at Discharge Frequent or constant Supervision/Assistance  Patient can return home with the following  Two people to help with walking and/or transfers;Two people to help with bathing/dressing/bathroom;Direct supervision/assist for medications management;Direct supervision/assist for financial management;Assist for transportation;Help with stairs or ramp for entrance    Equipment Recommendations None recommended by PT  Recommendations for Other  Services       Functional Status Assessment Patient has had a recent decline in their functional status and demonstrates the ability to make significant improvements in function in a reasonable and predictable amount of time.     Precautions / Restrictions Precautions Precautions: Fall Precaution Comments: confused Restrictions Weight Bearing Restrictions: Yes LLE Weight Bearing: Non weight bearing Other Position/Activity Restrictions: wound vac      Mobility  Bed Mobility Overal bed mobility: Needs Assistance Bed Mobility: Rolling, Supine to Sit, Sit to Supine Rolling: Mod assist, Max assist   Supine to sit: Mod assist, Max assist, +2 for physical assistance, +2 for safety/equipment Sit to supine: Mod assist, +2 for physical assistance, +2 for safety/equipment   General bed mobility comments: pt is getting up with help and then sits without moving despite agreeing to try    Transfers Overall transfer level: Needs assistance                 General transfer comment: pt would not let therapists initiate standing with him    Ambulation/Gait               General Gait Details: unable to try  Stairs            Wheelchair Mobility    Modified Rankin (Stroke Patients Only)       Balance Overall balance assessment: Needs assistance Sitting-balance support: Bilateral upper extremity supported Sitting balance-Leahy Scale: Fair   Postural control: Posterior lean     Standing balance comment: pt declined to try to stand  Pertinent Vitals/Pain Pain Assessment Pain Assessment: Faces Faces Pain Scale: Hurts even more Pain Location: LLE Pain Descriptors / Indicators: Guarding, Grimacing, Aching Pain Intervention(s): Limited activity within patient's tolerance, Monitored during session, Premedicated before session, Repositioned    Home Living Family/patient expects to be discharged to:: Private  residence Living Arrangements: Alone Available Help at Discharge: Family Type of Home: Apartment Home Access: Stairs to enter       Home Layout: One level Home Equipment: [place] - single point;Tub bench;Other (comment);Rolling Walker (2 wheels) (BLE BK prosthetics) Additional Comments: per pt was walking without help up steps to enter house    Prior Function Prior Level of Function : Independent/Modified Independent             Mobility Comments: BLE prosthetics but no AD       Hand Dominance   Dominant Hand: Right    Extremity/Trunk Assessment   Upper Extremity Assessment Upper Extremity Assessment: Defer to OT evaluation    Lower Extremity Assessment Lower Extremity Assessment: Generalized weakness;LLE deficits/detail LLE Deficits / Details: new AK amputation with pain to do any moving despite meds LLE: Unable to fully assess due to pain LLE Coordination: decreased gross motor    Cervical / Trunk Assessment Cervical / Trunk Assessment: Normal  Communication   Communication: No difficulties  Cognition Arousal/Alertness: Awake/alert Behavior During Therapy: Impulsive, Agitated, Anxious Overall Cognitive Status: No family/caregiver present to determine baseline cognitive functioning                                          General Comments General comments (skin integrity, edema, etc.): pt is delayed to follow instructions, after a lengthy time could not be assisted to move without directly intervening and did not get him to try to stand    Exercises     Assessment/Plan    PT Assessment Patient needs continued PT services  PT Problem List Decreased strength;Decreased activity tolerance;Decreased balance;Decreased mobility;Decreased coordination;Decreased knowledge of use of DME;Decreased safety awareness;Decreased cognition;Decreased knowledge of precautions;Decreased skin integrity;Pain       PT Treatment Interventions DME instruction;Gait  training;Functional mobility training;Therapeutic activities;Therapeutic exercise;Balance training;Neuromuscular re-education;Patient/family education    PT Goals (Current goals can be found in the Care Plan section)  Acute Rehab PT Goals Patient Stated Goal: to manage his LLE PT Goal Formulation: Patient unable to participate in goal setting Time For Goal Achievement: 08/27/21 Potential to Achieve Goals: Fair    Frequency Min 3X/week     Co-evaluation               AM-PAC PT "6 Clicks" Mobility  Outcome Measure Help needed turning from your back to your side while in a flat bed without using bedrails?: Total Help needed moving from lying on your back to sitting on the side of a flat bed without using bedrails?: Total Help needed moving to and from a bed to a chair (including a wheelchair)?: Total Help needed standing up from a chair using your arms (e.g., wheelchair or bedside chair)?: Total Help needed to walk in hospital room?: Total Help needed climbing 3-5 steps with a railing? : Total 6 Click Score: 6    End of Session Equipment Utilized During Treatment: Gait belt Activity Tolerance: Patient limited by fatigue;Patient limited by pain Patient left: in bed;with call bell/phone within reach;with bed alarm set Nurse Communication: Mobility status PT Visit Diagnosis: Muscle weakness (generalized) (  M62.81);Other abnormalities of gait and mobility (R26.89);Difficulty in walking, not elsewhere classified (R26.2);Pain Pain - Right/Left: Left Pain - part of body: Hip    Time: 2103-1281 PT Time Calculation (min) (ACUTE ONLY): 33 min   Charges:   PT Evaluation $PT Eval Moderate Complexity: 1 Mod         Ramond Dial 08/13/2021, 4:57 PM  Mee Hives, PT PhD Acute Rehab Dept. Number: [place]

## 2021-08-13 NOTE — Progress Notes (Signed)
Notified Christene Slates, MD' Pager: [place] and Sanjuan Dame, MD Pager: PB [phone].  Because CCMD called about ST elevation in the [facility] 3.0.

## 2021-08-13 NOTE — Progress Notes (Signed)
[facility] [employer]  Subjective  - Post op pain left LE amputation, no change in hand wounds   Objective (!) 150/72 92 98.9 F (37.2 C) (Oral) 14 100%  Intake/Output Summary (Last 24 hours) at 08/13/2021 0739 Last data filed at 08/13/2021 0500 Gross per 24 hour  Intake 550.31 ml  Output 1250 ml  Net -699.69 ml         Assessment/Planning: ESRD with left AV fistula B hand/finger skin changes  Pending arterial duplex to check out flow to hands and fingers B   Roxy Horseman 08/13/2021 7:39 AM --  Laboratory Lab Results: Recent Labs    08/11/21 0757 08/12/21 0210 08/12/21 1231  WBC 23.2* 12.2*  --   HGB 7.7* 7.9* 7.8*  HCT 22.7* 24.0* 23.0*  PLT 293 217  --    BMET Recent Labs    08/11/21 0757 08/12/21 1231  NA 130* 133*  K 3.2* 3.2*  CL 93* 94*  CO2 21*  --   GLUCOSE 186* 127*  BUN 30* 19  CREATININE 4.64* 3.10*  CALCIUM 8.0*  --     COAG Lab Results  Component Value Date   INR 1.4 (H) 08/11/2021   INR 1.2 05/13/2021   INR 1.3 (H) 05/12/2021   No results found for: "PTT"

## 2021-08-13 NOTE — Progress Notes (Signed)
Received patient in bed to unit.  Alert and oriented.  Informed consent signed and in  chart.   Treatment initiated: 0036 Treatment completed: 0411  Patient tolerated well.  Transported back to the room  alert, without acute distress.  Report given to patient's nurse.   Access used: CVC Access issues: no issues  Total UF removed: 1L Medication(s) given: Aranesp 47mg; Oxycodone '15mg'$   Post HD VS: 98.9-150/72-92-14-98% RA Post HD weight: 65 kg   [name]

## 2021-08-14 DIAGNOSIS — I739 Peripheral vascular disease, unspecified: Secondary | ICD-10-CM | POA: Diagnosis not present

## 2021-08-14 DIAGNOSIS — T8744 Infection of amputation stump, left lower extremity: Secondary | ICD-10-CM | POA: Diagnosis not present

## 2021-08-14 DIAGNOSIS — I96 Gangrene, not elsewhere classified: Secondary | ICD-10-CM | POA: Diagnosis not present

## 2021-08-14 DIAGNOSIS — E1140 Type 2 diabetes mellitus with diabetic neuropathy, unspecified: Secondary | ICD-10-CM

## 2021-08-14 DIAGNOSIS — N186 End stage renal disease: Secondary | ICD-10-CM | POA: Diagnosis not present

## 2021-08-14 LAB — CBC
HCT: 29.1 % — ABNORMAL LOW (ref 39.0–52.0)
Hemoglobin: 9.4 g/dL — ABNORMAL LOW (ref 13.0–17.0)
MCH: 28.2 pg (ref 26.0–34.0)
MCHC: 32.3 g/dL (ref 30.0–36.0)
MCV: 87.4 fL (ref 80.0–100.0)
Platelets: 258 10*3/uL (ref 150–400)
RBC: 3.33 MIL/uL — ABNORMAL LOW (ref 4.22–5.81)
RDW: 14.7 % (ref 11.5–15.5)
WBC: 11.2 10*3/uL — ABNORMAL HIGH (ref 4.0–10.5)
nRBC: 0 % (ref 0.0–0.2)

## 2021-08-14 LAB — BASIC METABOLIC PANEL
Anion gap: 11 (ref 5–15)
BUN: 30 mg/dL — ABNORMAL HIGH (ref 8–23)
CO2: 25 mmol/L (ref 22–32)
Calcium: 7.9 mg/dL — ABNORMAL LOW (ref 8.9–10.3)
Chloride: 97 mmol/L — ABNORMAL LOW (ref 98–111)
Creatinine, Ser: 3.06 mg/dL — ABNORMAL HIGH (ref 0.61–1.24)
GFR, Estimated: 22 mL/min — ABNORMAL LOW (ref >60)
Glucose, Bld: 163 mg/dL — ABNORMAL HIGH (ref 70–99)
Potassium: 3.5 mmol/L (ref 3.5–5.1)
Sodium: 133 mmol/L — ABNORMAL LOW (ref 135–145)

## 2021-08-14 LAB — GLUCOSE, CAPILLARY
Glucose-Capillary: 193 mg/dL — ABNORMAL HIGH (ref 70–99)
Glucose-Capillary: 203 mg/dL — ABNORMAL HIGH (ref 70–99)
Glucose-Capillary: 100 mg/dL — ABNORMAL HIGH (ref 70–99)
Glucose-Capillary: 117 mg/dL — ABNORMAL HIGH (ref 70–99)

## 2021-08-14 MED ORDER — HEPARIN SODIUM (PORCINE) 1000 UNIT/ML DIALYSIS
1000.0000 [IU] | INTRAMUSCULAR | Status: DC | PRN
Start: 2021-08-15 — End: 2021-08-14

## 2021-08-14 NOTE — Progress Notes (Signed)
IP rehab admissions - I met with patient at the bedside.  Patient would like to DC home with [facility] therapies.  He did tell me to contact Delores.  I called Delores and she is in favor of rehab in the hospital or in a SNF because patient now has bilateral amputations and he has HD 3 times a week.  Delores will talk with patient's aunt and his sister and then will call me back with a decision about rehab.  I will update all once patient/family decide what rehab venue they would like to pursue.  Call me for questions.  [phone]

## 2021-08-14 NOTE — Progress Notes (Signed)
IP rehab admissions - Noted PT/OT recommending SNF placement.  I will talk with patient today to confirm discharge plan.  Call me for questions.  [phone]

## 2021-08-14 NOTE — Progress Notes (Signed)
OT Cancellation Note  Patient Details Name: Rain Friedt MRN: [id number] DOB: [birth year]   Cancelled Treatment:    Reason Eval/Treat Not Completed: Patient at procedure or test/ unavailable. Attempted to see patient for OT treatment. Pt unavailable and receiving dialysis. Will re-attempt treatment when able.   Ailene Ravel, OTR/L,CBIS  Supplemental OT - [facility] and D[facility] Dress  08/14/2021, 1:48 PM

## 2021-08-14 NOTE — TOC Initial Note (Addendum)
Transition of Care [facility]) - Initial/Assessment Note    Patient Details  Name: [name] MRN: [id number] Date of Birth: [birth year]  Transition of Care [facility]) CM/SW Contact:    Joanne Chars, LCSW Phone Number: 08/14/2021, 10:01 AM  Clinical Narrative:   CSW met with pt regarding DC recommendation for SNF.  Pt is not in favor of this and states he wants to go home.  Pt reports he lives with his aunt Arelia Sneddon who does assist with his needs, reports she is available 24/7.  Discussed that he needs significant assist currently and pt states his children are also involved, his sister [name] as well, and they can make this work.  Permission given to speak with all of the above.   CSW spoke with sister Tawanna Sat.  Pt does live with Clarina, she is CNA and does assist, however, she works 4 hours per day out of the home. They are trying to get funding for [facility] aide full time but not yet successful.  She will speak to pt about need for SNF.  Also requesting call from MD with some questions.  MD informed.  CSW noted CIR has note in, CSW spoke with Genie from CIR and pt is candidate, she will speak with him today as well for CIR potential.                1135: TC Mary.  Pt does need to go to facility, they will talk with him.  Discussed potential CIR and they are interested in pursuing this rather than SNF.  Expected Discharge Plan:  (TBD) Barriers to Discharge: Continued Medical Work up   Patient Goals and CMS Choice Patient states their goals for this hospitalization and ongoing recovery are:: "do for myself."      Expected Discharge Plan and Services Expected Discharge Plan:  (TBD) In-house Referral: Clinical Social Work   Post Acute Care Choice:  (TBD) Living arrangements for the past 2 months: Single Family Home                                      Prior Living Arrangements/Services Living arrangements for the past 2 months: Single Family Home Lives with:: Relatives (lives  with Earleen Reaper) Patient language and need for interpreter reviewed:: Yes Do you feel safe going back to the place where you live?: Yes      Need for Family Participation in Patient Care: Yes (Comment) Care giver support system in place?: Yes (comment) Current home services: Other (comment) (none) Criminal Activity/Legal Involvement Pertinent to Current Situation/Hospitalization: No - Comment as needed  Activities of Daily Living Home Assistive Devices/Equipment: Prosthesis ADL Screening (condition at time of admission) Patient's cognitive ability adequate to safely complete daily activities?: Yes Is the patient deaf or have difficulty hearing?: No Does the patient have difficulty seeing, even when wearing glasses/contacts?: No Does the patient have difficulty concentrating, remembering, or making decisions?: No Patient able to express need for assistance with ADLs?: Yes Does the patient have difficulty dressing or bathing?: No Independently performs ADLs?: Yes (appropriate for developmental age) Does the patient have difficulty walking or climbing stairs?: No Weakness of Legs: Both Weakness of Arms/Hands: None  Permission Sought/Granted Permission sought to share information with : Family Supports Permission granted to share information with : Yes, Verbal Permission Granted  Share Information with NAME: permission given to speak with all listed contacts  Emotional Assessment Appearance:: Appears stated age Attitude/Demeanor/Rapport: Engaged Affect (typically observed): Appropriate Orientation: : Oriented to Self, Oriented to Place, Oriented to  Time, Oriented to Situation Alcohol / Substance Use: Not Applicable Psych Involvement: No (comment)  Admission diagnosis:  Infection of amputation stump, left lower extremity (HCC) [T87.44] Gangrene of left foot ([place]) [I96] Patient Active Problem List   Diagnosis Date Noted   Atherosclerosis of native arteries of  extremities with gangrene, left leg (HCC)    Gangrene of left foot ([place])    Type 2 diabetes mellitus with diabetic peripheral angiopathy and gangrene, with long-term current use of insulin ([place])    Infection of amputation stump, left lower extremity ([place]) 08/10/2021   Diarrhea 07/21/2021   Type 2 diabetes mellitus with ESRD (end-stage renal disease) ([place]) 05/22/2021   Non-STEMI (non-ST elevated myocardial infarction) ([place]) 05/22/2021   Retroperitoneal hematoma    ESRD on dialysis ([place])    Colon polyps 03/03/2021   Mild cognitive impairment with memory loss 02/03/2021   Difficulty urinating 02/03/2021   Hx of medication noncompliance 09/30/2020   Rash 09/22/2020   S/P transmetatarsal amputation of foot, left ([place]) 11/23/2019   S/P BKA (below knee amputation) unilateral, right ([place]) 08/17/2018   Pulmonary nodules/lesions, multiple 02/16/2018   Diabetic neuropathy ([place]) 05/30/2014   GERD (gastroesophageal reflux disease) 76/73/4193   Diastolic dysfunction 79/02/4095   Healthcare maintenance 02/15/2013   PVD (peripheral vascular disease) ([place]) 12/31/2011   HLD (hyperlipidemia) 03/06/2008   Hypertension, essential 03/06/2008   PCP:  [name], MD Pharmacy:   [facility] [address] [place] [address] Phone: [phone] Fax: [place] [address] [place] [address] Phone: [phone] Fax: [place] [address] Phone: [phone] Fax: [phone]  [facility] [place] [place] [address] Phone: [phone] Fax: [phone]     Social Determinants of Health (SDOH) Interventions    Readmission Risk Interventions     No data to display

## 2021-08-14 NOTE — Progress Notes (Signed)
Subjective  -   Patient currently in dialysis Status post left above-knee amputation by Dr. Sharol Given  Physical Exam:  Stable bilateral finger ulcers, left greater than right   Bilateral upper extremity duplex: Right: Monophasic flow is noted in the distal radial artery,         suggesting a lesion in the mid segment.         Monophasic flow is noted in the mid and distal segments of         the ulnar artery, suggesting a lesion in the proximal         segment.  Left: Bidirectional flow is noted in the radial and ulnar arteries,        suggesting steal syndrome in the presence of an ulnar-basilic        HD access.     Assessment/Plan:    Bilateral early finger lesions: I discussed with the patient that he is going to need bilateral upper extremity angiography to define his arterial anatomy.  He does have a recently placed fistula in the left arm which I think is going to compromise his ability to heal his fingers and so he is also going to need ligation of his fistula.  I will try to get this done next week.  [name] 08/14/2021 6:01 PM --  Vitals:   08/14/21 1700 08/14/21 1730  BP: (!) 149/66 (!) 148/73  Pulse: 81 82  Resp: 18 17  Temp:  98.2 F (36.8 C)  SpO2: 96% 97%    Intake/Output Summary (Last 24 hours) at 08/14/2021 1801 Last data filed at 08/14/2021 1730 Gross per 24 hour  Intake 360 ml  Output 0 ml  Net 360 ml     Laboratory CBC    Component Value Date/Time   WBC 11.2 (H) 08/14/2021 0139   HGB 9.4 (L) 08/14/2021 0139   HGB 10.2 (L) 07/20/2021 1639   HCT 29.1 (L) 08/14/2021 0139   HCT 31.0 (L) 07/20/2021 1639   PLT 258 08/14/2021 0139   PLT 264 07/20/2021 1639    BMET    Component Value Date/Time   NA 133 (L) 08/14/2021 0139   NA 136 03/02/2021 1630   K 3.5 08/14/2021 0139   CL 97 (L) 08/14/2021 0139   CO2 25 08/14/2021 0139   GLUCOSE 163 (H) 08/14/2021 0139   BUN 30 (H) 08/14/2021 0139   BUN 33 (H) 03/02/2021 1630   CREATININE  3.06 (H) 08/14/2021 0139   CREATININE 0.85 05/30/2013 1100   CALCIUM 7.9 (L) 08/14/2021 0139   GFRNONAA 22 (L) 08/14/2021 0139   GFRNONAA 70 04/11/2013 0825   GFRAA 33 (L) 08/01/2019 1510   GFRAA 81 04/11/2013 0825    COAG Lab Results  Component Value Date   INR 1.4 (H) 08/11/2021   INR 1.2 05/13/2021   INR 1.3 (H) 05/12/2021   No results found for: "PTT"  Antibiotics Anti-infectives (From admission, onward)    Start     Dose/Rate Route Frequency Ordered Stop   08/12/21 1845  ceFAZolin (ANCEF) IVPB 2g/100 mL premix  Status:  Discontinued        2 g 200 mL/hr over 30 Minutes Intravenous Every 8 hours 08/12/21 1832 08/12/21 1850   08/12/21 0945  ceFAZolin (ANCEF) IVPB 2g/100 mL premix        2 g 200 mL/hr over 30 Minutes Intravenous On call to O.R. 08/12/21 0856 08/12/21 1657   08/11/21 1700  vancomycin (VANCOREADY) IVPB 750 mg/150 mL  Status:  Discontinued        750 mg 150 mL/hr over 60 Minutes Intravenous Every T-Th-Sa (Hemodialysis) 08/11/21 1619 08/11/21 1623   08/11/21 1630  vancomycin (VANCOREADY) IVPB 750 mg/150 mL        750 mg 150 mL/hr over 60 Minutes Intravenous Every Tue (Hemodialysis) 08/11/21 1623 08/11/21 1921   08/11/21 1600  ceFEPIme (MAXIPIME) 1 g in sodium chloride 0.9 % 100 mL IVPB  Status:  Discontinued        1 g 200 mL/hr over 30 Minutes Intravenous Every 24 hours 08/10/21 1136 08/12/21 1856   08/10/21 1145  vancomycin (VANCOREADY) IVPB 1500 mg/300 mL        1,500 mg 150 mL/hr over 120 Minutes Intravenous  Once 08/10/21 1136 08/10/21 1723   08/10/21 1145  ceFEPIme (MAXIPIME) 1 g in sodium chloride 0.9 % 100 mL IVPB        1 g 200 mL/hr over 30 Minutes Intravenous  Once 08/10/21 1136 08/10/21 1514   08/10/21 1135  vancomycin variable dose per unstable renal function (pharmacist dosing)  Status:  Discontinued         Does not apply See admin instructions 08/10/21 1136 08/12/21 1856   08/10/21 1100  clindamycin (CLEOCIN) capsule 600 mg        600 mg  Oral  Once 08/10/21 1057 08/10/21 1528        V. Leia Alf, M.D., [facility] [place] Office: [phone] Pager:  [phone]
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
## 2021-08-14 NOTE — Progress Notes (Signed)
[facility] Progress Note   Subjective:   Seen in room, reports pain in his hands and L stump for several days. Aching in nature. Denies SOB, CP, dizziness, abdominal pain and nausea.   Objective Vitals:   08/13/21 1457 08/13/21 2200 08/14/21 0602 08/14/21 0820  BP: (!) 151/76 (!) 144/77 (!) 151/67 (!) 157/73  Pulse: 64 87 86 85  Resp: '13  16 14  '$ Temp: 97.6 F (36.4 C) 98.4 F (36.9 C) 98.7 F (37.1 C) 98 F (36.7 C)  TempSrc: Oral Oral Oral Oral  SpO2: 98% 98% 100% 100%  Weight:      Height:       Physical Exam General: Alert male in NAD Heart: RRR, no murmurs, rubs or gallops Lungs: CTA bilaterally without wheezing, rhonchi or rales Abdomen: Soft, non-tender, non-distended, +BS Extremities: L AKA with wound vac, no significant stump edema bilaterally Dialysis Access: TDC, maturing L AVF + bruit  Additional Objective Labs: Basic Metabolic Panel: Recent Labs  Lab 08/11/21 0757 08/12/21 1231 08/13/21 0719 08/14/21 0139  NA 130* 133* 135 133*  K 3.2* 3.2* 3.5 3.5  CL 93* 94* 96* 97*  CO2 21*  --  28 25  GLUCOSE 186* 127* 127* 163*  BUN 30* 19 11 30*  CREATININE 4.64* 3.10* 2.17* 3.06*  CALCIUM 8.0*  --  8.0* 7.9*  PHOS 3.7  --   --   --    Liver Function Tests: Recent Labs  Lab 08/10/21 1416 08/11/21 0757  AST 14*  --   ALT 12  --   ALKPHOS 87  --   BILITOT 1.5*  --   PROT 8.1  --   ALBUMIN 2.9* 2.3*   No results for input(s): "LIPASE", "AMYLASE" in the last 168 hours. CBC: Recent Labs  Lab 08/10/21 0302 08/11/21 0757 08/12/21 0210 08/12/21 1231 08/13/21 0719 08/14/21 0139  WBC 21.2* 23.2* 12.2*  --  11.0* 11.2*  NEUTROABS  --   --  10.5*  --   --   --   HGB 10.6* 7.7* 7.9* 7.8* 9.2* 9.4*  HCT 33.2* 22.7* 24.0* 23.0* 28.7* 29.1*  MCV 88.8 85.7 86.3  --  87.8 87.4  PLT 224 293 217  --  245 258   Blood Culture    Component Value Date/Time   SDES BLOOD SITE NOT SPECIFIED 08/10/2021 1416   SDES BLOOD SITE NOT SPECIFIED  08/10/2021 1416   SPECREQUEST  08/10/2021 1416    BOTTLES DRAWN AEROBIC ONLY Blood Culture adequate volume   SPECREQUEST  08/10/2021 1416    BOTTLES DRAWN AEROBIC ONLY Blood Culture adequate volume   CULT  08/10/2021 1416    NO GROWTH 4 DAYS Performed at [facility], [place] [address] [place], [place] [address]    CULT  08/10/2021 1416    NO GROWTH 4 DAYS Performed at [facility], [place] [address], [place], [place] [address]    REPTSTATUS PENDING 08/10/2021 1416   REPTSTATUS PENDING 08/10/2021 1416    Cardiac Enzymes: No results for input(s): "CKTOTAL", "CKMB", "CKMBINDEX", "TROPONINI" in the last 168 hours. CBG: Recent Labs  Lab 08/13/21 0901 08/13/21 1154 08/13/21 1616 08/13/21 2225 08/14/21 0734  GLUCAP 111* 142* 146* 147* 193*   Iron Studies:  Recent Labs    08/12/21 0210  IRON 38*  TIBC 144*  FERRITIN 414*   '@lablastinr3'$ @ Studies/Results: VAS [place] UPPER EXTREMITY ARTERIAL DUPLEX  Result Date: 08/13/2021  UPPER EXTREMITY DUPLEX STUDY Patient Name:  [facility]  Date of Exam:   08/13/2021 Medical Rec #: [id number]    Accession #:    [id number] Date of Birth: 1960/04/12    Patient Gender: M Patient Age:   61 years Exam Location:  [facility] Procedure:      VAS [place] UPPER EXTREMITY ARTERIAL DUPLEX Referring Phys: Harold Barban --------------------------------------------------------------------------------  Indications: Ulceration. History:     Patient has a history of 07/02/2021 - LEFT ARM ARTERIOVENOUS (AV)              Left ulnar-basilic FISTULA CREATION.  Risk Factors: Hypertension, Diabetes. Limitations: poor patient cooperation, patient constant movement, patient              positioning, IV's/bandages, R central line Comparison Study: No prior studies. Performing Technologist: Oliver Hum RVT  Examination Guidelines: A complete evaluation includes B-mode imaging, spectral Doppler, color Doppler, and power Doppler as needed of all accessible portions of  each vessel. Bilateral testing is considered an integral part of a complete examination. Limited examinations for reoccurring indications may be performed as noted.  Right Doppler Findings: +---------------+----------+----------+--------+------------+ Site           PSV (cm/s)Waveform  StenosisComments     +---------------+----------+----------+--------+------------+ Subclavian Prox                            Central line +---------------+----------+----------+--------+------------+ Subclavian Mid 93        biphasic                       +---------------+----------+----------+--------+------------+ Subclavian Dist          monophasic                     +---------------+----------+----------+--------+------------+ Brachial Prox  109       biphasic                       +---------------+----------+----------+--------+------------+ Brachial Dist  134       biphasic                       +---------------+----------+----------+--------+------------+ Radial Prox    63        biphasic                       +---------------+----------+----------+--------+------------+ Radial Mid     41        biphasic                       +---------------+----------+----------+--------+------------+ Radial Dist    31        monophasic                     +---------------+----------+----------+--------+------------+ Ulnar Prox     89        biphasic                       +---------------+----------+----------+--------+------------+ Ulnar Mid      17        monophasic                     +---------------+----------+----------+--------+------------+ Ulnar Dist     7         monophasic                     +---------------+----------+----------+--------+------------+ Incidental finding of right  forearm cephalic vein thrombus.   Left Doppler Findings: +---------------+----------+----------+--------+------------------+ Site           PSV (cm/s)Waveform  StenosisComments            +---------------+----------+----------+--------+------------------+ Subclavian Prox195                                            +---------------+----------+----------+--------+------------------+ Subclavian Mid 139                                            +---------------+----------+----------+--------+------------------+ Brachial Prox  289                                            +---------------+----------+----------+--------+------------------+ Brachial Dist  303                                            +---------------+----------+----------+--------+------------------+ Radial Prox    76        biphasic                             +---------------+----------+----------+--------+------------------+ Radial Mid     42        biphasic                             +---------------+----------+----------+--------+------------------+ Radial Dist              Absent                               +---------------+----------+----------+--------+------------------+ Ulnar Prox     42        biphasic          Bidirectional flow +---------------+----------+----------+--------+------------------+ Ulnar Mid      37        biphasic          Bidirectional flow +---------------+----------+----------+--------+------------------+ Ulnar Dist     30        monophasic        Bidirectional flow +---------------+----------+----------+--------+------------------+ Left ulnar-basilic arteriovenous fistula  Summary:  Right: Monophasic flow is noted in the distal radial artery,        suggesting a lesion in the mid segment.        Monophasic flow is noted in the mid and distal segments of        the ulnar artery, suggesting a lesion in the proximal        segment. Left: Bidirectional flow is noted in the radial and ulnar arteries,       suggesting steal syndrome in the presence of an ulnar-basilic       HD access. *See table(s) above for measurements and observations.     Preliminary    ECHOCARDIOGRAM COMPLETE  Result Date: 08/12/2021    ECHOCARDIOGRAM REPORT   Patient Name:   [facility] Date of Exam: 08/12/2021 Medical Rec #:  [id number]   Height:       72.0  in Accession #:    [id number]  Weight:       156.1 lb Date of Birth:  [birth year]   BSA:          1.918 m Patient Age:    80 years    BP:           130/72 mmHg Patient Gender: M           HR:           89 bpm. Exam Location:  Inpatient Procedure: 2D Echo, Color Doppler and Cardiac Doppler Indications:    Lesion of finger  History:        Patient has prior history of Echocardiogram examinations, most                 recent 05/13/2021. HFreF, PAD, Signs/Symptoms:Shortness of                 Breath; Risk Factors:Diabetes, Hypertension and Dyslipidemia.                 ESRD.  Sonographer:    Eartha Inch Referring Phys: [id number] MATTHEW EVELAND  Sonographer Comments: Image acquisition challenging due to uncooperative patient. Echo performed prior to surgery. IMPRESSIONS  1. Left ventricular ejection fraction, by estimation, is 30 to 35%. The left ventricle has moderately decreased function. The left ventricle demonstrates global hypokinesis. Left ventricular diastolic parameters are consistent with Grade II diastolic dysfunction (pseudonormalization). There is the interventricular septum is flattened in systole, consistent with right ventricular pressure overload.  2. Right ventricular systolic function is moderately reduced. The right ventricular size is mildly enlarged. There is normal pulmonary artery systolic pressure.  3. Left atrial size was mildly dilated.  4. The mitral valve is normal in structure. Mild to moderate mitral valve regurgitation. No evidence of mitral stenosis.  5. Tricuspid valve regurgitation is moderate.  6. The aortic valve is normal in structure. Aortic valve regurgitation is not visualized. No aortic stenosis is present.  7. The inferior vena cava is dilated in size with >50% respiratory variability,  suggesting right atrial pressure of 8 mmHg. Comparison(s): A prior study was performed on 05/13/2021. No significant change from prior study. FINDINGS  Left Ventricle: Left ventricular ejection fraction, by estimation, is 30 to 35%. The left ventricle has moderately decreased function. The left ventricle demonstrates global hypokinesis. The left ventricular internal cavity size was normal in size. There is no left ventricular hypertrophy. The interventricular septum is flattened in systole, consistent with right ventricular pressure overload. Left ventricular diastolic parameters are consistent with Grade II diastolic dysfunction (pseudonormalization). Right Ventricle: The right ventricular size is mildly enlarged. No increase in right ventricular wall thickness. Right ventricular systolic function is moderately reduced. There is normal pulmonary artery systolic pressure. The tricuspid regurgitant velocity is 2.17 m/s, and with an assumed right atrial pressure of 8 mmHg, the estimated right ventricular systolic pressure is 44.0 mmHg. Left Atrium: Left atrial size was mildly dilated. Right Atrium: Right atrial size was normal in size. Pericardium: There is no evidence of pericardial effusion. Mitral Valve: The mitral valve is normal in structure. Mild to moderate mitral valve regurgitation. No evidence of mitral valve stenosis. Tricuspid Valve: The tricuspid valve is normal in structure. Tricuspid valve regurgitation is moderate . No evidence of tricuspid stenosis. Aortic Valve: The aortic valve is normal in structure. Aortic valve regurgitation is not visualized. No aortic stenosis is present. Pulmonic Valve: The pulmonic valve was not well visualized. Pulmonic valve regurgitation is trivial.  No evidence of pulmonic stenosis. Aorta: The aortic root is normal in size and structure. Venous: The inferior vena cava is dilated in size with greater than 50% respiratory variability, suggesting right atrial pressure of 8  mmHg. IAS/Shunts: No atrial level shunt detected by color flow Doppler.  LEFT VENTRICLE PLAX 2D LVIDd:         5.00 cm      Diastology LVIDs:         4.10 cm      LV e' medial:    3.43 cm/s LV PW:         1.00 cm      LV E/e' medial:  4.8 LV IVS:        0.80 cm      LV e' lateral:   7.37 cm/s LVOT diam:     1.90 cm      LV E/e' lateral: 2.2 LV SV:         52 LV SV Index:   27 LVOT Area:     2.84 cm  LV Volumes (MOD) LV vol d, MOD A2C: 90.2 ml LV vol d, MOD A4C: 108.0 ml LV vol s, MOD A2C: 60.7 ml LV vol s, MOD A4C: 70.5 ml LV SV MOD A2C:     29.5 ml LV SV MOD A4C:     108.0 ml LV SV MOD BP:      34.1 ml RIGHT VENTRICLE            IVC RV S prime:     7.99 cm/s  IVC diam: 2.30 cm TAPSE (M-mode): 1.4 cm LEFT ATRIUM             Index        RIGHT ATRIUM           Index LA diam:        4.20 cm 2.19 cm/m   RA Area:     17.40 cm LA Vol (A2C):   76.8 ml 40.05 ml/m  RA Volume:   51.60 ml  26.91 ml/m LA Vol (A4C):   82.3 ml 42.92 ml/m LA Biplane Vol: 79.1 ml 41.25 ml/m  AORTIC VALVE LVOT Vmax:   101.00 cm/s LVOT Vmean:  68.200 cm/s LVOT VTI:    0.184 m  AORTA Ao Root diam: 2.90 cm MITRAL VALVE               TRICUSPID VALVE MV Area (PHT): 5.97 cm    TR Peak grad:   18.8 mmHg MV Decel Time: 127 msec    TR Vmax:        217.00 cm/s MR Peak grad: 68.2 mmHg MR Vmax:      413.00 cm/s  SHUNTS MV E velocity: 16.30 cm/s  Systemic VTI:  0.18 m MV A velocity: 47.60 cm/s  Systemic Diam: 1.90 cm MV E/A ratio:  0.34 Kardie Tobb DO Electronically signed by Berniece Salines DO Signature Date/Time: 08/12/2021/2:57:47 PM    Final    Medications:  sodium chloride Stopped (08/13/21 0009)   anticoagulant sodium citrate     magnesium sulfate bolus IVPB      (feeding supplement) PROSource Plus  30 mL Oral BID BM   vitamin C  1,000 mg Oral Daily   Chlorhexidine Gluconate Cloth  6 each Topical Q0600   darbepoetin (ARANESP) injection - DIALYSIS  40 mcg Intravenous Q Wed-HD   docusate sodium  100 mg Oral Daily   heparin injection  (subcutaneous)  5,000 Units Subcutaneous Q8H  insulin aspart  0-5 Units Subcutaneous QHS   insulin aspart  0-6 Units Subcutaneous TID WC   melatonin  5 mg Oral QHS   mupirocin ointment  1 Application Nasal BID   nutrition supplement (JUVEN)  1 packet Oral BID BM   pantoprazole  40 mg Oral Daily   zinc sulfate  220 mg Oral Daily    Dialysis Orders: [facility] MWF Time: 4:00  EDW: 72 kg  Flows: 400/700  [place]: 3K/2.5 Ca  Access: TDC ; L AVF (placed 07/02/21)  Heparin: No bolus heparin ESA: None  VDRA: Hectorol 3 TIW   Assessment/Plan: Left limb gangrene/ischemia.  Vascular/Ortho consulted. 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 Per Dr. Sharol Given.  ESRD -  HD MWF. Not adhearent to Rx as outpatient. Back on schedule. Next HD today.  Hand ischemia - VVS following. Pending arterial duplex  Hypertension/volume  - BP has been labile here.  Holding BP meds now.  Is at dry weight. No volume excess. Minimal UF with HD.  Anemia  - Hgb 9.4 s/p prbcs. Aranesp 40 given 8/10. Tsat 81%  Metabolic bone disease -  Corr Calcium/phos in range. Continue home meds. DMT2 -insulin per primary GERD - on protonix  Nutrition - Renal diet, add prot supps Dispo - Possibly SNF/CIR  Anice Paganini, PA-C 08/14/2021, 10:11 AM  [facility] Pager: [phone]

## 2021-08-14 NOTE — Progress Notes (Signed)
PT Cancellation Note  Patient Details Name: [name] MRN: [id number] DOB: 11/10/1960   Cancelled Treatment:    Reason Eval/Treat Not Completed: Other (comment).  Attempted co-visit, but gone to HD.  Retry as time and pt allow.   Ramond Dial 08/14/2021, 1:58 PM  Mee Hives, PT PhD Acute Rehab Dept. Number: [place] [facility] [place]

## 2021-08-14 NOTE — Progress Notes (Signed)
Received patient in bed to unit.  Alert and oriented.  Informed consent signed and in  chart.   Treatment initiated: 1330 Treatment completed: 1730  Patient tolerated well.  Transported back to the room  alert, without acute distress.  Hand-off given to patient's nurse.   Access used: HD cath Access issues: none  Total UF removed: 0 Medication(s) given: heparin dwells 3200 units Post HD VS: 98.2    148/73    82    17    97% RA Post HD weight: 66.3kg   [facility]

## 2021-08-15 DIAGNOSIS — T8744 Infection of amputation stump, left lower extremity: Secondary | ICD-10-CM | POA: Diagnosis not present

## 2021-08-15 DIAGNOSIS — N186 End stage renal disease: Secondary | ICD-10-CM | POA: Diagnosis not present

## 2021-08-15 DIAGNOSIS — I739 Peripheral vascular disease, unspecified: Secondary | ICD-10-CM | POA: Diagnosis not present

## 2021-08-15 DIAGNOSIS — I96 Gangrene, not elsewhere classified: Secondary | ICD-10-CM | POA: Diagnosis not present

## 2021-08-15 LAB — CULTURE, BLOOD (ROUTINE X 2)
Culture: NO GROWTH
Culture: NO GROWTH
Special Requests: ADEQUATE
Special Requests: ADEQUATE

## 2021-08-15 LAB — GLUCOSE, CAPILLARY
Glucose-Capillary: 151 mg/dL — ABNORMAL HIGH (ref 70–99)
Glucose-Capillary: 166 mg/dL — ABNORMAL HIGH (ref 70–99)
Glucose-Capillary: 148 mg/dL — ABNORMAL HIGH (ref 70–99)
Glucose-Capillary: 172 mg/dL — ABNORMAL HIGH (ref 70–99)

## 2021-08-15 MED ORDER — AMLODIPINE BESYLATE 5 MG PO TABS
5.0000 mg | ORAL_TABLET | Freq: Every day | ORAL | Status: DC
  Administered 2021-08-15 – 2021-08-20 (×5): 5 mg via ORAL
  Filled 2021-08-15 (×6): qty 1

## 2021-08-15 NOTE — Progress Notes (Signed)
[facility] Progress Note   Subjective:   Reports no issues with HD yesterday. Continues to complain of pain and discoloration in his finders, noted Dr. Trula Slade is planning surgical intervention this upcoming well. Pt denies SOB, CP, dizziness and nausea.   Objective Vitals:   08/14/21 2115 08/15/21 0411 08/15/21 0622 08/15/21 0852  BP: (!) 148/66 (!) 144/89 (!) 147/70 (!) 149/71  Pulse: 84 83 83 82  Resp: '18 18 18 18  '$ Temp: 98.3 F (36.8 C) 98.2 F (36.8 C) 98 F (36.7 C) 98.4 F (36.9 C)  TempSrc: Oral Oral Oral   SpO2: 98% 98% 96% 100%  Weight:      Height:       Physical Exam General: Alert male in NAD Heart: RRR, no murmurs, rubs or gallops Lungs: CTA bilaterally without wheezing, rhonchi or rales Abdomen: Soft, non-distended, +BS Extremities: L AKA with wound vac, no stump edema Dialysis Access:  TDC, LUE AVF + bruit  Additional Objective Labs: Basic Metabolic Panel: Recent Labs  Lab 08/11/21 0757 08/12/21 1231 08/13/21 0719 08/14/21 0139  NA 130* 133* 135 133*  K 3.2* 3.2* 3.5 3.5  CL 93* 94* 96* 97*  CO2 21*  --  28 25  GLUCOSE 186* 127* 127* 163*  BUN 30* 19 11 30*  CREATININE 4.64* 3.10* 2.17* 3.06*  CALCIUM 8.0*  --  8.0* 7.9*  PHOS 3.7  --   --   --    Liver Function Tests: Recent Labs  Lab 08/10/21 1416 08/11/21 0757  AST 14*  --   ALT 12  --   ALKPHOS 87  --   BILITOT 1.5*  --   PROT 8.1  --   ALBUMIN 2.9* 2.3*   No results for input(s): "LIPASE", "AMYLASE" in the last 168 hours. CBC: Recent Labs  Lab 08/10/21 0302 08/11/21 0757 08/12/21 0210 08/12/21 1231 08/13/21 0719 08/14/21 0139  WBC 21.2* 23.2* 12.2*  --  11.0* 11.2*  NEUTROABS  --   --  10.5*  --   --   --   HGB 10.6* 7.7* 7.9* 7.8* 9.2* 9.4*  HCT 33.2* 22.7* 24.0* 23.0* 28.7* 29.1*  MCV 88.8 85.7 86.3  --  87.8 87.4  PLT 224 293 217  --  245 258   Blood Culture    Component Value Date/Time   SDES BLOOD SITE NOT SPECIFIED 08/10/2021 1416   SDES  BLOOD SITE NOT SPECIFIED 08/10/2021 1416   SPECREQUEST  08/10/2021 1416    BOTTLES DRAWN AEROBIC ONLY Blood Culture adequate volume   SPECREQUEST  08/10/2021 1416    BOTTLES DRAWN AEROBIC ONLY Blood Culture adequate volume   CULT  08/10/2021 1416    NO GROWTH 5 DAYS Performed at [facility], [place] [address] [place], [place] [address]    CULT  08/10/2021 1416    NO GROWTH 5 DAYS Performed at [facility], [place] [address] [place], [place] [address]    REPTSTATUS 08/15/2021 FINAL 08/10/2021 1416   REPTSTATUS 08/15/2021 FINAL 08/10/2021 1416    Cardiac Enzymes: No results for input(s): "CKTOTAL", "CKMB", "CKMBINDEX", "TROPONINI" in the last 168 hours. CBG: Recent Labs  Lab 08/14/21 0734 08/14/21 1133 08/14/21 1842 08/14/21 2028 08/15/21 0848  GLUCAP 193* 203* 100* 117* 151*   Iron Studies: No results for input(s): "IRON", "TIBC", "TRANSFERRIN", "FERRITIN" in the last 72 hours. '@lablastinr3'$ @ Studies/Results: VAS [place] UPPER EXTREMITY ARTERIAL DUPLEX  Result Date: 08/14/2021  UPPER EXTREMITY DUPLEX STUDY Patient Name:  [facility]  Date of Exam:   08/13/2021 Medical Rec #: [id number]    Accession #:    [id number] Date of Birth: 02/12/1960    Patient Gender: M Patient Age:   61 years Exam Location:  [facility] Procedure:      VAS [place] UPPER EXTREMITY ARTERIAL DUPLEX Referring Phys: Harold Barban --------------------------------------------------------------------------------  Indications: Ulceration. History:     Patient has a history of 07/02/2021 - LEFT ARM ARTERIOVENOUS (AV)              Left ulnar-basilic FISTULA CREATION.  Risk Factors: Hypertension, Diabetes. Limitations: poor patient cooperation, patient constant movement, patient              positioning, IV's/bandages, R central line Comparison Study: No prior studies. Performing Technologist: Oliver Hum RVT  Examination Guidelines: A complete evaluation includes B-mode imaging, spectral Doppler, color Doppler,  and power Doppler as needed of all accessible portions of each vessel. Bilateral testing is considered an integral part of a complete examination. Limited examinations for reoccurring indications may be performed as noted.  Right Doppler Findings: +---------------+----------+----------+--------+------------+ Site           PSV (cm/s)Waveform  StenosisComments     +---------------+----------+----------+--------+------------+ Subclavian Prox                            Central line +---------------+----------+----------+--------+------------+ Subclavian Mid 93        biphasic                       +---------------+----------+----------+--------+------------+ Subclavian Dist          monophasic                     +---------------+----------+----------+--------+------------+ Brachial Prox  109       biphasic                       +---------------+----------+----------+--------+------------+ Brachial Dist  134       biphasic                       +---------------+----------+----------+--------+------------+ Radial Prox    63        biphasic                       +---------------+----------+----------+--------+------------+ Radial Mid     41        biphasic                       +---------------+----------+----------+--------+------------+ Radial Dist    31        monophasic                     +---------------+----------+----------+--------+------------+ Ulnar Prox     89        biphasic                       +---------------+----------+----------+--------+------------+ Ulnar Mid      17        monophasic                     +---------------+----------+----------+--------+------------+ Ulnar Dist     7         monophasic                     +---------------+----------+----------+--------+------------+ Incidental finding of right  forearm cephalic vein thrombus.   Left Doppler Findings:  +---------------+----------+----------+--------+------------------+ Site           PSV (cm/s)Waveform  StenosisComments           +---------------+----------+----------+--------+------------------+ Subclavian Prox195                                            +---------------+----------+----------+--------+------------------+ Subclavian Mid 139                                            +---------------+----------+----------+--------+------------------+ Brachial Prox  289                                            +---------------+----------+----------+--------+------------------+ Brachial Dist  303                                            +---------------+----------+----------+--------+------------------+ Radial Prox    76        biphasic                             +---------------+----------+----------+--------+------------------+ Radial Mid     42        biphasic                             +---------------+----------+----------+--------+------------------+ Radial Dist              Absent                               +---------------+----------+----------+--------+------------------+ Ulnar Prox     42        biphasic          Bidirectional flow +---------------+----------+----------+--------+------------------+ Ulnar Mid      37        biphasic          Bidirectional flow +---------------+----------+----------+--------+------------------+ Ulnar Dist     30        monophasic        Bidirectional flow +---------------+----------+----------+--------+------------------+ Left ulnar-basilic arteriovenous fistula  Summary:  Right: Monophasic flow is noted in the distal radial artery,        suggesting a lesion in the mid segment.        Monophasic flow is noted in the mid and distal segments of        the ulnar artery, suggesting a lesion in the proximal        segment. Left: Bidirectional flow is noted in the radial and ulnar arteries,        suggesting steal syndrome in the presence of an ulnar-basilic       HD access. *See table(s) above for measurements and observations. Electronically signed by Orlie Pollen on 08/14/2021 at 5:42:27 PM.    Final    Medications:  sodium chloride Stopped (08/13/21 0009)   anticoagulant sodium citrate     magnesium sulfate bolus IVPB      (feeding supplement) PROSource  Plus  30 mL Oral BID BM   vitamin C  1,000 mg Oral Daily   Chlorhexidine Gluconate Cloth  6 each Topical Q0600   darbepoetin (ARANESP) injection - DIALYSIS  40 mcg Intravenous Q Wed-HD   docusate sodium  100 mg Oral Daily   heparin injection (subcutaneous)  5,000 Units Subcutaneous Q8H   insulin aspart  0-5 Units Subcutaneous QHS   insulin aspart  0-6 Units Subcutaneous TID WC   melatonin  5 mg Oral QHS   mupirocin ointment  1 Application Nasal BID   nutrition supplement (JUVEN)  1 packet Oral BID BM   pantoprazole  40 mg Oral Daily   zinc sulfate  220 mg Oral Daily    Dialysis Orders: [facility] MWF Time: 4:00  EDW: 72 kg  Flows: 400/700  [place]: 3K/2.5 Ca  Access: TDC ; L AVF (placed 07/02/21)  Heparin: No bolus heparin ESA: None  VDRA: Hectorol 3 TIW   Assessment/Plan: Left limb gangrene/ischemia.  Vascular/Ortho consulted. 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 Per Dr. Sharol Given.  ESRD -  HD MWF. Not adhearent to Rx as outpatient. Back on schedule. Next HD Monday  Hand ischemia - VVS following. Planning arteriogram and fistula ligation.  Hypertension/volume  - BP has been variable, moderately elevated today.  Below his EDW (has likely lost some weight) with no volume excess on exam. Was on amlodipine '10mg'$  daily at home, will resume at lower dose of '5mg'$  daly for now.  Anemia  - Hgb 9.4 s/p prbcs. Aranesp 40 given 8/10. Tsat 99%  Metabolic bone disease -  Corr Calcium/phos in range. Continue home meds. DMT2 -insulin per primary GERD - on protonix  Nutrition - Renal diet, add prot supps Dispo - Possibly SNF/CIR  Anice Paganini, PA-C 08/15/2021,  8:59 AM  [facility] Pager: [phone]

## 2021-08-15 NOTE — Progress Notes (Signed)
Patient ID: [name], male   DOB: 14-Aug-1960, 61 y.o.   MRN: [id number]   Subjective: 3 Days Post-Op Procedure(s) (LRB): LEFT ABOVE KNEE AMPUTATION (Left) Patient reports pain as 0 on 0-10 scale.    Objective: Vital signs in last 24 hours: Temp:  [98 F (36.7 C)-98.4 F (36.9 C)] 98.4 F (36.9 C) (08/12 0852) Pulse Rate:  [78-84] 82 (08/12 0852) Resp:  [10-18] 18 (08/12 0852) BP: (138-151)/(63-89) 149/71 (08/12 0852) SpO2:  [92 %-100 %] 100 % (08/12 0852) Weight:  [66.3 kg-67.4 kg] 66.3 kg (08/11 1736)  Intake/Output from previous day: 08/11 0701 - 08/12 0700 In: 360 [P.O.:360] Out: 300 [Urine:300] Intake/Output this shift: No intake/output data recorded.  Recent Labs    08/12/21 1231 08/13/21 0719 08/14/21 0139  HGB 7.8* 9.2* 9.4*   Recent Labs    08/13/21 0719 08/14/21 0139  WBC 11.0* 11.2*  RBC 3.27* 3.33*  HCT 28.7* 29.1*  PLT 245 258   Recent Labs    08/13/21 0719 08/14/21 0139  NA 135 133*  K 3.5 3.5  CL 96* 97*  CO2 28 25  BUN 11 30*  CREATININE 2.17* 3.06*  GLUCOSE 127* 163*  CALCIUM 8.0* 7.9*   No results for input(s): "LABPT", "INR" in the last 72 hours.  VAC , no drainage, cannister dry.  No results found.  Assessment/Plan: 3 Days Post-Op Procedure(s) (LRB): LEFT ABOVE KNEE AMPUTATION (Left) VAC intil 8/16 per Dr. Sharol Given op note. SNF .   Marybelle Killings 08/15/2021, 10:26 AM

## 2021-08-15 NOTE — Progress Notes (Signed)
Physical Therapy Treatment Patient Details Name: [name] MRN: [id number] DOB: 1960-03-24 Today's Date: 08/15/2021   History of Present Illness Pt is a 61 yr old male who presented 08/10/21 due to weakness. Pt found to have dry gangrene of LLE. Pt s/p L AKA on 08/12/21. 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 Assisted with rolling to side so pt could urinate in urinal. After rolling pt instructed to perform LE exercises, however pt would perform 1 rep and then used multiple avoidence strategies to delay additional exercise. Eventually pt would not even perform 1 rep and just laid there with out response. Pt reports he feel he will just spend the rest of his life sitting or laying in a bed. He states "I just want to be normal, but I don't know what normal is". Took time to encourage pt that he can still have a fulfilling life with the use of prosthesis and educated on the benefits of PT to maintain LE function while waiting for a prosthesis. Will check back next week. Hopefully pt's disposition will have improved.    Recommendations for follow up therapy are one component of a multi-disciplinary discharge planning process, led by the attending physician.  Recommendations may be updated based on patient status, additional functional criteria and insurance authorization.  Follow Up Recommendations  Skilled nursing-short term rehab (<3 hours/day) Can patient physically be transported by private vehicle: No   Assistance Recommended at Discharge Frequent or constant Supervision/Assistance  Patient can return home with the following Two people to help with walking and/or transfers;Two people to help with bathing/dressing/bathroom;Direct supervision/assist for medications management;Direct supervision/assist for financial management;Assist for transportation;Help with stairs or ramp for entrance    Equipment Recommendations  None recommended by PT    Recommendations for Other Services       Precautions / Restrictions Precautions Precautions: Fall Precaution Comments: confused Restrictions Weight Bearing Restrictions: No LLE Weight Bearing: Non weight bearing Other Position/Activity Restrictions: wound vac     Mobility  Bed Mobility Overal bed mobility: Needs Assistance Bed Mobility: Rolling Rolling: Mod assist, Max assist         General bed mobility comments: mod to roll and max to maintain sidelaying position for toileting.    Transfers                   General transfer comment: pt declined    Ambulation/Gait               General Gait Details: unable   Stairs             Wheelchair Mobility    Modified Rankin (Stroke Patients Only)       Balance       Sitting balance - Comments: declined       Standing balance comment: pt declined to try to stand                            Cognition Arousal/Alertness: Awake/alert Behavior During Therapy: Agitated Overall Cognitive Status: No family/caregiver present to determine baseline cognitive functioning                                 General Comments: Pt following 1 step commands inconsistantly, though likely  more due to avoidence than actual cognitive issue. Depressed today and feels like he will be stuck in a bed or chair for the rest of his life.        Exercises      General Comments General comments (skin integrity, edema, etc.): Pt assisted with toileting.      Pertinent Vitals/Pain Pain Assessment Pain Assessment: Faces Faces Pain Scale: Hurts little more Pain Location: LLE Pain Descriptors / Indicators: Guarding, Grimacing, Aching Pain Intervention(s): Monitored during session, Limited activity within patient's tolerance    Home Living                          Prior Function            PT Goals (current goals can  now be found in the care plan section) Acute Rehab PT Goals Patient Stated Goal: to manage his LLE PT Goal Formulation: Patient unable to participate in goal setting Time For Goal Achievement: 08/27/21 Potential to Achieve Goals: Fair Progress towards PT goals: Progressing toward goals (slowly)    Frequency    Min 3X/week      PT Plan Current plan remains appropriate    Co-evaluation              AM-PAC PT "6 Clicks" Mobility   Outcome Measure  Help needed turning from your back to your side while in a flat bed without using bedrails?: Total Help needed moving from lying on your back to sitting on the side of a flat bed without using bedrails?: Total Help needed moving to and from a bed to a chair (including a wheelchair)?: Total Help needed standing up from a chair using your arms (e.g., wheelchair or bedside chair)?: Total Help needed to walk in hospital room?: Total Help needed climbing 3-5 steps with a railing? : Total 6 Click Score: 6    End of Session Equipment Utilized During Treatment: Gait belt Activity Tolerance: Other (comment) (limited by depression) Patient left: in bed;with call bell/phone within reach;with bed alarm set Nurse Communication: Mobility status PT Visit Diagnosis: Muscle weakness (generalized) (M62.81);Other abnormalities of gait and mobility (R26.89);Difficulty in walking, not elsewhere classified (R26.2);Pain Pain - Right/Left: Left Pain - part of body: Hip     Time: 8756-4332 PT Time Calculation (min) (ACUTE ONLY): 26 min  Charges:  $Therapeutic Exercise: 8-22 mins $Therapeutic Activity: 8-22 mins                     Benjiman Core, PTA Acute Rehab   Allena Katz 08/15/2021, 3:52 PM
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
## 2021-08-16 LAB — TYPE AND SCREEN
ABO/RH(D): A POS
Antibody Screen: NEGATIVE
Unit division: 0
Unit division: 0

## 2021-08-16 LAB — BASIC METABOLIC PANEL WITH GFR
Anion gap: 9 (ref 5–15)
BUN: 30 mg/dL — ABNORMAL HIGH (ref 8–23)
CO2: 30 mmol/L (ref 22–32)
Calcium: 7.9 mg/dL — ABNORMAL LOW (ref 8.9–10.3)
Chloride: 97 mmol/L — ABNORMAL LOW (ref 98–111)
Creatinine, Ser: 2.91 mg/dL — ABNORMAL HIGH (ref 0.61–1.24)
GFR, Estimated: 24 mL/min — ABNORMAL LOW
Glucose, Bld: 194 mg/dL — ABNORMAL HIGH (ref 70–99)
Potassium: 3.5 mmol/L (ref 3.5–5.1)
Sodium: 136 mmol/L (ref 135–145)

## 2021-08-16 LAB — CBC
HCT: 29.2 % — ABNORMAL LOW (ref 39.0–52.0)
Hemoglobin: 9.5 g/dL — ABNORMAL LOW (ref 13.0–17.0)
MCH: 28.4 pg (ref 26.0–34.0)
MCHC: 32.5 g/dL (ref 30.0–36.0)
MCV: 87.4 fL (ref 80.0–100.0)
Platelets: 277 10*3/uL (ref 150–400)
RBC: 3.34 MIL/uL — ABNORMAL LOW (ref 4.22–5.81)
RDW: 14.1 % (ref 11.5–15.5)
WBC: 11.1 10*3/uL — ABNORMAL HIGH (ref 4.0–10.5)
nRBC: 0 % (ref 0.0–0.2)

## 2021-08-16 LAB — BPAM RBC
Blood Product Expiration Date: 202308282359
Blood Product Expiration Date: 202308292359
ISSUE DATE / TIME: 202308091426
Unit Type and Rh: 6200
Unit Type and Rh: 6200

## 2021-08-16 LAB — GLUCOSE, CAPILLARY
Glucose-Capillary: 174 mg/dL — ABNORMAL HIGH (ref 70–99)
Glucose-Capillary: 160 mg/dL — ABNORMAL HIGH (ref 70–99)
Glucose-Capillary: 146 mg/dL — ABNORMAL HIGH (ref 70–99)

## 2021-08-16 MED ORDER — SENNA 8.6 MG PO TABS
1.0000 | ORAL_TABLET | Freq: Every day | ORAL | Status: DC
  Administered 2021-08-17 – 2021-08-20 (×4): 8.6 mg via ORAL
  Filled 2021-08-16 (×5): qty 1

## 2021-08-16 MED ORDER — CHLORHEXIDINE GLUCONATE CLOTH 2 % EX PADS: 6.0000 | MEDICATED_PAD | Freq: Every day | CUTANEOUS | Status: DC

## 2021-08-16 MED ORDER — ACETAMINOPHEN 500 MG PO TABS
1000.0000 mg | ORAL_TABLET | Freq: Three times a day (TID) | ORAL | Status: DC
  Administered 2021-08-16 – 2021-08-20 (×10): 1000 mg via ORAL
  Filled 2021-08-16 (×12): qty 2

## 2021-08-16 MED ORDER — OXYCODONE HCL 5 MG PO TABS
10.0000 mg | ORAL_TABLET | ORAL | Status: DC | PRN
  Administered 2021-08-16 – 2021-08-20 (×7): 10 mg via ORAL
  Filled 2021-08-16 (×7): qty 2

## 2021-08-16 NOTE — Progress Notes (Signed)
NAME:  [name], MRN:  [id number], DOB:  1960/04/05, LOS: 6 ADMISSION DATE:  08/10/2021  Subjective  Patient evaluated at bedside this AM. No acute changes overnight. Mentions he wants to go home - tired of going into the hospital and "getting all my body parts chopped off." No new symptoms. Discussed disposition planning.   Objective   Blood pressure 133/66, pulse 77, temperature 97.8 F (36.6 C), temperature source Oral, resp. rate 19, height 6' (1.829 m), weight 66.3 kg, SpO2 100 %.     Intake/Output Summary (Last 24 hours) at 08/16/2021 1705 Last data filed at 08/16/2021 0500 Gross per 24 hour  Intake 240 ml  Output 400 ml  Net -160 ml   Filed Weights   08/13/21 0411 08/14/21 1318 08/14/21 1736  Weight: 65 kg 67.4 kg 66.3 kg   Physical Exam: General: Resting comfortably in no acute distress CV: Regular rate, rhythm. No murmurs appreciated Pulm: Normal work of breathing on room air.  Abdomen: Soft, non-tender, non-distended. Normoactive bowel sounds MSK: Wound vac in place on L AKA site. No new finger lesions on fingertips. Bilateral distal black lesions on fingertips unchanged.  Neuro: Awake, alert, conversing appropriately. Grossly non-focal  Labs       Latest Ref Rng & Units 08/16/2021    1:04 AM 08/14/2021    1:39 AM 08/13/2021    7:19 AM  CBC  WBC 4.0 - 10.5 K/uL 11.1  11.2  11.0   Hemoglobin 13.0 - 17.0 g/dL 9.5  9.4  9.2   Hematocrit 39.0 - 52.0 % 29.2  29.1  28.7   Platelets 150 - 400 K/uL 277  258  245       Latest Ref Rng & Units 08/16/2021    1:04 AM 08/14/2021    1:39 AM 08/13/2021    7:19 AM  BMP  Glucose 70 - 99 mg/dL 194  163  127   BUN 8 - 23 mg/dL '30  30  11   '$ Creatinine 0.61 - 1.24 mg/dL 2.91  3.06  2.17   Sodium 135 - 145 mmol/L 136  133  135   Potassium 3.5 - 5.1 mmol/L 3.5  3.5  3.5   Chloride 98 - 111 mmol/L 97  97  96   CO2 22 - 32 mmol/L '30  25  28   '$ Calcium 8.9 - 10.3 mg/dL 7.9  7.9  8.0     Summary  [name] is 61yo person with  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   Assessment & Plan:  Principal Problem:   Infection of amputation stump, left lower extremity ([place]) Active Problems:   ESRD on dialysis [facility])   Atherosclerosis of native arteries of extremities with gangrene, left leg (HCC)   Gangrene of left foot (HCC)   Type 2 diabetes mellitus with diabetic peripheral angiopathy and gangrene, with long-term current use of insulin (HCC)  #Dry gangrene of L foot, POD4 L AKA #Peripheral arterial disease No acute changes since yesterday. Wound vac in place - per Dr. Jess Barters note, should be able to take off in next 1-2 days. Discussed upper extremity work-up with vascular surgery, who is planning to complete the work-up as an outpatient. Patient is medically stable for discharge, will plan for CIR once insurance approves and bed is available.  - Wound  vac in place, management per ortho - Tylenol '1000mg'$  three times daily scheduled - Oxycodone '10mg'$  every 4 hours as needed - Bowel regimen in place - Follow-up vascular surgery as outpatient - Pending CIR on discharge  #ESRD on iHD Patient is currently on his regularly scheduled hemodialysis. Appreciate nephrology's assistance and recommendations. - Dialysis per nephrology - Daily BMP  #Type II diabetes mellitus Sugars have been at goal, no medication changes needed at this time. - Continue sliding scale  Best practice:  DIET: Renal IVF: n/a DVT PPX: heparin BOWEL: senna CODE: FULL FAM COM: n/a  Sanjuan Dame, MD Internal Medicine Resident PGY-3 PAGER: [phone] 08/16/2021 5:05 PM  If after hours (below), please contact on-call pager: [phone] 5PM-7AM Monday-Friday 1PM-7AM Saturday-Sunday

## 2021-08-16 NOTE — Progress Notes (Signed)
[facility] Progress Note   Subjective:   Pt seen in room. Reports he is tired today, no other new concerns. Denies SOB, CP, dizziness and nausea.   Objective Vitals:   08/15/21 0622 08/15/21 0852 08/15/21 1329 08/15/21 2224  BP: (!) 147/70 (!) 149/71 (!) 148/69 (!) 144/67  Pulse: 83 82 82 84  Resp: '18 18 18 19  '$ Temp: 98 F (36.7 C) 98.4 F (36.9 C) 98 F (36.7 C) 98.9 F (37.2 C)  TempSrc: Oral   Oral  SpO2: 96% 100% 95% 100%  Weight:      Height:       Physical Exam General: Alert male in NAD Heart: RRR, no murmurs, rubs or gallops Lungs: CTA bilaterally without wheezing, rhonchi or rales Abdomen: Soft, non-distended, +BS Extremities: L AKA with wound vac, no edema b/l lower extremities Dialysis Access: TDC, LUE AVF + bruit  Additional Objective Labs: Basic Metabolic Panel: Recent Labs  Lab 08/11/21 0757 08/12/21 1231 08/13/21 0719 08/14/21 0139 08/16/21 0104  NA 130*   < > 135 133* 136  K 3.2*   < > 3.5 3.5 3.5  CL 93*   < > 96* 97* 97*  CO2 21*  --  '28 25 30  '$ GLUCOSE 186*   < > 127* 163* 194*  BUN 30*   < > 11 30* 30*  CREATININE 4.64*   < > 2.17* 3.06* 2.91*  CALCIUM 8.0*  --  8.0* 7.9* 7.9*  PHOS 3.7  --   --   --   --    < > = values in this interval not displayed.   Liver Function Tests: Recent Labs  Lab 08/10/21 1416 08/11/21 0757  AST 14*  --   ALT 12  --   ALKPHOS 87  --   BILITOT 1.5*  --   PROT 8.1  --   ALBUMIN 2.9* 2.3*   No results for input(s): "LIPASE", "AMYLASE" in the last 168 hours. CBC: Recent Labs  Lab 08/11/21 0757 08/12/21 0210 08/12/21 1231 08/13/21 0719 08/14/21 0139 08/16/21 0104  WBC 23.2* 12.2*  --  11.0* 11.2* 11.1*  NEUTROABS  --  10.5*  --   --   --   --   HGB 7.7* 7.9*   < > 9.2* 9.4* 9.5*  HCT 22.7* 24.0*   < > 28.7* 29.1* 29.2*  MCV 85.7 86.3  --  87.8 87.4 87.4  PLT 293 217  --  245 258 277   < > = values in this interval not displayed.   Blood Culture    Component Value Date/Time    SDES BLOOD SITE NOT SPECIFIED 08/10/2021 1416   SDES BLOOD SITE NOT SPECIFIED 08/10/2021 1416   SPECREQUEST  08/10/2021 1416    BOTTLES DRAWN AEROBIC ONLY Blood Culture adequate volume   SPECREQUEST  08/10/2021 1416    BOTTLES DRAWN AEROBIC ONLY Blood Culture adequate volume   CULT  08/10/2021 1416    NO GROWTH 5 DAYS Performed at [facility], [place] [address] [place], [place] [address]    CULT  08/10/2021 1416    NO GROWTH 5 DAYS Performed at [facility], [place] [address], [place], [place] [address]    REPTSTATUS 08/15/2021 FINAL 08/10/2021 1416   REPTSTATUS 08/15/2021 FINAL 08/10/2021 1416    Cardiac Enzymes: No results for input(s): "CKTOTAL", "CKMB", "CKMBINDEX", "TROPONINI" in the last 168 hours. CBG: Recent Labs  Lab 08/15/21 0848 08/15/21 1112 08/15/21 1610 08/15/21 2219 08/16/21 0818  GLUCAP 151* 166* 148* 172* 174*   Iron Studies: No results for input(s): "IRON", "TIBC", "TRANSFERRIN", "FERRITIN" in the last 72 hours. '@lablastinr3'$ @ Studies/Results: No results found. Medications:  sodium chloride Stopped (08/13/21 0009)   anticoagulant sodium citrate     magnesium sulfate bolus IVPB      (feeding supplement) PROSource Plus  30 mL Oral BID BM   amLODipine  5 mg Oral Daily   vitamin C  1,000 mg Oral Daily   Chlorhexidine Gluconate Cloth  6 each Topical Q0600   darbepoetin (ARANESP) injection - DIALYSIS  40 mcg Intravenous Q Wed-HD   docusate sodium  100 mg Oral Daily   heparin injection (subcutaneous)  5,000 Units Subcutaneous Q8H   insulin aspart  0-5 Units Subcutaneous QHS   insulin aspart  0-6 Units Subcutaneous TID WC   melatonin  5 mg Oral QHS   mupirocin ointment  1 Application Nasal BID   nutrition supplement (JUVEN)  1 packet Oral BID BM   pantoprazole  40 mg Oral Daily   zinc sulfate  220 mg Oral Daily    Dialysis Orders: [place] MWF Time: 4:00  EDW: 72 kg  Flows: 400/700  Bath: 3K/2.5 Ca  Access: TDC ; L AVF (placed 07/02/21)   Heparin: No bolus heparin ESA: None  VDRA: Hectorol 3 TIW   Assessment/Plan: Left limb gangrene/ischemia.  Vascular/Ortho consulted. 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 Per Dr. Sharol Given.  2. ESRD -  HD MWF. Not adhearent to Rx as outpatient. Back on schedule. Next HD Monday  3. Hand ischemia - VVS following. Planning arteriogram and fistula ligation.  4. Hypertension/volume  - BP has been variable, moderately elevated today.  Below his EDW (has likely lost some weight) with no volume excess on exam. Was on amlodipine '10mg'$  daily at home, resumed at lower dose of '5mg'$  daily on 08/15/21  5. Anemia  - Hgb 9.4. Aranesp 40 given 0/37.  6. Metabolic bone disease -  Corr Calcium/phos within goal. Continue home meds. 7. DMT2 -insulin per primary 8. Nutrition - Renal diet, added prot supps 9. Dispo - Possibly SNF/CIR    Anice Paganini, PA-C 08/16/2021, 8:22 AM  [facility] Pager: [phone]

## 2021-08-16 NOTE — Progress Notes (Signed)
CSW attempted to reach patient's friend [name] without success.  CSW spoke with patient's sister [name] who states the family is in agreement for placement at [facility] if insurance authorization is able to be obtained.  CSW sent message to [facility] admissions coordinator Eugenia to follow up.  [name], MSW, LCSW Transitions of Care  Clinical Social Worker II [phone]

## 2021-08-17 DIAGNOSIS — Z992 Dependence on renal dialysis: Secondary | ICD-10-CM | POA: Diagnosis not present

## 2021-08-17 DIAGNOSIS — T8744 Infection of amputation stump, left lower extremity: Secondary | ICD-10-CM | POA: Diagnosis not present

## 2021-08-17 DIAGNOSIS — Z89612 Acquired absence of left leg above knee: Secondary | ICD-10-CM

## 2021-08-17 DIAGNOSIS — N186 End stage renal disease: Secondary | ICD-10-CM | POA: Diagnosis not present

## 2021-08-17 DIAGNOSIS — E1152 Type 2 diabetes mellitus with diabetic peripheral angiopathy with gangrene: Secondary | ICD-10-CM | POA: Diagnosis not present

## 2021-08-17 LAB — CBC
HCT: 31.1 % — ABNORMAL LOW (ref 39.0–52.0)
Hemoglobin: 10.2 g/dL — ABNORMAL LOW (ref 13.0–17.0)
MCH: 28.5 pg (ref 26.0–34.0)
MCHC: 32.8 g/dL (ref 30.0–36.0)
MCV: 86.9 fL (ref 80.0–100.0)
Platelets: 304 10*3/uL (ref 150–400)
RBC: 3.58 MIL/uL — ABNORMAL LOW (ref 4.22–5.81)
RDW: 14 % (ref 11.5–15.5)
WBC: 8.7 10*3/uL (ref 4.0–10.5)
nRBC: 0 % (ref 0.0–0.2)

## 2021-08-17 LAB — BASIC METABOLIC PANEL
Anion gap: 9 (ref 5–15)
BUN: 41 mg/dL — ABNORMAL HIGH (ref 8–23)
CO2: 30 mmol/L (ref 22–32)
Calcium: 7.9 mg/dL — ABNORMAL LOW (ref 8.9–10.3)
Chloride: 94 mmol/L — ABNORMAL LOW (ref 98–111)
Creatinine, Ser: 3.28 mg/dL — ABNORMAL HIGH (ref 0.61–1.24)
GFR, Estimated: 21 mL/min — ABNORMAL LOW (ref >60)
Glucose, Bld: 201 mg/dL — ABNORMAL HIGH (ref 70–99)
Potassium: 3.4 mmol/L — ABNORMAL LOW (ref 3.5–5.1)
Sodium: 133 mmol/L — ABNORMAL LOW (ref 135–145)

## 2021-08-17 LAB — SURGICAL PATHOLOGY

## 2021-08-17 LAB — GLUCOSE, CAPILLARY
Glucose-Capillary: 104 mg/dL — ABNORMAL HIGH (ref 70–99)
Glucose-Capillary: 185 mg/dL — ABNORMAL HIGH (ref 70–99)
Glucose-Capillary: 206 mg/dL — ABNORMAL HIGH (ref 70–99)
Glucose-Capillary: 94 mg/dL (ref 70–99)

## 2021-08-17 MED ORDER — ANTICOAGULANT SODIUM CITRATE 4% (200MG/5ML) IV SOLN
5.0000 mL | Status: DC | PRN
  Filled 2021-08-17: qty 5

## 2021-08-17 MED ORDER — ALTEPLASE 2 MG IJ SOLR
2.0000 mg | Freq: Once | INTRAMUSCULAR | Status: DC | PRN
  Filled 2021-08-17: qty 2

## 2021-08-17 NOTE — Progress Notes (Signed)
[facility] Progress Note   Subjective:  Seen in room. No new concerns this am. Pain controlled. For dialysis today.    Objective Vitals:   08/16/21 1418 08/16/21 2000 08/17/21 0423 08/17/21 0756  BP: 133/66 (!) 150/77 130/66 124/67  Pulse: 77 82 77 77  Resp: '19 19 18 18  '$ Temp: 97.8 F (36.6 C) 98.2 F (36.8 C) 98.7 F (37.1 C) 98.4 F (36.9 C)  TempSrc: Oral Oral Oral   SpO2: 100% 100% 100% 97%  Weight:      Height:       Physical Exam General: Alert male in NAD Heart: RRR, no murmurs, rubs or gallops Lungs: CTA bilaterally without wheezing, rhonchi or rales Abdomen: Soft, non-distended, +BS Extremities: L AKA with wound vac, no edema b/l lower extremities Dialysis Access: TDC, LUE AVF + bruit  Additional Objective Labs: Basic Metabolic Panel: Recent Labs  Lab 08/11/21 0757 08/12/21 1231 08/14/21 0139 08/16/21 0104 08/17/21 0322  NA 130*   < > 133* 136 133*  K 3.2*   < > 3.5 3.5 3.4*  CL 93*   < > 97* 97* 94*  CO2 21*   < > '25 30 30  '$ GLUCOSE 186*   < > 163* 194* 201*  BUN 30*   < > 30* 30* 41*  CREATININE 4.64*   < > 3.06* 2.91* 3.28*  CALCIUM 8.0*   < > 7.9* 7.9* 7.9*  PHOS 3.7  --   --   --   --    < > = values in this interval not displayed.    Liver Function Tests: Recent Labs  Lab 08/10/21 1416 08/11/21 0757  AST 14*  --   ALT 12  --   ALKPHOS 87  --   BILITOT 1.5*  --   PROT 8.1  --   ALBUMIN 2.9* 2.3*    No results for input(s): "LIPASE", "AMYLASE" in the last 168 hours. CBC: Recent Labs  Lab 08/12/21 0210 08/12/21 1231 08/13/21 0719 08/14/21 0139 08/16/21 0104 08/17/21 0322  WBC 12.2*  --  11.0* 11.2* 11.1* 8.7  NEUTROABS 10.5*  --   --   --   --   --   HGB 7.9*   < > 9.2* 9.4* 9.5* 10.2*  HCT 24.0*   < > 28.7* 29.1* 29.2* 31.1*  MCV 86.3  --  87.8 87.4 87.4 86.9  PLT 217  --  245 258 277 304   < > = values in this interval not displayed.    Blood Culture    Component Value Date/Time   SDES BLOOD SITE NOT  SPECIFIED 08/10/2021 1416   SDES BLOOD SITE NOT SPECIFIED 08/10/2021 1416   SPECREQUEST  08/10/2021 1416    BOTTLES DRAWN AEROBIC ONLY Blood Culture adequate volume   SPECREQUEST  08/10/2021 1416    BOTTLES DRAWN AEROBIC ONLY Blood Culture adequate volume   CULT  08/10/2021 1416    NO GROWTH 5 DAYS Performed at [facility], [place] [address], [place], [place] [address]    CULT  08/10/2021 1416    NO GROWTH 5 DAYS Performed at [facility], [place] [address] [place], [place] [address]    REPTSTATUS 08/15/2021 FINAL 08/10/2021 1416   REPTSTATUS 08/15/2021 FINAL 08/10/2021 1416    Cardiac Enzymes: No results for input(s): "CKTOTAL", "CKMB", "CKMBINDEX", "TROPONINI" in the last 168 hours. CBG: Recent Labs  Lab 08/15/21 2219 08/16/21 0818 08/16/21 1137 08/16/21 1548 08/17/21 0800  GLUCAP 172* 174*  160* 146* 206*    Iron Studies: No results for input(s): "IRON", "TIBC", "TRANSFERRIN", "FERRITIN" in the last 72 hours. '@lablastinr3'$ @ Studies/Results: No results found. Medications:  sodium chloride Stopped (08/13/21 0009)   anticoagulant sodium citrate      (feeding supplement) PROSource Plus  30 mL Oral BID BM   acetaminophen  1,000 mg Oral TID   amLODipine  5 mg Oral Daily   vitamin C  1,000 mg Oral Daily   Chlorhexidine Gluconate Cloth  6 each Topical Q0600   darbepoetin (ARANESP) injection - DIALYSIS  40 mcg Intravenous Q Wed-HD   heparin injection (subcutaneous)  5,000 Units Subcutaneous Q8H   insulin aspart  0-6 Units Subcutaneous TID WC   melatonin  5 mg Oral QHS   nutrition supplement (JUVEN)  1 packet Oral BID BM   pantoprazole  40 mg Oral Daily   senna  1 tablet Oral Daily   zinc sulfate  220 mg Oral Daily    Dialysis Orders: [place] MWF Time: 4:00  EDW: 72 kg  Flows: 400/700  Bath: 3K/2.5 Ca  Access: TDC ; L AVF (placed 07/02/21)  Heparin: No bolus heparin ESA: None  VDRA: Hectorol 3 TIW   Assessment/Plan: Left limb gangrene/ischemia.   Vascular/Ortho consulted. 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 Per Dr. Sharol Given.  2. ESRD -  HD MWF. Not adhearent to Rx as outpatient. Back on schedule. Next HD Monday  3. Hand ischemia - VVS following. Planning arteriogram and fistula ligation.  4. Hypertension/volume  - BP has been variable, moderately elevated today.  Below his EDW (has likely lost some weight) with no volume excess on exam. Was on amlodipine '10mg'$  daily at home, resumed at lower dose of '5mg'$  daily on 08/15/21  5. Anemia  - Hgb 9.4. Aranesp 40 given 6/06.  6. Metabolic bone disease -  Corr Calcium/phos within goal. Continue home meds. 7. DMT2 -insulin per primary 8. Nutrition - Renal diet, added prot supps 9. Dispo - Possibly SNF/CIR    Lynnda Child PA-C [facility] 08/17/2021,10:07 AM

## 2021-08-17 NOTE — H&P (View-Only) (Signed)
  Progress Note    08/17/2021 7:46 AM 5 Days Post-Op  Subjective:  no complaints   Vitals:   08/16/21 2000 08/17/21 0423  BP: (!) 150/77 130/66  Pulse: 82 77  Resp: 19 18  Temp: 98.2 F (36.8 C) 98.7 F (37.1 C)  SpO2: 100% 100%   Physical Exam: Lungs:  non labored Extremities:  finger tips gangrenous; palpable R radial pulse; palpable thrill L arm basilic vein fistula Neurologic: A&O  CBC    Component Value Date/Time   WBC 8.7 08/17/2021 0322   RBC 3.58 (L) 08/17/2021 0322   HGB 10.2 (L) 08/17/2021 0322   HGB 10.2 (L) 07/20/2021 1639   HCT 31.1 (L) 08/17/2021 0322   HCT 31.0 (L) 07/20/2021 1639   PLT 304 08/17/2021 0322   PLT 264 07/20/2021 1639   MCV 86.9 08/17/2021 0322   MCV 87 07/20/2021 1639   MCH 28.5 08/17/2021 0322   MCHC 32.8 08/17/2021 0322   RDW 14.0 08/17/2021 0322   RDW 13.1 07/20/2021 1639   LYMPHSABS 0.9 08/12/2021 0210   LYMPHSABS 1.7 02/03/2021 1634   MONOABS 0.7 08/12/2021 0210   EOSABS 0.0 08/12/2021 0210   EOSABS 0.2 02/03/2021 1634   BASOSABS 0.0 08/12/2021 0210   BASOSABS 0.0 02/03/2021 1634    BMET    Component Value Date/Time   NA 133 (L) 08/17/2021 0322   NA 136 03/02/2021 1630   K 3.4 (L) 08/17/2021 0322   CL 94 (L) 08/17/2021 0322   CO2 30 08/17/2021 0322   GLUCOSE 201 (H) 08/17/2021 0322   BUN 41 (H) 08/17/2021 0322   BUN 33 (H) 03/02/2021 1630   CREATININE 3.28 (H) 08/17/2021 0322   CREATININE 0.85 05/30/2013 1100   CALCIUM 7.9 (L) 08/17/2021 0322   GFRNONAA 21 (L) 08/17/2021 0322   GFRNONAA 70 04/11/2013 0825   GFRAA 33 (L) 08/01/2019 1510   GFRAA 81 04/11/2013 0825    INR    Component Value Date/Time   INR 1.4 (H) 08/11/2021 0757     Intake/Output Summary (Last 24 hours) at 08/17/2021 0746 Last data filed at 08/17/2021 7622 Gross per 24 hour  Intake 480 ml  Output 700 ml  Net -220 ml     Assessment/Plan:  61 y.o. male with gangrenous fingers of both hands 5 Days Post-Op   Gangrenous changes to  fingers have been worsening Plan will be bilateral upper extremity arteriograms to evaluate circulation.  This will be with Dr. Trula Slade tomorrow 08/18/21. Case was discussed with the patient and he is agreeable to proceed. He will also likely require L arm fistula ligation.  I will confirm with Dr. Trula Slade that this will happen at the same time of angiography  ADDENDUM 14:48 8/14  I had another discussion with the patient about the above procedures.  He will have bilateral upper extremity arteriograms with possible intervention with Dr. Trula Slade in the cath lab tomorrow 8/15.  He will then be brought to the OR for left arm fistula ligation.  Both cases were discussed in detail and all questions were answered.  Patient is agreeable to proceed.  He will be kept inpatient overnight in preparation.  NPO past midnight.  Informed consent ordered.  Dagoberto Ligas, PA-C [facility] [phone] 08/17/2021 7:46 AM

## 2021-08-17 NOTE — TOC Progression Note (Signed)
Transition of Care [facility]) - Progression Note    Patient Details  Name: [name] MRN: [id number] Date of Birth: 03-Dec-1960  Transition of Care [facility] CM/SW Contact  Bartholomew Crews, RN Phone Number: [phone] 08/17/2021, 11:58 AM  Clinical Narrative:     Spoke with patient at the bedside to discuss post acute transition. Patient not wanting to go to SNF stating family is able to provide care. Consent received to discuss transition plans with his family members. Confirmed DME - wheelchair, RW, 3n1, and leg prosthesis. Uses [place] [facility] ride share for dialysis transportation. Confirmed [facility] for PCP and [facility] outpatient pharmacy for preferred pharmacy. No home health services at this time.   Spoke with his sister, [name], at [phone] to discuss post acute transition. Advised that he was not a candidate for AIR and that he wants to go home. [name] stated that he does not have the family support for needed care. [name] spoke with patient on the phone and patient is now agreeable to SNF. CSW made aware.   Expected Discharge Plan: [place] Barriers to Discharge: Continued Medical Work up  Expected Discharge Plan and Services Expected Discharge Plan: [place] In-house Referral: Clinical Social Work Discharge Planning Services: CM Consult Post Acute Care Choice: [place] Living arrangements for the past 2 months: Apartment                                       Social Determinants of Health (SDOH) Interventions    Readmission Risk Interventions     No data to display

## 2021-08-17 NOTE — NC FL2 (Signed)
[place] LEVEL OF CARE SCREENING TOOL     IDENTIFICATION  Patient Name: [name] Birthdate: [birth year] Sex: male Admission Date (Current Location): 08/10/2021  [facility] and [place] Number:  Herbalist and Address:  [facility] [place] [place], [place] [address] [place], [place] [address]      Provider Number: [id number]  Attending Physician Name and Address:  Lottie Mussel, MD  Relative Name and Phone Number:  Casper Harrison [phone]    Current Level of Care: Hospital Recommended Level of Care: [place] Prior Approval Number:    Date Approved/Denied:   PASRR Number: [id number]  Discharge Plan: SNF    Current Diagnoses: Patient Active Problem List   Diagnosis Date Noted   Atherosclerosis of native arteries of extremities with gangrene, left leg ([place])    Gangrene of left foot ([place])    Type 2 diabetes mellitus with diabetic peripheral angiopathy and gangrene, with long-term current use of insulin ([place])    Infection of amputation stump, left lower extremity ([place]) 08/10/2021   Diarrhea 07/21/2021   Type 2 diabetes mellitus with ESRD (end-stage renal disease) ([place]) 05/22/2021   Non-STEMI (non-ST elevated myocardial infarction) ([place]) 05/22/2021   Retroperitoneal hematoma    ESRD on dialysis ([place])    Colon polyps 03/03/2021   Mild cognitive impairment with memory loss 02/03/2021   Difficulty urinating 02/03/2021   Hx of medication noncompliance 09/30/2020   Rash 09/22/2020   S/P transmetatarsal amputation of foot, left ([place]) 11/23/2019   S/P BKA (below knee amputation) unilateral, right ([place]) 08/17/2018   Pulmonary nodules/lesions, multiple 02/16/2018   Diabetic neuropathy ([place]) 05/30/2014   GERD (gastroesophageal reflux disease) 24/40/1027   Diastolic dysfunction 25/36/6440   Healthcare maintenance 02/15/2013   PVD (peripheral vascular disease) ([place]) 12/31/2011   HLD (hyperlipidemia) 03/06/2008   Hypertension,  essential 03/06/2008    Orientation RESPIRATION BLADDER Height & Weight     Self, Time, Situation, Place  Normal Continent Weight: 146 lb 2.6 oz (66.3 kg) Height:  6' (182.9 cm)  BEHAVIORAL SYMPTOMS/MOOD NEUROLOGICAL BOWEL NUTRITION STATUS      Continent Diet (see discharge summary)  AMBULATORY STATUS COMMUNICATION OF NEEDS Skin   Total Care Verbally Surgical wounds, Wound Vac (wound vac until 8/16)                       Personal Care Assistance Level of Assistance  Bathing, Feeding, Dressing Bathing Assistance: Maximum assistance Feeding assistance: Limited assistance Dressing Assistance: Maximum assistance     Functional Limitations Info  Sight, Hearing, Speech Sight Info: Adequate Hearing Info: Adequate Speech Info: Adequate    SPECIAL CARE FACTORS FREQUENCY  PT (By licensed PT), OT (By licensed OT)     PT Frequency: 5x week OT Frequency: 5x week            Contractures Contractures Info: Not present    Additional Factors Info  Code Status, Allergies, Insulin Sliding Scale Code Status Info: full Allergies Info: Benicar (Olmesartan)   Insulin Sliding Scale Info: Nvolog: see discharge summary       Current Medications (08/17/2021):  This is the current hospital active medication list Current Facility-Administered Medications  Medication Dose Route Frequency Provider Last Rate Last Admin   (feeding supplement) PROSource Plus liquid 30 mL  30 mL Oral BID BM Lynnda Child, PA-C   30 mL at 08/17/21 0952   0.9 %  sodium chloride infusion   Intravenous Continuous [name], MD  Stopped at 08/13/21 0009   acetaminophen (TYLENOL) tablet 1,000 mg  1,000 mg Oral TID Sanjuan Dame, MD   1,000 mg at 08/17/21 1401   alteplase (CATHFLO ACTIVASE) injection 2 mg  2 mg Intracatheter Once PRN [name], MD       amLODipine (NORVASC) tablet 5 mg  5 mg Oral Daily La Valle, Hervey Ard, PA-C   5 mg at 08/17/21 6503   anticoagulant sodium citrate solution  5 mL  5 mL Intracatheter PRN [name], MD       ascorbic acid (VITAMIN C) tablet 1,000 mg  1,000 mg Oral Daily [name], MD   1,000 mg at 08/17/21 5465   Chlorhexidine Gluconate Cloth 2 % PADS 6 each  6 each Topical Q0600 [name], MD   6 each at 08/17/21 0423   Darbepoetin Alfa (ARANESP) injection 40 mcg  40 mcg Intravenous Q Wed-HD [name], MD   40 mcg at 08/13/21 0045   guaiFENesin-dextromethorphan (ROBITUSSIN DM) 100-10 MG/5ML syrup 15 mL  15 mL Oral Q4H PRN [name], MD       heparin injection 1,000 Units  1,000 Units Intracatheter PRN [name], MD   1,000 Units at 08/14/21 1739   heparin injection 5,000 Units  5,000 Units Subcutaneous Q8H [name], MD   5,000 Units at 08/17/21 1401   insulin aspart (novoLOG) injection 0-6 Units  0-6 Units Subcutaneous TID WC [name], MD   1 Units at 08/17/21 1228   lidocaine (PF) (XYLOCAINE) 1 % injection 5 mL  5 mL Intradermal PRN [name], MD       lidocaine-prilocaine (EMLA) cream 1 Application  1 Application Topical PRN [name], MD       melatonin tablet 5 mg  5 mg Oral QHS [name], MD   5 mg at 08/16/21 2150   nutrition supplement (JUVEN) (JUVEN) powder packet 1 packet  1 packet Oral BID BM [name], MD   1 packet at 08/17/21 0953   ondansetron (ZOFRAN) injection 4 mg  4 mg Intravenous Q6H PRN [name], MD       Oral care mouth rinse  15 mL Mouth Rinse PRN [name], MD       oxyCODONE (Oxy IR/ROXICODONE) immediate release tablet 10 mg  10 mg Oral Q4H PRN Sanjuan Dame, MD   10 mg at 08/17/21 0328   pantoprazole (PROTONIX) EC tablet 40 mg  40 mg Oral Daily [name], MD   40 mg at 08/17/21 6812   pentafluoroprop-tetrafluoroeth (GEBAUERS) aerosol 1 Application  1 Application Topical PRN [name], MD       phenol (CHLORASEPTIC) mouth spray 1 spray  1 spray Mouth/Throat PRN [name], MD       polyethylene glycol (MIRALAX / GLYCOLAX) packet 17 g  17 g  Oral Daily PRN [name], MD       senna Donavan Burnet) tablet 8.6 mg  1 tablet Oral Daily Sanjuan Dame, MD   8.6 mg at 08/17/21 7517   zinc sulfate capsule 220 mg  220 mg Oral Daily [name], MD   220 mg at 08/17/21 0017     Discharge Medications: Please see discharge summary for a list of discharge medications.  Relevant Imaging Results:  Relevant Lab Results:   Additional Information SSN:  [id number]; Pfizer COVID-19 Vaccine 05/01/2019;  5'11" 162lbs;  HD:  MWF @ [facility]  Joanne Chars, LCSW

## 2021-08-17 NOTE — Progress Notes (Signed)
Physical Therapy Treatment Patient Details Name: [name] MRN: [id number] DOB: July 26, 1960 Today's Date: 08/17/2021   History of Present Illness Pt is a 61 yr old male who presented 08/10/21 due to weakness. Pt found to have dry gangrene of LLE. Pt s/p L AKA on 08/12/21. PMH: ESRD on HD, DM2, CHF, HLD, HLD, R BKA and L foot partial amputation in 2015    PT Comments    Pt agreeable to limited participation with encouragement from PT and pt sister. Pt requiring minimal assist for bed mobility. Worked on dynamic balance with functional reaching. Pt deferring transfer to chair. Unable to use prosthetic at this time due to R residual limb wound. Will benefit from practicing anterior posterior vs slide board transfer in future session.s    Recommendations for follow up therapy are one component of a multi-disciplinary discharge planning process, led by the attending physician.  Recommendations may be updated based on patient status, additional functional criteria and insurance authorization.  Follow Up Recommendations  Skilled nursing-short term rehab (<3 hours/day) Can patient physically be transported by private vehicle: No   Assistance Recommended at Discharge Frequent or constant Supervision/Assistance  Patient can return home with the following Two people to help with walking and/or transfers;Two people to help with bathing/dressing/bathroom;Direct supervision/assist for medications management;Direct supervision/assist for financial management;Assist for transportation;Help with stairs or ramp for entrance   Equipment Recommendations  None recommended by PT    Recommendations for Other Services       Precautions / Restrictions Precautions Precautions: Fall Restrictions Weight Bearing Restrictions: No LLE Weight Bearing: Non weight bearing Other Position/Activity Restrictions: wound vac     Mobility  Bed Mobility Overal bed mobility: Needs Assistance Bed Mobility: Supine to Sit,  Sit to Supine     Supine to sit: Min assist Sit to supine: Min assist   General bed mobility comments: Increased time/effort. Pt exiting towards left side of bed, cues for use of bed rail, minA to elevate trunk to upright and pt reaching for end of bed. Use of bed pad to guide hips back into supine position    Transfers                   General transfer comment: pt declined    Ambulation/Gait               General Gait Details: unable   Stairs             Wheelchair Mobility    Modified Rankin (Stroke Patients Only)       Balance Overall balance assessment: Needs assistance Sitting-balance support: Feet unsupported Sitting balance-Leahy Scale: Fair Sitting balance - Comments: Pt able to progress to no UE support with encouragement                                    Cognition Arousal/Alertness: Awake/alert Behavior During Therapy: WFL for tasks assessed/performed Overall Cognitive Status: No family/caregiver present to determine baseline cognitive functioning                                 General Comments: STM deficits noted, pt easily distracted and needs to be redirected to task consistently. decreased initiation. calling sister mid session despite asking pt to wait        Exercises Other Exercises Other Exercises: Sitting: functional reaching with single UE  then progress to bilateral UE's to challenge sitting balance    General Comments        Pertinent Vitals/Pain Pain Assessment Pain Assessment: Faces Faces Pain Scale: Hurts little more Pain Location: LLE Pain Descriptors / Indicators: Guarding, Grimacing, Aching Pain Intervention(s): Monitored during session    Home Living                          Prior Function            PT Goals (current goals can now be found in the care plan section) Acute Rehab PT Goals Patient Stated Goal: to manage his LLE PT Goal Formulation: Patient  unable to participate in goal setting Time For Goal Achievement: 08/27/21 Potential to Achieve Goals: Fair Progress towards PT goals: Progressing toward goals    Frequency    Min 2X/week      PT Plan Frequency needs to be updated    Co-evaluation              AM-PAC PT "6 Clicks" Mobility   Outcome Measure  Help needed turning from your back to your side while in a flat bed without using bedrails?: A Little Help needed moving from lying on your back to sitting on the side of a flat bed without using bedrails?: A Little Help needed moving to and from a bed to a chair (including a wheelchair)?: Total Help needed standing up from a chair using your arms (e.g., wheelchair or bedside chair)?: Total Help needed to walk in hospital room?: Total Help needed climbing 3-5 steps with a railing? : Total 6 Click Score: 10    End of Session   Activity Tolerance:  (self limiting) Patient left: in bed;with call bell/phone within reach;with bed alarm set Nurse Communication: Mobility status PT Visit Diagnosis: Muscle weakness (generalized) (M62.81);Other abnormalities of gait and mobility (R26.89);Difficulty in walking, not elsewhere classified (R26.2);Pain Pain - Right/Left: Left Pain - part of body: Hip     Time: 1435-1500 PT Time Calculation (min) (ACUTE ONLY): 25 min  Charges:  $Therapeutic Activity: 23-37 mins                     Wyona Almas, PT, DPT Acute Rehabilitation Services Office [phone]    Deno Etienne 08/17/2021, 4:03 PM

## 2021-08-17 NOTE — Progress Notes (Signed)
NAME:  [name], MRN:  [id number], DOB:  20-Apr-1960, LOS: 7 ADMISSION DATE:  08/10/2021  Subjective  Patient evaluated at bedside this AM. No acute changes overnight. Patient reports adequate sleep and appetite. Reports increased pain on fingertips rated at 9/10 this morning. Also reports new onset left eye paint hat began last night, which he characterizes as "soft tissue" pain. Denies any changes in vision. Denies any alleviating or aggravating factors related to this.  Expressed frustration over his amputation.   Objective   Blood pressure 124/67, pulse 77, temperature 98.4 F (36.9 C), resp. rate 18, height 6' (1.829 m), weight 66.3 kg, SpO2 97 %.     Intake/Output Summary (Last 24 hours) at 08/17/2021 1241 Last data filed at 08/17/2021 0735 Gross per 24 hour  Intake 240 ml  Output 700 ml  Net -460 ml   Filed Weights   08/13/21 0411 08/14/21 1318 08/14/21 1736  Weight: 65 kg 67.4 kg 66.3 kg   Physical Exam: General: Resting comfortably in no acute distress Eyes: Orbits, eyelids, conjunctivae and sclera are normal. Pupils are equal, round, reactive to light with extraocular movements intact. Vision is grossly intact CV: Regular rate, rhythm. No murmurs appreciated Pulm: Normal work of breathing on room air.  Abdomen: Soft, non-tender, non-distended. Normoactive bowel sounds MSK:  Wound vac in place on L AKA site, clean margins at amputation site.  No new finger lesions on fingertips. Bilateral distal black lesions on fingertips unchanged.  Neuro: Awake, alert, conversing appropriately. Grossly non-focal  Labs       Latest Ref Rng & Units 08/17/2021    3:22 AM 08/16/2021    1:04 AM 08/14/2021    1:39 AM  CBC  WBC 4.0 - 10.5 K/uL 8.7  11.1  11.2   Hemoglobin 13.0 - 17.0 g/dL 10.2  9.5  9.4   Hematocrit 39.0 - 52.0 % 31.1  29.2  29.1   Platelets 150 - 400 K/uL 304  277  258       Latest Ref Rng & Units 08/17/2021    3:22 AM 08/16/2021    1:04 AM 08/14/2021    1:39 AM   BMP  Glucose 70 - 99 mg/dL 201  194  163   BUN 8 - 23 mg/dL 41  30  30   Creatinine 0.61 - 1.24 mg/dL 3.28  2.91  3.06   Sodium 135 - 145 mmol/L 133  136  133   Potassium 3.5 - 5.1 mmol/L 3.4  3.5  3.5   Chloride 98 - 111 mmol/L 94  97  97   CO2 22 - 32 mmol/L '30  30  25   '$ Calcium 8.9 - 10.3 mg/dL 7.9  7.9  7.9     Summary  Nikolaj Geraghty is 61yo person with 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   Assessment & Plan:  Principal Problem:   Infection of amputation stump, left lower extremity ([place]) Active Problems:   ESRD on dialysis [facility])   Atherosclerosis of native arteries of extremities with gangrene, left leg ([facility])   Gangrene of left foot ([facility])   Type 2 diabetes mellitus with diabetic peripheral angiopathy and gangrene, with long-term current use of insulin ([facility])  #Dry gangrene of L foot, POD4 L AKA #Peripheral arterial disease Patient appears well today. Wound vac in  place, plan to remove tomorrow or the following day. Discussed with Vascular plan for angiogram and fistula ligation tomorrow.  Patient is medically stable for discharge, will plan for SNF once insurance approves and bed is available.  - Wound vac in place, management per ortho - Continue Tylenol '1000mg'$  three times daily scheduled - Continue Oxycodone '10mg'$  every 4 hours as needed - Bowel regimen in place - Follow-up vascular surgery as outpatient - Pending SNF on discharge  #ESRD on iHD Patient is currently on his regularly scheduled hemodialysis. Appreciate nephrology's assistance and recommendations. - Dialysis per nephrology - Daily BMP  #Type II diabetes mellitus Sugars have been at goal, no medication changes needed at this time. - Continue sliding scale  Best  practice:  DIET: Renal IVF: n/a DVT PPX: heparin BOWEL: senna CODE: FULL FAM COM: n/a  Christene Slates, PGY-1 Internal Medicine Residency Pager: [phone]  08/17/2021 12:41 PM  If after hours (below), please contact on-call pager: [phone] 5PM-7AM Monday-Friday 1PM-7AM Saturday-Sunday

## 2021-08-17 NOTE — Progress Notes (Addendum)
  Progress Note    08/17/2021 7:46 AM 5 Days Post-Op  Subjective:  no complaints   Vitals:   08/16/21 2000 08/17/21 0423  BP: (!) 150/77 130/66  Pulse: 82 77  Resp: 19 18  Temp: 98.2 F (36.8 C) 98.7 F (37.1 C)  SpO2: 100% 100%   Physical Exam: Lungs:  non labored Extremities:  finger tips gangrenous; palpable R radial pulse; palpable thrill L arm basilic vein fistula Neurologic: A&O  CBC    Component Value Date/Time   WBC 8.7 08/17/2021 0322   RBC 3.58 (L) 08/17/2021 0322   HGB 10.2 (L) 08/17/2021 0322   HGB 10.2 (L) 07/20/2021 1639   HCT 31.1 (L) 08/17/2021 0322   HCT 31.0 (L) 07/20/2021 1639   PLT 304 08/17/2021 0322   PLT 264 07/20/2021 1639   MCV 86.9 08/17/2021 0322   MCV 87 07/20/2021 1639   MCH 28.5 08/17/2021 0322   MCHC 32.8 08/17/2021 0322   RDW 14.0 08/17/2021 0322   RDW 13.1 07/20/2021 1639   LYMPHSABS 0.9 08/12/2021 0210   LYMPHSABS 1.7 02/03/2021 1634   MONOABS 0.7 08/12/2021 0210   EOSABS 0.0 08/12/2021 0210   EOSABS 0.2 02/03/2021 1634   BASOSABS 0.0 08/12/2021 0210   BASOSABS 0.0 02/03/2021 1634    BMET    Component Value Date/Time   NA 133 (L) 08/17/2021 0322   NA 136 03/02/2021 1630   K 3.4 (L) 08/17/2021 0322   CL 94 (L) 08/17/2021 0322   CO2 30 08/17/2021 0322   GLUCOSE 201 (H) 08/17/2021 0322   BUN 41 (H) 08/17/2021 0322   BUN 33 (H) 03/02/2021 1630   CREATININE 3.28 (H) 08/17/2021 0322   CREATININE 0.85 05/30/2013 1100   CALCIUM 7.9 (L) 08/17/2021 0322   GFRNONAA 21 (L) 08/17/2021 0322   GFRNONAA 70 04/11/2013 0825   GFRAA 33 (L) 08/01/2019 1510   GFRAA 81 04/11/2013 0825    INR    Component Value Date/Time   INR 1.4 (H) 08/11/2021 0757     Intake/Output Summary (Last 24 hours) at 08/17/2021 0746 Last data filed at 08/17/2021 0092 Gross per 24 hour  Intake 480 ml  Output 700 ml  Net -220 ml     Assessment/Plan:  61 y.o. male with gangrenous fingers of both hands 5 Days Post-Op   Gangrenous changes to  fingers have been worsening Plan will be bilateral upper extremity arteriograms to evaluate circulation.  This will be with Dr. Trula Slade tomorrow 08/18/21. Case was discussed with the patient and he is agreeable to proceed. He will also likely require L arm fistula ligation.  I will confirm with Dr. Trula Slade that this will happen at the same time of angiography  ADDENDUM 14:48 8/14  I had another discussion with the patient about the above procedures.  He will have bilateral upper extremity arteriograms with possible intervention with Dr. Trula Slade in the cath lab tomorrow 8/15.  He will then be brought to the OR for left arm fistula ligation.  Both cases were discussed in detail and all questions were answered.  Patient is agreeable to proceed.  He will be kept inpatient overnight in preparation.  NPO past midnight.  Informed consent ordered.  [name] Ligas, PA-C [facility] [phone] 08/17/2021 7:46 AM

## 2021-08-17 NOTE — Progress Notes (Signed)
Hd tx of 4hrs completed. 96L total vol processed. No complications noted. Report given to primary rn.  Total uf removed: 1587m Post hd weight: 67.6kgs Post hd v/s: 98.1 139/61(85) 81 16 100%

## 2021-08-17 NOTE — Progress Notes (Signed)
Inpatient Rehab Admissions Coordinator:    PT and OT are recommending SNF for this Pt. And Pt. Is refusing transfers at this time. I do not believe that Pt. Can tolerate the intensity of CIR at this time. Additionally, pt.'s payor source is very unlikely to approve CIR for Pt.'s diagnosis. I will not pursue CIR admit. Recommend TOC look for other rehab venues.   Clemens Catholic, [place], [place] Admissions Coordinator  [phone] ([place]) [phone] (office)

## 2021-08-18 ENCOUNTER — Other Ambulatory Visit: Payer: Self-pay

## 2021-08-18 ENCOUNTER — Inpatient Hospital Stay (HOSPITAL_COMMUNITY): Payer: Medicare Other | Admitting: Anesthesiology

## 2021-08-18 ENCOUNTER — Encounter (HOSPITAL_COMMUNITY)
Admission: EM | Disposition: A | Discharge status: Skilled Nursing Facility | Payer: Self-pay | Source: Home / Self Care | Attending: Internal Medicine

## 2021-08-18 ENCOUNTER — Encounter (HOSPITAL_COMMUNITY): Payer: Self-pay | Admitting: Internal Medicine

## 2021-08-18 ENCOUNTER — Ambulatory Visit (HOSPITAL_COMMUNITY)
Admission: RE | Admit: 2021-08-18 | Discharge: 2021-08-18 | Disposition: A | Discharge status: Home / Self Care | Payer: Medicare Other | Source: Ambulatory Visit | Attending: Surgery | Admitting: Surgery

## 2021-08-18 DIAGNOSIS — N186 End stage renal disease: Secondary | ICD-10-CM

## 2021-08-18 DIAGNOSIS — N189 Chronic kidney disease, unspecified: Secondary | ICD-10-CM

## 2021-08-18 DIAGNOSIS — E1122 Type 2 diabetes mellitus with diabetic chronic kidney disease: Secondary | ICD-10-CM

## 2021-08-18 DIAGNOSIS — I509 Heart failure, unspecified: Secondary | ICD-10-CM

## 2021-08-18 DIAGNOSIS — T82898A Other specified complication of vascular prosthetic devices, implants and grafts, initial encounter: Secondary | ICD-10-CM

## 2021-08-18 DIAGNOSIS — F17200 Nicotine dependence, unspecified, uncomplicated: Secondary | ICD-10-CM

## 2021-08-18 DIAGNOSIS — T8744 Infection of amputation stump, left lower extremity: Secondary | ICD-10-CM | POA: Diagnosis not present

## 2021-08-18 DIAGNOSIS — D631 Anemia in chronic kidney disease: Secondary | ICD-10-CM

## 2021-08-18 DIAGNOSIS — E1152 Type 2 diabetes mellitus with diabetic peripheral angiopathy with gangrene: Secondary | ICD-10-CM | POA: Diagnosis not present

## 2021-08-18 DIAGNOSIS — I132 Hypertensive heart and chronic kidney disease with heart failure and with stage 5 chronic kidney disease, or end stage renal disease: Secondary | ICD-10-CM

## 2021-08-18 DIAGNOSIS — Z992 Dependence on renal dialysis: Secondary | ICD-10-CM | POA: Diagnosis not present

## 2021-08-18 HISTORY — PX: UPPER EXTREMITY ANGIOGRAPHY: CATH118270

## 2021-08-18 HISTORY — PX: AORTIC ARCH ANGIOGRAPHY: CATH118224

## 2021-08-18 HISTORY — PX: LIGATION OF ARTERIOVENOUS  FISTULA: SHX5948

## 2021-08-18 LAB — RENAL FUNCTION PANEL
Albumin: 2.2 g/dL — ABNORMAL LOW (ref 3.5–5.0)
Anion gap: 6 (ref 5–15)
BUN: 16 mg/dL (ref 8–23)
CO2: 31 mmol/L (ref 22–32)
Calcium: 7.9 mg/dL — ABNORMAL LOW (ref 8.9–10.3)
Chloride: 98 mmol/L (ref 98–111)
Creatinine, Ser: 1.8 mg/dL — ABNORMAL HIGH (ref 0.61–1.24)
GFR, Estimated: 42 mL/min — ABNORMAL LOW (ref >60)
Glucose, Bld: 173 mg/dL — ABNORMAL HIGH (ref 70–99)
Phosphorus: 1.6 mg/dL — ABNORMAL LOW (ref 2.5–4.6)
Potassium: 3.2 mmol/L — ABNORMAL LOW (ref 3.5–5.1)
Sodium: 135 mmol/L (ref 135–145)

## 2021-08-18 LAB — GLUCOSE, CAPILLARY
Glucose-Capillary: 174 mg/dL — ABNORMAL HIGH (ref 70–99)
Glucose-Capillary: 181 mg/dL — ABNORMAL HIGH (ref 70–99)
Glucose-Capillary: 175 mg/dL — ABNORMAL HIGH (ref 70–99)
Glucose-Capillary: 154 mg/dL — ABNORMAL HIGH (ref 70–99)
Glucose-Capillary: 148 mg/dL — ABNORMAL HIGH (ref 70–99)
Glucose-Capillary: 140 mg/dL — ABNORMAL HIGH (ref 70–99)

## 2021-08-18 SURGERY — LIGATION OF ARTERIOVENOUS  FISTULA
Anesthesia: Monitor Anesthesia Care | Site: Arm Upper | Laterality: Left

## 2021-08-18 SURGERY — UPPER EXTREMITY ANGIOGRAPHY
Anesthesia: LOCAL

## 2021-08-18 MED ORDER — SODIUM CHLORIDE 0.9 % IV SOLN: 250.0000 mL | INTRAVENOUS | Status: DC | PRN

## 2021-08-18 MED ORDER — OXYCODONE HCL 5 MG/5ML PO SOLN: 5.0000 mg | Freq: Once | ORAL | Status: DC | PRN

## 2021-08-18 MED ORDER — IODIXANOL 320 MG/ML IV SOLN
INTRAVENOUS | Status: DC | PRN
  Administered 2021-08-18: 155 mL

## 2021-08-18 MED ORDER — FENTANYL CITRATE (PF) 100 MCG/2ML IJ SOLN: INTRAMUSCULAR | Status: DC | PRN

## 2021-08-18 MED ORDER — HEPARIN 6000 UNIT IRRIGATION SOLUTION
Status: AC
  Filled 2021-08-18: qty 500

## 2021-08-18 MED ORDER — MIDAZOLAM HCL 2 MG/2ML IJ SOLN
INTRAMUSCULAR | Status: AC
  Filled 2021-08-18: qty 2

## 2021-08-18 MED ORDER — SODIUM CHLORIDE 0.9% FLUSH: 3.0000 mL | INTRAVENOUS | Status: DC | PRN

## 2021-08-18 MED ORDER — ACETAMINOPHEN 325 MG PO TABS: 650.0000 mg | ORAL_TABLET | ORAL | Status: DC | PRN

## 2021-08-18 MED ORDER — INSULIN ASPART 100 UNIT/ML IJ SOLN: 0.0000 [IU] | INTRAMUSCULAR | Status: DC | PRN

## 2021-08-18 MED ORDER — FENTANYL CITRATE (PF) 100 MCG/2ML IJ SOLN: 25.0000 ug | INTRAMUSCULAR | Status: DC | PRN

## 2021-08-18 MED ORDER — MIDAZOLAM HCL 2 MG/2ML IJ SOLN
INTRAMUSCULAR | Status: DC | PRN
  Administered 2021-08-18 (×2): 1 mg via INTRAVENOUS

## 2021-08-18 MED ORDER — FENTANYL CITRATE (PF) 100 MCG/2ML IJ SOLN
INTRAMUSCULAR | Status: AC
  Filled 2021-08-18: qty 2

## 2021-08-18 MED ORDER — MEPIVACAINE HCL (PF) 2 % IJ SOLN
INTRAMUSCULAR | Status: DC | PRN
  Administered 2021-08-18: 20 mL

## 2021-08-18 MED ORDER — LABETALOL HCL 5 MG/ML IV SOLN: 10.0000 mg | INTRAVENOUS | Status: DC | PRN

## 2021-08-18 MED ORDER — FENTANYL CITRATE (PF) 100 MCG/2ML IJ SOLN
INTRAMUSCULAR | Status: DC | PRN
  Administered 2021-08-18: 50 ug via INTRAVENOUS
  Administered 2021-08-18: 25 ug via INTRAVENOUS

## 2021-08-18 MED ORDER — SODIUM CHLORIDE 0.9% FLUSH
3.0000 mL | Freq: Two times a day (BID) | INTRAVENOUS | Status: DC
  Administered 2021-08-18 – 2021-08-20 (×4): 3 mL via INTRAVENOUS

## 2021-08-18 MED ORDER — FENTANYL CITRATE (PF) 250 MCG/5ML IJ SOLN
INTRAMUSCULAR | Status: DC | PRN
  Administered 2021-08-18: 50 ug via INTRAVENOUS

## 2021-08-18 MED ORDER — STERILE WATER FOR IRRIGATION IR SOLN
Status: DC | PRN
  Administered 2021-08-18: 1000 mL

## 2021-08-18 MED ORDER — ONDANSETRON HCL 4 MG/2ML IJ SOLN: 4.0000 mg | Freq: Once | INTRAMUSCULAR | Status: DC | PRN

## 2021-08-18 MED ORDER — OXYCODONE HCL 5 MG PO TABS: 5.0000 mg | ORAL_TABLET | Freq: Once | ORAL | Status: DC | PRN

## 2021-08-18 MED ORDER — LIDOCAINE HCL (PF) 1 % IJ SOLN
INTRAMUSCULAR | Status: AC
  Filled 2021-08-18: qty 30

## 2021-08-18 MED ORDER — CEFAZOLIN SODIUM-DEXTROSE 2-3 GM-%(50ML) IV SOLR
INTRAVENOUS | Status: DC | PRN
  Administered 2021-08-18: 2 g via INTRAVENOUS

## 2021-08-18 MED ORDER — HEPARIN (PORCINE) IN NACL 1000-0.9 UT/500ML-% IV SOLN
INTRAVENOUS | Status: AC
  Filled 2021-08-18: qty 1000

## 2021-08-18 MED ORDER — HYDRALAZINE HCL 20 MG/ML IJ SOLN: 5.0000 mg | INTRAMUSCULAR | Status: DC | PRN

## 2021-08-18 MED ORDER — LIDOCAINE-EPINEPHRINE (PF) 1.5 %-1:200000 IJ SOLN
INTRAMUSCULAR | Status: DC | PRN
  Administered 2021-08-18: 10 mL via PERINEURAL

## 2021-08-18 MED ORDER — FENTANYL CITRATE (PF) 250 MCG/5ML IJ SOLN
INTRAMUSCULAR | Status: AC
  Filled 2021-08-18: qty 5

## 2021-08-18 MED ORDER — MIDAZOLAM HCL 2 MG/2ML IJ SOLN
INTRAMUSCULAR | Status: DC | PRN
  Administered 2021-08-18: 1 mg via INTRAVENOUS

## 2021-08-18 MED ORDER — HEPARIN (PORCINE) IN NACL 1000-0.9 UT/500ML-% IV SOLN
INTRAVENOUS | Status: DC | PRN
  Administered 2021-08-18 (×2): 500 mL

## 2021-08-18 MED ORDER — 0.9 % SODIUM CHLORIDE (POUR BTL) OPTIME
TOPICAL | Status: DC | PRN
  Administered 2021-08-18: 1000 mL

## 2021-08-18 MED ORDER — LIDOCAINE HCL (PF) 1 % IJ SOLN
INTRAMUSCULAR | Status: DC | PRN
  Administered 2021-08-18: 12 mL

## 2021-08-18 SURGICAL SUPPLY — 34 items
ARMBAND PINK RESTRICT EXTREMIT (MISCELLANEOUS) ×2 IMPLANT
BAG COUNTER SPONGE SURGICOUNT (BAG) ×2 IMPLANT
CANISTER SUCT 3000ML PPV (MISCELLANEOUS) ×2 IMPLANT
COVER PROBE W GEL 5X96 (DRAPES) ×1 IMPLANT
DERMABOND ADVANCED (GAUZE/BANDAGES/DRESSINGS) ×1
DERMABOND ADVANCED .7 DNX12 (GAUZE/BANDAGES/DRESSINGS) ×1 IMPLANT
ELECT REM PT RETURN 9FT ADLT (ELECTROSURGICAL) ×2
ELECTRODE REM PT RTRN 9FT ADLT (ELECTROSURGICAL) ×1 IMPLANT
GAUZE SPONGE 4X4 12PLY STRL (GAUZE/BANDAGES/DRESSINGS) ×1 IMPLANT
GLOVE BIO SURGEON STRL SZ7.5 (GLOVE) ×2 IMPLANT
GLOVE BIOGEL PI IND STRL 8 (GLOVE) ×1 IMPLANT
GLOVE BIOGEL PI INDICATOR 8 (GLOVE) ×1
GLOVE SRG 8 PF TXTR STRL LF DI (GLOVE) ×1 IMPLANT
GLOVE SURG POLYISO LF SZ8 (GLOVE) IMPLANT
GLOVE SURG UNDER POLY LF SZ8 (GLOVE) ×2
GOWN STRL REUS W/ TWL LRG LVL3 (GOWN DISPOSABLE) ×2 IMPLANT
GOWN STRL REUS W/TWL 2XL LVL3 (GOWN DISPOSABLE) ×4 IMPLANT
GOWN STRL REUS W/TWL LRG LVL3 (GOWN DISPOSABLE) ×4
KIT BASIN OR (CUSTOM PROCEDURE TRAY) ×2 IMPLANT
KIT TURNOVER KIT B (KITS) ×2 IMPLANT
NS IRRIG 1000ML POUR BTL (IV SOLUTION) ×2 IMPLANT
PACK CV ACCESS (CUSTOM PROCEDURE TRAY) ×2 IMPLANT
PAD ARMBOARD 7.5X6 YLW CONV (MISCELLANEOUS) ×4 IMPLANT
SLING ARM FOAM STRAP MED (SOFTGOODS) ×1 IMPLANT
SUT MNCRL AB 4-0 PS2 18 (SUTURE) ×3 IMPLANT
SUT PROLENE 6 0 BV (SUTURE) ×1 IMPLANT
SUT SILK 0 TIES 10X30 (SUTURE) ×1 IMPLANT
SUT SILK 3 0 SH CR/8 (SUTURE) ×1 IMPLANT
SUT VIC AB 3-0 SH 27 (SUTURE) ×2
SUT VIC AB 3-0 SH 27X BRD (SUTURE) ×1 IMPLANT
SYR BULB IRRIG 60ML STRL (SYRINGE) ×1 IMPLANT
TOWEL GREEN STERILE (TOWEL DISPOSABLE) ×2 IMPLANT
UNDERPAD 30X36 HEAVY ABSORB (UNDERPADS AND DIAPERS) ×2 IMPLANT
WATER STERILE IRR 1000ML POUR (IV SOLUTION) ×2 IMPLANT

## 2021-08-18 SURGICAL SUPPLY — 13 items
CATH ANGIO 5F BER2 100CM (CATHETERS) ×3
CATH ANGIO 5F BER2 65CM (CATHETERS) ×3
CATH ANGIO 5F PIGTAIL 100CM (CATHETERS) ×3
DEVICE VASC CLSR CELT ART 5 (Vascular Products) ×3 IMPLANT
KIT MICROPUNCTURE NIT STIFF (SHEATH) ×3
KIT PV (KITS) ×3
SHEATH PINNACLE 5F 10CM (SHEATH) ×3
SHEATH PROBE COVER 6X72 (BAG) ×3
SYR MEDRAD MARK V 150ML (SYRINGE) ×3
TRANSDUCER W/STOPCOCK (MISCELLANEOUS) ×3
TRAY PV CATH (CUSTOM PROCEDURE TRAY) ×3
WIRE BENTSON .035X145CM (WIRE) ×3
WIRE HI TORQ VERSACORE 300 (WIRE) ×3

## 2021-08-18 NOTE — Op Note (Signed)
    Patient name: [name] MRN: [id number] DOB: 12-13-1960 Sex: male  08/18/2021 Pre-operative Diagnosis: Bilateral upper extremity ulcers Post-operative diagnosis:  Same Surgeon:  [name] Procedure Performed:  1.  Ultrasound-guided access, right femoral artery  2.  Aortic arch angiogram   3.  Third order catheterization and right upper extremity angiogram  4.  Second-order catheterization and left upper extremity angiogram  5.  Conscious sedation, 48 minutes  6.  Closure device, Celt    Indications: This is a 61 year old gentleman with end-stage renal disease who has bilateral upper extremity ulcers.  He earlier today underwent ligation of his left brachiobasilic fistula and is here today for angiographic evaluation of the upper extremity vasculature  Procedure:  The patient was identified in the holding area and taken to room 8.  The patient was then placed supine on the table and prepped and draped in the usual sterile fashion.  A time out was called.  Conscious sedation was administered with the use of IV fentanyl and Versed under continuous physician and nurse monitoring.  Heart rate, blood pressure, and oxygen saturation were continuously monitored.  Total sedation time was 48 minutes.  Ultrasound was used to evaluate the right common femoral artery.  It was patent .  A digital ultrasound image was acquired.  A micropuncture needle was used to access the right common femoral artery under ultrasound guidance.  An 018 wire was advanced without resistance and a micropuncture sheath was placed.  The 018 wire was removed and a benson wire was placed.  The micropuncture sheath was exchanged for a 5 french sheath.  A pigtail catheter was advanced over the wire into the ascending aorta and an aortic arch angiogram was performed.  Next, using a Berenstein 2 catheter and a Bentson wire, the catheter was placed into the brachial artery bilaterally and upper extremity angiography was  performed.  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  No significant ostial great vessel disease was identified.  The bilateral subclavian and axillary arteries are widely patent as are bilateral common carotid arteries  Right upper extremity: The right subclavian, axillary, and brachial arteries are widely patent.  Below the elbow, the vessels get very small in caliber.  There does appear to be a radial artery that contributes to a palmar arch, however visualization is limited.  There does appear to be opacification of the digital arteries.  No obvious correctable vascular lesions are identified  Left upper r Extremity: Left subclavian, axillary, and brachial arteries are widely patent.  There is remnant of the previously ligated fistula in the upper arm.  There is diminutive size of the vessels below the elbow.  The palmar arch does appear to be intact with digital artery blood flow  Intervention: Groin was closed with a Celt without complications  Impression:  #1  Bovine aortic arch without any significant ostial great vessel stenosis  #2  No significant stenosis identified in either extremity down to the elbow.  At this point all vessels become very small in caliber however do appear to opacify across the wrist.  There are no correctable vascular lesions    V. [name], M.D., [facility] [place] Office: [phone] Pager:  [phone]

## 2021-08-18 NOTE — Progress Notes (Signed)
[facility] Progress Note   Subjective:  Seen and examined in cath lab. Still groggy. Underwent fistula ligation and bilat UE angiogram per VSS.    Objective Vitals:   08/18/21 1204 08/18/21 1226 08/18/21 1227 08/18/21 1300  BP: (!) 149/108 (!) 163/69 (!) 157/77 (!) 173/70  Pulse: 73  73 77  Resp: '19 18 18 13  '$ Temp:      TempSrc:      SpO2: 100% 100% 93% 100%  Weight:      Height:       Physical Exam General: Alert male in NAD Heart: RRR, no murmurs, rubs or gallops Lungs: CTA bilaterally without wheezing, rhonchi or rales Abdomen: Soft, non-distended, +BS Extremities: L AKA with wound vac, no edema b/l lower extremities Dialysis Access: TDC, LUE AVF + bruit  Additional Objective Labs: Basic Metabolic Panel: Recent Labs  Lab 08/16/21 0104 08/17/21 0322 08/18/21 0224  NA 136 133* 135  K 3.5 3.4* 3.2*  CL 97* 94* 98  CO2 '30 30 31  '$ GLUCOSE 194* 201* 173*  BUN 30* 41* 16  CREATININE 2.91* 3.28* 1.80*  CALCIUM 7.9* 7.9* 7.9*  PHOS  --   --  1.6*    Liver Function Tests: Recent Labs  Lab 08/18/21 0224  ALBUMIN 2.2*    No results for input(s): "LIPASE", "AMYLASE" in the last 168 hours. CBC: Recent Labs  Lab 08/12/21 0210 08/12/21 1231 08/13/21 0719 08/14/21 0139 08/16/21 0104 08/17/21 0322  WBC 12.2*  --  11.0* 11.2* 11.1* 8.7  NEUTROABS 10.5*  --   --   --   --   --   HGB 7.9*   < > 9.2* 9.4* 9.5* 10.2*  HCT 24.0*   < > 28.7* 29.1* 29.2* 31.1*  MCV 86.3  --  87.8 87.4 87.4 86.9  PLT 217  --  245 258 277 304   < > = values in this interval not displayed.    Blood Culture    Component Value Date/Time   SDES BLOOD SITE NOT SPECIFIED 08/10/2021 1416   SDES BLOOD SITE NOT SPECIFIED 08/10/2021 1416   SPECREQUEST  08/10/2021 1416    BOTTLES DRAWN AEROBIC ONLY Blood Culture adequate volume   SPECREQUEST  08/10/2021 1416    BOTTLES DRAWN AEROBIC ONLY Blood Culture adequate volume   CULT  08/10/2021 1416    NO GROWTH 5 DAYS Performed  at [facility], [place] [address], [place], [place] [address]    CULT  08/10/2021 1416    NO GROWTH 5 DAYS Performed at [facility], [place] [address], [place], [place] [address]    REPTSTATUS 08/15/2021 FINAL 08/10/2021 1416   REPTSTATUS 08/15/2021 FINAL 08/10/2021 1416    Cardiac Enzymes: No results for input(s): "CKTOTAL", "CKMB", "CKMBINDEX", "TROPONINI" in the last 168 hours. CBG: Recent Labs  Lab 08/17/21 2331 08/18/21 0730 08/18/21 0838 08/18/21 0945 08/18/21 1224  GLUCAP 94 174* 181* 175* 154*    Iron Studies: No results for input(s): "IRON", "TIBC", "TRANSFERRIN", "FERRITIN" in the last 72 hours. '@lablastinr3'$ @ Studies/Results: PERIPHERAL VASCULAR CATHETERIZATION  Result Date: 08/18/2021 Images from the original result were not included. Patient name: [name] MRN: [id number] DOB: 14-Feb-1960 Sex: male 08/18/2021 Pre-operative Diagnosis: Bilateral upper extremity ulcers Post-operative diagnosis:  Same Surgeon:  [name] Procedure Performed:  1.  Ultrasound-guided access, right femoral artery  2.  Aortic arch angiogram  3.  Third order catheterization and right upper extremity angiogram  4.  Second-order catheterization and left upper extremity  angiogram  5.  Conscious sedation, 48 minutes  6.  Closure device, Celt Indications: This is a 61 year old gentleman with end-stage renal disease who has bilateral upper extremity ulcers.  He earlier today underwent ligation of his left brachiobasilic fistula and is here today for angiographic evaluation of the upper extremity vasculature Procedure:  The patient was identified in the holding area and taken to room 8.  The patient was then placed supine on the table and prepped and draped in the usual sterile fashion.  A time out was called.  Conscious sedation was administered with the use of IV fentanyl and Versed under continuous physician and nurse monitoring.  Heart rate, blood pressure, and oxygen saturation were  continuously monitored.  Total sedation time was 48 minutes.  Ultrasound was used to evaluate the right common femoral artery.  It was patent .  A digital ultrasound image was acquired.  A micropuncture needle was used to access the right common femoral artery under ultrasound guidance.  An 018 wire was advanced without resistance and a micropuncture sheath was placed.  The 018 wire was removed and a benson wire was placed.  The micropuncture sheath was exchanged for a 5 french sheath.  A pigtail catheter was advanced over the wire into the ascending aorta and an aortic arch angiogram was performed.  Next, using a Berenstein 2 catheter and a Bentson wire, the catheter was placed into the brachial artery bilaterally and upper extremity angiography was performed. 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  No significant ostial great vessel disease was identified.  The bilateral subclavian and axillary arteries are widely patent as are bilateral common carotid arteries  Right upper extremity: The right subclavian, axillary, and brachial arteries are widely patent.  Below the elbow, the vessels get very small in caliber.  There does appear to be a radial artery that contributes to a palmar arch, however visualization is limited.  There does appear to be opacification of the digital arteries.  No obvious correctable vascular lesions are identified  Left upper r Extremity: Left subclavian, axillary, and brachial arteries are widely patent.  There is remnant of the previously ligated fistula in the upper arm.  There is diminutive size of the vessels below the elbow.  The palmar arch does appear to be intact with digital artery blood flow Intervention: Groin was closed with a Celt without complications Impression:  #1  Bovine aortic arch without any significant ostial great vessel stenosis  #2  No significant stenosis identified in either extremity down to the elbow.  At this point all vessels become very  small in caliber however do appear to opacify across the wrist.  There are no correctable vascular lesions  V. [name], M.D., FACS [facility] Office: [phone] Pager:  [phone]   Medications:  sodium chloride 75 mL/hr at 08/18/21 0735   [MAR Hold] anticoagulant sodium citrate      [MAR Hold] (feeding supplement) PROSource Plus  30 mL Oral BID BM   [MAR Hold] acetaminophen  1,000 mg Oral TID   [MAR Hold] amLODipine  5 mg Oral Daily   [MAR Hold] vitamin C  1,000 mg Oral Daily   [MAR Hold] Chlorhexidine Gluconate Cloth  6 each Topical Q0600   [MAR Hold] darbepoetin (ARANESP) injection - DIALYSIS  40 mcg Intravenous Q Wed-HD   [MAR Hold] heparin injection (subcutaneous)  5,000 Units Subcutaneous Q8H   [MAR Hold] insulin aspart  0-6 Units Subcutaneous TID  WC   [MAR Hold] melatonin  5 mg Oral QHS   [MAR Hold] nutrition supplement (JUVEN)  1 packet Oral BID BM   [MAR Hold] pantoprazole  40 mg Oral Daily   [MAR Hold] senna  1 tablet Oral Daily   [MAR Hold] zinc sulfate  220 mg Oral Daily    Dialysis Orders: [facility] M[facility] Time: 4:00  EDW: 72 kg  Flows: 400/700  [place]: 3K/2.5 Ca  Access: TDC ; L AVF (placed 07/02/21)  Heparin: No bolus heparin ESA: None  VDRA: Hectorol 3 TIW   Assessment/Plan: 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 Per Dr. Sharol Given.  2. ESRD -  HD MWF. Not adhearent to Rx as outpatient. Back on schedule. HD 8/16 3. Hand ischemia - VVS following. s/p ligation of AVF and bilateral UE angiogram on 8/15  --no correctable lesions  4. Hypertension/volume  - BP has been variable, moderately elevated today.  Below his EDW (has likely lost some weight) with no volume excess on exam. Was on amlodipine '10mg'$  daily at home, resumed at lower dose of '5mg'$  daily on 08/15/21  5. Anemia  - Hgb 10.2  Aranesp 40 q Wed. .  6. Metabolic bone disease -  Corr Calcium/phos within goal. Continue home meds. 7. DMT2 -insulin per primary 8. Nutrition - Renal  diet, added prot supps 9. Dispo - For SNF placement     Conley Delisle Larina Earthly PA-C [facility] 08/18/2021,1:11 PM

## 2021-08-18 NOTE — Progress Notes (Signed)
Patient continues to rest comfortably in bed. VSS. Rt groin remains soft with no active bleeding or hematoma noted. Report and care transferred to [facility].

## 2021-08-18 NOTE — Anesthesia Procedure Notes (Signed)
Anesthesia Procedure Image    

## 2021-08-18 NOTE — Op Note (Signed)
    NAME: [name]    MRN: [id number] DOB: July 06, 1960    DATE OF OPERATION: 08/18/2021  PREOP DIAGNOSIS:    Left upper extremity steal syndrome  POSTOP DIAGNOSIS:    Same  PROCEDURE:    Brachiobasilic fistula ligation  SURGEON: Broadus John  ASSIST: Roxy Horseman, PA  ANESTHESIA: Moderate, block  EBL: 5 mL  INDICATIONS:    [name] is a 61 y.o. male with previous history of brachiobasilic fistula creation.  Second stage was never completed.  He presented to the hospital with positive left hand concerning for steal.  After discussing the risks and benefits of fistula ligation, [name] elected to proceed.  FINDINGS:   75m brachiobasilic fistula  TECHNIQUE:   Patient was brought to the OR laid in supine position.  General anesthesia was induced and the patient was prepped and draped in standard fashion.  An ultrasound was used to insonate the brachiobasilic fistula.  The proximal portion was marked.  A longitudinal incision was made roughly 4 cm from the anastomosis.  The fistula was identified, and ligated using 2, 0 silk ties.   The wound bed was irrigated with saline, and closed with a layer of 3-0 Vicryl suture, Monocryl and Dermabond at the level of the skin.   JMacie Burows MD [facility]:   08/18/2021

## 2021-08-18 NOTE — Transfer of Care (Signed)
Immediate Anesthesia Transfer of Care Note  Patient: [name]  Procedure(s) Performed: LEFT ARM ARTERIOVENOUS FISTULA LIGATION (Left: Arm Upper)  Patient Location: PACU  Anesthesia Type:MAC and Regional  Level of Consciousness: awake, alert  and oriented  Airway & Oxygen Therapy: Patient Spontanous Breathing  Post-op Assessment: Report given to RN and Post -op Vital signs reviewed and stable  Post vital signs: Reviewed and stable  Last Vitals:  Vitals Value Taken Time  BP 137/67 08/18/21 0900  Temp 36.9 C 08/18/21 0900  Pulse 76 08/18/21 0901  Resp 17 08/18/21 0901  SpO2 97 % 08/18/21 0901  Vitals shown include unvalidated device data.  Last Pain:  Vitals:   08/18/21 0900  TempSrc:   PainSc: 0-No pain      Patients Stated Pain Goal: 0 (24/82/50 0370)  Complications: No notable events documented.

## 2021-08-18 NOTE — Plan of Care (Signed)
No acute events overnight.    Problem: Education: Goal: Knowledge of General Education information will improve Description: Including pain rating scale, medication(s)/side effects and non-pharmacologic comfort measures Outcome: Progressing   Problem: Health Behavior/Discharge Planning: Goal: Ability to manage health-related needs will improve Outcome: Progressing   Problem: Clinical Measurements: Goal: Ability to maintain clinical measurements within normal limits will improve Outcome: Progressing Goal: Will remain free from infection Outcome: Progressing Goal: Diagnostic test results will improve Outcome: Progressing Goal: Respiratory complications will improve Outcome: Progressing Goal: Cardiovascular complication will be avoided Outcome: Progressing   Problem: Activity: Goal: Risk for activity intolerance will decrease Outcome: Progressing   Problem: Nutrition: Goal: Adequate nutrition will be maintained Outcome: Progressing   Problem: Coping: Goal: Level of anxiety will decrease Outcome: Progressing   Problem: Elimination: Goal: Will not experience complications related to bowel motility Outcome: Progressing Goal: Will not experience complications related to urinary retention Outcome: Progressing   Problem: Pain Managment: Goal: General experience of comfort will improve Outcome: Progressing   Problem: Safety: Goal: Ability to remain free from injury will improve Outcome: Progressing   Problem: Skin Integrity: Goal: Risk for impaired skin integrity will decrease Outcome: Progressing   Problem: Education: Goal: Ability to describe self-care measures that may prevent or decrease complications (Diabetes Survival Skills Education) will improve Outcome: Progressing Goal: Individualized Educational Video(s) Outcome: Progressing   Problem: Coping: Goal: Ability to adjust to condition or change in health will improve Outcome: Progressing   Problem: Fluid  Volume: Goal: Ability to maintain a balanced intake and output will improve Outcome: Progressing   Problem: Health Behavior/Discharge Planning: Goal: Ability to identify and utilize available resources and services will improve Outcome: Progressing Goal: Ability to manage health-related needs will improve Outcome: Progressing   Problem: Metabolic: Goal: Ability to maintain appropriate glucose levels will improve Outcome: Progressing   Problem: Nutritional: Goal: Maintenance of adequate nutrition will improve Outcome: Progressing Goal: Progress toward achieving an optimal weight will improve Outcome: Progressing   Problem: Skin Integrity: Goal: Risk for impaired skin integrity will decrease Outcome: Progressing   Problem: Tissue Perfusion: Goal: Adequacy of tissue perfusion will improve Outcome: Progressing   Problem: Education: Goal: Knowledge of the prescribed therapeutic regimen will improve Outcome: Progressing Goal: Ability to verbalize activity precautions or restrictions will improve Outcome: Progressing Goal: Understanding of discharge needs will improve Outcome: Progressing   Problem: Activity: Goal: Ability to perform//tolerate increased activity and mobilize with assistive devices will improve Outcome: Progressing   Problem: Clinical Measurements: Goal: Postoperative complications will be avoided or minimized Outcome: Progressing   Problem: Self-Care: Goal: Ability to meet self-care needs will improve Outcome: Progressing   Problem: Self-Concept: Goal: Ability to maintain and perform role responsibilities to the fullest extent possible will improve Outcome: Progressing   Problem: Pain Management: Goal: Pain level will decrease with appropriate interventions Outcome: Progressing   Problem: Skin Integrity: Goal: Demonstration of wound healing without infection will improve Outcome: Progressing

## 2021-08-18 NOTE — Anesthesia Procedure Notes (Signed)
Anesthesia Regional Block: Supraclavicular block   Pre-Anesthetic Checklist: , timeout performed,  Correct Patient, Correct Site, Correct Laterality,  Correct Procedure, Correct Position, site marked,  Risks and benefits discussed,  Surgical consent,  Pre-op evaluation,  At surgeon's request and post-op pain management  Laterality: Left  Prep: chloraprep       Needles:  Injection technique: Single-shot  Needle Type: Echogenic Needle     Needle Length: 9cm      Additional Needles:   Procedures:,,,, ultrasound used (permanent image in chart),,    Narrative:  Start time: 08/18/2021 7:00 AM End time: 08/18/2021 7:10 AM Injection made incrementally with aspirations every 5 mL.  Performed by: Personally  Anesthesiologist: Myrtie Soman, MD  Additional Notes: Patient tolerated the procedure well without complications

## 2021-08-18 NOTE — TOC Progression Note (Addendum)
Transition of Care [facility]) - Progression Note    Patient Details  Name: [name] MRN: [id number] Date of Birth: [birth year]  Transition of Care [facility]) CM/SW Contact  Joanne Chars, LCSW Phone Number: 08/18/2021, 10:32 AM  Clinical Narrative:   CSW received call from pt sister [name], discussed bed offers, she would like other options.  CSW reached out to [facility], [facility], [facility], [facility]. Pt not available to discuss bed offers currently.   1040: [facility] cannot accept HD, [facility] cannot accept due to no contract with pt HD center.  1045: [facility] cannot offer bed due to prior HD noncompliance. [name] is reviewing  1300: [facility] asked if pt HD could be switched to TTS rather than MWF.  CSW spoke with [name], who spoke with [facility] and it does sound like they could make this change.  [name] informed--waiting on confirmation that they can offer bed.  CSW updated [name], sister, on this.  Unable to speak with pt and found out he will transfer to cardiac unit--will need to discuss bed offers with him  Expected Discharge Plan: [place] Barriers to Discharge: Continued Medical Work up  Expected Discharge Plan and Services Expected Discharge Plan: [place] In-house Referral: Clinical Social Work Discharge Planning Services: CM Consult Post Acute Care Choice: [place] Living arrangements for the past 2 months: Apartment                                       Social Determinants of Health (SDOH) Interventions    Readmission Risk Interventions     No data to display

## 2021-08-18 NOTE — Interval H&P Note (Signed)
History and Physical Interval Note:  08/18/2021 9:57 AM  [name]  has presented today for surgery, with the diagnosis of grangren of fingers.  The various methods of treatment have been discussed with the patient and family. After consideration of risks, benefits and other options for treatment, the patient has consented to  Procedure(s): Upper Extremity Angiography (N/A) as a surgical intervention.  The patient's history has been reviewed, patient examined, no change in status, stable for surgery.  I have reviewed the patient's chart and labs.  Questions were answered to the patient's satisfaction.     [name]

## 2021-08-18 NOTE — Anesthesia Preprocedure Evaluation (Signed)
Anesthesia Evaluation  Patient identified by MRN, date of birth, ID band Patient awake    Reviewed: Allergy & Precautions, NPO status , Patient's Chart, lab work & pertinent test results  Airway Mallampati: II  TM Distance: >3 FB Neck ROM: Full    Dental no notable dental hx.    Pulmonary neg pulmonary ROS, Current Smoker,    Pulmonary exam normal breath sounds clear to auscultation       Cardiovascular hypertension, + Past MI, + Peripheral Vascular Disease and +CHF   Rhythm:Regular Rate:Normal + Systolic murmurs 1. Left ventricular ejection fraction, by estimation, is 30 to 35%. The  left ventricle has moderately decreased function. The left ventricle  demonstrates global hypokinesis. Left ventricular diastolic parameters are  consistent with Grade II diastolic  dysfunction (pseudonormalization). There is the interventricular septum is  flattened in systole, consistent with right ventricular pressure overload.  2. Right ventricular systolic function is moderately reduced. The right  ventricular size is mildly enlarged. There is normal pulmonary artery  systolic pressure.  3. Left atrial size was mildly dilated.  4. The mitral valve is normal in structure. Mild to moderate mitral valve  regurgitation. No evidence of mitral stenosis.  5. Tricuspid valve regurgitation is moderate.  6. The aortic valve is normal in structure. Aortic valve regurgitation is  not visualized. No aortic stenosis is present.  7. The inferior vena cava is dilated in size with >50% respiratory  variability, suggesting right atrial pressure of 8 mmHg.    Neuro/Psych negative neurological ROS  negative psych ROS   GI/Hepatic Neg liver ROS, GERD  ,  Endo/Other  diabetes  Renal/GU DialysisRenal disease  negative genitourinary   Musculoskeletal negative musculoskeletal ROS (+)   Abdominal   Peds negative pediatric ROS (+)   Hematology  (+) Blood dyscrasia, anemia ,   Anesthesia Other Findings   Reproductive/Obstetrics negative OB ROS                             Anesthesia Physical Anesthesia Plan  ASA: 4  Anesthesia Plan: MAC   Post-op Pain Management: Regional block*   Induction: Intravenous  PONV Risk Score and Plan: 1 and Propofol infusion and Treatment may vary due to age or medical condition  Airway Management Planned: Simple Face Mask  Additional Equipment:   Intra-op Plan:   Post-operative Plan:   Informed Consent: I have reviewed the patients History and Physical, chart, labs and discussed the procedure including the risks, benefits and alternatives for the proposed anesthesia with the patient or authorized representative who has indicated his/her understanding and acceptance.     Dental advisory given  Plan Discussed with: CRNA and Surgeon  Anesthesia Plan Comments:         Anesthesia Quick Evaluation

## 2021-08-18 NOTE — Progress Notes (Addendum)
Patient arrived to cath lab with no complaints at this time. Rt Femoral site soft with no active bleeding or hematoma noted. 4x4 to Rt groin site. Rt popliteal Dopplered. VSS. Patient resting comfortable at this time. Will continue to monitor patient.

## 2021-08-18 NOTE — Progress Notes (Signed)
  Progress Note    08/18/2021 7:21 AM Day of Surgery  Subjective:  no complaints   Vitals:   08/17/21 2329 08/18/21 0443  BP: (!) 132/116 129/68  Pulse: 79 80  Resp: 17 17  Temp: 98 F (36.7 C) 98.3 F (36.8 C)  SpO2: 100%    Physical Exam: Lungs:  non labored Extremities:  finger tips gangrenous; palpable R radial pulse; palpable thrill L arm basilic vein fistula Neurologic: A&O  CBC    Component Value Date/Time   WBC 8.7 08/17/2021 0322   RBC 3.58 (L) 08/17/2021 0322   HGB 10.2 (L) 08/17/2021 0322   HGB 10.2 (L) 07/20/2021 1639   HCT 31.1 (L) 08/17/2021 0322   HCT 31.0 (L) 07/20/2021 1639   PLT 304 08/17/2021 0322   PLT 264 07/20/2021 1639   MCV 86.9 08/17/2021 0322   MCV 87 07/20/2021 1639   MCH 28.5 08/17/2021 0322   MCHC 32.8 08/17/2021 0322   RDW 14.0 08/17/2021 0322   RDW 13.1 07/20/2021 1639   LYMPHSABS 0.9 08/12/2021 0210   LYMPHSABS 1.7 02/03/2021 1634   MONOABS 0.7 08/12/2021 0210   EOSABS 0.0 08/12/2021 0210   EOSABS 0.2 02/03/2021 1634   BASOSABS 0.0 08/12/2021 0210   BASOSABS 0.0 02/03/2021 1634    BMET    Component Value Date/Time   NA 135 08/18/2021 0224   NA 136 03/02/2021 1630   K 3.2 (L) 08/18/2021 0224   CL 98 08/18/2021 0224   CO2 31 08/18/2021 0224   GLUCOSE 173 (H) 08/18/2021 0224   BUN 16 08/18/2021 0224   BUN 33 (H) 03/02/2021 1630   CREATININE 1.80 (H) 08/18/2021 0224   CREATININE 0.85 05/30/2013 1100   CALCIUM 7.9 (L) 08/18/2021 0224   GFRNONAA 42 (L) 08/18/2021 0224   GFRNONAA 70 04/11/2013 0825   GFRAA 33 (L) 08/01/2019 1510   GFRAA 81 04/11/2013 0825    INR    Component Value Date/Time   INR 1.4 (H) 08/11/2021 0757     Intake/Output Summary (Last 24 hours) at 08/18/2021 0721 Last data filed at 08/18/2021 0024 Gross per 24 hour  Intake --  Output 2300 ml  Net -2300 ml      Assessment/Plan:  61 y.o. male with gangrenous fingers of both hands Day of Surgery   Gangrenous changes to fingers have been  worsening Plan will be bilateral upper extremity arteriograms to evaluate circulation.  This will be with Dr. Trula Slade tomorrow 08/18/21. Case was discussed with the patient and he is agreeable to proceed. He will also likely require L arm fistula ligation.    After discussing the risks and benefits of left arm fistula ligation this morning for steal syndrome, [name] elected to proceed.   [name] John MD

## 2021-08-18 NOTE — Anesthesia Procedure Notes (Signed)
Procedure Name: MAC Date/Time: 08/18/2021 7:38 AM  Performed by: Dorthea C[place], CRNAPre-anesthesia Checklist: Patient identified, Emergency Drugs available, Suction available, Patient being monitored and Timeout performed Patient Re-evaluated:Patient Re-evaluated prior to induction Oxygen Delivery Method: Nasal cannula Preoxygenation: Pre-oxygenation with 100% oxygen Induction Type: IV induction Placement Confirmation: positive ETCO2 and CO2 detector

## 2021-08-18 NOTE — Progress Notes (Signed)
OT Cancellation Note  Patient Details Name: [name] MRN: [id number] DOB: 11-29-1960   Cancelled Treatment:    Reason Eval/Treat Not Completed: Patient at procedure or test/ unavailable (Surgery. Will return as schedule allows.)  [place], OTR/L Acute Rehab Office: [phone] 08/18/2021, 7:07 AM

## 2021-08-18 NOTE — Anesthesia Postprocedure Evaluation (Signed)
Anesthesia Post Note  Patient: [name]  Procedure(s) Performed: LEFT ARM ARTERIOVENOUS FISTULA LIGATION (Left: Arm Upper)     Patient location during evaluation: PACU Anesthesia Type: MAC Level of consciousness: awake and alert Pain management: pain level controlled Vital Signs Assessment: post-procedure vital signs reviewed and stable Respiratory status: spontaneous breathing, nonlabored ventilation, respiratory function stable and patient connected to nasal cannula oxygen Cardiovascular status: stable and blood pressure returned to baseline Postop Assessment: no apparent nausea or vomiting Anesthetic complications: no   No notable events documented.  Last Vitals:  Vitals:   08/18/21 1227 08/18/21 1300  BP: (!) 157/77 (!) 173/70  Pulse: 73 77  Resp: 18 13  Temp:    SpO2: 93% 100%    Last Pain:  Vitals:   08/18/21 1226  TempSrc:   PainSc: 0-No pain                 Patrich Heinze S

## 2021-08-18 NOTE — TOC Progression Note (Signed)
Transition of Care [facility]) - Progression Note    Patient Details  Name: [name] MRN: [id number] Date of Birth: [birth year]  Transition of Care [facility]) CM/SW [place], [place] Phone Number: 08/18/2021, 5:23 PM  Clinical Narrative:     CSW spoke with patient at bedside regarding SNF bed offers. Patient request for CSW to follow up with his sister regarding SNF choice. CSW spoke with [name] patients sister who accepted SNF bed offer with [facility] for patient. CSW will follow up with [name] with [facility] tomorrow to confirm SNF bed for patient. Patient is HD. CSW to follow up with renal navigator regarding HD information.CSW will continue to follow and assist with patients dc planning needs.  Expected Discharge Plan: [place] Barriers to Discharge: Continued Medical Work up  Expected Discharge Plan and Services Expected Discharge Plan: [place] In-house Referral: Clinical Social Work Discharge Planning Services: CM Consult Post Acute Care Choice: [place] Living arrangements for the past 2 months: Apartment                                       Social Determinants of Health (SDOH) Interventions    Readmission Risk Interventions     No data to display

## 2021-08-18 NOTE — Consult Note (Signed)
[place] Nurse wound consult note Consultation was completed by review of records, images and assistance from the bedside nurse/clinical staff.  Reason for Consult: RLE wound; patient with BKA RLE per Dr. Sharol Given who has seen patient his admission and is performed a LLE AKA on 08/12/21.  Wound type: healing full thickness RLE; pretibial  Pressure Injury POA: NA Measurement: per nursing flow sheet and observation of images 2.5cm x 2.3cm x 0.2cm  Wound WCH:ENIDPOE, dry yellow along the lateral aspect  Drainage (amount, consistency, odor) none Periwound: intact  Dressing procedure/placement/frequency: silicone foam per the nursing skin care order set. Change every 3 days.  Dr. Sharol Given managing new surgical wound on the LLE with Prevena NPWT dressing Re consult if needed, will not follow at this time. Thanks  Schelly Chuba [employer], RN,CWOCN, CNS, [place] [phone])

## 2021-08-18 NOTE — Progress Notes (Signed)
NAME:  [name], MRN:  [id number], DOB:  March 08, 1960, LOS: 8 ADMISSION DATE:  08/10/2021  Subjective  Patient was evaluated at bedside this AM. He had recently returned from his fistula ligation procedure and was still recovering from sedation. However, he was able to verbalize his understanding of the procedures he was undergoing this morning. Denied any pain or discomfort.   Objective   Blood pressure (!) 167/86, pulse 77, temperature (!) 97.5 F (36.4 C), temperature source Oral, resp. rate 15, height 6' (1.829 m), weight 67.6 kg, SpO2 98 %.     Intake/Output Summary (Last 24 hours) at 08/18/2021 1812 Last data filed at 08/18/2021 1546 Gross per 24 hour  Intake 200 ml  Output 2401 ml  Net -2201 ml   Filed Weights   08/14/21 1736 08/17/21 1844 08/17/21 2257  Weight: 66.3 kg 65.2 kg 67.6 kg   Physical Exam: General: Resting comfortably in no acute distress Pulm: Normal work of breathing on room air.  Abdomen: Soft, non-tender, non-distended. Normoactive bowel sounds MSK:  Wound vac in place on L AKA site, clean margins at amputation site.  No new finger lesions on fingertips. Bilateral distal black lesions on fingertips unchanged.  Neuro: Grossly non-focal  Labs       Latest Ref Rng & Units 08/17/2021    3:22 AM 08/16/2021    1:04 AM 08/14/2021    1:39 AM  CBC  WBC 4.0 - 10.5 K/uL 8.7  11.1  11.2   Hemoglobin 13.0 - 17.0 g/dL 10.2  9.5  9.4   Hematocrit 39.0 - 52.0 % 31.1  29.2  29.1   Platelets 150 - 400 K/uL 304  277  258       Latest Ref Rng & Units 08/18/2021    2:24 AM 08/17/2021    3:22 AM 08/16/2021    1:04 AM  BMP  Glucose 70 - 99 mg/dL 173  201  194   BUN 8 - 23 mg/dL 16  41  30   Creatinine 0.61 - 1.24 mg/dL 1.80  3.28  2.91   Sodium 135 - 145 mmol/L 135  133  136   Potassium 3.5 - 5.1 mmol/L 3.2  3.4  3.5   Chloride 98 - 111 mmol/L 98  94  97   CO2 22 - 32 mmol/L '31  30  30   '$ Calcium 8.9 - 10.3 mg/dL 7.9  7.9  7.9     Summary  [name] is 61yo  person with 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 Today he underwent ligation of his left brachiobasilic fistula and was scheduled for UE angiography shortly after his evaluation.   Assessment & Plan:  Principal Problem:   Infection of amputation stump, left lower extremity ([place]) Active Problems:   ESRD on dialysis [facility])   Atherosclerosis of native arteries of extremities with gangrene, left leg (HCC)   Gangrene of left foot (HCC)   Type 2 diabetes mellitus with diabetic peripheral angiopathy and gangrene, with long-term current use of insulin (HCC)   #Dry gangrene of L foot, POD4 L AKA #Peripheral arterial disease Patient appears well today. Wound vac in place, plan to remove tomorrow 8/16. Per Vascular findings on angiography, there is no significant stenosis of his UE vasculature. There is diminished caliber of the vasculature across the wrists.  Does not appear to have any significant stenosis that could be intervened by vascular. Patient is medically stable for discharge, will plan for SNF once insurance approves and bed is available.  - Wound vac in place, will be removed tomorrow per [facility] recommendation. - Continue Tylenol '1000mg'$  three times daily scheduled - Continue Oxycodone '10mg'$  every 4 hours as needed - Bowel regimen in place - Follow-up vascular surgery as outpatient - Pending SNF on discharge  #ESRD on iHD Patient is currently on his regularly scheduled hemodialysis. Appreciate nephrology's assistance and recommendations. - Dialysis per nephrology - Daily BMP  #Type II diabetes mellitus Sugars have been at goal, no medication changes needed at this time. - Continue sliding scale   Best practice:  DIET: Renal IVF: n/a DVT  PPX: heparin BOWEL: senna CODE: FULL FAM COM: n/a  Christene Slates, PGY-1 Internal Medicine Residency Pager: [phone]  08/18/2021 6:12 PM  If after hours (below), please contact on-call pager: [phone] 5PM-7AM Monday-Friday 1PM-7AM Saturday-Sunday

## 2021-08-18 NOTE — Progress Notes (Signed)
Contacted by CSW to inquire if pt can be changed to a TTS HD schedule at [facility] should that be needed for snf placement. Contacted clinic manager at [place] who states that clinic would likely be able to work something out for a TTS schedule if needed. This information provided to CSW. Will assist as needed.   Melven Sartorius Renal Navigator [phone]

## 2021-08-19 ENCOUNTER — Encounter (HOSPITAL_COMMUNITY): Payer: Self-pay | Admitting: Vascular Surgery

## 2021-08-19 ENCOUNTER — Telehealth: Payer: Self-pay | Admitting: Physician Assistant

## 2021-08-19 LAB — CBC
HCT: 34.9 % — ABNORMAL LOW (ref 39.0–52.0)
Hemoglobin: 11.1 g/dL — ABNORMAL LOW (ref 13.0–17.0)
MCH: 28 pg (ref 26.0–34.0)
MCHC: 31.8 g/dL (ref 30.0–36.0)
MCV: 87.9 fL (ref 80.0–100.0)
Platelets: 303 10*3/uL (ref 150–400)
RBC: 3.97 MIL/uL — ABNORMAL LOW (ref 4.22–5.81)
RDW: 14.2 % (ref 11.5–15.5)
WBC: 7.5 10*3/uL (ref 4.0–10.5)
nRBC: 0 % (ref 0.0–0.2)

## 2021-08-19 LAB — BASIC METABOLIC PANEL
Anion gap: 7 (ref 5–15)
BUN: 31 mg/dL — ABNORMAL HIGH (ref 8–23)
CO2: 29 mmol/L (ref 22–32)
Calcium: 7.9 mg/dL — ABNORMAL LOW (ref 8.9–10.3)
Chloride: 99 mmol/L (ref 98–111)
Creatinine, Ser: 2.6 mg/dL — ABNORMAL HIGH (ref 0.61–1.24)
GFR, Estimated: 27 mL/min — ABNORMAL LOW (ref >60)
Glucose, Bld: 158 mg/dL — ABNORMAL HIGH (ref 70–99)
Potassium: 3.4 mmol/L — ABNORMAL LOW (ref 3.5–5.1)
Sodium: 135 mmol/L (ref 135–145)

## 2021-08-19 LAB — LIPID PANEL
Cholesterol: 77 mg/dL (ref 0–200)
HDL: 31 mg/dL — ABNORMAL LOW (ref >40)
LDL Cholesterol: 38 mg/dL (ref 0–99)
Total CHOL/HDL Ratio: 2.5 RATIO
Triglycerides: 38 mg/dL (ref <150)
VLDL: 8 mg/dL (ref 0–40)

## 2021-08-19 LAB — GLUCOSE, CAPILLARY
Glucose-Capillary: 181 mg/dL — ABNORMAL HIGH (ref 70–99)
Glucose-Capillary: 165 mg/dL — ABNORMAL HIGH (ref 70–99)

## 2021-08-19 MED ORDER — HEPARIN SODIUM (PORCINE) 1000 UNIT/ML DIALYSIS
1000.0000 [IU] | INTRAMUSCULAR | Status: DC | PRN
  Filled 2021-08-19 (×2): qty 1

## 2021-08-19 NOTE — Progress Notes (Signed)
PT IN BED STABLE NO C/OS RDC WNL UFG 2.5L

## 2021-08-19 NOTE — Progress Notes (Signed)
NAME:  [name], MRN:  [id number], DOB:  1960-04-26, LOS: 9 ADMISSION DATE:  08/10/2021  Subjective  Patient evaluated at bedside this AM. He is awake and reports feeling well given the circumstances. Reports that wound vac is causing discomfort. Otherwise, he feels that his procedures went well yesterday, has no present concerns. Patient understands he will be discharged to SNF as soon as a bed is made available. Expressed gratitude for the treatment team.   Objective   Blood pressure (!) 145/72, pulse 79, temperature 98.4 F (36.9 C), temperature source Oral, resp. rate 18, height 6' (1.829 m), weight 67.6 kg, SpO2 100 %.     Intake/Output Summary (Last 24 hours) at 08/19/2021 0652 Last data filed at 08/18/2021 2138 Gross per 24 hour  Intake 920 ml  Output 701 ml  Net 219 ml   Filed Weights   08/14/21 1736 08/17/21 1844 08/17/21 2257  Weight: 66.3 kg 65.2 kg 67.6 kg   Physical Exam: General: Resting comfortably in no acute distress Pulm: Normal work of breathing on room air.  Abdomen: Soft, non-tender, non-distended. Normoactive bowel sounds MSK:  Wound vac in place on L AKA site, clean margins at amputation site.  No new finger lesions on fingertips. Bilateral distal black lesions on fingertips unchanged.  Neuro: Grossly non-focal  Labs       Latest Ref Rng & Units 08/19/2021    4:06 AM 08/17/2021    3:22 AM 08/16/2021    1:04 AM  CBC  WBC 4.0 - 10.5 K/uL 7.5  8.7  11.1   Hemoglobin 13.0 - 17.0 g/dL 11.1  10.2  9.5   Hematocrit 39.0 - 52.0 % 34.9  31.1  29.2   Platelets 150 - 400 K/uL 303  304  277       Latest Ref Rng & Units 08/19/2021    4:06 AM 08/18/2021    2:24 AM 08/17/2021    3:22 AM  BMP  Glucose 70 - 99 mg/dL 158  173  201   BUN 8 - 23 mg/dL 31  16  41   Creatinine 0.61 - 1.24 mg/dL 2.60  1.80  3.28   Sodium 135 - 145 mmol/L 135  135  133   Potassium 3.5 - 5.1 mmol/L 3.4  3.2  3.4   Chloride 98 - 111 mmol/L 99  98  94   CO2 22 - 32 mmol/L '29  31  30    '$ Calcium 8.9 - 10.3 mg/dL 7.9  7.9  7.9     Summary  [name] is 61yo person with 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   Assessment & Plan:  Principal Problem:   Infection of amputation stump, left lower extremity ([place]) Active Problems:   ESRD on dialysis [facility])   Atherosclerosis of native arteries of extremities with gangrene, left leg (HCC)   Gangrene of left foot (HCC)   Type 2 diabetes mellitus with diabetic peripheral angiopathy and gangrene, with long-term current use of insulin (HCC)   #Dry gangrene of L foot, POD4 L AKA #Peripheral arterial disease Patient appears well today. Wound vac in place, plan to remove tomorrow 8/16. Per Vascular findings on angiography, there is no significant stenosis of his UE vasculature. Patient is medically stable for discharge, will plan for SNF once insurance approves and bed  is available.  - Wound vac in place, will be removed today per [facility]os recommendation. - Continue Tylenol '1000mg'$  three times daily scheduled - Continue Oxycodone '10mg'$  every 4 hours as needed - Bowel regimen in place - Pending SNF on discharge  #ESRD on iHD Patient is currently on his regularly scheduled hemodialysis. Appreciate nephrology's assistance and recommendations. - Scheduled Dialysis per nephrology - Daily BMP  #Type II diabetes mellitus Sugars have been at goal, no medication changes needed at this time. - Continue sliding scale   Best practice:  DIET: Renal IVF: n/a DVT PPX: heparin BOWEL: senna CODE: FULL FAM COM: n/a  Christene Slates, PGY-1 Internal Medicine Residency Pager: [phone]  08/19/2021 6:52 AM  If after hours (below), please contact on-call pager: [phone] 5PM-7AM  Monday-Friday 1PM-7AM Saturday-Sunday

## 2021-08-19 NOTE — Progress Notes (Signed)
Received patient in bed to unit.  Alert and oriented.  Informed consent signed and in  chart.   Treatment initiated: 1430 Treatment completed: 1845  Patient tolerated well.  Transported back to the room  alert, without acute distress.  Hand-off given to patient's nurse.   Access used: [facility] Access issues: none  Total UF removed: 2.5L Medication(s) given: Aranesp 26mg Post HD VS: 132/73 80 Post HD weight: 70.3kg   [name]

## 2021-08-19 NOTE — Progress Notes (Addendum)
  Progress Note    08/19/2021 8:37 AM 1 Day Post-Op  Subjective:  no complaints   Vitals:   08/19/21 0002 08/19/21 0449  BP: (!) 145/74 (!) 145/72  Pulse: 79   Resp: 16 18  Temp: 98.4 F (36.9 C) 98.4 F (36.9 C)  SpO2: 100%    Physical Exam: Lungs:  non labored Incisions:  L arm incision c/d/I without hematoma; R groin cath site without hematoma Extremities:  palpable R radial pulse; unable to palpate L radial pulse Neurologic: A&O  CBC    Component Value Date/Time   WBC 7.5 08/19/2021 0406   RBC 3.97 (L) 08/19/2021 0406   HGB 11.1 (L) 08/19/2021 0406   HGB 10.2 (L) 07/20/2021 1639   HCT 34.9 (L) 08/19/2021 0406   HCT 31.0 (L) 07/20/2021 1639   PLT 303 08/19/2021 0406   PLT 264 07/20/2021 1639   MCV 87.9 08/19/2021 0406   MCV 87 07/20/2021 1639   MCH 28.0 08/19/2021 0406   MCHC 31.8 08/19/2021 0406   RDW 14.2 08/19/2021 0406   RDW 13.1 07/20/2021 1639   LYMPHSABS 0.9 08/12/2021 0210   LYMPHSABS 1.7 02/03/2021 1634   MONOABS 0.7 08/12/2021 0210   EOSABS 0.0 08/12/2021 0210   EOSABS 0.2 02/03/2021 1634   BASOSABS 0.0 08/12/2021 0210   BASOSABS 0.0 02/03/2021 1634    BMET    Component Value Date/Time   NA 135 08/19/2021 0406   NA 136 03/02/2021 1630   K 3.4 (L) 08/19/2021 0406   CL 99 08/19/2021 0406   CO2 29 08/19/2021 0406   GLUCOSE 158 (H) 08/19/2021 0406   BUN 31 (H) 08/19/2021 0406   BUN 33 (H) 03/02/2021 1630   CREATININE 2.60 (H) 08/19/2021 0406   CREATININE 0.85 05/30/2013 1100   CALCIUM 7.9 (L) 08/19/2021 0406   GFRNONAA 27 (L) 08/19/2021 0406   GFRNONAA 70 04/11/2013 0825   GFRAA 33 (L) 08/01/2019 1510   GFRAA 81 04/11/2013 0825    INR    Component Value Date/Time   INR 1.4 (H) 08/11/2021 0757     Intake/Output Summary (Last 24 hours) at 08/19/2021 4259 Last data filed at 08/18/2021 2138 Gross per 24 hour  Intake 720 ml  Output 700 ml  Net 20 ml     Assessment/Plan:  61 y.o. male is s/p ligation of L UE fistula; BUE  arteriogram 1 Day Post-Op   BUE arteriogram showing small vessels through the wrists and hands but no correctable hemodynamically significant stenosis L arm incision well appearing Ok for discharge from vascular surgery standpoint; office will arrange follow up   [name] Ligas, PA-C [facility] [phone] 08/19/2021 8:37 AM   I agree with the above.  Stable from vascular surgery perspective, can follow-up in the office.  [name]

## 2021-08-19 NOTE — Progress Notes (Addendum)
Contacted by CSW that pt has been accepted at [facility] snf if pt's out-pt HD can be changed to TTS at [place] clinic manager at [facility] to request a TTS schedule. Awaiting response from clinic manager regarding TTS schedule. Update provided to nephrologist and renal PA.   Melven Sartorius Renal Navigator [phone]

## 2021-08-19 NOTE — Telephone Encounter (Signed)
-----   Message from Dagoberto Ligas, PA-C sent at 08/19/2021  8:43 AM EDT -----  3 week incision check with PA.  PO L arm fistula ligation

## 2021-08-19 NOTE — TOC Progression Note (Addendum)
Transition of Care [facility]) - Progression Note    Patient Details  Name: [name] MRN: [id number] Date of Birth: 10-06-1960  Transition of Care [facility]) CM/SW [place], [place] Phone Number: 08/19/2021, 11:40 AM  Clinical Narrative:     Update-2:52pm- Patients insurance authorization has been approved. Plan Auth ID# [id number] [place] ID# [id number]. Insurance authorization approved from 8/16-8/18.CSW awaiting information from renal navigator for HD TTS schedule for patient .  [name] with [facility] confirmed she can offer SNF bed for patient when medically ready. CSW started insurance authorization for patient reference # [id number]. CSW LVM for renal navigator to discuss patients HD schedule/information while patient is at [facility]. CSW will continue to follow and assist with patients dc planning needs.  Expected Discharge Plan: [place] Barriers to Discharge: Continued Medical Work up  Expected Discharge Plan and Services Expected Discharge Plan: [place] In-house Referral: Clinical Social Work Discharge Planning Services: CM Consult Post Acute Care Choice: [place] Living arrangements for the past 2 months: Apartment                                       Social Determinants of Health (SDOH) Interventions    Readmission Risk Interventions     No data to display

## 2021-08-19 NOTE — Progress Notes (Signed)
[name] Progress Note   Subjective:  Seen in room. No new concerns. Some stump pain this am. Dialysis today.    Objective Vitals:   08/18/21 1543 08/18/21 2003 08/19/21 0002 08/19/21 0449  BP: (!) 167/86 (!) 155/80 (!) 145/74 (!) 145/72  Pulse: 77 79 79   Resp: '15 18 16 18  '$ Temp: (!) 97.5 F (36.4 C) 97.8 F (36.6 C) 98.4 F (36.9 C) 98.4 F (36.9 C)  TempSrc: Oral Oral Oral Oral  SpO2: 98%  100%   Weight:      Height:       Physical Exam General: Alert male in NAD Heart: RRR, no murmurs, rubs or gallops Lungs: CTA bilaterally without wheezing, rhonchi or rales Abdomen: Soft, non-distended, +BS Extremities: L AKA with wound vac, no edema b/l lower extremities Dialysis Access: TDC, LUE AVF + bruit  Additional Objective Labs: Basic Metabolic Panel: Recent Labs  Lab 08/17/21 0322 08/18/21 0224 08/19/21 0406  NA 133* 135 135  K 3.4* 3.2* 3.4*  CL 94* 98 99  CO2 '30 31 29  '$ GLUCOSE 201* 173* 158*  BUN 41* 16 31*  CREATININE 3.28* 1.80* 2.60*  CALCIUM 7.9* 7.9* 7.9*  PHOS  --  1.6*  --     Liver Function Tests: Recent Labs  Lab 08/18/21 0224  ALBUMIN 2.2*    No results for input(s): "LIPASE", "AMYLASE" in the last 168 hours. CBC: Recent Labs  Lab 08/13/21 0719 08/14/21 0139 08/16/21 0104 08/17/21 0322 08/19/21 0406  WBC 11.0* 11.2* 11.1* 8.7 7.5  HGB 9.2* 9.4* 9.5* 10.2* 11.1*  HCT 28.7* 29.1* 29.2* 31.1* 34.9*  MCV 87.8 87.4 87.4 86.9 87.9  PLT 245 258 277 304 303    Blood Culture    Component Value Date/Time   SDES BLOOD SITE NOT SPECIFIED 08/10/2021 1416   SDES BLOOD SITE NOT SPECIFIED 08/10/2021 1416   SPECREQUEST  08/10/2021 1416    BOTTLES DRAWN AEROBIC ONLY Blood Culture adequate volume   SPECREQUEST  08/10/2021 1416    BOTTLES DRAWN AEROBIC ONLY Blood Culture adequate volume   CULT  08/10/2021 1416    NO GROWTH 5 DAYS Performed at [facility], [place] [address] [place], [place] [address]    CULT  08/10/2021  1416    NO GROWTH 5 DAYS Performed at [facility], [place] [address] [place], [place] [address]    REPTSTATUS 08/15/2021 FINAL 08/10/2021 1416   REPTSTATUS 08/15/2021 FINAL 08/10/2021 1416    Cardiac Enzymes: No results for input(s): "CKTOTAL", "CKMB", "CKMBINDEX", "TROPONINI" in the last 168 hours. CBG: Recent Labs  Lab 08/18/21 0945 08/18/21 1224 08/18/21 1653 08/18/21 2124 08/19/21 0747  GLUCAP 175* 154* 148* 140* 181*    Iron Studies: No results for input(s): "IRON", "TIBC", "TRANSFERRIN", "FERRITIN" in the last 72 hours. '@lablastinr3'$ @ Studies/Results: PERIPHERAL VASCULAR CATHETERIZATION  Result Date: 08/18/2021 Images from the original result were not included. Patient name: [name] MRN: [id number] DOB: 04/30/1960 Sex: male 08/18/2021 Pre-operative Diagnosis: Bilateral upper extremity ulcers Post-operative diagnosis:  Same Surgeon:  [name] Procedure Performed:  1.  Ultrasound-guided access, right femoral artery  2.  Aortic arch angiogram  3.  Third order catheterization and right upper extremity angiogram  4.  Second-order catheterization and left upper extremity angiogram  5.  Conscious sedation, 48 minutes  6.  Closure device, Celt Indications: This is a 61 year old gentleman with end-stage renal disease who has bilateral upper extremity ulcers.  He earlier today underwent ligation of his left  brachiobasilic fistula and is here today for angiographic evaluation of the upper extremity vasculature Procedure:  The patient was identified in the holding area and taken to room 8.  The patient was then placed supine on the table and prepped and draped in the usual sterile fashion.  A time out was called.  Conscious sedation was administered with the use of IV fentanyl and Versed under continuous physician and nurse monitoring.  Heart rate, blood pressure, and oxygen saturation were continuously monitored.  Total sedation time was 48 minutes.  Ultrasound was used to evaluate the  right common femoral artery.  It was patent .  A digital ultrasound image was acquired.  A micropuncture needle was used to access the right common femoral artery under ultrasound guidance.  An 018 wire was advanced without resistance and a micropuncture sheath was placed.  The 018 wire was removed and a benson wire was placed.  The micropuncture sheath was exchanged for a 5 french sheath.  A pigtail catheter was advanced over the wire into the ascending aorta and an aortic arch angiogram was performed.  Next, using a Berenstein 2 catheter and a Bentson wire, the catheter was placed into the brachial artery bilaterally and upper extremity angiography was performed. 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  No significant ostial great vessel disease was identified.  The bilateral subclavian and axillary arteries are widely patent as are bilateral common carotid arteries  Right upper extremity: The right subclavian, axillary, and brachial arteries are widely patent.  Below the elbow, the vessels get very small in caliber.  There does appear to be a radial artery that contributes to a palmar arch, however visualization is limited.  There does appear to be opacification of the digital arteries.  No obvious correctable vascular lesions are identified  Left upper r Extremity: Left subclavian, axillary, and brachial arteries are widely patent.  There is remnant of the previously ligated fistula in the upper arm.  There is diminutive size of the vessels below the elbow.  The palmar arch does appear to be intact with digital artery blood flow Intervention: Groin was closed with a Celt without complications Impression:  #1  Bovine aortic arch without any significant ostial great vessel stenosis  #2  No significant stenosis identified in either extremity down to the elbow.  At this point all vessels become very small in caliber however do appear to opacify across the wrist.  There are no correctable vascular  lesions  V. [name], M.D., FACS [facility] Office: [phone] Pager:  [phone]   Medications:  sodium chloride 75 mL/hr at 08/18/21 0735   sodium chloride     anticoagulant sodium citrate      (feeding supplement) PROSource Plus  30 mL Oral BID BM   acetaminophen  1,000 mg Oral TID   amLODipine  5 mg Oral Daily   vitamin C  1,000 mg Oral Daily   Chlorhexidine Gluconate Cloth  6 each Topical Q0600   darbepoetin (ARANESP) injection - DIALYSIS  40 mcg Intravenous Q Wed-HD   heparin injection (subcutaneous)  5,000 Units Subcutaneous Q8H   insulin aspart  0-6 Units Subcutaneous TID WC   melatonin  5 mg Oral QHS   nutrition supplement (JUVEN)  1 packet Oral BID BM   pantoprazole  40 mg Oral Daily   senna  1 tablet Oral Daily   sodium chloride flush  3 mL Intravenous Q12H   zinc sulfate  220 mg Oral Daily    Dialysis Orders: [place] MWF Time: 4:00  EDW: 72 kg  Flows: 400/700  [place]ath: 3K/2.5 Ca  Access: TDC ; L AVF (placed 07/02/21)  Heparin: No bolus heparin ESA: None  VDRA: Hectorol 3 TIW   Assessment/Plan: 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 Per Dr. Sharol Given.  2. ESRD -  HD MWF. Not adhearent to Rx as outpatient. Back on schedule. HD 8/16 3. Hand ischemia - VVS following. s/p ligation of AVF and bilateral UE angiogram on 8/15  --no correctable lesions  4. Hypertension/volume  - BP has been variable, moderately elevated today.  Below his EDW (has likely lost some weight) with no volume excess on exam. Was on amlodipine '10mg'$  daily at home, resumed at lower dose of '5mg'$  daily on 08/15/21  5. Anemia  - Hgb stable. Aranesp 40 q Wed. .  6. Metabolic bone disease -  Corr Calcium/phos within goal. Continue home meds. 7. DMT2 -insulin per primary 8. Nutrition - Renal diet, added prot supps 9. Dispo - For SNF placement     Lidya Mccalister Larina Earthly PA-C [facility] 08/19/2021,8:48 AM

## 2021-08-20 DIAGNOSIS — I120 Hypertensive chronic kidney disease with stage 5 chronic kidney disease or end stage renal disease: Secondary | ICD-10-CM | POA: Diagnosis not present

## 2021-08-20 DIAGNOSIS — N186 End stage renal disease: Secondary | ICD-10-CM | POA: Diagnosis not present

## 2021-08-20 DIAGNOSIS — I96 Gangrene, not elsewhere classified: Secondary | ICD-10-CM | POA: Diagnosis not present

## 2021-08-20 DIAGNOSIS — I70262 Atherosclerosis of native arteries of extremities with gangrene, left leg: Secondary | ICD-10-CM | POA: Diagnosis not present

## 2021-08-20 DIAGNOSIS — Z89511 Acquired absence of right leg below knee: Secondary | ICD-10-CM | POA: Diagnosis not present

## 2021-08-20 DIAGNOSIS — E1152 Type 2 diabetes mellitus with diabetic peripheral angiopathy with gangrene: Secondary | ICD-10-CM | POA: Diagnosis not present

## 2021-08-20 DIAGNOSIS — T8249XD Other complication of vascular dialysis catheter, subsequent encounter: Secondary | ICD-10-CM | POA: Diagnosis not present

## 2021-08-20 DIAGNOSIS — E1129 Type 2 diabetes mellitus with other diabetic kidney complication: Secondary | ICD-10-CM | POA: Diagnosis not present

## 2021-08-20 DIAGNOSIS — T8744 Infection of amputation stump, left lower extremity: Secondary | ICD-10-CM | POA: Diagnosis not present

## 2021-08-20 DIAGNOSIS — M6281 Muscle weakness (generalized): Secondary | ICD-10-CM | POA: Diagnosis not present

## 2021-08-20 DIAGNOSIS — Z794 Long term (current) use of insulin: Secondary | ICD-10-CM | POA: Diagnosis not present

## 2021-08-20 DIAGNOSIS — Z992 Dependence on renal dialysis: Secondary | ICD-10-CM | POA: Diagnosis not present

## 2021-08-20 DIAGNOSIS — D62 Acute posthemorrhagic anemia: Secondary | ICD-10-CM | POA: Diagnosis not present

## 2021-08-20 DIAGNOSIS — K661 Hemoperitoneum: Secondary | ICD-10-CM | POA: Diagnosis not present

## 2021-08-20 DIAGNOSIS — I739 Peripheral vascular disease, unspecified: Secondary | ICD-10-CM | POA: Diagnosis not present

## 2021-08-20 DIAGNOSIS — R531 Weakness: Secondary | ICD-10-CM | POA: Diagnosis not present

## 2021-08-20 DIAGNOSIS — E1169 Type 2 diabetes mellitus with other specified complication: Secondary | ICD-10-CM | POA: Diagnosis not present

## 2021-08-20 DIAGNOSIS — F17210 Nicotine dependence, cigarettes, uncomplicated: Secondary | ICD-10-CM | POA: Diagnosis not present

## 2021-08-20 DIAGNOSIS — E119 Type 2 diabetes mellitus without complications: Secondary | ICD-10-CM | POA: Diagnosis not present

## 2021-08-20 DIAGNOSIS — E1122 Type 2 diabetes mellitus with diabetic chronic kidney disease: Secondary | ICD-10-CM | POA: Diagnosis not present

## 2021-08-20 DIAGNOSIS — R2689 Other abnormalities of gait and mobility: Secondary | ICD-10-CM | POA: Diagnosis not present

## 2021-08-20 DIAGNOSIS — E785 Hyperlipidemia, unspecified: Secondary | ICD-10-CM | POA: Diagnosis not present

## 2021-08-20 DIAGNOSIS — R1311 Dysphagia, oral phase: Secondary | ICD-10-CM | POA: Diagnosis not present

## 2021-08-20 DIAGNOSIS — Z89512 Acquired absence of left leg below knee: Secondary | ICD-10-CM | POA: Diagnosis not present

## 2021-08-20 DIAGNOSIS — Z89612 Acquired absence of left leg above knee: Secondary | ICD-10-CM | POA: Diagnosis not present

## 2021-08-20 DIAGNOSIS — N2581 Secondary hyperparathyroidism of renal origin: Secondary | ICD-10-CM | POA: Diagnosis not present

## 2021-08-20 DIAGNOSIS — Z7401 Bed confinement status: Secondary | ICD-10-CM | POA: Diagnosis not present

## 2021-08-20 LAB — RENAL FUNCTION PANEL
Albumin: 2.4 g/dL — ABNORMAL LOW (ref 3.5–5.0)
Anion gap: 8 (ref 5–15)
BUN: 19 mg/dL (ref 8–23)
CO2: 28 mmol/L (ref 22–32)
Calcium: 8 mg/dL — ABNORMAL LOW (ref 8.9–10.3)
Chloride: 98 mmol/L (ref 98–111)
Creatinine, Ser: 2.16 mg/dL — ABNORMAL HIGH (ref 0.61–1.24)
GFR, Estimated: 34 mL/min — ABNORMAL LOW (ref >60)
Glucose, Bld: 125 mg/dL — ABNORMAL HIGH (ref 70–99)
Phosphorus: 2.3 mg/dL — ABNORMAL LOW (ref 2.5–4.6)
Potassium: 3.6 mmol/L (ref 3.5–5.1)
Sodium: 134 mmol/L — ABNORMAL LOW (ref 135–145)

## 2021-08-20 LAB — GLUCOSE, CAPILLARY
Glucose-Capillary: 127 mg/dL — ABNORMAL HIGH (ref 70–99)
Glucose-Capillary: 177 mg/dL — ABNORMAL HIGH (ref 70–99)
Glucose-Capillary: 165 mg/dL — ABNORMAL HIGH (ref 70–99)

## 2021-08-20 MED ORDER — INSULIN GLARGINE-YFGN 100 UNIT/ML ~~LOC~~ SOPN: 10.0000 [IU] | PEN_INJECTOR | Freq: Every day | SUBCUTANEOUS | 3 refills | Status: DC

## 2021-08-20 MED ORDER — INSULIN ASPART 100 UNIT/ML FLEXPEN: 5.0000 [IU] | PEN_INJECTOR | Freq: Three times a day (TID) | SUBCUTANEOUS | 4 refills | Status: DC

## 2021-08-20 MED ORDER — ACETAMINOPHEN 325 MG PO TABS: 650.0000 mg | ORAL_TABLET | ORAL | Status: DC | PRN

## 2021-08-20 NOTE — TOC Transition Note (Signed)
Transition of Care [facility]) - CM/SW Discharge Note   Patient Details  Name: [name] MRN: [id number] Date of Birth: 02/11/1960  [facility] CM/SW Contact:  Milas Gain, [place] Phone Number: 08/20/2021, 3:13 PM   Clinical Narrative:     Patient will DC to: [facility]  Anticipated DC date: 08/20/2021  Family notified: Mary   Transport by: Corey Harold  ?  Per MD patient ready for DC to [facility] . RN, patient, patient's family,Tr[facility] Renal Navigator, and facility notified of DC. Discharge Summary sent to facility. RN given number for report tele# [phone] RM# [phone]. DC packet on chart. Ambulance transport requested for patient.  CSW signing off.   Final next level of care: Skilled Nursing Facility Barriers to Discharge: No Barriers Identified   Patient Goals and CMS Choice Patient states their goals for this hospitalization and ongoing recovery are:: SNF CMS Medicare.gov Compare Post Acute Care list provided to:: Patient Choice offered to / list presented to : Patient, Sibling  Discharge Placement              Patient chooses bed at: [facility] Patient to be transferred to facility by: Andrew Name of family member notified: Mary Patient and family notified of of transfer: 08/20/21  Discharge Plan and Services In-house Referral: Clinical Social Work Discharge Planning Services: CM Consult Post Acute Care Choice: [place]                               Social Determinants of Health (SDOH) Interventions     Readmission Risk Interventions    08/20/2021    3:13 PM  Readmission Risk Prevention Plan  Transportation Screening Complete  PCP or Specialist Appt within 3-5 Days Complete  HRI or [place] Complete  Social Work Consult for [place] Planning/Counseling Complete  Palliative Care Screening Not Applicable  Medication Review Press photographer) Complete

## 2021-08-20 NOTE — Progress Notes (Signed)
Physical Therapy Treatment Patient Details Name: [name] MRN: [id number] DOB: 01/01/1961 Today's Date: 08/20/2021   History of Present Illness Pt is a 61 yr old male who presented 08/10/21 due to weakness. Pt found to have dry gangrene of LLE. Pt s/p L AKA on 08/12/21. 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 Pt requires 20+ minutes of PT encouragement to initiate lateral scoot transfer to wheelchair this session, benefiting from PT assist for safety. PT provides education on transfer setup to improve success. Pt  refuses to attempt transfers without donning prosthetic on RLE, which PT recommends against due to wound on lateral aspect of residual limb. Pt will benefit from continued aggressive mobilization in an effort to improve mobility quality and to reduce caregiver burden. PT continues to recommend SNF placement.  Recommendations for follow up therapy are one component of a multi-disciplinary discharge planning process, led by the attending physician.  Recommendations may be updated based on patient status, additional functional criteria and insurance authorization.  Follow Up Recommendations  Skilled nursing-short term rehab (<3 hours/day) Can patient physically be transported by private vehicle: No   Assistance Recommended at Discharge Frequent or constant Supervision/Assistance  Patient can return home with the following Two people to help with walking and/or transfers;Two people to help with bathing/dressing/bathroom;Direct supervision/assist for medications management;Direct supervision/assist for financial management;Assist for transportation;Help with stairs or ramp for entrance   Equipment Recommendations  None recommended by PT    Recommendations for Other Services       Precautions / Restrictions  Precautions Precautions: Fall Restrictions Weight Bearing Restrictions: Yes LLE Weight Bearing: Non weight bearing     Mobility  Bed Mobility Overal bed mobility: Needs Assistance Bed Mobility: Supine to Sit, Sit to Supine     Supine to sit: Supervision Sit to supine: Supervision        Transfers Overall transfer level: Needs assistance Equipment used: 1 person hand held assist Transfers: Bed to chair/wheelchair/BSC            Lateral/Scoot Transfers: Min assist General transfer comment: pt transfers from bed to wheelchair and later back to bed    Ambulation/Gait                   Theme park manager mobility: Yes Wheelchair propulsion: Both upper extremities Wheelchair parts: Needs assistance Distance: 50 (50' x 2 trials) Wheelchair Assistance Details (indicate cue type and reason): assistance with brakes and leg rests  Modified Rankin (Stroke Patients Only)       Balance Overall balance assessment: Needs assistance Sitting-balance support: No upper extremity supported, Feet supported Sitting balance-Leahy Scale: Good                                      Cognition Arousal/Alertness: Awake/alert Behavior During Therapy: Flat affect Overall Cognitive Status: No family/caregiver present to determine baseline cognitive functioning                                 General Comments: singificantly increased time to process, poor attention, difficulty following commands consistently  Exercises      General Comments General comments (skin integrity, edema, etc.): VSS on RA. Pt refusing to participate in transfers without use of prosthetic despite PT education on risks of utilizing prosthetic with presence of RLE wound on lateral lower leg      Pertinent Vitals/Pain Pain Assessment Pain Assessment: Faces Faces Pain Scale: Hurts little more Pain  Location: L residual limb Pain Descriptors / Indicators: Grimacing Pain Intervention(s): Monitored during session    Home Living                          Prior Function            PT Goals (current goals can now be found in the care plan section) Acute Rehab PT Goals Patient Stated Goal: to manage his LLE Progress towards PT goals: Progressing toward goals    Frequency    Min 2X/week      PT Plan Current plan remains appropriate    Co-evaluation              AM-PAC PT "6 Clicks" Mobility   Outcome Measure  Help needed turning from your back to your side while in a flat bed without using bedrails?: A Little Help needed moving from lying on your back to sitting on the side of a flat bed without using bedrails?: A Little Help needed moving to and from a bed to a chair (including a wheelchair)?: A Little Help needed standing up from a chair using your arms (e.g., wheelchair or bedside chair)?: Total Help needed to walk in hospital room?: Total Help needed climbing 3-5 steps with a railing? : Total 6 Click Score: 12    End of Session   Activity Tolerance: Patient tolerated treatment well Patient left: in bed;with call bell/phone within reach Nurse Communication: Mobility status PT Visit Diagnosis: Muscle weakness (generalized) (M62.81);Other abnormalities of gait and mobility (R26.89);Difficulty in walking, not elsewhere classified (R26.2);Pain Pain - Right/Left: Left Pain - part of body: Hip     Time: 0277-4128 PT Time Calculation (min) (ACUTE ONLY): 60 min  Charges:  $Therapeutic Activity: 23-37 mins $Wheel Chair Management: 8-22 mins                     Zenaida Niece, PT, DPT Acute Rehabilitation Office [phone]    Zenaida Niece 08/20/2021, 5:19 PM

## 2021-08-20 NOTE — Progress Notes (Signed)
[facility] Progress Note   Subjective:  Completed dialysis yesterday. UF 2.5L. No new concerns this am. Only c/o feeling cold   Objective Vitals:   08/19/21 1830 08/19/21 1840 08/19/21 2059 08/20/21 0603  BP: (!) 144/79 132/78 (!) 157/78 (!) 156/81  Pulse: 77 80 79 80  Resp: '15 20 18 18  '$ Temp:  98.2 F (36.8 C) 98.7 F (37.1 C) 98 F (36.7 C)  TempSrc:   Oral Oral  SpO2: 100% 99% 99% 97%  Weight:  70.3 kg    Height:       Physical Exam General: Alert male in NAD Heart: RRR, no murmurs, rubs or gallops Lungs: CTA bilaterally without wheezing, rhonchi or rales Abdomen: Soft, non-distended, +BS Extremities: L AKA with wound vac, no edema b/l lower extremities Dialysis Access: TDC, LUE AVF + bruit  Additional Objective Labs: Basic Metabolic Panel: Recent Labs  Lab 08/18/21 0224 08/19/21 0406 08/20/21 0704  NA 135 135 134*  K 3.2* 3.4* 3.6  CL 98 99 98  CO2 '31 29 28  '$ GLUCOSE 173* 158* 125*  BUN 16 31* 19  CREATININE 1.80* 2.60* 2.16*  CALCIUM 7.9* 7.9* 8.0*  PHOS 1.6*  --  2.3*    Liver Function Tests: Recent Labs  Lab 08/18/21 0224 08/20/21 0704  ALBUMIN 2.2* 2.4*    No results for input(s): "LIPASE", "AMYLASE" in the last 168 hours. CBC: Recent Labs  Lab 08/14/21 0139 08/16/21 0104 08/17/21 0322 08/19/21 0406  WBC 11.2* 11.1* 8.7 7.5  HGB 9.4* 9.5* 10.2* 11.1*  HCT 29.1* 29.2* 31.1* 34.9*  MCV 87.4 87.4 86.9 87.9  PLT 258 277 304 303    Blood Culture    Component Value Date/Time   SDES BLOOD SITE NOT SPECIFIED 08/10/2021 1416   SDES BLOOD SITE NOT SPECIFIED 08/10/2021 1416   SPECREQUEST  08/10/2021 1416    BOTTLES DRAWN AEROBIC ONLY Blood Culture adequate volume   SPECREQUEST  08/10/2021 1416    BOTTLES DRAWN AEROBIC ONLY Blood Culture adequate volume   CULT  08/10/2021 1416    NO GROWTH 5 DAYS Performed at [facility], [place] [address], [place], [place] [address]    CULT  08/10/2021 1416    NO GROWTH 5  DAYS Performed at [facility], [place] [address] [place], [place] [address]    REPTSTATUS 08/15/2021 FINAL 08/10/2021 1416   REPTSTATUS 08/15/2021 FINAL 08/10/2021 1416    Cardiac Enzymes: No results for input(s): "CKTOTAL", "CKMB", "CKMBINDEX", "TROPONINI" in the last 168 hours. CBG: Recent Labs  Lab 08/18/21 1653 08/18/21 2124 08/19/21 0747 08/19/21 1156 08/20/21 0735  GLUCAP 148* 140* 181* 165* 127*    Iron Studies: No results for input(s): "IRON", "TIBC", "TRANSFERRIN", "FERRITIN" in the last 72 hours. '@lablastinr3'$ @ Studies/Results: PERIPHERAL VASCULAR CATHETERIZATION  Result Date: 08/18/2021 Images from the original result were not included. Patient name: [name] MRN: [id number] DOB: [birth year] Sex: male 08/18/2021 Pre-operative Diagnosis: Bilateral upper extremity ulcers Post-operative diagnosis:  Same Surgeon:  [name] Procedure Performed:  1.  Ultrasound-guided access, right femoral artery  2.  Aortic arch angiogram  3.  Third order catheterization and right upper extremity angiogram  4.  Second-order catheterization and left upper extremity angiogram  5.  Conscious sedation, 48 minutes  6.  Closure device, Celt Indications: This is a 61 year old gentleman with end-stage renal disease who has bilateral upper extremity ulcers.  He earlier today underwent ligation of his left brachiobasilic fistula and is here today for angiographic evaluation of  the upper extremity vasculature Procedure:  The patient was identified in the holding area and taken to room 8.  The patient was then placed supine on the table and prepped and draped in the usual sterile fashion.  A time out was called.  Conscious sedation was administered with the use of IV fentanyl and Versed under continuous physician and nurse monitoring.  Heart rate, blood pressure, and oxygen saturation were continuously monitored.  Total sedation time was 48 minutes.  Ultrasound was used to evaluate the right common femoral  artery.  It was patent .  A digital ultrasound image was acquired.  A micropuncture needle was used to access the right common femoral artery under ultrasound guidance.  An 018 wire was advanced without resistance and a micropuncture sheath was placed.  The 018 wire was removed and a benson wire was placed.  The micropuncture sheath was exchanged for a 5 french sheath.  A pigtail catheter was advanced over the wire into the ascending aorta and an aortic arch angiogram was performed.  Next, using a Berenstein 2 catheter and a Bentson wire, the catheter was placed into the brachial artery bilaterally and upper extremity angiography was performed. 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  No significant ostial great vessel disease was identified.  The bilateral subclavian and axillary arteries are widely patent as are bilateral common carotid arteries  Right upper extremity: The right subclavian, axillary, and brachial arteries are widely patent.  Below the elbow, the vessels get very small in caliber.  There does appear to be a radial artery that contributes to a palmar arch, however visualization is limited.  There does appear to be opacification of the digital arteries.  No obvious correctable vascular lesions are identified  Left upper r Extremity: Left subclavian, axillary, and brachial arteries are widely patent.  There is remnant of the previously ligated fistula in the upper arm.  There is diminutive size of the vessels below the elbow.  The palmar arch does appear to be intact with digital artery blood flow Intervention: Groin was closed with a Celt without complications Impression:  #1  Bovine aortic arch without any significant ostial great vessel stenosis  #2  No significant stenosis identified in either extremity down to the elbow.  At this point all vessels become very small in caliber however do appear to opacify across the wrist.  There are no correctable vascular lesions  V. [name], M.D., FACS [facility] Office: [phone] Pager:  [phone]   Medications:  sodium chloride 75 mL/hr at 08/20/21 0346   sodium chloride     anticoagulant sodium citrate      (feeding supplement) PROSource Plus  30 mL Oral BID BM   acetaminophen  1,000 mg Oral TID   amLODipine  5 mg Oral Daily   vitamin C  1,000 mg Oral Daily   Chlorhexidine Gluconate Cloth  6 each Topical Q0600   darbepoetin (ARANESP) injection - DIALYSIS  40 mcg Intravenous Q Wed-HD   heparin injection (subcutaneous)  5,000 Units Subcutaneous Q8H   insulin aspart  0-6 Units Subcutaneous TID WC   melatonin  5 mg Oral QHS   nutrition supplement (JUVEN)  1 packet Oral BID BM   pantoprazole  40 mg Oral Daily   senna  1 tablet Oral Daily   sodium chloride flush  3 mL Intravenous Q12H   zinc sulfate  220 mg Oral Daily    Dialysis Orders: [place]  MWF Time: 4:00  EDW: 72 kg  Flows: 400/700  Bath: 3K/2.5 Ca  Access: TDC ; L AVF (placed 07/02/21)  Heparin: No bolus heparin ESA: None  VDRA: Hectorol 3 TIW   Assessment/Plan: 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 Per Dr. Sharol Given.  2. ESRD -  HD MWF. Will need to change to TTS schedule to accommodate SNF. Had HD 8/16. Will do HD  M W Sat this week. Next HD 8/19.  3. Hand ischemia - VVS following. s/p ligation of AVF and bilateral UE angiogram on 8/15  --no correctable lesions  4. Hypertension/volume  - BP has been variable, moderately elevated.  Below his EDW (has likely lost some weight s/p AKA  with no volume excess on exam. Was on amlodipine '10mg'$  daily at home, resumed at lower dose of '5mg'$  daily on 08/15/21  5. Anemia  - Hgb stable. Aranesp 40 q Wed. .  6. Metabolic bone disease -  Corr Calcium/phos within goal. Continue home meds. 7. DMT2 -insulin per primary 8. Nutrition - Renal diet, added prot supps 9. Dispo - For SNF placement     [facility] 08/20/2021,8:46 AM

## 2021-08-20 NOTE — Progress Notes (Signed)
Called Dr. Jess Barters office and spoke to [employer]. Dr. Sharol Given stated patient could be sent to rehab and did not need to be in the hospital today by ortho. Will need a follow up in a week with Dr. Sharol Given. Instructed to remove wound vac and apply a dry sterile dressing to L AKA incision.

## 2021-08-20 NOTE — Progress Notes (Addendum)
Contacted [place] and spoke to [place] pt can be changed to a TTS 11:45 chair time and pt can start this schedule on Saturday. Pt will need to arrive at 11:25 for 11:45 chair time. Pt should come off treatment around 3:45. This information was provided to CSW to provide to snf. Pt is for possible d/c today to snf. Update provided to nephrologist and renal PA as well. Nephrologist confirms it would be ok for pt's next HD treatment to be on Saturday as out-pt.  Melven Sartorius Renal Navigator [phone]  Addendum at 3:39 pm: Pt will d/c to [facility] today. Contacted [place] to advise clinic of pt's d/c today and that pt will resume care on Saturday.

## 2021-08-20 NOTE — Care Management Important Message (Signed)
Important Message  Patient Details  Name: Garner Dullea MRN: [id number] Date of Birth: 1960-08-17   Medicare Important Message Given:  Yes     Shelda Altes 08/20/2021, 10:19 AM

## 2021-08-20 NOTE — Discharge Summary (Signed)
Name: [name] MRN: [id number] DOB: Jun 05, 1960 61 y.o. PCP: [name] Pope, MD  Date of Admission: 08/10/2021  2:34 AM Date of Discharge: 08/20/2021 Attending Physician: [name] Mussel, MD  Discharge Diagnosis: 1. Principal Problem:   Infection of amputation stump, left lower extremity (HCC) Active Problems:   ESRD on dialysis [facility])   Atherosclerosis of native arteries of extremities with gangrene, left leg (HCC)   Gangrene of left foot ([place])   Type 2 diabetes mellitus with diabetic peripheral angiopathy and gangrene, with long-term current use of insulin ([place])   Discharge Medications: Allergies as of 08/20/2021       Reactions   Benicar [olmesartan] Cough        Medication List     STOP taking these medications    BENADRYL ITCH RELIEF STICK 2-0.1 % Stck Generic drug: diphenhydrAMINE-Zinc Acetate   isosorbide mononitrate 30 MG 24 hr tablet Commonly known as: IMDUR   melatonin 3 MG Tabs tablet   nicotine 14 mg/24hr patch Commonly known as: NICODERM CQ - dosed in mg/24 hours       TAKE these medications    Accu-Chek Aviva Plus test strip Generic drug: glucose blood Check blood sugar 3 times a day What changed: Another medication with the same name was removed. Continue taking this medication, and follow the directions you see here.   Accu-Chek Aviva Plus w/Device Kit Check blood sugar 3 times a day   Accu-Chek Guide w/Device Kit Use to check blood sugar 3 (three) times daily.   Accu-Chek FastClix Lancets Misc Check blood sugar 3 (three) times daily. What changed: Another medication with the same name was removed. Continue taking this medication, and follow the directions you see here.   acetaminophen 325 MG tablet Commonly known as: TYLENOL Take 2 tablets (650 mg total) by mouth every 4 (four) hours as needed for mild pain or moderate pain.   amLODipine 10 MG tablet Commonly known as: NORVASC Take 1 tablet (10 mg total) by mouth daily.    aspirin EC 81 MG tablet Take 1 tablet (81 mg total) by mouth daily. Swallow whole.   BD Pen Needle Nano U/F 32G X 4 MM Misc Generic drug: Insulin Pen Needle Use to inject insulin 4 (four) times daily.   blood glucose meter kit and supplies Kit Dispense based on patient and insurance preference. Use up to four times daily as directed. (FOR ICD-9 250.00, 250.01).   carvedilol 6.25 MG tablet Commonly known as: COREG Take 1 tablet (6.25 mg total) by mouth 2 (two) times daily with a meal.   cetirizine 10 MG tablet Commonly known as: ZyrTEC Allergy Take 1 tablet (10 mg total) by mouth daily.   Darbepoetin Alfa 100 MCG/0.5ML Sosy injection Commonly known as: ARANESP Inject 0.5 mLs (100 mcg total) into the vein every Wednesday with hemodialysis.   DULoxetine 30 MG capsule Commonly known as: CYMBALTA Take 2 capsules (60 mg total) by mouth daily.   ezetimibe 10 MG tablet Commonly known as: ZETIA Take 1 tablet (10 mg total) by mouth daily.   fluticasone 50 MCG/ACT nasal spray Commonly known as: FLONASE Place 1 spray into both nostrils daily.   FreeStyle Libre 2 Sensor Misc Place 1 sensor on the skin every 14 days. Use to check glucose continuously   insulin aspart 100 UNIT/ML FlexPen Commonly known as: NOVOLOG Inject 5 Units into the skin 3 (three) times daily with meals. What changed: how much to take   insulin glargine-yfgn 100 UNIT/ML Pen Commonly known as:  SEMGLEE Inject 10 Units into the skin at bedtime. What changed: how much to take   pantoprazole 40 MG tablet Commonly known as: PROTONIX Take 1 tablet by mouth once a day   pregabalin 25 MG capsule Commonly known as: Lyrica Take 1 capsule (25 mg total) by mouth daily.   rosuvastatin 10 MG tablet Commonly known as: CRESTOR Take 1 tablet (10 mg total) by mouth daily.   senna-docusate 8.6-50 MG tablet Commonly known as: Senokot-S Take 1 tablet by mouth at bedtime.               Discharge Care  Instructions  (From admission, onward)           Start     Ordered   08/20/21 0000  Discharge wound care:       Comments:    Foam dressing  Every 3 days     Comments: Silicone foam dressings to the RLE wound change every 3 days. Assess under dressings each shift for any acute changes in the wounds.   08/20/21 1454            Disposition and follow-up:   [name] was discharged from [facility] in Stable condition.  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 Sharol [name]) Bilateral black discoloration of fingertips PAD Underwent L AKA with Dr. Sharol [name], wound healing well. Wound VAC off prior to discharge, no drainage over last few days. Has black-colored fingertips, underwent UE angiography with vascular surgery, no correctable lesions or significant stenosis of UE vasculature and thus no intervention indicated. Also underwent L AVF ligation [name] concern for steal syndrome, has TDC in place for HD. Going to SNF today for continued rehab. Will f/u with Ortho in 1 week. Will f/u with PCP at [facility] in 2 weeks. Will f/u with [facility] in 3 weeks.  ESRD on HD S/p ligation of L AVF [name] concern for steal syndrome (black fingertips). Last HD session 8/16. Had 2 session this week, next session on 08/22/2021. Will now be on TTS schedule for HD in the outpatient setting.  T2DM A1c 8.7% on admission. Patient reporting that he does not take insulin at home despite regimen of semglee 16u qhs and short acting of 11u TID with meals. CBGs during hospitalization well controlled, barely requiring any SSI. Will reduce home insulin regimen until hospital f/u to prevent hypoglycemia. New regimen at discharge of semglee 10u qhs and short acting 5u TID AC.  2.  Labs / imaging needed at time of follow-up: CBC, BMP  3.  Pending labs/ test needing follow-up: none  Follow-up Appointments:  Follow-up Information     [name] Minion, MD Follow up in  1 week(s).   Specialty: Orthopedic Surgery Why: Follow-up appoinment with Dr. Sharol [name] on Tuesday, 08/25/21 at [place] #[phone] Contact information: [place] [address] [phone]         Vascular and [place] Follow up in 3 week(s).   Specialty: Vascular Surgery Contact information: [address] [place] [phone]        [name] Pope, MD Follow up in 2 week(s).   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 Initially started on empiric antibiotics with vanc/cefepime. Underwent L AKA with Dr. Sharol [name] on 8/9 with clean margins and Wound VAC in place. Antibiotics stopped. Wound healing well throughout admission. Wound VAC removed prior to discharge, no drainage over last few days. Also noted to have black discoloration of bilateral fingertips. VVS consulted, underwent UE angiography with vascular surgery, no correctable lesions or significant stenosis of UE vasculature and thus no intervention indicated. Were initially concerned for endocarditis but negative blood cultures and TTE with no vegetations. Also underwent L AVF ligation [name] concern for steal syndrome, has TDC in place for HD. PT/OT recommended SNF. Going to [place] today for continued rehab. Will f/u with Ortho in 1 week. Will f/u with PCP at [facility] in 2 weeks. Will f/u with VVS in 3 weeks.  ESRD on HD S/p ligation of L AVF [name] concern for steal syndrome (black fingertips). Last HD session 8/16. Had 2 session this week, next session on 08/22/2021. Will now be on TTS schedule for HD in the  outpatient setting.  T2DM Diabetic neuropathy A1c 8.7% on admission. Patient reporting that he does not take insulin at home despite regimen of semglee 16u qhs and short acting of 11u TID with meals. CBGs during hospitalization well controlled, barely requiring any SSI. Will reduce home insulin regimen until hospital f/u to prevent hypoglycemia. New regimen at discharge of semglee 10u qhs and short acting 5u TID AC.   Discharge Subjective: Feels good today, asking when he will be able to leave. Has some mild pain at stump site but is improving. He is ready to go home but understands need for SNF for strength training. No questions or concerns at this time. Discussed that Wound Vac will be removed prior to discharge.  Discharge Exam:   BP (!) 156/81 (BP Location: Right Arm)   Pulse 80   Temp 98 F (36.7 C) (Oral)   Resp 18   Ht 6' (1.829 m)   Wt 70.3 kg   SpO2 97%   BMI 21.02 kg/m  Discharge exam:  General: chronically ill-appearing elderly male, laying in bed, NAD. CV: normal rate and regular rhythm Pulm: normal WOB on RA Abdomen: soft, nondistended, nontender, normoactive bowel sounds. MSK: Wound VAC in place on L AKA, clean, dry, no drainage.  Extremities: Bilateral distal black lesions on fingertips, unchanged. Neuro: AAOx3, no focal deficits noted.  Pertinent Labs, Studies, and Procedures:     Latest Ref Rng & Units 08/19/2021    4:06 AM 08/17/2021    3:22 AM 08/16/2021    1:04 AM  CBC  WBC 4.0 - 10.5 K/uL 7.5  8.7  11.1   Hemoglobin 13.0 - 17.0 g/dL 11.1  10.2  9.5   Hematocrit 39.0 - 52.0 % 34.9  31.1  29.2   Platelets 150 - 400 K/uL 303  304  277       Latest Ref Rng & Units 08/20/2021    7:04 AM 08/19/2021    4:06 AM 08/18/2021    2:24 AM  CMP  Glucose 70 - 99 mg/dL 125  158  173   BUN 8 - 23 mg/dL 19  31  16    Creatinine 0.61 - 1.24 mg/dL 2.16  2.60  1.80   Sodium 135 - 145 mmol/L 134  135  135   Potassium 3.5 -  5.1 mmol/L 3.6  3.4  3.2   Chloride 98 - 111  mmol/L 98  99  98   CO2 22 - 32 mmol/L 28  29  31    Calcium 8.9 - 10.3 mg/dL 8.0  7.9  7.9    Lab Results  Component Value Date   HGBA1C 8.7 (H) 08/11/2021   Lipid Panel     Component Value Date/Time   CHOL 77 08/19/2021 0406   CHOL 136 03/02/2021 1630   TRIG 38 08/19/2021 0406   HDL 31 (L) 08/19/2021 0406   HDL 33 (L) 03/02/2021 1630   CHOLHDL 2.5 08/19/2021 0406   VLDL 8 08/19/2021 0406   LDLCALC 38 08/19/2021 0406   LDLCALC 93 03/02/2021 1630   LABVLDL 10 03/02/2021 1630   Blood Culture    Component Value Date/Time   SDES BLOOD SITE NOT SPECIFIED 08/10/2021 1416   SDES BLOOD SITE NOT SPECIFIED 08/10/2021 1416   SPECREQUEST  08/10/2021 1416    BOTTLES DRAWN AEROBIC ONLY Blood Culture adequate volume   SPECREQUEST  08/10/2021 1416    BOTTLES DRAWN AEROBIC ONLY Blood Culture adequate volume   CULT  08/10/2021 1416    NO GROWTH 5 DAYS Performed at [facility], [place] [address], [place], [place] [address]    CULT  08/10/2021 1416    NO GROWTH 5 DAYS Performed at [facility], [place] [address], [place], [place] [address]    REPTSTATUS 08/15/2021 FINAL 08/10/2021 1416   REPTSTATUS 08/15/2021 FINAL 08/10/2021 1416   Lactic Acid, Venous    Component Value Date/Time   LATICACIDVEN 1.8 08/10/2021 1609   Lab Results  Component Value Date   HEPBSAB Reactive (A) 08/11/2021   Lab Results  Component Value Date   HCVAB NON REACTIVE 08/11/2021   Iron/TIBC/Ferritin/ %Sat    Component Value Date/Time   IRON 38 (L) 08/12/2021 0210   TIBC 144 (L) 08/12/2021 0210   FERRITIN 414 (H) 08/12/2021 0210   IRONPCTSAT 26 08/12/2021 0210   DG Foot Complete Left  Result Date: 08/10/2021 CLINICAL DATA:  61 year old male with possible sepsis. Previous foot amputation. EXAM: LEFT FOOT - COMPLETE 3+ VIEW COMPARISON:  Left foot radiograph 06/28/2012. FINDINGS: Radiographic appearance of chronic left foot amputation at the proximal metatarsals, stable/satisfactory when compared  to 2014. Regressed soft tissue swelling since that time. No soft tissue gas. Underlying degenerative changes. No suspicious osteolysis at this time. No acute fracture or dislocation. Some calcified peripheral vascular disease. IMPRESSION: Satisfactory appearance of chronic left foot amputation at the level of the proximal metatarsals. No plain radiographic evidence of osteomyelitis. Electronically Signed   By: Genevie Ann M.D.   On: 08/10/2021 10:46   DG Chest Port 1 View  Result Date: 08/10/2021 CLINICAL DATA:  61 year old male with possible sepsis. EXAM: PORTABLE CHEST 1 VIEW COMPARISON:  Chest radiographs 11/09/2019. FINDINGS: Portable AP upright view at 1030 hours. New right chest dual lumen dialysis type catheter. Mild chronic blunting of the right lung base and costophrenic angle are unchanged. Allowing for portable technique the lungs are clear. No pneumothorax. Mediastinal contours are stable and within normal limits. Visualized tracheal air column is within normal limits. No acute osseous abnormality identified. IMPRESSION: 1.  No acute cardiopulmonary abnormality. 2. New right chest dialysis type catheter since 2021. Electronically Signed   By: Genevie Ann M.D.   On: 08/10/2021 10:43    Upper Extremity Arterial Duplex Right: Monophasic flow is noted in the distal radial artery,  suggesting a lesion in the mid segment.         Monophasic flow is noted in the mid and distal segments of         the ulnar artery, suggesting a lesion in the proximal         segment.  Left: Bidirectional flow is noted in the radial and ulnar arteries,        suggesting steal syndrome in the presence of an ulnar-basilic        HD access.   Upper Extremity Angiography: Findings:             Aortic arch: A bovine aortic arch is identified.  No significant ostial great vessel disease was identified.  The bilateral subclavian and axillary arteries are widely patent as are bilateral common carotid arteries             Right upper extremity: The right subclavian, axillary, and brachial arteries are widely patent.  Below the elbow, the vessels get very small in caliber.  There does appear to be a radial artery that contributes to a palmar arch, however visualization is limited.  There does appear to be opacification of the digital arteries.  No obvious correctable vascular lesions are identified            Left upper r Extremity: Left subclavian, axillary, and brachial arteries are widely patent.  There is remnant of the previously ligated fistula in the upper arm.  There is diminutive size of the vessels below the elbow.  The palmar arch does appear to be intact with digital artery blood flow     Discharge Instructions: Discharge Instructions     Diet - low sodium heart healthy   Complete by: As directed    Discharge wound care:   Complete by: As directed       Foam dressing  Every 3 days     Comments: Silicone foam dressings to the RLE wound change every 3 days. Assess under dressings each shift for any acute changes in the wounds.   Increase activity slowly   Complete by: As directed        Signed: Virl Axe, MD 08/20/2021, 2:54 PM   Pager: [phone]

## 2021-08-20 NOTE — TOC Progression Note (Signed)
Transition of Care [facility]) - Progression Note    Patient Details  Name: [name] MRN: [id number] Date of Birth: [birth year]  Transition of Care [facility]) CM/SW [place], [place] Phone Number: 08/20/2021, 11:29 AM  Clinical Narrative:     CSW was informed by Olivia Mackie renal navigator of patients HD schedule. CSW awaiting callback from [place] with [facility] to provide hd information. CSW will continue to follow.  Expected Discharge Plan: [place] Barriers to Discharge: Continued Medical Work up  Expected Discharge Plan and Services Expected Discharge Plan: [place] In-house Referral: Clinical Social Work Discharge Planning Services: CM Consult Post Acute Care Choice: [place] Living arrangements for the past 2 months: Apartment                                       Social Determinants of Health (SDOH) Interventions    Readmission Risk Interventions     No data to display

## 2021-08-20 NOTE — Progress Notes (Addendum)
Occupational Therapy Treatment Patient Details Name: [name] MRN: [id number] DOB: [birth year] Today's Date: 08/20/2021   History of present illness Pt is a 61 yr old male who presented 08/10/21 due to weakness. Pt found to have dry gangrene of LLE. Pt s/p L AKA on 08/12/21. PMH: ESRD on HD, DM2, CHF, HLD, HLD, R BKA and L foot partial amputation in 2015   OT comments  Pt presented in bed and was orientated x3 but was very distracted to complete tasks and needs increase in time to complete one step activities. It was noted pt had donned silicone sleeve for prothesis and had wound on lateral side of R residual limb and pt was unaware and nursing was made aware. Pt was able to complete bed mobility with min guard to min assist but decline then any further activity at this time. Pt currently with functional limitations due to the deficits listed below (see OT Problem List).  Pt will benefit from skilled OT to increase their safety and independence with ADL and functional mobility for ADL to facilitate discharge to venue listed below.     Recommendations for follow up therapy are one component of a multi-disciplinary discharge planning process, led by the attending physician.  Recommendations may be updated based on patient status, additional functional criteria and insurance authorization.    Follow Up Recommendations  Skilled nursing-short term rehab (<3 hours/day)    Assistance Recommended at Discharge Frequent or constant Supervision/Assistance  Patient can return home with the following  Two people to help with walking and/or transfers;A lot of help with bathing/dressing/bathroom;Assistance with cooking/housework;Assistance with feeding;Direct supervision/assist for medications management;Direct supervision/assist for financial management;Assist for transportation   Equipment Recommendations  None recommended by OT (TBA)    Recommendations for Other Services      Precautions / Restrictions  Precautions Precautions: Fall Precaution Comments: confused Restrictions Weight Bearing Restrictions: Yes LLE Weight Bearing: Non weight bearing Other Position/Activity Restrictions: wound vac       Mobility Bed Mobility Overal bed mobility: Needs Assistance Bed Mobility: Supine to Sit, Sit to Supine Rolling: Min guard   Supine to sit: Min guard Sit to supine: Min guard   General bed mobility comments: increase time to complete and multiple attempts and did not like having help to assist    Transfers                   General transfer comment: pt declined and layed back into bed     Balance Overall balance assessment: Needs assistance Sitting-balance support: Bilateral upper extremity supported, No upper extremity supported Sitting balance-Leahy Scale: Fair                                     ADL either performed or assessed with clinical judgement   ADL Overall ADL's : Needs assistance/impaired Eating/Feeding: Set up;Sitting   Grooming: Oral care;Min guard;Sitting   Upper Body Bathing: Minimal assistance;Cueing for safety;Cueing for sequencing;Sitting   Lower Body Bathing: Maximal assistance;Cueing for safety;Cueing for compensatory techniques;Bed level   Upper Body Dressing : Minimal assistance;Cueing for safety;Cueing for sequencing;Sitting   Lower Body Dressing: Maximal assistance;Cueing for sequencing;Cueing for safety;Sit to/from stand                      Extremity/Trunk Assessment Upper Extremity Assessment Upper Extremity Assessment: RUE deficits/detail;LUE deficits/detail RUE Deficits / Details: decrease in BUE FM corrdination/digital extension  but chronic LUE Deficits / Details: decrease in BUE FM corrdination/digital extension but chronic   Lower Extremity Assessment Lower Extremity Assessment: Defer to PT evaluation        Vision       Perception     Praxis      Cognition Arousal/Alertness:  Awake/alert Behavior During Therapy: Flat affect Overall Cognitive Status: No family/caregiver present to determine baseline cognitive functioning                                 General Comments: Takes increas etime to process, easily distracted with enviroment        Exercises      Shoulder Instructions       General Comments      Pertinent Vitals/ Pain       Pain Assessment Pain Assessment: 0-10 Pain Score: 7  Breathing: normal Negative Vocalization: none Facial Expression: smiling or inexpressive Body Language: relaxed Consolability: no need to console PAINAD Score: 0 Facial Expression: Relaxed, neutral Body Movements: Absence of movements Muscle Tension: Relaxed Pain Location: LLE Pain Descriptors / Indicators: Discomfort, Grimacing, Guarding Pain Intervention(s): Limited activity within patient's tolerance, Monitored during session, Repositioned  Home Living                                          Prior Functioning/Environment              Frequency  Min 2X/week        Progress Toward Goals  OT Goals(current goals can now be found in the care plan section)  Progress towards OT goals: Progressing toward goals  Acute Rehab OT Goals Patient Stated Goal: none reported OT Goal Formulation: With patient Time For Goal Achievement: 08/27/21 Potential to Achieve Goals: Fair ADL Goals Pt Will Perform Grooming: with modified independence;sitting Pt Will Perform Upper Body Bathing: with modified independence;sitting Pt Will Perform Lower Body Bathing: with min assist;sit to/from stand Pt Will Transfer to Toilet: with max assist;bedside commode  Plan Discharge plan remains appropriate    Co-evaluation                 AM-PAC OT "6 Clicks" Daily Activity     Outcome Measure   Help from another person eating meals?: A Little Help from another person taking care of personal grooming?: A Little Help from another  person toileting, which includes using toliet, bedpan, or urinal?: A Lot Help from another person bathing (including washing, rinsing, drying)?: A Lot Help from another person to put on and taking off regular upper body clothing?: A Lot Help from another person to put on and taking off regular lower body clothing?: A Lot 6 Click Score: 14    End of Session Equipment Utilized During Treatment: Gait belt  OT Visit Diagnosis: Other abnormalities of gait and mobility (R26.89);Unsteadiness on feet (R26.81);Muscle weakness (generalized) (M62.81);Pain Pain - Right/Left: Left Pain - part of body: Leg   Activity Tolerance Patient limited by fatigue   Patient Left in bed;with call bell/phone within reach;with bed alarm set;with nursing/sitter in room   Nurse Communication  (wound on RLE residue limb)        Time: 5465-6812 OT Time Calculation (min): 35 min  Charges: OT General Charges $OT Visit: 1 Visit OT Treatments $Self Care/Home Management : 23-37 mins  Joeseph Amor  OTR/L  Acute Rehab Services  [phone] office number [phone] pager number   Joeseph Amor 08/20/2021, 12:40 PM

## 2021-08-20 NOTE — Progress Notes (Signed)
Report to [facility] staff at [phone]. Questions answered. Phone numbers left incase other questions need answers. Patient waiting for [facility] to transfer. Report given to [place].

## 2021-08-21 DIAGNOSIS — E1169 Type 2 diabetes mellitus with other specified complication: Secondary | ICD-10-CM | POA: Diagnosis not present

## 2021-08-21 DIAGNOSIS — E785 Hyperlipidemia, unspecified: Secondary | ICD-10-CM | POA: Diagnosis not present

## 2021-08-21 DIAGNOSIS — I96 Gangrene, not elsewhere classified: Secondary | ICD-10-CM | POA: Diagnosis not present

## 2021-08-21 DIAGNOSIS — N186 End stage renal disease: Secondary | ICD-10-CM | POA: Diagnosis not present

## 2021-08-21 DIAGNOSIS — I739 Peripheral vascular disease, unspecified: Secondary | ICD-10-CM | POA: Diagnosis not present

## 2021-08-21 DIAGNOSIS — Z89512 Acquired absence of left leg below knee: Secondary | ICD-10-CM | POA: Diagnosis not present

## 2021-08-21 DIAGNOSIS — I70262 Atherosclerosis of native arteries of extremities with gangrene, left leg: Secondary | ICD-10-CM | POA: Diagnosis not present

## 2021-08-21 NOTE — Telephone Encounter (Signed)
Appt has been scheduled.

## 2021-08-22 ENCOUNTER — Encounter (HOSPITAL_COMMUNITY): Payer: Self-pay | Admitting: Internal Medicine

## 2021-08-22 DIAGNOSIS — N186 End stage renal disease: Secondary | ICD-10-CM | POA: Diagnosis not present

## 2021-08-22 DIAGNOSIS — N2581 Secondary hyperparathyroidism of renal origin: Secondary | ICD-10-CM | POA: Diagnosis not present

## 2021-08-22 DIAGNOSIS — T8249XD Other complication of vascular dialysis catheter, subsequent encounter: Secondary | ICD-10-CM | POA: Diagnosis not present

## 2021-08-22 DIAGNOSIS — Z992 Dependence on renal dialysis: Secondary | ICD-10-CM | POA: Diagnosis not present

## 2021-08-25 ENCOUNTER — Inpatient Hospital Stay: Payer: Medicare Other | Admitting: Family

## 2021-08-25 ENCOUNTER — Other Ambulatory Visit: Payer: Self-pay | Admitting: *Deleted

## 2021-08-25 DIAGNOSIS — I70262 Atherosclerosis of native arteries of extremities with gangrene, left leg: Secondary | ICD-10-CM | POA: Diagnosis not present

## 2021-08-25 DIAGNOSIS — R2689 Other abnormalities of gait and mobility: Secondary | ICD-10-CM | POA: Diagnosis not present

## 2021-08-25 DIAGNOSIS — R1311 Dysphagia, oral phase: Secondary | ICD-10-CM | POA: Diagnosis not present

## 2021-08-25 NOTE — Patient Outreach (Signed)
[name] resides in [place] [facility]. Screening for [facility] care coordination services as benefit of insurance plan and PCP. [name] admitted to SNF on 08/20/21.  Met with Marcene Corning therapy manager who reports [name] is from home with family. States transition plans are pending progress.   Will continue to follow.   Marthenia Rolling, MSN, RN,BSN [place] Acute Care Coordinator [phone] [facility]) [phone]  (Toll free office)

## 2021-08-26 DIAGNOSIS — Z89512 Acquired absence of left leg below knee: Secondary | ICD-10-CM | POA: Diagnosis not present

## 2021-08-26 DIAGNOSIS — I70262 Atherosclerosis of native arteries of extremities with gangrene, left leg: Secondary | ICD-10-CM | POA: Diagnosis not present

## 2021-08-26 DIAGNOSIS — I739 Peripheral vascular disease, unspecified: Secondary | ICD-10-CM | POA: Diagnosis not present

## 2021-08-26 DIAGNOSIS — E1169 Type 2 diabetes mellitus with other specified complication: Secondary | ICD-10-CM | POA: Diagnosis not present

## 2021-08-26 DIAGNOSIS — N2581 Secondary hyperparathyroidism of renal origin: Secondary | ICD-10-CM | POA: Diagnosis not present

## 2021-08-26 DIAGNOSIS — T8249XD Other complication of vascular dialysis catheter, subsequent encounter: Secondary | ICD-10-CM | POA: Diagnosis not present

## 2021-08-26 DIAGNOSIS — N186 End stage renal disease: Secondary | ICD-10-CM | POA: Diagnosis not present

## 2021-08-26 DIAGNOSIS — E785 Hyperlipidemia, unspecified: Secondary | ICD-10-CM | POA: Diagnosis not present

## 2021-08-26 DIAGNOSIS — I96 Gangrene, not elsewhere classified: Secondary | ICD-10-CM | POA: Diagnosis not present

## 2021-08-26 DIAGNOSIS — Z992 Dependence on renal dialysis: Secondary | ICD-10-CM | POA: Diagnosis not present

## 2021-08-27 ENCOUNTER — Other Ambulatory Visit (HOSPITAL_COMMUNITY): Payer: Self-pay

## 2021-08-27 DIAGNOSIS — T8249XD Other complication of vascular dialysis catheter, subsequent encounter: Secondary | ICD-10-CM | POA: Diagnosis not present

## 2021-08-27 DIAGNOSIS — Z992 Dependence on renal dialysis: Secondary | ICD-10-CM | POA: Diagnosis not present

## 2021-08-27 DIAGNOSIS — N2581 Secondary hyperparathyroidism of renal origin: Secondary | ICD-10-CM | POA: Diagnosis not present

## 2021-08-27 DIAGNOSIS — N186 End stage renal disease: Secondary | ICD-10-CM | POA: Diagnosis not present

## 2021-08-27 MED ORDER — POLYETHYLENE GLYCOL 3350 17 GM/SCOOP PO POWD
17.0000 g | Freq: Every day | ORAL | 0 refills | Status: DC
  Filled 2021-08-27: qty 510, 30d supply, fill #0

## 2021-08-28 ENCOUNTER — Encounter: Payer: Medicare Other | Admitting: Family

## 2021-08-29 DIAGNOSIS — Z992 Dependence on renal dialysis: Secondary | ICD-10-CM | POA: Diagnosis not present

## 2021-08-29 DIAGNOSIS — N2581 Secondary hyperparathyroidism of renal origin: Secondary | ICD-10-CM | POA: Diagnosis not present

## 2021-08-29 DIAGNOSIS — T8249XD Other complication of vascular dialysis catheter, subsequent encounter: Secondary | ICD-10-CM | POA: Diagnosis not present

## 2021-08-29 DIAGNOSIS — N186 End stage renal disease: Secondary | ICD-10-CM | POA: Diagnosis not present

## 2021-09-01 DIAGNOSIS — N186 End stage renal disease: Secondary | ICD-10-CM | POA: Diagnosis not present

## 2021-09-01 DIAGNOSIS — N2581 Secondary hyperparathyroidism of renal origin: Secondary | ICD-10-CM | POA: Diagnosis not present

## 2021-09-01 DIAGNOSIS — Z992 Dependence on renal dialysis: Secondary | ICD-10-CM | POA: Diagnosis not present

## 2021-09-01 DIAGNOSIS — T8249XD Other complication of vascular dialysis catheter, subsequent encounter: Secondary | ICD-10-CM | POA: Diagnosis not present

## 2021-09-02 ENCOUNTER — Encounter: Payer: Medicare Other | Admitting: Family

## 2021-09-02 DIAGNOSIS — E785 Hyperlipidemia, unspecified: Secondary | ICD-10-CM | POA: Diagnosis not present

## 2021-09-02 DIAGNOSIS — I739 Peripheral vascular disease, unspecified: Secondary | ICD-10-CM | POA: Diagnosis not present

## 2021-09-02 DIAGNOSIS — Z89512 Acquired absence of left leg below knee: Secondary | ICD-10-CM | POA: Diagnosis not present

## 2021-09-02 DIAGNOSIS — N186 End stage renal disease: Secondary | ICD-10-CM | POA: Diagnosis not present

## 2021-09-02 DIAGNOSIS — I96 Gangrene, not elsewhere classified: Secondary | ICD-10-CM | POA: Diagnosis not present

## 2021-09-02 DIAGNOSIS — E1169 Type 2 diabetes mellitus with other specified complication: Secondary | ICD-10-CM | POA: Diagnosis not present

## 2021-09-02 DIAGNOSIS — I70262 Atherosclerosis of native arteries of extremities with gangrene, left leg: Secondary | ICD-10-CM | POA: Diagnosis not present

## 2021-09-03 ENCOUNTER — Ambulatory Visit: Payer: Medicare Other

## 2021-09-03 ENCOUNTER — Other Ambulatory Visit: Payer: Self-pay | Admitting: *Deleted

## 2021-09-03 ENCOUNTER — Encounter: Payer: Medicare Other | Admitting: Student

## 2021-09-03 DIAGNOSIS — N186 End stage renal disease: Secondary | ICD-10-CM | POA: Diagnosis not present

## 2021-09-03 DIAGNOSIS — Z992 Dependence on renal dialysis: Secondary | ICD-10-CM | POA: Diagnosis not present

## 2021-09-03 DIAGNOSIS — E1122 Type 2 diabetes mellitus with diabetic chronic kidney disease: Secondary | ICD-10-CM | POA: Diagnosis not present

## 2021-09-03 DIAGNOSIS — N2581 Secondary hyperparathyroidism of renal origin: Secondary | ICD-10-CM | POA: Diagnosis not present

## 2021-09-03 DIAGNOSIS — T8249XD Other complication of vascular dialysis catheter, subsequent encounter: Secondary | ICD-10-CM | POA: Diagnosis not present

## 2021-09-03 NOTE — Patient Outreach (Signed)
THN Post- Acute Care Coordinator follow up.  Facility site visit to [facility]. Met with therapy manager and SNF social workers at [place]. Mr. Kloosterman likely to transition home next with family. States family is working on getting a ramp at the house. Will have home health arranged.   Went to bedside to speak with Mr. Parish. He was not in the room. Left THN literature and writer's contact information on bedside table.   Will plan outreach to family to discuss [facility] services.    Marthenia Rolling, MSN, RN,BSN [place] Acute Care Coordinator [phone] [facility]) [phone]  (Toll free office)

## 2021-09-04 ENCOUNTER — Ambulatory Visit (INDEPENDENT_AMBULATORY_CARE_PROVIDER_SITE_OTHER): Payer: Medicare Other | Admitting: Family

## 2021-09-04 ENCOUNTER — Encounter: Payer: Self-pay | Admitting: Family

## 2021-09-04 DIAGNOSIS — S78112A Complete traumatic amputation at level between left hip and knee, initial encounter: Secondary | ICD-10-CM

## 2021-09-04 DIAGNOSIS — E1169 Type 2 diabetes mellitus with other specified complication: Secondary | ICD-10-CM | POA: Diagnosis not present

## 2021-09-04 DIAGNOSIS — E785 Hyperlipidemia, unspecified: Secondary | ICD-10-CM | POA: Diagnosis not present

## 2021-09-04 DIAGNOSIS — N186 End stage renal disease: Secondary | ICD-10-CM | POA: Diagnosis not present

## 2021-09-04 DIAGNOSIS — I96 Gangrene, not elsewhere classified: Secondary | ICD-10-CM | POA: Diagnosis not present

## 2021-09-04 DIAGNOSIS — I70262 Atherosclerosis of native arteries of extremities with gangrene, left leg: Secondary | ICD-10-CM | POA: Diagnosis not present

## 2021-09-04 DIAGNOSIS — I739 Peripheral vascular disease, unspecified: Secondary | ICD-10-CM | POA: Diagnosis not present

## 2021-09-04 DIAGNOSIS — Z89512 Acquired absence of left leg below knee: Secondary | ICD-10-CM | POA: Diagnosis not present

## 2021-09-04 DIAGNOSIS — Z89511 Acquired absence of right leg below knee: Secondary | ICD-10-CM
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
## 2021-09-05 DIAGNOSIS — N186 End stage renal disease: Secondary | ICD-10-CM | POA: Diagnosis not present

## 2021-09-05 DIAGNOSIS — T8249XD Other complication of vascular dialysis catheter, subsequent encounter: Secondary | ICD-10-CM | POA: Diagnosis not present

## 2021-09-05 DIAGNOSIS — Z992 Dependence on renal dialysis: Secondary | ICD-10-CM | POA: Diagnosis not present

## 2021-09-05 DIAGNOSIS — N2581 Secondary hyperparathyroidism of renal origin: Secondary | ICD-10-CM | POA: Diagnosis not present

## 2021-09-08 DIAGNOSIS — N2581 Secondary hyperparathyroidism of renal origin: Secondary | ICD-10-CM | POA: Diagnosis not present

## 2021-09-08 DIAGNOSIS — Z992 Dependence on renal dialysis: Secondary | ICD-10-CM | POA: Diagnosis not present

## 2021-09-08 DIAGNOSIS — N186 End stage renal disease: Secondary | ICD-10-CM | POA: Diagnosis not present

## 2021-09-08 DIAGNOSIS — T8249XD Other complication of vascular dialysis catheter, subsequent encounter: Secondary | ICD-10-CM | POA: Diagnosis not present

## 2021-09-09 DIAGNOSIS — E785 Hyperlipidemia, unspecified: Secondary | ICD-10-CM | POA: Diagnosis not present

## 2021-09-09 DIAGNOSIS — N186 End stage renal disease: Secondary | ICD-10-CM | POA: Diagnosis not present

## 2021-09-09 DIAGNOSIS — I70262 Atherosclerosis of native arteries of extremities with gangrene, left leg: Secondary | ICD-10-CM | POA: Diagnosis not present

## 2021-09-09 DIAGNOSIS — I96 Gangrene, not elsewhere classified: Secondary | ICD-10-CM | POA: Diagnosis not present

## 2021-09-09 DIAGNOSIS — I739 Peripheral vascular disease, unspecified: Secondary | ICD-10-CM | POA: Diagnosis not present

## 2021-09-09 DIAGNOSIS — Z89512 Acquired absence of left leg below knee: Secondary | ICD-10-CM | POA: Diagnosis not present

## 2021-09-09 DIAGNOSIS — E1169 Type 2 diabetes mellitus with other specified complication: Secondary | ICD-10-CM | POA: Diagnosis not present

## 2021-09-10 ENCOUNTER — Other Ambulatory Visit: Payer: Self-pay | Admitting: *Deleted

## 2021-09-10 ENCOUNTER — Other Ambulatory Visit (HOSPITAL_COMMUNITY): Payer: Self-pay

## 2021-09-10 DIAGNOSIS — D62 Acute posthemorrhagic anemia: Secondary | ICD-10-CM | POA: Diagnosis not present

## 2021-09-10 DIAGNOSIS — N186 End stage renal disease: Secondary | ICD-10-CM | POA: Diagnosis not present

## 2021-09-10 DIAGNOSIS — Z89511 Acquired absence of right leg below knee: Secondary | ICD-10-CM | POA: Diagnosis not present

## 2021-09-10 DIAGNOSIS — N2581 Secondary hyperparathyroidism of renal origin: Secondary | ICD-10-CM | POA: Diagnosis not present

## 2021-09-10 DIAGNOSIS — R2689 Other abnormalities of gait and mobility: Secondary | ICD-10-CM | POA: Diagnosis not present

## 2021-09-10 DIAGNOSIS — I70262 Atherosclerosis of native arteries of extremities with gangrene, left leg: Secondary | ICD-10-CM | POA: Diagnosis not present

## 2021-09-10 DIAGNOSIS — Z992 Dependence on renal dialysis: Secondary | ICD-10-CM | POA: Diagnosis not present

## 2021-09-10 DIAGNOSIS — R1311 Dysphagia, oral phase: Secondary | ICD-10-CM | POA: Diagnosis not present

## 2021-09-10 DIAGNOSIS — K661 Hemoperitoneum: Secondary | ICD-10-CM | POA: Diagnosis not present

## 2021-09-10 DIAGNOSIS — T8249XD Other complication of vascular dialysis catheter, subsequent encounter: Secondary | ICD-10-CM | POA: Diagnosis not present

## 2021-09-10 DIAGNOSIS — M6281 Muscle weakness (generalized): Secondary | ICD-10-CM | POA: Diagnosis not present

## 2021-09-10 MED ORDER — PANTOPRAZOLE SODIUM 40 MG PO TBEC
40.0000 mg | DELAYED_RELEASE_TABLET | Freq: Every day | ORAL | 0 refills | Status: DC
  Filled 2021-09-10: qty 30, 30d supply, fill #0

## 2021-09-10 MED ORDER — ISOSORBIDE MONONITRATE ER 30 MG PO TB24
15.0000 mg | ORAL_TABLET | Freq: Every day | ORAL | 0 refills | Status: DC
  Filled 2021-09-10 (×2): qty 15, 30d supply, fill #0

## 2021-09-10 MED ORDER — MELATONIN 3 MG PO TABS
3.0000 mg | ORAL_TABLET | Freq: Every day | ORAL | 0 refills | Status: DC
  Filled 2022-06-28: qty 60, 60d supply, fill #0

## 2021-09-10 MED ORDER — EZETIMIBE 10 MG PO TABS
10.0000 mg | ORAL_TABLET | Freq: Every day | ORAL | 0 refills | Status: DC
  Filled 2021-09-10: qty 30, 30d supply, fill #0

## 2021-09-10 MED ORDER — ACETAMINOPHEN ER 650 MG PO TBCR: 1300.0000 mg | EXTENDED_RELEASE_TABLET | Freq: Four times a day (QID) | ORAL | 0 refills | Status: DC | PRN

## 2021-09-10 MED ORDER — OXYCODONE HCL 5 MG PO CAPS
5.0000 mg | ORAL_CAPSULE | Freq: Four times a day (QID) | ORAL | 0 refills | Status: DC | PRN
  Filled 2021-09-10: qty 120, 30d supply, fill #0

## 2021-09-10 MED ORDER — FLUTICASONE PROPIONATE 50 MCG/ACT NA SUSP
1.0000 | Freq: Every day | NASAL | 0 refills | Status: DC
  Filled 2021-09-10: qty 16, 60d supply, fill #0

## 2021-09-10 MED ORDER — AMLODIPINE BESYLATE 10 MG PO TABS
10.0000 mg | ORAL_TABLET | Freq: Every day | ORAL | 0 refills | Status: DC
  Filled 2021-09-10: qty 30, 30d supply, fill #0

## 2021-09-10 MED ORDER — OXYCODONE HCL 5 MG PO TABS
5.0000 mg | ORAL_TABLET | Freq: Four times a day (QID) | ORAL | 0 refills | Status: DC | PRN
  Filled 2021-09-10: qty 120, 30d supply, fill #0

## 2021-09-10 MED ORDER — BENADRYL ITCH RELIEF 2-0.1 % EX STCK: CUTANEOUS | 0 refills | Status: DC

## 2021-09-10 MED ORDER — INSULIN GLARGINE 100 UNIT/ML SOLOSTAR PEN
16.0000 [IU] | PEN_INJECTOR | Freq: Every day | SUBCUTANEOUS | 0 refills | Status: DC
  Filled 2021-09-10: qty 3, 17d supply, fill #0

## 2021-09-10 MED ORDER — FREESTYLE LIBRE 2 SENSOR MISC
0 refills | Status: DC
  Filled 2021-09-10: qty 2, 28d supply, fill #0

## 2021-09-10 MED ORDER — ASPIRIN 81 MG PO TBEC
81.0000 mg | DELAYED_RELEASE_TABLET | Freq: Every day | ORAL | 0 refills | Status: DC
  Filled 2021-09-10: qty 30, 30d supply, fill #0

## 2021-09-10 MED ORDER — CARVEDILOL 6.25 MG PO TABS
6.2500 mg | ORAL_TABLET | Freq: Two times a day (BID) | ORAL | 0 refills | Status: DC
  Filled 2021-09-10 – 2022-06-28 (×2): qty 60, 30d supply, fill #0

## 2021-09-10 MED ORDER — ROSUVASTATIN CALCIUM 10 MG PO TABS
10.0000 mg | ORAL_TABLET | Freq: Every day | ORAL | 0 refills | Status: DC
  Filled 2021-09-10: qty 30, 30d supply, fill #0

## 2021-09-10 MED ORDER — CETIRIZINE HCL 10 MG PO TABS
10.0000 mg | ORAL_TABLET | Freq: Every day | ORAL | 0 refills | Status: DC
  Filled 2021-09-10: qty 30, 30d supply, fill #0

## 2021-09-10 MED ORDER — PREGABALIN 25 MG PO CAPS
25.0000 mg | ORAL_CAPSULE | Freq: Every day | ORAL | 0 refills | Status: DC
  Filled 2021-09-10: qty 30, 30d supply, fill #0

## 2021-09-10 MED ORDER — DULOXETINE HCL 60 MG PO CPEP
60.0000 mg | ORAL_CAPSULE | Freq: Every day | ORAL | 0 refills | Status: DC
  Filled 2021-09-10: qty 30, 30d supply, fill #0

## 2021-09-10 MED ORDER — SENNOSIDES-DOCUSATE SODIUM 8.6-50 MG PO TABS: 1.0000 | ORAL_TABLET | Freq: Every day | ORAL | 0 refills | Status: DC

## 2021-09-10 NOTE — Patient Outreach (Signed)
[place] Coordinator follow up. Screening for [facility] care coordination/care management services as benefit of insurance plan and PCP.   Facility site visit to [facility]. Met with SNF social worker and therapy Freight forwarder. Mr. Uy is slated to discharge home on tomorrow 09/11/21. Will have [facility]. Mr. Warmuth is out of the facility at HD. Goes to HD on Tuesdays, Thursdays, Saturdays. SNF SW advises Probation officer to call friend Deloris to discuss [facility] follow up.   Marthenia Rolling, MSN, RN,BSN [place] Acute Care Coordinator [phone] [facility]) [phone]  (Toll free office)

## 2021-09-11 ENCOUNTER — Ambulatory Visit (INDEPENDENT_AMBULATORY_CARE_PROVIDER_SITE_OTHER): Payer: Medicare Other | Admitting: Family

## 2021-09-11 DIAGNOSIS — E785 Hyperlipidemia, unspecified: Secondary | ICD-10-CM | POA: Diagnosis not present

## 2021-09-11 DIAGNOSIS — Z89512 Acquired absence of left leg below knee: Secondary | ICD-10-CM | POA: Diagnosis not present

## 2021-09-11 DIAGNOSIS — I96 Gangrene, not elsewhere classified: Secondary | ICD-10-CM | POA: Diagnosis not present

## 2021-09-11 DIAGNOSIS — E1169 Type 2 diabetes mellitus with other specified complication: Secondary | ICD-10-CM | POA: Diagnosis not present

## 2021-09-11 DIAGNOSIS — S78112A Complete traumatic amputation at level between left hip and knee, initial encounter: Secondary | ICD-10-CM

## 2021-09-11 DIAGNOSIS — N186 End stage renal disease: Secondary | ICD-10-CM | POA: Diagnosis not present

## 2021-09-11 DIAGNOSIS — I739 Peripheral vascular disease, unspecified: Secondary | ICD-10-CM | POA: Diagnosis not present

## 2021-09-11 DIAGNOSIS — I70262 Atherosclerosis of native arteries of extremities with gangrene, left leg: Secondary | ICD-10-CM | POA: Diagnosis not present

## 2021-09-13 DIAGNOSIS — I110 Hypertensive heart disease with heart failure: Secondary | ICD-10-CM | POA: Diagnosis not present

## 2021-09-13 DIAGNOSIS — F17210 Nicotine dependence, cigarettes, uncomplicated: Secondary | ICD-10-CM | POA: Diagnosis not present

## 2021-09-13 DIAGNOSIS — N186 End stage renal disease: Secondary | ICD-10-CM | POA: Diagnosis not present

## 2021-09-13 DIAGNOSIS — Z992 Dependence on renal dialysis: Secondary | ICD-10-CM | POA: Diagnosis not present

## 2021-09-13 DIAGNOSIS — I70222 Atherosclerosis of native arteries of extremities with rest pain, left leg: Secondary | ICD-10-CM | POA: Diagnosis not present

## 2021-09-13 DIAGNOSIS — Z993 Dependence on wheelchair: Secondary | ICD-10-CM | POA: Diagnosis not present

## 2021-09-13 DIAGNOSIS — E1165 Type 2 diabetes mellitus with hyperglycemia: Secondary | ICD-10-CM | POA: Diagnosis not present

## 2021-09-13 DIAGNOSIS — S71111D Laceration without foreign body, right thigh, subsequent encounter: Secondary | ICD-10-CM | POA: Diagnosis not present

## 2021-09-13 DIAGNOSIS — E1151 Type 2 diabetes mellitus with diabetic peripheral angiopathy without gangrene: Secondary | ICD-10-CM | POA: Diagnosis not present

## 2021-09-13 DIAGNOSIS — I509 Heart failure, unspecified: Secondary | ICD-10-CM | POA: Diagnosis not present

## 2021-09-13 DIAGNOSIS — F32A Depression, unspecified: Secondary | ICD-10-CM | POA: Diagnosis not present

## 2021-09-13 DIAGNOSIS — I700 Atherosclerosis of aorta: Secondary | ICD-10-CM | POA: Diagnosis not present

## 2021-09-13 DIAGNOSIS — D62 Acute posthemorrhagic anemia: Secondary | ICD-10-CM | POA: Diagnosis not present

## 2021-09-13 DIAGNOSIS — N3001 Acute cystitis with hematuria: Secondary | ICD-10-CM | POA: Diagnosis not present

## 2021-09-13 DIAGNOSIS — E875 Hyperkalemia: Secondary | ICD-10-CM | POA: Diagnosis not present

## 2021-09-13 DIAGNOSIS — R1311 Dysphagia, oral phase: Secondary | ICD-10-CM | POA: Diagnosis not present

## 2021-09-13 DIAGNOSIS — E1122 Type 2 diabetes mellitus with diabetic chronic kidney disease: Secondary | ICD-10-CM | POA: Diagnosis not present

## 2021-09-13 DIAGNOSIS — K219 Gastro-esophageal reflux disease without esophagitis: Secondary | ICD-10-CM | POA: Diagnosis not present

## 2021-09-13 DIAGNOSIS — Z794 Long term (current) use of insulin: Secondary | ICD-10-CM | POA: Diagnosis not present

## 2021-09-13 DIAGNOSIS — I051 Rheumatic mitral insufficiency: Secondary | ICD-10-CM | POA: Diagnosis not present

## 2021-09-13 DIAGNOSIS — E1140 Type 2 diabetes mellitus with diabetic neuropathy, unspecified: Secondary | ICD-10-CM | POA: Diagnosis not present

## 2021-09-13 DIAGNOSIS — E785 Hyperlipidemia, unspecified: Secondary | ICD-10-CM | POA: Diagnosis not present

## 2021-09-13 DIAGNOSIS — Z7982 Long term (current) use of aspirin: Secondary | ICD-10-CM | POA: Diagnosis not present

## 2021-09-13 DIAGNOSIS — I252 Old myocardial infarction: Secondary | ICD-10-CM | POA: Diagnosis not present

## 2021-09-13 DIAGNOSIS — G3184 Mild cognitive impairment, so stated: Secondary | ICD-10-CM | POA: Diagnosis not present

## 2021-09-14 ENCOUNTER — Other Ambulatory Visit: Payer: Self-pay | Admitting: *Deleted

## 2021-09-14 ENCOUNTER — Ambulatory Visit: Payer: Medicare Other

## 2021-09-14 DIAGNOSIS — N186 End stage renal disease: Secondary | ICD-10-CM

## 2021-09-14 NOTE — Patient Outreach (Addendum)
THN Post- Acute Care Coordinator follow up. Verified in [facility] Mr. Dozier discharged from [facility] on 09/11/21.   Telephone call made to Mr. Lamadrid [phone] to discuss [facility] services. Mr. Rapaport asks Probation officer to call his sister [name] to discuss further. [place] [phone]. Patient identifiers confirmed. [name] states Mr. Toto has a friend of theirs that stays with him and assists with bathing and dressing.   [name] asks Probation officer contact Mr. Karge' children's mother, Deloris to discuss further details. [name] states Deloris makes arrangements for Mr. Thune.  Telephone call made to [facility] [phone]. Deloris reports Mr. Vanderveen' aunt is staying with him and is assisting with bathing, dressing and getting him ready for HD. Mr. Culbreath goes to HD on Tuesdays, Thursdays, and Saturdays. Deloris states transportation has been arranged thru [facility] transportation benefit. Deloris also states she is trying to follow up with the apartment manager about a ramp. Deloris states Mr. Groman has [facility] home health. She also reports PCP appointment is for 09/23/21. States they are in the process to arrange personal care giver assistance thru [place]. States follow up PCP appointment is needed in order to proceed with process.   Deloris states Mr. Kerstein does have Medicaid.   Discussed Probation officer will make referral to [facility] care coordination team. Will make referral to [place] and [place] SW for ramp navigation and follow up on PCS agency process.   Will email writer's contact information and [facility] brochure to Deloris.     Marthenia Rolling, MSN, RN,BSN [place] Acute Care Coordinator [phone] [facility]) [phone]  (Toll free office)

## 2021-09-15 ENCOUNTER — Telehealth: Payer: Self-pay | Admitting: *Deleted

## 2021-09-15 DIAGNOSIS — T8249XD Other complication of vascular dialysis catheter, subsequent encounter: Secondary | ICD-10-CM | POA: Diagnosis not present

## 2021-09-15 DIAGNOSIS — Z992 Dependence on renal dialysis: Secondary | ICD-10-CM | POA: Diagnosis not present

## 2021-09-15 DIAGNOSIS — N2581 Secondary hyperparathyroidism of renal origin: Secondary | ICD-10-CM | POA: Diagnosis not present

## 2021-09-15 DIAGNOSIS — N186 End stage renal disease: Secondary | ICD-10-CM | POA: Diagnosis not present

## 2021-09-15 NOTE — Chronic Care Management (AMB) (Unsigned)
  Care Coordination  Outreach Note  09/15/2021 Name: [name] MRN: [id number] DOB: Jul 02, 1960   Care Coordination Outreach Attempts: An unsuccessful telephone outreach was attempted today to offer the patient information about available care coordination services as a benefit of their health plan.   Follow Up Plan:  Additional outreach attempts will be made to offer the patient care coordination information and services.   Encounter Outcome:  No Answer  [place]  Direct Dial: [phone]

## 2021-09-16 ENCOUNTER — Encounter: Payer: Self-pay | Admitting: Family

## 2021-09-16 DIAGNOSIS — I252 Old myocardial infarction: Secondary | ICD-10-CM | POA: Diagnosis not present

## 2021-09-16 DIAGNOSIS — F17210 Nicotine dependence, cigarettes, uncomplicated: Secondary | ICD-10-CM | POA: Diagnosis not present

## 2021-09-16 DIAGNOSIS — Z992 Dependence on renal dialysis: Secondary | ICD-10-CM | POA: Diagnosis not present

## 2021-09-16 DIAGNOSIS — K219 Gastro-esophageal reflux disease without esophagitis: Secondary | ICD-10-CM | POA: Diagnosis not present

## 2021-09-16 DIAGNOSIS — S71111D Laceration without foreign body, right thigh, subsequent encounter: Secondary | ICD-10-CM | POA: Diagnosis not present

## 2021-09-16 DIAGNOSIS — E1140 Type 2 diabetes mellitus with diabetic neuropathy, unspecified: Secondary | ICD-10-CM | POA: Diagnosis not present

## 2021-09-16 DIAGNOSIS — I70222 Atherosclerosis of native arteries of extremities with rest pain, left leg: Secondary | ICD-10-CM | POA: Diagnosis not present

## 2021-09-16 DIAGNOSIS — D62 Acute posthemorrhagic anemia: Secondary | ICD-10-CM | POA: Diagnosis not present

## 2021-09-16 DIAGNOSIS — R1311 Dysphagia, oral phase: Secondary | ICD-10-CM | POA: Diagnosis not present

## 2021-09-16 DIAGNOSIS — Z993 Dependence on wheelchair: Secondary | ICD-10-CM | POA: Diagnosis not present

## 2021-09-16 DIAGNOSIS — E785 Hyperlipidemia, unspecified: Secondary | ICD-10-CM | POA: Diagnosis not present

## 2021-09-16 DIAGNOSIS — G3184 Mild cognitive impairment, so stated: Secondary | ICD-10-CM | POA: Diagnosis not present

## 2021-09-16 DIAGNOSIS — I509 Heart failure, unspecified: Secondary | ICD-10-CM | POA: Diagnosis not present

## 2021-09-16 DIAGNOSIS — N186 End stage renal disease: Secondary | ICD-10-CM | POA: Diagnosis not present

## 2021-09-16 DIAGNOSIS — Z7982 Long term (current) use of aspirin: Secondary | ICD-10-CM | POA: Diagnosis not present

## 2021-09-16 DIAGNOSIS — I700 Atherosclerosis of aorta: Secondary | ICD-10-CM | POA: Diagnosis not present

## 2021-09-16 DIAGNOSIS — E875 Hyperkalemia: Secondary | ICD-10-CM | POA: Diagnosis not present

## 2021-09-16 DIAGNOSIS — I110 Hypertensive heart disease with heart failure: Secondary | ICD-10-CM | POA: Diagnosis not present

## 2021-09-16 DIAGNOSIS — E1122 Type 2 diabetes mellitus with diabetic chronic kidney disease: Secondary | ICD-10-CM | POA: Diagnosis not present

## 2021-09-16 DIAGNOSIS — I051 Rheumatic mitral insufficiency: Secondary | ICD-10-CM | POA: Diagnosis not present

## 2021-09-16 DIAGNOSIS — E1151 Type 2 diabetes mellitus with diabetic peripheral angiopathy without gangrene: Secondary | ICD-10-CM | POA: Diagnosis not present

## 2021-09-16 DIAGNOSIS — Z794 Long term (current) use of insulin: Secondary | ICD-10-CM | POA: Diagnosis not present

## 2021-09-16 DIAGNOSIS — E1165 Type 2 diabetes mellitus with hyperglycemia: Secondary | ICD-10-CM | POA: Diagnosis not present

## 2021-09-16 DIAGNOSIS — F32A Depression, unspecified: Secondary | ICD-10-CM | POA: Diagnosis not present

## 2021-09-16 DIAGNOSIS — N3001 Acute cystitis with hematuria: Secondary | ICD-10-CM | POA: Diagnosis not present

## 2021-09-16 NOTE — Chronic Care Management (AMB) (Signed)
  Care Coordination   Note   09/16/2021 Name: Justun Anaya MRN: [id number] DOB: 01/18/1960  Vada Yellen is a 61 y.o. year old male who sees [name], MD for primary care. I reached out to [name] by phone today to offer care coordination services.  Mr. Martenson was given information about Care Coordination services today including:   The Care Coordination services include support from the care team which includes your Nurse Coordinator, Clinical Social Worker, or Pharmacist.  The Care Coordination team is here to help remove barriers to the health concerns and goals most important to you. Care Coordination services are voluntary, and the patient may decline or stop services at any time by request to their care team member.   Care Coordination Consent Status: Patient agreed to services and verbal consent obtained.   Follow up plan:  Telephone appointment with care coordination team member scheduled for:  RNCM on 09/17/21 and 09/21/21 with SW  Encounter Outcome:  Pt. Scheduled  [place]  Direct Dial: [phone]
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
## 2021-09-17 ENCOUNTER — Telehealth: Payer: Self-pay | Admitting: *Deleted

## 2021-09-17 ENCOUNTER — Ambulatory Visit: Payer: Self-pay

## 2021-09-17 DIAGNOSIS — I70222 Atherosclerosis of native arteries of extremities with rest pain, left leg: Secondary | ICD-10-CM | POA: Diagnosis not present

## 2021-09-17 DIAGNOSIS — E1151 Type 2 diabetes mellitus with diabetic peripheral angiopathy without gangrene: Secondary | ICD-10-CM | POA: Diagnosis not present

## 2021-09-17 DIAGNOSIS — R1311 Dysphagia, oral phase: Secondary | ICD-10-CM | POA: Diagnosis not present

## 2021-09-17 DIAGNOSIS — F17210 Nicotine dependence, cigarettes, uncomplicated: Secondary | ICD-10-CM | POA: Diagnosis not present

## 2021-09-17 DIAGNOSIS — Z89511 Acquired absence of right leg below knee: Secondary | ICD-10-CM

## 2021-09-17 DIAGNOSIS — Z992 Dependence on renal dialysis: Secondary | ICD-10-CM | POA: Diagnosis not present

## 2021-09-17 DIAGNOSIS — S71111D Laceration without foreign body, right thigh, subsequent encounter: Secondary | ICD-10-CM | POA: Diagnosis not present

## 2021-09-17 DIAGNOSIS — Z794 Long term (current) use of insulin: Secondary | ICD-10-CM | POA: Diagnosis not present

## 2021-09-17 DIAGNOSIS — E1122 Type 2 diabetes mellitus with diabetic chronic kidney disease: Secondary | ICD-10-CM | POA: Diagnosis not present

## 2021-09-17 DIAGNOSIS — I252 Old myocardial infarction: Secondary | ICD-10-CM | POA: Diagnosis not present

## 2021-09-17 DIAGNOSIS — G3184 Mild cognitive impairment, so stated: Secondary | ICD-10-CM | POA: Diagnosis not present

## 2021-09-17 DIAGNOSIS — I110 Hypertensive heart disease with heart failure: Secondary | ICD-10-CM | POA: Diagnosis not present

## 2021-09-17 DIAGNOSIS — E875 Hyperkalemia: Secondary | ICD-10-CM | POA: Diagnosis not present

## 2021-09-17 DIAGNOSIS — I700 Atherosclerosis of aorta: Secondary | ICD-10-CM | POA: Diagnosis not present

## 2021-09-17 DIAGNOSIS — N186 End stage renal disease: Secondary | ICD-10-CM | POA: Diagnosis not present

## 2021-09-17 DIAGNOSIS — N3001 Acute cystitis with hematuria: Secondary | ICD-10-CM | POA: Diagnosis not present

## 2021-09-17 DIAGNOSIS — I051 Rheumatic mitral insufficiency: Secondary | ICD-10-CM | POA: Diagnosis not present

## 2021-09-17 DIAGNOSIS — Z7982 Long term (current) use of aspirin: Secondary | ICD-10-CM | POA: Diagnosis not present

## 2021-09-17 DIAGNOSIS — Z993 Dependence on wheelchair: Secondary | ICD-10-CM | POA: Diagnosis not present

## 2021-09-17 DIAGNOSIS — T8249XD Other complication of vascular dialysis catheter, subsequent encounter: Secondary | ICD-10-CM | POA: Diagnosis not present

## 2021-09-17 DIAGNOSIS — N2581 Secondary hyperparathyroidism of renal origin: Secondary | ICD-10-CM | POA: Diagnosis not present

## 2021-09-17 DIAGNOSIS — E1165 Type 2 diabetes mellitus with hyperglycemia: Secondary | ICD-10-CM | POA: Diagnosis not present

## 2021-09-17 DIAGNOSIS — K219 Gastro-esophageal reflux disease without esophagitis: Secondary | ICD-10-CM | POA: Diagnosis not present

## 2021-09-17 DIAGNOSIS — D62 Acute posthemorrhagic anemia: Secondary | ICD-10-CM | POA: Diagnosis not present

## 2021-09-17 DIAGNOSIS — I509 Heart failure, unspecified: Secondary | ICD-10-CM | POA: Diagnosis not present

## 2021-09-17 DIAGNOSIS — E785 Hyperlipidemia, unspecified: Secondary | ICD-10-CM | POA: Diagnosis not present

## 2021-09-17 DIAGNOSIS — E1140 Type 2 diabetes mellitus with diabetic neuropathy, unspecified: Secondary | ICD-10-CM | POA: Diagnosis not present

## 2021-09-17 DIAGNOSIS — F32A Depression, unspecified: Secondary | ICD-10-CM | POA: Diagnosis not present

## 2021-09-17 NOTE — Patient Outreach (Signed)
  Care Coordination   09/17/2021 Name: [name] MRN: [id number] DOB: January 20, 1960   Care Coordination Outreach Attempts:  An unsuccessful telephone outreach was attempted today to offer the patient information about available care coordination services as a benefit of their health plan.   Follow Up Plan:  Additional outreach attempts will be made to offer the patient care coordination information and services.   Encounter Outcome:  No Answer  Care Coordination Interventions Activated:  No   Care Coordination Interventions:  No, not indicated    Lazaro Arms RN, BSN, [place] [facility]   Phone: [phone]

## 2021-09-17 NOTE — Telephone Encounter (Signed)
Received call from [place], [place] with [facility]. Requesting VO for HH OT 1 week 2 and 2 week 2 with re-certification to work on ADLs, sitting balance, and upper body strength. Verbal auth given. Will route to [facility] for agreement/denial.  Also, requesting orders for hospital bed and drop arm bedside commode.

## 2021-09-17 NOTE — Telephone Encounter (Signed)
Carola Rhine, RN; Winnifred Friar, [place]; [place], Eleonore Chiquito, [place]; 1 other  Got this! Thanks!

## 2021-09-17 NOTE — Telephone Encounter (Signed)
Agree with VO. Order placed for hospital bed and bedside commode.

## 2021-09-17 NOTE — Telephone Encounter (Signed)
CM sent to Stephannie Peters at [facility] for hospital bed and drop arm commode on the recommendation of patient's [facility] Occupational Therapist.

## 2021-09-18 ENCOUNTER — Telehealth: Payer: Self-pay

## 2021-09-18 DIAGNOSIS — I051 Rheumatic mitral insufficiency: Secondary | ICD-10-CM | POA: Diagnosis not present

## 2021-09-18 DIAGNOSIS — F17210 Nicotine dependence, cigarettes, uncomplicated: Secondary | ICD-10-CM | POA: Diagnosis not present

## 2021-09-18 DIAGNOSIS — G3184 Mild cognitive impairment, so stated: Secondary | ICD-10-CM | POA: Diagnosis not present

## 2021-09-18 DIAGNOSIS — Z992 Dependence on renal dialysis: Secondary | ICD-10-CM | POA: Diagnosis not present

## 2021-09-18 DIAGNOSIS — D62 Acute posthemorrhagic anemia: Secondary | ICD-10-CM | POA: Diagnosis not present

## 2021-09-18 DIAGNOSIS — E1122 Type 2 diabetes mellitus with diabetic chronic kidney disease: Secondary | ICD-10-CM | POA: Diagnosis not present

## 2021-09-18 DIAGNOSIS — Z794 Long term (current) use of insulin: Secondary | ICD-10-CM | POA: Diagnosis not present

## 2021-09-18 DIAGNOSIS — I252 Old myocardial infarction: Secondary | ICD-10-CM | POA: Diagnosis not present

## 2021-09-18 DIAGNOSIS — Z993 Dependence on wheelchair: Secondary | ICD-10-CM | POA: Diagnosis not present

## 2021-09-18 DIAGNOSIS — R1311 Dysphagia, oral phase: Secondary | ICD-10-CM | POA: Diagnosis not present

## 2021-09-18 DIAGNOSIS — E1140 Type 2 diabetes mellitus with diabetic neuropathy, unspecified: Secondary | ICD-10-CM | POA: Diagnosis not present

## 2021-09-18 DIAGNOSIS — I509 Heart failure, unspecified: Secondary | ICD-10-CM | POA: Diagnosis not present

## 2021-09-18 DIAGNOSIS — E785 Hyperlipidemia, unspecified: Secondary | ICD-10-CM | POA: Diagnosis not present

## 2021-09-18 DIAGNOSIS — F32A Depression, unspecified: Secondary | ICD-10-CM | POA: Diagnosis not present

## 2021-09-18 DIAGNOSIS — E1165 Type 2 diabetes mellitus with hyperglycemia: Secondary | ICD-10-CM | POA: Diagnosis not present

## 2021-09-18 DIAGNOSIS — I70222 Atherosclerosis of native arteries of extremities with rest pain, left leg: Secondary | ICD-10-CM | POA: Diagnosis not present

## 2021-09-18 DIAGNOSIS — N186 End stage renal disease: Secondary | ICD-10-CM | POA: Diagnosis not present

## 2021-09-18 DIAGNOSIS — E1151 Type 2 diabetes mellitus with diabetic peripheral angiopathy without gangrene: Secondary | ICD-10-CM | POA: Diagnosis not present

## 2021-09-18 DIAGNOSIS — K219 Gastro-esophageal reflux disease without esophagitis: Secondary | ICD-10-CM | POA: Diagnosis not present

## 2021-09-18 DIAGNOSIS — N3001 Acute cystitis with hematuria: Secondary | ICD-10-CM | POA: Diagnosis not present

## 2021-09-18 DIAGNOSIS — I700 Atherosclerosis of aorta: Secondary | ICD-10-CM | POA: Diagnosis not present

## 2021-09-18 DIAGNOSIS — I110 Hypertensive heart disease with heart failure: Secondary | ICD-10-CM | POA: Diagnosis not present

## 2021-09-18 DIAGNOSIS — E875 Hyperkalemia: Secondary | ICD-10-CM | POA: Diagnosis not present

## 2021-09-18 DIAGNOSIS — S71111D Laceration without foreign body, right thigh, subsequent encounter: Secondary | ICD-10-CM | POA: Diagnosis not present

## 2021-09-18 DIAGNOSIS — Z7982 Long term (current) use of aspirin: Secondary | ICD-10-CM | POA: Diagnosis not present

## 2021-09-18 NOTE — Telephone Encounter (Signed)
Requesting hospital bed, please call pt's sister back.

## 2021-09-18 NOTE — Telephone Encounter (Signed)
CM sent yesterday to Stephannie Peters at [facility] for hospital bed and drop arm commode. She confirmed she received this order. Notified patient's sister and given number to call to schedule delivery [phone]).

## 2021-09-19 DIAGNOSIS — T8249XD Other complication of vascular dialysis catheter, subsequent encounter: Secondary | ICD-10-CM | POA: Diagnosis not present

## 2021-09-19 DIAGNOSIS — Z992 Dependence on renal dialysis: Secondary | ICD-10-CM | POA: Diagnosis not present

## 2021-09-19 DIAGNOSIS — N186 End stage renal disease: Secondary | ICD-10-CM | POA: Diagnosis not present

## 2021-09-19 DIAGNOSIS — N2581 Secondary hyperparathyroidism of renal origin: Secondary | ICD-10-CM | POA: Diagnosis not present

## 2021-09-21 ENCOUNTER — Ambulatory Visit: Payer: Self-pay | Admitting: Licensed Clinical Social Worker

## 2021-09-21 DIAGNOSIS — I700 Atherosclerosis of aorta: Secondary | ICD-10-CM | POA: Diagnosis not present

## 2021-09-21 DIAGNOSIS — Z794 Long term (current) use of insulin: Secondary | ICD-10-CM | POA: Diagnosis not present

## 2021-09-21 DIAGNOSIS — D62 Acute posthemorrhagic anemia: Secondary | ICD-10-CM | POA: Diagnosis not present

## 2021-09-21 DIAGNOSIS — I509 Heart failure, unspecified: Secondary | ICD-10-CM | POA: Diagnosis not present

## 2021-09-21 DIAGNOSIS — N186 End stage renal disease: Secondary | ICD-10-CM | POA: Diagnosis not present

## 2021-09-21 DIAGNOSIS — E875 Hyperkalemia: Secondary | ICD-10-CM | POA: Diagnosis not present

## 2021-09-21 DIAGNOSIS — I70222 Atherosclerosis of native arteries of extremities with rest pain, left leg: Secondary | ICD-10-CM | POA: Diagnosis not present

## 2021-09-21 DIAGNOSIS — E785 Hyperlipidemia, unspecified: Secondary | ICD-10-CM | POA: Diagnosis not present

## 2021-09-21 DIAGNOSIS — Z7982 Long term (current) use of aspirin: Secondary | ICD-10-CM | POA: Diagnosis not present

## 2021-09-21 DIAGNOSIS — E1151 Type 2 diabetes mellitus with diabetic peripheral angiopathy without gangrene: Secondary | ICD-10-CM | POA: Diagnosis not present

## 2021-09-21 DIAGNOSIS — Z992 Dependence on renal dialysis: Secondary | ICD-10-CM | POA: Diagnosis not present

## 2021-09-21 DIAGNOSIS — F17210 Nicotine dependence, cigarettes, uncomplicated: Secondary | ICD-10-CM | POA: Diagnosis not present

## 2021-09-21 DIAGNOSIS — N3001 Acute cystitis with hematuria: Secondary | ICD-10-CM | POA: Diagnosis not present

## 2021-09-21 DIAGNOSIS — K219 Gastro-esophageal reflux disease without esophagitis: Secondary | ICD-10-CM | POA: Diagnosis not present

## 2021-09-21 DIAGNOSIS — I252 Old myocardial infarction: Secondary | ICD-10-CM | POA: Diagnosis not present

## 2021-09-21 DIAGNOSIS — E1165 Type 2 diabetes mellitus with hyperglycemia: Secondary | ICD-10-CM | POA: Diagnosis not present

## 2021-09-21 DIAGNOSIS — R1311 Dysphagia, oral phase: Secondary | ICD-10-CM | POA: Diagnosis not present

## 2021-09-21 DIAGNOSIS — G3184 Mild cognitive impairment, so stated: Secondary | ICD-10-CM | POA: Diagnosis not present

## 2021-09-21 DIAGNOSIS — I110 Hypertensive heart disease with heart failure: Secondary | ICD-10-CM | POA: Diagnosis not present

## 2021-09-21 DIAGNOSIS — Z993 Dependence on wheelchair: Secondary | ICD-10-CM | POA: Diagnosis not present

## 2021-09-21 DIAGNOSIS — F32A Depression, unspecified: Secondary | ICD-10-CM | POA: Diagnosis not present

## 2021-09-21 DIAGNOSIS — E1122 Type 2 diabetes mellitus with diabetic chronic kidney disease: Secondary | ICD-10-CM | POA: Diagnosis not present

## 2021-09-21 DIAGNOSIS — I051 Rheumatic mitral insufficiency: Secondary | ICD-10-CM | POA: Diagnosis not present

## 2021-09-21 DIAGNOSIS — E1140 Type 2 diabetes mellitus with diabetic neuropathy, unspecified: Secondary | ICD-10-CM | POA: Diagnosis not present

## 2021-09-21 DIAGNOSIS — S71111D Laceration without foreign body, right thigh, subsequent encounter: Secondary | ICD-10-CM | POA: Diagnosis not present

## 2021-09-21 NOTE — Patient Outreach (Signed)
  Care Coordination   Initial Visit Note   09/21/2021 Name: [name] MRN: [id number] DOB: Jun 05, 1960  [name] is a 61 y.o. year old male who sees [name], MD for primary care. I spoke with  [name] by phone today.  What matters to the patients health and wellness today?  Upcoming appointments     Goals Addressed               This Visit's Progress     Care Coordination Activities (pt-stated)        Care Coordination Interventions: Provided education to patient and/or caregiver about advanced directives Reviewed scheduled/upcoming provider appointments including [facility] and [facility] Discussed plans with patient for ongoing care management follow up and provided patient with direct contact information for care management team Advised patient to discuss Personal Care with provider Screening for signs and symptoms of depression related to chronic disease state   Depression screen reviewed  Active listening / Reflection utilized  Caregiver stress acknowledged  Consideration of in-home help encouraged : options discussed  SW Completed SDOH screening.         SDOH assessments and interventions completed:  Yes     Care Coordination Interventions Activated:  Yes  Care Coordination Interventions:  Yes, provided   Follow up plan: No further intervention required.   Encounter Outcome:  Pt. Visit Completed   Lenor Derrick, MSW  Social Worker [facility] Care Management  [phone]

## 2021-09-22 ENCOUNTER — Ambulatory Visit: Payer: Medicare Other

## 2021-09-22 ENCOUNTER — Ambulatory Visit (INDEPENDENT_AMBULATORY_CARE_PROVIDER_SITE_OTHER): Payer: Medicare Other

## 2021-09-22 ENCOUNTER — Other Ambulatory Visit: Payer: Self-pay

## 2021-09-22 VITALS — BP 136/69 | HR 78 | Temp 98.3°F | Ht 68.0 in | Wt 140.0 lb

## 2021-09-22 DIAGNOSIS — Z794 Long term (current) use of insulin: Secondary | ICD-10-CM

## 2021-09-22 DIAGNOSIS — E1122 Type 2 diabetes mellitus with diabetic chronic kidney disease: Secondary | ICD-10-CM

## 2021-09-22 DIAGNOSIS — T8744 Infection of amputation stump, left lower extremity: Secondary | ICD-10-CM

## 2021-09-22 DIAGNOSIS — Z89511 Acquired absence of right leg below knee: Secondary | ICD-10-CM | POA: Diagnosis not present

## 2021-09-22 DIAGNOSIS — E1151 Type 2 diabetes mellitus with diabetic peripheral angiopathy without gangrene: Secondary | ICD-10-CM | POA: Diagnosis not present

## 2021-09-22 DIAGNOSIS — N186 End stage renal disease: Secondary | ICD-10-CM | POA: Diagnosis not present

## 2021-09-22 DIAGNOSIS — R32 Unspecified urinary incontinence: Secondary | ICD-10-CM

## 2021-09-22 DIAGNOSIS — Z23 Encounter for immunization: Secondary | ICD-10-CM | POA: Diagnosis not present

## 2021-09-22 DIAGNOSIS — I739 Peripheral vascular disease, unspecified: Secondary | ICD-10-CM

## 2021-09-22 DIAGNOSIS — Z992 Dependence on renal dialysis: Secondary | ICD-10-CM | POA: Diagnosis not present

## 2021-09-22 NOTE — Progress Notes (Deleted)
POST OPERATIVE OFFICE NOTE    CC:  F/u for surgery  HPI:  This is a 61 y.o. male who is s/p   Left ulnar-basilic arteriovenous fistula creation on 07/02/21 by Dr. Stanford Breed,  Second stage was never completed.  He presented to the hospital with positive left hand concerning for steal.  The fistula was ligated by Dr. Virl Cagey on 08/18/21.  This was followed by Dr. Trula Slade performing B UE angiogram for B UE ulcers.  Right hand fourth digit dry gangrene, left hand showed third and fourth digit dry gangrene spots at the finger tips.   Right IJ TDC was placed by IR 05/13/21  Pt returns today for follow up.  Pt states    Allergies  Allergen Reactions   Benicar [Olmesartan] Cough    Current Outpatient Medications  Medication Sig Dispense Refill   Accu-Chek FastClix Lancets MISC Check blood sugar 3 (three) times daily. 102 each 12   acetaminophen (TYLENOL) 325 MG tablet Take 2 tablets (650 mg total) by mouth every 4 (four) hours as needed for mild pain or moderate pain.     acetaminophen (TYLENOL) 650 MG CR tablet Take 2 tablets (1,300 mg total) by mouth every 6 (six) hours as needed for pain 240 tablet 0   amLODipine (NORVASC) 10 MG tablet Take 1 tablet (10 mg total) by mouth daily. (Patient not taking: Reported on 08/10/2021) 90 tablet 2   amLODipine (NORVASC) 10 MG tablet Take 1 tablet (10 mg total) by mouth daily for hypertension 30 tablet 0   aspirin EC 81 MG tablet Take 1 tablet (81 mg total) by mouth daily. Swallow whole. (Patient not taking: Reported on 07/02/2021) 30 tablet 0   aspirin EC 81 MG tablet Take 1 tablet (81 mg total) by mouth daily. 30 tablet 0   blood glucose meter kit and supplies KIT Dispense based on patient and insurance preference. Use up to four times daily as directed. (FOR ICD-9 250.00, 250.01). 1 each 0   Blood Glucose Monitoring Suppl (ACCU-CHEK AVIVA PLUS) w/Device KIT Check blood sugar 3 times a day 1 kit 0   Blood Glucose Monitoring Suppl (BLOOD GLUCOSE MONITOR SYSTEM)  w/Device KIT Use to check blood sugar 3 (three) times daily. 1 kit 0   carvedilol (COREG) 6.25 MG tablet Take 1 tablet (6.25 mg total) by mouth 2 (two) times daily with a meal. (Patient not taking: Reported on 08/10/2021) 180 tablet 1   carvedilol (COREG) 6.25 MG tablet Take 1 tablet (6.25 mg total) by mouth 2 (two) times daily with meals for hypertension 60 tablet 0   cetirizine (ZYRTEC ALLERGY) 10 MG tablet Take 1 tablet (10 mg total) by mouth daily. (Patient not taking: Reported on 07/02/2021) 30 tablet 2   cetirizine (ZYRTEC) 10 MG tablet Take 1 tablet (10 mg total) by mouth daily for allergic rhinitis 30 tablet 0   Continuous Blood Gluc Sensor (FREESTYLE LIBRE 2 SENSOR) MISC Use as directed every 14 days 2 each 0   Continuous Blood Gluc Sensor (FREESTYLE LIBRE 2 SENSOR) MISC Place 1 sensor on the skin every 14 days. Use to check glucose continuously 2 each 3   Darbepoetin Alfa (ARANESP) 100 MCG/0.5ML SOSY injection Inject 0.5 mLs (100 mcg total) into the vein every Wednesday with hemodialysis. 4.2 mL    DULoxetine (CYMBALTA) 30 MG capsule Take 2 capsules (60 mg total) by mouth daily. (Patient not taking: Reported on 08/10/2021) 180 capsule 1   DULoxetine (CYMBALTA) 60 MG capsule Take 1 capsule (60 mg  total) by mouth daily for depression (Do NOT CRUSH) 30 capsule 0   ezetimibe (ZETIA) 10 MG tablet Take 1 tablet (10 mg total) by mouth daily. (Patient not taking: Reported on 08/10/2021) 30 tablet 0   ezetimibe (ZETIA) 10 MG tablet Take 1 tablet (10 mg total) by mouth daily. 30 tablet 0   fluticasone (FLONASE) 50 MCG/ACT nasal spray Place 1 spray into both nostrils daily. (Patient not taking: Reported on 08/10/2021) 16 g 0   fluticasone (FLONASE) 50 MCG/ACT nasal spray Place 1 spray into both nostrils daily for allergic rhinitis 16 g 0   glucose blood (ACCU-CHEK AVIVA PLUS) test strip Check blood sugar 3 times a day 100 each 12   insulin aspart (NOVOLOG) 100 UNIT/ML FlexPen Inject 5 Units into the skin 3  (three) times daily with meals. 15 mL 4   insulin glargine (LANTUS) 100 UNIT/ML Solostar Pen Inject 16 Units into the skin at bedtime. (PRIME PEN WITH 2 UNITS PRIOR TO EACH USE) 3 mL 0   insulin glargine-yfgn (SEMGLEE) 100 UNIT/ML Pen Inject 10 Units into the skin at bedtime. 3 mL 3   Insulin Pen Needle 32G X 4 MM MISC Use to inject insulin 4 (four) times daily. 360 each 3   isosorbide mononitrate (IMDUR) 30 MG 24 hr tablet Take 0.5 tablets (15 mg total) by mouth daily for ischemic cardiomyopathy 15 tablet 0   melatonin (KP MELATONIN) 3 MG TABS tablet Take 1 tablet (3 mg total) by mouth at bedtime for insomnia. 30 tablet 0   oxyCODONE (OXY IR/ROXICODONE) 5 MG immediate release tablet Take 1 tablet (5 mg total) by mouth every 6 (six) hours as needed for pain 120 tablet 0   pantoprazole (PROTONIX) 40 MG tablet Take 1 tablet by mouth once a day 30 tablet 11   pantoprazole (PROTONIX) 40 MG tablet Take 1 tablet (40 mg total) by mouth daily for GERD. 30 tablet 0   polyethylene glycol powder (GLYCOLAX/MIRALAX) 17 GM/SCOOP powder Take 17 g by mouth daily. 510 g 0   pregabalin (LYRICA) 25 MG capsule Take 1 capsule (25 mg total) by mouth daily. (Patient not taking: Reported on 08/10/2021) 90 capsule 1   pregabalin (LYRICA) 25 MG capsule Take 1 capsule (25 mg total) by mouth daily. 30 capsule 0   rosuvastatin (CRESTOR) 10 MG tablet Take 1 tablet (10 mg total) by mouth daily. (Patient not taking: Reported on 08/10/2021) 30 tablet 0   rosuvastatin (CRESTOR) 10 MG tablet Take 1 tablet (10 mg total) by mouth daily for HLD 30 tablet 0   senna-docusate (SENEXON-S) 8.6-50 MG tablet Take 1 tablet by mouth at bedtime for constipation 30 tablet 0   senna-docusate (SENOKOT-S) 8.6-50 MG tablet Take 1 tablet by mouth at bedtime. (Patient not taking: Reported on 08/10/2021) 12 tablet 0   No current facility-administered medications for this visit.     ROS:  See HPI  Physical Exam:  ***  Incision:  *** Extremities:   *** Neuro: *** Abdomen:  ***    Assessment/Plan:  This is a 61 y.o. male who is s/p: ***  -***   Samantha Rhyne, PAC [facility] [phone]   Clinic MD:  *** 

## 2021-09-22 NOTE — Assessment & Plan Note (Signed)
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 His incision appears to be healing well without any warmth, discharge. Well approximated. He is afebrile. He inquires about home aid but does not have Medicaid. Encouraged him to apply.  -repeat CBC, BMP -encouraged to attend appointment for prosthesis  -f/u with orthopedics

## 2021-09-22 NOTE — Assessment & Plan Note (Signed)
Patient with recent A1c of 8.7 on 8/8. This was improved from 11.9 7 months ago. He was discharged from recent admission with Semglee 10 u nightly and Novolog 5 u with meals. He is not aware of his home blood sugars. He states his aunt takes his blood sugar and provides insulin.  -f/u in 2 weeks with home blood sugar recordings (as instructed) -Continue on current insulin regimen

## 2021-09-22 NOTE — Patient Instructions (Addendum)
[name], it was a pleasure seeing you today!  Today we discussed: Hospital follow up: Your wound is healing well after your amputation. Please continue to follow up with your orthopedist and attend your upcoming appointment for prosthesis. Hand Pain: Your hand pain is likely related to reduced blood flow. Please take your oxycodone sparingly. You may take 1 tablet every 6 hours only if needed for pain. Please follow up with vascular surgery later this week. Diabetes: Please continue taking your Novolog 5 u three times a day with meals and Semglee 10 u once at night. We will see you back in 2 weeks to continue with managing your chronic health problems.  I have ordered the following labs today:  Lab Orders         BMP8+Anion Gap         CBC no Diff       Tests ordered today:  none  Referrals ordered today:   Referral Orders  No referral(s) requested today     I have ordered the following medication/changed the following medications:   Stop the following medications: There are no discontinued medications.   Start the following medications: No orders of the defined types were placed in this encounter.    Follow-up:  2 weeks    Please make sure to arrive 15 minutes prior to your next appointment. If you arrive late, you may be asked to reschedule.   We look forward to seeing you next time. Please call our clinic at [phone] if you have any questions or concerns. The best time to call is Monday-Friday from 9am-4pm, but there is someone available 24/7. If after hours or the weekend, call the main hospital number and ask for the Internal Medicine Resident On-Call. If you need medication refills, please notify your pharmacy one week in advance and they will send [place] a request.  Thank you for letting us take part in your care. Wishing you the best!  Thank you, Linward Natal, MD

## 2021-09-22 NOTE — Progress Notes (Signed)
CC: Hospital follow-up  HPI:  [name] is a 61 y.o. with past medical history as below who presents for hospital follow-up.  Past Medical History:  Diagnosis Date   ESRD on hemodialysis ([place])    Gangrene ([place])    right foot   GERD (gastroesophageal reflux disease)    HFrEF (heart failure with reduced ejection fraction) ([place])    Hyperlipidemia    Hypertension    Neuromuscular disorder ([place])    diabetic neruopathy - hands   Osteomyelitis ([place]) 2010   left foot, s/p midfoot amputation   Osteomyelitis ([place]) 09/2013   RT BKA   Osteomyelitis of ankle or foot 05/2011   rt foot, s/p 5th ray amputation   PAD (peripheral artery disease) ([place])    Pneumonia 2010   Retroperitoneal hematoma    05/08/2021 renal biopsy complicated by retroperitoneal hematoma. 5/9 - 05/20/2021 H/H 6.9/20.9.  Imaging revealed large retroperitoneal hematoma.  S/P 2 units packed cells H/H stabilized with final values of 11.3/36.4.   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  Physical Exam:  Vitals:   09/22/21 1414  BP: 136/69  Pulse: 78  Temp: 98.3 F (36.8 C)  TempSrc: Oral  SpO2: 100%  Weight: 140 lb (63.5 kg)  Height: '5\' 8"'$  (1.727 m)   Physical Exam Constitutional:      General: He is not in acute distress. HENT:     Head: Normocephalic and atraumatic.  Eyes:     Extraocular Movements: Extraocular movements intact.  Cardiovascular:     Rate and Rhythm: Normal rate and regular rhythm.  Pulmonary:     Effort: Pulmonary effort is normal.     Breath sounds: No wheezing, rhonchi or rales.  Musculoskeletal:     Comments: Left AKA well healing. Right BKA with prosthesis. Dark lesions on some finger tips. No open wounds at fingers currently.  Neurological:     Mental Status: He is alert.      Assessment &  Plan:   See Encounters Tab for problem based charting.  PVD (peripheral vascular disease) Patient presents today with history of severe PAD. He is now s/p left AKA and more remote right BKA. Today, he reports numbness, tingling, pain, and stiffness in his fingers. This was evaluated during recent admission with angiography of his upper extremities. This demonstrated diminutive vessels distal to the elbows bilaterally though no occlusive disease more proximally.   -f/u with vascular surgery later this week -encouraged to keep hands warm but be cautious not to cause injury / burns  -Patient had 120 oxycodone tablets dispensed on 9/7. Encouraged him to use sparingly and discuss management of his hand pain with vascular surgery  Type 2 diabetes mellitus with diabetic peripheral angiopathy and gangrene, with long-term current use of insulin ([place]) Patient with recent A1c of 8.7 on 8/8. This was improved from 11.9 7 months ago. He was discharged from recent admission with Semglee 10 u nightly and Novolog 5 u with meals. He is not aware of his home blood sugars. He states his aunt takes his blood sugar and provides insulin.  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 His incision appears  to be healing well without any warmth, discharge. Well approximated. He is afebrile. He inquires about home aid but does not have Medicaid. Encouraged him to apply.  -repeat CBC, BMP -encouraged to attend appointment for prosthesis  -f/u with orthopedics  Incontinence of urine Patient endorses incontinence. Did not discuss in depth as time was limited however he requests supplies. He does not have medicaid and thus does not have access to [employer] products as requested.  -Encouraged application for medicaid -Recommend this be revisited in greater  depth at f/u in 2 weeks    Patient seen with Dr. Dareen Piano

## 2021-09-22 NOTE — Assessment & Plan Note (Addendum)
Patient presents today with history of severe PAD. He is now s/p left AKA and more remote right BKA. Today, he reports numbness, tingling, pain, and stiffness in his fingers. This was evaluated during recent admission with angiography of his upper extremities. This demonstrated diminutive vessels distal to the elbows bilaterally though no occlusive disease more proximally.   -f/u with vascular surgery later this week -encouraged to keep hands warm but be cautious not to cause injury / burns  -Patient had 120 oxycodone tablets dispensed on 9/7. Encouraged him to use sparingly and discuss management of his hand pain with vascular surgery

## 2021-09-23 DIAGNOSIS — E785 Hyperlipidemia, unspecified: Secondary | ICD-10-CM | POA: Diagnosis not present

## 2021-09-23 DIAGNOSIS — E1140 Type 2 diabetes mellitus with diabetic neuropathy, unspecified: Secondary | ICD-10-CM | POA: Diagnosis not present

## 2021-09-23 DIAGNOSIS — R1311 Dysphagia, oral phase: Secondary | ICD-10-CM | POA: Diagnosis not present

## 2021-09-23 DIAGNOSIS — Z7982 Long term (current) use of aspirin: Secondary | ICD-10-CM | POA: Diagnosis not present

## 2021-09-23 DIAGNOSIS — Z993 Dependence on wheelchair: Secondary | ICD-10-CM | POA: Diagnosis not present

## 2021-09-23 DIAGNOSIS — S71111D Laceration without foreign body, right thigh, subsequent encounter: Secondary | ICD-10-CM | POA: Diagnosis not present

## 2021-09-23 DIAGNOSIS — K219 Gastro-esophageal reflux disease without esophagitis: Secondary | ICD-10-CM | POA: Diagnosis not present

## 2021-09-23 DIAGNOSIS — R32 Unspecified urinary incontinence: Secondary | ICD-10-CM | POA: Insufficient documentation

## 2021-09-23 DIAGNOSIS — I70222 Atherosclerosis of native arteries of extremities with rest pain, left leg: Secondary | ICD-10-CM | POA: Diagnosis not present

## 2021-09-23 DIAGNOSIS — N3001 Acute cystitis with hematuria: Secondary | ICD-10-CM | POA: Diagnosis not present

## 2021-09-23 DIAGNOSIS — Z794 Long term (current) use of insulin: Secondary | ICD-10-CM | POA: Diagnosis not present

## 2021-09-23 DIAGNOSIS — D62 Acute posthemorrhagic anemia: Secondary | ICD-10-CM | POA: Diagnosis not present

## 2021-09-23 DIAGNOSIS — F17210 Nicotine dependence, cigarettes, uncomplicated: Secondary | ICD-10-CM | POA: Diagnosis not present

## 2021-09-23 DIAGNOSIS — I700 Atherosclerosis of aorta: Secondary | ICD-10-CM | POA: Diagnosis not present

## 2021-09-23 DIAGNOSIS — Z992 Dependence on renal dialysis: Secondary | ICD-10-CM | POA: Diagnosis not present

## 2021-09-23 DIAGNOSIS — I051 Rheumatic mitral insufficiency: Secondary | ICD-10-CM | POA: Diagnosis not present

## 2021-09-23 DIAGNOSIS — I252 Old myocardial infarction: Secondary | ICD-10-CM | POA: Diagnosis not present

## 2021-09-23 DIAGNOSIS — F32A Depression, unspecified: Secondary | ICD-10-CM | POA: Diagnosis not present

## 2021-09-23 DIAGNOSIS — I509 Heart failure, unspecified: Secondary | ICD-10-CM | POA: Diagnosis not present

## 2021-09-23 DIAGNOSIS — I110 Hypertensive heart disease with heart failure: Secondary | ICD-10-CM | POA: Diagnosis not present

## 2021-09-23 DIAGNOSIS — E1122 Type 2 diabetes mellitus with diabetic chronic kidney disease: Secondary | ICD-10-CM | POA: Diagnosis not present

## 2021-09-23 DIAGNOSIS — N186 End stage renal disease: Secondary | ICD-10-CM | POA: Diagnosis not present

## 2021-09-23 DIAGNOSIS — G3184 Mild cognitive impairment, so stated: Secondary | ICD-10-CM | POA: Diagnosis not present

## 2021-09-23 DIAGNOSIS — E1165 Type 2 diabetes mellitus with hyperglycemia: Secondary | ICD-10-CM | POA: Diagnosis not present

## 2021-09-23 DIAGNOSIS — E875 Hyperkalemia: Secondary | ICD-10-CM | POA: Diagnosis not present

## 2021-09-23 DIAGNOSIS — E1151 Type 2 diabetes mellitus with diabetic peripheral angiopathy without gangrene: Secondary | ICD-10-CM | POA: Diagnosis not present

## 2021-09-23 LAB — CBC
Hematocrit: 33.9 % — ABNORMAL LOW (ref 37.5–51.0)
Hemoglobin: 11.1 g/dL — ABNORMAL LOW (ref 13.0–17.7)
MCH: 29.1 pg (ref 26.6–33.0)
MCHC: 32.7 g/dL (ref 31.5–35.7)
MCV: 89 fL (ref 79–97)
Platelets: 243 10*3/uL (ref 150–450)
RBC: 3.81 x10E6/uL — ABNORMAL LOW (ref 4.14–5.80)
RDW: 15.2 % (ref 11.6–15.4)
WBC: 8.8 10*3/uL (ref 3.4–10.8)

## 2021-09-23 LAB — BMP8+ANION GAP
Anion Gap: 17 mmol/L (ref 10.0–18.0)
BUN/Creatinine Ratio: 7 — ABNORMAL LOW (ref 10–24)
BUN: 22 mg/dL (ref 8–27)
CO2: 22 mmol/L (ref 20–29)
Calcium: 8.7 mg/dL (ref 8.6–10.2)
Chloride: 97 mmol/L (ref 96–106)
Creatinine, Ser: 3.11 mg/dL — ABNORMAL HIGH (ref 0.76–1.27)
Glucose: 159 mg/dL — ABNORMAL HIGH (ref 70–99)
Potassium: 4.1 mmol/L (ref 3.5–5.2)
Sodium: 136 mmol/L (ref 134–144)
eGFR: 22 mL/min/{1.73_m2} — ABNORMAL LOW (ref >59)

## 2021-09-23 NOTE — Assessment & Plan Note (Signed)
Patient endorses incontinence. Did not discuss in depth as time was limited however he requests supplies. He does not have medicaid and thus does not have access to [employer] products as requested.  -Encouraged application for medicaid -Recommend this be revisited in greater depth at f/u in 2 weeks

## 2021-09-24 DIAGNOSIS — Z992 Dependence on renal dialysis: Secondary | ICD-10-CM | POA: Diagnosis not present

## 2021-09-24 DIAGNOSIS — N186 End stage renal disease: Secondary | ICD-10-CM | POA: Diagnosis not present

## 2021-09-24 DIAGNOSIS — T8249XD Other complication of vascular dialysis catheter, subsequent encounter: Secondary | ICD-10-CM | POA: Diagnosis not present

## 2021-09-24 DIAGNOSIS — N2581 Secondary hyperparathyroidism of renal origin: Secondary | ICD-10-CM | POA: Diagnosis not present

## 2021-09-24 NOTE — Progress Notes (Signed)
Internal Medicine Clinic Attending   I saw and evaluated the patient.  I personally confirmed the key portions of the history and exam documented by Dr. White and I reviewed pertinent patient test results.  The assessment, diagnosis, and plan were formulated together and I agree with the documentation in the resident's note.  

## 2021-09-25 ENCOUNTER — Telehealth: Payer: Self-pay

## 2021-09-25 ENCOUNTER — Ambulatory Visit: Payer: Self-pay

## 2021-09-25 ENCOUNTER — Ambulatory Visit: Payer: Medicare Other

## 2021-09-25 DIAGNOSIS — S71111D Laceration without foreign body, right thigh, subsequent encounter: Secondary | ICD-10-CM | POA: Diagnosis not present

## 2021-09-25 DIAGNOSIS — Z7982 Long term (current) use of aspirin: Secondary | ICD-10-CM | POA: Diagnosis not present

## 2021-09-25 DIAGNOSIS — N3001 Acute cystitis with hematuria: Secondary | ICD-10-CM | POA: Diagnosis not present

## 2021-09-25 DIAGNOSIS — N186 End stage renal disease: Secondary | ICD-10-CM | POA: Diagnosis not present

## 2021-09-25 DIAGNOSIS — I252 Old myocardial infarction: Secondary | ICD-10-CM | POA: Diagnosis not present

## 2021-09-25 DIAGNOSIS — D62 Acute posthemorrhagic anemia: Secondary | ICD-10-CM | POA: Diagnosis not present

## 2021-09-25 DIAGNOSIS — I70222 Atherosclerosis of native arteries of extremities with rest pain, left leg: Secondary | ICD-10-CM | POA: Diagnosis not present

## 2021-09-25 DIAGNOSIS — F17210 Nicotine dependence, cigarettes, uncomplicated: Secondary | ICD-10-CM | POA: Diagnosis not present

## 2021-09-25 DIAGNOSIS — E1165 Type 2 diabetes mellitus with hyperglycemia: Secondary | ICD-10-CM | POA: Diagnosis not present

## 2021-09-25 DIAGNOSIS — Z993 Dependence on wheelchair: Secondary | ICD-10-CM | POA: Diagnosis not present

## 2021-09-25 DIAGNOSIS — F32A Depression, unspecified: Secondary | ICD-10-CM | POA: Diagnosis not present

## 2021-09-25 DIAGNOSIS — E875 Hyperkalemia: Secondary | ICD-10-CM | POA: Diagnosis not present

## 2021-09-25 DIAGNOSIS — E1140 Type 2 diabetes mellitus with diabetic neuropathy, unspecified: Secondary | ICD-10-CM | POA: Diagnosis not present

## 2021-09-25 DIAGNOSIS — I051 Rheumatic mitral insufficiency: Secondary | ICD-10-CM | POA: Diagnosis not present

## 2021-09-25 DIAGNOSIS — E785 Hyperlipidemia, unspecified: Secondary | ICD-10-CM | POA: Diagnosis not present

## 2021-09-25 DIAGNOSIS — K219 Gastro-esophageal reflux disease without esophagitis: Secondary | ICD-10-CM | POA: Diagnosis not present

## 2021-09-25 DIAGNOSIS — Z794 Long term (current) use of insulin: Secondary | ICD-10-CM | POA: Diagnosis not present

## 2021-09-25 DIAGNOSIS — I509 Heart failure, unspecified: Secondary | ICD-10-CM | POA: Diagnosis not present

## 2021-09-25 DIAGNOSIS — I110 Hypertensive heart disease with heart failure: Secondary | ICD-10-CM | POA: Diagnosis not present

## 2021-09-25 DIAGNOSIS — R1311 Dysphagia, oral phase: Secondary | ICD-10-CM | POA: Diagnosis not present

## 2021-09-25 DIAGNOSIS — E1151 Type 2 diabetes mellitus with diabetic peripheral angiopathy without gangrene: Secondary | ICD-10-CM | POA: Diagnosis not present

## 2021-09-25 DIAGNOSIS — G3184 Mild cognitive impairment, so stated: Secondary | ICD-10-CM | POA: Diagnosis not present

## 2021-09-25 DIAGNOSIS — I700 Atherosclerosis of aorta: Secondary | ICD-10-CM | POA: Diagnosis not present

## 2021-09-25 DIAGNOSIS — Z992 Dependence on renal dialysis: Secondary | ICD-10-CM | POA: Diagnosis not present

## 2021-09-25 DIAGNOSIS — E1122 Type 2 diabetes mellitus with diabetic chronic kidney disease: Secondary | ICD-10-CM | POA: Diagnosis not present

## 2021-09-25 NOTE — Telephone Encounter (Signed)
Received call from [place], [place] with [facility]. Requesting VO for 1 additional [place] SW visit next week. Verbal auth given. Will route to [facility] for agreement/denial.

## 2021-09-25 NOTE — Telephone Encounter (Signed)
Returned call to [employer]. No answer. Left message on VM requesting return call.

## 2021-09-25 NOTE — Telephone Encounter (Signed)
[name] with [facility] requesting VO for social work. Please call back.

## 2021-09-26 DIAGNOSIS — N186 End stage renal disease: Secondary | ICD-10-CM | POA: Diagnosis not present

## 2021-09-26 DIAGNOSIS — Z992 Dependence on renal dialysis: Secondary | ICD-10-CM | POA: Diagnosis not present

## 2021-09-26 DIAGNOSIS — T8249XD Other complication of vascular dialysis catheter, subsequent encounter: Secondary | ICD-10-CM | POA: Diagnosis not present

## 2021-09-26 DIAGNOSIS — N2581 Secondary hyperparathyroidism of renal origin: Secondary | ICD-10-CM | POA: Diagnosis not present

## 2021-09-26 NOTE — Patient Outreach (Signed)
  Care Coordination   Initial Visit Note   09/25/21 Name: [name] MRN: [id number] DOB: February 25, 1960  [name] is a 61 y.o. year old male who sees [name], MD for primary care. I spoke with  [name] by phone today.  What matters to the patients health and wellness today?  Learn to monitor and manage my diabetes    Goals Addressed             This Visit's Progress    MOnitor and manage my diabetes       Care Coordination Interventions: Discussed plans with patient for ongoing care management follow up and provided patient with direct contact information for care management team Review of patient status, including review of consultants reports, relevant laboratory and other test results, and medications completed Active listening / Reflection utilized  Problem [place] strategies reviewed According to Mr. Sexson, his aunt manages his medications and blood sugar check-ups. She stated that his blood sugar levels range from 180 to 228. However, she had to end the call, and she will be calling me later to discuss the medications in detail. Mr. Farney seems to be very worried about getting another prosthetic for his other leg. He mentioned that he experiences difficulties transferring from the bed to his wheelchair.        SDOH assessments and interventions completed:  No     Care Coordination Interventions Activated:  Yes  Care Coordination Interventions:  Yes, provided   Follow up plan: Follow up call scheduled for 9/29 1130 am    Encounter Outcome:  Pt. Visit Completed   Lazaro Arms RN, BSN, [facility]   Phone: [phone]

## 2021-09-26 NOTE — Patient Instructions (Signed)
Visit Information  Thank you for taking time to visit with me today. Please don't hesitate to contact me if I can be of assistance to you.   Following are the goals we discussed today:   Goals Addressed             This Visit's Progress    MOnitor and manage my diabetes       Care Coordination Interventions: Discussed plans with patient for ongoing care management follow up and provided patient with direct contact information for care management team Review of patient status, including review of consultants reports, relevant laboratory and other test results, and medications completed Active listening / Reflection utilized  Problem [place] strategies reviewed According to [name], his aunt manages his medications and blood sugar check-ups. She stated that his blood sugar levels range from 180 to 228. However, she had to end the call, and she will be calling me later to discuss the medications in detail. [name] seems to be very worried about getting another prosthetic for his other leg. He mentioned that he experiences difficulties transferring from the bed to his wheelchair.        Our next appointment is by telephone on 9/29 at 1130 am  Please call the care guide team at [phone] if you need to cancel or reschedule your appointment.   If you are experiencing a Mental Health or [place] or need someone to talk to, please call 1-800-273-TALK (toll free, 24 hour hotline)  Patient verbalizes understanding of instructions and care plan provided today and agrees to view in [place]. Active MyChart status and patient understanding of how to access instructions and care plan via MyChart confirmed with patient.     Lazaro Arms RN, BSN, [facility]   Phone: [phone]

## 2021-09-29 DIAGNOSIS — Z992 Dependence on renal dialysis: Secondary | ICD-10-CM | POA: Diagnosis not present

## 2021-09-29 DIAGNOSIS — T8249XD Other complication of vascular dialysis catheter, subsequent encounter: Secondary | ICD-10-CM | POA: Diagnosis not present

## 2021-09-29 DIAGNOSIS — N186 End stage renal disease: Secondary | ICD-10-CM | POA: Diagnosis not present

## 2021-09-29 DIAGNOSIS — N2581 Secondary hyperparathyroidism of renal origin: Secondary | ICD-10-CM | POA: Diagnosis not present

## 2021-09-30 DIAGNOSIS — Z993 Dependence on wheelchair: Secondary | ICD-10-CM | POA: Diagnosis not present

## 2021-09-30 DIAGNOSIS — E785 Hyperlipidemia, unspecified: Secondary | ICD-10-CM | POA: Diagnosis not present

## 2021-09-30 DIAGNOSIS — F32A Depression, unspecified: Secondary | ICD-10-CM | POA: Diagnosis not present

## 2021-09-30 DIAGNOSIS — N3001 Acute cystitis with hematuria: Secondary | ICD-10-CM | POA: Diagnosis not present

## 2021-09-30 DIAGNOSIS — E875 Hyperkalemia: Secondary | ICD-10-CM | POA: Diagnosis not present

## 2021-09-30 DIAGNOSIS — E1151 Type 2 diabetes mellitus with diabetic peripheral angiopathy without gangrene: Secondary | ICD-10-CM | POA: Diagnosis not present

## 2021-09-30 DIAGNOSIS — I700 Atherosclerosis of aorta: Secondary | ICD-10-CM | POA: Diagnosis not present

## 2021-09-30 DIAGNOSIS — S71111D Laceration without foreign body, right thigh, subsequent encounter: Secondary | ICD-10-CM | POA: Diagnosis not present

## 2021-09-30 DIAGNOSIS — N186 End stage renal disease: Secondary | ICD-10-CM | POA: Diagnosis not present

## 2021-09-30 DIAGNOSIS — R1311 Dysphagia, oral phase: Secondary | ICD-10-CM | POA: Diagnosis not present

## 2021-09-30 DIAGNOSIS — I70222 Atherosclerosis of native arteries of extremities with rest pain, left leg: Secondary | ICD-10-CM | POA: Diagnosis not present

## 2021-09-30 DIAGNOSIS — I509 Heart failure, unspecified: Secondary | ICD-10-CM | POA: Diagnosis not present

## 2021-09-30 DIAGNOSIS — E1165 Type 2 diabetes mellitus with hyperglycemia: Secondary | ICD-10-CM | POA: Diagnosis not present

## 2021-09-30 DIAGNOSIS — Z992 Dependence on renal dialysis: Secondary | ICD-10-CM | POA: Diagnosis not present

## 2021-09-30 DIAGNOSIS — I051 Rheumatic mitral insufficiency: Secondary | ICD-10-CM | POA: Diagnosis not present

## 2021-09-30 DIAGNOSIS — D62 Acute posthemorrhagic anemia: Secondary | ICD-10-CM | POA: Diagnosis not present

## 2021-09-30 DIAGNOSIS — E1140 Type 2 diabetes mellitus with diabetic neuropathy, unspecified: Secondary | ICD-10-CM | POA: Diagnosis not present

## 2021-09-30 DIAGNOSIS — I252 Old myocardial infarction: Secondary | ICD-10-CM | POA: Diagnosis not present

## 2021-09-30 DIAGNOSIS — F17210 Nicotine dependence, cigarettes, uncomplicated: Secondary | ICD-10-CM | POA: Diagnosis not present

## 2021-09-30 DIAGNOSIS — K219 Gastro-esophageal reflux disease without esophagitis: Secondary | ICD-10-CM | POA: Diagnosis not present

## 2021-09-30 DIAGNOSIS — I110 Hypertensive heart disease with heart failure: Secondary | ICD-10-CM | POA: Diagnosis not present

## 2021-09-30 DIAGNOSIS — Z794 Long term (current) use of insulin: Secondary | ICD-10-CM | POA: Diagnosis not present

## 2021-09-30 DIAGNOSIS — G3184 Mild cognitive impairment, so stated: Secondary | ICD-10-CM | POA: Diagnosis not present

## 2021-09-30 DIAGNOSIS — E1122 Type 2 diabetes mellitus with diabetic chronic kidney disease: Secondary | ICD-10-CM | POA: Diagnosis not present

## 2021-09-30 DIAGNOSIS — Z7982 Long term (current) use of aspirin: Secondary | ICD-10-CM | POA: Diagnosis not present

## 2021-10-01 DIAGNOSIS — T8249XD Other complication of vascular dialysis catheter, subsequent encounter: Secondary | ICD-10-CM | POA: Diagnosis not present

## 2021-10-01 DIAGNOSIS — Z992 Dependence on renal dialysis: Secondary | ICD-10-CM | POA: Diagnosis not present

## 2021-10-01 DIAGNOSIS — N186 End stage renal disease: Secondary | ICD-10-CM | POA: Diagnosis not present

## 2021-10-01 DIAGNOSIS — N2581 Secondary hyperparathyroidism of renal origin: Secondary | ICD-10-CM | POA: Diagnosis not present

## 2021-10-02 ENCOUNTER — Ambulatory Visit: Payer: Self-pay

## 2021-10-02 DIAGNOSIS — E1122 Type 2 diabetes mellitus with diabetic chronic kidney disease: Secondary | ICD-10-CM | POA: Diagnosis not present

## 2021-10-02 DIAGNOSIS — I110 Hypertensive heart disease with heart failure: Secondary | ICD-10-CM | POA: Diagnosis not present

## 2021-10-02 DIAGNOSIS — N3001 Acute cystitis with hematuria: Secondary | ICD-10-CM | POA: Diagnosis not present

## 2021-10-02 DIAGNOSIS — Z992 Dependence on renal dialysis: Secondary | ICD-10-CM | POA: Diagnosis not present

## 2021-10-02 DIAGNOSIS — Z794 Long term (current) use of insulin: Secondary | ICD-10-CM | POA: Diagnosis not present

## 2021-10-02 DIAGNOSIS — I509 Heart failure, unspecified: Secondary | ICD-10-CM | POA: Diagnosis not present

## 2021-10-02 DIAGNOSIS — R1311 Dysphagia, oral phase: Secondary | ICD-10-CM | POA: Diagnosis not present

## 2021-10-02 DIAGNOSIS — Z89511 Acquired absence of right leg below knee: Secondary | ICD-10-CM | POA: Diagnosis not present

## 2021-10-02 DIAGNOSIS — I051 Rheumatic mitral insufficiency: Secondary | ICD-10-CM | POA: Diagnosis not present

## 2021-10-02 DIAGNOSIS — E1151 Type 2 diabetes mellitus with diabetic peripheral angiopathy without gangrene: Secondary | ICD-10-CM | POA: Diagnosis not present

## 2021-10-02 DIAGNOSIS — I70222 Atherosclerosis of native arteries of extremities with rest pain, left leg: Secondary | ICD-10-CM | POA: Diagnosis not present

## 2021-10-02 DIAGNOSIS — Z993 Dependence on wheelchair: Secondary | ICD-10-CM | POA: Diagnosis not present

## 2021-10-02 DIAGNOSIS — F17210 Nicotine dependence, cigarettes, uncomplicated: Secondary | ICD-10-CM | POA: Diagnosis not present

## 2021-10-02 DIAGNOSIS — Z7982 Long term (current) use of aspirin: Secondary | ICD-10-CM | POA: Diagnosis not present

## 2021-10-02 DIAGNOSIS — E785 Hyperlipidemia, unspecified: Secondary | ICD-10-CM | POA: Diagnosis not present

## 2021-10-02 DIAGNOSIS — N186 End stage renal disease: Secondary | ICD-10-CM | POA: Diagnosis not present

## 2021-10-02 DIAGNOSIS — D62 Acute posthemorrhagic anemia: Secondary | ICD-10-CM | POA: Diagnosis not present

## 2021-10-02 DIAGNOSIS — F32A Depression, unspecified: Secondary | ICD-10-CM | POA: Diagnosis not present

## 2021-10-02 DIAGNOSIS — K219 Gastro-esophageal reflux disease without esophagitis: Secondary | ICD-10-CM | POA: Diagnosis not present

## 2021-10-02 DIAGNOSIS — I252 Old myocardial infarction: Secondary | ICD-10-CM | POA: Diagnosis not present

## 2021-10-02 DIAGNOSIS — S71111D Laceration without foreign body, right thigh, subsequent encounter: Secondary | ICD-10-CM | POA: Diagnosis not present

## 2021-10-02 DIAGNOSIS — I700 Atherosclerosis of aorta: Secondary | ICD-10-CM | POA: Diagnosis not present

## 2021-10-02 DIAGNOSIS — E1165 Type 2 diabetes mellitus with hyperglycemia: Secondary | ICD-10-CM | POA: Diagnosis not present

## 2021-10-02 DIAGNOSIS — E875 Hyperkalemia: Secondary | ICD-10-CM | POA: Diagnosis not present

## 2021-10-02 DIAGNOSIS — E1140 Type 2 diabetes mellitus with diabetic neuropathy, unspecified: Secondary | ICD-10-CM | POA: Diagnosis not present

## 2021-10-02 DIAGNOSIS — G3184 Mild cognitive impairment, so stated: Secondary | ICD-10-CM | POA: Diagnosis not present

## 2021-10-02 NOTE — Patient Outreach (Signed)
  Care Coordination   10/02/2021 Name: [name] MRN: [id number] DOB: [birth year]   Care Coordination Outreach Attempts:  An unsuccessful telephone outreach was attempted for a scheduled appointment today.  Follow Up Plan:  Additional outreach attempts will be made to offer the patient care coordination information and services.   Encounter Outcome:  No Answer  Care Coordination Interventions Activated:  No   Care Coordination Interventions:  No, not indicated    Johnney Killian, RN, BSN, CCM Care Management Coordinator [facility] Phone: [phone]: [phone]

## 2021-10-03 DIAGNOSIS — Z992 Dependence on renal dialysis: Secondary | ICD-10-CM | POA: Diagnosis not present

## 2021-10-03 DIAGNOSIS — E1122 Type 2 diabetes mellitus with diabetic chronic kidney disease: Secondary | ICD-10-CM | POA: Diagnosis not present

## 2021-10-03 DIAGNOSIS — N186 End stage renal disease: Secondary | ICD-10-CM | POA: Diagnosis not present

## 2021-10-06 DIAGNOSIS — D689 Coagulation defect, unspecified: Secondary | ICD-10-CM | POA: Diagnosis not present

## 2021-10-06 DIAGNOSIS — Z992 Dependence on renal dialysis: Secondary | ICD-10-CM | POA: Diagnosis not present

## 2021-10-06 DIAGNOSIS — D688 Other specified coagulation defects: Secondary | ICD-10-CM | POA: Diagnosis not present

## 2021-10-06 DIAGNOSIS — T8249XD Other complication of vascular dialysis catheter, subsequent encounter: Secondary | ICD-10-CM | POA: Diagnosis not present

## 2021-10-06 DIAGNOSIS — N186 End stage renal disease: Secondary | ICD-10-CM | POA: Diagnosis not present

## 2021-10-06 DIAGNOSIS — Z23 Encounter for immunization: Secondary | ICD-10-CM | POA: Diagnosis not present

## 2021-10-06 DIAGNOSIS — N2581 Secondary hyperparathyroidism of renal origin: Secondary | ICD-10-CM | POA: Diagnosis not present

## 2021-10-07 DIAGNOSIS — E1122 Type 2 diabetes mellitus with diabetic chronic kidney disease: Secondary | ICD-10-CM | POA: Diagnosis not present

## 2021-10-07 DIAGNOSIS — I110 Hypertensive heart disease with heart failure: Secondary | ICD-10-CM | POA: Diagnosis not present

## 2021-10-07 DIAGNOSIS — G3184 Mild cognitive impairment, so stated: Secondary | ICD-10-CM | POA: Diagnosis not present

## 2021-10-07 DIAGNOSIS — N3001 Acute cystitis with hematuria: Secondary | ICD-10-CM | POA: Diagnosis not present

## 2021-10-07 DIAGNOSIS — I252 Old myocardial infarction: Secondary | ICD-10-CM | POA: Diagnosis not present

## 2021-10-07 DIAGNOSIS — I700 Atherosclerosis of aorta: Secondary | ICD-10-CM | POA: Diagnosis not present

## 2021-10-07 DIAGNOSIS — Z794 Long term (current) use of insulin: Secondary | ICD-10-CM | POA: Diagnosis not present

## 2021-10-07 DIAGNOSIS — E1140 Type 2 diabetes mellitus with diabetic neuropathy, unspecified: Secondary | ICD-10-CM | POA: Diagnosis not present

## 2021-10-07 DIAGNOSIS — I051 Rheumatic mitral insufficiency: Secondary | ICD-10-CM | POA: Diagnosis not present

## 2021-10-07 DIAGNOSIS — Z7982 Long term (current) use of aspirin: Secondary | ICD-10-CM | POA: Diagnosis not present

## 2021-10-07 DIAGNOSIS — D62 Acute posthemorrhagic anemia: Secondary | ICD-10-CM | POA: Diagnosis not present

## 2021-10-07 DIAGNOSIS — K219 Gastro-esophageal reflux disease without esophagitis: Secondary | ICD-10-CM | POA: Diagnosis not present

## 2021-10-07 DIAGNOSIS — E1151 Type 2 diabetes mellitus with diabetic peripheral angiopathy without gangrene: Secondary | ICD-10-CM | POA: Diagnosis not present

## 2021-10-07 DIAGNOSIS — E785 Hyperlipidemia, unspecified: Secondary | ICD-10-CM | POA: Diagnosis not present

## 2021-10-07 DIAGNOSIS — I70222 Atherosclerosis of native arteries of extremities with rest pain, left leg: Secondary | ICD-10-CM | POA: Diagnosis not present

## 2021-10-07 DIAGNOSIS — R1311 Dysphagia, oral phase: Secondary | ICD-10-CM | POA: Diagnosis not present

## 2021-10-07 DIAGNOSIS — F17210 Nicotine dependence, cigarettes, uncomplicated: Secondary | ICD-10-CM | POA: Diagnosis not present

## 2021-10-07 DIAGNOSIS — N186 End stage renal disease: Secondary | ICD-10-CM | POA: Diagnosis not present

## 2021-10-07 DIAGNOSIS — Z993 Dependence on wheelchair: Secondary | ICD-10-CM | POA: Diagnosis not present

## 2021-10-07 DIAGNOSIS — I509 Heart failure, unspecified: Secondary | ICD-10-CM | POA: Diagnosis not present

## 2021-10-07 DIAGNOSIS — Z992 Dependence on renal dialysis: Secondary | ICD-10-CM | POA: Diagnosis not present

## 2021-10-07 DIAGNOSIS — E1165 Type 2 diabetes mellitus with hyperglycemia: Secondary | ICD-10-CM | POA: Diagnosis not present

## 2021-10-07 DIAGNOSIS — S71111D Laceration without foreign body, right thigh, subsequent encounter: Secondary | ICD-10-CM | POA: Diagnosis not present

## 2021-10-07 DIAGNOSIS — E875 Hyperkalemia: Secondary | ICD-10-CM | POA: Diagnosis not present

## 2021-10-07 DIAGNOSIS — F32A Depression, unspecified: Secondary | ICD-10-CM | POA: Diagnosis not present

## 2021-10-08 DIAGNOSIS — T8249XD Other complication of vascular dialysis catheter, subsequent encounter: Secondary | ICD-10-CM | POA: Diagnosis not present

## 2021-10-08 DIAGNOSIS — Z23 Encounter for immunization: Secondary | ICD-10-CM | POA: Diagnosis not present

## 2021-10-08 DIAGNOSIS — N2581 Secondary hyperparathyroidism of renal origin: Secondary | ICD-10-CM | POA: Diagnosis not present

## 2021-10-08 DIAGNOSIS — D688 Other specified coagulation defects: Secondary | ICD-10-CM | POA: Diagnosis not present

## 2021-10-08 DIAGNOSIS — D689 Coagulation defect, unspecified: Secondary | ICD-10-CM | POA: Diagnosis not present

## 2021-10-08 DIAGNOSIS — N186 End stage renal disease: Secondary | ICD-10-CM | POA: Diagnosis not present

## 2021-10-08 DIAGNOSIS — Z992 Dependence on renal dialysis: Secondary | ICD-10-CM | POA: Diagnosis not present

## 2021-10-09 ENCOUNTER — Telehealth: Payer: Self-pay | Admitting: *Deleted

## 2021-10-09 DIAGNOSIS — E1122 Type 2 diabetes mellitus with diabetic chronic kidney disease: Secondary | ICD-10-CM | POA: Diagnosis not present

## 2021-10-09 DIAGNOSIS — I700 Atherosclerosis of aorta: Secondary | ICD-10-CM | POA: Diagnosis not present

## 2021-10-09 DIAGNOSIS — I110 Hypertensive heart disease with heart failure: Secondary | ICD-10-CM | POA: Diagnosis not present

## 2021-10-09 DIAGNOSIS — Z992 Dependence on renal dialysis: Secondary | ICD-10-CM | POA: Diagnosis not present

## 2021-10-09 DIAGNOSIS — N3001 Acute cystitis with hematuria: Secondary | ICD-10-CM | POA: Diagnosis not present

## 2021-10-09 DIAGNOSIS — E785 Hyperlipidemia, unspecified: Secondary | ICD-10-CM | POA: Diagnosis not present

## 2021-10-09 DIAGNOSIS — F17210 Nicotine dependence, cigarettes, uncomplicated: Secondary | ICD-10-CM | POA: Diagnosis not present

## 2021-10-09 DIAGNOSIS — Z993 Dependence on wheelchair: Secondary | ICD-10-CM | POA: Diagnosis not present

## 2021-10-09 DIAGNOSIS — S71111D Laceration without foreign body, right thigh, subsequent encounter: Secondary | ICD-10-CM | POA: Diagnosis not present

## 2021-10-09 DIAGNOSIS — D62 Acute posthemorrhagic anemia: Secondary | ICD-10-CM | POA: Diagnosis not present

## 2021-10-09 DIAGNOSIS — I509 Heart failure, unspecified: Secondary | ICD-10-CM | POA: Diagnosis not present

## 2021-10-09 DIAGNOSIS — N186 End stage renal disease: Secondary | ICD-10-CM | POA: Diagnosis not present

## 2021-10-09 DIAGNOSIS — K219 Gastro-esophageal reflux disease without esophagitis: Secondary | ICD-10-CM | POA: Diagnosis not present

## 2021-10-09 DIAGNOSIS — I70222 Atherosclerosis of native arteries of extremities with rest pain, left leg: Secondary | ICD-10-CM | POA: Diagnosis not present

## 2021-10-09 DIAGNOSIS — F32A Depression, unspecified: Secondary | ICD-10-CM | POA: Diagnosis not present

## 2021-10-09 DIAGNOSIS — E1140 Type 2 diabetes mellitus with diabetic neuropathy, unspecified: Secondary | ICD-10-CM | POA: Diagnosis not present

## 2021-10-09 DIAGNOSIS — Z7982 Long term (current) use of aspirin: Secondary | ICD-10-CM | POA: Diagnosis not present

## 2021-10-09 DIAGNOSIS — E875 Hyperkalemia: Secondary | ICD-10-CM | POA: Diagnosis not present

## 2021-10-09 DIAGNOSIS — I252 Old myocardial infarction: Secondary | ICD-10-CM | POA: Diagnosis not present

## 2021-10-09 DIAGNOSIS — R1311 Dysphagia, oral phase: Secondary | ICD-10-CM | POA: Diagnosis not present

## 2021-10-09 DIAGNOSIS — Z794 Long term (current) use of insulin: Secondary | ICD-10-CM | POA: Diagnosis not present

## 2021-10-09 DIAGNOSIS — E1165 Type 2 diabetes mellitus with hyperglycemia: Secondary | ICD-10-CM | POA: Diagnosis not present

## 2021-10-09 DIAGNOSIS — E1151 Type 2 diabetes mellitus with diabetic peripheral angiopathy without gangrene: Secondary | ICD-10-CM | POA: Diagnosis not present

## 2021-10-09 DIAGNOSIS — I051 Rheumatic mitral insufficiency: Secondary | ICD-10-CM | POA: Diagnosis not present

## 2021-10-09 DIAGNOSIS — G3184 Mild cognitive impairment, so stated: Secondary | ICD-10-CM | POA: Diagnosis not present

## 2021-10-09 NOTE — Telephone Encounter (Signed)
Received call from Kandis Cocking., OT with [facility]. Requesting VO for HH OT 1 week 4 to work on grip strength and functional ADLs. Verbal auth given. Will route to [facility] for agreement/denial.

## 2021-10-10 DIAGNOSIS — N2581 Secondary hyperparathyroidism of renal origin: Secondary | ICD-10-CM | POA: Diagnosis not present

## 2021-10-10 DIAGNOSIS — Z23 Encounter for immunization: Secondary | ICD-10-CM | POA: Diagnosis not present

## 2021-10-10 DIAGNOSIS — D688 Other specified coagulation defects: Secondary | ICD-10-CM | POA: Diagnosis not present

## 2021-10-10 DIAGNOSIS — D689 Coagulation defect, unspecified: Secondary | ICD-10-CM | POA: Diagnosis not present

## 2021-10-10 DIAGNOSIS — T8249XD Other complication of vascular dialysis catheter, subsequent encounter: Secondary | ICD-10-CM | POA: Diagnosis not present

## 2021-10-10 DIAGNOSIS — Z992 Dependence on renal dialysis: Secondary | ICD-10-CM | POA: Diagnosis not present

## 2021-10-10 DIAGNOSIS — N186 End stage renal disease: Secondary | ICD-10-CM | POA: Diagnosis not present

## 2021-10-13 DIAGNOSIS — D689 Coagulation defect, unspecified: Secondary | ICD-10-CM | POA: Diagnosis not present

## 2021-10-13 DIAGNOSIS — N186 End stage renal disease: Secondary | ICD-10-CM | POA: Diagnosis not present

## 2021-10-13 DIAGNOSIS — D688 Other specified coagulation defects: Secondary | ICD-10-CM | POA: Diagnosis not present

## 2021-10-13 DIAGNOSIS — N2581 Secondary hyperparathyroidism of renal origin: Secondary | ICD-10-CM | POA: Diagnosis not present

## 2021-10-13 DIAGNOSIS — Z23 Encounter for immunization: Secondary | ICD-10-CM | POA: Diagnosis not present

## 2021-10-13 DIAGNOSIS — Z992 Dependence on renal dialysis: Secondary | ICD-10-CM | POA: Diagnosis not present

## 2021-10-13 DIAGNOSIS — T8249XD Other complication of vascular dialysis catheter, subsequent encounter: Secondary | ICD-10-CM | POA: Diagnosis not present

## 2021-10-14 ENCOUNTER — Other Ambulatory Visit (HOSPITAL_COMMUNITY): Payer: Self-pay

## 2021-10-14 DIAGNOSIS — Z992 Dependence on renal dialysis: Secondary | ICD-10-CM | POA: Diagnosis not present

## 2021-10-14 DIAGNOSIS — N186 End stage renal disease: Secondary | ICD-10-CM | POA: Diagnosis not present

## 2021-10-14 DIAGNOSIS — I700 Atherosclerosis of aorta: Secondary | ICD-10-CM | POA: Diagnosis not present

## 2021-10-14 DIAGNOSIS — E1140 Type 2 diabetes mellitus with diabetic neuropathy, unspecified: Secondary | ICD-10-CM | POA: Diagnosis not present

## 2021-10-14 DIAGNOSIS — N3001 Acute cystitis with hematuria: Secondary | ICD-10-CM | POA: Diagnosis not present

## 2021-10-14 DIAGNOSIS — D62 Acute posthemorrhagic anemia: Secondary | ICD-10-CM | POA: Diagnosis not present

## 2021-10-14 DIAGNOSIS — Z794 Long term (current) use of insulin: Secondary | ICD-10-CM | POA: Diagnosis not present

## 2021-10-14 DIAGNOSIS — I509 Heart failure, unspecified: Secondary | ICD-10-CM | POA: Diagnosis not present

## 2021-10-14 DIAGNOSIS — I110 Hypertensive heart disease with heart failure: Secondary | ICD-10-CM | POA: Diagnosis not present

## 2021-10-14 DIAGNOSIS — I70222 Atherosclerosis of native arteries of extremities with rest pain, left leg: Secondary | ICD-10-CM | POA: Diagnosis not present

## 2021-10-14 DIAGNOSIS — E1122 Type 2 diabetes mellitus with diabetic chronic kidney disease: Secondary | ICD-10-CM | POA: Diagnosis not present

## 2021-10-14 DIAGNOSIS — G3184 Mild cognitive impairment, so stated: Secondary | ICD-10-CM | POA: Diagnosis not present

## 2021-10-14 DIAGNOSIS — S71111D Laceration without foreign body, right thigh, subsequent encounter: Secondary | ICD-10-CM | POA: Diagnosis not present

## 2021-10-14 DIAGNOSIS — Z7982 Long term (current) use of aspirin: Secondary | ICD-10-CM | POA: Diagnosis not present

## 2021-10-14 DIAGNOSIS — E1165 Type 2 diabetes mellitus with hyperglycemia: Secondary | ICD-10-CM | POA: Diagnosis not present

## 2021-10-14 DIAGNOSIS — F32A Depression, unspecified: Secondary | ICD-10-CM | POA: Diagnosis not present

## 2021-10-14 DIAGNOSIS — R1311 Dysphagia, oral phase: Secondary | ICD-10-CM | POA: Diagnosis not present

## 2021-10-14 DIAGNOSIS — E1151 Type 2 diabetes mellitus with diabetic peripheral angiopathy without gangrene: Secondary | ICD-10-CM | POA: Diagnosis not present

## 2021-10-14 DIAGNOSIS — F17210 Nicotine dependence, cigarettes, uncomplicated: Secondary | ICD-10-CM | POA: Diagnosis not present

## 2021-10-14 DIAGNOSIS — I051 Rheumatic mitral insufficiency: Secondary | ICD-10-CM | POA: Diagnosis not present

## 2021-10-14 DIAGNOSIS — Z993 Dependence on wheelchair: Secondary | ICD-10-CM | POA: Diagnosis not present

## 2021-10-14 DIAGNOSIS — E875 Hyperkalemia: Secondary | ICD-10-CM | POA: Diagnosis not present

## 2021-10-14 DIAGNOSIS — K219 Gastro-esophageal reflux disease without esophagitis: Secondary | ICD-10-CM | POA: Diagnosis not present

## 2021-10-14 DIAGNOSIS — E785 Hyperlipidemia, unspecified: Secondary | ICD-10-CM | POA: Diagnosis not present

## 2021-10-14 DIAGNOSIS — I252 Old myocardial infarction: Secondary | ICD-10-CM | POA: Diagnosis not present

## 2021-10-16 ENCOUNTER — Telehealth: Payer: Self-pay

## 2021-10-16 DIAGNOSIS — R1311 Dysphagia, oral phase: Secondary | ICD-10-CM | POA: Diagnosis not present

## 2021-10-16 DIAGNOSIS — E785 Hyperlipidemia, unspecified: Secondary | ICD-10-CM | POA: Diagnosis not present

## 2021-10-16 DIAGNOSIS — F17210 Nicotine dependence, cigarettes, uncomplicated: Secondary | ICD-10-CM | POA: Diagnosis not present

## 2021-10-16 DIAGNOSIS — E875 Hyperkalemia: Secondary | ICD-10-CM | POA: Diagnosis not present

## 2021-10-16 DIAGNOSIS — I051 Rheumatic mitral insufficiency: Secondary | ICD-10-CM | POA: Diagnosis not present

## 2021-10-16 DIAGNOSIS — Z992 Dependence on renal dialysis: Secondary | ICD-10-CM | POA: Diagnosis not present

## 2021-10-16 DIAGNOSIS — E1122 Type 2 diabetes mellitus with diabetic chronic kidney disease: Secondary | ICD-10-CM | POA: Diagnosis not present

## 2021-10-16 DIAGNOSIS — I700 Atherosclerosis of aorta: Secondary | ICD-10-CM | POA: Diagnosis not present

## 2021-10-16 DIAGNOSIS — F32A Depression, unspecified: Secondary | ICD-10-CM | POA: Diagnosis not present

## 2021-10-16 DIAGNOSIS — E1140 Type 2 diabetes mellitus with diabetic neuropathy, unspecified: Secondary | ICD-10-CM | POA: Diagnosis not present

## 2021-10-16 DIAGNOSIS — I252 Old myocardial infarction: Secondary | ICD-10-CM | POA: Diagnosis not present

## 2021-10-16 DIAGNOSIS — N3001 Acute cystitis with hematuria: Secondary | ICD-10-CM | POA: Diagnosis not present

## 2021-10-16 DIAGNOSIS — I70222 Atherosclerosis of native arteries of extremities with rest pain, left leg: Secondary | ICD-10-CM | POA: Diagnosis not present

## 2021-10-16 DIAGNOSIS — Z993 Dependence on wheelchair: Secondary | ICD-10-CM | POA: Diagnosis not present

## 2021-10-16 DIAGNOSIS — K219 Gastro-esophageal reflux disease without esophagitis: Secondary | ICD-10-CM | POA: Diagnosis not present

## 2021-10-16 DIAGNOSIS — G3184 Mild cognitive impairment, so stated: Secondary | ICD-10-CM | POA: Diagnosis not present

## 2021-10-16 DIAGNOSIS — E1165 Type 2 diabetes mellitus with hyperglycemia: Secondary | ICD-10-CM | POA: Diagnosis not present

## 2021-10-16 DIAGNOSIS — E1151 Type 2 diabetes mellitus with diabetic peripheral angiopathy without gangrene: Secondary | ICD-10-CM | POA: Diagnosis not present

## 2021-10-16 DIAGNOSIS — S71111D Laceration without foreign body, right thigh, subsequent encounter: Secondary | ICD-10-CM | POA: Diagnosis not present

## 2021-10-16 DIAGNOSIS — Z794 Long term (current) use of insulin: Secondary | ICD-10-CM | POA: Diagnosis not present

## 2021-10-16 DIAGNOSIS — Z7982 Long term (current) use of aspirin: Secondary | ICD-10-CM | POA: Diagnosis not present

## 2021-10-16 DIAGNOSIS — D62 Acute posthemorrhagic anemia: Secondary | ICD-10-CM | POA: Diagnosis not present

## 2021-10-16 DIAGNOSIS — I110 Hypertensive heart disease with heart failure: Secondary | ICD-10-CM | POA: Diagnosis not present

## 2021-10-16 DIAGNOSIS — I509 Heart failure, unspecified: Secondary | ICD-10-CM | POA: Diagnosis not present

## 2021-10-16 DIAGNOSIS — N186 End stage renal disease: Secondary | ICD-10-CM | POA: Diagnosis not present

## 2021-10-16 NOTE — Telephone Encounter (Signed)
Pt's sister, [name], called stating the pt was having hand circulation issues.  Reviewed pt's chart, returned call for clarification, two identifiers used. Pt had not been seen since his ligation in August for any f/u. Pt stated that his hand aches, hand is cold and can't get warm, and his fingers have turned black at the tips. Worked with his aunt, Arelia Sneddon, to get appt scheduled to work with HD schedule and [facility] transportation. Confirmed understanding.

## 2021-10-19 ENCOUNTER — Ambulatory Visit (INDEPENDENT_AMBULATORY_CARE_PROVIDER_SITE_OTHER): Payer: Medicare Other | Admitting: Physician Assistant

## 2021-10-19 ENCOUNTER — Encounter: Payer: Self-pay | Admitting: Physician Assistant

## 2021-10-19 VITALS — BP 146/84 | HR 82 | Temp 97.3°F | Resp 16

## 2021-10-19 DIAGNOSIS — E1142 Type 2 diabetes mellitus with diabetic polyneuropathy: Secondary | ICD-10-CM

## 2021-10-19 DIAGNOSIS — I739 Peripheral vascular disease, unspecified: Secondary | ICD-10-CM

## 2021-10-19 NOTE — Progress Notes (Signed)
POST OPERATIVE OFFICE NOTE    CC:  F/u for surgery  HPI:  [name] is a 61 y.o. male who is here for a triage visit. 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 He recently required L AKA by Dr.Duda on 08/12/2021 for ischemic L foot wounds with no options for revascularization.   During his hospitalization on 08/12/2021, the patient started developing gangrenous changes to bilateral fingertips, L>R. He has a history of left ulnar basilic AV fistula creation by Dr. Stanford Breed on 07/02/2021.  Work-up in the hospital did not reveal a central source to the patient's bilateral fingertip ulcerations.  He underwent bilateral upper extremity arteriograms and left AV fistula ligation on 08/18/2021.  Bilateral arteriograms showed small vessels through the wrist and hands, but no correctable hemodynamically significant stenosis.  It was hopeful that ligation of his left AV fistula would prevent his ulcerations from getting worse.  He was to follow-up with [place] postop in 3 weeks.  Pt returns today for follow up.  Pt states the gangrenous changes to his fingertips bilaterally have gone away.  He still has some scattered ulcerations on bilateral knuckles that have scabbed over.  He has been experiencing persistent hand numbness, coldness, tingling going up his forearms, and pains in his fingertips since his procedures 2 months ago.  He will occasionally do warm water soaks for his hands to warm them up.  He states that he is also constantly wearing gloves to keep his hands warm.  He is currently dialyzing on TTS via right IJ TDC, placed by IR. Allergies  Allergen Reactions   Benicar [Olmesartan] Cough    Current Outpatient Medications  Medication Sig Dispense Refill   Accu-Chek FastClix Lancets MISC Check blood sugar 3 (three) times daily. 102 each 12   acetaminophen (TYLENOL) 325 MG tablet Take 2 tablets (650 mg total) by mouth every 4 (four) hours as needed for mild pain or moderate  pain.     acetaminophen (TYLENOL) 650 MG CR tablet Take 2 tablets (1,300 mg total) by mouth every 6 (six) hours as needed for pain 240 tablet 0   amLODipine (NORVASC) 10 MG tablet Take 1 tablet (10 mg total) by mouth daily. (Patient not taking: Reported on 08/10/2021) 90 tablet 2   amLODipine (NORVASC) 10 MG tablet Take 1 tablet (10 mg total) by mouth daily for hypertension 30 tablet 0   aspirin EC 81 MG tablet Take 1 tablet (81 mg total) by mouth daily. Swallow whole. (Patient not taking: Reported on 07/02/2021) 30 tablet 0   aspirin EC 81 MG tablet Take 1 tablet (81 mg total) by mouth daily. 30 tablet 0   blood glucose meter kit and supplies KIT Dispense based on patient and insurance preference. Use up to four times daily as directed. (FOR ICD-9 250.00, 250.01). 1 each 0   Blood Glucose Monitoring Suppl (ACCU-CHEK AVIVA PLUS) w/Device KIT Check blood sugar 3 times a day 1 kit 0   Blood Glucose Monitoring Suppl (BLOOD GLUCOSE MONITOR SYSTEM) w/Device KIT Use to check blood sugar 3 (three) times daily. 1 kit 0   carvedilol (COREG) 6.25 MG tablet Take 1 tablet (6.25 mg total) by mouth 2 (two) times daily with a meal. (Patient not taking: Reported on 08/10/2021) 180 tablet 1   carvedilol (COREG) 6.25 MG tablet Take 1 tablet (6.25 mg total) by mouth 2 (two) times daily with meals for hypertension 60 tablet 0   cetirizine (  ZYRTEC ALLERGY) 10 MG tablet Take 1 tablet (10 mg total) by mouth daily. (Patient not taking: Reported on 07/02/2021) 30 tablet 2   cetirizine (ZYRTEC) 10 MG tablet Take 1 tablet (10 mg total) by mouth daily for allergic rhinitis 30 tablet 0   Continuous Blood Gluc Sensor (FREESTYLE LIBRE 2 SENSOR) MISC Use as directed every 14 days 2 each 0   Continuous Blood Gluc Sensor (FREESTYLE LIBRE 2 SENSOR) MISC Place 1 sensor on the skin every 14 days. Use to check glucose continuously 2 each 3   Darbepoetin Alfa (ARANESP) 100 MCG/0.5ML SOSY injection Inject 0.5 mLs (100 mcg total) into the vein  every Wednesday with hemodialysis. 4.2 mL    DULoxetine (CYMBALTA) 30 MG capsule Take 2 capsules (60 mg total) by mouth daily. (Patient not taking: Reported on 08/10/2021) 180 capsule 1   DULoxetine (CYMBALTA) 60 MG capsule Take 1 capsule (60 mg total) by mouth daily for depression (Do NOT CRUSH) 30 capsule 0   ezetimibe (ZETIA) 10 MG tablet Take 1 tablet (10 mg total) by mouth daily. (Patient not taking: Reported on 08/10/2021) 30 tablet 0   ezetimibe (ZETIA) 10 MG tablet Take 1 tablet (10 mg total) by mouth daily. 30 tablet 0   fluticasone (FLONASE) 50 MCG/ACT nasal spray Place 1 spray into both nostrils daily. (Patient not taking: Reported on 08/10/2021) 16 g 0   fluticasone (FLONASE) 50 MCG/ACT nasal spray Place 1 spray into both nostrils daily for allergic rhinitis 16 g 0   glucose blood (ACCU-CHEK AVIVA PLUS) test strip Check blood sugar 3 times a day 100 each 12   insulin aspart (NOVOLOG) 100 UNIT/ML FlexPen Inject 11 Units into the skin 3 (three) times daily with meals. (Patient not taking: Reported on 08/10/2021) 15 mL 4   insulin aspart (NOVOLOG) 100 UNIT/ML FlexPen Inject 5 Units into the skin 3 (three) times daily with meals. 15 mL 4   insulin glargine (LANTUS) 100 UNIT/ML Solostar Pen Inject 16 Units into the skin at bedtime. (PRIME PEN WITH 2 UNITS PRIOR TO EACH USE) 3 mL 0   insulin glargine-yfgn (SEMGLEE) 100 UNIT/ML Pen Inject 10 Units into the skin at bedtime. 3 mL 3   Insulin Pen Needle 32G X 4 MM MISC Use to inject insulin 4 (four) times daily. 360 each 3   isosorbide mononitrate (IMDUR) 30 MG 24 hr tablet Take 0.5 tablets (15 mg total) by mouth daily for ischemic cardiomyopathy 15 tablet 0   melatonin (KP MELATONIN) 3 MG TABS tablet Take 1 tablet (3 mg total) by mouth at bedtime for insomnia. 30 tablet 0   oxyCODONE (OXY IR/ROXICODONE) 5 MG immediate release tablet Take 1 tablet (5 mg total) by mouth every 6 (six) hours as needed for pain 120 tablet 0   pantoprazole (PROTONIX) 40 MG  tablet Take 1 tablet by mouth once a day 30 tablet 11   pantoprazole (PROTONIX) 40 MG tablet Take 1 tablet (40 mg total) by mouth daily for GERD. 30 tablet 0   polyethylene glycol powder (GLYCOLAX/MIRALAX) 17 GM/SCOOP powder Take 17 g by mouth daily. 510 g 0   pregabalin (LYRICA) 25 MG capsule Take 1 capsule (25 mg total) by mouth daily. (Patient not taking: Reported on 08/10/2021) 90 capsule 1   pregabalin (LYRICA) 25 MG capsule Take 1 capsule (25 mg total) by mouth daily. 30 capsule 0   rosuvastatin (CRESTOR) 10 MG tablet Take 1 tablet (10 mg total) by mouth daily. (Patient not taking: Reported on 08/10/2021) 30  tablet 0   rosuvastatin (CRESTOR) 10 MG tablet Take 1 tablet (10 mg total) by mouth daily for HLD 30 tablet 0   senna-docusate (SENEXON-S) 8.6-50 MG tablet Take 1 tablet by mouth at bedtime for constipation 30 tablet 0   senna-docusate (SENOKOT-S) 8.6-50 MG tablet Take 1 tablet by mouth at bedtime. (Patient not taking: Reported on 08/10/2021) 12 tablet 0   No current facility-administered medications for this visit.     ROS:  See HPI  Physical Exam:  Incision: Left upper arm incisions well-healed. Extremities: No gangrenous changes to fingertips/hands bilaterally. 2-3 scattered ulcerations on his knuckles bilaterally that have scabbed over.  Hands are cool to the touch.  Brisk radial doppler signals bilaterally.  Soft ulnar and deep palmar doppler signals bilaterally. Neuro: Intact motor and sensation.  Grip strength 5 out of 5 bilaterally   Assessment/Plan:  This is a 61 y.o. male who is s/p: Bilateral upper extremity arteriogram and ligation of left AV fistula on 08/18/2021   -His left arm incisions are well-healed.  He is still dialyzing -Bilateral upper extremity arteriograms during his hospitalization showed small vessels in his forearms and hands bilaterally.  He had no areas of hemodynamically significant stenosis -He has brisk radial Doppler signals bilaterally.  He has soft  ulnar and deep palmar doppler signals bilaterally -He is still experiencing some hand coldness and pains, most likely mixed due to small vessel disease and diabetic neuropathy.  I have explained to him that there is currently nothing for [place] to do from a vascular standpoint.  He should keep his hands protected, and he can continue to keep his hands warm with gloves and/or warm water soaks.  -He currently takes pregabalin.  I have educated him that if he is interested, his PCP could potentially get this medication increased or switched to something else to help with his neuropathy -He can follow up with [place] as needed   Vicente Serene, PA-C [facility] [phone]   Clinic MD: Virl Cagey on-call MD

## 2021-10-20 ENCOUNTER — Other Ambulatory Visit (HOSPITAL_COMMUNITY): Payer: Self-pay

## 2021-10-20 DIAGNOSIS — Z992 Dependence on renal dialysis: Secondary | ICD-10-CM | POA: Diagnosis not present

## 2021-10-20 DIAGNOSIS — T8249XD Other complication of vascular dialysis catheter, subsequent encounter: Secondary | ICD-10-CM | POA: Diagnosis not present

## 2021-10-20 DIAGNOSIS — D688 Other specified coagulation defects: Secondary | ICD-10-CM | POA: Diagnosis not present

## 2021-10-20 DIAGNOSIS — N2581 Secondary hyperparathyroidism of renal origin: Secondary | ICD-10-CM | POA: Diagnosis not present

## 2021-10-20 DIAGNOSIS — D689 Coagulation defect, unspecified: Secondary | ICD-10-CM | POA: Diagnosis not present

## 2021-10-20 DIAGNOSIS — N186 End stage renal disease: Secondary | ICD-10-CM | POA: Diagnosis not present

## 2021-10-20 DIAGNOSIS — Z23 Encounter for immunization: Secondary | ICD-10-CM | POA: Diagnosis not present

## 2021-10-21 ENCOUNTER — Ambulatory Visit: Payer: Self-pay

## 2021-10-21 DIAGNOSIS — I70222 Atherosclerosis of native arteries of extremities with rest pain, left leg: Secondary | ICD-10-CM | POA: Diagnosis not present

## 2021-10-21 DIAGNOSIS — R1311 Dysphagia, oral phase: Secondary | ICD-10-CM | POA: Diagnosis not present

## 2021-10-21 DIAGNOSIS — Z794 Long term (current) use of insulin: Secondary | ICD-10-CM | POA: Diagnosis not present

## 2021-10-21 DIAGNOSIS — I700 Atherosclerosis of aorta: Secondary | ICD-10-CM | POA: Diagnosis not present

## 2021-10-21 DIAGNOSIS — E875 Hyperkalemia: Secondary | ICD-10-CM | POA: Diagnosis not present

## 2021-10-21 DIAGNOSIS — I051 Rheumatic mitral insufficiency: Secondary | ICD-10-CM | POA: Diagnosis not present

## 2021-10-21 DIAGNOSIS — D62 Acute posthemorrhagic anemia: Secondary | ICD-10-CM | POA: Diagnosis not present

## 2021-10-21 DIAGNOSIS — N3001 Acute cystitis with hematuria: Secondary | ICD-10-CM | POA: Diagnosis not present

## 2021-10-21 DIAGNOSIS — E785 Hyperlipidemia, unspecified: Secondary | ICD-10-CM | POA: Diagnosis not present

## 2021-10-21 DIAGNOSIS — F17210 Nicotine dependence, cigarettes, uncomplicated: Secondary | ICD-10-CM | POA: Diagnosis not present

## 2021-10-21 DIAGNOSIS — Z993 Dependence on wheelchair: Secondary | ICD-10-CM | POA: Diagnosis not present

## 2021-10-21 DIAGNOSIS — K219 Gastro-esophageal reflux disease without esophagitis: Secondary | ICD-10-CM | POA: Diagnosis not present

## 2021-10-21 DIAGNOSIS — E1140 Type 2 diabetes mellitus with diabetic neuropathy, unspecified: Secondary | ICD-10-CM | POA: Diagnosis not present

## 2021-10-21 DIAGNOSIS — I509 Heart failure, unspecified: Secondary | ICD-10-CM | POA: Diagnosis not present

## 2021-10-21 DIAGNOSIS — Z992 Dependence on renal dialysis: Secondary | ICD-10-CM | POA: Diagnosis not present

## 2021-10-21 DIAGNOSIS — S71111D Laceration without foreign body, right thigh, subsequent encounter: Secondary | ICD-10-CM | POA: Diagnosis not present

## 2021-10-21 DIAGNOSIS — F32A Depression, unspecified: Secondary | ICD-10-CM | POA: Diagnosis not present

## 2021-10-21 DIAGNOSIS — I110 Hypertensive heart disease with heart failure: Secondary | ICD-10-CM | POA: Diagnosis not present

## 2021-10-21 DIAGNOSIS — I252 Old myocardial infarction: Secondary | ICD-10-CM | POA: Diagnosis not present

## 2021-10-21 DIAGNOSIS — Z7982 Long term (current) use of aspirin: Secondary | ICD-10-CM | POA: Diagnosis not present

## 2021-10-21 DIAGNOSIS — E1151 Type 2 diabetes mellitus with diabetic peripheral angiopathy without gangrene: Secondary | ICD-10-CM | POA: Diagnosis not present

## 2021-10-21 DIAGNOSIS — N186 End stage renal disease: Secondary | ICD-10-CM | POA: Diagnosis not present

## 2021-10-21 DIAGNOSIS — E1165 Type 2 diabetes mellitus with hyperglycemia: Secondary | ICD-10-CM | POA: Diagnosis not present

## 2021-10-21 DIAGNOSIS — G3184 Mild cognitive impairment, so stated: Secondary | ICD-10-CM | POA: Diagnosis not present

## 2021-10-21 DIAGNOSIS — E1122 Type 2 diabetes mellitus with diabetic chronic kidney disease: Secondary | ICD-10-CM | POA: Diagnosis not present

## 2021-10-21 NOTE — Patient Outreach (Signed)
  Care Coordination   10/21/2021 Name: [name] MRN: [id number] DOB: 06/30/1960   Care Coordination Outreach Attempts:  A second unsuccessful outreach was attempted today to offer the patient with information about available care coordination services as a benefit of their health plan.     Follow Up Plan:  Additional outreach attempts will be made to offer the patient care coordination information and services.   Encounter Outcome:  No Answer  Care Coordination Interventions Activated:  No   Care Coordination Interventions:  No, not indicated    Lazaro Arms RN, BSN, [facility]   Phone: [phone]

## 2021-10-22 ENCOUNTER — Other Ambulatory Visit (HOSPITAL_COMMUNITY): Payer: Self-pay

## 2021-10-22 DIAGNOSIS — Z23 Encounter for immunization: Secondary | ICD-10-CM | POA: Diagnosis not present

## 2021-10-22 DIAGNOSIS — N2581 Secondary hyperparathyroidism of renal origin: Secondary | ICD-10-CM | POA: Diagnosis not present

## 2021-10-22 DIAGNOSIS — D688 Other specified coagulation defects: Secondary | ICD-10-CM | POA: Diagnosis not present

## 2021-10-22 DIAGNOSIS — Z992 Dependence on renal dialysis: Secondary | ICD-10-CM | POA: Diagnosis not present

## 2021-10-22 DIAGNOSIS — N186 End stage renal disease: Secondary | ICD-10-CM | POA: Diagnosis not present

## 2021-10-22 DIAGNOSIS — D689 Coagulation defect, unspecified: Secondary | ICD-10-CM | POA: Diagnosis not present

## 2021-10-22 DIAGNOSIS — T8249XD Other complication of vascular dialysis catheter, subsequent encounter: Secondary | ICD-10-CM | POA: Diagnosis not present

## 2021-10-23 ENCOUNTER — Other Ambulatory Visit (HOSPITAL_COMMUNITY): Payer: Self-pay

## 2021-10-23 ENCOUNTER — Other Ambulatory Visit (HOSPITAL_COMMUNITY): Payer: Medicare Other

## 2021-10-23 DIAGNOSIS — E875 Hyperkalemia: Secondary | ICD-10-CM | POA: Diagnosis not present

## 2021-10-23 DIAGNOSIS — N186 End stage renal disease: Secondary | ICD-10-CM | POA: Diagnosis not present

## 2021-10-23 DIAGNOSIS — I700 Atherosclerosis of aorta: Secondary | ICD-10-CM | POA: Diagnosis not present

## 2021-10-23 DIAGNOSIS — Z992 Dependence on renal dialysis: Secondary | ICD-10-CM | POA: Diagnosis not present

## 2021-10-23 DIAGNOSIS — F17210 Nicotine dependence, cigarettes, uncomplicated: Secondary | ICD-10-CM | POA: Diagnosis not present

## 2021-10-23 DIAGNOSIS — E1165 Type 2 diabetes mellitus with hyperglycemia: Secondary | ICD-10-CM | POA: Diagnosis not present

## 2021-10-23 DIAGNOSIS — E1140 Type 2 diabetes mellitus with diabetic neuropathy, unspecified: Secondary | ICD-10-CM | POA: Diagnosis not present

## 2021-10-23 DIAGNOSIS — Z7982 Long term (current) use of aspirin: Secondary | ICD-10-CM | POA: Diagnosis not present

## 2021-10-23 DIAGNOSIS — I509 Heart failure, unspecified: Secondary | ICD-10-CM | POA: Diagnosis not present

## 2021-10-23 DIAGNOSIS — I110 Hypertensive heart disease with heart failure: Secondary | ICD-10-CM | POA: Diagnosis not present

## 2021-10-23 DIAGNOSIS — Z993 Dependence on wheelchair: Secondary | ICD-10-CM | POA: Diagnosis not present

## 2021-10-23 DIAGNOSIS — I70222 Atherosclerosis of native arteries of extremities with rest pain, left leg: Secondary | ICD-10-CM | POA: Diagnosis not present

## 2021-10-23 DIAGNOSIS — S71111D Laceration without foreign body, right thigh, subsequent encounter: Secondary | ICD-10-CM | POA: Diagnosis not present

## 2021-10-23 DIAGNOSIS — N3001 Acute cystitis with hematuria: Secondary | ICD-10-CM | POA: Diagnosis not present

## 2021-10-23 DIAGNOSIS — G3184 Mild cognitive impairment, so stated: Secondary | ICD-10-CM | POA: Diagnosis not present

## 2021-10-23 DIAGNOSIS — E1151 Type 2 diabetes mellitus with diabetic peripheral angiopathy without gangrene: Secondary | ICD-10-CM | POA: Diagnosis not present

## 2021-10-23 DIAGNOSIS — K219 Gastro-esophageal reflux disease without esophagitis: Secondary | ICD-10-CM | POA: Diagnosis not present

## 2021-10-23 DIAGNOSIS — Z794 Long term (current) use of insulin: Secondary | ICD-10-CM | POA: Diagnosis not present

## 2021-10-23 DIAGNOSIS — E1122 Type 2 diabetes mellitus with diabetic chronic kidney disease: Secondary | ICD-10-CM | POA: Diagnosis not present

## 2021-10-23 DIAGNOSIS — I051 Rheumatic mitral insufficiency: Secondary | ICD-10-CM | POA: Diagnosis not present

## 2021-10-23 DIAGNOSIS — R1311 Dysphagia, oral phase: Secondary | ICD-10-CM | POA: Diagnosis not present

## 2021-10-23 DIAGNOSIS — D62 Acute posthemorrhagic anemia: Secondary | ICD-10-CM | POA: Diagnosis not present

## 2021-10-23 DIAGNOSIS — I252 Old myocardial infarction: Secondary | ICD-10-CM | POA: Diagnosis not present

## 2021-10-23 DIAGNOSIS — E785 Hyperlipidemia, unspecified: Secondary | ICD-10-CM | POA: Diagnosis not present

## 2021-10-23 DIAGNOSIS — F32A Depression, unspecified: Secondary | ICD-10-CM | POA: Diagnosis not present

## 2021-10-27 DIAGNOSIS — Z23 Encounter for immunization: Secondary | ICD-10-CM | POA: Diagnosis not present

## 2021-10-27 DIAGNOSIS — N2581 Secondary hyperparathyroidism of renal origin: Secondary | ICD-10-CM | POA: Diagnosis not present

## 2021-10-27 DIAGNOSIS — D689 Coagulation defect, unspecified: Secondary | ICD-10-CM | POA: Diagnosis not present

## 2021-10-27 DIAGNOSIS — D688 Other specified coagulation defects: Secondary | ICD-10-CM | POA: Diagnosis not present

## 2021-10-27 DIAGNOSIS — Z992 Dependence on renal dialysis: Secondary | ICD-10-CM | POA: Diagnosis not present

## 2021-10-27 DIAGNOSIS — N186 End stage renal disease: Secondary | ICD-10-CM | POA: Diagnosis not present

## 2021-10-27 DIAGNOSIS — T8249XD Other complication of vascular dialysis catheter, subsequent encounter: Secondary | ICD-10-CM | POA: Diagnosis not present

## 2021-10-28 ENCOUNTER — Other Ambulatory Visit (HOSPITAL_COMMUNITY): Payer: Self-pay

## 2021-10-28 DIAGNOSIS — Z7982 Long term (current) use of aspirin: Secondary | ICD-10-CM | POA: Diagnosis not present

## 2021-10-28 DIAGNOSIS — E875 Hyperkalemia: Secondary | ICD-10-CM | POA: Diagnosis not present

## 2021-10-28 DIAGNOSIS — I509 Heart failure, unspecified: Secondary | ICD-10-CM | POA: Diagnosis not present

## 2021-10-28 DIAGNOSIS — K219 Gastro-esophageal reflux disease without esophagitis: Secondary | ICD-10-CM | POA: Diagnosis not present

## 2021-10-28 DIAGNOSIS — Z794 Long term (current) use of insulin: Secondary | ICD-10-CM | POA: Diagnosis not present

## 2021-10-28 DIAGNOSIS — I110 Hypertensive heart disease with heart failure: Secondary | ICD-10-CM | POA: Diagnosis not present

## 2021-10-28 DIAGNOSIS — E1165 Type 2 diabetes mellitus with hyperglycemia: Secondary | ICD-10-CM | POA: Diagnosis not present

## 2021-10-28 DIAGNOSIS — F17210 Nicotine dependence, cigarettes, uncomplicated: Secondary | ICD-10-CM | POA: Diagnosis not present

## 2021-10-28 DIAGNOSIS — G3184 Mild cognitive impairment, so stated: Secondary | ICD-10-CM | POA: Diagnosis not present

## 2021-10-28 DIAGNOSIS — E1140 Type 2 diabetes mellitus with diabetic neuropathy, unspecified: Secondary | ICD-10-CM | POA: Diagnosis not present

## 2021-10-28 DIAGNOSIS — R1311 Dysphagia, oral phase: Secondary | ICD-10-CM | POA: Diagnosis not present

## 2021-10-28 DIAGNOSIS — Z992 Dependence on renal dialysis: Secondary | ICD-10-CM | POA: Diagnosis not present

## 2021-10-28 DIAGNOSIS — S71111D Laceration without foreign body, right thigh, subsequent encounter: Secondary | ICD-10-CM | POA: Diagnosis not present

## 2021-10-28 DIAGNOSIS — N186 End stage renal disease: Secondary | ICD-10-CM | POA: Diagnosis not present

## 2021-10-28 DIAGNOSIS — D62 Acute posthemorrhagic anemia: Secondary | ICD-10-CM | POA: Diagnosis not present

## 2021-10-28 DIAGNOSIS — I700 Atherosclerosis of aorta: Secondary | ICD-10-CM | POA: Diagnosis not present

## 2021-10-28 DIAGNOSIS — I051 Rheumatic mitral insufficiency: Secondary | ICD-10-CM | POA: Diagnosis not present

## 2021-10-28 DIAGNOSIS — I70222 Atherosclerosis of native arteries of extremities with rest pain, left leg: Secondary | ICD-10-CM | POA: Diagnosis not present

## 2021-10-28 DIAGNOSIS — F32A Depression, unspecified: Secondary | ICD-10-CM | POA: Diagnosis not present

## 2021-10-28 DIAGNOSIS — E1151 Type 2 diabetes mellitus with diabetic peripheral angiopathy without gangrene: Secondary | ICD-10-CM | POA: Diagnosis not present

## 2021-10-28 DIAGNOSIS — N3001 Acute cystitis with hematuria: Secondary | ICD-10-CM | POA: Diagnosis not present

## 2021-10-28 DIAGNOSIS — I252 Old myocardial infarction: Secondary | ICD-10-CM | POA: Diagnosis not present

## 2021-10-28 DIAGNOSIS — E1122 Type 2 diabetes mellitus with diabetic chronic kidney disease: Secondary | ICD-10-CM | POA: Diagnosis not present

## 2021-10-28 DIAGNOSIS — Z993 Dependence on wheelchair: Secondary | ICD-10-CM | POA: Diagnosis not present

## 2021-10-28 DIAGNOSIS — E785 Hyperlipidemia, unspecified: Secondary | ICD-10-CM | POA: Diagnosis not present

## 2021-10-30 DIAGNOSIS — G3184 Mild cognitive impairment, so stated: Secondary | ICD-10-CM | POA: Diagnosis not present

## 2021-10-30 DIAGNOSIS — E1140 Type 2 diabetes mellitus with diabetic neuropathy, unspecified: Secondary | ICD-10-CM | POA: Diagnosis not present

## 2021-10-30 DIAGNOSIS — Z992 Dependence on renal dialysis: Secondary | ICD-10-CM | POA: Diagnosis not present

## 2021-10-30 DIAGNOSIS — I051 Rheumatic mitral insufficiency: Secondary | ICD-10-CM | POA: Diagnosis not present

## 2021-10-30 DIAGNOSIS — E785 Hyperlipidemia, unspecified: Secondary | ICD-10-CM | POA: Diagnosis not present

## 2021-10-30 DIAGNOSIS — E875 Hyperkalemia: Secondary | ICD-10-CM | POA: Diagnosis not present

## 2021-10-30 DIAGNOSIS — Z7982 Long term (current) use of aspirin: Secondary | ICD-10-CM | POA: Diagnosis not present

## 2021-10-30 DIAGNOSIS — I70222 Atherosclerosis of native arteries of extremities with rest pain, left leg: Secondary | ICD-10-CM | POA: Diagnosis not present

## 2021-10-30 DIAGNOSIS — D62 Acute posthemorrhagic anemia: Secondary | ICD-10-CM | POA: Diagnosis not present

## 2021-10-30 DIAGNOSIS — N3001 Acute cystitis with hematuria: Secondary | ICD-10-CM | POA: Diagnosis not present

## 2021-10-30 DIAGNOSIS — F32A Depression, unspecified: Secondary | ICD-10-CM | POA: Diagnosis not present

## 2021-10-30 DIAGNOSIS — E1122 Type 2 diabetes mellitus with diabetic chronic kidney disease: Secondary | ICD-10-CM | POA: Diagnosis not present

## 2021-10-30 DIAGNOSIS — I509 Heart failure, unspecified: Secondary | ICD-10-CM | POA: Diagnosis not present

## 2021-10-30 DIAGNOSIS — E1165 Type 2 diabetes mellitus with hyperglycemia: Secondary | ICD-10-CM | POA: Diagnosis not present

## 2021-10-30 DIAGNOSIS — S71111D Laceration without foreign body, right thigh, subsequent encounter: Secondary | ICD-10-CM | POA: Diagnosis not present

## 2021-10-30 DIAGNOSIS — Z993 Dependence on wheelchair: Secondary | ICD-10-CM | POA: Diagnosis not present

## 2021-10-30 DIAGNOSIS — N186 End stage renal disease: Secondary | ICD-10-CM | POA: Diagnosis not present

## 2021-10-30 DIAGNOSIS — F17210 Nicotine dependence, cigarettes, uncomplicated: Secondary | ICD-10-CM | POA: Diagnosis not present

## 2021-10-30 DIAGNOSIS — I700 Atherosclerosis of aorta: Secondary | ICD-10-CM | POA: Diagnosis not present

## 2021-10-30 DIAGNOSIS — K219 Gastro-esophageal reflux disease without esophagitis: Secondary | ICD-10-CM | POA: Diagnosis not present

## 2021-10-30 DIAGNOSIS — Z794 Long term (current) use of insulin: Secondary | ICD-10-CM | POA: Diagnosis not present

## 2021-10-30 DIAGNOSIS — I252 Old myocardial infarction: Secondary | ICD-10-CM | POA: Diagnosis not present

## 2021-10-30 DIAGNOSIS — R1311 Dysphagia, oral phase: Secondary | ICD-10-CM | POA: Diagnosis not present

## 2021-10-30 DIAGNOSIS — E1151 Type 2 diabetes mellitus with diabetic peripheral angiopathy without gangrene: Secondary | ICD-10-CM | POA: Diagnosis not present

## 2021-10-30 DIAGNOSIS — I110 Hypertensive heart disease with heart failure: Secondary | ICD-10-CM | POA: Diagnosis not present

## 2021-11-02 ENCOUNTER — Other Ambulatory Visit: Payer: Self-pay | Admitting: Vascular Surgery

## 2021-11-02 DIAGNOSIS — I70223 Atherosclerosis of native arteries of extremities with rest pain, bilateral legs: Secondary | ICD-10-CM

## 2021-11-03 DIAGNOSIS — E1122 Type 2 diabetes mellitus with diabetic chronic kidney disease: Secondary | ICD-10-CM | POA: Diagnosis not present

## 2021-11-03 DIAGNOSIS — Z992 Dependence on renal dialysis: Secondary | ICD-10-CM | POA: Diagnosis not present

## 2021-11-03 DIAGNOSIS — N186 End stage renal disease: Secondary | ICD-10-CM | POA: Diagnosis not present

## 2021-11-10 DIAGNOSIS — N186 End stage renal disease: Secondary | ICD-10-CM | POA: Diagnosis not present

## 2021-11-10 DIAGNOSIS — Z992 Dependence on renal dialysis: Secondary | ICD-10-CM | POA: Diagnosis not present

## 2021-11-10 DIAGNOSIS — N2581 Secondary hyperparathyroidism of renal origin: Secondary | ICD-10-CM | POA: Diagnosis not present

## 2021-11-11 ENCOUNTER — Telehealth: Payer: Self-pay | Admitting: *Deleted

## 2021-11-11 NOTE — Progress Notes (Signed)
  Care Coordination Note  11/11/2021 Name: [name] MRN: [id number] DOB: 06/21/1960  [name] is a 61 y.o. year old male who is a primary care patient of Katsadouros, Candace Gallus, MD and is actively engaged with the care management team. I reached out to [name] by phone today to assist with re-scheduling a follow up visit with the RN Case Manager  Follow up plan: Unsuccessful telephone outreach attempt made. A HIPAA compliant phone message was left for the patient providing contact information and requesting a return call.   [place]  Direct Dial: [phone]

## 2021-11-12 DIAGNOSIS — Z992 Dependence on renal dialysis: Secondary | ICD-10-CM | POA: Diagnosis not present

## 2021-11-12 DIAGNOSIS — N2581 Secondary hyperparathyroidism of renal origin: Secondary | ICD-10-CM | POA: Diagnosis not present

## 2021-11-12 DIAGNOSIS — N186 End stage renal disease: Secondary | ICD-10-CM | POA: Diagnosis not present

## 2021-11-16 ENCOUNTER — Encounter (HOSPITAL_BASED_OUTPATIENT_CLINIC_OR_DEPARTMENT_OTHER): Payer: Self-pay | Admitting: Emergency Medicine

## 2021-11-16 ENCOUNTER — Other Ambulatory Visit: Payer: Self-pay

## 2021-11-16 ENCOUNTER — Emergency Department (HOSPITAL_BASED_OUTPATIENT_CLINIC_OR_DEPARTMENT_OTHER)
Admission: EM | Admit: 2021-11-16 | Discharge: 2021-11-16 | Disposition: A | Discharge status: Home / Self Care | Payer: Medicare HMO | Attending: Emergency Medicine | Admitting: Emergency Medicine

## 2021-11-16 ENCOUNTER — Emergency Department (HOSPITAL_BASED_OUTPATIENT_CLINIC_OR_DEPARTMENT_OTHER): Payer: Medicare HMO

## 2021-11-16 DIAGNOSIS — Z794 Long term (current) use of insulin: Secondary | ICD-10-CM | POA: Diagnosis not present

## 2021-11-16 DIAGNOSIS — Z7982 Long term (current) use of aspirin: Secondary | ICD-10-CM | POA: Diagnosis not present

## 2021-11-16 DIAGNOSIS — Z992 Dependence on renal dialysis: Secondary | ICD-10-CM | POA: Insufficient documentation

## 2021-11-16 DIAGNOSIS — L03213 Periorbital cellulitis: Secondary | ICD-10-CM | POA: Insufficient documentation

## 2021-11-16 DIAGNOSIS — E1122 Type 2 diabetes mellitus with diabetic chronic kidney disease: Secondary | ICD-10-CM | POA: Insufficient documentation

## 2021-11-16 DIAGNOSIS — N186 End stage renal disease: Secondary | ICD-10-CM | POA: Diagnosis not present

## 2021-11-16 MED ORDER — CLINDAMYCIN HCL 150 MG PO CAPS
300.0000 mg | ORAL_CAPSULE | Freq: Once | ORAL | Status: AC
  Administered 2021-11-16: 300 mg via ORAL
  Filled 2021-11-16: qty 2

## 2021-11-16 MED ORDER — CEFDINIR 300 MG PO CAPS
300.0000 mg | ORAL_CAPSULE | Freq: Once | ORAL | Status: AC
  Administered 2021-11-16: 300 mg via ORAL
  Filled 2021-11-16: qty 1

## 2021-11-16 MED ORDER — CEFDINIR 300 MG PO CAPS
300.0000 mg | ORAL_CAPSULE | ORAL | 0 refills | Status: AC
  Filled 2021-11-16: qty 3, 6d supply, fill #0

## 2021-11-16 MED ORDER — CLINDAMYCIN HCL 300 MG PO CAPS
300.0000 mg | ORAL_CAPSULE | Freq: Three times a day (TID) | ORAL | 0 refills | Status: AC
  Filled 2021-11-16: qty 15, 5d supply, fill #0

## 2021-11-16 NOTE — Discharge Instructions (Signed)
You are being prescribed 2 different antibiotics to treat the skin infection near your eye.  Start the clindamycin tomorrow.  The cefdinir needs to be taken every other day, starting on 11/15.  If you develop fever, vision changes, new or worsening pain, headache, or any other new/concerning symptoms then return to the ER for evaluation.

## 2021-11-16 NOTE — ED Notes (Signed)
Patient transported to CT 

## 2021-11-16 NOTE — ED Provider Notes (Signed)
[facility] EMERGENC[facility] DEPT Provider Note   CSN: [id number] Arrival date & time: 11/16/21  1853     History  Chief Complaint  Patient presents with   Eye Problem   Hand Pain    [name] is a 61 y.o. male.  HPI 61 year old male with a history of diabetes, end-stage renal disease on dialysis, vascular disease, and multiple other comorbidities presents with left periorbital pain.  He has noticed it for about 3 days.  Feels like it is getting worse.  Hurts when he moves his eyes and he feels like he has a little bit of blurry vision in the left eye compared to the right.  However he also chronically has blurry vision.  He denies any actual ocular pain.  He had some clear tearing but no discharge.  He feels like it started with a small bump on his left eyelid.  He also has chronic hand pain though he and his sister note that is been ongoing for quite some time and attributed to vascular issues and not really worse today. It's been ongoing for months.  Home Medications Prior to Admission medications   Medication Sig Start Date End Date Taking? Authorizing Provider  cefdinir (OMNICEF) 300 MG capsule Take 1 capsule (300 mg total) by mouth every other day for 3 doses. 11/18/21 11/23/21 Yes Sherwood Gambler, MD  clindamycin (CLEOCIN) 300 MG capsule Take 1 capsule (300 mg total) by mouth 3 (three) times daily for 5 days. 11/16/21 11/21/21 Yes Sherwood Gambler, MD  Accu-Chek FastClix Lancets MISC Check blood sugar 3 (three) times daily. 06/04/21   Medina-Vargas, Monina C, NP  acetaminophen (TYLENOL) 325 MG tablet Take 2 tablets (650 mg total) by mouth every 4 (four) hours as needed for mild pain or moderate pain. 08/20/21   Virl Axe, MD  acetaminophen (TYLENOL) 650 MG CR tablet Take 2 tablets (1,300 mg total) by mouth every 6 (six) hours as needed for pain 09/10/21     amLODipine (NORVASC) 10 MG tablet Take 1 tablet (10 mg total) by mouth daily. Patient not taking: Reported on  08/10/2021 07/20/21   [name], MD  amLODipine (NORVASC) 10 MG tablet Take 1 tablet (10 mg total) by mouth daily for hypertension 09/10/21     aspirin EC 81 MG tablet Take 1 tablet (81 mg total) by mouth daily. Swallow whole. Patient not taking: Reported on 07/02/2021 06/03/21   Medina-Vargas, Monina C, NP  aspirin EC 81 MG tablet Take 1 tablet (81 mg total) by mouth daily. 09/10/21     blood glucose meter kit and supplies KIT Dispense based on patient and insurance preference. Use up to four times daily as directed. (FOR ICD-9 250.00, 250.01). 04/16/15   Norman Herrlich, MD  Blood Glucose Monitoring Suppl (ACCU-CHEK AVIVA PLUS) w/Device KIT Check blood sugar 3 times a day 12/19/15   Sid Falcon, MD  Blood Glucose Monitoring Suppl (BLOOD GLUCOSE MONITOR SYSTEM) w/Device KIT Use to check blood sugar 3 (three) times daily. 06/08/21   Medina-Vargas, Monina C, NP  carvedilol (COREG) 6.25 MG tablet Take 1 tablet (6.25 mg total) by mouth 2 (two) times daily with a meal. Patient not taking: Reported on 08/10/2021 07/20/21   [name], MD  carvedilol (COREG) 6.25 MG tablet Take 1 tablet (6.25 mg total) by mouth 2 (two) times daily with meals for hypertension 09/10/21     cetirizine (ZYRTEC ALLERGY) 10 MG tablet Take 1 tablet (10 mg total) by mouth daily. Patient not taking: Reported  on 07/02/2021 10/20/20   Harvie Heck, MD  cetirizine (ZYRTEC) 10 MG tablet Take 1 tablet (10 mg total) by mouth daily for allergic rhinitis 09/10/21     Continuous Blood Gluc Sensor (FREESTYLE LIBRE 2 SENSOR) MISC Place 1 sensor on the skin every 14 days. Use to check glucose continuously 02/05/21   [name], MD  Continuous Blood Gluc Sensor (FREESTYLE LIBRE 2 SENSOR) MISC Use as directed every 14 days 09/10/21   Schorr, Rhetta Mura, FNP  Darbepoetin Alfa (ARANESP) 100 MCG/0.5ML SOSY injection Inject 0.5 mLs (100 mcg total) into the vein every Wednesday with hemodialysis. 05/20/21   Orvis Brill, MD   DULoxetine (CYMBALTA) 30 MG capsule Take 2 capsules (60 mg total) by mouth daily. Patient not taking: Reported on 08/10/2021 07/20/21   [name], MD  DULoxetine (CYMBALTA) 60 MG capsule Take 1 capsule (60 mg total) by mouth daily for depression (Do NOT CRUSH) 09/10/21     ezetimibe (ZETIA) 10 MG tablet Take 1 tablet (10 mg total) by mouth daily. Patient not taking: Reported on 08/10/2021 06/03/21   Medina-Vargas, Monina C, NP  ezetimibe (ZETIA) 10 MG tablet Take 1 tablet (10 mg total) by mouth daily. 09/10/21     fluticasone (FLONASE) 50 MCG/ACT nasal spray Place 1 spray into both nostrils daily. Patient not taking: Reported on 08/10/2021 06/03/21   Medina-Vargas, Monina C, NP  fluticasone (FLONASE) 50 MCG/ACT nasal spray Place 1 spray into both nostrils daily for allergic rhinitis 09/10/21     glucose blood (ACCU-CHEK AVIVA PLUS) test strip Check blood sugar 3 times a day 06/04/21   Medina-Vargas, Monina C, NP  insulin aspart (NOVOLOG) 100 UNIT/ML FlexPen Inject 11 Units into the skin 3 (three) times daily with meals. Patient not taking: Reported on 08/10/2021 07/20/21   [name], MD  insulin aspart (NOVOLOG) 100 UNIT/ML FlexPen Inject 5 Units into the skin 3 (three) times daily with meals. 08/20/21   Virl Axe, MD  insulin glargine (LANTUS) 100 UNIT/ML Solostar Pen Inject 16 Units into the skin at bedtime. (PRIME PEN WITH 2 UNITS PRIOR TO EACH USE) 09/10/21     insulin glargine-yfgn (SEMGLEE) 100 UNIT/ML Pen Inject 10 Units into the skin at bedtime. 08/20/21   Virl Axe, MD  Insulin Pen Needle 32G X 4 MM MISC Use to inject insulin 4 (four) times daily. 06/04/21   Medina-Vargas, Monina C, NP  isosorbide mononitrate (IMDUR) 30 MG 24 hr tablet Take 0.5 tablets (15 mg total) by mouth daily for ischemic cardiomyopathy 09/10/21     melatonin (KP MELATONIN) 3 MG TABS tablet Take 1 tablet (3 mg total) by mouth at bedtime for insomnia. 09/10/21     oxyCODONE (OXY IR/ROXICODONE) 5 MG immediate  release tablet Take 1 tablet (5 mg total) by mouth every 6 (six) hours as needed for pain 09/10/21     pantoprazole (PROTONIX) 40 MG tablet Take 1 tablet by mouth once a day 08/05/21     pantoprazole (PROTONIX) 40 MG tablet Take 1 tablet (40 mg total) by mouth daily for GERD. 09/10/21     polyethylene glycol powder (GLYCOLAX/MIRALAX) 17 GM/SCOOP powder Take 17 g by mouth daily. 08/27/21     pregabalin (LYRICA) 25 MG capsule Take 1 capsule (25 mg total) by mouth daily. Patient not taking: Reported on 08/10/2021 07/20/21   [name], MD  pregabalin (LYRICA) 25 MG capsule Take 1 capsule (25 mg total) by mouth daily. 09/10/21     rosuvastatin (CRESTOR) 10 MG tablet Take 1  tablet (10 mg total) by mouth daily. Patient not taking: Reported on 08/10/2021 06/03/21   Medina-Vargas, Monina C, NP  rosuvastatin (CRESTOR) 10 MG tablet Take 1 tablet (10 mg total) by mouth daily for HLD 09/10/21     senna-docusate (SENEXON-S) 8.6-50 MG tablet Take 1 tablet by mouth at bedtime for constipation 09/10/21     senna-docusate (SENOKOT-S) 8.6-50 MG tablet Take 1 tablet by mouth at bedtime. Patient not taking: Reported on 08/10/2021 05/20/21   Orvis Brill, MD      Allergies    Benicar [olmesartan]    Review of Systems   Review of Systems  Constitutional:  Negative for fever.  Eyes:  Positive for visual disturbance. Negative for pain.  Neurological:  Negative for headaches.    Physical Exam Updated Vital Signs BP (!) 176/85   Pulse 90   Temp 98.2 F (36.8 C) (Oral)   Resp 18   Ht _0  (1.727 m)   Wt 64 kg   SpO2 100%   BMI 21.45 kg/m  Physical Exam Vitals and nursing note reviewed.  Constitutional:      Appearance: He is well-developed.  HENT:     Head: Normocephalic and atraumatic.   Eyes:     Extraocular Movements: Extraocular movements intact.     Pupils: Pupils are equal, round, and reactive to light.  Cardiovascular:     Rate and Rhythm: Normal rate and regular rhythm.     Pulses:           Radial pulses are 2+ on the right side.  Pulmonary:     Effort: Pulmonary effort is normal.  Abdominal:     General: There is no distension.  Musculoskeletal:     Comments: No hand swelling or acute ischemia  Skin:    General: Skin is warm and dry.  Neurological:     Mental Status: He is alert.     ED Results / Procedures / Treatments   Labs (all labs ordered are listed, but only abnormal results are displayed) Labs Reviewed - No data to display  EKG None  Radiology CT Orbits Wo Contrast  Result Date: 11/16/2021 CLINICAL DATA:  Left periorbital cellulitis. EXAM: CT ORBITS WITHOUT CONTRAST TECHNIQUE: Multidetector CT imaging of the orbits was performed using the standard protocol without intravenous contrast. Multiplanar CT image reconstructions were also generated. RADIATION DOSE REDUCTION: This exam was performed according to the departmental dose-optimization program which includes automated exposure control, adjustment of the mA and/or kV according to patient size and/or use of iterative reconstruction technique. COMPARISON:  Head CT 05/18/2020. FINDINGS: Orbits: No orbital mass or evidence of postseptal inflammation. Normal appearance of the globes, optic nerve-sheath complexes, extraocular muscles, orbital fat and lacrimal glands. Visible paranasal sinuses: There is trace membrane thickening in the maxillary sinuses. Other paranasal sinuses are clear. There is trace fluid in left mastoid tip chronically. Other bilateral mastoid air cells are clear. The middle ear cavities are clear. Soft tissues: There is mild edema in the left periorbital and preorbital soft tissues. No underlying abscess. There is no tonsillar enlargement or peritonsillar abscess. Osseous: No fracture or aggressive lesion. There are multiple severely decayed maxillary teeth. Bilaterally impacted mandibular wisdom teeth are partially visible. Limited intracranial: No acute or unexpected finding. IMPRESSION: 1. Left  periorbital and preorbital soft tissue edema consistent with cellulitis. 2. No evidence of postseptal edema or fluid collections. 3. Trace membrane thickening in the maxillary sinuses. 4. Trace fluid in the left mastoid tip chronically. 5.  Severely decayed maxillary teeth. Bilaterally impacted mandibular wisdom teeth. Follow-up with a dentist is recommended. Electronically Signed   By: Telford Nab M.D.   On: 11/16/2021 22:56    Procedures Procedures    Medications Ordered in ED Medications  clindamycin (CLEOCIN) capsule 300 mg (300 mg Oral Given 11/16/21 2310)  cefdinir (OMNICEF) capsule 300 mg (300 mg Oral Given 11/16/21 2310)    ED Course/ Medical Decision Making/ A&P                           Medical Decision Making Amount and/or Complexity of Data Reviewed Radiology: ordered and independent interpretation performed.    Details: CT without obvious abscess.  Risk Prescription drug management.   Visual acuity is stable across both eyes on testing.  He does have a little bit of mild pain with extraocular movements and given his significant comorbidities such as ESRD on dialysis, diabetes, vascular disease, he is at higher risk for more severe infection and so we decided to do a CT.  Given he was just put on dialysis, he would not be the best contrast candidate so a CT without was obtained.  No evidence of orbital cellulitis.  We will put on antibiotics, renally dosed.  Otherwise, he is well-appearing.  The hand symptoms seem to be a chronic and unchanged today.  No headache or altered mental status.  Given return precautions.        Final Clinical Impression(s) / ED Diagnoses Final diagnoses:  Periorbital cellulitis of left eye    Rx / DC Orders ED Discharge Orders          Ordered    cefdinir (OMNICEF) 300 MG capsule  Every 48 hours        11/16/21 2310    clindamycin (CLEOCIN) 300 MG capsule  3 times daily        11/16/21 2310              Sherwood Gambler,  MD 11/16/21 2313

## 2021-11-16 NOTE — ED Notes (Signed)
Pt verbalizes understanding of discharge instructions. Opportunity for questioning and answers were provided. Pt discharged from ED to home with family.    

## 2021-11-16 NOTE — ED Triage Notes (Signed)
Patient c/o bilateral hand pain x 1 week and left eye pain x 1 day.  Patient denies trauma.  Patient reports it feels like "someone is stabbing me in the eye."  Patient has swelling around left eye.  Patient endorses blurred vision in that eye.

## 2021-11-17 ENCOUNTER — Other Ambulatory Visit: Payer: Self-pay | Admitting: *Deleted

## 2021-11-17 ENCOUNTER — Encounter: Payer: Self-pay | Admitting: Internal Medicine

## 2021-11-17 ENCOUNTER — Ambulatory Visit (INDEPENDENT_AMBULATORY_CARE_PROVIDER_SITE_OTHER): Payer: Medicare HMO | Admitting: Internal Medicine

## 2021-11-17 ENCOUNTER — Other Ambulatory Visit (HOSPITAL_COMMUNITY): Payer: Self-pay

## 2021-11-17 ENCOUNTER — Other Ambulatory Visit: Payer: Self-pay

## 2021-11-17 VITALS — BP 163/87 | HR 86 | Temp 97.6°F

## 2021-11-17 DIAGNOSIS — I214 Non-ST elevation (NSTEMI) myocardial infarction: Secondary | ICD-10-CM

## 2021-11-17 DIAGNOSIS — E1152 Type 2 diabetes mellitus with diabetic peripheral angiopathy with gangrene: Secondary | ICD-10-CM

## 2021-11-17 DIAGNOSIS — Z794 Long term (current) use of insulin: Secondary | ICD-10-CM | POA: Diagnosis not present

## 2021-11-17 DIAGNOSIS — L03213 Periorbital cellulitis: Secondary | ICD-10-CM | POA: Diagnosis not present

## 2021-11-17 DIAGNOSIS — E1142 Type 2 diabetes mellitus with diabetic polyneuropathy: Secondary | ICD-10-CM

## 2021-11-17 LAB — GLUCOSE, CAPILLARY: Glucose-Capillary: 161 mg/dL — ABNORMAL HIGH (ref 70–99)

## 2021-11-17 LAB — POCT GLYCOSYLATED HEMOGLOBIN (HGB A1C): Hemoglobin A1C: 8.2 % — AB (ref 4.0–5.6)

## 2021-11-17 MED ORDER — PREGABALIN 25 MG PO CAPS
25.0000 mg | ORAL_CAPSULE | Freq: Every day | ORAL | 0 refills | Status: DC
  Filled 2021-11-17: qty 30, 30d supply, fill #0

## 2021-11-17 NOTE — Patient Instructions (Signed)
Dear Mr. Bocock,   Thank you for trusting [place] with your care today.   Please pick up the antibiotics. If your eye does not improve, after completing the antibiotics please call our office.   For the pain, please take the Lyrica daily. We would like for you to come back in 1 month to discuss pain control with your PCP.

## 2021-11-17 NOTE — Progress Notes (Signed)
CC: eye problem  HPI:[name] is a 61 y.o. male who presents for evaluation of eye problem. Please see individual problem based A/P for details.  Hx dm, esrd on HD,   Patient seen at [facility] yesterday for eye pain that has been present for past 4 days. He does report some clear tearing, but no discharge. He had CT orbit done which showed left preorbital  and periorbital soft tissue edema consistent with cellulitis, but no evidence of postseptal edema or fluid. He was given and Rx for clindamycine and cefdinir, but has not picked up either Rx yet. Not having any diplopia or fevers. Pain appears to be only surface level discomfort, no pain with eye movement or ocular pain.   Complains of shooting pain in left left. He was prescribed lyrica previously but says that he threw out medication because he did not notice much benefit from it. He has not taken his lyrica for some time.   Depression, PHQ-9: Based on the patients  [place] Visit from 11/17/2021 in [facility]  PHQ-9 Total Score 1      score we have .  Past Medical History:  Diagnosis Date   ESRD on hemodialysis ([place])    Gangrene ([place])    right foot   GERD (gastroesophageal reflux disease)    HFrEF (heart failure with reduced ejection fraction) ([place])    Hyperlipidemia    Hypertension    Neuromuscular disorder ([place])    diabetic neruopathy - hands   Osteomyelitis ([place]) 2010   left foot, s/p midfoot amputation   Osteomyelitis ([place]) 09/2013   RT BKA   Osteomyelitis of ankle or foot 05/2011   rt foot, s/p 5th ray amputation   PAD (peripheral artery disease) ([place])    Pneumonia 2010   Retroperitoneal hematoma    05/08/2021 renal biopsy complicated by retroperitoneal hematoma. 5/9 - 05/20/2021 H/H 6.9/20.9.  Imaging revealed large retroperitoneal hematoma.  S/P 2 units packed cells H/H stabilized with final values of 11.3/36.4.   S/P transmetatarsal amputation of foot, left ([place])  11/23/2019   SOB (shortness of breath)    uses inhaler prn   Type II diabetes mellitus ([place]) ~ 2002   Review of Systems:   See HPI  Physical Exam: Vitals:   11/17/21 1533  BP: (!) 163/87  Pulse: 86  Temp: 97.6 F (36.4 C)  TempSrc: Oral   General: NAD HEENT: Conjunctiva nl , antiicteric sclerae, moist mucous . Does have mild periorbital edema and erythema. No conjunctival injection. No ptsosis or chemosis. PERRL, no photosensitivity. membranes, no exudate or erythema Cardiovascular: Normal rate, regular rhythm.  No murmurs, rubs, or gallops Pulmonary : Equal breath sounds, No wheezes, rales, or rhonchi Abdominal: soft, nontender,  bowel sounds present Ext: No edema in lower extremities, no tenderness to palpation of lower extremities.   Assessment & Plan:   See Encounters Tab for problem based charting.  Neuropathic pain likely acutely worsened due to not taking his prescribed lyrica. He has a history of ESRD so is very limited on pain control options. Advised he start taking lyrica again as I feel this would at least help limit this acute exacerbation. He is scheduled for follow up visit with his PCP to further discuss pain control options. He may also benefit from referral to pain clinic for consideration of interventional options.  Exam remains consistent with preorbital cellulitis. No concern for extending infection as exam seems largely the same as that obtained at [facility].  Advised patient fill and take his antibiotics. Discussed signs and symptoms to monitor for and when to call clinic.   Patient discussed with Dr. Jimmye Norman

## 2021-11-17 NOTE — Telephone Encounter (Signed)
Oxycodone last refilled 09/10/21.

## 2021-11-17 NOTE — Telephone Encounter (Signed)
The aunt told [name] when she was scheduling [name] a f/u appt that [name]'s eye is black and he cannot see. Per chart [name] was seen at the ER at [facility] yesterday. [name] was given an appt today @ 1515PM with Dr Elliot Gurney.

## 2021-11-18 MED ORDER — DULOXETINE HCL 30 MG PO CPEP
60.0000 mg | ORAL_CAPSULE | Freq: Every day | ORAL | 1 refills | Status: DC
  Filled 2021-11-18: qty 180, 90d supply, fill #0

## 2021-11-18 MED ORDER — EZETIMIBE 10 MG PO TABS
10.0000 mg | ORAL_TABLET | Freq: Every day | ORAL | 0 refills | Status: DC
  Filled 2021-11-18: qty 30, 30d supply, fill #0

## 2021-11-18 MED ORDER — ROSUVASTATIN CALCIUM 10 MG PO TABS
10.0000 mg | ORAL_TABLET | Freq: Every day | ORAL | 2 refills | Status: DC
  Filled 2021-11-18: qty 90, 90d supply, fill #0
  Filled 2022-06-28: qty 90, 90d supply, fill #1

## 2021-11-18 MED ORDER — INSULIN GLARGINE 100 UNIT/ML SOLOSTAR PEN
10.0000 [IU] | PEN_INJECTOR | Freq: Every day | SUBCUTANEOUS | 0 refills | Status: DC
  Filled 2021-11-18: qty 3, 30d supply, fill #0

## 2021-11-18 MED ORDER — INSULIN ASPART 100 UNIT/ML FLEXPEN: 5.0000 [IU] | PEN_INJECTOR | Freq: Three times a day (TID) | SUBCUTANEOUS | 4 refills | Status: DC

## 2021-11-19 ENCOUNTER — Other Ambulatory Visit (HOSPITAL_COMMUNITY): Payer: Self-pay

## 2021-11-19 DIAGNOSIS — N2581 Secondary hyperparathyroidism of renal origin: Secondary | ICD-10-CM | POA: Diagnosis not present

## 2021-11-19 DIAGNOSIS — Z992 Dependence on renal dialysis: Secondary | ICD-10-CM | POA: Diagnosis not present

## 2021-11-19 DIAGNOSIS — N186 End stage renal disease: Secondary | ICD-10-CM | POA: Diagnosis not present

## 2021-11-20 ENCOUNTER — Encounter: Payer: Self-pay | Admitting: Internal Medicine

## 2021-11-20 DIAGNOSIS — L03213 Periorbital cellulitis: Secondary | ICD-10-CM | POA: Insufficient documentation

## 2021-11-20 NOTE — Assessment & Plan Note (Signed)
Patient seen at [facility] yesterday for eye pain that has been present for past 4 days. He does report some clear tearing, but no discharge. He had CT orbit done which showed left preorbital  and periorbital soft tissue edema consistent with cellulitis, but no evidence of postseptal edema or fluid. He was given and Rx for clindamycine and cefdinir, but has not picked up either Rx yet. Not having any diplopia or fevers. Pain appears to be only surface level discomfort, no pain with eye movement or ocular pain.   Does have mild periorbital edema and erythema. No conjunctival injection. No ptsosis or chemosis. PERRL, no photosensitivity.   Exam remains consistent with preorbital cellulitis. No concern for extending infection as exam seems largely the same as that obtained at [facility]. Advised patient fill and take his antibiotics. Discussed signs and symptoms to monitor for and when to call clinic.

## 2021-11-20 NOTE — Assessment & Plan Note (Signed)
Complains of shooting pain in left left. He was prescribed lyrica previously but says that he threw out medication because he did not notice much benefit from it. He has not taken his lyrica for some time.   Neuropathic pain likely acutely worsened due to not taking his prescribed lyrica. He has a history of ESRD so is very limited on pain control options. Advised he start taking lyrica again as I feel this would at least help limit this acute exacerbation. He is scheduled for follow up visit with his PCP to further discuss pain control options. He may also benefit from referral to pain clinic for consideration of interventional options.

## 2021-11-20 NOTE — Addendum Note (Signed)
Addended by: Velora Heckler on: 11/20/2021 08:52 AM   Modules accepted: Orders

## 2021-11-20 NOTE — Addendum Note (Signed)
Addended by: Velora Heckler on: 11/20/2021 08:57 AM   Modules accepted: Orders

## 2021-11-21 DIAGNOSIS — N2581 Secondary hyperparathyroidism of renal origin: Secondary | ICD-10-CM | POA: Diagnosis not present

## 2021-11-21 DIAGNOSIS — N186 End stage renal disease: Secondary | ICD-10-CM | POA: Diagnosis not present

## 2021-11-21 DIAGNOSIS — Z992 Dependence on renal dialysis: Secondary | ICD-10-CM | POA: Diagnosis not present

## 2021-11-21 MED ORDER — INSULIN ASPART 100 UNIT/ML FLEXPEN
5.0000 [IU] | PEN_INJECTOR | Freq: Three times a day (TID) | SUBCUTANEOUS | 4 refills | Status: DC
  Filled 2021-11-21: qty 12, 80d supply, fill #0

## 2021-11-23 ENCOUNTER — Other Ambulatory Visit (HOSPITAL_COMMUNITY): Payer: Self-pay

## 2021-11-25 DIAGNOSIS — N2581 Secondary hyperparathyroidism of renal origin: Secondary | ICD-10-CM | POA: Diagnosis not present

## 2021-11-25 DIAGNOSIS — Z992 Dependence on renal dialysis: Secondary | ICD-10-CM | POA: Diagnosis not present

## 2021-11-25 DIAGNOSIS — N186 End stage renal disease: Secondary | ICD-10-CM | POA: Diagnosis not present

## 2021-11-30 NOTE — Progress Notes (Signed)
  Care Coordination Note  11/30/2021 Name: [name] MRN: [id number] DOB: [birth year]  [name] is a 61 y.o. year old male who is a primary care patient of Katsadouros, Candace Gallus, MD and is actively engaged with the care management team. I reached out to [name] by phone today to assist with re-scheduling a follow up visit with the RN Case Manager  Follow up plan: Telephone appointment with care management team member scheduled for:12/09/21  [place]  Direct Dial: [phone]

## 2021-12-01 DIAGNOSIS — Z992 Dependence on renal dialysis: Secondary | ICD-10-CM | POA: Diagnosis not present

## 2021-12-01 DIAGNOSIS — N2581 Secondary hyperparathyroidism of renal origin: Secondary | ICD-10-CM | POA: Diagnosis not present

## 2021-12-01 DIAGNOSIS — N186 End stage renal disease: Secondary | ICD-10-CM | POA: Diagnosis not present

## 2021-12-01 NOTE — Progress Notes (Signed)
Internal Medicine Clinic Attending ° °Case discussed with Dr. Gawaluck  At the time of the visit.  We reviewed the resident’s history and exam and pertinent patient test results.  I agree with the assessment, diagnosis, and plan of care documented in the resident’s note.  °

## 2021-12-02 ENCOUNTER — Telehealth: Payer: Self-pay

## 2021-12-02 NOTE — Telephone Encounter (Signed)
     Patient  visit on 11/16/2021  at [facility] was for Periorbital cellulitis.  Have you been able to follow up with your primary care physician? Patient has appointment scheduled.  The patient was or was not able to obtain any needed medicine or equipment. Patient obtained medications.  Are there diet recommendations that you are having difficulty following? No  Patient expresses understanding of discharge instructions and education provided has no other needs at this time.    [place] Resource Care Guide   [email or web address]  ?? [phone]   Website: [email or web address]

## 2021-12-03 ENCOUNTER — Other Ambulatory Visit (HOSPITAL_COMMUNITY): Payer: Self-pay

## 2021-12-09 ENCOUNTER — Ambulatory Visit: Payer: Self-pay

## 2021-12-09 NOTE — Patient Outreach (Signed)
  Care Coordination   Follow Up Visit Note   12/09/2021 Name: Graison Leinberger MRN: [id number] DOB: 26-Jul-1960  Ean Gettel is a 61 y.o. year old male who sees [name], MD for primary care. I spoke with  [name] by phone today.  What matters to the patients health and wellness today?  The patient was unable to talk due to waiiting on his bus to go to an appointment.    Goals Addressed   None     SDOH assessments and interventions completed:  No     Care Coordination Interventions:  No, not indicated   Follow up plan: Follow up call scheduled for 12/8/ 11 am    Encounter Outcome:  Pt. Visit Completed   Lazaro Arms RN, BSN, [facility]   Phone: [phone]

## 2021-12-11 ENCOUNTER — Ambulatory Visit: Payer: Medicare Other | Admitting: Family

## 2021-12-11 ENCOUNTER — Telehealth: Payer: Self-pay

## 2021-12-11 NOTE — Patient Outreach (Signed)
  Care Coordination   12/11/2021 Name: [name] MRN: [id number] DOB: 05-08-1960   Care Coordination Outreach Attempts:  A third unsuccessful outreach was attempted today to offer the patient with information about available care coordination services as a benefit of their health plan.   Follow Up Plan:  No further outreach attempts will be made at this time. We have been unable to contact the patient to offer or enroll patient in care coordination services  Encounter Outcome:  Pt. Visit Completed   Care Coordination Interventions:  No, not indicated    Lazaro Arms RN, BSN, [facility]   Phone: [phone]

## 2021-12-14 DIAGNOSIS — N2581 Secondary hyperparathyroidism of renal origin: Secondary | ICD-10-CM | POA: Diagnosis not present

## 2021-12-14 DIAGNOSIS — N186 End stage renal disease: Secondary | ICD-10-CM | POA: Diagnosis not present

## 2021-12-14 DIAGNOSIS — R52 Pain, unspecified: Secondary | ICD-10-CM | POA: Diagnosis not present

## 2021-12-14 DIAGNOSIS — T8249XD Other complication of vascular dialysis catheter, subsequent encounter: Secondary | ICD-10-CM | POA: Diagnosis not present

## 2021-12-14 DIAGNOSIS — D631 Anemia in chronic kidney disease: Secondary | ICD-10-CM | POA: Diagnosis not present

## 2021-12-14 DIAGNOSIS — Z992 Dependence on renal dialysis: Secondary | ICD-10-CM | POA: Diagnosis not present

## 2021-12-15 DIAGNOSIS — Z992 Dependence on renal dialysis: Secondary | ICD-10-CM | POA: Diagnosis not present

## 2021-12-15 DIAGNOSIS — R52 Pain, unspecified: Secondary | ICD-10-CM | POA: Diagnosis not present

## 2021-12-15 DIAGNOSIS — T8249XD Other complication of vascular dialysis catheter, subsequent encounter: Secondary | ICD-10-CM | POA: Diagnosis not present

## 2021-12-15 DIAGNOSIS — D631 Anemia in chronic kidney disease: Secondary | ICD-10-CM | POA: Diagnosis not present

## 2021-12-15 DIAGNOSIS — N186 End stage renal disease: Secondary | ICD-10-CM | POA: Diagnosis not present

## 2021-12-15 DIAGNOSIS — N2581 Secondary hyperparathyroidism of renal origin: Secondary | ICD-10-CM | POA: Diagnosis not present

## 2021-12-16 ENCOUNTER — Ambulatory Visit: Payer: Medicare Other | Admitting: Family

## 2021-12-17 ENCOUNTER — Ambulatory Visit: Payer: Self-pay | Admitting: Licensed Clinical Social Worker

## 2021-12-17 DIAGNOSIS — T8249XD Other complication of vascular dialysis catheter, subsequent encounter: Secondary | ICD-10-CM | POA: Diagnosis not present

## 2021-12-17 DIAGNOSIS — R52 Pain, unspecified: Secondary | ICD-10-CM | POA: Diagnosis not present

## 2021-12-17 DIAGNOSIS — Z992 Dependence on renal dialysis: Secondary | ICD-10-CM | POA: Diagnosis not present

## 2021-12-17 DIAGNOSIS — D631 Anemia in chronic kidney disease: Secondary | ICD-10-CM | POA: Diagnosis not present

## 2021-12-17 DIAGNOSIS — N186 End stage renal disease: Secondary | ICD-10-CM | POA: Diagnosis not present

## 2021-12-17 DIAGNOSIS — N2581 Secondary hyperparathyroidism of renal origin: Secondary | ICD-10-CM | POA: Diagnosis not present

## 2021-12-17 NOTE — Patient Outreach (Signed)
SW removed self from care team.   Dhyana Bastone, BSW, MSW, LCSW-A  Social Worker [facility] Care Management  [phone] 

## 2021-12-17 NOTE — Patient Outreach (Signed)
SW removed self from care team.   Braelyn Bordonaro, BSW, MSW, LCSW-A  Social Worker [facility] Care Management  [phone] 

## 2021-12-19 DIAGNOSIS — N186 End stage renal disease: Secondary | ICD-10-CM | POA: Diagnosis not present

## 2021-12-19 DIAGNOSIS — T8249XD Other complication of vascular dialysis catheter, subsequent encounter: Secondary | ICD-10-CM | POA: Diagnosis not present

## 2021-12-19 DIAGNOSIS — N2581 Secondary hyperparathyroidism of renal origin: Secondary | ICD-10-CM | POA: Diagnosis not present

## 2021-12-19 DIAGNOSIS — R52 Pain, unspecified: Secondary | ICD-10-CM | POA: Diagnosis not present

## 2021-12-19 DIAGNOSIS — Z992 Dependence on renal dialysis: Secondary | ICD-10-CM | POA: Diagnosis not present

## 2021-12-19 DIAGNOSIS — D631 Anemia in chronic kidney disease: Secondary | ICD-10-CM | POA: Diagnosis not present

## 2021-12-24 DIAGNOSIS — N186 End stage renal disease: Secondary | ICD-10-CM | POA: Diagnosis not present

## 2021-12-24 DIAGNOSIS — N2581 Secondary hyperparathyroidism of renal origin: Secondary | ICD-10-CM | POA: Diagnosis not present

## 2021-12-24 DIAGNOSIS — Z992 Dependence on renal dialysis: Secondary | ICD-10-CM | POA: Diagnosis not present

## 2021-12-24 DIAGNOSIS — R52 Pain, unspecified: Secondary | ICD-10-CM | POA: Diagnosis not present

## 2021-12-24 DIAGNOSIS — D631 Anemia in chronic kidney disease: Secondary | ICD-10-CM | POA: Diagnosis not present

## 2021-12-24 DIAGNOSIS — T8249XD Other complication of vascular dialysis catheter, subsequent encounter: Secondary | ICD-10-CM | POA: Diagnosis not present

## 2021-12-31 DIAGNOSIS — Z992 Dependence on renal dialysis: Secondary | ICD-10-CM | POA: Diagnosis not present

## 2021-12-31 DIAGNOSIS — R52 Pain, unspecified: Secondary | ICD-10-CM | POA: Diagnosis not present

## 2021-12-31 DIAGNOSIS — N2581 Secondary hyperparathyroidism of renal origin: Secondary | ICD-10-CM | POA: Diagnosis not present

## 2021-12-31 DIAGNOSIS — T8249XD Other complication of vascular dialysis catheter, subsequent encounter: Secondary | ICD-10-CM | POA: Diagnosis not present

## 2021-12-31 DIAGNOSIS — N186 End stage renal disease: Secondary | ICD-10-CM | POA: Diagnosis not present

## 2021-12-31 DIAGNOSIS — D631 Anemia in chronic kidney disease: Secondary | ICD-10-CM | POA: Diagnosis not present

## 2022-01-02 DIAGNOSIS — N2581 Secondary hyperparathyroidism of renal origin: Secondary | ICD-10-CM | POA: Diagnosis not present

## 2022-01-02 DIAGNOSIS — T8249XD Other complication of vascular dialysis catheter, subsequent encounter: Secondary | ICD-10-CM | POA: Diagnosis not present

## 2022-01-02 DIAGNOSIS — D631 Anemia in chronic kidney disease: Secondary | ICD-10-CM | POA: Diagnosis not present

## 2022-01-02 DIAGNOSIS — Z992 Dependence on renal dialysis: Secondary | ICD-10-CM | POA: Diagnosis not present

## 2022-01-02 DIAGNOSIS — N186 End stage renal disease: Secondary | ICD-10-CM | POA: Diagnosis not present

## 2022-01-02 DIAGNOSIS — R52 Pain, unspecified: Secondary | ICD-10-CM | POA: Diagnosis not present

## 2022-01-03 DIAGNOSIS — E1122 Type 2 diabetes mellitus with diabetic chronic kidney disease: Secondary | ICD-10-CM | POA: Diagnosis not present

## 2022-01-03 DIAGNOSIS — Z992 Dependence on renal dialysis: Secondary | ICD-10-CM | POA: Diagnosis not present

## 2022-01-03 DIAGNOSIS — N186 End stage renal disease: Secondary | ICD-10-CM | POA: Diagnosis not present

## 2022-01-07 DIAGNOSIS — T8249XD Other complication of vascular dialysis catheter, subsequent encounter: Secondary | ICD-10-CM | POA: Diagnosis not present

## 2022-01-07 DIAGNOSIS — R52 Pain, unspecified: Secondary | ICD-10-CM | POA: Diagnosis not present

## 2022-01-07 DIAGNOSIS — N186 End stage renal disease: Secondary | ICD-10-CM | POA: Diagnosis not present

## 2022-01-07 DIAGNOSIS — N2581 Secondary hyperparathyroidism of renal origin: Secondary | ICD-10-CM | POA: Diagnosis not present

## 2022-01-07 DIAGNOSIS — Z992 Dependence on renal dialysis: Secondary | ICD-10-CM | POA: Diagnosis not present

## 2022-01-11 ENCOUNTER — Encounter: Payer: Medicare Other | Admitting: Student

## 2022-01-14 DIAGNOSIS — N186 End stage renal disease: Secondary | ICD-10-CM | POA: Diagnosis not present

## 2022-01-14 DIAGNOSIS — N2581 Secondary hyperparathyroidism of renal origin: Secondary | ICD-10-CM | POA: Diagnosis not present

## 2022-01-14 DIAGNOSIS — T8249XD Other complication of vascular dialysis catheter, subsequent encounter: Secondary | ICD-10-CM | POA: Diagnosis not present

## 2022-01-14 DIAGNOSIS — R52 Pain, unspecified: Secondary | ICD-10-CM | POA: Diagnosis not present

## 2022-01-14 DIAGNOSIS — Z992 Dependence on renal dialysis: Secondary | ICD-10-CM | POA: Diagnosis not present

## 2022-01-23 DIAGNOSIS — Z992 Dependence on renal dialysis: Secondary | ICD-10-CM | POA: Diagnosis not present

## 2022-01-23 DIAGNOSIS — T8249XD Other complication of vascular dialysis catheter, subsequent encounter: Secondary | ICD-10-CM | POA: Diagnosis not present

## 2022-01-23 DIAGNOSIS — N186 End stage renal disease: Secondary | ICD-10-CM | POA: Diagnosis not present

## 2022-01-23 DIAGNOSIS — R52 Pain, unspecified: Secondary | ICD-10-CM | POA: Diagnosis not present

## 2022-01-23 DIAGNOSIS — N2581 Secondary hyperparathyroidism of renal origin: Secondary | ICD-10-CM | POA: Diagnosis not present

## 2022-01-26 DIAGNOSIS — R52 Pain, unspecified: Secondary | ICD-10-CM | POA: Diagnosis not present

## 2022-01-26 DIAGNOSIS — Z992 Dependence on renal dialysis: Secondary | ICD-10-CM | POA: Diagnosis not present

## 2022-01-26 DIAGNOSIS — N2581 Secondary hyperparathyroidism of renal origin: Secondary | ICD-10-CM | POA: Diagnosis not present

## 2022-01-26 DIAGNOSIS — N186 End stage renal disease: Secondary | ICD-10-CM | POA: Diagnosis not present

## 2022-01-26 DIAGNOSIS — T8249XD Other complication of vascular dialysis catheter, subsequent encounter: Secondary | ICD-10-CM | POA: Diagnosis not present

## 2022-01-30 DIAGNOSIS — T8249XD Other complication of vascular dialysis catheter, subsequent encounter: Secondary | ICD-10-CM | POA: Diagnosis not present

## 2022-01-30 DIAGNOSIS — N186 End stage renal disease: Secondary | ICD-10-CM | POA: Diagnosis not present

## 2022-01-30 DIAGNOSIS — R52 Pain, unspecified: Secondary | ICD-10-CM | POA: Diagnosis not present

## 2022-01-30 DIAGNOSIS — N2581 Secondary hyperparathyroidism of renal origin: Secondary | ICD-10-CM | POA: Diagnosis not present

## 2022-01-30 DIAGNOSIS — Z992 Dependence on renal dialysis: Secondary | ICD-10-CM | POA: Diagnosis not present

## 2022-02-01 ENCOUNTER — Ambulatory Visit (INDEPENDENT_AMBULATORY_CARE_PROVIDER_SITE_OTHER): Payer: 59 | Admitting: Student

## 2022-02-01 ENCOUNTER — Other Ambulatory Visit (HOSPITAL_COMMUNITY): Payer: Self-pay

## 2022-02-01 VITALS — BP 156/76 | HR 78 | Ht 68.0 in | Wt 152.0 lb

## 2022-02-01 DIAGNOSIS — L03213 Periorbital cellulitis: Secondary | ICD-10-CM

## 2022-02-01 DIAGNOSIS — E1142 Type 2 diabetes mellitus with diabetic polyneuropathy: Secondary | ICD-10-CM | POA: Diagnosis not present

## 2022-02-01 DIAGNOSIS — E1152 Type 2 diabetes mellitus with diabetic peripheral angiopathy with gangrene: Secondary | ICD-10-CM

## 2022-02-01 DIAGNOSIS — Z794 Long term (current) use of insulin: Secondary | ICD-10-CM

## 2022-02-01 DIAGNOSIS — M25512 Pain in left shoulder: Secondary | ICD-10-CM

## 2022-02-01 MED ORDER — INSULIN GLARGINE 100 UNIT/ML SOLOSTAR PEN
15.0000 [IU] | PEN_INJECTOR | Freq: Every day | SUBCUTANEOUS | 1 refills | Status: DC
  Filled 2022-02-01: qty 15, 90d supply, fill #0

## 2022-02-01 MED ORDER — PREGABALIN 25 MG PO CAPS
25.0000 mg | ORAL_CAPSULE | Freq: Every day | ORAL | 0 refills | Status: DC
  Filled 2022-02-01: qty 30, 30d supply, fill #0

## 2022-02-01 NOTE — Progress Notes (Unsigned)
CC: Medication refill/left arm pain  HPI:  [name] is a 62 y.o. male with PMH as below who presents to clinic to follow-up on his chronic medical problems. Please see problem based charting for evaluation, assessment and plan.  Past Medical History:  Diagnosis Date   ESRD on hemodialysis ([place])    Gangrene ([place])    right foot   GERD (gastroesophageal reflux disease)    HFrEF (heart failure with reduced ejection fraction) ([place])    Hyperlipidemia    Hypertension    Neuromuscular disorder ([place])    diabetic neruopathy - hands   Osteomyelitis ([place]) 2010   left foot, s/p midfoot amputation   Osteomyelitis ([place]) 09/2013   RT BKA   Osteomyelitis of ankle or foot 05/2011   rt foot, s/p 5th ray amputation   PAD (peripheral artery disease) ([place])    Pneumonia 2010   Retroperitoneal hematoma    05/08/2021 renal biopsy complicated by retroperitoneal hematoma. 5/9 - 05/20/2021 H/H 6.9/20.9.  Imaging revealed large retroperitoneal hematoma.  S/P 2 units packed cells H/H stabilized with final values of 11.3/36.4.   S/P transmetatarsal amputation of foot, left ([place]) 11/23/2019   SOB (shortness of breath)    uses inhaler prn   Type II diabetes mellitus ([place]) ~ 2002    Review of Systems:  Constitutional: Negative for fever or fatigue Eyes: Negative for visual changes or eye redness Respiratory: Negative for shortness of breath MSK: Positive for left arm pain Abdomen: Negative for abdominal pain, nausea, or vomiting Neuro: Positive for numbness and tingling. Negative for headache or weakness  Physical Exam: General: Pleasant, well-appearing middle-age man. No acute distress. HEENT: No conjunctival injection, or periorbital edema. Anicteric sclera. Cardiac: RRR. No murmurs, rubs or gallops. No LE edema Respiratory: Lungs CTAB. No wheezing or crackles. Skin: Warm, dry and intact without rashes or lesions MSK: Normal ROM of left arm and shoulder. Normal Spurling's test.  Neurovascularly  intact distally. Neuro: A&O x 3. Moves all extremities. Normal sensation to gross touch. Psych: Appropriate mood and affect.  Vitals:   02/01/22 1341  BP: (!) 156/76  Pulse: 78  SpO2: 100%  Weight: 152 lb (68.9 kg)  Height: '5\' 8"'$  (1.727 m)    Assessment & Plan:   Type 2 diabetes mellitus with diabetic peripheral angiopathy and gangrene, with long-term current use of insulin (HCC) Last A1c 8.2% 2 months ago. She reports has been taking 11 units of his long-acting at night 7 units of his short acting 3 times with meals. He does not remember having any low blood sugars but states his aunt check his blood sugars and give him the insulin. He denies any headaches, dizziness or blurry vision. Discussed with patient about simplifying his regimen by giving himself 50% long-acting to 50% short acting. Patient agreeable to plan with recommendation to follow-up in 4 weeks for repeat A1c further adjustment to his regimen.  Plan: -Change Lantus to 15 units daily at bedtime -Change NovoLog to 5 units 3 times with meals -Follow-up in 4 weeks for repeat A1c  Diabetic neuropathy ([place]) Patient with a history of diabetic neuropathy previously managed with Lyrica.  Patient reports stopping Lyrica in the past as he did not notice much benefit from it. Since then, he has had worsening numbness and tingling mostly in his left arm down to his fingers as well as occasional shooting pains. Spurling test during this visit was negative however if Lyrica does not help with patient's pain, will consider imaging of the  cervical neck to rule out cervical pathology.   Plan: -Restart Lyrica 25 mg daily -Refer to to occupational therapy -Follow-up in 4 weeks for reevaluation  Periorbital cellulitis of left eye Patient diagnosed with periorbital cellulitis of the left eye in November 2023. He completed a course of clindamycin and cefdinir. His ocular symptoms have now resolved with normal conjunctiva without periorbital  edema. His vision is also back to his baseline.   See Encounters Tab for problem based charting.  Patient discussed with Dr. Lorenz Coaster, MD, MPH

## 2022-02-01 NOTE — Patient Instructions (Addendum)
Thank you, [name] for allowing [place] to provide your care today. Today we discussed your diabetes, blood pressure and neuropathy as well as your recent eye infection.    I have attached shoulder exercise for you to do at home.  I am also referring you to physical therapy.  I have ordered the following medication/changed the following medications:  Resume Lyrica 25 mg daily Increase your Lantus to 15 units at bedtime and decrease your NovoLog to 5 units 3 times a day with meals.  My Chart Access: [email or web address]  Please follow-up in 4 weeks for repeat A1c  Please make sure to arrive 15 minutes prior to your next appointment. If you arrive late, you may be asked to reschedule.    We look forward to seeing you next time. Please call our clinic at [phone] if you have any questions or concerns. The best time to call is Monday-Friday from 9am-4pm, but there is someone available 24/7. If after hours or the weekend, call the main hospital number and ask for the Internal Medicine Resident On-Call. If you need medication refills, please notify your pharmacy one week in advance and they will send [place] a request.   Thank you for letting us take part in your care. Wishing you the best!  Lacinda Axon, MD 02/01/2022, 2:57 PM IM Resident, PGY-3 [facility] 41:10

## 2022-02-03 ENCOUNTER — Encounter: Payer: Self-pay | Admitting: Student

## 2022-02-03 NOTE — Assessment & Plan Note (Signed)
Last A1c 8.2% 2 months ago. She reports has been taking 11 units of his long-acting at night 7 units of his short acting 3 times with meals. He does not remember having any low blood sugars but states his aunt check his blood sugars and give him the insulin. He denies any headaches, dizziness or blurry vision. Discussed with patient about simplifying his regimen by giving himself 50% long-acting to 50% short acting. Patient agreeable to plan with recommendation to follow-up in 4 weeks for repeat A1c further adjustment to his regimen.  Plan: -Change Lantus to 15 units daily at bedtime -Change NovoLog to 5 units 3 times with meals -Follow-up in 4 weeks for repeat A1c

## 2022-02-03 NOTE — Assessment & Plan Note (Signed)
Patient with a history of diabetic neuropathy previously managed with Lyrica.  Patient reports stopping Lyrica in the past as he did not notice much benefit from it. Since then, he has had worsening numbness and tingling mostly in his left arm down to his fingers as well as occasional shooting pains. Spurling test during this visit was negative however if Lyrica does not help with patient's pain, will consider imaging of the cervical neck to rule out cervical pathology.   Plan: -Restart Lyrica 25 mg daily -Refer to to occupational therapy -Follow-up in 4 weeks for reevaluation

## 2022-02-03 NOTE — Assessment & Plan Note (Signed)
Patient diagnosed with periorbital cellulitis of the left eye in November 2023. He completed a course of clindamycin and cefdinir. His ocular symptoms have now resolved with normal conjunctiva without periorbital edema. His vision is also back to his baseline.

## 2022-02-04 DIAGNOSIS — D631 Anemia in chronic kidney disease: Secondary | ICD-10-CM | POA: Diagnosis not present

## 2022-02-04 DIAGNOSIS — T8249XD Other complication of vascular dialysis catheter, subsequent encounter: Secondary | ICD-10-CM | POA: Diagnosis not present

## 2022-02-04 DIAGNOSIS — N2581 Secondary hyperparathyroidism of renal origin: Secondary | ICD-10-CM | POA: Diagnosis not present

## 2022-02-04 DIAGNOSIS — Z992 Dependence on renal dialysis: Secondary | ICD-10-CM | POA: Diagnosis not present

## 2022-02-04 DIAGNOSIS — N186 End stage renal disease: Secondary | ICD-10-CM | POA: Diagnosis not present

## 2022-02-06 DIAGNOSIS — D631 Anemia in chronic kidney disease: Secondary | ICD-10-CM | POA: Diagnosis not present

## 2022-02-06 DIAGNOSIS — N2581 Secondary hyperparathyroidism of renal origin: Secondary | ICD-10-CM | POA: Diagnosis not present

## 2022-02-06 DIAGNOSIS — N186 End stage renal disease: Secondary | ICD-10-CM | POA: Diagnosis not present

## 2022-02-06 DIAGNOSIS — Z992 Dependence on renal dialysis: Secondary | ICD-10-CM | POA: Diagnosis not present

## 2022-02-06 DIAGNOSIS — T8249XD Other complication of vascular dialysis catheter, subsequent encounter: Secondary | ICD-10-CM | POA: Diagnosis not present

## 2022-02-08 ENCOUNTER — Encounter: Payer: 59 | Admitting: Occupational Therapy

## 2022-02-08 NOTE — Progress Notes (Signed)
Internal Medicine Clinic Attending  Case discussed with the resident at the time of the visit.  We reviewed the resident's history and exam and pertinent patient test results.  I agree with the assessment, diagnosis, and plan of care documented in the resident's note.  

## 2022-02-09 DIAGNOSIS — D631 Anemia in chronic kidney disease: Secondary | ICD-10-CM | POA: Diagnosis not present

## 2022-02-09 DIAGNOSIS — N186 End stage renal disease: Secondary | ICD-10-CM | POA: Diagnosis not present

## 2022-02-09 DIAGNOSIS — T8249XD Other complication of vascular dialysis catheter, subsequent encounter: Secondary | ICD-10-CM | POA: Diagnosis not present

## 2022-02-09 DIAGNOSIS — Z992 Dependence on renal dialysis: Secondary | ICD-10-CM | POA: Diagnosis not present

## 2022-02-09 DIAGNOSIS — N2581 Secondary hyperparathyroidism of renal origin: Secondary | ICD-10-CM | POA: Diagnosis not present

## 2022-02-10 ENCOUNTER — Other Ambulatory Visit (HOSPITAL_COMMUNITY): Payer: Self-pay

## 2022-02-13 DIAGNOSIS — T8249XD Other complication of vascular dialysis catheter, subsequent encounter: Secondary | ICD-10-CM | POA: Diagnosis not present

## 2022-02-13 DIAGNOSIS — N186 End stage renal disease: Secondary | ICD-10-CM | POA: Diagnosis not present

## 2022-02-13 DIAGNOSIS — Z992 Dependence on renal dialysis: Secondary | ICD-10-CM | POA: Diagnosis not present

## 2022-02-13 DIAGNOSIS — N2581 Secondary hyperparathyroidism of renal origin: Secondary | ICD-10-CM | POA: Diagnosis not present

## 2022-02-13 DIAGNOSIS — D631 Anemia in chronic kidney disease: Secondary | ICD-10-CM | POA: Diagnosis not present

## 2022-02-20 DIAGNOSIS — N186 End stage renal disease: Secondary | ICD-10-CM | POA: Diagnosis not present

## 2022-02-20 DIAGNOSIS — D631 Anemia in chronic kidney disease: Secondary | ICD-10-CM | POA: Diagnosis not present

## 2022-02-20 DIAGNOSIS — N2581 Secondary hyperparathyroidism of renal origin: Secondary | ICD-10-CM | POA: Diagnosis not present

## 2022-02-20 DIAGNOSIS — Z992 Dependence on renal dialysis: Secondary | ICD-10-CM | POA: Diagnosis not present

## 2022-02-20 DIAGNOSIS — T8249XD Other complication of vascular dialysis catheter, subsequent encounter: Secondary | ICD-10-CM | POA: Diagnosis not present

## 2022-02-23 ENCOUNTER — Other Ambulatory Visit (HOSPITAL_COMMUNITY): Payer: Self-pay

## 2022-03-03 ENCOUNTER — Other Ambulatory Visit (HOSPITAL_COMMUNITY): Payer: Self-pay

## 2022-03-03 ENCOUNTER — Encounter: Payer: Self-pay | Admitting: Student

## 2022-03-03 ENCOUNTER — Ambulatory Visit (INDEPENDENT_AMBULATORY_CARE_PROVIDER_SITE_OTHER): Payer: 59 | Admitting: Student

## 2022-03-03 VITALS — BP 164/89 | HR 88 | Temp 98.1°F | Ht 68.0 in | Wt 157.6 lb

## 2022-03-03 DIAGNOSIS — I10 Essential (primary) hypertension: Secondary | ICD-10-CM

## 2022-03-03 DIAGNOSIS — E1122 Type 2 diabetes mellitus with diabetic chronic kidney disease: Secondary | ICD-10-CM

## 2022-03-03 DIAGNOSIS — E1152 Type 2 diabetes mellitus with diabetic peripheral angiopathy with gangrene: Secondary | ICD-10-CM

## 2022-03-03 DIAGNOSIS — F17210 Nicotine dependence, cigarettes, uncomplicated: Secondary | ICD-10-CM

## 2022-03-03 DIAGNOSIS — R918 Other nonspecific abnormal finding of lung field: Secondary | ICD-10-CM | POA: Diagnosis not present

## 2022-03-03 DIAGNOSIS — Z1211 Encounter for screening for malignant neoplasm of colon: Secondary | ICD-10-CM

## 2022-03-03 DIAGNOSIS — N186 End stage renal disease: Secondary | ICD-10-CM

## 2022-03-03 DIAGNOSIS — Z794 Long term (current) use of insulin: Secondary | ICD-10-CM | POA: Diagnosis not present

## 2022-03-03 DIAGNOSIS — E1151 Type 2 diabetes mellitus with diabetic peripheral angiopathy without gangrene: Secondary | ICD-10-CM

## 2022-03-03 DIAGNOSIS — Z89511 Acquired absence of right leg below knee: Secondary | ICD-10-CM

## 2022-03-03 DIAGNOSIS — I739 Peripheral vascular disease, unspecified: Secondary | ICD-10-CM

## 2022-03-03 DIAGNOSIS — F17200 Nicotine dependence, unspecified, uncomplicated: Secondary | ICD-10-CM

## 2022-03-03 DIAGNOSIS — E782 Mixed hyperlipidemia: Secondary | ICD-10-CM | POA: Diagnosis not present

## 2022-03-03 DIAGNOSIS — E119 Type 2 diabetes mellitus without complications: Secondary | ICD-10-CM

## 2022-03-03 DIAGNOSIS — Z992 Dependence on renal dialysis: Secondary | ICD-10-CM

## 2022-03-03 DIAGNOSIS — I120 Hypertensive chronic kidney disease with stage 5 chronic kidney disease or end stage renal disease: Secondary | ICD-10-CM | POA: Diagnosis not present

## 2022-03-03 LAB — POCT GLYCOSYLATED HEMOGLOBIN (HGB A1C): Hemoglobin A1C: 8.7 % — AB (ref 4.0–5.6)

## 2022-03-03 LAB — GLUCOSE, CAPILLARY: Glucose-Capillary: 203 mg/dL — ABNORMAL HIGH (ref 70–99)

## 2022-03-03 MED ORDER — INSULIN ASPART 100 UNIT/ML FLEXPEN
5.0000 [IU] | PEN_INJECTOR | Freq: Three times a day (TID) | SUBCUTANEOUS | 4 refills | Status: DC
  Filled 2022-03-03: qty 15, 100d supply, fill #0
  Filled 2022-06-28: qty 15, 100d supply, fill #1

## 2022-03-03 MED ORDER — PREGABALIN 25 MG PO CAPS
25.0000 mg | ORAL_CAPSULE | Freq: Every day | ORAL | 0 refills | Status: DC
  Filled 2022-03-03: qty 30, 30d supply, fill #0

## 2022-03-03 MED ORDER — INSULIN GLARGINE 100 UNIT/ML SOLOSTAR PEN
15.0000 [IU] | PEN_INJECTOR | Freq: Every day | SUBCUTANEOUS | 1 refills | Status: DC
  Filled 2022-03-03: qty 15, 100d supply, fill #0
  Filled 2022-06-28: qty 15, 100d supply, fill #1

## 2022-03-03 MED ORDER — VARENICLINE TARTRATE 0.5 MG PO TABS
0.5000 mg | ORAL_TABLET | Freq: Every day | ORAL | 1 refills | Status: DC
  Filled 2022-03-03: qty 90, 90d supply, fill #0
  Filled 2022-06-28: qty 90, 90d supply, fill #1

## 2022-03-03 NOTE — Assessment & Plan Note (Signed)
Assessment: Current regimen of amlodipine 10 mg and carvedilol 6.125 mg BID. He denies taking his blood pressure medications today. Nephrology team to continue managing his BP. Encouraged him to follow up with them.   Plan: - continue amlodipine 10 mg and carvedilol 6.125 mg BID - follow up further management per nephrology

## 2022-03-03 NOTE — Assessment & Plan Note (Addendum)
Assessment: Current regimen of lantus 15 U QHS and novolog 5 U TID with meals. He infrequently checks his glucose levels and has missed a few of his insulin doses. A1c today 8.7% from 8.2%. We discussed importance of adhering to insulin regimen and checking glucose levels regularly. He endorses difficulties remember to take his insulin. If continues to have increase in A1c and poor adherence to monitoring his insulin could decrease insulin and add weekly GLP-1i.   Plan: - continue lantus 15 U QHS and novolog 5 U TID - encourage adherence to regimen and checking glucose levels regularly - needs eye exam, ophthalmology referral placed

## 2022-03-03 NOTE — Progress Notes (Signed)
CC: Follow up diabetes  HPI:  [name] is a 62 y.o. male living with a history stated below and presents today for follow up of his diabetes. Please see problem based assessment and plan for additional details.  Past Medical History:  Diagnosis Date   ESRD on hemodialysis (HCC)    Gangrene (HCC)    right foot   GERD (gastroesophageal reflux disease)    HFrEF (heart failure with reduced ejection fraction) (HCC)    Hyperlipidemia    Hypertension    Neuromuscular disorder (HCC)    diabetic neruopathy - hands   Osteomyelitis (HCC) 2010   left foot, s/p midfoot amputation   Osteomyelitis (HCC) 09/2013   RT BKA   Osteomyelitis of ankle or foot 05/2011   rt foot, s/p 5th ray amputation   PAD (peripheral artery disease) (HCC)    Pneumonia 2010   Retroperitoneal hematoma    05/08/2021 renal biopsy complicated by retroperitoneal hematoma. 5/9 - 05/20/2021 H/H 6.9/20.9.  Imaging revealed large retroperitoneal hematoma.  S/P 2 units packed cells H/H stabilized with final values of 11.3/36.4.   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 (Patient not taking: Reported on 08/10/2021) 90 tablet 2   aspirin EC 81 MG tablet Take 1 tablet (81 mg total) by mouth daily. Swallow whole. (Patient not taking: Reported on 07/02/2021) 30 tablet 0   blood glucose meter kit and supplies KIT Dispense based on patient and insurance preference. Use up to four times daily as directed. (FOR ICD-9 250.00, 250.01). 1 each 0   Blood Glucose Monitoring Suppl (BLOOD GLUCOSE MONITOR SYSTEM) w/Device KIT Use to check blood sugar 3 (three) times daily. 1 kit 0   carvedilol (COREG) 6.25 MG tablet Take 1 tablet (6.25 mg total) by mouth 2 (two)  times daily with meals for hypertension 60 tablet 0   Continuous Blood Gluc Sensor (FREESTYLE LIBRE 2 SENSOR) MISC Place 1 sensor on the skin every 14 days. Use to check glucose continuously 2 each 3   Continuous Blood Gluc Sensor (FREESTYLE LIBRE 2 SENSOR) MISC Use as directed every 14 days 2 each 0   Darbepoetin Alfa (ARANESP) 100 MCG/0.5ML SOSY injection Inject 0.5 mLs (100 mcg total) into the vein every Wednesday with hemodialysis. 4.2 mL    DULoxetine (CYMBALTA) 30 MG capsule Take 2 capsules (60 mg total) by mouth daily. 180 capsule 1   ezetimibe (ZETIA) 10 MG tablet Take 1 tablet (10 mg total) by mouth daily. 30 tablet 0   fluticasone (FLONASE) 50 MCG/ACT nasal spray Place 1 spray into both nostrils daily. (Patient not taking: Reported on 08/10/2021) 16 g 0   glucose blood (ACCU-CHEK AVIVA PLUS) test strip Check blood sugar 3 times a day 100 each 12   Insulin Pen Needle 32G X 4 MM MISC Use to inject insulin 4 (four) times daily. 360 each 3   isosorbide mononitrate (IMDUR) 30 MG 24 hr tablet Take 0.5 tablets (15 mg total) by mouth daily for ischemic cardiomyopathy 15 tablet 0   melatonin (KP MELATONIN) 3 MG TABS tablet Take 1 tablet (3 mg total) by mouth at bedtime for insomnia. 30 tablet 0   oxyCODONE (OXY IR/ROXICODONE) 5 MG  immediate release tablet Take 1 tablet (5 mg total) by mouth every 6 (six) hours as needed for pain 120 tablet 0   pantoprazole (PROTONIX) 40 MG tablet Take 1 tablet by mouth once a day 30 tablet 11   polyethylene glycol powder (GLYCOLAX/MIRALAX) 17 GM/SCOOP powder Take 17 g by mouth daily. 510 g 0   rosuvastatin (CRESTOR) 10 MG tablet Take 1 tablet (10 mg total) by mouth daily. 90 tablet 2   senna-docusate (SENEXON-S) 8.6-50 MG tablet Take 1 tablet by mouth at bedtime for constipation 30 tablet 0   No current facility-administered medications on file prior to visit.    Family History  Problem Relation Age of Onset   Diabetes Mother    Hypertension Brother     Hypertension Sister    Anesthesia problems Neg Hx    Colon cancer Neg Hx    Rectal cancer Neg Hx    Stomach cancer Neg Hx     Social History   Socioeconomic History   Marital status: Single    Spouse name: Not on file   Number of children: 3   Years of education: 11th   Highest education level: Not on file  Occupational History    Employer: [employer]  Tobacco Use   Smoking status: Every Day    Years: 24.00    Types: Cigarettes    Passive exposure: Never   Smokeless tobacco: Never   Tobacco comments:    STARTED BACK SMOKING 2017. 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, makes chair parts)   Graduated from [employer]; No further school because he had a baby girl   He has 4 children  (17, 72 , 29, 30 as of 2013)   Social Determinants of Corporate investment banker Strain: Not on file  Food Insecurity: No Food Insecurity (09/21/2021)   Hunger Vital Sign    Worried About Running Out of Food in the Last Year: Never true    Ran Out of Food in the Last Year: Never true  Transportation Needs: No Transportation Needs (09/21/2021)   PRAPARE - Administrator, Civil Service (Medical): No    Lack of Transportation (Non-Medical): No  Physical Activity: Not on file  Stress: Not on file  Social Connections: Not on file  Intimate Partner Violence: Not on file    Review of Systems: ROS negative except for what is noted on the assessment and plan.  Vitals:   03/03/22 1458  BP: (!) 164/89  Pulse: 88  Temp: 98.1 F (36.7 C)  TempSrc: Oral  Weight: 157 lb 9.6 oz (71.5 kg)  Height: 5\' 8"  (1.727 m)     Physical Exam: Constitutional: no acute distress HENT: normocephalic atraumatic Eyes: conjunctiva non-erythematous Neck: supple Cardiovascular: regular rate and rhythm, no m/r/g Pulmonary/Chest: normal work of breathing on room air, lungs clear to auscultation bilaterally MSK: normal bulk and tone. Right AKA and left BKA no wounds or sores on residual limbs Neurological: alert & oriented x 3 Skin: warm and dry. Right IJ central line intact.  No erythema or soreness. Area is not covered with dressing Psych: flat affect  Assessment & Plan:   Hypertension, essential Assessment: Current regimen of amlodipine 10 mg and carvedilol 6.125 mg BID. He denies taking his blood pressure medications today. Nephrology team to continue managing his BP. Encouraged him to follow up with them.   Plan: - continue amlodipine 10 mg and carvedilol 6.125 mg BID - follow up further management per nephrology  Type 2 diabetes mellitus with diabetic peripheral angiopathy and gangrene, with long-term current use of insulin (HCC) Assessment: Current regimen of lantus 15 U QHS and novolog 5 U TID with meals. He infrequently checks his glucose levels and has missed a few of his insulin doses. A1c today 8.7% from 8.2%. We discussed importance of adhering to insulin regimen and checking glucose levels regularly. He endorses difficulties remember to take his insulin. If continues to have increase in A1c and poor adherence to monitoring his insulin could decrease insulin and add weekly GLP-1i.   Plan: - continue lantus 15 U QHS and novolog 5 U TID - encourage adherence to regimen and checking glucose levels regularly - needs eye exam, ophthalmology referral placed  PVD (peripheral vascular disease) Assessment: S/p left AKA and right BKA. He continues to have numbness/tingling in his fingers secondary to PAD. Best thing he can do is stop smoking which we discussed extensively today. He gets his left prosthetic  Friday which I believe will help with his ADL/iADL  Plan: - continue aspirin and rosuvastatin daily  ESRD on dialysis [facility]) Assessment: Hx of diabetic nephropathy. Misses some dialysis sessions due to difficulty with transportation. S/p failed fistula. His tunneled right IJ dialysis catheter was completely exposed today and not sutured in. He cannot remember the last time it was covered. Discussed bringing this up with renal navigator and asking if they are able to clean and cover the area at dialysis tomorrow.   Plan: - continue to work on dialysis adherence - follow up to make certain dialysis catheter is always covered and cleaned regularly. Hopeful he qualifies for medicaid and PCS  HLD (hyperlipidemia) Assessment: LDL at goal for secondary prevention  Plan: - continue rosuvastatin 10 mg daily  Tobacco use disorder Assessment: Extensive tobacco use disorder history. Discussed stopping today, he is interested in chantix as well as nicotine patches and gum. He has not had lung cancer CT screening. Last CT chest was in 2019 and showed pulmonary nodules. Does not appear these were ever followed up on.   Plan: - chantix (renally dosed .5 mg daily), nicotine patch and gum PRN - low con chest CT to follow up nodules  Pulmonary nodules Assessment: 2019 CT scan found to have mediastinal adenopathy. Reviewed by tumor board and PET scan performed. He was felt to be low risk for lung cancer. This does not see to have been followed since then. Will order low dose chest CT for lung cancer screening  Plan: - low dose CT chest  Patient discussed with Dr. Bonne Dolores, D.O. [facility] Internal Medicine, PGY-3 Phone: [phone] Date 03/04/2022 Time 10:29 AM

## 2022-03-03 NOTE — Patient Instructions (Addendum)
Thank you, [name] for allowing [place] to provide your care today. Today we discussed  Type 2 Diabetes Please continue to take your insulin Glargine 15U nightly and novolog 5U with meals.    Please check your sugars three times daily  Smoking Please try and stop smoking. We will start chantix, this helps with tobacco use  High blood pressure Please continue to take your carvedilol and amlodipine  Dialysis Please try and make your dialysis sessions. Let your dialysis coordinator know that you are having some difficulties getting to and from dialysis. Your central line should be cleaned and covered regularly.   Healthcare screening I have placed referrals for an eye exam and for colonoscopy  I have ordered the following labs for you:   Lab Orders         POC Hbg A1C       Referrals ordered today:   Referral Orders  No referral(s) requested today     I have ordered the following medication/changed the following medications:   Stop the following medications: Medications Discontinued During This Encounter  Medication Reason   acetaminophen (TYLENOL) 325 MG tablet    acetaminophen (TYLENOL) 650 MG CR tablet      Start the following medications: No orders of the defined types were placed in this encounter.    Follow up: 1 month follow up with me  Should you have any questions or concerns please call the internal medicine clinic at [phone].    Sanjuana Letters, D.O. [place]

## 2022-03-04 ENCOUNTER — Other Ambulatory Visit (HOSPITAL_COMMUNITY): Payer: Self-pay

## 2022-03-04 DIAGNOSIS — E1122 Type 2 diabetes mellitus with diabetic chronic kidney disease: Secondary | ICD-10-CM | POA: Diagnosis not present

## 2022-03-04 DIAGNOSIS — Z992 Dependence on renal dialysis: Secondary | ICD-10-CM | POA: Diagnosis not present

## 2022-03-04 DIAGNOSIS — F17200 Nicotine dependence, unspecified, uncomplicated: Secondary | ICD-10-CM | POA: Insufficient documentation

## 2022-03-04 DIAGNOSIS — N186 End stage renal disease: Secondary | ICD-10-CM | POA: Diagnosis not present

## 2022-03-04 MED ORDER — NICOTINE 21 MG/24HR TD PT24
21.0000 mg | MEDICATED_PATCH | TRANSDERMAL | 1 refills | Status: DC
  Filled 2022-03-04 – 2022-06-28 (×2): qty 28, 28d supply, fill #0

## 2022-03-04 MED ORDER — ACCU-CHEK FASTCLIX LANCETS MISC
Freq: Three times a day (TID) | 12 refills | Status: DC
  Filled 2022-03-04: qty 102, 33d supply, fill #0
  Filled 2022-06-28: qty 102, 33d supply, fill #1

## 2022-03-04 MED ORDER — NICOTINE POLACRILEX 2 MG MT GUM
2.0000 mg | CHEWING_GUM | OROMUCOSAL | 0 refills | Status: DC | PRN
  Filled 2022-03-04: qty 110, 15d supply, fill #0
  Filled 2022-06-28: qty 110, 110d supply, fill #0

## 2022-03-04 NOTE — Assessment & Plan Note (Signed)
Assessment: Hx of diabetic nephropathy. Misses some dialysis sessions due to difficulty with transportation. S/p failed fistula. His tunneled right IJ dialysis catheter was completely exposed today and not sutured in. He cannot remember the last time it was covered. Discussed bringing this up with renal navigator and asking if they are able to clean and cover the area at dialysis tomorrow.   Plan: - continue to work on dialysis adherence - follow up to make certain dialysis catheter is always covered and cleaned regularly. Hopeful he qualifies for medicaid and PCS

## 2022-03-04 NOTE — Assessment & Plan Note (Signed)
Assessment: S/p left AKA and right BKA. He continues to have numbness/tingling in his fingers secondary to PAD. Best thing he can do is stop smoking which we discussed extensively today. He gets his left prosthetic Friday which I believe will help with his ADL/iADL  Plan: - continue aspirin and rosuvastatin daily

## 2022-03-04 NOTE — Assessment & Plan Note (Signed)
Assessment: LDL at goal for secondary prevention  Plan: - continue rosuvastatin 10 mg daily

## 2022-03-04 NOTE — Progress Notes (Signed)
Internal Medicine Clinic Attending  Case discussed with Dr. Katsadouros  At the time of the visit.  We reviewed the resident's history and exam and pertinent patient test results.  I agree with the assessment, diagnosis, and plan of care documented in the resident's note.  

## 2022-03-04 NOTE — Assessment & Plan Note (Signed)
Assessment: 2019 CT scan found to have mediastinal adenopathy. Reviewed by tumor board and PET scan performed. He was felt to be low risk for lung cancer. This does not see to have been followed since then. Will order low dose chest CT for lung cancer screening  Plan: - low dose CT chest

## 2022-03-04 NOTE — Assessment & Plan Note (Addendum)
Assessment: Extensive tobacco use disorder history. Discussed stopping today, he is interested in chantix as well as nicotine patches and gum. He has not had lung cancer CT screening. Last CT chest was in 2019 and showed pulmonary nodules. Does not appear these were ever followed up on.   Plan: - chantix (renally dosed .5 mg daily), nicotine patch and gum PRN - low con chest CT to follow up nodules

## 2022-03-05 ENCOUNTER — Other Ambulatory Visit (HOSPITAL_COMMUNITY): Payer: Self-pay

## 2022-03-06 DIAGNOSIS — D631 Anemia in chronic kidney disease: Secondary | ICD-10-CM | POA: Diagnosis not present

## 2022-03-06 DIAGNOSIS — N186 End stage renal disease: Secondary | ICD-10-CM | POA: Diagnosis not present

## 2022-03-06 DIAGNOSIS — R52 Pain, unspecified: Secondary | ICD-10-CM | POA: Diagnosis not present

## 2022-03-06 DIAGNOSIS — T8249XD Other complication of vascular dialysis catheter, subsequent encounter: Secondary | ICD-10-CM | POA: Diagnosis not present

## 2022-03-06 DIAGNOSIS — Z992 Dependence on renal dialysis: Secondary | ICD-10-CM | POA: Diagnosis not present

## 2022-03-06 DIAGNOSIS — N2581 Secondary hyperparathyroidism of renal origin: Secondary | ICD-10-CM | POA: Diagnosis not present

## 2022-03-09 DIAGNOSIS — N186 End stage renal disease: Secondary | ICD-10-CM | POA: Diagnosis not present

## 2022-03-09 DIAGNOSIS — R52 Pain, unspecified: Secondary | ICD-10-CM | POA: Diagnosis not present

## 2022-03-09 DIAGNOSIS — N2581 Secondary hyperparathyroidism of renal origin: Secondary | ICD-10-CM | POA: Diagnosis not present

## 2022-03-09 DIAGNOSIS — D631 Anemia in chronic kidney disease: Secondary | ICD-10-CM | POA: Diagnosis not present

## 2022-03-09 DIAGNOSIS — Z992 Dependence on renal dialysis: Secondary | ICD-10-CM | POA: Diagnosis not present

## 2022-03-09 DIAGNOSIS — T8249XD Other complication of vascular dialysis catheter, subsequent encounter: Secondary | ICD-10-CM | POA: Diagnosis not present

## 2022-03-11 ENCOUNTER — Ambulatory Visit: Payer: 59 | Attending: Internal Medicine | Admitting: Occupational Therapy

## 2022-03-11 DIAGNOSIS — T8249XD Other complication of vascular dialysis catheter, subsequent encounter: Secondary | ICD-10-CM | POA: Diagnosis not present

## 2022-03-11 DIAGNOSIS — R52 Pain, unspecified: Secondary | ICD-10-CM | POA: Diagnosis not present

## 2022-03-11 DIAGNOSIS — N2581 Secondary hyperparathyroidism of renal origin: Secondary | ICD-10-CM | POA: Diagnosis not present

## 2022-03-11 DIAGNOSIS — Z992 Dependence on renal dialysis: Secondary | ICD-10-CM | POA: Diagnosis not present

## 2022-03-11 DIAGNOSIS — N186 End stage renal disease: Secondary | ICD-10-CM | POA: Diagnosis not present

## 2022-03-11 DIAGNOSIS — D631 Anemia in chronic kidney disease: Secondary | ICD-10-CM | POA: Diagnosis not present

## 2022-03-12 DIAGNOSIS — Z89612 Acquired absence of left leg above knee: Secondary | ICD-10-CM | POA: Diagnosis not present

## 2022-03-16 DIAGNOSIS — N2581 Secondary hyperparathyroidism of renal origin: Secondary | ICD-10-CM | POA: Diagnosis not present

## 2022-03-16 DIAGNOSIS — Z992 Dependence on renal dialysis: Secondary | ICD-10-CM | POA: Diagnosis not present

## 2022-03-16 DIAGNOSIS — N186 End stage renal disease: Secondary | ICD-10-CM | POA: Diagnosis not present

## 2022-03-16 DIAGNOSIS — R52 Pain, unspecified: Secondary | ICD-10-CM | POA: Diagnosis not present

## 2022-03-16 DIAGNOSIS — T8249XD Other complication of vascular dialysis catheter, subsequent encounter: Secondary | ICD-10-CM | POA: Diagnosis not present

## 2022-03-16 DIAGNOSIS — D631 Anemia in chronic kidney disease: Secondary | ICD-10-CM | POA: Diagnosis not present

## 2022-03-18 DIAGNOSIS — N2581 Secondary hyperparathyroidism of renal origin: Secondary | ICD-10-CM | POA: Diagnosis not present

## 2022-03-18 DIAGNOSIS — D631 Anemia in chronic kidney disease: Secondary | ICD-10-CM | POA: Diagnosis not present

## 2022-03-18 DIAGNOSIS — R52 Pain, unspecified: Secondary | ICD-10-CM | POA: Diagnosis not present

## 2022-03-18 DIAGNOSIS — T8249XD Other complication of vascular dialysis catheter, subsequent encounter: Secondary | ICD-10-CM | POA: Diagnosis not present

## 2022-03-18 DIAGNOSIS — N186 End stage renal disease: Secondary | ICD-10-CM | POA: Diagnosis not present

## 2022-03-18 DIAGNOSIS — Z992 Dependence on renal dialysis: Secondary | ICD-10-CM | POA: Diagnosis not present

## 2022-03-22 ENCOUNTER — Ambulatory Visit: Payer: 59

## 2022-03-24 ENCOUNTER — Encounter: Payer: 59 | Admitting: Student

## 2022-03-25 DIAGNOSIS — T8249XD Other complication of vascular dialysis catheter, subsequent encounter: Secondary | ICD-10-CM | POA: Diagnosis not present

## 2022-03-25 DIAGNOSIS — D631 Anemia in chronic kidney disease: Secondary | ICD-10-CM | POA: Diagnosis not present

## 2022-03-25 DIAGNOSIS — N186 End stage renal disease: Secondary | ICD-10-CM | POA: Diagnosis not present

## 2022-03-25 DIAGNOSIS — R52 Pain, unspecified: Secondary | ICD-10-CM | POA: Diagnosis not present

## 2022-03-25 DIAGNOSIS — Z992 Dependence on renal dialysis: Secondary | ICD-10-CM | POA: Diagnosis not present

## 2022-03-25 DIAGNOSIS — N2581 Secondary hyperparathyroidism of renal origin: Secondary | ICD-10-CM | POA: Diagnosis not present

## 2022-03-27 DIAGNOSIS — N186 End stage renal disease: Secondary | ICD-10-CM | POA: Diagnosis not present

## 2022-03-27 DIAGNOSIS — R52 Pain, unspecified: Secondary | ICD-10-CM | POA: Diagnosis not present

## 2022-03-27 DIAGNOSIS — N2581 Secondary hyperparathyroidism of renal origin: Secondary | ICD-10-CM | POA: Diagnosis not present

## 2022-03-27 DIAGNOSIS — Z992 Dependence on renal dialysis: Secondary | ICD-10-CM | POA: Diagnosis not present

## 2022-03-27 DIAGNOSIS — D631 Anemia in chronic kidney disease: Secondary | ICD-10-CM | POA: Diagnosis not present

## 2022-03-27 DIAGNOSIS — T8249XD Other complication of vascular dialysis catheter, subsequent encounter: Secondary | ICD-10-CM | POA: Diagnosis not present

## 2022-03-31 ENCOUNTER — Ambulatory Visit: Payer: 59 | Admitting: Physical Therapy

## 2022-03-31 DIAGNOSIS — R2689 Other abnormalities of gait and mobility: Secondary | ICD-10-CM

## 2022-03-31 DIAGNOSIS — M6281 Muscle weakness (generalized): Secondary | ICD-10-CM

## 2022-03-31 NOTE — Therapy (Signed)
[name] [address] [place], [place], [address] Phone: [phone]   Fax:  [phone]  Patient Details - ARRIVED NO CHARGE Name: [name] MRN: [id number] Date of Birth: [birth year] Referring Provider:  [name], *  Encounter Date: 03/31/2022  Pt arrives without left prosthetic (more acute amputation) stating that it does not fit and it makes him too tall on the left side.  He further reports he is independent with squat pivot transfers w/ R prosthetic only and sliding onto the bed from his wheelchair without any prosthetics.  He does not have a walker at home as he cannot stand currently w/ the prosthetics.  He did not bring socks, liner, or prosthetic for examination this visit.  He states he has notified [facility] of this fit issue, but has not heard back.  Encouraged bringing socks, liner, and left prosthetic to be evaluated for further more detailed communication with [facility] as needed.   Bary Richard, PT, DPT 03/31/2022, 4:04 PM  [facility] [address] [place], [place], [address] Phone: [phone]   Fax:  [phone]

## 2022-04-02 ENCOUNTER — Other Ambulatory Visit (HOSPITAL_COMMUNITY): Payer: Self-pay

## 2022-04-03 DIAGNOSIS — D631 Anemia in chronic kidney disease: Secondary | ICD-10-CM | POA: Diagnosis not present

## 2022-04-03 DIAGNOSIS — R52 Pain, unspecified: Secondary | ICD-10-CM | POA: Diagnosis not present

## 2022-04-03 DIAGNOSIS — Z992 Dependence on renal dialysis: Secondary | ICD-10-CM | POA: Diagnosis not present

## 2022-04-03 DIAGNOSIS — N186 End stage renal disease: Secondary | ICD-10-CM | POA: Diagnosis not present

## 2022-04-03 DIAGNOSIS — N2581 Secondary hyperparathyroidism of renal origin: Secondary | ICD-10-CM | POA: Diagnosis not present

## 2022-04-03 DIAGNOSIS — T8249XD Other complication of vascular dialysis catheter, subsequent encounter: Secondary | ICD-10-CM | POA: Diagnosis not present

## 2022-04-04 DIAGNOSIS — N186 End stage renal disease: Secondary | ICD-10-CM | POA: Diagnosis not present

## 2022-04-04 DIAGNOSIS — Z992 Dependence on renal dialysis: Secondary | ICD-10-CM | POA: Diagnosis not present

## 2022-04-04 DIAGNOSIS — E1122 Type 2 diabetes mellitus with diabetic chronic kidney disease: Secondary | ICD-10-CM | POA: Diagnosis not present

## 2022-04-05 ENCOUNTER — Ambulatory Visit: Payer: 59

## 2022-04-07 ENCOUNTER — Emergency Department (HOSPITAL_COMMUNITY)
Admission: EM | Admit: 2022-04-07 | Discharge: 2022-04-07 | Disposition: A | Discharge status: Home / Self Care | Payer: 59 | Attending: Emergency Medicine | Admitting: Emergency Medicine

## 2022-04-07 ENCOUNTER — Other Ambulatory Visit: Payer: Self-pay

## 2022-04-07 ENCOUNTER — Encounter (HOSPITAL_COMMUNITY): Payer: Self-pay

## 2022-04-07 DIAGNOSIS — N186 End stage renal disease: Secondary | ICD-10-CM | POA: Diagnosis not present

## 2022-04-07 DIAGNOSIS — M79641 Pain in right hand: Secondary | ICD-10-CM | POA: Diagnosis not present

## 2022-04-07 DIAGNOSIS — Z794 Long term (current) use of insulin: Secondary | ICD-10-CM | POA: Diagnosis not present

## 2022-04-07 DIAGNOSIS — E119 Type 2 diabetes mellitus without complications: Secondary | ICD-10-CM | POA: Diagnosis not present

## 2022-04-07 DIAGNOSIS — M79642 Pain in left hand: Secondary | ICD-10-CM | POA: Insufficient documentation

## 2022-04-07 DIAGNOSIS — Z7982 Long term (current) use of aspirin: Secondary | ICD-10-CM | POA: Diagnosis not present

## 2022-04-07 MED ORDER — PREGABALIN 25 MG PO CAPS
25.0000 mg | ORAL_CAPSULE | Freq: Once | ORAL | Status: AC
  Administered 2022-04-07: 25 mg via ORAL
  Filled 2022-04-07: qty 1

## 2022-04-07 MED ORDER — PREGABALIN 25 MG PO CAPS
25.0000 mg | ORAL_CAPSULE | Freq: Every day | ORAL | 0 refills | Status: DC
  Filled 2022-04-07: qty 14, 14d supply, fill #0

## 2022-04-07 NOTE — Discharge Instructions (Signed)
You came to the emergency department today with stiffness and pain in your hands.  This is a chronic problem and I need you to make an appointment with your PCP about this.  I did send the medications that we discussed your pharmacy.  It was a pleasure to meet you and we hope you feel better.  You may continue to take Tylenol with these medications as well.

## 2022-04-07 NOTE — ED Provider Notes (Signed)
[facility] Provider Note   CSN: [id number] Arrival date & time: 04/07/22  1659     History  Chief Complaint  Patient presents with   Bil hand stiffness    [name] is a 62 y.o. male with a past medical history of type 2 diabetes, ESRD and PAD presenting today with bilateral hand stiffness.  He says it has been going on for 2 weeks.  Reports being out of his medications.  Also is telling me that he is ready to go home when I first went to evaluate him.  HPI     Home Medications Prior to Admission medications   Medication Sig Start Date End Date Taking? Authorizing Provider  Accu-Chek FastClix Lancets MISC Check blood sugar 3 (three) times daily. 03/04/22   Katsadouros, Vasilios, MD  amLODipine (NORVASC) 10 MG tablet Take 1 tablet (10 mg total) by mouth daily. Patient not taking: Reported on 08/10/2021 07/20/21   [name], MD  aspirin EC 81 MG tablet Take 1 tablet (81 mg total) by mouth daily. Swallow whole. Patient not taking: Reported on 07/02/2021 06/03/21   Medina-Vargas, Monina C, NP  blood glucose meter kit and supplies KIT Dispense based on patient and insurance preference. Use up to four times daily as directed. (FOR ICD-9 250.00, 250.01). 04/16/15   Norman Herrlich, MD  Blood Glucose Monitoring Suppl (BLOOD GLUCOSE MONITOR SYSTEM) w/Device KIT Use to check blood sugar 3 (three) times daily. 06/08/21   Medina-Vargas, Monina C, NP  carvedilol (COREG) 6.25 MG tablet Take 1 tablet (6.25 mg total) by mouth 2 (two) times daily with meals for hypertension 09/10/21     Continuous Blood Gluc Sensor (FREESTYLE LIBRE 2 SENSOR) MISC Place 1 sensor on the skin every 14 days. Use to check glucose continuously 02/05/21   [name], MD  Continuous Blood Gluc Sensor (FREESTYLE LIBRE 2 SENSOR) MISC Use as directed every 14 days 09/10/21   Schorr, Rhetta Mura, FNP  Darbepoetin Alfa (ARANESP) 100 MCG/0.5ML SOSY injection Inject 0.5 mLs  (100 mcg total) into the vein every Wednesday with hemodialysis. 05/20/21   Orvis Brill, MD  DULoxetine (CYMBALTA) 30 MG capsule Take 2 capsules (60 mg total) by mouth daily. 11/18/21   Katsadouros, Vasilios, MD  ezetimibe (ZETIA) 10 MG tablet Take 1 tablet (10 mg total) by mouth daily. 11/18/21   Katsadouros, Vasilios, MD  fluticasone (FLONASE) 50 MCG/ACT nasal spray Place 1 spray into both nostrils daily. Patient not taking: Reported on 08/10/2021 06/03/21   Medina-Vargas, Monina C, NP  glucose blood (ACCU-CHEK AVIVA PLUS) test strip Check blood sugar 3 times a day 06/04/21   Medina-Vargas, Monina C, NP  insulin aspart (NOVOLOG) 100 UNIT/ML FlexPen Inject 5 Units into the skin 3 (three) times daily with meals. 03/03/22   Katsadouros, Vasilios, MD  insulin glargine (LANTUS) 100 UNIT/ML Solostar Pen Inject 15 Units into the skin at bedtime. 03/03/22   Katsadouros, Vasilios, MD  Insulin Pen Needle 32G X 4 MM MISC Use to inject insulin 4 (four) times daily. 06/04/21   Medina-Vargas, Monina C, NP  isosorbide mononitrate (IMDUR) 30 MG 24 hr tablet Take 0.5 tablets (15 mg total) by mouth daily for ischemic cardiomyopathy 09/10/21     melatonin (KP MELATONIN) 3 MG TABS tablet Take 1 tablet (3 mg total) by mouth at bedtime for insomnia. 09/10/21     nicotine (NICODERM CQ - DOSED IN MG/24 HOURS) 21 mg/24hr patch Place 1 patch (21 mg total) onto  the skin daily. 03/04/22   [name], MD  nicotine polacrilex (NICORETTE) 2 MG gum Take 1 each (2 mg total) by mouth as needed for smoking cessation. 03/04/22   [name], MD  oxyCODONE (OXY IR/ROXICODONE) 5 MG immediate release tablet Take 1 tablet (5 mg total) by mouth every 6 (six) hours as needed for pain 09/10/21     pantoprazole (PROTONIX) 40 MG tablet Take 1 tablet by mouth once a day 08/05/21     polyethylene glycol powder (GLYCOLAX/MIRALAX) 17 GM/SCOOP powder Take 17 g by mouth daily. 08/27/21     pregabalin (LYRICA) 25 MG capsule Take 1 capsule  (25 mg total) by mouth daily. 03/03/22   [name], MD  rosuvastatin (CRESTOR) 10 MG tablet Take 1 tablet (10 mg total) by mouth daily. 11/18/21   [name], MD  senna-docusate (SENEXON-S) 8.6-50 MG tablet Take 1 tablet by mouth at bedtime for constipation 09/10/21     varenicline (CHANTIX) 0.5 MG tablet Take 1 tablet (0.5 mg total) by mouth daily. 03/03/22   [name], MD      Allergies    Benicar [olmesartan]    Review of Systems   Review of Systems  Physical Exam Updated Vital Signs BP 131/83   Pulse 87   Temp 98.2 F (36.8 C) (Oral)   Resp 16   SpO2 100%  Physical Exam Vitals and nursing note reviewed.  Constitutional:      Appearance: Normal appearance.  HENT:     Head: Normocephalic and atraumatic.  Eyes:     General: No scleral icterus.    Conjunctiva/sclera: Conjunctivae normal.  Pulmonary:     Effort: Pulmonary effort is normal. No respiratory distress.  Musculoskeletal:     Comments: Full range of motion of bilateral wrist and all MCPs.  Normal cap refill in all digits.  Some ulceration to the dorsal surfaces of the right hand.  No signs of cellulitis.  No swelling  Skin:    Findings: No rash.  Neurological:     Mental Status: He is alert.  Psychiatric:        Mood and Affect: Mood normal.     ED Results / Procedures / Treatments   Labs (all labs ordered are listed, but only abnormal results are displayed) Labs Reviewed - No data to display  EKG None  Radiology No results found.  Procedures Procedures   Medications Ordered in ED Medications - No data to display  ED Course/ Medical Decision Making/ A&P                             Medical Decision Making Risk Prescription drug management.   Per chart review patient follows with internal medicine resident clinic.  He has multiple visits and lists of neuromuscular disorder " diabetic neuropathy-hands" on his problem list.  Patient is November note reports that  he is supposed to be on Lyrica but that he does not consistently take it.  He had an ED visit further his hand discomfort in November 2023 as well.  Physical exam: Full range of motion of bilateral upper extremities.  Normal cap refill normal radial pulse.  Patient is supposed to be taking Lyrica for this pain but he has not been.  This was last refilled a month ago.  I told him I can send him a week worth of this however he needs to follow-up with PCP.  He is agreeable.  Continues to request  discharge.  I suspect his pain is secondary to neuropathy.  Also has had arterial studies of his hands with vascular without acute findings.  Believe he does not have an emergent cause of his hand pain and can be discharged at this time.    Final Clinical Impression(s) / ED Diagnoses Final diagnoses:  Bilateral hand pain    Rx / DC Orders ED Discharge Orders          Ordered    pregabalin (LYRICA) 25 MG capsule  Daily        04/07/22 1922           Results and diagnoses were explained to the patient. Return precautions discussed in full. Patient had no additional questions and expressed complete understanding.   This chart was dictated using voice recognition software.  Despite best efforts to proofread,  errors can occur which can change the documentation meaning.    Darliss Ridgel 04/07/22 1924    Tegeler, Gwenyth Allegra, MD 04/07/22 [phone]

## 2022-04-07 NOTE — ED Triage Notes (Signed)
Pt came in via POV d/t stiffness & difficulty moving both hands that started a couple of weeks ago. Denies injuries or any falls, denies pain, mainly endorses tingling in both hands. A/Ox4.

## 2022-04-08 ENCOUNTER — Other Ambulatory Visit: Payer: Self-pay

## 2022-04-08 ENCOUNTER — Other Ambulatory Visit (HOSPITAL_COMMUNITY): Payer: Self-pay

## 2022-04-08 ENCOUNTER — Ambulatory Visit (HOSPITAL_COMMUNITY): Payer: 59

## 2022-04-10 DIAGNOSIS — D631 Anemia in chronic kidney disease: Secondary | ICD-10-CM | POA: Diagnosis not present

## 2022-04-10 DIAGNOSIS — T8249XD Other complication of vascular dialysis catheter, subsequent encounter: Secondary | ICD-10-CM | POA: Diagnosis not present

## 2022-04-10 DIAGNOSIS — R52 Pain, unspecified: Secondary | ICD-10-CM | POA: Diagnosis not present

## 2022-04-10 DIAGNOSIS — N2581 Secondary hyperparathyroidism of renal origin: Secondary | ICD-10-CM | POA: Diagnosis not present

## 2022-04-10 DIAGNOSIS — Z992 Dependence on renal dialysis: Secondary | ICD-10-CM | POA: Diagnosis not present

## 2022-04-10 DIAGNOSIS — N186 End stage renal disease: Secondary | ICD-10-CM | POA: Diagnosis not present

## 2022-04-12 ENCOUNTER — Telehealth: Payer: Self-pay

## 2022-04-12 NOTE — Telephone Encounter (Signed)
        Patient  visited  on 4/3     Telephone encounter attempt :  2ND  A HIPAA compliant voice message was left requesting a return call.  Instructed patient to call back .    Lenard Forth [facility] Guide, [facility] [phone] [address] [place], [place], [place] [address] Phone: [phone] Email: [email or web address]

## 2022-04-12 NOTE — Telephone Encounter (Signed)
        Patient  visited [facility] on 4/3     Telephone encounter attempt :  1st  A HIPAA compliant voice message was left requesting a return call.  Instructed patient to call back     Cheryal Salas [facility] Guide, [facility] [phone] [address], [place], [place] [address] Phone: [phone] Email: [email or web address]       

## 2022-04-13 DIAGNOSIS — N2581 Secondary hyperparathyroidism of renal origin: Secondary | ICD-10-CM | POA: Diagnosis not present

## 2022-04-13 DIAGNOSIS — T8249XD Other complication of vascular dialysis catheter, subsequent encounter: Secondary | ICD-10-CM | POA: Diagnosis not present

## 2022-04-13 DIAGNOSIS — R52 Pain, unspecified: Secondary | ICD-10-CM | POA: Diagnosis not present

## 2022-04-13 DIAGNOSIS — D631 Anemia in chronic kidney disease: Secondary | ICD-10-CM | POA: Diagnosis not present

## 2022-04-13 DIAGNOSIS — Z992 Dependence on renal dialysis: Secondary | ICD-10-CM | POA: Diagnosis not present

## 2022-04-13 DIAGNOSIS — N186 End stage renal disease: Secondary | ICD-10-CM | POA: Diagnosis not present

## 2022-04-17 DIAGNOSIS — N2581 Secondary hyperparathyroidism of renal origin: Secondary | ICD-10-CM | POA: Diagnosis not present

## 2022-04-17 DIAGNOSIS — D631 Anemia in chronic kidney disease: Secondary | ICD-10-CM | POA: Diagnosis not present

## 2022-04-17 DIAGNOSIS — Z992 Dependence on renal dialysis: Secondary | ICD-10-CM | POA: Diagnosis not present

## 2022-04-17 DIAGNOSIS — R52 Pain, unspecified: Secondary | ICD-10-CM | POA: Diagnosis not present

## 2022-04-17 DIAGNOSIS — T8249XD Other complication of vascular dialysis catheter, subsequent encounter: Secondary | ICD-10-CM | POA: Diagnosis not present

## 2022-04-17 DIAGNOSIS — N186 End stage renal disease: Secondary | ICD-10-CM | POA: Diagnosis not present

## 2022-04-20 DIAGNOSIS — T8249XD Other complication of vascular dialysis catheter, subsequent encounter: Secondary | ICD-10-CM | POA: Diagnosis not present

## 2022-04-20 DIAGNOSIS — N2581 Secondary hyperparathyroidism of renal origin: Secondary | ICD-10-CM | POA: Diagnosis not present

## 2022-04-20 DIAGNOSIS — Z992 Dependence on renal dialysis: Secondary | ICD-10-CM | POA: Diagnosis not present

## 2022-04-20 DIAGNOSIS — R52 Pain, unspecified: Secondary | ICD-10-CM | POA: Diagnosis not present

## 2022-04-20 DIAGNOSIS — D631 Anemia in chronic kidney disease: Secondary | ICD-10-CM | POA: Diagnosis not present

## 2022-04-20 DIAGNOSIS — N186 End stage renal disease: Secondary | ICD-10-CM | POA: Diagnosis not present

## 2022-04-21 ENCOUNTER — Other Ambulatory Visit (HOSPITAL_COMMUNITY): Payer: Self-pay

## 2022-04-24 DIAGNOSIS — R52 Pain, unspecified: Secondary | ICD-10-CM | POA: Diagnosis not present

## 2022-04-24 DIAGNOSIS — D631 Anemia in chronic kidney disease: Secondary | ICD-10-CM | POA: Diagnosis not present

## 2022-04-24 DIAGNOSIS — Z992 Dependence on renal dialysis: Secondary | ICD-10-CM | POA: Diagnosis not present

## 2022-04-24 DIAGNOSIS — T8249XD Other complication of vascular dialysis catheter, subsequent encounter: Secondary | ICD-10-CM | POA: Diagnosis not present

## 2022-04-24 DIAGNOSIS — N186 End stage renal disease: Secondary | ICD-10-CM | POA: Diagnosis not present

## 2022-04-24 DIAGNOSIS — N2581 Secondary hyperparathyroidism of renal origin: Secondary | ICD-10-CM | POA: Diagnosis not present

## 2022-04-27 DIAGNOSIS — R52 Pain, unspecified: Secondary | ICD-10-CM | POA: Diagnosis not present

## 2022-04-27 DIAGNOSIS — N2581 Secondary hyperparathyroidism of renal origin: Secondary | ICD-10-CM | POA: Diagnosis not present

## 2022-04-27 DIAGNOSIS — N186 End stage renal disease: Secondary | ICD-10-CM | POA: Diagnosis not present

## 2022-04-27 DIAGNOSIS — T8249XD Other complication of vascular dialysis catheter, subsequent encounter: Secondary | ICD-10-CM | POA: Diagnosis not present

## 2022-04-27 DIAGNOSIS — D631 Anemia in chronic kidney disease: Secondary | ICD-10-CM | POA: Diagnosis not present

## 2022-04-27 DIAGNOSIS — Z992 Dependence on renal dialysis: Secondary | ICD-10-CM | POA: Diagnosis not present

## 2022-04-28 ENCOUNTER — Ambulatory Visit (HOSPITAL_COMMUNITY): Payer: 59

## 2022-04-28 ENCOUNTER — Ambulatory Visit: Payer: 59

## 2022-04-29 DIAGNOSIS — Z992 Dependence on renal dialysis: Secondary | ICD-10-CM | POA: Diagnosis not present

## 2022-04-29 DIAGNOSIS — R52 Pain, unspecified: Secondary | ICD-10-CM | POA: Diagnosis not present

## 2022-04-29 DIAGNOSIS — N186 End stage renal disease: Secondary | ICD-10-CM | POA: Diagnosis not present

## 2022-04-29 DIAGNOSIS — T8249XD Other complication of vascular dialysis catheter, subsequent encounter: Secondary | ICD-10-CM | POA: Diagnosis not present

## 2022-04-29 DIAGNOSIS — D631 Anemia in chronic kidney disease: Secondary | ICD-10-CM | POA: Diagnosis not present

## 2022-04-29 DIAGNOSIS — N2581 Secondary hyperparathyroidism of renal origin: Secondary | ICD-10-CM | POA: Diagnosis not present

## 2022-04-30 ENCOUNTER — Ambulatory Visit: Payer: 59 | Attending: Student in an Organized Health Care Education/Training Program | Admitting: Physical Therapy

## 2022-05-01 DIAGNOSIS — N186 End stage renal disease: Secondary | ICD-10-CM | POA: Diagnosis not present

## 2022-05-01 DIAGNOSIS — R52 Pain, unspecified: Secondary | ICD-10-CM | POA: Diagnosis not present

## 2022-05-01 DIAGNOSIS — T8249XD Other complication of vascular dialysis catheter, subsequent encounter: Secondary | ICD-10-CM | POA: Diagnosis not present

## 2022-05-01 DIAGNOSIS — D631 Anemia in chronic kidney disease: Secondary | ICD-10-CM | POA: Diagnosis not present

## 2022-05-01 DIAGNOSIS — N2581 Secondary hyperparathyroidism of renal origin: Secondary | ICD-10-CM | POA: Diagnosis not present

## 2022-05-01 DIAGNOSIS — Z992 Dependence on renal dialysis: Secondary | ICD-10-CM | POA: Diagnosis not present

## 2022-05-04 ENCOUNTER — Other Ambulatory Visit: Payer: Self-pay | Admitting: Student

## 2022-05-04 DIAGNOSIS — N186 End stage renal disease: Secondary | ICD-10-CM | POA: Diagnosis not present

## 2022-05-04 DIAGNOSIS — T8249XD Other complication of vascular dialysis catheter, subsequent encounter: Secondary | ICD-10-CM | POA: Diagnosis not present

## 2022-05-04 DIAGNOSIS — Z89511 Acquired absence of right leg below knee: Secondary | ICD-10-CM

## 2022-05-04 DIAGNOSIS — E1122 Type 2 diabetes mellitus with diabetic chronic kidney disease: Secondary | ICD-10-CM | POA: Diagnosis not present

## 2022-05-04 DIAGNOSIS — D631 Anemia in chronic kidney disease: Secondary | ICD-10-CM | POA: Diagnosis not present

## 2022-05-04 DIAGNOSIS — N2581 Secondary hyperparathyroidism of renal origin: Secondary | ICD-10-CM | POA: Diagnosis not present

## 2022-05-04 DIAGNOSIS — Z992 Dependence on renal dialysis: Secondary | ICD-10-CM | POA: Diagnosis not present

## 2022-05-04 DIAGNOSIS — R52 Pain, unspecified: Secondary | ICD-10-CM | POA: Diagnosis not present

## 2022-05-04 DIAGNOSIS — G3184 Mild cognitive impairment, so stated: Secondary | ICD-10-CM

## 2022-05-05 ENCOUNTER — Encounter: Payer: Self-pay | Admitting: Student in an Organized Health Care Education/Training Program

## 2022-05-06 DIAGNOSIS — N186 End stage renal disease: Secondary | ICD-10-CM | POA: Diagnosis not present

## 2022-05-06 DIAGNOSIS — Z992 Dependence on renal dialysis: Secondary | ICD-10-CM | POA: Diagnosis not present

## 2022-05-06 DIAGNOSIS — N2581 Secondary hyperparathyroidism of renal origin: Secondary | ICD-10-CM | POA: Diagnosis not present

## 2022-05-06 DIAGNOSIS — D631 Anemia in chronic kidney disease: Secondary | ICD-10-CM | POA: Diagnosis not present

## 2022-05-06 DIAGNOSIS — T8249XD Other complication of vascular dialysis catheter, subsequent encounter: Secondary | ICD-10-CM | POA: Diagnosis not present

## 2022-05-11 ENCOUNTER — Telehealth: Payer: Self-pay | Admitting: *Deleted

## 2022-05-11 NOTE — Telephone Encounter (Signed)
Call from Patient's friend feels patient is not motivated to d anything now.  Refuses to get up for fear of falling. Suggestion that patient goes to a Skilled Nursing Facility for rehab.  Given an appointment for 05/12/2022 for assessment and referral.

## 2022-05-13 ENCOUNTER — Encounter (HOSPITAL_COMMUNITY): Payer: Self-pay

## 2022-05-13 ENCOUNTER — Emergency Department (HOSPITAL_COMMUNITY): Payer: 59

## 2022-05-13 ENCOUNTER — Inpatient Hospital Stay (HOSPITAL_COMMUNITY)
Admission: EM | Admit: 2022-05-13 | Discharge: 2022-05-18 | DRG: 291 | Disposition: A | Discharge status: Skilled Nursing Facility | Payer: 59 | Attending: Infectious Diseases | Admitting: Infectious Diseases

## 2022-05-13 ENCOUNTER — Encounter: Payer: 59 | Admitting: Student

## 2022-05-13 ENCOUNTER — Other Ambulatory Visit: Payer: Self-pay

## 2022-05-13 DIAGNOSIS — E785 Hyperlipidemia, unspecified: Secondary | ICD-10-CM | POA: Diagnosis present

## 2022-05-13 DIAGNOSIS — Z751 Person awaiting admission to adequate facility elsewhere: Secondary | ICD-10-CM | POA: Diagnosis not present

## 2022-05-13 DIAGNOSIS — I120 Hypertensive chronic kidney disease with stage 5 chronic kidney disease or end stage renal disease: Secondary | ICD-10-CM | POA: Diagnosis not present

## 2022-05-13 DIAGNOSIS — Z8249 Family history of ischemic heart disease and other diseases of the circulatory system: Secondary | ICD-10-CM

## 2022-05-13 DIAGNOSIS — G629 Polyneuropathy, unspecified: Secondary | ICD-10-CM | POA: Diagnosis not present

## 2022-05-13 DIAGNOSIS — I132 Hypertensive heart and chronic kidney disease with heart failure and with stage 5 chronic kidney disease, or end stage renal disease: Secondary | ICD-10-CM | POA: Diagnosis not present

## 2022-05-13 DIAGNOSIS — Z89612 Acquired absence of left leg above knee: Secondary | ICD-10-CM

## 2022-05-13 DIAGNOSIS — E871 Hypo-osmolality and hyponatremia: Secondary | ICD-10-CM | POA: Diagnosis present

## 2022-05-13 DIAGNOSIS — Z89611 Acquired absence of right leg above knee: Secondary | ICD-10-CM | POA: Diagnosis not present

## 2022-05-13 DIAGNOSIS — R531 Weakness: Secondary | ICD-10-CM | POA: Diagnosis not present

## 2022-05-13 DIAGNOSIS — N2581 Secondary hyperparathyroidism of renal origin: Secondary | ICD-10-CM | POA: Diagnosis not present

## 2022-05-13 DIAGNOSIS — R799 Abnormal finding of blood chemistry, unspecified: Secondary | ICD-10-CM

## 2022-05-13 DIAGNOSIS — I5022 Chronic systolic (congestive) heart failure: Secondary | ICD-10-CM | POA: Diagnosis present

## 2022-05-13 DIAGNOSIS — E876 Hypokalemia: Secondary | ICD-10-CM | POA: Diagnosis present

## 2022-05-13 DIAGNOSIS — R509 Fever, unspecified: Secondary | ICD-10-CM | POA: Diagnosis not present

## 2022-05-13 DIAGNOSIS — E1165 Type 2 diabetes mellitus with hyperglycemia: Secondary | ICD-10-CM | POA: Diagnosis present

## 2022-05-13 DIAGNOSIS — R739 Hyperglycemia, unspecified: Secondary | ICD-10-CM | POA: Diagnosis not present

## 2022-05-13 DIAGNOSIS — N189 Chronic kidney disease, unspecified: Secondary | ICD-10-CM

## 2022-05-13 DIAGNOSIS — Z992 Dependence on renal dialysis: Secondary | ICD-10-CM | POA: Diagnosis not present

## 2022-05-13 DIAGNOSIS — L942 Calcinosis cutis: Secondary | ICD-10-CM | POA: Diagnosis present

## 2022-05-13 DIAGNOSIS — Z89511 Acquired absence of right leg below knee: Secondary | ICD-10-CM

## 2022-05-13 DIAGNOSIS — E1140 Type 2 diabetes mellitus with diabetic neuropathy, unspecified: Secondary | ICD-10-CM | POA: Diagnosis not present

## 2022-05-13 DIAGNOSIS — I2510 Atherosclerotic heart disease of native coronary artery without angina pectoris: Secondary | ICD-10-CM | POA: Diagnosis present

## 2022-05-13 DIAGNOSIS — Z1152 Encounter for screening for COVID-19: Secondary | ICD-10-CM

## 2022-05-13 DIAGNOSIS — Z794 Long term (current) use of insulin: Secondary | ICD-10-CM

## 2022-05-13 DIAGNOSIS — N2889 Other specified disorders of kidney and ureter: Secondary | ICD-10-CM | POA: Diagnosis present

## 2022-05-13 DIAGNOSIS — Z888 Allergy status to other drugs, medicaments and biological substances status: Secondary | ICD-10-CM

## 2022-05-13 DIAGNOSIS — R52 Pain, unspecified: Secondary | ICD-10-CM | POA: Diagnosis not present

## 2022-05-13 DIAGNOSIS — Z89512 Acquired absence of left leg below knee: Secondary | ICD-10-CM

## 2022-05-13 DIAGNOSIS — M898X9 Other specified disorders of bone, unspecified site: Secondary | ICD-10-CM | POA: Diagnosis present

## 2022-05-13 DIAGNOSIS — I5040 Unspecified combined systolic (congestive) and diastolic (congestive) heart failure: Secondary | ICD-10-CM | POA: Diagnosis not present

## 2022-05-13 DIAGNOSIS — M79641 Pain in right hand: Secondary | ICD-10-CM | POA: Diagnosis present

## 2022-05-13 DIAGNOSIS — D631 Anemia in chronic kidney disease: Secondary | ICD-10-CM | POA: Diagnosis not present

## 2022-05-13 DIAGNOSIS — Z91158 Patient's noncompliance with renal dialysis for other reason: Secondary | ICD-10-CM

## 2022-05-13 DIAGNOSIS — E1151 Type 2 diabetes mellitus with diabetic peripheral angiopathy without gangrene: Secondary | ICD-10-CM | POA: Diagnosis not present

## 2022-05-13 DIAGNOSIS — N186 End stage renal disease: Secondary | ICD-10-CM | POA: Diagnosis not present

## 2022-05-13 DIAGNOSIS — I953 Hypotension of hemodialysis: Secondary | ICD-10-CM | POA: Diagnosis not present

## 2022-05-13 DIAGNOSIS — E1122 Type 2 diabetes mellitus with diabetic chronic kidney disease: Secondary | ICD-10-CM | POA: Diagnosis not present

## 2022-05-13 DIAGNOSIS — F17210 Nicotine dependence, cigarettes, uncomplicated: Secondary | ICD-10-CM | POA: Diagnosis not present

## 2022-05-13 DIAGNOSIS — K219 Gastro-esophageal reflux disease without esophagitis: Secondary | ICD-10-CM | POA: Diagnosis not present

## 2022-05-13 DIAGNOSIS — Z7982 Long term (current) use of aspirin: Secondary | ICD-10-CM

## 2022-05-13 DIAGNOSIS — G4700 Insomnia, unspecified: Secondary | ICD-10-CM | POA: Diagnosis present

## 2022-05-13 DIAGNOSIS — R0902 Hypoxemia: Secondary | ICD-10-CM | POA: Diagnosis not present

## 2022-05-13 DIAGNOSIS — Z833 Family history of diabetes mellitus: Secondary | ICD-10-CM

## 2022-05-13 DIAGNOSIS — I255 Ischemic cardiomyopathy: Secondary | ICD-10-CM | POA: Diagnosis present

## 2022-05-13 DIAGNOSIS — Z79899 Other long term (current) drug therapy: Secondary | ICD-10-CM

## 2022-05-13 DIAGNOSIS — Z5982 Transportation insecurity: Secondary | ICD-10-CM

## 2022-05-13 DIAGNOSIS — K5900 Constipation, unspecified: Secondary | ICD-10-CM | POA: Diagnosis present

## 2022-05-13 LAB — CBC
HCT: 32.2 % — ABNORMAL LOW (ref 39.0–52.0)
Hemoglobin: 10.9 g/dL — ABNORMAL LOW (ref 13.0–17.0)
MCH: 30.4 pg (ref 26.0–34.0)
MCHC: 33.9 g/dL (ref 30.0–36.0)
MCV: 89.9 fL (ref 80.0–100.0)
Platelets: 189 10*3/uL (ref 150–400)
RBC: 3.58 MIL/uL — ABNORMAL LOW (ref 4.22–5.81)
RDW: 11.9 % (ref 11.5–15.5)
WBC: 7.1 10*3/uL (ref 4.0–10.5)
nRBC: 0 % (ref 0.0–0.2)

## 2022-05-13 LAB — CBG MONITORING, ED
Glucose-Capillary: 255 mg/dL — ABNORMAL HIGH (ref 70–99)
Glucose-Capillary: 146 mg/dL — ABNORMAL HIGH (ref 70–99)
Glucose-Capillary: 165 mg/dL — ABNORMAL HIGH (ref 70–99)

## 2022-05-13 LAB — GLUCOSE, CAPILLARY
Glucose-Capillary: 237 mg/dL — ABNORMAL HIGH (ref 70–99)
Glucose-Capillary: 184 mg/dL — ABNORMAL HIGH (ref 70–99)

## 2022-05-13 LAB — BASIC METABOLIC PANEL
Anion gap: 10 (ref 5–15)
BUN: 46 mg/dL — ABNORMAL HIGH (ref 8–23)
CO2: 20 mmol/L — ABNORMAL LOW (ref 22–32)
Calcium: 7.9 mg/dL — ABNORMAL LOW (ref 8.9–10.3)
Chloride: 96 mmol/L — ABNORMAL LOW (ref 98–111)
Creatinine, Ser: 6.22 mg/dL — ABNORMAL HIGH (ref 0.61–1.24)
GFR, Estimated: 9 mL/min — ABNORMAL LOW (ref >60)
Glucose, Bld: 257 mg/dL — ABNORMAL HIGH (ref 70–99)
Potassium: 3.2 mmol/L — ABNORMAL LOW (ref 3.5–5.1)
Sodium: 126 mmol/L — ABNORMAL LOW (ref 135–145)

## 2022-05-13 LAB — SARS CORONAVIRUS 2 BY RT PCR: SARS Coronavirus 2 by RT PCR: NEGATIVE

## 2022-05-13 LAB — LACTIC ACID, PLASMA: Lactic Acid, Venous: 1 mmol/L (ref 0.5–1.9)

## 2022-05-13 LAB — HIV ANTIBODY (ROUTINE TESTING W REFLEX): HIV Screen 4th Generation wRfx: NONREACTIVE

## 2022-05-13 MED ORDER — CHLORHEXIDINE GLUCONATE CLOTH 2 % EX PADS
6.0000 | MEDICATED_PAD | Freq: Every day | CUTANEOUS | Status: DC
  Administered 2022-05-13 – 2022-05-14 (×2): 6 via TOPICAL

## 2022-05-13 MED ORDER — HEPARIN SODIUM (PORCINE) 5000 UNIT/ML IJ SOLN
5000.0000 [IU] | Freq: Three times a day (TID) | INTRAMUSCULAR | Status: DC
  Administered 2022-05-13 – 2022-05-18 (×15): 5000 [IU] via SUBCUTANEOUS
  Filled 2022-05-13 (×15): qty 1

## 2022-05-13 MED ORDER — DICLOFENAC SODIUM 1 % EX GEL
4.0000 g | Freq: Four times a day (QID) | CUTANEOUS | Status: DC
  Administered 2022-05-13 – 2022-05-18 (×15): 4 g via TOPICAL
  Filled 2022-05-13: qty 100

## 2022-05-13 MED ORDER — ACETAMINOPHEN 325 MG PO TABS
650.0000 mg | ORAL_TABLET | Freq: Once | ORAL | Status: AC
  Administered 2022-05-13: 650 mg via ORAL
  Filled 2022-05-13: qty 2

## 2022-05-13 MED ORDER — FENTANYL CITRATE PF 50 MCG/ML IJ SOSY
50.0000 ug | PREFILLED_SYRINGE | Freq: Once | INTRAMUSCULAR | Status: AC
  Administered 2022-05-13: 50 ug via INTRAVENOUS
  Filled 2022-05-13: qty 1

## 2022-05-13 MED ORDER — ASPIRIN 81 MG PO TBEC
81.0000 mg | DELAYED_RELEASE_TABLET | Freq: Every day | ORAL | Status: DC
  Administered 2022-05-13 – 2022-05-18 (×6): 81 mg via ORAL
  Filled 2022-05-13 (×6): qty 1

## 2022-05-13 MED ORDER — DULOXETINE HCL 60 MG PO CPEP
60.0000 mg | ORAL_CAPSULE | Freq: Every day | ORAL | Status: DC
  Administered 2022-05-13 – 2022-05-18 (×6): 60 mg via ORAL
  Filled 2022-05-13: qty 1
  Filled 2022-05-13: qty 2
  Filled 2022-05-13 (×3): qty 1
  Filled 2022-05-13: qty 2

## 2022-05-13 MED ORDER — ACETAMINOPHEN 325 MG PO TABS
650.0000 mg | ORAL_TABLET | Freq: Four times a day (QID) | ORAL | Status: DC | PRN
  Administered 2022-05-13: 650 mg via ORAL
  Filled 2022-05-13 (×3): qty 2

## 2022-05-13 MED ORDER — INSULIN ASPART 100 UNIT/ML IJ SOLN
0.0000 [IU] | Freq: Three times a day (TID) | INTRAMUSCULAR | Status: DC
  Administered 2022-05-13: 2 [IU] via SUBCUTANEOUS
  Administered 2022-05-14: 1 [IU] via SUBCUTANEOUS
  Administered 2022-05-14: 3 [IU] via SUBCUTANEOUS
  Administered 2022-05-15 – 2022-05-16 (×3): 1 [IU] via SUBCUTANEOUS
  Administered 2022-05-16: 2 [IU] via SUBCUTANEOUS
  Administered 2022-05-17: 1 [IU] via SUBCUTANEOUS
  Administered 2022-05-17: 2 [IU] via SUBCUTANEOUS
  Administered 2022-05-17: 1 [IU] via SUBCUTANEOUS

## 2022-05-13 MED ORDER — ROSUVASTATIN CALCIUM 5 MG PO TABS
10.0000 mg | ORAL_TABLET | Freq: Every day | ORAL | Status: DC
  Administered 2022-05-13 – 2022-05-18 (×6): 10 mg via ORAL
  Filled 2022-05-13 (×6): qty 2

## 2022-05-13 MED ORDER — EZETIMIBE 10 MG PO TABS
10.0000 mg | ORAL_TABLET | Freq: Every day | ORAL | Status: DC
  Administered 2022-05-13 – 2022-05-18 (×6): 10 mg via ORAL
  Filled 2022-05-13 (×6): qty 1

## 2022-05-13 MED ORDER — VANCOMYCIN HCL 1500 MG/300ML IV SOLN
1500.0000 mg | Freq: Once | INTRAVENOUS | Status: AC
  Administered 2022-05-13: 1500 mg via INTRAVENOUS
  Filled 2022-05-13: qty 300

## 2022-05-13 NOTE — Evaluation (Addendum)
Physical Therapy Evaluation Patient Details Name: [name] MRN: [id number] DOB: Nov 05, 1960 Today's Date: 05/13/2022  History of Present Illness  62 year old male adm 5/9 with history of ESRD on HD Tuesday/Thursday/Saturday, hypertension, diabetes, HFrEF, right AKA, left BKA who presented with reported hand pain and pain at both of his stumps. Concern for bacteremia.  Clinical Impression  Pt admitted with above diagnosis and presents to PT with functional limitations due to deficits listed below (See PT problem list). Pt needs skilled PT to maximize independence and safety.Patient will benefit from continued post acute therapy, <3 hours/day. Pt lives alone and needs to be independent with transfers to w/c to return there.           Recommendations for follow up therapy are one component of a multi-disciplinary discharge planning process, led by the attending physician.  Recommendations may be updated based on patient status, additional functional criteria and insurance authorization.  Follow Up Recommendations Can patient physically be transported by private vehicle: No     Assistance Recommended at Discharge Frequent or constant Supervision/Assistance  Patient can return home with the following  A little help with walking and/or transfers;A little help with bathing/dressing/bathroom;Help with stairs or ramp for entrance;Assist for transportation    Equipment Recommendations None recommended by PT  Recommendations for Other Services       Functional Status Assessment Patient has had a recent decline in their functional status and demonstrates the ability to make significant improvements in function in a reasonable and predictable amount of time.     Precautions / Restrictions Precautions Precautions: Fall Restrictions Other Position/Activity Restrictions: R prosthetic in room, L does not fit him, and he does not wear it.      Mobility  Bed Mobility Overal bed mobility: Needs  Assistance Bed Mobility: Supine to Sit, Sit to Supine     Supine to sit: HOB elevated, Mod assist Sit to supine: Min guard   General bed mobility comments: Assist to elevate trunk into sitting    Transfers                   General transfer comment: Pt too fatigued to attempt    Ambulation/Gait                  Stairs            Wheelchair Mobility    Modified Rankin (Stroke Patients Only)       Balance Overall balance assessment: Needs assistance Sitting-balance support: Feet unsupported, Single extremity supported Sitting balance-Leahy Scale: Poor Sitting balance - Comments: UE support                                     Pertinent Vitals/Pain Pain Assessment Pain Assessment: Faces Faces Pain Scale: Hurts even more Pain Location: R leg and L hand Pain Descriptors / Indicators: Stabbing, Pins and needles, Grimacing, Shooting, Sharp Pain Intervention(s): Limited activity within patient's tolerance, Repositioned    Home Living Family/patient expects to be discharged to:: Private residence Living Arrangements: Alone Available Help at Discharge: Family;Available PRN/intermittently Type of Home: Apartment Home Access: Stairs to enter   Entrance Stairs-Number of Steps: 2   Home Layout: One level Home Equipment: Cane - single point;Tub bench;Other (comment);Rolling Walker (2 wheels);Wheelchair - manual      Prior Function Prior Level of Function : Independent/Modified Independent  Mobility Comments: Generally has R prosthetic on and transfers to wheelchair per patient. ADLs Comments: Son assists with community mobility, provides supervision with showers.  Patient completes his own meal prep.     Hand Dominance   Dominant Hand: Right    Extremity/Trunk Assessment   Upper Extremity Assessment Upper Extremity Assessment: Defer to OT evaluation    Lower Extremity Assessment Lower Extremity Assessment: RLE  deficits/detail;LLE deficits/detail;Generalized weakness RLE Deficits / Details: BKA RLE Sensation: history of peripheral neuropathy LLE Deficits / Details: AKA LLE Sensation: history of peripheral neuropathy    Cervical / Trunk Assessment Cervical / Trunk Assessment: Normal  Communication   Communication: No difficulties  Cognition Arousal/Alertness: Awake/alert Behavior During Therapy: Flat affect Overall Cognitive Status: Within Functional Limits for tasks assessed                                          General Comments      Exercises     Assessment/Plan    PT Assessment Patient needs continued PT services  PT Problem List Decreased strength;Decreased balance;Decreased mobility;Decreased activity tolerance       PT Treatment Interventions DME instruction;Functional mobility training;Balance training;Patient/family education;Therapeutic activities;Therapeutic exercise    PT Goals (Current goals can be found in the Care Plan section)  Acute Rehab PT Goals Patient Stated Goal: return home PT Goal Formulation: With patient Time For Goal Achievement: 05/27/22 Potential to Achieve Goals: Good    Frequency Min 2X/week     Co-evaluation               AM-PAC PT "6 Clicks" Mobility  Outcome Measure Help needed turning from your back to your side while in a flat bed without using bedrails?: A Little Help needed moving from lying on your back to sitting on the side of a flat bed without using bedrails?: A Lot Help needed moving to and from a bed to a chair (including a wheelchair)?: Total Help needed standing up from a chair using your arms (e.g., wheelchair or bedside chair)?: Total Help needed to walk in hospital room?: Total Help needed climbing 3-5 steps with a railing? : Total 6 Click Score: 9    End of Session   Activity Tolerance: Patient limited by fatigue Patient left: in bed;with call bell/phone within reach;with bed alarm set;with  nursing/sitter in room Nurse Communication: Mobility status (400 cc urine emptied) PT Visit Diagnosis: Other abnormalities of gait and mobility (R26.89);Muscle weakness (generalized) (M62.81)    Time: 2952-8413 PT Time Calculation (min) (ACUTE ONLY): 17 min   Charges:   PT Evaluation $PT Eval Moderate Complexity: 1 Mod          [facility] PT Acute Rehabilitation Services Office [phone]   Angelina Ok [facility] 05/13/2022, 4:55 PM

## 2022-05-13 NOTE — ED Triage Notes (Signed)
Pt presents to ED for evaluation of bilateral hand and left shoulder pain. BGL was 430 via EMS.

## 2022-05-13 NOTE — ED Notes (Signed)
Patient has arrived from [facility]. Patient currently c/o of bilateral leg pain 10/10. Patient does dialysis T/TH/Sa and has not missed a treatment recently.

## 2022-05-13 NOTE — Hospital Course (Addendum)
Everything hurts, hands knubs etc Tired of everything hurting Tylenol doesn't help, needs something real for pain Dialysis port not bothering him No fever or chills No drainage from port Hungry Does hd on ttsa, didn't go this Tuesday Didn't go Tuesday because of transportation issues Weak like he's going to collapse No other symptoms  PMH: ESRD on TTSa HD T2DM HTN HLD Chronic pain***?  Hasn't taken medications since 5/7  Lives in apartment by himself.  No etoh, smokes cigarettes 1p/5d, no illicit substances Aunt used to help him around house some  Mom DM  Prosthesis doesn't fit No redness or drainage at stump L prosthesis doesn't fit well  Admitted 05/13/2022  Allergies: Benicar [olmesartan] Pertinent Hx: ESRD on TTSa HD, T2DM, HTN, HLD  62 y.o. male p/w "pain all over," needing HD  *ESRD, missed HD sessions: Will need non-emergent HD today. Needs social support.  Consults: Nephrology  Meds: insulin  VTE ppx: heparin  IVF: none  Diet: CM/HH    5/10 Pain in hand still occurring No fevers or chills No CP or sob Just feels weak Says his amputation stump bilaterally hurts, R>L at the tip No dysuria No diarrhea Didn't change dressing on catheter  5/13 Hands having pain but improving Asking what's causing hand pain Says hands werent like this until dialysis

## 2022-05-13 NOTE — Care Management Obs Status (Cosign Needed)
MEDICARE OBSERVATION STATUS NOTIFICATION   Patient Details  Name: [name] MRN: [id number] Date of Birth: [birth year]   Medicare Observation Status Notification Given:  Yes    Janae Bridgeman, RN 05/13/2022, 3:09 PM

## 2022-05-13 NOTE — ED Notes (Signed)
Report called to Victorino Dike RN at [facility] ER

## 2022-05-13 NOTE — ED Notes (Signed)
Got patient a [place] sandwich and a diet ginger ale patient is now sitting up eating with call bell in reach

## 2022-05-13 NOTE — ED Provider Notes (Signed)
After arrival at [facility] ED- internal medicine resident service can be called to admit patient   Zadie Rhine, MD 05/13/22 [phone]
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
## 2022-05-13 NOTE — TOC Initial Note (Addendum)
Transition of Care [facility]) - Initial/Assessment Note    Patient Details  Name: [name] MRN: [id number] Date of Birth: Jul 19, 1960  [facility] CM/SW Contact:    Janae Bridgeman, RN Phone Number: 05/13/2022, 4:23 PM  Clinical Narrative:                 Cm met with the patient at the bedside to discuss TOC needs.  The patient was admitted for bacteremia and presently under Medicare Observation.  The patient was provided with Medicare Observation letter at the bedside.  The patient lives at home alone and has assistance from his son, Ann Held for limited assistance.  The patient states that he is agreeable to SNF placement since he has had trouble with mobility at the home and is unable to live independently at this time.  The patient has bilateral prosthesis and one is present in the room for RLL at this time and family plans to bring the Left prosthesis to the hospital.  Patient states the LLL prosthesis for his AKA "was fitted by [employer] but has never fit right".  The patient has difficulty using [facility] transportation to HD on Tue, Thurs and Sat since he has steps and they will not assist him down the steps at his home.  DME at the home at this time includes, WC, RW, tub bench and Cane.  The patient placed his friend on the phone, Deloris Barner and she said she and the sister would help assist the patient with SNF placement and would prefer [facility] if possible.  SNF work up will be started by Kiva [place],, MSW in the am and will follow up with the patient.  Resources provided in the AVS include transportation, social services, smoking cessation.  Expected Discharge Plan: Skilled Nursing Facility Barriers to Discharge: Continued Medical Work up   Patient Goals and CMS Choice Patient states their goals for this hospitalization and ongoing recovery are:: To go to short term rehabilitation - SNF placement CMS Medicare.gov Compare Post Acute Care list provided to::  Patient Choice offered to / list presented to : Patient [place] ownership interest in [facility]provided to:: Patient    Expected Discharge Plan and Services   Discharge Planning Services: CM Consult Post Acute Care Choice: Skilled Nursing Facility Living arrangements for the past 2 months: Apartment                                      Prior Living Arrangements/Services Living arrangements for the past 2 months: Apartment Lives with:: Self Patient language and need for interpreter reviewed:: Yes Do you feel safe going back to the place where you live?: Yes (Patient would like SNF placeement first before returning home alone)      Need for Family Participation in Patient Care: Yes (Comment) Care giver support system in place?: Yes (comment) Current home services: DME (manual WC, Cane, tub bench, RW, patient has bilateral LLL/RLL prosthesis - Left prosthesis does not fit properly) Criminal Activity/Legal Involvement Pertinent to Current Situation/Hospitalization: No - Comment as needed  Activities of Daily Living Home Assistive Devices/Equipment: Wheelchair ADL Screening (condition at time of admission) Patient's cognitive ability adequate to safely complete daily activities?: Yes Is the patient deaf or have difficulty hearing?: No Does the patient have difficulty seeing, even when wearing glasses/contacts?: No Does the patient have difficulty concentrating, remembering, or making decisions?: No Patient able to  express need for assistance with ADLs?: Yes Does the patient have difficulty dressing or bathing?: Yes Independently performs ADLs?: Yes (appropriate for developmental age) Does the patient have difficulty walking or climbing stairs?: Yes Weakness of Legs: Both Weakness of Arms/Hands: None  Permission Sought/Granted Permission sought to share information with : Case Manager, Magazine features editor Permission granted to share information  with : Yes, Verbal Permission Granted        Permission granted to share info w Relationship: Haywood Filler - sister - [phone]     Emotional Assessment Appearance:: Appears stated age Attitude/Demeanor/Rapport: Gracious Affect (typically observed): Accepting Orientation: : Oriented to Self, Oriented to Place, Oriented to  Time, Oriented to Situation Alcohol / Substance Use: Not Applicable Psych Involvement: No (comment)  Admission diagnosis:  Hypokalemia [E87.6] Hyponatremia [E87.1] ESRD (end stage renal disease) (HCC) [N18.6] Anemia associated with chronic renal failure [N18.9, D63.1] Acute febrile illness [R50.9] ESRD needing dialysis (HCC) [N18.6, Z99.2] Patient Active Problem List   Diagnosis Date Noted   ESRD needing dialysis (HCC) 05/13/2022   Hypokalemia 05/13/2022   Tobacco use disorder 03/04/2022   Incontinence of urine 09/23/2021   Atherosclerosis of native arteries of extremities with gangrene, left leg (HCC)    Type 2 diabetes mellitus with diabetic peripheral angiopathy and gangrene, with long-term current use of insulin (HCC)    Infection of amputation stump, left lower extremity (HCC) 08/10/2021   Non-STEMI (non-ST elevated myocardial infarction) (HCC) 05/22/2021   ESRD on dialysis (HCC)    Colon polyps 03/03/2021   Mild cognitive impairment with memory loss 02/03/2021   Hx of medication noncompliance 09/30/2020   S/P BKA (below knee amputation) unilateral, right (HCC) 08/17/2018   Pulmonary nodules 02/16/2018   Diabetic neuropathy (HCC) 05/30/2014   GERD (gastroesophageal reflux disease) 05/25/2013   Heart failure with reduced ejection fraction and diastolic dysfunction (HCC) 03/27/2013   Healthcare maintenance 02/15/2013   Hyponatremia 01/06/2012   PVD (peripheral vascular disease) (HCC) 12/31/2011   HLD (hyperlipidemia) 03/06/2008   Hypertension, essential 03/06/2008   PCP:  Belva Agee, MD Pharmacy:   [facility] [address] [place] [address] Phone: [phone] Fax: [phone]  [place] [facility] [address] [place] [address] Phone: [phone] Fax: [phone]  [facility] [address] [place] [address] Phone: [phone] Fax: [phone]  [id number] [place], [place] - [address] AT [facility] [address] [place] [address] Phone: [phone] Fax: [phone]     Social Determinants of Health (SDOH) Social History: SDOH Screenings   Food Insecurity: No Food Insecurity (05/13/2022)  Housing: Low Risk  (05/13/2022)  Transportation Needs: No Transportation Needs (05/13/2022)  Utilities: Not At Risk (05/13/2022)  Depression (PHQ2-9): Low Risk  (02/01/2022)  Tobacco Use: High Risk (05/13/2022)   SDOH Interventions:     Readmission Risk Interventions    08/20/2021    3:13 PM  Readmission Risk Prevention Plan  Transportation Screening Complete  PCP or Specialist Appt within 3-5 Days Complete  HRI or Home Care Consult Complete  Social Work Consult for Recovery Care Planning/Counseling Complete  Palliative Care Screening Not Applicable  Medication Review Oceanographer) Complete
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
## 2022-05-13 NOTE — ED Provider Notes (Signed)
[facility] Provider Note   CSN: [id number] Arrival date & time: 05/13/22  0111     History  Chief Complaint  Patient presents with   Extremity Pain    [name] is a 62 y.o. male.  The history is provided by the patient.   Patient with history of end-stage renal disease on dialysis, hypertension, bilateral amputee presents with multiple complaints.  Patient presents because he reports he needs help with everything.  He reports both of his hands are hurting and his shoulder is hurting.  He also reports his glucose is elevated.  Patient reports his last dialysis session was on May 4 He is scheduled for dialysis tomorrow.  Patient is requesting food and drink immediately upon arrival   Past Medical History:  Diagnosis Date   ESRD on hemodialysis (HCC)    Gangrene (HCC)    right foot   GERD (gastroesophageal reflux disease)    HFrEF (heart failure with reduced ejection fraction) (HCC)    Hyperlipidemia    Hypertension    Neuromuscular disorder (HCC)    diabetic neruopathy - hands   Osteomyelitis (HCC) 2010   left foot, s/p midfoot amputation   Osteomyelitis (HCC) 09/2013   RT BKA   Osteomyelitis of ankle or foot 05/2011   rt foot, s/p 5th ray amputation   PAD (peripheral artery disease) (HCC)    Pneumonia 2010   Retroperitoneal hematoma    05/08/2021 renal biopsy complicated by retroperitoneal hematoma. 5/9 - 05/20/2021 H/H 6.9/20.9.  Imaging revealed large retroperitoneal hematoma.  S/P 2 units packed cells H/H stabilized with final values of 11.3/36.4.   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 Authorizing Provider  Accu-Chek FastClix Lancets MISC Check blood sugar 3 (three) times daily. 03/04/22   Katsadouros,  Vasilios, MD  amLODipine (NORVASC) 10 MG tablet Take 1 tablet (10 mg total) by mouth daily. Patient not taking: Reported on 08/10/2021 07/20/21   Belva Agee, MD  aspirin EC 81 MG tablet Take 1 tablet (81 mg total) by mouth daily. Swallow whole. Patient not taking: Reported on 07/02/2021 06/03/21   Medina-Vargas, Monina C, NP  blood glucose meter kit and supplies KIT Dispense based on patient and insurance preference. Use up to four times daily as directed. (FOR ICD-9 250.00, 250.01). 04/16/15   Denton Brick, MD  Blood Glucose Monitoring Suppl (BLOOD GLUCOSE MONITOR SYSTEM) w/Device KIT Use to check blood sugar 3 (three) times daily. 06/08/21   Medina-Vargas, Monina C, NP  carvedilol (COREG) 6.25 MG tablet Take 1 tablet (6.25 mg total) by mouth 2 (two) times daily with meals for hypertension 09/10/21     Continuous Blood Gluc Sensor (FREESTYLE LIBRE 2 SENSOR) MISC Place 1 sensor on the skin every 14 days. Use to check glucose continuously 02/05/21   Belva Agee, MD  Continuous Blood Gluc Sensor (FREESTYLE LIBRE 2 SENSOR) MISC Use as directed every 14 days 09/10/21   Schorr, Roma Kayser, FNP  Darbepoetin Alfa (ARANESP) 100 MCG/0.5ML SOSY injection Inject 0.5 mLs (100 mcg total) into the vein every Wednesday with hemodialysis. 05/20/21   Andrey Campanile, MD  DULoxetine (CYMBALTA) 30 MG capsule Take 2 capsules (60 mg total) by mouth daily. 11/18/21   Belva Agee, MD  ezetimibe (  ZETIA) 10 MG tablet Take 1 tablet (10 mg total) by mouth daily. 11/18/21   Katsadouros, Vasilios, MD  fluticasone (FLONASE) 50 MCG/ACT nasal spray Place 1 spray into both nostrils daily. Patient not taking: Reported on 08/10/2021 06/03/21   Medina-Vargas, Monina C, NP  glucose blood (ACCU-CHEK AVIVA PLUS) test strip Check blood sugar 3 times a day 06/04/21   Medina-Vargas, Monina C, NP  insulin aspart (NOVOLOG) 100 UNIT/ML FlexPen Inject 5 Units into the skin 3 (three) times daily with meals. 03/03/22    Katsadouros, Vasilios, MD  insulin glargine (LANTUS) 100 UNIT/ML Solostar Pen Inject 15 Units into the skin at bedtime. 03/03/22   Katsadouros, Vasilios, MD  Insulin Pen Needle 32G X 4 MM MISC Use to inject insulin 4 (four) times daily. 06/04/21   Medina-Vargas, Monina C, NP  isosorbide mononitrate (IMDUR) 30 MG 24 hr tablet Take 0.5 tablets (15 mg total) by mouth daily for ischemic cardiomyopathy 09/10/21     melatonin (KP MELATONIN) 3 MG TABS tablet Take 1 tablet (3 mg total) by mouth at bedtime for insomnia. 09/10/21     nicotine (NICODERM CQ - DOSED IN MG/24 HOURS) 21 mg/24hr patch Place 1 patch (21 mg total) onto the skin daily. 03/04/22   Belva Agee, MD  nicotine polacrilex (NICORETTE) 2 MG gum Take 1 each (2 mg total) by mouth as needed for smoking cessation. 03/04/22   Belva Agee, MD  oxyCODONE (OXY IR/ROXICODONE) 5 MG immediate release tablet Take 1 tablet (5 mg total) by mouth every 6 (six) hours as needed for pain 09/10/21     pantoprazole (PROTONIX) 40 MG tablet Take 1 tablet by mouth once a day 08/05/21     polyethylene glycol powder (GLYCOLAX/MIRALAX) 17 GM/SCOOP powder Take 17 g by mouth daily. 08/27/21     pregabalin (LYRICA) 25 MG capsule Take 1 capsule (25 mg total) by mouth daily. 04/07/22   Redwine, Madison A, PA-C  rosuvastatin (CRESTOR) 10 MG tablet Take 1 tablet (10 mg total) by mouth daily. 11/18/21   Belva Agee, MD  senna-docusate (SENEXON-S) 8.6-50 MG tablet Take 1 tablet by mouth at bedtime for constipation 09/10/21     varenicline (CHANTIX) 0.5 MG tablet Take 1 tablet (0.5 mg total) by mouth daily. 03/03/22   Belva Agee, MD      Allergies    Benicar [olmesartan]    Review of Systems   Review of Systems  Cardiovascular:  Negative for chest pain.  Musculoskeletal:  Positive for arthralgias.    Physical Exam Updated Vital Signs BP (!) 175/77   Pulse (!) 103   Temp 98.5 F (36.9 C) (Oral)   Resp (!) 22   Ht 1.727 m (5\' 8" )   Wt 71.5 kg    SpO2 100%   BMI 23.97 kg/m  Physical Exam CONSTITUTIONAL: Chronically ill-appearing HEAD: Normocephalic/atraumatic EYES: EOMI/PERRL ENMT: Mucous membranes moist NECK: supple no meningeal signs SPINE/BACK:entire spine nontender CV: S1/S2 noted, no murmurs/rubs/gallops noted LUNGS: Lungs are clear to auscultation bilaterally, no apparent distress ABDOMEN: soft, nontender NEURO: Pt is awake/alert/appropriate EXTREMITIES: Distal pulses equal and intact in both upper extremities.  Chronic changes noted to both hands.  No signs of cellulitis or abscess.  Right BKA noted, left AKA stump noted No signs of any trauma or infectious etiology to the stump SKIN: warm, color normal HD catheter noted to right chest, no drainage/erythema PSYCH: no abnormalities of mood noted, alert and oriented to situation  ED Results / Procedures / Treatments   Labs (all labs  ordered are listed, but only abnormal results are displayed) Labs Reviewed  BASIC METABOLIC PANEL - Abnormal; Notable for the following components:      Result Value   Sodium 126 (*)    Potassium 3.2 (*)    Chloride 96 (*)    CO2 20 (*)    Glucose, Bld 257 (*)    BUN 46 (*)    Creatinine, Ser 6.22 (*)    Calcium 7.9 (*)    GFR, Estimated 9 (*)    All other components within normal limits  CBC - Abnormal; Notable for the following components:   RBC 3.58 (*)    Hemoglobin 10.9 (*)    HCT 32.2 (*)    All other components within normal limits  CBG MONITORING, ED - Abnormal; Notable for the following components:   Glucose-Capillary 255 (*)    All other components within normal limits  CULTURE, BLOOD (ROUTINE X 2)  CULTURE, BLOOD (ROUTINE X 2)  URINE CULTURE  SARS CORONAVIRUS 2 BY RT PCR  LACTIC ACID, PLASMA  LACTIC ACID, PLASMA  URINALYSIS, ROUTINE W REFLEX MICROSCOPIC    EKG EKG Interpretation  Date/Time:  Thursday May 13 2022 01:53:47 EDT Ventricular Rate:  100 PR Interval:  133 QRS Duration: 96 QT  Interval:  348 QTC Calculation: 449 R Axis:   36 Text Interpretation: Sinus tachycardia Abnormal R-wave progression, early transition Confirmed by Zadie Rhine ([id number]) on 05/13/2022 2:03:19 AM  Radiology DG Chest Portable 1 View  Result Date: 05/13/2022 CLINICAL DATA:  Weakness EXAM: PORTABLE CHEST 1 VIEW COMPARISON:  08/10/2021 FINDINGS: The heart size and mediastinal contours are within normal limits. Both lungs are clear. The visualized skeletal structures are unremarkable. Right IJ central venous catheter tip in the proximal right atrium, unchanged. IMPRESSION: No active disease. Electronically Signed   By: Deatra Robinson M.D.   On: 05/13/2022 02:39    Procedures Procedures    Medications Ordered in ED Medications  vancomycin (VANCOREADY) IVPB 1500 mg/300 mL (1,500 mg Intravenous New Bag/Given 05/13/22 0326)  acetaminophen (TYLENOL) tablet 650 mg (650 mg Oral Given 05/13/22 0321)    ED Course/ Medical Decision Making/ A&P Clinical Course as of 05/13/22 0330  Thu May 13, 2022  0208 Patient is poor historian.  He presents because he reports he needs help with everything.  He mentions hand pain and joint pain.  He is chronically ill at baseline.  He is a dialysis patient has missed multiple sessions.  X-ray and labs are pending at this time [DW]  0310 Initial workup unrevealing, but on recheck oral temperature was 101.  Patient is reporting body aches.  He is also had cough.  Patient has a dialysis catheter which could be a source for his infection.  Blood cultures and lactic acid have been ordered.  Will start vancomycin and admit [DW]  0320 Discussed with Dr. Imogene Burn for admission.  Plan will be to transfer from this ER to the [facility] emergency department as patient will need dialysis unlikely to get a bed anytime soon.  He can then be admitted to the internal medicine residency service as he is their patient Accepted by dr Preston Fleeting for transfer [DW]    Clinical Course User Index [DW]  Zadie Rhine, MD                             Medical Decision Making Amount and/or Complexity of Data Reviewed Labs: ordered. Radiology: ordered. ECG/medicine tests:  ordered.  Risk OTC drugs. Prescription drug management. Decision regarding hospitalization.   This patient presents to the ED for concern of generalized weakness, this involves an extensive number of treatment options, and is a complaint that carries with it a high risk of complications and morbidity.  The differential diagnosis includes but is not limited to CVA, intracranial hemorrhage, acute coronary syndrome, renal failure, urinary tract infection, electrolyte disturbance, pneumonia    Comorbidities that complicate the patient evaluation: Patient's presentation is complicated by their history of end-stage renal disease, hypertension  Social Determinants of Health: Patient's  poor health literacy   increases the complexity of managing their presentation  Additional history obtained: Records reviewed Primary Care Documents  Lab Tests: I Ordered, and personally interpreted labs.  The pertinent results include:  renal failure  Imaging Studies ordered: I ordered imaging studies including X-ray chest   I independently visualized and interpreted imaging which showed no acute findings I agree with the radiologist interpretation  Cardiac Monitoring: The patient was maintained on a cardiac monitor.  I personally viewed and interpreted the cardiac monitor which showed an underlying rhythm of:  sinus rhythm  Medicines ordered and prescription drug management: I ordered medication including vancomycin  for presumed bacteremia  Reevaluation of the patient after these medicines showed that the patient    stayed the same   Critical Interventions:  admission for febrile illness, need for dialysis  Consultations Obtained: I requested consultation with the admitting physician triad dr Imogene Burn , and discussed  findings  as well as pertinent plan - they recommend: will transfer to [facility] ED, admitted to IM service  Reevaluation: After the interventions noted above, I reevaluated the patient and found that they have :stayed the same  Complexity of problems addressed: Patient's presentation is most consistent with  acute presentation with potential threat to life or bodily function  Disposition: After consideration of the diagnostic results and the patient's response to treatment,  I feel that the patent would benefit from admission   .           Final Clinical Impression(s) / ED Diagnoses Final diagnoses:  Acute febrile illness  ESRD (end stage renal disease) (HCC)    Rx / DC Orders ED Discharge Orders     None         Zadie Rhine, MD 05/13/22 0330

## 2022-05-13 NOTE — Evaluation (Signed)
Occupational Therapy Evaluation Patient Details Name: [name] MRN: [id number] DOB: 1960/05/25 Today's Date: 05/13/2022   History of Present Illness 62 year old male adm 5/9 with history of ESRD on HD Tuesday/Thursday/Saturday, hypertension, diabetes, HFrEF, right AKA, left BKA who presented with reported hand pain and pain at both of his stumps.   Clinical Impression   Patient admitted for the diagnosis above.  PTA he reports living alone with PRN assist from family.  At baseline he wears his R prosthetic and transfers to his wheelchair at baseline.  Patient states he prepares his own meals, but has supervision for showers from his son.  Pain to both hands and his R leg is the deficit, he is needing up to Min A for basic bed mobility, and closer to Mod A for lower body ADL due to B hand pain and difficulty gripping anything.  Patient declined donning R prosthetic due to pain, RN states pain meds have been ordered.  OT will continue efforts in the acute setting to address deficits, and Patient may benefit from continued inpatient follow up therapy, <3 hours/day, depending on pain control, progress and families ability to assist.        Recommendations for follow up therapy are one component of a multi-disciplinary discharge planning process, led by the attending physician.  Recommendations may be updated based on patient status, additional functional criteria and insurance authorization.   Assistance Recommended at Discharge Frequent or constant Supervision/Assistance  Patient can return home with the following Help with stairs or ramp for entrance;Assist for transportation;Assistance with cooking/housework;A little help with walking and/or transfers;A little help with bathing/dressing/bathroom    Functional Status Assessment  Patient has had a recent decline in their functional status and demonstrates the ability to make significant improvements in function in a reasonable and predictable  amount of time.  Equipment Recommendations  None recommended by OT    Recommendations for Other Services       Precautions / Restrictions Precautions Precautions: Fall Restrictions Weight Bearing Restrictions: No Other Position/Activity Restrictions: R prosthetic in room, L does not fit him, and he does not wear it.      Mobility Bed Mobility Overal bed mobility: Needs Assistance Bed Mobility: Supine to Sit, Sit to Supine     Supine to sit: Min assist Sit to supine: Supervision        Transfers                          Balance Overall balance assessment: Needs assistance Sitting-balance support: Feet unsupported, Single extremity supported Sitting balance-Leahy Scale: Fair                                     ADL either performed or assessed with clinical judgement   ADL       Grooming: Wash/dry hands;Wash/dry face;Min guard;Sitting Grooming Details (indicate cue type and reason): EOB             Lower Body Dressing: Minimal assistance;Sitting/lateral leans                       Vision Patient Visual Report: No change from baseline       Perception     Praxis      Pertinent Vitals/Pain Pain Assessment Pain Assessment: Faces Faces Pain Scale: Hurts whole lot Pain Location: R leg and L hand Pain Descriptors /  Indicators: Stabbing, Pins and needles, Grimacing, Shooting, Sharp Pain Intervention(s): Monitored during session     Hand Dominance Right   Extremity/Trunk Assessment Upper Extremity Assessment Upper Extremity Assessment: RUE deficits/detail;LUE deficits/detail RUE Deficits / Details: difficulty with full extension of fingers, weak grip. RUE Sensation: decreased light touch RUE Coordination: decreased fine motor LUE Deficits / Details: mm wasting to palm of his hand, weak grip, cannot fully extend fingers or make a full fist LUE Sensation: decreased light touch LUE Coordination: decreased fine motor    Lower Extremity Assessment Lower Extremity Assessment: Defer to PT evaluation   Cervical / Trunk Assessment Cervical / Trunk Assessment: Normal   Communication Communication Communication: No difficulties   Cognition Arousal/Alertness: Awake/alert Behavior During Therapy: Flat affect Overall Cognitive Status: Within Functional Limits for tasks assessed                                       General Comments       Exercises     Shoulder Instructions      Home Living Family/patient expects to be discharged to:: Private residence Living Arrangements: Alone Available Help at Discharge: Family;Available PRN/intermittently Type of Home: Apartment Home Access: Stairs to enter Entrance Stairs-Number of Steps: 2   Home Layout: One level     Bathroom Shower/Tub: Chief Strategy Officer: Standard Bathroom Accessibility: No   Home Equipment: Cane - single point;Tub bench;Other (comment);Rolling Walker (2 wheels);Wheelchair - manual          Prior Functioning/Environment Prior Level of Function : Independent/Modified Independent             Mobility Comments: Generally has R prosthetic on and transfers to wheelchair per patient. ADLs Comments: Son assists with community mobility, provides supervision with showers.  Patient completes his own meal prep.        OT Problem List: Decreased strength;Decreased range of motion;Decreased activity tolerance;Impaired balance (sitting and/or standing);Impaired UE functional use;Pain      OT Treatment/Interventions: Self-care/ADL training;Therapeutic exercise;Therapeutic activities;Balance training    OT Goals(Current goals can be found in the care plan section) Acute Rehab OT Goals Patient Stated Goal: Feel better and not hurt so much OT Goal Formulation: With patient Time For Goal Achievement: 05/27/22 Potential to Achieve Goals: Good ADL Goals Pt Will Perform Grooming: with set-up;sitting Pt  Will Perform Lower Body Dressing: with supervision;sitting/lateral leans Pt Will Transfer to Toilet: with supervision;stand pivot transfer;regular height toilet;bedside commode  OT Frequency: Min 2X/week    Co-evaluation              AM-PAC OT "6 Clicks" Daily Activity     Outcome Measure Help from another person eating meals?: A Little Help from another person taking care of personal grooming?: A Little Help from another person toileting, which includes using toliet, bedpan, or urinal?: A Lot Help from another person bathing (including washing, rinsing, drying)?: A Lot Help from another person to put on and taking off regular upper body clothing?: A Little Help from another person to put on and taking off regular lower body clothing?: A Lot 6 Click Score: 15   End of Session Nurse Communication: Mobility status  Activity Tolerance: Patient limited by pain Patient left: in bed;with call bell/phone within reach  OT Visit Diagnosis: Muscle weakness (generalized) (M62.81);Pain Pain - Right/Left: Left Pain - part of body: Hand  Time: 1610-9604 OT Time Calculation (min): 20 min Charges:  OT General Charges $OT Visit: 1 Visit OT Evaluation $OT Eval Moderate Complexity: 1 Mod  05/13/2022  RP, OTR/L  Acute Rehabilitation Services  Office:  [phone]   Suzanna Obey 05/13/2022, 12:26 PM

## 2022-05-13 NOTE — [place] FL2 (Signed)
[place] MEDICAID FL2 LEVEL OF CARE FORM     IDENTIFICATION  Patient Name: [name] Birthdate: 1960/06/29 Sex: male Admission Date (Current Location): 05/13/2022  [facility] and [place] Number:  Producer, television/film/video and Address:  [facility] [place], [address] [place], [place] [address]      Provider Number: [id number]  Attending Physician Name and Address:  Ginnie Smart, MD  Relative Name and Phone Number:       Current Level of Care: Hospital Recommended Level of Care: Skilled Nursing Facility Prior Approval Number:    Date Approved/Denied:   PASRR Number: [id number]  Discharge Plan: SNF    Current Diagnoses: Patient Active Problem List   Diagnosis Date Noted   ESRD needing dialysis (HCC) 05/13/2022   Hypokalemia 05/13/2022   Tobacco use disorder 03/04/2022   Incontinence of urine 09/23/2021   Atherosclerosis of native arteries of extremities with gangrene, left leg (HCC)    Type 2 diabetes mellitus with diabetic peripheral angiopathy and gangrene, with long-term current use of insulin (HCC)    Infection of amputation stump, left lower extremity (HCC) 08/10/2021   Non-STEMI (non-ST elevated myocardial infarction) (HCC) 05/22/2021   ESRD on dialysis (HCC)    Colon polyps 03/03/2021   Mild cognitive impairment with memory loss 02/03/2021   Hx of medication noncompliance 09/30/2020   S/P BKA (below knee amputation) unilateral, right (HCC) 08/17/2018   Pulmonary nodules 02/16/2018   Diabetic neuropathy (HCC) 05/30/2014   GERD (gastroesophageal reflux disease) 05/25/2013   Heart failure with reduced ejection fraction and diastolic dysfunction (HCC) 03/27/2013   Healthcare maintenance 02/15/2013   Hyponatremia 01/06/2012   PVD (peripheral vascular disease) (HCC) 12/31/2011   HLD (hyperlipidemia) 03/06/2008   Hypertension, essential 03/06/2008    Orientation RESPIRATION BLADDER Height & Weight     Self, Time, Situation, Place  Normal  Continent (Oliguria - HD patient) Weight: 71.5 kg Height:  5\' 8"  (172.7 cm)  BEHAVIORAL SYMPTOMS/MOOD NEUROLOGICAL BOWEL NUTRITION STATUS      Continent Diet  AMBULATORY STATUS COMMUNICATION OF NEEDS Skin   Extensive Assist Verbally Normal, Other (Comment) (bilateral AKA - patient has bilateral prosthesis at bedside from home)                       Personal Care Assistance Level of Assistance  Bathing, Feeding, Dressing Bathing Assistance: Limited assistance Feeding assistance: Independent Dressing Assistance: Limited assistance     Functional Limitations Info  Sight, Hearing, Speech Sight Info: Adequate Hearing Info: Adequate Speech Info: Adequate    SPECIAL CARE FACTORS FREQUENCY  PT (By licensed PT), OT (By licensed OT)     PT Frequency: 3-5 x per week OT Frequency: 3-5 x per week            Contractures Contractures Info: Not present    Additional Factors Info  Code Status, Allergies, Insulin Sliding Scale Code Status Info: Full code Allergies Info: Benicar   Insulin Sliding Scale Info: Very Sensitive Sliding Scale 3 x per day with meals       Current Medications (05/13/2022):  This is the current hospital active medication list Current Facility-Administered Medications  Medication Dose Route Frequency Provider Last Rate Last Admin   acetaminophen (TYLENOL) tablet 650 mg  650 mg Oral Q6H PRN Lyndle Herrlich, MD   650 mg at 05/13/22 1549   aspirin EC tablet 81 mg  81 mg Oral Daily Lyndle Herrlich, MD   81 mg at 05/13/22 1549  Chlorhexidine Gluconate Cloth 2 % PADS 6 each  6 each Topical Daily Ginnie Smart, MD   6 each at 05/13/22 1549   diclofenac Sodium (VOLTAREN) 1 % topical gel 4 g  4 g Topical QID Lyndle Herrlich, MD       DULoxetine (CYMBALTA) DR capsule 60 mg  60 mg Oral Daily Lyndle Herrlich, MD   60 mg at 05/13/22 1549   ezetimibe (ZETIA) tablet 10 mg  10 mg Oral Daily Lyndle Herrlich, MD   10 mg at 05/13/22  1549   heparin injection 5,000 Units  5,000 Units Subcutaneous Q8H Champ Mungo, DO   5,000 Units at 05/13/22 1549   insulin aspart (novoLOG) injection 0-6 Units  0-6 Units Subcutaneous TID WC Champ Mungo, DO       rosuvastatin (CRESTOR) tablet 10 mg  10 mg Oral Daily Lyndle Herrlich, MD   10 mg at 05/13/22 1549     Discharge Medications: Please see discharge summary for a list of discharge medications.  Relevant Imaging Results:  Relevant Lab Results:   Additional Information SSN [id number], HD on Tue, Thurs, Saturday  Janae Bridgeman, RN
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
## 2022-05-13 NOTE — Progress Notes (Signed)
A consult was received from an ED physician for Vancomycin per pharmacy dosing.  The patient's profile has been reviewed for ht/wt/allergies/indication/available labs.   A one time order has been placed for Vancomycin 1500mg  IV x1.  Further antibiotics/pharmacy consults should be ordered by admitting physician if indicated.                       Thank you, Junita Push PharmD 05/13/2022  3:14 AM

## 2022-05-13 NOTE — ED Provider Notes (Signed)
Patient transferred from [facility] for need for dialysis and need for hospital admission with fever and concern for possible bacteremia from dialysis access site.  Patient is resting comfortably and maintaining excellent oxygen saturation on room air.  I have reviewed his laboratory work and he is not hyperkalemic.  Dialysis does need to be done today but does not need to be done emergently.  I have discussed the case with Dr. August Saucer of internal medicine teaching service who agrees to admit the patient.   Dione Booze, MD 05/13/22 [id number]

## 2022-05-14 ENCOUNTER — Ambulatory Visit (HOSPITAL_COMMUNITY): Payer: 59

## 2022-05-14 ENCOUNTER — Encounter (HOSPITAL_COMMUNITY): Payer: Self-pay

## 2022-05-14 DIAGNOSIS — I120 Hypertensive chronic kidney disease with stage 5 chronic kidney disease or end stage renal disease: Secondary | ICD-10-CM | POA: Diagnosis not present

## 2022-05-14 DIAGNOSIS — E876 Hypokalemia: Secondary | ICD-10-CM | POA: Diagnosis not present

## 2022-05-14 DIAGNOSIS — E1122 Type 2 diabetes mellitus with diabetic chronic kidney disease: Secondary | ICD-10-CM | POA: Diagnosis not present

## 2022-05-14 DIAGNOSIS — G629 Polyneuropathy, unspecified: Secondary | ICD-10-CM

## 2022-05-14 DIAGNOSIS — N186 End stage renal disease: Secondary | ICD-10-CM | POA: Diagnosis not present

## 2022-05-14 DIAGNOSIS — E871 Hypo-osmolality and hyponatremia: Secondary | ICD-10-CM | POA: Diagnosis not present

## 2022-05-14 LAB — RENAL FUNCTION PANEL
Albumin: 3 g/dL — ABNORMAL LOW (ref 3.5–5.0)
Anion gap: 14 (ref 5–15)
BUN: 49 mg/dL — ABNORMAL HIGH (ref 8–23)
CO2: 17 mmol/L — ABNORMAL LOW (ref 22–32)
Calcium: 8.5 mg/dL — ABNORMAL LOW (ref 8.9–10.3)
Chloride: 100 mmol/L (ref 98–111)
Creatinine, Ser: 6 mg/dL — ABNORMAL HIGH (ref 0.61–1.24)
GFR, Estimated: 10 mL/min — ABNORMAL LOW (ref >60)
Glucose, Bld: 269 mg/dL — ABNORMAL HIGH (ref 70–99)
Phosphorus: 5.4 mg/dL — ABNORMAL HIGH (ref 2.5–4.6)
Potassium: 4 mmol/L (ref 3.5–5.1)
Sodium: 131 mmol/L — ABNORMAL LOW (ref 135–145)

## 2022-05-14 LAB — CBC
HCT: 33.3 % — ABNORMAL LOW (ref 39.0–52.0)
Hemoglobin: 11.5 g/dL — ABNORMAL LOW (ref 13.0–17.0)
MCH: 30.7 pg (ref 26.0–34.0)
MCHC: 34.5 g/dL (ref 30.0–36.0)
MCV: 89 fL (ref 80.0–100.0)
Platelets: 221 10*3/uL (ref 150–400)
RBC: 3.74 MIL/uL — ABNORMAL LOW (ref 4.22–5.81)
RDW: 11.9 % (ref 11.5–15.5)
WBC: 6.9 10*3/uL (ref 4.0–10.5)
nRBC: 0 % (ref 0.0–0.2)

## 2022-05-14 LAB — CULTURE, BLOOD (ROUTINE X 2)
Culture: NO GROWTH
Special Requests: ADEQUATE
Special Requests: ADEQUATE

## 2022-05-14 LAB — GLUCOSE, CAPILLARY
Glucose-Capillary: 102 mg/dL — ABNORMAL HIGH (ref 70–99)
Glucose-Capillary: 173 mg/dL — ABNORMAL HIGH (ref 70–99)
Glucose-Capillary: 225 mg/dL — ABNORMAL HIGH (ref 70–99)
Glucose-Capillary: 256 mg/dL — ABNORMAL HIGH (ref 70–99)

## 2022-05-14 LAB — HEPATITIS B SURFACE ANTIGEN: Hepatitis B Surface Ag: NONREACTIVE

## 2022-05-14 MED ORDER — CHLORHEXIDINE GLUCONATE CLOTH 2 % EX PADS: 6.0000 | MEDICATED_PAD | Freq: Every day | CUTANEOUS | Status: DC

## 2022-05-14 MED ORDER — HEPARIN SODIUM (PORCINE) 1000 UNIT/ML IJ SOLN
INTRAMUSCULAR | Status: AC
  Administered 2022-05-14: 3200 [IU]
  Filled 2022-05-14: qty 4

## 2022-05-14 MED ORDER — CARVEDILOL 6.25 MG PO TABS
6.2500 mg | ORAL_TABLET | Freq: Two times a day (BID) | ORAL | Status: DC
  Administered 2022-05-14: 6.25 mg via ORAL
  Filled 2022-05-14: qty 1

## 2022-05-14 MED ORDER — INSULIN GLARGINE-YFGN 100 UNIT/ML ~~LOC~~ SOLN
10.0000 [IU] | Freq: Every day | SUBCUTANEOUS | Status: DC
  Administered 2022-05-14 – 2022-05-15 (×2): 10 [IU] via SUBCUTANEOUS
  Filled 2022-05-14 (×3): qty 0.1

## 2022-05-14 MED ORDER — CHLORHEXIDINE GLUCONATE CLOTH 2 % EX PADS
6.0000 | MEDICATED_PAD | Freq: Every day | CUTANEOUS | Status: DC
  Administered 2022-05-14 – 2022-05-18 (×5): 6 via TOPICAL

## 2022-05-14 NOTE — Progress Notes (Signed)
Pt receives out-pt HD at [facility] on TTS. Pt arrives at 11:30 for 11:45 chair time. Will provide info to CSW for d/c planning purposes. Will assist as needed.    Olivia Canter Renal Navigator [phone]

## 2022-05-14 NOTE — Progress Notes (Signed)
   05/14/22 1923  Vitals  Pulse Rate 88  Resp 20  BP 132/64  SpO2 98 %  O2 Device Room Air  Post Treatment  Dialyzer Clearance Lightly streaked  Duration of HD Treatment -hour(s) 4 hour(s)  Hemodialysis Intake (mL) 0 mL  Liters Processed 91.8  Fluid Removed (mL) 1500 mL  Tolerated HD Treatment Yes   Received patient in bed to unit.  Alert and oriented.  Informed consent signed and in chart.   TX duration:4.0  Patient tolerated well.  Transported back to the room  Alert, without acute distress.  Hand-off given to patient's nurse.   Access used: [facility] Access issues: lines reversed  Total UF removed: 1500 Medication(s) givennone   [facility]

## 2022-05-14 NOTE — Progress Notes (Signed)
Subjective:   Summary: [name] is a 62 y.o. year old male currently admitted on the IMTS HD#0 for dialysis.  Overnight Events: NAEON.  Scheduled for HD yesterday, unable to see notes.  Remains afebrile, hypertensive, satting well on room air.  Pain is reported as well-managed overnight.  Patient reports that he did not get dialyzed yesterday.  Catheter did not get redressed for this reason.  Pain in hand still occurring, but improved with the Voltaren gel.  He also has pain in his bilateral lower extremity stumps, worse on the right BKA stump. No fevers or chills, chest pain or dyspnea, dysuria, or diarrhea.  He does endorse generalized weakness.  He has been tolerating p.o. intake.   Objective:  Vital signs in last 24 hours: Vitals:   05/13/22 2017 05/14/22 0005 05/14/22 0521 05/14/22 0757  BP: (!) 161/82 (!) 161/71 (!) 154/73 (!) 191/85  Pulse: 84 86 85 87  Resp: 18 18 18 18   Temp: 98.1 F (36.7 C)  97.9 F (36.6 C) 98 F (36.7 C)  TempSrc: Oral   Oral  SpO2: 100% 99% 100% 100%  Weight:      Height:       Supplemental O2: Room Air SpO2: 100 % O2 Flow Rate (L/min): 0 L/min   Physical Exam:  Constitutional: Chronically ill-appearing, NAD Cardiovascular: RRR, no murmurs, rubs or gallops Pulmonary/Chest: normal work of breathing on room air, lungs clear to auscultation bilaterally Abdominal: soft, non-tender, non-distended Skin: warm and dry, tunneled HD cath at right chest, dressed yesterday without any discharge. Extremities: Right BKA, left AKA with well-healed stumps.  No clear ulcerations, wounds, or drainage.  Developing ossifications over PIP and DIP joints of right hand.  Filed Weights   05/13/22 0116  Weight: 71.5 kg     Intake/Output Summary (Last 24 hours) at 05/14/2022 0923 Last data filed at 05/14/2022 0757 Gross per 24 hour  Intake --  Output 550 ml  Net -550 ml   Net IO Since Admission: -250 mL [05/14/22 0923]  Pertinent  Labs:    Latest Ref Rng & Units 05/14/2022    7:30 AM 05/13/2022    1:58 AM 09/22/2021    3:32 PM  CBC  WBC 4.0 - 10.5 K/uL 6.9  7.1  8.8   Hemoglobin 13.0 - 17.0 g/dL 40.9  81.1  91.4   Hematocrit 39.0 - 52.0 % 33.3  32.2  33.9   Platelets 150 - 400 K/uL 221  189  243        Latest Ref Rng & Units 05/14/2022    7:30 AM 05/13/2022    1:58 AM 09/22/2021    3:32 PM  CMP  Glucose 70 - 99 mg/dL 782  956  213   BUN 8 - 23 mg/dL 49  46  22   Creatinine 0.61 - 1.24 mg/dL 0.86  5.78  4.69   Sodium 135 - 145 mmol/L 131  126  136   Potassium 3.5 - 5.1 mmol/L 4.0  3.2  4.1   Chloride 98 - 111 mmol/L 100  96  97   CO2 22 - 32 mmol/L 17  20  22    Calcium 8.9 - 10.3 mg/dL 8.5  7.9  8.7    Blood cultures negative x 24 hours  Imaging: No results found.   EKG:   Assessment/Plan:   Principal Problem:   ESRD needing dialysis (HCC) Active  Problems:   Hyponatremia   Hypokalemia   Patient Summary: [name] is a 62 y.o. person living with a history of ESRD on HD, PAD, HFrEF, diabetes, hypertension, right AKA, left BKA who presented with "pain all over" and admitted for need for dialysis    # ESRD on HD T/Th/Sat Was not dialyzed yesterday, plan is to dialyze today and resume regular TTS HD schedule. -Regular TTS HD - Daily RFP, CBC -Would benefit additional social support   # Concern for systemic infection Remains afebrile with stable vitals.  No clear infectious symptoms or exam findings. Blood cultures negative x 24 hours. -Follow-up blood cultures -Continue to hold antibiotics -Trend CBC, temperature   #Distal extremity pain Has a history of neuropathic pain.  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  Phos today is 5.4, calcium is 8.5 (9.3 corrected).  Ulcerations do not seem to be infected at this time.  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 - Resumed home duloxetine 60 mg daily.  Voltaren gel over hands. Tylenol 650 mg every 6 as needed - Hectorol with HD   # Type 2 diabetes Last A1c was 8.2.  Unsure how much insulin he is actually taking at home.  placed on SSI yesterday.  Only received 2 units yesterday, postprandial CBGs are elevated between 140s and 230s.  -Continue sensitive SSI 3 times daily AC.  Adding on Semglee 10 today.   # Hypertension Mildly hypertensive.  Previously prescribed amlodipine 10 mg and carvedilol 6.125 mg twice daily.  Will reassess after HD. -Monitor blood pressure   #Hyperlipidemia Continue Crestor 10, Zetia  Diet: Renal IVF: None,None VTE: Heparin Code: Full PT/OT recs: SNF for Subacute PT, none. TOC recs:  Family Update:   Dispo: Anticipated discharge to Skilled nursing facility pending dialysis and ongoing workup for systemic infection  Lyndle Herrlich, MD PGY-1 Internal Medicine Resident Please contact the on call pager after 5 pm and on weekends at [phone].

## 2022-05-14 NOTE — Progress Notes (Signed)
[facility] Progress Note   62 y.o. male with PMH including ESRD on dialysis, DTN, DM, b/l BKA, who presented to the ED with body aches and missed HD. Patient is sleepy and is a poor historian, but reports "everything is achy" for several days. Missed dialysis Tuesday because not feeling well.  ED course notable for T 100.4F, Na 126, K+ 3.2, BUN 46, Cr 6.22, WBC 7.1, Hgb 10.9, Plt 189. CXR with no active disease. Blood cultures pending.   Dialysis Orders: Center: [facility]  on TTS . 180NRe 4 hours BFR 400 DFR 700 EDW 65kg 3K 2.5Ca TDC- placed 5.10.23, previously had AVF but developed steal symptoms, per vascular notes likely no other permanent access options No heparin bolus Hectorol IV q HD  Assessment/ Plan:    Fever/body aches: Concerning for systemic infection. Started on vancomycin but held today as temp normalized. Blood cultures are pending. If positive, patient will need a line holiday and agrees to this plan.  Hopefully we don't have to pull the catheter as he is catheter dependent.  ESRD:  TTS, actually last HD was 05/06/22 per outpatient notes. Will plan for HD today, resume TTS schedule.  Hypertension/volume: BP elevated on arrival, now improved. CXR unremarkable, 6.5kg over EDW if weights here are accurate, does not appear significantly overloaded on exam. UFG 3L today. On amlodipine and carvedilol outpatient but not sure what he is actually taking, follow BP post HD  Anemia: Hgb 10.9, no ESA indicated at this time.   Metabolic bone disease: Calcium low, will check albumin and phos. Continue hectorol.   T2DM: on insulin, per admitting team.  Subjective:   C/o pain in the hands; denies f/c/n/v/sob   Objective:   BP (!) 191/85 (BP Location: Right Arm)   Pulse 87   Temp 98 F (36.7 C) (Oral)   Resp 18   Ht 5\' 8"  (1.727 m)   Wt 71.5 kg   SpO2 100%   BMI 23.97 kg/m   Intake/Output Summary (Last 24 hours) at 05/14/2022 1321 Last data filed at  05/14/2022 1029 Gross per 24 hour  Intake --  Output 700 ml  Net -700 ml   Weight change:   Physical Exam: General: NAD Head: NCAT Neck: Supple  Lungs: CTA b/l, breathing is unlabored. Heart: RRR with normal S1, S2.  Abdomen: SNDNT obese Lower extremities: rt  BKA, lt AKA, no edema  Dialysis Access: [facility] with bandage, no visible drainage  Imaging: DG Chest Portable 1 View  Result Date: 05/13/2022 CLINICAL DATA:  Weakness EXAM: PORTABLE CHEST 1 VIEW COMPARISON:  08/10/2021 FINDINGS: The heart size and mediastinal contours are within normal limits. Both lungs are clear. The visualized skeletal structures are unremarkable. Right IJ central venous catheter tip in the proximal right atrium, unchanged. IMPRESSION: No active disease. Electronically Signed   By: Deatra Robinson M.D.   On: 05/13/2022 02:39    Labs: BMET Recent Labs  Lab 05/13/22 0158 05/14/22 0730  NA 126* 131*  K 3.2* 4.0  CL 96* 100  CO2 20* 17*  GLUCOSE 257* 269*  BUN 46* 49*  CREATININE 6.22* 6.00*  CALCIUM 7.9* 8.5*  PHOS  --  5.4*   CBC Recent Labs  Lab 05/13/22 0158 05/14/22 0730  WBC 7.1 6.9  HGB 10.9* 11.5*  HCT 32.2* 33.3*  MCV 89.9 89.0  PLT 189 221    Medications:     aspirin EC  81 mg Oral Daily   Chlorhexidine Gluconate Cloth  6  each Topical Daily   Chlorhexidine Gluconate Cloth  6 each Topical Q0600   diclofenac Sodium  4 g Topical QID   DULoxetine  60 mg Oral Daily   ezetimibe  10 mg Oral Daily   heparin  5,000 Units Subcutaneous Q8H   insulin aspart  0-6 Units Subcutaneous TID WC   insulin glargine-yfgn  10 Units Subcutaneous Daily   rosuvastatin  10 mg Oral Daily      Paulene Floor, MD 05/14/2022, 1:21 PM

## 2022-05-14 NOTE — TOC Progression Note (Signed)
Transition of Care [facility]) - Progression Note    Patient Details  Name: [name] MRN: [id number] Date of Birth: 1960/08/30  Transition of Care [facility]) CM/SW Contact  Crissy Mccreadie A [place], [place] Phone Number: 05/14/2022, 9:31 AM  Clinical Narrative:     CSW met with pt at bedside. He said he is agreeable to SNF and wanted CSW to speak with his sister [name] regarding SNF options.   CSW will follow up with sister regarding SNF choice.   TOC will continue to follow.   Expected Discharge Plan: Skilled Nursing Facility Barriers to Discharge: Continued Medical Work up  Expected Discharge Plan and Services   Discharge Planning Services: CM Consult Post Acute Care Choice: Skilled Nursing Facility Living arrangements for the past 2 months: Apartment                                       Social Determinants of Health (SDOH) Interventions SDOH Screenings   Food Insecurity: No Food Insecurity (05/13/2022)  Housing: Low Risk  (05/13/2022)  Transportation Needs: No Transportation Needs (05/13/2022)  Utilities: Not At Risk (05/13/2022)  Depression (PHQ2-9): Low Risk  (02/01/2022)  Tobacco Use: High Risk (05/13/2022)    Readmission Risk Interventions    08/20/2021    3:13 PM  Readmission Risk Prevention Plan  Transportation Screening Complete  PCP or Specialist Appt within 3-5 Days Complete  HRI or Home Care Consult Complete  Social Work Consult for Recovery Care Planning/Counseling Complete  Palliative Care Screening Not Applicable  Medication Review Oceanographer) Complete

## 2022-05-14 NOTE — Progress Notes (Signed)
Dressing noted to be in place from pre hospital placement. Pt is unable to state when it was placed. Dressing noted to be loose and tape is dislodged. Gauze noted to be covering the site. Cath ends are noted to be secured in a plastic bag and taped in place. When asked the patient reports that he had someone place that to prevent infection.Bag removed and dampness noted.   Dressing removed site assessed. Noted to have sutures surrounding the cath but none noted to be secured to the skin. Sterile dressing change completed, antimicrobial disc placed and transparent dressing applied. Caps are noted to have dead ends and secured with tape. Those have been left in place and will contact dialysis and provider for further instructions.

## 2022-05-14 NOTE — TOC Progression Note (Signed)
Transition of Care [facility]) - Progression Note    Patient Details  Name: [name] MRN: [id number] Date of Birth: 03-24-1960  Transition of Care [facility]) CM/SW Contact  Derrell Milanes A [place], [place] Phone Number: 05/14/2022, 9:36 AM  Clinical Narrative:     CSW contacted sister [name] regarding SNF choice for pt. She stated she was interested in [address] facility. CSW said that was [facility] and she said that was the facility pt wants to select.   CSW will confirm with pt and contact facility and start insurance authorization close to medically stable DC.   CSW will coordinate with Olivia Canter regarding HD center during SNF.   TOC will continue to follow.    Expected Discharge Plan: Skilled Nursing Facility Barriers to Discharge: Continued Medical Work up  Expected Discharge Plan and Services   Discharge Planning Services: CM Consult Post Acute Care Choice: Skilled Nursing Facility Living arrangements for the past 2 months: Apartment                                       Social Determinants of Health (SDOH) Interventions SDOH Screenings   Food Insecurity: No Food Insecurity (05/13/2022)  Housing: Low Risk  (05/13/2022)  Transportation Needs: No Transportation Needs (05/13/2022)  Utilities: Not At Risk (05/13/2022)  Depression (PHQ2-9): Low Risk  (02/01/2022)  Tobacco Use: High Risk (05/13/2022)    Readmission Risk Interventions    08/20/2021    3:13 PM  Readmission Risk Prevention Plan  Transportation Screening Complete  PCP or Specialist Appt within 3-5 Days Complete  HRI or Home Care Consult Complete  Social Work Consult for Recovery Care Planning/Counseling Complete  Palliative Care Screening Not Applicable  Medication Review Oceanographer) Complete

## 2022-05-15 DIAGNOSIS — I5040 Unspecified combined systolic (congestive) and diastolic (congestive) heart failure: Secondary | ICD-10-CM

## 2022-05-15 DIAGNOSIS — R509 Fever, unspecified: Secondary | ICD-10-CM

## 2022-05-15 DIAGNOSIS — Z89511 Acquired absence of right leg below knee: Secondary | ICD-10-CM | POA: Diagnosis not present

## 2022-05-15 DIAGNOSIS — N186 End stage renal disease: Secondary | ICD-10-CM | POA: Diagnosis not present

## 2022-05-15 DIAGNOSIS — Z794 Long term (current) use of insulin: Secondary | ICD-10-CM

## 2022-05-15 DIAGNOSIS — Z992 Dependence on renal dialysis: Secondary | ICD-10-CM | POA: Diagnosis not present

## 2022-05-15 DIAGNOSIS — D631 Anemia in chronic kidney disease: Secondary | ICD-10-CM | POA: Diagnosis not present

## 2022-05-15 DIAGNOSIS — Z89612 Acquired absence of left leg above knee: Secondary | ICD-10-CM | POA: Diagnosis not present

## 2022-05-15 DIAGNOSIS — N189 Chronic kidney disease, unspecified: Secondary | ICD-10-CM

## 2022-05-15 DIAGNOSIS — E1122 Type 2 diabetes mellitus with diabetic chronic kidney disease: Secondary | ICD-10-CM | POA: Diagnosis not present

## 2022-05-15 DIAGNOSIS — R52 Pain, unspecified: Secondary | ICD-10-CM | POA: Diagnosis not present

## 2022-05-15 LAB — CBC
HCT: 35.8 % — ABNORMAL LOW (ref 39.0–52.0)
Hemoglobin: 12.3 g/dL — ABNORMAL LOW (ref 13.0–17.0)
MCH: 30.2 pg (ref 26.0–34.0)
MCHC: 34.4 g/dL (ref 30.0–36.0)
MCV: 88 fL (ref 80.0–100.0)
Platelets: 229 10*3/uL (ref 150–400)
RBC: 4.07 MIL/uL — ABNORMAL LOW (ref 4.22–5.81)
RDW: 12.1 % (ref 11.5–15.5)
WBC: 7.7 10*3/uL (ref 4.0–10.5)
nRBC: 0 % (ref 0.0–0.2)

## 2022-05-15 LAB — GLUCOSE, CAPILLARY
Glucose-Capillary: 145 mg/dL — ABNORMAL HIGH (ref 70–99)
Glucose-Capillary: 189 mg/dL — ABNORMAL HIGH (ref 70–99)
Glucose-Capillary: 188 mg/dL — ABNORMAL HIGH (ref 70–99)

## 2022-05-15 LAB — RENAL FUNCTION PANEL
Albumin: 3.1 g/dL — ABNORMAL LOW (ref 3.5–5.0)
Anion gap: 15 (ref 5–15)
BUN: 18 mg/dL (ref 8–23)
CO2: 25 mmol/L (ref 22–32)
Calcium: 8.3 mg/dL — ABNORMAL LOW (ref 8.9–10.3)
Chloride: 93 mmol/L — ABNORMAL LOW (ref 98–111)
Creatinine, Ser: 3.6 mg/dL — ABNORMAL HIGH (ref 0.61–1.24)
GFR, Estimated: 18 mL/min — ABNORMAL LOW (ref >60)
Glucose, Bld: 171 mg/dL — ABNORMAL HIGH (ref 70–99)
Phosphorus: 3.8 mg/dL (ref 2.5–4.6)
Potassium: 3.3 mmol/L — ABNORMAL LOW (ref 3.5–5.1)
Sodium: 133 mmol/L — ABNORMAL LOW (ref 135–145)

## 2022-05-15 LAB — HEPATITIS B SURFACE ANTIBODY, QUANTITATIVE: Hep B S AB Quant (Post): 2448 m[IU]/mL (ref >9.9)

## 2022-05-15 MED ORDER — PREGABALIN 25 MG PO CAPS
25.0000 mg | ORAL_CAPSULE | Freq: Every day | ORAL | Status: DC
  Administered 2022-05-15 – 2022-05-18 (×4): 25 mg via ORAL
  Filled 2022-05-15 (×4): qty 1

## 2022-05-15 MED ORDER — HEPARIN SODIUM (PORCINE) 1000 UNIT/ML IJ SOLN
INTRAMUSCULAR | Status: AC
  Filled 2022-05-15: qty 4

## 2022-05-15 MED ORDER — ACETAMINOPHEN 500 MG PO TABS: 1000.0000 mg | ORAL_TABLET | Freq: Four times a day (QID) | ORAL | Status: DC | PRN

## 2022-05-15 NOTE — Progress Notes (Signed)
PHARMACY - PHYSICIAN COMMUNICATION CRITICAL VALUE ALERT - BLOOD CULTURE IDENTIFICATION (BCID)  [name] is an 62 y.o. male who presented to [facility] on 05/13/2022 with a chief complaint of body aches, missed iHD  Assessment:  1/2 Bcx with gram positive rods (aerobic bottle)  Name of physician (or Provider) Contacted:  Pager [phone]  Current antibiotics: none  Changes to prescribed antibiotics recommended:  None - likely contaminant 1/2   No results found for this or any previous visit.  [name], PharmD Clinical Pharmacist 05/15/2022 9:10 AM

## 2022-05-15 NOTE — Progress Notes (Signed)
Subjective:   Summary: [name] is a 62 y.o. year old male currently admitted on the IMTS HD#0 for dialysis.  Overnight Events: Had HD yesterday.  1.5L removed, tolerated well NAEON.  Still HD stable and afebrile.  Patient about 1 of 2 blood culture bottles (aerobic bottle) returning positive for gram-positive rods.  Likely contaminant.  Patient still having pain in his hands today which has been his greatest concern.  Feels about the same after dialysis yesterday.  No fevers/chills, dyspnea, chest pain, or any other symptoms.  Objective:  Vital signs in last 24 hours: Vitals:   05/14/22 1911 05/14/22 1923 05/14/22 2009 05/15/22 0441  BP: 98/62 132/64 (!) 108/57 117/65  Pulse: 62 88 90 86  Resp: 19 20 18 18   Temp:   (!) 97.5 F (36.4 C) 98.6 F (37 C)  TempSrc:   Oral Oral  SpO2: 98% 98% 100% 100%  Weight:      Height:       Supplemental O2: Room Air SpO2: 100 % O2 Flow Rate (L/min): 0 L/min   Physical Exam:  Constitutional: Chronically ill-appearing, NAD Cardiovascular: RRR, no murmurs, rubs or gallops Pulmonary/Chest: normal work of breathing on room air, lungs clear to auscultation bilaterally Abdominal: soft, non-tender, non-distended Skin: warm and dry, tunneled HD cath at right chest, dressed yesterday without any discharge. Extremities: Right BKA, left AKA with well-healed stumps.  No clear ulcerations, wounds, or drainage from LE.  Developing ossifications over PIP and DIP joints of right hand.  Filed Weights   05/13/22 0116 05/14/22 1450  Weight: 71.5 kg 71.5 kg     Intake/Output Summary (Last 24 hours) at 05/15/2022 0616 Last data filed at 05/14/2022 2230 Gross per 24 hour  Intake 240 ml  Output 1750 ml  Net -1510 ml    Net IO Since Admission: -1,660 mL [05/15/22 0616]  Pertinent Labs:    Latest Ref Rng & Units 05/15/2022    4:06 AM 05/14/2022    7:30 AM 05/13/2022    1:58 AM  CBC  WBC 4.0 - 10.5 K/uL 7.7  6.9  7.1   Hemoglobin  13.0 - 17.0 g/dL 16.1  09.6  04.5   Hematocrit 39.0 - 52.0 % 35.8  33.3  32.2   Platelets 150 - 400 K/uL 229  221  189        Latest Ref Rng & Units 05/15/2022    4:06 AM 05/14/2022    7:30 AM 05/13/2022    1:58 AM  CMP  Glucose 70 - 99 mg/dL 409  811  914   BUN 8 - 23 mg/dL 18  49  46   Creatinine 0.61 - 1.24 mg/dL 7.82  9.56  2.13   Sodium 135 - 145 mmol/L 133  131  126   Potassium 3.5 - 5.1 mmol/L 3.3  4.0  3.2   Chloride 98 - 111 mmol/L 93  100  96   CO2 22 - 32 mmol/L 25  17  20    Calcium 8.9 - 10.3 mg/dL 8.3  8.5  7.9    1 of 2 blood culture bottles positive for gram-positive rods, other one is negative still.  Imaging: No results found.   EKG:   Assessment/Plan:   Principal Problem:   ESRD needing dialysis (HCC) Active Problems:   Hyponatremia   Hypokalemia   Neuropathy   Patient Summary: [name] is a 62 y.o.  person living with a history of ESRD on HD, PAD, HFrEF, diabetes, hypertension, right AKA, left BKA who presented with "pain all over" and admitted for need for dialysis    # ESRD on HD T/Th/Sat Tolerated HD well yesterday, resume TThS HD -Regular TTS HD - Daily RFP, CBC -Would benefit additional social support   # Concern for systemic infection Remains afebrile with stable vitals.  No clear infectious symptoms or exam findings.  1/2 positive blood cultures for GPR's, this is likely contaminant.  Will hold off on starting antibiotics at this time unless he declares signs of infection -Follow-up blood cultures -Continue to hold antibiotics -Trend CBC, temperature   #Distal extremity pain Has a history of neuropathic pain.  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  Phos today is 3.8, calcium is 8.3 (9.0 corrected).  Ulcerations do not seem to be infected at this time.  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  Due to his persistent pain, will also add back on his Lyrica - Resumed home duloxetine 60 mg daily and Lyrica 25 mg daily.  Voltaren gel over hands. Tylenol 650 mg every 6h as needed - Hectorol with HD   # Type 2 diabetes Last A1c was 8.2.  Unsure how much insulin he is actually taking at home.  CBGs much better controlled on semglee 10 + 0-6 SSI -Continue sensitive SSI 3 times daily AC, Semglee 10   # Hypertension Normotensive after dialysis -Monitor blood pressure   #Hyperlipidemia Continue Crestor 10, Zetia  Diet: Renal IVF: None,None VTE: Heparin Code: Full PT/OT recs: SNF for Subacute PT TOC recs:  Family Update:   Dispo: Anticipated discharge to Skilled nursing facility   Lyndle Herrlich, MD PGY-1 Internal Medicine Resident Please contact the on call pager after 5 pm and on weekends at [phone].

## 2022-05-15 NOTE — Progress Notes (Signed)
Patient was taken for dialysis at 1530PM via bed.

## 2022-05-15 NOTE — Progress Notes (Signed)
[name] Progress Note   62 y.o. male with PMH including ESRD on dialysis, DTN, DM, b/l BKA, who presented to the ED with body aches and missed HD. Patient is sleepy and is a poor historian, but reports "everything is achy" for several days. Missed dialysis Tuesday because not feeling well.  ED course notable for T 100.54F, Na 126, K+ 3.2, BUN 46, Cr 6.22, WBC 7.1, Hgb 10.9, Plt 189. CXR with no active disease. Blood cultures pending.   Dialysis Orders: Center: [facility]  on TTS . 180NRe 4 hours BFR 400 DFR 700 EDW 65kg 3K 2.5Ca TDC- placed 5.10.23, previously had AVF but developed steal symptoms, per vascular notes likely no other permanent access options No heparin bolus Hectorol IV q HD  Assessment/ Plan:    Fever/body aches: Concerning for systemic infection. Started on vancomycin but held today as temp normalized. Blood cultures are still NTD. If positive, patient will need a line holiday and agrees to this plan.  Hopefully we don't have to pull the catheter as he is catheter dependent.  ESRD:  TTS, actually last HD was 05/06/22 per outpatient notes. Tolerated HD on Fri with 1.5L net UF with no cramping; HD again today to resume TTS schedule.  Hypertension/volume: BP elevated on arrival, now improved. CXR unremarkable, 6.5kg over EDW if weights here are accurate, does not appear significantly overloaded on exam. UFG 3L today. On amlodipine and carvedilol outpatient but not sure what he is actually taking, follow BP post HD  Anemia: Hgb 10.9, no ESA indicated at this time.   Metabolic bone disease: Calcium low, phos 3.8. Continue hectorol.   T2DM: on insulin, per admitting team.  Subjective:   C/o pain in the hands; denies f/c/n/v/sob   Objective:   BP 123/65 (BP Location: Left Arm)   Pulse 81   Temp 98 F (36.7 C) (Oral)   Resp 18   Ht 5\' 8"  (1.727 m)   Wt 71.5 kg   SpO2 100%   BMI 23.97 kg/m   Intake/Output Summary (Last 24 hours) at 05/15/2022  0900 Last data filed at 05/15/2022 0630 Gross per 24 hour  Intake 590 ml  Output 1650 ml  Net -1060 ml   Weight change:   Physical Exam: General: NAD Head: NCAT Neck: Supple  Lungs: CTA b/l, breathing is unlabored. Heart: RRR with normal S1, S2.  Abdomen: SNDNT obese Lower extremities: rt  BKA, lt AKA, no edema  Dialysis Access: TDC with bandage, no visible drainage  Imaging: No results found.  Labs: BMET Recent Labs  Lab 05/13/22 0158 05/14/22 0730 05/15/22 0406  NA 126* 131* 133*  K 3.2* 4.0 3.3*  CL 96* 100 93*  CO2 20* 17* 25  GLUCOSE 257* 269* 171*  BUN 46* 49* 18  CREATININE 6.22* 6.00* 3.60*  CALCIUM 7.9* 8.5* 8.3*  PHOS  --  5.4* 3.8   CBC Recent Labs  Lab 05/13/22 0158 05/14/22 0730 05/15/22 0406  WBC 7.1 6.9 7.7  HGB 10.9* 11.5* 12.3*  HCT 32.2* 33.3* 35.8*  MCV 89.9 89.0 88.0  PLT 189 221 229    Medications:     aspirin EC  81 mg Oral Daily   Chlorhexidine Gluconate Cloth  6 each Topical Q0600   diclofenac Sodium  4 g Topical QID   DULoxetine  60 mg Oral Daily   ezetimibe  10 mg Oral Daily   heparin  5,000 Units Subcutaneous Q8H   insulin aspart  0-6 Units Subcutaneous TID  WC   insulin glargine-yfgn  10 Units Subcutaneous Daily   rosuvastatin  10 mg Oral Daily      Paulene Floor, MD 05/15/2022, 9:00 AM

## 2022-05-15 NOTE — Progress Notes (Signed)
Received patient in bed to unit.  Alert and oriented.  Informed consent signed and in chart.   TX duration:3.5hours  Patient tolerated well. UF goal not reached due to pt being hypotensive, but asymptomatic Transported back to the room  Alert, without acute distress.  Hand-off given to patient's nurse.   Access used: catheter Access issues: none  Total UF removed: 1000 mls Medication(s) given: none Post HD VS: 115/61 Post HD weight: unable to obtain    05/15/22 2033  Vitals  Temp 98.5 F (36.9 C)  Temp Source Oral  BP 115/61  MAP (mmHg) 68  BP Location Right Arm  BP Method Automatic  Patient Position (if appropriate) Lying  Pulse Rate 63  Pulse Rate Source Monitor  Oxygen Therapy  SpO2 99 %  O2 Device Room Air  O2 Flow Rate (L/min) 0 L/min  Patient Activity (if Appropriate) In bed  Pulse Oximetry Type Continuous  Post Treatment  Dialyzer Clearance Lightly streaked  Duration of HD Treatment -hour(s) 3.5 hour(s)  Hemodialysis Intake (mL) 0 mL  Liters Processed 76.4  Fluid Removed (mL) 1000 mL  Tolerated HD Treatment No (Comment)  Post-Hemodialysis Comments HD tx completed as expected, UF goal not reached due to pt being hypotensive, but asymptomatic, no complaints, pt is stable.  AVG/AVF Arterial Site Held (minutes) 0 minutes  AVG/AVF Venous Site Held (minutes) 0 minutes  Note  Observations pt is in bed resting, alert, oriented and verbally responsive.  Hemodialysis Catheter Right Internal jugular Double lumen Permanent (Tunneled)  Placement Date/Time: 05/13/21 1657   Placed prior to admission: No  Time Out: Correct patient;Correct site;Correct procedure  Maximum sterile barrier precautions: Hand hygiene;Cap;Mask;Sterile gown;Sterile gloves;Large sterile sheet  Site Prep: Chlorh...  Site Condition No complications  Blue Lumen Status Flushed;Heparin locked;Dead end cap in place  Red Lumen Status Flushed;Heparin locked;Dead end cap in place  Purple Lumen Status N/A   Catheter fill solution Heparin 1000 units/ml  Catheter fill volume (Arterial) 1.6 cc  Catheter fill volume (Venous) 1.6  Dressing Type Transparent  Dressing Status Antimicrobial disc in place  Drainage Description None  Post treatment catheter status Capped and Clamped

## 2022-05-16 DIAGNOSIS — I739 Peripheral vascular disease, unspecified: Secondary | ICD-10-CM | POA: Diagnosis not present

## 2022-05-16 DIAGNOSIS — R739 Hyperglycemia, unspecified: Secondary | ICD-10-CM | POA: Diagnosis not present

## 2022-05-16 DIAGNOSIS — E441 Mild protein-calorie malnutrition: Secondary | ICD-10-CM | POA: Diagnosis not present

## 2022-05-16 DIAGNOSIS — E876 Hypokalemia: Secondary | ICD-10-CM | POA: Diagnosis not present

## 2022-05-16 DIAGNOSIS — F17210 Nicotine dependence, cigarettes, uncomplicated: Secondary | ICD-10-CM | POA: Diagnosis not present

## 2022-05-16 DIAGNOSIS — I953 Hypotension of hemodialysis: Secondary | ICD-10-CM | POA: Diagnosis not present

## 2022-05-16 DIAGNOSIS — M79641 Pain in right hand: Secondary | ICD-10-CM | POA: Diagnosis present

## 2022-05-16 DIAGNOSIS — I5022 Chronic systolic (congestive) heart failure: Secondary | ICD-10-CM | POA: Diagnosis present

## 2022-05-16 DIAGNOSIS — Z743 Need for continuous supervision: Secondary | ICD-10-CM | POA: Diagnosis not present

## 2022-05-16 DIAGNOSIS — E8809 Other disorders of plasma-protein metabolism, not elsewhere classified: Secondary | ICD-10-CM | POA: Diagnosis not present

## 2022-05-16 DIAGNOSIS — E1165 Type 2 diabetes mellitus with hyperglycemia: Secondary | ICD-10-CM | POA: Diagnosis present

## 2022-05-16 DIAGNOSIS — K219 Gastro-esophageal reflux disease without esophagitis: Secondary | ICD-10-CM | POA: Diagnosis not present

## 2022-05-16 DIAGNOSIS — R52 Pain, unspecified: Secondary | ICD-10-CM

## 2022-05-16 DIAGNOSIS — Z89612 Acquired absence of left leg above knee: Secondary | ICD-10-CM | POA: Diagnosis not present

## 2022-05-16 DIAGNOSIS — N250 Renal osteodystrophy: Secondary | ICD-10-CM | POA: Diagnosis not present

## 2022-05-16 DIAGNOSIS — N186 End stage renal disease: Secondary | ICD-10-CM | POA: Diagnosis present

## 2022-05-16 DIAGNOSIS — Z89611 Acquired absence of right leg above knee: Secondary | ICD-10-CM | POA: Diagnosis not present

## 2022-05-16 DIAGNOSIS — Z7401 Bed confinement status: Secondary | ICD-10-CM | POA: Diagnosis not present

## 2022-05-16 DIAGNOSIS — Z23 Encounter for immunization: Secondary | ICD-10-CM | POA: Diagnosis not present

## 2022-05-16 DIAGNOSIS — R531 Weakness: Secondary | ICD-10-CM | POA: Diagnosis not present

## 2022-05-16 DIAGNOSIS — I10 Essential (primary) hypertension: Secondary | ICD-10-CM | POA: Diagnosis not present

## 2022-05-16 DIAGNOSIS — R799 Abnormal finding of blood chemistry, unspecified: Secondary | ICD-10-CM | POA: Diagnosis not present

## 2022-05-16 DIAGNOSIS — I5040 Unspecified combined systolic (congestive) and diastolic (congestive) heart failure: Secondary | ICD-10-CM | POA: Diagnosis not present

## 2022-05-16 DIAGNOSIS — E871 Hypo-osmolality and hyponatremia: Secondary | ICD-10-CM | POA: Diagnosis not present

## 2022-05-16 DIAGNOSIS — Z992 Dependence on renal dialysis: Secondary | ICD-10-CM | POA: Diagnosis not present

## 2022-05-16 DIAGNOSIS — G4700 Insomnia, unspecified: Secondary | ICD-10-CM | POA: Diagnosis not present

## 2022-05-16 DIAGNOSIS — I120 Hypertensive chronic kidney disease with stage 5 chronic kidney disease or end stage renal disease: Secondary | ICD-10-CM | POA: Diagnosis not present

## 2022-05-16 DIAGNOSIS — E785 Hyperlipidemia, unspecified: Secondary | ICD-10-CM | POA: Diagnosis not present

## 2022-05-16 DIAGNOSIS — Z1152 Encounter for screening for COVID-19: Secondary | ICD-10-CM | POA: Diagnosis not present

## 2022-05-16 DIAGNOSIS — Z95828 Presence of other vascular implants and grafts: Secondary | ICD-10-CM | POA: Diagnosis not present

## 2022-05-16 DIAGNOSIS — E1151 Type 2 diabetes mellitus with diabetic peripheral angiopathy without gangrene: Secondary | ICD-10-CM | POA: Diagnosis present

## 2022-05-16 DIAGNOSIS — M6281 Muscle weakness (generalized): Secondary | ICD-10-CM | POA: Diagnosis not present

## 2022-05-16 DIAGNOSIS — J309 Allergic rhinitis, unspecified: Secondary | ICD-10-CM | POA: Diagnosis not present

## 2022-05-16 DIAGNOSIS — Z751 Person awaiting admission to adequate facility elsewhere: Secondary | ICD-10-CM | POA: Diagnosis not present

## 2022-05-16 DIAGNOSIS — I252 Old myocardial infarction: Secondary | ICD-10-CM | POA: Diagnosis not present

## 2022-05-16 DIAGNOSIS — R509 Fever, unspecified: Secondary | ICD-10-CM | POA: Diagnosis not present

## 2022-05-16 DIAGNOSIS — L942 Calcinosis cutis: Secondary | ICD-10-CM | POA: Diagnosis present

## 2022-05-16 DIAGNOSIS — Z89511 Acquired absence of right leg below knee: Secondary | ICD-10-CM | POA: Diagnosis not present

## 2022-05-16 DIAGNOSIS — I132 Hypertensive heart and chronic kidney disease with heart failure and with stage 5 chronic kidney disease, or end stage renal disease: Secondary | ICD-10-CM | POA: Diagnosis present

## 2022-05-16 DIAGNOSIS — E1122 Type 2 diabetes mellitus with diabetic chronic kidney disease: Secondary | ICD-10-CM | POA: Diagnosis not present

## 2022-05-16 DIAGNOSIS — G3184 Mild cognitive impairment, so stated: Secondary | ICD-10-CM | POA: Diagnosis not present

## 2022-05-16 DIAGNOSIS — N2581 Secondary hyperparathyroidism of renal origin: Secondary | ICD-10-CM | POA: Diagnosis present

## 2022-05-16 DIAGNOSIS — D631 Anemia in chronic kidney disease: Secondary | ICD-10-CM | POA: Diagnosis not present

## 2022-05-16 DIAGNOSIS — M898X9 Other specified disorders of bone, unspecified site: Secondary | ICD-10-CM | POA: Diagnosis present

## 2022-05-16 DIAGNOSIS — I2510 Atherosclerotic heart disease of native coronary artery without angina pectoris: Secondary | ICD-10-CM | POA: Diagnosis not present

## 2022-05-16 DIAGNOSIS — E1140 Type 2 diabetes mellitus with diabetic neuropathy, unspecified: Secondary | ICD-10-CM | POA: Diagnosis not present

## 2022-05-16 DIAGNOSIS — Z89512 Acquired absence of left leg below knee: Secondary | ICD-10-CM | POA: Diagnosis not present

## 2022-05-16 DIAGNOSIS — Z794 Long term (current) use of insulin: Secondary | ICD-10-CM | POA: Diagnosis not present

## 2022-05-16 LAB — CBC
HCT: 37.3 % — ABNORMAL LOW (ref 39.0–52.0)
Hemoglobin: 12.3 g/dL — ABNORMAL LOW (ref 13.0–17.0)
MCH: 29.8 pg (ref 26.0–34.0)
MCHC: 33 g/dL (ref 30.0–36.0)
MCV: 90.3 fL (ref 80.0–100.0)
Platelets: 208 10*3/uL (ref 150–400)
RBC: 4.13 MIL/uL — ABNORMAL LOW (ref 4.22–5.81)
RDW: 12.1 % (ref 11.5–15.5)
WBC: 6.6 10*3/uL (ref 4.0–10.5)
nRBC: 0 % (ref 0.0–0.2)

## 2022-05-16 LAB — RENAL FUNCTION PANEL
Albumin: 3.3 g/dL — ABNORMAL LOW (ref 3.5–5.0)
Anion gap: 10 (ref 5–15)
BUN: 11 mg/dL (ref 8–23)
CO2: 26 mmol/L (ref 22–32)
Calcium: 8.5 mg/dL — ABNORMAL LOW (ref 8.9–10.3)
Chloride: 97 mmol/L — ABNORMAL LOW (ref 98–111)
Creatinine, Ser: 3.29 mg/dL — ABNORMAL HIGH (ref 0.61–1.24)
GFR, Estimated: 20 mL/min — ABNORMAL LOW (ref >60)
Glucose, Bld: 230 mg/dL — ABNORMAL HIGH (ref 70–99)
Phosphorus: 3.4 mg/dL (ref 2.5–4.6)
Potassium: 3.6 mmol/L (ref 3.5–5.1)
Sodium: 133 mmol/L — ABNORMAL LOW (ref 135–145)

## 2022-05-16 LAB — GLUCOSE, CAPILLARY
Glucose-Capillary: 188 mg/dL — ABNORMAL HIGH (ref 70–99)
Glucose-Capillary: 217 mg/dL — ABNORMAL HIGH (ref 70–99)
Glucose-Capillary: 187 mg/dL — ABNORMAL HIGH (ref 70–99)
Glucose-Capillary: 213 mg/dL — ABNORMAL HIGH (ref 70–99)

## 2022-05-16 LAB — CULTURE, BLOOD (ROUTINE X 2): Culture: NO GROWTH

## 2022-05-16 MED ORDER — INSULIN GLARGINE-YFGN 100 UNIT/ML ~~LOC~~ SOLN
12.0000 [IU] | Freq: Every day | SUBCUTANEOUS | Status: DC
  Administered 2022-05-16: 12 [IU] via SUBCUTANEOUS
  Filled 2022-05-16: qty 0.12

## 2022-05-16 MED ORDER — HYDROMORPHONE HCL 2 MG PO TABS
1.0000 mg | ORAL_TABLET | Freq: Once | ORAL | Status: AC
  Administered 2022-05-16: 1 mg via ORAL
  Filled 2022-05-16: qty 1

## 2022-05-16 NOTE — Progress Notes (Addendum)
HD#0 Subjective:  Overnight Events: none  Patient assessed at bedside this morning. He reports that he feels tired. He continues to have some pain in his hands bilaterally but feels that pain has improved with lyrica and diclofenac gel. The pain in his residual limbs has improved.  Pt is updated on the plan for today, and all questions and concerns are addressed.   Objective:  Vital signs in last 24 hours: Vitals:   05/15/22 2019 05/15/22 2033 05/15/22 2217 05/16/22 0429  BP: (!) 88/51 115/61 115/64 128/62  Pulse: (!) 39 63 79 86  Resp: 16  17 17   Temp:  98.5 F (36.9 C) 98.5 F (36.9 C) 98 F (36.7 C)  TempSrc:  Oral    SpO2: (!) 74% 99% 98% 100%  Weight:      Height:       Supplemental O2: Room Air SpO2: 100 % O2 Flow Rate (L/min): 0 L/min   Physical Exam:  Constitutional: well-appearing, in no acute distress Cardiovascular: regular rate and rhythm, no m/r/g, HD line in right IJ with no erythma Pulmonary/Chest: normal work of breathing on room air, lungs clear to auscultation bilaterally Abdominal: soft, non-tender, non-distended MSK: residual limbs with no erythema or skin changes Neurological: Alert and oriented x3 Skin: warm and dry Psych: pleasant  Filed Weights   05/13/22 0116 05/14/22 1450 05/15/22 1610  Weight: 71.5 kg 71.5 kg 64.9 kg     Intake/Output Summary (Last 24 hours) at 05/16/2022 0621 Last data filed at 05/15/2022 2033 Gross per 24 hour  Intake 750 ml  Output 1200 ml  Net -450 ml   Net IO Since Admission: -2,110 mL [05/16/22 0621]  Pertinent Labs:    Latest Ref Rng & Units 05/16/2022    4:49 AM 05/15/2022    4:06 AM 05/14/2022    7:30 AM  CBC  WBC 4.0 - 10.5 K/uL 6.6  7.7  6.9   Hemoglobin 13.0 - 17.0 g/dL 16.1  09.6  04.5   Hematocrit 39.0 - 52.0 % 37.3  35.8  33.3   Platelets 150 - 400 K/uL 208  229  221        Latest Ref Rng & Units 05/16/2022    4:49 AM 05/15/2022    4:06 AM 05/14/2022    7:30 AM  CMP  Glucose 70 - 99 mg/dL  409  811  914   BUN 8 - 23 mg/dL 11  18  49   Creatinine 0.61 - 1.24 mg/dL 7.82  9.56  2.13   Sodium 135 - 145 mmol/L 133  133  131   Potassium 3.5 - 5.1 mmol/L 3.6  3.3  4.0   Chloride 98 - 111 mmol/L 97  93  100   CO2 22 - 32 mmol/L 26  25  17    Calcium 8.9 - 10.3 mg/dL 8.5  8.3  8.5     Imaging: No results found.  Assessment/Plan:   Principal Problem:   ESRD needing dialysis (HCC) Active Problems:   Hyponatremia   Heart failure with reduced ejection fraction and diastolic dysfunction (HCC)   S/P BKA (below knee amputation) unilateral, right (HCC)   Hypokalemia   Neuropathy   S/P AKA (above knee amputation) unilateral, left (HCC)   Acute febrile illness   Anemia associated with chronic renal failure   Patient Summary: [name] is a 62 y.o. with a pertinent PMH of ESRD on HD TTS, PAD, HFrEF, DM, HTN, right AKA, left BKA, who presented with  pain all over and admitted for HD on HD#3.   ESRD HD TTS Anemia of chronic disease HD 5/11 limited due to hypotension with 1L removed. Confirming new dry weight today. Resume TThS schedule. Electrolytes wnl on lab evaluation this morning.  Medically stable pending SNF placement.  -HD TThS -rechecking K in AM  Concern for systemic infection Remains afebrile with stable vitals.  No clear infectious symptoms or exam findings.  1/2 positive blood cultures for GPR's, this is likely contaminant.  Will hold off on starting antibiotics at this time unless he declares signs of infection -Follow-up blood cultures -Continue to hold antibiotics -Trend CBC, temperature  Distal extremity pain Has a history of neuropathic pain.  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  Phos today is 3.8, calcium is 8.3 (9.0 corrected).  Ulcerations do not seem to be infected at this time.  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  Due to his persistent pain, will also add back on his Lyrica - Resumed home duloxetine 60 mg daily and Lyrica 25 mg daily.  Voltaren gel over hands. Tylenol 650 mg every 6h as needed - Hectorol with HD  DM Last A1c 8.2. -continue sensitive SSI TID -Semglee 10 units qhs   Diet: Carb-Modified VTE: Heparin Code: Full PT/OT recs: SNF for Subacute PT. Family Update: unable to reach [facility], his sister. She is helping with selection of facility   Dispo: Anticipated discharge to Skilled nursing facility pending selection of facility and insurance auth.   [name], D.O.  Internal Medicine Resident, PGY-2 [facility]  Pager: [phone] 6:21 AM, 05/16/2022   **Please contact the on call pager after 5 pm and on weekends at [phone].**

## 2022-05-16 NOTE — Progress Notes (Signed)
[facility] Progress Note   62 y.o. male with PMH including ESRD on dialysis, DTN, DM, b/l BKA, who presented to the ED with body aches and missed HD. Patient is sleepy and is a poor historian, but reports "everything is achy" for several days. Missed dialysis Tuesday because not feeling well.  ED course notable for T 100.10F, Na 126, K+ 3.2, BUN 46, Cr 6.22, WBC 7.1, Hgb 10.9, Plt 189. CXR with no active disease. Blood cultures pending.   Dialysis Orders: Center: [facility]  on TTS . 180NRe 4 hours BFR 400 DFR 700 EDW 65kg 3K 2.5Ca TDC- placed 5.10.23, previously had AVF but developed steal symptoms, per vascular notes likely no other permanent access options No heparin bolus Hectorol IV q HD  Assessment/ Plan:    Fever/body aches: Concerning for systemic infection. Started on vancomycin but held as temp normalized. Blood cultures are still NTD. If positive, patient will need a line holiday and agrees to this plan.  Hopefully we don't have to pull the catheter as he is catheter dependent. Only 1/2 bottles positive for GPR, may be a contaminant as he has no signs of an infection.  ESRD:  TTS, actually last HD was 05/06/22 per outpatient notes. Tolerated HD on Fri with 1.5L net UF with no cramping; HD Sat with 1L net UF. Next HD Tues on TTS schedule. Should be back at EDW now, confusing as the weight on 5/11 was 64.9 and prior day was 71.5kg. Will have nurse zero out the bed today.  Hypertension/volume: BP elevated on arrival, now improved. CXR unremarkable, 6.5kg over EDW if weights here are accurate, does not appear significantly overloaded on exam. On amlodipine and carvedilol outpatient but not sure what he is actually taking, follow BP post HD  Anemia: Hgb 10.9, no ESA indicated at this time.   Metabolic bone disease: Calcium low, phos 3.8. Continue hectorol.   T2DM: on insulin, per admitting team.  Subjective:   C/o pain in the hands; denies f/c/n/v/sob. Asking for a  drink.   Objective:   BP 116/86 (BP Location: Left Arm)   Pulse 84   Temp 98 F (36.7 C) (Oral)   Resp 18   Ht 5\' 8"  (1.727 m)   Wt 64.9 kg Comment: Bed scale  SpO2 100%   BMI 21.76 kg/m   Intake/Output Summary (Last 24 hours) at 05/16/2022 0908 Last data filed at 05/15/2022 2033 Gross per 24 hour  Intake 400 ml  Output 1200 ml  Net -800 ml   Weight change: -6.6 kg  Physical Exam: General: NAD Head: NCAT Neck: Supple  Lungs: CTA b/l, breathing is unlabored. Heart: RRR with normal S1, S2.  Abdomen: SNDNT obese Lower extremities: rt  BKA, lt AKA, no edema  Dialysis Access: TDC with bandage, no visible drainage  Imaging: No results found.  Labs: BMET Recent Labs  Lab 05/13/22 0158 05/14/22 0730 05/15/22 0406 05/16/22 0449  NA 126* 131* 133* 133*  K 3.2* 4.0 3.3* 3.6  CL 96* 100 93* 97*  CO2 20* 17* 25 26  GLUCOSE 257* 269* 171* 230*  BUN 46* 49* 18 11  CREATININE 6.22* 6.00* 3.60* 3.29*  CALCIUM 7.9* 8.5* 8.3* 8.5*  PHOS  --  5.4* 3.8 3.4   CBC Recent Labs  Lab 05/13/22 0158 05/14/22 0730 05/15/22 0406 05/16/22 0449  WBC 7.1 6.9 7.7 6.6  HGB 10.9* 11.5* 12.3* 12.3*  HCT 32.2* 33.3* 35.8* 37.3*  MCV 89.9 89.0 88.0 90.3  PLT  189 221 229 208    Medications:     aspirin EC  81 mg Oral Daily   Chlorhexidine Gluconate Cloth  6 each Topical Q0600   diclofenac Sodium  4 g Topical QID   DULoxetine  60 mg Oral Daily   ezetimibe  10 mg Oral Daily   heparin  5,000 Units Subcutaneous Q8H   insulin aspart  0-6 Units Subcutaneous TID WC   insulin glargine-yfgn  12 Units Subcutaneous QHS   pregabalin  25 mg Oral Daily   rosuvastatin  10 mg Oral Daily      Paulene Floor, MD 05/16/2022, 9:08 AM

## 2022-05-17 ENCOUNTER — Ambulatory Visit: Payer: 59

## 2022-05-17 DIAGNOSIS — R799 Abnormal finding of blood chemistry, unspecified: Secondary | ICD-10-CM | POA: Diagnosis not present

## 2022-05-17 DIAGNOSIS — N186 End stage renal disease: Secondary | ICD-10-CM | POA: Diagnosis not present

## 2022-05-17 DIAGNOSIS — Z89511 Acquired absence of right leg below knee: Secondary | ICD-10-CM | POA: Diagnosis not present

## 2022-05-17 DIAGNOSIS — Z992 Dependence on renal dialysis: Secondary | ICD-10-CM | POA: Diagnosis not present

## 2022-05-17 LAB — GLUCOSE, CAPILLARY
Glucose-Capillary: 107 mg/dL — ABNORMAL HIGH (ref 70–99)
Glucose-Capillary: 196 mg/dL — ABNORMAL HIGH (ref 70–99)
Glucose-Capillary: 163 mg/dL — ABNORMAL HIGH (ref 70–99)
Glucose-Capillary: 241 mg/dL — ABNORMAL HIGH (ref 70–99)
Glucose-Capillary: 218 mg/dL — ABNORMAL HIGH (ref 70–99)

## 2022-05-17 LAB — POTASSIUM: Potassium: 3.6 mmol/L (ref 3.5–5.1)

## 2022-05-17 LAB — CULTURE, BLOOD (ROUTINE X 2)

## 2022-05-17 MED ORDER — ALTEPLASE 2 MG IJ SOLR: 2.0000 mg | Freq: Once | INTRAMUSCULAR | Status: DC | PRN

## 2022-05-17 MED ORDER — PENTAFLUOROPROP-TETRAFLUOROETH EX AERO: 1.0000 | INHALATION_SPRAY | CUTANEOUS | Status: DC | PRN

## 2022-05-17 MED ORDER — LIDOCAINE-PRILOCAINE 2.5-2.5 % EX CREA: 1.0000 | TOPICAL_CREAM | CUTANEOUS | Status: DC | PRN

## 2022-05-17 MED ORDER — LIDOCAINE HCL (PF) 1 % IJ SOLN: 5.0000 mL | INTRAMUSCULAR | Status: DC | PRN

## 2022-05-17 MED ORDER — CARVEDILOL 6.25 MG PO TABS
6.2500 mg | ORAL_TABLET | Freq: Two times a day (BID) | ORAL | Status: DC
  Administered 2022-05-17 – 2022-05-18 (×3): 6.25 mg via ORAL
  Filled 2022-05-17 (×3): qty 1

## 2022-05-17 MED ORDER — INSULIN GLARGINE-YFGN 100 UNIT/ML ~~LOC~~ SOLN
15.0000 [IU] | Freq: Every day | SUBCUTANEOUS | Status: DC
  Filled 2022-05-17: qty 0.15

## 2022-05-17 MED ORDER — HEPARIN SODIUM (PORCINE) 1000 UNIT/ML DIALYSIS: 1000.0000 [IU] | INTRAMUSCULAR | Status: DC | PRN

## 2022-05-17 NOTE — TOC Progression Note (Signed)
Transition of Care [facility]) - Progression Note    Patient Details  Name: [name] MRN: [id number] Date of Birth: April 01, 1960  Transition of Care [facility]) CM/SW Contact  Lulie Hurd A [place], [place] Phone Number: 05/17/2022, 12:50 PM  Clinical Narrative:     CSW contacted [facility] to schedule pt's HD clinic pick up location to [facility]. The office already had it scheduled in the system for this week starting on Tuesday at 11:30am for his 11:45am chair time for his TTS schedule. CSW confirmed information.   TOC will continue to follow.   Expected Discharge Plan: Skilled Nursing Facility Barriers to Discharge: Continued Medical Work up  Expected Discharge Plan and Services   Discharge Planning Services: CM Consult Post Acute Care Choice: Skilled Nursing Facility Living arrangements for the past 2 months: Apartment                                       Social Determinants of Health (SDOH) Interventions SDOH Screenings   Food Insecurity: No Food Insecurity (05/13/2022)  Housing: Low Risk  (05/13/2022)  Transportation Needs: No Transportation Needs (05/13/2022)  Utilities: Not At Risk (05/13/2022)  Depression (PHQ2-9): Low Risk  (02/01/2022)  Tobacco Use: High Risk (05/13/2022)    Readmission Risk Interventions    08/20/2021    3:13 PM  Readmission Risk Prevention Plan  Transportation Screening Complete  PCP or Specialist Appt within 3-5 Days Complete  HRI or Home Care Consult Complete  Social Work Consult for Recovery Care Planning/Counseling Complete  Palliative Care Screening Not Applicable  Medication Review [employer]) Complete

## 2022-05-17 NOTE — Discharge Summary (Incomplete)
Name: [name] MRN: [id number] DOB: [birth year] 62 y.o. PCP: Belva Agee, MD  Date of Admission: 05/13/2022  1:13 AM Date of Discharge: 05/18/22 Attending Physician: Dr.  Ninetta Lights  Discharge Diagnosis: Principal Problem:   ESRD needing dialysis [facility]) Active Problems:   Hyponatremia   Heart failure with reduced ejection fraction and diastolic dysfunction (HCC)   S/P BKA (below knee amputation) unilateral, right (HCC)   Hypokalemia   Neuropathy   S/P AKA (above knee amputation) unilateral, left (HCC)   Acute febrile illness   Anemia associated with chronic renal failure   ESRD (end stage renal disease) (HCC)   Contamination of blood culture    Discharge Medications: Allergies as of 05/18/2022       Reactions   Benicar [olmesartan] Cough        Medication List     STOP taking these medications    amLODipine 10 MG tablet Commonly known as: NORVASC   isosorbide mononitrate 30 MG 24 hr tablet Commonly known as: IMDUR   oxyCODONE 5 MG immediate release tablet Commonly known as: Oxy IR/ROXICODONE   pantoprazole 40 MG tablet Commonly known as: PROTONIX   polyethylene glycol powder 17 GM/SCOOP powder Commonly known as: GLYCOLAX/MIRALAX   senna-docusate 8.6-50 MG tablet Commonly known as: Senexon-S       TAKE these medications    Accu-Chek Aviva Plus test strip Generic drug: glucose blood Check blood sugar 3 times a day   Accu-Chek FastClix Lancets Misc Check blood sugar 3 (three) times daily.   Accu-Chek Guide w/Device Kit Use to check blood sugar 3 (three) times daily.   aspirin EC 81 MG tablet Take 1 tablet (81 mg total) by mouth daily. Swallow whole.   BD Pen Needle Nano U/F 32G X 4 MM Misc Generic drug: Insulin Pen Needle Use to inject insulin 4 (four) times daily.   blood glucose meter kit and supplies Kit Dispense based on patient and insurance preference. Use up to four times daily as directed. (FOR ICD-9 250.00, 250.01).    carvedilol 6.25 MG tablet Commonly known as: COREG Take 1 tablet (6.25 mg total) by mouth 2 (two) times daily with meals for hypertension   Darbepoetin Alfa 100 MCG/0.5ML Sosy injection Commonly known as: ARANESP Inject 0.5 mLs (100 mcg total) into the vein every Wednesday with hemodialysis.   diclofenac Sodium 1 % Gel Commonly known as: VOLTAREN Apply 4 g topically 4 (four) times daily.   DULoxetine 60 MG capsule Commonly known as: CYMBALTA Take 1 capsule (60 mg total) by mouth daily. What changed: medication strength   ezetimibe 10 MG tablet Commonly known as: ZETIA Take 1 tablet (10 mg total) by mouth daily.   fluticasone 50 MCG/ACT nasal spray Commonly known as: FLONASE Place 1 spray into both nostrils daily.   FreeStyle Libre 2 Sensor Misc Place 1 sensor on the skin every 14 days. Use to check glucose continuously   FreeStyle Libre 2 Sensor Misc Use as directed every 14 days   Lantus SoloStar 100 UNIT/ML Solostar Pen Generic drug: insulin glargine Inject 15 Units into the skin at bedtime.   melatonin 3 MG Tabs tablet Commonly known as: KP Melatonin Take 1 tablet (3 mg total) by mouth at bedtime for insomnia.   nicotine 21 mg/24hr patch Commonly known as: NICODERM CQ - dosed in mg/24 hours Place 1 patch (21 mg total) onto the skin daily.   nicotine polacrilex 2 MG gum Commonly known as: NICORETTE Take 1 each (2 mg total) by mouth  as needed for smoking cessation.   NovoLOG FlexPen 100 UNIT/ML FlexPen Generic drug: insulin aspart Inject 5 Units into the skin 3 (three) times daily with meals.   pregabalin 25 MG capsule Commonly known as: LYRICA Take 1 capsule (25 mg total) by mouth daily.   rosuvastatin 10 MG tablet Commonly known as: CRESTOR Take 1 tablet (10 mg total) by mouth daily.   varenicline 0.5 MG tablet Commonly known as: Chantix Take 1 tablet (0.5 mg total) by mouth daily.               Discharge Care Instructions  (From admission,  onward)           Start     Ordered   05/18/22 0000  Discharge wound care:       Comments: Continue routine skin checks for lower extremity stumps and monitoring for sacral wounds   05/18/22 1027            Disposition and follow-up:   Mr.Conn Boeger was discharged from [facility] in Stable condition.  At the hospital follow up visit please address:  1.  Follow-up:  a. Please continue Tuesday/Thursday/Saturday dialysis and continue routine sterile technique for HD cath. No clinical symptoms of bacteremia, sepsis, or line infection on day of d/c    b. Amlodipine and imdur were stopped at discharge because he was becoming hypotensive at HD sessions and was not reporting any anginal symptoms. He might need titration for hypertension with different regimens on dialysis and nondialysis days.   c. Please see if his prosthetics can be refitted in the outpatient setting   d. Please titrate pain regimen for hand pain as needed  2.  Labs / imaging needed at time of follow-up: CBC, Renal function Panel  3.  Pending labs/ test needing follow-up: blood cultures  Follow-up Appointments:  Contact information for after-discharge care     Destination     [facility] Preferred SNF .   Service: Skilled Nursing Contact information: [address] [place] [address] [phone]                     Hospital Course by problem list: [name] is a 62 y.o. person living with a history of ESRD on HD, PAD, HFrEF, diabetes, hypertension, right AKA, left BKA who presented with "pain all over" and admitted on 5/9 for need for dialysis and infectious workup   # ESRD on HD T/Th/Sat History of diabetic nephropathy.  HD via right IJ dialysis catheter in the setting of failed fistula.  Catheter is not sutured as was the case at last office visit in February and is not kept sterile, was covered with plastic bag at time of admission, but no  overt clinical signs of line infection at initial assessment.  Has a history of missing dialysis sessions due to transportation.  Patient reports having missed dialysis on 5/7 with last session being on 5/4. He was urgently dialyzed on 5/10 and then resumed regular TThS schedule afterwards. Please continue this schedule and continue to maintain/educate sterile HD cath techniques with patient.   # Concern for systemic infection Presented with low grade temp 100.1 and body aches/fatigue, but no other clear infectious symptoms or exam findings and negative infectious findings on initial workup. Indwelling HD cath (placed 05/2021) concerning for nidus of infection given lack of sterility at presentation. He was initially started on vancomycin in ED but without clear clinical signs of infection this was  discontinued. The patient subsequently remained afebrile and HD stable and did not declare any infectious symptoms. Blood cultures resulted 1/2 positive for GPRs and other bottle negative x5 days. This was thought to be contaminant without clinical signs of infection. HD cath remained in place.    #Distal extremity pain #PVD s\p R BKA and L AKA Has a history of neuropathic pain.  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  Ulcerations do not seem to be infected.  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  Due to his persistent pain, will also add back on his Lyrica. Of note, he has a history of PAD as well which was partially responsible for one of his lower extremity amputations. Some of this pain could be due to upper extremity claudication. Unfortunately, I do not think there are great treatment options for this. Will try to maximize pain regimen otherwise. Of note, his prosthetics need to be  refitted in the outpatient setting.  # Type 2 diabetes Last A1c was 8.2.  Unsure how much insulin he is actually taking at home, if any. After titration inpatient, he will be discharged with lantus 15 QHS and Novolog 5 TID AC.    # Hypertension # CAD Hypertensive on non dialysis days, especially Sunday and Monday which is his longest gap, but occasionally hypotensive during HD sessions. Home regimen included amlodipine 10, imdur 30 (primarily for angina), coreg 6.25 BID. He was not taking these medications consistently prior to the admission. He did not have any anginal symptoms during the admission. Will d/c imdur and amlodipine at discharge and continue coreg. Will also continue asa 81. He will need further titration of antihypertensives and possibly be on other meds during non-HD days especially.   #Hyperlipidemia Continued Crestor 10, Zetia  Subjective: Assessed during HD. Feels tired today. Hand pain is better. No chest pain, dyspnea, fevers or chills. Discussed resuming regular HD schedule and medication changes including dcing amlodipine and imdur. Amenable to discharge to facility today. He will need prosthetic refitting outpatient  Discharge Vitals:   BP 124/62   Pulse 76   Temp 98.7 F (37.1 C)   Resp 19   Ht 5\' 8"  (1.727 m)   Wt 67.1 kg   SpO2 97%   BMI 22.49 kg/m  Discharge exam: Constitutional: well-appearing, NAD Cardiovascular: regular rate and rhythm, no m/r/g, HD line in right IJ covered in clear dressing, c/d/I without surrounding erythema Pulmonary/Chest: normal work of breathing on room air, lungs clear to auscultation bilaterally Abdominal: soft, non-tender, non-distended MSK: residual limbs with no erythema or skin changes Neurological: Alert and oriented x3 Skin: warm and dry Psych: pleasant   Pertinent Labs, Studies, and Procedures:     Latest Ref Rng & Units 05/18/2022    7:11 AM 05/16/2022    4:49 AM 05/15/2022    4:06 AM  CBC  WBC 4.0 - 10.5 K/uL  7.6  6.6  7.7   Hemoglobin 13.0 - 17.0 g/dL 66.4  40.3  47.4   Hematocrit 39.0 - 52.0 % 30.1  37.3  35.8   Platelets 150 - 400 K/uL 238  208  229        Latest Ref Rng & Units 05/18/2022    7:11 AM 05/17/2022    4:31 AM 05/16/2022    4:49 AM  CMP  Glucose 70 -  99 mg/dL 161   096   BUN 8 - 23 mg/dL 37   11   Creatinine 0.45 - 1.24 mg/dL 4.09   8.11   Sodium 914 - 145 mmol/L 129   133   Potassium 3.5 - 5.1 mmol/L 3.5  3.6  3.6   Chloride 98 - 111 mmol/L 94   97   CO2 22 - 32 mmol/L 21   26   Calcium 8.9 - 10.3 mg/dL 8.0   8.5     DG Chest Portable 1 View  Result Date: 05/13/2022 CLINICAL DATA:  Weakness EXAM: PORTABLE CHEST 1 VIEW COMPARISON:  08/10/2021 FINDINGS: The heart size and mediastinal contours are within normal limits. Both lungs are clear. The visualized skeletal structures are unremarkable. Right IJ central venous catheter tip in the proximal right atrium, unchanged. IMPRESSION: No active disease. Electronically Signed   By: Deatra Robinson M.D.   On: 05/13/2022 02:39     Discharge Instructions: Discharge Instructions     Call MD for:  redness, tenderness, or signs of infection (pain, swelling, redness, odor or green/yellow discharge around incision site)   Complete by: As directed    Call MD for:  temperature >100.4   Complete by: As directed    Diet - low sodium heart healthy   Complete by: As directed    Discharge instructions   Complete by: As directed    1. Please continue going to dialysis Tuesday, Thursday, Saturday 2. Please stop taking amlodipine and imdur, your blood pressure regimen can be changed by your nephrologist if needed. 3. Please continue taking duloxetine and lyrica as prescribed daily for nerve pain. You can also apply voltaren gel to your hands as needed for pain.   Discharge wound care:   Complete by: As directed    Continue routine skin checks for lower extremity stumps and monitoring for sacral wounds   Increase activity slowly   Complete by:  As directed        Discharge Instructions   None     Signed: Lyndle Herrlich, MD 05/18/2022, 10:27 AM   Pager: [phone]

## 2022-05-17 NOTE — Progress Notes (Signed)
[name] Progress Note   Subjective:    Seen and examined patient at bedside. Reports weakness/pain bilateral hands. Noted he's on Voltaren gel and Lyrica. Also noted recent [facility] was contaminated and remains not showing signs for a systemic infection. Awaiting SNF placement. Next HD 5/14 if he is still here.  Objective Vitals:   05/16/22 1248 05/16/22 1520 05/17/22 0404 05/17/22 1103  BP: (!) 152/81 (!) 155/77 (!) 155/84 (!) 172/76  Pulse:  86 79 79  Resp:  18 16 17   Temp:  (!) 97.5 F (36.4 C) 98 F (36.7 C) 97.8 F (36.6 C)  TempSrc:  Oral Oral Oral  SpO2:  98% 99% 98%  Weight:      Height:       Physical Exam General: Awake, alert, afebrile, and on RA Heart:Normal S1 and S2; No murmurs, gallops, or rubs Lungs: Clear anteriorly; No wheezing, rales, and rhonchi Abdomen: Soft and non-tender Extremities: L and R BKAs; No edema BL thighs and stumps Dialysis Access: [facility]   Filed Weights   05/13/22 0116 05/14/22 1450 05/15/22 1610  Weight: 71.5 kg 71.5 kg 64.9 kg    Intake/Output Summary (Last 24 hours) at 05/17/2022 1215 Last data filed at 05/17/2022 1105 Gross per 24 hour  Intake 474 ml  Output 100 ml  Net 374 ml    Additional Objective Labs: Basic Metabolic Panel: Recent Labs  Lab 05/14/22 0730 05/15/22 0406 05/16/22 0449 05/17/22 0431  NA 131* 133* 133*  --   K 4.0 3.3* 3.6 3.6  CL 100 93* 97*  --   CO2 17* 25 26  --   GLUCOSE 269* 171* 230*  --   BUN 49* 18 11  --   CREATININE 6.00* 3.60* 3.29*  --   CALCIUM 8.5* 8.3* 8.5*  --   PHOS 5.4* 3.8 3.4  --    Liver Function Tests: Recent Labs  Lab 05/14/22 0730 05/15/22 0406 05/16/22 0449  ALBUMIN 3.0* 3.1* 3.3*   No results for input(s): "LIPASE", "AMYLASE" in the last 168 hours. CBC: Recent Labs  Lab 05/13/22 0158 05/14/22 0730 05/15/22 0406 05/16/22 0449  WBC 7.1 6.9 7.7 6.6  HGB 10.9* 11.5* 12.3* 12.3*  HCT 32.2* 33.3* 35.8* 37.3*  MCV 89.9 89.0 88.0 90.3  PLT 189 221 229  208   Blood Culture    Component Value Date/Time   SDES  05/13/2022 0315    BLOOD SITE NOT SPECIFIED Performed at [facility], [address], [place], [place] [address]    SPECREQUEST  05/13/2022 0315    BOTTLES DRAWN AEROBIC AND ANAEROBIC Blood Culture adequate volume Performed at [facility], [address] [place], [place] [address]    CULT  05/13/2022 0315    GRAM POSITIVE RODS IDENTIFICATION TO FOLLOW Performed at [facility], [address], [place], [place] [address]    REPTSTATUS PENDING 05/13/2022 2130    Cardiac Enzymes: No results for input(s): "CKTOTAL", "CKMB", "CKMBINDEX", "TROPONINI" in the last 168 hours. CBG: Recent Labs  Lab 05/16/22 1125 05/16/22 1753 05/16/22 2030 05/17/22 0839 05/17/22 1156  GLUCAP 217* 187* 213* 196* 163*   Iron Studies: No results for input(s): "IRON", "TIBC", "TRANSFERRIN", "FERRITIN" in the last 72 hours. Lab Results  Component Value Date   INR 1.4 (H) 08/11/2021   INR 1.2 05/13/2021   INR 1.3 (H) 05/12/2021   Studies/Results: No results found.  Medications:   aspirin EC  81 mg Oral Daily   Chlorhexidine  Gluconate Cloth  6 each Topical Q0600   diclofenac Sodium  4 g Topical QID   DULoxetine  60 mg Oral Daily   ezetimibe  10 mg Oral Daily   heparin  5,000 Units Subcutaneous Q8H   insulin aspart  0-6 Units Subcutaneous TID WC   [START ON 05/18/2022] insulin glargine-yfgn  15 Units Subcutaneous QHS   pregabalin  25 mg Oral Daily   rosuvastatin  10 mg Oral Daily    Dialysis Orders: [employer]  on TTS . 180NRe 4 hours BFR 400 DFR 700 EDW 65kg 3K 2.5Ca TDC- placed 5.10.23, previously had AVF but developed steal symptoms, per vascular notes likely no other permanent access options No heparin bolus Hectorol IV q HD  Assessment/Plan: Fever/body aches: Concerning for systemic infection. Started on vancomycin but held as temp normalized. If positive, patient will  need a line holiday and agrees to this plan.  Hopefully we don't have to pull the catheter as he is catheter dependent. Only 1/2 bottles positive for GPR, declared contaminated per previous notes. He remains not to signs for a systemic infection currently. ESRD:  TTS, actually last HD was 05/06/22 per outpatient notes. Tolerated HD on Fri with 1.5L net UF with no cramping; HD Sat with 1L net UF. Next HD Tues on TTS schedule. Should be back at EDW now, confusing as the weight on 5/11 was 64.9 and prior day was 71.5kg. We need accurate bed weights. Next HD 5/14. Hypertension/volume: BP elevated on arrival, now improved. CXR unremarkable, 6.5kg over EDW if weights here are accurate, does not appear significantly overloaded on exam. On amlodipine and carvedilol outpatient but not sure what he is actually taking, follow BP post HD Anemia: Hgb 12.3, no ESA indicated at this time.  Metabolic bone disease: Calcium low, phos 3.8. Continue hectorol.  T2DM: on insulin, per admitting team. Dispo - remains inpatient and working on SNF placement.   [name] Holmes, NP [name] 05/17/2022,12:15 PM  LOS: 1 day

## 2022-05-17 NOTE — Progress Notes (Addendum)
Case discussed with CSW. Awaiting insurance auth for snf. Contacted [facility] to provide update that insurance auth for snf is pending and pt may/may not d/c today. Will assist as needed.   Olivia Canter Renal Navigator [phone]  Addendum at 4:19 pm: Pt will not d/c today. Contacted [facility] to make clinic aware pt will not resume care tomorrow. Contacted inpt HD unit to request 1st shift tomorrow in case pt can d/c after HD. Renal NP requested 1st shift as well on order.

## 2022-05-17 NOTE — Progress Notes (Signed)
HD#1 Subjective:  Overnight Events: 1x dilaudid for pain overnight  He feels all right today other than persistent pain in his hands.  He says his pain has improved with the Voltaren gel and the Lyrica.  He states that sometimes his arm and hand get numb and start hurting worse when he has a blood Pressure cuff placed during dialysis.  He is amenable to discharge once he is placed in SNF.  Objective:  Vital signs in last 24 hours: Vitals:   05/16/22 0746 05/16/22 1248 05/16/22 1520 05/17/22 0404  BP: 116/86 (!) 152/81 (!) 155/77 (!) 155/84  Pulse: 84  86 79  Resp: 18  18 16   Temp: 98 F (36.7 C)  (!) 97.5 F (36.4 C) 98 F (36.7 C)  TempSrc: Oral  Oral Oral  SpO2: 100%  98% 99%  Weight:      Height:       Supplemental O2: Room Air SpO2: 99 % O2 Flow Rate (L/min): 0 L/min   Physical Exam:  Constitutional: well-appearing, in no acute distress Cardiovascular: regular rate and rhythm, no m/r/g, HD line in right IJ with no erythma Pulmonary/Chest: normal work of breathing on room air, lungs clear to auscultation bilaterally Abdominal: soft, non-tender, non-distended MSK: residual limbs with no erythema or skin changes Neurological: Alert and oriented x3 Skin: warm and dry Psych: pleasant  Filed Weights   05/13/22 0116 05/14/22 1450 05/15/22 1610  Weight: 71.5 kg 71.5 kg 64.9 kg     Intake/Output Summary (Last 24 hours) at 05/17/2022 0618 Last data filed at 05/16/2022 1800 Gross per 24 hour  Intake 714 ml  Output --  Net 714 ml    Net IO Since Admission: -1,396 mL [05/17/22 0618]  Pertinent Labs:    Latest Ref Rng & Units 05/16/2022    4:49 AM 05/15/2022    4:06 AM 05/14/2022    7:30 AM  CBC  WBC 4.0 - 10.5 K/uL 6.6  7.7  6.9   Hemoglobin 13.0 - 17.0 g/dL 16.1  09.6  04.5   Hematocrit 39.0 - 52.0 % 37.3  35.8  33.3   Platelets 150 - 400 K/uL 208  229  221        Latest Ref Rng & Units 05/17/2022    4:31 AM 05/16/2022    4:49 AM 05/15/2022    4:06 AM  CMP   Glucose 70 - 99 mg/dL  409  811   BUN 8 - 23 mg/dL  11  18   Creatinine 9.14 - 1.24 mg/dL  7.82  9.56   Sodium 213 - 145 mmol/L  133  133   Potassium 3.5 - 5.1 mmol/L 3.6  3.6  3.3   Chloride 98 - 111 mmol/L  97  93   CO2 22 - 32 mmol/L  26  25   Calcium 8.9 - 10.3 mg/dL  8.5  8.3     Imaging: No results found.  Assessment/Plan:   Principal Problem:   ESRD needing dialysis (HCC) Active Problems:   Hyponatremia   Heart failure with reduced ejection fraction and diastolic dysfunction (HCC)   S/P BKA (below knee amputation) unilateral, right (HCC)   Hypokalemia   Neuropathy   S/P AKA (above knee amputation) unilateral, left (HCC)   Acute febrile illness   Anemia associated with chronic renal failure   ESRD (end stage renal disease) (HCC)   Patient Summary: [name] is a 62 y.o. with a pertinent PMH of ESRD on HD TTS,  PAD, HFrEF, DM, HTN, right AKA, left BKA, who presented with pain all over and admitted for HD on HD#3.   ESRD HD TTS Anemia of chronic disease Resume TThS schedule.  Medically stable pending SNF placement.  -HD TThS -rechecking K in AM  Concern for systemic infection Remains afebrile with stable vitals.  No clear infectious symptoms or exam findings.  1/2 positive blood cultures for GPR's, this is likely contaminant.  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  No concern for infection of ulcerations on his hands.  Of note, some of this pain could be due to claudication as he noted earlier today.  Unfortunately, I do not think there are great treatment options for this.  Will try to maximize pain regimen otherwise. - Resumed home duloxetine 60 mg daily and Lyrica 25 mg daily.  Voltaren gel over hands. Tylenol 650 mg every 6h as  needed - Hectorol with HD  DM Last A1c 8.2. CBGs elevated yesterday, will increasing long acting insulin to home dose of 15 units -continue sensitive SSI TID -Semglee 15 units qhs   Diet: Carb-Modified VTE: Heparin Code: Full PT/OT recs: SNF for Subacute PT. Family Update: unable to reach [facility], his sister. She is helping with selection of facility   Dispo: Anticipated discharge to Skilled nursing facility pending selection of facility and insurance auth.   Lyndle Herrlich, MD PGY-1 Internal Medicine Resident Please contact the on call pager after 5 pm and on weekends at [phone].

## 2022-05-17 NOTE — Progress Notes (Signed)
Physical Therapy Treatment Patient Details Name: [name] MRN: [id number] DOB: 1960-01-12 Today's Date: 05/17/2022   History of Present Illness 62 year old male adm 5/9 who presented with reported hand pain and pain at both of his stumps. Concern for bacteremia. PMH: ESRD on HD Tuesday/Thursday/Saturday, hypertension, diabetes, HFrEF, right AKA, left BKA.    PT Comments    Pt received in supine, c/o hunger as no breakfast or lunch tray arrived yet (dietary called and tray on the way), but agreeable to therapy session with encouragement, with goal of transfer training. Pt reports decreased strength from his baseline but he was able to long sit in bed with use of bed rails and minA and posterior seated scoot into chair with up to minA for safety and cues for sequencing. Pt performed supine seated scoot toward HOB with minA using overhead rails. Reviewed seated/supine UE/LE exercises with pt, pt able to perform x5 chair push-ups with good hip clearance and should do well with return anterior scoot transfer from chair to bed using arm rests, RN notified for pt safety on technique. Pt continues to benefit from PT services to progress toward functional mobility goals, pt remains a good candidate for short term low intensity post-acute rehab (<3h/day) upon DC and demos good participation and reports low to moderate fatigue 4/10 modified RPE at end of session.    Recommendations for follow up therapy are one component of a multi-disciplinary discharge planning process, led by the attending physician.  Recommendations may be updated based on patient status, additional functional criteria and insurance authorization.  Follow Up Recommendations  Can patient physically be transported by private vehicle: No (could do wheelchair van if manual wheelchair available)    Assistance Recommended at Discharge Frequent or constant Supervision/Assistance  Patient can return home with the following A little help with  walking and/or transfers;A little help with bathing/dressing/bathroom;Help with stairs or ramp for entrance;Assist for transportation   Equipment Recommendations  None recommended by PT (pending progress)    Recommendations for Other Services       Precautions / Restrictions Precautions Precautions: Fall Restrictions Weight Bearing Restrictions: No Other Position/Activity Restrictions: R prosthetic in room, L does not fit him, and he does not wear it.     Mobility  Bed Mobility Overal bed mobility: Needs Assistance Bed Mobility: Supine to Sit, Rolling Rolling: Min guard   Supine to sit: Min assist     General bed mobility comments: Assist to elevate trunk into long sitting with increased time/encouragement from flat bed, pt needing bil bed rails and minA for trunk stability to remain upright.    Transfers Overall transfer level: Needs assistance Equipment used:  (bed pad assist) Transfers: Bed to chair/wheelchair/BSC         Anterior-Posterior transfers: Min assist, From elevated surface   General transfer comment: from EOB>chair via posterior seated scoot, pt needing up to minA for assist via bed pad, chair locked perpendicularly adjacent to bed to ensure no gap between surfaces; Pt with good carryover of instruction for A/P transfer and for safety with return transfer, RN also notified for safe assist to return.    Ambulation/Gait                   Psychologist, counselling mobility:  (no personal wc in room, plan to bring one next therapy session to work on more UE strengthening pending standing tolerance)  Modified Rankin (  Stroke Patients Only)       Balance Overall balance assessment: Needs assistance Sitting-balance support: Feet unsupported, Single extremity supported, Bilateral upper extremity supported Sitting balance-Leahy Scale: Fair Sitting balance - Comments: using BUE support in long  sitting, poor when unsupported by UE                                    Cognition Arousal/Alertness: Awake/alert Behavior During Therapy: Flat affect Overall Cognitive Status: Within Functional Limits for tasks assessed                                 General Comments: Pt states he did not get breakfast or lunch yet, he did have menu on side table but had not called. Pt shown where number is on menu and encouraged to dial out to dietary services when needed for meals if able. PTA assisted him to call to check on food and per staff, lunch is prepared and on the way for him. Dietary services state they will put him on a standard tray to ensure he gets his meals. Pt states he has difficulty seeing the board in his room to tell them his room #.        Exercises Amputee Exercises Quad Sets: AROM, Right, 10 reps, Supine Hip ABduction/ADduction:  (verbal cues for technique, pt defers until after lunch) Hip Flexion/Marching: AROM, Right, 5 reps, Supine Knee Flexion: AROM, Right, 5 reps, Supine Chair Push Up: AROM, Both, 5 reps, Seated (good hip clearance each rep in chair)    General Comments General comments (skin integrity, edema, etc.): Pt with pants donned so unable to assess bottom skin integrity but he does not endorse pain in bottom with scooting, only a little in B hands today. Pillows given for comfort behind his back and under his L arm      Pertinent Vitals/Pain Pain Assessment Pain Assessment: Faces Faces Pain Scale: Hurts a little bit Pain Location: R leg and L hand Pain Descriptors / Indicators: Discomfort Pain Intervention(s): Limited activity within patient's tolerance, Monitored during session, Repositioned    Home Living                          Prior Function            PT Goals (current goals can now be found in the care plan section) Acute Rehab PT Goals Patient Stated Goal: return home PT Goal Formulation: With  patient Time For Goal Achievement: 05/27/22 Progress towards PT goals: Progressing toward goals    Frequency    Min 2X/week      PT Plan Current plan remains appropriate    Co-evaluation              AM-PAC PT "6 Clicks" Mobility   Outcome Measure  Help needed turning from your back to your side while in a flat bed without using bedrails?: A Little Help needed moving from lying on your back to sitting on the side of a flat bed without using bedrails?: A Little Help needed moving to and from a bed to a chair (including a wheelchair)?: A Lot Help needed standing up from a chair using your arms (e.g., wheelchair or bedside chair)?: Total Help needed to walk in hospital room?: Total Help needed climbing 3-5 steps with a railing? :  Total 6 Click Score: 11    End of Session Equipment Utilized During Treatment:  (bed pads) Activity Tolerance: Patient tolerated treatment well;Patient limited by fatigue;Other (comment) (pt has not had a meal yet today and c/o fatigue due to hunger, per dietary no breakfast tray was ordered for him, lunch pending delivery) Patient left: with call bell/phone within reach;in chair;with chair alarm set;Other (comment) (pt requesting ice and soda x2) Nurse Communication: Mobility status;Patient requests pain meds;Other (comment) (safe technique for anterior scoot transfer to return to bed from chair.) PT Visit Diagnosis: Other abnormalities of gait and mobility (R26.89);Muscle weakness (generalized) (M62.81)     Time: 1341-1401 PT Time Calculation (min) (ACUTE ONLY): 20 min  Charges:  $Therapeutic Activity: 8-22 mins                     Mana Morison P., PTA Acute Rehabilitation Services Secure Chat Preferred 9a-5:30pm Office: [phone]    Dorathy Kinsman [facility] 05/17/2022, 2:33 PM

## 2022-05-18 DIAGNOSIS — I2510 Atherosclerotic heart disease of native coronary artery without angina pectoris: Secondary | ICD-10-CM | POA: Diagnosis not present

## 2022-05-18 DIAGNOSIS — N250 Renal osteodystrophy: Secondary | ICD-10-CM | POA: Diagnosis not present

## 2022-05-18 DIAGNOSIS — R059 Cough, unspecified: Secondary | ICD-10-CM | POA: Diagnosis not present

## 2022-05-18 DIAGNOSIS — Z89511 Acquired absence of right leg below knee: Secondary | ICD-10-CM | POA: Diagnosis not present

## 2022-05-18 DIAGNOSIS — G3184 Mild cognitive impairment, so stated: Secondary | ICD-10-CM | POA: Diagnosis not present

## 2022-05-18 DIAGNOSIS — Z794 Long term (current) use of insulin: Secondary | ICD-10-CM | POA: Diagnosis not present

## 2022-05-18 DIAGNOSIS — E8809 Other disorders of plasma-protein metabolism, not elsewhere classified: Secondary | ICD-10-CM | POA: Diagnosis not present

## 2022-05-18 DIAGNOSIS — R531 Weakness: Secondary | ICD-10-CM | POA: Diagnosis not present

## 2022-05-18 DIAGNOSIS — T8249XD Other complication of vascular dialysis catheter, subsequent encounter: Secondary | ICD-10-CM | POA: Diagnosis not present

## 2022-05-18 DIAGNOSIS — E1122 Type 2 diabetes mellitus with diabetic chronic kidney disease: Secondary | ICD-10-CM | POA: Diagnosis not present

## 2022-05-18 DIAGNOSIS — L97509 Non-pressure chronic ulcer of other part of unspecified foot with unspecified severity: Secondary | ICD-10-CM | POA: Diagnosis not present

## 2022-05-18 DIAGNOSIS — R031 Nonspecific low blood-pressure reading: Secondary | ICD-10-CM | POA: Diagnosis not present

## 2022-05-18 DIAGNOSIS — M79641 Pain in right hand: Secondary | ICD-10-CM | POA: Diagnosis not present

## 2022-05-18 DIAGNOSIS — Z23 Encounter for immunization: Secondary | ICD-10-CM | POA: Diagnosis not present

## 2022-05-18 DIAGNOSIS — R799 Abnormal finding of blood chemistry, unspecified: Secondary | ICD-10-CM | POA: Diagnosis not present

## 2022-05-18 DIAGNOSIS — E1159 Type 2 diabetes mellitus with other circulatory complications: Secondary | ICD-10-CM | POA: Diagnosis not present

## 2022-05-18 DIAGNOSIS — I5040 Unspecified combined systolic (congestive) and diastolic (congestive) heart failure: Secondary | ICD-10-CM | POA: Diagnosis not present

## 2022-05-18 DIAGNOSIS — E785 Hyperlipidemia, unspecified: Secondary | ICD-10-CM | POA: Diagnosis not present

## 2022-05-18 DIAGNOSIS — M6281 Muscle weakness (generalized): Secondary | ICD-10-CM | POA: Diagnosis not present

## 2022-05-18 DIAGNOSIS — Z992 Dependence on renal dialysis: Secondary | ICD-10-CM | POA: Diagnosis not present

## 2022-05-18 DIAGNOSIS — Z89612 Acquired absence of left leg above knee: Secondary | ICD-10-CM | POA: Diagnosis not present

## 2022-05-18 DIAGNOSIS — I10 Essential (primary) hypertension: Secondary | ICD-10-CM | POA: Diagnosis not present

## 2022-05-18 DIAGNOSIS — R300 Dysuria: Secondary | ICD-10-CM | POA: Diagnosis not present

## 2022-05-18 DIAGNOSIS — I5042 Chronic combined systolic (congestive) and diastolic (congestive) heart failure: Secondary | ICD-10-CM | POA: Diagnosis not present

## 2022-05-18 DIAGNOSIS — Z7401 Bed confinement status: Secondary | ICD-10-CM | POA: Diagnosis not present

## 2022-05-18 DIAGNOSIS — I739 Peripheral vascular disease, unspecified: Secondary | ICD-10-CM | POA: Diagnosis not present

## 2022-05-18 DIAGNOSIS — F17210 Nicotine dependence, cigarettes, uncomplicated: Secondary | ICD-10-CM | POA: Diagnosis not present

## 2022-05-18 DIAGNOSIS — I252 Old myocardial infarction: Secondary | ICD-10-CM | POA: Diagnosis not present

## 2022-05-18 DIAGNOSIS — R739 Hyperglycemia, unspecified: Secondary | ICD-10-CM | POA: Diagnosis not present

## 2022-05-18 DIAGNOSIS — N186 End stage renal disease: Secondary | ICD-10-CM | POA: Diagnosis not present

## 2022-05-18 DIAGNOSIS — J309 Allergic rhinitis, unspecified: Secondary | ICD-10-CM | POA: Diagnosis not present

## 2022-05-18 DIAGNOSIS — D649 Anemia, unspecified: Secondary | ICD-10-CM | POA: Diagnosis not present

## 2022-05-18 DIAGNOSIS — E871 Hypo-osmolality and hyponatremia: Secondary | ICD-10-CM | POA: Diagnosis not present

## 2022-05-18 DIAGNOSIS — M79642 Pain in left hand: Secondary | ICD-10-CM | POA: Diagnosis not present

## 2022-05-18 DIAGNOSIS — R509 Fever, unspecified: Secondary | ICD-10-CM | POA: Diagnosis not present

## 2022-05-18 DIAGNOSIS — G4700 Insomnia, unspecified: Secondary | ICD-10-CM | POA: Diagnosis not present

## 2022-05-18 DIAGNOSIS — Z95828 Presence of other vascular implants and grafts: Secondary | ICD-10-CM | POA: Diagnosis not present

## 2022-05-18 DIAGNOSIS — E1140 Type 2 diabetes mellitus with diabetic neuropathy, unspecified: Secondary | ICD-10-CM | POA: Diagnosis not present

## 2022-05-18 DIAGNOSIS — E441 Mild protein-calorie malnutrition: Secondary | ICD-10-CM | POA: Diagnosis not present

## 2022-05-18 DIAGNOSIS — J90 Pleural effusion, not elsewhere classified: Secondary | ICD-10-CM | POA: Diagnosis not present

## 2022-05-18 DIAGNOSIS — N2581 Secondary hyperparathyroidism of renal origin: Secondary | ICD-10-CM | POA: Diagnosis not present

## 2022-05-18 DIAGNOSIS — E876 Hypokalemia: Secondary | ICD-10-CM | POA: Diagnosis not present

## 2022-05-18 DIAGNOSIS — J069 Acute upper respiratory infection, unspecified: Secondary | ICD-10-CM | POA: Diagnosis not present

## 2022-05-18 DIAGNOSIS — Z743 Need for continuous supervision: Secondary | ICD-10-CM | POA: Diagnosis not present

## 2022-05-18 DIAGNOSIS — R8290 Unspecified abnormal findings in urine: Secondary | ICD-10-CM | POA: Diagnosis not present

## 2022-05-18 DIAGNOSIS — N390 Urinary tract infection, site not specified: Secondary | ICD-10-CM | POA: Diagnosis not present

## 2022-05-18 DIAGNOSIS — D631 Anemia in chronic kidney disease: Secondary | ICD-10-CM | POA: Diagnosis not present

## 2022-05-18 DIAGNOSIS — I120 Hypertensive chronic kidney disease with stage 5 chronic kidney disease or end stage renal disease: Secondary | ICD-10-CM | POA: Diagnosis not present

## 2022-05-18 DIAGNOSIS — R52 Pain, unspecified: Secondary | ICD-10-CM | POA: Diagnosis not present

## 2022-05-18 DIAGNOSIS — K219 Gastro-esophageal reflux disease without esophagitis: Secondary | ICD-10-CM | POA: Diagnosis not present

## 2022-05-18 LAB — RENAL FUNCTION PANEL
Albumin: 3.2 g/dL — ABNORMAL LOW (ref 3.5–5.0)
Anion gap: 14 (ref 5–15)
BUN: 37 mg/dL — ABNORMAL HIGH (ref 8–23)
CO2: 21 mmol/L — ABNORMAL LOW (ref 22–32)
Calcium: 8 mg/dL — ABNORMAL LOW (ref 8.9–10.3)
Chloride: 94 mmol/L — ABNORMAL LOW (ref 98–111)
Creatinine, Ser: 5.63 mg/dL — ABNORMAL HIGH (ref 0.61–1.24)
GFR, Estimated: 11 mL/min — ABNORMAL LOW (ref >60)
Glucose, Bld: 279 mg/dL — ABNORMAL HIGH (ref 70–99)
Phosphorus: 6.6 mg/dL — ABNORMAL HIGH (ref 2.5–4.6)
Potassium: 3.5 mmol/L (ref 3.5–5.1)
Sodium: 129 mmol/L — ABNORMAL LOW (ref 135–145)

## 2022-05-18 LAB — CULTURE, BLOOD (ROUTINE X 2)

## 2022-05-18 LAB — CBC WITH DIFFERENTIAL/PLATELET
Abs Immature Granulocytes: 0.1 10*3/uL — ABNORMAL HIGH (ref 0.00–0.07)
Basophils Absolute: 0 10*3/uL (ref 0.0–0.1)
Basophils Relative: 0 %
Eosinophils Absolute: 0.3 10*3/uL (ref 0.0–0.5)
Eosinophils Relative: 3 %
HCT: 30.1 % — ABNORMAL LOW (ref 39.0–52.0)
Hemoglobin: 10.6 g/dL — ABNORMAL LOW (ref 13.0–17.0)
Immature Granulocytes: 1 %
Lymphocytes Relative: 28 %
Lymphs Abs: 2.1 10*3/uL (ref 0.7–4.0)
MCH: 30.2 pg (ref 26.0–34.0)
MCHC: 35.2 g/dL (ref 30.0–36.0)
MCV: 85.8 fL (ref 80.0–100.0)
Monocytes Absolute: 0.5 10*3/uL (ref 0.1–1.0)
Monocytes Relative: 7 %
Neutro Abs: 4.6 10*3/uL (ref 1.7–7.7)
Neutrophils Relative %: 61 %
Platelets: 238 10*3/uL (ref 150–400)
RBC: 3.51 MIL/uL — ABNORMAL LOW (ref 4.22–5.81)
RDW: 11.8 % (ref 11.5–15.5)
WBC: 7.6 10*3/uL (ref 4.0–10.5)
nRBC: 0 % (ref 0.0–0.2)

## 2022-05-18 LAB — GLUCOSE, CAPILLARY
Glucose-Capillary: 283 mg/dL — ABNORMAL HIGH (ref 70–99)
Glucose-Capillary: 261 mg/dL — ABNORMAL HIGH (ref 70–99)

## 2022-05-18 MED ORDER — DULOXETINE HCL 60 MG PO CPEP: 60.0000 mg | ORAL_CAPSULE | Freq: Every day | ORAL | 3 refills | Status: DC

## 2022-05-18 MED ORDER — HEPARIN SODIUM (PORCINE) 1000 UNIT/ML IJ SOLN
INTRAMUSCULAR | Status: AC
  Filled 2022-05-18: qty 4

## 2022-05-18 MED ORDER — DICLOFENAC SODIUM 1 % EX GEL: 4.0000 g | Freq: Four times a day (QID) | CUTANEOUS | Status: DC

## 2022-05-18 NOTE — TOC Transition Note (Signed)
Transition of Care [facility]) - CM/SW Discharge Note   Patient Details  Name: [name] MRN: [id number] Date of Birth: 12/03/1960  Transition of Care [facility]) CM/SW Contact:  Dorianne Perret A [place], LCSWA Phone Number: 05/18/2022, 4:21 PM   Clinical Narrative:     Patient will DC to: [facility] Care  Anticipated DC date: 05/18/22  Family notified: Haywood Filler  Transport bySharin Mons  Reference ID [id number]   Auth ID [id number]   Per MD patient ready for DC to [facility] . RN, patient, patient's family, and facility notified of DC. Discharge Summary and FL2 sent to facility. RN to call report prior to discharge  ( [phone], Room 126A). DC packet on chart. Ambulance transport requested for patient.     CSW will sign off for now as social work intervention is no longer needed. Please consult [place] again if new needs arise.    Final next level of care: Skilled Nursing Facility Barriers to Discharge: Barriers Resolved   Patient Goals and CMS Choice CMS Medicare.gov Compare Post Acute Care list provided to:: Patient Choice offered to / list presented to : Patient  Discharge Placement                Patient chooses bed at: [facility] Patient to be transferred to facility by: [facility] Name of family member notified: Haywood Filler Patient and family notified of of transfer: 05/18/22  Discharge Plan and Services Additional resources added to the After Visit Summary for     Discharge Planning Services: CM Consult Post Acute Care Choice: Skilled Nursing Facility                               Social Determinants of Health (SDOH) Interventions SDOH Screenings   Food Insecurity: No Food Insecurity (05/13/2022)  Housing: Low Risk  (05/13/2022)  Transportation Needs: No Transportation Needs (05/13/2022)  Utilities: Not At Risk (05/13/2022)  Depression (PHQ2-9): Low Risk  (02/01/2022)  Tobacco Use: High Risk (05/13/2022)     Readmission Risk Interventions     08/20/2021    3:13 PM  Readmission Risk Prevention Plan  Transportation Screening Complete  PCP or Specialist Appt within 3-5 Days Complete  HRI or Home Care Consult Complete  Social Work Consult for Recovery Care Planning/Counseling Complete  Palliative Care Screening Not Applicable  Medication Review Oceanographer) Complete

## 2022-05-18 NOTE — Progress Notes (Signed)
D/C order noted and insurance approval received per CSW. Contacted [facility] to advise clinic of pt's d/c today to snf and that pt will resume on Thursday. Clinic advised pt will d/c to [facility] snf and be transported by [facility] per CSW.   Olivia Canter Renal Navigator [phone]

## 2022-05-18 NOTE — Progress Notes (Signed)
Called facility spoke with receiving Nurse Amber- gave her full report on patient.

## 2022-05-18 NOTE — Progress Notes (Signed)
[facility] Progress Note   Subjective:    Seen and examined patient on HD. Tolerating UFG 1-1.5L. HD machine was alarming but he was moving around. Lines currently being reversed. No acute complaints and BP now 120/64. Plan for discharge to SNF after HD today.  Objective Vitals:   05/18/22 0909 05/18/22 0931 05/18/22 1000 05/18/22 1035  BP:  (!) 140/70 124/62 (!) 141/75  Pulse: 74  76 72  Resp: 16 12 19 18   Temp:      TempSrc:      SpO2:  100% 97% 100%  Weight:      Height:       Physical Exam General: Awake, alert, afebrile, and on RA Heart:Normal S1 and S2; No murmurs, gallops, or rubs Lungs: Clear anteriorly and laterally; No wheezing, rales, and rhonchi Abdomen: Soft and non-tender Extremities: L and R BKAs; No edema BL thighs and stumps Dialysis Access: [facility]   Filed Weights   05/14/22 1450 05/15/22 1610 05/18/22 0832  Weight: 71.5 kg 64.9 kg 67.1 kg    Intake/Output Summary (Last 24 hours) at 05/18/2022 1042 Last data filed at 05/18/2022 0500 Gross per 24 hour  Intake 237 ml  Output 900 ml  Net -663 ml    Additional Objective Labs: Basic Metabolic Panel: Recent Labs  Lab 05/15/22 0406 05/16/22 0449 05/17/22 0431 05/18/22 0711  NA 133* 133*  --  129*  K 3.3* 3.6 3.6 3.5  CL 93* 97*  --  94*  CO2 25 26  --  21*  GLUCOSE 171* 230*  --  279*  BUN 18 11  --  37*  CREATININE 3.60* 3.29*  --  5.63*  CALCIUM 8.3* 8.5*  --  8.0*  PHOS 3.8 3.4  --  6.6*   Liver Function Tests: Recent Labs  Lab 05/15/22 0406 05/16/22 0449 05/18/22 0711  ALBUMIN 3.1* 3.3* 3.2*   No results for input(s): "LIPASE", "AMYLASE" in the last 168 hours. CBC: Recent Labs  Lab 05/13/22 0158 05/14/22 0730 05/15/22 0406 05/16/22 0449 05/18/22 0711  WBC 7.1 6.9 7.7 6.6 7.6  NEUTROABS  --   --   --   --  4.6  HGB 10.9* 11.5* 12.3* 12.3* 10.6*  HCT 32.2* 33.3* 35.8* 37.3* 30.1*  MCV 89.9 89.0 88.0 90.3 85.8  PLT 189 221 229 208 238   Blood Culture     Component Value Date/Time   SDES  05/13/2022 0315    BLOOD SITE NOT SPECIFIED Performed at [facility], [address] [place], [place] [address]    SPECREQUEST  05/13/2022 0315    BOTTLES DRAWN AEROBIC AND ANAEROBIC Blood Culture adequate volume Performed at [facility], [address] [place], [place] [address]    CULT  05/13/2022 5188    Romie Minus POSITIVE RODS Sammuel Hines FOR ID Performed at [facility], [address] [place], [place] [address]    REPTSTATUS PENDING 05/13/2022 6301    Cardiac Enzymes: No results for input(s): "CKTOTAL", "CKMB", "CKMBINDEX", "TROPONINI" in the last 168 hours. CBG: Recent Labs  Lab 05/16/22 2030 05/17/22 0839 05/17/22 1156 05/17/22 1625 05/17/22 1947  GLUCAP 213* 196* 163* 241* 218*   Iron Studies: No results for input(s): "IRON", "TIBC", "TRANSFERRIN", "FERRITIN" in the last 72 hours. Lab Results  Component Value Date   INR 1.4 (H) 08/11/2021   INR 1.2 05/13/2021   INR 1.3 (H) 05/12/2021   Studies/Results: No results found.  Medications:   aspirin EC  81 mg Oral Daily   carvedilol  6.25 mg Oral BID   Chlorhexidine Gluconate Cloth  6 each Topical Q0600   diclofenac Sodium  4 g Topical QID   DULoxetine  60 mg Oral Daily   ezetimibe  10 mg Oral Daily   heparin  5,000 Units Subcutaneous Q8H   insulin aspart  0-6 Units Subcutaneous TID WC   insulin glargine-yfgn  15 Units Subcutaneous QHS   pregabalin  25 mg Oral Daily   rosuvastatin  10 mg Oral Daily    Dialysis Orders: [facility]  on TTS . 180NRe 4 hours BFR 400 DFR 700 EDW 65kg 3K 2.5Ca TDC- placed 5.10.23, previously had AVF but developed steal symptoms, per vascular notes likely no other permanent access options No heparin bolus Hectorol IV q HD  Assessment/Plan: Fever/body aches: Concerning for systemic infection. Started on vancomycin but held as temp normalized. Hopefully we don't have to pull the  catheter as he is catheter dependent. Only 1/2 bottles positive for GPR, declared contaminated per previous notes. He remains not to signs for a systemic infection currently. ESRD:  TTS. Should be back at EDW now, confusing as the weight on 5/11 was 64.9 and prior day was 71.5kg. We need accurate bed weights. On HD 5/14. Hypertension/volume: BP elevated on arrival, now improved. CXR unremarkable, 6.5kg over EDW if weights here are accurate, euvolemic on exam. On amlodipine and carvedilol outpatient but not sure what he is actually taking, follow BP post HD Anemia: Hgb 12.3, no ESA indicated at this time.  Metabolic bone disease: Calcium low, phos 3.8. Continue hectorol.  T2DM: on insulin, per admitting team. Dispo - remains inpatient and working on SNF placement.   Salome Holmes, NP [facility] 05/18/2022,10:42 AM  LOS: 2 days

## 2022-05-18 NOTE — Progress Notes (Signed)
   05/18/22 1206  Vitals  Temp 98.7 F (37.1 C)  BP (!) 145/65  Pulse Rate 74  Resp 20  Oxygen Therapy  SpO2 99 %  O2 Device Room Air  Patient Activity (if Appropriate) In bed  Pulse Oximetry Type Continuous  Post Treatment  Dialyzer Clearance Clear  Duration of HD Treatment -hour(s) 3.5 hour(s)  Hemodialysis Intake (mL) 0 mL  Liters Processed 70.4  Fluid Removed (mL) 1000 mL  Tolerated HD Treatment Yes  Post-Hemodialysis Comments Pt. tolerated procedure well withou difficulties   Received patient in bed to unit.  Alert and oriented.  Informed consent signed and in chart.   TX duration:3.5 hours  Patient tolerated well.  Transported back to the room  Alert, without acute distress.  Hand-off given to patient's nurse.   Access used: Yes Access issues: No  Total UF removed: 1000 Medication(s) given: Pre Heparin Locked 1.6cc both  HD ports Post HD VS: See Above Grid Post HD weight: 67.1 kg   [facility]

## 2022-05-19 NOTE — Plan of Care (Signed)
[facility] Patient Discharge Orders- [facility] CLINIC: [facility]/Dc to SNF  Patient's name: Galen Trabue Admit/DC Dates: 05/13/2022 - 05/18/2022  Discharge Diagnoses: Concern for systemic infection -  Blood Cx 1/2 bottles (+) for GPC but declared contaminated, didn't show evidence of infection moving forward so ABXs were stopped  Aranesp: Given: No   Last Hgb: 10.6 PRBC's Given: No ESA dose for discharge: Start Mircera 30 mcg IV q 2 weeks  IV Iron dose at discharge: Continue weekly IV Fe  Heparin change: No  EDW Change: No-weights here were not accurate given he was in bed. Notify medical team if weight discrepancy is noted.  Bath Change: No  Access intervention/Change: No  Hectorol change: No  Discharge Labs: Calcium 8.0 Phosphorus 6.6 Albumin 3.2 K+ 3.5  IV Antibiotics: No Details: ABX were stopped here as he was NOT showing signs for a systemic infection  On Coumadin?: No  OTHER/APPTS/LAB ORDERS:    D/C Meds to be reconciled by nurse after every discharge.  Completed By: Salome Holmes, NP-C 05/19/2022, 4:38 PM  [facility] Pager: [phone]  Reviewed by: MD:______ RN_______

## 2022-05-20 DIAGNOSIS — Z794 Long term (current) use of insulin: Secondary | ICD-10-CM | POA: Diagnosis not present

## 2022-05-20 DIAGNOSIS — D631 Anemia in chronic kidney disease: Secondary | ICD-10-CM | POA: Diagnosis not present

## 2022-05-20 DIAGNOSIS — I10 Essential (primary) hypertension: Secondary | ICD-10-CM | POA: Diagnosis not present

## 2022-05-20 DIAGNOSIS — Z992 Dependence on renal dialysis: Secondary | ICD-10-CM | POA: Diagnosis not present

## 2022-05-20 DIAGNOSIS — N2581 Secondary hyperparathyroidism of renal origin: Secondary | ICD-10-CM | POA: Diagnosis not present

## 2022-05-20 DIAGNOSIS — G4700 Insomnia, unspecified: Secondary | ICD-10-CM | POA: Diagnosis not present

## 2022-05-20 DIAGNOSIS — E785 Hyperlipidemia, unspecified: Secondary | ICD-10-CM | POA: Diagnosis not present

## 2022-05-20 DIAGNOSIS — T8249XD Other complication of vascular dialysis catheter, subsequent encounter: Secondary | ICD-10-CM | POA: Diagnosis not present

## 2022-05-20 DIAGNOSIS — N186 End stage renal disease: Secondary | ICD-10-CM | POA: Diagnosis not present

## 2022-05-20 DIAGNOSIS — E1159 Type 2 diabetes mellitus with other circulatory complications: Secondary | ICD-10-CM | POA: Diagnosis not present

## 2022-05-20 DIAGNOSIS — R531 Weakness: Secondary | ICD-10-CM | POA: Diagnosis not present

## 2022-05-21 DIAGNOSIS — Z794 Long term (current) use of insulin: Secondary | ICD-10-CM | POA: Diagnosis not present

## 2022-05-21 DIAGNOSIS — E785 Hyperlipidemia, unspecified: Secondary | ICD-10-CM | POA: Diagnosis not present

## 2022-05-21 DIAGNOSIS — I10 Essential (primary) hypertension: Secondary | ICD-10-CM | POA: Diagnosis not present

## 2022-05-21 DIAGNOSIS — E1159 Type 2 diabetes mellitus with other circulatory complications: Secondary | ICD-10-CM | POA: Diagnosis not present

## 2022-05-21 DIAGNOSIS — G4700 Insomnia, unspecified: Secondary | ICD-10-CM | POA: Diagnosis not present

## 2022-05-21 DIAGNOSIS — R531 Weakness: Secondary | ICD-10-CM | POA: Diagnosis not present

## 2022-05-21 DIAGNOSIS — N186 End stage renal disease: Secondary | ICD-10-CM | POA: Diagnosis not present

## 2022-05-21 DIAGNOSIS — Z992 Dependence on renal dialysis: Secondary | ICD-10-CM | POA: Diagnosis not present

## 2022-05-22 DIAGNOSIS — Z992 Dependence on renal dialysis: Secondary | ICD-10-CM | POA: Diagnosis not present

## 2022-05-22 DIAGNOSIS — N186 End stage renal disease: Secondary | ICD-10-CM | POA: Diagnosis not present

## 2022-05-22 DIAGNOSIS — D631 Anemia in chronic kidney disease: Secondary | ICD-10-CM | POA: Diagnosis not present

## 2022-05-22 DIAGNOSIS — T8249XD Other complication of vascular dialysis catheter, subsequent encounter: Secondary | ICD-10-CM | POA: Diagnosis not present

## 2022-05-22 DIAGNOSIS — N2581 Secondary hyperparathyroidism of renal origin: Secondary | ICD-10-CM | POA: Diagnosis not present

## 2022-05-24 DIAGNOSIS — I2510 Atherosclerotic heart disease of native coronary artery without angina pectoris: Secondary | ICD-10-CM | POA: Diagnosis not present

## 2022-05-24 DIAGNOSIS — I5040 Unspecified combined systolic (congestive) and diastolic (congestive) heart failure: Secondary | ICD-10-CM | POA: Diagnosis not present

## 2022-05-24 DIAGNOSIS — I739 Peripheral vascular disease, unspecified: Secondary | ICD-10-CM | POA: Diagnosis not present

## 2022-05-24 DIAGNOSIS — I10 Essential (primary) hypertension: Secondary | ICD-10-CM | POA: Diagnosis not present

## 2022-05-25 DIAGNOSIS — N2581 Secondary hyperparathyroidism of renal origin: Secondary | ICD-10-CM | POA: Diagnosis not present

## 2022-05-25 DIAGNOSIS — T8249XD Other complication of vascular dialysis catheter, subsequent encounter: Secondary | ICD-10-CM | POA: Diagnosis not present

## 2022-05-25 DIAGNOSIS — N186 End stage renal disease: Secondary | ICD-10-CM | POA: Diagnosis not present

## 2022-05-25 DIAGNOSIS — D631 Anemia in chronic kidney disease: Secondary | ICD-10-CM | POA: Diagnosis not present

## 2022-05-25 DIAGNOSIS — Z992 Dependence on renal dialysis: Secondary | ICD-10-CM | POA: Diagnosis not present

## 2022-05-26 DIAGNOSIS — I739 Peripheral vascular disease, unspecified: Secondary | ICD-10-CM | POA: Diagnosis not present

## 2022-05-26 DIAGNOSIS — R531 Weakness: Secondary | ICD-10-CM | POA: Diagnosis not present

## 2022-05-26 DIAGNOSIS — M79641 Pain in right hand: Secondary | ICD-10-CM | POA: Diagnosis not present

## 2022-05-27 DIAGNOSIS — Z992 Dependence on renal dialysis: Secondary | ICD-10-CM | POA: Diagnosis not present

## 2022-05-27 DIAGNOSIS — D631 Anemia in chronic kidney disease: Secondary | ICD-10-CM | POA: Diagnosis not present

## 2022-05-27 DIAGNOSIS — N186 End stage renal disease: Secondary | ICD-10-CM | POA: Diagnosis not present

## 2022-05-27 DIAGNOSIS — T8249XD Other complication of vascular dialysis catheter, subsequent encounter: Secondary | ICD-10-CM | POA: Diagnosis not present

## 2022-05-27 DIAGNOSIS — N2581 Secondary hyperparathyroidism of renal origin: Secondary | ICD-10-CM | POA: Diagnosis not present

## 2022-05-28 DIAGNOSIS — R031 Nonspecific low blood-pressure reading: Secondary | ICD-10-CM | POA: Diagnosis not present

## 2022-05-29 DIAGNOSIS — Z992 Dependence on renal dialysis: Secondary | ICD-10-CM | POA: Diagnosis not present

## 2022-05-29 DIAGNOSIS — T8249XD Other complication of vascular dialysis catheter, subsequent encounter: Secondary | ICD-10-CM | POA: Diagnosis not present

## 2022-05-29 DIAGNOSIS — N186 End stage renal disease: Secondary | ICD-10-CM | POA: Diagnosis not present

## 2022-05-29 DIAGNOSIS — N2581 Secondary hyperparathyroidism of renal origin: Secondary | ICD-10-CM | POA: Diagnosis not present

## 2022-05-29 DIAGNOSIS — D631 Anemia in chronic kidney disease: Secondary | ICD-10-CM | POA: Diagnosis not present

## 2022-05-31 DIAGNOSIS — J069 Acute upper respiratory infection, unspecified: Secondary | ICD-10-CM | POA: Diagnosis not present

## 2022-05-31 DIAGNOSIS — M79641 Pain in right hand: Secondary | ICD-10-CM | POA: Diagnosis not present

## 2022-06-01 DIAGNOSIS — D631 Anemia in chronic kidney disease: Secondary | ICD-10-CM | POA: Diagnosis not present

## 2022-06-01 DIAGNOSIS — T8249XD Other complication of vascular dialysis catheter, subsequent encounter: Secondary | ICD-10-CM | POA: Diagnosis not present

## 2022-06-01 DIAGNOSIS — N2581 Secondary hyperparathyroidism of renal origin: Secondary | ICD-10-CM | POA: Diagnosis not present

## 2022-06-01 DIAGNOSIS — Z992 Dependence on renal dialysis: Secondary | ICD-10-CM | POA: Diagnosis not present

## 2022-06-01 DIAGNOSIS — N186 End stage renal disease: Secondary | ICD-10-CM | POA: Diagnosis not present

## 2022-06-02 DIAGNOSIS — R531 Weakness: Secondary | ICD-10-CM | POA: Diagnosis not present

## 2022-06-02 DIAGNOSIS — E871 Hypo-osmolality and hyponatremia: Secondary | ICD-10-CM | POA: Diagnosis not present

## 2022-06-02 DIAGNOSIS — N186 End stage renal disease: Secondary | ICD-10-CM | POA: Diagnosis not present

## 2022-06-02 DIAGNOSIS — D649 Anemia, unspecified: Secondary | ICD-10-CM | POA: Diagnosis not present

## 2022-06-02 DIAGNOSIS — Z992 Dependence on renal dialysis: Secondary | ICD-10-CM | POA: Diagnosis not present

## 2022-06-04 DIAGNOSIS — E1122 Type 2 diabetes mellitus with diabetic chronic kidney disease: Secondary | ICD-10-CM | POA: Diagnosis not present

## 2022-06-04 DIAGNOSIS — Z992 Dependence on renal dialysis: Secondary | ICD-10-CM | POA: Diagnosis not present

## 2022-06-04 DIAGNOSIS — N186 End stage renal disease: Secondary | ICD-10-CM | POA: Diagnosis not present

## 2022-06-05 DIAGNOSIS — R531 Weakness: Secondary | ICD-10-CM | POA: Diagnosis not present

## 2022-06-05 DIAGNOSIS — R059 Cough, unspecified: Secondary | ICD-10-CM | POA: Diagnosis not present

## 2022-06-05 DIAGNOSIS — Z992 Dependence on renal dialysis: Secondary | ICD-10-CM | POA: Diagnosis not present

## 2022-06-05 DIAGNOSIS — N186 End stage renal disease: Secondary | ICD-10-CM | POA: Diagnosis not present

## 2022-06-07 ENCOUNTER — Other Ambulatory Visit: Payer: Self-pay | Admitting: *Deleted

## 2022-06-07 ENCOUNTER — Encounter: Payer: Self-pay | Admitting: Student

## 2022-06-07 DIAGNOSIS — J90 Pleural effusion, not elsewhere classified: Secondary | ICD-10-CM | POA: Diagnosis not present

## 2022-06-07 DIAGNOSIS — Z992 Dependence on renal dialysis: Secondary | ICD-10-CM | POA: Diagnosis not present

## 2022-06-07 DIAGNOSIS — R531 Weakness: Secondary | ICD-10-CM | POA: Diagnosis not present

## 2022-06-07 DIAGNOSIS — D649 Anemia, unspecified: Secondary | ICD-10-CM | POA: Diagnosis not present

## 2022-06-07 DIAGNOSIS — N186 End stage renal disease: Secondary | ICD-10-CM | POA: Diagnosis not present

## 2022-06-07 NOTE — Patient Outreach (Signed)
Per [facility] [name] resides in [facility] skilled nursing facility. Screening for potential [facility] care coordination services as benefit of health plan and Primary Care Provider.   Telephone call made to [name] [phone]. Patient identifiers confirmed. [name] reports he is not aware of a discharge date yet. Discussed [facility] care coordination services. [name] reports he lives alone. States he goes to HD on Tuesday, Thursdays, and Saturdays. States he has concerns with [facility] because they do not help him out of his house. States he has missed HD in the past because [facility] will not wait a few minutes longer for his aunt to help him to the [facility] bus. Discussed him already being ready and waiting before [facility] comes instead of getting ready right at time of [facility]'s arrival.  [name] states he will ask his aunt to come before [facility] arrives. [name] does not have a ramp.   [name] endorses having Medicaid but states he does not have a caseworker that he knows of. States he applied for personal care services thru Medicaid but has not heard back.    [name] reports his children take turns in assisting him at home.   [name] agreeable to writer making [facility] Care Management referral for [facility] RN and [facility] SW.  Will continue to follow.    [name], MSN, RN,BSN [facility] Post Acute Care Coordinator [phone] (Direct dial)

## 2022-06-08 DIAGNOSIS — R52 Pain, unspecified: Secondary | ICD-10-CM | POA: Diagnosis not present

## 2022-06-08 DIAGNOSIS — M79641 Pain in right hand: Secondary | ICD-10-CM | POA: Diagnosis not present

## 2022-06-08 DIAGNOSIS — T8249XD Other complication of vascular dialysis catheter, subsequent encounter: Secondary | ICD-10-CM | POA: Diagnosis not present

## 2022-06-08 DIAGNOSIS — N2581 Secondary hyperparathyroidism of renal origin: Secondary | ICD-10-CM | POA: Diagnosis not present

## 2022-06-08 DIAGNOSIS — Z992 Dependence on renal dialysis: Secondary | ICD-10-CM | POA: Diagnosis not present

## 2022-06-08 DIAGNOSIS — N186 End stage renal disease: Secondary | ICD-10-CM | POA: Diagnosis not present

## 2022-06-08 DIAGNOSIS — I739 Peripheral vascular disease, unspecified: Secondary | ICD-10-CM | POA: Diagnosis not present

## 2022-06-08 DIAGNOSIS — M79642 Pain in left hand: Secondary | ICD-10-CM | POA: Diagnosis not present

## 2022-06-09 DIAGNOSIS — I5042 Chronic combined systolic (congestive) and diastolic (congestive) heart failure: Secondary | ICD-10-CM | POA: Diagnosis not present

## 2022-06-09 DIAGNOSIS — M79642 Pain in left hand: Secondary | ICD-10-CM | POA: Diagnosis not present

## 2022-06-09 DIAGNOSIS — M79641 Pain in right hand: Secondary | ICD-10-CM | POA: Diagnosis not present

## 2022-06-10 DIAGNOSIS — N2581 Secondary hyperparathyroidism of renal origin: Secondary | ICD-10-CM | POA: Diagnosis not present

## 2022-06-10 DIAGNOSIS — T8249XD Other complication of vascular dialysis catheter, subsequent encounter: Secondary | ICD-10-CM | POA: Diagnosis not present

## 2022-06-10 DIAGNOSIS — Z992 Dependence on renal dialysis: Secondary | ICD-10-CM | POA: Diagnosis not present

## 2022-06-10 DIAGNOSIS — M79642 Pain in left hand: Secondary | ICD-10-CM | POA: Diagnosis not present

## 2022-06-10 DIAGNOSIS — M79641 Pain in right hand: Secondary | ICD-10-CM | POA: Diagnosis not present

## 2022-06-10 DIAGNOSIS — I739 Peripheral vascular disease, unspecified: Secondary | ICD-10-CM | POA: Diagnosis not present

## 2022-06-10 DIAGNOSIS — N186 End stage renal disease: Secondary | ICD-10-CM | POA: Diagnosis not present

## 2022-06-10 DIAGNOSIS — R52 Pain, unspecified: Secondary | ICD-10-CM | POA: Diagnosis not present

## 2022-06-10 LAB — AEROBIC BACTERIA, ID BY SEQ.

## 2022-06-10 LAB — ORGANISM ID BY SEQUENCING

## 2022-06-11 DIAGNOSIS — R300 Dysuria: Secondary | ICD-10-CM | POA: Diagnosis not present

## 2022-06-11 DIAGNOSIS — R531 Weakness: Secondary | ICD-10-CM | POA: Diagnosis not present

## 2022-06-11 DIAGNOSIS — R8290 Unspecified abnormal findings in urine: Secondary | ICD-10-CM | POA: Diagnosis not present

## 2022-06-11 LAB — MISC LABCORP TEST (SEND OUT): Labcorp test code: 182261

## 2022-06-12 DIAGNOSIS — N186 End stage renal disease: Secondary | ICD-10-CM | POA: Diagnosis not present

## 2022-06-12 DIAGNOSIS — T8249XD Other complication of vascular dialysis catheter, subsequent encounter: Secondary | ICD-10-CM | POA: Diagnosis not present

## 2022-06-12 DIAGNOSIS — R52 Pain, unspecified: Secondary | ICD-10-CM | POA: Diagnosis not present

## 2022-06-12 DIAGNOSIS — Z992 Dependence on renal dialysis: Secondary | ICD-10-CM | POA: Diagnosis not present

## 2022-06-12 DIAGNOSIS — N2581 Secondary hyperparathyroidism of renal origin: Secondary | ICD-10-CM | POA: Diagnosis not present

## 2022-06-14 LAB — SUSCEPTIBILITY, GRAM POS RODS

## 2022-06-15 ENCOUNTER — Other Ambulatory Visit: Payer: Self-pay | Admitting: *Deleted

## 2022-06-15 ENCOUNTER — Other Ambulatory Visit (HOSPITAL_COMMUNITY): Payer: Self-pay

## 2022-06-15 DIAGNOSIS — I5040 Unspecified combined systolic (congestive) and diastolic (congestive) heart failure: Secondary | ICD-10-CM

## 2022-06-15 DIAGNOSIS — R531 Weakness: Secondary | ICD-10-CM | POA: Diagnosis not present

## 2022-06-15 DIAGNOSIS — N186 End stage renal disease: Secondary | ICD-10-CM | POA: Diagnosis not present

## 2022-06-15 DIAGNOSIS — Z9181 History of falling: Secondary | ICD-10-CM | POA: Diagnosis not present

## 2022-06-15 DIAGNOSIS — Z992 Dependence on renal dialysis: Secondary | ICD-10-CM | POA: Diagnosis not present

## 2022-06-15 LAB — CULTURE, BLOOD (ROUTINE X 2)

## 2022-06-15 MED ORDER — PREGABALIN 25 MG PO CAPS
25.0000 mg | ORAL_CAPSULE | Freq: Every day | ORAL | 0 refills | Status: DC
  Filled 2022-06-15: qty 30, 30d supply, fill #0

## 2022-06-15 MED ORDER — TRAMADOL HCL 50 MG PO TABS
50.0000 mg | ORAL_TABLET | Freq: Two times a day (BID) | ORAL | 0 refills | Status: DC | PRN
  Filled 2022-06-15: qty 14, 7d supply, fill #0
  Filled 2022-06-28: qty 14, 7d supply, fill #1

## 2022-06-15 NOTE — Patient Outreach (Signed)
Per [facility] Mr. Lizarraga discharged from [facility] skilled nursing facility on 06/14/22.   Mr. Andringa previously agreed to [facility] Care Management services.Please see writer's notes from 06/07/22. Confirmed with [facility] discharge planner, [facility] arranged for PT, OT, nurse, and aide.   Will refer to [facility] RN for care coordination and [facility] SW for assist to with navigating Medicaid follow up.  [name], MSN, RN,BSN [facility] Post Acute Care Coordinator [phone] (Direct dial)

## 2022-06-16 ENCOUNTER — Telehealth: Payer: Self-pay | Admitting: *Deleted

## 2022-06-16 LAB — SUSCEPTIBILITY, GRAM POS RODS

## 2022-06-16 NOTE — Progress Notes (Signed)
  Care Coordination  Outreach Note  06/16/2022 Name: Ethel Veronica MRN: [id number] DOB: [birth year]   Care Coordination Outreach Attempts: An unsuccessful telephone outreach was attempted today to offer the patient information about available care coordination services.  Follow Up Plan:  Additional outreach attempts will be made to offer the patient care coordination information and services.   Encounter Outcome:  No Answer  Christie Nottingham  Care Coordination Care Guide  Direct Dial: [phone]

## 2022-06-18 DIAGNOSIS — E119 Type 2 diabetes mellitus without complications: Secondary | ICD-10-CM | POA: Diagnosis not present

## 2022-06-18 DIAGNOSIS — E1140 Type 2 diabetes mellitus with diabetic neuropathy, unspecified: Secondary | ICD-10-CM | POA: Diagnosis not present

## 2022-06-18 DIAGNOSIS — I739 Peripheral vascular disease, unspecified: Secondary | ICD-10-CM | POA: Diagnosis not present

## 2022-06-18 DIAGNOSIS — E876 Hypokalemia: Secondary | ICD-10-CM | POA: Diagnosis not present

## 2022-06-18 DIAGNOSIS — Z89612 Acquired absence of left leg above knee: Secondary | ICD-10-CM | POA: Diagnosis not present

## 2022-06-21 DIAGNOSIS — E876 Hypokalemia: Secondary | ICD-10-CM | POA: Diagnosis not present

## 2022-06-21 DIAGNOSIS — I739 Peripheral vascular disease, unspecified: Secondary | ICD-10-CM | POA: Diagnosis not present

## 2022-06-21 DIAGNOSIS — E119 Type 2 diabetes mellitus without complications: Secondary | ICD-10-CM | POA: Diagnosis not present

## 2022-06-21 DIAGNOSIS — Z89612 Acquired absence of left leg above knee: Secondary | ICD-10-CM | POA: Diagnosis not present

## 2022-06-21 DIAGNOSIS — E1140 Type 2 diabetes mellitus with diabetic neuropathy, unspecified: Secondary | ICD-10-CM | POA: Diagnosis not present

## 2022-06-21 NOTE — Progress Notes (Signed)
  Care Coordination  Outreach Note  06/21/2022 Name: [name] MRN: [id number] DOB: [birth year]   Care Coordination Outreach Attempts: A second unsuccessful outreach was attempted today to offer the patient with information about available care coordination services.  Follow Up Plan:  Additional outreach attempts will be made to offer the patient care coordination information and services.   Encounter Outcome:  No Answer Gwenevere Ghazi  Care Coordination Care Guide  Direct Dial: [phone]

## 2022-06-23 ENCOUNTER — Emergency Department (HOSPITAL_COMMUNITY): Payer: 59

## 2022-06-23 ENCOUNTER — Emergency Department (HOSPITAL_COMMUNITY)
Admission: EM | Admit: 2022-06-23 | Discharge: 2022-06-24 | Disposition: A | Discharge status: Home / Self Care | Payer: 59 | Attending: Emergency Medicine | Admitting: Emergency Medicine

## 2022-06-23 ENCOUNTER — Other Ambulatory Visit: Payer: Self-pay

## 2022-06-23 ENCOUNTER — Encounter (HOSPITAL_COMMUNITY): Payer: Self-pay | Admitting: Emergency Medicine

## 2022-06-23 DIAGNOSIS — Z992 Dependence on renal dialysis: Secondary | ICD-10-CM | POA: Insufficient documentation

## 2022-06-23 DIAGNOSIS — Z7982 Long term (current) use of aspirin: Secondary | ICD-10-CM | POA: Insufficient documentation

## 2022-06-23 DIAGNOSIS — I120 Hypertensive chronic kidney disease with stage 5 chronic kidney disease or end stage renal disease: Secondary | ICD-10-CM | POA: Diagnosis not present

## 2022-06-23 DIAGNOSIS — M25512 Pain in left shoulder: Secondary | ICD-10-CM | POA: Diagnosis not present

## 2022-06-23 DIAGNOSIS — W19XXXA Unspecified fall, initial encounter: Secondary | ICD-10-CM | POA: Insufficient documentation

## 2022-06-23 DIAGNOSIS — E1122 Type 2 diabetes mellitus with diabetic chronic kidney disease: Secondary | ICD-10-CM | POA: Insufficient documentation

## 2022-06-23 DIAGNOSIS — R6889 Other general symptoms and signs: Secondary | ICD-10-CM | POA: Diagnosis not present

## 2022-06-23 DIAGNOSIS — Z043 Encounter for examination and observation following other accident: Secondary | ICD-10-CM | POA: Diagnosis not present

## 2022-06-23 DIAGNOSIS — Z79899 Other long term (current) drug therapy: Secondary | ICD-10-CM | POA: Insufficient documentation

## 2022-06-23 DIAGNOSIS — Z743 Need for continuous supervision: Secondary | ICD-10-CM | POA: Diagnosis not present

## 2022-06-23 DIAGNOSIS — M79641 Pain in right hand: Secondary | ICD-10-CM | POA: Insufficient documentation

## 2022-06-23 DIAGNOSIS — Z7984 Long term (current) use of oral hypoglycemic drugs: Secondary | ICD-10-CM | POA: Diagnosis not present

## 2022-06-23 DIAGNOSIS — Z794 Long term (current) use of insulin: Secondary | ICD-10-CM | POA: Diagnosis not present

## 2022-06-23 DIAGNOSIS — R739 Hyperglycemia, unspecified: Secondary | ICD-10-CM | POA: Diagnosis not present

## 2022-06-23 DIAGNOSIS — M79643 Pain in unspecified hand: Secondary | ICD-10-CM | POA: Diagnosis not present

## 2022-06-23 DIAGNOSIS — N186 End stage renal disease: Secondary | ICD-10-CM | POA: Insufficient documentation

## 2022-06-23 DIAGNOSIS — M79642 Pain in left hand: Secondary | ICD-10-CM | POA: Diagnosis not present

## 2022-06-23 DIAGNOSIS — M25519 Pain in unspecified shoulder: Secondary | ICD-10-CM | POA: Diagnosis not present

## 2022-06-23 MED ORDER — ACETAMINOPHEN 325 MG PO TABS
650.0000 mg | ORAL_TABLET | Freq: Once | ORAL | Status: AC
  Administered 2022-06-23: 650 mg via ORAL
  Filled 2022-06-23: qty 2

## 2022-06-23 NOTE — ED Triage Notes (Signed)
Pt BIB EMS from home pt states that he was reaching down to pick something up and his wheelchair got caught and he fell forward onto his hands. C/o BIL hand pain, states L hand is worse and L shoulder pain. Denies any other injury, denies LOC. Hx of HTN and DM, states that he has not taken his medication on 3 weeks. EMS VS:  198/102 CBG 308

## 2022-06-23 NOTE — ED Provider Notes (Signed)
EMERGENCY DEPARTMENT AT [facility] ([facility]) Provider Note   CSN: [id number] Arrival date & time: 06/23/22  2250     History {Add pertinent medical, surgical, social history, OB history to HPI:1} Chief Complaint  Patient presents with   [name] is a 62 y.o. male.  HPI     This is a 62 year old male who presents following a fall.  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  Patient states that his wheelchair got stuck going out the door and he flipped forward.  He landed on bilateral hands.  He is complaining of bilateral hand pain and left shoulder pain.  Did not hit his head or lose consciousness.  Home Medications Prior to Admission medications   Medication Sig Start Date End Date Taking? Authorizing Provider  Accu-Chek FastClix Lancets MISC Check blood sugar 3 (three) times daily. 03/04/22   Belva Agee, MD  aspirin EC 81 MG tablet Take 1 tablet (81 mg total) by mouth daily. Swallow whole. Patient not taking: Reported on 07/02/2021 06/03/21   Medina-Vargas, Monina C, NP  blood glucose meter kit and supplies KIT Dispense based on patient and insurance preference. Use up to four times daily as directed. (FOR ICD-9 250.00, 250.01). 04/16/15   Denton Brick, MD  Blood Glucose Monitoring Suppl (BLOOD GLUCOSE MONITOR SYSTEM) w/Device KIT Use to check blood sugar 3 (three) times daily. 06/08/21   Medina-Vargas, Monina C, NP  carvedilol (COREG) 6.25 MG tablet Take 1 tablet (6.25 mg total) by mouth 2 (two) times daily with meals for hypertension 09/10/21     Continuous Blood Gluc Sensor (FREESTYLE LIBRE 2 SENSOR) MISC Place 1 sensor on the skin every 14 days. Use to check glucose continuously 02/05/21   Belva Agee, MD  Continuous Blood Gluc Sensor (FREESTYLE LIBRE 2 SENSOR) MISC Use as directed every 14 days 09/10/21   Schorr, Roma Kayser, FNP  Darbepoetin Alfa (ARANESP) 100 MCG/0.5ML SOSY injection Inject 0.5 mLs (100 mcg total) into the  vein every Wednesday with hemodialysis. 05/20/21   Andrey Campanile, MD  diclofenac Sodium (VOLTAREN) 1 % GEL Apply 4 g topically 4 (four) times daily. 05/18/22   Lyndle Herrlich, MD  DULoxetine (CYMBALTA) 60 MG capsule Take 1 capsule (60 mg total) by mouth daily. 05/18/22   Lyndle Herrlich, MD  ezetimibe (ZETIA) 10 MG tablet Take 1 tablet (10 mg total) by mouth daily. 11/18/21   Katsadouros, Vasilios, MD  fluticasone (FLONASE) 50 MCG/ACT nasal spray Place 1 spray into both nostrils daily. Patient not taking: Reported on 08/10/2021 06/03/21   Medina-Vargas, Monina C, NP  glucose blood (ACCU-CHEK AVIVA PLUS) test strip Check blood sugar 3 times a day 06/04/21   Medina-Vargas, Monina C, NP  insulin aspart (NOVOLOG) 100 UNIT/ML FlexPen Inject 5 Units into the skin 3 (three) times daily with meals. 03/03/22   Katsadouros, Vasilios, MD  insulin glargine (LANTUS) 100 UNIT/ML Solostar Pen Inject 15 Units into the skin at bedtime. 03/03/22   Katsadouros, Vasilios, MD  Insulin Pen Needle 32G X 4 MM MISC Use to inject insulin 4 (four) times daily. 06/04/21   Medina-Vargas, Monina C, NP  melatonin (KP MELATONIN) 3 MG TABS tablet Take 1 tablet (3 mg total) by mouth at bedtime for insomnia. 09/10/21     nicotine (NICODERM CQ - DOSED IN MG/24 HOURS) 21 mg/24hr patch Place 1 patch (21 mg total) onto the skin daily. 03/04/22   Belva Agee, MD  nicotine polacrilex (NICORETTE) 2 MG gum Take  1 each (2 mg total) by mouth as needed for smoking cessation. 03/04/22   Belva Agee, MD  pregabalin (LYRICA) 25 MG capsule Take 1 capsule by mouth once a day 06/15/22     rosuvastatin (CRESTOR) 10 MG tablet Take 1 tablet (10 mg total) by mouth daily. 11/18/21   Belva Agee, MD  traMADol (ULTRAM) 50 MG tablet Take 1 tablet (50 mg total) by mouth every 12 (twelve) hours as needed for pain 06/15/22     varenicline (CHANTIX) 0.5 MG tablet Take 1 tablet (0.5 mg total) by mouth daily. 03/03/22   Belva Agee, MD      Allergies    Benicar [olmesartan]    Review of Systems   Review of Systems  Constitutional:  Negative for fever.  Respiratory:  Negative for shortness of breath.   Cardiovascular:  Negative for chest pain.  Musculoskeletal:        Bilateral hand pain  Skin:  Negative for wound.  All other systems reviewed and are negative.   Physical Exam Updated Vital Signs BP (!) 198/89   Pulse 87   Temp 98.3 F (36.8 C) (Oral)   Resp 19   Ht 1.727 m (5\' 8" )   Wt 67.1 kg   SpO2 100%   BMI 22.49 kg/m  Physical Exam Vitals and nursing note reviewed.  Constitutional:      Appearance: He is well-developed. He is not ill-appearing.  HENT:     Head: Normocephalic and atraumatic.  Eyes:     Pupils: Pupils are equal, round, and reactive to light.  Cardiovascular:     Rate and Rhythm: Normal rate and regular rhythm.  Pulmonary:     Effort: Pulmonary effort is normal. No respiratory distress.  Abdominal:     Palpations: Abdomen is soft.     Tenderness: There is no abdominal tenderness.  Musculoskeletal:     Cervical back: Neck supple.     Comments: Bilateral AKA, tenderness palpation to the thenar eminence of the bilateral hands, no obvious deformities, normal flexion extension of the wrist and all 5 digits bilaterally, normal range of motion left shoulder without obvious dislocation or deformity  Lymphadenopathy:     Cervical: No cervical adenopathy.  Skin:    General: Skin is warm and dry.  Neurological:     Mental Status: He is alert and oriented to person, place, and time.  Psychiatric:        Mood and Affect: Mood normal.     ED Results / Procedures / Treatments   Labs (all labs ordered are listed, but only abnormal results are displayed) Labs Reviewed - No data to display  EKG None  Radiology No results found.  Procedures Procedures  {Document cardiac monitor, telemetry assessment procedure when appropriate:1}  Medications Ordered in  ED Medications  acetaminophen (TYLENOL) tablet 650 mg (650 mg Oral Given 06/23/22 2316)    ED Course/ Medical Decision Making/ A&P   {   Click here for ABCD2, HEART and other calculatorsREFRESH Note before signing :1}                          Medical Decision Making Amount and/or Complexity of Data Reviewed Radiology: ordered.  Risk OTC drugs.   ***  {Document critical care time when appropriate:1} {Document review of labs and clinical decision tools ie heart score, Chads2Vasc2 etc:1}  {Document your independent review of radiology images, and any outside records:1} {Document your discussion with family members,  caretakers, and with consultants:1} {Document social determinants of health affecting pt's care:1} {Document your decision making why or why not admission, treatments were needed:1} Final Clinical Impression(s) / ED Diagnoses Final diagnoses:  None    Rx / DC Orders ED Discharge Orders     None

## 2022-06-24 ENCOUNTER — Telehealth: Payer: Self-pay | Admitting: *Deleted

## 2022-06-24 DIAGNOSIS — I739 Peripheral vascular disease, unspecified: Secondary | ICD-10-CM | POA: Diagnosis not present

## 2022-06-24 DIAGNOSIS — E119 Type 2 diabetes mellitus without complications: Secondary | ICD-10-CM | POA: Diagnosis not present

## 2022-06-24 DIAGNOSIS — E1140 Type 2 diabetes mellitus with diabetic neuropathy, unspecified: Secondary | ICD-10-CM | POA: Diagnosis not present

## 2022-06-24 DIAGNOSIS — Z7401 Bed confinement status: Secondary | ICD-10-CM | POA: Diagnosis not present

## 2022-06-24 DIAGNOSIS — R6889 Other general symptoms and signs: Secondary | ICD-10-CM | POA: Diagnosis not present

## 2022-06-24 DIAGNOSIS — Z89612 Acquired absence of left leg above knee: Secondary | ICD-10-CM | POA: Diagnosis not present

## 2022-06-24 DIAGNOSIS — W19XXXA Unspecified fall, initial encounter: Secondary | ICD-10-CM | POA: Diagnosis not present

## 2022-06-24 DIAGNOSIS — E876 Hypokalemia: Secondary | ICD-10-CM | POA: Diagnosis not present

## 2022-06-24 DIAGNOSIS — Z743 Need for continuous supervision: Secondary | ICD-10-CM | POA: Diagnosis not present

## 2022-06-24 NOTE — Telephone Encounter (Signed)
I talked to pt's friend, Deloris Chief Operating Officer, who stated she's and pt's daughter are aware pt has not been to dialysis. She stated pt has not been in 2 weeks. Stated pt's daughter is trying to get a ramp built. Also stated pt only has 2 steps but transportation does not want to be responsible. She had called several transportation companies and they all stated the same thing about being responsible. She stated pt lives alone now. I mentioned calling renal coordinator - stated she had already talked to her and was given paperwork to fill out. I told her I had talked to Dr Evie Lacks - informed her if pt starts not feeling well, he needs to go to the ED for dialysis - stated she hates this but understand. Finally she said she will call and inform pt's family of what's going on and she will get in contact with the pt.

## 2022-06-24 NOTE — Discharge Instructions (Signed)
You were seen today after a fall.  Your x-rays do not show any fractures of the hand.  Take Tylenol as needed for pain.  Use ice.

## 2022-06-24 NOTE — ED Notes (Signed)
Patient transferred home via [facility] . Pt stable at time of transfer all paperwork sent with crew

## 2022-06-24 NOTE — Telephone Encounter (Signed)
Agree with verbal orders for OT and social work, but patient will need to figure out transportation. Attempted to call [name], he did not pick up. His voicemail verified it being his number, left voicemail notifying patient to call his dialysis center and speak with the renal navigator to see if they can help with transportation. Also recommended if he begins feeling ill or has any symptoms to call 911 as he may need urgent dialysis.   Our clinic is closed tomorrow but can someone please make note to call him Monday and follow up? I will try to call him and his sister in a little bit. Thanks

## 2022-06-24 NOTE — Telephone Encounter (Signed)
Call from [place] OT with [facility] . First, stated pt unable to go to dialysis d/t transportation issues. Stated had been using [employer] but they will not help pt up//down the steps. Second, pt had a fall and went to the ED yesterday and got home around 4Am this morning. Third, pt has no AC in his home; he only has a fan in his bedroom. But the rest of the house is warm. Verbal order given for "OT once a week x 5 weeks for upper body strengthening, coordination and help w/ADL's". Also verbal order given for Social Worker to help pt with transportation and home issues. Sending to PCP for approval or denial of verbal orders. Thanks

## 2022-06-26 DIAGNOSIS — N2581 Secondary hyperparathyroidism of renal origin: Secondary | ICD-10-CM | POA: Diagnosis not present

## 2022-06-26 DIAGNOSIS — N186 End stage renal disease: Secondary | ICD-10-CM | POA: Diagnosis not present

## 2022-06-26 DIAGNOSIS — R52 Pain, unspecified: Secondary | ICD-10-CM | POA: Diagnosis not present

## 2022-06-26 DIAGNOSIS — T8249XD Other complication of vascular dialysis catheter, subsequent encounter: Secondary | ICD-10-CM | POA: Diagnosis not present

## 2022-06-26 DIAGNOSIS — Z992 Dependence on renal dialysis: Secondary | ICD-10-CM | POA: Diagnosis not present

## 2022-06-28 ENCOUNTER — Other Ambulatory Visit: Payer: Self-pay | Admitting: Student

## 2022-06-28 ENCOUNTER — Other Ambulatory Visit (HOSPITAL_COMMUNITY): Payer: Self-pay

## 2022-06-28 ENCOUNTER — Encounter: Payer: 59 | Admitting: Internal Medicine

## 2022-06-28 ENCOUNTER — Other Ambulatory Visit: Payer: Self-pay

## 2022-06-28 ENCOUNTER — Telehealth: Payer: Self-pay

## 2022-06-28 ENCOUNTER — Other Ambulatory Visit: Payer: Self-pay | Admitting: Adult Health

## 2022-06-28 ENCOUNTER — Telehealth: Payer: Self-pay | Admitting: Student

## 2022-06-28 DIAGNOSIS — N186 End stage renal disease: Secondary | ICD-10-CM

## 2022-06-28 DIAGNOSIS — E119 Type 2 diabetes mellitus without complications: Secondary | ICD-10-CM

## 2022-06-28 DIAGNOSIS — E1152 Type 2 diabetes mellitus with diabetic peripheral angiopathy with gangrene: Secondary | ICD-10-CM

## 2022-06-28 DIAGNOSIS — I214 Non-ST elevation (NSTEMI) myocardial infarction: Secondary | ICD-10-CM

## 2022-06-28 MED ORDER — INSULIN LISPRO (1 UNIT DIAL) 100 UNIT/ML (KWIKPEN)
5.0000 [IU] | PEN_INJECTOR | Freq: Three times a day (TID) | SUBCUTANEOUS | 3 refills | Status: DC
  Filled 2022-06-28 – 2023-05-25 (×2): qty 15, 100d supply, fill #0

## 2022-06-28 NOTE — Telephone Encounter (Signed)
RTC to patient given option of appointment today.  Will call sister to bring him in for an appointment since he has been without meds x 2 weeks and is jittery.

## 2022-06-28 NOTE — Transitions of Care (Post Inpatient/ED Visit) (Signed)
   06/28/2022  Name: [name] MRN: [id number] DOB: [birth year]  Today's TOC FU Call Status: Today's TOC FU Call Status:: Unsuccessul Call (1st Attempt) Unsuccessful Call (1st Attempt) Date: 06/28/22  Attempted to reach the patient regarding the most recent Inpatient/ED visit.  Follow Up Plan: Additional outreach attempts will be made to reach the patient to complete the Transitions of Care (Post Inpatient/ED visit) call.   Jodelle Gross, RN, BSN, CCM Care Management Coordinator [place]/[facility]
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
## 2022-06-29 ENCOUNTER — Other Ambulatory Visit: Payer: Self-pay

## 2022-06-29 ENCOUNTER — Encounter: Payer: 59 | Admitting: Student

## 2022-06-29 ENCOUNTER — Other Ambulatory Visit (HOSPITAL_COMMUNITY): Payer: Self-pay

## 2022-06-29 DIAGNOSIS — N186 End stage renal disease: Secondary | ICD-10-CM | POA: Diagnosis not present

## 2022-06-29 DIAGNOSIS — T8249XD Other complication of vascular dialysis catheter, subsequent encounter: Secondary | ICD-10-CM | POA: Diagnosis not present

## 2022-06-29 DIAGNOSIS — Z992 Dependence on renal dialysis: Secondary | ICD-10-CM | POA: Diagnosis not present

## 2022-06-29 DIAGNOSIS — N2581 Secondary hyperparathyroidism of renal origin: Secondary | ICD-10-CM | POA: Diagnosis not present

## 2022-06-29 DIAGNOSIS — R52 Pain, unspecified: Secondary | ICD-10-CM | POA: Diagnosis not present

## 2022-06-29 MED ORDER — DULOXETINE HCL 60 MG PO CPEP: 60.0000 mg | ORAL_CAPSULE | Freq: Every day | ORAL | 3 refills | Status: DC

## 2022-06-29 MED ORDER — FREESTYLE LIBRE 2 SENSOR MISC
1.0000 | Freq: Every day | 3 refills | Status: DC
  Filled 2022-06-29: qty 2, 28d supply, fill #0

## 2022-06-29 MED ORDER — DICLOFENAC SODIUM 1 % EX GEL: 4.0000 g | Freq: Four times a day (QID) | CUTANEOUS | 3 refills | Status: DC

## 2022-06-29 MED ORDER — EZETIMIBE 10 MG PO TABS
10.0000 mg | ORAL_TABLET | Freq: Every day | ORAL | 3 refills | Status: DC
Start: 2022-06-29 — End: 2022-12-22
  Filled 2022-06-29: qty 90, 90d supply, fill #0

## 2022-06-29 NOTE — Telephone Encounter (Signed)
Next appt scheduled today 6/25 with Dr Amponsah. 

## 2022-06-29 NOTE — Telephone Encounter (Signed)
Next appt scheduled today 6/25 with Dr Kirke Corin.

## 2022-06-29 NOTE — Progress Notes (Signed)
  Care Coordination  Outreach Note  06/29/2022 Name: [name] MRN: [id number] DOB: [birth year]   Care Coordination Outreach Attempts: An unsuccessful telephone outreach was attempted today to offer the patient information about available care coordination services.  Follow Up Plan:  Additional outreach attempts will be made to offer the patient care coordination information and services.   Encounter Outcome:  No Answer  Christie Nottingham  Care Coordination Care Guide  Direct Dial: [phone]

## 2022-06-30 ENCOUNTER — Other Ambulatory Visit: Payer: Self-pay

## 2022-06-30 ENCOUNTER — Telehealth: Payer: Self-pay | Admitting: *Deleted

## 2022-06-30 ENCOUNTER — Other Ambulatory Visit (HOSPITAL_COMMUNITY): Payer: Self-pay

## 2022-06-30 ENCOUNTER — Telehealth: Payer: Self-pay

## 2022-06-30 NOTE — Telephone Encounter (Signed)
I agree with the plan. I wonder why he did not make the appt with me this week.

## 2022-06-30 NOTE — Transitions of Care (Post Inpatient/ED Visit) (Signed)
   06/30/2022  Name: [name] MRN: [id number] DOB: 05-21-1960  Today's TOC FU Call Status: Today's TOC FU Call Status:: Unsuccessful Call (2nd Attempt) Unsuccessful Call (2nd Attempt) Date: 06/30/22  Attempted to reach the patient regarding the most recent Inpatient/ED visit.  Follow Up Plan: Additional outreach attempts will be made to reach the patient to complete the Transitions of Care (Post Inpatient/ED visit) call.   Jodelle Gross, RN, BSN, CCM Care Management Coordinator [place]/[facility]

## 2022-06-30 NOTE — Progress Notes (Signed)
  Care Coordination   Note   06/30/2022 Name: [name] MRN: [id number] DOB: 1960/06/10  [name] is a 63 y.o. year old male who sees Belva Agee, MD for primary care. I reached out to Vivien Presto by phone today to offer care coordination services.  [name] was given information about Care Coordination services today including:   The Care Coordination services include support from the care team which includes your Nurse Coordinator, Clinical Social Worker, or Pharmacist.  The Care Coordination team is here to help remove barriers to the health concerns and goals most important to you. Care Coordination services are voluntary, and the patient may decline or stop services at any time by request to their care team member.   Care Coordination Consent Status: Patient agreed to services and verbal consent obtained.   Follow up plan:  Telephone appointment with care coordination team member scheduled for:  7/5 with [facility] and 7/8 with SW  Encounter Outcome:  Pt. Scheduled  [facility] Coordination Care Guide  Direct Dial: [phone]

## 2022-06-30 NOTE — Telephone Encounter (Signed)
[name] PT from [facility] requesting PT 1 time a week for 3 weeks.  Strengthening,  Scientist, physiological.Fall prevention.  Given OK.  Will forward to PCP for agreement or denial.

## 2022-07-01 ENCOUNTER — Telehealth: Payer: Self-pay

## 2022-07-01 ENCOUNTER — Other Ambulatory Visit (HOSPITAL_COMMUNITY): Payer: Self-pay

## 2022-07-01 NOTE — Transitions of Care (Post Inpatient/ED Visit) (Signed)
   07/01/2022  Name: Govanni Plemons MRN: [id number] DOB: 1960-06-01  Today's TOC FU Call Status: Today's TOC FU Call Status:: Unsuccessful Call (3rd Attempt) Unsuccessful Call (3rd Attempt) Date: 07/01/22  Attempted to reach the patient regarding the most recent Inpatient/ED visit.  Follow Up Plan: No further outreach attempts will be made at this time. We have been unable to contact the patient.  Jodelle Gross, RN, BSN, CCM Care Management Coordinator [place]/[facility]

## 2022-07-03 DIAGNOSIS — Z992 Dependence on renal dialysis: Secondary | ICD-10-CM | POA: Diagnosis not present

## 2022-07-03 DIAGNOSIS — N2581 Secondary hyperparathyroidism of renal origin: Secondary | ICD-10-CM | POA: Diagnosis not present

## 2022-07-03 DIAGNOSIS — R52 Pain, unspecified: Secondary | ICD-10-CM | POA: Diagnosis not present

## 2022-07-03 DIAGNOSIS — T8249XD Other complication of vascular dialysis catheter, subsequent encounter: Secondary | ICD-10-CM | POA: Diagnosis not present

## 2022-07-03 DIAGNOSIS — N186 End stage renal disease: Secondary | ICD-10-CM | POA: Diagnosis not present

## 2022-07-05 DIAGNOSIS — D631 Anemia in chronic kidney disease: Secondary | ICD-10-CM | POA: Diagnosis not present

## 2022-07-05 DIAGNOSIS — I70202 Unspecified atherosclerosis of native arteries of extremities, left leg: Secondary | ICD-10-CM | POA: Diagnosis not present

## 2022-07-05 DIAGNOSIS — E1140 Type 2 diabetes mellitus with diabetic neuropathy, unspecified: Secondary | ICD-10-CM | POA: Diagnosis not present

## 2022-07-05 DIAGNOSIS — E1122 Type 2 diabetes mellitus with diabetic chronic kidney disease: Secondary | ICD-10-CM | POA: Diagnosis not present

## 2022-07-05 DIAGNOSIS — N186 End stage renal disease: Secondary | ICD-10-CM | POA: Diagnosis not present

## 2022-07-05 DIAGNOSIS — E1151 Type 2 diabetes mellitus with diabetic peripheral angiopathy without gangrene: Secondary | ICD-10-CM | POA: Diagnosis not present

## 2022-07-07 ENCOUNTER — Ambulatory Visit: Payer: 59

## 2022-07-07 DIAGNOSIS — E1122 Type 2 diabetes mellitus with diabetic chronic kidney disease: Secondary | ICD-10-CM | POA: Diagnosis not present

## 2022-07-07 DIAGNOSIS — E1151 Type 2 diabetes mellitus with diabetic peripheral angiopathy without gangrene: Secondary | ICD-10-CM | POA: Diagnosis not present

## 2022-07-07 DIAGNOSIS — N186 End stage renal disease: Secondary | ICD-10-CM | POA: Diagnosis not present

## 2022-07-07 DIAGNOSIS — E1140 Type 2 diabetes mellitus with diabetic neuropathy, unspecified: Secondary | ICD-10-CM | POA: Diagnosis not present

## 2022-07-07 DIAGNOSIS — I70202 Unspecified atherosclerosis of native arteries of extremities, left leg: Secondary | ICD-10-CM | POA: Diagnosis not present

## 2022-07-07 DIAGNOSIS — D631 Anemia in chronic kidney disease: Secondary | ICD-10-CM | POA: Diagnosis not present

## 2022-07-09 ENCOUNTER — Telehealth: Payer: Self-pay

## 2022-07-09 NOTE — Patient Outreach (Signed)
  Care Coordination   07/09/2022 Name: [name] MRN: [id number] DOB: October 03, 1960   Care Coordination Outreach Attempts:  An unsuccessful telephone outreach was attempted today to offer the patient information about available care coordination services.  Follow Up Plan:  Additional outreach attempts will be made to offer the patient care coordination information and services.   Encounter Outcome:  No Answer   Care Coordination Interventions:  No, not indicated     Juanell Fairly RN, BSN, [facility] Care Coordinator [facility]   Phone: [phone]

## 2022-07-12 ENCOUNTER — Other Ambulatory Visit (HOSPITAL_COMMUNITY): Payer: Self-pay

## 2022-07-12 ENCOUNTER — Telehealth: Payer: Self-pay

## 2022-07-12 DIAGNOSIS — D631 Anemia in chronic kidney disease: Secondary | ICD-10-CM | POA: Diagnosis not present

## 2022-07-12 DIAGNOSIS — I70202 Unspecified atherosclerosis of native arteries of extremities, left leg: Secondary | ICD-10-CM | POA: Diagnosis not present

## 2022-07-12 DIAGNOSIS — N186 End stage renal disease: Secondary | ICD-10-CM | POA: Diagnosis not present

## 2022-07-12 DIAGNOSIS — E1122 Type 2 diabetes mellitus with diabetic chronic kidney disease: Secondary | ICD-10-CM | POA: Diagnosis not present

## 2022-07-12 DIAGNOSIS — E1151 Type 2 diabetes mellitus with diabetic peripheral angiopathy without gangrene: Secondary | ICD-10-CM | POA: Diagnosis not present

## 2022-07-12 DIAGNOSIS — E1140 Type 2 diabetes mellitus with diabetic neuropathy, unspecified: Secondary | ICD-10-CM | POA: Diagnosis not present

## 2022-07-12 NOTE — Patient Outreach (Signed)
  Care Coordination   07/12/2022 Name: Hennessy Ada MRN: [id number] DOB: [birth year]   Care Coordination Outreach Attempts:  An unsuccessful telephone outreach was attempted for a scheduled appointment today.  Follow Up Plan:  Additional outreach attempts will be made to offer the patient care coordination information and services.   Encounter Outcome:  No Answer   Care Coordination Interventions:  No, not indicated    Bevelyn Ngo, BSW, CDP Social Worker, Certified Dementia Practitioner [facility] Care Management  Care Coordination [phone]

## 2022-07-13 DIAGNOSIS — D631 Anemia in chronic kidney disease: Secondary | ICD-10-CM | POA: Diagnosis not present

## 2022-07-13 DIAGNOSIS — N186 End stage renal disease: Secondary | ICD-10-CM | POA: Diagnosis not present

## 2022-07-13 DIAGNOSIS — Z992 Dependence on renal dialysis: Secondary | ICD-10-CM | POA: Diagnosis not present

## 2022-07-13 DIAGNOSIS — T8249XD Other complication of vascular dialysis catheter, subsequent encounter: Secondary | ICD-10-CM | POA: Diagnosis not present

## 2022-07-13 DIAGNOSIS — T7840XA Allergy, unspecified, initial encounter: Secondary | ICD-10-CM | POA: Diagnosis not present

## 2022-07-13 DIAGNOSIS — N2581 Secondary hyperparathyroidism of renal origin: Secondary | ICD-10-CM | POA: Diagnosis not present

## 2022-07-14 ENCOUNTER — Telehealth: Payer: Self-pay

## 2022-07-14 NOTE — Patient Outreach (Signed)
  Care Coordination   07/14/2022 Name: [name] MRN: [id number] DOB: 1960/04/01   Care Coordination Outreach Attempts:  An unsuccessful telephone outreach was attempted for a scheduled appointment today.  Follow Up Plan:  Additional outreach attempts will be made to offer the patient care coordination information and services.   Encounter Outcome:  No Answer   Care Coordination Interventions:  No, not indicated    Bevelyn Ngo, BSW, CDP Social Worker, Certified Dementia Practitioner [facility] Care Management  Care Coordination [phone]

## 2022-07-16 DIAGNOSIS — E1122 Type 2 diabetes mellitus with diabetic chronic kidney disease: Secondary | ICD-10-CM | POA: Diagnosis not present

## 2022-07-16 DIAGNOSIS — D631 Anemia in chronic kidney disease: Secondary | ICD-10-CM | POA: Diagnosis not present

## 2022-07-16 DIAGNOSIS — N186 End stage renal disease: Secondary | ICD-10-CM | POA: Diagnosis not present

## 2022-07-16 DIAGNOSIS — E1140 Type 2 diabetes mellitus with diabetic neuropathy, unspecified: Secondary | ICD-10-CM | POA: Diagnosis not present

## 2022-07-16 DIAGNOSIS — E1151 Type 2 diabetes mellitus with diabetic peripheral angiopathy without gangrene: Secondary | ICD-10-CM | POA: Diagnosis not present

## 2022-07-16 DIAGNOSIS — I70202 Unspecified atherosclerosis of native arteries of extremities, left leg: Secondary | ICD-10-CM | POA: Diagnosis not present

## 2022-07-19 DIAGNOSIS — E1122 Type 2 diabetes mellitus with diabetic chronic kidney disease: Secondary | ICD-10-CM | POA: Diagnosis not present

## 2022-07-19 DIAGNOSIS — E1140 Type 2 diabetes mellitus with diabetic neuropathy, unspecified: Secondary | ICD-10-CM | POA: Diagnosis not present

## 2022-07-19 DIAGNOSIS — N186 End stage renal disease: Secondary | ICD-10-CM | POA: Diagnosis not present

## 2022-07-19 DIAGNOSIS — E1151 Type 2 diabetes mellitus with diabetic peripheral angiopathy without gangrene: Secondary | ICD-10-CM | POA: Diagnosis not present

## 2022-07-19 DIAGNOSIS — I70202 Unspecified atherosclerosis of native arteries of extremities, left leg: Secondary | ICD-10-CM | POA: Diagnosis not present

## 2022-07-19 DIAGNOSIS — D631 Anemia in chronic kidney disease: Secondary | ICD-10-CM | POA: Diagnosis not present

## 2022-07-21 DIAGNOSIS — N186 End stage renal disease: Secondary | ICD-10-CM | POA: Diagnosis not present

## 2022-07-21 DIAGNOSIS — E1151 Type 2 diabetes mellitus with diabetic peripheral angiopathy without gangrene: Secondary | ICD-10-CM | POA: Diagnosis not present

## 2022-07-21 DIAGNOSIS — E1140 Type 2 diabetes mellitus with diabetic neuropathy, unspecified: Secondary | ICD-10-CM | POA: Diagnosis not present

## 2022-07-21 DIAGNOSIS — E1122 Type 2 diabetes mellitus with diabetic chronic kidney disease: Secondary | ICD-10-CM | POA: Diagnosis not present

## 2022-07-21 DIAGNOSIS — D631 Anemia in chronic kidney disease: Secondary | ICD-10-CM | POA: Diagnosis not present

## 2022-07-21 DIAGNOSIS — I70202 Unspecified atherosclerosis of native arteries of extremities, left leg: Secondary | ICD-10-CM | POA: Diagnosis not present

## 2022-07-24 DIAGNOSIS — N186 End stage renal disease: Secondary | ICD-10-CM | POA: Diagnosis not present

## 2022-07-24 DIAGNOSIS — T7840XA Allergy, unspecified, initial encounter: Secondary | ICD-10-CM | POA: Diagnosis not present

## 2022-07-24 DIAGNOSIS — D631 Anemia in chronic kidney disease: Secondary | ICD-10-CM | POA: Diagnosis not present

## 2022-07-24 DIAGNOSIS — T8249XD Other complication of vascular dialysis catheter, subsequent encounter: Secondary | ICD-10-CM | POA: Diagnosis not present

## 2022-07-24 DIAGNOSIS — Z992 Dependence on renal dialysis: Secondary | ICD-10-CM | POA: Diagnosis not present

## 2022-07-24 DIAGNOSIS — N2581 Secondary hyperparathyroidism of renal origin: Secondary | ICD-10-CM | POA: Diagnosis not present

## 2022-07-27 ENCOUNTER — Telehealth: Payer: Self-pay | Admitting: *Deleted

## 2022-07-27 DIAGNOSIS — N2581 Secondary hyperparathyroidism of renal origin: Secondary | ICD-10-CM | POA: Diagnosis not present

## 2022-07-27 DIAGNOSIS — T7840XA Allergy, unspecified, initial encounter: Secondary | ICD-10-CM | POA: Diagnosis not present

## 2022-07-27 DIAGNOSIS — Z992 Dependence on renal dialysis: Secondary | ICD-10-CM | POA: Diagnosis not present

## 2022-07-27 DIAGNOSIS — D631 Anemia in chronic kidney disease: Secondary | ICD-10-CM | POA: Diagnosis not present

## 2022-07-27 DIAGNOSIS — N186 End stage renal disease: Secondary | ICD-10-CM | POA: Diagnosis not present

## 2022-07-27 DIAGNOSIS — T8249XD Other complication of vascular dialysis catheter, subsequent encounter: Secondary | ICD-10-CM | POA: Diagnosis not present

## 2022-07-27 NOTE — Telephone Encounter (Signed)
Call from [place], LPN [facility] requesting SW visit for patient to discuss  Community Resourses.  Verbal approval given will send to yellow  team for approval or denial.

## 2022-07-28 ENCOUNTER — Encounter: Payer: Self-pay | Admitting: *Deleted

## 2022-07-28 ENCOUNTER — Telehealth: Payer: Self-pay

## 2022-07-28 DIAGNOSIS — E1151 Type 2 diabetes mellitus with diabetic peripheral angiopathy without gangrene: Secondary | ICD-10-CM | POA: Diagnosis not present

## 2022-07-28 DIAGNOSIS — N186 End stage renal disease: Secondary | ICD-10-CM | POA: Diagnosis not present

## 2022-07-28 DIAGNOSIS — D631 Anemia in chronic kidney disease: Secondary | ICD-10-CM | POA: Diagnosis not present

## 2022-07-28 DIAGNOSIS — E1122 Type 2 diabetes mellitus with diabetic chronic kidney disease: Secondary | ICD-10-CM | POA: Diagnosis not present

## 2022-07-28 DIAGNOSIS — I70202 Unspecified atherosclerosis of native arteries of extremities, left leg: Secondary | ICD-10-CM | POA: Diagnosis not present

## 2022-07-28 DIAGNOSIS — E1140 Type 2 diabetes mellitus with diabetic neuropathy, unspecified: Secondary | ICD-10-CM | POA: Diagnosis not present

## 2022-07-28 NOTE — Patient Outreach (Signed)
  Care Coordination   07/28/2022 Name: [name] MRN: [id number] DOB: Jan 15, 1960   Care Coordination Outreach Attempts:  An unsuccessful telephone outreach was attempted for a scheduled appointment today. This was SW third attempt to outreach the patient to complete an initial assessment.  Follow Up Plan:  No further outreach attempts will be made at this time. We have been unable to contact the patient to offer or enroll patient in care coordination services  Encounter Outcome:  No Answer   Care Coordination Interventions:  No, not indicated    Bevelyn Ngo, BSW, CDP Social Worker, Certified Dementia Practitioner [facility] Care Management  Care Coordination [phone]

## 2022-07-28 NOTE — Progress Notes (Signed)
   Called in Error   [facility] Coordination Care Guide  Direct Dial: [phone]

## 2022-07-29 DIAGNOSIS — Z992 Dependence on renal dialysis: Secondary | ICD-10-CM | POA: Diagnosis not present

## 2022-07-29 DIAGNOSIS — N2581 Secondary hyperparathyroidism of renal origin: Secondary | ICD-10-CM | POA: Diagnosis not present

## 2022-07-29 DIAGNOSIS — T8249XD Other complication of vascular dialysis catheter, subsequent encounter: Secondary | ICD-10-CM | POA: Diagnosis not present

## 2022-07-29 DIAGNOSIS — N186 End stage renal disease: Secondary | ICD-10-CM | POA: Diagnosis not present

## 2022-07-29 DIAGNOSIS — D631 Anemia in chronic kidney disease: Secondary | ICD-10-CM | POA: Diagnosis not present

## 2022-07-29 DIAGNOSIS — T7840XA Allergy, unspecified, initial encounter: Secondary | ICD-10-CM | POA: Diagnosis not present

## 2022-07-31 DIAGNOSIS — Z992 Dependence on renal dialysis: Secondary | ICD-10-CM | POA: Diagnosis not present

## 2022-07-31 DIAGNOSIS — N186 End stage renal disease: Secondary | ICD-10-CM | POA: Diagnosis not present

## 2022-07-31 DIAGNOSIS — N2581 Secondary hyperparathyroidism of renal origin: Secondary | ICD-10-CM | POA: Diagnosis not present

## 2022-07-31 DIAGNOSIS — D631 Anemia in chronic kidney disease: Secondary | ICD-10-CM | POA: Diagnosis not present

## 2022-07-31 DIAGNOSIS — T7840XA Allergy, unspecified, initial encounter: Secondary | ICD-10-CM | POA: Diagnosis not present

## 2022-07-31 DIAGNOSIS — T8249XD Other complication of vascular dialysis catheter, subsequent encounter: Secondary | ICD-10-CM | POA: Diagnosis not present

## 2022-08-04 ENCOUNTER — Telehealth: Payer: Self-pay

## 2022-08-04 DIAGNOSIS — N186 End stage renal disease: Secondary | ICD-10-CM | POA: Diagnosis not present

## 2022-08-04 DIAGNOSIS — Z992 Dependence on renal dialysis: Secondary | ICD-10-CM | POA: Diagnosis not present

## 2022-08-04 DIAGNOSIS — E1122 Type 2 diabetes mellitus with diabetic chronic kidney disease: Secondary | ICD-10-CM | POA: Diagnosis not present

## 2022-08-04 NOTE — Patient Outreach (Signed)
  Care Coordination   08/04/2022 Name: [name] MRN: [id number] DOB: 27-Feb-1960   Care Coordination Outreach Attempts:  A second unsuccessful outreach was attempted today to offer the patient with information about available care coordination services.  Follow Up Plan:  Additional outreach attempts will be made to offer the patient care coordination information and services.   Encounter Outcome:  No Answer   Care Coordination Interventions:  No, not indicated     Juanell Fairly RN, BSN, [facility] Care Coordinator [facility]   Phone: [phone]

## 2022-08-05 DIAGNOSIS — N186 End stage renal disease: Secondary | ICD-10-CM | POA: Diagnosis not present

## 2022-08-05 DIAGNOSIS — Z992 Dependence on renal dialysis: Secondary | ICD-10-CM | POA: Diagnosis not present

## 2022-08-05 DIAGNOSIS — T8249XD Other complication of vascular dialysis catheter, subsequent encounter: Secondary | ICD-10-CM | POA: Diagnosis not present

## 2022-08-05 DIAGNOSIS — N2581 Secondary hyperparathyroidism of renal origin: Secondary | ICD-10-CM | POA: Diagnosis not present

## 2022-08-06 NOTE — Progress Notes (Signed)
This encounter was created in error - please disregard.

## 2022-08-09 ENCOUNTER — Telehealth: Payer: Self-pay | Admitting: *Deleted

## 2022-08-09 NOTE — Progress Notes (Unsigned)
  Care Coordination Note  08/09/2022 Name: [name] MRN: [id number] DOB: [birth year]  [name] is a 62 y.o. year old male who is a primary care patient of Morrie Sheldon, MD and is actively engaged with the care management team. I reached out to Vivien Presto by phone today to assist with re-scheduling an initial visit with the RN Case Manager  Follow up plan: Unsuccessful telephone outreach attempt made. A HIPAA compliant phone message was left for the patient providing contact information and requesting a return call.   [facility]  Care Coordination Care Guide  Direct Dial: [phone]

## 2022-08-11 NOTE — Progress Notes (Signed)
  Care Coordination   Note   08/11/2022 Name: [name] MRN: [id number] DOB: 20-Apr-1960  [name] is a 62 y.o. year old male who sees Morrie Sheldon, MD for primary care. I reached out to Vivien Presto by phone today to offer care coordination services.  [name] was given information about Care Coordination services today including:   The Care Coordination services include support from the care team which includes your Nurse Coordinator, Clinical Social Worker, or Pharmacist.  The Care Coordination team is here to help remove barriers to the health concerns and goals most important to you. Care Coordination services are voluntary, and the patient may decline or stop services at any time by request to their care team member.   Care Coordination Consent Status: Patient agreed to services and verbal consent obtained.   Follow up plan:  Telephone appointment with care coordination team member scheduled for:  8/14 and 8/15  Encounter Outcome:  patient scheduled   [facility] Coordination Care Guide  Direct Dial: [phone]

## 2022-08-18 ENCOUNTER — Telehealth: Payer: Self-pay

## 2022-08-18 NOTE — Patient Outreach (Signed)
  Care Coordination   08/18/2022 Name: [name] MRN: [id number] DOB: [birth year]   Care Coordination Outreach Attempts:  An unsuccessful telephone outreach was attempted today to offer the patient information about available care coordination services.  Follow Up Plan:  Additional outreach attempts will be made to offer the patient care coordination information and services.   Encounter Outcome:  No Answer   Care Coordination Interventions:  No, not indicated    Juanell Fairly RN, BSN, [facility] Care Coordinator [facility]   Phone: [phone]

## 2022-08-19 ENCOUNTER — Telehealth: Payer: Self-pay

## 2022-08-19 NOTE — Patient Outreach (Signed)
  Care Coordination   08/19/2022 Name: [name] MRN: [id number] DOB: 1960/12/26   Care Coordination Outreach Attempts:  SW placed a fourth unsuccessful outbound call to the patient to follow up on a referral received for care coordination. SW attempted to contact "[name]" per patients request on both numbers provided.  Follow Up Plan:  No further outreach attempts will be made at this time. We have been unable to contact the patient to offer or enroll patient in care coordination services  Encounter Outcome:  No Answer   Care Coordination Interventions:  No, not indicated    Bevelyn Ngo, BSW, CDP Social Worker, Certified Dementia Practitioner [facility] Care Management  Care Coordination [phone]

## 2022-08-21 DIAGNOSIS — T8249XD Other complication of vascular dialysis catheter, subsequent encounter: Secondary | ICD-10-CM | POA: Diagnosis not present

## 2022-08-21 DIAGNOSIS — N2581 Secondary hyperparathyroidism of renal origin: Secondary | ICD-10-CM | POA: Diagnosis not present

## 2022-08-21 DIAGNOSIS — Z992 Dependence on renal dialysis: Secondary | ICD-10-CM | POA: Diagnosis not present

## 2022-08-21 DIAGNOSIS — N186 End stage renal disease: Secondary | ICD-10-CM | POA: Diagnosis not present

## 2022-08-24 ENCOUNTER — Inpatient Hospital Stay (HOSPITAL_COMMUNITY)
Admission: EM | Admit: 2022-08-24 | Discharge: 2022-09-02 | DRG: 314 | Disposition: A | Discharge status: Skilled Nursing Facility | Payer: 59 | Attending: Internal Medicine | Admitting: Internal Medicine

## 2022-08-24 ENCOUNTER — Emergency Department (HOSPITAL_COMMUNITY): Payer: 59

## 2022-08-24 ENCOUNTER — Encounter (HOSPITAL_COMMUNITY): Payer: Self-pay

## 2022-08-24 ENCOUNTER — Other Ambulatory Visit: Payer: Self-pay

## 2022-08-24 DIAGNOSIS — D696 Thrombocytopenia, unspecified: Secondary | ICD-10-CM | POA: Diagnosis not present

## 2022-08-24 DIAGNOSIS — N2581 Secondary hyperparathyroidism of renal origin: Secondary | ICD-10-CM | POA: Diagnosis present

## 2022-08-24 DIAGNOSIS — M62542 Muscle wasting and atrophy, not elsewhere classified, left hand: Secondary | ICD-10-CM | POA: Diagnosis present

## 2022-08-24 DIAGNOSIS — E1151 Type 2 diabetes mellitus with diabetic peripheral angiopathy without gangrene: Secondary | ICD-10-CM | POA: Diagnosis not present

## 2022-08-24 DIAGNOSIS — B964 Proteus (mirabilis) (morganii) as the cause of diseases classified elsewhere: Secondary | ICD-10-CM | POA: Diagnosis not present

## 2022-08-24 DIAGNOSIS — R7881 Bacteremia: Secondary | ICD-10-CM | POA: Diagnosis present

## 2022-08-24 DIAGNOSIS — Z794 Long term (current) use of insulin: Secondary | ICD-10-CM

## 2022-08-24 DIAGNOSIS — S0990XA Unspecified injury of head, initial encounter: Secondary | ICD-10-CM | POA: Diagnosis not present

## 2022-08-24 DIAGNOSIS — E785 Hyperlipidemia, unspecified: Secondary | ICD-10-CM | POA: Diagnosis not present

## 2022-08-24 DIAGNOSIS — G9341 Metabolic encephalopathy: Secondary | ICD-10-CM | POA: Diagnosis present

## 2022-08-24 DIAGNOSIS — U071 COVID-19: Secondary | ICD-10-CM | POA: Diagnosis present

## 2022-08-24 DIAGNOSIS — B961 Klebsiella pneumoniae [K. pneumoniae] as the cause of diseases classified elsewhere: Secondary | ICD-10-CM | POA: Diagnosis present

## 2022-08-24 DIAGNOSIS — W19XXXA Unspecified fall, initial encounter: Secondary | ICD-10-CM | POA: Diagnosis present

## 2022-08-24 DIAGNOSIS — I5022 Chronic systolic (congestive) heart failure: Secondary | ICD-10-CM | POA: Diagnosis not present

## 2022-08-24 DIAGNOSIS — R404 Transient alteration of awareness: Secondary | ICD-10-CM | POA: Diagnosis not present

## 2022-08-24 DIAGNOSIS — E876 Hypokalemia: Secondary | ICD-10-CM | POA: Diagnosis present

## 2022-08-24 DIAGNOSIS — Z888 Allergy status to other drugs, medicaments and biological substances status: Secondary | ICD-10-CM

## 2022-08-24 DIAGNOSIS — Z833 Family history of diabetes mellitus: Secondary | ICD-10-CM

## 2022-08-24 DIAGNOSIS — I120 Hypertensive chronic kidney disease with stage 5 chronic kidney disease or end stage renal disease: Secondary | ICD-10-CM | POA: Diagnosis not present

## 2022-08-24 DIAGNOSIS — Z8701 Personal history of pneumonia (recurrent): Secondary | ICD-10-CM

## 2022-08-24 DIAGNOSIS — F17210 Nicotine dependence, cigarettes, uncomplicated: Secondary | ICD-10-CM | POA: Diagnosis present

## 2022-08-24 DIAGNOSIS — E1140 Type 2 diabetes mellitus with diabetic neuropathy, unspecified: Secondary | ICD-10-CM | POA: Diagnosis not present

## 2022-08-24 DIAGNOSIS — Z91158 Patient's noncompliance with renal dialysis for other reason: Secondary | ICD-10-CM

## 2022-08-24 DIAGNOSIS — R509 Fever, unspecified: Secondary | ICD-10-CM | POA: Diagnosis not present

## 2022-08-24 DIAGNOSIS — B9689 Other specified bacterial agents as the cause of diseases classified elsewhere: Secondary | ICD-10-CM | POA: Diagnosis present

## 2022-08-24 DIAGNOSIS — E871 Hypo-osmolality and hyponatremia: Secondary | ICD-10-CM | POA: Diagnosis present

## 2022-08-24 DIAGNOSIS — Z560 Unemployment, unspecified: Secondary | ICD-10-CM

## 2022-08-24 DIAGNOSIS — N3000 Acute cystitis without hematuria: Secondary | ICD-10-CM | POA: Diagnosis present

## 2022-08-24 DIAGNOSIS — R6889 Other general symptoms and signs: Secondary | ICD-10-CM | POA: Diagnosis not present

## 2022-08-24 DIAGNOSIS — Y848 Other medical procedures as the cause of abnormal reaction of the patient, or of later complication, without mention of misadventure at the time of the procedure: Secondary | ICD-10-CM | POA: Diagnosis present

## 2022-08-24 DIAGNOSIS — G4700 Insomnia, unspecified: Secondary | ICD-10-CM | POA: Diagnosis present

## 2022-08-24 DIAGNOSIS — D631 Anemia in chronic kidney disease: Secondary | ICD-10-CM | POA: Diagnosis not present

## 2022-08-24 DIAGNOSIS — Z79899 Other long term (current) drug therapy: Secondary | ICD-10-CM

## 2022-08-24 DIAGNOSIS — N186 End stage renal disease: Secondary | ICD-10-CM | POA: Diagnosis not present

## 2022-08-24 DIAGNOSIS — I499 Cardiac arrhythmia, unspecified: Secondary | ICD-10-CM | POA: Diagnosis not present

## 2022-08-24 DIAGNOSIS — Z992 Dependence on renal dialysis: Secondary | ICD-10-CM

## 2022-08-24 DIAGNOSIS — E1122 Type 2 diabetes mellitus with diabetic chronic kidney disease: Secondary | ICD-10-CM | POA: Diagnosis present

## 2022-08-24 DIAGNOSIS — Z89612 Acquired absence of left leg above knee: Secondary | ICD-10-CM | POA: Diagnosis not present

## 2022-08-24 DIAGNOSIS — A419 Sepsis, unspecified organism: Secondary | ICD-10-CM

## 2022-08-24 DIAGNOSIS — R410 Disorientation, unspecified: Secondary | ICD-10-CM | POA: Diagnosis not present

## 2022-08-24 DIAGNOSIS — I951 Orthostatic hypotension: Secondary | ICD-10-CM | POA: Diagnosis not present

## 2022-08-24 DIAGNOSIS — I132 Hypertensive heart and chronic kidney disease with heart failure and with stage 5 chronic kidney disease, or end stage renal disease: Secondary | ICD-10-CM | POA: Diagnosis not present

## 2022-08-24 DIAGNOSIS — A4189 Other specified sepsis: Secondary | ICD-10-CM | POA: Diagnosis not present

## 2022-08-24 DIAGNOSIS — E8889 Other specified metabolic disorders: Secondary | ICD-10-CM | POA: Diagnosis not present

## 2022-08-24 DIAGNOSIS — R4182 Altered mental status, unspecified: Secondary | ICD-10-CM | POA: Diagnosis not present

## 2022-08-24 DIAGNOSIS — Z7982 Long term (current) use of aspirin: Secondary | ICD-10-CM

## 2022-08-24 DIAGNOSIS — K219 Gastro-esophageal reflux disease without esophagitis: Secondary | ICD-10-CM | POA: Diagnosis present

## 2022-08-24 DIAGNOSIS — Z91148 Patient's other noncompliance with medication regimen for other reason: Secondary | ICD-10-CM

## 2022-08-24 DIAGNOSIS — M62541 Muscle wasting and atrophy, not elsewhere classified, right hand: Secondary | ICD-10-CM | POA: Diagnosis present

## 2022-08-24 DIAGNOSIS — Z8249 Family history of ischemic heart disease and other diseases of the circulatory system: Secondary | ICD-10-CM

## 2022-08-24 DIAGNOSIS — G9340 Encephalopathy, unspecified: Secondary | ICD-10-CM | POA: Diagnosis not present

## 2022-08-24 DIAGNOSIS — Z743 Need for continuous supervision: Secondary | ICD-10-CM | POA: Diagnosis not present

## 2022-08-24 DIAGNOSIS — R059 Cough, unspecified: Secondary | ICD-10-CM | POA: Diagnosis not present

## 2022-08-24 DIAGNOSIS — N390 Urinary tract infection, site not specified: Secondary | ICD-10-CM | POA: Diagnosis not present

## 2022-08-24 DIAGNOSIS — Z993 Dependence on wheelchair: Secondary | ICD-10-CM

## 2022-08-24 DIAGNOSIS — T80211A Bloodstream infection due to central venous catheter, initial encounter: Principal | ICD-10-CM | POA: Diagnosis present

## 2022-08-24 DIAGNOSIS — Z89511 Acquired absence of right leg below knee: Secondary | ICD-10-CM

## 2022-08-24 DIAGNOSIS — E1110 Type 2 diabetes mellitus with ketoacidosis without coma: Secondary | ICD-10-CM | POA: Diagnosis not present

## 2022-08-24 DIAGNOSIS — R413 Other amnesia: Secondary | ICD-10-CM | POA: Diagnosis present

## 2022-08-24 DIAGNOSIS — D72829 Elevated white blood cell count, unspecified: Secondary | ICD-10-CM | POA: Diagnosis not present

## 2022-08-24 DIAGNOSIS — T8571XA Infection and inflammatory reaction due to peritoneal dialysis catheter, initial encounter: Secondary | ICD-10-CM | POA: Diagnosis not present

## 2022-08-24 DIAGNOSIS — R079 Chest pain, unspecified: Secondary | ICD-10-CM | POA: Diagnosis not present

## 2022-08-24 LAB — COMPREHENSIVE METABOLIC PANEL
ALT: 16 U/L (ref 0–44)
AST: 34 U/L (ref 15–41)
Albumin: 3.2 g/dL — ABNORMAL LOW (ref 3.5–5.0)
Alkaline Phosphatase: 59 U/L (ref 38–126)
Anion gap: 18 — ABNORMAL HIGH (ref 5–15)
BUN: 73 mg/dL — ABNORMAL HIGH (ref 8–23)
CO2: 17 mmol/L — ABNORMAL LOW (ref 22–32)
Calcium: 8.7 mg/dL — ABNORMAL LOW (ref 8.9–10.3)
Chloride: 96 mmol/L — ABNORMAL LOW (ref 98–111)
Creatinine, Ser: 7.29 mg/dL — ABNORMAL HIGH (ref 0.61–1.24)
GFR, Estimated: 8 mL/min — ABNORMAL LOW (ref >60)
Glucose, Bld: 193 mg/dL — ABNORMAL HIGH (ref 70–99)
Potassium: 4.1 mmol/L (ref 3.5–5.1)
Sodium: 131 mmol/L — ABNORMAL LOW (ref 135–145)
Total Bilirubin: 0.9 mg/dL (ref 0.3–1.2)
Total Protein: 7.3 g/dL (ref 6.5–8.1)

## 2022-08-24 LAB — URINALYSIS, W/ REFLEX TO CULTURE (INFECTION SUSPECTED)
Bilirubin Urine: NEGATIVE
Glucose, UA: NEGATIVE mg/dL
Ketones, ur: 5 mg/dL — AB
Nitrite: NEGATIVE
Protein, ur: >=300 mg/dL — AB
RBC / HPF: >50 RBC/hpf (ref 0–5)
Specific Gravity, Urine: 1.013 (ref 1.005–1.030)
WBC, UA: >50 WBC/hpf (ref 0–5)
pH: 5 (ref 5.0–8.0)

## 2022-08-24 LAB — CBC WITH DIFFERENTIAL/PLATELET
Abs Immature Granulocytes: 0.07 10*3/uL (ref 0.00–0.07)
Basophils Absolute: 0 10*3/uL (ref 0.0–0.1)
Basophils Relative: 0 %
Eosinophils Absolute: 0 10*3/uL (ref 0.0–0.5)
Eosinophils Relative: 0 %
HCT: 34.5 % — ABNORMAL LOW (ref 39.0–52.0)
Hemoglobin: 11.4 g/dL — ABNORMAL LOW (ref 13.0–17.0)
Immature Granulocytes: 1 %
Lymphocytes Relative: 6 %
Lymphs Abs: 0.8 10*3/uL (ref 0.7–4.0)
MCH: 29.7 pg (ref 26.0–34.0)
MCHC: 33 g/dL (ref 30.0–36.0)
MCV: 89.8 fL (ref 80.0–100.0)
Monocytes Absolute: 1.1 10*3/uL — ABNORMAL HIGH (ref 0.1–1.0)
Monocytes Relative: 7 %
Neutro Abs: 12.3 10*3/uL — ABNORMAL HIGH (ref 1.7–7.7)
Neutrophils Relative %: 86 %
Platelets: 125 10*3/uL — ABNORMAL LOW (ref 150–400)
RBC: 3.84 MIL/uL — ABNORMAL LOW (ref 4.22–5.81)
RDW: 12.7 % (ref 11.5–15.5)
WBC: 14.2 10*3/uL — ABNORMAL HIGH (ref 4.0–10.5)
nRBC: 0 % (ref 0.0–0.2)

## 2022-08-24 LAB — RESP PANEL BY RT-PCR (RSV, FLU A&B, COVID)  RVPGX2
Influenza A by PCR: NEGATIVE
Influenza B by PCR: NEGATIVE
Resp Syncytial Virus by PCR: NEGATIVE
SARS Coronavirus 2 by RT PCR: POSITIVE — AB

## 2022-08-24 LAB — I-STAT CG4 LACTIC ACID, ED
Lactic Acid, Venous: 2 mmol/L (ref 0.5–1.9)
Lactic Acid, Venous: 1.2 mmol/L (ref 0.5–1.9)

## 2022-08-24 MED ORDER — LACTATED RINGERS IV SOLN: INTRAVENOUS | Status: DC

## 2022-08-24 MED ORDER — VANCOMYCIN HCL IN DEXTROSE 1-5 GM/200ML-% IV SOLN: 1000.0000 mg | Freq: Once | INTRAVENOUS | Status: DC

## 2022-08-24 MED ORDER — METRONIDAZOLE 500 MG/100ML IV SOLN
500.0000 mg | Freq: Once | INTRAVENOUS | Status: AC
  Administered 2022-08-24: 500 mg via INTRAVENOUS
  Filled 2022-08-24: qty 100

## 2022-08-24 MED ORDER — ACETAMINOPHEN 325 MG PO TABS
650.0000 mg | ORAL_TABLET | Freq: Once | ORAL | Status: AC
  Administered 2022-08-24: 650 mg via ORAL
  Filled 2022-08-24: qty 2

## 2022-08-24 MED ORDER — VANCOMYCIN HCL 1250 MG/250ML IV SOLN
1250.0000 mg | Freq: Once | INTRAVENOUS | Status: AC
  Administered 2022-08-24: 1250 mg via INTRAVENOUS
  Filled 2022-08-24: qty 250

## 2022-08-24 MED ORDER — IPRATROPIUM-ALBUTEROL 0.5-2.5 (3) MG/3ML IN SOLN
3.0000 mL | RESPIRATORY_TRACT | Status: DC
  Administered 2022-08-24 (×2): 3 mL via RESPIRATORY_TRACT
  Filled 2022-08-24 (×3): qty 3

## 2022-08-24 MED ORDER — INSULIN ASPART 100 UNIT/ML IJ SOLN
0.0000 [IU] | Freq: Three times a day (TID) | INTRAMUSCULAR | Status: DC
  Administered 2022-08-25 (×2): 1 [IU] via SUBCUTANEOUS
  Administered 2022-08-25: 3 [IU] via SUBCUTANEOUS
  Administered 2022-08-26: 1 [IU] via SUBCUTANEOUS
  Administered 2022-08-26 – 2022-08-31 (×9): 2 [IU] via SUBCUTANEOUS
  Administered 2022-08-31: 1 [IU] via SUBCUTANEOUS
  Administered 2022-09-01 (×2): 2 [IU] via SUBCUTANEOUS
  Administered 2022-09-02: 1 [IU] via SUBCUTANEOUS
  Administered 2022-09-02: 2 [IU] via SUBCUTANEOUS
  Administered 2022-09-02: 3 [IU] via SUBCUTANEOUS

## 2022-08-24 MED ORDER — ACETAMINOPHEN 325 MG PO TABS
650.0000 mg | ORAL_TABLET | Freq: Four times a day (QID) | ORAL | Status: DC | PRN
  Administered 2022-08-24 – 2022-08-25 (×2): 650 mg via ORAL
  Filled 2022-08-24 (×2): qty 2

## 2022-08-24 MED ORDER — INSULIN ASPART 100 UNIT/ML IJ SOLN
0.0000 [IU] | Freq: Every day | INTRAMUSCULAR | Status: DC
  Administered 2022-09-01: 3 [IU] via SUBCUTANEOUS

## 2022-08-24 MED ORDER — SENNOSIDES-DOCUSATE SODIUM 8.6-50 MG PO TABS: 1.0000 | ORAL_TABLET | Freq: Every evening | ORAL | Status: DC | PRN

## 2022-08-24 MED ORDER — ACETAMINOPHEN 650 MG RE SUPP: 650.0000 mg | Freq: Four times a day (QID) | RECTAL | Status: DC | PRN

## 2022-08-24 MED ORDER — HEPARIN SODIUM (PORCINE) 5000 UNIT/ML IJ SOLN
5000.0000 [IU] | Freq: Three times a day (TID) | INTRAMUSCULAR | Status: DC
  Administered 2022-08-24 – 2022-09-02 (×25): 5000 [IU] via SUBCUTANEOUS
  Filled 2022-08-24 (×25): qty 1

## 2022-08-24 MED ORDER — SODIUM CHLORIDE 0.9 % IV SOLN
2.0000 g | Freq: Once | INTRAVENOUS | Status: AC
  Administered 2022-08-24: 2 g via INTRAVENOUS
  Filled 2022-08-24: qty 12.5

## 2022-08-24 NOTE — ED Triage Notes (Signed)
Pt bib ems from home; family reports AMS, last seen 2 days ago; has not gone to dialysis x 2 weeks; pt says he has been ill, cough noted by ems; alert oriented to to place, day, self; family reports normally a and o x 4; dialysis catheter in place, R chest; pt found lying on floor on ems arrival next to prosthetic leg; 18 ga RAC; 142/68, hr 106, RR 20, 95% RA

## 2022-08-24 NOTE — ED Notes (Signed)
Patient transported to CT 

## 2022-08-24 NOTE — ED Notes (Signed)
Pt incontinent of stool; pt cleaned, placed in new brief

## 2022-08-24 NOTE — H&P (Incomplete)
Date: 08/24/2022               Patient Name:  [name] MRN: [id number]  DOB: 06-06-1960 Age / Sex: 62 y.o., male   PCP: Morrie Sheldon, MD         Medical Service: Internal Medicine Teaching Service         Attending Physician: Dr. Earl Lagos, MD      First Contact: Dr. Cleon Gustin, DO  [phone]    Second Contact: Dr. Modena Slater, DO  [phone]         After Hours (After 5p/  First Contact Pager: [phone]  weekends / holidays): Second Contact Pager: [phone]   SUBJECTIVE   Chief Complaint: Altered mental status   History of Present Illness: [name] is a 62 year old male with history of ESRD on hemodialysis, type 2 diabetes mellitus, hypertension, HFrEF, hyperlipidemia, and PAD who presented to the ED with altered mental status.  Parts of the history are limited by somnolence and altered mental status of the patient.  Per the ED note, he was last seen by family 2 days ago.  He has not had his dialysis for 2 weeks and at baseline is alert and oriented x 4.  He was noted to have had a fall recently but did not hit his head.  After talking with the patient, he does not remember coming to the hospital.  His last memory was getting on the bus 2 days ago with his cousin but he cannot remember why he was getting on the bus.  He also notes a cough that is developed during this time.  Since arriving in the ED, he stated that both of his hands felt cold but were not associated with any numbness or tingling but it was harder to squeeze his hands.  He denies any chest pain, headache, congestion, rhinorrhea, or nausea or vomiting.  He also denies any dysuria, hematuria, or other urinary symptoms.  He notes that he urinates approximately 2 times per day.  He was last seen in the ED on 06/23/2022 following a fall.  He came to the ED because his wheelchair got stuck and flipped forward but he did not hit his head or lose consciousness.  He landed on his hands but x-rays were negative  for any fractures.  He was last admitted in the hospital on 05/13/2022 for diffuse pain and need for dialysis access workup.  He had presented with a low-grade temp of 100.1 and bodyaches/fatigue but no other clear infectious symptoms or exam findings.  It was noted that his indwelling HD catheter was concerning for nidus of infection due to his lack of sterility and his HD cath remained in place.  It was also noted that he has a history of missed dialysis sessions secondary to transportation.  ED Course: On admission, noted to have had a temp of 101.1, heart rate of 103, and blood pressure of 136/70.  Blood cultures were obtained.  CMP was notable for hyponatremia, hyperglycemia, and an anion gap metabolic acidosis.  His CBC was notable for leukocytosis, normocytic anemia, and thrombocytopenia.  His UA was notable for proteinuria, ketones, and bacteriuria. Urine cultures pending.  He received cefepime, Flagyl, and vancomycin.  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  Admission on 05/13/2022 noted smoking 1 pack of cigarettes every 5 days.  Family History:   Per chart review Hypertension: Brother, sister Diabetes: Mother  Allergies: Allergies as of 08/24/2022 - Review Complete 08/24/2022  Allergen Reaction Noted  . Benicar [olmesartan] Cough 09/28/2013    Review of Systems: A complete ROS was negative except as per HPI.   OBJECTIVE:   Physical Exam: Blood pressure 119/61, pulse 82, temperature 97.7 F (36.5 C), temperature source Temporal, resp. rate (!) 21, SpO2 99%.  Constitutional: well-appearing, no acute distress HENT: normocephalic atraumatic, mucous membranes moist Eyes: conjunctiva non-erythematous Neck: supple Cardiovascular: regular rate and rhythm, no m/r/g Pulmonary/Chest: Tachypneic; bilateral rales at lung bases, dialysis catheter at right chest wall with no erythema Abdominal: soft, non-tender, non-distended MSK: Amputation of left above-the-knee and right below the knee Neurological: Oriented to name, place, and location but not reason for hospitalization.  Able to answer most questions Skin: warm and dry Psych: Somnolent and intermittently confused  Labs: CBC    Component Value Date/Time   WBC 14.2 (H) 08/24/2022 1603   RBC 3.84 (L) 08/24/2022 1603   HGB 11.4 (L) 08/24/2022 1603   HGB 11.1 (L) 09/22/2021 1532   HCT 34.5 (L) 08/24/2022 1603   HCT 33.9 (L) 09/22/2021 1532   PLT 125 (L) 08/24/2022 1603   PLT 243 09/22/2021 1532   MCV 89.8 08/24/2022 1603   MCV 89 09/22/2021 1532   MCH 29.7 08/24/2022 1603   MCHC 33.0 08/24/2022 1603   RDW 12.7 08/24/2022 1603   RDW 15.2 09/22/2021 1532   LYMPHSABS 0.8 08/24/2022 1603   LYMPHSABS 1.7 02/03/2021 1634   MONOABS 1.1 (H) 08/24/2022 1603   EOSABS 0.0 08/24/2022 1603   EOSABS 0.2 02/03/2021 1634   BASOSABS 0.0 08/24/2022 1603   BASOSABS 0.0 02/03/2021 1634     CMP     Component Value Date/Time   NA 131 (L) 08/24/2022 1603   NA 136 09/22/2021 1532   K 4.1 08/24/2022 1603   CL 96 (L) 08/24/2022 1603   CO2 17 (L) 08/24/2022 1603   GLUCOSE 193 (H)  08/24/2022 1603   BUN 73 (H) 08/24/2022 1603   BUN 22 09/22/2021 1532   CREATININE 7.29 (H) 08/24/2022 1603   CREATININE 0.85 05/30/2013 1100   CALCIUM 8.7 (L) 08/24/2022 1603   PROT 7.3 08/24/2022 1603   PROT 7.1 02/03/2021 1634   ALBUMIN 3.2 (L) 08/24/2022 1603   ALBUMIN 4.0 02/03/2021 1634   AST 34 08/24/2022 1603   ALT 16 08/24/2022 1603   ALKPHOS 59 08/24/2022 1603   BILITOT 0.9 08/24/2022 1603   BILITOT 0.4 02/03/2021 1634   GFRNONAA 8 (L) 08/24/2022 1603   GFRNONAA 70 04/11/2013 0825   GFRAA 33 (L) 08/01/2019 1510   GFRAA 81 04/11/2013 0825    Imaging:  DG Chest Port 1 View There is no edema, consolidation, effusion, or pneumothorax  CT Head WO Contrast  IMPRESSION: 1. No acute intracranial process. 2.  Air-fluid levels throughout the paranasal sinuses worrisome for acute sinusitis.  EKG: personally reviewed my interpretation is sinus tachycardia.  ASSESSMENT & PLAN:   Assessment & Plan by Problem: Principal Problem:   Acute metabolic encephalopathy  [name] is a 62 year old male with history of ESRD on hemodialysis, type 2 diabetes mellitus, hypertension, HFrEF, hyperlipidemia, and PAD who presented to the ED with altered mental status and admitted for acute metabolic encephalopathy likely secondary to UTI and noncompliant hemodialysis on ESRD.  #Acute metabolic encephalopathy #UTI with concern for sepsis #Leukocytosis #Lactic Acidosis, Resolved  UA positive for ketones, proteinuria, and bacteriuria.  Code sepsis activated.  He presented with a temperature of 101.1 but repeat temperatures have shown her to be afebrile.  He has also been tachypneic but denies any shortness of breath.  He received vancomycin, cefepime, and metronidazole upon admission.  He continues to be oriented x 3, but his baseline is alert and oriented x 4.  He lactic acid was elevated 2.0 but repeat was at 1.2.  Altered mental status likely multifactorial and secondary to UTI and  noncompliance on ESRD.  He has a history for lack of stability with the indwelling HD catheter, but this was not associated with any erythema or sensitivity on examination so less likely to be the culprit for infection. -Received vancomycin, Flagyl, and cefepime in the ED -Follow-up urine cultures  #Type II diabetes mellitus #Proteinuria with ketones #History of diabetic nephropathy Patient has a noted history of noncompliance with his medications.    His last A1c was 03/03/2022 and was 8.7.  He is receiving 150 mL/h of LR.  His bilateral hand pain is likely unrelated to the diabetic nephropathy as he denies any numbness or tingling rather describes it as his hands feeling cold.  Currently takes pregabalin but currently held. Patient has history of missed hemodialysis sessions due to troubles with transportation.  He also takes NovoLog and Lantus but notes poor compliance.  Will start SSI -Hemodialysis when appropriate -Begin SSI  #ESRD on HD Tuesday/Thursday/Saturday #History of hypertension He has not had dialysis for 2 weeks. Currently taking carvedilol and amlodipine. Amlodipine has not been dispensed since 07/20/2021.  Last scheduled dialysis was for today which she missed so patient will need hemodialysis.  Medications held at this time.  #Concern for acute sinusitis #Positive for COVID-19 Respiratory panel positive for COVID which is likely the cause for his increased cough.  This is also likely to be a contributing factor to his leukocytosis. CT head positive for air-fluid levels of the paranasal sinuses concerning for acute sinusitis.  Patient denies any sinus congestion or rhinorrhea so acute sinusitis less likely. -DuoNeb every 4 hours  #Metabolic acidosis with anion gap Bicarb decreased to 17 with anion gap of 18.  Likely secondary to nonadherence to hemodialysis and ESRD.    #Normocytic anemia Hemoglobin to 11.4.  This appears to be his baseline.  Likely secondary to  CKD.  #Thrombocytopenia Platelets decreased to 125.  Baseline between 200-240.  No concerns for bleeding at this time.  Likely secondary to CKD.  #History of HFrEF No signs of heart failure exacerbation on examination.  Last echo on 08/12/2021 demonstrated EF of 30 to 35%.  #History of hyperlipidemia  #History of PAD Current home med of rosuvastatin held at this time.  Last lipid profile on 08/19/2021 was only notable for decreased HDL of 31.  #History of tobacco use  Denies using any tobacco since July.  Home varenicline held.   Diet: Renal  VTE: Heparin IVF: LR, Code: Full  Prior to Admission Living Arrangement: Home, living cousin Anticipated Discharge Location:  TBD Barriers to Discharge: Medication compliance  Dispo: Admit patient to Inpatient with expected length of stay greater than 2 midnights.  Signed: Morrie Sheldon, MD Internal Medicine Resident PGY-1  08/24/2022, 10:09 PM

## 2022-08-24 NOTE — Progress Notes (Signed)
ED Pharmacy Antibiotic Sign Off An antibiotic consult was received from an ED provider for vancomycin and cefepime per pharmacy dosing for sepsis. A chart review was completed to assess appropriateness.   Pt is ESRD on HD  The following one time order(s) were placed:  Vancomycin 1250 mg IV x 1  Cefepime 2 g IV x 1   Further antibiotic and/or antibiotic pharmacy consults should be ordered by the admitting provider if indicated.   Thank you for allowing pharmacy to be a part of this patient's care.   Griffin Dakin, [facility]  Clinical Pharmacist 08/24/22 4:34 PM

## 2022-08-24 NOTE — ED Notes (Signed)
Family updated as to patient's status.

## 2022-08-24 NOTE — H&P (Incomplete)
Date: 08/24/2022               Patient Name:  [name] MRN: [id number]  DOB: 05-Aug-1960 Age / Sex: 62 y.o., male   PCP: Morrie Sheldon, MD         Medical Service: Internal Medicine Teaching Service         Attending Physician: Dr. Earl Lagos, MD      First Contact: Dr. Cleon Gustin, DO  [phone]    Second Contact: Dr. Modena Slater, DO  [phone]         After Hours (After 5p/  First Contact Pager: [phone]  weekends / holidays): Second Contact Pager: [phone]   SUBJECTIVE   Chief Complaint: Altered mental status   History of Present Illness: Fabrizzio Gelhar is a 62 year old male with history of ESRD on hemodialysis, type 2 diabetes mellitus, hypertension, HFrEF, hyperlipidemia, and PAD who presented to the ED with altered mental status.  Parts of the history are limited by somnolence and altered mental status of the patient.  Per the ED note, he was last seen by family 2 days ago.  He has not had his dialysis for 2 weeks and at baseline is alert and oriented x 4.  He was noted to have had a fall recently but did not hit his head.  After talking with the patient, he does not remember coming to the hospital.  His last memory was getting on the bus 2 days ago with his cousin but he cannot remember why he was getting on the bus.  He also notes a cough that is developed during this time.  Since arriving in the ED, he stated that both of his hands felt cold but were not associated with any numbness or tingling but it was harder to squeeze his hands.  He denies any chest pain, headache, congestion, rhinorrhea, or nausea or vomiting.  He also denies any dysuria, hematuria, or other urinary symptoms.  He notes that he urinates approximately 2 times per day.  He was last seen in the ED on 06/23/2022 following a fall.  He came to the ED because his wheelchair got stuck and flipped forward but he did not hit his head or lose consciousness.  He landed on his hands but x-rays were negative  for any fractures.  He was last admitted in the hospital on 05/13/2022 for diffuse pain and need for dialysis access workup.  He had presented with a low-grade temp of 100.1 and bodyaches/fatigue but no other clear infectious symptoms or exam findings.  It was noted that his indwelling HD catheter was concerning for nidus of infection due to his lack of sterility and his HD cath remained in place.  It was also noted that he has a history of missed dialysis sessions secondary to transportation.  ED Course: On admission, noted to have had a temp of 101.1, heart rate of 103, and blood pressure of 136/70.  Blood cultures were obtained.  CMP was notable for hyponatremia, hyperglycemia, and an anion gap metabolic acidosis.  His CBC was notable for leukocytosis, normocytic anemia, and thrombocytopenia.  His UA was notable for proteinuria, ketones, and bacteriuria. Urine cultures pending.  He received cefepime, Flagyl, and vancomycin.  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  Admission on 05/13/2022 noted smoking 1 pack of cigarettes every 5 days.  Family History:   Per chart review Hypertension: Brother, sister Diabetes: Mother  Allergies: Allergies as of 08/24/2022 - Review Complete 08/24/2022  Allergen Reaction Noted   Benicar [olmesartan] Cough 09/28/2013    Review of Systems: A complete ROS was negative except as per HPI.   OBJECTIVE:   Physical Exam: Blood pressure 119/61, pulse 82, temperature 97.7 F (36.5 C), temperature source Temporal, resp. rate (!) 21, SpO2 99%.  Constitutional: well-appearing, no acute distress HENT: normocephalic atraumatic, mucous membranes moist Eyes: conjunctiva non-erythematous Neck: supple Cardiovascular: regular rate and rhythm, no m/r/g Pulmonary/Chest: Tachypneic; bilateral rales at lung bases, dialysis catheter at right chest wall with no erythema Abdominal: soft, non-tender, non-distended MSK: Amputation of left above-the-knee and right below the knee Neurological: Oriented to name, place, and location but not reason for hospitalization.  Able to answer most questions Skin: warm and dry Psych: Somnolent and intermittently confused  Labs: CBC    Component Value Date/Time   WBC 14.2 (H) 08/24/2022 1603   RBC 3.84 (L) 08/24/2022 1603   HGB 11.4 (L) 08/24/2022 1603   HGB 11.1 (L) 09/22/2021 1532   HCT 34.5 (L) 08/24/2022 1603   HCT 33.9 (L) 09/22/2021 1532   PLT 125 (L) 08/24/2022 1603   PLT 243 09/22/2021 1532   MCV 89.8 08/24/2022 1603   MCV 89 09/22/2021 1532   MCH 29.7 08/24/2022 1603   MCHC 33.0 08/24/2022 1603   RDW 12.7 08/24/2022 1603   RDW 15.2 09/22/2021 1532   LYMPHSABS 0.8 08/24/2022 1603   LYMPHSABS 1.7 02/03/2021 1634   MONOABS 1.1 (H) 08/24/2022 1603   EOSABS 0.0 08/24/2022 1603   EOSABS 0.2 02/03/2021 1634   BASOSABS 0.0 08/24/2022 1603   BASOSABS 0.0 02/03/2021 1634     CMP     Component Value Date/Time   NA 131 (L) 08/24/2022 1603   NA 136 09/22/2021 1532   K 4.1 08/24/2022 1603   CL 96 (L) 08/24/2022 1603   CO2 17 (L) 08/24/2022 1603   GLUCOSE 193 (H)  08/24/2022 1603   BUN 73 (H) 08/24/2022 1603   BUN 22 09/22/2021 1532   CREATININE 7.29 (H) 08/24/2022 1603   CREATININE 0.85 05/30/2013 1100   CALCIUM 8.7 (L) 08/24/2022 1603   PROT 7.3 08/24/2022 1603   PROT 7.1 02/03/2021 1634   ALBUMIN 3.2 (L) 08/24/2022 1603   ALBUMIN 4.0 02/03/2021 1634   AST 34 08/24/2022 1603   ALT 16 08/24/2022 1603   ALKPHOS 59 08/24/2022 1603   BILITOT 0.9 08/24/2022 1603   BILITOT 0.4 02/03/2021 1634   GFRNONAA 8 (L) 08/24/2022 1603   GFRNONAA 70 04/11/2013 0825   GFRAA 33 (L) 08/01/2019 1510   GFRAA 81 04/11/2013 0825    Imaging:  DG Chest Port 1 View There is no edema, consolidation, effusion, or pneumothorax  CT Head WO Contrast  IMPRESSION: 1. No acute intracranial process. 2.  Air-fluid levels throughout the paranasal sinuses worrisome for acute sinusitis.  EKG: personally reviewed my interpretation is sinus tachycardia.  ASSESSMENT & PLAN:   Assessment & Plan by Problem: Principal Problem:   Acute metabolic encephalopathy  Yahia Gohn is a 63 year old male with history of ESRD on hemodialysis, type 2 diabetes mellitus, hypertension, HFrEF, hyperlipidemia, and PAD who presented to the ED with altered mental status and admitted for acute metabolic encephalopathy likely secondary to UTI and noncompliant hemodialysis on ESRD.  #Acute metabolic encephalopathy #UTI with concern for sepsis #Leukocytosis #Lactic Acidosis, Resolved  UA positive for ketones, proteinuria, and bacteriuria.  Code sepsis activated.  He presented with a temperature of 101.1 but repeat temperatures have shown her to be afebrile.  He has also been tachypneic but denies any shortness of breath.  He received vancomycin, cefepime, and metronidazole upon admission.  He continues to be oriented x 3, but his baseline is alert and oriented x 4.  He lactic acid was elevated 2.0 but repeat was at 1.2.  Altered mental status likely multifactorial and secondary to UTI and  noncompliance on ESRD.  He has a history for lack of stability with the indwelling HD catheter, but this was not associated with any erythema or sensitivity on examination so less likely to be the culprit for infection. -Received vancomycin, Flagyl, and cefepime in the ED -Follow-up urine cultures  #Type II diabetes mellitus #ESRD on HD Tuesday/Thursday/Saturday #Proteinuria with ketones #History of diabetic nephropathy Patient has a noted history of noncompliance with his medications.  He has not had dialysis for 2 weeks.  His last A1c was 03/03/2022 and was 8.7.  He is receiving 150 mL/h of LR.  His bilateral hand pain is likely unrelated to the diabetic nephropathy as he denies any numbness or tingling rather describes it as his hands feeling cold.  Currently takes pregabalin but currently held. Patient has history of missed hemodialysis sessions due to troubles with transportation.  Last scheduled dialysis was for today which she missed so patient will need hemodialysis.  He also takes NovoLog and Lantus but notes poor compliance.  Will start SSI -Hemodialysis when appropriate -Begin SSI  #Concern for acute sinusitis #Positive for COVID-19 Respiratory panel positive for COVID which is likely the cause for his increased cough.  This is also likely to be a contributing factor to his leukocytosis. CT head positive for air-fluid levels of the paranasal sinuses concerning for acute sinusitis.  Patient denies any sinus congestion or rhinorrhea so acute sinusitis less likely. -DuoNeb every 4 hours  #Metabolic acidosis with anion gap Bicarb decreased to 17 with anion gap of 18.  Likely secondary to nonadherence to hemodialysis and ESRD.    #Normocytic anemia Hemoglobin to 11.4.  This appears to be his baseline.  Likely secondary to CKD.  #Thrombocytopenia Platelets decreased to 125.  Baseline between 200-240.  No concerns for bleeding at this time.  Likely secondary to CKD.  #History of  hypertension Currently taking carvedilol and amlodipine.  Amlodipine has not been dispensed since 07/20/2021.  Medications held at this time.  #History of HFrEF No signs of heart failure exacerbation on examination.  Last echo on 08/12/2021 demonstrated EF of 30 to 35%.  #History of hyperlipidemia  #History of PAD Current home med of rosuvastatin held at this time.  Last lipid profile on 08/19/2021 was only notable for decreased HDL of 31.  #History of tobacco use  Denies using any tobacco since July.  Home varenicline held.   Diet: Renal  VTE: Heparin IVF: LR, Code: Full  Prior to Admission Living Arrangement: Home, living cousin Anticipated Discharge Location:  TBD Barriers to Discharge: Medication compliance  Dispo: Admit patient to Inpatient with expected length of stay greater than 2 midnights.  Signed: Morrie Sheldon, MD Internal Medicine Resident PGY-1  08/24/2022, 10:09 PM

## 2022-08-24 NOTE — Progress Notes (Signed)
[facility] is following code sepsis 
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
## 2022-08-24 NOTE — ED Provider Notes (Signed)
[name] EMERGENCY DEPARTMENT AT [facility] Provider Note   CSN: [id number] Arrival date & time: 08/24/22  1530     History  No chief complaint on file.   [name] is a 62 y.o. male.  HPI Patient presents from home.  Reportedly found on the floor.  Reportedly has not gotten dialysis in 2 weeks.  Patient is somewhat confused and cannot really provide a good history.  Reportedly did have a fall.  Head.  States throat does hurt.  States he did not hit his Past Medical History:  Diagnosis Date   ESRD on hemodialysis (HCC)    Gangrene (HCC)    right foot   GERD (gastroesophageal reflux disease)    HFrEF (heart failure with reduced ejection fraction) (HCC)    Hyperlipidemia    Hypertension    Neuromuscular disorder (HCC)    diabetic neruopathy - hands   Osteomyelitis (HCC) 2010   left foot, s/p midfoot amputation   Osteomyelitis (HCC) 09/2013   RT BKA   Osteomyelitis of ankle or foot 05/2011   rt foot, s/p 5th ray amputation   PAD (peripheral artery disease) (HCC)    Pneumonia 2010   Retroperitoneal hematoma    05/08/2021 renal biopsy complicated by retroperitoneal hematoma. 5/9 - 05/20/2021 H/H 6.9/20.9.  Imaging revealed large retroperitoneal hematoma.  S/P 2 units packed cells H/H stabilized with final values of 11.3/36.4.   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 Authorizing Provider  Accu-Chek FastClix Lancets MISC Check blood sugar 3 (three) times daily. 03/04/22   Belva Agee, MD  aspirin EC 81 MG tablet Take 1 tablet (81 mg total) by mouth daily. Swallow whole. Patient not taking: Reported on 07/02/2021 06/03/21   Medina-Vargas, Monina C, NP  blood glucose meter kit and supplies KIT Dispense based on patient and insurance preference. Use up  to four times daily as directed. (FOR ICD-9 250.00, 250.01). 04/16/15   Denton Brick, MD  Blood Glucose Monitoring Suppl (BLOOD GLUCOSE MONITOR SYSTEM) w/Device KIT Use to check blood sugar 3 (three) times daily. 06/08/21   Medina-Vargas, Monina C, NP  carvedilol (COREG) 6.25 MG tablet Take 1 tablet (6.25 mg total) by mouth 2 (two) times daily with meals for hypertension 09/10/21     Continuous Blood Gluc Sensor (FREESTYLE LIBRE 2 SENSOR) MISC Use as directed every 14 days 09/10/21   Schorr, Roma Kayser, FNP  Continuous Glucose Sensor (FREESTYLE LIBRE 2 SENSOR) MISC Place 1 sensor on the skin every 14 days. Use to check glucose continuously 06/29/22   Rocky Morel, DO  Darbepoetin Alfa (ARANESP) 100 MCG/0.5ML SOSY injection Inject 0.5 mLs (100 mcg total) into the vein every Wednesday with hemodialysis. 05/20/21   Andrey Campanile, MD  diclofenac Sodium (VOLTAREN) 1 % GEL Apply 4 g topically 4 (four) times daily. 06/29/22   Rocky Morel, DO  DULoxetine (CYMBALTA) 60 MG capsule Take 1 capsule (60 mg total) by mouth daily. 06/29/22   Rocky Morel, DO  ezetimibe (ZETIA) 10 MG tablet Take 1 tablet (10 mg total) by mouth daily. 06/29/22   Rocky Morel, DO  fluticasone [facility]) 50 MCG/ACT nasal spray Place 1 spray into both nostrils daily. Patient not taking: Reported on 08/10/2021 06/03/21   Medina-Vargas, Monina C, NP  glucose  blood (ACCU-CHEK AVIVA PLUS) test strip Check blood sugar 3 times a day 06/04/21   Medina-Vargas, Monina C, NP  insulin glargine (LANTUS) 100 UNIT/ML Solostar Pen Inject 15 Units into the skin at bedtime. 03/03/22   Katsadouros, Vasilios, MD  insulin lispro (HUMALOG) 100 UNIT/ML KwikPen Inject 5 Units into the skin 3 (three) times daily with meals. 06/28/22   Katsadouros, Vasilios, MD  Insulin Pen Needle 32G X 4 MM MISC Use to inject insulin 4 (four) times daily. 06/04/21   Medina-Vargas, Monina C, NP  melatonin (KP MELATONIN) 3 MG TABS tablet Take 1 tablet (3 mg total) by mouth at  bedtime for insomnia. 09/10/21     nicotine (NICODERM CQ - DOSED IN MG/24 HOURS) 21 mg/24hr patch Place 1 patch (21 mg total) onto the skin daily. 03/04/22   Belva Agee, MD  nicotine polacrilex (NICORETTE) 2 MG gum Take 1 each (2 mg total) by mouth as needed for smoking cessation. 03/04/22   Belva Agee, MD  pregabalin (LYRICA) 25 MG capsule Take 1 capsule by mouth once a day 06/15/22     rosuvastatin (CRESTOR) 10 MG tablet Take 1 tablet (10 mg total) by mouth daily. 11/18/21   Belva Agee, MD  traMADol (ULTRAM) 50 MG tablet Take 1 tablet (50 mg total) by mouth every 12 (twelve) hours as needed for pain 06/15/22     varenicline (CHANTIX) 0.5 MG tablet Take 1 tablet (0.5 mg total) by mouth daily. 03/03/22   Belva Agee, MD      Allergies    Benicar [olmesartan]    Review of Systems   Review of Systems  Physical Exam Updated Vital Signs BP 119/61 (BP Location: Right Arm)   Pulse 82   Temp 97.7 F (36.5 C) (Temporal)   Resp (!) 21   SpO2 99%  Physical Exam Vitals reviewed.  HENT:     Head: Atraumatic.  Eyes:     Pupils: Pupils are equal, round, and reactive to light.  Cardiovascular:     Rate and Rhythm: Regular rhythm.  Pulmonary:     Comments: Does have some rales at the bases. Abdominal:     Tenderness: There is no abdominal tenderness.  Musculoskeletal:     Comments: Left above-the-knee amputation right below the knee amputation.  Neurological:     Comments: Awakes and answers questions.  Moves extremities, however somewhat confused.   Dialysis catheter right chest wall.  No erythema however tube is not sutured to the wall.  ED Results / Procedures / Treatments   Labs (all labs ordered are listed, but only abnormal results are displayed) Labs Reviewed  RESP PANEL BY RT-PCR (RSV, FLU A&B, COVID)  RVPGX2 - Abnormal; Notable for the following components:      Result Value   SARS Coronavirus 2 by RT PCR POSITIVE (*)    All other  components within normal limits  COMPREHENSIVE METABOLIC PANEL - Abnormal; Notable for the following components:   Sodium 131 (*)    Chloride 96 (*)    CO2 17 (*)    Glucose, Bld 193 (*)    BUN 73 (*)    Creatinine, Ser 7.29 (*)    Calcium 8.7 (*)    Albumin 3.2 (*)    GFR, Estimated 8 (*)    Anion gap 18 (*)    All other components within normal limits  CBC WITH DIFFERENTIAL/PLATELET - Abnormal; Notable for the following components:   WBC 14.2 (*)    RBC 3.84 (*)  Hemoglobin 11.4 (*)    HCT 34.5 (*)    Platelets 125 (*)    Neutro Abs 12.3 (*)    Monocytes Absolute 1.1 (*)    All other components within normal limits  URINALYSIS, W/ REFLEX TO CULTURE (INFECTION SUSPECTED) - Abnormal; Notable for the following components:   APPearance HAZY (*)    Hgb urine dipstick LARGE (*)    Ketones, ur 5 (*)    Protein, ur >=300 (*)    Leukocytes,Ua SMALL (*)    Bacteria, UA MANY (*)    All other components within normal limits  I-STAT CG4 LACTIC ACID, ED - Abnormal; Notable for the following components:   Lactic Acid, Venous 2.0 (*)    All other components within normal limits  CULTURE, BLOOD (ROUTINE X 2)  CULTURE, BLOOD (ROUTINE X 2)  URINE CULTURE  PROTIME-INR  APTT  I-STAT CG4 LACTIC ACID, ED    EKG EKG Interpretation Date/Time:  Tuesday August 24 2022 15:37:54 EDT Ventricular Rate:  103 PR Interval:  131 QRS Duration:  87 QT Interval:  363 QTC Calculation: 476 R Axis:   35  Text Interpretation: Sinus tachycardia Probable left atrial enlargement No significant change since last tracing Confirmed by Benjiman Core [phone]) on 08/24/2022 3:42:04 PM  Radiology CT HEAD WO CONTRAST ( )  Result Date: 08/24/2022 CLINICAL DATA:  Head trauma EXAM: CT HEAD WITHOUT CONTRAST TECHNIQUE: Contiguous axial images were obtained from the base of the skull through the vertex without intravenous contrast. RADIATION DOSE REDUCTION: This exam was performed according to the departmental  dose-optimization program which includes automated exposure control, adjustment of the mA and/or kV according to patient size and/or use of iterative reconstruction technique. COMPARISON:  None Available. FINDINGS: Brain: No evidence of acute infarction, hemorrhage, hydrocephalus, extra-axial collection or mass lesion/mass effect. Vascular: No hyperdense vessel or unexpected calcification. Skull: Normal. Negative for fracture or focal lesion. Sinuses/Orbits: There are air-fluid levels throughout the paranasal sinuses diffusely. Mastoid air cells are clear. Orbits are within normal limits. Other: None. IMPRESSION: 1. No acute intracranial process. 2. Air-fluid levels throughout the paranasal sinuses worrisome for acute sinusitis. Electronically Signed   By: Darliss Cheney M.D.   On: 08/24/2022 18:46   DG Chest Portable 1 View  Result Date: 08/24/2022 CLINICAL DATA:  Fever and altered mental status EXAM: PORTABLE CHEST 1 VIEW COMPARISON:  None Available. FINDINGS: Artifact from EKG leads. Dialysis catheter with tip at the upper right atrium. There is no edema, consolidation, effusion, or pneumothorax. IMPRESSION: No evidence of active disease. Electronically Signed   By: Tiburcio Pea M.D.   On: 08/24/2022 17:18    Procedures Procedures    Medications Ordered in ED Medications  lactated ringers infusion ( Intravenous New Bag/Given 08/24/22 1655)  vancomycin (VANCOREADY) IVPB 1250 mg/250 mL (1,250 mg Intravenous New Bag/Given 08/24/22 1856)  acetaminophen (TYLENOL) tablet 650 mg (650 mg Oral Given 08/24/22 1645)  ceFEPIme (MAXIPIME) 2 g in sodium chloride 0.9 % 100 mL IVPB (0 g Intravenous Stopped 08/24/22 1753)  metroNIDAZOLE (FLAGYL) IVPB 500 mg (0 mg Intravenous Stopped 08/24/22 1854)    ED Course/ Medical Decision Making/ A&P                                 Medical Decision Making Amount and/or Complexity of Data Reviewed Labs: ordered. Radiology: ordered.  Risk OTC drugs. Prescription  drug management. Decision regarding hospitalization.   Patient mental status change.  Found to be febrile with rectal temperature.  No skin changes to cause a fever.  Does have harsh breath sounds but potentially volume over load.  Code sepsis activated and started on nonspecific sepsis antibiotics.  Does have urinalysis that shows UTI.  Creatinine is 7 but this is I think secondary to end-stage renal disease and not being dialyzed.  Lactic acid of 2.  Blood pressures been maintained.  Will not give large fluid boluses at this time as I believe that he is already intravascularly volume overloaded.  Will get head CT with fall.  Will require admission to hospital.  Head CT reassuring.  X-ray does not show there is severe amount of volume overload.  Lactic acid was initially elevated 2 but is improved.  Positive COVID which also could be causing the fever.  Urine does show infection does not appear to be a severe sepsis at this time.  Will require admission to the hospital.  Have called the internal medicine residents.  CRITICAL CARE Performed by: Benjiman Core Total critical care time: 30 minutes Critical care time was exclusive of separately billable procedures and treating other patients. Critical care was necessary to treat or prevent imminent or life-threatening deterioration. Critical care was time spent personally by me on the following activities: development of treatment plan with patient and/or surrogate as well as nursing, discussions with consultants, evaluation of patient's response to treatment, examination of patient, obtaining history from patient or surrogate, ordering and performing treatments and interventions, ordering and review of laboratory studies, ordering and review of radiographic studies, pulse oximetry and re-evaluation of patient's condition.         Final Clinical Impression(s) / ED Diagnoses Final diagnoses:  End stage renal disease on dialysis [facility])   Metabolic encephalopathy  Acute cystitis without hematuria  COVID    Rx / DC Orders ED Discharge Orders     None         Benjiman Core, MD 08/24/22 1948

## 2022-08-25 ENCOUNTER — Encounter (HOSPITAL_COMMUNITY): Payer: Self-pay | Admitting: Internal Medicine

## 2022-08-25 ENCOUNTER — Other Ambulatory Visit: Payer: Self-pay

## 2022-08-25 DIAGNOSIS — U071 COVID-19: Secondary | ICD-10-CM | POA: Diagnosis not present

## 2022-08-25 DIAGNOSIS — J309 Allergic rhinitis, unspecified: Secondary | ICD-10-CM | POA: Diagnosis not present

## 2022-08-25 DIAGNOSIS — E871 Hypo-osmolality and hyponatremia: Secondary | ICD-10-CM | POA: Diagnosis not present

## 2022-08-25 DIAGNOSIS — Z23 Encounter for immunization: Secondary | ICD-10-CM | POA: Diagnosis not present

## 2022-08-25 DIAGNOSIS — E785 Hyperlipidemia, unspecified: Secondary | ICD-10-CM | POA: Diagnosis not present

## 2022-08-25 DIAGNOSIS — R7881 Bacteremia: Secondary | ICD-10-CM | POA: Diagnosis not present

## 2022-08-25 DIAGNOSIS — Z89612 Acquired absence of left leg above knee: Secondary | ICD-10-CM | POA: Diagnosis not present

## 2022-08-25 DIAGNOSIS — E8889 Other specified metabolic disorders: Secondary | ICD-10-CM | POA: Diagnosis present

## 2022-08-25 DIAGNOSIS — Z4901 Encounter for fitting and adjustment of extracorporeal dialysis catheter: Secondary | ICD-10-CM | POA: Diagnosis not present

## 2022-08-25 DIAGNOSIS — G9341 Metabolic encephalopathy: Secondary | ICD-10-CM | POA: Diagnosis not present

## 2022-08-25 DIAGNOSIS — Z794 Long term (current) use of insulin: Secondary | ICD-10-CM | POA: Diagnosis not present

## 2022-08-25 DIAGNOSIS — Z95828 Presence of other vascular implants and grafts: Secondary | ICD-10-CM | POA: Diagnosis not present

## 2022-08-25 DIAGNOSIS — E8809 Other disorders of plasma-protein metabolism, not elsewhere classified: Secondary | ICD-10-CM | POA: Diagnosis not present

## 2022-08-25 DIAGNOSIS — E876 Hypokalemia: Secondary | ICD-10-CM | POA: Diagnosis not present

## 2022-08-25 DIAGNOSIS — I5022 Chronic systolic (congestive) heart failure: Secondary | ICD-10-CM | POA: Diagnosis present

## 2022-08-25 DIAGNOSIS — I5040 Unspecified combined systolic (congestive) and diastolic (congestive) heart failure: Secondary | ICD-10-CM | POA: Diagnosis not present

## 2022-08-25 DIAGNOSIS — N186 End stage renal disease: Secondary | ICD-10-CM | POA: Diagnosis not present

## 2022-08-25 DIAGNOSIS — N250 Renal osteodystrophy: Secondary | ICD-10-CM | POA: Diagnosis not present

## 2022-08-25 DIAGNOSIS — K219 Gastro-esophageal reflux disease without esophagitis: Secondary | ICD-10-CM | POA: Diagnosis not present

## 2022-08-25 DIAGNOSIS — B9689 Other specified bacterial agents as the cause of diseases classified elsewhere: Secondary | ICD-10-CM | POA: Diagnosis present

## 2022-08-25 DIAGNOSIS — R531 Weakness: Secondary | ICD-10-CM | POA: Diagnosis not present

## 2022-08-25 DIAGNOSIS — B964 Proteus (mirabilis) (morganii) as the cause of diseases classified elsewhere: Secondary | ICD-10-CM | POA: Diagnosis present

## 2022-08-25 DIAGNOSIS — N2581 Secondary hyperparathyroidism of renal origin: Secondary | ICD-10-CM | POA: Diagnosis present

## 2022-08-25 DIAGNOSIS — Z7401 Bed confinement status: Secondary | ICD-10-CM | POA: Diagnosis not present

## 2022-08-25 DIAGNOSIS — T80211A Bloodstream infection due to central venous catheter, initial encounter: Secondary | ICD-10-CM | POA: Diagnosis present

## 2022-08-25 DIAGNOSIS — Z992 Dependence on renal dialysis: Secondary | ICD-10-CM | POA: Diagnosis not present

## 2022-08-25 DIAGNOSIS — D631 Anemia in chronic kidney disease: Secondary | ICD-10-CM | POA: Diagnosis not present

## 2022-08-25 DIAGNOSIS — I739 Peripheral vascular disease, unspecified: Secondary | ICD-10-CM | POA: Diagnosis not present

## 2022-08-25 DIAGNOSIS — Z89511 Acquired absence of right leg below knee: Secondary | ICD-10-CM | POA: Diagnosis not present

## 2022-08-25 DIAGNOSIS — E1151 Type 2 diabetes mellitus with diabetic peripheral angiopathy without gangrene: Secondary | ICD-10-CM | POA: Diagnosis present

## 2022-08-25 DIAGNOSIS — E1140 Type 2 diabetes mellitus with diabetic neuropathy, unspecified: Secondary | ICD-10-CM | POA: Diagnosis not present

## 2022-08-25 DIAGNOSIS — T8571XA Infection and inflammatory reaction due to peritoneal dialysis catheter, initial encounter: Secondary | ICD-10-CM | POA: Diagnosis not present

## 2022-08-25 DIAGNOSIS — B961 Klebsiella pneumoniae [K. pneumoniae] as the cause of diseases classified elsewhere: Secondary | ICD-10-CM | POA: Diagnosis not present

## 2022-08-25 DIAGNOSIS — N390 Urinary tract infection, site not specified: Secondary | ICD-10-CM | POA: Diagnosis not present

## 2022-08-25 DIAGNOSIS — I2510 Atherosclerotic heart disease of native coronary artery without angina pectoris: Secondary | ICD-10-CM | POA: Diagnosis not present

## 2022-08-25 DIAGNOSIS — N3000 Acute cystitis without hematuria: Secondary | ICD-10-CM | POA: Diagnosis present

## 2022-08-25 DIAGNOSIS — E1122 Type 2 diabetes mellitus with diabetic chronic kidney disease: Secondary | ICD-10-CM | POA: Diagnosis present

## 2022-08-25 DIAGNOSIS — G3184 Mild cognitive impairment, so stated: Secondary | ICD-10-CM | POA: Diagnosis not present

## 2022-08-25 DIAGNOSIS — G4700 Insomnia, unspecified: Secondary | ICD-10-CM | POA: Diagnosis not present

## 2022-08-25 DIAGNOSIS — W19XXXA Unspecified fall, initial encounter: Secondary | ICD-10-CM | POA: Diagnosis present

## 2022-08-25 DIAGNOSIS — I10 Essential (primary) hypertension: Secondary | ICD-10-CM | POA: Diagnosis not present

## 2022-08-25 DIAGNOSIS — M6281 Muscle weakness (generalized): Secondary | ICD-10-CM | POA: Diagnosis not present

## 2022-08-25 DIAGNOSIS — E441 Mild protein-calorie malnutrition: Secondary | ICD-10-CM | POA: Diagnosis not present

## 2022-08-25 DIAGNOSIS — R4182 Altered mental status, unspecified: Secondary | ICD-10-CM | POA: Diagnosis present

## 2022-08-25 DIAGNOSIS — A4189 Other specified sepsis: Secondary | ICD-10-CM | POA: Diagnosis present

## 2022-08-25 DIAGNOSIS — I5020 Unspecified systolic (congestive) heart failure: Secondary | ICD-10-CM | POA: Diagnosis not present

## 2022-08-25 DIAGNOSIS — Y848 Other medical procedures as the cause of abnormal reaction of the patient, or of later complication, without mention of misadventure at the time of the procedure: Secondary | ICD-10-CM | POA: Diagnosis present

## 2022-08-25 DIAGNOSIS — E1110 Type 2 diabetes mellitus with ketoacidosis without coma: Secondary | ICD-10-CM | POA: Diagnosis present

## 2022-08-25 DIAGNOSIS — D72829 Elevated white blood cell count, unspecified: Secondary | ICD-10-CM | POA: Diagnosis not present

## 2022-08-25 DIAGNOSIS — F17210 Nicotine dependence, cigarettes, uncomplicated: Secondary | ICD-10-CM | POA: Diagnosis not present

## 2022-08-25 DIAGNOSIS — D696 Thrombocytopenia, unspecified: Secondary | ICD-10-CM | POA: Diagnosis present

## 2022-08-25 DIAGNOSIS — I132 Hypertensive heart and chronic kidney disease with heart failure and with stage 5 chronic kidney disease, or end stage renal disease: Secondary | ICD-10-CM | POA: Diagnosis not present

## 2022-08-25 LAB — COMPREHENSIVE METABOLIC PANEL
ALT: 15 U/L (ref 0–44)
AST: 31 U/L (ref 15–41)
Albumin: 2.8 g/dL — ABNORMAL LOW (ref 3.5–5.0)
Alkaline Phosphatase: 50 U/L (ref 38–126)
Anion gap: 14 (ref 5–15)
BUN: 74 mg/dL — ABNORMAL HIGH (ref 8–23)
CO2: 17 mmol/L — ABNORMAL LOW (ref 22–32)
Calcium: 7.8 mg/dL — ABNORMAL LOW (ref 8.9–10.3)
Chloride: 97 mmol/L — ABNORMAL LOW (ref 98–111)
Creatinine, Ser: 7 mg/dL — ABNORMAL HIGH (ref 0.61–1.24)
GFR, Estimated: 8 mL/min — ABNORMAL LOW (ref >60)
Glucose, Bld: 195 mg/dL — ABNORMAL HIGH (ref 70–99)
Potassium: 3.1 mmol/L — ABNORMAL LOW (ref 3.5–5.1)
Sodium: 128 mmol/L — ABNORMAL LOW (ref 135–145)
Total Bilirubin: 0.8 mg/dL (ref 0.3–1.2)
Total Protein: 6.5 g/dL (ref 6.5–8.1)

## 2022-08-25 LAB — BLOOD CULTURE ID PANEL (REFLEXED) - BCID2
A.calcoaceticus-baumannii: NOT DETECTED
Bacteroides fragilis: NOT DETECTED
CTX-M ESBL: NOT DETECTED
Candida albicans: NOT DETECTED
Candida auris: NOT DETECTED
Candida glabrata: NOT DETECTED
Candida krusei: NOT DETECTED
Candida parapsilosis: NOT DETECTED
Candida tropicalis: NOT DETECTED
Carbapenem resist OXA 48 LIKE: NOT DETECTED
Carbapenem resistance IMP: NOT DETECTED
Carbapenem resistance KPC: NOT DETECTED
Carbapenem resistance NDM: NOT DETECTED
Carbapenem resistance VIM: NOT DETECTED
Cryptococcus neoformans/gattii: NOT DETECTED
Enterobacter cloacae complex: DETECTED — AB
Enterobacterales: DETECTED — AB
Enterococcus Faecium: NOT DETECTED
Enterococcus faecalis: NOT DETECTED
Escherichia coli: NOT DETECTED
Haemophilus influenzae: NOT DETECTED
Klebsiella aerogenes: NOT DETECTED
Klebsiella oxytoca: NOT DETECTED
Klebsiella pneumoniae: NOT DETECTED
Listeria monocytogenes: NOT DETECTED
Neisseria meningitidis: NOT DETECTED
Proteus species: DETECTED — AB
Pseudomonas aeruginosa: NOT DETECTED
Salmonella species: NOT DETECTED
Serratia marcescens: NOT DETECTED
Staphylococcus aureus (BCID): NOT DETECTED
Staphylococcus epidermidis: NOT DETECTED
Staphylococcus lugdunensis: NOT DETECTED
Staphylococcus species: NOT DETECTED
Stenotrophomonas maltophilia: NOT DETECTED
Streptococcus agalactiae: NOT DETECTED
Streptococcus pneumoniae: NOT DETECTED
Streptococcus pyogenes: NOT DETECTED
Streptococcus species: NOT DETECTED

## 2022-08-25 LAB — MRSA NEXT GEN BY PCR, NASAL: MRSA by PCR Next Gen: NOT DETECTED

## 2022-08-25 LAB — PROTIME-INR
INR: 1.3 — ABNORMAL HIGH (ref 0.8–1.2)
Prothrombin Time: 15.9 s — ABNORMAL HIGH (ref 11.4–15.2)

## 2022-08-25 LAB — CBC
HCT: 29.9 % — ABNORMAL LOW (ref 39.0–52.0)
Hemoglobin: 9.8 g/dL — ABNORMAL LOW (ref 13.0–17.0)
MCH: 29.6 pg (ref 26.0–34.0)
MCHC: 32.8 g/dL (ref 30.0–36.0)
MCV: 90.3 fL (ref 80.0–100.0)
Platelets: 110 10*3/uL — ABNORMAL LOW (ref 150–400)
RBC: 3.31 MIL/uL — ABNORMAL LOW (ref 4.22–5.81)
RDW: 12.7 % (ref 11.5–15.5)
WBC: 12 10*3/uL — ABNORMAL HIGH (ref 4.0–10.5)
nRBC: 0 % (ref 0.0–0.2)

## 2022-08-25 LAB — GLUCOSE, CAPILLARY
Glucose-Capillary: 136 mg/dL — ABNORMAL HIGH (ref 70–99)
Glucose-Capillary: 147 mg/dL — ABNORMAL HIGH (ref 70–99)
Glucose-Capillary: 148 mg/dL — ABNORMAL HIGH (ref 70–99)
Glucose-Capillary: 149 mg/dL — ABNORMAL HIGH (ref 70–99)
Glucose-Capillary: 243 mg/dL — ABNORMAL HIGH (ref 70–99)

## 2022-08-25 LAB — HEPATITIS B SURFACE ANTIGEN: Hepatitis B Surface Ag: NONREACTIVE

## 2022-08-25 LAB — APTT: aPTT: 40 s — ABNORMAL HIGH (ref 24–36)

## 2022-08-25 MED ORDER — PREGABALIN 25 MG PO CAPS
50.0000 mg | ORAL_CAPSULE | Freq: Every day | ORAL | Status: DC
  Administered 2022-08-26 – 2022-09-02 (×8): 50 mg via ORAL
  Filled 2022-08-25 (×8): qty 2

## 2022-08-25 MED ORDER — POTASSIUM CHLORIDE CRYS ER 20 MEQ PO TBCR
40.0000 meq | EXTENDED_RELEASE_TABLET | Freq: Once | ORAL | Status: AC
  Administered 2022-08-25: 40 meq via ORAL
  Filled 2022-08-25: qty 2

## 2022-08-25 MED ORDER — HEPARIN SODIUM (PORCINE) 1000 UNIT/ML IJ SOLN
1000.0000 [IU] | INTRAMUSCULAR | Status: DC | PRN
  Administered 2022-08-25: 1000 [IU]
  Administered 2022-08-31: 3200 [IU]
  Filled 2022-08-25 (×11): qty 1

## 2022-08-25 MED ORDER — SODIUM CHLORIDE 0.9 % IV SOLN
1.0000 g | INTRAVENOUS | Status: DC
  Administered 2022-08-25: 1 g via INTRAVENOUS
  Filled 2022-08-25 (×2): qty 10

## 2022-08-25 MED ORDER — IPRATROPIUM-ALBUTEROL 0.5-2.5 (3) MG/3ML IN SOLN: 3.0000 mL | RESPIRATORY_TRACT | Status: DC | PRN

## 2022-08-25 MED ORDER — LIDOCAINE 4 % EX CREA
TOPICAL_CREAM | Freq: Once | CUTANEOUS | Status: AC
  Administered 2022-08-25: 1 via TOPICAL
  Filled 2022-08-25: qty 5

## 2022-08-25 MED ORDER — ROSUVASTATIN CALCIUM 5 MG PO TABS
10.0000 mg | ORAL_TABLET | Freq: Every day | ORAL | Status: DC
  Administered 2022-08-25 – 2022-09-02 (×9): 10 mg via ORAL
  Filled 2022-08-25 (×9): qty 2

## 2022-08-25 MED ORDER — NIRMATRELVIR/RITONAVIR (PAXLOVID) TABLET (RENAL DOSING)
2.0000 | ORAL_TABLET | Freq: Every day | ORAL | Status: DC
  Filled 2022-08-25: qty 20

## 2022-08-25 MED ORDER — NIRMATRELVIR/RITONAVIR (PAXLOVID) TABLET (RENAL DOSING)
2.0000 | ORAL_TABLET | Freq: Two times a day (BID) | ORAL | Status: DC
  Filled 2022-08-25: qty 20

## 2022-08-25 MED ORDER — ORAL CARE MOUTH RINSE: 15.0000 mL | OROMUCOSAL | Status: DC | PRN

## 2022-08-25 MED ORDER — CARVEDILOL 6.25 MG PO TABS
6.2500 mg | ORAL_TABLET | Freq: Two times a day (BID) | ORAL | Status: DC
  Administered 2022-08-25 – 2022-08-26 (×2): 6.25 mg via ORAL
  Filled 2022-08-25 (×2): qty 1

## 2022-08-25 MED ORDER — NICOTINE 14 MG/24HR TD PT24
14.0000 mg | MEDICATED_PATCH | Freq: Every day | TRANSDERMAL | Status: DC
  Administered 2022-08-25 – 2022-09-02 (×9): 14 mg via TRANSDERMAL
  Filled 2022-08-25 (×9): qty 1

## 2022-08-25 MED ORDER — ACETAMINOPHEN 325 MG PO TABS
800.0000 mg | ORAL_TABLET | Freq: Four times a day (QID) | ORAL | Status: DC
  Administered 2022-08-25 – 2022-09-02 (×26): 812.5 mg via ORAL
  Filled 2022-08-25 (×27): qty 3

## 2022-08-25 MED ORDER — CHLORHEXIDINE GLUCONATE CLOTH 2 % EX PADS
6.0000 | MEDICATED_PAD | Freq: Every day | CUTANEOUS | Status: DC
  Administered 2022-08-26 – 2022-08-31 (×6): 6 via TOPICAL

## 2022-08-25 MED ORDER — CHLORHEXIDINE GLUCONATE CLOTH 2 % EX PADS
6.0000 | MEDICATED_PAD | Freq: Every day | CUTANEOUS | Status: DC
  Administered 2022-08-25 – 2022-08-28 (×3): 6 via TOPICAL

## 2022-08-25 MED ORDER — INSULIN GLARGINE-YFGN 100 UNIT/ML ~~LOC~~ SOLN
15.0000 [IU] | Freq: Every day | SUBCUTANEOUS | Status: DC
  Administered 2022-08-26 – 2022-09-01 (×8): 15 [IU] via SUBCUTANEOUS
  Filled 2022-08-25 (×9): qty 0.15

## 2022-08-25 MED ORDER — PREGABALIN 25 MG PO CAPS
25.0000 mg | ORAL_CAPSULE | Freq: Every day | ORAL | Status: DC
  Administered 2022-08-25: 25 mg via ORAL
  Filled 2022-08-25: qty 1

## 2022-08-25 NOTE — Progress Notes (Signed)
Pt receives out-pt HD at [facility] on TTS. Will follow and assist.   Olivia Canter Renal Navigator [phone]

## 2022-08-25 NOTE — Progress Notes (Signed)
New Admission Note:  Arrival Method: Stretcher Mental Orientation: Alert and oriented x 4 Telemetry: Box 20 NSL Assessment: Completed Skin: B BKA. Warm and dry IV: NSLs Pain: Denies Tubes: N/A Safety Measures: Safety Fall Prevention Plan initiated.  Admission: Completed 5 M  Orientation: Patient has been orientated to the room, unit and the staff. Welcome booklet given.  Family: None  Orders have been reviewed and implemented. Will continue to monitor the patient. Call light has been placed within reach and bed alarm has been activated.   Guilford Shi BSN, RN  Phone Number: [phone]

## 2022-08-25 NOTE — Plan of Care (Signed)
  Problem: Education: Goal: Knowledge of risk factors and measures for prevention of condition will improve Outcome: Completed/Met   

## 2022-08-25 NOTE — Hospital Course (Addendum)
#Gram Negative Bacteremia, Enterobacter cloacae, Proteus Mirabilis, and Serratia Fonticola  (2/2) HD catheter infection #Altered mental status, resolved #Leukocytosis resolved #Lactic Acidosis - Resolved  Presented with retrograde memory loss upon presentation, last memory was getting on the bus two days ago with his cousin. Initial presentation in ED was temp 101.1 and tachycardiac. He was oriented to self, day, and place  UA positive for many bacteria, proteinuria and large Hgb. LA 2.0, WBC 14.2, COVID positive. Code sepsis activated in ED, pt was given one dose vancomycin, Cefepime, and flagyl in ED. CXR negative for active disease. Blood cultures 8/20 grew serratia fonticola, enterobacter cloacae, and proteus mirabilis with adequate sensitivities. Bacteremia secondary to dialysis catheter. Nephrology on board. Patient had HD catheter removed 8/22 by IR. Repeat blood cultures on 8/22 no growth. Patient subsequently had an HD catheter placement by IR on 8/26, without complication. Patient is planned to receive last dose PO Levaquin today.    #Klebsiella positive UTI  Presented with UA positive for large Hgb, ketones, protein>300, small leukocytes, RBC/WBC >50, and many bacteria. Urine culture positive for Klebsiella. Pt denies any dysuria or urinary symptoms. Adequate sensitivities. Patient is getting treated with PO Levaquin for above, which will also cover for his UTI.     #ESRD on HD, TTS #Hypokalemia - resolved  #Hyponatremia- improving #Anemia of Chronic Disease #Thrombocytopenia - resolved    #Hypotension- resolved  Patient presented with electrolyte abnormalities. Initially presented with potassium of 4.1 then decreased to 3.1, platelets of 125, and sodium of 128. Patient was unable to tolerate dialysis 8/21, ended the session after 1 hour due to rigors. Treatment duration was only 52 minutes. Last session prior to that was on Saturday, which he was also unable to complete due to chest pain.  Per nephrology, he only had 2 dialysis sessions this month of August. HD catheter removed 8/22; replaced on 8/26. Dialysis session 8/26 with net fluid removal 1500 mL, tolerated well. Patient was given IV Aranesp 8/24, Hgb today 10.2, PlT 156.    #COVID-19 infection Tested positive for COVID-19 in the ED on 8/20. Ordered duonebs PRN for SOB/wheezing. Patient remains afebrile and denies any respiratory symptoms. Pt is out of quarantine window. Masks only for the next 5 days.    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 Patient reports numbness and pain of his bilateral hands, mostly on the fingertips, postive for muscle wasting. Patient home insulin consist of Novolog 5 units TID and Lantus 15 units at bedtime. Pt was started on Semglee 15 units at bedtime with sliding scale insulin. Pt was also started on Pregabalin 50 mg daily and Cymbalta 60 mg daily for neuropathy of hands.     #History of HFrEF No signs of heart failure exacerbation on examination. Patient denies any shortness of breath or cough as this time. No rales or wheezing on lung exam. Last echo on 08/12/2021 demonstrated EF of 30 to 35%. GDMT limited to ESRD. Home medication includes Coreg 6.125 BID. Continued Coreg 6.125 BID as hypotension has resolved.    #History of hyperlipidemia  #History of PAD Continued Rosuvastatin 10 mg daily.     #History of tobacco use  Home medication includes Varenicline 0.5 mg daily. Denies using any tobacco since July, however, upon speaking with Sister, notes that he is a heavy smoker and was trying to  leave ED to smoke outside. Nicotine transdermal patch.    --------------------------------------------------------------------------------   08/25: He states that his hands are getting frozen. He states that they are  worse from yesterday. He notes that when his hands getoff the heating pads, they are getting more cold. Explained to patient that his circulation is not that well and that is the likely reasoning. He denies any chest pain or shortness of breath. He does feel little shocks in his leg stumps. He denies any urinary changes. He otherwise is doing well with his only concern being his hands being cold. He states he is just having a rough time with the hands.   --------------------------------------------------------------------------------------------- Patient instructions,  [name], [name] came to the hospital for confusion, fever, and infection of your blood.  We treated you with fluids and antibiotics.  We took out your hemodialysis catheter which was likely the source of your infection in your blood.  We replaced your hemodialysis catheter with a new catheter on 26th of August. You received two dialysis sessions here in the hospital.  For your ESRD on hemodialysis:  Please continue to go to your hemodialysis sessions on the respective days: -Tuesdays, Thursdays, Saturday -Please keep the hemodialysis catheter site clean  For your type 2 diabetes and neuropathy of your hands Please continue to take your home medications as prescribed -Duloxetine 60 mg, take 1 capsule by mouth daily -Pregabalin 25 mg, take 1 capsule by mouth daily -Insulin lispro, inject 5 units into the skin 3 times daily with meals -Please continue to check your blood sugars at home -Tramadol 50 mg, take 1 tablet by mouth every 12 hours as needed for pain -Diclofenac sodium gel, apply as needed for pain  For your high cholesterol, peripheral artery disease Please continue to take your home medication -Rosuvastatin 10 mg, take 1 tablet by mouth daily -Ezetimibe 10 mg, take 1 tablet by mouth daily  For your tobacco use Please continue to take your home medications as prescribed -Varenicline 0.5 mg, 1 tablet by mouth daily  for decrease tobacco cravings -Nicotine patch 21 mg, 1 patch onto the skin every 24 hours  For your heart failure Please continue to take your home medication as prescribed -Coreg 6.25 mg, take 1 tablet by mouth 2 times daily with meals for high blood pressure  If you have any of these following symptoms, please call us or seek care at an emergency department: -Chest Pain -Difficulty Breathing -Worsening abdominal pain -Syncope (passing out) -Drooping of face -Slurred speech -Sudden weakness in your leg or arm -Fever -Chills -blood in the stool -dark black, sticky stool  I am glad you are feeling better. It was a pleasure taking care for you. I wish a good recovery and good health!   Jeral Pinch, DO, PGY 1  ------------------------------------------------------------------------------------------------------------------  Disposition  Gram Negative Bacteremia, Enterobacter cloacae, Proteus Mirabilis, and Serratia Fonticola (2/2) HD catheter infection. Altered mental status.  Repeat blood cultures were negative for any growth in the past 48 hours.  Patient is currently nonfebrile.  No leukocytosis.  Patient is alert and oriented x 4.  Hemodialysis catheter was removed on 22nd and replaced on 26th.  Please continue to encourage proper hygiene and handling of hemodialysis catheter.   COVID-19: Patient tested positive on August 20th, patient is out of the 5-day quarantine window.  Patient is encouraged to wear masks for the next 5 days, last day August 30.  Patient denies any respiratory symptoms. Klebsiella positive UTI: Patient was treated  with p.o. Levaquin inpatient.  Patient has completed the course of antibiotics inpatient.  Patient denies any urinary symptoms.  Please repeat UA. ESRD on hemodialysis Tuesdays, Thursday, Saturday: After the placement of the new hemodialysis catheter on August 26, patient received 2 dialysis sessions inpatient.  Please recheck sodium, potassium,  platelets. Anemia of chronic disease: Patient was given IV Aranesp 8/24, hemoglobin stable at 10.2, platelet 156.  Please continue to check hemoglobin at the clinic. Peripheral neuropathy: Patient is discharged on home medications pregabalin 25 mg daily and duloxetine 60 mg daily.  Please titrate medications as needed to control numbness and pain of his bilateral hands.  Please keep in mind that this patient is ESRD as you titrate pregabalin or duloxetine. Tobacco use: Patient is discharged on home medications Chantix 0.5 mg daily to for decreased tobacco cravings and added nicotine patch every 24 hours. Heart failure: Patient is discharged with home medication Coreg 6.25 mg 1 tablet twice daily.  Please monitor blood pressures in office.   [facility] Set 13th at 10:15, Dr. Rayvon Char  .
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
## 2022-08-25 NOTE — Progress Notes (Signed)
PHARMACY - PHYSICIAN COMMUNICATION CRITICAL VALUE ALERT - BLOOD CULTURE IDENTIFICATION (BCID)  Assessment:  [name] is an 62 y.o. male who presented to [facility] on 08/24/2022 with a chief complaint of sepsis. Suspected source is urinary, with UA WBC>50, no squamous cells- should reflex to culture. 8/20 blood culture with 3/4 GNRs- Enterobacter cloacae and Proteus spp. identified on BCID.   Name of physician (or Provider) Contacted: Earl Lagos, MD   Current antibiotics: none- received Cefepime/MTZ/Vancomycin in the ED   Changes to prescribed antibiotics recommended:  START cefepime 1g IV Q24H- on HD TTS as an outpatient, will follow-up to schedule cefepime with HD   Recommendations accepted by provider  Results for orders placed or performed during the hospital encounter of 08/24/22  Blood Culture ID Panel (Reflexed) (Collected: 08/24/2022  3:54 PM)  Result Value Ref Range   Enterococcus faecalis NOT DETECTED NOT DETECTED   Enterococcus Faecium NOT DETECTED NOT DETECTED   Listeria monocytogenes NOT DETECTED NOT DETECTED   Staphylococcus species NOT DETECTED NOT DETECTED   Staphylococcus aureus (BCID) NOT DETECTED NOT DETECTED   Staphylococcus epidermidis NOT DETECTED NOT DETECTED   Staphylococcus lugdunensis NOT DETECTED NOT DETECTED   Streptococcus species NOT DETECTED NOT DETECTED   Streptococcus agalactiae NOT DETECTED NOT DETECTED   Streptococcus pneumoniae NOT DETECTED NOT DETECTED   Streptococcus pyogenes NOT DETECTED NOT DETECTED   A.calcoaceticus-baumannii NOT DETECTED NOT DETECTED   Bacteroides fragilis NOT DETECTED NOT DETECTED   Enterobacterales DETECTED (A) NOT DETECTED   Enterobacter cloacae complex DETECTED (A) NOT DETECTED   Escherichia coli NOT DETECTED NOT DETECTED   Klebsiella aerogenes NOT DETECTED NOT DETECTED   Klebsiella oxytoca NOT DETECTED NOT DETECTED   Klebsiella pneumoniae NOT DETECTED NOT DETECTED   Proteus species DETECTED (A) NOT DETECTED    Salmonella species NOT DETECTED NOT DETECTED   Serratia marcescens NOT DETECTED NOT DETECTED   Haemophilus influenzae NOT DETECTED NOT DETECTED   Neisseria meningitidis NOT DETECTED NOT DETECTED   Pseudomonas aeruginosa NOT DETECTED NOT DETECTED   Stenotrophomonas maltophilia NOT DETECTED NOT DETECTED   Candida albicans NOT DETECTED NOT DETECTED   Candida auris NOT DETECTED NOT DETECTED   Candida glabrata NOT DETECTED NOT DETECTED   Candida krusei NOT DETECTED NOT DETECTED   Candida parapsilosis NOT DETECTED NOT DETECTED   Candida tropicalis NOT DETECTED NOT DETECTED   Cryptococcus neoformans/gattii NOT DETECTED NOT DETECTED   CTX-M ESBL NOT DETECTED NOT DETECTED   Carbapenem resistance IMP NOT DETECTED NOT DETECTED   Carbapenem resistance KPC NOT DETECTED NOT DETECTED   Carbapenem resistance NDM NOT DETECTED NOT DETECTED   Carbapenem resist OXA 48 LIKE NOT DETECTED NOT DETECTED   Carbapenem resistance VIM NOT DETECTED NOT DETECTED    Jani Gravel, PharmD Clinical Pharmacist  08/25/2022 8:28 AM

## 2022-08-25 NOTE — Progress Notes (Addendum)
HD#0 SUBJECTIVE:  Patient Summary: [name] is a 62 y.o. with past medical history of ESRD on hemodialysis, Tuesdays, Thursdays, Saturday, type 2 diabetes, hypertension, hyperlipidemia, PAD, HFrEF presented to ED with altered mental status at and admitted for acute metabolic encephalopathy.  Overnight Events: None  Interim History: Patient evaluated at bedside, laying comfortably.  He does not have any concerns this morning.  Patient denies any cough, chest pain, shortness of breath. Patient is alert and oriented to name, location, year, event. Patient reports his relative brought him to ED because he was confused at home.  Patient reports numbness of his hands. Patient denies any urinary symptoms, denies dysuria. Patient denies any changes in his stools.  Patient reports he wants his wheelchair.  OBJECTIVE:  Vital Signs: Vitals:   08/25/22 0045 08/25/22 0047 08/25/22 0518 08/25/22 0750  BP:  (!) 120/58 (!) 144/67 (!) 151/81  Pulse:  92 84 90  Resp:  19 19   Temp:  98.2 F (36.8 C) 98.8 F (37.1 C) 98.2 F (36.8 C)  TempSrc:  Oral Oral Oral  SpO2:  97% 100% 97%  Weight: 66 kg     Height: 5\' 9"  (1.753 m)      Supplemental O2: Room Air SpO2: 97 %  Filed Weights   08/25/22 0045  Weight: 66 kg     Intake/Output Summary (Last 24 hours) at 08/25/2022 1003 Last data filed at 08/25/2022 8413 Gross per 24 hour  Intake 1569.77 ml  Output 750 ml  Net 819.77 ml   Net IO Since Admission: 819.77 mL [08/25/22 1003]  Physical Exam: Physical Exam  Constitutional: well-appearing, laying comfortably, no acute distress HENT: normocephalic atraumatic, mucous membranes moist Cardiovascular: regular rate and rhythm, no m/r/g Pulmonary/Chest: mild decrease breath sounds bibasilar lung fields, dialysis catheter at right chest wall with no erythema or TTP. Abdominal: soft, non-tender, non-distended, bowel sounds present MSK: Right below knee amputation, left above-knee  amputation Neurological: Alert and oriented to name, date, location, year, event. +Numbness of the distal hands (fingertips) L>R.  Skin: warm and dry Psych: Normal mood and Affect   Patient Lines/Drains/Airways Status     Active Line/Drains/Airways     Name Placement date Placement time Site Days   Peripheral IV 08/24/22 20 G Anterior;Left;Proximal Forearm 08/24/22  1605  Forearm  1   Peripheral IV 08/24/22 18 G Proximal;Right;Posterior Forearm 08/24/22  --  Forearm  1   Hemodialysis Catheter Right Internal jugular Double lumen Permanent (Tunneled) 05/13/21  1657  Internal jugular  469             ASSESSMENT/PLAN:  Assessment: Principal Problem:   Acute metabolic encephalopathy   Plan: #Bacteremia  #Altered Mental Status, back to baseline  #Sepsis 2/2 COVID 19 Infection #Leukocytosis - improving  #Lactic Acidosis - resolved.  Presented with retrograde memory loss upon presentation, last memory was getting on the bus two days ago with his cousin. Initial presentation in ED was temp 101.1 and tachycardiac. He was oriented to self, day, and place. UA positive for large Hgb, ketones, protein>300, small leukocytes, RBC/WBC >50, and many bacteria. Urine culture pending. Pt denies any dysuria or urinary symptoms. LA 2.0, WBC 14.2 Code sepsis activated in ED, pt was given one dose vancomycin, Cefepime, and flagyl in ED. CXR negative for active disease. AMS likely in the setting of electrolyte disturbances as listed below, suspected UTI, and COVID 19 infection. Blood cultures showed gram negative rods, positive for enterobacter cloacae complex and proteus species. Sensitivities pending.  Currently, patient is alert and oriented to name, DOB, location, and event. He denies any urinary symptoms. Patient is afebrile. Denies any cough or congestion. WBC 12.0 (14.2), trending downwards. - IV Cefepime 1g  - STOP Paxlovid  - DuoNebs prn for SOB or wheezing - Follow up on sensitivities         Suspected UTI Presented with UA positive for large Hgb, ketones, protein>300, small leukocytes, RBC/WBC >50, and many bacteria. Urine culture pending. Pt denies any dysuria or urinary symptoms.  - Follow up on urine culture   #ESRD on HD Tuesday/Thursday/Saturday  Patient's last session was on Saturday where he was unable to complete it due to chest pain. He missed his session on Tuesday 8/20. Has a history of missing dialysis sessions intermittently. Patient appears euvolemic on exam. Nephrology is consulted for dialysis session here inpatient.  - HD T/TH/Sat  #Type II diabetes mellitus - insulin dependent  #Proteinuria with ketones #History of diabetic nephropathy #R BKA #L AKA Last A1c was 8.7 on 03/03/2022, history of non compliance with medications. Patient reports numbness of his bilateral hands, mostly on the fingertips L>R. FSBG AM 243. Patient home insulin consist of Novolog 5 units TID and Lantus 15 units at bedtime.  - START on Semglee 15 units at bedtime with sliding scale insulin  - Continue CBG checks - START home medication Pregabalin 25 mg daily   #Metabolic acidosis with anion gap Presented with Bicarb decreased to 17 with anion gap of 18, likely in the acute setting of infection. AG 14, Bicarb 17. Improving.        #Hypokalemia Initially presented with K 4.1, then decreased to 3.1. Given 40 mEq of K CL. Pending HD today.   #Normocytic anemia Initial Hemoglobin 11.4, baseline around 11-12. Currently Hgb 9.8. No acute signs and symptoms of bleeding.  Per chart review, Mircera 30 mcg IV q 4 weeks. Will continue to monitor Hgb.    #Thrombocytopenia Initial Platelets 125.  Baseline between 200-240. Currently PLT 110. No concerns for bleeding at this time. Will continue to monitor PLT.    #Hyponatremia Calculated serum osmolality 299 upon presentation. Patient noted to be hyponatremic during previous admissions with a baseline sodium around 130-133. Currently sodium is  at 128. Pt received fluids in ED. Patient with ESRD, waiting on HD today. Will continue to trend Na.         #History of HFrEF No signs of heart failure exacerbation on examination. Patient denies any shortness of breath or cough as this time. No rales or wheezing on lung exam. Last echo on 08/12/2021 demonstrated EF of 30 to 35%. GDMT limited to ESRD. Home medication includes Coreg 6.125 BID.  - Coreg 6.125 BID    #History of hyperlipidemia  #History of PAD - Continue Rosuvastatin 10 mg daily    #History of tobacco use  Home medication includes Varenicline 0.5 mg daily. Denies using any tobacco since July, however, upon speaking with Sister, notes that he is a heavy smoker and was trying to leave ED yesterday to smoke outside.  - Nicotine transdermal patch   Best Practice: Diet: Renal diet IVF: Fluids: None VTE: heparin injection 5,000 Units Start: 08/24/22 2200 Code: Full AB: Cefepime 1g  Therapy Recs: Pending, DME: wheelchair Family Contact: Sister, [name],, called and notified. DISPO: Anticipated discharge  1-2 days  to Home pending  medical management .  Signature: Jeral Pinch, D.O.  Internal Medicine Resident, PGY-1 [facility]  Pager: [phone] 10:03 AM, 08/25/2022  Please contact the on call pager after 5 pm and on weekends at [phone].

## 2022-08-25 NOTE — Plan of Care (Signed)
  Problem: Education: Goal: Ability to describe self-care measures that may prevent or decrease complications (Diabetes Survival Skills Education) will improve Outcome: Completed/Met Goal: Individualized Educational Video(s) Outcome: Not Applicable   

## 2022-08-26 ENCOUNTER — Inpatient Hospital Stay (HOSPITAL_COMMUNITY): Payer: 59

## 2022-08-26 DIAGNOSIS — R7881 Bacteremia: Secondary | ICD-10-CM | POA: Diagnosis not present

## 2022-08-26 DIAGNOSIS — U071 COVID-19: Secondary | ICD-10-CM | POA: Diagnosis not present

## 2022-08-26 DIAGNOSIS — D72829 Elevated white blood cell count, unspecified: Secondary | ICD-10-CM | POA: Diagnosis not present

## 2022-08-26 HISTORY — PX: IR REMOVAL TUN CV CATH W/O FL: IMG2289

## 2022-08-26 LAB — BASIC METABOLIC PANEL
Anion gap: 17 — ABNORMAL HIGH (ref 5–15)
BUN: 51 mg/dL — ABNORMAL HIGH (ref 8–23)
CO2: 16 mmol/L — ABNORMAL LOW (ref 22–32)
Calcium: 8 mg/dL — ABNORMAL LOW (ref 8.9–10.3)
Chloride: 97 mmol/L — ABNORMAL LOW (ref 98–111)
Creatinine, Ser: 5.41 mg/dL — ABNORMAL HIGH (ref 0.61–1.24)
GFR, Estimated: 11 mL/min — ABNORMAL LOW (ref >60)
Glucose, Bld: 132 mg/dL — ABNORMAL HIGH (ref 70–99)
Potassium: 3.2 mmol/L — ABNORMAL LOW (ref 3.5–5.1)
Sodium: 130 mmol/L — ABNORMAL LOW (ref 135–145)

## 2022-08-26 LAB — CBC
HCT: 32.4 % — ABNORMAL LOW (ref 39.0–52.0)
Hemoglobin: 10.7 g/dL — ABNORMAL LOW (ref 13.0–17.0)
MCH: 30 pg (ref 26.0–34.0)
MCHC: 33 g/dL (ref 30.0–36.0)
MCV: 90.8 fL (ref 80.0–100.0)
Platelets: 66 10*3/uL — ABNORMAL LOW (ref 150–400)
RBC: 3.57 MIL/uL — ABNORMAL LOW (ref 4.22–5.81)
RDW: 12.5 % (ref 11.5–15.5)
WBC: 12.9 10*3/uL — ABNORMAL HIGH (ref 4.0–10.5)
nRBC: 0 % (ref 0.0–0.2)

## 2022-08-26 LAB — GLUCOSE, CAPILLARY
Glucose-Capillary: 146 mg/dL — ABNORMAL HIGH (ref 70–99)
Glucose-Capillary: 151 mg/dL — ABNORMAL HIGH (ref 70–99)
Glucose-Capillary: 164 mg/dL — ABNORMAL HIGH (ref 70–99)
Glucose-Capillary: 125 mg/dL — ABNORMAL HIGH (ref 70–99)

## 2022-08-26 LAB — PHOSPHORUS: Phosphorus: <1 mg/dL — CL (ref 2.5–4.6)

## 2022-08-26 LAB — HEPATITIS B SURFACE ANTIBODY, QUANTITATIVE: Hep B S AB Quant (Post): 2003 m[IU]/mL

## 2022-08-26 MED ORDER — SODIUM CHLORIDE 0.9 % IV BOLUS
500.0000 mL | Freq: Once | INTRAVENOUS | Status: AC
  Administered 2022-08-26: 500 mL via INTRAVENOUS

## 2022-08-26 MED ORDER — POTASSIUM CHLORIDE CRYS ER 20 MEQ PO TBCR
40.0000 meq | EXTENDED_RELEASE_TABLET | Freq: Once | ORAL | Status: AC
  Administered 2022-08-26: 40 meq via ORAL
  Filled 2022-08-26: qty 2

## 2022-08-26 MED ORDER — LIDOCAINE HCL 1 % IJ SOLN
INTRAMUSCULAR | Status: AC
  Filled 2022-08-26: qty 20

## 2022-08-26 MED ORDER — PROSOURCE PLUS PO LIQD
30.0000 mL | Freq: Two times a day (BID) | ORAL | Status: DC
  Administered 2022-08-26 – 2022-09-02 (×13): 30 mL via ORAL
  Filled 2022-08-26 (×11): qty 30

## 2022-08-26 MED ORDER — LIDOCAINE HCL 1 % IJ SOLN
10.0000 mL | Freq: Once | INTRAMUSCULAR | Status: AC
  Administered 2022-08-26: 10 mL via INTRADERMAL

## 2022-08-26 MED ORDER — SODIUM CHLORIDE 0.9 % IV SOLN
1.0000 g | Freq: Once | INTRAVENOUS | Status: AC
  Administered 2022-08-26: 1 g via INTRAVENOUS
  Filled 2022-08-26: qty 10

## 2022-08-26 MED ORDER — POTASSIUM PHOSPHATES 15 MMOLE/5ML IV SOLN
30.0000 mmol | Freq: Once | INTRAVENOUS | Status: AC
  Administered 2022-08-26: 30 mmol via INTRAVENOUS
  Filled 2022-08-26: qty 10

## 2022-08-26 MED ORDER — DULOXETINE HCL 60 MG PO CPEP
60.0000 mg | ORAL_CAPSULE | Freq: Every day | ORAL | Status: DC
  Administered 2022-08-26 – 2022-09-02 (×8): 60 mg via ORAL
  Filled 2022-08-26 (×9): qty 1

## 2022-08-26 MED ORDER — SODIUM BICARBONATE 650 MG PO TABS
1300.0000 mg | ORAL_TABLET | Freq: Two times a day (BID) | ORAL | Status: DC
  Administered 2022-08-26 – 2022-08-31 (×11): 1300 mg via ORAL
  Filled 2022-08-26 (×11): qty 2

## 2022-08-26 MED ORDER — SODIUM CHLORIDE 0.9 % IV SOLN
1.0000 g | INTRAVENOUS | Status: DC
  Filled 2022-08-26: qty 10

## 2022-08-26 NOTE — Progress Notes (Signed)
[facility] Progress Note   Subjective:   Seen in room - very lethargic. + COVID and GNR bacteremia. Dialysis attempted last night - began having severe rigors so HD ended early - only got about 1hr. He misses HD often, but has a little residual kidney function. IR consulted, TDC removed this AM - no line at this time.  Objective Vitals:   08/26/22 0640 08/26/22 0716 08/26/22 1000 08/26/22 1100  BP: (!) 104/53 (!) 99/47    Pulse: 65 97    Resp: 19 18    Temp: 98.4 F (36.9 C) 98.4 F (36.9 C)    TempSrc:      SpO2: (!) 80% 99% 100% 100%  Weight:      Height:       Physical Exam General: Ill appearing man, room air, lethargic. Heart: RRR; no murmur Lungs: CTAB, poor inspiratory effort Abdomen: soft Extremities: L AKA, R BKA - no stump edema Dialysis Access: none at this time.  Additional Objective Labs: Basic Metabolic Panel: Recent Labs  Lab 08/24/22 1603 08/25/22 0140 08/26/22 0422  NA 131* 128* 130*  K 4.1 3.1* 3.2*  CL 96* 97* 97*  CO2 17* 17* 16*  GLUCOSE 193* 195* 132*  BUN 73* 74* 51*  CREATININE 7.29* 7.00* 5.41*  CALCIUM 8.7* 7.8* 8.0*   Liver Function Tests: Recent Labs  Lab 08/24/22 1603 08/25/22 0140  AST 34 31  ALT 16 15  ALKPHOS 59 50  BILITOT 0.9 0.8  PROT 7.3 6.5  ALBUMIN 3.2* 2.8*   CBC: Recent Labs  Lab 08/24/22 1603 08/25/22 0140 08/26/22 0422  WBC 14.2* 12.0* 12.9*  NEUTROABS 12.3*  --   --   HGB 11.4* 9.8* 10.7*  HCT 34.5* 29.9* 32.4*  MCV 89.8 90.3 90.8  PLT 125* 110* 66*   Blood Culture    Component Value Date/Time   SDES URINE, RANDOM 08/24/2022 1625   SPECREQUEST NONE Reflexed from [id number] 08/24/2022 1625   CULT (A) 08/24/2022 1625    >=100,000 COLONIES/mL GRAM NEGATIVE RODS IDENTIFICATION AND SUSCEPTIBILITIES TO FOLLOW Performed at [facility], [address] [place], [place] [address]    REPTSTATUS PENDING 08/24/2022 1625   Studies/Results: CT HEAD WO CONTRAST ( )  Result Date:  08/24/2022 CLINICAL DATA:  Head trauma EXAM: CT HEAD WITHOUT CONTRAST TECHNIQUE: Contiguous axial images were obtained from the base of the skull through the vertex without intravenous contrast. RADIATION DOSE REDUCTION: This exam was performed according to the departmental dose-optimization program which includes automated exposure control, adjustment of the mA and/or kV according to patient size and/or use of iterative reconstruction technique. COMPARISON:  None Available. FINDINGS: Brain: No evidence of acute infarction, hemorrhage, hydrocephalus, extra-axial collection or mass lesion/mass effect. Vascular: No hyperdense vessel or unexpected calcification. Skull: Normal. Negative for fracture or focal lesion. Sinuses/Orbits: There are air-fluid levels throughout the paranasal sinuses diffusely. Mastoid air cells are clear. Orbits are within normal limits. Other: None. IMPRESSION: 1. No acute intracranial process. 2. Air-fluid levels throughout the paranasal sinuses worrisome for acute sinusitis. Electronically Signed   By: Darliss Cheney M.D.   On: 08/24/2022 18:46   DG Chest Portable 1 View  Result Date: 08/24/2022 CLINICAL DATA:  Fever and altered mental status EXAM: PORTABLE CHEST 1 VIEW COMPARISON:  None Available. FINDINGS: Artifact from EKG leads. Dialysis catheter with tip at the upper right atrium. There is no edema, consolidation, effusion, or pneumothorax. IMPRESSION: No evidence of active disease. Electronically Signed   By: Christiane Ha  Watts M.D.   On: 08/24/2022 17:18    Medications:  [START ON 08/27/2022] ceFEPime (MAXIPIME) IV     sodium chloride      acetaminophen  812.5 mg Oral Q6H   Chlorhexidine Gluconate Cloth  6 each Topical Daily   Chlorhexidine Gluconate Cloth  6 each Topical Q0600   DULoxetine  60 mg Oral Daily   heparin  5,000 Units Subcutaneous Q8H   insulin aspart  0-5 Units Subcutaneous QHS   insulin aspart  0-9 Units Subcutaneous TID WC   insulin glargine-yfgn  15 Units  Subcutaneous QHS   nicotine  14 mg Transdermal Daily   pregabalin  50 mg Oral Daily   rosuvastatin  10 mg Oral Daily    Dialysis Orders: [facility]  4h   400/1.5   66kg  3K/2.5Ca bath   [facility] TDC   Heparin none - last OP HD 8/17, post wt 69.2kg - routinely comes off 1.5- 3kg over dry - hectorol 4 mcg IV three times per week - mircera 30 mcg IV q 4 wks, last 7/23, due 8/20 (yest, missed)  Assessment/Plan: Sepsis: + COVID and GNR bacteremia (Proteus + Enterobacter), Urine Cx also growing GNR. TDC removed 8/22. On Cefepime for now. No HCAP on CXR. Plts falling, likely due to sepsis. AMS: Likely due to #1, skips HD often, although does have some residual renal function. May be some role of uremia here. ESRD: TTS schedule. Underdialyzed in general - skips HD often. S/p 1hr of HD last night - had to stop due to intense rigors. TDC removed this AM. At this venture - volume is fine, not hyperkalemic (was supplemented today). Will treat acidosis with bicarb and wait to place new TDC until bacteremia has cleared. If anything changes, will need temp line for HD. HTN/volume: No edema on exam, CXR clear. BP low sided, no meds for now. Anemia of ESRD: Hgb 10.7 - watch for now without ESA. Secondary HPTH: CorrCa ok, Phos pending. Restart VDRA with next HD and check Phos. Nutrition: Alb very low, adding supps. T2DM HFrEF (30-35%) PAD s/p B amputations  Ozzie Hoyle, PA-C 08/26/2022, 11:40 AM  [employer]

## 2022-08-26 NOTE — Progress Notes (Signed)
Received patient at bedside.  Alert and oriented.  Informed consent signed and in chart.   TX duration: 52 minutes  Patient unable to tolerate well; uncontrollable shivering, elevated BP and HR.  Patient remains in bed, symptoms subsiding. Alert, without acute distress.  Hand-off given to patient's nurse.   Access used: [facility] TDC Access issues: None  Total UF removed: 100 mL Medication(s) given: None Post HD VS: see Data insert Post HD weight: Unable to obtain    08/25/22 2345  Vitals  Temp 98.9 F (37.2 C)  Temp Source Oral  BP (!) 181/90  MAP (mmHg) 112  BP Location Left Arm  BP Method Automatic  Patient Position (if appropriate) Lying  Pulse Rate (!) 143  Pulse Rate Source Dinamap  ECG Heart Rate (!) 146  Resp 20  Oxygen Therapy  O2 Device Room Air  Patient Activity (if Appropriate) In bed  Pulse Oximetry Type Intermittent  Post Treatment  Dialyzer Clearance Lightly streaked  Hemodialysis Intake (mL) 0 mL  Liters Processed 15.7  Fluid Removed (mL) 100 mL  Tolerated HD Treatment No (Comment) (Patient reported feeling cold and shivering uncontrollably, elevated BP and heart rate also noted.)  Post-Hemodialysis Comments Nephrologist notified and treatment terminated d/t unstable symptomatic response during treatment.  Note  Patient Observations Patient  A&O x 3, symptoms subsiding , conditon stable for this treatment d/c.  Hemodialysis Catheter Right Internal jugular Double lumen Permanent (Tunneled)  Placement Date/Time: 05/13/21 1657   Placed prior to admission: No  Time Out: Correct patient;Correct site;Correct procedure  Maximum sterile barrier precautions: Hand hygiene;Cap;Mask;Sterile gown;Sterile gloves;Large sterile sheet  Site Prep: Chlorh...  Blue Lumen Status Flushed;Heparin locked;Dead end cap in place  Red Lumen Status Flushed;Heparin locked;Dead end cap in place  Purple Lumen Status N/A  Catheter fill solution Heparin 1000 units/ml  Catheter fill  volume (Arterial) 1.6 cc  Catheter fill volume (Venous) 1.6  Dressing Type Transparent  Dressing Status Antimicrobial disc in place;Clean, Dry, Intact  Drainage Description None  Post treatment catheter status Capped and Clamped      [facility]

## 2022-08-26 NOTE — Progress Notes (Addendum)
HD#1 SUBJECTIVE:  Patient Summary: [name] is a 62 y.o. male with a pertinent PMH of ESRD on hemodialysis, Tuesdays, Thursdays, Saturday, type 2 diabetes on insulin, hypertension, hyperlipidemia, PAD, HFrEF (EF 30-35%) presented to ED with altered mental status at and admitted for acute metabolic encephalopathy.  Overnight Events: HD yesterday evening, pt unable to tolerate due to "uncontrollable shivering, increased blood pressure and heart rate."  Interim History: Patient was evaluated at bedside. He says he feels okay this morning, but is uncomfortable from laying on his back for so long and wanted to be turned. He states he does not remember going to dialysis yesterday. He was on supplemental oxygen this morning but denies any shortness of breath, so we turned it off and he is satting well. He is oriented to person, place, time and situation. He denies any chest pain, lightheadedness, shortness of breath, coughing.   OBJECTIVE:  Vital Signs: Vitals:   08/25/22 2326 08/25/22 2345 08/26/22 0640 08/26/22 0716  BP: (!) 157/119 (!) 181/90 (!) 104/53 (!) 99/47  Pulse: (!) 142 (!) 143 65 97  Resp:  20 19 18   Temp:  98.9 F (37.2 C) 98.4 F (36.9 C) 98.4 F (36.9 C)  TempSrc:  Oral    SpO2:   (!) 80% 99%  Weight:      Height:       Supplemental O2: Room Air SpO2: 99 % O2 Flow Rate (L/min): 2 L/min  Filed Weights   08/25/22 0045  Weight: 66 kg     Intake/Output Summary (Last 24 hours) at 08/26/2022 1131 Last data filed at 08/26/2022 0600 Gross per 24 hour  Intake 240 ml  Output 880 ml  Net -640 ml   Net IO Since Admission: 299.77 mL [08/26/22 1131]  Physical Exam:  General: no acute distress, laying in bed comfortably Cardiac: regular rate and rhythm Pulmonary: CTA bilaterally, regular respiratory rate and effort Abdomen: soft, non-tender, non-distended Skin: dry bandage over chest where HD cath removed MSK: bilateral LE amputations; muscle atrophy from chronic  immobility Neuro: No focal deficits. Alert and oriented x 4.  Psych: Normal mood and affect  Patient Lines/Drains/Airways Status     Active Line/Drains/Airways     Name Placement date Placement time Site Days   Peripheral IV 08/24/22 20 G Anterior;Left;Proximal Forearm 08/24/22  1605  Forearm  2   Peripheral IV 08/24/22 18 G Proximal;Right;Posterior Forearm 08/24/22  --  Forearm  2   Hemodialysis Catheter Right Internal jugular Double lumen Permanent (Tunneled) 05/13/21  1657  Internal jugular  470            Pertinent Labs:    Latest Ref Rng & Units 08/26/2022    4:22 AM 08/25/2022    1:40 AM 08/24/2022    4:03 PM  CBC  WBC 4.0 - 10.5 K/uL 12.9  12.0  14.2   Hemoglobin 13.0 - 17.0 g/dL 54.0  9.8  98.1   Hematocrit 39.0 - 52.0 % 32.4  29.9  34.5   Platelets 150 - 400 K/uL 66  110  125        Latest Ref Rng & Units 08/26/2022    4:22 AM 08/25/2022    1:40 AM 08/24/2022    4:03 PM  CMP  Glucose 70 - 99 mg/dL 191  478  295   BUN 8 - 23 mg/dL 51  74  73   Creatinine 0.61 - 1.24 mg/dL 6.21  3.08  6.57   Sodium 135 - 145 mmol/L 130  128  131   Potassium 3.5 - 5.1 mmol/L 3.2  3.1  4.1   Chloride 98 - 111 mmol/L 97  97  96   CO2 22 - 32 mmol/L 16  17  17    Calcium 8.9 - 10.3 mg/dL 8.0  7.8  8.7   Total Protein 6.5 - 8.1 g/dL  6.5  7.3   Total Bilirubin 0.3 - 1.2 mg/dL  0.8  0.9   Alkaline Phos 38 - 126 U/L  50  59   AST 15 - 41 U/L  31  34   ALT 0 - 44 U/L  15  16     Recent Labs    08/25/22 2112 08/26/22 0725 08/26/22 1125  GLUCAP 147* 146* 151*     ASSESSMENT/PLAN:  Assessment: Principal Problem:   Acute metabolic encephalopathy  Plan: Gram Negative Bacteremia, Enterobacter cloacae and Proteus Suspected HD catheter infection Possible UTI, pending cultures Hypotension Altered mental status, resolved Patient is afebrile but was tachycardic with a heart rate in the 140s overnight. HR normalized this morning. BP is low at 99/47. His altered mental status has  resolved and he was oriented to person, place, time, and situation.  Blood culture on 8/20 positive for Gram-negative rods (Enterobactales, Enterobacter cloacae and Proteus); susceptibilities are pending. Urine grew >100,000 colonies gram negative rods, cultures pending. Bacteremia possibly secondary to dialysis catheter vs urinary tract infection.  Spoke with Dr. Arlean Hopping in nephrology about removing the HD catheter for source control since the patient had rigors during HD, which is consistent with a deep catheter infection. Plan is to consult IR to remove the tunnel catheter today; per nephrology, will wait to replace catheter until bacteremia is cleared.  - Continue IV cefepime 1 g, day 3; cefepime administration changed from HD cath to PIV  - Repeat blood cultures - IR consult for tunnel catheter removal today - Follow-up on urine culture, blood culture susceptibilities - Coreg held in the setting of hypotension; restarted home duloxetine - 500cc NS bolus  ESRD on HD, TTS Hypokalemia Hyponatremia, improving Anemia of Chronic Disease Patient was unable to tolerate dialysis last night, ended the session after 1 hour due to rigors, high BP/HR.  Treatment duration was only 52 minutes.  Last session prior to that was on Saturday, which he was also unable to complete due to chest pain.  Per nephrology, he only had 2 dialysis sessions this entire month.  Nephrology consulted, following. Sodium 130, up from 128 yesterday. Potassium 3.2.  - IR consult to remove tunnel catheter today; place temp catheter tomorrow for possible HD tomorrow or this weekend - 40 mEq potassium - Daily CBC, BMP; replete electrolytes as necessary - Per nephrology, will wait to replace catheter until bacteremia is cleared.  - Nephrology following  COVID-19 infection Tested positive for COVID-19 in the ED. Patient remains afebrile and denies any respiratory symptoms - duonebs PRN for SOB/wheezing - Paxlovid held  Chronic  conditions: HFrEF (EF 30-35%), coreg 6.125mg  BID is held for hypotension; HLD/PAD, on rosuvastatin 10 mg daily; insulin-dependent T2DM, semglee 15 units at bedtime with SSI; tobacco use disorder, nicotine transdermal patch; left AKA, right BKA.  Best Practice: Diet: Renal diet IVF: None VTE: heparin injection 5,000 Units Start: 08/24/22 2200 Code: Full AB: IV cefepime 1g Therapy Recs: Pending, DME: wheelchair DISPO: Anticipated discharge in 1-2 days to Home pending Hemodialysis and medical stability.  Signature: Annett Fabian, MD  Internal Medicine Resident, PGY-1 [facility]  Pager: [phone] 11:31  AM, 08/26/2022   Please contact the on call pager after 5 pm and on weekends at [phone].

## 2022-08-26 NOTE — Plan of Care (Signed)
  Problem: Coping: Goal: Ability to adjust to condition or change in health will improve Outcome: Completed/Met

## 2022-08-26 NOTE — Progress Notes (Signed)
PHARMACY ANTIBIOTIC CONSULT NOTE   [name] a 62 y.o. male admitted with AMS, found to have GNR bacteremia with E. Cloacae and Proteus spp. Pharmacy has been consulted for cefepime dosing. Of note, patient is ESRD on HD TTS as an outpatient.   Patient received an abbreviated HD session overnight 8/21, lasting ~ 1 hour. On admission, he had been dosed with Cefepime 2g. Cefepime likely still therapeutic post-HD session, however in setting of GN bacteremia, and with residual UOP will re-time daily dose for 8/22 AM.   Patient's HD catheter is planned to be removed 8/22 as it may be nidus for infection. Ucx noted to be growing GNRs.    Estimated Creatinine Clearance: 13.2 mL/min (A) (by C-G formula based on SCr of 5.41 mg/dL (H)).  Plan: Continue Cefepime 1g IV Q24H- F/U HD schedule to change dosing to 2g IV QHD  Monitor HD Schedule/tolerability, C/S  Allergies:  Allergies  Allergen Reactions   Benicar [Olmesartan] Cough    Filed Weights   08/25/22 0045  Weight: 66 kg (145 lb 8.1 oz)       Latest Ref Rng & Units 08/26/2022    4:22 AM 08/25/2022    1:40 AM 08/24/2022    4:03 PM  CBC  WBC 4.0 - 10.5 K/uL 12.9  12.0  14.2   Hemoglobin 13.0 - 17.0 g/dL 27.2  9.8  53.6   Hematocrit 39.0 - 52.0 % 32.4  29.9  34.5   Platelets 150 - 400 K/uL 66  110  125     Antibiotics Given (last 72 hours)     Date/Time Action Medication Dose Rate   08/24/22 1655 New Bag/Given   ceFEPIme (MAXIPIME) 2 g in sodium chloride 0.9 % 100 mL IVPB 2 g 200 mL/hr   08/24/22 1753 New Bag/Given   metroNIDAZOLE (FLAGYL) IVPB 500 mg 500 mg 100 mL/hr   08/24/22 1856 New Bag/Given   vancomycin (VANCOREADY) IVPB 1250 mg/250 mL 1,250 mg 166.7 mL/hr   08/25/22 1749 New Bag/Given   ceFEPIme (MAXIPIME) 1 g in sodium chloride 0.9 % 100 mL IVPB 1 g 200 mL/hr       Antimicrobials this admission: Vanc/MTZ 8/20 x1  CFP 8/20>>c   Microbiology results: COVID + (no active disease on CXR)- no paxlovid   8/20 Bcx:  3/4 GNRs - e cloacae, proteus  8/20 Ucx: 100K GNRs   Thank you for allowing pharmacy to be a part of this patient's care.  Jani Gravel, PharmD Clinical Pharmacist  08/26/2022 10:01 AM

## 2022-08-27 DIAGNOSIS — G9341 Metabolic encephalopathy: Secondary | ICD-10-CM | POA: Diagnosis not present

## 2022-08-27 DIAGNOSIS — T8571XA Infection and inflammatory reaction due to peritoneal dialysis catheter, initial encounter: Secondary | ICD-10-CM

## 2022-08-27 DIAGNOSIS — R7881 Bacteremia: Secondary | ICD-10-CM | POA: Diagnosis not present

## 2022-08-27 DIAGNOSIS — D72829 Elevated white blood cell count, unspecified: Secondary | ICD-10-CM | POA: Diagnosis not present

## 2022-08-27 LAB — RENAL FUNCTION PANEL
Albumin: 2.2 g/dL — ABNORMAL LOW (ref 3.5–5.0)
Albumin: 2.4 g/dL — ABNORMAL LOW (ref 3.5–5.0)
Anion gap: 11 (ref 5–15)
Anion gap: 16 — ABNORMAL HIGH (ref 5–15)
BUN: 63 mg/dL — ABNORMAL HIGH (ref 8–23)
BUN: 64 mg/dL — ABNORMAL HIGH (ref 8–23)
CO2: 19 mmol/L — ABNORMAL LOW (ref 22–32)
CO2: 20 mmol/L — ABNORMAL LOW (ref 22–32)
Calcium: 7.3 mg/dL — ABNORMAL LOW (ref 8.9–10.3)
Calcium: 7.7 mg/dL — ABNORMAL LOW (ref 8.9–10.3)
Chloride: 96 mmol/L — ABNORMAL LOW (ref 98–111)
Chloride: 99 mmol/L (ref 98–111)
Creatinine, Ser: 6.03 mg/dL — ABNORMAL HIGH (ref 0.61–1.24)
Creatinine, Ser: 6.03 mg/dL — ABNORMAL HIGH (ref 0.61–1.24)
GFR, Estimated: 10 mL/min — ABNORMAL LOW (ref >60)
GFR, Estimated: 10 mL/min — ABNORMAL LOW (ref >60)
Glucose, Bld: 115 mg/dL — ABNORMAL HIGH (ref 70–99)
Glucose, Bld: 167 mg/dL — ABNORMAL HIGH (ref 70–99)
Phosphorus: 4.3 mg/dL (ref 2.5–4.6)
Phosphorus: 4.9 mg/dL — ABNORMAL HIGH (ref 2.5–4.6)
Potassium: 4 mmol/L (ref 3.5–5.1)
Potassium: 4.9 mmol/L (ref 3.5–5.1)
Sodium: 129 mmol/L — ABNORMAL LOW (ref 135–145)
Sodium: 132 mmol/L — ABNORMAL LOW (ref 135–145)

## 2022-08-27 LAB — CULTURE, BLOOD (ROUTINE X 2)
Special Requests: ADEQUATE
Special Requests: ADEQUATE

## 2022-08-27 LAB — CBC
HCT: 28.3 % — ABNORMAL LOW (ref 39.0–52.0)
Hemoglobin: 9.4 g/dL — ABNORMAL LOW (ref 13.0–17.0)
MCH: 29.3 pg (ref 26.0–34.0)
MCHC: 33.2 g/dL (ref 30.0–36.0)
MCV: 88.2 fL (ref 80.0–100.0)
Platelets: 78 10*3/uL — ABNORMAL LOW (ref 150–400)
RBC: 3.21 MIL/uL — ABNORMAL LOW (ref 4.22–5.81)
RDW: 13.1 % (ref 11.5–15.5)
WBC: 19.1 10*3/uL — ABNORMAL HIGH (ref 4.0–10.5)
nRBC: 0 % (ref 0.0–0.2)

## 2022-08-27 LAB — GLUCOSE, CAPILLARY
Glucose-Capillary: 193 mg/dL — ABNORMAL HIGH (ref 70–99)
Glucose-Capillary: 170 mg/dL — ABNORMAL HIGH (ref 70–99)
Glucose-Capillary: 116 mg/dL — ABNORMAL HIGH (ref 70–99)
Glucose-Capillary: 111 mg/dL — ABNORMAL HIGH (ref 70–99)

## 2022-08-27 LAB — URINE CULTURE: Culture: >=100000 — AB

## 2022-08-27 MED ORDER — DARBEPOETIN ALFA 100 MCG/0.5ML IJ SOSY
100.0000 ug | PREFILLED_SYRINGE | INTRAMUSCULAR | Status: DC
  Administered 2022-08-28: 100 ug via SUBCUTANEOUS
  Filled 2022-08-27 (×2): qty 0.5

## 2022-08-27 MED ORDER — LEVOFLOXACIN 500 MG PO TABS
500.0000 mg | ORAL_TABLET | ORAL | Status: AC
  Administered 2022-08-29: 500 mg via ORAL
  Filled 2022-08-27: qty 1

## 2022-08-27 MED ORDER — NEPRO/CARBSTEADY PO LIQD
237.0000 mL | Freq: Two times a day (BID) | ORAL | Status: DC
  Administered 2022-08-27 – 2022-09-02 (×12): 237 mL via ORAL

## 2022-08-27 MED ORDER — LEVOFLOXACIN 750 MG PO TABS
750.0000 mg | ORAL_TABLET | Freq: Once | ORAL | Status: AC
  Administered 2022-08-27: 750 mg via ORAL
  Filled 2022-08-27: qty 1

## 2022-08-27 NOTE — Plan of Care (Signed)
  Problem: Fluid Volume: Goal: Ability to maintain a balanced intake and output will improve Outcome: Progressing   

## 2022-08-27 NOTE — Progress Notes (Signed)
HD#2 SUBJECTIVE:  Patient Summary: [name] is a 62 y.o. with past medical history of ESRD on hemodialysis, Tuesdays, Thursdays, Saturday, type 2 diabetes, hypertension, hyperlipidemia, PAD, HFrEF presented to ED with altered mental status at and admitted for acute metabolic encephalopathy and bacteremia.   Overnight Events: None  Interim History: Pt evaluated at bedside, laying in bed comfortably. Pt denies any chest pain or shortness of breath. Pt reports pain and numbness at the DIP/PIP and metacarpals bilaterally. Otherwise, no abdominal pain. NO cough or recent fevers. No urinary symptoms. He reports urinating appropriately.   OBJECTIVE:  Vital Signs: Vitals:   08/26/22 1400 08/26/22 1630 08/26/22 2137 08/27/22 0458  BP: 105/62 (!) 107/59 (!) 95/55 120/68  Pulse: 81 65 83 83  Resp:  18 20   Temp: 97.8 F (36.6 C) 97.9 F (36.6 C) (!) 97.5 F (36.4 C) 98.2 F (36.8 C)  TempSrc: Oral Oral Oral Oral  SpO2: 100%  100% 100%  Weight:      Height:       Supplemental O2: Room Air SpO2: 100 % O2 Flow Rate (L/min): 2 L/min  Filed Weights   08/25/22 0045  Weight: 66 kg     Intake/Output Summary (Last 24 hours) at 08/27/2022 1005 Last data filed at 08/27/2022 0600 Gross per 24 hour  Intake 1169.96 ml  Output 1 ml  Net 1168.96 ml   Net IO Since Admission: 1,528.73 mL [08/27/22 1005]  Physical Exam: Physical Exam  Constitutional: well-appearing, laying comfortably, no acute distress Cardiovascular: regular rate and rhythm, no m/r/g Pulmonary/Chest: lungs are clear to auscultation.  Abdominal: soft, non-tender, non-distended, bowel sounds present MSK: Right below knee amputation, left above-knee amputation Neurological: +Numbness and pain at the DIP, PIP, MCP joints bilaterally.  Skin: warm and dry Psych: Normal mood and Affect  Patient Lines/Drains/Airways Status     Active Line/Drains/Airways     Name Placement date Placement time Site Days   Peripheral IV  08/24/22 20 G Anterior;Left;Proximal Forearm 08/24/22  1605  Forearm  3   Peripheral IV 08/24/22 18 G Proximal;Right;Posterior Forearm 08/24/22  --  Forearm  3   Hemodialysis Catheter Right Internal jugular Double lumen Permanent (Tunneled) 05/13/21  1657  Internal jugular  471             ASSESSMENT/PLAN:  Assessment: Principal Problem:   Acute metabolic encephalopathy   Plan: #Gram Negative Bacteremia, Enterobacter cloacae, Proteus Mirabilis, and Serratia Fonticola  (2/2) HD catheter infection #Altered mental status, resolved #Leukocytosis  #Lactic Acidosis - Resolved  Presented with retrograde memory loss upon presentation, last memory was getting on the bus two days ago with his cousin. Initial presentation in ED was temp 101.1 and tachycardiac. He was oriented to self, day, and place  UA positive for many bacteria, proteinuria and large Hgb. LA 2.0, WBC 14.2, COVID positive. Code sepsis activated in ED, pt was given one dose vancomycin, Cefepime, and flagyl in ED. CXR negative for active disease. Blood cultures 8/20 grew serratia fonticola, enterobacter cloacae, and proteus mirabilis with adequate sensitivities. Bacteremia possibly secondary to dialysis catheter. Nephrology on board.   Patient evaluated today, nonfebrile, No acute symptoms. Patient had HD catheter removed yesterday by IR. WBC trended to 19.1 from 12.9. Blood cultures 8/22 pending. Will wait for bacterial clearance prior to placing another HD catheter.  - Transitioned to PO Levaquin, first dose 750 mg today, then 500 mg every 48 hours.    - Follow up repeat blood cultures for clearance    #  Klebsiella positive UTI  Presented with UA positive for large Hgb, ketones, protein>300, small leukocytes, RBC/WBC >50, and many bacteria. Urine culture positive for Klebsiella. Pt denies any dysuria or urinary symptoms. Adequate sensitivities. Patient is getting treated with PO Levaquin for above, which will also cover for his  UTI.    #ESRD on HD, TTS #Hypokalemia - resolved  #Hyponatremia  #Anemia of Chronic Disease #Thrombocytopenia  #Hypotension- resolved  Patient presented with electrolyte abnormalities. Initially presented with potassium of 4.1 then decreased to 3.1, platelets of 125, and sodium of 128. Patient was unable to tolerate dialysis 8/21, ended the session after 1 hour due to rigors. Treatment duration was only 52 minutes.  Last session prior to that was on Saturday, which he was also unable to complete due to chest pain. Per nephrology, he only had 2 dialysis sessions this entire month.  Sodium 129. Potassium 4.0. PLT 78, Hgb 9.4.  - HD catheter removed 8/22  - IV Aranesp every Saturday  - Daily CBC, BMP; replete electrolytes as necessary - Per nephrology, will wait to replace catheter until bacteremia is cleared.  - Nephrology following   #COVID-19 infection Tested positive for COVID-19 in the ED. Patient remains afebrile and denies any respiratory symptoms - duonebs PRN for SOB/wheezing  #Type II diabetes mellitus - insulin dependent  #Proteinuria with ketones #History of diabetic nephropathy #R BKA #L AKA Last A1c was 8.7 on 03/03/2022, history of non compliance with medications. Patient reports numbness of his bilateral hands, mostly on the fingertips L>R. FSBG AM 243. Patient home insulin consist of Novolog 5 units TID and Lantus 15 units at bedtime.  - Semglee 15 units at bedtime with sliding scale insulin  - Continue CBG checks - Pregabalin 50 mg daily and Cymbalta 60 mg daily   #History of HFrEF No signs of heart failure exacerbation on examination. Patient denies any shortness of breath or cough as this time. No rales or wheezing on lung exam. Last echo on 08/12/2021 demonstrated EF of 30 to 35%. GDMT limited to ESRD. Home medication includes Coreg 6.125 BID.  - HOLD Coreg 6.125 BID in the setting of hypotension. Blood pressures today 110s-120s/ 65s-80s    #History of hyperlipidemia   #History of PAD - Continue Rosuvastatin 10 mg daily    #History of tobacco use  Home medication includes Varenicline 0.5 mg daily. Denies using any tobacco since July, however, upon speaking with Sister, notes that he is a heavy smoker and was trying to leave ED yesterday to smoke outside.  - Nicotine transdermal patch   Best Practice: Diet: Renal diet IVF: Fluids: None  VTE: heparin injection 5,000 Units Start: 08/24/22 2200 Code: Full AB: PO Levaquin  Therapy Recs: None, DME: none Family Contact: Sister, [name], called and notified. DISPO: Anticipated discharge  Monday  to Home pending  Bacteremia resolution and HD catheter placement .  Signature: Jeral Pinch, D.O.  Internal Medicine Resident, PGY-1 [facility]  Pager: [phone] 10:05 AM, 08/27/2022   Please contact the on call pager after 5 pm and on weekends at [phone].

## 2022-08-27 NOTE — Progress Notes (Signed)
Subjective:  seen in room, Noted HD 8/21 pm . Only 1 hr  2/2  rigors with hd , HD cath dc , no cos this am , alert  baseline   Objective Vital signs in last 24 hours: Vitals:   08/26/22 1400 08/26/22 1630 08/26/22 2137 08/27/22 0458  BP: 105/62 (!) 107/59 (!) 95/55 120/68  Pulse: 81 65 83 83  Resp:  18 20   Temp: 97.8 F (36.6 C) 97.9 F (36.6 C) (!) 97.5 F (36.4 C) 98.2 F (36.8 C)  TempSrc: Oral Oral Oral Oral  SpO2: 100%  100% 100%  Weight:      Height:       Weight change:   Physical Exam: General:  alert chronically ll appearing man, room air,NAD  Heart: RRR; no murmur Lungs: CTAB, poor inspiratory effort Abdomen: soft NT, ND  Extremities: L AKA, R BKA - no stump edema Dialysis Access: none at this / RIJ  dressing dry clear    Dialysis Orders: [facility]  4h   400/1.5   66kg  3K/2.5Ca bath   [facility] TDC   Heparin none - last OP HD 8/17, post wt 69.2kg - routinely comes off 1.5- 3kg over dry - hectorol 4 mcg IV three times per week - mircera 30 mcg IV q 4 wks, last 7/23, due 8/20 (yest, missed)   Assessment/Plan: Sepsis: + COVID and GNR bacteremia (Proteus + Enterobacter), Urine Cx = GNR./ TDC removed 8/22. On Cefepime for now. No HCAP on CXR. Plts falling, likely due to sepsis. AMS: Likely due to #1,  mild uremia  skipping op hd / MS  now baseline . ESRD: TTS schedule.  Noncompliant op hd -. S/p 1hr of HD last 8/21 - had to stop due to intense rigors. TDC removed 8/22. At this venture - volume is fine, not hyperkalemic (was supplemented 8/22). acidosis RX with bicarb and wait to place new TDC until bacteremia has cleared. If anything changes, will need temp line for HD. HTN/volume: No edema on exam, CXR clear. BP low sided, no meds for now. Anemia of ESRD: Hgb 10.7 -9.4  this am , without ESA. Will restart sub q  sat  Secondary HPTH: CorrCa ok, Phos 4.23. Restart VDRA with next HD and check Phos. Nutrition: Alb 2.2 , supplement  T2DM HFrEF (30-35%) PAD s/p B  amputations   Lenny Pastel, PA-C [facility] Beeper [phone] 08/27/2022,9:23 AM  LOS: 2 days   Labs: Basic Metabolic Panel: Recent Labs  Lab 08/26/22 0418 08/26/22 0422 08/26/22 2349 08/27/22 0454  NA  --  130* 132* 129*  K  --  3.2* 4.9 4.0  CL  --  97* 96* 99  CO2  --  16* 20* 19*  GLUCOSE  --  132* 115* 167*  BUN  --  51* 64* 63*  CREATININE  --  5.41* 6.03* 6.03*  CALCIUM  --  8.0* 7.7* 7.3*  PHOS <1.0*  --  4.9* 4.3   Liver Function Tests: Recent Labs  Lab 08/24/22 1603 08/25/22 0140 08/26/22 2349 08/27/22 0454  AST 34 31  --   --   ALT 16 15  --   --   ALKPHOS 59 50  --   --   BILITOT 0.9 0.8  --   --   PROT 7.3 6.5  --   --   ALBUMIN 3.2* 2.8* 2.4* 2.2*   No results for input(s): "LIPASE", "AMYLASE" in the last 168 hours. No results for input(s): "  AMMONIA" in the last 168 hours. CBC: Recent Labs  Lab 08/24/22 1603 08/25/22 0140 08/26/22 0422 08/27/22 0454  WBC 14.2* 12.0* 12.9* 19.1*  NEUTROABS 12.3*  --   --   --   HGB 11.4* 9.8* 10.7* 9.4*  HCT 34.5* 29.9* 32.4* 28.3*  MCV 89.8 90.3 90.8 88.2  PLT 125* 110* 66* 78*   Cardiac Enzymes: No results for input(s): "CKTOTAL", "CKMB", "CKMBINDEX", "TROPONINI" in the last 168 hours. CBG: Recent Labs  Lab 08/26/22 0725 08/26/22 1125 08/26/22 1629 08/26/22 2156 08/27/22 0859  GLUCAP 146* 151* 164* 125* 193*    Studies/Results: IR Removal Tun Cv Cath W/O FL  Result Date: 08/26/2022 INDICATION: 62 year old with ESRD on HD via right IJ tunneled central catheter. Admitted with bacteremia. Request made for removal for line holiday. EXAM: REMOVAL TUNNELED CENTRAL VENOUS CATHETER MEDICATIONS: 4 mL 1% lidocaine ANESTHESIA/SEDATION: None FLUOROSCOPY: None COMPLICATIONS: None immediate. PROCEDURE: Informed written consent was obtained from the patient after a thorough discussion of the procedural risks, benefits and alternatives. All questions were addressed. Maximal Sterile Barrier  Technique was utilized including caps, mask, sterile gowns, sterile gloves, sterile drape, hand hygiene and skin antiseptic. A timeout was performed prior to the initiation of the procedure. The patient's right chest and catheter was prepped and draped in a normal sterile fashion. Heparin was removed from both ports of catheter. 1% lidocaine was used for local anesthesia. Using gentle blunt dissection the cuff of the catheter was exposed and the catheter was removed in its entirety. Pressure was held till hemostasis was obtained. A sterile dressing was applied. The patient tolerated the procedure well with no immediate complications. IMPRESSION: Successful catheter removal as described above. Performed by: Loyce Dys PA-C Electronically Signed   By: Gilmer Mor D.O.   On: 08/26/2022 16:06   Medications:  ceFEPime (MAXIPIME) IV      (feeding supplement) PROSource Plus  30 mL Oral BID BM   acetaminophen  812.5 mg Oral Q6H   Chlorhexidine Gluconate Cloth  6 each Topical Daily   Chlorhexidine Gluconate Cloth  6 each Topical Q0600   DULoxetine  60 mg Oral Daily   heparin  5,000 Units Subcutaneous Q8H   insulin aspart  0-5 Units Subcutaneous QHS   insulin aspart  0-9 Units Subcutaneous TID WC   insulin glargine-yfgn  15 Units Subcutaneous QHS   nicotine  14 mg Transdermal Daily   pregabalin  50 mg Oral Daily   rosuvastatin  10 mg Oral Daily   sodium bicarbonate  1,300 mg Oral BID

## 2022-08-28 ENCOUNTER — Encounter (HOSPITAL_COMMUNITY): Payer: Self-pay | Admitting: Internal Medicine

## 2022-08-28 DIAGNOSIS — N3000 Acute cystitis without hematuria: Secondary | ICD-10-CM | POA: Diagnosis present

## 2022-08-28 DIAGNOSIS — G9341 Metabolic encephalopathy: Secondary | ICD-10-CM | POA: Diagnosis not present

## 2022-08-28 DIAGNOSIS — N390 Urinary tract infection, site not specified: Secondary | ICD-10-CM | POA: Diagnosis not present

## 2022-08-28 DIAGNOSIS — Z992 Dependence on renal dialysis: Secondary | ICD-10-CM

## 2022-08-28 DIAGNOSIS — N186 End stage renal disease: Secondary | ICD-10-CM

## 2022-08-28 DIAGNOSIS — B961 Klebsiella pneumoniae [K. pneumoniae] as the cause of diseases classified elsewhere: Secondary | ICD-10-CM | POA: Diagnosis not present

## 2022-08-28 DIAGNOSIS — U071 COVID-19: Secondary | ICD-10-CM | POA: Diagnosis present

## 2022-08-28 LAB — RENAL FUNCTION PANEL
Albumin: 2.4 g/dL — ABNORMAL LOW (ref 3.5–5.0)
Anion gap: 10 (ref 5–15)
BUN: 65 mg/dL — ABNORMAL HIGH (ref 8–23)
CO2: 19 mmol/L — ABNORMAL LOW (ref 22–32)
Calcium: 7.7 mg/dL — ABNORMAL LOW (ref 8.9–10.3)
Chloride: 98 mmol/L (ref 98–111)
Creatinine, Ser: 5.83 mg/dL — ABNORMAL HIGH (ref 0.61–1.24)
GFR, Estimated: 10 mL/min — ABNORMAL LOW (ref >60)
Glucose, Bld: 121 mg/dL — ABNORMAL HIGH (ref 70–99)
Phosphorus: 3.5 mg/dL (ref 2.5–4.6)
Potassium: 4.1 mmol/L (ref 3.5–5.1)
Sodium: 127 mmol/L — ABNORMAL LOW (ref 135–145)

## 2022-08-28 LAB — CBC
HCT: 31.6 % — ABNORMAL LOW (ref 39.0–52.0)
Hemoglobin: 10.1 g/dL — ABNORMAL LOW (ref 13.0–17.0)
MCH: 28.9 pg (ref 26.0–34.0)
MCHC: 32 g/dL (ref 30.0–36.0)
MCV: 90.3 fL (ref 80.0–100.0)
Platelets: 101 10*3/uL — ABNORMAL LOW (ref 150–400)
RBC: 3.5 MIL/uL — ABNORMAL LOW (ref 4.22–5.81)
RDW: 13.1 % (ref 11.5–15.5)
WBC: 16.2 10*3/uL — ABNORMAL HIGH (ref 4.0–10.5)
nRBC: 0 % (ref 0.0–0.2)

## 2022-08-28 LAB — GLUCOSE, CAPILLARY
Glucose-Capillary: 106 mg/dL — ABNORMAL HIGH (ref 70–99)
Glucose-Capillary: 152 mg/dL — ABNORMAL HIGH (ref 70–99)
Glucose-Capillary: 121 mg/dL — ABNORMAL HIGH (ref 70–99)
Glucose-Capillary: 166 mg/dL — ABNORMAL HIGH (ref 70–99)

## 2022-08-28 MED ORDER — CARVEDILOL 6.25 MG PO TABS
6.2500 mg | ORAL_TABLET | Freq: Two times a day (BID) | ORAL | Status: DC
  Administered 2022-08-28 – 2022-09-02 (×11): 6.25 mg via ORAL
  Filled 2022-08-28 (×11): qty 1
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
## 2022-08-28 NOTE — Plan of Care (Signed)
  Problem: Fluid Volume: Goal: Ability to maintain a balanced intake and output will improve Outcome: Progressing   Problem: Health Behavior/Discharge Planning: Goal: Ability to identify and utilize available resources and services will improve Outcome: Progressing Goal: Ability to manage health-related needs will improve Outcome: Progressing   Problem: Metabolic: Goal: Ability to maintain appropriate glucose levels will improve Outcome: Progressing   Problem: Nutritional: Goal: Maintenance of adequate nutrition will improve Outcome: Not Progressing   Problem: Skin Integrity: Goal: Risk for impaired skin integrity will decrease Outcome: Progressing   Problem: Tissue Perfusion: Goal: Adequacy of tissue perfusion will improve Outcome: Progressing

## 2022-08-28 NOTE — Plan of Care (Signed)
  Problem: Fluid Volume: Goal: Ability to maintain a balanced intake and output will improve Outcome: Progressing   Problem: Health Behavior/Discharge Planning: Goal: Ability to identify and utilize available resources and services will improve Outcome: Progressing Goal: Ability to manage health-related needs will improve Outcome: Progressing

## 2022-08-28 NOTE — Progress Notes (Signed)
HD#3 SUBJECTIVE:  Patient Summary: [name] is a 62 y.o. with past medical history of ESRD on hemodialysis, Tuesdays, Thursdays, Saturday, type 2 diabetes, hypertension, hyperlipidemia, PAD, HFrEF presented to ED with altered mental status at and admitted for acute metabolic encephalopathy and bacteremia.   Overnight Events: None  Interim History: Pt evaluated at bedside, laying in bed comfortably. Pt denies any chest pain or shortness of breath. Pt continues to report pain and numbness of his hands. States it his harder for him to extend and flex his fingers. Otherwise, no abdominal pain. No cough or recent fevers. No urinary symptoms. He reports urinating appropriately.   OBJECTIVE:  Vital Signs: Vitals:   08/27/22 1650 08/27/22 1936 08/28/22 0500 08/28/22 0931  BP: 127/77 (!) 156/75 (!) 149/77 (!) 149/79  Pulse: 91 91 92 85  Resp: 18 18 20 20   Temp: 97.6 F (36.4 C) 98.2 F (36.8 C) 98.5 F (36.9 C) 97.7 F (36.5 C)  TempSrc: Oral Oral Oral Oral  SpO2: 93% 100% 100% 100%  Weight:      Height:       Supplemental O2: Room Air SpO2: 100 % O2 Flow Rate (L/min): 2 L/min  Filed Weights   08/25/22 0045  Weight: 66 kg     Intake/Output Summary (Last 24 hours) at 08/28/2022 1324 Last data filed at 08/28/2022 0944 Gross per 24 hour  Intake 360 ml  Output 1300 ml  Net -940 ml   Net IO Since Admission: 1,188.73 mL [08/28/22 1324]  Physical Exam: Physical Exam  Constitutional: well-appearing, laying comfortably, no acute distress Cardiovascular: regular rate and rhythm, no m/r/g Pulmonary/Chest: lungs are clear to auscultation.  Abdominal: soft, non-tender, non-distended, bowel sounds present MSK: Right below knee amputation, left above-knee amputation Neurological: +Numbness and pain at the DIP, PIP, MCP joints bilaterally. +muscle wasting bilateral hands Skin: warm and dry Psych: Normal mood and Affect  Patient Lines/Drains/Airways Status     Active  Line/Drains/Airways     Name Placement date Placement time Site Days   Peripheral IV 08/24/22 20 G Anterior;Left;Proximal Forearm 08/24/22  1605  Forearm  4   Peripheral IV 08/24/22 18 G Proximal;Right;Posterior Forearm 08/24/22  --  Forearm  4             ASSESSMENT/PLAN:  Assessment: Principal Problem:   Acute metabolic encephalopathy Active Problems:   Acute cystitis without hematuria   COVID   Plan: #Gram Negative Bacteremia, Enterobacter cloacae, Proteus Mirabilis, and Serratia Fonticola  (2/2) HD catheter infection #Altered mental status, resolved #Leukocytosis  #Lactic Acidosis - Resolved  Presented with retrograde memory loss upon presentation, last memory was getting on the bus two days ago with his cousin. Initial presentation in ED was temp 101.1 and tachycardiac. He was oriented to self, day, and place  UA positive for many bacteria, proteinuria and large Hgb. LA 2.0, WBC 14.2, COVID positive. Code sepsis activated in ED, pt was given one dose vancomycin, Cefepime, and flagyl in ED. CXR negative for active disease. Blood cultures 8/20 grew serratia fonticola, enterobacter cloacae, and proteus mirabilis with adequate sensitivities. Bacteremia possibly secondary to dialysis catheter. Nephrology on board.    Patient evaluated today, nonfebrile, No acute symptoms. Patient had HD catheter removed 8/22 by IR. WBC trended to 16.2 (19.1). Blood cultures 8/22 No growth so far in 2 days.  - PO Levaquin 500 mg every 48 hours   - IR consulted for new HD catheter placement    #Klebsiella positive UTI  Presented with UA  positive for large Hgb, ketones, protein>300, small leukocytes, RBC/WBC >50, and many bacteria. Urine culture positive for Klebsiella. Pt denies any dysuria or urinary symptoms. Adequate sensitivities. Patient is getting treated with PO Levaquin for above, which will also cover for his UTI.     #ESRD on HD, TTS #Hypokalemia - resolved  #Hyponatremia  #Anemia of  Chronic Disease #Thrombocytopenia - improving   #Hypotension- resolved  Patient presented with electrolyte abnormalities. Initially presented with potassium of 4.1 then decreased to 3.1, platelets of 125, and sodium of 128. Patient was unable to tolerate dialysis 8/21, ended the session after 1 hour due to rigors. Treatment duration was only 52 minutes.  Last session prior to that was on Saturday, which he was also unable to complete due to chest pain. Per nephrology, he only had 2 dialysis sessions this entire month.  Sodium 127. Potassium 4.1. PLT 101, Hgb 10.1 - HD catheter removed 8/22  - Repeat blood cultures with no growth in 48 hours. - IR consulted for new line placement - IV Aranesp every Saturday  - Daily CBC, BMP; replete electrolytes as necessary - Nephrology following   #COVID-19 infection Tested positive for COVID-19 in the ED. Patient remains afebrile and denies any respiratory symptoms - duonebs PRN for SOB/wheezing   #Type II diabetes mellitus - insulin dependent  #Proteinuria with ketones #History of diabetic nephropathy #R BKA #L AKA Last A1c was 8.7 on 03/03/2022, history of non compliance with medications. Patient reports numbness of his bilateral hands, mostly on the fingertips L>R. Patient home insulin consist of Novolog 5 units TID and Lantus 15 units at bedtime.  - Semglee 15 units at bedtime with sliding scale insulin  - Continue CBG checks - Pregabalin 50 mg daily and Cymbalta 60 mg daily    #History of HFrEF No signs of heart failure exacerbation on examination. Patient denies any shortness of breath or cough as this time. No rales or wheezing on lung exam. Last echo on 08/12/2021 demonstrated EF of 30 to 35%. GDMT limited to ESRD. Home medication includes Coreg 6.125 BID.  - Continue Coreg 6.125 BID as hypotension has resolved.    #History of hyperlipidemia  #History of PAD - Continue Rosuvastatin 10 mg daily    #History of tobacco use  Home medication  includes Varenicline 0.5 mg daily. Denies using any tobacco since July, however, upon speaking with Sister, notes that he is a heavy smoker and was trying to leave ED yesterday to smoke outside.  - Nicotine transdermal patch   Best Practice: Diet: Renal diet IVF: Fluids: None VTE: heparin injection 5,000 Units Start: 08/24/22 2200 Code: Full AB: Levaquin  Family Contact:  called and notified. DISPO: Anticipated discharge  1-2 days  to Home pending  medical management .  Signature: Jeral Pinch, D.O.  Internal Medicine Resident, PGY-1 [facility]  Pager: [phone] 1:24 PM, 08/28/2022   Please contact the on call pager after 5 pm and on weekends at [phone].

## 2022-08-28 NOTE — Progress Notes (Signed)
Subjective: Seen in room COVID precautions, no SOB, chest pain or abdominal pain, wants Up out of bed discussed with RN  Objective Vital signs in last 24 hours: Vitals:   08/27/22 1650 08/27/22 1936 08/28/22 0500 08/28/22 0931  BP: 127/77 (!) 156/75 (!) 149/77 (!) 149/79  Pulse: 91 91 92 85  Resp: 18 18 20 20   Temp: 97.6 F (36.4 C) 98.2 F (36.8 C) 98.5 F (36.9 C) 97.7 F (36.5 C)  TempSrc: Oral Oral Oral Oral  SpO2: 93% 100% 100% 100%  Weight:      Height:       Weight change:   Physical Exam: General:  alert chronically ll appearing man, room air,NAD  Heart: RRR; no murmur Lungs: CTAB, nonlabored breathing 100% O2 sat room air Abdomen: NABS soft NT, ND  Extremities: L AKA, R BKA - no stump edema Dialysis Access: none at this      Dialysis Orders: [facility]  4h   400/1.5   66kg  3K/2.5Ca bath   [facility] TDC   Heparin none - last OP HD 8/17, post wt 69.2kg - routinely comes off 1.5- 3kg over dry - hectorol 4 mcg IV three times per week - mircera 30 mcg IV q 4 wks, last 7/23, due 8/20 (yest, missed)   Assessment/Plan: Sepsis: + COVID and GNR bacteremia (Proteus + Enterobacter), Urine Cx = GNR./ TDC removed 8/22. On Cefepime for now. No HCAP on CXR. Plts falling, likely due to sepsis. AMS: Likely due to #1,  mild uremia  skipping op hd / MS  now baseline . ESRD: TTS schedule.  Noncompliant op hd -. S/p 1hr of HD last 8/21 - had to stop due to intense rigors. TDC removed 8/22.  K4.1 at this venture - volume is fine, not hyperkalemic (was supplemented 8/22). acidosis RX with bicarb and wait to place new TDC until bacteremia has cleared. If anything changes, will need temp line for HD.//Will have IR place [facility] 8/26 HTN/volume: No edema on exam, CXR clear. BP ok , no meds for now. Anemia of ESRD: Hgb 10.1 <9.4 restarting sq Aranesp 100 mcg 8/24 Secondary HPTH: CorrCa ok, Phos 4.3. Restart VDRA with next HD  Nutrition: Alb 2.2 , supplement  T2DM HFrEF (30-35%) PAD s/p B  amputations   [name] Pastel, PA-C [facility] Beeper [phone] 08/28/2022,10:10 AM  LOS: 3 days   Labs: Basic Metabolic Panel: Recent Labs  Lab 08/26/22 2349 08/27/22 0454 08/28/22 0608  NA 132* 129* 127*  K 4.9 4.0 4.1  CL 96* 99 98  CO2 20* 19* 19*  GLUCOSE 115* 167* 121*  BUN 64* 63* 65*  CREATININE 6.03* 6.03* 5.83*  CALCIUM 7.7* 7.3* 7.7*  PHOS 4.9* 4.3 3.5   Liver Function Tests: Recent Labs  Lab 08/24/22 1603 08/25/22 0140 08/26/22 2349 08/27/22 0454 08/28/22 0608  AST 34 31  --   --   --   ALT 16 15  --   --   --   ALKPHOS 59 50  --   --   --   BILITOT 0.9 0.8  --   --   --   PROT 7.3 6.5  --   --   --   ALBUMIN 3.2* 2.8* 2.4* 2.2* 2.4*   No results for input(s): "LIPASE", "AMYLASE" in the last 168 hours. No results for input(s): "AMMONIA" in the last 168 hours. CBC: Recent Labs  Lab 08/24/22 1603 08/25/22 0140 08/26/22 0422 08/27/22 0454 08/28/22 0608  WBC 14.2*  12.0* 12.9* 19.1* 16.2*  NEUTROABS 12.3*  --   --   --   --   HGB 11.4* 9.8* 10.7* 9.4* 10.1*  HCT 34.5* 29.9* 32.4* 28.3* 31.6*  MCV 89.8 90.3 90.8 88.2 90.3  PLT 125* 110* 66* 78* 101*   Cardiac Enzymes: No results for input(s): "CKTOTAL", "CKMB", "CKMBINDEX", "TROPONINI" in the last 168 hours. CBG: Recent Labs  Lab 08/27/22 0859 08/27/22 1221 08/27/22 1645 08/27/22 2125 08/28/22 0727  GLUCAP 193* 170* 116* 111* 106*    Studies/Results: No results found. Medications:   (feeding supplement) PROSource Plus  30 mL Oral BID BM   acetaminophen  812.5 mg Oral Q6H   Chlorhexidine Gluconate Cloth  6 each Topical Daily   Chlorhexidine Gluconate Cloth  6 each Topical Q0600   darbepoetin (ARANESP) injection - DIALYSIS  100 mcg Subcutaneous Q Sat-1800   DULoxetine  60 mg Oral Daily   feeding supplement (NEPRO CARB STEADY)  237 mL Oral BID BM   heparin  5,000 Units Subcutaneous Q8H   insulin aspart  0-5 Units Subcutaneous QHS   insulin aspart  0-9 Units  Subcutaneous TID WC   insulin glargine-yfgn  15 Units Subcutaneous QHS   [START ON 08/29/2022] levofloxacin  500 mg Oral Q48H   nicotine  14 mg Transdermal Daily   pregabalin  50 mg Oral Daily   rosuvastatin  10 mg Oral Daily   sodium bicarbonate  1,300 mg Oral BID

## 2022-08-29 DIAGNOSIS — N186 End stage renal disease: Secondary | ICD-10-CM | POA: Diagnosis not present

## 2022-08-29 DIAGNOSIS — N390 Urinary tract infection, site not specified: Secondary | ICD-10-CM | POA: Diagnosis not present

## 2022-08-29 DIAGNOSIS — G9341 Metabolic encephalopathy: Secondary | ICD-10-CM | POA: Diagnosis not present

## 2022-08-29 DIAGNOSIS — B961 Klebsiella pneumoniae [K. pneumoniae] as the cause of diseases classified elsewhere: Secondary | ICD-10-CM | POA: Diagnosis not present

## 2022-08-29 LAB — CBC
HCT: 32 % — ABNORMAL LOW (ref 39.0–52.0)
Hemoglobin: 10.4 g/dL — ABNORMAL LOW (ref 13.0–17.0)
MCH: 29.2 pg (ref 26.0–34.0)
MCHC: 32.5 g/dL (ref 30.0–36.0)
MCV: 89.9 fL (ref 80.0–100.0)
Platelets: 131 10*3/uL — ABNORMAL LOW (ref 150–400)
RBC: 3.56 MIL/uL — ABNORMAL LOW (ref 4.22–5.81)
RDW: 13 % (ref 11.5–15.5)
WBC: 9.4 10*3/uL (ref 4.0–10.5)
nRBC: 0 % (ref 0.0–0.2)

## 2022-08-29 LAB — RENAL FUNCTION PANEL
Albumin: 2.4 g/dL — ABNORMAL LOW (ref 3.5–5.0)
Anion gap: 9 (ref 5–15)
BUN: 66 mg/dL — ABNORMAL HIGH (ref 8–23)
CO2: 22 mmol/L (ref 22–32)
Calcium: 8.1 mg/dL — ABNORMAL LOW (ref 8.9–10.3)
Chloride: 100 mmol/L (ref 98–111)
Creatinine, Ser: 5.86 mg/dL — ABNORMAL HIGH (ref 0.61–1.24)
GFR, Estimated: 10 mL/min — ABNORMAL LOW (ref >60)
Glucose, Bld: 176 mg/dL — ABNORMAL HIGH (ref 70–99)
Phosphorus: 4 mg/dL (ref 2.5–4.6)
Potassium: 3.9 mmol/L (ref 3.5–5.1)
Sodium: 131 mmol/L — ABNORMAL LOW (ref 135–145)

## 2022-08-29 LAB — GLUCOSE, CAPILLARY
Glucose-Capillary: 174 mg/dL — ABNORMAL HIGH (ref 70–99)
Glucose-Capillary: 114 mg/dL — ABNORMAL HIGH (ref 70–99)
Glucose-Capillary: 164 mg/dL — ABNORMAL HIGH (ref 70–99)
Glucose-Capillary: 103 mg/dL — ABNORMAL HIGH (ref 70–99)

## 2022-08-29 NOTE — Progress Notes (Addendum)
Subjective: [name] is a 62 year old male with a past medical history of ESRD on HD (Tuesday/Thursday/Saturday), T2DM, HTN, HFrEF (EF: 30-35%) who presented with AMS secondary to gram-negative bacteremia (+Serratia, +E. cloacae, +Proteus.)  No acute events overnight.  Medication refusals: Tylenol 812.5 mg x1.  No PRNs.  Patient I/Os: +4/-3L. IR consented patient to place HD catheter 8/26, old line removed 8/22.  On interview, patient states that his hands are getting frozen. He states that they are worse from yesterday. He notes that when his hands getoff the heating pads, they are getting more cold. Explained to patient that his circulation is not that well and that is the likely reasoning. He denies any chest pain or shortness of breath. He does feel little shocks in his leg stumps. He denies any urinary changes. He otherwise is doing well with his only concern being his hands being cold. He states he is just having a rough time with the hands.   Objective:  BP: 169/82.  HR: 85.  Afebrile.  Satting 100% on room air.   Vital signs in last 24 hours: Vitals:   08/28/22 1634 08/28/22 2114 08/29/22 0557 08/29/22 0902  BP: 123/72 135/70 (!) 169/82 (!) 163/61  Pulse: (!) 102 90 85 87  Resp: 20 20  17   Temp: 98.5 F (36.9 C) 99 F (37.2 C) 98.4 F (36.9 C) 98.3 F (36.8 C)  TempSrc: Oral Oral Oral Oral  SpO2: 99% 99% 100% 100%  Weight:      Height:       Constitutional: Chronically ill-appearing man laying in bed, no acute distress Cardiovascular: Regular rhythm no murmurs rubs or gallops Respiratory: Clear to auscultation bilaterally Abdominal: Bowel sounds present, nontender to palpation upper quadrant MSK: Left AKA, right BKA, bilateral upper extremities appear weak Neuro: No gross deficits noted Psych: Appropriate mood and affect   Pertinent Labs: Blood glucose: 166 (106-170 over last 24 hours)  Blood cultures: Left hand negative at 2 days x3  WBC: 9.4  (16.2) Hemoglobin: 10.4 (10.1) Platelets: 131 (101)  Sodium: 131 (121) BUN 66 (55) Creatinine: 5.86 (5.83) Calcium, corrected: 9.4 (9.0)  No new imaging  Assessment/Plan:  Principal Problem:   Acute metabolic encephalopathy Active Problems:   Acute cystitis without hematuria   COVID  #Gram Negative Bacteremia, Enterobacter cloacae, Proteus Mirabilis, and Serratia Fonticola  (2/2) HD catheter infection #Altered mental status, resolved #Leukocytosis  #Lactic Acidosis - Resolved  Presented with retrograde memory loss upon presentation, last memory was getting on the bus two days ago with his cousin. Initial presentation in ED was temp 101.1 and tachycardiac. He was oriented to self, day, and place  UA positive for many bacteria, proteinuria and large Hgb. LA 2.0, WBC 14.2, COVID positive. Code sepsis activated in ED, pt was given one dose vancomycin, Cefepime, and flagyl in ED. CXR negative for active disease. Blood cultures 8/20 grew serratia fonticola, enterobacter cloacae, and proteus mirabilis with adequate sensitivities. Bacteremia possibly secondary to dialysis catheter. Nephrology on board.    8/25: WBC: 9.4 from 16.2, decreasing. Afebrile.  Infection appears to be clearing.  BP 169/82 - likely related to volume status.  New HD cath to be placed tomorrow, dialysis after.  Likely discharge postdialysis. - PO Levaquin 500 mg every 48 hours today, 8/25 - IR saw yesterday, patient consented to new HD cath placement tomorrow 8/26, then dialysis.     #Klebsiella positive UTI  Presented with UA positive for large Hgb, ketones, protein>300, small leukocytes, RBC/WBC >50, and  many bacteria. Urine culture positive for Klebsiella. Pt denies any dysuria or urinary symptoms. Adequate sensitivities. Patient is getting treated with PO Levaquin for above, which will also cover for his UTI.     #ESRD on HD, TTS #Hypokalemia - resolved  #Hyponatremia  #Anemia of Chronic  Disease #Thrombocytopenia - improving #Hypotension- resolved  Patient presented with electrolyte abnormalities. Initially presented with potassium of 4.1 then decreased to 3.1, platelets of 125, and sodium of 128. Patient was unable to tolerate dialysis 8/21, ended the session after 1 hour due to rigors. Treatment duration was only 52 minutes.  Last session prior to that was on Saturday, which he was also unable to complete due to chest pain. Per nephrology, he only had 2 dialysis sessions this entire month.   8/25: BCX negative at 3 days x3.  We will continue to monitor.  - HD catheter removed 8/22  - Repeat blood cultures with no growth in 2 hours. - IV Aranesp every Saturday  - Daily CBC, BMP; replete electrolytes as necessary - Nephrology following   #COVID-19 infection Tested positive for COVID-19 in the ED. Patient remains afebrile and denies any respiratory symptoms - duonebs PRN for SOB/wheezing   #Type II diabetes mellitus - insulin dependent  #Proteinuria with ketones #History of diabetic nephropathy #Peripheral neuropathy #R BKA #L AKA Last A1c was 8.7 on 03/03/2022, history of non compliance with medications. Patient reports numbness of his bilateral hands, mostly on the fingertips L>R. Patient home insulin consist of Novolog 5 units TID and Lantus 15 units at bedtime.   8/25: Blood glucose: 166 (106-170 over last 24 hours).  No medication changes at this time.  Neuropathy in hands likely more related to poor perfusion in conjunction with diabetic neuropathy. - Message nursing to get patient an upper glove, keep hands comfortable. - Semglee 15 units at bedtime with sliding scale insulin  - Continue CBG checks - Pregabalin 50 mg daily and Cymbalta 60 mg daily    #History of HFrEF No signs of heart failure exacerbation on examination. Patient denies any shortness of breath or cough as this time. No rales or wheezing on lung exam. Last echo on 08/12/2021 demonstrated EF of 30  to 35%. GDMT limited to ESRD. Home medication includes Coreg 6.125 BID.  - Continue Coreg 6.125 BID as hypotension has resolved.    #History of hyperlipidemia  #History of PAD - Continue Rosuvastatin 10 mg daily    #History of tobacco use  Home medication includes Varenicline 0.5 mg daily. Denies using any tobacco since July, however, upon speaking with Sister, notes that he is a heavy smoker and was trying to leave ED yesterday to smoke outside.  - Nicotine transdermal patch   Prior to Admission Living Arrangement: Home Anticipated Discharge Location: Home  Barriers to Discharge: HD cath placement, HD Dispo: Anticipated discharge in approximately 1-2 day(s).   Tomie China, MD 08/29/2022, 3:51 PM Pager: [phone] After 5pm on weekdays and 1pm on weekends: On Call pager [phone]

## 2022-08-29 NOTE — Progress Notes (Signed)
Subjective: Seen in room COVID precautions, denies SOB, chest pain, or N/V.  Last HD 08/25/22,.  PermCath DC 2/2 sepsis /IR plans  PermCath tomorrow  Objective Vital signs in last 24 hours: Vitals:   08/28/22 1634 08/28/22 2114 08/29/22 0557 08/29/22 0902  BP: 123/72 135/70 (!) 169/82 (!) 163/61  Pulse: (!) 102 90 85 87  Resp: 20 20  17   Temp: 98.5 F (36.9 C) 99 F (37.2 C) 98.4 F (36.9 C) 98.3 F (36.8 C)  TempSrc: Oral Oral Oral Oral  SpO2: 99% 99% 100% 100%  Weight:      Height:       Weight change:   Physical Exam: General:  alert chronically ll appearing man, room air,NAD  Heart: RRR; no murmur Lungs: CTAB, nonlabored breathing 100% O2 sat room air Abdomen: NABS soft NT, ND  Extremities: L AKA, R BKA - no stump edema Dialysis Access: R IJ site of prior [facility] dressing dry clean     Dialysis Orders: [facility]  4h   400/1.5   66kg  3K/2.5Ca bath   [facility] TDC   Heparin none - last OP HD 8/17, post wt 69.2kg - routinely comes off 1.5- 3kg over dry - hectorol 4 mcg IV three times per week - mircera 30 mcg IV q 4 wks, last 7/23, due 8/20 (yest, missed)   Assessment/Plan: Sepsis: + COVID and GNR bacteremia (Proteus + Enterobacter), Urine Cx = GNR./ TDC removed 8/22. On Cefepime for now. No HCAP on CXR. Plts falling, likely due to sepsis. AMS: Likely due to #1,  mild uremia  skipping op hd / MS  now baseline . ESRD: TTS schedule.  Noncompliant op hd -. S/p 1hr of HD last 8/21 - had to stop due to intense rigors. TDC removed 8/22.  K 3.9.  Euvolemic  not BUN 66 CR 5.86 (K was supplemented 8/21) CO2 22 acidosis RX with bicarb / IR place [facility] 8/26 then  HD 8/26 HTN/volume: No edema on exam, CXR clear., BP rising, carvedilol 6.25 Mg bid restarted yesterday 1 dose given  Anemia of ESRD: Hgb 10.4 <9.4 restarted sq Aranesp 100 mcg 8/24 Secondary HPTH: CorrCa ok, Phos 4.0. Restart VDRA with next HD also check PTH Nutrition: Alb 2.4, on supplement  T2DM HFrEF (30-35%) PAD s/p B  amputations  Lenny Pastel, PA-C [facility]per [phone] 08/29/2022,11:14 AM  LOS: 4 days   Labs: Basic Metabolic Panel: Recent Labs  Lab 08/27/22 0454 08/28/22 0608 08/29/22 0704  NA 129* 127* 131*  K 4.0 4.1 3.9  CL 99 98 100  CO2 19* 19* 22  GLUCOSE 167* 121* 176*  BUN 63* 65* 66*  CREATININE 6.03* 5.83* 5.86*  CALCIUM 7.3* 7.7* 8.1*  PHOS 4.3 3.5 4.0   Liver Function Tests: Recent Labs  Lab 08/24/22 1603 08/25/22 0140 08/26/22 2349 08/27/22 0454 08/28/22 0608 08/29/22 0704  AST 34 31  --   --   --   --   ALT 16 15  --   --   --   --   ALKPHOS 59 50  --   --   --   --   BILITOT 0.9 0.8  --   --   --   --   PROT 7.3 6.5  --   --   --   --   ALBUMIN 3.2* 2.8*   < > 2.2* 2.4* 2.4*   < > = values in this interval not displayed.   No results for  input(s): "LIPASE", "AMYLASE" in the last 168 hours. No results for input(s): "AMMONIA" in the last 168 hours. CBC: Recent Labs  Lab 08/24/22 1603 08/25/22 0140 08/26/22 0422 08/27/22 0454 08/28/22 0608 08/29/22 0704  WBC 14.2* 12.0* 12.9* 19.1* 16.2* 9.4  NEUTROABS 12.3*  --   --   --   --   --   HGB 11.4* 9.8* 10.7* 9.4* 10.1* 10.4*  HCT 34.5* 29.9* 32.4* 28.3* 31.6* 32.0*  MCV 89.8 90.3 90.8 88.2 90.3 89.9  PLT 125* 110* 66* 78* 101* 131*   Cardiac Enzymes: No results for input(s): "CKTOTAL", "CKMB", "CKMBINDEX", "TROPONINI" in the last 168 hours. CBG: Recent Labs  Lab 08/28/22 0727 08/28/22 1111 08/28/22 1636 08/28/22 2117 08/29/22 0719  GLUCAP 106* 152* 121* 166* 174*    Studies/Results: No results found. Medications:   (feeding supplement) PROSource Plus  30 mL Oral BID BM   acetaminophen  812.5 mg Oral Q6H   carvedilol  6.25 mg Oral BID WC   Chlorhexidine Gluconate Cloth  6 each Topical Q0600   darbepoetin (ARANESP) injection - DIALYSIS  100 mcg Subcutaneous Q Sat-1800   DULoxetine  60 mg Oral Daily   feeding supplement (NEPRO CARB STEADY)  237 mL Oral BID BM   heparin   5,000 Units Subcutaneous Q8H   insulin aspart  0-5 Units Subcutaneous QHS   insulin aspart  0-9 Units Subcutaneous TID WC   insulin glargine-yfgn  15 Units Subcutaneous QHS   nicotine  14 mg Transdermal Daily   pregabalin  50 mg Oral Daily   rosuvastatin  10 mg Oral Daily   sodium bicarbonate  1,300 mg Oral BID

## 2022-08-30 ENCOUNTER — Inpatient Hospital Stay (HOSPITAL_COMMUNITY): Payer: 59

## 2022-08-30 HISTORY — PX: IR US GUIDE VASC ACCESS RIGHT: IMG2390

## 2022-08-30 HISTORY — PX: IR FLUORO GUIDE CV LINE RIGHT: IMG2283

## 2022-08-30 LAB — BASIC METABOLIC PANEL
Anion gap: 11 (ref 5–15)
BUN: 70 mg/dL — ABNORMAL HIGH (ref 8–23)
CO2: 20 mmol/L — ABNORMAL LOW (ref 22–32)
Calcium: 7.9 mg/dL — ABNORMAL LOW (ref 8.9–10.3)
Chloride: 101 mmol/L (ref 98–111)
Creatinine, Ser: 5.69 mg/dL — ABNORMAL HIGH (ref 0.61–1.24)
GFR, Estimated: 11 mL/min — ABNORMAL LOW (ref >60)
Glucose, Bld: 124 mg/dL — ABNORMAL HIGH (ref 70–99)
Potassium: 4.2 mmol/L (ref 3.5–5.1)
Sodium: 132 mmol/L — ABNORMAL LOW (ref 135–145)

## 2022-08-30 LAB — CBC
HCT: 30.8 % — ABNORMAL LOW (ref 39.0–52.0)
Hemoglobin: 10 g/dL — ABNORMAL LOW (ref 13.0–17.0)
MCH: 30.2 pg (ref 26.0–34.0)
MCHC: 32.5 g/dL (ref 30.0–36.0)
MCV: 93.1 fL (ref 80.0–100.0)
Platelets: 147 10*3/uL — ABNORMAL LOW (ref 150–400)
RBC: 3.31 MIL/uL — ABNORMAL LOW (ref 4.22–5.81)
RDW: 13 % (ref 11.5–15.5)
WBC: 8 10*3/uL (ref 4.0–10.5)
nRBC: 0 % (ref 0.0–0.2)

## 2022-08-30 LAB — GLUCOSE, CAPILLARY
Glucose-Capillary: 113 mg/dL — ABNORMAL HIGH (ref 70–99)
Glucose-Capillary: 122 mg/dL — ABNORMAL HIGH (ref 70–99)
Glucose-Capillary: 153 mg/dL — ABNORMAL HIGH (ref 70–99)

## 2022-08-30 MED ORDER — LIDOCAINE-EPINEPHRINE 1 %-1:100000 IJ SOLN
20.0000 mL | Freq: Once | INTRAMUSCULAR | Status: AC
  Administered 2022-08-30: 10 mL
  Filled 2022-08-30 (×2): qty 20

## 2022-08-30 MED ORDER — HEPARIN SODIUM (PORCINE) 1000 UNIT/ML IJ SOLN
10.0000 mL | Freq: Once | INTRAMUSCULAR | Status: AC
  Administered 2022-08-30: 3200 [IU]
  Filled 2022-08-30: qty 10

## 2022-08-30 MED ORDER — FENTANYL CITRATE (PF) 100 MCG/2ML IJ SOLN
INTRAMUSCULAR | Status: AC
  Filled 2022-08-30: qty 2

## 2022-08-30 MED ORDER — MIDAZOLAM HCL 2 MG/2ML IJ SOLN
INTRAMUSCULAR | Status: AC | PRN
  Administered 2022-08-30: .5 mg via INTRAVENOUS
  Administered 2022-08-30: 1 mg via INTRAVENOUS

## 2022-08-30 MED ORDER — LIDOCAINE-EPINEPHRINE 1 %-1:100000 IJ SOLN
INTRAMUSCULAR | Status: AC
  Filled 2022-08-30: qty 1

## 2022-08-30 MED ORDER — FENTANYL CITRATE (PF) 100 MCG/2ML IJ SOLN
INTRAMUSCULAR | Status: AC | PRN
  Administered 2022-08-30: 25 ug via INTRAVENOUS

## 2022-08-30 MED ORDER — HEPARIN SODIUM (PORCINE) 1000 UNIT/ML IJ SOLN
INTRAMUSCULAR | Status: AC
  Filled 2022-08-30: qty 10

## 2022-08-30 MED ORDER — MIDAZOLAM HCL 2 MG/2ML IJ SOLN
INTRAMUSCULAR | Status: AC
  Filled 2022-08-30: qty 2

## 2022-08-30 MED ORDER — CEFAZOLIN SODIUM-DEXTROSE 2-4 GM/100ML-% IV SOLN
INTRAVENOUS | Status: AC
  Filled 2022-08-30: qty 100

## 2022-08-30 MED ORDER — CHLORHEXIDINE GLUCONATE CLOTH 2 % EX PADS
6.0000 | MEDICATED_PAD | Freq: Every day | CUTANEOUS | Status: DC
  Administered 2022-08-31 – 2022-09-01 (×2): 6 via TOPICAL

## 2022-08-30 MED ORDER — DOXERCALCIFEROL 4 MCG/2ML IV SOLN
4.0000 ug | INTRAVENOUS | Status: DC
  Administered 2022-08-31: 4 ug via INTRAVENOUS
  Filled 2022-08-30 (×3): qty 2

## 2022-08-30 MED ORDER — LEVOFLOXACIN 500 MG PO TABS
500.0000 mg | ORAL_TABLET | Freq: Once | ORAL | Status: AC
  Administered 2022-08-31: 500 mg via ORAL
  Filled 2022-08-30: qty 1

## 2022-08-30 MED ORDER — CEFAZOLIN SODIUM-DEXTROSE 2-4 GM/100ML-% IV SOLN
INTRAVENOUS | Status: AC | PRN
  Administered 2022-08-30: 2 g via INTRAVENOUS

## 2022-08-30 MED ORDER — HEPARIN SODIUM (PORCINE) 1000 UNIT/ML DIALYSIS
20.0000 [IU]/kg | INTRAMUSCULAR | Status: DC | PRN
  Administered 2022-08-31: 1300 [IU] via INTRAVENOUS_CENTRAL

## 2022-08-30 MED ORDER — GELATIN ABSORBABLE 12-7 MM EX MISC
CUTANEOUS | Status: AC
  Filled 2022-08-30: qty 1

## 2022-08-30 NOTE — Progress Notes (Signed)
HD#5 SUBJECTIVE:  Patient Summary: [name] is a 62 y.o. with past medical history of ESRD on hemodialysis, Tuesdays, Thursdays, Saturday, type 2 diabetes, hypertension, hyperlipidemia, PAD, HFrEF presented to ED with altered mental status at and admitted for acute metabolic encephalopathy and bacteremia .   Overnight Events: None  Interim History: Patient evaluated at bedside, laying comfortably.  Patient reports watery stools over the past couple of days. 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 Patient denies any chest pain, shortness of breath, abdominal pain, dysuria.  Patient reports no other acute concerns at this time.  OBJECTIVE:  Vital Signs: Vitals:   08/29/22 0902 08/29/22 1648 08/29/22 2043 08/30/22 0453  BP: (!) 163/61 (!) 120/56 134/71 134/72  Pulse: 87 92 81 78  Resp: 17 17    Temp: 98.3 F (36.8 C)  98 F (36.7 C) 98 F (36.7 C)  TempSrc: Oral     SpO2: 100% 100% 100% 100%  Weight:      Height:       Supplemental O2: Room Air SpO2: 100 % O2 Flow Rate (L/min): 2 L/min  Filed Weights   08/25/22 0045  Weight: 66 kg     Intake/Output Summary (Last 24 hours) at 08/30/2022 0649 Last data filed at 08/30/2022 0200 Gross per 24 hour  Intake 790 ml  Output 570 ml  Net 220 ml   Net IO Since Admission: 1,228.73 mL [08/30/22 0649]  Physical Exam: Physical Exam  Constitutional: well-appearing, laying comfortably, no acute distress Cardiovascular: regular rate and rhythm, no m/r/g Pulmonary/Chest: lungs are clear to auscultation.  Abdominal: soft, non-tender, non-distended, bowel sounds present MSK: Right below knee amputation, left above-knee amputation Neurological: +Numbness and pain at the DIP, PIP, MCP joints bilaterally. +muscle wasting bilateral hands Skin: warm and dry Psych: Normal mood and Affect  Patient Lines/Drains/Airways Status     Active Line/Drains/Airways     Name Placement date Placement time Site Days    Peripheral IV 08/24/22 20 G Anterior;Left;Proximal Forearm 08/24/22  1605  Forearm  6   Peripheral IV 08/24/22 18 G Proximal;Right;Posterior Forearm 08/24/22  --  Forearm  6             ASSESSMENT/PLAN:  Assessment: Principal Problem:   Acute metabolic encephalopathy Active Problems:   Acute cystitis without hematuria   COVID   Plan: #Gram Negative Bacteremia, Enterobacter cloacae, Proteus Mirabilis, and Serratia Fonticola  (2/2) HD catheter infection #Altered mental status, resolved #Leukocytosis  #Lactic Acidosis - Resolved  Presented with retrograde memory loss upon presentation, last memory was getting on the bus two days ago with his cousin. Initial presentation in ED was temp 101.1 and tachycardiac. He was oriented to self, day, and place  UA positive for many bacteria, proteinuria and large Hgb. LA 2.0, WBC 14.2, COVID positive. Code sepsis activated in ED, pt was given one dose vancomycin, Cefepime, and flagyl in ED. CXR negative for active disease. Blood cultures 8/20 grew serratia fonticola, enterobacter cloacae, and proteus mirabilis with adequate sensitivities. Bacteremia possibly secondary to dialysis catheter. Nephrology on board. Patient had HD catheter removed 8/22 by IR. Repeat blood cultures on 8/22 no growth. Currently nonfebrile. No acute concerns at this time.  - PO Levaquin 500 mg every 48 hours , last dose today - Planned HD placement today by IR   #Klebsiella positive UTI  Presented with UA positive for large Hgb, ketones, protein>300, small leukocytes, RBC/WBC >50, and many bacteria. Urine culture positive for Klebsiella. Pt denies any  dysuria or urinary symptoms. Adequate sensitivities. Patient is getting treated with PO Levaquin for above, which will also cover for his UTI.     #ESRD on HD, TTS #Hypokalemia - resolved  #Hyponatremia- improving #Anemia of Chronic Disease #Thrombocytopenia - improving   #Hypotension- resolved  Patient presented with  electrolyte abnormalities. Initially presented with potassium of 4.1 then decreased to 3.1, platelets of 125, and sodium of 128. Patient was unable to tolerate dialysis 8/21, ended the session after 1 hour due to rigors. Treatment duration was only 52 minutes.  Last session prior to that was on Saturday, which he was also unable to complete due to chest pain. Per nephrology, he only had 2 dialysis sessions this entire month.  - HD catheter removed 8/22  - Repeat blood cultures with no growth in 48 hours. - IV Aranesp received 8/24 - Daily CBC, BMP; replete electrolytes as necessary - Nephrology following   #COVID-19 infection Tested positive for COVID-19 in the ED on 8/20. Patient remains afebrile and denies any respiratory symptoms. Pt is out of quarantine window. Masks only for the next 5 days.  - duonebs PRN for SOB/wheezing   #Type II diabetes mellitus - insulin dependent  #Proteinuria with ketones #History of diabetic nephropathy #R BKA #L AKA Last A1c was 8.7 on 03/03/2022, history of non compliance with medications. Patient reports numbness and pain of his bilateral hands, mostly on the fingertips, postive for muscle wasting. Patient home insulin consist of Novolog 5 units TID and Lantus 15 units at bedtime.  - Semglee 15 units at bedtime with sliding scale insulin  - Continue CBG checks - Pregabalin 50 mg daily and Cymbalta 60 mg daily    #History of HFrEF No signs of heart failure exacerbation on examination. Patient denies any shortness of breath or cough as this time. No rales or wheezing on lung exam. Last echo on 08/12/2021 demonstrated EF of 30 to 35%. GDMT limited to ESRD. Home medication includes Coreg 6.125 BID.  - Continue Coreg 6.125 BID as hypotension has resolved.    #History of hyperlipidemia  #History of PAD - Continue Rosuvastatin 10 mg daily    #History of tobacco use  Home medication includes Varenicline 0.5 mg daily. Denies using any tobacco since July, however,  upon speaking with Sister, notes that he is a heavy smoker and was trying to leave ED yesterday to smoke outside.  - Nicotine transdermal patch   Best Practice: Diet: NPO IVF: Fluids: none VTE: heparin injection 5,000 Units Start: 08/24/22 2200 Code: Full AB: Levaquin  Therapy Recs: None, DME: none Family Contact: Mary, called and notified. DISPO: Anticipated discharge  1-2 days  to Home pending  HD placement .  Signature: Jeral Pinch, D.O.  Internal Medicine Resident, PGY-1 [facility]  Pager: [phone] 6:49 AM, 08/30/2022   Please contact the on call pager after 5 pm and on weekends at [phone].

## 2022-08-30 NOTE — Procedures (Signed)
Interventional Radiology Procedure Note  Procedure: Placement of a right IJ approach tunneled HD cath, 19cm tip to cuff.   Complications: None Findings: Stenotic right internal jugular  Tip at cavoatrial jxn  Recommendations:  - Ok to use - Do not submerge - Routine line care  - ok to advance diet per primary order  Signed,  Yvone Neu. Loreta Ave, DO

## 2022-08-30 NOTE — TOC CM/SW Note (Signed)
Transition of Care [facility]) - Inpatient Brief Assessment   Patient Details  Name: [name] MRN: [id number] Date of Birth: [birth year]  Transition of Care [facility]) CM/SW Contact:    Tom-Johnson, Hershal Coria, RN Phone Number: 08/30/2022, 5:53 PM   Clinical Narrative:  Patient presented to ED with Altered Mental Status,  admitted for Acute Metabolic Encephalopathy and Bacteremia. Found to be Covid positive. On IV abx. Has hx of ESRD, Has outpatient Dialysis on TTS schedule. Nephrology following. Uses [employer] transportation to and from Dialysis and appointments.   From home alone, has three supportive children. Has Bilateral BKA with prosthesis.   PCP is Morrie Sheldon, MD and uses [facility].   Patient not Medically ready for discharge.  CM will continue to follow as patient progresses with care towards discharge.         Transition of Care Asessment: Insurance and Status: Insurance coverage has been reviewed Patient has primary care physician: Yes Home environment has been reviewed: Yes Prior level of function:: Modified Assistance, Bilateral BKA with wheelchair Prior/Current Home Services: No current home services Social Determinants of Health Reivew: SDOH reviewed no interventions necessary Readmission risk has been reviewed: Yes Transition of care needs: transition of care needs identified, TOC will continue to follow

## 2022-08-30 NOTE — Progress Notes (Signed)
Nephrology Progress Note:  Subjective: Last HD on 8/21 (partial tx per charting) with 100 mL UF.  He had 600 mL uop over 8/25 charted.  We discussed HD today and tomorrow as well if staffing permits and he is ok with HD again tomorrow.    Review of systems:  He reports previously had diarrhea - not today Denies n/v Denies shortness of breath or chest pain     Objective Vital signs in last 24 hours: Vitals:   08/29/22 1648 08/29/22 2043 08/30/22 0453 08/30/22 0829  BP: (!) 120/56 134/71 134/72 130/65  Pulse: 92 81 78 93  Resp: 17   18  Temp:  98 F (36.7 C) 98 F (36.7 C)   TempSrc:      SpO2: 100% 100% 100% 93%  Weight:      Height:       Weight change:   Physical Exam:   General adult male in bed in no acute distress HEENT normocephalic atraumatic extraocular movements intact sclera anicteric Neck supple trachea midline Lungs no audible crackles and normal work of breathing   Heart regular rate  Abdomen soft nontender nondistended Extremities no edema residual limbs Psych normal mood and affect Access none presently         Dialysis Orders: [facility]  4h   400/1.5   66kg  3K/2.5Ca bath   [facility] TDC   Heparin none - last OP HD 8/17, post wt 69.2kg - routinely comes off 1.5- 3kg over dry - hectorol 4 mcg IV three times per week - mircera 30 mcg IV q 4 wks, last 7/23, due 8/20 (yest, missed)   Assessment/Plan: Sepsis: + COVID and GNR bacteremia (Proteus + Enterobacter), Urine Cx = GNR./ TDC removed 8/22.  Abx per primary team.  See no further doses ordered and would have a low threshold to treat for 14 days rather than 7 days.  AMS: Likely due to #1,  mild uremia  skipping op hd / MS  now baseline . ESRD: TTS schedule.  Noncompliant op hd. S/p 1hr of HD last 8/21 - had to stop due to intense rigors. TDC removed 8/22.   He is to have a tunneled HD catheter placed today with IR  HD today then per TTS schedule  Labs from today are pending  HTN/volume: controlled on  current regimen   Anemia of ESRD: on Aranesp 100 mcg every Sat  Secondary HPTH: phos acceptable.  Restart hectorol TTS with HD T2DM - per primary team  HFrEF (30-35%) - optimize volume status with HD PAD s/p B amputations  Disposition - per primary team     Labs: Basic Metabolic Panel: Recent Labs  Lab 08/27/22 0454 08/28/22 0608 08/29/22 0704  NA 129* 127* 131*  K 4.0 4.1 3.9  CL 99 98 100  CO2 19* 19* 22  GLUCOSE 167* 121* 176*  BUN 63* 65* 66*  CREATININE 6.03* 5.83* 5.86*  CALCIUM 7.3* 7.7* 8.1*  PHOS 4.3 3.5 4.0   Liver Function Tests: Recent Labs  Lab 08/24/22 1603 08/25/22 0140 08/26/22 2349 08/27/22 0454 08/28/22 0608 08/29/22 0704  AST 34 31  --   --   --   --   ALT 16 15  --   --   --   --   ALKPHOS 59 50  --   --   --   --   BILITOT 0.9 0.8  --   --   --   --   PROT 7.3 6.5  --   --   --   --  ALBUMIN 3.2* 2.8*   < > 2.2* 2.4* 2.4*   < > = values in this interval not displayed.   No results for input(s): "LIPASE", "AMYLASE" in the last 168 hours. No results for input(s): "AMMONIA" in the last 168 hours. CBC: Recent Labs  Lab 08/24/22 1603 08/25/22 0140 08/26/22 0422 08/27/22 0454 08/28/22 0608 08/29/22 0704  WBC 14.2* 12.0* 12.9* 19.1* 16.2* 9.4  NEUTROABS 12.3*  --   --   --   --   --   HGB 11.4* 9.8* 10.7* 9.4* 10.1* 10.4*  HCT 34.5* 29.9* 32.4* 28.3* 31.6* 32.0*  MCV 89.8 90.3 90.8 88.2 90.3 89.9  PLT 125* 110* 66* 78* 101* 131*   Cardiac Enzymes: No results for input(s): "CKTOTAL", "CKMB", "CKMBINDEX", "TROPONINI" in the last 168 hours. CBG: Recent Labs  Lab 08/29/22 0719 08/29/22 1135 08/29/22 1646 08/29/22 2044 08/30/22 0723  GLUCAP 174* 114* 164* 103* 113*    Studies/Results: No results found. Medications:   (feeding supplement) PROSource Plus  30 mL Oral BID BM   acetaminophen  812.5 mg Oral Q6H   carvedilol  6.25 mg Oral BID WC   Chlorhexidine Gluconate Cloth  6 each Topical Q0600   darbepoetin (ARANESP) injection  - DIALYSIS  100 mcg Subcutaneous Q Sat-1800   DULoxetine  60 mg Oral Daily   feeding supplement (NEPRO CARB STEADY)  237 mL Oral BID BM   heparin  5,000 Units Subcutaneous Q8H   insulin aspart  0-5 Units Subcutaneous QHS   insulin aspart  0-9 Units Subcutaneous TID WC   insulin glargine-yfgn  15 Units Subcutaneous QHS   nicotine  14 mg Transdermal Daily   pregabalin  50 mg Oral Daily   rosuvastatin  10 mg Oral Daily   sodium bicarbonate  1,300 mg Oral BID     Estanislado Emms, MD 11:22 AM 08/30/2022

## 2022-08-31 DIAGNOSIS — N186 End stage renal disease: Secondary | ICD-10-CM | POA: Diagnosis not present

## 2022-08-31 DIAGNOSIS — B961 Klebsiella pneumoniae [K. pneumoniae] as the cause of diseases classified elsewhere: Secondary | ICD-10-CM | POA: Diagnosis not present

## 2022-08-31 DIAGNOSIS — G9341 Metabolic encephalopathy: Secondary | ICD-10-CM | POA: Diagnosis not present

## 2022-08-31 DIAGNOSIS — N390 Urinary tract infection, site not specified: Secondary | ICD-10-CM | POA: Diagnosis not present

## 2022-08-31 LAB — CBC
HCT: 31.7 % — ABNORMAL LOW (ref 39.0–52.0)
Hemoglobin: 10.2 g/dL — ABNORMAL LOW (ref 13.0–17.0)
MCH: 29.5 pg (ref 26.0–34.0)
MCHC: 32.2 g/dL (ref 30.0–36.0)
MCV: 91.6 fL (ref 80.0–100.0)
Platelets: 156 10*3/uL (ref 150–400)
RBC: 3.46 MIL/uL — ABNORMAL LOW (ref 4.22–5.81)
RDW: 12.8 % (ref 11.5–15.5)
WBC: 9.2 10*3/uL (ref 4.0–10.5)
nRBC: 0 % (ref 0.0–0.2)

## 2022-08-31 LAB — GLUCOSE, CAPILLARY
Glucose-Capillary: 141 mg/dL — ABNORMAL HIGH (ref 70–99)
Glucose-Capillary: 166 mg/dL — ABNORMAL HIGH (ref 70–99)
Glucose-Capillary: 143 mg/dL — ABNORMAL HIGH (ref 70–99)
Glucose-Capillary: 171 mg/dL — ABNORMAL HIGH (ref 70–99)

## 2022-08-31 LAB — PHOSPHORUS: Phosphorus: 2.4 mg/dL — ABNORMAL LOW (ref 2.5–4.6)

## 2022-08-31 LAB — BASIC METABOLIC PANEL
Anion gap: 8 (ref 5–15)
BUN: 26 mg/dL — ABNORMAL HIGH (ref 8–23)
CO2: 27 mmol/L (ref 22–32)
Calcium: 7.8 mg/dL — ABNORMAL LOW (ref 8.9–10.3)
Chloride: 96 mmol/L — ABNORMAL LOW (ref 98–111)
Creatinine, Ser: 3.34 mg/dL — ABNORMAL HIGH (ref 0.61–1.24)
GFR, Estimated: 20 mL/min — ABNORMAL LOW (ref >60)
Glucose, Bld: 144 mg/dL — ABNORMAL HIGH (ref 70–99)
Potassium: 3.7 mmol/L (ref 3.5–5.1)
Sodium: 131 mmol/L — ABNORMAL LOW (ref 135–145)

## 2022-08-31 LAB — MAGNESIUM: Magnesium: 1.6 mg/dL — ABNORMAL LOW (ref 1.7–2.4)

## 2022-08-31 MED ORDER — HEPARIN SODIUM (PORCINE) 1000 UNIT/ML DIALYSIS
20.0000 [IU]/kg | INTRAMUSCULAR | Status: DC | PRN
  Filled 2022-08-31 (×2): qty 2

## 2022-08-31 MED ORDER — HEPARIN SODIUM (PORCINE) 1000 UNIT/ML DIALYSIS
1000.0000 [IU] | INTRAMUSCULAR | Status: DC | PRN
  Administered 2022-09-02: 1000 [IU]
  Filled 2022-08-31: qty 1

## 2022-08-31 MED ORDER — HEPARIN SODIUM (PORCINE) 1000 UNIT/ML IJ SOLN
INTRAMUSCULAR | Status: AC
  Administered 2022-08-31: 1300 [IU] via INTRAVENOUS_CENTRAL
  Filled 2022-08-31: qty 4

## 2022-08-31 MED ORDER — ALTEPLASE 2 MG IJ SOLR: 2.0000 mg | Freq: Once | INTRAMUSCULAR | Status: DC | PRN

## 2022-08-31 MED ORDER — ANTICOAGULANT SODIUM CITRATE 4% (200MG/5ML) IV SOLN: 5.0000 mL | Status: DC | PRN

## 2022-08-31 NOTE — Progress Notes (Signed)
HD#6 SUBJECTIVE:  Patient Summary: [name] is a 62 y.o. with past medical history of ESRD on hemodialysis, Tuesdays, Thursdays, Saturday, type 2 diabetes, hypertension, hyperlipidemia, PAD, HFrEF presented to ED with altered mental status at and admitted for acute metabolic encephalopathy and bacteremia .    Overnight Events: None  Interim History: Pt evaluated at bedside, doing overall well. Patient had HD catheter placement yesterday and HD session last night where they removed 1500 mL fluids. Pt reports tolerating it well. No chest pain or shortness of breath. No abdominal pain. No more diarrheal episodes.  Patient denies any respiratory symptoms.   OBJECTIVE:  Vital Signs: Vitals:   08/31/22 1430 08/31/22 1445 08/31/22 1500 08/31/22 1516  BP: 129/61 126/67 118/75 120/62  Pulse: 78 75 77 77  Resp: 18 16 14 12   Temp:      TempSrc:      SpO2: 100% 95% 100% 100%  Weight:      Height:       Supplemental O2: Room Air SpO2: 100 % O2 Flow Rate (L/min): 2 L/min  Filed Weights   08/25/22 0045 08/31/22 0322  Weight: 66 kg 66.5 kg     Intake/Output Summary (Last 24 hours) at 08/31/2022 1522 Last data filed at 08/31/2022 1000 Gross per 24 hour  Intake 240 ml  Output 912 ml  Net -672 ml   Net IO Since Admission: 176.73 mL [08/31/22 1522]  Physical Exam: Physical Exam  Constitutional: laying comfortably, no acute distress Cardiovascular: regular rate and rhythm, no m/r/g Pulmonary/Chest: lungs are clear to auscultation.  No wheezing or rales. Abdominal: soft, non-tender, non-distended, bowel sounds present MSK: Right below knee amputation, left above-knee amputation Neurological: +Numbness and pain at the DIP, PIP, MCP joints bilaterally. +muscle wasting bilateral hands Skin: warm and dry Psych: Normal mood and Affect  Patient Lines/Drains/Airways Status     Active Line/Drains/Airways     Name Placement date Placement time Site Days   Peripheral IV 08/24/22 20 G  Anterior;Left;Proximal Forearm 08/24/22  1605  Forearm  7   Peripheral IV 08/24/22 18 G Proximal;Right;Posterior Forearm 08/24/22  --  Forearm  7   Hemodialysis Catheter Right Subclavian Double lumen Permanent (Tunneled) 08/30/22  1412  Subclavian  1             ASSESSMENT/PLAN:  Assessment: Principal Problem:   Acute metabolic encephalopathy Active Problems:   Acute cystitis without hematuria   COVID   Plan: #Gram Negative Bacteremia, Enterobacter cloacae, Proteus Mirabilis, and Serratia Fonticola  (2/2) HD catheter infection #Altered mental status, resolved #Leukocytosis resolved #Lactic Acidosis - Resolved  Presented with retrograde memory loss upon presentation, last memory was getting on the bus two days ago with his cousin. Initial presentation in ED was temp 101.1 and tachycardiac. He was oriented to self, day, and place  UA positive for many bacteria, proteinuria and large Hgb. LA 2.0, WBC 14.2, COVID positive. Code sepsis activated in ED, pt was given one dose vancomycin, Cefepime, and flagyl in ED. CXR negative for active disease. Blood cultures 8/20 grew serratia fonticola, enterobacter cloacae, and proteus mirabilis with adequate sensitivities. Bacteremia secondary to dialysis catheter. Nephrology on board. Patient had HD catheter removed 8/22 by IR. Repeat blood cultures on 8/22 no growth. Patient subsequently had an HD catheter placement by IR on 8/26, without complication.  Patient has received last dose p.o. Levaquin today.  Patient has completed 7-day course of antibiotics, confirmed by ID. -Pending SNF Placement   #Klebsiella positive UTI  Presented  with UA positive for large Hgb, ketones, protein>300, small leukocytes, RBC/WBC >50, and many bacteria. Urine culture positive for Klebsiella. Pt denies any dysuria or urinary symptoms. Adequate sensitivities. Patient is getting treated with PO Levaquin for above, which will also cover for his UTI.     #ESRD on HD,  TTS #Hypokalemia - resolved  #Hyponatremia- improving #Anemia of Chronic Disease #Thrombocytopenia - resolved    #Hypotension- resolved  Patient presented with electrolyte abnormalities. Initially presented with potassium of 4.1 then decreased to 3.1, platelets of 125, and sodium of 128. Patient was unable to tolerate dialysis 8/21, ended the session after 1 hour due to rigors. Treatment duration was only 52 minutes. Last session prior to that was on Saturday, which he was also unable to complete due to chest pain. Per nephrology, he only had 2 dialysis sessions this month of August. HD catheter removed 8/22; replaced on 8/26. Dialysis session 8/26 with net fluid removal 1500 mL, tolerated well. Patient was given IV Aranesp 8/24, Hgb today 10.2, PlT 156.    #COVID-19 infection Tested positive for COVID-19 in the ED on 8/20. Ordered duonebs PRN for SOB/wheezing. Patient remains afebrile and denies any respiratory symptoms. Pt is out of quarantine window. Masks only for the next 5 days.    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 Patient reports numbness and pain of his bilateral hands, mostly on the fingertips, postive for muscle wasting. Patient home insulin consist of Novolog 5 units TID and Lantus 15 units at bedtime. Pt was started on Semglee 15 units at bedtime with sliding scale insulin. Pt was also started on Pregabalin 50 mg daily and Cymbalta 60 mg daily for neuropathy of hands. PT/OT consulted for evaluation, pt wants SNF.    #History of HFrEF No signs of heart failure exacerbation on examination. Patient denies any shortness of breath or cough as this time. No rales or wheezing on lung exam. Last echo on 08/12/2021 demonstrated EF of 30 to 35%. GDMT limited to ESRD. Home medication includes Coreg 6.125  BID.    #History of hyperlipidemia  #History of PAD Continued Rosuvastatin 10 mg daily.     #History of tobacco use  Home medication includes Varenicline 0.5 mg daily. Denies using any tobacco since July, however, upon speaking with Sister, notes that he is a heavy smoker and was trying to leave ED to smoke outside. Nicotine transdermal patch.    Best Practice: Diet: Renal diet IVF: Fluids: None VTE: heparin injection 5,000 Units Start: 08/24/22 2200 Code: Full AB: None Therapy Recs: SNF, DME: none DISPO: Anticipated discharge  1-2 days  to Skilled nursing facility pending  SNF .  Signature: Jeral Pinch, D.O.  Internal Medicine Resident, PGY-1 [facility]  Pager: [phone] 3:22 PM, 08/31/2022   Please contact the on call pager after 5 pm and on weekends at [phone].

## 2022-08-31 NOTE — Discharge Summary (Incomplete)
Name: [name] MRN: [id number] DOB: 1960-11-03 62 y.o. PCP: Morrie Sheldon, MD  Date of Admission: 08/24/2022  3:30 PM Date of Discharge:  08/31/2022 Attending Physician: Dr. Criselda Peaches  DISCHARGE DIAGNOSIS:  Primary Problem: Acute metabolic encephalopathy   Hospital Problems: Principal Problem:   Acute metabolic encephalopathy Active Problems:   Acute cystitis without hematuria   COVID    DISCHARGE MEDICATIONS:   Allergies as of 08/31/2022       Reactions   Benicar [olmesartan] Cough     Med Rec must be completed prior to using this SMARTLINK***       DISPOSITION AND FOLLOW-UP:  Mr.Avary Vicary was discharged from [facility] in Stable condition. At the hospital follow up visit please address:  Gram Negative Bacteremia, Enterobacter cloacae, Proteus Mirabilis, and Serratia Fonticola (2/2) HD catheter infection. Altered mental status.  Repeat blood cultures were negative for any growth in the past 48 hours.  Patient is currently nonfebrile.  No leukocytosis.  Patient is alert and oriented x 4.  Hemodialysis catheter was removed on 22nd and replaced on 26th of August. Please continue to encourage proper hygiene and handling of hemodialysis catheter.   COVID-19: Patient tested positive on August 20th, patient is out of the 5-day quarantine window.  Patient is encouraged to wear masks for the next 5 days, last day August 30th.  Patient denies any respiratory symptoms. Klebsiella positive UTI: Patient was treated with p.o. Levaquin inpatient.  Patient has completed the course of antibiotics inpatient. Patient denies any urinary symptoms. Please repeat UA. ESRD on hemodialysis Tuesdays, Thursday, Saturday: After the placement of the new hemodialysis catheter on August 26th, patient received 2 dialysis sessions inpatient.  Please recheck sodium, potassium, platelets. Anemia of chronic disease: Patient was given IV Aranesp 8/24, hemoglobin stable at 10.2, platelet  156. Please continue to check hemoglobin at the clinic. Peripheral neuropathy: Patient is discharged on home medications pregabalin 25 mg daily and duloxetine 60 mg daily.  Please titrate medications as needed to control numbness and pain of his bilateral hands. Please keep in mind that this patient is ESRD as you titrate pregabalin or duloxetine. Tobacco use: Patient is discharged on home medications Chantix 0.5 mg daily for decreased tobacco cravings and added nicotine patch every 24 hours. Heart failure/HTN: Patient is discharged with home medication Coreg 6.25 mg 1 tablet twice daily.  Please monitor blood pressures in office. GDMT limited due to ESRD.   Follow-up Recommendations: Consults: None  Labs: Basic Metabolic Profile, CBC, Hemoglobin, and Urinalysis Studies: None Medications: Home medications that includes but not limited to aspirin 81 mg, Coreg 6.25 twice daily, Cymbalta 60 mg daily, Zetia 10 mg daily, Lyrica 25 mg daily, Crestor 10 mg daily, tramadol 50 mg as needed, Chantix 0.5 mg daily.  Follow-up Appointments:  Follow-up Information     Morrie Sheldon, MD Follow up on 09/17/2022.   Specialty: Internal Medicine Why: Hospital follow up at 10:15 AM Contact information: [address] [place] [address] [phone]                [facility] appointment September 17, 2022 at 10:15 AM Dr. Lynnae January COURSE:  #Gram Negative Bacteremia, Enterobacter cloacae, Proteus Mirabilis, and Serratia Fonticola  (2/2) HD catheter infection #Altered mental status, resolved #Leukocytosis resolved #Lactic Acidosis - Resolved  Presented with retrograde memory loss upon presentation, last memory was getting on the bus two days ago with his cousin. Initial presentation in ED was temp 101.1 and tachycardiac.  He was oriented to self, day, and place  UA positive for many bacteria, proteinuria and large Hgb. LA 2.0, WBC 14.2, COVID positive. Code sepsis activated in ED, pt was given one  dose vancomycin, Cefepime, and flagyl in ED. CXR negative for active disease. Blood cultures 8/20 grew serratia fonticola, enterobacter cloacae, and proteus mirabilis with adequate sensitivities. Bacteremia secondary to dialysis catheter. Nephrology on board. Patient had HD catheter removed 8/22 by IR. Repeat blood cultures on 8/22 no growth. Patient subsequently had an HD catheter placement by IR on 8/26, without complication.  Patient has received last dose p.o. Levaquin today.  Patient has completed 7-day course of antibiotics, confirmed by ID.  #Klebsiella positive UTI  Presented with UA positive for large Hgb, ketones, protein>300, small leukocytes, RBC/WBC >50, and many bacteria. Urine culture positive for Klebsiella. Pt denies any dysuria or urinary symptoms. Adequate sensitivities. Patient is getting treated with PO Levaquin for above, which will also cover for his UTI.     #ESRD on HD, TTS #Hypokalemia - resolved  #Hyponatremia- improving #Anemia of Chronic Disease #Thrombocytopenia - resolved    #Hypotension- resolved  Patient presented with electrolyte abnormalities. Initially presented with potassium of 4.1 then decreased to 3.1, platelets of 125, and sodium of 128. Patient was unable to tolerate dialysis 8/21, ended the session after 1 hour due to rigors. Treatment duration was only 52 minutes. Last session prior to that was on Saturday, which he was also unable to complete due to chest pain. Per nephrology, he only had 2 dialysis sessions this month of August. HD catheter removed 8/22; replaced on 8/26. Dialysis session 8/26 with net fluid removal 1500 mL, tolerated well. Patient was given IV Aranesp 8/24, Hgb today 10.2, PlT 156.    #COVID-19 infection Tested positive for COVID-19 in the ED on 8/20. Ordered duonebs PRN for SOB/wheezing. Patient remains afebrile and denies any respiratory symptoms. Pt is out of quarantine window. Masks only for the next 5 days.    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 Patient reports numbness and pain of his bilateral hands, mostly on the fingertips, postive for muscle wasting. Patient home insulin consist of Novolog 5 units TID and Lantus 15 units at bedtime. Pt was started on Semglee 15 units at bedtime with sliding scale insulin. Pt was also started on Pregabalin 50 mg daily and Cymbalta 60 mg daily for neuropathy of hands. PT/OT consulted for evaluation.   #History of HFrEF No signs of heart failure exacerbation on examination. Patient denies any shortness of breath or cough as this time. No rales or wheezing on lung exam. Last echo on 08/12/2021 demonstrated EF of 30 to 35%. GDMT limited to ESRD. Home medication includes Coreg 6.125 BID. Continued Coreg 6.125 BID as hypotension has resolved.    #History of hyperlipidemia  #History of PAD Continued Rosuvastatin 10 mg daily.     #History of tobacco use  Home medication includes Varenicline 0.5 mg daily. Denies using any tobacco since July, however, upon speaking with Sister, notes that he is a heavy smoker and was trying to leave ED to smoke outside. Nicotine transdermal patch.    DISCHARGE INSTRUCTIONS:    SUBJECTIVE:  Pt evaluated at bedside, doing overall well. Patient had HD catheter placement yesterday and HD session last night where they removed 1500 mL fluids. Pt reports  tolerating it well. No chest pain or shortness of breath. No abdominal pain. No more diarrheal episodes.  Patient denies any respiratory symptoms.    Discharge Vitals:   BP 133/62 (BP Location: Left Arm)   Pulse 73   Temp 98.4 F (36.9 C)   Resp 19   Ht 5\' 9"  (1.753 m)   Wt 66.5 kg   SpO2 96%   BMI 21.65 kg/m   OBJECTIVE:  Physical Exam   Constitutional: laying comfortably, no acute distress Cardiovascular:  regular rate and rhythm, no m/r/g Pulmonary/Chest: lungs are clear to auscultation.  No wheezing or rales. Abdominal: soft, non-tender, non-distended, bowel sounds present MSK: Right below knee amputation, left above-knee amputation Neurological: +Numbness and pain at the DIP, PIP, MCP joints bilaterally. +muscle wasting bilateral hands Skin: warm and dry Psych: Normal mood and Affect  Pertinent Labs, Studies, and Procedures:     Latest Ref Rng & Units 08/31/2022   10:13 AM 08/30/2022    9:24 AM 08/29/2022    7:04 AM  CBC  WBC 4.0 - 10.5 K/uL 9.2  8.0  9.4   Hemoglobin 13.0 - 17.0 g/dL 95.6  21.3  08.6   Hematocrit 39.0 - 52.0 % 31.7  30.8  32.0   Platelets 150 - 400 K/uL 156  147  131        Latest Ref Rng & Units 08/31/2022   10:13 AM 08/30/2022    9:24 AM 08/29/2022    7:04 AM  CMP  Glucose 70 - 99 mg/dL 578  469  629   BUN 8 - 23 mg/dL 26  70  66   Creatinine 0.61 - 1.24 mg/dL 5.28  4.13  2.44   Sodium 135 - 145 mmol/L 131  132  131   Potassium 3.5 - 5.1 mmol/L 3.7  4.2  3.9   Chloride 98 - 111 mmol/L 96  101  100   CO2 22 - 32 mmol/L 27  20  22    Calcium 8.9 - 10.3 mg/dL 7.8  7.9  8.1     CT HEAD WO CONTRAST ( )  Result Date: 08/24/2022 CLINICAL DATA:  Head trauma EXAM: CT HEAD WITHOUT CONTRAST TECHNIQUE: Contiguous axial images were obtained from the base of the skull through the vertex without intravenous contrast. RADIATION DOSE REDUCTION: This exam was performed according to the departmental dose-optimization program which includes automated exposure control, adjustment of the mA and/or kV according to patient size and/or use of iterative reconstruction technique. COMPARISON:  None Available. FINDINGS: Brain: No evidence of acute infarction, hemorrhage, hydrocephalus, extra-axial collection or mass lesion/mass effect. Vascular: No hyperdense vessel or unexpected calcification. Skull: Normal. Negative for fracture or focal lesion. Sinuses/Orbits: There are air-fluid levels  throughout the paranasal sinuses diffusely. Mastoid air cells are clear. Orbits are within normal limits. Other: None. IMPRESSION: 1. No acute intracranial process. 2. Air-fluid levels throughout the paranasal sinuses worrisome for acute sinusitis. Electronically Signed   By: Darliss Cheney M.D.   On: 08/24/2022 18:46   DG Chest Portable 1 View  Result Date: 08/24/2022 CLINICAL DATA:  Fever and altered mental status EXAM: PORTABLE CHEST 1 VIEW COMPARISON:  None Available. FINDINGS: Artifact from EKG leads. Dialysis catheter with tip at the upper right atrium. There is no edema, consolidation, effusion, or pneumothorax. IMPRESSION: No evidence of active disease. Electronically Signed   By: Tiburcio Pea M.D.   On: 08/24/2022 17:18     Signed: Jeral Pinch, D.O.  Internal Medicine Resident, PGY-1 [facility]  Pager: [phone] 1:37 PM, 08/31/2022

## 2022-08-31 NOTE — TOC Progression Note (Signed)
Transition of Care [facility]) - Progression Note    Patient Details  Name: [name] MRN: [id number] Date of Birth: 1960/11/24  [facility] of [facility]) CM/SW Contact  Tom-Johnson, Hershal Coria, RN Phone Number: 08/31/2022, 3:32 PM  Clinical Narrative:     CM received a call from patient's daughter, [name] with concerns about patient returning home at discharge. States patient lives alone and has been falling frequently. Patient goes to outpatient dialysis and misses sessions d/t not being able to get to front of apartment for transportation to pick him up. States patient was approved for CAP through Medicaid but then denied d/t patient making $20 over income limit.  Home health recommended, CM spoke with patient about recommendation and also about daughter's concern. Patient states he will go to SNF for rehab. States he feels very weak at this time and needs to build up strength to function independently. MD, PT/OT and LCSW notified.   CM will continue to follow as patient progresses with care towards discharge.      Expected Discharge Plan and Services                                               Social Determinants of Health (SDOH) Interventions SDOH Screenings   Food Insecurity: No Food Insecurity (05/13/2022)  Housing: Patient Declined (05/13/2022)  Transportation Needs: No Transportation Needs (05/13/2022)  Utilities: Not At Risk (05/13/2022)  Depression (PHQ2-9): Low Risk  (02/01/2022)  Tobacco Use: High Risk (08/28/2022)    Readmission Risk Interventions    08/30/2022    5:52 PM 08/20/2021    3:13 PM  Readmission Risk Prevention Plan  Transportation Screening Complete Complete  PCP or Specialist Appt within 3-5 Days  Complete  HRI or Home Care Consult  Complete  Social Work Consult for Recovery Care Planning/Counseling  Complete  Palliative Care Screening  Not Applicable  Medication Review [facility]) Referral to Pharmacy Complete  PCP or Specialist  appointment within 3-5 days of discharge Complete   HRI or Home Care Consult Complete   SW Recovery Care/Counseling Consult Complete   Palliative Care Screening Not Applicable   Skilled Nursing Facility Not Applicable

## 2022-08-31 NOTE — Procedures (Signed)
HD Note:  Some information was entered later than the data was gathered due to patient care needs. The stated time with the data is accurate.  Patient received treatment at bedside.   Alert and oriented.   Informed consent signed and in chart.   Access used: Right upper chest HD catheter Access issues: None  Patient blood pressure did not support the 1500 ml goal.  Goal reduced to 1000 ml  TX duration: 3 hours  Alert, without acute distress.  Total UF removed: 3000 ml  Hand-off given to patient's nurse.    Evagelia Knack L. Dareen Piano, RN Kidney Dialysis Unit.

## 2022-08-31 NOTE — Evaluation (Signed)
Physical Therapy Evaluation Patient Details Name: [name] MRN: [id number] DOB: [birth year] Today's Date: 08/31/2022  History of Present Illness  [name] is a 62 year old male with history of ESRD on hemodialysis, type 2 diabetes mellitus, hypertension, HFrEF, hyperlipidemia, and PAD who presented to the ED with altered mental status. Found to be Covid +   Clinical Impression  Patient received in bed, initially eating lunch, then sleeping in bed. Requires extensive encouragement to participate. Reports he is so weak and reports dizziness with sitting up on side of bed, returning self to supine. Patient requires continued motivational cues to perform mobility. He needed min A for squat pivot to/from recliner back to bed. Patient will continue to benefit from skilled PT to improve strength and independence with transfers for safety at home.           If plan is discharge home, recommend the following: A little help with walking and/or transfers;A little help with bathing/dressing/bathroom;Help with stairs or ramp for entrance;Assistance with cooking/housework   Can travel by private vehicle    yes    Equipment Recommendations None recommended by PT  Recommendations for Other Services       Functional Status Assessment Patient has had a recent decline in their functional status and demonstrates the ability to make significant improvements in function in a reasonable and predictable amount of time.     Precautions / Restrictions Precautions Precautions: Fall Restrictions Weight Bearing Restrictions: No      Mobility  Bed Mobility Overal bed mobility: Modified Independent                  Transfers Overall transfer level: Needs assistance   Transfers: Bed to chair/wheelchair/BSC       Squat pivot transfers: Min assist     General transfer comment: patient limited by increased weakness since admission. He required min A to transfer bed><chair.     Ambulation/Gait               General Gait Details: not ambulatory at baseline  Stairs            Wheelchair Mobility     Tilt Bed    Modified Rankin (Stroke Patients Only)       Balance Overall balance assessment: Independent                                           Pertinent Vitals/Pain Pain Assessment Pain Assessment: No/denies pain    Home Living Family/patient expects to be discharged to:: Private residence Living Arrangements: Alone Available Help at Discharge: Family;Available PRN/intermittently Type of Home: House Home Access: Ramped entrance       Home Layout: One level Home Equipment: Cane - single point;Tub bench;Other (comment);Rolling Walker (2 wheels);Wheelchair - manual      Prior Function Prior Level of Function : Independent/Modified Independent             Mobility Comments: Generally has R prosthetic on and transfers to wheelchair per patient. ADLs Comments: Son assists with community mobility, provides supervision with showers.  Patient completes his own meal prep.     Extremity/Trunk Assessment   Upper Extremity Assessment Upper Extremity Assessment: Generalized weakness    Lower Extremity Assessment Lower Extremity Assessment: Generalized weakness    Cervical / Trunk Assessment Cervical / Trunk Assessment: Normal  Communication   Communication Communication: No apparent difficulties Cueing Techniques:  Verbal cues  Cognition Arousal: Alert Behavior During Therapy: WFL for tasks assessed/performed Overall Cognitive Status: Within Functional Limits for tasks assessed                                          General Comments      Exercises     Assessment/Plan    PT Assessment Patient needs continued PT services  PT Problem List Decreased strength;Decreased activity tolerance;Decreased mobility       PT Treatment Interventions Functional mobility training;Therapeutic  activities;Therapeutic exercise;Patient/family education    PT Goals (Current goals can be found in the Care Plan section)  Acute Rehab PT Goals Patient Stated Goal: to return home PT Goal Formulation: With patient Time For Goal Achievement: 09/04/22 Potential to Achieve Goals: Good    Frequency Min 1X/week     Co-evaluation               AM-PAC PT "6 Clicks" Mobility  Outcome Measure Help needed turning from your back to your side while in a flat bed without using bedrails?: A Little Help needed moving from lying on your back to sitting on the side of a flat bed without using bedrails?: A Little Help needed moving to and from a bed to a chair (including a wheelchair)?: A Little Help needed standing up from a chair using your arms (e.g., wheelchair or bedside chair)?: A Lot Help needed to walk in hospital room?: Total Help needed climbing 3-5 steps with a railing? : Total 6 Click Score: 13    End of Session   Activity Tolerance: Patient limited by fatigue;Patient limited by lethargy Patient left: in bed;with call bell/phone within reach Nurse Communication: Mobility status PT Visit Diagnosis: Muscle weakness (generalized) (M62.81);Other abnormalities of gait and mobility (R26.89)    Time: 1300-1330 PT Time Calculation (min) (ACUTE ONLY): 30 min   Charges:   PT Evaluation $PT Eval Moderate Complexity: 1 Mod PT Treatments $Therapeutic Activity: 8-22 mins PT General Charges $$ ACUTE PT VISIT: 1 Visit        Emiline Mancebo, PT, GCS 08/31/22,1:42 PM

## 2022-08-31 NOTE — [place] FL2 (Signed)
[place] MEDICAID FL2 LEVEL OF CARE FORM     IDENTIFICATION  Patient Name: [name] Birthdate: 09-08-1960 Sex: male Admission Date (Current Location): 08/24/2022  [place] and [place] Number:  Haynes Bast [id number] Facility and Address:  [facility], [address], [place], [place] [address]      Provider Number: [id number]  Attending Physician Name and Address:  Inez Catalina, MD  Relative Name and Phone Number:  Derry Skill [phone]    Current Level of Care: Hospital Recommended Level of Care: Skilled Nursing Facility Prior Approval Number:    Date Approved/Denied:   PASRR Number: [id number]  Discharge Plan: SNF    Current Diagnoses: Patient Active Problem List   Diagnosis Date Noted   Acute cystitis without hematuria 08/28/2022   COVID 08/28/2022   Acute metabolic encephalopathy 08/24/2022   Contamination of blood culture 05/17/2022   ESRD (end stage renal disease) (HCC) 05/16/2022   S/P AKA (above knee amputation) unilateral, left (HCC) 05/15/2022   Acute febrile illness 05/15/2022   Anemia associated with chronic renal failure 05/15/2022   Neuropathy 05/14/2022   ESRD needing dialysis (HCC) 05/13/2022   Hypokalemia 05/13/2022   Tobacco use disorder 03/04/2022   Incontinence of urine 09/23/2021   Atherosclerosis of native arteries of extremities with gangrene, left leg (HCC)    Type 2 diabetes mellitus with diabetic peripheral angiopathy and gangrene, with long-term current use of insulin (HCC)    Infection of amputation stump, left lower extremity (HCC) 08/10/2021   Non-STEMI (non-ST elevated myocardial infarction) (HCC) 05/22/2021   ESRD on dialysis (HCC)    Colon polyps 03/03/2021   Mild cognitive impairment with memory loss 02/03/2021   Hx of medication noncompliance 09/30/2020   S/P BKA (below knee amputation) unilateral, right (HCC) 08/17/2018   Pulmonary nodules 02/16/2018   Diabetic neuropathy (HCC)  05/30/2014   GERD (gastroesophageal reflux disease) 05/25/2013   Heart failure with reduced ejection fraction and diastolic dysfunction (HCC) 03/27/2013   Healthcare maintenance 02/15/2013   Hyponatremia 01/06/2012   PVD (peripheral vascular disease) (HCC) 12/31/2011   HLD (hyperlipidemia) 03/06/2008   Hypertension, essential 03/06/2008    Orientation RESPIRATION BLADDER Height & Weight     Self, Time, Situation, Place  Normal Continent Weight: 146 lb 9.7 oz (66.5 kg) Height:  5\' 9"  (175.3 cm)  BEHAVIORAL SYMPTOMS/MOOD NEUROLOGICAL BOWEL NUTRITION STATUS      Incontinent Diet (see discharge summary)  AMBULATORY STATUS COMMUNICATION OF NEEDS Skin   Total Care (wheelchair bound at baseline) Verbally Normal                       Personal Care Assistance Level of Assistance  Bathing, Feeding, Dressing Bathing Assistance: Limited assistance Feeding assistance: Independent Dressing Assistance: Limited assistance     Functional Limitations Info  Sight, Hearing, Speech Sight Info: Adequate Hearing Info: Adequate Speech Info: Adequate    SPECIAL CARE FACTORS FREQUENCY  PT (By licensed PT), OT (By licensed OT)     PT Frequency: 5x week OT Frequency: 5x week            Contractures Contractures Info: Not present    Additional Factors Info  Code Status, Allergies, Insulin Sliding Scale Code Status Info: full Allergies Info: Benicar (Olmesartan)   Insulin Sliding Scale Info: Novolog: see discharge summary       Current Medications (08/31/2022):  This is the current hospital active medication list Current Facility-Administered Medications  Medication Dose Route Frequency Provider Last Rate Last  Admin   (feeding supplement) PROSource Plus liquid 30 mL  30 mL Oral BID BM Julien Nordmann, PA-C   30 mL at 08/31/22 1347   acetaminophen (TYLENOL) tablet 812.5 mg  812.5 mg Oral Q6H Patel, Amar, DO   812.5 mg at 08/31/22 1348   alteplase (CATHFLO ACTIVASE) injection 2 mg   2 mg Intracatheter Once PRN Estanislado Emms, MD       anticoagulant sodium citrate solution 5 mL  5 mL Intracatheter PRN Estanislado Emms, MD       carvedilol (COREG) tablet 6.25 mg  6.25 mg Oral BID WC Tawkaliyar, Roya, DO   6.25 mg at 08/31/22 0907   Chlorhexidine Gluconate Cloth 2 % PADS 6 each  6 each Topical Q0600 Delano Metz, MD   6 each at 08/31/22 1348   Chlorhexidine Gluconate Cloth 2 % PADS 6 each  6 each Topical Q0600 Estanislado Emms, MD   6 each at 08/31/22 0908   Darbepoetin Alfa (ARANESP) injection 100 mcg  100 mcg Subcutaneous Q Sat-1800 Lenny Pastel, PA-C   100 mcg at 08/28/22 1814   doxercalciferol (HECTOROL) injection 4 mcg  4 mcg Intravenous Q T,Th,Sa-HD Estanislado Emms, MD   4 mcg at 08/31/22 1431   DULoxetine (CYMBALTA) DR capsule 60 mg  60 mg Oral Daily Morene Crocker, MD   60 mg at 08/31/22 0907   feeding supplement (NEPRO CARB STEADY) liquid 237 mL  237 mL Oral BID BM Lenny Pastel, PA-C   237 mL at 08/31/22 1349   heparin injection 1,000 Units  1,000 Units Intracatheter PRN Estanislado Emms, MD       heparin injection 1,300 Units  20 Units/kg Dialysis PRN Estanislado Emms, MD   1,300 Units at 08/31/22 1432   heparin injection 5,000 Units  5,000 Units Subcutaneous Q8H Gwenevere Abbot, MD   5,000 Units at 08/31/22 1348   heparin sodium (porcine) injection 1,000 Units  1,000 Units Intracatheter PRN Delano Metz, MD   1,000 Units at 08/25/22 2331   insulin aspart (novoLOG) injection 0-5 Units  0-5 Units Subcutaneous QHS Morrie Sheldon, MD       insulin aspart (novoLOG) injection 0-9 Units  0-9 Units Subcutaneous TID WC Morrie Sheldon, MD   1 Units at 08/31/22 1347   insulin glargine-yfgn (SEMGLEE) injection 15 Units  15 Units Subcutaneous QHS Tawkaliyar, Roya, DO   15 Units at 08/31/22 0258   ipratropium-albuterol (DUONEB) 0.5-2.5 (3) MG/3ML nebulizer solution 3 mL  3 mL Nebulization Q4H PRN Earl Lagos, MD       nicotine (NICODERM CQ - dosed in mg/24 hours) patch  14 mg  14 mg Transdermal Daily Tawkaliyar, Roya, DO   14 mg at 08/31/22 0907   Oral care mouth rinse  15 mL Mouth Rinse PRN Earl Lagos, MD       pregabalin (LYRICA) capsule 50 mg  50 mg Oral Daily Modena Slater, DO   50 mg at 08/31/22 6045   rosuvastatin (CRESTOR) tablet 10 mg  10 mg Oral Daily Tawkaliyar, Roya, DO   10 mg at 08/31/22 0907     Discharge Medications: Please see discharge summary for a list of discharge medications.  Relevant Imaging Results:  Relevant Lab Results:   Additional Information SSN [id number], HD on Tue, Thurs, Saturday.  Pt is also covid positive with test date of 08/24/22.  Lorri Frederick, LCSW

## 2022-08-31 NOTE — Progress Notes (Signed)
Nephrology Progress Note:  Subjective: Last HD on 8/26 with 1.5 kg UF per HD RN note.  He had 1.3 liters as well as one unmeasured urine void over 8/26 charted.  States he wants to know how long he should isolate for covid - asked that he speak with his team re: final recs.   Review of systems:   He reports previously had diarrhea  Denies n/v Denies shortness of breath or chest pain     Objective Vital signs in last 24 hours: Vitals:   08/31/22 0218 08/31/22 0322 08/31/22 0627 08/31/22 0839  BP: (!) 111/54  (!) 136/59 133/62  Pulse: (!) 44  79 73  Resp: 19  19 19   Temp: 98.4 F (36.9 C)  98.4 F (36.9 C)   TempSrc:      SpO2: 90%  100% 96%  Weight:  66.5 kg    Height:       Weight change:   Physical Exam:     General adult male in bed in no acute distress HEENT normocephalic atraumatic extraocular movements intact sclera anicteric Neck supple trachea midline Lungs clear to auscultation; normal work of breathing on room air  Heart regular rate  Abdomen soft nontender nondistended Extremities no edema residual limbs Psych normal mood and affect Access RIJ tunn catheter         Dialysis Orders: [facility]  4h   400/1.5   66kg  3K/2.5Ca bath   [facility] TDC   Heparin none - last OP HD 8/17, post wt 69.2kg - routinely comes off 1.5- 3kg over dry - hectorol 4 mcg IV three times per week - mircera 30 mcg IV q 4 wks, last 7/23, due 8/20 (yest, missed)   Assessment/Plan: Sepsis: + COVID and GNR bacteremia (Proteus + Enterobacter), Urine Cx = GNR./ TDC removed 8/22.  Abx per primary team and ID AMS: Likely due to #1,  mild uremia  skipping op hd / MS now baseline . ESRD: TTS schedule.  Noncompliant op hd per charting. TDC removed 8/22.  New tunn catheter placed on 8/26 with IR after line holiday  HD per TTS schedule  Labs from today are still pending  HD today here - discussed with team   HTN/volume: controlled on current regimen   Anemia of ESRD: on Aranesp 100 mcg every Sat   Secondary HPTH: phos acceptable.  Restarted hectorol TTS with HD T2DM - per primary team  HFrEF (30-35%) - optimize volume status with HD PAD s/p B amputations Metabolic acidosis - stop bicarb; back on HD after line holiday   Disposition - per primary team.  Team indicates he may be discharged today     Labs: Basic Metabolic Panel: Recent Labs  Lab 08/27/22 0454 08/28/22 0608 08/29/22 0704 08/30/22 0924  NA 129* 127* 131* 132*  K 4.0 4.1 3.9 4.2  CL 99 98 100 101  CO2 19* 19* 22 20*  GLUCOSE 167* 121* 176* 124*  BUN 63* 65* 66* 70*  CREATININE 6.03* 5.83* 5.86* 5.69*  CALCIUM 7.3* 7.7* 8.1* 7.9*  PHOS 4.3 3.5 4.0  --    Liver Function Tests: Recent Labs  Lab 08/24/22 1603 08/25/22 0140 08/26/22 2349 08/27/22 0454 08/28/22 0608 08/29/22 0704  AST 34 31  --   --   --   --   ALT 16 15  --   --   --   --   ALKPHOS 59 50  --   --   --   --  BILITOT 0.9 0.8  --   --   --   --   PROT 7.3 6.5  --   --   --   --   ALBUMIN 3.2* 2.8*   < > 2.2* 2.4* 2.4*   < > = values in this interval not displayed.   No results for input(s): "LIPASE", "AMYLASE" in the last 168 hours. No results for input(s): "AMMONIA" in the last 168 hours. CBC: Recent Labs  Lab 08/24/22 1603 08/25/22 0140 08/27/22 0454 08/28/22 0608 08/29/22 0704 08/30/22 0924 08/31/22 1013  WBC 14.2*   < > 19.1* 16.2* 9.4 8.0 9.2  NEUTROABS 12.3*  --   --   --   --   --   --   HGB 11.4*   < > 9.4* 10.1* 10.4* 10.0* 10.2*  HCT 34.5*   < > 28.3* 31.6* 32.0* 30.8* 31.7*  MCV 89.8   < > 88.2 90.3 89.9 93.1 91.6  PLT 125*   < > 78* 101* 131* 147* 156   < > = values in this interval not displayed.   Cardiac Enzymes: No results for input(s): "CKTOTAL", "CKMB", "CKMBINDEX", "TROPONINI" in the last 168 hours. CBG: Recent Labs  Lab 08/30/22 1129 08/30/22 1632 08/31/22 0244 08/31/22 0738 08/31/22 1110  GLUCAP 122* 153* 141* 166* 143*    Studies/Results: IR Fluoro Guide CV Line Right  Result Date:  08/30/2022 INDICATION: 62 year old male referred for tunneled hemodialysis catheter EXAM: TUNNELED CENTRAL VENOUS HEMODIALYSIS CATHETER PLACEMENT WITH ULTRASOUND AND FLUOROSCOPIC GUIDANCE MEDICATIONS: 2 g Ancef. The antibiotic was given in an appropriate time interval prior to skin puncture. ANESTHESIA/SEDATION: Moderate (conscious) sedation was employed during this procedure. A total of Versed 1.5 mg and Fentanyl 25 mcg was administered intravenously by the radiology nurse. Total intra-service moderate Sedation Time: 13 minutes. The patient's level of consciousness and vital signs were monitored continuously by radiology nursing throughout the procedure under my direct supervision. FLUOROSCOPY: Radiation Exposure Index (as provided by the fluoroscopic device): 1 mGy Kerma COMPLICATIONS: None PROCEDURE: Informed written consent was obtained from the patient after a discussion of the risks, benefits, and alternatives to treatment. Questions regarding the procedure were encouraged and answered. The right neck and chest were prepped with chlorhexidine in a sterile fashion, and a sterile drape was applied covering the operative field. Maximum barrier sterile technique with sterile gowns and gloves were used for the procedure. A timeout was performed prior to the initiation of the procedure. Ultrasound survey was performed. The right internal jugular vein was confirmed to be patent, with images stored and sent to PACS. Micropuncture kit was utilized to access the right internal jugular vein under direct, real-time ultrasound guidance after the overlying soft tissues were anesthetized with 1% lidocaine with epinephrine. Stab incision was made with 11 blade scalpel. Microwire was passed centrally. The microwire was then marked to measure appropriate internal catheter length. External tunneled length was estimated. A total tip to cuff length of 19 cm was selected. 035 guidewire was advanced to the level of the IVC. Skin and  subcutaneous tissues of chest wall below the clavicle were generously infiltrated with 1% lidocaine for local anesthesia. A small stab incision was made with 11 blade scalpel. The selected hemodialysis catheter was tunneled in a retrograde fashion from the anterior chest wall to the venotomy incision. Serial dilation was performed and then a peel-away sheath was placed. The catheter was then placed through the peel-away sheath with tips ultimately positioned within the superior aspect of the  right atrium. Final catheter positioning was confirmed and documented with a spot radiographic image. The catheter aspirates and flushes normally. The catheter was flushed with appropriate volume heparin dwells. The catheter exit site was secured with a 0-Prolene retention suture. Gel-Foam slurry was infused into the soft tissue tract. The venotomy incision was closed Derma bond and sterile dressing. Dressings were applied at the chest wall. Patient tolerated the procedure well and remained hemodynamically stable throughout. No complications were encountered and no significant blood loss encountered. IMPRESSION: Status post right IJ tunneled hemodialysis catheter. Signed, Yvone Neu. Miachel Roux, RPVI [facility] Radiology Electronically Signed   By: Gilmer Mor D.O.   On: 08/30/2022 15:32   IR US Guide Vasc Access Right  Result Date: 08/30/2022 INDICATION: 62 year old male referred for tunneled hemodialysis catheter EXAM: TUNNELED CENTRAL VENOUS HEMODIALYSIS CATHETER PLACEMENT WITH ULTRASOUND AND FLUOROSCOPIC GUIDANCE MEDICATIONS: 2 g Ancef. The antibiotic was given in an appropriate time interval prior to skin puncture. ANESTHESIA/SEDATION: Moderate (conscious) sedation was employed during this procedure. A total of Versed 1.5 mg and Fentanyl 25 mcg was administered intravenously by the radiology nurse. Total intra-service moderate Sedation Time: 13 minutes. The  patient's level of consciousness and vital signs were monitored continuously by radiology nursing throughout the procedure under my direct supervision. FLUOROSCOPY: Radiation Exposure Index (as provided by the fluoroscopic device): 1 mGy Kerma COMPLICATIONS: None PROCEDURE: Informed written consent was obtained from the patient after a discussion of the risks, benefits, and alternatives to treatment. Questions regarding the procedure were encouraged and answered. The right neck and chest were prepped with chlorhexidine in a sterile fashion, and a sterile drape was applied covering the operative field. Maximum barrier sterile technique with sterile gowns and gloves were used for the procedure. A timeout was performed prior to the initiation of the procedure. Ultrasound survey was performed. The right internal jugular vein was confirmed to be patent, with images stored and sent to PACS. Micropuncture kit was utilized to access the right internal jugular vein under direct, real-time ultrasound guidance after the overlying soft tissues were anesthetized with 1% lidocaine with epinephrine. Stab incision was made with 11 blade scalpel. Microwire was passed centrally. The microwire was then marked to measure appropriate internal catheter length. External tunneled length was estimated. A total tip to cuff length of 19 cm was selected. 035 guidewire was advanced to the level of the IVC. Skin and subcutaneous tissues of chest wall below the clavicle were generously infiltrated with 1% lidocaine for local anesthesia. A small stab incision was made with 11 blade scalpel. The selected hemodialysis catheter was tunneled in a retrograde fashion from the anterior chest wall to the venotomy incision. Serial dilation was performed and then a peel-away sheath was placed. The catheter was then placed through the peel-away sheath with tips ultimately positioned within the superior aspect of the right atrium. Final catheter positioning  was confirmed and documented with a spot radiographic image. The catheter aspirates and flushes normally. The catheter was flushed with appropriate volume heparin dwells. The catheter exit site was secured with a 0-Prolene retention suture. Gel-Foam slurry was infused into the soft tissue tract. The venotomy incision was closed Derma bond and sterile dressing. Dressings were applied at the chest wall. Patient tolerated the procedure well and remained hemodynamically stable throughout. No complications were encountered and no significant blood loss encountered. IMPRESSION: Status post right IJ tunneled hemodialysis catheter. Signed, Yvone Neu. Miachel Roux, RPVI [facility]  [facility] Radiology Electronically Signed   By: Gilmer Mor D.O.   On: 08/30/2022 15:32   Medications:  anticoagulant sodium citrate      (feeding supplement) PROSource Plus  30 mL Oral BID BM   acetaminophen  812.5 mg Oral Q6H   carvedilol  6.25 mg Oral BID WC   Chlorhexidine Gluconate Cloth  6 each Topical Q0600   Chlorhexidine Gluconate Cloth  6 each Topical Q0600   darbepoetin (ARANESP) injection - DIALYSIS  100 mcg Subcutaneous Q Sat-1800   doxercalciferol  4 mcg Intravenous Q T,Th,Sa-HD   DULoxetine  60 mg Oral Daily   feeding supplement (NEPRO CARB STEADY)  237 mL Oral BID BM   heparin  5,000 Units Subcutaneous Q8H   heparin sodium (porcine)       insulin aspart  0-5 Units Subcutaneous QHS   insulin aspart  0-9 Units Subcutaneous TID WC   insulin glargine-yfgn  15 Units Subcutaneous QHS   nicotine  14 mg Transdermal Daily   pregabalin  50 mg Oral Daily   rosuvastatin  10 mg Oral Daily   sodium bicarbonate  1,300 mg Oral BID     Estanislado Emms, MD 12:04 PM 08/31/2022

## 2022-08-31 NOTE — Progress Notes (Addendum)
   08/31/22 0100  Vitals  BP (!) 134/58  MAP (mmHg) 80  BP Location Right Arm  BP Method Automatic  Patient Position (if appropriate) Lying  Pulse Rate 75  Pulse Rate Source Monitor  ECG Heart Rate 74  Resp 18  Oxygen Therapy  SpO2 100 %  O2 Device Nasal Cannula  During Treatment Monitoring  Blood Flow Rate (mL/min) 400 mL/min  Arterial Pressure (mmHg) -170 mmHg  Venous Pressure (mmHg) 150 mmHg  TMP (mmHg) 5 mmHg  Ultrafiltration Rate (mL/min) 679 mL/min  Dialysate Flow Rate (mL/min) 300 ml/min  Dialysate Potassium Concentration 3  Dialysate Calcium Concentration 2.5  HD Safety Checks Performed Yes  Intra-Hemodialysis Comments Tx completed   Net fluid removed .last V/S 134/58 HR 75.temp 97.8 Report to Cathey Endow RN

## 2022-08-31 NOTE — Evaluation (Addendum)
Occupational Therapy Evaluation Patient Details Name: [name] MRN: [id number] DOB: 30-Sep-1960 Today's Date: 08/31/2022   History of Present Illness [name] is a 62 year old male with history of ESRD on hemodialysis, type 2 diabetes mellitus, hypertension, HFrEF, hyperlipidemia, and PAD who presented to the ED with altered mental status. Found to be Covid +   Clinical Impression   [name] was evaluated s/p the above admission list. He is mod I at baseline, he reports his cousin who works night shift lives with him. Upon evaluation the pt was limited by generalized weakness, fatigue and decreased activity tolerance. Overall he was mod I for bed mobility and GCA for lateral scoots along to EOB to simulate an OOB transfer. PT declined OOB this date due to fatigue. Due to the deficits listed below the pt also needs up to set up A for all ADLs in sitting including donning his R prosthetic. Pt will benefit from continued acute OT services and discharge to skilled inpatient follow up therapy, <3 hours/day for rehab prior to going home.         If plan is discharge home, recommend the following: A little help with walking and/or transfers;A little help with bathing/dressing/bathroom;Assistance with cooking/housework;Assist for transportation;Help with stairs or ramp for entrance    Functional Status Assessment  Patient has had a recent decline in their functional status and demonstrates the ability to make significant improvements in function in a reasonable and predictable amount of time.  Equipment Recommendations  None recommended by OT    Recommendations for Other Services       Precautions / Restrictions Precautions Precautions: Fall Precaution Comments: covid Restrictions Weight Bearing Restrictions: No      Mobility Bed Mobility Overal bed mobility: Modified Independent                  Transfers Overall transfer level: Needs assistance   Transfers: Bed to  chair/wheelchair/BSC            Lateral/Scoot Transfers: Contact guard assist General transfer comment: Lateral scoots on EOB to simulate OOB transfer, pt declined OOB at this time due to fatiuge      Balance Overall balance assessment: Needs assistance   Sitting balance-Leahy Scale: Normal                                     ADL either performed or assessed with clinical judgement   ADL Overall ADL's : Needs assistance/impaired Eating/Feeding: Independent;Sitting   Grooming: Set up;Sitting   Upper Body Bathing: Set up;Sitting   Lower Body Bathing: Set up;Sitting/lateral leans   Upper Body Dressing : Set up;Sitting   Lower Body Dressing: Set up;Sit to/from stand Lower Body Dressing Details (indicate cue type and reason): including prosthetic Toilet Transfer: Contact guard assist Toilet Transfer Details (indicate cue type and reason): simulated with lateral scoots Toileting- Clothing Manipulation and Hygiene: Set up;Sitting/lateral lean       Functional mobility during ADLs: Contact guard assist General ADL Comments: encouragement and increased time required     Vision Baseline Vision/History: 0 No visual deficits Vision Assessment?: No apparent visual deficits     Perception Perception: Not tested       Praxis Praxis: Not tested       Pertinent Vitals/Pain Pain Assessment Pain Assessment: No/denies pain     Extremity/Trunk Assessment Upper Extremity Assessment Upper Extremity Assessment: Generalized weakness   Lower Extremity Assessment Lower Extremity  Assessment: Generalized weakness   Cervical / Trunk Assessment Cervical / Trunk Assessment: Normal   Communication Communication Communication: No apparent difficulties Cueing Techniques: Verbal cues   Cognition Arousal: Alert Behavior During Therapy: WFL for tasks assessed/performed Overall Cognitive Status: Within Functional Limits for tasks assessed                                  General Comments: question effort given, pt reporting extreme fatigue     General Comments  VSS on RA, pt with complaints of being cold throughout    Exercises     Shoulder Instructions      Home Living Family/patient expects to be discharged to:: Private residence Living Arrangements: Other relatives (cousin lives with him) Available Help at Discharge: Family;Available PRN/intermittently Type of Home: House Home Access: Ramped entrance     Home Layout: One level     Bathroom Shower/Tub: Chief Strategy Officer: Standard     Home Equipment: Cane - single point;Tub bench;Other (comment);Rolling Walker (2 wheels);Wheelchair - manual   Additional Comments: Cousin works night shift      Prior Functioning/Environment Prior Level of Function : Independent/Modified Independent             Mobility Comments: Generally has R prosthetic on and transfers to wheelchair per patient. ADLs Comments: Son assists with community mobility, provides supervision with showers.  Patient completes his own meal prep.        OT Problem List: Decreased strength;Decreased range of motion;Decreased activity tolerance;Impaired balance (sitting and/or standing);Decreased safety awareness;Decreased knowledge of use of DME or AE;Decreased knowledge of precautions      OT Treatment/Interventions: Self-care/ADL training;Neuromuscular education;DME and/or AE instruction;Therapeutic activities;Patient/family education;Balance training    OT Goals(Current goals can be found in the care plan section) Acute Rehab OT Goals Patient Stated Goal: to go home OT Goal Formulation: With patient Time For Goal Achievement: 09/14/22 Potential to Achieve Goals: Good ADL Goals Pt Will Perform Lower Body Bathing: with modified independence;sitting/lateral leans;sit to/from stand Pt Will Perform Lower Body Dressing: with modified independence;sitting/lateral leans Pt Will Transfer to  Toilet: with modified independence;squat pivot transfer Pt Will Perform Toileting - Clothing Manipulation and hygiene: with modified independence  OT Frequency: Min 1X/week    Co-evaluation              AM-PAC OT "6 Clicks" Daily Activity     Outcome Measure Help from another person eating meals?: None Help from another person taking care of personal grooming?: A Little Help from another person toileting, which includes using toliet, bedpan, or urinal?: A Little Help from another person bathing (including washing, rinsing, drying)?: A Little Help from another person to put on and taking off regular upper body clothing?: A Little Help from another person to put on and taking off regular lower body clothing?: A Little 6 Click Score: 19   End of Session Nurse Communication: Mobility status  Activity Tolerance: Patient tolerated treatment well Patient left: in bed;with call bell/phone within reach  OT Visit Diagnosis: Unsteadiness on feet (R26.81);Other abnormalities of gait and mobility (R26.89);Muscle weakness (generalized) (M62.81)                Time: 0981-1914 OT Time Calculation (min): 17 min Charges:  OT General Charges $OT Visit: 1 Visit OT Evaluation $OT Eval Moderate Complexity: 1 Mod  Derenda Mis, OTR/L Acute Rehabilitation Services Office [phone] Secure Chat Communication Preferred  Donia Pounds 08/31/2022, 2:50 PM

## 2022-08-31 NOTE — TOC Initial Note (Signed)
Transition of Care [facility]) - Initial/Assessment Note    Patient Details  Name: [name] MRN: [id number] Date of Birth: [birth year]  [facility] CM/SW Contact:    Lorri Frederick, LCSW Phone Number: 08/31/2022, 3:55 PM  Clinical Narrative:     CSW informed pt agreeable to SNF.  Pt covid +, CSW brought Medicare choice document to room but spoke to pt by phone.  He confirms would like DC to SNF, permission given to send out referral.  Permission given to speak with sister [name], daughter Leonie Green, and both sons.  Pt from home, has a cousin who lives with him, no current services.  HD pt: TTS. Covid +, test date 8/20.  Referral sent out in hub for SNF.              Expected Discharge Plan: Skilled Nursing Facility Barriers to Discharge: Continued Medical Work up, SNF Pending bed offer, Other (must enter comment) (covid positive)   Patient Goals and CMS Choice Patient states their goals for this hospitalization and ongoing recovery are:: get back to normal CMS Medicare.gov Compare Post Acute Care list provided to:: Patient Choice offered to / list presented to : Patient      Expected Discharge Plan and Services In-house Referral: Clinical Social Work   Post Acute Care Choice: Skilled Nursing Facility Living arrangements for the past 2 months: Single Family Home                                      Prior Living Arrangements/Services Living arrangements for the past 2 months: Single Family Home Lives with:: Relatives (cousin lives with him) Patient language and need for interpreter reviewed:: Yes Do you feel safe going back to the place where you live?: Yes      Need for Family Participation in Patient Care: Yes (Comment) Care giver support system in place?: Yes (comment) Current home services: Other (comment) (none) Criminal Activity/Legal Involvement Pertinent to Current Situation/Hospitalization: No - Comment as needed  Activities of Daily  Living Home Assistive Devices/Equipment: Prosthesis ADL Screening (condition at time of admission) Patient's cognitive ability adequate to safely complete daily activities?: Yes Is the patient deaf or have difficulty hearing?: No Does the patient have difficulty seeing, even when wearing glasses/contacts?: No Does the patient have difficulty concentrating, remembering, or making decisions?: No Patient able to express need for assistance with ADLs?: Yes Does the patient have difficulty dressing or bathing?: Yes Independently performs ADLs?: No Communication: Independent Dressing (OT): Needs assistance Is this a change from baseline?: Pre-admission baseline Grooming: Needs assistance Is this a change from baseline?: Pre-admission baseline Feeding: Independent Bathing: Dependent Is this a change from baseline?: Pre-admission baseline Toileting: Dependent Is this a change from baseline?: Pre-admission baseline In/Out Bed: Dependent Is this a change from baseline?: Pre-admission baseline Walks in Home: Dependent Is this a change from baseline?: Pre-admission baseline Does the patient have difficulty walking or climbing stairs?: Yes Weakness of Legs: None Weakness of Arms/Hands: None  Permission Sought/Granted Permission sought to share information with : Family Supports Permission granted to share information with : Yes, Verbal Permission Granted  Share Information with NAME: sister [name], daughter Zachary George  Permission granted to share info w AGENCY: SNF        Emotional Assessment Appearance:: Appears stated age Attitude/Demeanor/Rapport: Engaged Affect (typically observed): Appropriate, Pleasant Orientation: : Oriented to Self, Oriented to Place, Oriented to  Time,  Oriented to Situation      Admission diagnosis:  Metabolic encephalopathy [G93.41] End stage renal disease on dialysis (HCC) [N18.6, Z99.2] Acute cystitis without hematuria [N30.00] Acute metabolic encephalopathy  [G93.41] COVID [U07.1] Patient Active Problem List   Diagnosis Date Noted   Acute cystitis without hematuria 08/28/2022   COVID 08/28/2022   Acute metabolic encephalopathy 08/24/2022   Contamination of blood culture 05/17/2022   ESRD (end stage renal disease) (HCC) 05/16/2022   S/P AKA (above knee amputation) unilateral, left (HCC) 05/15/2022   Acute febrile illness 05/15/2022   Anemia associated with chronic renal failure 05/15/2022   Neuropathy 05/14/2022   ESRD needing dialysis (HCC) 05/13/2022   Hypokalemia 05/13/2022   Tobacco use disorder 03/04/2022   Incontinence of urine 09/23/2021   Atherosclerosis of native arteries of extremities with gangrene, left leg (HCC)    Type 2 diabetes mellitus with diabetic peripheral angiopathy and gangrene, with long-term current use of insulin (HCC)    Infection of amputation stump, left lower extremity (HCC) 08/10/2021   Non-STEMI (non-ST elevated myocardial infarction) (HCC) 05/22/2021   ESRD on dialysis (HCC)    Colon polyps 03/03/2021   Mild cognitive impairment with memory loss 02/03/2021   Hx of medication noncompliance 09/30/2020   S/P BKA (below knee amputation) unilateral, right (HCC) 08/17/2018   Pulmonary nodules 02/16/2018   Diabetic neuropathy (HCC) 05/30/2014   GERD (gastroesophageal reflux disease) 05/25/2013   Heart failure with reduced ejection fraction and diastolic dysfunction (HCC) 03/27/2013   Healthcare maintenance 02/15/2013   Hyponatremia 01/06/2012   PVD (peripheral vascular disease) (HCC) 12/31/2011   HLD (hyperlipidemia) 03/06/2008   Hypertension, essential 03/06/2008   PCP:  Morrie Sheldon, MD Pharmacy:   [facility] [address] [place] [address] Phone: [phone] Fax: [phone]  [place] [facility] [address] [place] [address] Phone: [phone] Fax: [phone]  [facility] [address] [place] [address] Phone: [phone] Fax: [phone]  [id number] [place], [place] - [address] AT [facility] [address] [place] [address] Phone: [phone] Fax: [phone]     Social Determinants of Health (SDOH) Social History: SDOH Screenings   Food Insecurity: No Food Insecurity (05/13/2022)  Housing: Patient Declined (05/13/2022)  Transportation Needs: No Transportation Needs (05/13/2022)  Utilities: Not At Risk (05/13/2022)  Depression (PHQ2-9): Low Risk  (02/01/2022)  Tobacco Use: High Risk (08/28/2022)   SDOH Interventions: Transportation Interventions: Intervention Not Indicated, Inpatient TOC, Patient Resources (Friends/Family)   Readmission Risk Interventions    08/30/2022    5:52 PM 08/20/2021    3:13 PM  Readmission Risk Prevention Plan  Transportation Screening Complete Complete  PCP or Specialist Appt within 3-5 Days  Complete  HRI or Home Care Consult  Complete  Social Work Consult for Recovery Care Planning/Counseling  Complete  Palliative Care Screening  Not Applicable  Medication Review Oceanographer) Referral to Pharmacy Complete  PCP or Specialist appointment within 3-5 days of discharge Complete   HRI or Home Care Consult Complete   SW Recovery Care/Counseling Consult Complete   Palliative Care Screening Not Applicable   Skilled Nursing Facility Not Applicable

## 2022-08-31 NOTE — Progress Notes (Signed)
Pt receives out-pt HD at [facility] on TTS 11:45 chair time. This info was provided to CSW for d/c planning purposes. Will assist as needed.   Olivia Canter Renal Navigator [phone]

## 2022-08-31 NOTE — Discharge Instructions (Signed)
[name], [name] came to the hospital for confusion, fever, and infection of your blood.  We treated you with fluids and antibiotics.  We took out your hemodialysis catheter which was likely the source of your infection in your blood.  We replaced your hemodialysis catheter with a new catheter on 26th of August. You received two dialysis sessions here in the hospital.  For your ESRD on hemodialysis:  Please continue to go to your hemodialysis sessions on the respective days: -Tuesdays, Thursdays, Saturday -Please keep the hemodialysis catheter site clean  For your type 2 diabetes and neuropathy of your hands Please continue to take your home medications as prescribed -Duloxetine 60 mg, take 1 capsule by mouth daily -Pregabalin 25 mg, take 1 capsule by mouth daily -Insulin lispro, inject 5 units into the skin 3 times daily with meals -Please continue to check your blood sugars at home -Tramadol 50 mg, take 1 tablet by mouth every 12 hours as needed for pain -Diclofenac sodium gel, apply as needed for pain  For your high cholesterol, peripheral artery disease Please continue to take your home medication -Rosuvastatin 10 mg, take 1 tablet by mouth daily -Ezetimibe 10 mg, take 1 tablet by mouth daily  For your tobacco use Please continue to take your home medications as prescribed -Varenicline 0.5 mg, 1 tablet by mouth daily for decrease tobacco cravings -Nicotine patch 21 mg, 1 patch onto the skin every 24 hours  For your heart failure Please continue to take your home medication as prescribed -Coreg 6.25 mg, take 1 tablet by mouth 2 times daily with meals for high blood pressure  If you have any of these following symptoms, please call us or seek care at an emergency department: -Chest Pain -Difficulty Breathing -Worsening abdominal pain -Syncope (passing out) -Drooping of face -Slurred speech -Sudden weakness in your leg or arm -Fever -Chills -blood in the stool -dark black,  sticky stool  I am glad you are feeling better. It was a pleasure taking care for you. I wish a good recovery and good health!   Jeral Pinch, DO, PGY 1

## 2022-09-01 DIAGNOSIS — R7881 Bacteremia: Secondary | ICD-10-CM | POA: Diagnosis not present

## 2022-09-01 DIAGNOSIS — T8571XA Infection and inflammatory reaction due to peritoneal dialysis catheter, initial encounter: Secondary | ICD-10-CM | POA: Diagnosis not present

## 2022-09-01 LAB — GLUCOSE, CAPILLARY
Glucose-Capillary: 109 mg/dL — ABNORMAL HIGH (ref 70–99)
Glucose-Capillary: 181 mg/dL — ABNORMAL HIGH (ref 70–99)
Glucose-Capillary: 177 mg/dL — ABNORMAL HIGH (ref 70–99)
Glucose-Capillary: 253 mg/dL — ABNORMAL HIGH (ref 70–99)

## 2022-09-01 LAB — CULTURE, BLOOD (ROUTINE X 2)
Culture: NO GROWTH
Culture: NO GROWTH

## 2022-09-01 LAB — CBC
HCT: 32.2 % — ABNORMAL LOW (ref 39.0–52.0)
Hemoglobin: 10.4 g/dL — ABNORMAL LOW (ref 13.0–17.0)
MCH: 28.7 pg (ref 26.0–34.0)
MCHC: 32.3 g/dL (ref 30.0–36.0)
MCV: 88.7 fL (ref 80.0–100.0)
Platelets: 173 10*3/uL (ref 150–400)
RBC: 3.63 MIL/uL — ABNORMAL LOW (ref 4.22–5.81)
RDW: 12.9 % (ref 11.5–15.5)
WBC: 10.4 10*3/uL (ref 4.0–10.5)
nRBC: 0 % (ref 0.0–0.2)

## 2022-09-01 LAB — BASIC METABOLIC PANEL
Anion gap: 11 (ref 5–15)
BUN: 15 mg/dL (ref 8–23)
CO2: 27 mmol/L (ref 22–32)
Calcium: 8.1 mg/dL — ABNORMAL LOW (ref 8.9–10.3)
Chloride: 94 mmol/L — ABNORMAL LOW (ref 98–111)
Creatinine, Ser: 2.95 mg/dL — ABNORMAL HIGH (ref 0.61–1.24)
GFR, Estimated: 23 mL/min — ABNORMAL LOW (ref >60)
Glucose, Bld: 109 mg/dL — ABNORMAL HIGH (ref 70–99)
Potassium: 3.5 mmol/L (ref 3.5–5.1)
Sodium: 132 mmol/L — ABNORMAL LOW (ref 135–145)

## 2022-09-01 MED ORDER — CHLORHEXIDINE GLUCONATE CLOTH 2 % EX PADS
6.0000 | MEDICATED_PAD | Freq: Every day | CUTANEOUS | Status: DC
  Administered 2022-09-02: 6 via TOPICAL

## 2022-09-01 MED ORDER — LACTATED RINGERS IV BOLUS
500.0000 mL | Freq: Once | INTRAVENOUS | Status: AC
  Administered 2022-09-01: 500 mL via INTRAVENOUS

## 2022-09-01 NOTE — Progress Notes (Signed)
HD#7 SUBJECTIVE:  Patient Summary:[name] is a 62 y.o. with past medical history of ESRD on hemodialysis, Tuesdays, Thursdays, Saturday, type 2 diabetes, hypertension, hyperlipidemia, PAD, HFrEF presented to ED with altered mental status at and admitted for acute metabolic encephalopathy and bacteremia .    Overnight Events: None  Interim History: Patient is evaluated at bedside, laying comfortably.  PT at bedside.  Patient denies any chest pain, shortness of breath, abdominal pain, cough.  Patient has no acute concerns at this time.  Patient is notified that we are waiting on SNF.  Otherwise medically stable for discharge.  OBJECTIVE:  Vital Signs: Vitals:   08/31/22 1739 08/31/22 2125 09/01/22 0656 09/01/22 0901  BP: (!) 119/54 (!) 144/73  126/64  Pulse: 80 79 74 80  Resp: 20   20  Temp:  98.3 F (36.8 C) 98 F (36.7 C) 97.8 F (36.6 C)  TempSrc:   Oral   SpO2: 100% 100%  100%  Weight:      Height:       Supplemental O2: Room Air SpO2: 100 % O2 Flow Rate (L/min): 2 L/min  Filed Weights   08/25/22 0045 08/31/22 0322  Weight: 66 kg 66.5 kg     Intake/Output Summary (Last 24 hours) at 09/01/2022 1329 Last data filed at 09/01/2022 0900 Gross per 24 hour  Intake 480 ml  Output 1000 ml  Net -520 ml   Net IO Since Admission: -343.27 mL [09/01/22 1329]  Physical Exam: Physical Exam  Constitutional: laying comfortably, no acute distress Cardiovascular: regular rate and rhythm, no m/r/g Pulmonary/Chest: lungs are clear to auscultation.  No wheezing or rales. Abdominal: soft, non-tender, non-distended, bowel sounds present MSK: Right below knee amputation, left above-knee amputation Neurological: No focal deficits. +mild Tenderness bilateral hands and numbness, unchanged.  Psych: Normal mood and Affect  Patient Lines/Drains/Airways Status     Active Line/Drains/Airways     Name Placement date Placement time Site Days   Peripheral IV 08/24/22 20 G  Anterior;Left;Proximal Forearm 08/24/22  1605  Forearm  8   Peripheral IV 08/24/22 18 G Proximal;Right;Posterior Forearm 08/24/22  --  Forearm  8   Hemodialysis Catheter Right Subclavian Double lumen Permanent (Tunneled) 08/30/22  1412  Subclavian  2             ASSESSMENT/PLAN:  Assessment: Principal Problem:   Gram-negative bacteremia Active Problems:   Acute metabolic encephalopathy   Acute cystitis without hematuria   COVID   Plan: #Gram Negative Bacteremia, Enterobacter cloacae, Proteus Mirabilis, and Serratia Fonticola  (2/2) HD catheter infection #Altered mental status, resolved #Leukocytosis resolved #Lactic Acidosis - Resolved  Presented with retrograde memory loss upon presentation, last memory was getting on the bus two days ago with his cousin. Initial presentation in ED was temp 101.1 and tachycardiac. He was oriented to self, day, and place  UA positive for many bacteria, proteinuria and large Hgb. LA 2.0, WBC 14.2, COVID positive. Code sepsis activated in ED, pt was given one dose vancomycin, Cefepime, and flagyl in ED. CXR negative for active disease. Blood cultures 8/20 grew serratia fonticola, enterobacter cloacae, and proteus mirabilis with adequate sensitivities. Bacteremia secondary to dialysis catheter. Nephrology on board. Patient had HD catheter removed 8/22 by IR. Repeat blood cultures on 8/22 no growth. Patient subsequently had an HD catheter placement by IR on 8/26, without complication.  Patient has received last dose p.o. Levaquin 8/27.  Patient has completed 7-day course of antibiotics, confirmed by ID. -Pending SNF Placement   #  Klebsiella positive UTI  Presented with UA positive for large Hgb, ketones, protein>300, small leukocytes, RBC/WBC >50, and many bacteria. Urine culture positive for Klebsiella. Pt denies any dysuria or urinary symptoms. Adequate sensitivities. Patient is getting treated with PO Levaquin for above, which will also cover for his  UTI.     #ESRD on HD, TTS #Hypokalemia - resolved  #Hyponatremia- improving #Anemia of Chronic Disease #Thrombocytopenia - resolved    #Hypotension- resolved  Patient presented with electrolyte abnormalities. Initially presented with potassium of 4.1 then decreased to 3.1, platelets of 125, and sodium of 128. Patient was unable to tolerate dialysis 8/21, ended the session after 1 hour due to rigors. Treatment duration was only 52 minutes. Last session prior to that was on Saturday, which he was also unable to complete due to chest pain. Per nephrology, he only had 2 dialysis sessions this month of August. HD catheter removed 8/22; replaced on 8/26. Dialysis session 8/26 with net fluid removal 1500 mL, tolerated well. Dialysis on 8/27 Total UF removed: 3000 ml. Patient was given IV Aranesp 8/24, Hgb today 10.4, PLT 173.  PT working with patient today reported patient had orthostatic hypotension. Patient is given 500 mL bolus lactated Ringer's.  Will reevaluate.   #COVID-19 infection Tested positive for COVID-19 in the ED on 8/20. Ordered duonebs PRN for SOB/wheezing. Patient remains afebrile and denies any respiratory symptoms. Pt is out of quarantine window. Masks only for the next 5 days.    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 Patient reports numbness and pain of his bilateral hands, mostly on the fingertips, postive for muscle wasting. Patient home insulin consist of Novolog 5 units TID and Lantus 15 units at bedtime. Pt was started on Semglee 15 units at bedtime with sliding scale insulin. Pt was also started on Pregabalin 50 mg daily and Cymbalta 60 mg daily for neuropathy of hands.  PT OT working with patient.    #History of HFrEF No signs of heart failure exacerbation on examination. Patient denies  any shortness of breath or cough as this time. No rales or wheezing on lung exam. Last echo on 08/12/2021 demonstrated EF of 30 to 35%. GDMT limited to ESRD. Home medication includes Coreg 6.125 BID that has been continued here.   #History of hyperlipidemia  #History of PAD Continued Rosuvastatin 10 mg daily.     #History of tobacco use  Home medication includes Varenicline 0.5 mg daily. Denies using any tobacco since July, however, upon speaking with Sister, notes that he is a heavy smoker and was trying to leave ED to smoke outside. Nicotine transdermal patch.    Best Practice: Diet: Renal diet IVF: Fluids: None VTE: heparin injection 5,000 Units Start: 08/24/22 2200 Code: Full AB: None  Therapy Recs: SNF, DME: none Family Contact: Haywood Filler, called and notified. Daughter, Zachary George, did not pick up.  DISPO: Anticipated discharge  1-2 days  to Skilled nursing facility pending  medical management .  Signature: Jeral Pinch, D.O.  Internal Medicine Resident, PGY-1 [facility]  Pager: [phone] 1:29 PM, 09/01/2022   Please contact the on call pager after 5 pm and on weekends at [phone].

## 2022-09-01 NOTE — Progress Notes (Signed)
Occupational Therapy Treatment Patient Details Name: [name] MRN: [id number] DOB: Oct 27, 1960 Today's Date: 09/01/2022   History of present illness [name] is a 62 year old male who presented to the ED 8/20 with altered mental status. Found to be Covid + and UTI. PMH: ESRD on hemodialysis, type 2 diabetes mellitus, hypertension, HFrEF 30-35%, hyperlipidemia, L AKA, R BKA with prosthetic limb, tobacco use and PAD.   OT comments  Pt declined efforts for EOB and OOB mobility today due to onset of dizziness earlier and pt being anxious of re-experiencing the event despite taking safety measures. Pt demonstrated ability to don pants in bed with use of rolling but needed min A for thoroughness. Pt did demonstrate compliance with amputee exercises and reports he does them every day. MD messaged regarding need for new neoprene liner in prosthesis. OT to continue to progress pt as able, DC plans remain appropriate for SNF.       If plan is discharge home, recommend the following:  A little help with walking and/or transfers;A little help with bathing/dressing/bathroom;Assistance with cooking/housework;Assist for transportation;Help with stairs or ramp for entrance   Equipment Recommendations  None recommended by OT (defer)    Recommendations for Other Services      Precautions / Restrictions Precautions Precautions: Fall Precaution Comments: covid+ Required Braces or Orthoses: Other Brace Other Brace: R prosthetic in room, glove for L hand pain Restrictions Weight Bearing Restrictions: No       Mobility Bed Mobility Overal bed mobility: Needs Assistance Bed Mobility: Rolling Rolling: Supervision, Used rails         General bed mobility comments: used bed rails and features to pull himself up higher in the bed.    Transfers                   General transfer comment: declined despite encouragement     Balance                                            ADL either performed or assessed with clinical judgement   ADL                       Lower Body Dressing: Bed level;Minimal assistance Lower Body Dressing Details (indicate cue type and reason): donning pants, min A to pull them up in the back. Cues to prompt pt to roll in order to make getting sides and back easier. Donned compression sock to L AKA with setup               General ADL Comments: Declined mobility due to anxiety of getting dizzy again, despite encouragement and taking preventative measures pt declined transfer to chair    Extremity/Trunk Assessment              Vision       Perception     Praxis      Cognition Arousal: Alert Behavior During Therapy: Anxious Overall Cognitive Status: Within Functional Limits for tasks assessed                                 General Comments: Pt declining EOB or OOB mobility due to dizziness from earlier session with PT. Reports he does not want it to happen again despite encouragement and donning compression sock  Exercises Amputee Exercises Hip Extension: AROM, Left, 10 reps, Supine Knee Extension: AROM, Right, 10 reps, Supine    Shoulder Instructions       General Comments Bp 158/86 taken supine in bed, pt refusing mobility to EOB and OOB, unable to get an EOB BP. L compression sock donned during session. Neoprine liner of prosthesis tatherd, MD notified via epic chat.    Pertinent Vitals/ Pain       Pain Assessment Pain Assessment: No/denies pain  Home Living                                          Prior Functioning/Environment              Frequency  Min 1X/week        Progress Toward Goals  OT Goals(current goals can now be found in the care plan section)  Progress towards OT goals: Progressing toward goals  Acute Rehab OT Goals OT Goal Formulation: With patient Time For Goal Achievement: 09/14/22 Potential to Achieve Goals: Good   Plan      Co-evaluation                 AM-PAC OT "6 Clicks" Daily Activity     Outcome Measure   Help from another person eating meals?: None Help from another person taking care of personal grooming?: A Little Help from another person toileting, which includes using toliet, bedpan, or urinal?: A Little Help from another person bathing (including washing, rinsing, drying)?: A Little Help from another person to put on and taking off regular upper body clothing?: A Little Help from another person to put on and taking off regular lower body clothing?: A Little 6 Click Score: 19    End of Session    OT Visit Diagnosis: Unsteadiness on feet (R26.81);Other abnormalities of gait and mobility (R26.89);Muscle weakness (generalized) (M62.81)   Activity Tolerance Patient limited by lethargy   Patient Left in bed;with call bell/phone within reach   Nurse Communication Mobility status        Time: 1610-9604 OT Time Calculation (min): 27 min  Charges: OT General Charges $OT Visit: 1 Visit OT Treatments $Therapeutic Activity: 23-37 mins  09/01/2022  AB, OTR/L  Acute Rehabilitation Services  Office: [phone]   Tristan Schroeder 09/01/2022, 5:57 PM

## 2022-09-01 NOTE — Progress Notes (Signed)
Physical Therapy Treatment Patient Details Name: [name] MRN: [id number] DOB: 05-Aug-1960 Today's Date: 09/01/2022   History of Present Illness [name] is a 62 year old male who presented to the ED 8/20 with altered mental status. Found to be Covid + and UTI. PMH: ESRD on hemodialysis, type 2 diabetes mellitus, hypertension, HFrEF 30-35%, hyperlipidemia, L AKA, R BKA with prosthetic limb, tobacco use and PAD.    PT Comments  Pt received in supine, agreeable to therapy session with max encouragement, pt c/o fatigue and noted to be diaphoretic/clammy and pt demonstrates symptomatic orthostatic hypotension with x2 attempts at sitting EOB and while working on lateral seated scooting. Pt defers transfer OOB to chair due to symptoms or standing attempts with prosthetic limb, pt instructed on benefits of mobility and upright posture to improve BP stability/strength, pt bed placed in chair posture as compromise. RN/MD notified of pt orthostatic hypotension and pt/family agreeable to disposition update based on limited functional mobility progress, updated per discussion with care team and supervising PT Kristyn C. Pt continues to benefit from PT services to progress toward functional mobility goals.     09/01/22 1100  Vital Signs  Patient Position (if appropriate) Orthostatic Vitals  Orthostatic Lying   BP- Lying 138/50  Pulse- Lying 85  Orthostatic Sitting  BP- Sitting 111/63  Pulse- Sitting 84     If plan is discharge home, recommend the following: A little help with bathing/dressing/bathroom;Help with stairs or ramp for entrance;Assistance with cooking/housework;A lot of help with walking and/or transfers;Assist for transportation   Can travel by private vehicle     No  Equipment Recommendations  None recommended by PT;Other (comment) (TBD post-acute)    Recommendations for Other Services       Precautions / Restrictions Precautions Precautions: Fall Precaution Comments:  covid+ Required Braces or Orthoses: Other Brace Other Brace: R prosthetic in room, glove for L hand pain Restrictions Weight Bearing Restrictions: No     Mobility  Bed Mobility Overal bed mobility: Needs Assistance Bed Mobility: Rolling, Sidelying to Sit, Sit to Sidelying Rolling: Supervision, Used rails Sidelying to sit: Min assist, Used rails     Sit to sidelying: Contact guard assist General bed mobility comments: Pt needs max encouragement and increased time to initiate and perform tasks including rolling and bed mobility. Rolling x6 total reps with bed rail assist for bed linen change/pad adjustment    Transfers Overall transfer level: Needs assistance Equipment used: None Transfers: Bed to chair/wheelchair/BSC            Lateral/Scoot Transfers: Contact guard assist, Min assist General transfer comment: Lateral scoots on EOB to simulate OOB transfer, pt declined OOB at this time due to fatigue and noted to be orthostatic on second sitting trial (pt had to lay down and rest after first trial). Pt refusing to transfer to recliner despite encouragement/discussion.    Ambulation/Gait               General Gait Details: not ambulatory at baseline   Psychologist, counselling mobility:  (pt defers due to fatigue)   Tilt Bed    Modified Rankin (Stroke Patients Only)       Balance Overall balance assessment: Needs assistance Sitting-balance support: Bilateral upper extremity supported Sitting balance-Leahy Scale: Poor Sitting balance - Comments: pt initially requiring modA trunk support due to posterior LOB and progressed to close Supervision when using L bed rail and mattress  support with other UE. Postural control: Posterior lean     Standing balance comment: pt defers to attempt donning RLE prosthetic for standing trials due to fatigue.                            Cognition Arousal:  Alert Behavior During Therapy: WFL for tasks assessed/performed Overall Cognitive Status: Within Functional Limits for tasks assessed                                 General Comments: Pt reporting significant fatigue and bil hand pain limiting his progress, then asks how long till he gets well. Reinforced need to mobilize and benefits of mobility. MD entered room briefly during session and also encouraging him. Slow to initiate and perform tasks, pt with mild confusion/decreased insight. PTA asked him if he drinks alcohol daily given symptoms but he states he does not.        Exercises      General Comments General comments (skin integrity, edema, etc.): MD/nsg staff notified of pt feeling cold/clammy but normal temp, HR WFL. Pt with orthostatic hypotension with transfer from supine to sitting EOB. Encouraged OOB to chair to progress strength/body tolerance to upright position but pt refusing.      Pertinent Vitals/Pain Pain Assessment Pain Assessment: Faces Faces Pain Scale: Hurts little more Pain Location: bil hands Pain Descriptors / Indicators: Tingling, Guarding, Discomfort Pain Intervention(s): Limited activity within patient's tolerance, Monitored during session, Repositioned    Home Living                          Prior Function            PT Goals (current goals can now be found in the care plan section) Acute Rehab PT Goals Patient Stated Goal: to return home and "get better" (stronger) PT Goal Formulation: With patient Time For Goal Achievement: 09/04/22 Progress towards PT goals: Progressing toward goals    Frequency    Min 1X/week      PT Plan      Co-evaluation              AM-PAC PT "6 Clicks" Mobility   Outcome Measure  Help needed turning from your back to your side while in a flat bed without using bedrails?: A Little Help needed moving from lying on your back to sitting on the side of a flat bed without using  bedrails?: A Lot (mod cues, min/modA initially due to confusion vs fatigue) Help needed moving to and from a bed to a chair (including a wheelchair)?: A Lot Help needed standing up from a chair using your arms (e.g., wheelchair or bedside chair)?: Total Help needed to walk in hospital room?: Total Help needed climbing 3-5 steps with a railing? : Total 6 Click Score: 10    End of Session Equipment Utilized During Treatment:  (bed pads) Activity Tolerance: Patient limited by fatigue;Other (comment);Treatment limited secondary to medical complications (Comment) (orthostatic hypotension with transfer to sitting, pt diaphoretic/clammy but temp [facility]) Patient left: in bed;with call bell/phone within reach;with bed alarm set;Other (comment) (bed in chair posture) Nurse Communication: Mobility status;Other (comment) (MD notified of pt orthostatic hypotension) PT Visit Diagnosis: Muscle weakness (generalized) (M62.81);Other abnormalities of gait and mobility (R26.89)     Time: 1025-1059 PT Time Calculation (min) (ACUTE ONLY): 34 min  Charges:    $Therapeutic Activity: 23-37 mins PT General Charges $$ ACUTE PT VISIT: 1 Visit                     Shauntay Brunelli P., PTA Acute Rehabilitation Services Secure Chat Preferred 9a-5:30pm Office: [phone]    Dorathy Kinsman [facility] 09/01/2022, 11:22 AM

## 2022-09-01 NOTE — TOC Progression Note (Signed)
Transition of Care [facility]) - Progression Note    Patient Details  Name: [name] MRN: [id number] Date of Birth: 07-24-1960  [facility] CM/SW Contact  Eduard Roux, [place] Phone Number: 09/01/2022, 3:35 PM  Clinical Narrative:     CSW spoke with patient by phone- informed of bed offers. Patient was agreeable to [facility]. CSW confirmed w/ [facility] bed availability. SNF is aware of testing positive for covid 8/20 & patient receives out patient dialysis at [facility] in  [place], on T/TH/Sat.  Insurance auth for SNF pending reference # [id number].  TOC will continue to follow and assist with discharge planning.  Antony Blackbird, MSW, LCSW Clinical Social Worker    Expected Discharge Plan: Skilled Nursing Facility Barriers to Discharge: Continued Medical Work up, SNF Pending bed offer, Other (must enter comment) (covid positive)  Expected Discharge Plan and Services In-house Referral: Clinical Social Work   Post Acute Care Choice: Skilled Nursing Facility Living arrangements for the past 2 months: Single Family Home                                       Social Determinants of Health (SDOH) Interventions SDOH Screenings   Food Insecurity: No Food Insecurity (05/13/2022)  Housing: Patient Declined (05/13/2022)  Transportation Needs: No Transportation Needs (05/13/2022)  Utilities: Not At Risk (05/13/2022)  Depression (PHQ2-9): Low Risk  (02/01/2022)  Tobacco Use: High Risk (08/28/2022)    Readmission Risk Interventions    08/30/2022    5:52 PM 08/20/2021    3:13 PM  Readmission Risk Prevention Plan  Transportation Screening Complete Complete  PCP or Specialist Appt within 3-5 Days  Complete  HRI or Home Care Consult  Complete  Social Work Consult for Recovery Care Planning/Counseling  Complete  Palliative Care Screening  Not Applicable  Medication Review Oceanographer) Referral to Pharmacy Complete  PCP or Specialist appointment  within 3-5 days of discharge Complete   HRI or Home Care Consult Complete   SW Recovery Care/Counseling Consult Complete   Palliative Care Screening Not Applicable   Skilled Nursing Facility Not Applicable

## 2022-09-02 ENCOUNTER — Other Ambulatory Visit (HOSPITAL_COMMUNITY): Payer: Self-pay

## 2022-09-02 DIAGNOSIS — D631 Anemia in chronic kidney disease: Secondary | ICD-10-CM | POA: Diagnosis not present

## 2022-09-02 DIAGNOSIS — G4700 Insomnia, unspecified: Secondary | ICD-10-CM | POA: Diagnosis not present

## 2022-09-02 DIAGNOSIS — E8809 Other disorders of plasma-protein metabolism, not elsewhere classified: Secondary | ICD-10-CM | POA: Diagnosis not present

## 2022-09-02 DIAGNOSIS — N250 Renal osteodystrophy: Secondary | ICD-10-CM | POA: Diagnosis not present

## 2022-09-02 DIAGNOSIS — I132 Hypertensive heart and chronic kidney disease with heart failure and with stage 5 chronic kidney disease, or end stage renal disease: Secondary | ICD-10-CM | POA: Diagnosis not present

## 2022-09-02 DIAGNOSIS — Z95828 Presence of other vascular implants and grafts: Secondary | ICD-10-CM | POA: Diagnosis not present

## 2022-09-02 DIAGNOSIS — I2510 Atherosclerotic heart disease of native coronary artery without angina pectoris: Secondary | ICD-10-CM | POA: Diagnosis not present

## 2022-09-02 DIAGNOSIS — M6281 Muscle weakness (generalized): Secondary | ICD-10-CM | POA: Diagnosis not present

## 2022-09-02 DIAGNOSIS — E1140 Type 2 diabetes mellitus with diabetic neuropathy, unspecified: Secondary | ICD-10-CM | POA: Diagnosis not present

## 2022-09-02 DIAGNOSIS — N186 End stage renal disease: Secondary | ICD-10-CM | POA: Diagnosis not present

## 2022-09-02 DIAGNOSIS — F17210 Nicotine dependence, cigarettes, uncomplicated: Secondary | ICD-10-CM | POA: Diagnosis not present

## 2022-09-02 DIAGNOSIS — R531 Weakness: Secondary | ICD-10-CM | POA: Diagnosis not present

## 2022-09-02 DIAGNOSIS — I10 Essential (primary) hypertension: Secondary | ICD-10-CM | POA: Diagnosis not present

## 2022-09-02 DIAGNOSIS — E785 Hyperlipidemia, unspecified: Secondary | ICD-10-CM | POA: Diagnosis not present

## 2022-09-02 DIAGNOSIS — E1159 Type 2 diabetes mellitus with other circulatory complications: Secondary | ICD-10-CM | POA: Diagnosis not present

## 2022-09-02 DIAGNOSIS — U071 COVID-19: Secondary | ICD-10-CM | POA: Diagnosis not present

## 2022-09-02 DIAGNOSIS — G3184 Mild cognitive impairment, so stated: Secondary | ICD-10-CM | POA: Diagnosis not present

## 2022-09-02 DIAGNOSIS — I5040 Unspecified combined systolic (congestive) and diastolic (congestive) heart failure: Secondary | ICD-10-CM | POA: Diagnosis not present

## 2022-09-02 DIAGNOSIS — E441 Mild protein-calorie malnutrition: Secondary | ICD-10-CM | POA: Diagnosis not present

## 2022-09-02 DIAGNOSIS — E876 Hypokalemia: Secondary | ICD-10-CM | POA: Diagnosis not present

## 2022-09-02 DIAGNOSIS — Z794 Long term (current) use of insulin: Secondary | ICD-10-CM | POA: Diagnosis not present

## 2022-09-02 DIAGNOSIS — R7881 Bacteremia: Secondary | ICD-10-CM | POA: Diagnosis not present

## 2022-09-02 DIAGNOSIS — I739 Peripheral vascular disease, unspecified: Secondary | ICD-10-CM | POA: Diagnosis not present

## 2022-09-02 DIAGNOSIS — Z7401 Bed confinement status: Secondary | ICD-10-CM | POA: Diagnosis not present

## 2022-09-02 DIAGNOSIS — I5042 Chronic combined systolic (congestive) and diastolic (congestive) heart failure: Secondary | ICD-10-CM | POA: Diagnosis not present

## 2022-09-02 DIAGNOSIS — J309 Allergic rhinitis, unspecified: Secondary | ICD-10-CM | POA: Diagnosis not present

## 2022-09-02 DIAGNOSIS — E871 Hypo-osmolality and hyponatremia: Secondary | ICD-10-CM | POA: Diagnosis not present

## 2022-09-02 DIAGNOSIS — Z992 Dependence on renal dialysis: Secondary | ICD-10-CM | POA: Diagnosis not present

## 2022-09-02 DIAGNOSIS — I5020 Unspecified systolic (congestive) heart failure: Secondary | ICD-10-CM | POA: Diagnosis not present

## 2022-09-02 DIAGNOSIS — Z23 Encounter for immunization: Secondary | ICD-10-CM | POA: Diagnosis not present

## 2022-09-02 DIAGNOSIS — K219 Gastro-esophageal reflux disease without esophagitis: Secondary | ICD-10-CM | POA: Diagnosis not present

## 2022-09-02 DIAGNOSIS — N390 Urinary tract infection, site not specified: Secondary | ICD-10-CM | POA: Diagnosis not present

## 2022-09-02 LAB — RENAL FUNCTION PANEL
Albumin: 3 g/dL — ABNORMAL LOW (ref 3.5–5.0)
Anion gap: 12 (ref 5–15)
BUN: 17 mg/dL (ref 8–23)
CO2: 26 mmol/L (ref 22–32)
Calcium: 8 mg/dL — ABNORMAL LOW (ref 8.9–10.3)
Chloride: 95 mmol/L — ABNORMAL LOW (ref 98–111)
Creatinine, Ser: 3.14 mg/dL — ABNORMAL HIGH (ref 0.61–1.24)
GFR, Estimated: 22 mL/min — ABNORMAL LOW (ref >60)
Glucose, Bld: 172 mg/dL — ABNORMAL HIGH (ref 70–99)
Phosphorus: 3 mg/dL (ref 2.5–4.6)
Potassium: 3.4 mmol/L — ABNORMAL LOW (ref 3.5–5.1)
Sodium: 133 mmol/L — ABNORMAL LOW (ref 135–145)

## 2022-09-02 LAB — BASIC METABOLIC PANEL
Anion gap: 11 (ref 5–15)
BUN: 10 mg/dL (ref 8–23)
CO2: 28 mmol/L (ref 22–32)
Calcium: 8 mg/dL — ABNORMAL LOW (ref 8.9–10.3)
Chloride: 93 mmol/L — ABNORMAL LOW (ref 98–111)
Creatinine, Ser: 1.81 mg/dL — ABNORMAL HIGH (ref 0.61–1.24)
GFR, Estimated: 42 mL/min — ABNORMAL LOW (ref >60)
Glucose, Bld: 147 mg/dL — ABNORMAL HIGH (ref 70–99)
Potassium: 3.1 mmol/L — ABNORMAL LOW (ref 3.5–5.1)
Sodium: 132 mmol/L — ABNORMAL LOW (ref 135–145)

## 2022-09-02 LAB — CBC
HCT: 34.4 % — ABNORMAL LOW (ref 39.0–52.0)
Hemoglobin: 11.2 g/dL — ABNORMAL LOW (ref 13.0–17.0)
MCH: 28.8 pg (ref 26.0–34.0)
MCHC: 32.6 g/dL (ref 30.0–36.0)
MCV: 88.4 fL (ref 80.0–100.0)
Platelets: 213 10*3/uL (ref 150–400)
RBC: 3.89 MIL/uL — ABNORMAL LOW (ref 4.22–5.81)
RDW: 13.6 % (ref 11.5–15.5)
WBC: 12.5 10*3/uL — ABNORMAL HIGH (ref 4.0–10.5)
nRBC: 0.2 % (ref 0.0–0.2)

## 2022-09-02 LAB — GLUCOSE, CAPILLARY
Glucose-Capillary: 169 mg/dL — ABNORMAL HIGH (ref 70–99)
Glucose-Capillary: 216 mg/dL — ABNORMAL HIGH (ref 70–99)
Glucose-Capillary: 147 mg/dL — ABNORMAL HIGH (ref 70–99)

## 2022-09-02 MED ORDER — PROSOURCE PLUS PO LIQD
30.0000 mL | Freq: Two times a day (BID) | ORAL | 0 refills | Status: DC
  Filled 2022-09-02: qty 1800, 30d supply, fill #0

## 2022-09-02 MED ORDER — POTASSIUM CHLORIDE CRYS ER 20 MEQ PO TBCR: 40.0000 meq | EXTENDED_RELEASE_TABLET | Freq: Once | ORAL | Status: DC

## 2022-09-02 MED ORDER — NEPRO/CARBSTEADY PO LIQD
237.0000 mL | Freq: Two times a day (BID) | ORAL | 0 refills | Status: DC
  Filled 2022-09-02: qty 14220, 30d supply, fill #0

## 2022-09-02 NOTE — TOC Progression Note (Addendum)
Transition of Care [facility]) - Progression Note    Patient Details  Name: [name] MRN: [id number] Date of Birth: [birth year]  Transition of Care [facility]) CM/SW Contact  Delilah Shan, LCSWA Phone Number: 09/02/2022, 11:15 AM  Clinical Narrative:      Patients insurance authorization has been approved. Plan Auth ID# [id number] Auth ID# [id number]. Insurance authorization has been approved from 8/28-8/30. CSW informed Facility and MD. Patient reports he transports to HD using [facility]. Information provided to Kia at [facility]. CSW awaiting to hear back from Kia with [facility] if patient can dc over today if medically ready .   Expected Discharge Plan: Skilled Nursing Facility Barriers to Discharge: Continued Medical Work up, SNF Pending bed offer, Other (must enter comment) (covid positive)  Expected Discharge Plan and Services In-house Referral: Clinical Social Work   Post Acute Care Choice: Skilled Nursing Facility Living arrangements for the past 2 months: Single Family Home                                       Social Determinants of Health (SDOH) Interventions SDOH Screenings   Food Insecurity: No Food Insecurity (05/13/2022)  Housing: Patient Declined (05/13/2022)  Transportation Needs: No Transportation Needs (05/13/2022)  Utilities: Not At Risk (05/13/2022)  Depression (PHQ2-9): Low Risk  (02/01/2022)  Tobacco Use: High Risk (08/28/2022)    Readmission Risk Interventions    08/30/2022    5:52 PM 08/20/2021    3:13 PM  Readmission Risk Prevention Plan  Transportation Screening Complete Complete  PCP or Specialist Appt within 3-5 Days  Complete  HRI or Home Care Consult  Complete  Social Work Consult for Recovery Care Planning/Counseling  Complete  Palliative Care Screening  Not Applicable  Medication Review Oceanographer) Referral to Pharmacy Complete  PCP or Specialist appointment within 3-5 days of discharge Complete   HRI or Home Care Consult Complete   SW Recovery  Care/Counseling Consult Complete   Palliative Care Screening Not Applicable   Skilled Nursing Facility Not Applicable

## 2022-09-02 NOTE — Progress Notes (Signed)
Advised by CSW that pt will d/c to snf today. Contacted [facility] to advise clinic of pt's d/c today and that pt should resume on Saturday. Clinic aware of pt's covid diagnosis. CSW advised to please advise snf that pt will need to enter HD clinic through side door at 11:30 due to covid diagnosis. HD appts added to AVS as well.   Olivia Canter Renal Navigator [phone]

## 2022-09-02 NOTE — Discharge Planning (Signed)
[facility] Patient Discharge Orders - [facility] CL[facility]IC: [place]  Patient's name: [name] Admit/DC Dates: 08/24/2022 - 09/02/2022  DISCHARGE DIAGNOSES: Sepsis COVID-19 (1st positive test 08/24/22) Proteus/Enterobacter/Serratia bacteremia -> felt due to #4, finished course of PO Levaquin Klebsiella UTI -> finished course of PO Levaquin ESRD  HD ORDER CHANGES: Heparin change: no EDW Change: no  Bath Change: no  ANEMIA MANAGEMENT: Aranesp: Given: yes   Amount/Date of last dose: Aranesp given 8/24 ESA dose for discharge: NONE - Hgb now > 11, resume per protocol IV Iron dose at discharge: per protocol Transfusion: Given: no  BONE/MINERAL MEDICATIONS: Hectorol/Calcitriol change: no Sensipar/Parsabiv change: no  ACCESS INTERVENTION/CHANGE: yes Details: Prior TDC removed 8/22, then line holiday, then new TDC placed 8/26 by IR   RECENT LABS: Recent Labs  Lab 08/29/22 0704 08/30/22 0924 08/31/22 1013 09/01/22 0554 09/02/22 0600  HGB 10.4*   < > 10.2*   < > 11.2*  NA 131*   < > 131*   < > 132*  K 3.9   < > 3.7   < > 3.1*  CALCIUM 8.1*   < > 7.8*   < > 8.0*  PHOS 4.0  --  2.4*  --   --   ALBUMIN 2.4*  --   --   --   --    < > = values in this interval not displayed.    IV ANTIBIOTICS: no Details:   OTHER ANTICOAGULATION: On Coumadin?: no  OTHER/APPTS/LAB ORDERS:  - D/c to SNF - no TCM call/visit needed  - Pls check K weekly for 1 mo - has been low sided here despite 3K bath  D/C Meds to be reconciled by nurse after every discharge.  Completed By:   Reviewed by: MD:______ RN_______

## 2022-09-02 NOTE — Discharge Summary (Signed)
Name: [name] MRN: [id number] DOB: [birth year] 62 y.o. PCP: Morrie Sheldon, MD  Date of Admission: 08/24/2022  3:30 PM Date of Discharge:  09/02/2022 Attending Physician: Dr. Criselda Peaches  DISCHARGE DIAGNOSIS:  Primary Problem: Gram-negative bacteremia   Hospital Problems: Principal Problem:   Gram-negative bacteremia Active Problems:   Acute metabolic encephalopathy   Acute cystitis without hematuria   COVID    DISCHARGE MEDICATIONS:   Allergies as of 09/02/2022       Reactions   Benicar [olmesartan] Cough        Medication List     STOP taking these medications    Darbepoetin Alfa 100 MCG/0.5ML Sosy injection Commonly known as: ARANESP       TAKE these medications    (feeding supplement) PROSource Plus liquid Take 30 mLs by mouth 2 (two) times daily between meals.   feeding supplement (NEPRO CARB STEADY) Liqd Take 237 mLs by mouth 2 (two) times daily between meals.   Accu-Chek Aviva Plus test strip Generic drug: glucose blood Check blood sugar 3 times a day   Accu-Chek FastClix Lancets Misc Check blood sugar 3 (three) times daily.   aspirin EC 81 MG tablet Take 1 tablet (81 mg total) by mouth daily. Swallow whole.   BD Pen Needle Nano U/F 32G X 4 MM Misc Generic drug: Insulin Pen Needle Use to inject insulin 4 (four) times daily.   carvedilol 6.25 MG tablet Commonly known as: COREG Take 1 tablet (6.25 mg total) by mouth 2 (two) times daily with meals for hypertension   diclofenac Sodium 1 % Gel Commonly known as: VOLTAREN Apply 4 g topically 4 (four) times daily.   DULoxetine 60 MG capsule Commonly known as: CYMBALTA Take 1 capsule (60 mg total) by mouth daily.   ezetimibe 10 MG tablet Commonly known as: ZETIA Take 1 tablet (10 mg total) by mouth daily.   fluticasone 50 MCG/ACT nasal spray Commonly known as: FLONASE Place 1 spray into both nostrils daily.   FreeStyle Libre 2 [employer] Use as directed every 14 days   FreeStyle  Libre 2 Sensor Misc Place 1 sensor on the skin every 14 days. Use to check glucose continuously   insulin lispro 100 UNIT/ML KwikPen Commonly known as: HUMALOG Inject 5 Units into the skin 3 (three) times daily with meals.   Lantus SoloStar 100 UNIT/ML Solostar Pen Generic drug: insulin glargine Inject 15 Units into the skin at bedtime.   melatonin 3 MG Tabs tablet Commonly known as: KP Melatonin Take 1 tablet (3 mg total) by mouth at bedtime for insomnia.   nicotine 21 mg/24hr patch Commonly known as: NICODERM CQ - dosed in mg/24 hours Place 1 patch (21 mg total) onto the skin daily.   nicotine polacrilex 2 MG gum Commonly known as: NICORETTE Take 1 each (2 mg total) by mouth as needed for smoking cessation.   pregabalin 25 MG capsule Commonly known as: LYRICA Take 1 capsule by mouth once a day   rosuvastatin 10 MG tablet Commonly known as: CRESTOR Take 1 tablet (10 mg total) by mouth daily.   traMADol 50 MG tablet Commonly known as: ULTRAM Take 1 tablet (50 mg total) by mouth every 12 (twelve) hours as needed for pain   varenicline 0.5 MG tablet Commonly known as: Chantix Take 1 tablet (0.5 mg total) by mouth daily.        DISPOSITION AND FOLLOW-UP:  [name] was discharged from [facility] in Stable condition. At the hospital  follow up visit please address:  Follow-up Recommendations: Labs: Comprehensive Metabolic Panel, Complete Blood Count Studies: none Medications: EPO was temporarily stopped as Hgb was 11.2. Consider restarting outpatient PRN.  Follow-up Appointments:  Contact information for follow-up providers     Morrie Sheldon, MD Follow up on 09/17/2022.   Specialty: Internal Medicine Why: Hospital follow up at 10:15 AM Contact information: [address] [place] [address] [phone]              Contact information for after-discharge care     Destination     [facility] Preferred SNF .    Service: Skilled Nursing Contact information: [address] [place] [address] [phone]                     HOSPITAL COURSE:  Patient Summary: #Gram Negative Bacteremia, Enterobacter cloacae, Proteus Mirabilis, and Serratia Fonticola  (2/2) HD catheter infection #Altered mental status, resolved #Leukocytosis resolved #Lactic Acidosis - Resolved  Presented with retrograde memory loss upon presentation, last memory was getting on the bus two days prior with his cousin. At the ED temp was 101.1 and tachycardiac. He was oriented to self, day, and place  UA positive for many bacteria, proteinuria and large Hgb. LA 2.0, WBC 14.2, COVID positive. Code sepsis activated in ED, pt was given one dose vancomycin, Cefepime, and flagyl in ED. CXR negative for active disease. Blood cultures 8/20 grew serratia fonticola, enterobacter cloacae, and proteus mirabilis with adequate sensitivities. His bacteremia was thought to be secondary to his dialysis catheter being infected. Nephrology was consulted. Patient had HD catheter removed on 8/22 by IR. Repeat blood cultures has had NGTD. A new HD catheter placement was done by IR on 8/26, without complication. The patient has completed 7-day course of Levaquin on 8/27.    #Klebsiella positive UTI  Presented with UA positive for large Hgb, ketones, protein>300, small leukocytes, RBC/WBC >50, and many bacteria. Urine culture positive for Klebsiella. Pt denies any dysuria or urinary symptoms. Adequate sensitivities. Patient was getting treated with PO Levaquin for above, which will also cover for his UTI.     #ESRD on HD, TTS #Hypokalemia - resolved  #Hyponatremia- improving #Anemia of Chronic Disease #Thrombocytopenia - resolved    #Hypotension- resolved  Patient presented with electrolyte abnormalities. Initially presented with potassium of 4.1 then decreased to 3.1, platelets of 125, and sodium of 128. Patient was unable to  tolerate dialysis 8/21, ended the session after 1 hour due to rigors. Treatment duration was only 52 minutes. Last session prior to that was on Saturday 8/24, which he was also unable to complete due to chest pain. Per nephrology, he only had 2 dialysis sessions this month of August. HD catheter removed 8/22; replaced on 8/26. Dialysis session 8/26 with net fluid removal 1500 mL, tolerated well. Patient was given IV Aranesp 8/24. Pt received last session of hemodialysis on 09/02/2022, Hgb today 11.2, PlT 213. He was slightly hypokalemic at 3.1 but labs were drawn while he was on dialysis. Please check his potassium during his post-discharge follow-up appointment. EPO was stopped since Hgb was 11.2 the day of the discharge. Labs taken during HD, will need to restart EPO PRN outpatient.   #COVID-19 infection Tested positive for COVID-19 in the ED on 8/20. Ordered duonebs PRN for SOB/wheezing. Patient remains afebrile and denies any respiratory symptoms. Pt is out of quarantine window. Masks only for the next 5 days until 8/30.   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 Patient reports numbness and pain of his bilateral hands, mostly on the fingertips, postive for muscle wasting. Patient home insulin consist of Novolog 5 units TID and Lantus 15 units at bedtime. Pt was started on Semglee 15 units at bedtime with sliding scale insulin. Pt was also started on Pregabalin 50 mg daily and Cymbalta 60 mg daily for neuropathy of hands. He can resume Voltaren gel up to four times daily on the hands for pain as well.    #History of HFrEF No signs of heart failure exacerbation on examination. Patient denies any shortness of breath or cough as this time. No rales or wheezing on lung exam. Last echo on 08/12/2021 demonstrated EF of 30  to 35%. GDMT limited to ESRD. Home medication includes Coreg 6.125 BID. He is to continue with Coreg at this dose.  #History of hyperlipidemia  #History of PAD Continue Rosuvastatin 10 mg daily.     #History of tobacco use  Home medication includes Varenicline 0.5 mg daily and a Nicotine transdermal patch. Denies using any tobacco since July, however, upon speaking with Sister, notes that he is a heavy smoker and was trying to leave ED to smoke outside. Continue with the Varenicline and Nicotine transdermal patch.    ------------------------------------------------------------------------------------------------------------------  Disposition  Gram Negative Bacteremia, Enterobacter cloacae, Proteus Mirabilis, and Serratia Fonticola (2/2) HD catheter infection. Altered mental status.  Repeat blood cultures were negative for any growth in the past 48 hours. Patient is currently nonfebrile. Patient is alert and oriented x 4.  Hemodialysis catheter was removed on 08/26/2022 and replaced on 08/30/2022.  Please continue to encourage proper hygiene and handling of hemodialysis catheter.   COVID-19: Patient tested positive on 08/24/2022, patient is out of the 5-day quarantine window.  Patient is encouraged to wear masks for the next 5 days, last day August 30.  Patient denies any respiratory symptoms. Klebsiella positive UTI: Patient was treated with p.o. Levaquin inpatient. Patient has completed the course of antibiotics inpatient.  Patient denies any urinary symptoms.   ESRD on hemodialysis Tuesdays, Thursday, Saturday: After the placement of the new hemodialysis catheter on 08/30/2022, patient received 3 dialysis sessions inpatient.  Please consider rechecking electrolytes and CBC with Diff upon discharge. Anemia of chronic disease: Patient was given IV Aranesp, hemoglobin stable at 11.2, platelet 213.  Please continue to check hemoglobin at the clinic. EPO has been temporarily stopped because of raise in  Hgb, but can consider re-starting it outpatient if needed.  Peripheral neuropathy: Patient is discharged on home medications pregabalin 25 mg daily and duloxetine 60 mg daily, as well as tramadol and voltaren gel.  Please titrate medications as needed to control numbness and pain of his bilateral hands.  Please keep in mind that this patient is ESRD as you titrate pregabalin or duloxetine. Tobacco use: Patient is discharged on home medications Chantix 0.5 mg daily to for decreased tobacco cravings and nicotine patches every 24 hours. Heart failure: Patient is discharged with home medication Coreg 6.25 mg 1 tablet twice daily.  Please monitor blood pressures in office.   DISCHARGE INSTRUCTIONS:   Mr. Mohsen, Helmer came to the hospital for confusion, fever, and infection of your blood.  We treated you with fluids and antibiotics.  We took out your hemodialysis catheter which was likely the source of your infection in  your blood. Please make sure you attend your hemodialysis visits. We replaced your hemodialysis catheter with a new catheter on 08/30/2022. You received three dialysis sessions here in the hospital.  For your ESRD on hemodialysis:  Please continue to go to your hemodialysis sessions on the respective days: -Tuesdays, Thursdays, Saturday -Please keep the hemodialysis catheter site clean  For your type 2 diabetes and neuropathy of your hands Please continue to take your home medications as prescribed -Duloxetine 60 mg, take 1 capsule by mouth daily -Pregabalin 25 mg, take 1 capsule by mouth daily -Insulin lispro, inject 5 units into the skin 3 times daily with meals -Please continue to check your blood sugars at home -Tramadol 50 mg, take 1 tablet by mouth every 12 hours as needed for pain -Diclofenac sodium gel, apply as needed for pain  For your high cholesterol, peripheral artery disease Please continue to take your home medication -Rosuvastatin 10 mg, take 1 tablet by mouth  daily -Ezetimibe 10 mg, take 1 tablet by mouth daily  For your tobacco use Please continue to take your home medications as prescribed -Varenicline 0.5 mg, 1 tablet by mouth daily for decrease tobacco cravings -Nicotine patch 21 mg, 1 patch onto the skin every 24 hours  For your heart failure Please continue to take your home medication as prescribed -Coreg 6.25 mg, take 1 tablet by mouth 2 times daily with meals for high blood pressure  If you have any of these following symptoms, please call us or seek care at an emergency department: -Chest Pain -Difficulty Breathing -Worsening abdominal pain -Syncope (passing out) -Drooping of face -Slurred speech -Sudden weakness in your leg or arm -Fever -Chills -blood in the stool -dark black, sticky stool  I am glad you are feeling better. It was a pleasure taking care for you. I wish a good recovery and good health.   Manuela Neptune, MD PGY-1   SUBJECTIVE:   Feeling well today, resting comfortably in bed with no acute concerns.   Discharge Vitals:   BP (!) 121/54 (BP Location: Right Arm)   Pulse 80   Temp 98 F (36.7 C) (Axillary)   Resp 18   Ht 5\' 9"  (1.753 m)   Wt 66.5 kg   SpO2 100%   BMI 21.65 kg/m   OBJECTIVE:  Physical Exam Constitutional:      General: He is not in acute distress.    Appearance: He is not ill-appearing or toxic-appearing.  HENT:     Mouth/Throat:     Mouth: Mucous membranes are moist.  Cardiovascular:     Rate and Rhythm: Normal rate and regular rhythm.     Heart sounds: S1 normal and S2 normal. Heart sounds not distant. No murmur heard.    No friction rub. No gallop.  Pulmonary:     Effort: No tachypnea, bradypnea or respiratory distress.     Breath sounds: No decreased breath sounds, wheezing, rhonchi or rales.  Abdominal:     General: Abdomen is flat. There is no distension.     Tenderness: There is no abdominal tenderness. There is no guarding.  Musculoskeletal:      Right hand: No swelling, deformity or tenderness. Normal range of motion. Normal pulse.     Left hand: No swelling, deformity or tenderness. Normal range of motion. Normal pulse.     Right lower leg: No edema.     Left lower leg: No edema.  Neurological:     Mental Status: He is alert.  Pertinent Labs, Studies, and Procedures:     Latest Ref Rng & Units 09/02/2022    6:00 AM 09/01/2022    5:54 AM 08/31/2022   10:13 AM  CBC  WBC 4.0 - 10.5 K/uL 12.5  10.4  9.2   Hemoglobin 13.0 - 17.0 g/dL 57.8  46.9  62.9   Hematocrit 39.0 - 52.0 % 34.4  32.2  31.7   Platelets 150 - 400 K/uL 213  173  156        Latest Ref Rng & Units 09/02/2022    6:00 AM 09/01/2022    5:54 AM 08/31/2022   10:13 AM  CMP  Glucose 70 - 99 mg/dL 528  413  244   BUN 8 - 23 mg/dL 10  15  26    Creatinine 0.61 - 1.24 mg/dL 0.10  2.72  5.36   Sodium 135 - 145 mmol/L 132  132  131   Potassium 3.5 - 5.1 mmol/L 3.1  3.5  3.7   Chloride 98 - 111 mmol/L 93  94  96   CO2 22 - 32 mmol/L 28  27  27    Calcium 8.9 - 10.3 mg/dL 8.0  8.1  7.8     CT HEAD WO CONTRAST ( )  Result Date: 08/24/2022 CLINICAL DATA:  Head trauma EXAM: CT HEAD WITHOUT CONTRAST TECHNIQUE: Contiguous axial images were obtained from the base of the skull through the vertex without intravenous contrast. RADIATION DOSE REDUCTION: This exam was performed according to the departmental dose-optimization program which includes automated exposure control, adjustment of the mA and/or kV according to patient size and/or use of iterative reconstruction technique. COMPARISON:  None Available. FINDINGS: Brain: No evidence of acute infarction, hemorrhage, hydrocephalus, extra-axial collection or mass lesion/mass effect. Vascular: No hyperdense vessel or unexpected calcification. Skull: Normal. Negative for fracture or focal lesion. Sinuses/Orbits: There are air-fluid levels throughout the paranasal sinuses diffusely. Mastoid air cells are clear. Orbits are within normal  limits. Other: None. IMPRESSION: 1. No acute intracranial process. 2. Air-fluid levels throughout the paranasal sinuses worrisome for acute sinusitis. Electronically Signed   By: Darliss Cheney M.D.   On: 08/24/2022 18:46   DG Chest Portable 1 View  Result Date: 08/24/2022 CLINICAL DATA:  Fever and altered mental status EXAM: PORTABLE CHEST 1 VIEW COMPARISON:  None Available. FINDINGS: Artifact from EKG leads. Dialysis catheter with tip at the upper right atrium. There is no edema, consolidation, effusion, or pneumothorax. IMPRESSION: No evidence of active disease. Electronically Signed   By: Tiburcio Pea M.D.   On: 08/24/2022 17:18     Signed: Manuela Neptune, MD Internal Medicine Resident, PGY-1 [facility]  Pager: [phone] 1:23 PM, 09/02/2022

## 2022-09-02 NOTE — Progress Notes (Signed)
   09/02/22 0651  Vitals  Temp 98 F (36.7 C)  Temp Source Axillary  BP 130/67  MAP (mmHg) 87  BP Location Right Arm  BP Method Automatic  Patient Position (if appropriate) Lying  Pulse Rate 75  Pulse Rate Source Monitor  ECG Heart Rate 78  Resp 18  Oxygen Therapy  O2 Device Room Air  During Treatment Monitoring  Intra-Hemodialysis Comments Tx completed  Post Treatment  Dialyzer Clearance Lightly streaked  Liters Processed 54  Fluid Removed (mL) 1400 mL  Tolerated HD Treatment Yes  Post-Hemodialysis Comments pt stable  Hemodialysis Catheter Right Subclavian Double lumen Permanent (Tunneled)  Placement Date/Time: 08/30/22 1412   Serial / Lot #: [id number]  Expiration Date: 04/04/27  Time Out: Correct patient;Correct site;Correct procedure  Maximum sterile barrier precautions: Hand hygiene;Cap;Mask;Sterile gown;Sterile gloves;Large sterile s...  Site Condition No complications  Blue Lumen Status Heparin locked  Red Lumen Status Heparin locked  Catheter fill solution Heparin 1000 units/ml  Catheter fill volume (Arterial) 1.9 cc  Catheter fill volume (Venous) 1.9  Dressing Type Transparent  Dressing Status Antimicrobial disc in place  Drainage Description None  Post treatment catheter status Capped and Clamped   Pt dialysis, blood pressure decrease during treatment. Goal decrease, labs draw and sent.Report given  to P. Geneticist, molecular.

## 2022-09-02 NOTE — TOC Transition Note (Addendum)
Transition of Care [facility]) - CM/SW Discharge Note   Patient Details  Name: [name] MRN: [id number] Date of Birth: February 03, 1960  [facility] CM/SW Contact:  Delilah Shan, LCSWA Phone Number: 09/02/2022, 2:05 PM   Clinical Narrative:     Patient will DC to: [facility]  Anticipated DC date: 09/02/2022  Family notified: Mary  Transport by: Sharin Mons  ?  Per MD patient ready for DC to [facility] . RN, patient, patient's family, [place] Renal Navigator,and facility notified of DC. Discharge Summary sent to facility. RN given number for report tele# [phone] RM# [phone]. DC packet on chart. Ambulance transport requested for patient  CSW advised snf that pt will need to enter HD clinic through side door at 11:30 due to covid diagnosis.   CSW signing off.   Final next level of care: Skilled Nursing Facility Barriers to Discharge: No Barriers Identified   Patient Goals and CMS Choice CMS Medicare.gov Compare Post Acute Care list provided to:: Patient Choice offered to / list presented to : Patient  Discharge Placement                Patient chooses bed at: [facility] Patient to be transferred to facility by: [facility] Name of family member notified: Mary Patient and family notified of of transfer: 09/02/22  Discharge Plan and Services Additional resources added to the After Visit Summary for   In-house Referral: Clinical Social Work   Post Acute Care Choice: Skilled Nursing Facility                               Social Determinants of Health (SDOH) Interventions SDOH Screenings   Food Insecurity: No Food Insecurity (05/13/2022)  Housing: Patient Declined (05/13/2022)  Transportation Needs: No Transportation Needs (05/13/2022)  Utilities: Not At Risk (05/13/2022)  Depression (PHQ2-9): Low Risk  (02/01/2022)  Tobacco Use: High Risk (08/28/2022)     Readmission Risk Interventions    08/30/2022    5:52 PM 08/20/2021    3:13 PM  Readmission Risk Prevention Plan   Transportation Screening Complete Complete  PCP or Specialist Appt within 3-5 Days  Complete  HRI or Home Care Consult  Complete  Social Work Consult for Recovery Care Planning/Counseling  Complete  Palliative Care Screening  Not Applicable  Medication Review Ocean[facility]) Referral to Pharmacy Complete  PCP or Specialist appointment within 3-5 days of discharge Complete   HRI or Home Care Consult Complete   SW Recovery Care/Counseling Consult Complete   Palliative Care Screening Not Applicable   Skilled Nursing Facility Not Applicable

## 2022-09-02 NOTE — Progress Notes (Signed)
Nephrology Progress Note:  Subjective:  May be discharged to SNF today.  He got HD earlier this AM.  Had 1.4 liters UF.  Labs were drawn during HD - question their accuracy.    Review of systems:    Denies n/v Denies shortness of breath or chest pain     Objective Vital signs in last 24 hours: Vitals:   09/02/22 0600 09/02/22 0630 09/02/22 0651 09/02/22 0746  BP: (!) 93/48 (!) 89/48 130/67 (!) 121/54  Pulse: 78 75 75 80  Resp: 18 18 18 18   Temp:   98 F (36.7 C)   TempSrc:   Axillary   SpO2: 100% 100%  100%  Weight:      Height:       Weight change:   Physical Exam:      General adult male in bed in no acute distress HEENT normocephalic atraumatic extraocular movements intact sclera anicteric Neck supple trachea midline Lungs clear to auscultation; normal work of breathing on room air  Heart regular rate  Abdomen soft nontender nondistended Extremities no edema residual limbs Psych normal mood and affect Access RIJ tunn catheter         Dialysis Orders: [facility]  4h   400/1.5   66kg  3K/2.5Ca bath   [facility] TDC   Heparin none - last OP HD 8/17, post wt 69.2kg - routinely comes off 1.5- 3kg over dry - hectorol 4 mcg IV three times per week - mircera 30 mcg IV q 4 wks, last 7/23, due 8/20 (yest, missed)   Assessment/Plan: Sepsis: + COVID and GNR bacteremia (Proteus + Enterobacter), Urine Cx = GNR./ TDC removed 8/22.  Abx per primary team and ID AMS: Likely due to #1,  mild uremia  skipping op hd / MS now baseline. ESRD: TTS schedule.  Noncompliant op hd per charting. TDC removed 8/22.  New tunn catheter placed on 8/26 with IR after line holiday  HD per TTS schedule    Labs today were during HD and question their accuracy - wouldn't replete K today HTN/volume: controlled on current regimen   Anemia of ESRD: has been on Aranesp 100 mcg every Sat.  May discharge today.  Hb 11.2.  Will hold dose for now unless trends lower. Resume outpatient ESA as needed  Secondary HPTH:  phos acceptable.  Restarted hectorol TTS with HD T2DM - per primary team  HFrEF (30-35%) - optimize volume status with HD PAD s/p B amputations Metabolic acidosis - off bicarb; back on HD after line holiday   Disposition - per primary team.  Team indicates he may be discharged today to SNF.  He has already had HD today so from a renal standpoint is acceptable for discharge   Labs: Basic Metabolic Panel: Recent Labs  Lab 08/28/22 0608 08/29/22 0704 08/30/22 0924 08/31/22 1013 09/01/22 0554 09/02/22 0600  NA 127* 131*   < > 131* 132* 132*  K 4.1 3.9   < > 3.7 3.5 3.1*  CL 98 100   < > 96* 94* 93*  CO2 19* 22   < > 27 27 28   GLUCOSE 121* 176*   < > 144* 109* 147*  BUN 65* 66*   < > 26* 15 10  CREATININE 5.83* 5.86*   < > 3.34* 2.95* 1.81*  CALCIUM 7.7* 8.1*   < > 7.8* 8.1* 8.0*  PHOS 3.5 4.0  --  2.4*  --   --    < > = values in this interval  not displayed.   Liver Function Tests: Recent Labs  Lab 08/27/22 0454 08/28/22 0608 08/29/22 0704  ALBUMIN 2.2* 2.4* 2.4*   No results for input(s): "LIPASE", "AMYLASE" in the last 168 hours. No results for input(s): "AMMONIA" in the last 168 hours. CBC: Recent Labs  Lab 08/29/22 0704 08/30/22 0924 08/31/22 1013 09/01/22 0554 09/02/22 0600  WBC 9.4 8.0 9.2 10.4 12.5*  HGB 10.4* 10.0* 10.2* 10.4* 11.2*  HCT 32.0* 30.8* 31.7* 32.2* 34.4*  MCV 89.9 93.1 91.6 88.7 88.4  PLT 131* 147* 156 173 213   Cardiac Enzymes: No results for input(s): "CKTOTAL", "CKMB", "CKMBINDEX", "TROPONINI" in the last 168 hours. CBG: Recent Labs  Lab 09/01/22 0728 09/01/22 1110 09/01/22 1632 09/01/22 2158 09/02/22 0733  GLUCAP 109* 181* 177* 253* 169*    Studies/Results: No results found. Medications:  anticoagulant sodium citrate      (feeding supplement) PROSource Plus  30 mL Oral BID BM   acetaminophen  812.5 mg Oral Q6H   carvedilol  6.25 mg Oral BID WC   Chlorhexidine Gluconate Cloth  6 each Topical Q0600   Chlorhexidine Gluconate  Cloth  6 each Topical Q0600   Chlorhexidine Gluconate Cloth  6 each Topical Q0600   darbepoetin (ARANESP) injection - DIALYSIS  100 mcg Subcutaneous Q Sat-1800   doxercalciferol  4 mcg Intravenous Q T,Th,Sa-HD   DULoxetine  60 mg Oral Daily   feeding supplement (NEPRO CARB STEADY)  237 mL Oral BID BM   heparin  5,000 Units Subcutaneous Q8H   insulin aspart  0-5 Units Subcutaneous QHS   insulin aspart  0-9 Units Subcutaneous TID WC   insulin glargine-yfgn  15 Units Subcutaneous QHS   nicotine  14 mg Transdermal Daily   potassium chloride  40 mEq Oral Once   pregabalin  50 mg Oral Daily   rosuvastatin  10 mg Oral Daily     Estanislado Emms, MD 9:20 AM 09/02/2022

## 2022-09-02 NOTE — Plan of Care (Signed)
  Problem: Fluid Volume: Goal: Ability to maintain a balanced intake and output will improve Outcome: Progressing   Problem: Health Behavior/Discharge Planning: Goal: Ability to identify and utilize available resources and services will improve Outcome: Progressing   Problem: Metabolic: Goal: Ability to maintain appropriate glucose levels will improve Outcome: Progressing   Problem: Nutritional: Goal: Maintenance of adequate nutrition will improve Outcome: Progressing   Problem: Skin Integrity: Goal: Risk for impaired skin integrity will decrease Outcome: Progressing   Problem: Respiratory: Goal: Will maintain a patent airway Outcome: Progressing   Problem: Clinical Measurements: Goal: Ability to maintain clinical measurements within normal limits will improve Outcome: Progressing   Problem: Nutrition: Goal: Adequate nutrition will be maintained Outcome: Progressing   Problem: Coping: Goal: Level of anxiety will decrease Outcome: Progressing   Problem: Pain Managment: Goal: General experience of comfort will improve Outcome: Progressing   Problem: Safety: Goal: Ability to remain free from injury will improve Outcome: Progressing   Problem: Skin Integrity: Goal: Risk for impaired skin integrity will decrease Outcome: Progressing   Problem: Respiratory: Goal: Will maintain a patent airway Outcome: Progressing

## 2022-09-03 ENCOUNTER — Telehealth: Payer: Self-pay

## 2022-09-03 ENCOUNTER — Telehealth: Payer: Self-pay | Admitting: *Deleted

## 2022-09-03 DIAGNOSIS — N186 End stage renal disease: Secondary | ICD-10-CM | POA: Diagnosis not present

## 2022-09-03 DIAGNOSIS — I739 Peripheral vascular disease, unspecified: Secondary | ICD-10-CM | POA: Diagnosis not present

## 2022-09-03 DIAGNOSIS — E1159 Type 2 diabetes mellitus with other circulatory complications: Secondary | ICD-10-CM | POA: Diagnosis not present

## 2022-09-03 DIAGNOSIS — U071 COVID-19: Secondary | ICD-10-CM | POA: Diagnosis not present

## 2022-09-03 DIAGNOSIS — I5042 Chronic combined systolic (congestive) and diastolic (congestive) heart failure: Secondary | ICD-10-CM | POA: Diagnosis not present

## 2022-09-03 NOTE — Progress Notes (Unsigned)
  Care Coordination Note  09/03/2022 Name: Wassim Sholler MRN: [id number] DOB: [birth year]  Dexter Wilbourne is a 62 y.o. year old male who is a primary care patient of Morrie Sheldon, MD and is actively engaged with the care management team. I reached out to Vivien Presto by phone today to assist with re-scheduling an initial visit with the RN Case Manager  Follow up plan: Unsuccessful telephone outreach attempt made. A HIPAA compliant phone message was left for the patient providing contact information and requesting a return call.   [facility]  Care Coordination Care Guide  Direct Dial: [phone]

## 2022-09-03 NOTE — Patient Outreach (Signed)
Error

## 2022-09-07 NOTE — Progress Notes (Signed)
  Care Coordination Note  09/07/2022 Name: [name] MRN: [id number] DOB: 1960-08-14  [name] is a 62 y.o. year old male who is a primary care patient of Morrie Sheldon, MD and is actively engaged with the care management team. I reached out to Vivien Presto by phone today to assist with re-scheduling an initial visit with the RN Case Manager  Follow up plan: Unsuccessful telephone outreach attempt made. A HIPAA compliant phone message was left for the patient providing contact information and requesting a return call.  We have been unable to make contact with the patient for follow up. The care management team is available to follow up with the patient after provider conversation with the patient regarding recommendation for care management engagement and subsequent re-referral to the care management team.   [facility] Coordination Care Guide  Direct Dial: [phone]

## 2022-09-17 ENCOUNTER — Encounter: Payer: 59 | Admitting: Student

## 2022-09-21 ENCOUNTER — Telehealth: Payer: Self-pay | Admitting: *Deleted

## 2022-09-21 ENCOUNTER — Ambulatory Visit: Payer: 59

## 2022-09-21 NOTE — Telephone Encounter (Signed)
Call from [facility] PT [facility] needs verbal orders for [facility] PT.  2 times a week for 3 weeks.  1 time a week of 3 weeks.  Needs for Bilateral  lower extremity Strengthening, Prosthetic Education, Location manager, Research officer, political party, Home Exercise program and Safety Fall Precautions.  Verbal ok given.  Will forward to PCP for approval or denial of order.

## 2022-09-22 ENCOUNTER — Other Ambulatory Visit: Payer: Self-pay | Admitting: *Deleted

## 2022-09-22 NOTE — Patient Outreach (Signed)
Per [facility] Mr. Paddock discharged from [facility] skilled nursing facility on 09/17/22. Screening for potential care coordination/care management services as benefit of health plan and  Primary Care Provider.  Previously confirmed with [facility] social worker, [facility] will follow.  Telephone call made to Mr. Crus to discuss care coordination/care management services. Mr. Pach was previously referred for chronic care management services. However, care coordination team was unable to maintain contact.   Telephone call made to Mr. Leddon at [phone]. No answer. HIPAA compliant voicemail message left to request return call.   [name], MSN, RN, BSN [place]  [facility] RN Post- Acute Care Coordinator Direct Dial: [phone]

## 2022-09-23 ENCOUNTER — Telehealth: Payer: Self-pay | Admitting: Student

## 2022-09-23 NOTE — Telephone Encounter (Signed)
RTC to [place] OT with [facility] requested OT 1 time a week for 5 weeks.  Increase independence and safety with ADL's. Increase use of Bilateral hands through strengthening and co-ordination.  Verabal order given.  Will forward to doctor for approval or denial.

## 2022-09-23 NOTE — Telephone Encounter (Signed)
Rec'd a call from [facility] Requesting a VO.  Please call back and speak with [facility] [phone]

## 2022-10-04 ENCOUNTER — Other Ambulatory Visit: Payer: Self-pay | Admitting: *Deleted

## 2022-10-04 DIAGNOSIS — N186 End stage renal disease: Secondary | ICD-10-CM
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
## 2022-10-14 ENCOUNTER — Ambulatory Visit (HOSPITAL_COMMUNITY): Payer: 59 | Attending: Vascular Surgery

## 2022-10-14 ENCOUNTER — Ambulatory Visit (HOSPITAL_COMMUNITY): Payer: 59

## 2022-10-14 ENCOUNTER — Ambulatory Visit: Payer: 59 | Admitting: Vascular Surgery

## 2022-10-20 ENCOUNTER — Other Ambulatory Visit (HOSPITAL_COMMUNITY): Payer: Self-pay

## 2022-11-01 ENCOUNTER — Other Ambulatory Visit (HOSPITAL_COMMUNITY): Payer: Self-pay

## 2022-12-06 ENCOUNTER — Telehealth: Payer: Self-pay | Admitting: *Deleted

## 2022-12-06 NOTE — Telephone Encounter (Signed)
Received forms for PCS for [name], patient contacted last seen Feb.2024, patient insturcted to call our office tomorrow to get appointment to be seen. Also called Sherolyn Buba -[phone]-[facility]. , to inform her that this patient has not been seen by our office since Feb. 2024 . Paper work can not be completed until seen by our office.

## 2022-12-07 ENCOUNTER — Telehealth: Payer: Self-pay | Admitting: *Deleted

## 2022-12-07 NOTE — Telephone Encounter (Signed)
PCS form (folder # 3 ) in folder placed in yellow team box to be completed when patient comes on for office visit/ been more than 90 days since last seen.
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
## 2022-12-10 ENCOUNTER — Encounter (HOSPITAL_COMMUNITY)
Admission: RE | Disposition: A | Discharge status: Home / Self Care | Payer: Self-pay | Source: Home / Self Care | Attending: Nephrology

## 2022-12-10 ENCOUNTER — Ambulatory Visit (HOSPITAL_COMMUNITY)
Admission: RE | Admit: 2022-12-10 | Discharge: 2022-12-10 | Disposition: A | Discharge status: Home / Self Care | Payer: 59 | Attending: Nephrology | Admitting: Nephrology

## 2022-12-10 ENCOUNTER — Encounter (HOSPITAL_COMMUNITY): Payer: Self-pay | Admitting: Nephrology

## 2022-12-10 ENCOUNTER — Other Ambulatory Visit: Payer: Self-pay

## 2022-12-10 DIAGNOSIS — Z833 Family history of diabetes mellitus: Secondary | ICD-10-CM | POA: Insufficient documentation

## 2022-12-10 DIAGNOSIS — F17200 Nicotine dependence, unspecified, uncomplicated: Secondary | ICD-10-CM | POA: Insufficient documentation

## 2022-12-10 DIAGNOSIS — Z89612 Acquired absence of left leg above knee: Secondary | ICD-10-CM | POA: Diagnosis not present

## 2022-12-10 DIAGNOSIS — E1122 Type 2 diabetes mellitus with diabetic chronic kidney disease: Secondary | ICD-10-CM | POA: Diagnosis not present

## 2022-12-10 DIAGNOSIS — Z992 Dependence on renal dialysis: Secondary | ICD-10-CM | POA: Insufficient documentation

## 2022-12-10 DIAGNOSIS — Z8249 Family history of ischemic heart disease and other diseases of the circulatory system: Secondary | ICD-10-CM | POA: Diagnosis not present

## 2022-12-10 DIAGNOSIS — I132 Hypertensive heart and chronic kidney disease with heart failure and with stage 5 chronic kidney disease, or end stage renal disease: Secondary | ICD-10-CM | POA: Insufficient documentation

## 2022-12-10 DIAGNOSIS — E1151 Type 2 diabetes mellitus with diabetic peripheral angiopathy without gangrene: Secondary | ICD-10-CM | POA: Insufficient documentation

## 2022-12-10 DIAGNOSIS — Z89511 Acquired absence of right leg below knee: Secondary | ICD-10-CM | POA: Insufficient documentation

## 2022-12-10 DIAGNOSIS — T85611A Breakdown (mechanical) of intraperitoneal dialysis catheter, initial encounter: Secondary | ICD-10-CM | POA: Diagnosis present

## 2022-12-10 DIAGNOSIS — N186 End stage renal disease: Secondary | ICD-10-CM | POA: Insufficient documentation

## 2022-12-10 DIAGNOSIS — I5022 Chronic systolic (congestive) heart failure: Secondary | ICD-10-CM | POA: Insufficient documentation

## 2022-12-10 HISTORY — PX: DIALYSIS/PERMA CATHETER INSERTION: CATH118288

## 2022-12-10 HISTORY — PX: DIALYSIS/PERMA CATHETER REMOVAL: CATH118289

## 2022-12-10 LAB — POCT I-STAT, CHEM 8
BUN: 38 mg/dL — ABNORMAL HIGH (ref 8–23)
Calcium, Ion: 1.22 mmol/L (ref 1.15–1.40)
Chloride: 103 mmol/L (ref 98–111)
Creatinine, Ser: 4.9 mg/dL — ABNORMAL HIGH (ref 0.61–1.24)
Glucose, Bld: 236 mg/dL — ABNORMAL HIGH (ref 70–99)
HCT: 33 % — ABNORMAL LOW (ref 39.0–52.0)
Hemoglobin: 11.2 g/dL — ABNORMAL LOW (ref 13.0–17.0)
Potassium: 4.4 mmol/L (ref 3.5–5.1)
Sodium: 137 mmol/L (ref 135–145)
TCO2: 22 mmol/L (ref 22–32)

## 2022-12-10 LAB — GLUCOSE, CAPILLARY: Glucose-Capillary: 206 mg/dL — ABNORMAL HIGH (ref 70–99)

## 2022-12-10 SURGERY — DIALYSIS/PERMA CATHETER INSERTION
Anesthesia: LOCAL

## 2022-12-10 MED ORDER — FENTANYL CITRATE (PF) 100 MCG/2ML IJ SOLN
INTRAMUSCULAR | Status: AC
  Filled 2022-12-10: qty 2

## 2022-12-10 MED ORDER — FENTANYL CITRATE (PF) 100 MCG/2ML IJ SOLN
INTRAMUSCULAR | Status: DC | PRN
  Administered 2022-12-10: 25 ug via INTRAVENOUS

## 2022-12-10 MED ORDER — HEPARIN SODIUM (PORCINE) 1000 UNIT/ML IJ SOLN
INTRAMUSCULAR | Status: AC
  Filled 2022-12-10: qty 10

## 2022-12-10 MED ORDER — MIDAZOLAM HCL 2 MG/2ML IJ SOLN
INTRAMUSCULAR | Status: AC
  Filled 2022-12-10: qty 2

## 2022-12-10 MED ORDER — ONDANSETRON HCL 4 MG/2ML IJ SOLN: 4.0000 mg | Freq: Four times a day (QID) | INTRAMUSCULAR | Status: DC | PRN

## 2022-12-10 MED ORDER — LIDOCAINE HCL (PF) 1 % IJ SOLN
INTRAMUSCULAR | Status: DC | PRN
  Administered 2022-12-10: 5 mL via INTRADERMAL

## 2022-12-10 MED ORDER — IODIXANOL 320 MG/ML IV SOLN
INTRAVENOUS | Status: DC | PRN
  Administered 2022-12-10: 3 mL via INTRAVENOUS

## 2022-12-10 MED ORDER — LIDOCAINE HCL (PF) 1 % IJ SOLN
INTRAMUSCULAR | Status: AC
  Filled 2022-12-10: qty 30

## 2022-12-10 MED ORDER — ACETAMINOPHEN 325 MG PO TABS: 650.0000 mg | ORAL_TABLET | ORAL | Status: DC | PRN

## 2022-12-10 MED ORDER — SODIUM CHLORIDE 0.9% FLUSH: 10.0000 mL | Freq: Two times a day (BID) | INTRAVENOUS | Status: DC

## 2022-12-10 MED ORDER — HYDRALAZINE HCL 20 MG/ML IJ SOLN: 10.0000 mg | Freq: Once | INTRAMUSCULAR | Status: DC

## 2022-12-10 MED ORDER — MIDAZOLAM HCL 2 MG/2ML IJ SOLN
INTRAMUSCULAR | Status: DC | PRN
  Administered 2022-12-10: 1 mg via INTRAVENOUS

## 2022-12-10 MED ORDER — SODIUM CHLORIDE 0.9% FLUSH: 3.0000 mL | INTRAVENOUS | Status: DC | PRN

## 2022-12-10 SURGICAL SUPPLY — 4 items
CATH PALINDROME-P 19CM W/VT (CATHETERS) IMPLANT
COVER DOME SNAP 22 D (MISCELLANEOUS) IMPLANT
GUIDEWIRE ANGLED .035X150CM (WIRE) IMPLANT
TRAY PV CATH (CUSTOM PROCEDURE TRAY) IMPLANT

## 2022-12-10 NOTE — Op Note (Signed)
Patient presents with poor flows in his right IJ tunneled hemodialysis catheter (19 cm cuff to tip- Palindrome) that was changed 11/12/22. On examination, aspiration from venous port was sluggish.  Summary:  1) The patient had a successful 19 cm CTT Palindrome hemodialysis catheter exchange in the right internal jugular vein. 2) No sheath noted through either port. 3) Okay to use catheter immediately.  Description of procedure: The right neck, chest and the catheter were prepped and draped in the usual sterile fashion. The exit site and adjacent tunnel tract were anesthetized with lidocaine 1% with epinephrine. The cuff was dissected free with a hemostat and manual traction. The catheter was withdrawn and venogram through each of the ports was performed; there was no fibrin sheath evident through either port.   A hydrophilic wire was manipulated and advanced through the arterial port and the tip was parked in the IVC. The catheter was completely removed and noted to be entirely intact. A new 19 cm cuff to tip Palindrome catheter was inserted over the guidewire and the tip parked in the right atrium and at that point the cuff was approximately 1 cm deep to the exit site.   Aspiration and flushing of both limbs of the catheter confirmed excellent flow. No kinks were visible on fluoroscopic imaging. Both limbs of the catheter were locked with citrate and sterile caps were placed.   The hub was secured on to the chest wall with 2-0 nylon wing sutures.  Sterile dressings were placed, and the patient returned to recovery in stable condition.  Sedation: Please see the pre-op and intra-op nursing notes for total doses.  Monitoring: Because of the patient's comorbid conditions and sedation during the procedure, continuous EKG monitoring and O2 saturation monitoring was performed throughout the procedure by the RN. There were no abnormal arrhythmias encountered.  Complications: None.  Diagnoses:    T82.49XA Other complication of vascular dialysis catheter (Poor flows) N18.6 End stage renal disease  Z99.2 Dialysis dependence  Procedures Coding:  36581 Tunneled catheter exchange 77001  Fluoroscopy guidance for catheter exchange. A4166 Contrast  Recommendations: Remove the suture in 3 weeks. 2.   Report any blood flow problems to [facility].  Discharge: The patient was discharged home in stable condition. The patient was given education regarding the care of the catheter and specific instructions in case of any problems.

## 2022-12-10 NOTE — Discharge Instructions (Signed)

## 2022-12-13 MED FILL — Heparin Sodium (Porcine) Inj 1000 Unit/ML: INTRAMUSCULAR | Qty: 10 | Status: AC

## 2022-12-15 NOTE — Progress Notes (Unsigned)
POST OPERATIVE OFFICE NOTE    CC:  F/u for surgery  HPI:  [name] is a 62 y.o. male who is here for a triage visit. 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 He recently required L AKA by Dr.Duda on 08/12/2021 for ischemic L foot wounds with no options for revascularization.   During his hospitalization on 08/12/2021, the patient started developing gangrenous changes to bilateral fingertips, L>R. He has a history of left ulnar basilic AV fistula creation by Dr. Lenell Antu on 07/02/2021.  Work-up in the hospital did not reveal a central source to the patient's bilateral fingertip ulcerations.  He underwent bilateral upper extremity arteriograms and left AV fistula ligation on 08/18/2021.  Bilateral arteriograms showed small vessels through the wrist and hands, but no correctable hemodynamically significant stenosis.  It was hopeful that ligation of his left AV fistula would prevent his ulcerations from getting worse.  He was to follow-up with [place] postop in 3 weeks.  Pt returns today for follow up.  Pt states the gangrenous changes to his fingertips bilaterally have gone away.  He still has some scattered ulcerations on bilateral knuckles that have scabbed over.  He has been experiencing persistent hand numbness, coldness, tingling going up his forearms, and pains in his fingertips since his procedures 2 months ago.  He will occasionally do warm water soaks for his hands to warm them up.  He states that he is also constantly wearing gloves to keep his hands warm.  He is currently dialyzing on TTS via right IJ TDC, placed by IR. Allergies  Allergen Reactions   Benicar [Olmesartan] Cough    Current Outpatient Medications  Medication Sig Dispense Refill   Accu-Chek FastClix Lancets MISC Check blood sugar 3 (three) times daily. 102 each 12   acetaminophen (TYLENOL) 325 MG tablet Take 2 tablets (650 mg total) by mouth every 4 (four) hours as needed for mild pain or moderate  pain.     acetaminophen (TYLENOL) 650 MG CR tablet Take 2 tablets (1,300 mg total) by mouth every 6 (six) hours as needed for pain 240 tablet 0   amLODipine (NORVASC) 10 MG tablet Take 1 tablet (10 mg total) by mouth daily. (Patient not taking: Reported on 08/10/2021) 90 tablet 2   amLODipine (NORVASC) 10 MG tablet Take 1 tablet (10 mg total) by mouth daily for hypertension 30 tablet 0   aspirin EC 81 MG tablet Take 1 tablet (81 mg total) by mouth daily. Swallow whole. (Patient not taking: Reported on 07/02/2021) 30 tablet 0   aspirin EC 81 MG tablet Take 1 tablet (81 mg total) by mouth daily. 30 tablet 0   blood glucose meter kit and supplies KIT Dispense based on patient and insurance preference. Use up to four times daily as directed. (FOR ICD-9 250.00, 250.01). 1 each 0   Blood Glucose Monitoring Suppl (ACCU-CHEK AVIVA PLUS) w/Device KIT Check blood sugar 3 times a day 1 kit 0   Blood Glucose Monitoring Suppl (BLOOD GLUCOSE MONITOR SYSTEM) w/Device KIT Use to check blood sugar 3 (three) times daily. 1 kit 0   carvedilol (COREG) 6.25 MG tablet Take 1 tablet (6.25 mg total) by mouth 2 (two) times daily with a meal. (Patient not taking: Reported on 08/10/2021) 180 tablet 1   carvedilol (COREG) 6.25 MG tablet Take 1 tablet (6.25 mg total) by mouth 2 (two) times daily with meals for hypertension 60 tablet 0   cetirizine (  ZYRTEC ALLERGY) 10 MG tablet Take 1 tablet (10 mg total) by mouth daily. (Patient not taking: Reported on 07/02/2021) 30 tablet 2   cetirizine (ZYRTEC) 10 MG tablet Take 1 tablet (10 mg total) by mouth daily for allergic rhinitis 30 tablet 0   Continuous Blood Gluc Sensor (FREESTYLE LIBRE 2 SENSOR) MISC Use as directed every 14 days 2 each 0   Continuous Blood Gluc Sensor (FREESTYLE LIBRE 2 SENSOR) MISC Place 1 sensor on the skin every 14 days. Use to check glucose continuously 2 each 3   Darbepoetin Alfa (ARANESP) 100 MCG/0.5ML SOSY injection Inject 0.5 mLs (100 mcg total) into the vein  every Wednesday with hemodialysis. 4.2 mL    DULoxetine (CYMBALTA) 30 MG capsule Take 2 capsules (60 mg total) by mouth daily. (Patient not taking: Reported on 08/10/2021) 180 capsule 1   DULoxetine (CYMBALTA) 60 MG capsule Take 1 capsule (60 mg total) by mouth daily for depression (Do NOT CRUSH) 30 capsule 0   ezetimibe (ZETIA) 10 MG tablet Take 1 tablet (10 mg total) by mouth daily. (Patient not taking: Reported on 08/10/2021) 30 tablet 0   ezetimibe (ZETIA) 10 MG tablet Take 1 tablet (10 mg total) by mouth daily. 30 tablet 0   fluticasone (FLONASE) 50 MCG/ACT nasal spray Place 1 spray into both nostrils daily. (Patient not taking: Reported on 08/10/2021) 16 g 0   fluticasone (FLONASE) 50 MCG/ACT nasal spray Place 1 spray into both nostrils daily for allergic rhinitis 16 g 0   glucose blood (ACCU-CHEK AVIVA PLUS) test strip Check blood sugar 3 times a day 100 each 12   insulin aspart (NOVOLOG) 100 UNIT/ML FlexPen Inject 11 Units into the skin 3 (three) times daily with meals. (Patient not taking: Reported on 08/10/2021) 15 mL 4   insulin aspart (NOVOLOG) 100 UNIT/ML FlexPen Inject 5 Units into the skin 3 (three) times daily with meals. 15 mL 4   insulin glargine (LANTUS) 100 UNIT/ML Solostar Pen Inject 16 Units into the skin at bedtime. (PRIME PEN WITH 2 UNITS PRIOR TO EACH USE) 3 mL 0   insulin glargine-yfgn (SEMGLEE) 100 UNIT/ML Pen Inject 10 Units into the skin at bedtime. 3 mL 3   Insulin Pen Needle 32G X 4 MM MISC Use to inject insulin 4 (four) times daily. 360 each 3   isosorbide mononitrate (IMDUR) 30 MG 24 hr tablet Take 0.5 tablets (15 mg total) by mouth daily for ischemic cardiomyopathy 15 tablet 0   melatonin (KP MELATONIN) 3 MG TABS tablet Take 1 tablet (3 mg total) by mouth at bedtime for insomnia. 30 tablet 0   oxyCODONE (OXY IR/ROXICODONE) 5 MG immediate release tablet Take 1 tablet (5 mg total) by mouth every 6 (six) hours as needed for pain 120 tablet 0   pantoprazole (PROTONIX) 40 MG  tablet Take 1 tablet by mouth once a day 30 tablet 11   pantoprazole (PROTONIX) 40 MG tablet Take 1 tablet (40 mg total) by mouth daily for GERD. 30 tablet 0   polyethylene glycol powder (GLYCOLAX/MIRALAX) 17 GM/SCOOP powder Take 17 g by mouth daily. 510 g 0   pregabalin (LYRICA) 25 MG capsule Take 1 capsule (25 mg total) by mouth daily. (Patient not taking: Reported on 08/10/2021) 90 capsule 1   pregabalin (LYRICA) 25 MG capsule Take 1 capsule (25 mg total) by mouth daily. 30 capsule 0   rosuvastatin (CRESTOR) 10 MG tablet Take 1 tablet (10 mg total) by mouth daily. (Patient not taking: Reported on 08/10/2021) 30  tablet 0   rosuvastatin (CRESTOR) 10 MG tablet Take 1 tablet (10 mg total) by mouth daily for HLD 30 tablet 0   senna-docusate (SENEXON-S) 8.6-50 MG tablet Take 1 tablet by mouth at bedtime for constipation 30 tablet 0   senna-docusate (SENOKOT-S) 8.6-50 MG tablet Take 1 tablet by mouth at bedtime. (Patient not taking: Reported on 08/10/2021) 12 tablet 0   No current facility-administered medications for this visit.     ROS:  See HPI  Physical Exam:  Incision: Left upper arm incisions well-healed. Extremities: No gangrenous changes to fingertips/hands bilaterally. 2-3 scattered ulcerations on his knuckles bilaterally that have scabbed over.  Hands are cool to the touch.  Brisk radial doppler signals bilaterally.  Soft ulnar and deep palmar doppler signals bilaterally. Neuro: Intact motor and sensation.  Grip strength 5 out of 5 bilaterally   Assessment/Plan:  This is a 62 y.o. male who is s/p: Bilateral upper extremity arteriogram and ligation of left AV fistula on 08/18/2021.  -His left arm incisions are well-healed.  He is still dialyzing -Bilateral upper extremity arteriograms during his hospitalization showed small vessels in his forearms and hands bilaterally.  He had no areas of hemodynamically significant stenosis -He has brisk radial Doppler signals bilaterally.  He has soft  ulnar and deep palmar doppler signals bilaterally -He is still experiencing some hand coldness and pains, most likely mixed due to small vessel disease and diabetic neuropathy.  I have explained to him that there is currently nothing for [place] to do from a vascular standpoint.  He should keep his hands protected, and he can continue to keep his hands warm with gloves and/or warm water soaks.  -He currently takes pregabalin.  I have educated him that if he is interested, his PCP could potentially get this medication increased or switched to something else to help with his neuropathy -He can follow up with [place] as needed   Loel Dubonnet, PA-C [facility] [phone]

## 2022-12-16 ENCOUNTER — Ambulatory Visit: Payer: Medicaid Other | Admitting: Vascular Surgery

## 2022-12-16 ENCOUNTER — Ambulatory Visit (HOSPITAL_COMMUNITY): Payer: 59

## 2022-12-17 ENCOUNTER — Other Ambulatory Visit (HOSPITAL_COMMUNITY): Payer: Self-pay

## 2022-12-20 ENCOUNTER — Telehealth: Payer: Self-pay

## 2022-12-20 ENCOUNTER — Telehealth: Payer: Self-pay | Admitting: *Deleted

## 2022-12-20 NOTE — Telephone Encounter (Signed)
Call from Scharlene Gloss, NP [facility].  Visited patient has not been taking his medication.  Last fills were back in August from his local Pharmacy. All his vials of Insulin were empty.  Bottles for meds are also empty.    Prescriptions were written when he was a [employer].  No refills are on his meds.  Point of Care HbA1C was done at her visit  8. Just wanted doctor to be aware of what was going on.  Has an appointment this week.  Not sure if patient will keep appointment.

## 2022-12-20 NOTE — Telephone Encounter (Signed)
Return pt's call. He stated when he moved several things we missing including his medications. Pt has No Show several appts. LOV was 03/03/22 with Dr Argentina Ponder. Stated he needs refills on all of his meds. I informed pt he has an appt on Wed 12/18 and to keep this appt. He asked if he has to wait until Wed; I asked how long has he been out of meds, he stated about a month. I told him the doctor may have to re-adjust his meds but I will inform his doctor.

## 2022-12-20 NOTE — Telephone Encounter (Signed)
Pt is requesting a call back ... He stated that he moved and all of his meds are missing .[name] KitchenMarland [name]

## 2022-12-22 ENCOUNTER — Encounter: Payer: Self-pay | Admitting: Student

## 2022-12-22 ENCOUNTER — Other Ambulatory Visit (HOSPITAL_COMMUNITY): Payer: Self-pay

## 2022-12-22 ENCOUNTER — Ambulatory Visit (INDEPENDENT_AMBULATORY_CARE_PROVIDER_SITE_OTHER): Payer: 59 | Admitting: Student

## 2022-12-22 VITALS — BP 156/54 | HR 83 | Temp 97.9°F | Ht 69.0 in | Wt 159.0 lb

## 2022-12-22 DIAGNOSIS — I132 Hypertensive heart and chronic kidney disease with heart failure and with stage 5 chronic kidney disease, or end stage renal disease: Secondary | ICD-10-CM | POA: Diagnosis not present

## 2022-12-22 DIAGNOSIS — E1151 Type 2 diabetes mellitus with diabetic peripheral angiopathy without gangrene: Secondary | ICD-10-CM

## 2022-12-22 DIAGNOSIS — I5040 Unspecified combined systolic (congestive) and diastolic (congestive) heart failure: Secondary | ICD-10-CM | POA: Diagnosis not present

## 2022-12-22 DIAGNOSIS — Z89612 Acquired absence of left leg above knee: Secondary | ICD-10-CM

## 2022-12-22 DIAGNOSIS — I739 Peripheral vascular disease, unspecified: Secondary | ICD-10-CM

## 2022-12-22 DIAGNOSIS — S78112A Complete traumatic amputation at level between left hip and knee, initial encounter: Secondary | ICD-10-CM | POA: Insufficient documentation

## 2022-12-22 DIAGNOSIS — Z794 Long term (current) use of insulin: Secondary | ICD-10-CM | POA: Diagnosis not present

## 2022-12-22 DIAGNOSIS — G629 Polyneuropathy, unspecified: Secondary | ICD-10-CM | POA: Insufficient documentation

## 2022-12-22 DIAGNOSIS — N186 End stage renal disease: Secondary | ICD-10-CM

## 2022-12-22 DIAGNOSIS — Z89511 Acquired absence of right leg below knee: Secondary | ICD-10-CM

## 2022-12-22 DIAGNOSIS — I214 Non-ST elevation (NSTEMI) myocardial infarction: Secondary | ICD-10-CM

## 2022-12-22 DIAGNOSIS — Z992 Dependence on renal dialysis: Secondary | ICD-10-CM

## 2022-12-22 DIAGNOSIS — I10 Essential (primary) hypertension: Secondary | ICD-10-CM

## 2022-12-22 DIAGNOSIS — E1122 Type 2 diabetes mellitus with diabetic chronic kidney disease: Secondary | ICD-10-CM

## 2022-12-22 DIAGNOSIS — R5381 Other malaise: Secondary | ICD-10-CM | POA: Insufficient documentation

## 2022-12-22 DIAGNOSIS — E1140 Type 2 diabetes mellitus with diabetic neuropathy, unspecified: Secondary | ICD-10-CM

## 2022-12-22 DIAGNOSIS — Z760 Encounter for issue of repeat prescription: Secondary | ICD-10-CM | POA: Insufficient documentation

## 2022-12-22 LAB — POCT GLYCOSYLATED HEMOGLOBIN (HGB A1C): Hemoglobin A1C: 9.3 % — AB (ref 4.0–5.6)

## 2022-12-22 LAB — GLUCOSE, CAPILLARY: Glucose-Capillary: 304 mg/dL — ABNORMAL HIGH (ref 70–99)

## 2022-12-22 MED ORDER — ACCU-CHEK AVIVA PLUS VI STRP
ORAL_STRIP | 12 refills | Status: DC
  Filled 2022-12-22: qty 100, 30d supply, fill #0

## 2022-12-22 MED ORDER — PREGABALIN 25 MG PO CAPS
25.0000 mg | ORAL_CAPSULE | Freq: Every day | ORAL | 0 refills | Status: DC
  Filled 2022-12-22: qty 30, 30d supply, fill #0

## 2022-12-22 MED ORDER — ACCU-CHEK FASTCLIX LANCETS MISC
Freq: Three times a day (TID) | 12 refills | Status: DC
  Filled 2022-12-22: qty 102, fill #0

## 2022-12-22 MED ORDER — ROSUVASTATIN CALCIUM 10 MG PO TABS
10.0000 mg | ORAL_TABLET | Freq: Every day | ORAL | 2 refills | Status: DC
  Filled 2022-12-22: qty 90, 90d supply, fill #0
  Filled 2023-05-25: qty 90, 90d supply, fill #1
  Filled 2023-07-21 – 2023-08-19 (×2): qty 90, 90d supply, fill #2

## 2022-12-22 MED ORDER — INSULIN GLARGINE 100 UNIT/ML SOLOSTAR PEN
15.0000 [IU] | PEN_INJECTOR | Freq: Every day | SUBCUTANEOUS | 1 refills | Status: DC
  Filled 2022-12-22: qty 15, 90d supply, fill #0

## 2022-12-22 MED ORDER — CARVEDILOL 6.25 MG PO TABS
6.2500 mg | ORAL_TABLET | Freq: Two times a day (BID) | ORAL | 0 refills | Status: DC
  Filled 2022-12-22: qty 60, 30d supply, fill #0

## 2022-12-22 MED ORDER — ASPIRIN 81 MG PO TBEC
81.0000 mg | DELAYED_RELEASE_TABLET | Freq: Every day | ORAL | Status: DC
Start: 2022-12-22 — End: 2022-12-22

## 2022-12-22 MED ORDER — ASPIRIN 81 MG PO TBEC: 81.0000 mg | DELAYED_RELEASE_TABLET | Freq: Every day | ORAL | Status: DC

## 2022-12-22 MED ORDER — EZETIMIBE 10 MG PO TABS
10.0000 mg | ORAL_TABLET | Freq: Every day | ORAL | 3 refills | Status: DC
  Filled 2022-12-22: qty 90, 90d supply, fill #0

## 2022-12-22 MED ORDER — ACCU-CHEK SOFTCLIX LANCETS MISC
Freq: Three times a day (TID) | 12 refills | Status: AC
  Filled 2022-12-22: qty 100, 33d supply, fill #0
  Filled 2023-05-25: qty 100, 33d supply, fill #1
  Filled 2023-07-12: qty 100, 33d supply, fill #2
  Filled 2023-07-21 – 2023-08-19 (×2): qty 100, 33d supply, fill #3
  Filled 2023-09-15: qty 100, 33d supply, fill #4
  Filled 2023-12-15: qty 100, 33d supply, fill #5

## 2022-12-22 MED ORDER — ROSUVASTATIN CALCIUM 10 MG PO TABS
10.0000 mg | ORAL_TABLET | Freq: Every day | ORAL | 2 refills | Status: DC
  Filled 2022-12-22: qty 90, 90d supply, fill #0

## 2022-12-22 MED ORDER — INSULIN PEN NEEDLE 32G X 4 MM MISC
Freq: Four times a day (QID) | 3 refills | Status: DC
Start: 2022-12-22 — End: 2022-12-22
  Filled 2022-12-22: qty 400, 90d supply, fill #0

## 2022-12-22 MED ORDER — ACCU-CHEK AVIVA PLUS VI STRP
ORAL_STRIP | 12 refills | Status: AC
  Filled 2022-12-22: qty 100, 33d supply, fill #0
  Filled 2023-05-25: qty 100, 33d supply, fill #1
  Filled 2023-07-12: qty 100, 33d supply, fill #2
  Filled 2023-07-21 – 2023-08-19 (×2): qty 100, 33d supply, fill #3
  Filled 2023-09-15: qty 100, 33d supply, fill #4
  Filled 2023-12-15: qty 100, 33d supply, fill #5

## 2022-12-22 MED ORDER — INSULIN GLARGINE 100 UNIT/ML SOLOSTAR PEN
15.0000 [IU] | PEN_INJECTOR | Freq: Every day | SUBCUTANEOUS | 1 refills | Status: DC
  Filled 2022-12-22 – 2023-03-30 (×3): qty 15, 100d supply, fill #0

## 2022-12-22 MED ORDER — EZETIMIBE 10 MG PO TABS
10.0000 mg | ORAL_TABLET | Freq: Every day | ORAL | 3 refills | Status: DC
  Filled 2022-12-22: qty 90, 90d supply, fill #0
  Filled 2023-05-25: qty 90, 90d supply, fill #1
  Filled 2023-07-21 – 2023-08-19 (×2): qty 90, 90d supply, fill #2
  Filled 2023-09-15: qty 90, 90d supply, fill #3
  Filled 2023-10-27: qty 30, 30d supply, fill #3

## 2022-12-22 MED ORDER — PREGABALIN 25 MG PO CAPS
25.0000 mg | ORAL_CAPSULE | Freq: Every day | ORAL | 0 refills | Status: DC
Start: 2022-12-22 — End: 2022-12-22
  Filled 2022-12-22: qty 30, 30d supply, fill #0

## 2022-12-22 MED ORDER — INSULIN PEN NEEDLE 32G X 4 MM MISC
Freq: Four times a day (QID) | 3 refills | Status: AC
  Filled 2022-12-22: qty 300, 75d supply, fill #0
  Filled 2023-05-25: qty 300, 75d supply, fill #1
  Filled 2023-07-21 – 2023-08-19 (×2): qty 300, 75d supply, fill #2
  Filled 2023-09-15 – 2023-12-15 (×2): qty 300, 75d supply, fill #3

## 2022-12-22 NOTE — Progress Notes (Addendum)
Internal Medicine Clinic Attending  I was physically present during the key portions of the resident provided service and participated in the medical decision making of patient's management care. I reviewed pertinent patient test results.  The assessment, diagnosis, and plan were formulated together and I agree with the documentation in the resident's note.  [name] Aschoff, MD   Subjective:  CC: PVD, type 2 diabetes, ESRD, neuropathic pain, medication refill, and care coordination.  HPI:  [name] is a 62 y.o. person with a past medical history stated below and presents today for the stated chief complaint. Please see problem based assessment and plan for additional details.  Past Medical History:  Diagnosis Date   Atherosclerosis of native arteries of extremities with gangrene, left leg (HCC)    ESRD on hemodialysis (HCC)    Gangrene (HCC)    right foot   GERD (gastroesophageal reflux disease)    HFrEF (heart failure with reduced ejection fraction) (HCC)    Hyperlipidemia    Hypertension    Infection of amputation stump, left lower extremity (HCC) 08/10/2021   Neuromuscular disorder (HCC)    diabetic neruopathy - hands   Osteomyelitis (HCC) 2010   left foot, s/p midfoot amputation   Osteomyelitis (HCC) 09/2013   RT BKA   Osteomyelitis of ankle or foot 05/2011   rt foot, s/p 5th ray amputation   PAD (peripheral artery disease) (HCC)    Pneumonia 2010   Retroperitoneal hematoma    05/08/2021 renal biopsy complicated by retroperitoneal hematoma. 5/9 - 05/20/2021 H/H 6.9/20.9.  Imaging revealed large retroperitoneal hematoma.  S/P 2 units packed cells H/H stabilized with final values of 11.3/36.4.   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 350 g 3   DULoxetine (CYMBALTA) 60 MG capsule Take 1 capsule (60 mg total) by mouth daily. 90 capsule 3   insulin lispro (HUMALOG) 100 UNIT/ML KwikPen Inject 5 Units into the skin 3 (three) times daily with meals. 15 mL 3   melatonin (KP MELATONIN) 3 MG TABS tablet Take 1 tablet (3 mg total) by mouth at bedtime for insomnia. 60 tablet 0   No current facility-administered medications on file prior to visit.    Review of Systems: Please see assessment and plan for pertinent positives and negatives.  Objective:   Vitals:   12/22/22 1511 12/22/22 1523 12/22/22 1532  BP: (!) 184/83 (!) 175/68 (!) 156/54  Pulse: 86  83  Temp: 97.9 F (36.6 C)    TempSrc: Oral    SpO2: 100%    Weight: 159 lb (72.1 kg)    Height: 5\' 9"  (1.753 m)      Physical Exam: Constitutional: Chronically ill-appearing Cardiovascular: Regular rate and rhythm - left carotid bruit appreciated.  Pulmonary/Chest: lungs clear to auscultation bilaterally Abdominal: soft, non-tender, non-distended Extremities: Status post bilateral lower extremity amputation.  No edema of the lower extremities bilaterally.  Pain/claudication of the hands with elevation.  Left radial pulse absent, right radial pulse weak. Neuro: Decreased positional sense, sensation, vibratory sense of the fingers and wrists bilaterally.  Psych: Pleasant affect Thought process is linear and is goal-directed.     Assessment & Plan:  Heart failure with reduced ejection fraction and diastolic dysfunction (HCC) Denies chest pain or shortness of breath at this time.  Denies lower extremity/dependent edema at this time.  Most recent echo with ejection fraction 30 to 35% with basal to septal V hypokinesis as well as anterior LV segments.  Grade 2 diastolic dysfunction, RV systolic  function mildly reduced with mildly elevated PA pressures.  Mild to moderate mitral regurg. Plan: Refill Coreg 6.25 mg twice daily Refill Zetia and statin Refill ASA 81 mg daily  Hypertension, essential Blood pressure elevated to 156/54 today.  He has not been taking any of his medications for the last few months. Plan: Refill Coreg 6.25 mg twice daily   PVD (peripheral vascular disease) Patient concerned with pain in his hands bilaterally.  He does have known PVD with left greater than right disease burden.  He states that when he is in dialysis and they put the blood pressure cuff on him he gets excruciating pain in the hands.  Endorses coldness in the hands as well as symptoms consistent with claudication.  On exam absent radial pulse of the left, weak on the right.  Pain with elevation of the arms.  Left left bruit appreciated. Plan: ASA, statin, Zetia Carotid duplex It seems that patient is already scheduled for vascular ultrasound of the upper extremities Please follow-up regarding this and next visit. 02/10/2023  1:30 PM at [place] [facility] [address]    Type 2 diabetes mellitus with diabetic peripheral angiopathy without gangrene, with long-term current use of insulin (HCC) Patient has not been on any of their medications including insulin for some time now.  Prior they were taking 15 units basal insulin nightly as well as 5 units prandial insulin.  A1c today 9.2.  Reviewed recent chemistry panels in conjunction with exam and history, little concern for acute hyperglycemic crisis at this time. Plan: Resume Lantus 15 units daily Refill lancets and test strips Patient to check blood sugars at home Follow-up 4 weeks for continued medication titration. Will hold off on resuming prandial insulin at this time, please assess need at next visit.   Neuropathy Exam today consistent with peripheral neuropathy of the hands.  He has previously been on Cymbalta and Lyrica for  this issue.  He is unable to tell me if these medications have helped him in the past, but having significant pain currently. Plan: Resume Lyrica, consider restarting Cymbalta at follow-up visit.   Above knee amputation of left lower extremity (HCC) Has a functional right knee prosthesis, but does not have a functional left knee prosthesis.  He says this has been an issue since he was fitted for the prosthesis to begin with, and states the prosthesis is too large for his leg and that he slips down into the prosthesis.  This causes instability and the patient is unable to ambulate with the prosthesis properly. Plan: DME referral to [facility] for prosthesis refitting   ESRD on dialysis [facility]) Continues to go to dialysis,'s Or transportation.  He says he does not have difficulties getting to dialysis.  He has a great deal of pain and dialysis due to his peripheral vascular disease, and states when the blood pressure cuff takes his pressures it is very painful.  Last dialysis session yesterday (12/21/2022). Plan: Continue dialysis, will attempt to help with care coordination for this patient with complicated medical needs. Care coordination  referral.   Disability affecting daily living Patient states he is able to do some things around the house, but has difficulty with ADLs.  He does not walk, and uses a wheelchair to ambulate at baseline.  He has challenges with mobility which make it so that he is unable to enter the bathroom easily, and requires help with bathing.  He is able to microwave foods for himself, but does not cook.  He needs help with cleaning the house as well.  He is able to transfer to the wheelchair from the bed himself in the mornings, but has a difficult time with the steps leading up to his apartment.  He relies on a bedside commode to urinate, as he is unable to get through the door to the bathroom.  Plan: Will fill out paperwork to see if patient qualifies for personal care  services Referral placed for care coordination Medication sent to [facility], requested they mail medications to him.    Patient seen with Dr. Willow Ora MD [facility] Internal Medicine  PGY-1 Pager: [phone]  Phone: [phone] Date 12/22/2022  Time 5:44 PM

## 2022-12-22 NOTE — Assessment & Plan Note (Addendum)
Patient has not been on any of their medications including insulin for some time now.  Prior they were taking 15 units basal insulin nightly as well as 5 units prandial insulin.  They feel comfortable with her ability to take her blood sugars, and manage their insulin.  Denies any episodes of hypoglycemia in the past.  A1c today 9.3.  Reviewed recent chemistry panels in conjunction with exam and history, little concern for acute hyperglycemic crisis at this time. Plan: Resume Lantus 15 units daily Refill lancets and test strips Patient to check blood sugars at home Follow-up 4 weeks for continued medication titration. Will hold off on resuming prandial insulin at this time, please assess need at next visit.

## 2022-12-22 NOTE — Assessment & Plan Note (Signed)
Patient concerned with pain in his hands bilaterally.  He does have known PVD with left greater than right disease burden.  He states that when he is in dialysis and they put the blood pressure cuff on him he gets excruciating pain in the hands.  Endorses coldness in the hands as well as symptoms consistent with claudication.  On exam absent radial pulse of the left, weak on the right.  Pain with elevation of the arms.  Left left bruit appreciated. Plan: ASA, statin, Zetia Carotid duplex It seems that patient is already scheduled for vascular ultrasound of the upper extremities Please follow-up regarding this and next visit. 02/10/2023  1:30 PM at [place] [facility] [address]

## 2022-12-22 NOTE — Assessment & Plan Note (Signed)
Has a functional right knee prosthesis, but does not have a functional left knee prosthesis.  He says this has been an issue since he was fitted for the prosthesis to begin with, and states the prosthesis is too large for his leg and that he slips down into the prosthesis.  This causes instability and the patient is unable to ambulate with the prosthesis properly. Plan: DME referral to [facility] for prosthesis refitting

## 2022-12-22 NOTE — Assessment & Plan Note (Signed)
Continues to go to dialysis,'s Or transportation.  He says he does not have difficulties getting to dialysis.  He has a great deal of pain and dialysis due to his peripheral vascular disease, and states when the blood pressure cuff takes his pressures it is very painful.  Last dialysis session yesterday (12/21/2022). Plan: Continue dialysis, will attempt to help with care coordination for this patient with complicated medical needs. Care coordination referral.

## 2022-12-22 NOTE — Assessment & Plan Note (Deleted)
Denies chest pain or shortness of breath at this time.  Denies lower extremity/dependent edema at this time.  Most recent echo with ejection fraction 30 to 35%.  Known CAD.

## 2022-12-22 NOTE — Assessment & Plan Note (Signed)
>>  ASSESSMENT AND PLAN FOR ABOVE KNEE AMPUTATION OF LEFT LOWER EXTREMITY (HCC) WRITTEN ON 12/22/2022  5:36 PM BY GABINO BOGA, MD  Has a functional right knee prosthesis, but does not have a functional left knee prosthesis.  He says this has been an issue since he was fitted for the prosthesis to begin with, and states the prosthesis is too large for his leg and that he slips down into the prosthesis.  This causes instability and the patient is unable to ambulate with the prosthesis properly. Plan: DME referral to [facility] for prosthesis refitting

## 2022-12-22 NOTE — Assessment & Plan Note (Signed)
Exam today consistent with peripheral neuropathy of the hands.  He has previously been on Cymbalta and Lyrica for this issue.  He is unable to tell me if these medications have helped him in the past, but having significant pain currently. Plan: Resume Lyrica, consider restarting Cymbalta at follow-up visit.

## 2022-12-22 NOTE — Assessment & Plan Note (Addendum)
Denies chest pain or shortness of breath at this time.  Denies lower extremity/dependent edema at this time.  Most recent echo with ejection fraction 30 to 35% with basal to septal V hypokinesis as well as anterior LV segments.  Grade 2 diastolic dysfunction, RV systolic function mildly reduced with mildly elevated PA pressures.  Mild to moderate mitral regurg. Plan: Refill Coreg 6.25 mg twice daily Refill Zetia and statin Refill ASA 81 mg daily

## 2022-12-22 NOTE — Patient Instructions (Signed)
Thank you, [name] for allowing [place] to provide your care today.  I have ordered the following tests for you:   Lab Orders         Glucose, capillary         POC Hbg A1C       Referrals ordered today:    Referral Orders         Ambulatory Referral for DME      I have ordered the following medication/changed the following medications:   Stop the following medications: Medications Discontinued During This Encounter  Medication Reason   nicotine (NICODERM CQ - DOSED IN MG/24 HOURS) 21 mg/24hr patch    varenicline (CHANTIX) 0.5 MG tablet    fluticasone (FLONASE) 50 MCG/ACT nasal spray    Continuous Blood Gluc Sensor (FREESTYLE LIBRE 2 SENSOR) MISC    Continuous Glucose Sensor (FREESTYLE LIBRE 2 SENSOR) MISC    traMADol (ULTRAM) 50 MG tablet    nicotine polacrilex (NICORETTE) 2 MG gum    aspirin EC 81 MG tablet Reorder   glucose blood (ACCU-CHEK AVIVA PLUS) test strip Reorder   Insulin Pen Needle 32G X 4 MM MISC Reorder   carvedilol (COREG) 6.25 MG tablet Reorder   rosuvastatin (CRESTOR) 10 MG tablet Reorder   insulin glargine (LANTUS) 100 UNIT/ML Solostar Pen Reorder   Accu-Chek FastClix Lancets MISC Reorder   pregabalin (LYRICA) 25 MG capsule Reorder   ezetimibe (ZETIA) 10 MG tablet Reorder     Start the following medications: Meds ordered this encounter  Medications   Accu-Chek FastClix Lancets MISC    Sig: Check blood sugar 3 (three) times daily.    Dispense:  102 each    Refill:  12    The patient is insulin requiring, ICD 10 code E11.9. The patient tests 3 times per day.   aspirin EC 81 MG tablet    Sig: Take 1 tablet (81 mg total) by mouth daily. Swallow whole.   ezetimibe (ZETIA) 10 MG tablet    Sig: Take 1 tablet (10 mg total) by mouth daily.    Dispense:  90 tablet    Refill:  3   glucose blood (ACCU-CHEK AVIVA PLUS) test strip    Sig: Check blood sugar 3 times a day    Dispense:  100 each    Refill:  12    The patient is insulin requiring, ICD 10  code E11.9. The patient tests 3 times per day.   insulin glargine (LANTUS) 100 UNIT/ML Solostar Pen    Sig: Inject 15 Units into the skin at bedtime.    Dispense:  15 mL    Refill:  1   rosuvastatin (CRESTOR) 10 MG tablet    Sig: Take 1 tablet (10 mg total) by mouth daily.    Dispense:  90 tablet    Refill:  2   carvedilol (COREG) 6.25 MG tablet    Sig: Take 1 tablet (6.25 mg total) by mouth 2 (two) times daily with meals for hypertension    Dispense:  60 tablet    Refill:  0   Insulin Pen Needle 32G X 4 MM MISC    Sig: Use to inject insulin 4 (four) times daily.    Dispense:  360 each    Refill:  3    The patient is insulin requiring, ICD 10 code E11.65.   pregabalin (LYRICA) 25 MG capsule    Sig: Take 1 capsule by mouth once a day    Dispense:  30 capsule  Refill:  0     Follow up:  4 weeks   for follow up on chronic conditions    We look forward to seeing you next time. Please call our clinic at [phone] if you have any questions or concerns. The best time to call is Monday-Friday from 9am-4pm, but there is someone available 24/7. If after hours or the weekend, call the main hospital number and ask for the Internal Medicine Resident On-Call. If you need medication refills, please notify your pharmacy one week in advance and they will send [place] a request.   Thank you for trusting me with your care. Wishing you the best!  Lovie Macadamia MD [facility]

## 2022-12-22 NOTE — Assessment & Plan Note (Signed)
Patient states he is able to do some things around the house, but has difficulty with ADLs.  He does not walk, and uses a wheelchair to ambulate at baseline.  He has challenges with mobility which make it so that he is unable to enter the bathroom easily, and requires help with bathing.  He is able to microwave foods for himself, but does not cook.  He needs help with cleaning the house as well.  He is able to transfer to the wheelchair from the bed himself in the mornings, but has a difficult time with the steps leading up to his apartment.  He relies on a bedside commode to urinate, as he is unable to get through the door to the bathroom.  Plan: Will fill out paperwork to see if patient qualifies for personal care services Referral placed for care coordination Medication sent to [facility], requested they mail medications to him.

## 2022-12-22 NOTE — Assessment & Plan Note (Signed)
Blood pressure elevated to 156/54 today.  He has not been taking any of his medications for the last few months. Plan: Refill Coreg 6.25 mg twice daily

## 2022-12-23 ENCOUNTER — Other Ambulatory Visit: Payer: Self-pay

## 2022-12-23 ENCOUNTER — Telehealth: Payer: Self-pay | Admitting: *Deleted

## 2022-12-23 ENCOUNTER — Other Ambulatory Visit (HOSPITAL_COMMUNITY): Payer: Self-pay

## 2022-12-23 MED ORDER — ACCU-CHEK GUIDE W/DEVICE KIT
PACK | 3 refills | Status: AC
Start: 2022-12-23 — End: ?
  Filled 2022-12-23: qty 1, 30d supply, fill #0
  Filled 2023-05-25: qty 1, 30d supply, fill #1
  Filled 2023-07-12: qty 1, 30d supply, fill #2
  Filled 2023-07-21 – 2023-12-15 (×4): qty 1, 30d supply, fill #3

## 2022-12-23 NOTE — Progress Notes (Signed)
Complex Care Management Note   12/23/2022 Name: [name] MRN: [id number] DOB: [birth year]  [name] is a 62 y.o. year old male who sees [name] Sheldon, [name] for primary care. I reached out to [name] by phone today to offer complex care management services.  [name] was given information about Complex Care Management services today including:   The Complex Care Management services include support from the care team which includes your Nurse Coordinator, Clinical Social Worker, or Pharmacist.  The Complex Care Management team is here to help remove barriers to the health concerns and goals most important to you. Complex Care Management services are voluntary, and the patient may decline or stop services at any time by request to their care team member.   Complex Care Management Consent Status: Patient agreed to services and verbal consent obtained.   Follow up plan:  Telephone appointment with complex care management team member scheduled for:  12/27  Encounter Outcome:  Patient Scheduled  [facility] Coordination Care Guide  Direct Dial: [phone]

## 2022-12-23 NOTE — Addendum Note (Signed)
Addended by: Lovie Macadamia on: 12/23/2022 08:59 AM   Modules accepted: Orders

## 2022-12-31 ENCOUNTER — Ambulatory Visit: Payer: Self-pay

## 2022-12-31 NOTE — Patient Outreach (Signed)
  Care Coordination   12/31/2022 Name: [name] MRN: [id number] DOB: 12-24-1960   Care Coordination Outreach Attempts:  An unsuccessful telephone outreach was attempted today to offer the patient information about available complex care management services.  Follow Up Plan:  Additional outreach attempts will be made to offer the patient complex care management information and services.   Encounter Outcome:  No Answer   Care Coordination Interventions:  No, not indicated    Lonzo Candy, BSN, [facility]  [facility], [facility]  Care Coordinator Phone: [phone]

## 2023-01-10 ENCOUNTER — Telehealth: Payer: Self-pay

## 2023-01-10 NOTE — Telephone Encounter (Signed)
 Forms has been completed and faxed to [facility] [employer] @ [phone]. Unable to reach pt, and unable to LVM. Forms is ready for pick up.

## 2023-01-18 ENCOUNTER — Telehealth: Payer: Self-pay | Admitting: Student

## 2023-01-18 NOTE — Telephone Encounter (Signed)
 Received after-hours page.  Returned call to patient and confirmed DOB.  Patient reports sharp left stump pain that started this morning that is episodic. Denies any hip pain or trouble with ROM.  History of left AKA in 05/2022.  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 Got HD today. Denies any open wounds.  Denies any bleeding or purulent drainage.  Denies any fever.  Denies any recent injury or trauma.  Reports having prosthetics for amputation but not wearing it and uses a wheelchair.  Reports feeling as phantom pain.  Has tried Tylenol  500 mg today with minimal relief.  Patient was recently restarted on Lyrica  25 mg daily on 12/18.  Patient and patient's friend report not taking his medications consistently.  He has not taken the Lyrica  along with some of his other chronic meds.  Advised patient to try Tylenol  1000 mg every 8 hours along with starting his Lyrica .  Patient to call [facility] tomorrow to schedule follow-up from his last OV on 12/18.  Return precautions discussed with patient and patient's friend over phone to seek UC/ED if worsening. Patient verbalizes understanding of plan and all questions addressed.

## 2023-01-26 ENCOUNTER — Telehealth: Payer: Self-pay | Admitting: *Deleted

## 2023-01-26 NOTE — Telephone Encounter (Signed)
Call fro   patient requesting an appointment.  Is having problems with swelling in his hand after getting blood pressures [facility] the [facility].  Patient states this has been happening for a while and has mentioned it to the staff at Dialysis.  Patient is requesting an appointment t be seen in the Clinics as his hands are getting ugly.  Patient was transferred to the front for an appointment. Patient given an appointment for 01/28/2023 at 10:15 AM.  Message was left on answering machine to call to confirm.  Appointments also available for tomorrow but unsure as to when he goes to Dialysis.

## 2023-02-02 ENCOUNTER — Emergency Department (HOSPITAL_COMMUNITY)
Admission: EM | Admit: 2023-02-02 | Discharge: 2023-02-02 | Disposition: A | Discharge status: Home / Self Care | Payer: 59 | Attending: Emergency Medicine | Admitting: Emergency Medicine

## 2023-02-02 ENCOUNTER — Other Ambulatory Visit: Payer: Self-pay

## 2023-02-02 ENCOUNTER — Emergency Department (HOSPITAL_COMMUNITY): Payer: 59

## 2023-02-02 ENCOUNTER — Encounter (HOSPITAL_COMMUNITY): Payer: Self-pay | Admitting: Pharmacy Technician

## 2023-02-02 DIAGNOSIS — Z7982 Long term (current) use of aspirin: Secondary | ICD-10-CM | POA: Insufficient documentation

## 2023-02-02 DIAGNOSIS — E119 Type 2 diabetes mellitus without complications: Secondary | ICD-10-CM | POA: Insufficient documentation

## 2023-02-02 DIAGNOSIS — R531 Weakness: Secondary | ICD-10-CM | POA: Insufficient documentation

## 2023-02-02 DIAGNOSIS — N186 End stage renal disease: Secondary | ICD-10-CM | POA: Diagnosis not present

## 2023-02-02 DIAGNOSIS — Z794 Long term (current) use of insulin: Secondary | ICD-10-CM | POA: Diagnosis not present

## 2023-02-02 LAB — CBC
HCT: 37.7 % — ABNORMAL LOW (ref 39.0–52.0)
Hemoglobin: 12.2 g/dL — ABNORMAL LOW (ref 13.0–17.0)
MCH: 30.3 pg (ref 26.0–34.0)
MCHC: 32.4 g/dL (ref 30.0–36.0)
MCV: 93.8 fL (ref 80.0–100.0)
Platelets: 137 10*3/uL — ABNORMAL LOW (ref 150–400)
RBC: 4.02 MIL/uL — ABNORMAL LOW (ref 4.22–5.81)
RDW: 13 % (ref 11.5–15.5)
WBC: 6.5 10*3/uL (ref 4.0–10.5)
nRBC: 0 % (ref 0.0–0.2)

## 2023-02-02 LAB — COMPREHENSIVE METABOLIC PANEL
ALT: 18 U/L (ref 0–44)
AST: 32 U/L (ref 15–41)
Albumin: 3 g/dL — ABNORMAL LOW (ref 3.5–5.0)
Alkaline Phosphatase: 58 U/L (ref 38–126)
Anion gap: 16 — ABNORMAL HIGH (ref 5–15)
BUN: 57 mg/dL — ABNORMAL HIGH (ref 8–23)
CO2: 16 mmol/L — ABNORMAL LOW (ref 22–32)
Calcium: 7.5 mg/dL — ABNORMAL LOW (ref 8.9–10.3)
Chloride: 101 mmol/L (ref 98–111)
Creatinine, Ser: 6.58 mg/dL — ABNORMAL HIGH (ref 0.61–1.24)
GFR, Estimated: 9 mL/min — ABNORMAL LOW (ref >60)
Glucose, Bld: 194 mg/dL — ABNORMAL HIGH (ref 70–99)
Potassium: 4.5 mmol/L (ref 3.5–5.1)
Sodium: 133 mmol/L — ABNORMAL LOW (ref 135–145)
Total Bilirubin: 1 mg/dL (ref 0.0–1.2)
Total Protein: 6.7 g/dL (ref 6.5–8.1)

## 2023-02-02 LAB — I-STAT CHEM 8, ED
BUN: 59 mg/dL — ABNORMAL HIGH (ref 8–23)
Calcium, Ion: 0.96 mmol/L — ABNORMAL LOW (ref 1.15–1.40)
Chloride: 101 mmol/L (ref 98–111)
Creatinine, Ser: 6.8 mg/dL — ABNORMAL HIGH (ref 0.61–1.24)
Glucose, Bld: 194 mg/dL — ABNORMAL HIGH (ref 70–99)
HCT: 37 % — ABNORMAL LOW (ref 39.0–52.0)
Hemoglobin: 12.6 g/dL — ABNORMAL LOW (ref 13.0–17.0)
Potassium: 4.5 mmol/L (ref 3.5–5.1)
Sodium: 134 mmol/L — ABNORMAL LOW (ref 135–145)
TCO2: 19 mmol/L — ABNORMAL LOW (ref 22–32)

## 2023-02-02 NOTE — Progress Notes (Signed)
CSW spoke with patient who states he lives alone but his family comes to his home and helps him with his care. Patients states his family will provide food if needed. CSW asked patient if he has a PCP. [facility]. Patient states he uses [facility] transportation to get to medical appointments. CSW provided personal care services forms to patient to get filled out by his PCP.  CSW explained to patient if medicaid approves him for [facility] then he possibly will be able to get a nurse to come to his home daily. CSW attempted to contact patients sister Haywood Filler to go over discharge instructions. CSW left an voicemail requesting a call back.

## 2023-02-02 NOTE — Progress Notes (Signed)
CSW received a phone call back from patients sister Haywood Filler [phone]. Patients sister stated that patient was denied for personal care services 2-3 weeks ago. [name] stated that she doesn't understand how they could do that. CSW offered to provide assisted living resources that accept Medicaid. [name] stated that they have tried to get patient into a facility but he refuses to go because he doesn't want to give up his check. CSW also suggested paying privately for an in home nurse if they are able to. [name] told CSW that the family doesn't have enough money to pay for those services. [name] states she plans on continuing to see if he would agree to go to an assisted living.

## 2023-02-02 NOTE — Care Management (Signed)
Patient is 63yo M with bil BKA, ESRD on HD , T/TH/Sat at [facility] presented to the ED for failure to thrive.  ED RNCM received handoff HH recommendations. ED RNCM met with patient to discuss recommendations for [facility] services. Patient reports living alone but having assistance from family with meals and ADL's. Denies having personal care serivces at this time, reviewed prior notes from PCP which stated that forms were prepared and faxed out for CAPS patient requested a copy, a printed copy was given to patient. Encouraged patient and family to follow up with CAPS services.  Patient is agreeable to [facility] services., Patient states he has had HH in the past with [facility] but prefer to use another agency. He verbalized not having a preference other than not [facility].  [employer], sent referral to [facility] with [facility], referral was accepted. Explained that the nurse will contact him 24-48 hours post discharge to arrange the initial assessment visit. Reminded him he schedule for HD tomorrow at his home HD center. Patient will be transported home by [facility]. No further ED RNCM needs identified.   Michel Bickers RN, BSN CNOR ED RN Care Manager [phone]

## 2023-02-02 NOTE — ED Triage Notes (Signed)
Pt ib ems failure to thrive.  missed dialysis, pt was covered in feces. Last dialysis the 21st of this month.

## 2023-02-02 NOTE — ED Provider Notes (Signed)
[place] EMERGENCY DEPARTMENT AT [facility] Provider Note   CSN: [id number] Arrival date & time: 02/02/23  4098     History  No chief complaint on file.  HPI [name] is a 63 y.o. male with history of ESRD, NSTEMI, type 2 diabetes, PVD, bilateral lower extremity amputee presenting for weakness. States this been going on for about a week.  States his last dialysis was on the 21st. States he did fall 2 days ago attempting to go to the bathroom.  Denies hitting his head.  States that he is generally weak. Denies chest pain, shortness of breath and abdominal pain.  States he is having difficulty getting to the bathroom and has been urinating and having bowel movements in his bed.   HPI     Home Medications Prior to Admission medications   Medication Sig Start Date End Date Taking? Authorizing Provider  Accu-Chek Softclix Lancets lancets Use to check blood sugar 3 (three) times daily. 12/22/22   Lovie Macadamia, MD  aspirin EC 81 MG tablet Take 1 tablet (81 mg total) by mouth daily. Swallow whole. 12/22/22   Lovie Macadamia, MD  Blood Glucose Monitoring Suppl (ACCU-CHEK GUIDE) w/Device KIT Use to check your blood sugar three times daily. 12/23/22   Lovie Macadamia, MD  carvedilol (COREG) 6.25 MG tablet Take 1 tablet (6.25 mg total) by mouth 2 (two) times daily with meals for hypertension 12/22/22   Lovie Macadamia, MD  diclofenac Sodium (VOLTAREN) 1 % GEL Apply 4 g topically 4 (four) times daily. 06/29/22   Rocky Morel, DO  DULoxetine (CYMBALTA) 60 MG capsule Take 1 capsule (60 mg total) by mouth daily. 06/29/22   Rocky Morel, DO  ezetimibe (ZETIA) 10 MG tablet Take 1 tablet (10 mg total) by mouth daily. 12/22/22   Lovie Macadamia, MD  glucose blood (ACCU-CHEK AVIVA PLUS) test strip Check blood sugar 3 times a day 12/22/22   Lovie Macadamia, MD  insulin glargine (LANTUS) 100 UNIT/ML Solostar Pen Inject 15 Units into the skin at bedtime. Please ship to  patient. Thank you 12/22/22   Lovie Macadamia, MD  insulin lispro (HUMALOG) 100 UNIT/ML KwikPen Inject 5 Units into the skin 3 (three) times daily with meals. 06/28/22   Katsadouros, Vasilios, MD  Insulin Pen Needle 32G X 4 MM MISC Use to inject insulin 4 (four) times daily. 12/22/22   Lovie Macadamia, MD  melatonin (KP MELATONIN) 3 MG TABS tablet Take 1 tablet (3 mg total) by mouth at bedtime for insomnia. 09/10/21     pregabalin (LYRICA) 25 MG capsule Take 1 capsule by mouth once a day 12/22/22   Lovie Macadamia, MD  rosuvastatin (CRESTOR) 10 MG tablet Take 1 tablet (10 mg total) by mouth daily. 12/22/22   Lovie Macadamia, MD      Allergies    Benicar [olmesartan]    Review of Systems   Review of Systems  Physical Exam Updated Vital Signs BP (!) 150/84   Pulse 85   Temp 98.6 F (37 C) (Oral)   Resp (!) 32   SpO2 95%  Physical Exam  ED Results / Procedures / Treatments   Labs (all labs ordered are listed, but only abnormal results are displayed) Labs Reviewed  COMPREHENSIVE METABOLIC PANEL - Abnormal; Notable for the following components:      Result Value   Sodium 133 (*)    CO2 16 (*)    Glucose, Bld 194 (*)    BUN 57 (*)    Creatinine, Ser  6.58 (*)    Calcium 7.5 (*)    Albumin 3.0 (*)    GFR, Estimated 9 (*)    Anion gap 16 (*)    All other components within normal limits  CBC - Abnormal; Notable for the following components:   RBC 4.02 (*)    Hemoglobin 12.2 (*)    HCT 37.7 (*)    Platelets 137 (*)    All other components within normal limits  I-STAT CHEM 8, ED - Abnormal; Notable for the following components:   Sodium 134 (*)    BUN 59 (*)    Creatinine, Ser 6.80 (*)    Glucose, Bld 194 (*)    Calcium, Ion 0.96 (*)    TCO2 19 (*)    Hemoglobin 12.6 (*)    HCT 37.0 (*)    All other components within normal limits  URINALYSIS, W/ REFLEX TO CULTURE (INFECTION SUSPECTED)    EKG EKG Interpretation Date/Time:  Wednesday February 02 2023 12:00:43  EST Ventricular Rate:  83 PR Interval:  134 QRS Duration:  92 QT Interval:  382 QTC Calculation: 449 R Axis:   34  Text Interpretation: Sinus rhythm Abnormal R-wave progression, early transition LVH with secondary repolarization abnormality ST-t wave abnormality Confirmed by Gerhard Munch [phone]) on 02/02/2023 1:51:50 PM  Radiology DG Chest Port 1 View Result Date: 02/02/2023 CLINICAL DATA:  Weakness. EXAM: PORTABLE CHEST 1 VIEW COMPARISON:  Chest radiograph dated 08/24/2022. FINDINGS: Right IJ dialysis catheter in similar position. No focal consolidation, pleural effusion, or pneumothorax. The cardiac silhouette is within normal limits. No acute osseous pathology. IMPRESSION: No active disease. Electronically Signed   By: Elgie Collard M.D.   On: 02/02/2023 11:10    Procedures Procedures    Medications Ordered in ED Medications - No data to display  ED Course/ Medical Decision Making/ A&P Clinical Course as of 02/02/23 1354  Wed Feb 02, 2023  1307 DG Chest [place] 1 View [JR]    Clinical Course User Index [JR] Gareth Eagle, PA-C                                 Medical Decision Making Amount and/or Complexity of Data Reviewed Labs: ordered. Radiology: ordered. Decision-making details documented in ED Course.   Initial Impression and Ddx 63 year old with appearing male presenting for weakness.  Exam was unremarkable.  DDx includes volume overload, hyperkalemia, other electrolyte derangement, sepsis, stroke, other. Patient PMH that increases complexity of ED encounter:  ESRD, NSTEMI, type 2 diabetes, PVD, bilateral lower extremity amputee  Interpretation of Diagnostics - I independent reviewed and interpreted the labs as followed: reduced GRF  - I independently visualized the following imaging with scope of interpretation limited to determining acute life threatening conditions related to emergency care: cxr, which revealed no acute findings  -I personally reviewed  and interpreted EKG which revealed sinus rhythm  Patient Reassessment and Ultimate Disposition/Management On reassessment, patient remained well-appearing, no acute distress and hemodynamically stable.  Workup thus far reassuring.  Did consult social work for home health needs as patient seems to be having difficulty taking care of himself at home.  States he lives by himself.  Workup does not suggest sepsis or volume overload.  Electrolytes are stable.  Discussed pertinent return precautions.  Vital stable discharge good condition.  Advised that he continue going to dialysis as scheduled.  Advised to follow-up with his PCP.  Patient management required discussion  with the following services or consulting groups:  None  Complexity of Problems Addressed Acute complicated illness or Injury  Additional Data Reviewed and Analyzed Further history obtained from: Past medical history and medications listed in the EMR and Prior ED visit notes  Patient Encounter Risk Assessment None         Final Clinical Impression(s) / ED Diagnoses Final diagnoses:  Weakness    Rx / DC Orders ED Discharge Orders     None         Gareth Eagle, PA-C 02/02/23 1355    Gerhard Munch, MD 02/05/23 1620

## 2023-02-02 NOTE — Discharge Instructions (Signed)
Evaluation today was overall reassuring.  Recommend that you do go to your dialysis as scheduled.  Also recommend he follow-up with your PCP.  If you have worsening symptoms, develop chest pain or abdominal pain or fever or any other concerning symptom please return to the emergency department further evaluation.

## 2023-02-03 ENCOUNTER — Emergency Department (HOSPITAL_COMMUNITY): Payer: 59

## 2023-02-03 ENCOUNTER — Inpatient Hospital Stay (HOSPITAL_COMMUNITY)
Admission: EM | Admit: 2023-02-03 | Discharge: 2023-02-11 | DRG: 871 | Disposition: A | Discharge status: Skilled Nursing Facility | Payer: 59 | Attending: Internal Medicine | Admitting: Internal Medicine

## 2023-02-03 ENCOUNTER — Telehealth: Payer: Self-pay

## 2023-02-03 DIAGNOSIS — Z1152 Encounter for screening for COVID-19: Secondary | ICD-10-CM

## 2023-02-03 DIAGNOSIS — N186 End stage renal disease: Secondary | ICD-10-CM | POA: Diagnosis present

## 2023-02-03 DIAGNOSIS — Z89612 Acquired absence of left leg above knee: Secondary | ICD-10-CM

## 2023-02-03 DIAGNOSIS — Z888 Allergy status to other drugs, medicaments and biological substances status: Secondary | ICD-10-CM

## 2023-02-03 DIAGNOSIS — Z79899 Other long term (current) drug therapy: Secondary | ICD-10-CM

## 2023-02-03 DIAGNOSIS — A4189 Other specified sepsis: Principal | ICD-10-CM | POA: Diagnosis present

## 2023-02-03 DIAGNOSIS — I10 Essential (primary) hypertension: Secondary | ICD-10-CM | POA: Diagnosis present

## 2023-02-03 DIAGNOSIS — Z8701 Personal history of pneumonia (recurrent): Secondary | ICD-10-CM

## 2023-02-03 DIAGNOSIS — G9340 Encephalopathy, unspecified: Principal | ICD-10-CM

## 2023-02-03 DIAGNOSIS — R7989 Other specified abnormal findings of blood chemistry: Secondary | ICD-10-CM | POA: Diagnosis present

## 2023-02-03 DIAGNOSIS — G9341 Metabolic encephalopathy: Secondary | ICD-10-CM | POA: Diagnosis present

## 2023-02-03 DIAGNOSIS — D696 Thrombocytopenia, unspecified: Secondary | ICD-10-CM | POA: Diagnosis present

## 2023-02-03 DIAGNOSIS — J09X2 Influenza due to identified novel influenza A virus with other respiratory manifestations: Secondary | ICD-10-CM | POA: Insufficient documentation

## 2023-02-03 DIAGNOSIS — R4182 Altered mental status, unspecified: Secondary | ICD-10-CM | POA: Diagnosis present

## 2023-02-03 DIAGNOSIS — E1151 Type 2 diabetes mellitus with diabetic peripheral angiopathy without gangrene: Secondary | ICD-10-CM | POA: Diagnosis present

## 2023-02-03 DIAGNOSIS — Z89511 Acquired absence of right leg below knee: Secondary | ICD-10-CM

## 2023-02-03 DIAGNOSIS — R197 Diarrhea, unspecified: Secondary | ICD-10-CM | POA: Diagnosis present

## 2023-02-03 DIAGNOSIS — Z992 Dependence on renal dialysis: Secondary | ICD-10-CM

## 2023-02-03 DIAGNOSIS — Z794 Long term (current) use of insulin: Secondary | ICD-10-CM

## 2023-02-03 DIAGNOSIS — E785 Hyperlipidemia, unspecified: Secondary | ICD-10-CM | POA: Diagnosis present

## 2023-02-03 DIAGNOSIS — F17210 Nicotine dependence, cigarettes, uncomplicated: Secondary | ICD-10-CM | POA: Diagnosis present

## 2023-02-03 DIAGNOSIS — I132 Hypertensive heart and chronic kidney disease with heart failure and with stage 5 chronic kidney disease, or end stage renal disease: Secondary | ICD-10-CM | POA: Diagnosis present

## 2023-02-03 DIAGNOSIS — Z833 Family history of diabetes mellitus: Secondary | ICD-10-CM

## 2023-02-03 DIAGNOSIS — N19 Unspecified kidney failure: Secondary | ICD-10-CM | POA: Insufficient documentation

## 2023-02-03 DIAGNOSIS — E872 Acidosis, unspecified: Secondary | ICD-10-CM | POA: Diagnosis present

## 2023-02-03 DIAGNOSIS — R8281 Pyuria: Secondary | ICD-10-CM | POA: Diagnosis present

## 2023-02-03 DIAGNOSIS — D631 Anemia in chronic kidney disease: Secondary | ICD-10-CM | POA: Diagnosis present

## 2023-02-03 DIAGNOSIS — I5022 Chronic systolic (congestive) heart failure: Secondary | ICD-10-CM | POA: Diagnosis present

## 2023-02-03 DIAGNOSIS — G8929 Other chronic pain: Secondary | ICD-10-CM | POA: Diagnosis present

## 2023-02-03 DIAGNOSIS — Z91158 Patient's noncompliance with renal dialysis for other reason: Secondary | ICD-10-CM

## 2023-02-03 DIAGNOSIS — G4700 Insomnia, unspecified: Secondary | ICD-10-CM | POA: Diagnosis present

## 2023-02-03 DIAGNOSIS — Z7982 Long term (current) use of aspirin: Secondary | ICD-10-CM

## 2023-02-03 DIAGNOSIS — K219 Gastro-esophageal reflux disease without esophagitis: Secondary | ICD-10-CM | POA: Diagnosis present

## 2023-02-03 DIAGNOSIS — Z8249 Family history of ischemic heart disease and other diseases of the circulatory system: Secondary | ICD-10-CM

## 2023-02-03 DIAGNOSIS — J101 Influenza due to other identified influenza virus with other respiratory manifestations: Secondary | ICD-10-CM | POA: Insufficient documentation

## 2023-02-03 DIAGNOSIS — N2581 Secondary hyperparathyroidism of renal origin: Secondary | ICD-10-CM | POA: Diagnosis present

## 2023-02-03 DIAGNOSIS — I252 Old myocardial infarction: Secondary | ICD-10-CM

## 2023-02-03 DIAGNOSIS — Z751 Person awaiting admission to adequate facility elsewhere: Secondary | ICD-10-CM

## 2023-02-03 DIAGNOSIS — E1122 Type 2 diabetes mellitus with diabetic chronic kidney disease: Secondary | ICD-10-CM | POA: Diagnosis present

## 2023-02-03 DIAGNOSIS — E8889 Other specified metabolic disorders: Secondary | ICD-10-CM | POA: Diagnosis present

## 2023-02-03 LAB — CBC
HCT: 35.8 % — ABNORMAL LOW (ref 39.0–52.0)
Hemoglobin: 12.2 g/dL — ABNORMAL LOW (ref 13.0–17.0)
MCH: 30.7 pg (ref 26.0–34.0)
MCHC: 34.1 g/dL (ref 30.0–36.0)
MCV: 90.2 fL (ref 80.0–100.0)
Platelets: 153 10*3/uL (ref 150–400)
RBC: 3.97 MIL/uL — ABNORMAL LOW (ref 4.22–5.81)
RDW: 12.8 % (ref 11.5–15.5)
WBC: 9.7 10*3/uL (ref 4.0–10.5)
nRBC: 0 % (ref 0.0–0.2)

## 2023-02-03 LAB — COMPREHENSIVE METABOLIC PANEL
ALT: 22 U/L (ref 0–44)
AST: 33 U/L (ref 15–41)
Albumin: 3.5 g/dL (ref 3.5–5.0)
Alkaline Phosphatase: 57 U/L (ref 38–126)
Anion gap: 17 — ABNORMAL HIGH (ref 5–15)
BUN: 84 mg/dL — ABNORMAL HIGH (ref 8–23)
CO2: 19 mmol/L — ABNORMAL LOW (ref 22–32)
Calcium: 8 mg/dL — ABNORMAL LOW (ref 8.9–10.3)
Chloride: 97 mmol/L — ABNORMAL LOW (ref 98–111)
Creatinine, Ser: 8.84 mg/dL — ABNORMAL HIGH (ref 0.61–1.24)
GFR, Estimated: 6 mL/min — ABNORMAL LOW (ref >60)
Glucose, Bld: 147 mg/dL — ABNORMAL HIGH (ref 70–99)
Potassium: 4.6 mmol/L (ref 3.5–5.1)
Sodium: 133 mmol/L — ABNORMAL LOW (ref 135–145)
Total Bilirubin: 0.7 mg/dL (ref 0.0–1.2)
Total Protein: 7.4 g/dL (ref 6.5–8.1)

## 2023-02-03 LAB — I-STAT CHEM 8, ED
BUN: 93 mg/dL — ABNORMAL HIGH (ref 8–23)
Calcium, Ion: 0.95 mmol/L — ABNORMAL LOW (ref 1.15–1.40)
Chloride: 98 mmol/L (ref 98–111)
Creatinine, Ser: 9.5 mg/dL — ABNORMAL HIGH (ref 0.61–1.24)
Glucose, Bld: 145 mg/dL — ABNORMAL HIGH (ref 70–99)
HCT: 37 % — ABNORMAL LOW (ref 39.0–52.0)
Hemoglobin: 12.6 g/dL — ABNORMAL LOW (ref 13.0–17.0)
Potassium: 4.7 mmol/L (ref 3.5–5.1)
Sodium: 133 mmol/L — ABNORMAL LOW (ref 135–145)
TCO2: 21 mmol/L — ABNORMAL LOW (ref 22–32)

## 2023-02-03 LAB — RESP PANEL BY RT-PCR (RSV, FLU A&B, COVID)  RVPGX2
Influenza A by PCR: POSITIVE — AB
Influenza B by PCR: NEGATIVE
Resp Syncytial Virus by PCR: NEGATIVE
SARS Coronavirus 2 by RT PCR: NEGATIVE

## 2023-02-03 LAB — ETHANOL: Alcohol, Ethyl (B): <10 mg/dL (ref <10)

## 2023-02-03 LAB — PROTIME-INR
INR: 1.1 (ref 0.8–1.2)
Prothrombin Time: 14.9 s (ref 11.4–15.2)

## 2023-02-03 LAB — I-STAT CG4 LACTIC ACID, ED: Lactic Acid, Venous: 1.2 mmol/L (ref 0.5–1.9)

## 2023-02-03 LAB — AMMONIA: Ammonia: 14 umol/L (ref 9–35)

## 2023-02-03 LAB — D-DIMER, QUANTITATIVE: D-Dimer, Quant: 2.32 ug{FEU}/mL — ABNORMAL HIGH (ref 0.00–0.50)

## 2023-02-03 LAB — APTT: aPTT: 36 s (ref 24–36)

## 2023-02-03 MED ORDER — HEPARIN SODIUM (PORCINE) 5000 UNIT/ML IJ SOLN
5000.0000 [IU] | Freq: Three times a day (TID) | INTRAMUSCULAR | Status: DC
  Administered 2023-02-04 – 2023-02-11 (×21): 5000 [IU] via SUBCUTANEOUS
  Filled 2023-02-03 (×22): qty 1

## 2023-02-03 MED ORDER — METRONIDAZOLE 500 MG/100ML IV SOLN
500.0000 mg | Freq: Once | INTRAVENOUS | Status: AC
  Administered 2023-02-03: 500 mg via INTRAVENOUS
  Filled 2023-02-03: qty 100

## 2023-02-03 MED ORDER — ACETAMINOPHEN 650 MG RE SUPP
650.0000 mg | Freq: Once | RECTAL | Status: AC
  Administered 2023-02-03: 650 mg via RECTAL
  Filled 2023-02-03: qty 1

## 2023-02-03 MED ORDER — SODIUM CHLORIDE 0.9 % IV SOLN
1.0000 g | Freq: Once | INTRAVENOUS | Status: DC
  Filled 2023-02-03: qty 10

## 2023-02-03 MED ORDER — LACTATED RINGERS IV SOLN: INTRAVENOUS | Status: DC

## 2023-02-03 MED ORDER — SODIUM CHLORIDE 0.9 % IV SOLN
2.0000 g | Freq: Once | INTRAVENOUS | Status: AC
  Administered 2023-02-03: 2 g via INTRAVENOUS
  Filled 2023-02-03: qty 12.5

## 2023-02-03 MED ORDER — SODIUM CHLORIDE 0.9 % IV BOLUS
500.0000 mL | Freq: Once | INTRAVENOUS | Status: AC
  Administered 2023-02-03: 500 mL via INTRAVENOUS

## 2023-02-03 MED ORDER — VANCOMYCIN HCL 1500 MG/300ML IV SOLN
1500.0000 mg | Freq: Once | INTRAVENOUS | Status: AC
  Administered 2023-02-03: 1500 mg via INTRAVENOUS
  Filled 2023-02-03: qty 300

## 2023-02-03 NOTE — Sepsis Progress Note (Signed)
Notified RN to draw lactic acid and blood cultures so antibiotics can be given, per RN pt is difficult stick  so IV team called

## 2023-02-03 NOTE — ED Triage Notes (Signed)
BIB [facility] for possible sepsis, pt was found to be tachypnea with cough. Pt minimally responds to questions, and unable to follow commands.

## 2023-02-03 NOTE — ED Notes (Signed)
Please cancel 2nd lactic not needed, 1st in normal range

## 2023-02-03 NOTE — Transitions of Care (Post Inpatient/ED Visit) (Signed)
02/03/2023  Name: [name] MRN: [id number] DOB: 1960/12/04  Today's TOC FU Call Status: Today's TOC FU Call Status:: Successful TOC FU Call Completed TOC FU Call Complete Date: 02/03/23 Patient's Name and Date of Birth confirmed.  Transition Care Management Follow-up Telephone Call Date of Discharge: 02/02/23 Discharge Facility: [facility]) Type of Discharge: Emergency Department Reason for ED Visit: Other: (weakness) How have you been since you were released from the hospital?: Better Any questions or concerns?: No  Items Reviewed: Did you receive and understand the discharge instructions provided?: Yes Medications obtained,verified, and reconciled?: Yes (Medications Reviewed) Any new allergies since your discharge?: No Dietary orders reviewed?: Yes  Medications Reviewed Today: Medications Reviewed Today     Reviewed by Karena Addison, LPN (Licensed Practical Nurse) on 02/03/23 at 1226  Med List Status: <None>   Medication Order Taking? Sig Documenting Provider Last Dose Status Informant  Accu-Chek Softclix Lancets lancets [id number]  Use to check blood sugar 3 (three) times daily. Lovie Macadamia, MD  Active   aspirin EC 81 MG tablet [id number]  Take 1 tablet (81 mg total) by mouth daily. Swallow whole. Lovie Macadamia, MD  Active   Blood Glucose Monitoring Suppl (ACCU-CHEK GUIDE) w/Device KIT [id number]  Use to check your blood sugar three times daily. Lovie Macadamia, MD  Active   carvedilol (COREG) 6.25 MG tablet [id number]  Take 1 tablet (6.25 mg total) by mouth 2 (two) times daily with meals for hypertension Lovie Macadamia, MD  Active   diclofenac Sodium (VOLTAREN) 1 % GEL [id number]  Apply 4 g topically 4 (four) times daily. Rocky Morel, DO  Active   DULoxetine (CYMBALTA) 60 MG capsule [id number]  Take 1 capsule (60 mg total) by mouth daily. Rocky Morel, DO  Active   ezetimibe (ZETIA) 10 MG tablet [id number]  Take 1 tablet (10 mg total) by mouth  daily. Lovie Macadamia, MD  Active   glucose blood (ACCU-CHEK AVIVA PLUS) test strip [id number]  Check blood sugar 3 times a day Lovie Macadamia, MD  Active   insulin glargine (LANTUS) 100 UNIT/ML Solostar Pen [id number]  Inject 15 Units into the skin at bedtime. Please ship to patient. Thank you Lovie Macadamia, MD  Active   insulin lispro (HUMALOG) 100 UNIT/ML KwikPen [id number]  Inject 5 Units into the skin 3 (three) times daily with meals. Belva Agee, MD  Active   Insulin Pen Needle 32G X 4 MM MISC [id number]  Use to inject insulin 4 (four) times daily. Lovie Macadamia, MD  Active   melatonin (KP MELATONIN) 3 MG TABS tablet [id number] No Take 1 tablet (3 mg total) by mouth at bedtime for insomnia.  Taking Active   pregabalin (LYRICA) 25 MG capsule [id number]  Take 1 capsule by mouth once a day Lovie Macadamia, MD  Active   rosuvastatin (CRESTOR) 10 MG tablet [id number]  Take 1 tablet (10 mg total) by mouth daily. Lovie Macadamia, MD  Active             Home Care and Equipment/Supplies: Were Home Health Services Ordered?: NA Any new equipment or medical supplies ordered?: NA  Functional Questionnaire: Do you need assistance with bathing/showering or dressing?: No Do you need assistance with meal preparation?: No Do you need assistance with eating?: No Do you have difficulty maintaining continence: No Do you need assistance with getting out of bed/getting out of a chair/moving?: No Do you have difficulty managing or taking your medications?: No  Follow up appointments reviewed: PCP Follow-up appointment confirmed?: Yes  Date of PCP follow-up appointment?: 02/04/23 Follow-up Provider: [facility] Follow-up appointment confirmed?: NA Do you need transportation to your follow-up appointment?: No Do you understand care options if your condition(s) worsen?: Yes-patient verbalized understanding    SIGNATURE Karena Addison, LPN [facility] Health  Advisor Direct Dial [phone]

## 2023-02-03 NOTE — ED Notes (Signed)
This Rn and another Rn attempted to start line on patient without any success, only able to obtain one aerobic culture and no other blood work, IV team consulted

## 2023-02-03 NOTE — Sepsis Progress Note (Signed)
Elink monitoring for the code sepsis protocol.

## 2023-02-03 NOTE — Progress Notes (Addendum)
ED Pharmacy Antibiotic Sign Off An antibiotic consult was received from an ED provider for vancomycin and cefepime per pharmacy dosing for sepsis. A chart review was completed to assess appropriateness.   The following one time order(s) were placed:  Cefepime 2g IV x1 Vancomycin 1500mg  IV x1  Further antibiotic and/or antibiotic pharmacy consults should be ordered by the admitting provider if indicated.   Thank you for allowing pharmacy to be a part of this patient's care.   Jenita Seashore, [facility]  Clinical Pharmacist 02/03/23 3:36 PM

## 2023-02-03 NOTE — Hospital Course (Addendum)
[name] is a 63 y.o. male with a history of ESRD on HD TTS, PAD s/p bilateral AKA, T2DM who presented with confusion and admitted for altered mental status.   Acute metabolic encephalopathy  Suspected Uremia in setting of ESRD on HD NAGMA + AGMA Flu A positive Presented with shortness of breath and altered mental status.  Noted to have missed dialysis with last HD on 1/25.  Presented febrile, without leukocytosis.  CMP remarkable for elevated BUN and creatinine above baseline.  Flu A positive. Mentation improved over course after HD.  Stable on room air.  Supportive measures for flu. CT head and CXR without acute findings. CT A/P with interstitial thickening and small left pleural effusion. Received HD.  ***AGMA/NAGMA has ***   Sepsis 2/2 Flu A Febrile to 101.1 on admission. Positive for the flu, afebrile, satting on room air. Lactic acid normal. No respiratory failure, did not start Tamiflu. Continued supportive care.     ESRD HD TTS States that he goes on T/Th/Sat schedule.  Per chart review, last time he was dialyzed was on 01/25.  Resumed HD sessions here.     PAD s/p bilateral AKA Elevated D-dimer Elevated D-dimer of 2.32, V/Q perfusion scan ordered by EDP to rule out PE.  Low suspicion for PE, suspect shortness of breath was due to Flu A positive. Not tachycardia. Saturating well on RA. He has been dealing with bilateral amputation pain/spasms. Pain medications PRN dosed according to hx of ESRD. Continue home medication Crestor 10 mg, Zetia 10 mg daily, and aspirin ED 81 mg daily. *** ***   Chronic Conditions Type II DM: continue home medication Semglee 15 units at bedtime HTN: continue home medication carvedilol 6.25 mg BID Tobacco Use: Nicotine patch

## 2023-02-03 NOTE — ED Provider Notes (Signed)
[place] EMERGENCY DEPARTMENT AT [facility] Provider Note   CSN: [id number] Arrival date & time: 02/03/23  1506     History  Chief Complaint  Patient presents with   Code Sepsis    [name] is a 63 y.o. male with medical history of type 2 diabetes, retroperitoneal hematoma, PAD, hypertension, heart failure reduced ejection fraction, GERD, ESRD on hemodialysis Monday Wednesday Friday, bilateral lower extremity potation's.  Patient presents to ED as code sepsis.  Patient son apparently called EMS due to shortness of breath.  The patient was in the ED yesterday but sent home due to unremarkable workup, here for weakness.  Patient unable to provide any information on exam, will not respond to questions.  Will occasionally open his eyes and be able to answer yes or no questions but largely unable to participate in exam.  On chart review patient last went to dialysis on 1/21.  He reports that he goes to dialysis Tuesday, Thursday and Saturday but on chart review it appears that he told provider yesterday he goes Monday Wednesday Friday.  HPI     Home Medications Prior to Admission medications   Medication Sig Start Date End Date Taking? Authorizing Provider  Accu-Chek Softclix Lancets lancets Use to check blood sugar 3 (three) times daily. 12/22/22   Lovie Macadamia, MD  aspirin EC 81 MG tablet Take 1 tablet (81 mg total) by mouth daily. Swallow whole. 12/22/22   Lovie Macadamia, MD  Blood Glucose Monitoring Suppl (ACCU-CHEK GUIDE) w/Device KIT Use to check your blood sugar three times daily. 12/23/22   Lovie Macadamia, MD  carvedilol (COREG) 6.25 MG tablet Take 1 tablet (6.25 mg total) by mouth 2 (two) times daily with meals for hypertension 12/22/22   Lovie Macadamia, MD  diclofenac Sodium (VOLTAREN) 1 % GEL Apply 4 g topically 4 (four) times daily. 06/29/22   Rocky Morel, DO  DULoxetine (CYMBALTA) 60 MG capsule Take 1 capsule (60 mg total) by mouth daily.  06/29/22   Rocky Morel, DO  ezetimibe (ZETIA) 10 MG tablet Take 1 tablet (10 mg total) by mouth daily. 12/22/22   Lovie Macadamia, MD  glucose blood (ACCU-CHEK AVIVA PLUS) test strip Check blood sugar 3 times a day 12/22/22   Lovie Macadamia, MD  insulin glargine (LANTUS) 100 UNIT/ML Solostar Pen Inject 15 Units into the skin at bedtime. Please ship to patient. Thank you 12/22/22   Lovie Macadamia, MD  insulin lispro (HUMALOG) 100 UNIT/ML KwikPen Inject 5 Units into the skin 3 (three) times daily with meals. 06/28/22   Katsadouros, Vasilios, MD  Insulin Pen Needle 32G X 4 MM MISC Use to inject insulin 4 (four) times daily. 12/22/22   Lovie Macadamia, MD  melatonin (KP MELATONIN) 3 MG TABS tablet Take 1 tablet (3 mg total) by mouth at bedtime for insomnia. 09/10/21     pregabalin (LYRICA) 25 MG capsule Take 1 capsule by mouth once a day 12/22/22   Lovie Macadamia, MD  rosuvastatin (CRESTOR) 10 MG tablet Take 1 tablet (10 mg total) by mouth daily. 12/22/22   Lovie Macadamia, MD      Allergies    Benicar [olmesartan]    Review of Systems   Review of Systems  Unable to perform ROS: Mental status change (Level 5 caveat)  All other systems reviewed and are negative.   Physical Exam Updated Vital Signs BP 133/61   Pulse 76   Temp 98.9 F (37.2 C) (Oral)   Resp (!) 26   SpO2  100%  Physical Exam Vitals and nursing note reviewed.  Constitutional:      General: He is not in acute distress.    Appearance: Normal appearance. He is not ill-appearing, toxic-appearing or diaphoretic.  HENT:     Head: Normocephalic and atraumatic.     Nose: Nose normal.     Mouth/Throat:     Mouth: Mucous membranes are moist.     Pharynx: Oropharynx is clear.  Eyes:     Extraocular Movements: Extraocular movements intact.     Conjunctiva/sclera: Conjunctivae normal.     Pupils: Pupils are equal, round, and reactive to light.  Cardiovascular:     Rate and Rhythm: Normal rate and regular  rhythm.  Pulmonary:     Effort: Pulmonary effort is normal.     Breath sounds: Normal breath sounds. No wheezing.  Abdominal:     General: Abdomen is flat. Bowel sounds are normal.     Palpations: Abdomen is soft.     Tenderness: There is no abdominal tenderness.  Musculoskeletal:     Cervical back: Normal range of motion and neck supple. No tenderness.  Skin:    General: Skin is warm and dry.     Capillary Refill: Capillary refill takes less than 2 seconds.  Neurological:     Mental Status: He is alert and oriented to person, place, and time.     ED Results / Procedures / Treatments   Labs (all labs ordered are listed, but only abnormal results are displayed) Labs Reviewed  RESP PANEL BY RT-PCR (RSV, FLU A&B, COVID)  RVPGX2 - Abnormal; Notable for the following components:      Result Value   Influenza A by PCR POSITIVE (*)    All other components within normal limits  CBC - Abnormal; Notable for the following components:   RBC 3.97 (*)    Hemoglobin 12.2 (*)    HCT 35.8 (*)    All other components within normal limits  COMPREHENSIVE METABOLIC PANEL - Abnormal; Notable for the following components:   Sodium 133 (*)    Chloride 97 (*)    CO2 19 (*)    Glucose, Bld 147 (*)    BUN 84 (*)    Creatinine, Ser 8.84 (*)    Calcium 8.0 (*)    GFR, Estimated 6 (*)    Anion gap 17 (*)    All other components within normal limits  D-DIMER, QUANTITATIVE - Abnormal; Notable for the following components:   D-Dimer, Quant 2.32 (*)    All other components within normal limits  I-STAT CHEM 8, ED - Abnormal; Notable for the following components:   Sodium 133 (*)    BUN 93 (*)    Creatinine, Ser 9.50 (*)    Glucose, Bld 145 (*)    Calcium, Ion 0.95 (*)    TCO2 21 (*)    Hemoglobin 12.6 (*)    HCT 37.0 (*)    All other components within normal limits  CULTURE, BLOOD (ROUTINE X 2)  CULTURE, BLOOD (ROUTINE X 2)  ETHANOL  AMMONIA  PROTIME-INR  APTT  URINALYSIS, ROUTINE W REFLEX  MICROSCOPIC  I-STAT CG4 LACTIC ACID, ED  I-STAT CG4 LACTIC ACID, ED    EKG EKG Interpretation Date/Time:  Thursday February 03 2023 16:10:37 EST Ventricular Rate:  89 PR Interval:  124 QRS Duration:  118 QT Interval:  394 QTC Calculation: 480 R Axis:   41  Text Interpretation: Sinus rhythm Supraventricular bigeminy Nonspecific intraventricular conduction delay Nonspecific repol  abnormality, diffuse leads No significant change since last tracing Confirmed by Richardean Canal ([id number]) on 02/03/2023 4:15:44 PM  Radiology DG Chest Portable 1 View Result Date: 02/03/2023 CLINICAL DATA:  Sepsis. EXAM: PORTABLE CHEST 1 VIEW COMPARISON:  Chest radiograph dated 02/02/2023. FINDINGS: Dialysis catheter in similar position. No focal consolidation, pleural effusion or pneumothorax. The cardiac silhouette is within normal limits. No acute osseous pathology. IMPRESSION: No active disease. Electronically Signed   By: Elgie Collard M.D.   On: 02/03/2023 17:16   CT ABDOMEN PELVIS WO CONTRAST Result Date: 02/03/2023 CLINICAL DATA:  "Sepsis".   tachypnea and cough. EXAM: CT ABDOMEN AND PELVIS WITHOUT CONTRAST TECHNIQUE: Multidetector CT imaging of the abdomen and pelvis was performed following the standard protocol without IV contrast. RADIATION DOSE REDUCTION: This exam was performed according to the departmental dose-optimization program which includes automated exposure control, adjustment of the mA and/or kV according to patient size and/or use of iterative reconstruction technique. COMPARISON:  06/27/2021 FINDINGS: Mild motion degradation throughout. Exam also degraded by patient arm position, lack of oral or IV contrast, overlying EKG wires and leads. Lower chest: Bilateral lower lobe interstitial accentuation. Mild cardiomegaly with trace left pleural fluid. Hepatobiliary: Grossly normal noncontrast appearance of the liver, gallbladder, biliary tract. Pancreas: Pancreatic atrophy. No duct dilatation or acute  inflammation. Spleen: Normal in size, without focal abnormality. Adrenals/Urinary Tract: Normal adrenal glands. No renal calculi or hydronephrosis. Mild thickening of the left anterior renal fascia inferiorly on 47/3 is at the site of a hematoma on 06/27/2021 . No hydroureter or ureteric calculi.  No bladder calculi. Stomach/Bowel: Normal stomach, without wall thickening. Fluid-filled colon. Normal terminal ileum and appendix. Normal small bowel. Vascular/Lymphatic: Aortic atherosclerosis. No abdominopelvic adenopathy. Reproductive: Moderate prostatomegaly. Other: No significant free fluid.  No free intraperitoneal air. Musculoskeletal: No acute osseous abnormality. IMPRESSION: 1. Multifactorial degradation, as detailed above. 2. Bilateral lower lobe interstitial thickening is nonspecific, likely related to mild infection/inflammation of indeterminate acuity. Small left pleural effusion. 3.  fluid-filled colon suggests a diarrheal state 4. Small left pleural effusion 5. Soft tissue thickening within the inferior left renal fascia is at the site of a larger hematoma on 06/27/2021, likely secondary. Recurrent small volume retroperitoneal hematoma cannot technically be excluded. 6. Prostatomegaly 7.  Aortic Atherosclerosis (ICD10-I70.0). Electronically Signed   By: Jeronimo Greaves M.D.   On: 02/03/2023 16:54   CT Head Wo Contrast Result Date: 02/03/2023 CLINICAL DATA:  Altered mental status. EXAM: CT HEAD WITHOUT CONTRAST TECHNIQUE: Contiguous axial images were obtained from the base of the skull through the vertex without intravenous contrast. RADIATION DOSE REDUCTION: This exam was performed according to the departmental dose-optimization program which includes automated exposure control, adjustment of the mA and/or kV according to patient size and/or use of iterative reconstruction technique. COMPARISON:  Head CT dated 08/24/2022. FINDINGS: Brain: Mild age-related atrophy and chronic microvascular ischemic changes.  There is no acute intracranial hemorrhage. No mass effect or midline shift. No extra-axial fluid collection. Vascular: No hyperdense vessel or unexpected calcification. Skull: Normal. Negative for fracture or focal lesion. Sinuses/Orbits: No acute finding. Other: None IMPRESSION: 1. No acute intracranial pathology. 2. Mild age-related atrophy and chronic microvascular ischemic changes. Electronically Signed   By: Elgie Collard M.D.   On: 02/03/2023 16:49   DG Chest Port 1 View Result Date: 02/02/2023 CLINICAL DATA:  Weakness. EXAM: PORTABLE CHEST 1 VIEW COMPARISON:  Chest radiograph dated 08/24/2022. FINDINGS: Right IJ dialysis catheter in similar position. No focal consolidation, pleural effusion, or pneumothorax. The  cardiac silhouette is within normal limits. No acute osseous pathology. IMPRESSION: No active disease. Electronically Signed   By: Elgie Collard M.D.   On: 02/02/2023 11:10    Procedures Procedures    Medications Ordered in ED Medications  acetaminophen (TYLENOL) suppository 650 mg (650 mg Rectal Given 02/03/23 1531)  sodium chloride 0.9 % bolus 500 mL (0 mLs Intravenous Stopped 02/03/23 2123)  metroNIDAZOLE (FLAGYL) IVPB 500 mg (0 mg Intravenous Stopped 02/03/23 1936)  vancomycin (VANCOREADY) IVPB 1500 mg/300 mL (0 mg Intravenous Stopped 02/03/23 2152)  ceFEPIme (MAXIPIME) 2 g in sodium chloride 0.9 % 100 mL IVPB (0 g Intravenous Stopped 02/03/23 1800)    ED Course/ Medical Decision Making/ A&P    Medical Decision Making Amount and/or Complexity of Data Reviewed Labs: ordered. Radiology: ordered.  Risk OTC drugs. Prescription drug management.   63 year old male presents for evaluation.  Please see HPI for further details.  On exam patient unable to participate in examination.  He is febrile, slightly tachycardic.  Lung sounds are clear bilaterally, not hypoxic room air.  Abdomen soft and compressible.  Will initiate sepsis workup.  Patient was provided vancomycin,  cefepime and metronidazole.  Will provide him 500 mL of fluid for tachycardia.  Will collect CT scan of head, CT abdomen pelvis, viral panel, CMP, CBC, D-dimer, chest x-ray, lactic acids, PT/INR, ammonia.  Will also collect ethanol.  CBC without leukocytosis, baseline hemoglobin.  Metabolic panel with sodium 133, glucose 147, creatinine 8.  8 4, anion gap 17, GFR 6.  PT/INR WNL.  D-dimer elevated to 2.32, will require V/Q perfusion scan.  Lactic acid not elevated at 1.2.  aPTT WNL.  Ammonia WNL.  Ethanol unremarkable.  CT head unremarkable.  Chest x-ray unremarkable.  CT abdomen pelvis unremarkable for acute process.  Patient will require admission for encephalopathy, possibly uremia.  Patient will require dialysis.  Reached out to Dr. Marisue Humble of nephrology who has agreed to dialyze patient while admitted.  Spoke with internal medicine teaching service who agreed to admit patient.  Patient stable at admission.  Final Clinical Impression(s) / ED Diagnoses Final diagnoses:  Encephalopathy, unspecified type  Uremia    Rx / DC Orders ED Discharge Orders     None         Clent Ridges 02/03/23 2315    Charlynne Pander, MD 02/04/23 2328

## 2023-02-04 ENCOUNTER — Observation Stay (HOSPITAL_COMMUNITY): Payer: 59

## 2023-02-04 ENCOUNTER — Encounter: Payer: 59 | Admitting: Student

## 2023-02-04 DIAGNOSIS — N19 Unspecified kidney failure: Secondary | ICD-10-CM | POA: Insufficient documentation

## 2023-02-04 DIAGNOSIS — J101 Influenza due to other identified influenza virus with other respiratory manifestations: Secondary | ICD-10-CM | POA: Insufficient documentation

## 2023-02-04 DIAGNOSIS — J09X2 Influenza due to identified novel influenza A virus with other respiratory manifestations: Secondary | ICD-10-CM | POA: Insufficient documentation

## 2023-02-04 DIAGNOSIS — R404 Transient alteration of awareness: Secondary | ICD-10-CM

## 2023-02-04 LAB — BASIC METABOLIC PANEL
Anion gap: 20 — ABNORMAL HIGH (ref 5–15)
BUN: 94 mg/dL — ABNORMAL HIGH (ref 8–23)
CO2: 15 mmol/L — ABNORMAL LOW (ref 22–32)
Calcium: 7.4 mg/dL — ABNORMAL LOW (ref 8.9–10.3)
Chloride: 100 mmol/L (ref 98–111)
Creatinine, Ser: 9.49 mg/dL — ABNORMAL HIGH (ref 0.61–1.24)
GFR, Estimated: 6 mL/min — ABNORMAL LOW (ref >60)
Glucose, Bld: 104 mg/dL — ABNORMAL HIGH (ref 70–99)
Potassium: 4.3 mmol/L (ref 3.5–5.1)
Sodium: 135 mmol/L (ref 135–145)

## 2023-02-04 LAB — BLOOD GAS, VENOUS
Acid-base deficit: 9.8 mmol/L — ABNORMAL HIGH (ref 0.0–2.0)
Bicarbonate: 16 mmol/L — ABNORMAL LOW (ref 20.0–28.0)
O2 Saturation: 26.4 %
Patient temperature: 37
pCO2, Ven: 34 mm[Hg] — ABNORMAL LOW (ref 44–60)
pH, Ven: 7.28 (ref 7.25–7.43)
pO2, Ven: <31 mm[Hg] — CL (ref 32–45)

## 2023-02-04 LAB — CBC
HCT: 34.8 % — ABNORMAL LOW (ref 39.0–52.0)
Hemoglobin: 11.6 g/dL — ABNORMAL LOW (ref 13.0–17.0)
MCH: 30.4 pg (ref 26.0–34.0)
MCHC: 33.3 g/dL (ref 30.0–36.0)
MCV: 91.3 fL (ref 80.0–100.0)
Platelets: 134 10*3/uL — ABNORMAL LOW (ref 150–400)
RBC: 3.81 MIL/uL — ABNORMAL LOW (ref 4.22–5.81)
RDW: 13.1 % (ref 11.5–15.5)
WBC: 8.6 10*3/uL (ref 4.0–10.5)
nRBC: 0 % (ref 0.0–0.2)

## 2023-02-04 LAB — CBG MONITORING, ED
Glucose-Capillary: 120 mg/dL — ABNORMAL HIGH (ref 70–99)
Glucose-Capillary: 112 mg/dL — ABNORMAL HIGH (ref 70–99)

## 2023-02-04 LAB — HEPATITIS B SURFACE ANTIGEN: Hepatitis B Surface Ag: NONREACTIVE

## 2023-02-04 LAB — GLUCOSE, CAPILLARY: Glucose-Capillary: 166 mg/dL — ABNORMAL HIGH (ref 70–99)

## 2023-02-04 MED ORDER — ROSUVASTATIN CALCIUM 5 MG PO TABS
10.0000 mg | ORAL_TABLET | Freq: Every day | ORAL | Status: DC
  Administered 2023-02-04 – 2023-02-11 (×8): 10 mg via ORAL
  Filled 2023-02-04 (×8): qty 2

## 2023-02-04 MED ORDER — MIDODRINE HCL 5 MG PO TABS: 10.0000 mg | ORAL_TABLET | Freq: Once | ORAL | Status: DC

## 2023-02-04 MED ORDER — CARVEDILOL 6.25 MG PO TABS
6.2500 mg | ORAL_TABLET | Freq: Two times a day (BID) | ORAL | Status: DC
  Administered 2023-02-04 – 2023-02-11 (×11): 6.25 mg via ORAL
  Filled 2023-02-04 (×10): qty 1
  Filled 2023-02-04: qty 2

## 2023-02-04 MED ORDER — TECHNETIUM TO 99M ALBUMIN AGGREGATED
4.1000 | Freq: Once | INTRAVENOUS | Status: AC | PRN
  Administered 2023-02-04: 4.1 via INTRAVENOUS

## 2023-02-04 MED ORDER — INSULIN GLARGINE-YFGN 100 UNIT/ML ~~LOC~~ SOLN
15.0000 [IU] | Freq: Every day | SUBCUTANEOUS | Status: DC
  Administered 2023-02-04 – 2023-02-10 (×8): 15 [IU] via SUBCUTANEOUS
  Filled 2023-02-04 (×10): qty 0.15

## 2023-02-04 MED ORDER — LOPERAMIDE HCL 2 MG PO CAPS: 2.0000 mg | ORAL_CAPSULE | ORAL | Status: DC | PRN

## 2023-02-04 MED ORDER — ACETAMINOPHEN 325 MG PO TABS
650.0000 mg | ORAL_TABLET | Freq: Four times a day (QID) | ORAL | Status: DC | PRN
  Administered 2023-02-04: 650 mg via ORAL
  Filled 2023-02-04: qty 2

## 2023-02-04 MED ORDER — ASPIRIN 81 MG PO TBEC
81.0000 mg | DELAYED_RELEASE_TABLET | Freq: Every day | ORAL | Status: DC
  Administered 2023-02-04 – 2023-02-11 (×8): 81 mg via ORAL
  Filled 2023-02-04 (×8): qty 1

## 2023-02-04 MED ORDER — HEPARIN SODIUM (PORCINE) 1000 UNIT/ML DIALYSIS
1000.0000 [IU] | INTRAMUSCULAR | Status: DC | PRN
  Administered 2023-02-04: 3200 [IU]

## 2023-02-04 MED ORDER — PROSOURCE PLUS PO LIQD
30.0000 mL | Freq: Two times a day (BID) | ORAL | Status: DC
  Administered 2023-02-05 – 2023-02-11 (×11): 30 mL via ORAL
  Filled 2023-02-04 (×11): qty 30

## 2023-02-04 MED ORDER — CHLORHEXIDINE GLUCONATE CLOTH 2 % EX PADS
6.0000 | MEDICATED_PAD | Freq: Every day | CUTANEOUS | Status: DC
  Administered 2023-02-05 – 2023-02-07 (×3): 6 via TOPICAL

## 2023-02-04 MED ORDER — BENZONATATE 100 MG PO CAPS
100.0000 mg | ORAL_CAPSULE | Freq: Three times a day (TID) | ORAL | Status: DC | PRN
  Administered 2023-02-09: 100 mg via ORAL
  Filled 2023-02-04: qty 1

## 2023-02-04 MED ORDER — HEPARIN SODIUM (PORCINE) 1000 UNIT/ML IJ SOLN
INTRAMUSCULAR | Status: AC
  Administered 2023-02-04: 4000 [IU]
  Filled 2023-02-04: qty 4

## 2023-02-04 MED ORDER — DOXERCALCIFEROL 4 MCG/2ML IV SOLN
5.0000 ug | INTRAVENOUS | Status: DC
  Administered 2023-02-08 – 2023-02-10 (×2): 5 ug via INTRAVENOUS
  Filled 2023-02-04 (×5): qty 4

## 2023-02-04 MED ORDER — EZETIMIBE 10 MG PO TABS
10.0000 mg | ORAL_TABLET | Freq: Every day | ORAL | Status: DC
  Administered 2023-02-04 – 2023-02-11 (×8): 10 mg via ORAL
  Filled 2023-02-04 (×8): qty 1

## 2023-02-04 MED ORDER — NICOTINE 7 MG/24HR TD PT24
7.0000 mg | MEDICATED_PATCH | Freq: Every day | TRANSDERMAL | Status: DC
  Administered 2023-02-04 – 2023-02-11 (×8): 7 mg via TRANSDERMAL
  Filled 2023-02-04 (×9): qty 1

## 2023-02-04 MED ORDER — HYDROMORPHONE HCL 2 MG PO TABS
2.0000 mg | ORAL_TABLET | Freq: Four times a day (QID) | ORAL | Status: DC | PRN
  Administered 2023-02-04 – 2023-02-07 (×6): 2 mg via ORAL
  Filled 2023-02-04 (×6): qty 1

## 2023-02-04 NOTE — ED Notes (Signed)
Moved patient to yellow zone, he advised he doesn't have his cell phone, I came back to room #9 went through the linen from his bed change, and called his phone twice.
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
## 2023-02-04 NOTE — Progress Notes (Signed)
Received patient in bed to unit.  Alert and oriented.  Informed consent signed and in chart.   TX duration:4 hours  Patient tolerated well, tx ended 5 minutes early venous pressure low  Transported back to the room  Alert, without acute distress.  Hand-off given to patient's nurse.   Access used: dialysis cath Access issues: venous pressure low  Total UF removed: 1300 Medication(s) given: heplock 1.6 units per port,  Post HD VS: see table below Post HD weight: 59.3kg bed scale   02/04/23 1930  Vitals  Temp (!) 96.8 F (36 C)  Temp Source Axillary  BP (!) 138/97  MAP (mmHg) 108  BP Location Left Arm  BP Method Automatic  Patient Position (if appropriate) Lying  Pulse Rate 99  Pulse Rate Source Monitor  ECG Heart Rate 99  Resp (!) 27  Oxygen Therapy  SpO2 100 %  O2 Device Room Air  Patient Activity (if Appropriate) In bed  Pulse Oximetry Type Continuous  During Treatment Monitoring  Blood Flow Rate (mL/min) 199 mL/min  Arterial Pressure (mmHg) 13.53 mmHg  Venous Pressure (mmHg) 108.88 mmHg  TMP (mmHg) 85.04 mmHg  Ultrafiltration Rate (mL/min) 1029 mL/min  Dialysate Flow Rate (mL/min) 300 ml/min  Dialysate Potassium Concentration 3  Dialysate Calcium Concentration 2.5  Duration of HD Treatment -hour(s) 3.89 hour(s)  Cumulative Fluid Removed (mL) per Treatment  1287.96  HD Safety Checks Performed Yes  Post Treatment  Dialyzer Clearance Lightly streaked  Hemodialysis Intake (mL) 0 mL  Liters Processed 72.5  Fluid Removed (mL) 1300 mL  Tolerated HD Treatment Yes  Post-Hemodialysis Comments  (tx ended early 5 minutes venous pressure low alarming unable to continue tx)  Hemodialysis Catheter Right Subclavian  No placement date or time found.   Orientation: Right  Access Location: Subclavian  Site Condition No complications  Blue Lumen Status Flushed;Heparin locked;Dead end cap in place  Red Lumen Status Flushed;Heparin locked;Dead end cap in place  Purple Lumen  Status N/A  Catheter fill solution Heparin 1000 units/ml  Catheter fill volume (Arterial) 1.6 cc  Catheter fill volume (Venous) 1.6  Post treatment catheter status Capped and Clamped      [facility]

## 2023-02-04 NOTE — Progress Notes (Addendum)
HD#0 Subjective:  Overnight Events: No acute events overnight.  He was examined at the bedside. He reports sharp bilateral pain in lower extremities. He denies cough or SOB or diarrhea.  Objective:  Vital signs in last 24 hours: Vitals:   02/04/23 0834 02/04/23 1030 02/04/23 1115 02/04/23 1242  BP: 122/65 125/62 125/67   Pulse: 76 81 80   Resp: (!) 22 (!) 27 16   Temp: 98.4 F (36.9 C)   98.3 F (36.8 C)  TempSrc:    Oral  SpO2: 100% 97% 98%    Supplemental O2: Room Air SpO2: 98 %   Physical Exam:   General: NAD CV: RRR. No m/r/g. No LE edema Pulmonary: Lungs CTAB. Normal effort. No wheezing or rales. Abdominal: Soft, nontender, nondistended. Normal bowel sounds. MSK: Below knee amputation of both right and left leg.  Skin: Warm and dry. No obvious rash or lesions.  There were no vitals filed for this visit.   Intake/Output Summary (Last 24 hours) at 02/04/2023 1247 Last data filed at 02/03/2023 2152 Gross per 24 hour  Intake 884.86 ml  Output --  Net 884.86 ml   Net IO Since Admission: 884.86 mL [02/04/23 1247]  Recent Labs    02/04/23 0144 02/04/23 0518  GLUCAP 120* 112*     Pertinent Labs:    Latest Ref Rng & Units 02/04/2023    5:08 AM 02/03/2023    5:04 PM 02/03/2023    4:43 PM  CBC  WBC 4.0 - 10.5 K/uL 8.6   9.7   Hemoglobin 13.0 - 17.0 g/dL 16.1  09.6  04.5   Hematocrit 39.0 - 52.0 % 34.8  37.0  35.8   Platelets 150 - 400 K/uL 134   153        Latest Ref Rng & Units 02/04/2023    5:08 AM 02/03/2023    5:04 PM 02/03/2023    4:43 PM  CMP  Glucose 70 - 99 mg/dL 409  811  914   BUN 8 - 23 mg/dL 94  93  84   Creatinine 0.61 - 1.24 mg/dL 7.82  9.56  2.13   Sodium 135 - 145 mmol/L 135  133  133   Potassium 3.5 - 5.1 mmol/L 4.3  4.7  4.6   Chloride 98 - 111 mmol/L 100  98  97   CO2 22 - 32 mmol/L 15   19   Calcium 8.9 - 10.3 mg/dL 7.4   8.0   Total Protein 6.5 - 8.1 g/dL   7.4   Total Bilirubin 0.0 - 1.2 mg/dL   0.7   Alkaline Phos 38 - 126  U/L   57   AST 15 - 41 U/L   33   ALT 0 - 44 U/L   22     Imaging: DG Chest Portable 1 View Result Date: 02/03/2023 CLINICAL DATA:  Sepsis. EXAM: PORTABLE CHEST 1 VIEW COMPARISON:  Chest radiograph dated 02/02/2023. FINDINGS: Dialysis catheter in similar position. No focal consolidation, pleural effusion or pneumothorax. The cardiac silhouette is within normal limits. No acute osseous pathology. IMPRESSION: No active disease. Electronically Signed   By: Elgie Collard M.D.   On: 02/03/2023 17:16   CT ABDOMEN PELVIS WO CONTRAST Result Date: 02/03/2023 CLINICAL DATA:  "Sepsis".   tachypnea and cough. EXAM: CT ABDOMEN AND PELVIS WITHOUT CONTRAST TECHNIQUE: Multidetector CT imaging of the abdomen and pelvis was performed following the standard protocol without IV contrast. RADIATION DOSE REDUCTION: This exam was performed  according to the departmental dose-optimization program which includes automated exposure control, adjustment of the mA and/or kV according to patient size and/or use of iterative reconstruction technique. COMPARISON:  06/27/2021 FINDINGS: Mild motion degradation throughout. Exam also degraded by patient arm position, lack of oral or IV contrast, overlying EKG wires and leads. Lower chest: Bilateral lower lobe interstitial accentuation. Mild cardiomegaly with trace left pleural fluid. Hepatobiliary: Grossly normal noncontrast appearance of the liver, gallbladder, biliary tract. Pancreas: Pancreatic atrophy. No duct dilatation or acute inflammation. Spleen: Normal in size, without focal abnormality. Adrenals/Urinary Tract: Normal adrenal glands. No renal calculi or hydronephrosis. Mild thickening of the left anterior renal fascia inferiorly on 47/3 is at the site of a hematoma on 06/27/2021 . No hydroureter or ureteric calculi.  No bladder calculi. Stomach/Bowel: Normal stomach, without wall thickening. Fluid-filled colon. Normal terminal ileum and appendix. Normal small bowel.  Vascular/Lymphatic: Aortic atherosclerosis. No abdominopelvic adenopathy. Reproductive: Moderate prostatomegaly. Other: No significant free fluid.  No free intraperitoneal air. Musculoskeletal: No acute osseous abnormality. IMPRESSION: 1. Multifactorial degradation, as detailed above. 2. Bilateral lower lobe interstitial thickening is nonspecific, likely related to mild infection/inflammation of indeterminate acuity. Small left pleural effusion. 3.  fluid-filled colon suggests a diarrheal state 4. Small left pleural effusion 5. Soft tissue thickening within the inferior left renal fascia is at the site of a larger hematoma on 06/27/2021, likely secondary. Recurrent small volume retroperitoneal hematoma cannot technically be excluded. 6. Prostatomegaly 7.  Aortic Atherosclerosis (ICD10-I70.0). Electronically Signed   By: Jeronimo Greaves M.D.   On: 02/03/2023 16:54   CT Head Wo Contrast Result Date: 02/03/2023 CLINICAL DATA:  Altered mental status. EXAM: CT HEAD WITHOUT CONTRAST TECHNIQUE: Contiguous axial images were obtained from the base of the skull through the vertex without intravenous contrast. RADIATION DOSE REDUCTION: This exam was performed according to the departmental dose-optimization program which includes automated exposure control, adjustment of the mA and/or kV according to patient size and/or use of iterative reconstruction technique. COMPARISON:  Head CT dated 08/24/2022. FINDINGS: Brain: Mild age-related atrophy and chronic microvascular ischemic changes. There is no acute intracranial hemorrhage. No mass effect or midline shift. No extra-axial fluid collection. Vascular: No hyperdense vessel or unexpected calcification. Skull: Normal. Negative for fracture or focal lesion. Sinuses/Orbits: No acute finding. Other: None IMPRESSION: 1. No acute intracranial pathology. 2. Mild age-related atrophy and chronic microvascular ischemic changes. Electronically Signed   By: Elgie Collard M.D.   On:  02/03/2023 16:49    Assessment/Plan:   Principal Problem:   Altered mental status Active Problems:   Hypertension, essential   Sepsis, viral (HCC)   S/P BKA (below knee amputation) unilateral, right (HCC)   ESRD on dialysis (HCC)   Type 2 diabetes mellitus with diabetic peripheral angiopathy without gangrene, with long-term current use of insulin (HCC)   S/P AKA (above knee amputation) unilateral, left (HCC)   Uremia   Influenza A   Patient Summary: [name] is a 63 y.o. male with a history of ESRD on HD TTS, PAD s/p bilateral AKA, T2DM who presented with confusion and admitted for altered mental status on hospital day    AMS Suspected Uremia NAGMA + AGMA His mentation has improved.  He is afebrile, no leukocytosis and satting normal on room air.  AMS etiology unclear, likely in the setting of uremia from missed dialysis.  No electrolyte changes or acute worsening infection to explain AMS, thankfully, that has resolved. Normal chest x-ray, CT abdomen pelvis showed a small left pleural effusion,  volume overload 2/2 to two missed dialysis sessions.  Nephrology will dialyze him today.  Plan: - Discontinue Abx  - HD Dialysis    # Sepsis 2/2 Flu A Positive for the flu, afebrile, satting on room air. No respiratory failure, holding off on tamiflu for now.   -Supportive treatment.  ESRD HD TTS States that he goes on T/Th/Sat schedule.  Per chart review, last time he was dialyzed was on 01/25.  Nephrology consulted, will dialyze patient today.  - HD Dialysis today    PAD s/p bilateral AKA Elevated D-dimer Elevated D-dimer of 2.32, V/Q perfusion scan ordered by EDP to rule out PE.  Low suspicion for PE, suspect shortness of breath was due to perhaps acute H. influenzae infection.  We have discontinued VQ scan ordered.  He is not tachycardic, satting normal on room air. This morning, he is reporting sharp bilateral pain at the site of his amputation, will treat pain with  Dilaudid, we are limited in the medicines for pain control, due to patient's ESRD.  - PO Dilaudid 2 mg for pain. - Continue home medication Crestor 10 mg, Zetia 10 mg daily, and aspirin ED 81 mg daily     Chronic Conditions Type II DM: continue home medication Semglee 15 units at bedtime HTN: continue home medication carvedilol 6.25 mg BID Tobacco Use: Nicotine patch   Diet: Renal diet IVF: PO intake VTE: heparin injection 5,000 Units Start: 02/04/23 0800 Code: Full PT/OT: None ID:  Anti-infectives (From admission, onward)    Start     Dose/Rate Route Frequency Ordered Stop   02/03/23 1545  vancomycin (VANCOREADY) IVPB 1500 mg/300 mL        1,500 mg 150 mL/hr over 120 Minutes Intravenous  Once 02/03/23 1536 02/03/23 2152   02/03/23 1545  ceFEPIme (MAXIPIME) 1 g in sodium chloride 0.9 % 100 mL IVPB  Status:  Discontinued        1 g 200 mL/hr over 30 Minutes Intravenous  Once 02/03/23 1536 02/03/23 1544   02/03/23 1545  ceFEPIme (MAXIPIME) 2 g in sodium chloride 0.9 % 100 mL IVPB        2 g 200 mL/hr over 30 Minutes Intravenous  Once 02/03/23 1544 02/03/23 1800   02/03/23 1530  metroNIDAZOLE (FLAGYL) IVPB 500 mg        500 mg 100 mL/hr over 60 Minutes Intravenous  Once 02/03/23 1528 02/03/23 1936        Anticipated discharge pending medical stabilization.  Laretta Bolster, MD 02/04/2023, 12:48 PM Pager: [phone] [facility]  Please contact the on call pager after 5 pm and on weekends at [phone].

## 2023-02-04 NOTE — Progress Notes (Signed)
Unable to aspirate venous lumen, provider aware, NNO at this time

## 2023-02-05 DIAGNOSIS — N250 Renal osteodystrophy: Secondary | ICD-10-CM | POA: Diagnosis not present

## 2023-02-05 DIAGNOSIS — N186 End stage renal disease: Secondary | ICD-10-CM

## 2023-02-05 DIAGNOSIS — J09X2 Influenza due to identified novel influenza A virus with other respiratory manifestations: Secondary | ICD-10-CM

## 2023-02-05 DIAGNOSIS — R0602 Shortness of breath: Secondary | ICD-10-CM | POA: Diagnosis not present

## 2023-02-05 DIAGNOSIS — D631 Anemia in chronic kidney disease: Secondary | ICD-10-CM | POA: Diagnosis not present

## 2023-02-05 DIAGNOSIS — I120 Hypertensive chronic kidney disease with stage 5 chronic kidney disease or end stage renal disease: Secondary | ICD-10-CM | POA: Diagnosis not present

## 2023-02-05 DIAGNOSIS — Z992 Dependence on renal dialysis: Secondary | ICD-10-CM | POA: Diagnosis not present

## 2023-02-05 DIAGNOSIS — G9340 Encephalopathy, unspecified: Secondary | ICD-10-CM | POA: Diagnosis not present

## 2023-02-05 LAB — RENAL FUNCTION PANEL
Albumin: 3.2 g/dL — ABNORMAL LOW (ref 3.5–5.0)
Anion gap: 23 — ABNORMAL HIGH (ref 5–15)
BUN: 44 mg/dL — ABNORMAL HIGH (ref 8–23)
CO2: 18 mmol/L — ABNORMAL LOW (ref 22–32)
Calcium: 8 mg/dL — ABNORMAL LOW (ref 8.9–10.3)
Chloride: 95 mmol/L — ABNORMAL LOW (ref 98–111)
Creatinine, Ser: 5.92 mg/dL — ABNORMAL HIGH (ref 0.61–1.24)
GFR, Estimated: 10 mL/min — ABNORMAL LOW (ref >60)
Glucose, Bld: 114 mg/dL — ABNORMAL HIGH (ref 70–99)
Phosphorus: 5.8 mg/dL — ABNORMAL HIGH (ref 2.5–4.6)
Potassium: 4 mmol/L (ref 3.5–5.1)
Sodium: 136 mmol/L (ref 135–145)

## 2023-02-05 LAB — GLUCOSE, CAPILLARY
Glucose-Capillary: 134 mg/dL — ABNORMAL HIGH (ref 70–99)
Glucose-Capillary: 164 mg/dL — ABNORMAL HIGH (ref 70–99)

## 2023-02-05 LAB — HEPATITIS B SURFACE ANTIBODY, QUANTITATIVE: Hep B S AB Quant (Post): 2500 m[IU]/mL

## 2023-02-05 MED ORDER — ALBUMIN HUMAN 25 % IV SOLN: 25.0000 g | Freq: Every day | INTRAVENOUS | Status: DC | PRN

## 2023-02-05 MED ORDER — LIDOCAINE 5 % EX PTCH
1.0000 | MEDICATED_PATCH | CUTANEOUS | Status: DC
  Administered 2023-02-05 – 2023-02-11 (×7): 1 via TRANSDERMAL
  Filled 2023-02-05 (×7): qty 1

## 2023-02-05 MED ORDER — MIDODRINE HCL 5 MG PO TABS
10.0000 mg | ORAL_TABLET | Freq: Once | ORAL | Status: AC
  Administered 2023-02-05: 10 mg via ORAL
  Filled 2023-02-05: qty 2

## 2023-02-05 MED ORDER — ALTEPLASE 2 MG IJ SOLR
2.0000 mg | Freq: Once | INTRAMUSCULAR | Status: AC | PRN
  Administered 2023-02-05: 2 mg
  Filled 2023-02-05: qty 2

## 2023-02-05 NOTE — Progress Notes (Signed)
Nephrology Follow-Up Consult note   Assessment/Recommendations: [name] is a/an 63 y.o. male with a past medical history significant for ESRD, admitted for AMS.      Dialysis Orders: Center: [facility] T,Th,S 4 hrs 180NRe 500/800 66 kg 2.0 K /2.0 Ca TDC - No Heparin  - Hectorol 5 mcg IV three times per week - Mircera 30 mcg IV q 4 weeks (last dose 01/25/2023)   Assessment/Plan:  AMS-likely uremia but has missed more dialysis in the past and this has not happened.  Could be multifactorial with also progressive underlying dementia.  Acute illness with influenza likely contributing as well  Influenza A-per primary  ESRD -received [facility] on Friday and will receive dialysis as well today.  Then maintain TTS schedule  Hypertension/volume  -appears euvolemic.  Maintain volume status with dialysis  Anemia  - hemoglobin at goal.  ESA if needed  Metabolic bone disease -corrected calcium near normal so we will continue to monitor.  Phosphorus near goal.  Continue to monitor with dialysis  Nutrition - Albumin low side. Protein supplements.    Recommendations conveyed to primary service.    Darnell Level [facility] 02/05/2023 9:28 AM  ___________________________________________________________  CC: AMS  Interval History/Subjective: Patient remains confused but more awake and alert today.  Denies any specific complaints but has a hard time answering questions consistently   Medications:  Current Facility-Administered Medications  Medication Dose Route Frequency Provider Last Rate Last Admin   (feeding supplement) PROSource Plus liquid 30 mL  30 mL Oral BID BM Pola Corn, NP   30 mL at 02/05/23 0858   acetaminophen (TYLENOL) tablet 650 mg  650 mg Oral Q6H PRN Rana Snare, DO   650 mg at 02/04/23 1858   aspirin EC tablet 81 mg  81 mg Oral Daily Nooruddin, Jason Fila, MD   81 mg at 02/04/23 1610   benzonatate (TESSALON) capsule 100 mg  100 mg Oral TID PRN Rana Snare, DO       carvedilol (COREG) tablet 6.25 mg  6.25 mg Oral BID WC Nooruddin, Saad, MD   6.25 mg at 02/05/23 0857   Chlorhexidine Gluconate Cloth 2 % PADS 6 each  6 each Topical Q0600 Pola Corn, NP   6 each at 02/05/23 9604   doxercalciferol (HECTOROL) injection 5 mcg  5 mcg Intravenous Q T,Th,Sa-HD Pola Corn, NP       ezetimibe (ZETIA) tablet 10 mg  10 mg Oral Daily Nooruddin, Saad, MD   10 mg at 02/05/23 0857   heparin injection 5,000 Units  5,000 Units Subcutaneous Q8H Nooruddin, Jason Fila, MD   5,000 Units at 02/05/23 0526   HYDROmorphone (DILAUDID) tablet 2 mg  2 mg Oral Q6H PRN Reymundo Poll, MD   2 mg at 02/05/23 0525   insulin glargine-yfgn (SEMGLEE) injection 15 Units  15 Units Subcutaneous QHS Nooruddin, Jason Fila, MD   15 Units at 02/04/23 2241   loperamide (IMODIUM) capsule 2 mg  2 mg Oral PRN Rana Snare, DO       nicotine (NICODERM CQ - dosed in mg/24 hr) patch 7 mg  7 mg Transdermal Daily Nooruddin, Saad, MD   7 mg at 02/05/23 0857   rosuvastatin (CRESTOR) tablet 10 mg  10 mg Oral Daily Nooruddin, Saad, MD   10 mg at 02/05/23 0857      Review of Systems: 10 systems reviewed and negative except per interval history/subjective  Physical Exam: Vitals:   02/05/23 0600 02/05/23 0907  BP: 122/66  104/64  Pulse: 75   Resp:    Temp:  98.2 F (36.8 C)  SpO2:  98%   No intake/output data recorded.  Intake/Output Summary (Last 24 hours) at 02/05/2023 8413 Last data filed at 02/04/2023 1930 Gross per 24 hour  Intake 200 ml  Output 1300 ml  Net -1100 ml   Constitutional: Chronically ill-appearing, lying in bed, no distress ENMT: ears and nose without scars or lesions, MMM CV: normal rate, no edema Respiratory: Bilateral chest rise, normal work of breathing Gastrointestinal: soft, non-tender, no palpable masses or hernias Skin: no visible lesions or rashes Psych: Awake and alert, appropriate mood and affect   Test Results I personally reviewed new  and old clinical labs and radiology tests Lab Results  Component Value Date   NA 136 02/05/2023   K 4.0 02/05/2023   CL 95 (L) 02/05/2023   CO2 18 (L) 02/05/2023   BUN 44 (H) 02/05/2023   CREATININE 5.92 (H) 02/05/2023   CALCIUM 8.0 (L) 02/05/2023   ALBUMIN 3.2 (L) 02/05/2023   PHOS 5.8 (H) 02/05/2023    CBC Recent Labs  Lab 02/02/23 1210 02/02/23 1238 02/03/23 1643 02/03/23 1704 02/04/23 0508  WBC 6.5  --  9.7  --  8.6  HGB 12.2*   < > 12.2* 12.6* 11.6*  HCT 37.7*   < > 35.8* 37.0* 34.8*  MCV 93.8  --  90.2  --  91.3  PLT 137*  --  153  --  134*   < > = values in this interval not displayed.

## 2023-02-05 NOTE — Progress Notes (Signed)
PHARMACY - PHYSICIAN COMMUNICATION CRITICAL VALUE ALERT - BLOOD CULTURE IDENTIFICATION (BCID)  [name] is an 63 y.o. male who presented to [facility] on 02/03/2023 with a chief complaint of dyspnea and encephalopathy   Assessment:  GRAM POSITIVE RODS 1 of 3, likely contaminant   Name of physician (or Provider) Contacted: Maximino Greenland  Current antibiotics: none, liely contaminant  Changes to prescribed antibiotics recommended:  Patient is on recommended antibiotics - No changes needed  Results for orders placed or performed during the hospital encounter of 08/24/22  Blood Culture ID Panel (Reflexed) (Collected: 08/24/2022  3:54 PM)  Result Value Ref Range   Enterococcus faecalis NOT DETECTED NOT DETECTED   Enterococcus Faecium NOT DETECTED NOT DETECTED   Listeria monocytogenes NOT DETECTED NOT DETECTED   Staphylococcus species NOT DETECTED NOT DETECTED   Staphylococcus aureus (BCID) NOT DETECTED NOT DETECTED   Staphylococcus epidermidis NOT DETECTED NOT DETECTED   Staphylococcus lugdunensis NOT DETECTED NOT DETECTED   Streptococcus species NOT DETECTED NOT DETECTED   Streptococcus agalactiae NOT DETECTED NOT DETECTED   Streptococcus pneumoniae NOT DETECTED NOT DETECTED   Streptococcus pyogenes NOT DETECTED NOT DETECTED   A.calcoaceticus-baumannii NOT DETECTED NOT DETECTED   Bacteroides fragilis NOT DETECTED NOT DETECTED   Enterobacterales DETECTED (A) NOT DETECTED   Enterobacter cloacae complex DETECTED (A) NOT DETECTED   Escherichia coli NOT DETECTED NOT DETECTED   Klebsiella aerogenes NOT DETECTED NOT DETECTED   Klebsiella oxytoca NOT DETECTED NOT DETECTED   Klebsiella pneumoniae NOT DETECTED NOT DETECTED   Proteus species DETECTED (A) NOT DETECTED   Salmonella species NOT DETECTED NOT DETECTED   Serratia marcescens NOT DETECTED NOT DETECTED   Haemophilus influenzae NOT DETECTED NOT DETECTED   Neisseria meningitidis NOT DETECTED NOT DETECTED   Pseudomonas aeruginosa NOT  DETECTED NOT DETECTED   Stenotrophomonas maltophilia NOT DETECTED NOT DETECTED   Candida albicans NOT DETECTED NOT DETECTED   Candida auris NOT DETECTED NOT DETECTED   Candida glabrata NOT DETECTED NOT DETECTED   Candida krusei NOT DETECTED NOT DETECTED   Candida parapsilosis NOT DETECTED NOT DETECTED   Candida tropicalis NOT DETECTED NOT DETECTED   Cryptococcus neoformans/gattii NOT DETECTED NOT DETECTED   CTX-M ESBL NOT DETECTED NOT DETECTED   Carbapenem resistance IMP NOT DETECTED NOT DETECTED   Carbapenem resistance KPC NOT DETECTED NOT DETECTED   Carbapenem resistance NDM NOT DETECTED NOT DETECTED   Carbapenem resist OXA 48 LIKE NOT DETECTED NOT DETECTED   Carbapenem resistance VIM NOT DETECTED NOT DETECTED    Toniann Fail Jiyaan Steinhauser 02/05/2023  7:15 PM

## 2023-02-05 NOTE — Progress Notes (Addendum)
HD#0 Subjective:  Overnight Events: No acute events overnight.  Patient evaluated bedside.  Awake and alert.  Reports chronic pain at amputation sites.  Not worsening. Denies any other concerns.   Objective:  Vital signs in last 24 hours: Vitals:   02/04/23 2300 02/05/23 0418 02/05/23 0600 02/05/23 0907  BP: 103/66 (!) 86/33 122/66 104/64  Pulse: 90 99 75   Resp: 18 18    Temp: 98.5 F (36.9 C) 98.1 F (36.7 C)  98.2 F (36.8 C)  TempSrc: Axillary Axillary  Oral  SpO2: (!) 87%   98%  Weight:      Height:       Supplemental O2: Room Air SpO2: 98 %   Physical Exam:  General: Alert, laying in bed, in no acute distress CV: RRR  Pulmonary: Lungs CTAB. Normal effort. No wheezing or rales. Abdominal: Soft, nontender, nondistended. Normal bowel sounds. MSK: bilateral amputations of LE Skin: Warm and dry  Filed Weights   02/04/23 1428 02/04/23 1430 02/04/23 1938  Weight: 66.6 kg 66.6 kg 59.3 kg     Intake/Output Summary (Last 24 hours) at 02/05/2023 1013 Last data filed at 02/04/2023 1930 Gross per 24 hour  Intake 200 ml  Output 1300 ml  Net -1100 ml   Net IO Since Admission: -215.14 mL [02/05/23 1013]  Recent Labs    02/04/23 0518 02/04/23 2213 02/05/23 0639  GLUCAP 112* 166* 134*     Pertinent Labs:    Latest Ref Rng & Units 02/04/2023    5:08 AM 02/03/2023    5:04 PM 02/03/2023    4:43 PM  CBC  WBC 4.0 - 10.5 K/uL 8.6   9.7   Hemoglobin 13.0 - 17.0 g/dL 16.1  09.6  04.5   Hematocrit 39.0 - 52.0 % 34.8  37.0  35.8   Platelets 150 - 400 K/uL 134   153        Latest Ref Rng & Units 02/05/2023    6:51 AM 02/04/2023    5:08 AM 02/03/2023    5:04 PM  CMP  Glucose 70 - 99 mg/dL 409  811  914   BUN 8 - 23 mg/dL 44  94  93   Creatinine 0.61 - 1.24 mg/dL 7.82  9.56  2.13   Sodium 135 - 145 mmol/L 136  135  133   Potassium 3.5 - 5.1 mmol/L 4.0  4.3  4.7   Chloride 98 - 111 mmol/L 95  100  98   CO2 22 - 32 mmol/L 18  15    Calcium 8.9 - 10.3 mg/dL 8.0  7.4       Imaging: NM Pulmonary Perfusion Result Date: 02/04/2023 CLINICAL DATA:  Pulmonary embolism (PE) suspected, low to intermediate prob, positive D-dimer EXAM: NUCLEAR MEDICINE PERFUSION LUNG SCAN TECHNIQUE: Perfusion images were obtained in multiple projections after intravenous injection of radiopharmaceutical. Ventilation scans intentionally deferred if perfusion scan and chest x-ray adequate for interpretation during COVID 19 epidemic. RADIOPHARMACEUTICALS:  4.1 mCi Tc-99m MAA IV COMPARISON:  X-ray 02/03/2023 FINDINGS: Homogeneous distribution of radiotracer throughout both lungs. No perfusion defects. IMPRESSION: Normal perfusion lung scan.  No evidence of PE. Electronically Signed   By: Duanne Guess D.O.   On: 02/04/2023 13:52    Assessment/Plan:   Principal Problem:   Altered mental status Active Problems:   Hypertension, essential   Sepsis, viral (HCC)   S/P BKA (below knee amputation) unilateral, right (HCC)   ESRD on dialysis (HCC)   Type 2 diabetes  mellitus with diabetic peripheral angiopathy without gangrene, with long-term current use of insulin (HCC)   S/P AKA (above knee amputation) unilateral, left (HCC)   Uremia   Influenza A   Patient Summary: [name] is a 62 y.o. male with a history of ESRD on HD TTS, PAD  s/p R BKA L AKA, T2DM who presented with confusion and admitted for altered mental status on hospital day    Acute encephalopathy, resolved Suspected Uremia NAGMA + AGMA Mentation has improved. Remains afebrile. Suspect encephalopathy in setting of missed HD sessions and Flu A. Got HD yesterday and planned for today. Bicarb and renal function improving. Reports lives alone without assistance at home.  -Continue HD -PT and OT eval -Reassess patient this afternoon after PT/OT eval regarding stability for d/c  Sepsis 2/2 Flu A, resolved Positive for Flu A, remains afebrile, satting on room air. Continue supportive care.   ESRD HD TTS Per chart review,  last time he was dialyzed outpatient was on 01/25.  Nephrology following, got HD yesterday, plan for today.  -appreciate nephrology assistance -continue HD while inpatient -trend RFP   Elevated D-dimer Elevated D-dimer of 2.32, V/Q perfusion scan ordered by EDP was negative. Suspect elevation from missed HD and current infection which are improving.   PAD s/p R BKA L AKA Chronic pain. Not worsening. Able to perform ROM.  -Tylenol and po dilaudid 2 mg Q6H PRN  -Continue home medication Crestor 10 mg, Zetia 10 mg daily, and ASA 81 mg daily    Chronic Conditions Type II DM: continue home medication Semglee 15 units at bedtime HTN: continue home medication carvedilol 6.25 mg BID Tobacco Use: Nicotine patch   Diet: Renal diet IVF: PO intake VTE: heparin injection 5,000 Units Start: 02/04/23 0800 Code: Full PT/OT: None ID: none  Anticipated discharge to Home in 1 days pending medical stabilization and PT/OT evalution.  Rana Snare, DO 02/05/2023, 10:13 AM Pager: [phone] [facility]  Please contact the on call pager after 5 pm and on weekends at [phone].

## 2023-02-05 NOTE — Progress Notes (Signed)
PT Cancellation Note  Patient Details Name: [name] MRN: [id number] DOB: Jun 03, 1960   Cancelled Treatment:    Reason Eval/Treat Not Completed: Other (comment)  Imminent discharge order acknowledged.  Patient just finished being evaluated by OT - recommends SNF.  Will give pt time to rest and follow-up a bit later for physical therapy evaluation.  Kathlyn Sacramento, PT, DPT [facility]  Rehabilitation Services Physical Therapist Office: [phone] Website: [email or web address]  Berton Mount 02/05/2023, 10:35 AM

## 2023-02-05 NOTE — Care Management Obs Status (Signed)
MEDICARE OBSERVATION STATUS NOTIFICATION   Patient Details  Name: Erle Guster MRN: [id number] Date of Birth: 25-Feb-1960   Medicare Observation Status Notification Given:  Yes    Lawerance Sabal, RN 02/05/2023, 8:49 AM
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
## 2023-02-05 NOTE — Progress Notes (Signed)
Received patient in bed to unit.  Alert and oriented.  Informed consent signed and in chart.   TX duration:3hr  Patient tolerated well.  Transported back to the room  Alert, without acute distress.  Hand-off given to patient's nurse.   Access used: RTDC Access issues: poor flow - post tx cathflo block  Total UF removed: 0 Medication(s) given: cathflo, midodrine   02/05/23 1851  Vitals  Temp 98 F (36.7 C)  Temp Source Oral  BP (!) 88/75  MAP (mmHg) 81  Pulse Rate 96  ECG Heart Rate 78  Resp 17  Oxygen Therapy  SpO2 94 %  During Treatment Monitoring  Blood Flow Rate (mL/min) 199 mL/min  Arterial Pressure (mmHg) -77.77 mmHg  Venous Pressure (mmHg) 97.16 mmHg  TMP (mmHg) 26.46 mmHg  Ultrafiltration Rate (mL/min) 571 mL/min  Dialysate Flow Rate (mL/min) 300 ml/min  Dialysate Potassium Concentration 3  Dialysate Calcium Concentration 2.5  Duration of HD Treatment -hour(s) 3 hour(s)  Cumulative Fluid Removed (mL) per Treatment  -200.6  HD Safety Checks Performed Yes  Intra-Hemodialysis Comments Tx completed  Dialysis Fluid Bolus Normal Saline  Bolus Amount (mL) 300 mL  Post Treatment  Dialyzer Clearance Lightly streaked  Hemodialysis Intake (mL) 200 mL  Liters Processed 46  Fluid Removed (mL) 0 mL  Tolerated HD Treatment Yes  Hemodialysis Catheter Right Subclavian  No placement date or time found.   Orientation: Right  Access Location: Subclavian  Site Condition Other (Comment) (blocked with cathflo)  Post treatment catheter status Capped and Clamped      Freddi Starr, RN [facility]

## 2023-02-05 NOTE — Progress Notes (Signed)
PT Cancellation Note  Patient Details Name: [name] MRN: [id number] DOB: 1960-11-12   Cancelled Treatment:    Reason Eval/Treat Not Completed: Patient at procedure or test/unavailable   2nd follow-up attempt today, now off unit for dialysis. OT recommended SNF at d/c.  Will attempt comprehensive PT evaluation tomorrow.  Kathlyn Sacramento, PT, DPT [facility]  Rehabilitation Services Physical Therapist Office: [phone] Website: [email or web address]   [place] Mount 02/05/2023, 2:58 PM

## 2023-02-06 DIAGNOSIS — I10 Essential (primary) hypertension: Secondary | ICD-10-CM | POA: Diagnosis not present

## 2023-02-06 DIAGNOSIS — R4182 Altered mental status, unspecified: Secondary | ICD-10-CM | POA: Diagnosis present

## 2023-02-06 DIAGNOSIS — E1151 Type 2 diabetes mellitus with diabetic peripheral angiopathy without gangrene: Secondary | ICD-10-CM | POA: Diagnosis present

## 2023-02-06 DIAGNOSIS — E872 Acidosis, unspecified: Secondary | ICD-10-CM | POA: Diagnosis present

## 2023-02-06 DIAGNOSIS — Z23 Encounter for immunization: Secondary | ICD-10-CM | POA: Diagnosis not present

## 2023-02-06 DIAGNOSIS — I2510 Atherosclerotic heart disease of native coronary artery without angina pectoris: Secondary | ICD-10-CM | POA: Diagnosis not present

## 2023-02-06 DIAGNOSIS — G3184 Mild cognitive impairment, so stated: Secondary | ICD-10-CM | POA: Diagnosis not present

## 2023-02-06 DIAGNOSIS — Z833 Family history of diabetes mellitus: Secondary | ICD-10-CM | POA: Diagnosis not present

## 2023-02-06 DIAGNOSIS — G9340 Encephalopathy, unspecified: Secondary | ICD-10-CM | POA: Diagnosis present

## 2023-02-06 DIAGNOSIS — E876 Hypokalemia: Secondary | ICD-10-CM | POA: Diagnosis not present

## 2023-02-06 DIAGNOSIS — Z7401 Bed confinement status: Secondary | ICD-10-CM | POA: Diagnosis not present

## 2023-02-06 DIAGNOSIS — I5022 Chronic systolic (congestive) heart failure: Secondary | ICD-10-CM | POA: Diagnosis present

## 2023-02-06 DIAGNOSIS — Z89511 Acquired absence of right leg below knee: Secondary | ICD-10-CM | POA: Diagnosis not present

## 2023-02-06 DIAGNOSIS — Z992 Dependence on renal dialysis: Secondary | ICD-10-CM | POA: Diagnosis not present

## 2023-02-06 DIAGNOSIS — N390 Urinary tract infection, site not specified: Secondary | ICD-10-CM | POA: Diagnosis not present

## 2023-02-06 DIAGNOSIS — I120 Hypertensive chronic kidney disease with stage 5 chronic kidney disease or end stage renal disease: Secondary | ICD-10-CM | POA: Diagnosis not present

## 2023-02-06 DIAGNOSIS — Z794 Long term (current) use of insulin: Secondary | ICD-10-CM | POA: Diagnosis not present

## 2023-02-06 DIAGNOSIS — J09X2 Influenza due to identified novel influenza A virus with other respiratory manifestations: Secondary | ICD-10-CM | POA: Diagnosis present

## 2023-02-06 DIAGNOSIS — E1122 Type 2 diabetes mellitus with diabetic chronic kidney disease: Secondary | ICD-10-CM | POA: Diagnosis present

## 2023-02-06 DIAGNOSIS — Z8249 Family history of ischemic heart disease and other diseases of the circulatory system: Secondary | ICD-10-CM | POA: Diagnosis not present

## 2023-02-06 DIAGNOSIS — E8889 Other specified metabolic disorders: Secondary | ICD-10-CM | POA: Diagnosis present

## 2023-02-06 DIAGNOSIS — Z95828 Presence of other vascular implants and grafts: Secondary | ICD-10-CM | POA: Diagnosis not present

## 2023-02-06 DIAGNOSIS — R0602 Shortness of breath: Secondary | ICD-10-CM | POA: Diagnosis not present

## 2023-02-06 DIAGNOSIS — Z89612 Acquired absence of left leg above knee: Secondary | ICD-10-CM | POA: Diagnosis not present

## 2023-02-06 DIAGNOSIS — E1140 Type 2 diabetes mellitus with diabetic neuropathy, unspecified: Secondary | ICD-10-CM | POA: Diagnosis not present

## 2023-02-06 DIAGNOSIS — E871 Hypo-osmolality and hyponatremia: Secondary | ICD-10-CM | POA: Diagnosis not present

## 2023-02-06 DIAGNOSIS — G4700 Insomnia, unspecified: Secondary | ICD-10-CM | POA: Diagnosis not present

## 2023-02-06 DIAGNOSIS — E441 Mild protein-calorie malnutrition: Secondary | ICD-10-CM | POA: Diagnosis not present

## 2023-02-06 DIAGNOSIS — Z1152 Encounter for screening for COVID-19: Secondary | ICD-10-CM | POA: Diagnosis not present

## 2023-02-06 DIAGNOSIS — G319 Degenerative disease of nervous system, unspecified: Secondary | ICD-10-CM | POA: Diagnosis not present

## 2023-02-06 DIAGNOSIS — K219 Gastro-esophageal reflux disease without esophagitis: Secondary | ICD-10-CM | POA: Diagnosis not present

## 2023-02-06 DIAGNOSIS — N250 Renal osteodystrophy: Secondary | ICD-10-CM | POA: Diagnosis not present

## 2023-02-06 DIAGNOSIS — G9341 Metabolic encephalopathy: Secondary | ICD-10-CM | POA: Diagnosis not present

## 2023-02-06 DIAGNOSIS — N2581 Secondary hyperparathyroidism of renal origin: Secondary | ICD-10-CM | POA: Diagnosis present

## 2023-02-06 DIAGNOSIS — M6281 Muscle weakness (generalized): Secondary | ICD-10-CM | POA: Diagnosis not present

## 2023-02-06 DIAGNOSIS — R531 Weakness: Secondary | ICD-10-CM | POA: Diagnosis not present

## 2023-02-06 DIAGNOSIS — Z79899 Other long term (current) drug therapy: Secondary | ICD-10-CM | POA: Diagnosis not present

## 2023-02-06 DIAGNOSIS — J309 Allergic rhinitis, unspecified: Secondary | ICD-10-CM | POA: Diagnosis not present

## 2023-02-06 DIAGNOSIS — E785 Hyperlipidemia, unspecified: Secondary | ICD-10-CM | POA: Diagnosis not present

## 2023-02-06 DIAGNOSIS — A4189 Other specified sepsis: Secondary | ICD-10-CM | POA: Diagnosis present

## 2023-02-06 DIAGNOSIS — N186 End stage renal disease: Secondary | ICD-10-CM | POA: Diagnosis not present

## 2023-02-06 DIAGNOSIS — F17210 Nicotine dependence, cigarettes, uncomplicated: Secondary | ICD-10-CM | POA: Diagnosis not present

## 2023-02-06 DIAGNOSIS — D696 Thrombocytopenia, unspecified: Secondary | ICD-10-CM | POA: Diagnosis present

## 2023-02-06 DIAGNOSIS — D631 Anemia in chronic kidney disease: Secondary | ICD-10-CM | POA: Diagnosis not present

## 2023-02-06 DIAGNOSIS — I132 Hypertensive heart and chronic kidney disease with heart failure and with stage 5 chronic kidney disease, or end stage renal disease: Secondary | ICD-10-CM | POA: Diagnosis present

## 2023-02-06 DIAGNOSIS — E8809 Other disorders of plasma-protein metabolism, not elsewhere classified: Secondary | ICD-10-CM | POA: Diagnosis not present

## 2023-02-06 DIAGNOSIS — Z743 Need for continuous supervision: Secondary | ICD-10-CM | POA: Diagnosis not present

## 2023-02-06 DIAGNOSIS — R9082 White matter disease, unspecified: Secondary | ICD-10-CM | POA: Diagnosis not present

## 2023-02-06 DIAGNOSIS — I5040 Unspecified combined systolic (congestive) and diastolic (congestive) heart failure: Secondary | ICD-10-CM | POA: Diagnosis not present

## 2023-02-06 DIAGNOSIS — R404 Transient alteration of awareness: Secondary | ICD-10-CM | POA: Diagnosis not present

## 2023-02-06 DIAGNOSIS — G8929 Other chronic pain: Secondary | ICD-10-CM | POA: Diagnosis present

## 2023-02-06 DIAGNOSIS — I739 Peripheral vascular disease, unspecified: Secondary | ICD-10-CM | POA: Diagnosis not present

## 2023-02-06 LAB — GLUCOSE, CAPILLARY: Glucose-Capillary: 145 mg/dL — ABNORMAL HIGH (ref 70–99)

## 2023-02-06 LAB — RENAL FUNCTION PANEL
Albumin: 2.7 g/dL — ABNORMAL LOW (ref 3.5–5.0)
Anion gap: 15 (ref 5–15)
BUN: 34 mg/dL — ABNORMAL HIGH (ref 8–23)
CO2: 23 mmol/L (ref 22–32)
Calcium: 7.4 mg/dL — ABNORMAL LOW (ref 8.9–10.3)
Chloride: 93 mmol/L — ABNORMAL LOW (ref 98–111)
Creatinine, Ser: 5.29 mg/dL — ABNORMAL HIGH (ref 0.61–1.24)
GFR, Estimated: 11 mL/min — ABNORMAL LOW (ref >60)
Glucose, Bld: 120 mg/dL — ABNORMAL HIGH (ref 70–99)
Phosphorus: 4.4 mg/dL (ref 2.5–4.6)
Potassium: 3.3 mmol/L — ABNORMAL LOW (ref 3.5–5.1)
Sodium: 131 mmol/L — ABNORMAL LOW (ref 135–145)

## 2023-02-06 LAB — CBC
HCT: 32.3 % — ABNORMAL LOW (ref 39.0–52.0)
Hemoglobin: 11 g/dL — ABNORMAL LOW (ref 13.0–17.0)
MCH: 30.3 pg (ref 26.0–34.0)
MCHC: 34.1 g/dL (ref 30.0–36.0)
MCV: 89 fL (ref 80.0–100.0)
Platelets: 114 10*3/uL — ABNORMAL LOW (ref 150–400)
RBC: 3.63 MIL/uL — ABNORMAL LOW (ref 4.22–5.81)
RDW: 13 % (ref 11.5–15.5)
WBC: 6.8 10*3/uL (ref 4.0–10.5)
nRBC: 0 % (ref 0.0–0.2)

## 2023-02-06 LAB — MAGNESIUM: Magnesium: 2 mg/dL (ref 1.7–2.4)

## 2023-02-06 MED ORDER — POTASSIUM CHLORIDE CRYS ER 20 MEQ PO TBCR
20.0000 meq | EXTENDED_RELEASE_TABLET | Freq: Once | ORAL | Status: AC
  Administered 2023-02-06: 20 meq via ORAL
  Filled 2023-02-06: qty 1

## 2023-02-06 NOTE — Progress Notes (Addendum)
HD#0 Subjective:  Overnight Events: No acute events overnight.  Patient evaluated at bedside. Awake but unclear orientation. States name is "[name]" but able to state DOB correctly. States in [address] States year is "2024". Continues to have chronic pain at amputation sites but denies any other pains/discomfort.   Objective:  Vital signs in last 24 hours: Vitals:   02/05/23 1851 02/05/23 1856 02/05/23 1907 02/06/23 0004  BP: (!) 88/75 (!) 119/58  107/61  Pulse: 96   74  Resp: 17   18  Temp: 98 F (36.7 C)   98.5 F (36.9 C)  TempSrc: Oral   Oral  SpO2: 94%   93%  Weight:   64.4 kg   Height:       Supplemental O2: Room Air SpO2: 93 %   Physical Exam:  General: Awake, laying in bed, in no acute distress CV: regular rate  Pulmonary: Normal effort on RA.  MSK: bilateral amputations of LE without signs of infection or open wounds Neuro: awake but unclear orientation, able to move all extremities   Filed Weights   02/04/23 1938 02/05/23 1437 02/05/23 1907  Weight: 59.3 kg 64.2 kg 64.4 kg     Intake/Output Summary (Last 24 hours) at 02/06/2023 0604 Last data filed at 02/05/2023 1851 Gross per 24 hour  Intake 360 ml  Output 0 ml  Net 360 ml   Net IO Since Admission: 144.86 mL [02/06/23 0604]  Recent Labs    02/04/23 2213 02/05/23 0639 02/05/23 1131  GLUCAP 166* 134* 164*     Pertinent Labs:    Latest Ref Rng & Units 02/04/2023    5:08 AM 02/03/2023    5:04 PM 02/03/2023    4:43 PM  CBC  WBC 4.0 - 10.5 K/uL 8.6   9.7   Hemoglobin 13.0 - 17.0 g/dL 16.1  09.6  04.5   Hematocrit 39.0 - 52.0 % 34.8  37.0  35.8   Platelets 150 - 400 K/uL 134   153        Latest Ref Rng & Units 02/05/2023    6:51 AM 02/04/2023    5:08 AM 02/03/2023    5:04 PM  CMP  Glucose 70 - 99 mg/dL 409  811  914   BUN 8 - 23 mg/dL 44  94  93   Creatinine 0.61 - 1.24 mg/dL 7.82  9.56  2.13   Sodium 135 - 145 mmol/L 136  135  133   Potassium 3.5 - 5.1 mmol/L 4.0  4.3  4.7    Chloride 98 - 111 mmol/L 95  100  98   CO2 22 - 32 mmol/L 18  15    Calcium 8.9 - 10.3 mg/dL 8.0  7.4      Imaging: No results found.   Assessment/Plan:   Principal Problem:   Altered mental status Active Problems:   Hypertension, essential   Sepsis, viral (HCC)   S/P BKA (below knee amputation) unilateral, right (HCC)   ESRD on dialysis (HCC)   Type 2 diabetes mellitus with diabetic peripheral angiopathy without gangrene, with long-term current use of insulin (HCC)   S/P AKA (above knee amputation) unilateral, left (HCC)   Uremia   Influenza due to identified novel influenza A virus with other respiratory manifestations   Encephalopathy   Patient Summary: [name] is a 63 y.o. male with a history of ESRD on HD TTS, PAD  s/p R BKA L AKA, T2DM who presented with confusion and admitted for  altered mental status    Acute encephalopathy, resolved Suspected Uremia NAGMA + AGMA Awake today but unclear if patient is confused or confabulating. Remains afebrile. Suspect encephalopathy in setting of missed HD sessions and Flu A. Got HD past 2 days. Bicarb and renal function improving with HD. Reports lives alone without assistance at home. Recommend SNF at this time. -Continue HD -Pending SNF placement  -Monitor for changes in mentation  Sepsis 2/2 Flu A, resolved Positive for Flu A, remains afebrile, satting on room air. Continue supportive care.   ESRD HD TTS Per chart review, last time he was dialyzed outpatient was on 01/25.  Nephrology following, got HD yesterday, plan for today.  -appreciate nephrology assistance -continue HD while inpatient -trend RFP  Thrombocytopenia Chronic normocytic anemia PLT 114 from 134. Noted hx of thrombocytopenia. Hgb 11 which at baseline, chronic disease. No overt signs of bleeding. Monitor for now.  -trend CBC   Elevated D-dimer Elevated D-dimer of 2.32, V/Q perfusion scan ordered by EDP was negative. Suspect elevation from missed HD  and current infection which are improving.   PAD s/p R BKA L AKA Chronic pain. Not worsening. Able to perform ROM.  -Tylenol and po dilaudid 2 mg Q6H PRN  -Continue home medication Crestor 10 mg, Zetia 10 mg daily, and ASA 81 mg daily    Chronic Conditions Type II DM: continue home medication Semglee 15 units at bedtime HTN: continue home medication carvedilol 6.25 mg BID Tobacco Use: Nicotine patch   Diet: Renal diet IVF: PO intake VTE: heparin injection 5,000 Units Start: 02/04/23 0800 Code: Full PT/OT: None ID: none  Anticipated discharge to SNF in 2-3 days pending medical stabilization and placement.  Rana Snare, DO 02/06/2023, 6:04 AM Pager: [phone] [facility]  Please contact the on call pager after 5 pm and on weekends at [phone].

## 2023-02-06 NOTE — Progress Notes (Addendum)
Nephrology Follow-Up Consult note   Assessment/Recommendations: [name] is a/an 63 y.o. male with a past medical history significant for ESRD, admitted for AMS.      Dialysis Orders: Center: [place] [facility],S 4 hrs 180NRe 500/800 66 kg 2.0 K /2.0 Ca TDC - No Heparin  - Hectorol 5 mcg IV three times per week - Mircera 30 mcg IV q 4 weeks (last dose 01/25/2023)   Assessment/Plan:  AMS-likely some degree of uremia but has missed more dialysis in the past and this has not happened.  Could be multifactorial with influence of playing a big role but also progressive underlying dementia.    Influenza A-per primary  ESRD -received dialysis on Friday and Saturday.  Maintain TTS schedule.  Dialysis with some low flows on Saturday.  Cathflo was instilled at the end of dialysis.  Hypertension/volume  -appears euvolemic.  Maintain volume status with dialysis  Anemia  - hemoglobin at goal.  ESA if needed  Metabolic bone disease -corrected calcium near normal so we will continue to monitor.  Phosphorus near goal.  Continue to monitor with dialysis  Nutrition - Albumin low side. Protein supplements.    Recommendations conveyed to primary service.    Darnell Level [facility] 02/06/2023 9:00 AM  ___________________________________________________________  CC: AMS  Interval History/Subjective: Patient resting with no complaints today.  Tolerated dialysis yesterday with some low flows on his catheter   Medications:  Current Facility-Administered Medications  Medication Dose Route Frequency Provider Last Rate Last Admin   (feeding supplement) PROSource Plus liquid 30 mL  30 mL Oral BID BM Pola Corn, NP   30 mL at 02/06/23 0844   acetaminophen (TYLENOL) tablet 650 mg  650 mg Oral Q6H PRN Rana Snare, DO   650 mg at 02/04/23 1858   albumin human 25 % solution 25 g  25 g Intravenous Daily PRN Darnell Level, MD       aspirin EC tablet 81 mg  81 mg Oral Daily  Nooruddin, Saad, MD   81 mg at 02/06/23 0844   benzonatate (TESSALON) capsule 100 mg  100 mg Oral TID PRN Rana Snare, DO       carvedilol (COREG) tablet 6.25 mg  6.25 mg Oral BID WC Nooruddin, Saad, MD   6.25 mg at 02/06/23 0844   Chlorhexidine Gluconate Cloth 2 % PADS 6 each  6 each Topical Q0600 Pola Corn, NP   6 each at 02/06/23 0600   doxercalciferol (HECTOROL) injection 5 mcg  5 mcg Intravenous Q T,Th,Sa-HD Pola Corn, NP       ezetimibe (ZETIA) tablet 10 mg  10 mg Oral Daily Nooruddin, Saad, MD   10 mg at 02/06/23 0844   heparin injection 5,000 Units  5,000 Units Subcutaneous Q8H Nooruddin, Jason Fila, MD   5,000 Units at 02/06/23 9604   HYDROmorphone (DILAUDID) tablet 2 mg  2 mg Oral Q6H PRN Reymundo Poll, MD   2 mg at 02/05/23 2257   insulin glargine-yfgn (SEMGLEE) injection 15 Units  15 Units Subcutaneous QHS Nooruddin, Jason Fila, MD   15 Units at 02/05/23 2257   lidocaine (LIDODERM) 5 % 1 patch  1 patch Transdermal Q24H Rana Snare, DO   1 patch at 02/05/23 1243   loperamide (IMODIUM) capsule 2 mg  2 mg Oral PRN Rana Snare, DO       nicotine (NICODERM CQ - dosed in mg/24 hr) patch 7 mg  7 mg Transdermal Daily Nooruddin, Jason Fila, MD   7 mg  at 02/06/23 0846   rosuvastatin (CRESTOR) tablet 10 mg  10 mg Oral Daily Nooruddin, Saad, MD   10 mg at 02/06/23 0844      Review of Systems: 10 systems reviewed and negative except per interval history/subjective  Physical Exam: Vitals:   02/05/23 1856 02/06/23 0004  BP: (!) 119/58 107/61  Pulse:  74  Resp:  18  Temp:  98.5 F (36.9 C)  SpO2:  93%   No intake/output data recorded.  Intake/Output Summary (Last 24 hours) at 02/06/2023 0900 Last data filed at 02/05/2023 1851 Gross per 24 hour  Intake 240 ml  Output 0 ml  Net 240 ml   Constitutional: Chronically ill-appearing, lying in bed, no distress ENMT: ears and nose without scars or lesions, MMM CV: normal rate, no edema Respiratory: Bilateral chest rise,  normal work of breathing Gastrointestinal: soft, non-tender, no palpable masses or hernias Skin: no visible lesions or rashes Psych: Awake and alert, appropriate mood and affect   Test Results I personally reviewed new and old clinical labs and radiology tests Lab Results  Component Value Date   NA 131 (L) 02/06/2023   K 3.3 (L) 02/06/2023   CL 93 (L) 02/06/2023   CO2 23 02/06/2023   BUN 34 (H) 02/06/2023   CREATININE 5.29 (H) 02/06/2023   CALCIUM 7.4 (L) 02/06/2023   ALBUMIN 2.7 (L) 02/06/2023   PHOS 4.4 02/06/2023    CBC Recent Labs  Lab 02/03/23 1643 02/03/23 1704 02/04/23 0508 02/06/23 0710  WBC 9.7  --  8.6 6.8  HGB 12.2* 12.6* 11.6* 11.0*  HCT 35.8* 37.0* 34.8* 32.3*  MCV 90.2  --  91.3 89.0  PLT 153  --  134* 114*

## 2023-02-06 NOTE — Plan of Care (Signed)
   Problem: Education: Goal: Knowledge of General Education information will improve Description Including pain rating scale, medication(s)/side effects and non-pharmacologic comfort measures Outcome: Progressing   Problem: Health Behavior/Discharge Planning: Goal: Ability to manage health-related needs will improve Outcome: Progressing

## 2023-02-06 NOTE — Progress Notes (Signed)
Patient returned from Dialysis. 

## 2023-02-06 NOTE — Evaluation (Signed)
Physical Therapy Evaluation Patient Details Name: [name] MRN: [id number] DOB: Jan 03, 1961 Today's Date: 02/06/2023  History of Present Illness  Pt is a 63 yr old male who presented with AMS and generalized weakness. Found at home covered in feces. Pt with sepsis 2/2 to Flu A, FTT and suspected uremia. Recent fall,found at home and complaints of "stump pain".  PMH: PAD, ESRD on HD T Th Sat, DM2, CHF, HLD, HLD, R BKA, L AKA.  Clinical Impression  Patient presents with decreased mobility due to generalized weakness, decreased activity tolerance, decreased balance, decreased cognition and extremely high risk for falls.  Previously living alone and able to transition to wheelchair with his R prosthetic (which is not here today).  Patient found scooted down in bed and tangled in bed linen and reported could not reach his breakfast and unaware of call button on his right side.  Needing max A to transition to EOB and then did not tolerate upright for long till leaning back on bed and declined scooting OOB to recliner despite encouragement.  Assisted up in bed to eat breakfast with max A and bed features.  Previously living alone with intermittent family support a few times a week; able to get to wheelchair and to dialysis.  Feel he will benefit from skilled PT in the acute setting and from post-acute inpatient rehab (<3 hours/day) prior to d/c home.         If plan is discharge home, recommend the following: Assistance with cooking/housework;A lot of help with walking and/or transfers;A lot of help with bathing/dressing/bathroom;Assist for transportation;Help with stairs or ramp for entrance;Supervision due to cognitive status   Can travel by private vehicle   No    Equipment Recommendations None recommended by PT  Recommendations for Other Services       Functional Status Assessment Patient has had a recent decline in their functional status and demonstrates the ability to make significant  improvements in function in a reasonable and predictable amount of time.     Precautions / Restrictions Precautions Precautions: Fall Precaution Comments: R BKA, L AKA, very limited vision      Mobility  Bed Mobility Overal bed mobility: Needs Assistance Bed Mobility: Supine to Sit, Sit to Supine     Supine to sit: Mod assist, Used rails, HOB elevated Sit to supine: Supervision   General bed mobility comments: up to EOB with max cues, placed his R hand on rail and assisted to lift with L hand; to supine pt laying down despite cues and encouragement to sit up and transfer to chair, max A for repositioning, and scooting up to [facility] with bed flat and in trendelenberg, pt pulling on rail with L hand and scooting up with max A using bed pad, then repositioned on back and bed into chair position    Transfers                   General transfer comment: max encouragement and placed recliner to attempt anterior-posterior scooting transfer, but pt kept laying back down despite on EOB and maintaining balance briefly to take couple bites of his breakfast set up in front of him    Ambulation/Gait                  Stairs            Wheelchair Mobility     Tilt Bed    Modified Rankin (Stroke Patients Only)       Balance Overall  balance assessment: Needs assistance   Sitting balance-Leahy Scale: Fair Sitting balance - Comments: can sit on EOB with no UE support initiated eating breakfast, then leaned over on pillow until assisted up again, then he returned to resting on pillow                                     Pertinent Vitals/Pain Pain Assessment Pain Assessment: Faces Faces Pain Scale: Hurts little more Pain Location: "both stumps" Pain Descriptors / Indicators: Aching (reports chronic pain) Pain Intervention(s): Monitored during session    Home Living Family/patient expects to be discharged to:: Private residence Living Arrangements:  Alone Available Help at Discharge: Family;Available PRN/intermittently Type of Home: House Home Access: Ramped entrance       Home Layout: One level Home Equipment: Cane - single point;Tub bench;Other (comment);Rolling Walker (2 wheels);Wheelchair - manual;BSC/3in1 Additional Comments: daughter  and sister check on him a few times a week adn help with meal prep; groceries are delivered to his house.    Prior Function Prior Level of Function : Needs assist             Mobility Comments: usually uses his R prosthetic for transfers; uses a squat pivot transfer; has a L prosthetic leg howver it "don't fit"       Extremity/Trunk Assessment   Upper Extremity Assessment Upper Extremity Assessment: Defer to OT evaluation    Lower Extremity Assessment Lower Extremity Assessment: RLE deficits/detail;LLE deficits/detail RLE Deficits / Details: R BKA, L AKA moves in bed unaided, reports neuropathic pain bilaterally LLE Deficits / Details: R BKA, L AKA moves in bed unaided, reports neuropathic pain bilaterally    Cervical / Trunk Assessment Cervical / Trunk Assessment: Normal  Communication   Communication Communication: No apparent difficulties  Cognition Arousal: Alert Behavior During Therapy: Flat affect Overall Cognitive Status: Impaired/Different from baseline Area of Impairment: Orientation, Attention, Following commands, Memory, Safety/judgement, Awareness, Problem solving                 Orientation Level: Disoriented to, Place, Time, Situation Current Attention Level: Sustained Memory: Decreased short-term memory Following Commands: Follows one step commands consistently, Follows one step commands with increased time     Problem Solving: Slow processing, Decreased initiation, Difficulty sequencing, Requires verbal cues, Requires tactile cues General Comments: stated he was in [place], [place] (would not state when asked again)        General Comments  General comments (skin integrity, edema, etc.): Patient laying diagonal down in bed with bed covers tangled in and around his body and gown mostly off; stated he was in bad shape as he could not reach to eat breakfast; asked if he could reach call button on his R side but he could not see it    Exercises     Assessment/Plan    PT Assessment Patient needs continued PT services  PT Problem List Decreased strength;Decreased activity tolerance;Decreased balance;Decreased mobility;Decreased safety awareness;Decreased cognition       PT Treatment Interventions DME instruction;Patient/family education;Functional mobility training;Wheelchair mobility training;Therapeutic activities;Therapeutic exercise;Balance training    PT Goals (Current goals can be found in the Care Plan section)  Acute Rehab PT Goals PT Goal Formulation: Patient unable to participate in goal setting Time For Goal Achievement: 02/20/23 Potential to Achieve Goals: Fair    Frequency Min 1X/week     Co-evaluation  AM-PAC PT "6 Clicks" Mobility  Outcome Measure Help needed turning from your back to your side while in a flat bed without using bedrails?: A Lot Help needed moving from lying on your back to sitting on the side of a flat bed without using bedrails?: A Lot Help needed moving to and from a bed to a chair (including a wheelchair)?: Total Help needed standing up from a chair using your arms (e.g., wheelchair or bedside chair)?: Total Help needed to walk in hospital room?: Total Help needed climbing 3-5 steps with a railing? : Total 6 Click Score: 8    End of Session   Activity Tolerance: Patient tolerated treatment well Patient left: in bed;with call bell/phone within reach;with bed alarm set   PT Visit Diagnosis: Other abnormalities of gait and mobility (R26.89);Other symptoms and signs involving the nervous system (R29.898)    Time: 1610-9604 PT Time Calculation (min) (ACUTE ONLY): 21  min   Charges:   PT Evaluation $PT Eval High Complexity: 1 High   PT General Charges $$ ACUTE PT VISIT: 1 Visit         Sheran Lawless, PT Acute Rehabilitation Services Office:[phone] 02/06/2023   [name] 02/06/2023, 11:06 AM

## 2023-02-07 ENCOUNTER — Telehealth: Payer: Self-pay | Admitting: *Deleted

## 2023-02-07 LAB — CBC
HCT: 30.7 % — ABNORMAL LOW (ref 39.0–52.0)
Hemoglobin: 10.3 g/dL — ABNORMAL LOW (ref 13.0–17.0)
MCH: 29.9 pg (ref 26.0–34.0)
MCHC: 33.6 g/dL (ref 30.0–36.0)
MCV: 89 fL (ref 80.0–100.0)
Platelets: 143 10*3/uL — ABNORMAL LOW (ref 150–400)
RBC: 3.45 MIL/uL — ABNORMAL LOW (ref 4.22–5.81)
RDW: 13 % (ref 11.5–15.5)
WBC: 5.8 10*3/uL (ref 4.0–10.5)
nRBC: 0 % (ref 0.0–0.2)

## 2023-02-07 LAB — RENAL FUNCTION PANEL
Albumin: 2.7 g/dL — ABNORMAL LOW (ref 3.5–5.0)
Anion gap: 14 (ref 5–15)
BUN: 45 mg/dL — ABNORMAL HIGH (ref 8–23)
CO2: 22 mmol/L (ref 22–32)
Calcium: 6.9 mg/dL — ABNORMAL LOW (ref 8.9–10.3)
Chloride: 96 mmol/L — ABNORMAL LOW (ref 98–111)
Creatinine, Ser: 6.91 mg/dL — ABNORMAL HIGH (ref 0.61–1.24)
GFR, Estimated: 8 mL/min — ABNORMAL LOW (ref >60)
Glucose, Bld: 90 mg/dL (ref 70–99)
Phosphorus: 4.7 mg/dL — ABNORMAL HIGH (ref 2.5–4.6)
Potassium: 3.2 mmol/L — ABNORMAL LOW (ref 3.5–5.1)
Sodium: 132 mmol/L — ABNORMAL LOW (ref 135–145)

## 2023-02-07 LAB — URINALYSIS, ROUTINE W REFLEX MICROSCOPIC
Bilirubin Urine: NEGATIVE
Glucose, UA: NEGATIVE mg/dL
Ketones, ur: NEGATIVE mg/dL
Nitrite: NEGATIVE
Protein, ur: 100 mg/dL — AB
Specific Gravity, Urine: 1.012 (ref 1.005–1.030)
pH: 5 (ref 5.0–8.0)

## 2023-02-07 LAB — GLUCOSE, CAPILLARY
Glucose-Capillary: 78 mg/dL (ref 70–99)
Glucose-Capillary: 269 mg/dL — ABNORMAL HIGH (ref 70–99)
Glucose-Capillary: 139 mg/dL — ABNORMAL HIGH (ref 70–99)

## 2023-02-07 LAB — CULTURE, BLOOD (ROUTINE X 2): Culture  Setup Time: NO GROWTH

## 2023-02-07 MED ORDER — THIAMINE MONONITRATE 100 MG PO TABS: 200.0000 mg | ORAL_TABLET | Freq: Every day | ORAL | Status: DC

## 2023-02-07 MED ORDER — CHLORHEXIDINE GLUCONATE CLOTH 2 % EX PADS
6.0000 | MEDICATED_PAD | Freq: Every day | CUTANEOUS | Status: DC
  Administered 2023-02-08 – 2023-02-09 (×2): 6 via TOPICAL

## 2023-02-07 MED ORDER — POTASSIUM CHLORIDE CRYS ER 20 MEQ PO TBCR
20.0000 meq | EXTENDED_RELEASE_TABLET | Freq: Once | ORAL | Status: AC
  Administered 2023-02-07: 20 meq via ORAL
  Filled 2023-02-07: qty 1

## 2023-02-07 MED ORDER — HYDROMORPHONE HCL 2 MG PO TABS
1.0000 mg | ORAL_TABLET | Freq: Four times a day (QID) | ORAL | Status: DC | PRN
  Administered 2023-02-07 – 2023-02-11 (×3): 1 mg via ORAL
  Filled 2023-02-07 (×3): qty 1

## 2023-02-07 MED ORDER — ACETAMINOPHEN 500 MG PO TABS: 1000.0000 mg | ORAL_TABLET | Freq: Three times a day (TID) | ORAL | Status: DC | PRN

## 2023-02-07 MED ORDER — THIAMINE HCL 100 MG/ML IJ SOLN
500.0000 mg | INTRAVENOUS | Status: DC
  Administered 2023-02-07 – 2023-02-08 (×2): 500 mg via INTRAVENOUS
  Filled 2023-02-07 (×2): qty 5

## 2023-02-07 NOTE — Plan of Care (Signed)

## 2023-02-07 NOTE — Plan of Care (Signed)
  Problem: Activity: Goal: Risk for activity intolerance will decrease Outcome: Progressing   Problem: Coping: Goal: Level of anxiety will decrease Outcome: Progressing   Problem: Pain Managment: Goal: General experience of comfort will improve and/or be controlled Outcome: Progressing   Problem: Skin Integrity: Goal: Risk for impaired skin integrity will decrease Outcome: Progressing

## 2023-02-07 NOTE — Progress Notes (Signed)
HD#1 Subjective:  Overnight Events: No acute events overnight.  Patient evaluated at bedside. At times drowsy but awakens to voice. Not oriented to place place or time. When asked about his name, he started " whistling"  Reports pain at his amputation sites and his hands which are chronic  Spoke to pt's sister for collateral, says pt is 85% close to his baseline. Sister is 100% sure, pt is intentional with not responding to our questions.  Objective:  Vital signs in last 24 hours: Vitals:   02/06/23 2000 02/07/23 0000 02/07/23 0400 02/07/23 0821  BP: 103/79 112/69 120/66 128/67  Pulse: 68 65 62 62  Resp: 16 16 16 16   Temp: 98.7 F (37.1 C) 97.9 F (36.6 C) 98.1 F (36.7 C) 98.3 F (36.8 C)  TempSrc: Oral Oral Oral Axillary  SpO2: 99% 99% 100% 99%  Weight:      Height:       Supplemental O2: Room Air SpO2: 99 %   Physical Exam:  General: Awake but drifts off at times, laying in bed, in no acute distress CV: regular rate, and rhythm. Pulmonary: Normal effort on RA.  MSK: bilateral amputations of LE without signs of infection or open wounds Neuro: Not alert to person, place or time. No focal weakness.  Filed Weights   02/04/23 1938 02/05/23 1437 02/05/23 1907  Weight: 59.3 kg 64.2 kg 64.4 kg    No intake or output data in the 24 hours ending 02/07/23 1523  Net IO Since Admission: 144.86 mL [02/07/23 1523]  Recent Labs    02/05/23 1131 02/06/23 2138 02/07/23 1205  GLUCAP 164* 145* 78     Pertinent Labs:    Latest Ref Rng & Units 02/07/2023    6:21 AM 02/06/2023    7:10 AM 02/04/2023    5:08 AM  CBC  WBC 4.0 - 10.5 K/uL 5.8  6.8  8.6   Hemoglobin 13.0 - 17.0 g/dL 60.4  54.0  98.1   Hematocrit 39.0 - 52.0 % 30.7  32.3  34.8   Platelets 150 - 400 K/uL 143  114  134        Latest Ref Rng & Units 02/07/2023    6:21 AM 02/06/2023    7:10 AM 02/05/2023    6:51 AM  CMP  Glucose 70 - 99 mg/dL 90  191  478   BUN 8 - 23 mg/dL 45  34  44   Creatinine 0.61 - 1.24  mg/dL 2.95  6.21  3.08   Sodium 135 - 145 mmol/L 132  131  136   Potassium 3.5 - 5.1 mmol/L 3.2  3.3  4.0   Chloride 98 - 111 mmol/L 96  93  95   CO2 22 - 32 mmol/L 22  23  18    Calcium 8.9 - 10.3 mg/dL 6.9  7.4  8.0     Imaging: No results found.   Assessment/Plan:   Principal Problem:   Altered mental status Active Problems:   Hypertension, essential   Sepsis, viral (HCC)   S/P BKA (below knee amputation) unilateral, right (HCC)   ESRD on dialysis (HCC)   Type 2 diabetes mellitus with diabetic peripheral angiopathy without gangrene, with long-term current use of insulin (HCC)   S/P AKA (above knee amputation) unilateral, left (HCC)   Uremia   Influenza due to identified novel influenza A virus with other respiratory manifestations   Encephalopathy   Acute encephalopathy   Patient Summary: [name] is a 63  y.o. male with a history of ESRD on HD TTS, PAD  s/p R BKA L AKA, T2DM who presented with confusion and admitted for altered mental status    Acute encephalopathy Suspected Uremia, improved NAGMA + AGMA He has episodes of confabulation, and non responding to questions. Pt's sister insist pt is intentional, says he is close to his baseline. She agrees that pt is somewhat weak, and will benefit from SNF. No fevers, or white count.  Unclear the etiology of episodes of confabulating, I query if this is intentional vs secondary to Wernicke encephalopathy. No hx of alcohol use, but he is at risk due to malnutrition. Will evaluate this further with vitamin B-1 level, and MRI of the brain. Will also wean off hydromorphone to 1 mg. Will treat with IV thiamine supplementation.  -obtain brain MRI -thiamine IV 500 mg x 5 days then thiamine 200 mg daily  -decrease po dilaudid dose to 1 mg Q6H PRN (severe) -Continue HD -Pending SNF placement  -Monitor for changes in mentation status  Sepsis 2/2 Flu A, resolved Positive for Flu A, remains afebrile, satting on room air. Continue  supportive care.   ESRD HD TTS Per chart review, last time he was dialyzed outpatient was on 01/25.  Nephrology following for inpt HD.  -appreciate nephrology assistance -continue HD while inpatient -trend RFP  Thrombocytopenia Chronic normocytic anemia PLT stable 143. Noted hx of thrombocytopenia. Hgb 10.3 which at baseline 10-11, chronic disease. No overt signs of bleeding. Monitor for now.  -trend CBC   Elevated D-dimer Elevated D-dimer of 2.32, V/Q perfusion scan ordered by EDP was negative. Suspect elevation from missed HD and current infection which are improving.   PAD s/p R BKA L AKA Chronic pain. Not worsening. Able to perform ROM.  -Tylenol and po dilaudid 1 mg Q6H PRN  -Continue home medication Crestor 10 mg, Zetia 10 mg daily, and ASA 81 mg daily    Chronic Conditions Type II DM: continue home medication Semglee 15 units at bedtime HTN: continue home medication carvedilol 6.25 mg BID Tobacco Use: Nicotine patch   Diet: Renal diet IVF: PO intake VTE: heparin injection 5,000 Units Start: 02/04/23 0800 Code: Full PT/OT: None ID: none  Anticipated discharge to SNF in 2-3 days pending medical stabilization and placement.  Laretta Bolster, M.D.  Internal Medicine Resident, PGY-1 [facility]  Pager: [phone] 7:15 PM, 02/07/2023   **Please contact the on call pager after 5 pm and on weekends at [phone].**

## 2023-02-07 NOTE — Progress Notes (Incomplete)
Daily progress note delayed due to high acuity of our service at the moment; Patient was evaluated this morning with the team and orders were placed. Will

## 2023-02-07 NOTE — Telephone Encounter (Signed)
PCS form completed for [name] , patient has been in the hospital since last week.  Spoke with my nurse  Venita Sheffield as to what to do regarding the PCS form. Instructed to hold since patient medical status may change. Will hold until discharged.

## 2023-02-07 NOTE — Progress Notes (Addendum)
[facility] Progress Note   Subjective:    Seen and examined patient at bedside. He's in bed resting and denies any acute complaints. Noted K+ 3.2 today and already supplemented this morning. Next HD 2/4. Awaiting SNF placement.  Objective Vitals:   02/06/23 2000 02/07/23 0000 02/07/23 0400 02/07/23 0821  BP: 103/79 112/69 120/66 128/67  Pulse: 68 65 62 62  Resp: 16 16 16 16   Temp: 98.7 F (37.1 C) 97.9 F (36.6 C) 98.1 F (36.7 C) 98.3 F (36.8 C)  TempSrc: Oral Oral Oral Axillary  SpO2: 99% 99% 100% 99%  Weight:      Height:       Physical Exam General: Chronically ill-appearing; NAD Heart: S1 and S2; No MRGs Lungs:Clear throughout. Breathing unlabored Abdomen: Soft and non-tender Extremities:L AKA; R BKA Dialysis Access: [facility]   [facility] Weights   02/04/23 1938 02/05/23 1437 02/05/23 1907  Weight: 59.3 kg 64.2 kg 64.4 kg   No intake or output data in the 24 hours ending 02/07/23 1152  Additional Objective Labs: Basic Metabolic Panel: Recent Labs  Lab 02/05/23 0651 02/06/23 0710 02/07/23 0621  NA 136 131* 132*  K 4.0 3.3* 3.2*  CL 95* 93* 96*  CO2 18* 23 22  GLUCOSE 114* 120* 90  BUN 44* 34* 45*  CREATININE 5.92* 5.29* 6.91*  CALCIUM 8.0* 7.4* 6.9*  PHOS 5.8* 4.4 4.7*   Liver Function Tests: Recent Labs  Lab 02/02/23 1210 02/03/23 1643 02/05/23 0651 02/06/23 0710 02/07/23 0621  AST 32 33  --   --   --   ALT 18 22  --   --   --   ALKPHOS 58 57  --   --   --   BILITOT 1.0 0.7  --   --   --   PROT 6.7 7.4  --   --   --   ALBUMIN 3.0* 3.5 3.2* 2.7* 2.7*   No results for input(s): "LIPASE", "AMYLASE" in the last 168 hours. CBC: Recent Labs  Lab 02/02/23 1210 02/02/23 1238 02/03/23 1643 02/03/23 1704 02/04/23 0508 02/06/23 0710 02/07/23 0621  WBC 6.5  --  9.7  --  8.6 6.8 5.8  HGB 12.2*   < > 12.2*   < > 11.6* 11.0* 10.3*  HCT 37.7*   < > 35.8*   < > 34.8* 32.3* 30.7*  MCV 93.8  --  90.2  --  91.3 89.0 89.0  PLT 137*  --  153   --  134* 114* 143*   < > = values in this interval not displayed.   Blood Culture    Component Value Date/Time   SDES BLOOD SITE NOT SPECIFIED 02/03/2023 1657   SPECREQUEST  02/03/2023 1657    BOTTLES DRAWN AEROBIC AND ANAEROBIC Blood Culture results may not be optimal due to an inadequate volume of blood received in culture bottles   CULT  02/03/2023 1657    NO GROWTH 4 DAYS Performed at [facility], [address], [place], [place] [address]    REPTSTATUS PENDING 02/03/2023 1657    Cardiac Enzymes: No results for input(s): "CKTOTAL", "CKMB", "CKMBINDEX", "TROPONINI" in the last 168 hours. CBG: Recent Labs  Lab 02/04/23 0518 02/04/23 2213 02/05/23 0639 02/05/23 1131 02/06/23 2138  GLUCAP 112* 166* 134* 164* 145*   Iron Studies: No results for input(s): "IRON", "TIBC", "TRANSFERRIN", "FERRITIN" in the last 72 hours. Lab Results  Component Value Date   INR 1.1 02/03/2023   INR 1.3 (H)  08/25/2022   INR 1.4 (H) 08/11/2021   Studies/Results: No results found.  Medications:  albumin human     thiamine (VITAMIN B1) injection      (feeding supplement) PROSource Plus  30 mL Oral BID BM   aspirin EC  81 mg Oral Daily   carvedilol  6.25 mg Oral BID WC   Chlorhexidine Gluconate Cloth  6 each Topical Q0600   doxercalciferol  5 mcg Intravenous Q T,Th,Sa-HD   ezetimibe  10 mg Oral Daily   heparin  5,000 Units Subcutaneous Q8H   insulin glargine-yfgn  15 Units Subcutaneous QHS   lidocaine  1 patch Transdermal Q24H   nicotine  7 mg Transdermal Daily   rosuvastatin  10 mg Oral Daily   [START ON 02/12/2023] thiamine  200 mg Oral Daily    Dialysis Orders: [place] T,Th,S 4 hrs 180NRe 500/800 66 kg 2.0 K /2.0 Ca TDC - No Heparin  - Hectorol 5 mcg IV three times per week - Mircera 30 mcg IV q 4 weeks (last dose 01/25/2023)  Assessment/Plan: AMS-likely some degree of uremia but has missed more dialysis in the past and this has not happened.  Could be multifactorial with  influence of playing a big role but also progressive underlying dementia.   Influenza A-per primary ESRD -received dialysis on Friday and Saturday last week.  Maintain TTS schedule.  Dialysis with some low flows on Saturday.  Cathflo was instilled at the end of dialysis, will see how he does tomorrow. K+ 3.2 today, will adjust bath per HD protocol. Hypertension/volume  -appears euvolemic.  Maintain volume status with dialysis Anemia  - hemoglobin at goal.  ESA if needed Metabolic bone disease -corrected calcium near normal so we will continue to monitor.  Phosphorus near goal.  Continue to monitor with dialysis Nutrition - Albumin low side. Protein supplements.   Salome Holmes, NP [facility] 02/07/2023,11:52 AM  LOS: 1 day

## 2023-02-07 NOTE — Progress Notes (Signed)
Pt receives out-pt HD at [facility] on TTS 11:50 am chair time. Will assist as needed.   Olivia Canter Renal Navigator [phone]

## 2023-02-08 ENCOUNTER — Inpatient Hospital Stay (HOSPITAL_COMMUNITY): Payer: 59

## 2023-02-08 DIAGNOSIS — R404 Transient alteration of awareness: Secondary | ICD-10-CM | POA: Diagnosis not present

## 2023-02-08 LAB — CBC
HCT: 31.3 % — ABNORMAL LOW (ref 39.0–52.0)
Hemoglobin: 10.6 g/dL — ABNORMAL LOW (ref 13.0–17.0)
MCH: 30.2 pg (ref 26.0–34.0)
MCHC: 33.9 g/dL (ref 30.0–36.0)
MCV: 89.2 fL (ref 80.0–100.0)
Platelets: 181 10*3/uL (ref 150–400)
RBC: 3.51 MIL/uL — ABNORMAL LOW (ref 4.22–5.81)
RDW: 12.9 % (ref 11.5–15.5)
WBC: 6 10*3/uL (ref 4.0–10.5)
nRBC: 0 % (ref 0.0–0.2)

## 2023-02-08 LAB — RENAL FUNCTION PANEL
Albumin: 2.8 g/dL — ABNORMAL LOW (ref 3.5–5.0)
Anion gap: 17 — ABNORMAL HIGH (ref 5–15)
BUN: 50 mg/dL — ABNORMAL HIGH (ref 8–23)
CO2: 21 mmol/L — ABNORMAL LOW (ref 22–32)
Calcium: 7 mg/dL — ABNORMAL LOW (ref 8.9–10.3)
Chloride: 94 mmol/L — ABNORMAL LOW (ref 98–111)
Creatinine, Ser: 7.54 mg/dL — ABNORMAL HIGH (ref 0.61–1.24)
GFR, Estimated: 7 mL/min — ABNORMAL LOW (ref >60)
Glucose, Bld: 107 mg/dL — ABNORMAL HIGH (ref 70–99)
Phosphorus: 5.7 mg/dL — ABNORMAL HIGH (ref 2.5–4.6)
Potassium: 3.6 mmol/L (ref 3.5–5.1)
Sodium: 132 mmol/L — ABNORMAL LOW (ref 135–145)

## 2023-02-08 LAB — GLUCOSE, CAPILLARY
Glucose-Capillary: 105 mg/dL — ABNORMAL HIGH (ref 70–99)
Glucose-Capillary: 140 mg/dL — ABNORMAL HIGH (ref 70–99)

## 2023-02-08 LAB — CULTURE, BLOOD (ROUTINE X 2): Culture: NO GROWTH

## 2023-02-08 MED ORDER — LIDOCAINE HCL (PF) 1 % IJ SOLN: 5.0000 mL | INTRAMUSCULAR | Status: DC | PRN

## 2023-02-08 MED ORDER — LIDOCAINE-PRILOCAINE 2.5-2.5 % EX CREA: 1.0000 | TOPICAL_CREAM | CUTANEOUS | Status: DC | PRN

## 2023-02-08 MED ORDER — ALTEPLASE 2 MG IJ SOLR: 2.0000 mg | Freq: Once | INTRAMUSCULAR | Status: DC | PRN

## 2023-02-08 MED ORDER — ACETAMINOPHEN 500 MG PO TABS
1000.0000 mg | ORAL_TABLET | Freq: Three times a day (TID) | ORAL | Status: DC
  Administered 2023-02-08 – 2023-02-11 (×11): 1000 mg via ORAL
  Filled 2023-02-08 (×11): qty 2

## 2023-02-08 MED ORDER — ANTICOAGULANT SODIUM CITRATE 4% (200MG/5ML) IV SOLN: 5.0000 mL | Status: DC | PRN

## 2023-02-08 MED ORDER — HEPARIN SODIUM (PORCINE) 1000 UNIT/ML DIALYSIS: 1000.0000 [IU] | INTRAMUSCULAR | Status: DC | PRN

## 2023-02-08 MED ORDER — PENTAFLUOROPROP-TETRAFLUOROETH EX AERO: 1.0000 | INHALATION_SPRAY | CUTANEOUS | Status: DC | PRN

## 2023-02-08 MED ORDER — THIAMINE MONONITRATE 100 MG PO TABS
200.0000 mg | ORAL_TABLET | Freq: Every day | ORAL | Status: DC
  Administered 2023-02-09 – 2023-02-11 (×3): 200 mg via ORAL
  Filled 2023-02-08 (×3): qty 2

## 2023-02-08 NOTE — Progress Notes (Signed)
Pt received back from dialysis. VSS. Bed alarms on.

## 2023-02-08 NOTE — Progress Notes (Signed)
 [facility] Progress Note   Subjective:    Seen and examined patient at bedside. No acute issues. Plan for HD this afternoon.  Objective Vitals:   02/07/23 2044 02/08/23 0352 02/08/23 0810 02/08/23 1203  BP: 128/64 129/63 137/73 100/61  Pulse: 64 60 (!) 58 60  Resp: 17 17 17    Temp: 98 F (36.7 C) 97.9 F (36.6 C) 97.6 F (36.4 C)   TempSrc: Oral Oral Oral   SpO2: 100% 97% 98% 100%  Weight:      Height:       Physical Exam General: Chronically ill-appearing; NAD Heart: S1 and S2; No MRGs Lungs:Clear throughout. Breathing unlabored Abdomen: Soft and non-tender Extremities:L AKA; R BKA Dialysis Access: [facility]   [facility] Weights   02/04/23 1938 02/05/23 1437 02/05/23 1907  Weight: 59.3 kg 64.2 kg 64.4 kg    Intake/Output Summary (Last 24 hours) at 02/08/2023 1207 Last data filed at 02/08/2023 0430 Gross per 24 hour  Intake 420 ml  Output 475 ml  Net -55 ml    Additional Objective Labs: Basic Metabolic Panel: Recent Labs  Lab 02/06/23 0710 02/07/23 0621 02/08/23 0523  NA 131* 132* 132*  K 3.3* 3.2* 3.6  CL 93* 96* 94*  CO2 23 22 21*  GLUCOSE 120* 90 107*  BUN 34* 45* 50*  CREATININE 5.29* 6.91* 7.54*  CALCIUM  7.4* 6.9* 7.0*  PHOS 4.4 4.7* 5.7*   Liver Function Tests: Recent Labs  Lab 02/02/23 1210 02/03/23 1643 02/05/23 0651 02/06/23 0710 02/07/23 0621 02/08/23 0523  AST 32 33  --   --   --   --   ALT 18 22  --   --   --   --   ALKPHOS 58 57  --   --   --   --   BILITOT 1.0 0.7  --   --   --   --   PROT 6.7 7.4  --   --   --   --   ALBUMIN  3.0* 3.5   < > 2.7* 2.7* 2.8*   < > = values in this interval not displayed.   No results for input(s): LIPASE, AMYLASE in the last 168 hours. CBC: Recent Labs  Lab 02/03/23 1643 02/03/23 1704 02/04/23 0508 02/06/23 0710 02/07/23 0621 02/08/23 0523  WBC 9.7  --  8.6 6.8 5.8 6.0  HGB 12.2*   < > 11.6* 11.0* 10.3* 10.6*  HCT 35.8*   < > 34.8* 32.3* 30.7* 31.3*  MCV 90.2  --  91.3 89.0  89.0 89.2  PLT 153  --  134* 114* 143* 181   < > = values in this interval not displayed.   Blood Culture    Component Value Date/Time   SDES BLOOD SITE NOT SPECIFIED 02/03/2023 1657   SPECREQUEST  02/03/2023 1657    BOTTLES DRAWN AEROBIC AND ANAEROBIC Blood Culture results may not be optimal due to an inadequate volume of blood received in culture bottles   CULT  02/03/2023 1657    NO GROWTH 5 DAYS Performed at [facility], [address] [place], [place] [address]    REPTSTATUS 02/08/2023 FINAL 02/03/2023 1657    Cardiac Enzymes: No results for input(s): CKTOTAL, CKMB, CKMBINDEX, TROPONINI in the last 168 hours. CBG: Recent Labs  Lab 02/07/23 1205 02/07/23 1648 02/07/23 2124 02/08/23 0611 02/08/23 1204  GLUCAP 78 269* 139* 105* 140*   Iron Studies: No results for input(s): IRON, TIBC, TRANSFERRIN, FERRITIN in the last 72  hours. Lab Results  Component Value Date   INR 1.1 02/03/2023   INR 1.3 (H) 08/25/2022   INR 1.4 (H) 08/11/2021   Studies/Results: MR BRAIN WO CONTRAST Result Date: 02/08/2023 CLINICAL DATA:  63 year old male with altered mental status. EXAM: MRI HEAD WITHOUT CONTRAST TECHNIQUE: Multiplanar, multiecho pulse sequences of the brain and surrounding structures were obtained without intravenous contrast. COMPARISON:  Head CT 02/03/2023 and earlier. FINDINGS: Brain: No restricted diffusion to suggest acute infarction. No midline shift, mass effect, evidence of mass lesion, ventriculomegaly, extra-axial collection or acute intracranial hemorrhage. Cervicomedullary junction and pituitary are within normal limits. In general cerebral volume seems normal for age but there is evidence of disproportionate mesial temporal lobe atrophy on coronal image 17. No cortical encephalomalacia or chronic cerebral blood products identified. But moderate nonspecific cerebral white matter T2 and FLAIR hyperintensity which is mostly periventricular. Multiple  chronic lacunar infarcts in the right deep gray matter nuclei including the basal ganglia and thalamus on that side (series 5, image 15). Brainstem and cerebellum appear negative. Vascular: Major intracranial vascular flow voids are preserved. Skull and upper cervical spine: Partially visible cervical spine degeneration. Background bone marrow signal within normal limits. Sinuses/Orbits: Negative orbits. Mild bilateral paranasal sinus mucosal thickening. Mild left mastoid air cell fluid. Right mastoids are clear. Other: Negative visible nasopharynx. Grossly normal other visible internal auditory structures. Negative visible scalp and face. IMPRESSION: 1. No acute intracranial abnormality. 2. 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 Moderate other nonspecific cerebral white matter signal changes therefore might also be small vessel disease related. 3. Evidence of disproportionate mesial temporal lobe atrophy raising the possibility of neurodegenerative disease, dementia. Cerebral Atrophy (ICD10-G31.9). Electronically Signed   By: VEAR Hurst M.D.   On: 02/08/2023 05:44    Medications:  albumin  human     anticoagulant sodium citrate      thiamine  (VITAMIN B1) injection 500 mg (02/08/23 0851)    (feeding supplement) PROSource Plus  30 mL Oral BID BM   acetaminophen   1,000 mg Oral Q8H   aspirin  EC  81 mg Oral Daily   carvedilol   6.25 mg Oral BID WC   Chlorhexidine  Gluconate Cloth  6 each Topical Q0600   doxercalciferol   5 mcg Intravenous Q T,Th,Sa-HD   ezetimibe   10 mg Oral Daily   heparin   5,000 Units Subcutaneous Q8H   insulin  glargine-yfgn  15 Units Subcutaneous QHS   lidocaine   1 patch Transdermal Q24H   nicotine   7 mg Transdermal Daily   rosuvastatin   10 mg Oral Daily   [START ON 02/12/2023] thiamine   200 mg Oral Daily    Dialysis Orders: [place] T,Th,S 4 hrs 180NRe 500/800 66 kg 2.0 K /2.0 Ca TDC - No Heparin   - Hectorol  5 mcg IV three times per week - Mircera 30 mcg IV q  4 weeks (last dose 01/25/2023)  Assessment/Plan: AMS-likely some degree of uremia but has missed more dialysis in the past and this has not happened.  Could be multifactorial with influence of playing a big role but also progressive underlying dementia.   Influenza A-per primary ESRD -received dialysis on 1/31 and 2/1 last week.  Maintain TTS schedule.  Dialysis with some low flows on 2/1.  Cathflo was instilled at the end of dialysis, will see how he does today. K+ 3.6 today, will adjust bath per HD protocol. Hypertension/volume  -appears euvolemic.  Maintain volume status with dialysis Anemia  - hemoglobin at goal.  ESA if needed Metabolic bone disease -  corrected calcium  near normal so we will continue to monitor.  Phosphorus near goal.  Continue to monitor with dialysis Nutrition - Albumin  low side. Protein supplements.   Charmaine Piety, NP [facility] 02/08/2023,12:07 PM  LOS: 2 days

## 2023-02-08 NOTE — Progress Notes (Signed)
 Pt tolerated tx well. Catheter lines positional.  02/08/23 1900  Vitals  Temp 98 F (36.7 C)  Temp Source Oral  BP (!) 130/50  BP Location Right Arm  BP Method Automatic  Patient Position (if appropriate) Lying  Pulse Rate 60  ECG Heart Rate 61  Resp 20  During Treatment Monitoring  Intra-Hemodialysis Comments Tx completed  Post Treatment  Dialyzer Clearance Lightly streaked  Hemodialysis Intake (mL) 0 mL  Liters Processed 68  Fluid Removed (mL) 500 mL  Tolerated HD Treatment Yes  Post-Hemodialysis Comments Pt goal met.  Hemodialysis Catheter Right Subclavian  No placement date or time found.   Orientation: Right  Access Location: Subclavian  Site Condition No complications  Blue Lumen Status Heparin  locked  Red Lumen Status Heparin  locked  Catheter fill solution Heparin  1000 units/ml  Catheter fill volume (Arterial) 1.6 cc  Catheter fill volume (Venous) 1.6  Dressing Type Transparent;Gauze/Drain sponge  Dressing Status Antimicrobial disc/dressing in place;Clean, Dry, Intact  Interventions New dressing  Drainage Description None  Dressing Change Due 02/11/23  Post treatment catheter status Capped and Clamped

## 2023-02-08 NOTE — Progress Notes (Signed)
 PT Cancellation Note  Patient Details Name: [name] MRN: [id number] DOB: December 29, 1960   Cancelled Treatment:    Reason Eval/Treat Not Completed: (P) Patient declined, no reason specified, pt declining all mobility despite encouragement. Pt asking for assist with repositioning on side, pt rolling to L with min A and pillows placed for comfort. Will check back as schedule allows to continue with PT POC.  Therisa SAUNDERS. PTA Acute Rehabilitation Services Office: [phone]    Therisa CHRISTELLA Boor 02/08/2023, 12:40 PM

## 2023-02-08 NOTE — Progress Notes (Addendum)
 HD#2 Subjective:  Overnight Events: No acute events overnight.  Patient evaluated at bedside.  Awake and alert , answers questions appropriately as he wishes.  Will follow-up with social worker about SNF. Objective:  Vital signs in last 24 hours: Vitals:   02/07/23 0821 02/07/23 2044 02/08/23 0352 02/08/23 0810  BP: 128/67 128/64 129/63 137/73  Pulse: 62 64 60 (!) 58  Resp: 16 17 17 17   Temp: 98.3 F (36.8 C) 98 F (36.7 C) 97.9 F (36.6 C) 97.6 F (36.4 C)  TempSrc: Axillary Oral Oral Oral  SpO2: 99% 100% 97% 98%  Weight:      Height:       Supplemental O2: Room Air SpO2: 98 %   Physical Exam:   General: NAD. CV: regular rate, and rhythm. Pulmonary: Normal effort on RA.  MSK: bilateral amputations of LE without signs of infection or open wounds Neuro: Awake and alert to person, place and time.  Will intermittently choose to not answer thesse questions No focal weakness.  Filed Weights   02/04/23 1938 02/05/23 1437 02/05/23 1907  Weight: 59.3 kg 64.2 kg 64.4 kg     Intake/Output Summary (Last 24 hours) at 02/08/2023 1114 Last data filed at 02/08/2023 0430 Gross per 24 hour  Intake 420 ml  Output 475 ml  Net -55 ml    Net IO Since Admission: 329.86 mL [02/08/23 1114]  Recent Labs    02/07/23 1648 02/07/23 2124 02/08/23 0611  GLUCAP 269* 139* 105*     Pertinent Labs:    Latest Ref Rng & Units 02/08/2023    5:23 AM 02/07/2023    6:21 AM 02/06/2023    7:10 AM  CBC  WBC 4.0 - 10.5 K/uL 6.0  5.8  6.8   Hemoglobin 13.0 - 17.0 g/dL 89.3  89.6  88.9   Hematocrit 39.0 - 52.0 % 31.3  30.7  32.3   Platelets 150 - 400 K/uL 181  143  114        Latest Ref Rng & Units 02/08/2023    5:23 AM 02/07/2023    6:21 AM 02/06/2023    7:10 AM  CMP  Glucose 70 - 99 mg/dL 892  90  879   BUN 8 - 23 mg/dL 50  45  34   Creatinine 0.61 - 1.24 mg/dL 2.45  3.08  4.70   Sodium 135 - 145 mmol/L 132  132  131   Potassium 3.5 - 5.1 mmol/L 3.6  3.2  3.3   Chloride 98 - 111 mmol/L  94  96  93   CO2 22 - 32 mmol/L 21  22  23    Calcium  8.9 - 10.3 mg/dL 7.0  6.9  7.4     Imaging: MR BRAIN WO CONTRAST Result Date: 02/08/2023 CLINICAL DATA:  63 year old male with altered mental status. EXAM: MRI HEAD WITHOUT CONTRAST TECHNIQUE: Multiplanar, multiecho pulse sequences of the brain and surrounding structures were obtained without intravenous contrast. COMPARISON:  Head CT 02/03/2023 and earlier. FINDINGS: Brain: No restricted diffusion to suggest acute infarction. No midline shift, mass effect, evidence of mass lesion, ventriculomegaly, extra-axial collection or acute intracranial hemorrhage. Cervicomedullary junction and pituitary are within normal limits. In general cerebral volume seems normal for age but there is evidence of disproportionate mesial temporal lobe atrophy on coronal image 17. No cortical encephalomalacia or chronic cerebral blood products identified. But moderate nonspecific cerebral white matter T2 and FLAIR hyperintensity which is mostly periventricular. Multiple chronic lacunar infarcts in the right  deep gray matter nuclei including the basal ganglia and thalamus on that side (series 5, image 15). Brainstem and cerebellum appear negative. Vascular: Major intracranial vascular flow voids are preserved. Skull and upper cervical spine: Partially visible cervical spine degeneration. Background bone marrow signal within normal limits. Sinuses/Orbits: Negative orbits. Mild bilateral paranasal sinus mucosal thickening. Mild left mastoid air cell fluid. Right mastoids are clear. Other: Negative visible nasopharynx. Grossly normal other visible internal auditory structures. Negative visible scalp and face. IMPRESSION: 1. No acute intracranial abnormality. 2. 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 Moderate other nonspecific cerebral white matter signal changes therefore might also be small vessel disease related. 3. Evidence of disproportionate mesial  temporal lobe atrophy raising the possibility of neurodegenerative disease, dementia. Cerebral Atrophy (ICD10-G31.9). Electronically Signed   By: VEAR Hurst M.D.   On: 02/08/2023 05:44     Assessment/Plan:   Principal Problem:   Altered mental status Active Problems:   Hypertension, essential   Sepsis, viral (HCC)   S/P BKA (below knee amputation) unilateral, right (HCC)   ESRD on dialysis (HCC)   Type 2 diabetes mellitus with diabetic peripheral angiopathy without gangrene, with long-term current use of insulin  (HCC)   S/P AKA (above knee amputation) unilateral, left (HCC)   Uremia   Influenza due to identified novel influenza A virus with other respiratory manifestations   Encephalopathy   Acute encephalopathy   Patient Summary: [name] is a 63 y.o. male with a history of ESRD on HD TTS, PAD  s/p R BKA L AKA, T2DM who presented with confusion and admitted for altered mental status    Acute encephalopathy, resolved Suspected Uremia, improved NAGMA + AGMA Mental status, improved.  He is awake and alert this morning, I have low suspicion for confabulation, I spoke with patient's sister, she confirmed patient is closer to his baseline, and regarding his mental status, he selectively decides not to answer our questions. He has his home prosthetics, with the patient ambulate with physical therapy/outpatient therapy.  Blood culture grew 1/4 Urealyticum, likely a contaminant. UA  +LE, rare bacteria, and pyuria.  Low suspicion for UTI, I think it looks infected in the setting of urinary stasis( anuric). Will not treat with antibiotics, he is afebrile, no leukocytosis.  Social worker helping with SNF placement, will follow-up on updates.  Continue with IV thiamine  supplementation, hydromorphone  1 mg for pain.   -thiamine  IV 500 mg x 5 days then thiamine  200 mg daily  -Continue po dilaudid  dose to 1 mg Q6H PRN (severe) -Continue HD -Pending SNF placement  -Monitor for changes in mentation  status  # Concern for Alzheimer's on MRI # Suspected Dementia Evidence of mesial temporal lobe atrophy on MRI to the brain.  Findings could be concerning for Alzheimer's. -Follow-up with neurology/geriatrics outpatient?  ESRD HD TTS On dialysis Tuesday Thursday Saturday.  Will be dialyzed today.  Stable electrolytes.  Thrombocytopenia Chronic normocytic anemia PLT stable 180.  Noted hx of thrombocytopenia. Hgb 10.3 which at baseline 10-11, chronic disease. No overt signs of bleeding. Monitor for now.  -trend CBC   Elevated D-dimer Elevated D-dimer of 2.32, V/Q perfusion scan ordered by EDP was negative. Suspect elevation from missed HD and current infection which are improving.   PAD s/p R BKA L AKA Chronic pain. Not worsening. Able to perform ROM.  -Tylenol  and po dilaudid  1 mg Q6H PRN  -Continue home medication Crestor  10 mg, Zetia  10 mg daily, and ASA 81 mg daily  Chronic Conditions Type II DM: continue home medication Semglee  15 units at bedtime HTN: continue home medication carvedilol  6.25 mg BID Tobacco Use: Nicotine  patch   Diet: Renal diet IVF: PO intake VTE: heparin  injection 5,000 Units Start: 02/04/23 0800 Code: Full PT/OT: None ID: none  Anticipated discharge to SNF in 2-3 days pending medical stabilization and placement.  Lorrine Killilea, M.D.  Internal Medicine Resident, PGY-1 [name]  Pager: [phone] 11:14 AM, 02/08/2023   **Please contact the on call pager after 5 pm and on weekends at [phone].**

## 2023-02-09 DIAGNOSIS — R404 Transient alteration of awareness: Secondary | ICD-10-CM | POA: Diagnosis not present

## 2023-02-09 LAB — GLUCOSE, CAPILLARY
Glucose-Capillary: 114 mg/dL — ABNORMAL HIGH (ref 70–99)
Glucose-Capillary: 136 mg/dL — ABNORMAL HIGH (ref 70–99)
Glucose-Capillary: 178 mg/dL — ABNORMAL HIGH (ref 70–99)

## 2023-02-09 LAB — RENAL FUNCTION PANEL
Albumin: 2.9 g/dL — ABNORMAL LOW (ref 3.5–5.0)
Anion gap: 14 (ref 5–15)
BUN: 26 mg/dL — ABNORMAL HIGH (ref 8–23)
CO2: 23 mmol/L (ref 22–32)
Calcium: 7.6 mg/dL — ABNORMAL LOW (ref 8.9–10.3)
Chloride: 96 mmol/L — ABNORMAL LOW (ref 98–111)
Creatinine, Ser: 5.1 mg/dL — ABNORMAL HIGH (ref 0.61–1.24)
GFR, Estimated: 12 mL/min — ABNORMAL LOW (ref >60)
Glucose, Bld: 95 mg/dL (ref 70–99)
Phosphorus: 4.2 mg/dL (ref 2.5–4.6)
Potassium: 3.5 mmol/L (ref 3.5–5.1)
Sodium: 133 mmol/L — ABNORMAL LOW (ref 135–145)

## 2023-02-09 LAB — CBC
HCT: 32.2 % — ABNORMAL LOW (ref 39.0–52.0)
Hemoglobin: 11 g/dL — ABNORMAL LOW (ref 13.0–17.0)
MCH: 30.4 pg (ref 26.0–34.0)
MCHC: 34.2 g/dL (ref 30.0–36.0)
MCV: 89 fL (ref 80.0–100.0)
Platelets: 205 10*3/uL (ref 150–400)
RBC: 3.62 MIL/uL — ABNORMAL LOW (ref 4.22–5.81)
RDW: 13.2 % (ref 11.5–15.5)
WBC: 6.4 10*3/uL (ref 4.0–10.5)
nRBC: 0 % (ref 0.0–0.2)

## 2023-02-09 LAB — URINE CULTURE: Special Requests: NORMAL

## 2023-02-09 MED ORDER — CHLORHEXIDINE GLUCONATE CLOTH 2 % EX PADS
6.0000 | MEDICATED_PAD | Freq: Every day | CUTANEOUS | Status: DC
  Administered 2023-02-10 – 2023-02-11 (×2): 6 via TOPICAL

## 2023-02-09 MED ORDER — SENNA 8.6 MG PO TABS
1.0000 | ORAL_TABLET | Freq: Every day | ORAL | Status: DC
Start: 2023-02-09 — End: 2023-02-11
  Administered 2023-02-10 – 2023-02-11 (×2): 8.6 mg via ORAL
  Filled 2023-02-09 (×2): qty 1

## 2023-02-09 MED ORDER — POLYETHYLENE GLYCOL 3350 17 G PO PACK
17.0000 g | PACK | Freq: Every day | ORAL | Status: DC
  Administered 2023-02-11: 17 g via ORAL
  Filled 2023-02-09 (×2): qty 1

## 2023-02-09 NOTE — Progress Notes (Signed)
 HD#3 Subjective:  Overnight Events: No acute events overnight.  Patient evaluated at bedside.  No acute complaints.  He declined ambulation with physical therapy. Will follow-up with dispo plans from social worker and physical therapy.  Objective:  Vital signs in last 24 hours: Vitals:   02/08/23 2000 02/08/23 2347 02/09/23 0400 02/09/23 0906  BP: 136/64 131/63 130/62 124/75  Pulse: 63 63 61 61  Resp: 18 17 18 17   Temp: 98 F (36.7 C) 98.2 F (36.8 C) 98 F (36.7 C) 97.9 F (36.6 C)  TempSrc: Oral Oral Oral Oral  SpO2:    91%  Weight:      Height:       Supplemental O2: Room Air SpO2: 91 %   Physical Exam:   General: NAD. CV: regular rate, and rhythm. Pulmonary: Normal effort on RA.  MSK: bilateral amputations of LE without signs of infection or open wounds Neuro: Awake and alert to person, place and time.  Will intermittently choose to not answer thesse questions No focal weakness.  Filed Weights   02/05/23 1907 02/08/23 1547 02/08/23 1915  Weight: 64.4 kg 65 kg 64.5 kg     Intake/Output Summary (Last 24 hours) at 02/09/2023 1033 Last data filed at 02/08/2023 1900 Gross per 24 hour  Intake --  Output 500 ml  Net -500 ml    Net IO Since Admission: -170.14 mL [02/09/23 1033]  Recent Labs    02/07/23 2124 02/08/23 0611 02/08/23 1204  GLUCAP 139* 105* 140*     Pertinent Labs:    Latest Ref Rng & Units 02/09/2023    5:55 AM 02/08/2023    5:23 AM 02/07/2023    6:21 AM  CBC  WBC 4.0 - 10.5 K/uL 6.4  6.0  5.8   Hemoglobin 13.0 - 17.0 g/dL 88.9  89.3  89.6   Hematocrit 39.0 - 52.0 % 32.2  31.3  30.7   Platelets 150 - 400 K/uL 205  181  143        Latest Ref Rng & Units 02/09/2023    5:55 AM 02/08/2023    5:23 AM 02/07/2023    6:21 AM  CMP  Glucose 70 - 99 mg/dL 95  892  90   BUN 8 - 23 mg/dL 26  50  45   Creatinine 0.61 - 1.24 mg/dL 4.89  2.45  3.08   Sodium 135 - 145 mmol/L 133  132  132   Potassium 3.5 - 5.1 mmol/L 3.5  3.6  3.2   Chloride 98 - 111  mmol/L 96  94  96   CO2 22 - 32 mmol/L 23  21  22    Calcium  8.9 - 10.3 mg/dL 7.6  7.0  6.9     Imaging: No results found.   Assessment/Plan:   Principal Problem:   Altered mental status Active Problems:   Hypertension, essential   Sepsis, viral (HCC)   S/P BKA (below knee amputation) unilateral, right (HCC)   ESRD on dialysis (HCC)   Type 2 diabetes mellitus with diabetic peripheral angiopathy without gangrene, with long-term current use of insulin  (HCC)   S/P AKA (above knee amputation) unilateral, left (HCC)   Uremia   Influenza due to identified novel influenza A virus with other respiratory manifestations   Encephalopathy   Acute encephalopathy   Patient Summary: [name] is a 63 y.o. male with a history of ESRD on HD TTS, PAD  s/p R BKA L AKA, T2DM who presented with confusion and  admitted for altered mental status    Acute encephalopathy, resolved Suspected Uremia, improved NAGMA + AGMA Mental status, improved.  MRI of the brain yesterday showed no signs of acute encephalopathy.  Will transition from IV thiamine  to p.o.  Medically stable for discharge pending PT evaluation.  -Continue p.o. thiamine  -Continue po dilaudid  dose to 1 mg Q6H PRN (severe) -Continue HD -Pending SNF placement  -Monitor for changes in mentation status  # Concern for Alzheimer's on MRI # Suspected Dementia Evidence of mesial temporal lobe atrophy on MRI to the brain.  Findings could be concerning for Alzheimer's. -Follow-up with neurology/geriatrics outpatient?  ESRD HD TTS On dialysis Tuesday Thursday Saturday.  Got dialyzed yesterday, stable electrolyte. -BMP   Elevated D-dimer Elevated D-dimer of 2.32, V/Q perfusion scan ordered by EDP was negative. Suspect elevation from missed HD and current infection which are improving.   PAD s/p R BKA L AKA Chronic pain. Not worsening. Able to perform ROM.  -Tylenol  and po dilaudid  1 mg Q6H PRN  -Continue home medication Crestor  10 mg,  Zetia  10 mg daily, and ASA 81 mg daily    Chronic Conditions Type II DM: continue home medication Semglee  15 units at bedtime HTN: continue home medication carvedilol  6.25 mg BID Tobacco Use: Nicotine  patch   Diet: Renal diet IVF: PO intake VTE: heparin  injection 5,000 Units Start: 02/04/23 0800 Code: Full PT/OT: None ID: none  Anticipated discharge to SNF in 2-3 days pending medical stabilization and placement.  [name], M.D.  Internal Medicine Resident, PGY-1 [name]  Pager: [phone] 10:33 AM, 02/09/2023   **Please contact the on call pager after 5 pm and on weekends at [phone].**

## 2023-02-09 NOTE — Plan of Care (Signed)
  Problem: Clinical Measurements: Goal: Respiratory complications will improve Outcome: Progressing Goal: Cardiovascular complication will be avoided Outcome: Progressing   Problem: Elimination: Goal: Will not experience complications related to urinary retention Outcome: Progressing   Problem: Skin Integrity: Goal: Risk for impaired skin integrity will decrease Outcome: Progressing

## 2023-02-09 NOTE — Progress Notes (Signed)
 Physical Therapy Treatment Patient Details Name: [name] MRN: [id number] DOB: 09/23/1960 Today's Date: 02/09/2023   History of Present Illness Pt is a 63 yr old male who presented with AMS and generalized weakness. Found at home covered in feces. Pt with sepsis 2/2 to Flu A, FTT and suspected uremia. Recent fall,found at home and complaints of stump pain.  PMH: PAD, ESRD on HD T Th Sat, DM2, CHF, HLD, HLD, R BKA, L AKA.    PT Comments  Pt resting in bed on arrival and demonstrating slow but steady progress towards acute goals. Pt continues to be limited by fatigue, global weakness, decreased cognition, and decreased activity tolerance. Pt needing mod A to come to sitting up EOB with assist to elevate trunk, square hips at EOB and scoot out closer to EOB for optimal positioning. Pt with limited tolerance for upright sitting, able to maintain briefly to donn RLE prosthesis, with max A, before lying back down in L side-lying against elevated EOB due to fatigue. Pt provided max encouragement to attempt pivot to chair to simulate w/c transfer however pt declining. Pt returning to supine at end of session with min A needed for re-positioning. Patient will benefit from continued inpatient follow up therapy, <3 hours/day to maximize functional mobility, safety and independence prior to d/c to home. Will continue to follow acutely.      If plan is discharge home, recommend the following: Assistance with cooking/housework;A lot of help with walking and/or transfers;A lot of help with bathing/dressing/bathroom;Assist for transportation;Help with stairs or ramp for entrance;Supervision due to cognitive status   Can travel by private vehicle     No  Equipment Recommendations  None recommended by PT    Recommendations for Other Services       Precautions / Restrictions Precautions Precautions: Fall Precaution Comments: R BKA, L AKA, very limited vision Restrictions Weight Bearing Restrictions Per  Provider Order: No     Mobility  Bed Mobility Overal bed mobility: Needs Assistance Bed Mobility: Supine to Sit, Sit to Supine     Supine to sit: Mod assist, Used rails, HOB elevated Sit to supine: Min assist   General bed mobility comments: up to EOB with max cues, mod A to eleavte trunk to sitting and to scoot out to EOB, pt laying down to L side-lying despite cues and encouragement to sit up and transfer to chair (R prosthesis donned), pt needing min A to return to supine and max A to reposition in bed    Transfers                   General transfer comment: max encouragement provided and placed recliner and chair to attempt pivot to chair to simulate trasnfer to w/c. pt lying back down due to fatigue    Ambulation/Gait                   Stairs             Wheelchair Mobility     Tilt Bed    Modified Rankin (Stroke Patients Only)       Balance Overall balance assessment: Needs assistance Sitting-balance support: No upper extremity supported, Feet unsupported Sitting balance-Leahy Scale: Fair Sitting balance - Comments: can sit on EOB with no UE support                                    Cognition  Arousal: Alert Behavior During Therapy: Flat affect Overall Cognitive Status: Impaired/Different from baseline Area of Impairment: Attention, Following commands, Memory, Safety/judgement, Awareness, Problem solving                   Current Attention Level: Sustained Memory: Decreased short-term memory Following Commands: Follows one step commands consistently, Follows one step commands with increased time Safety/Judgement: Decreased awareness of safety, Decreased awareness of deficits Awareness: Intellectual Problem Solving: Slow processing, Decreased initiation, Difficulty sequencing, Requires verbal cues, Requires tactile cues General Comments: can be easily aggitated and difficult to motivate        Exercises       General Comments        Pertinent Vitals/Pain Pain Assessment Pain Assessment: Faces Faces Pain Scale: Hurts a little bit Pain Location: generalized Pain Descriptors / Indicators: Grimacing Pain Intervention(s): Monitored during session, Limited activity within patient's tolerance    Home Living                          Prior Function            PT Goals (current goals can now be found in the care plan section) Acute Rehab PT Goals PT Goal Formulation: Patient unable to participate in goal setting Time For Goal Achievement: 02/20/23 Progress towards PT goals: Progressing toward goals (slowly)    Frequency    Min 1X/week      PT Plan      Co-evaluation              AM-PAC PT 6 Clicks Mobility   Outcome Measure  Help needed turning from your back to your side while in a flat bed without using bedrails?: A Lot Help needed moving from lying on your back to sitting on the side of a flat bed without using bedrails?: A Lot Help needed moving to and from a bed to a chair (including a wheelchair)?: Total Help needed standing up from a chair using your arms (e.g., wheelchair or bedside chair)?: Total Help needed to walk in hospital room?: Total Help needed climbing 3-5 steps with a railing? : Total 6 Click Score: 8    End of Session Equipment Utilized During Treatment: Other (comment) (R prosthesis) Activity Tolerance: Patient tolerated treatment well;Patient limited by fatigue Patient left: in bed;with call bell/phone within reach;with bed alarm set Nurse Communication: Mobility status PT Visit Diagnosis: Other abnormalities of gait and mobility (R26.89);Other symptoms and signs involving the nervous system (R29.898)     Time: 8894-8876 PT Time Calculation (min) (ACUTE ONLY): 18 min  Charges:    $Therapeutic Activity: 8-22 mins PT General Charges $$ ACUTE PT VISIT: 1 Visit                     Othelia Riederer R. PTA Acute Rehabilitation  Services Office: [phone]   Therisa CHRISTELLA Boor 02/09/2023, 11:43 AM

## 2023-02-09 NOTE — TOC Initial Note (Signed)
 Transition of Care [facility]) - Initial/Assessment Note    Patient Details  Name: [name] MRN: [id number] Date of Birth: [birth year]  Transition of Care [facility]) CM/SW Contact:    Almarie CHRISTELLA Goodie, LCSW Phone Number: 02/09/2023, 1:43 PM  Clinical Narrative:     CSW spoke with patient's sister, Ronal, to discuss recommendation for SNF. Mary in agreement with SNF, patient has been to [facility] in the past and would be ok with going back there, if able. If not, would prefer [facility]. CSW confirmed with sister that patient has been utilizing the scat bus for transportation to dialysis, he is usually picked up at home around 9:30/10:00. CSW completed referral and faxed out, requested [facility] to review. CSW to follow.              Expected Discharge Plan: Skilled Nursing Facility Barriers to Discharge: Continued Medical Work up, English As A Second Language Teacher, Other (must enter comment) (Droplet precautions, Dialysis patient)   Patient Goals and CMS Choice Patient states their goals for this hospitalization and ongoing recovery are:: patient unable to participate in goal setting, not fully oriented CMS Medicare.gov Compare Post Acute Care list provided to:: Patient Represenative (must comment) Choice offered to / list presented to : Sibling [place] ownership interest in [facility].provided to:: Sibling    Expected Discharge Plan and Services     Post Acute Care Choice: Skilled Nursing Facility Living arrangements for the past 2 months: Single Family Home                                      Prior Living Arrangements/Services Living arrangements for the past 2 months: Single Family Home Lives with:: Self Patient language and need for interpreter reviewed:: No Do you feel safe going back to the place where you live?: Yes      Need for Family Participation in Patient Care: Yes (Comment) Care giver support system in place?: No (comment)   Criminal  Activity/Legal Involvement Pertinent to Current Situation/Hospitalization: No - Comment as needed  Activities of Daily Living      Permission Sought/Granted Permission sought to share information with : Facility Medical Sales Representative, Family Supports Permission granted to share information with : Yes, Verbal Permission Granted  Share Information with NAME: Ronal  Permission granted to share info w AGENCY: SNF  Permission granted to share info w Relationship: Sister     Emotional Assessment   Attitude/Demeanor/Rapport: Unable to Assess Affect (typically observed): Unable to Assess Orientation: : Oriented to Self, Oriented to Place Alcohol / Substance Use: Not Applicable Psych Involvement: No (comment)  Admission diagnosis:  Altered mental status [R41.82] Uremia [N19] Encephalopathy, unspecified type [G93.40] Acute encephalopathy [G93.40] Patient Active Problem List   Diagnosis Date Noted   Acute encephalopathy 02/06/2023   Encephalopathy 02/05/2023   Uremia 02/04/2023   Influenza due to identified novel influenza A virus with other respiratory manifestations 02/04/2023   Altered mental status 02/03/2023   Neuropathy 12/22/2022   Above knee amputation of left lower extremity (HCC) 12/22/2022   Disability affecting daily living 12/22/2022   S/P AKA (above knee amputation) unilateral, left (HCC) 05/15/2022   Anemia associated with chronic renal failure 05/15/2022   Tobacco use disorder 03/04/2022   Incontinence of urine 09/23/2021   Type 2 diabetes mellitus with diabetic peripheral angiopathy without gangrene, with long-term current use of insulin  (HCC)    Non-STEMI (non-ST elevated myocardial infarction) (HCC)  05/22/2021   ESRD on dialysis [facility])    Colon polyps 03/03/2021   Mild cognitive impairment with memory loss 02/03/2021   Hx of medication noncompliance 09/30/2020   S/P BKA (below knee amputation) unilateral, right (HCC) 08/17/2018   Pulmonary nodules 02/16/2018    Diabetic neuropathy (HCC) 05/30/2014   Sepsis, viral (HCC) 09/13/2013   GERD (gastroesophageal reflux disease) 05/25/2013   Heart failure with reduced ejection fraction and diastolic dysfunction (HCC) 03/27/2013   Healthcare maintenance 02/15/2013   PVD (peripheral vascular disease) (HCC) 12/31/2011   HLD (hyperlipidemia) 03/06/2008   Hypertension, essential 03/06/2008   PCP:  Stephanie Freund, MD Pharmacy:   [place] [facility] [address] [place] [address] Phone: [phone] Fax: [phone]  [facility] [address] [place] [address] Phone: [phone] Fax: [phone]  [id number] [place], [place] - [address] AT [facility] & [address] [place] [address] Phone: [phone] Fax: [phone]     Social Drivers of Health (SDOH) Social History: SDOH Screenings   Food Insecurity: Patient Unable To Answer (02/05/2023)  Housing: Patient Declined (02/05/2023)  Transportation Needs: Patient Declined (02/05/2023)  Utilities: Patient Declined (02/05/2023)  Depression (PHQ2-9): Low Risk  (02/01/2022)  Tobacco Use: High Risk (02/02/2023)   SDOH Interventions:     Readmission Risk Interventions    08/30/2022    5:52 PM 08/20/2021    3:13 PM  Readmission Risk Prevention Plan  Transportation Screening Complete Complete  PCP or Specialist Appt within 3-5 Days Complete Complete  HRI or Home Care Consult  Complete  Social Work Consult for Recovery Care Planning/Counseling  Complete  Palliative Care Screening  Not Applicable  Medication Review [facility]) Referral to Pharmacy Complete  PCP or Specialist appointment within 3-5 days of discharge Complete   HRI or Home Care Consult Complete   SW Recovery Care/Counseling Consult Complete   Palliative Care Screening Not Applicable   Skilled Nursing Facility Not Applicable

## 2023-02-09 NOTE — Plan of Care (Signed)
  Problem: Education: Goal: Knowledge of General Education information will improve Description: Including pain rating scale, medication(s)/side effects and non-pharmacologic comfort measures Outcome: Progressing   Problem: Clinical Measurements: Goal: Ability to maintain clinical measurements within normal limits will improve Outcome: Progressing   Problem: Clinical Measurements: Goal: Will remain free from infection Outcome: Progressing   Problem: Clinical Measurements: Goal: Respiratory complications will improve Outcome: Progressing   Problem: Clinical Measurements: Goal: Cardiovascular complication will be avoided Outcome: Progressing

## 2023-02-09 NOTE — Progress Notes (Signed)
 [facility] Progress Note   Subjective:    Seen and examined patient at bedside. Tolerated yesterday's HD with minimal UF. Blood pressures remain stable. Continues to deny any acute issues. Should be able to be discharged soon. Awaiting PT evaluation. Next HD 2/6 if patient remains inpatient.  Objective Vitals:   02/08/23 2000 02/08/23 2347 02/09/23 0400 02/09/23 0906  BP: 136/64 131/63 130/62 124/75  Pulse: 63 63 61 61  Resp: 18 17 18 17   Temp: 98 F (36.7 C) 98.2 F (36.8 C) 98 F (36.7 C) 97.9 F (36.6 C)  TempSrc: Oral Oral Oral Oral  SpO2:    91%  Weight:      Height:       Physical Exam General: Chronically ill-appearing; NAD Heart: S1 and S2; No MRGs Lungs:Clear throughout. Breathing unlabored Abdomen: Soft and non-tender Extremities:L AKA; R BKA. No edema with stumps Dialysis Access: [facility]   Filed Weights   02/05/23 1907 02/08/23 1547 02/08/23 1915  Weight: 64.4 kg 65 kg 64.5 kg    Intake/Output Summary (Last 24 hours) at 02/09/2023 1120 Last data filed at 02/08/2023 1900 Gross per 24 hour  Intake --  Output 500 ml  Net -500 ml    Additional Objective Labs: Basic Metabolic Panel: Recent Labs  Lab 02/07/23 0621 02/08/23 0523 02/09/23 0555  NA 132* 132* 133*  K 3.2* 3.6 3.5  CL 96* 94* 96*  CO2 22 21* 23  GLUCOSE 90 107* 95  BUN 45* 50* 26*  CREATININE 6.91* 7.54* 5.10*  CALCIUM  6.9* 7.0* 7.6*  PHOS 4.7* 5.7* 4.2   Liver Function Tests: Recent Labs  Lab 02/02/23 1210 02/03/23 1643 02/05/23 0651 02/07/23 0621 02/08/23 0523 02/09/23 0555  AST 32 33  --   --   --   --   ALT 18 22  --   --   --   --   ALKPHOS 58 57  --   --   --   --   BILITOT 1.0 0.7  --   --   --   --   PROT 6.7 7.4  --   --   --   --   ALBUMIN  3.0* 3.5   < > 2.7* 2.8* 2.9*   < > = values in this interval not displayed.   No results for input(s): LIPASE, AMYLASE in the last 168 hours. CBC: Recent Labs  Lab 02/04/23 0508 02/06/23 0710 02/07/23 0621  02/08/23 0523 02/09/23 0555  WBC 8.6 6.8 5.8 6.0 6.4  HGB 11.6* 11.0* 10.3* 10.6* 11.0*  HCT 34.8* 32.3* 30.7* 31.3* 32.2*  MCV 91.3 89.0 89.0 89.2 89.0  PLT 134* 114* 143* 181 205   Blood Culture    Component Value Date/Time   SDES URINE, CLEAN CATCH 02/08/2023 1007   SPECREQUEST  02/08/2023 1007    Normal Performed at [facility], [address], [place], [place] [address]    CULT MULTIPLE SPECIES PRESENT, SUGGEST RECOLLECTION (A) 02/08/2023 1007   REPTSTATUS 02/09/2023 FINAL 02/08/2023 1007    Cardiac Enzymes: No results for input(s): CKTOTAL, CKMB, CKMBINDEX, TROPONINI in the last 168 hours. CBG: Recent Labs  Lab 02/07/23 1205 02/07/23 1648 02/07/23 2124 02/08/23 0611 02/08/23 1204  GLUCAP 78 269* 139* 105* 140*   Iron Studies: No results for input(s): IRON, TIBC, TRANSFERRIN, FERRITIN in the last 72 hours. Lab Results  Component Value Date   INR 1.1 02/03/2023   INR 1.3 (H) 08/25/2022   INR 1.4 (H) 08/11/2021  Studies/Results: MR BRAIN WO CONTRAST Result Date: 02/08/2023 CLINICAL DATA:  63 year old male with altered mental status. EXAM: MRI HEAD WITHOUT CONTRAST TECHNIQUE: Multiplanar, multiecho pulse sequences of the brain and surrounding structures were obtained without intravenous contrast. COMPARISON:  Head CT 02/03/2023 and earlier. FINDINGS: Brain: No restricted diffusion to suggest acute infarction. No midline shift, mass effect, evidence of mass lesion, ventriculomegaly, extra-axial collection or acute intracranial hemorrhage. Cervicomedullary junction and pituitary are within normal limits. In general cerebral volume seems normal for age but there is evidence of disproportionate mesial temporal lobe atrophy on coronal image 17. No cortical encephalomalacia or chronic cerebral blood products identified. But moderate nonspecific cerebral white matter T2 and FLAIR hyperintensity which is mostly periventricular. Multiple chronic lacunar  infarcts in the right deep gray matter nuclei including the basal ganglia and thalamus on that side (series 5, image 15). Brainstem and cerebellum appear negative. Vascular: Major intracranial vascular flow voids are preserved. Skull and upper cervical spine: Partially visible cervical spine degeneration. Background bone marrow signal within normal limits. Sinuses/Orbits: Negative orbits. Mild bilateral paranasal sinus mucosal thickening. Mild left mastoid air cell fluid. Right mastoids are clear. Other: Negative visible nasopharynx. Grossly normal other visible internal auditory structures. Negative visible scalp and face. IMPRESSION: 1. No acute intracranial abnormality. 2. 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 Moderate other nonspecific cerebral white matter signal changes therefore might also be small vessel disease related. 3. Evidence of disproportionate mesial temporal lobe atrophy raising the possibility of neurodegenerative disease, dementia. Cerebral Atrophy (ICD10-G31.9). Electronically Signed   By: VEAR Hurst M.D.   On: 02/08/2023 05:44    Medications:  albumin  human      (feeding supplement) PROSource Plus  30 mL Oral BID BM   acetaminophen   1,000 mg Oral Q8H   aspirin  EC  81 mg Oral Daily   carvedilol   6.25 mg Oral BID WC   Chlorhexidine  Gluconate Cloth  6 each Topical Q0600   doxercalciferol   5 mcg Intravenous Q T,Th,Sa-HD   ezetimibe   10 mg Oral Daily   heparin   5,000 Units Subcutaneous Q8H   insulin  glargine-yfgn  15 Units Subcutaneous QHS   lidocaine   1 patch Transdermal Q24H   nicotine   7 mg Transdermal Daily   rosuvastatin   10 mg Oral Daily   thiamine   200 mg Oral Daily    Dialysis Orders: [place] T,Th,S 4 hrs 180NRe 500/800 66 kg 2.0 K /2.0 Ca TDC - No Heparin   - Hectorol  5 mcg IV three times per week - Mircera 30 mcg IV q 4 weeks (last dose 01/25/2023)  Assessment/Plan: AMS-Currently resolved. likely some degree of uremia but has missed more  dialysis in the past and this has not happened.  Could be multifactorial with influence of playing a big role but also progressive underlying dementia.  Influenza A-per primary ESRD -received dialysis on 1/31 and 2/1 last week.  Maintain TTS schedule.  Dialysis with some low flows causing Cathflo to be used. Didn't hear of any issues yesterday. K+ stable. Next HD 2/6 if patient remains inpatient. Hypertension/volume - Euvolemic on exam.  Maintain volume status with dialysis Anemia  - hemoglobin at goal.  ESA if needed Metabolic bone disease -corrected calcium  on lower side and phos at goal. On Hectorol . Nutrition - Albumin  low side. Protein supplements.  Dispo - Okay for discharge from a renal standpoint. Appears waiting on PT evaluation per Primary.  Charmaine Piety, NP [facility] 02/09/2023,11:20 AM  LOS: 3 days

## 2023-02-09 NOTE — Progress Notes (Signed)
 Occupational Therapy Treatment Patient Details Name: [name] MRN: [id number] DOB: 1960/04/22 Today's Date: 02/09/2023   History of present illness Pt is a 63 yr old male who presented with AMS and generalized weakness. Found at home covered in feces. Pt with sepsis 2/2 to Flu A, FTT and suspected uremia. Recent fall,found at home and complaints of stump pain.  PMH: PAD, ESRD on HD T Th Sat, DM2, CHF, HLD, HLD, R BKA, L AKA.   OT comments  After 22 min, pt accomplished rolling to his L side. Attempted to mobilize out of bed, however pt appears somewhat self limiting and would begin whistling or just close his eyes, declining to move further. Patient will benefit from continued inpatient follow up therapy, <3 hours/day. Pt uses his R prosthetic leg to stand pivot to a chair when he is willing to participate. Encourage mobility with nursing staff. Acute OT will follow.       If plan is discharge home, recommend the following:  Two people to help with walking and/or transfers;A lot of help with bathing/dressing/bathroom;Assistance with cooking/housework;Direct supervision/assist for medications management;Direct supervision/assist for financial management;Assist for transportation   Equipment Recommendations  None recommended by OT    Recommendations for Other Services      Precautions / Restrictions Precautions Precautions: Fall Precaution Comments: R BKA, L AKA, very limited vision Restrictions Weight Bearing Restrictions Per Provider Order: No       Mobility Bed Mobility Overal bed mobility: Needs Assistance Bed Mobility: Rolling Rolling: Supervision, Used rails         General bed mobility comments: would not move further    Transfers                   General transfer comment: max encouragement provided and placed recliner and chair to attempt pivot to chair to simulate trasnfer to w/c. pt lying back down due to fatigue     Balance                                            ADL either performed or assessed with clinical judgement   ADL                                         General ADL Comments: not participatin gin any ADL ; Able oto feed himself    Extremity/Trunk Assessment Upper Extremity Assessment Upper Extremity Assessment:  (B hand pain; intrinsic weakness)   Lower Extremity Assessment Lower Extremity Assessment: Defer to PT evaluation        Vision       Perception     Praxis      Cognition Arousal: Alert Behavior During Therapy: Flat affect Overall Cognitive Status: Impaired/Different from baseline Area of Impairment: Attention, Following commands, Memory, Safety/judgement, Awareness, Problem solving                 Orientation Level: Disoriented to, Time Current Attention Level: Sustained Memory: Decreased short-term memory Following Commands: Follows one step commands with increased time Safety/Judgement: Decreased awareness of safety, Decreased awareness of deficits Awareness: Intellectual Problem Solving: Slow processing, Decreased initiation, Difficulty sequencing, Requires verbal cues, Requires tactile cues General Comments: Begins to whistle mid sentence; will close eyes adn stop movment  Exercises      Shoulder Instructions       General Comments      Pertinent Vitals/ Pain       Pain Assessment Pain Assessment: Faces Faces Pain Scale: Hurts a little bit Pain Location: generalized (B hands) Pain Descriptors / Indicators: Grimacing Pain Intervention(s): Limited activity within patient's tolerance  Home Living                                          Prior Functioning/Environment              Frequency  Min 1X/week        Progress Toward Goals  OT Goals(current goals can now be found in the care plan section)  Progress towards OT goals: Progressing toward goals  Acute Rehab OT Goals Patient Stated Goal:  wants coffee adn a blanket OT Goal Formulation: Patient unable to participate in goal setting Time For Goal Achievement: 02/19/23 Potential to Achieve Goals: Fair ADL Goals Pt Will Perform Lower Body Bathing: with min assist;bed level Pt Will Perform Lower Body Dressing: with min assist;bed level Pt Will Transfer to Toilet: with mod assist;bedside commode Additional ADL Goal #1: Bed mobility with min A in preparation for ADL  Plan      Co-evaluation                 AM-PAC OT 6 Clicks Daily Activity     Outcome Measure   Help from another person eating meals?: None Help from another person taking care of personal grooming?: A Little Help from another person toileting, which includes using toliet, bedpan, or urinal?: A Lot Help from another person bathing (including washing, rinsing, drying)?: A Lot Help from another person to put on and taking off regular upper body clothing?: A Lot Help from another person to put on and taking off regular lower body clothing?: A Lot 6 Click Score: 15    End of Session    OT Visit Diagnosis: Other abnormalities of gait and mobility (R26.89);Muscle weakness (generalized) (M62.81);Pain;Other symptoms and signs involving cognitive function Pain - Right/Left:  (B) Pain - part of body: Hand   Activity Tolerance Other (comment);Patient limited by fatigue (self limiting)   Patient Left in bed;with call bell/phone within reach;with bed alarm set   Nurse Communication Mobility status        Time: 1430-1452 OT Time Calculation (min): 22 min  Charges: OT General Charges $OT Visit: 1 Visit OT Treatments $Self Care/Home Management : 8-22 mins  Kreg Sink, OT/L   Acute OT Clinical Specialist Acute Rehabilitation Services Pager [phone] Office [phone]   [facility] 02/09/2023, 3:08 PM

## 2023-02-09 NOTE — NC FL2 (Addendum)
 [place]  MEDICAID FL2 LEVEL OF CARE FORM     IDENTIFICATION  Patient Name: [name] Birthdate: 06/02/1960 Sex: male Admission Date (Current Location): 02/03/2023  [facility] and [place] Number:  Chiropodist and Address:  [facility] [place], [address] [place], [place] [address]      Provider Number: [id number]  Attending Physician Name and Address:  Forest Coy, MD  Relative Name and Phone Number:       Current Level of Care: Hospital Recommended Level of Care: Skilled Nursing Facility Prior Approval Number:    Date Approved/Denied:   PASRR Number: [id number]  Discharge Plan: SNF    Current Diagnoses: Patient Active Problem List   Diagnosis Date Noted   Acute encephalopathy 02/06/2023   Encephalopathy 02/05/2023   Uremia 02/04/2023   Influenza due to identified novel influenza A virus with other respiratory manifestations 02/04/2023   Altered mental status 02/03/2023   Neuropathy 12/22/2022   Above knee amputation of left lower extremity (HCC) 12/22/2022   Disability affecting daily living 12/22/2022   S/P AKA (above knee amputation) unilateral, left (HCC) 05/15/2022   Anemia associated with chronic renal failure 05/15/2022   Tobacco use disorder 03/04/2022   Incontinence of urine 09/23/2021   Type 2 diabetes mellitus with diabetic peripheral angiopathy without gangrene, with long-term current use of insulin  (HCC)    Non-STEMI (non-ST elevated myocardial infarction) (HCC) 05/22/2021   ESRD on dialysis (HCC)    Colon polyps 03/03/2021   Mild cognitive impairment with memory loss 02/03/2021   Hx of medication noncompliance 09/30/2020   S/P BKA (below knee amputation) unilateral, right (HCC) 08/17/2018   Pulmonary nodules 02/16/2018   Diabetic neuropathy (HCC) 05/30/2014   Sepsis, viral (HCC) 09/13/2013   GERD (gastroesophageal reflux disease) 05/25/2013   Heart failure with reduced ejection fraction and diastolic dysfunction  (HCC) 03/27/2013   Healthcare maintenance 02/15/2013   PVD (peripheral vascular disease) (HCC) 12/31/2011   HLD (hyperlipidemia) 03/06/2008   Hypertension, essential 03/06/2008    Orientation RESPIRATION BLADDER Height & Weight     Place, Self  Normal Continent Weight: 142 lb 3.2 oz (64.5 kg) Height:  4' 7 (139.7 cm) (Pt's stated height.  RN did not measure pt.)  BEHAVIORAL SYMPTOMS/MOOD NEUROLOGICAL BOWEL NUTRITION STATUS      Continent Diet (Renal with Fluid Restriction)  AMBULATORY STATUS COMMUNICATION OF NEEDS Skin   Extensive Assist Verbally Normal                       Personal Care Assistance Level of Assistance  Feeding, Bathing, Dressing Bathing Assistance: Limited assistance Feeding assistance: Independent Dressing Assistance: Limited assistance     Functional Limitations Info             SPECIAL CARE FACTORS FREQUENCY  PT (By licensed PT), OT (By licensed OT)     PT Frequency: 5x/week OT Frequency: 5x/week            Contractures      Additional Factors Info  Code Status, Allergies Code Status Info: Full Allergies Info: Benicar  (Olmesartan )           Current Medications (02/09/2023):  This is the current hospital active medication list Current Facility-Administered Medications  Medication Dose Route Frequency Provider Last Rate Last Admin   (feeding supplement) PROSource Plus liquid 30 mL  30 mL Oral BID BM Brown, Rita Harbison, NP   30 mL at 02/09/23 1323   acetaminophen  (TYLENOL ) tablet 1,000 mg  1,000 mg Oral Q8H Zheng, Michael, DO   1,000 mg at 02/09/23 1323   albumin  human 25 % solution 25 g  25 g Intravenous Daily PRN Macel Jayson PARAS, MD       aspirin  EC tablet 81 mg  81 mg Oral Daily Nooruddin, Saad, MD   81 mg at 02/09/23 0913   benzonatate  (TESSALON ) capsule 100 mg  100 mg Oral TID PRN Zheng, Michael, DO   100 mg at 02/09/23 0227   carvedilol  (COREG ) tablet 6.25 mg  6.25 mg Oral BID WC Nooruddin, Saad, MD   6.25 mg at 02/09/23  0913   [START ON 02/10/2023] Chlorhexidine  Gluconate Cloth 2 % PADS 6 each  6 each Topical Q0600 Anderson, Courtney E, NP       doxercalciferol  (HECTOROL ) injection 5 mcg  5 mcg Intravenous Q T,Th,Sa-HD Delores Ricka Maidens, NP   5 mcg at 02/08/23 1805   ezetimibe  (ZETIA ) tablet 10 mg  10 mg Oral Daily Nooruddin, Saad, MD   10 mg at 02/09/23 9085   heparin  injection 5,000 Units  5,000 Units Subcutaneous Q8H Nooruddin, Saad, MD   5,000 Units at 02/09/23 1324   HYDROmorphone  (DILAUDID ) tablet 1 mg  1 mg Oral Q6H PRN Zheng, Michael, DO   1 mg at 02/08/23 9640   insulin  glargine-yfgn (SEMGLEE ) injection 15 Units  15 Units Subcutaneous QHS Nooruddin, Saad, MD   15 Units at 02/08/23 2121   lidocaine  (LIDODERM ) 5 % 1 patch  1 patch Transdermal Q24H Zheng, Michael, DO   1 patch at 02/09/23 1123   loperamide  (IMODIUM ) capsule 2 mg  2 mg Oral PRN Zheng, Michael, DO       nicotine  (NICODERM CQ  - dosed in mg/24 hr) patch 7 mg  7 mg Transdermal Daily Nooruddin, Saad, MD   7 mg at 02/09/23 9083   polyethylene glycol (MIRALAX  / GLYCOLAX ) packet 17 g  17 g Oral Daily Zheng, Michael, DO       rosuvastatin  (CRESTOR ) tablet 10 mg  10 mg Oral Daily Nooruddin, Saad, MD   10 mg at 02/09/23 9085   senna (SENOKOT) tablet 8.6 mg  1 tablet Oral Daily Zheng, Michael, DO       thiamine  (VITAMIN B1) tablet 200 mg  200 mg Oral Daily Zheng, Michael, DO   200 mg at 02/09/23 9085     Discharge Medications: Please see discharge summary for a list of discharge medications.  Relevant Imaging Results:  Relevant Lab Results:   Additional Information SSN [id number]  Antion ELINORE Louder, Student-Social Work

## 2023-02-10 ENCOUNTER — Inpatient Hospital Stay (HOSPITAL_COMMUNITY): Payer: 59

## 2023-02-10 ENCOUNTER — Encounter (HOSPITAL_COMMUNITY): Payer: Self-pay | Admitting: Internal Medicine

## 2023-02-10 ENCOUNTER — Ambulatory Visit: Payer: Medicaid Other | Admitting: Vascular Surgery

## 2023-02-10 ENCOUNTER — Other Ambulatory Visit: Payer: Self-pay

## 2023-02-10 DIAGNOSIS — J09X2 Influenza due to identified novel influenza A virus with other respiratory manifestations: Secondary | ICD-10-CM | POA: Diagnosis not present

## 2023-02-10 DIAGNOSIS — Z992 Dependence on renal dialysis: Secondary | ICD-10-CM | POA: Diagnosis not present

## 2023-02-10 DIAGNOSIS — N186 End stage renal disease: Secondary | ICD-10-CM | POA: Diagnosis not present

## 2023-02-10 DIAGNOSIS — G9340 Encephalopathy, unspecified: Secondary | ICD-10-CM | POA: Diagnosis not present

## 2023-02-10 LAB — GLUCOSE, CAPILLARY
Glucose-Capillary: 130 mg/dL — ABNORMAL HIGH (ref 70–99)
Glucose-Capillary: 91 mg/dL (ref 70–99)
Glucose-Capillary: 94 mg/dL (ref 70–99)
Glucose-Capillary: 113 mg/dL — ABNORMAL HIGH (ref 70–99)

## 2023-02-10 LAB — RENAL FUNCTION PANEL
Albumin: 3 g/dL — ABNORMAL LOW (ref 3.5–5.0)
Anion gap: 12 (ref 5–15)
BUN: 41 mg/dL — ABNORMAL HIGH (ref 8–23)
CO2: 25 mmol/L (ref 22–32)
Calcium: 7.9 mg/dL — ABNORMAL LOW (ref 8.9–10.3)
Chloride: 95 mmol/L — ABNORMAL LOW (ref 98–111)
Creatinine, Ser: 6.56 mg/dL — ABNORMAL HIGH (ref 0.61–1.24)
GFR, Estimated: 9 mL/min — ABNORMAL LOW (ref >60)
Glucose, Bld: 160 mg/dL — ABNORMAL HIGH (ref 70–99)
Phosphorus: 5.8 mg/dL — ABNORMAL HIGH (ref 2.5–4.6)
Potassium: 3.6 mmol/L (ref 3.5–5.1)
Sodium: 132 mmol/L — ABNORMAL LOW (ref 135–145)

## 2023-02-10 LAB — CBC
HCT: 32.5 % — ABNORMAL LOW (ref 39.0–52.0)
Hemoglobin: 11.1 g/dL — ABNORMAL LOW (ref 13.0–17.0)
MCH: 30.3 pg (ref 26.0–34.0)
MCHC: 34.2 g/dL (ref 30.0–36.0)
MCV: 88.8 fL (ref 80.0–100.0)
Platelets: 247 10*3/uL (ref 150–400)
RBC: 3.66 MIL/uL — ABNORMAL LOW (ref 4.22–5.81)
RDW: 13 % (ref 11.5–15.5)
WBC: 5.1 10*3/uL (ref 4.0–10.5)
nRBC: 0 % (ref 0.0–0.2)

## 2023-02-10 MED ORDER — HEPARIN SODIUM (PORCINE) 1000 UNIT/ML IJ SOLN
INTRAMUSCULAR | Status: AC
  Administered 2023-02-10: 3200 [IU]
  Filled 2023-02-10: qty 1

## 2023-02-10 NOTE — Progress Notes (Signed)
 [facility] Progress Note   Subjective:    Seen and examined patient on the HD unit and about to get started with treatment. Denies any acute complaints. BP 147/65. UFG set 524ml-1L as tolerated. Noted he declined ambulation with PT yesterday. Appears plan is for him to go to a SNF.  Objective Vitals:   02/10/23 0017 02/10/23 0438 02/10/23 0800 02/10/23 0906  BP: 139/68 (!) 146/71 131/65 (!) 147/65  Pulse: 60 (!) 58 (!) 58 (!) 59  Resp: 18 16 17 20   Temp: 97.6 F (36.4 C) 97.8 F (36.6 C) 97.7 F (36.5 C) 98 F (36.7 C)  TempSrc: Axillary Oral Axillary   SpO2: 100% 99% 97% 97%  Weight:    63.6 kg  Height:       Physical Exam General: Chronically ill-appearing; NAD Heart: S1 and S2; No MRGs Lungs:Clear throughout. Breathing unlabored Abdomen: Soft and non-tender Extremities:L AKA; R BKA. No edema with stumps Dialysis Access: [facility]  Filed Weights   02/08/23 1547 02/08/23 1915 02/10/23 0906  Weight: 65 kg 64.5 kg 63.6 kg    Intake/Output Summary (Last 24 hours) at 02/10/2023 0924 Last data filed at 02/10/2023 0730 Gross per 24 hour  Intake 560 ml  Output 350 ml  Net 210 ml    Additional Objective Labs: Basic Metabolic Panel: Recent Labs  Lab 02/08/23 0523 02/09/23 0555 02/10/23 0804  NA 132* 133* 132*  K 3.6 3.5 3.6  CL 94* 96* 95*  CO2 21* 23 25  GLUCOSE 107* 95 160*  BUN 50* 26* 41*  CREATININE 7.54* 5.10* 6.56*  CALCIUM  7.0* 7.6* 7.9*  PHOS 5.7* 4.2 5.8*   Liver Function Tests: Recent Labs  Lab 02/03/23 1643 02/05/23 0651 02/08/23 0523 02/09/23 0555 02/10/23 0804  AST 33  --   --   --   --   ALT 22  --   --   --   --   ALKPHOS 57  --   --   --   --   BILITOT 0.7  --   --   --   --   PROT 7.4  --   --   --   --   ALBUMIN  3.5   < > 2.8* 2.9* 3.0*   < > = values in this interval not displayed.   No results for input(s): LIPASE, AMYLASE in the last 168 hours. CBC: Recent Labs  Lab 02/06/23 0710 02/07/23 0621 02/08/23 0523  02/09/23 0555 02/10/23 0804  WBC 6.8 5.8 6.0 6.4 5.1  HGB 11.0* 10.3* 10.6* 11.0* 11.1*  HCT 32.3* 30.7* 31.3* 32.2* 32.5*  MCV 89.0 89.0 89.2 89.0 88.8  PLT 114* 143* 181 205 247   Blood Culture    Component Value Date/Time   SDES URINE, CLEAN CATCH 02/08/2023 1007   SPECREQUEST  02/08/2023 1007    Normal Performed at [facility], [address] [place], [place] [address]    CULT MULTIPLE SPECIES PRESENT, SUGGEST RECOLLECTION (A) 02/08/2023 1007   REPTSTATUS 02/09/2023 FINAL 02/08/2023 1007    Cardiac Enzymes: No results for input(s): CKTOTAL, CKMB, CKMBINDEX, TROPONINI in the last 168 hours. CBG: Recent Labs  Lab 02/08/23 1204 02/09/23 1229 02/09/23 1627 02/09/23 2139 02/10/23 0638  GLUCAP 140* 114* 136* 178* 130*   Iron Studies: No results for input(s): IRON, TIBC, TRANSFERRIN, FERRITIN in the last 72 hours. Lab Results  Component Value Date   INR 1.1 02/03/2023   INR 1.3 (H) 08/25/2022   INR 1.4 (H)  08/11/2021   Studies/Results: No results found.  Medications:  albumin  human      (feeding supplement) PROSource Plus  30 mL Oral BID BM   acetaminophen   1,000 mg Oral Q8H   aspirin  EC  81 mg Oral Daily   carvedilol   6.25 mg Oral BID WC   Chlorhexidine  Gluconate Cloth  6 each Topical Q0600   doxercalciferol   5 mcg Intravenous Q T,Th,Sa-HD   ezetimibe   10 mg Oral Daily   heparin   5,000 Units Subcutaneous Q8H   insulin  glargine-yfgn  15 Units Subcutaneous QHS   lidocaine   1 patch Transdermal Q24H   nicotine   7 mg Transdermal Daily   polyethylene glycol  17 g Oral Daily   rosuvastatin   10 mg Oral Daily   senna  1 tablet Oral Daily   thiamine   200 mg Oral Daily    Dialysis Orders: [place] T,Th,S 4 hrs 180NRe 500/800 66 kg 2.0 K /2.0 Ca TDC - No Heparin   - Hectorol  5 mcg IV three times per week - Mircera 30 mcg IV q 4 weeks (last dose 01/25/2023)  Assessment/Plan: AMS-Currently resolved. likely some degree of uremia but has missed  more dialysis in the past and this has not happened.  Could be multifactorial with influence of playing a big role but also progressive underlying dementia.  Influenza A-per primary ESRD -received dialysis on 1/31 and 2/1 last week.  Maintain TTS schedule.  Dialysis with some low flows causing Cathflo to be used. Didn't hear of any issues 2/4. K+ stable. On HD. Hypertension/volume - Euvolemic on exam.  Maintain volume status with dialysis Anemia  - hemoglobin at goal.  ESA if needed Metabolic bone disease -corrected calcium  on lower side and phos at goal. On Hectorol . Nutrition - Albumin  low side. Protein supplements.  Dispo - Okay for discharge from a renal standpoint. Unfortunately, he declined ambulation with PT yesterday. Plan for patient to go to SNF.  Charmaine Piety, NP [facility] 02/10/2023,9:24 AM  LOS: 4 days

## 2023-02-10 NOTE — Plan of Care (Signed)
  Problem: Education: Goal: Knowledge of General Education information will improve Description: Including pain rating scale, medication(s)/side effects and non-pharmacologic comfort measures Outcome: Progressing   Problem: Activity: Goal: Risk for activity intolerance will decrease Outcome: Progressing   Problem: Nutrition: Goal: Adequate nutrition will be maintained Outcome: Progressing   Problem: Elimination: Goal: Will not experience complications related to urinary retention Outcome: Progressing   Problem: Skin Integrity: Goal: Risk for impaired skin integrity will decrease Outcome: Progressing

## 2023-02-10 NOTE — Progress Notes (Signed)
Patient back to the unit.

## 2023-02-10 NOTE — Progress Notes (Signed)
Patient off the unit for HD

## 2023-02-10 NOTE — Progress Notes (Signed)
 HD#4 Subjective:  Overnight Events: No acute events overnight.  Patient evaluated at bedside.  No acute complaints. Scheduled for dialysis today. Medically stable for discharge pending SNF placement. Objective:  Vital signs in last 24 hours: Vitals:   02/10/23 0930 02/10/23 1000 02/10/23 1030 02/10/23 1108  BP: 91/80 (!) 142/65 (!) 87/55 (!) 118/56  Pulse: (!) 58 (!) 59 63 60  Resp: (!) 21 17 20 19   Temp:      TempSrc:      SpO2: 100% 99% 93% 99%  Weight:      Height:       Supplemental O2: Room Air SpO2: 99 %   Physical Exam:   General: NAD. CV: regular rate, and rhythm. Pulmonary: Normal effort on RA.  MSK: bilateral amputations of LE without signs of infection or open wounds Neuro: Awake and alert to person, place and time.  Will intermittently choose to not answer thesse questions No focal weakness.  Filed Weights   02/08/23 1547 02/08/23 1915 02/10/23 0906  Weight: 65 kg 64.5 kg 63.6 kg     Intake/Output Summary (Last 24 hours) at 02/10/2023 1142 Last data filed at 02/10/2023 0730 Gross per 24 hour  Intake 460 ml  Output 350 ml  Net 110 ml    Net IO Since Admission: 89.86 mL [02/10/23 1142]  Recent Labs    02/09/23 1627 02/09/23 2139 02/10/23 0638  GLUCAP 136* 178* 130*     Pertinent Labs:    Latest Ref Rng & Units 02/10/2023    8:04 AM 02/09/2023    5:55 AM 02/08/2023    5:23 AM  CBC  WBC 4.0 - 10.5 K/uL 5.1  6.4  6.0   Hemoglobin 13.0 - 17.0 g/dL 88.8  88.9  89.3   Hematocrit 39.0 - 52.0 % 32.5  32.2  31.3   Platelets 150 - 400 K/uL 247  205  181        Latest Ref Rng & Units 02/10/2023    8:04 AM 02/09/2023    5:55 AM 02/08/2023    5:23 AM  CMP  Glucose 70 - 99 mg/dL 839  95  892   BUN 8 - 23 mg/dL 41  26  50   Creatinine 0.61 - 1.24 mg/dL 3.43  4.89  2.45   Sodium 135 - 145 mmol/L 132  133  132   Potassium 3.5 - 5.1 mmol/L 3.6  3.5  3.6   Chloride 98 - 111 mmol/L 95  96  94   CO2 22 - 32 mmol/L 25  23  21    Calcium  8.9 - 10.3 mg/dL 7.9   7.6  7.0     Imaging: No results found.   Assessment/Plan:   Principal Problem:   Altered mental status Active Problems:   Hypertension, essential   Sepsis, viral (HCC)   S/P BKA (below knee amputation) unilateral, right (HCC)   ESRD on dialysis (HCC)   Type 2 diabetes mellitus with diabetic peripheral angiopathy without gangrene, with long-term current use of insulin  (HCC)   S/P AKA (above knee amputation) unilateral, left (HCC)   Uremia   Influenza due to identified novel influenza A virus with other respiratory manifestations   Encephalopathy   Acute encephalopathy   Patient Summary: [name] is a 63 y.o. male with a history of ESRD on HD TTS, PAD  s/p R BKA L AKA, T2DM who presented with confusion and admitted for altered mental status    Acute encephalopathy, resolved Suspected Uremia,  improved NAGMA + AGMA Mental status, improved.  MRI of the brain yesterday showed no signs of acute encephalopathy.  He is medically stable for discharge pending SNF placement.  -Continue p.o. thiamine  -Continue p.o. Dilaudid  1 mg for severe pain. -Continue HD -Pending SNF placement  -Monitor for changes in mentation status  # Concern for Alzheimer's on MRI # Suspected Dementia Evidence of mesial temporal lobe atrophy on MRI to the brain.  Findings could be concerning for Alzheimer's. -Follow-up with neurology/geriatrics outpatient?  ESRD HD TTS On dialysis Tuesday Thursday Saturday.  Scheduled for dialysis today, stable electrolytes. -BMP   Elevated D-dimer Elevated D-dimer of 2.32, V/Q perfusion scan ordered by EDP was negative. Suspect elevation from missed HD and current infection which are improving.   PAD s/p R BKA L AKA Chronic pain. Not worsening. Able to perform ROM.  -Tylenol  and po dilaudid  1 mg Q6H PRN  -Continue home medication Crestor  10 mg, Zetia  10 mg daily, and ASA 81 mg daily    Chronic Conditions Type II DM: continue home medication Semglee  15 units at  bedtime HTN: continue home medication carvedilol  6.25 mg BID Tobacco Use: Nicotine  patch   Diet: Renal diet IVF: PO intake VTE: heparin  injection 5,000 Units Start: 02/04/23 0800 Code: Full PT/OT: None ID: none  Anticipated discharge to SNF in 2-3 days pending medical stabilization and placement.  [name], M.D.  Internal Medicine Resident, PGY-1 [name]  Pager: [phone] 11:42 AM, 02/10/2023   **Please contact the on call pager after 5 pm and on weekends at [phone].**

## 2023-02-10 NOTE — Plan of Care (Signed)

## 2023-02-10 NOTE — Procedures (Signed)
 HD Note:  Some information was entered later than the data was gathered due to patient care needs. The stated time with the data is accurate.  Received patient in bed to unit.   Alert and oriented.   Informed consent signed and in chart.   Access used: Right upper chest HD catheter Access issues: None  Patient tolerated treatment well.  Patient did have a decline in BP.  It only required lowering the goal to within the ordered limits to ensure enough BP support.  TX duration: 3 hours  Alert, without acute distress.  Total UF removed: 500 ml  Hand-off given to patient's nurse.   Transported back to the room   Trevor Wilkie L. Lenon, RN Kidney Dialysis Unit.

## 2023-02-11 DIAGNOSIS — R404 Transient alteration of awareness: Secondary | ICD-10-CM | POA: Diagnosis not present

## 2023-02-11 DIAGNOSIS — N2581 Secondary hyperparathyroidism of renal origin: Secondary | ICD-10-CM | POA: Diagnosis not present

## 2023-02-11 DIAGNOSIS — Z95828 Presence of other vascular implants and grafts: Secondary | ICD-10-CM | POA: Diagnosis not present

## 2023-02-11 DIAGNOSIS — D509 Iron deficiency anemia, unspecified: Secondary | ICD-10-CM | POA: Diagnosis not present

## 2023-02-11 DIAGNOSIS — R531 Weakness: Secondary | ICD-10-CM | POA: Diagnosis not present

## 2023-02-11 DIAGNOSIS — I2510 Atherosclerotic heart disease of native coronary artery without angina pectoris: Secondary | ICD-10-CM | POA: Diagnosis not present

## 2023-02-11 DIAGNOSIS — I120 Hypertensive chronic kidney disease with stage 5 chronic kidney disease or end stage renal disease: Secondary | ICD-10-CM | POA: Diagnosis not present

## 2023-02-11 DIAGNOSIS — E785 Hyperlipidemia, unspecified: Secondary | ICD-10-CM | POA: Diagnosis not present

## 2023-02-11 DIAGNOSIS — E876 Hypokalemia: Secondary | ICD-10-CM | POA: Diagnosis not present

## 2023-02-11 DIAGNOSIS — I5040 Unspecified combined systolic (congestive) and diastolic (congestive) heart failure: Secondary | ICD-10-CM | POA: Diagnosis not present

## 2023-02-11 DIAGNOSIS — E1140 Type 2 diabetes mellitus with diabetic neuropathy, unspecified: Secondary | ICD-10-CM | POA: Diagnosis not present

## 2023-02-11 DIAGNOSIS — K219 Gastro-esophageal reflux disease without esophagitis: Secondary | ICD-10-CM | POA: Diagnosis not present

## 2023-02-11 DIAGNOSIS — E8809 Other disorders of plasma-protein metabolism, not elsewhere classified: Secondary | ICD-10-CM | POA: Diagnosis not present

## 2023-02-11 DIAGNOSIS — Z992 Dependence on renal dialysis: Secondary | ICD-10-CM | POA: Diagnosis not present

## 2023-02-11 DIAGNOSIS — G9341 Metabolic encephalopathy: Secondary | ICD-10-CM | POA: Diagnosis not present

## 2023-02-11 DIAGNOSIS — T8249XD Other complication of vascular dialysis catheter, subsequent encounter: Secondary | ICD-10-CM | POA: Diagnosis not present

## 2023-02-11 DIAGNOSIS — E1159 Type 2 diabetes mellitus with other circulatory complications: Secondary | ICD-10-CM | POA: Diagnosis not present

## 2023-02-11 DIAGNOSIS — E1122 Type 2 diabetes mellitus with diabetic chronic kidney disease: Secondary | ICD-10-CM | POA: Diagnosis not present

## 2023-02-11 DIAGNOSIS — D649 Anemia, unspecified: Secondary | ICD-10-CM | POA: Diagnosis not present

## 2023-02-11 DIAGNOSIS — N250 Renal osteodystrophy: Secondary | ICD-10-CM | POA: Diagnosis not present

## 2023-02-11 DIAGNOSIS — D631 Anemia in chronic kidney disease: Secondary | ICD-10-CM | POA: Diagnosis not present

## 2023-02-11 DIAGNOSIS — Z7401 Bed confinement status: Secondary | ICD-10-CM | POA: Diagnosis not present

## 2023-02-11 DIAGNOSIS — M6281 Muscle weakness (generalized): Secondary | ICD-10-CM | POA: Diagnosis not present

## 2023-02-11 DIAGNOSIS — J309 Allergic rhinitis, unspecified: Secondary | ICD-10-CM | POA: Diagnosis not present

## 2023-02-11 DIAGNOSIS — G3184 Mild cognitive impairment, so stated: Secondary | ICD-10-CM | POA: Diagnosis not present

## 2023-02-11 DIAGNOSIS — N390 Urinary tract infection, site not specified: Secondary | ICD-10-CM | POA: Diagnosis not present

## 2023-02-11 DIAGNOSIS — E441 Mild protein-calorie malnutrition: Secondary | ICD-10-CM | POA: Diagnosis not present

## 2023-02-11 DIAGNOSIS — G4700 Insomnia, unspecified: Secondary | ICD-10-CM | POA: Diagnosis not present

## 2023-02-11 DIAGNOSIS — I10 Essential (primary) hypertension: Secondary | ICD-10-CM | POA: Diagnosis not present

## 2023-02-11 DIAGNOSIS — Z743 Need for continuous supervision: Secondary | ICD-10-CM | POA: Diagnosis not present

## 2023-02-11 DIAGNOSIS — N186 End stage renal disease: Secondary | ICD-10-CM | POA: Diagnosis not present

## 2023-02-11 DIAGNOSIS — E871 Hypo-osmolality and hyponatremia: Secondary | ICD-10-CM | POA: Diagnosis not present

## 2023-02-11 DIAGNOSIS — A419 Sepsis, unspecified organism: Secondary | ICD-10-CM | POA: Diagnosis not present

## 2023-02-11 DIAGNOSIS — F17210 Nicotine dependence, cigarettes, uncomplicated: Secondary | ICD-10-CM | POA: Diagnosis not present

## 2023-02-11 DIAGNOSIS — Z23 Encounter for immunization: Secondary | ICD-10-CM | POA: Diagnosis not present

## 2023-02-11 DIAGNOSIS — I739 Peripheral vascular disease, unspecified: Secondary | ICD-10-CM | POA: Diagnosis not present

## 2023-02-11 DIAGNOSIS — E1151 Type 2 diabetes mellitus with diabetic peripheral angiopathy without gangrene: Secondary | ICD-10-CM | POA: Diagnosis not present

## 2023-02-11 DIAGNOSIS — R5383 Other fatigue: Secondary | ICD-10-CM | POA: Diagnosis not present

## 2023-02-11 LAB — GLUCOSE, CAPILLARY
Glucose-Capillary: 63 mg/dL — ABNORMAL LOW (ref 70–99)
Glucose-Capillary: 94 mg/dL (ref 70–99)
Glucose-Capillary: 122 mg/dL — ABNORMAL HIGH (ref 70–99)

## 2023-02-11 LAB — CBC
HCT: 34 % — ABNORMAL LOW (ref 39.0–52.0)
Hemoglobin: 11.9 g/dL — ABNORMAL LOW (ref 13.0–17.0)
MCH: 30.7 pg (ref 26.0–34.0)
MCHC: 35 g/dL (ref 30.0–36.0)
MCV: 87.9 fL (ref 80.0–100.0)
Platelets: 278 10*3/uL (ref 150–400)
RBC: 3.87 MIL/uL — ABNORMAL LOW (ref 4.22–5.81)
RDW: 13.2 % (ref 11.5–15.5)
WBC: 6.4 10*3/uL (ref 4.0–10.5)
nRBC: 0 % (ref 0.0–0.2)

## 2023-02-11 LAB — RENAL FUNCTION PANEL
Albumin: 3.1 g/dL — ABNORMAL LOW (ref 3.5–5.0)
Anion gap: 13 (ref 5–15)
BUN: 25 mg/dL — ABNORMAL HIGH (ref 8–23)
CO2: 26 mmol/L (ref 22–32)
Calcium: 8.5 mg/dL — ABNORMAL LOW (ref 8.9–10.3)
Chloride: 96 mmol/L — ABNORMAL LOW (ref 98–111)
Creatinine, Ser: 4.82 mg/dL — ABNORMAL HIGH (ref 0.61–1.24)
GFR, Estimated: 13 mL/min — ABNORMAL LOW (ref >60)
Glucose, Bld: 72 mg/dL (ref 70–99)
Phosphorus: 4.6 mg/dL (ref 2.5–4.6)
Potassium: 3.6 mmol/L (ref 3.5–5.1)
Sodium: 135 mmol/L (ref 135–145)

## 2023-02-11 LAB — VITAMIN B1: Vitamin B1 (Thiamine): 74.5 nmol/L (ref 66.5–200.0)

## 2023-02-11 MED ORDER — CHLORHEXIDINE GLUCONATE CLOTH 2 % EX PADS: 6.0000 | MEDICATED_PAD | Freq: Every day | CUTANEOUS | Status: DC

## 2023-02-11 NOTE — TOC Transition Note (Signed)
 Transition of Care [facility]) - Discharge Note   Patient Details  Name: [name] MRN: [id number] Date of Birth: April 08, 1960  Transition of Care [facility]) CM/SW Contact:  Tani Virgo ELINORE Louder, Student-Social Work Phone Number: 02/11/2023, 1:08 PM   Clinical Narrative:  MSW Student confirmed with MD that patient is stable for discharge. MSW Student notified sister Ronal and they are in agreement with discharge. MSW Student confirmed bed is available at [facility]. Transport arranged with [facility] for next available.   Number to call report [phone] RM: [phone]     Final next level of care: Skilled Nursing Facility Barriers to Discharge: Barriers Resolved   Patient Goals and CMS Choice Patient states their goals for this hospitalization and ongoing recovery are:: patient unable to participate in goal setting, not fully oriented CMS Medicare.gov Compare Post Acute Care list provided to:: Patient Represenative (must comment) Choice offered to / list presented to : Sibling [place] ownership interest in [facility].provided to:: Sibling    Discharge Placement              Patient chooses bed at: [facility] Patient to be transferred to facility by: [facility] Name of family member notified: Mary Patient and family notified of of transfer: 02/11/23  Discharge Plan and Services Additional resources added to the After Visit Summary for       Post Acute Care Choice: Skilled Nursing Facility                               Social Drivers of Health (SDOH) Interventions SDOH Screenings   Food Insecurity: Patient Unable To Answer (02/05/2023)  Housing: Patient Declined (02/05/2023)  Transportation Needs: Patient Declined (02/05/2023)  Utilities: Patient Declined (02/05/2023)  Depression (PHQ2-9): Low Risk  (02/01/2022)  Tobacco Use: High Risk (02/10/2023)     Readmission Risk Interventions    08/30/2022    5:52 PM 08/20/2021    3:13 PM  Readmission Risk Prevention Plan   Transportation Screening Complete Complete  PCP or Specialist Appt within 3-5 Days Complete Complete  HRI or Home Care Consult  Complete  Social Work Consult for Recovery Care Planning/Counseling  Complete  Palliative Care Screening  Not Applicable  Medication Review Oceanographer) Referral to Pharmacy Complete  PCP or Specialist appointment within 3-5 days of discharge Complete   HRI or Home Care Consult Complete   SW Recovery Care/Counseling Consult Complete   Palliative Care Screening Not Applicable   Skilled Nursing Facility Not Applicable

## 2023-02-11 NOTE — Plan of Care (Signed)

## 2023-02-11 NOTE — Progress Notes (Signed)
 [facility] Progress Note   Subjective:    Seen and examined patient at bedside. Tolerated yesterday's HD with minimal UF. He denies any acute issues and denies dizziness, SOB, and CP. Noted plan for discharge to SNF today. Next HD 2/8 if he remains inpatient.  Objective Vitals:   02/10/23 2025 02/10/23 2358 02/11/23 0408 02/11/23 0828  BP: 138/70 (!) 119/52 134/70 (!) 117/51  Pulse: 62 65 65 60  Resp: 18 16 18 18   Temp: 97.7 F (36.5 C) 98.7 F (37.1 C) 97.9 F (36.6 C) 98.4 F (36.9 C)  TempSrc: Oral Oral Oral Oral  SpO2: 95% 100% 95% 99%  Weight:      Height:       Physical Exam General: Chronically ill-appearing; NAD Heart: S1 and S2; No MRGs Lungs:Clear throughout. Breathing unlabored Abdomen: Soft and non-tender Extremities:L AKA; R BKA. No edema with stumps Dialysis Access: [facility]  Filed Weights   02/08/23 1915 02/10/23 0906 02/10/23 1300  Weight: 64.5 kg 63.6 kg 63 kg    Intake/Output Summary (Last 24 hours) at 02/11/2023 1137 Last data filed at 02/11/2023 0900 Gross per 24 hour  Intake 470 ml  Output 500 ml  Net -30 ml    Additional Objective Labs: Basic Metabolic Panel: Recent Labs  Lab 02/09/23 0555 02/10/23 0804 02/11/23 0625  NA 133* 132* 135  K 3.5 3.6 3.6  CL 96* 95* 96*  CO2 23 25 26   GLUCOSE 95 160* 72  BUN 26* 41* 25*  CREATININE 5.10* 6.56* 4.82*  CALCIUM  7.6* 7.9* 8.5*  PHOS 4.2 5.8* 4.6   Liver Function Tests: Recent Labs  Lab 02/09/23 0555 02/10/23 0804 02/11/23 0625  ALBUMIN  2.9* 3.0* 3.1*   No results for input(s): LIPASE, AMYLASE in the last 168 hours. CBC: Recent Labs  Lab 02/07/23 0621 02/08/23 0523 02/09/23 0555 02/10/23 0804 02/11/23 0625  WBC 5.8 6.0 6.4 5.1 6.4  HGB 10.3* 10.6* 11.0* 11.1* 11.9*  HCT 30.7* 31.3* 32.2* 32.5* 34.0*  MCV 89.0 89.2 89.0 88.8 87.9  PLT 143* 181 205 247 278   Blood Culture    Component Value Date/Time   SDES URINE, CLEAN CATCH 02/08/2023 1007   SPECREQUEST   02/08/2023 1007    Normal Performed at [facility], [address], [place], [place] [address]    CULT MULTIPLE SPECIES PRESENT, SUGGEST RECOLLECTION (A) 02/08/2023 1007   REPTSTATUS 02/09/2023 FINAL 02/08/2023 1007    Cardiac Enzymes: No results for input(s): CKTOTAL, CKMB, CKMBINDEX, TROPONINI in the last 168 hours. CBG: Recent Labs  Lab 02/10/23 1647 02/10/23 2104 02/11/23 0616 02/11/23 0821 02/11/23 1059  GLUCAP 94 113* 63* 94 122*   Iron Studies: No results for input(s): IRON, TIBC, TRANSFERRIN, FERRITIN in the last 72 hours. Lab Results  Component Value Date   INR 1.1 02/03/2023   INR 1.3 (H) 08/25/2022   INR 1.4 (H) 08/11/2021   Studies/Results: No results found.  Medications:  albumin  human      (feeding supplement) PROSource Plus  30 mL Oral BID BM   acetaminophen   1,000 mg Oral Q8H   aspirin  EC  81 mg Oral Daily   carvedilol   6.25 mg Oral BID WC   Chlorhexidine  Gluconate Cloth  6 each Topical Q0600   doxercalciferol   5 mcg Intravenous Q T,Th,Sa-HD   ezetimibe   10 mg Oral Daily   heparin   5,000 Units Subcutaneous Q8H   insulin  glargine-yfgn  15 Units Subcutaneous QHS   lidocaine   1 patch Transdermal Q24H  nicotine   7 mg Transdermal Daily   polyethylene glycol  17 g Oral Daily   rosuvastatin   10 mg Oral Daily   senna  1 tablet Oral Daily   thiamine   200 mg Oral Daily    Dialysis Orders: [place] T,Th,S 4 hrs 180NRe 500/800 66 kg 2.0 K /2.0 Ca TDC - No Heparin   - Hectorol  5 mcg IV three times per week - Mircera 30 mcg IV q 4 weeks (last dose 01/25/2023)  Assessment/Plan: AMS-Currently resolved. Likely some degree of uremia but has missed more dialysis in the past and this has not happened.  Could be multifactorial with influence of playing a big role but also progressive underlying dementia.  Influenza A-per primary ESRD -received dialysis on 1/31 and 2/1 last week.  Maintain TTS schedule.  Dialysis with some low flows causing  Cathflo to be used. Didn't hear of any issues or documentation of this on 2/4 and 2/5. K+ stable. Next HD 2/8 only if he remains inpatient. Hypertension/volume - Euvolemic on exam.  Maintain volume status with dialysis Anemia  - hemoglobin at goal.  ESA if needed Metabolic bone disease -corrected calcium  on lower side and phos at goal. On Hectorol . Nutrition - Albumin  low side. Protein supplements.  Dispo - Okay for discharge from a renal standpoint. Noted he's been accepted to [facility]. Hopeful he can leave today.  Charmaine Piety, NP [facility] 02/11/2023,11:37 AM  LOS: 5 days

## 2023-02-11 NOTE — Progress Notes (Signed)
 Report called to transfer facility [facility]) nurse, [place].

## 2023-02-11 NOTE — Plan of Care (Signed)
  Problem: Education: Goal: Knowledge of General Education information will improve Description: Including pain rating scale, medication(s)/side effects and non-pharmacologic comfort measures Outcome: Adequate for Discharge   Problem: Health Behavior/Discharge Planning: Goal: Ability to manage health-related needs will improve Outcome: Adequate for Discharge   Problem: Clinical Measurements: Goal: Ability to maintain clinical measurements within normal limits will improve Outcome: Adequate for Discharge Goal: Will remain free from infection Outcome: Adequate for Discharge Goal: Diagnostic test results will improve Outcome: Adequate for Discharge Goal: Respiratory complications will improve Outcome: Adequate for Discharge Goal: Cardiovascular complication will be avoided Outcome: Adequate for Discharge   Problem: Activity: Goal: Risk for activity intolerance will decrease Outcome: Adequate for Discharge   Problem: Nutrition: Goal: Adequate nutrition will be maintained Outcome: Adequate for Discharge   Problem: Coping: Goal: Level of anxiety will decrease Outcome: Adequate for Discharge   Problem: Elimination: Goal: Will not experience complications related to bowel motility Outcome: Adequate for Discharge Goal: Will not experience complications related to urinary retention Outcome: Adequate for Discharge   Problem: Pain Managment: Goal: General experience of comfort will improve and/or be controlled Outcome: Adequate for Discharge   Problem: Safety: Goal: Ability to remain free from injury will improve Outcome: Adequate for Discharge   Problem: Skin Integrity: Goal: Risk for impaired skin integrity will decrease Outcome: Adequate for Discharge   Problem: Acute Rehab OT Goals (only OT should resolve) Goal: Pt. Will Perform Lower Body Bathing Outcome: Adequate for Discharge Goal: Pt. Will Perform Lower Body Dressing Outcome: Adequate for Discharge Goal: Pt. Will  Transfer To Toilet Outcome: Adequate for Discharge Goal: OT Additional ADL Goal #1 Outcome: Adequate for Discharge

## 2023-02-11 NOTE — Progress Notes (Signed)
 D/C order noted. Contacted [facility] to be advised of pt's d/c today to snf and that pt should resume care tomorrow. SNF name provided to clinic and pt's HD appts added to AVS.   Lauraine Polite Renal Navigator [phone]

## 2023-02-11 NOTE — Progress Notes (Signed)
 Per night shift RN, Peterson Brandt, gave pt orange juice for blood sugar of 63.   This RN will recheck blood sugar this AM.

## 2023-02-11 NOTE — Discharge Planning (Signed)
 [facility] Patient Discharge Orders- [facility] CLINIC: [facility]-Discharged to SNF  Patient's name: [name] Admit/DC Dates: 02/03/2023 - 02/11/2023  Discharge Diagnoses: AMS - resolved. Likely 2nd missed HD. MRI (-) for Wernicke's encephalopathy.  Noted evidence of hyperintense lesion in the medial temporal lobe concerning for dementia.  Influenza A (+) - supportive care measures   Aranesp : Given: No    Last Hgb: 11.9 PRBC's Given: No  ESA dose for discharge: Hold ESA for now given recent Hgb here. Restart ESA protocol if Hgb is less than 10. IV Iron dose at discharge: N/A  Heparin  change: No heparin   EDW Change: Yes New EDW: Now under EDW here. Lower to 64kg. Notify renal team for any weight discrepancies.  Bath Change: No  Access intervention/Change: No  Hectorol  change: No  Discharge Labs: Calcium  8.5 Phosphorus 4.6 Albumin  3.1 K+ 3.6  IV Antibiotics: No  On Coumadin ?: No     D/C Meds to be reconciled by nurse after every discharge.  Completed By: Charmaine Piety, NP   Reviewed by: MD:______ RN_______

## 2023-02-11 NOTE — TOC Progression Note (Signed)
 Transition of Care [facility]) - Progression Note    Patient Details  Name: [name] MRN: [id number] Date of Birth: Mar 16, 1960  Transition of Care [facility]) CM/SW Contact  Almarie CHRISTELLA Goodie, [place] Phone Number: 02/11/2023, 11:22 AM  Clinical Narrative:   CSW received confirmation that [facility] can accept patient. CSW submitted and received insurance authorization for patient. CSW spoke with [facility] and clarified droplet precautions, they are unable to admit until tomorrow, if medically stable. CSW to continue to follow.    Expected Discharge Plan: Skilled Nursing Facility Barriers to Discharge: Continued Medical Work up, English As A Second Language Teacher, Other (must enter comment) (Droplet precautions, Dialysis patient)  Expected Discharge Plan and Services     Post Acute Care Choice: Skilled Nursing Facility Living arrangements for the past 2 months: Single Family Home                                       Social Determinants of Health (SDOH) Interventions SDOH Screenings   Food Insecurity: Patient Unable To Answer (02/05/2023)  Housing: Patient Declined (02/05/2023)  Transportation Needs: Patient Declined (02/05/2023)  Utilities: Patient Declined (02/05/2023)  Depression (PHQ2-9): Low Risk  (02/01/2022)  Tobacco Use: High Risk (02/10/2023)    Readmission Risk Interventions    08/30/2022    5:52 PM 08/20/2021    3:13 PM  Readmission Risk Prevention Plan  Transportation Screening Complete Complete  PCP or Specialist Appt within 3-5 Days Complete Complete  HRI or Home Care Consult  Complete  Social Work Consult for Recovery Care Planning/Counseling  Complete  Palliative Care Screening  Not Applicable  Medication Review Oceanographer) Referral to Pharmacy Complete  PCP or Specialist appointment within 3-5 days of discharge Complete   HRI or Home Care Consult Complete   SW Recovery Care/Counseling Consult Complete   Palliative Care Screening Not Applicable    Skilled Nursing Facility Not Applicable

## 2023-02-11 NOTE — Progress Notes (Addendum)
 Discharge instructions (including medications) provided in receiving facility packet.

## 2023-02-11 NOTE — Discharge Summary (Signed)
 Name: [name] MRN: [id number] DOB: [birth year] 63 y.o. PCP: Stephanie Freund, MD  Date of Admission: 02/03/2023  3:06 PM Date of Discharge: 02/11/2023 Attending Physician: Dr. Forest  Discharge Diagnosis: Principal Problem:   Altered mental status Active Problems:   Hypertension, essential   Sepsis, viral (HCC)   S/P BKA (below knee amputation) unilateral, right (HCC)   ESRD on dialysis (HCC)   Type 2 diabetes mellitus with diabetic peripheral angiopathy without gangrene, with long-term current use of insulin  (HCC)   S/P AKA (above knee amputation) unilateral, left (HCC)   Uremia   Influenza due to identified novel influenza A virus with other respiratory manifestations   Encephalopathy   Acute encephalopathy    Discharge Medications: Allergies as of 02/11/2023       Reactions   Benicar  [olmesartan ] Cough        Medication List     TAKE these medications    Accu-Chek Guide Test test strip Generic drug: glucose blood Check blood sugar 3 times a day   Accu-Chek Guide w/Device Kit Use to check your blood sugar three times daily.   Accu-Chek Softclix Lancets lancets Use to check blood sugar 3 (three) times daily.   aspirin  EC 81 MG tablet Take 1 tablet (81 mg total) by mouth daily. Swallow whole.   carvedilol  6.25 MG tablet Commonly known as: COREG  Take 1 tablet (6.25 mg total) by mouth 2 (two) times daily with meals for hypertension   diclofenac  Sodium 1 % Gel Commonly known as: VOLTAREN  Apply 4 g topically 4 (four) times daily.   DULoxetine  60 MG capsule Commonly known as: CYMBALTA  Take 1 capsule (60 mg total) by mouth daily.   ezetimibe  10 MG tablet Commonly known as: ZETIA  Take 1 tablet (10 mg total) by mouth daily.   insulin  lispro 100 UNIT/ML KwikPen Commonly known as: HUMALOG  Inject 5 Units into the skin 3 (three) times daily with meals.   Insupen Pen Needles 32G X 4 MM Misc Generic drug: Insulin  Pen Needle Use to inject insulin  4 (four)  times daily.   Lantus  SoloStar 100 UNIT/ML Solostar Pen Generic drug: insulin  glargine Inject 15 Units into the skin at bedtime. Please ship to patient. Thank you   melatonin 3 MG Tabs tablet Commonly known as: KP Melatonin Take 1 tablet (3 mg total) by mouth at bedtime for insomnia.   pregabalin  25 MG capsule Commonly known as: LYRICA  Take 1 capsule by mouth once a day   rosuvastatin  10 MG tablet Commonly known as: CRESTOR  Take 1 tablet (10 mg total) by mouth daily.        Disposition and follow-up:   [name] was discharged from [facility] in [place] condition.  At the hospital follow up visit please address:  1.  Follow-up:  a.  AMS Etiology unclear, has resolved.  Likely setting of missed dialysis.  MRI negative for Wernicke's encephalopathy. There was evidence of hyperintense lesion in the medial temporal lobe concerning for dementia. -Please consider referral to neurologist/geriatrics?   b.  ESRD Stable electrolyte, please confirm patient is adherent to dialysis schedule.  2.  Labs / imaging needed at time of follow-up: CBC  3.  Pending labs/ test needing follow-up: None   4.  Medication Changes Abx -   End Date:  Follow-up Appointments:  Follow-up Information     Stephanie Freund, MD. Schedule an appointment as soon as possible for a visit.   Specialty: Internal Medicine Contact information: [address] [place]  [place] [address] [phone]                 Hospital Course by problem list: [name] is a 63 y.o. male with a history of ESRD on HD TTS, PAD  s/p R BKA L AKA, T2DM who presented with confusion and admitted for altered mental status.   Acute metabolic encephalopathy  Suspected Uremia in setting of ESRD on HD NAGMA + AGMA Presented with shortness of breath and altered mental status.  No focal deficits, normal head CT.  Etiology unclear likely in the setting of mild uremia 2/2 to missed dialysis.  He was dialyzed and  mentation has improved.  Had a brain MRI to rule out medication encephalopathy given concern for confabulation.  MRI negative for Wernicke's encephalopathy.    # Concern for Alzheimer's on MRI # Suspected Dementia Evidence of mesial temporal lobe atrophy on MRI to the brain.  Findings could be concerning for Alzheimer's. -Follow-up with neurology/geriatrics outpatient?  Flu A positive Satting normal on room air, afebrile, supportively treated.  ESRD HD TTS He was dialyzed while in the hospital.  Will continue with dialysis outpatient.    PAD s/p R BKA L AKA Elevated D-dimer Elevated D-dimer of 2.32, follow-up VQ scan was normal.  We continued with Crestor  10 mg, Zetia  10 mg and aspirin .   Chronic Conditions Type II DM: continue home medication Semglee  15 units at bedtime HTN: continue home medication carvedilol  6.25 mg BID Tobacco Use: Nicotine  patch     Discharge Subjective: Patient evaluated at bedside this AM.  Discharge Exam:   BP (!) 117/51 (BP Location: Left Arm)   Pulse 60   Temp 98.4 F (36.9 C) (Oral)   Resp 18   Ht 4' 7 (1.397 m) Comment: Pt's stated height.  RN did not measure pt.  Wt 63 kg   SpO2 99%   BMI 32.28 kg/m   Constitutional: NAD Cardiovascular: regular rate and rhythm, no m/r/g Pulmonary/Chest: normal work of breathing on room air, lungs clear to auscultation bilaterally Abdominal: soft, non-tender, non-distended MSK: Bilateral below-knee amputation..  Pertinent Labs, Studies, and Procedures:     Latest Ref Rng & Units 02/11/2023    6:25 AM 02/10/2023    8:04 AM 02/09/2023    5:55 AM  CBC  WBC 4.0 - 10.5 K/uL 6.4  5.1  6.4   Hemoglobin 13.0 - 17.0 g/dL 88.0  88.8  88.9   Hematocrit 39.0 - 52.0 % 34.0  32.5  32.2   Platelets 150 - 400 K/uL 278  247  205        Latest Ref Rng & Units 02/11/2023    6:25 AM 02/10/2023    8:04 AM 02/09/2023    5:55 AM  CMP  Glucose 70 - 99 mg/dL 72  839  95   BUN 8 - 23 mg/dL 25  41  26   Creatinine 0.61 - 1.24  mg/dL 5.17  3.43  4.89   Sodium 135 - 145 mmol/L 135  132  133   Potassium 3.5 - 5.1 mmol/L 3.6  3.6  3.5   Chloride 98 - 111 mmol/L 96  95  96   CO2 22 - 32 mmol/L 26  25  23    Calcium  8.9 - 10.3 mg/dL 8.5  7.9  7.6     NM Pulmonary Perfusion Result Date: 02/04/2023 CLINICAL DATA:  Pulmonary embolism (PE) suspected, low to intermediate prob, positive D-dimer EXAM: NUCLEAR MEDICINE PERFUSION LUNG SCAN TECHNIQUE: Perfusion images were  obtained in multiple projections after intravenous injection of radiopharmaceutical. Ventilation scans intentionally deferred if perfusion scan and chest x-ray adequate for interpretation during COVID 19 epidemic. RADIOPHARMACEUTICALS:  4.1 mCi Tc-45m MAA IV COMPARISON:  X-ray 02/03/2023 FINDINGS: Homogeneous distribution of radiotracer throughout both lungs. No perfusion defects. IMPRESSION: Normal perfusion lung scan.  No evidence of PE. Electronically Signed   By: Mabel Converse D.O.   On: 02/04/2023 13:52   DG Chest Portable 1 View Result Date: 02/03/2023 CLINICAL DATA:  Sepsis. EXAM: PORTABLE CHEST 1 VIEW COMPARISON:  Chest radiograph dated 02/02/2023. FINDINGS: Dialysis catheter in similar position. No focal consolidation, pleural effusion or pneumothorax. The cardiac silhouette is within normal limits. No acute osseous pathology. IMPRESSION: No active disease. Electronically Signed   By: Vanetta Chou M.D.   On: 02/03/2023 17:16   CT ABDOMEN PELVIS WO CONTRAST Result Date: 02/03/2023 CLINICAL DATA:  Sepsis.   tachypnea and cough. EXAM: CT ABDOMEN AND PELVIS WITHOUT CONTRAST TECHNIQUE: Multidetector CT imaging of the abdomen and pelvis was performed following the standard protocol without IV contrast. RADIATION DOSE REDUCTION: This exam was performed according to the departmental dose-optimization program which includes automated exposure control, adjustment of the mA and/or kV according to patient size and/or use of iterative reconstruction technique.  COMPARISON:  06/27/2021 FINDINGS: Mild motion degradation throughout. Exam also degraded by patient arm position, lack of oral or IV contrast, overlying EKG wires and leads. Lower chest: Bilateral lower lobe interstitial accentuation. Mild cardiomegaly with trace left pleural fluid. Hepatobiliary: Grossly normal noncontrast appearance of the liver, gallbladder, biliary tract. Pancreas: Pancreatic atrophy. No duct dilatation or acute inflammation. Spleen: Normal in size, without focal abnormality. Adrenals/Urinary Tract: Normal adrenal glands. No renal calculi or hydronephrosis. Mild thickening of the left anterior renal fascia inferiorly on 47/3 is at the site of a hematoma on 06/27/2021 . No hydroureter or ureteric calculi.  No bladder calculi. Stomach/Bowel: Normal stomach, without wall thickening. Fluid-filled colon. Normal terminal ileum and appendix. Normal small bowel. Vascular/Lymphatic: Aortic atherosclerosis. No abdominopelvic adenopathy. Reproductive: Moderate prostatomegaly. Other: No significant free fluid.  No free intraperitoneal air. Musculoskeletal: No acute osseous abnormality. IMPRESSION: 1. Multifactorial degradation, as detailed above. 2. Bilateral lower lobe interstitial thickening is nonspecific, likely related to mild infection/inflammation of indeterminate acuity. Small left pleural effusion. 3.  fluid-filled colon suggests a diarrheal state 4. Small left pleural effusion 5. Soft tissue thickening within the inferior left renal fascia is at the site of a larger hematoma on 06/27/2021, likely secondary. Recurrent small volume retroperitoneal hematoma cannot technically be excluded. 6. Prostatomegaly 7.  Aortic Atherosclerosis (ICD10-I70.0). Electronically Signed   By: Rockey Kilts M.D.   On: 02/03/2023 16:54   CT Head Wo Contrast Result Date: 02/03/2023 CLINICAL DATA:  Altered mental status. EXAM: CT HEAD WITHOUT CONTRAST TECHNIQUE: Contiguous axial images were obtained from the base of the  skull through the vertex without intravenous contrast. RADIATION DOSE REDUCTION: This exam was performed according to the departmental dose-optimization program which includes automated exposure control, adjustment of the mA and/or kV according to patient size and/or use of iterative reconstruction technique. COMPARISON:  Head CT dated 08/24/2022. FINDINGS: Brain: Mild age-related atrophy and chronic microvascular ischemic changes. There is no acute intracranial hemorrhage. No mass effect or midline shift. No extra-axial fluid collection. Vascular: No hyperdense vessel or unexpected calcification. Skull: Normal. Negative for fracture or focal lesion. Sinuses/Orbits: No acute finding. Other: None IMPRESSION: 1. No acute intracranial pathology. 2. Mild age-related atrophy and chronic microvascular ischemic changes. Electronically Signed  By: Vanetta Chou M.D.   On: 02/03/2023 16:49     Discharge Instructions:   Signed: Celestina Czar, MD 02/11/2023, 11:23 AM   Pager: [phone]

## 2023-02-11 NOTE — Discharge Instructions (Signed)
 FOLLOW-UP INSTRUCTIONS:  Thank you for allowing us  to be part of your care. You were hospitalized for confusion, which has resolved.  You worked with physical therapy /Occupational Therapy, and given ongoing weakness, recommends skilled nursing facility.  Please follow up with the following providers: A. Stephanie Freund, MD, [address] [place] [address], [phone]    Please note these changes made to your medications:   A. Medications to continue: No outpatient medications have been marked as taking for the 02/03/23 encounter [facility] Encounter).      B. Medications to start:  Current Facility-Administered Medications:    (feeding supplement) PROSource Plus liquid 30 mL, 30 mL, Oral, BID BM, [name] Ricka Maidens, NP, 30 mL at 02/11/23 0830   acetaminophen  (TYLENOL ) tablet 1,000 mg, 1,000 mg, Oral, Q8H, Zheng, Michael, DO, 1,000 mg at 02/11/23 9387   albumin  human 25 % solution 25 g, 25 g, Intravenous, Daily PRN, Macel Jayson PARAS, MD   aspirin  EC tablet 81 mg, 81 mg, Oral, Daily, Nooruddin, Saad, MD, 81 mg at 02/11/23 9167   benzonatate  (TESSALON ) capsule 100 mg, 100 mg, Oral, TID PRN, Zheng, Michael, DO, 100 mg at 02/09/23 0227   carvedilol  (COREG ) tablet 6.25 mg, 6.25 mg, Oral, BID WC, Nooruddin, Saad, MD, 6.25 mg at 02/11/23 9167   Chlorhexidine  Gluconate Cloth 2 % PADS 6 each, 6 each, Topical, Q0600, Lenon Charmaine BRAVO, NP, 6 each at 02/11/23 [phone]   doxercalciferol  (HECTOROL ) injection 5 mcg, 5 mcg, Intravenous, Q T,Th,Sa-HD, [name] Ricka Maidens, NP, 5 mcg at 02/10/23 1158   ezetimibe  (ZETIA ) tablet 10 mg, 10 mg, Oral, Daily, Nooruddin, Saad, MD, 10 mg at 02/11/23 9167   heparin  injection 5,000 Units, 5,000 Units, Subcutaneous, Q8H, Nooruddin, Saad, MD, 5,000 Units at 02/11/23 0612   HYDROmorphone  (DILAUDID ) tablet 1 mg, 1 mg, Oral, Q6H PRN, Zheng, Michael, DO, 1 mg at 02/08/23 9640   insulin  glargine-yfgn (SEMGLEE ) injection 15 Units, 15 Units, Subcutaneous, QHS,  Nooruddin, Saad, MD, 15 Units at 02/10/23 2157   lidocaine  (LIDODERM ) 5 % 1 patch, 1 patch, Transdermal, Q24H, Zheng, Michael, DO, 1 patch at 02/11/23 0831   loperamide  (IMODIUM ) capsule 2 mg, 2 mg, Oral, PRN, Zheng, Michael, DO   nicotine  (NICODERM CQ  - dosed in mg/24 hr) patch 7 mg, 7 mg, Transdermal, Daily, Nooruddin, Saad, MD, 7 mg at 02/11/23 9167   polyethylene glycol (MIRALAX  / GLYCOLAX ) packet 17 g, 17 g, Oral, Daily, Zheng, Michael, DO, 17 g at 02/11/23 0831   rosuvastatin  (CRESTOR ) tablet 10 mg, 10 mg, Oral, Daily, Nooruddin, Saad, MD, 10 mg at 02/11/23 9167   senna (SENOKOT) tablet 8.6 mg, 1 tablet, Oral, Daily, Zheng, Michael, DO, 8.6 mg at 02/11/23 9167   thiamine  (VITAMIN B1) tablet 200 mg, 200 mg, Oral, Daily, Zheng, Michael, DO, 200 mg at 02/11/23 0831   C. Medications to discontinue: None    Please make sure to return to the hospital if you have worsening confusion or shortness of breath.  Please call our clinic if you have any questions or concerns, we may be able to help and keep you from a long and expensive emergency room wait. Our clinic and after hours phone number is [phone], the best time to call is Monday through Friday 9 am to 4 pm but there is always someone available 24/7 if you have an emergency. If you need medication refills please notify your pharmacy one week in advance and they will send us  a request.

## 2023-02-12 DIAGNOSIS — D631 Anemia in chronic kidney disease: Secondary | ICD-10-CM | POA: Diagnosis not present

## 2023-02-12 DIAGNOSIS — N2581 Secondary hyperparathyroidism of renal origin: Secondary | ICD-10-CM | POA: Diagnosis not present

## 2023-02-12 DIAGNOSIS — E1122 Type 2 diabetes mellitus with diabetic chronic kidney disease: Secondary | ICD-10-CM | POA: Diagnosis not present

## 2023-02-12 DIAGNOSIS — E1140 Type 2 diabetes mellitus with diabetic neuropathy, unspecified: Secondary | ICD-10-CM | POA: Diagnosis not present

## 2023-02-12 DIAGNOSIS — N186 End stage renal disease: Secondary | ICD-10-CM | POA: Diagnosis not present

## 2023-02-12 DIAGNOSIS — T8249XD Other complication of vascular dialysis catheter, subsequent encounter: Secondary | ICD-10-CM | POA: Diagnosis not present

## 2023-02-12 DIAGNOSIS — D509 Iron deficiency anemia, unspecified: Secondary | ICD-10-CM | POA: Diagnosis not present

## 2023-02-12 DIAGNOSIS — E1151 Type 2 diabetes mellitus with diabetic peripheral angiopathy without gangrene: Secondary | ICD-10-CM | POA: Diagnosis not present

## 2023-02-12 DIAGNOSIS — Z992 Dependence on renal dialysis: Secondary | ICD-10-CM | POA: Diagnosis not present

## 2023-02-14 DIAGNOSIS — M6281 Muscle weakness (generalized): Secondary | ICD-10-CM | POA: Diagnosis not present

## 2023-02-14 DIAGNOSIS — G3184 Mild cognitive impairment, so stated: Secondary | ICD-10-CM | POA: Diagnosis not present

## 2023-02-14 DIAGNOSIS — D631 Anemia in chronic kidney disease: Secondary | ICD-10-CM | POA: Diagnosis not present

## 2023-02-14 DIAGNOSIS — Z992 Dependence on renal dialysis: Secondary | ICD-10-CM | POA: Diagnosis not present

## 2023-02-14 DIAGNOSIS — E785 Hyperlipidemia, unspecified: Secondary | ICD-10-CM | POA: Diagnosis not present

## 2023-02-14 DIAGNOSIS — F17210 Nicotine dependence, cigarettes, uncomplicated: Secondary | ICD-10-CM | POA: Diagnosis not present

## 2023-02-14 DIAGNOSIS — E1140 Type 2 diabetes mellitus with diabetic neuropathy, unspecified: Secondary | ICD-10-CM | POA: Diagnosis not present

## 2023-02-14 DIAGNOSIS — N186 End stage renal disease: Secondary | ICD-10-CM | POA: Diagnosis not present

## 2023-02-14 DIAGNOSIS — E441 Mild protein-calorie malnutrition: Secondary | ICD-10-CM | POA: Diagnosis not present

## 2023-02-15 ENCOUNTER — Encounter: Payer: Self-pay | Admitting: Student

## 2023-02-15 DIAGNOSIS — D631 Anemia in chronic kidney disease: Secondary | ICD-10-CM | POA: Diagnosis not present

## 2023-02-15 DIAGNOSIS — N2581 Secondary hyperparathyroidism of renal origin: Secondary | ICD-10-CM | POA: Diagnosis not present

## 2023-02-15 DIAGNOSIS — E1140 Type 2 diabetes mellitus with diabetic neuropathy, unspecified: Secondary | ICD-10-CM | POA: Diagnosis not present

## 2023-02-15 DIAGNOSIS — Z992 Dependence on renal dialysis: Secondary | ICD-10-CM | POA: Diagnosis not present

## 2023-02-15 DIAGNOSIS — E1151 Type 2 diabetes mellitus with diabetic peripheral angiopathy without gangrene: Secondary | ICD-10-CM | POA: Diagnosis not present

## 2023-02-15 DIAGNOSIS — E1122 Type 2 diabetes mellitus with diabetic chronic kidney disease: Secondary | ICD-10-CM | POA: Diagnosis not present

## 2023-02-15 DIAGNOSIS — D509 Iron deficiency anemia, unspecified: Secondary | ICD-10-CM | POA: Diagnosis not present

## 2023-02-15 DIAGNOSIS — N186 End stage renal disease: Secondary | ICD-10-CM | POA: Diagnosis not present

## 2023-02-15 DIAGNOSIS — T8249XD Other complication of vascular dialysis catheter, subsequent encounter: Secondary | ICD-10-CM | POA: Diagnosis not present

## 2023-02-16 ENCOUNTER — Encounter: Payer: 59 | Admitting: Student

## 2023-02-16 DIAGNOSIS — Z992 Dependence on renal dialysis: Secondary | ICD-10-CM | POA: Diagnosis not present

## 2023-02-16 DIAGNOSIS — R5383 Other fatigue: Secondary | ICD-10-CM | POA: Diagnosis not present

## 2023-02-16 DIAGNOSIS — M6281 Muscle weakness (generalized): Secondary | ICD-10-CM | POA: Diagnosis not present

## 2023-02-16 DIAGNOSIS — N186 End stage renal disease: Secondary | ICD-10-CM | POA: Diagnosis not present

## 2023-02-16 NOTE — Progress Notes (Deleted)
 63-year-old male with ESRD on HD TTS, PAD s/p right BKA and left AKA, T2DM who presents for hospital follow-up after he was admitted from 02/03/2023 to 02/11/2023 with uremic encephalopathy  Papers?    CC: Hospital Follow Up   HPI:  [name] is a 63 y.o. male with pertinent PMH of ESRD on HD TTS, PAD s/p right BKA and left AKA, T2DM who presents for hospital follow-up after he was admitted from 02/03/2023 to 02/11/2023 with uremic encephalopathy. Please see assessment and plan below for further details.  Past Medical History:  Diagnosis Date   Atherosclerosis of native arteries of extremities with gangrene, left leg (HCC)    ESRD on hemodialysis (HCC)    Gangrene (HCC)    right foot   GERD (gastroesophageal reflux disease)    HFrEF (heart failure with reduced ejection fraction) (HCC)    Hyperlipidemia    Hypertension    Infection of amputation stump, left lower extremity (HCC) 08/10/2021   Neuromuscular disorder (HCC)    diabetic neruopathy - hands   Osteomyelitis (HCC) 2010   left foot, s/p midfoot amputation   Osteomyelitis (HCC) 09/2013   RT BKA   Osteomyelitis of ankle or foot 05/2011   rt foot, s/p 5th ray amputation   PAD (peripheral artery disease) (HCC)    Pneumonia 2010   Retroperitoneal hematoma    05/08/2021 renal biopsy complicated by retroperitoneal hematoma. 5/9 - 05/20/2021 H/H 6.9/20.9.  Imaging revealed large retroperitoneal hematoma.  S/P 2 units packed cells H/H stabilized with final values of 11.3/36.4.   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   aspirin EC 81 mg, Oral, Daily, Swallow  whole.   Blood Glucose Monitoring Suppl (ACCU-CHEK GUIDE) w/Device KIT Use to check your blood sugar three times daily.   carvedilol (COREG) 6.25 MG tablet Take 1 tablet (6.25 mg total) by mouth 2 (two) times daily with meals for hypertension   diclofenac Sodium (VOLTAREN) 4 g, Topical, 4 times daily   DULoxetine (CYMBALTA) 60 mg, Oral, Daily   ezetimibe (ZETIA) 10 mg, Oral, Daily   glucose blood (ACCU-CHEK AVIVA PLUS) test strip Check blood sugar 3 times a day   insulin lispro (HUMALOG) 5 Units, Subcutaneous, 3 times daily with meals   Insulin Pen Needle 32G X 4 MM MISC Use to inject insulin 4 (four) times daily.   Lantus SoloStar 15 Units, Subcutaneous, Daily at bedtime, Please ship to patient. Thank you   melatonin (KP MELATONIN) 3 MG TABS tablet Take 1 tablet (3 mg total) by mouth at bedtime for insomnia.   pregabalin (LYRICA) 25 MG capsule Take 1 capsule by mouth once a day   rosuvastatin (CRESTOR) 10 mg, Oral, Daily     Review of Systems:   Pertinent items noted in HPI and/or A&P.  Physical Exam:  There were no vitals filed for this visit.  Constitutional:***. In no acute distress. HEENT: Normocephalic, atraumatic, Sclera non-icteric, PERRL, EOM intact Cardio:Regular rate and rhythm. 2+ bilateral {PulseLoc:28294} pulses. Pulm:Clear to auscultation bilaterally. Normal work of breathing on room air. Abdomen: Soft, non-tender, non-distended, positive bowel sounds. WUJ:[id number] for extremity edema. Skin:Warm  and dry. Neuro:Alert and oriented x3. No focal deficit noted. Psych:Pleasant mood and affect.   Assessment & Plan:   No problem-specific Assessment & Plan notes found for this encounter.    Patient {GC/GE:3044014::"discussed with","seen with"} Dr. {NAMES:3044014::"Lau","Guilloud","Hoffman","Machen","Mullen","Narendra","Williams","Vincent"}  Rocky Morel, DO [facility] Internal Medicine Resident PGY-2 Clinic Phone: [phone] Pager: [phone]

## 2023-02-17 DIAGNOSIS — E1159 Type 2 diabetes mellitus with other circulatory complications: Secondary | ICD-10-CM | POA: Diagnosis not present

## 2023-02-17 DIAGNOSIS — D509 Iron deficiency anemia, unspecified: Secondary | ICD-10-CM | POA: Diagnosis not present

## 2023-02-17 DIAGNOSIS — E1140 Type 2 diabetes mellitus with diabetic neuropathy, unspecified: Secondary | ICD-10-CM | POA: Diagnosis not present

## 2023-02-17 DIAGNOSIS — Z992 Dependence on renal dialysis: Secondary | ICD-10-CM | POA: Diagnosis not present

## 2023-02-17 DIAGNOSIS — G9341 Metabolic encephalopathy: Secondary | ICD-10-CM | POA: Diagnosis not present

## 2023-02-17 DIAGNOSIS — N2581 Secondary hyperparathyroidism of renal origin: Secondary | ICD-10-CM | POA: Diagnosis not present

## 2023-02-17 DIAGNOSIS — T8249XD Other complication of vascular dialysis catheter, subsequent encounter: Secondary | ICD-10-CM | POA: Diagnosis not present

## 2023-02-17 DIAGNOSIS — N186 End stage renal disease: Secondary | ICD-10-CM | POA: Diagnosis not present

## 2023-02-17 DIAGNOSIS — E1151 Type 2 diabetes mellitus with diabetic peripheral angiopathy without gangrene: Secondary | ICD-10-CM | POA: Diagnosis not present

## 2023-02-17 DIAGNOSIS — A419 Sepsis, unspecified organism: Secondary | ICD-10-CM | POA: Diagnosis not present

## 2023-02-17 DIAGNOSIS — E1122 Type 2 diabetes mellitus with diabetic chronic kidney disease: Secondary | ICD-10-CM | POA: Diagnosis not present

## 2023-02-17 DIAGNOSIS — D631 Anemia in chronic kidney disease: Secondary | ICD-10-CM | POA: Diagnosis not present

## 2023-02-21 DIAGNOSIS — N186 End stage renal disease: Secondary | ICD-10-CM | POA: Diagnosis not present

## 2023-02-21 DIAGNOSIS — M6281 Muscle weakness (generalized): Secondary | ICD-10-CM | POA: Diagnosis not present

## 2023-02-21 DIAGNOSIS — D649 Anemia, unspecified: Secondary | ICD-10-CM | POA: Diagnosis not present

## 2023-02-21 DIAGNOSIS — Z992 Dependence on renal dialysis: Secondary | ICD-10-CM | POA: Diagnosis not present

## 2023-02-22 DIAGNOSIS — Z992 Dependence on renal dialysis: Secondary | ICD-10-CM | POA: Diagnosis not present

## 2023-02-22 DIAGNOSIS — T8249XD Other complication of vascular dialysis catheter, subsequent encounter: Secondary | ICD-10-CM | POA: Diagnosis not present

## 2023-02-22 DIAGNOSIS — N2581 Secondary hyperparathyroidism of renal origin: Secondary | ICD-10-CM | POA: Diagnosis not present

## 2023-02-22 DIAGNOSIS — E1122 Type 2 diabetes mellitus with diabetic chronic kidney disease: Secondary | ICD-10-CM | POA: Diagnosis not present

## 2023-02-22 DIAGNOSIS — D631 Anemia in chronic kidney disease: Secondary | ICD-10-CM | POA: Diagnosis not present

## 2023-02-22 DIAGNOSIS — N186 End stage renal disease: Secondary | ICD-10-CM | POA: Diagnosis not present

## 2023-02-22 DIAGNOSIS — E1151 Type 2 diabetes mellitus with diabetic peripheral angiopathy without gangrene: Secondary | ICD-10-CM | POA: Diagnosis not present

## 2023-02-22 DIAGNOSIS — D509 Iron deficiency anemia, unspecified: Secondary | ICD-10-CM | POA: Diagnosis not present

## 2023-02-22 DIAGNOSIS — E1140 Type 2 diabetes mellitus with diabetic neuropathy, unspecified: Secondary | ICD-10-CM | POA: Diagnosis not present

## 2023-02-23 ENCOUNTER — Ambulatory Visit: Payer: 59

## 2023-02-24 DIAGNOSIS — N2581 Secondary hyperparathyroidism of renal origin: Secondary | ICD-10-CM | POA: Diagnosis not present

## 2023-02-24 DIAGNOSIS — N186 End stage renal disease: Secondary | ICD-10-CM | POA: Diagnosis not present

## 2023-02-24 DIAGNOSIS — Z992 Dependence on renal dialysis: Secondary | ICD-10-CM | POA: Diagnosis not present

## 2023-02-24 DIAGNOSIS — M6281 Muscle weakness (generalized): Secondary | ICD-10-CM | POA: Diagnosis not present

## 2023-02-24 DIAGNOSIS — E1140 Type 2 diabetes mellitus with diabetic neuropathy, unspecified: Secondary | ICD-10-CM | POA: Diagnosis not present

## 2023-02-24 DIAGNOSIS — T8249XD Other complication of vascular dialysis catheter, subsequent encounter: Secondary | ICD-10-CM | POA: Diagnosis not present

## 2023-02-24 DIAGNOSIS — E1151 Type 2 diabetes mellitus with diabetic peripheral angiopathy without gangrene: Secondary | ICD-10-CM | POA: Diagnosis not present

## 2023-02-24 DIAGNOSIS — D631 Anemia in chronic kidney disease: Secondary | ICD-10-CM | POA: Diagnosis not present

## 2023-02-24 DIAGNOSIS — E1122 Type 2 diabetes mellitus with diabetic chronic kidney disease: Secondary | ICD-10-CM | POA: Diagnosis not present

## 2023-02-24 DIAGNOSIS — D509 Iron deficiency anemia, unspecified: Secondary | ICD-10-CM | POA: Diagnosis not present

## 2023-02-28 ENCOUNTER — Telehealth: Payer: Self-pay

## 2023-02-28 DIAGNOSIS — G9341 Metabolic encephalopathy: Secondary | ICD-10-CM | POA: Diagnosis not present

## 2023-02-28 DIAGNOSIS — A4189 Other specified sepsis: Secondary | ICD-10-CM | POA: Diagnosis not present

## 2023-02-28 DIAGNOSIS — Z992 Dependence on renal dialysis: Secondary | ICD-10-CM | POA: Diagnosis not present

## 2023-02-28 DIAGNOSIS — Z89511 Acquired absence of right leg below knee: Secondary | ICD-10-CM | POA: Diagnosis not present

## 2023-02-28 DIAGNOSIS — N186 End stage renal disease: Secondary | ICD-10-CM | POA: Diagnosis not present

## 2023-02-28 DIAGNOSIS — E1122 Type 2 diabetes mellitus with diabetic chronic kidney disease: Secondary | ICD-10-CM | POA: Diagnosis not present

## 2023-02-28 DIAGNOSIS — I120 Hypertensive chronic kidney disease with stage 5 chronic kidney disease or end stage renal disease: Secondary | ICD-10-CM | POA: Diagnosis not present

## 2023-02-28 DIAGNOSIS — E1151 Type 2 diabetes mellitus with diabetic peripheral angiopathy without gangrene: Secondary | ICD-10-CM | POA: Diagnosis not present

## 2023-02-28 DIAGNOSIS — Z89612 Acquired absence of left leg above knee: Secondary | ICD-10-CM | POA: Diagnosis not present

## 2023-02-28 NOTE — Transitions of Care (Post Inpatient/ED Visit) (Unsigned)
   02/28/2023  Name: [name] MRN: [id number] DOB: 11/28/1960  Today's TOC FU Call Status: Today's TOC FU Call Status:: Unsuccessful Call (1st Attempt) Unsuccessful Call (1st Attempt) Date: 02/28/23  Attempted to reach the patient regarding the most recent Inpatient/ED visit.  Follow Up Plan: Additional outreach attempts will be made to reach the patient to complete the Transitions of Care (Post Inpatient/ED visit) call.   Signature  Karena Addison, LPN [facility] Nurse Health Advisor Direct Dial [phone]

## 2023-03-01 DIAGNOSIS — D509 Iron deficiency anemia, unspecified: Secondary | ICD-10-CM | POA: Diagnosis not present

## 2023-03-01 DIAGNOSIS — N2581 Secondary hyperparathyroidism of renal origin: Secondary | ICD-10-CM | POA: Diagnosis not present

## 2023-03-01 DIAGNOSIS — E1122 Type 2 diabetes mellitus with diabetic chronic kidney disease: Secondary | ICD-10-CM | POA: Diagnosis not present

## 2023-03-01 DIAGNOSIS — E1140 Type 2 diabetes mellitus with diabetic neuropathy, unspecified: Secondary | ICD-10-CM | POA: Diagnosis not present

## 2023-03-01 DIAGNOSIS — Z992 Dependence on renal dialysis: Secondary | ICD-10-CM | POA: Diagnosis not present

## 2023-03-01 DIAGNOSIS — E1151 Type 2 diabetes mellitus with diabetic peripheral angiopathy without gangrene: Secondary | ICD-10-CM | POA: Diagnosis not present

## 2023-03-01 DIAGNOSIS — D631 Anemia in chronic kidney disease: Secondary | ICD-10-CM | POA: Diagnosis not present

## 2023-03-01 DIAGNOSIS — N186 End stage renal disease: Secondary | ICD-10-CM | POA: Diagnosis not present

## 2023-03-01 DIAGNOSIS — T8249XD Other complication of vascular dialysis catheter, subsequent encounter: Secondary | ICD-10-CM | POA: Diagnosis not present

## 2023-03-01 NOTE — Transitions of Care (Post Inpatient/ED Visit) (Unsigned)
   03/01/2023  Name: Dallin Mccorkel MRN: [id number] DOB: 11/30/1960  Today's TOC FU Call Status: Today's TOC FU Call Status:: Unsuccessful Call (1st Attempt) Unsuccessful Call (1st Attempt) Date: 02/28/23  Attempted to reach the patient regarding the most recent Inpatient/ED visit.  Follow Up Plan: Additional outreach attempts will be made to reach the patient to complete the Transitions of Care (Post Inpatient/ED visit) call.   Signature Karena Addison, LPN [facility] Nurse Health Advisor Direct Dial [phone]

## 2023-03-03 ENCOUNTER — Ambulatory Visit: Payer: Self-pay

## 2023-03-03 NOTE — Patient Outreach (Signed)
 Care Coordination   03/03/2023 Name: [name] MRN: [id number] DOB: 09-25-1960   Care Coordination Outreach Attempts:  An unsuccessful telephone outreach was attempted today to offer the patient information about available complex care management services.  Follow Up Plan:  Additional outreach attempts will be made to offer the patient complex care management information and services.   Encounter Outcome:  No Answer   Care Coordination Interventions:  No, not indicated    Lonzo Candy, BSN, [facility]  [facility], [facility]  Care Coordinator Phone: [phone]

## 2023-03-03 NOTE — Transitions of Care (Post Inpatient/ED Visit) (Signed)
   03/03/2023  Name: [name] MRN: [id number] DOB: Mar 09, 1960  Today's TOC FU Call Status: Today's TOC FU Call Status:: Unsuccessful Call (3rd Attempt) Unsuccessful Call (1st Attempt) Date: 02/28/23 Unsuccessful Call (3rd Attempt) Date: 03/03/23  Attempted to reach the patient regarding the most recent Inpatient/ED visit.  Follow Up Plan: No further outreach attempts will be made at this time. We have been unable to contact the patient.  Signature Karena Addison, LPN [facility] Nurse Health Advisor Direct Dial [phone]

## 2023-03-04 ENCOUNTER — Telehealth: Payer: Self-pay | Admitting: *Deleted

## 2023-03-04 DIAGNOSIS — N186 End stage renal disease: Secondary | ICD-10-CM | POA: Diagnosis not present

## 2023-03-04 DIAGNOSIS — Z992 Dependence on renal dialysis: Secondary | ICD-10-CM | POA: Diagnosis not present

## 2023-03-04 DIAGNOSIS — E1122 Type 2 diabetes mellitus with diabetic chronic kidney disease: Secondary | ICD-10-CM | POA: Diagnosis not present

## 2023-03-04 NOTE — Telephone Encounter (Signed)
 Call from Will, OT [facility].  Wanted to let the doctors know that they have been unable to get to speak with the patient per phone to set up an appointment for the OT evaluation Need to see patient to evaluate needs.  Will try and reach patient next week.

## 2023-03-09 ENCOUNTER — Telehealth: Payer: Self-pay | Admitting: *Deleted

## 2023-03-09 NOTE — Telephone Encounter (Signed)
 Summary  have not gotten any response from [facility]  internal medicine center - need approval  Communication  Caller/Agency: [facility] / [facility] Number: [phone]    Service Requested: Physical Therapy / nurse evaluate - for diabetes teaching and medication management    Frequency:  PT 1 time a week ,for 6 weeks    Any new concerns about the patient? No    If possible if can get a callback today

## 2023-03-09 NOTE — Telephone Encounter (Signed)
 Return call to [facility] PT - stated eval was done last week. Requesting verbal order for "PT once a week x 6 weeks;and nurse eval for diabetes teaching and medication management." VO given - sending to [employer] team for approval or denial. Also pt has an appt tomorrow 03/10/23.

## 2023-03-10 ENCOUNTER — Encounter: Admitting: Student

## 2023-03-12 ENCOUNTER — Encounter (HOSPITAL_COMMUNITY): Payer: Self-pay

## 2023-03-12 ENCOUNTER — Other Ambulatory Visit: Payer: Self-pay

## 2023-03-12 ENCOUNTER — Emergency Department (HOSPITAL_COMMUNITY)
Admission: EM | Admit: 2023-03-12 | Discharge: 2023-03-12 | Disposition: A | Discharge status: Home / Self Care | Attending: Emergency Medicine | Admitting: Emergency Medicine

## 2023-03-12 ENCOUNTER — Emergency Department (HOSPITAL_COMMUNITY)

## 2023-03-12 DIAGNOSIS — M62549 Muscle wasting and atrophy, not elsewhere classified, unspecified hand: Secondary | ICD-10-CM | POA: Diagnosis not present

## 2023-03-12 DIAGNOSIS — I132 Hypertensive heart and chronic kidney disease with heart failure and with stage 5 chronic kidney disease, or end stage renal disease: Secondary | ICD-10-CM | POA: Insufficient documentation

## 2023-03-12 DIAGNOSIS — I509 Heart failure, unspecified: Secondary | ICD-10-CM | POA: Diagnosis not present

## 2023-03-12 DIAGNOSIS — Z79899 Other long term (current) drug therapy: Secondary | ICD-10-CM | POA: Diagnosis not present

## 2023-03-12 DIAGNOSIS — E119 Type 2 diabetes mellitus without complications: Secondary | ICD-10-CM | POA: Insufficient documentation

## 2023-03-12 DIAGNOSIS — Z794 Long term (current) use of insulin: Secondary | ICD-10-CM | POA: Diagnosis not present

## 2023-03-12 DIAGNOSIS — Z992 Dependence on renal dialysis: Secondary | ICD-10-CM | POA: Diagnosis not present

## 2023-03-12 DIAGNOSIS — M79643 Pain in unspecified hand: Secondary | ICD-10-CM | POA: Diagnosis present

## 2023-03-12 DIAGNOSIS — Z7982 Long term (current) use of aspirin: Secondary | ICD-10-CM | POA: Diagnosis not present

## 2023-03-12 DIAGNOSIS — N186 End stage renal disease: Secondary | ICD-10-CM | POA: Insufficient documentation

## 2023-03-12 LAB — COMPREHENSIVE METABOLIC PANEL
ALT: 13 U/L (ref 0–44)
AST: 16 U/L (ref 15–41)
Albumin: 3.4 g/dL — ABNORMAL LOW (ref 3.5–5.0)
Alkaline Phosphatase: 59 U/L (ref 38–126)
Anion gap: 13 (ref 5–15)
BUN: 31 mg/dL — ABNORMAL HIGH (ref 8–23)
CO2: 21 mmol/L — ABNORMAL LOW (ref 22–32)
Calcium: 8.4 mg/dL — ABNORMAL LOW (ref 8.9–10.3)
Chloride: 104 mmol/L (ref 98–111)
Creatinine, Ser: 4.79 mg/dL — ABNORMAL HIGH (ref 0.61–1.24)
GFR, Estimated: 13 mL/min — ABNORMAL LOW (ref >60)
Glucose, Bld: 148 mg/dL — ABNORMAL HIGH (ref 70–99)
Potassium: 4 mmol/L (ref 3.5–5.1)
Sodium: 138 mmol/L (ref 135–145)
Total Bilirubin: 0.2 mg/dL (ref 0.0–1.2)
Total Protein: 6.8 g/dL (ref 6.5–8.1)

## 2023-03-12 LAB — CBC WITH DIFFERENTIAL/PLATELET
Abs Immature Granulocytes: 0.03 10*3/uL (ref 0.00–0.07)
Basophils Absolute: 0 10*3/uL (ref 0.0–0.1)
Basophils Relative: 0 %
Eosinophils Absolute: 0.2 10*3/uL (ref 0.0–0.5)
Eosinophils Relative: 2 %
HCT: 34 % — ABNORMAL LOW (ref 39.0–52.0)
Hemoglobin: 11 g/dL — ABNORMAL LOW (ref 13.0–17.0)
Immature Granulocytes: 0 %
Lymphocytes Relative: 20 %
Lymphs Abs: 1.4 10*3/uL (ref 0.7–4.0)
MCH: 30.9 pg (ref 26.0–34.0)
MCHC: 32.4 g/dL (ref 30.0–36.0)
MCV: 95.5 fL (ref 80.0–100.0)
Monocytes Absolute: 0.4 10*3/uL (ref 0.1–1.0)
Monocytes Relative: 6 %
Neutro Abs: 5.2 10*3/uL (ref 1.7–7.7)
Neutrophils Relative %: 72 %
Platelets: 166 10*3/uL (ref 150–400)
RBC: 3.56 MIL/uL — ABNORMAL LOW (ref 4.22–5.81)
RDW: 14.3 % (ref 11.5–15.5)
WBC: 7.2 10*3/uL (ref 4.0–10.5)
nRBC: 0 % (ref 0.0–0.2)

## 2023-03-12 MED ORDER — MORPHINE SULFATE (PF) 4 MG/ML IV SOLN
4.0000 mg | Freq: Once | INTRAVENOUS | Status: DC
  Filled 2023-03-12: qty 1

## 2023-03-12 MED ORDER — OXYCODONE-ACETAMINOPHEN 5-325 MG PO TABS
1.0000 | ORAL_TABLET | Freq: Once | ORAL | Status: AC
  Administered 2023-03-12: 1 via ORAL
  Filled 2023-03-12: qty 1

## 2023-03-12 MED ORDER — MORPHINE SULFATE (PF) 4 MG/ML IV SOLN
4.0000 mg | Freq: Once | INTRAVENOUS | Status: AC
  Administered 2023-03-12: 4 mg via INTRAVENOUS
  Filled 2023-03-12: qty 1

## 2023-03-12 MED ORDER — CALCIUM GLUCONATE-NACL 1-0.675 GM/50ML-% IV SOLN
1.0000 g | Freq: Once | INTRAVENOUS | Status: AC
  Administered 2023-03-12: 1000 mg via INTRAVENOUS
  Filled 2023-03-12: qty 50

## 2023-03-12 NOTE — ED Triage Notes (Signed)
 Pt states he went to dialysis and once the bp cuff was inflating his hand started to swell and become painful. Pt states they switched the cuff to the other side and it did the same thing to that hand. Pt states last went to dialysis was last Sat. Pt did not get dialysis today. Pt states he can't straighten out his hands.

## 2023-03-12 NOTE — ED Provider Triage Note (Signed)
 Emergency Medicine Provider Triage Evaluation Note  [name] , a 63 y.o. male  was evaluated in triage.  Pt complains of hand pain.  Patient reports pain and swelling to the bilateral palms since getting his blood pressure taken at dialysis.  States that the pain has been persistent since last dialysis week.  States that he missed dialysis today to come here for hand pain evaluation.  Review of Systems  Positive: Hand pain Negative:   Physical Exam  BP (!) 156/117 (BP Location: Right Arm)   Pulse 80   Temp 97.7 F (36.5 C)   Resp 15   Ht 4\' 7"  (1.397 m)   Wt 63 kg   SpO2 100%   BMI 32.28 kg/m  Gen:   Awake, no distress   Resp:  Normal effort  MSK:   Moves extremities without difficulty  Other:  Tenderness to palpation of the palmar aspect of the left and right palm  Medical Decision Making  Medically screening exam initiated at 11:19 AM.  Appropriate orders placed.  Ayomikun Starling was informed that the remainder of the evaluation will be completed by another provider, this initial triage assessment does not replace that evaluation, and the importance of remaining in the ED until their evaluation is complete.    Maxwell Marion, PA-C 03/12/23 1120

## 2023-03-12 NOTE — ED Provider Notes (Signed)
 [place] EMERGENCY DEPARTMENT AT [facility] Provider Note   CSN: [id number] Arrival date & time: 03/12/23  1101     History  Chief Complaint  Patient presents with   Hand Pain    [name] is a 63 y.o. male.  Pt is 63 yo male with pmhx significant for ESRD on HD (Tu, Th, Sat), HLD, HTN, DM2, osteomyelitis, CHF, abd GERD.  Pt went to dialysis today and had his bp checked.  When he was getting it checked, his hand spasmed.  He said they checked the other arm and the other hand spasmed.  He still feels like it's spasmed and it is painful.  He was sent here for further eval.       Home Medications Prior to Admission medications   Medication Sig Start Date End Date Taking? Authorizing Provider  Accu-Chek Softclix Lancets lancets Use to check blood sugar 3 (three) times daily. 12/22/22   Lovie Macadamia, MD  aspirin EC 81 MG tablet Take 1 tablet (81 mg total) by mouth daily. Swallow whole. 12/22/22   Lovie Macadamia, MD  Blood Glucose Monitoring Suppl (ACCU-CHEK GUIDE) w/Device KIT Use to check your blood sugar three times daily. 12/23/22   Lovie Macadamia, MD  carvedilol (COREG) 6.25 MG tablet Take 1 tablet (6.25 mg total) by mouth 2 (two) times daily with meals for hypertension 12/22/22   Lovie Macadamia, MD  diclofenac Sodium (VOLTAREN) 1 % GEL Apply 4 g topically 4 (four) times daily. 06/29/22   Rocky Morel, DO  DULoxetine (CYMBALTA) 60 MG capsule Take 1 capsule (60 mg total) by mouth daily. 06/29/22   Rocky Morel, DO  ezetimibe (ZETIA) 10 MG tablet Take 1 tablet (10 mg total) by mouth daily. 12/22/22   Lovie Macadamia, MD  glucose blood (ACCU-CHEK AVIVA PLUS) test strip Check blood sugar 3 times a day 12/22/22   Lovie Macadamia, MD  insulin glargine (LANTUS) 100 UNIT/ML Solostar Pen Inject 15 Units into the skin at bedtime. Please ship to patient. Thank you 12/22/22   Lovie Macadamia, MD  insulin lispro (HUMALOG) 100 UNIT/ML KwikPen Inject 5  Units into the skin 3 (three) times daily with meals. 06/28/22   Katsadouros, Vasilios, MD  Insulin Pen Needle 32G X 4 MM MISC Use to inject insulin 4 (four) times daily. 12/22/22   Lovie Macadamia, MD  melatonin (KP MELATONIN) 3 MG TABS tablet Take 1 tablet (3 mg total) by mouth at bedtime for insomnia. 09/10/21     pregabalin (LYRICA) 25 MG capsule Take 1 capsule by mouth once a day 12/22/22   Lovie Macadamia, MD  rosuvastatin (CRESTOR) 10 MG tablet Take 1 tablet (10 mg total) by mouth daily. 12/22/22   Lovie Macadamia, MD      Allergies    Benicar [olmesartan]    Review of Systems   Review of Systems  Musculoskeletal:        Bilateral hand pain  All other systems reviewed and are negative.   Physical Exam Updated Vital Signs BP (!) 156/117 (BP Location: Right Arm)   Pulse 80   Temp 97.7 F (36.5 C)   Resp 15   Ht 4\' 7"  (1.397 m)   Wt 63 kg   SpO2 100%   BMI 32.28 kg/m  Physical Exam Vitals and nursing note reviewed.  Constitutional:      Appearance: Normal appearance.  HENT:     Head: Normocephalic and atraumatic.     Right Ear: External ear normal.  Left Ear: External ear normal.     Nose: Nose normal.     Mouth/Throat:     Mouth: Mucous membranes are moist.     Pharynx: Oropharynx is clear.  Eyes:     Extraocular Movements: Extraocular movements intact.     Conjunctiva/sclera: Conjunctivae normal.     Pupils: Pupils are equal, round, and reactive to light.  Cardiovascular:     Rate and Rhythm: Normal rate and regular rhythm.     Pulses: Normal pulses.     Heart sounds: Normal heart sounds.  Pulmonary:     Effort: Pulmonary effort is normal.     Breath sounds: Normal breath sounds.  Abdominal:     General: Abdomen is flat. Bowel sounds are normal.     Palpations: Abdomen is soft.  Musculoskeletal:     Cervical back: Normal range of motion and neck supple.     Comments: Left AKA, Right BKA  Carpal spasm of both hands with some wasting of his  hypothenar eminence  Skin:    General: Skin is warm.     Capillary Refill: Capillary refill takes less than 2 seconds.  Neurological:     General: No focal deficit present.     Mental Status: He is alert and oriented to person, place, and time.  Psychiatric:        Mood and Affect: Mood normal.        Behavior: Behavior normal.     ED Results / Procedures / Treatments   Labs (all labs ordered are listed, but only abnormal results are displayed) Labs Reviewed  CBC WITH DIFFERENTIAL/PLATELET - Abnormal; Notable for the following components:      Result Value   RBC 3.56 (*)    Hemoglobin 11.0 (*)    HCT 34.0 (*)    All other components within normal limits  COMPREHENSIVE METABOLIC PANEL - Abnormal; Notable for the following components:   CO2 21 (*)    Glucose, Bld 148 (*)    BUN 31 (*)    Creatinine, Ser 4.79 (*)    Calcium 8.4 (*)    Albumin 3.4 (*)    GFR, Estimated 13 (*)    All other components within normal limits  25-HYDROXY VITAMIN D LCMS D2+D3  PARATHYROID HORMONE, INTACT (NO CA)    EKG EKG Interpretation Date/Time:  Saturday March 12 2023 11:24:05 EST Ventricular Rate:  80 PR Interval:  136 QRS Duration:  86 QT Interval:  420 QTC Calculation: 484 R Axis:   18  Text Interpretation: Normal sinus rhythm ST & T wave abnormality, consider lateral ischemia Prolonged QT Abnormal ECG When compared with ECG of 03-Feb-2023 16:10, PREVIOUS ECG IS PRESENT Confirmed by Jacalyn Lefevre [phone]) on 03/12/2023 12:38:01 PM  Radiology DG Hand Complete Right Result Date: 03/12/2023 CLINICAL DATA:  Patient reports pain and swelling 2 bilateral hand since getting his blood pressure taken at dialysis. EXAM: RIGHT HAND - COMPLETE 3 VIEW COMPARISON:  X-ray 06/23/2022. FINDINGS: Limited x-rays as the fingers are flexed on all views and overlapping on the lateral view. Chronic deformity of the midshaft of the fifth metacarpal, unchanged from previous. There are areas of small osteophytes  along the interphalangeal joints. No fracture or dislocation. Preserved bone mineralization. Artifact about the wrist from wrist band. Vascular calcifications. IMPRESSION: Limited radiographs. Chronic changes. No acute osseous abnormality. Electronically Signed   By: Karen Kays M.D.   On: 03/12/2023 12:49   DG Hand Complete Left Result Date: 03/12/2023 CLINICAL DATA:  Patient reports pain and swelling to of both hands since getting his blood pressure taken and dialysis. EXAM: LEFT HAND - COMPLETE 3 VIEW COMPARISON:  None Available. FINDINGS: Fingers are flexed on all views particularly the fifth digit limiting evaluation. Is also overlapping of the digits on the lateral view. No obvious fracture or dislocation. Preserved joint spaces and bone mineralization. Scattered vascular calcifications. IMPRESSION: Limited x-rays. Grossly no acute osseous abnormality. Vascular calcifications. Electronically Signed   By: Karen Kays M.D.   On: 03/12/2023 12:47    Procedures Procedures    Medications Ordered in ED Medications  oxyCODONE-acetaminophen (PERCOCET/ROXICET) 5-325 MG per tablet 1 tablet (1 tablet Oral Given 03/12/23 1122)  calcium gluconate 1 g/ 50 mL sodium chloride IVPB (1,000 mg Intravenous New Bag/Given 03/12/23 1325)  morphine (PF) 4 MG/ML injection 4 mg (4 mg Intravenous Given 03/12/23 1318)    ED Course/ Medical Decision Making/ A&P                                 Medical Decision Making Amount and/or Complexity of Data Reviewed Labs: ordered. ECG/medicine tests: ordered.  Risk Prescription drug management.   This patient presents to the ED for concern of hand pain, this involves an extensive number of treatment options, and is a complaint that carries with it a high risk of complications and morbidity.  The differential diagnosis includes hypocalcemia, other electrolyte abn, pvd, nerve problem   Co morbidities that complicate the patient evaluation  ESRD on HD (Tu, Th, Sat), HLD,  HTN, DM2, osteomyelitis, CHF, abd GERD   Additional history obtained:  Additional history obtained from epic chart review  Lab Tests:  I Ordered, and personally interpreted labs.  The pertinent results include:  cbc with hgb 11 (stable); cmp with bun 31 and cr 4.79, ca low at 8.4   Imaging Studies ordered:  I ordered imaging studies including bilat hands  I independently visualized and interpreted imaging which showed  R hand: Limited radiographs. Chronic changes. No acute osseous abnormality.  L hand: Limited x-rays. Grossly no acute osseous abnormality. Vascular  calcifications.   I agree with the radiologist interpretation   Cardiac Monitoring:  The patient was maintained on a cardiac monitor.  I personally viewed and interpreted the cardiac monitored which showed an underlying rhythm of: nsr   Medicines ordered and prescription drug management:  I ordered medication including calcium  for sx  Reevaluation of the patient after these medicines showed that the patient improved I have reviewed the patients home medicines and have made adjustments as needed   Critical Interventions:  Calcium  Problem List / ED Course:  Trousseau's sign and hypocalcemia:  pt's hands feel better after calcium.  He is stable for d/c.  Return if worse.  F/u hand/nephrology   Reevaluation:  After the interventions noted above, I reevaluated the patient and found that they have :improved   Social Determinants of Health:  Lives at home   Dispostion:  After consideration of the diagnostic results and the patients response to treatment, I feel that the patent would benefit from discharge with outpatient f/u.          Final Clinical Impression(s) / ED Diagnoses Final diagnoses:  Hypocalcemia  ESRD on hemodialysis (HCC)  Hypothenar muscle atrophy, unspecified laterality    Rx / DC Orders ED Discharge Orders     None         Jacalyn Lefevre,  MD 03/12/23 1457

## 2023-03-14 ENCOUNTER — Telehealth: Payer: Self-pay | Admitting: *Deleted

## 2023-03-14 NOTE — Telephone Encounter (Signed)
 Copied from CRM [id number]. Topic: Clinical - Home Health Verbal Orders >> Mar 11, 2023  2:58 PM Alfred Levins wrote: Caller/Agency: Heather with [facility] Callback Number: [phone] Service Requested: Skilled Nursing  Any new concerns about the patient? Yes   Missed visit for today. Patient did not answer call on 03/10/23 and today no answer no return call

## 2023-03-15 LAB — PARATHYROID HORMONE, INTACT (NO CA): PTH: 76 pg/mL — ABNORMAL HIGH (ref 15–65)

## 2023-03-16 ENCOUNTER — Other Ambulatory Visit (HOSPITAL_COMMUNITY): Payer: Self-pay

## 2023-03-16 ENCOUNTER — Telehealth: Payer: Self-pay | Admitting: *Deleted

## 2023-03-16 NOTE — Progress Notes (Signed)
 Complex Care Management Care Guide Note  03/16/2023 Name: Kamaron Deskins MRN: [id number] DOB: 1960/05/20  Avory Mimbs is a 63 y.o. year old male who is a primary care patient of Morrie Sheldon, MD and is actively engaged with the care management team. I reached out to Vivien Presto by phone today to assist with re-scheduling  with the RN Case Manager.  Follow up plan: Unsuccessful telephone outreach attempt made. A HIPAA compliant phone message was left for the patient providing contact information and requesting a return call.  Gwenevere Ghazi  [facility]  [facility], [facility] Guide  Direct Dial: [phone]  Fax [phone]

## 2023-03-18 ENCOUNTER — Emergency Department (HOSPITAL_COMMUNITY)
Admission: EM | Admit: 2023-03-18 | Discharge: 2023-03-19 | Discharge status: Against Medical Advice | Attending: Emergency Medicine | Admitting: Emergency Medicine

## 2023-03-18 ENCOUNTER — Other Ambulatory Visit: Payer: Self-pay

## 2023-03-18 ENCOUNTER — Encounter (HOSPITAL_COMMUNITY): Payer: Self-pay | Admitting: *Deleted

## 2023-03-18 DIAGNOSIS — N186 End stage renal disease: Secondary | ICD-10-CM | POA: Diagnosis not present

## 2023-03-18 DIAGNOSIS — M791 Myalgia, unspecified site: Secondary | ICD-10-CM | POA: Insufficient documentation

## 2023-03-18 DIAGNOSIS — Z5321 Procedure and treatment not carried out due to patient leaving prior to being seen by health care provider: Secondary | ICD-10-CM | POA: Insufficient documentation

## 2023-03-18 DIAGNOSIS — E1122 Type 2 diabetes mellitus with diabetic chronic kidney disease: Secondary | ICD-10-CM | POA: Diagnosis not present

## 2023-03-18 DIAGNOSIS — Z992 Dependence on renal dialysis: Secondary | ICD-10-CM | POA: Insufficient documentation

## 2023-03-18 LAB — CBC
HCT: 34.7 % — ABNORMAL LOW (ref 39.0–52.0)
Hemoglobin: 11.3 g/dL — ABNORMAL LOW (ref 13.0–17.0)
MCH: 31.2 pg (ref 26.0–34.0)
MCHC: 32.6 g/dL (ref 30.0–36.0)
MCV: 95.9 fL (ref 80.0–100.0)
Platelets: 173 10*3/uL (ref 150–400)
RBC: 3.62 MIL/uL — ABNORMAL LOW (ref 4.22–5.81)
RDW: 13.9 % (ref 11.5–15.5)
WBC: 8.2 10*3/uL (ref 4.0–10.5)
nRBC: 0 % (ref 0.0–0.2)

## 2023-03-18 LAB — BASIC METABOLIC PANEL
Anion gap: 10 (ref 5–15)
BUN: 39 mg/dL — ABNORMAL HIGH (ref 8–23)
CO2: 19 mmol/L — ABNORMAL LOW (ref 22–32)
Calcium: 8.3 mg/dL — ABNORMAL LOW (ref 8.9–10.3)
Chloride: 109 mmol/L (ref 98–111)
Creatinine, Ser: 5.14 mg/dL — ABNORMAL HIGH (ref 0.61–1.24)
GFR, Estimated: 12 mL/min — ABNORMAL LOW (ref >60)
Glucose, Bld: 149 mg/dL — ABNORMAL HIGH (ref 70–99)
Potassium: 4.3 mmol/L (ref 3.5–5.1)
Sodium: 138 mmol/L (ref 135–145)

## 2023-03-18 MED ORDER — OXYCODONE-ACETAMINOPHEN 5-325 MG PO TABS: 1.0000 | ORAL_TABLET | Freq: Once | ORAL | Status: DC

## 2023-03-18 NOTE — ED Triage Notes (Signed)
 The pt is c/o bi-lateral arm pain for 2 months  bi-lateral leg amputations  pain surgery performed one year ago  he was just here in the past week for the same

## 2023-03-18 NOTE — ED Provider Triage Note (Signed)
 Emergency Medicine Provider Triage Evaluation Note  [name] , a 63 y.o. male  was evaluated in triage.  Pt complains of pain all over.  Reports he is ESRD, goes on Tuesday Thursday Saturday.  States he has not gone to dialysis in 2 weeks.  States he is not gone due to getting in arguments with facility staff.  Also reporting pain all over.  History of diabetes and diabetic neuropathy.  Review of Systems  Positive:  Negative:   Physical Exam  BP (!) 176/95 (BP Location: Right Arm)   Pulse 83   Temp 98.2 F (36.8 C)   Resp 18   Ht 4\' 7"  (1.397 m)   Wt 63 kg   SpO2 100%   BMI 32.28 kg/m  Gen:   Awake, no distress   Resp:  Normal effort  MSK:   Moves extremities without difficulty  Other:    Medical Decision Making  Medically screening exam initiated at 6:34 PM.  Appropriate orders placed.  [name] was informed that the remainder of the evaluation will be completed by another provider, this initial triage assessment does not replace that evaluation, and the importance of remaining in the ED until their evaluation is complete.     Al Decant, PA-C 03/18/23 1834

## 2023-03-19 ENCOUNTER — Telehealth: Payer: Self-pay

## 2023-03-19 NOTE — Telephone Encounter (Signed)
 Patients sister Ms Paschal Dopp called in to state that his dialysis center asked for "clearance paperwork" from the ED. Called [facility] where patient goes [phone] They stated they wanted the evaluation and labs from his visit. Faxed them over to [phone]. Called Mary back to let her know that paperwork/MD evaluation was faxed.

## 2023-03-21 LAB — 25-HYDROXY VITAMIN D LCMS D2+D3
25-Hydroxy, Vitamin D-2: <1 ng/mL
25-Hydroxy, Vitamin D-3: 5.8 ng/mL
25-Hydroxy, Vitamin D: 5.8 ng/mL — ABNORMAL LOW

## 2023-03-22 ENCOUNTER — Ambulatory Visit (HOSPITAL_COMMUNITY)
Admission: RE | Admit: 2023-03-22 | Discharge: 2023-03-22 | Disposition: A | Discharge status: Home / Self Care | Source: Ambulatory Visit | Attending: Vascular Surgery | Admitting: Vascular Surgery

## 2023-03-22 ENCOUNTER — Ambulatory Visit (HOSPITAL_COMMUNITY)
Admission: RE | Admit: 2023-03-22 | Discharge: 2023-03-22 | Disposition: A | Discharge status: Home / Self Care | Source: Ambulatory Visit | Attending: Vascular Surgery

## 2023-03-22 DIAGNOSIS — Z992 Dependence on renal dialysis: Secondary | ICD-10-CM | POA: Insufficient documentation

## 2023-03-22 DIAGNOSIS — N186 End stage renal disease: Secondary | ICD-10-CM | POA: Diagnosis not present

## 2023-03-24 ENCOUNTER — Ambulatory Visit (INDEPENDENT_AMBULATORY_CARE_PROVIDER_SITE_OTHER): Admitting: Vascular Surgery

## 2023-03-24 ENCOUNTER — Encounter: Payer: Self-pay | Admitting: Vascular Surgery

## 2023-03-24 VITALS — BP 175/82 | HR 81 | Temp 98.4°F | Resp 18 | Ht <= 58 in | Wt 138.0 lb

## 2023-03-24 DIAGNOSIS — N186 End stage renal disease: Secondary | ICD-10-CM

## 2023-03-24 DIAGNOSIS — Z992 Dependence on renal dialysis: Secondary | ICD-10-CM

## 2023-03-24 NOTE — Progress Notes (Signed)
 Complex Care Management Care Guide Note  03/24/2023 Name: Rodrick Payson MRN: [id number] DOB: 03-Aug-1960  Jun Osment is a 63 y.o. year old male who is a primary care patient of Morrie Sheldon, MD and is actively engaged with the care management team. I reached out to Vivien Presto by phone today to assist with re-scheduling  with the RN Case Manager.  Follow up plan: Unsuccessful telephone outreach attempt made. A HIPAA compliant phone message was left for the patient providing contact information and requesting a return call.No further outreach attempts will be made at this time. We have been unable to contact the patient to reschedule for complex care management services.   Gwenevere Ghazi  [facility]  [facility], [facility] Guide  Direct Dial: [phone]  Fax [phone]

## 2023-03-24 NOTE — Progress Notes (Unsigned)
 OFFICE NOTE    HPI:  [name] is a 63 y.o. male who is here to discuss HD access.   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   During his hospitalization on 08/12/2021, the patient started developing gangrenous changes to bilateral fingertips, L>R. He has a history of left ulnar basilic AV fistula creation by Dr. Lenell Antu on 07/02/2021.  Work-up in the hospital did not reveal a central source to the patient's bilateral fingertip ulcerations.  He underwent bilateral upper extremity arteriograms and left AV fistula ligation on 08/18/2021.  Bilateral arteriograms showed small vessels through the wrist and hands, but no correctable hemodynamically significant stenosis.  Patient was able to heal wounds on bilateral hands.  He presents today to discuss HD access accompanied by his sister.  Currently being dialyzed through a right sided IJ tunneled line.  He is noncompliant with dialysis, and misses frequently.  A meeting is set up with nephrology to discuss goals of care moving forward.  Regarding the hands, they remain numb and cold.  He states that the hands get locked in the flexed position during dialysis, especially with blood pressure cuff inflation.  He is currently dialyzing on TTS via right IJ TDC, placed by IR. Allergies  Allergen Reactions   Benicar [Olmesartan] Cough    Current Outpatient Medications  Medication Sig Dispense Refill   Accu-Chek FastClix Lancets MISC Check blood sugar 3 (three) times daily. 102 each 12   acetaminophen (TYLENOL) 325 MG tablet Take 2 tablets (650 mg total) by mouth every 4 (four) hours as needed for mild pain or moderate pain.     acetaminophen (TYLENOL) 650 MG CR tablet Take 2 tablets (1,300 mg total) by mouth every 6 (six) hours as needed for pain 240 tablet 0   amLODipine (NORVASC) 10 MG tablet Take 1 tablet (10 mg  total) by mouth daily. (Patient not taking: Reported on 08/10/2021) 90 tablet 2   amLODipine (NORVASC) 10 MG tablet Take 1 tablet (10 mg total) by mouth daily for hypertension 30 tablet 0   aspirin EC 81 MG tablet Take 1 tablet (81 mg total) by mouth daily. Swallow whole. (Patient not taking: Reported on 07/02/2021) 30 tablet 0   aspirin EC 81 MG tablet Take 1 tablet (81 mg total) by mouth daily. 30 tablet 0   blood glucose meter kit and supplies KIT Dispense based on patient and insurance preference. Use up to four times daily as directed. (FOR ICD-9 250.00, 250.01). 1 each 0   Blood Glucose Monitoring Suppl (ACCU-CHEK AVIVA PLUS) w/Device KIT Check blood sugar 3 times a day 1 kit 0   Blood Glucose Monitoring Suppl (BLOOD GLUCOSE MONITOR SYSTEM) w/Device KIT Use to check blood sugar 3 (three) times daily. 1 kit 0   carvedilol (COREG) 6.25 MG tablet Take 1 tablet (6.25 mg total) by mouth 2 (two) times daily with a meal. (Patient not taking: Reported on 08/10/2021) 180 tablet 1   carvedilol (COREG) 6.25 MG tablet Take 1 tablet (6.25 mg total) by mouth 2 (two) times daily with meals for hypertension 60 tablet 0   cetirizine (ZYRTEC ALLERGY) 10 MG tablet Take 1 tablet (10 mg total) by mouth daily. (Patient not taking: Reported on 07/02/2021) 30 tablet 2   cetirizine (ZYRTEC) 10 MG tablet Take 1 tablet (10 mg total) by mouth daily for  allergic rhinitis 30 tablet 0   Continuous Blood Gluc Sensor (FREESTYLE LIBRE 2 SENSOR) MISC Use as directed every 14 days 2 each 0   Continuous Blood Gluc Sensor (FREESTYLE LIBRE 2 SENSOR) MISC Place 1 sensor on the skin every 14 days. Use to check glucose continuously 2 each 3   Darbepoetin Alfa (ARANESP) 100 MCG/0.5ML SOSY injection Inject 0.5 mLs (100 mcg total) into the vein every Wednesday with hemodialysis. 4.2 mL    DULoxetine (CYMBALTA) 30 MG capsule Take 2 capsules (60 mg total) by mouth daily. (Patient not taking: Reported on 08/10/2021) 180 capsule 1   DULoxetine  (CYMBALTA) 60 MG capsule Take 1 capsule (60 mg total) by mouth daily for depression (Do NOT CRUSH) 30 capsule 0   ezetimibe (ZETIA) 10 MG tablet Take 1 tablet (10 mg total) by mouth daily. (Patient not taking: Reported on 08/10/2021) 30 tablet 0   ezetimibe (ZETIA) 10 MG tablet Take 1 tablet (10 mg total) by mouth daily. 30 tablet 0   fluticasone (FLONASE) 50 MCG/ACT nasal spray Place 1 spray into both nostrils daily. (Patient not taking: Reported on 08/10/2021) 16 g 0   fluticasone (FLONASE) 50 MCG/ACT nasal spray Place 1 spray into both nostrils daily for allergic rhinitis 16 g 0   glucose blood (ACCU-CHEK AVIVA PLUS) test strip Check blood sugar 3 times a day 100 each 12   insulin aspart (NOVOLOG) 100 UNIT/ML FlexPen Inject 11 Units into the skin 3 (three) times daily with meals. (Patient not taking: Reported on 08/10/2021) 15 mL 4   insulin aspart (NOVOLOG) 100 UNIT/ML FlexPen Inject 5 Units into the skin 3 (three) times daily with meals. 15 mL 4   insulin glargine (LANTUS) 100 UNIT/ML Solostar Pen Inject 16 Units into the skin at bedtime. (PRIME PEN WITH 2 UNITS PRIOR TO EACH USE) 3 mL 0   insulin glargine-yfgn (SEMGLEE) 100 UNIT/ML Pen Inject 10 Units into the skin at bedtime. 3 mL 3   Insulin Pen Needle 32G X 4 MM MISC Use to inject insulin 4 (four) times daily. 360 each 3   isosorbide mononitrate (IMDUR) 30 MG 24 hr tablet Take 0.5 tablets (15 mg total) by mouth daily for ischemic cardiomyopathy 15 tablet 0   melatonin (KP MELATONIN) 3 MG TABS tablet Take 1 tablet (3 mg total) by mouth at bedtime for insomnia. 30 tablet 0   oxyCODONE (OXY IR/ROXICODONE) 5 MG immediate release tablet Take 1 tablet (5 mg total) by mouth every 6 (six) hours as needed for pain 120 tablet 0   pantoprazole (PROTONIX) 40 MG tablet Take 1 tablet by mouth once a day 30 tablet 11   pantoprazole (PROTONIX) 40 MG tablet Take 1 tablet (40 mg total) by mouth daily for GERD. 30 tablet 0   polyethylene glycol powder  (GLYCOLAX/MIRALAX) 17 GM/SCOOP powder Take 17 g by mouth daily. 510 g 0   pregabalin (LYRICA) 25 MG capsule Take 1 capsule (25 mg total) by mouth daily. (Patient not taking: Reported on 08/10/2021) 90 capsule 1   pregabalin (LYRICA) 25 MG capsule Take 1 capsule (25 mg total) by mouth daily. 30 capsule 0   rosuvastatin (CRESTOR) 10 MG tablet Take 1 tablet (10 mg total) by mouth daily. (Patient not taking: Reported on 08/10/2021) 30 tablet 0   rosuvastatin (CRESTOR) 10 MG tablet Take 1 tablet (10 mg total) by mouth daily for HLD 30 tablet 0   senna-docusate (SENEXON-S) 8.6-50 MG tablet Take 1 tablet by mouth at bedtime for constipation 30  tablet 0   senna-docusate (SENOKOT-S) 8.6-50 MG tablet Take 1 tablet by mouth at bedtime. (Patient not taking: Reported on 08/10/2021) 12 tablet 0   No current facility-administered medications for this visit.     ROS:  See HPI  Physical Exam:  Extremities: No gangrenous changes to fingertips/hands bilaterally.  Nonpalpable pulses. Left-sided AKA, right sided BKA Neuro: Intact motor and sensation.  Grip strength 5 out of 5 bilaterally   Assessment/Plan:  This is a 63 y.o. male who is s/p: Bilateral upper extremity arteriogram and ligation of left AV fistula on 08/18/2021   Patient continues to have numbness and tingling to bilateral hands.  No revascularization options.  I do not think he is a candidate for upper extremity fistula.    I evaluated his lower extremities-left sided AKA, right sided BKA.  He was in a wheelchair, and could not mobilize to the exam table.  I did not have palpable pulses in the femoral arteries in the sitting position.  I had a long conversation with both he and his sister regarding the above.  I think that he would be best treated with continued tunneled dialysis catheter.  Alvie felt the same-he did not want any surgery moving forward. I discussed this with nephrology who is planning to have a goals of care discussion due to  noncompliance  Victorino Sparrow MD

## 2023-03-26 ENCOUNTER — Emergency Department (HOSPITAL_COMMUNITY)
Admission: EM | Admit: 2023-03-26 | Discharge: 2023-03-26 | Discharge status: Against Medical Advice | Attending: Emergency Medicine | Admitting: Emergency Medicine

## 2023-03-26 ENCOUNTER — Other Ambulatory Visit: Payer: Self-pay

## 2023-03-26 ENCOUNTER — Encounter (HOSPITAL_COMMUNITY): Payer: Self-pay | Admitting: Emergency Medicine

## 2023-03-26 ENCOUNTER — Emergency Department (HOSPITAL_COMMUNITY)

## 2023-03-26 DIAGNOSIS — H538 Other visual disturbances: Secondary | ICD-10-CM | POA: Diagnosis present

## 2023-03-26 DIAGNOSIS — Z5329 Procedure and treatment not carried out because of patient's decision for other reasons: Secondary | ICD-10-CM | POA: Insufficient documentation

## 2023-03-26 LAB — CBC WITH DIFFERENTIAL/PLATELET
Abs Immature Granulocytes: 0.02 10*3/uL (ref 0.00–0.07)
Basophils Absolute: 0 10*3/uL (ref 0.0–0.1)
Basophils Relative: 1 %
Eosinophils Absolute: 0.2 10*3/uL (ref 0.0–0.5)
Eosinophils Relative: 3 %
HCT: 31.5 % — ABNORMAL LOW (ref 39.0–52.0)
Hemoglobin: 10.6 g/dL — ABNORMAL LOW (ref 13.0–17.0)
Immature Granulocytes: 0 %
Lymphocytes Relative: 21 %
Lymphs Abs: 1.4 10*3/uL (ref 0.7–4.0)
MCH: 31.5 pg (ref 26.0–34.0)
MCHC: 33.7 g/dL (ref 30.0–36.0)
MCV: 93.5 fL (ref 80.0–100.0)
Monocytes Absolute: 0.5 10*3/uL (ref 0.1–1.0)
Monocytes Relative: 8 %
Neutro Abs: 4.2 10*3/uL (ref 1.7–7.7)
Neutrophils Relative %: 67 %
Platelets: 153 10*3/uL (ref 150–400)
RBC: 3.37 MIL/uL — ABNORMAL LOW (ref 4.22–5.81)
RDW: 13.5 % (ref 11.5–15.5)
WBC: 6.3 10*3/uL (ref 4.0–10.5)
nRBC: 0 % (ref 0.0–0.2)

## 2023-03-26 LAB — COMPREHENSIVE METABOLIC PANEL
ALT: 13 U/L (ref 0–44)
AST: 15 U/L (ref 15–41)
Albumin: 3.3 g/dL — ABNORMAL LOW (ref 3.5–5.0)
Alkaline Phosphatase: 57 U/L (ref 38–126)
Anion gap: 10 (ref 5–15)
BUN: 27 mg/dL — ABNORMAL HIGH (ref 8–23)
CO2: 24 mmol/L (ref 22–32)
Calcium: 7.7 mg/dL — ABNORMAL LOW (ref 8.9–10.3)
Chloride: 101 mmol/L (ref 98–111)
Creatinine, Ser: 3.67 mg/dL — ABNORMAL HIGH (ref 0.61–1.24)
GFR, Estimated: 18 mL/min — ABNORMAL LOW (ref >60)
Glucose, Bld: 220 mg/dL — ABNORMAL HIGH (ref 70–99)
Potassium: 3.3 mmol/L — ABNORMAL LOW (ref 3.5–5.1)
Sodium: 135 mmol/L (ref 135–145)
Total Bilirubin: 0.5 mg/dL (ref 0.0–1.2)
Total Protein: 6.6 g/dL (ref 6.5–8.1)

## 2023-03-26 LAB — TROPONIN I (HIGH SENSITIVITY): Troponin I (High Sensitivity): 23 ng/L — ABNORMAL HIGH (ref <18)

## 2023-03-26 NOTE — ED Notes (Signed)
 Pt reports he wants to leave, does not want to wait any longer. Risks of leaving AMA explained to the patient and he verbalized understanding. Pt A&OX4. Pt encouraged to stay but he still refused. Pt encouraged to return to the ED for any new or worsening symptoms.

## 2023-03-26 NOTE — ED Triage Notes (Signed)
 Pt BIB EMS from home for blurred vision x 1.5 weeks. States his blurred vision has progressively gotten worse. Dialysis today, states he has not taken any of his medications today. CBG 430 with EMS, SBP 180.

## 2023-03-26 NOTE — ED Provider Notes (Signed)
 [name]   CSN: [id number] Arrival date & time: 03/26/23  1806     History Chief Complaint  Patient presents with   Blurred Vision    HPI [name] is a 64 y.o. male presenting for a myriad of complaints. States that he has been havin blurry vision out of his left eye for over 3 weeks. States is painless.  Denies fevers chills nausea vomiting shortness of breath.  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  He denies fevers chills nausea vomiting syncope shortness of breath.  No eye pain. Otherwise compliant on all of his medications.   Patient's recorded medical, surgical, social, medication list and allergies were reviewed in the Snapshot window as part of the initial history.   Review of Systems   Review of Systems  Constitutional:  Negative for chills and fever.  HENT:  Negative for ear pain and sore throat.   Eyes:  Positive for visual disturbance. Negative for pain.  Respiratory:  Negative for cough and shortness of breath.   Cardiovascular:  Negative for chest pain and palpitations.  Gastrointestinal:  Negative for abdominal pain and vomiting.  Genitourinary:  Negative for dysuria and hematuria.  Musculoskeletal:  Negative for arthralgias and back pain.  Skin:  Negative for color change and rash.  Neurological:  Negative for seizures and syncope.  All other systems reviewed and are negative.   Physical Exam Updated Vital Signs BP (!) 173/85   Pulse 86   Temp 98.2 F (36.8 C) (Oral)   Resp 16   Ht 4\' 7"  (1.397 m)   Wt 62.6 kg   SpO2 100%   BMI 32.07 kg/m  Physical Exam Vitals and nursing [name] reviewed.  Constitutional:      General: He is not in acute distress.    Appearance: He is well-developed.  HENT:     Head: Normocephalic and atraumatic.  Eyes:     Conjunctiva/sclera: Conjunctivae normal.  Cardiovascular:     Rate and Rhythm: Normal rate and  regular rhythm.     Heart sounds: No murmur heard. Pulmonary:     Effort: Pulmonary effort is normal. No respiratory distress.     Breath sounds: Normal breath sounds.  Abdominal:     Palpations: Abdomen is soft.     Tenderness: There is no abdominal tenderness.  Musculoskeletal:        General: No swelling.     Cervical back: Neck supple.  Skin:    General: Skin is warm and dry.     Capillary Refill: Capillary refill takes less than 2 seconds.  Neurological:     Mental Status: He is alert.  Psychiatric:        Mood and Affect: Mood normal.      ED Course/ Medical Decision Making/ A&P    Procedures Procedures   Medications Ordered in ED Medications - No data to display Medical Decision Making:    [name]  is a 63 y.o.  who presented to the ED today with visual disturbance detailed above.    This is most consistent with an acute potentially threatening illness complicated by underlying chronic conditions.  Patient placed on continuous vitals and telemetry monitoring while in ED which was reviewed periodically.  Complete initial physical exam performed, notably the patient  was hemodynamically stable in no acute distress.    Notably, patient's eye exam is as above   Visual Acuity  L: 20:200  R:20:40  External  L: NAA  R:NAA   Patient placed onto telemetry monitor while in ED which was reviewed.   Reviewed and confirmed nursing documentation for past medical history, family history, social history.    Assessment:   Patient's history of present illness and physical exam findings findings do not reveal any acute eye pathology.  Considered orbital cellulitis however patient has no pain with extraocular motions or gross orbital swelling, acute glaucoma however patient's eyes are with normal pressures bilaterally, HSV orbital infection however patient's eyes are without reticular staining pattern, corneal abrasion/ulcer however patient's eyes are without fluorescein  uptake or visual corneal abnormality on slit-lamp exam, open globe injury however patient's history of present illness and physical exam findings are grossly inconsistent   Plan:  Ultimately as patient's diagnosis is most consistent with either developing retinopathy whether hypertensive or diabetic.  Considered CVA however symptoms been present for 2 weeks.  Would anticipate CT head would have reasonable diagnostic sensitivity for stroke large enough to cause such severe substantial orbital symptoms.  Also considered developing cataract as patient does have some pupillary changes consistent with cataracts.  Reassessment: I was told the patient eloped from the emergency department prior to completion of CT head or my reassessment. Allegedly they had had complaint symptomatic resolution and did not want to stay.  This may have represented a TIA or other more focal pathology but ultimately patient has capacity to make her own medical decision making is regarding their long-term care.  They are welcome to return at any time for further care and management.  Clinical Impression:  1. Blurred vision      AMA   Final Clinical Impression(s) / ED Diagnoses Final diagnoses:  Blurred vision    Rx / DC Orders ED Discharge Orders     None         [name] Ade, MD 03/26/23 2311

## 2023-03-30 ENCOUNTER — Ambulatory Visit: Payer: Self-pay | Admitting: Student

## 2023-03-30 ENCOUNTER — Other Ambulatory Visit (HOSPITAL_COMMUNITY): Payer: Self-pay

## 2023-03-30 NOTE — Telephone Encounter (Signed)
 Intermittent amputated leg pain x2 weeks  Symptoms: Throbbing pain (7/10), hand pain x3 weeks Pertinent Negatives: Patient denies signs of infection, fever  Disposition: [x] Urgent Care (no appt availability in office)   Additional Notes: Pt states he had both of his legs amputated at the end of last year. Pt is not currently taking anything for his pain. Pt advised to be seen today and offered an appt for today but pt has to call a friend to take him. Pt states he will call back after talking with friend. This RN recommends pt be seen today and to go to urgent care if he is not able to make an appt today. Pt states understanding and is agreeable to plan.   Copied from CRM [id number]. Topic: Clinical - Red Word Triage >> Mar 30, 2023  1:25 PM Alcus Dad H wrote: Red Word that prompted transfer to Nurse Triage: Patient experiencing throbbing pain and can't take it anymore, started about 4 days ago. Patient just had both legs amputated Reason for Disposition  [1] SEVERE pain (e.g., excruciating, unable to do any normal activities) AND [2] not improved after 2 hours of pain medicine  Answer Assessment - Initial Assessment Questions Intermittent amputated leg pain x2 weeks  Symptoms: Throbbing pain (7/10), hand pain x3 weeks Pertinent Negatives: Patient denies signs of infection, fever  Protocols used: Leg Pain-A-AH

## 2023-04-12 ENCOUNTER — Telehealth: Payer: Self-pay | Admitting: *Deleted

## 2023-04-12 NOTE — Telephone Encounter (Signed)
 RTC to [facility] requesting Verbal orders for Home PT.  ! Time for 1 week.  Strengthening, Transfers and Balance.  Will reassess at visit for needf or additional time. Approval given.  Will forward to PCP for  Denial or approval.

## 2023-04-12 NOTE — Telephone Encounter (Signed)
 Copied from CRM [id number]. Topic: Clinical - Home Health Verbal Orders >> Apr 12, 2023  2:52 PM Corin V wrote: Caller/Agency: [facility] Callback Number: [phone] Service Requested: Physical Therapy Frequency: 1 week 1 Any new concerns about the patient? No

## 2023-04-18 ENCOUNTER — Telehealth: Payer: Self-pay | Admitting: *Deleted

## 2023-04-18 NOTE — Telephone Encounter (Signed)
 Copied from CRM [id number]. Topic: Clinical - Home Health Verbal Orders >> Apr 15, 2023  1:51 PM Danelle Dunning F wrote: Caller/Agency:   [facility]   Callback Number: [phone]  Service Requested: Physical Therapy  Frequency:   Two week 1   One week 1   Any new concerns about the patient? No

## 2023-04-18 NOTE — Telephone Encounter (Signed)
 Verbal authorization given.[name] Chretien Cassady4/14/20251:35 PM

## 2023-04-27 ENCOUNTER — Ambulatory Visit: Payer: 59

## 2023-04-27 VITALS — Ht <= 58 in | Wt 138.0 lb

## 2023-04-27 DIAGNOSIS — Z0000 Encounter for general adult medical examination without abnormal findings: Secondary | ICD-10-CM | POA: Diagnosis not present

## 2023-04-27 NOTE — Patient Instructions (Addendum)
 [name] , Thank you for taking time to come for your Medicare Wellness Visit. I appreciate your ongoing commitment to your health goals. Please review the following plan we discussed and let me know if I can assist you in the future.   Referrals/Orders/Follow-Ups/Clinician Recommendations: Yes, keep maintaining your health by keeping your appointments with Dr.  Carolee Churchman and any specialists that you may see.  Call us  if you need anything.  Have a great year!!!!  This is a list of the screening recommended for you and due dates:  Health Maintenance  Topic Date Due   Zoster (Shingles) Vaccine (1 of 2) Never done   Pneumococcal Vaccination (2 of 2 - PCV) 04/16/2011   Eye exam for diabetics  04/21/2016   Colon Cancer Screening  12/11/2019   DTaP/Tdap/Td vaccine (2 - Td or Tdap) 04/15/2020   Lipid (cholesterol) test  08/20/2022   COVID-19 Vaccine (2 - 2024-25 season) 09/05/2022   Hemoglobin A1C  03/22/2023   Flu Shot  08/05/2023   Medicare Annual Wellness Visit  04/26/2024   Hepatitis C Screening  Completed   HIV Screening  Completed   HPV Vaccine  Aged Out   Meningitis B Vaccine  Aged Out   Complete foot exam   Discontinued    Advanced directives: (Declined) Advance directive discussed with you today. Even though you declined this today, please call our office should you change your mind, and we can give you the proper paperwork for you to fill out.  Next Medicare Annual Wellness Visit scheduled for next year: Yes, It was nice speaking with you today! Your next Annual Wellness Visit is scheduled for 05/02/2024 at 2:20 p.m. via PHONE VISIT. If you need to reschedule or cancel, please call [phone]. (IMP)
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
## 2023-04-28 ENCOUNTER — Telehealth: Payer: Self-pay | Admitting: Student

## 2023-04-28 ENCOUNTER — Other Ambulatory Visit (HOSPITAL_COMMUNITY): Payer: Self-pay

## 2023-04-28 NOTE — Telephone Encounter (Signed)
 Called pt with no answer.  The patient will require a scheduled office visit per Ins requirements in order to receive a new WC.    Copied from CRM [id number]. Topic: General - Other >> Apr 28, 2023  2:51 PM Brynn Caras wrote: Reason for CRM: The patient has requested DME supply - Durable Wheelchair. The patient has had his previous wheelchair for the past 8-9 years. Patient would like confirmation on the estimated time his new wheelchair can be delivered? Callback [phone]

## 2023-04-29 ENCOUNTER — Other Ambulatory Visit (HOSPITAL_COMMUNITY): Payer: Self-pay

## 2023-05-02 ENCOUNTER — Telehealth: Payer: Self-pay | Admitting: Student

## 2023-05-02 NOTE — Telephone Encounter (Signed)
 Spoke with the patient.  Appt sch  Name: [name], [name] MRN: [id number]  Date: 05/03/2023 Status: Sch  Time: 3:15 PM Length: 30  Visit Type: OPEN ESTABLISHED [726] Copay: $0.00  Provider: Malen Scudder, DO       Copied from CRM [id number]. Topic: General - Other >> Apr 28, 2023  2:51 PM Brynn Caras wrote: Reason for CRM: The patient has requested DME supply - Durable Wheelchair. The patient has had his previous wheelchair for the past 8-9 years. Patient would like confirmation on the estimated time his new wheelchair can be delivered? Callback #:[phone] >> Apr 29, 2023  1:57 PM [name] wrote: Deloris Barner, calling on the patient's behalf to ask if there's any way to expedite delivery due to the urgency of the situation. Patient is an amputee and currently without a working chair. They were previously told an ETA of 3 days and are seeking an update or faster option if possible. Advised Deloris that the message has been routed to the appropriate team, and they are working on getting this resolved as quickly as possible. She was understanding, and asked for a callback at [phone]

## 2023-05-03 ENCOUNTER — Encounter: Admitting: Internal Medicine

## 2023-05-03 NOTE — Progress Notes (Deleted)
 Patient complains of dark stools for ***. He has not noticed frank red blood.*** He denies abdominal pain***. He has had this happen before.*** He does not take iron supplements.*** He is on ASA. His last colonoscopy was in 2020 which found several polyps and was recommended for a 1 year follow up colonoscopy; this was never done.

## 2023-05-05 ENCOUNTER — Encounter (HOSPITAL_COMMUNITY): Payer: Self-pay

## 2023-05-05 ENCOUNTER — Other Ambulatory Visit (HOSPITAL_COMMUNITY): Payer: Self-pay

## 2023-05-05 NOTE — Progress Notes (Signed)
 This visit was performed by a medical professional under my direct supervision. I was immediately available for consultation/collaboration. I have reviewed and agree with the Annual Wellness Visit documentation.

## 2023-05-06 NOTE — Progress Notes (Signed)
 Reviewed and signed

## 2023-05-20 NOTE — Telephone Encounter (Signed)
 Called patient LMOM for the pt  to sch an appt to discuss the Requested DME order.  The pt  no showed his previous appts in March and April.    Copied from CRM [id number]. Topic: General - Other >> Apr 28, 2023  2:51 PM Brynn Caras wrote: Reason for CRM: The patient has requested DME supply - Durable Wheelchair. The patient has had his previous wheelchair for the past 8-9 years. Patient would like confirmation on the estimated time his new wheelchair can be delivered? Callback [phone]

## 2023-05-25 ENCOUNTER — Other Ambulatory Visit: Payer: Self-pay | Admitting: Student

## 2023-05-25 ENCOUNTER — Other Ambulatory Visit (HOSPITAL_COMMUNITY): Payer: Self-pay

## 2023-05-25 DIAGNOSIS — E1152 Type 2 diabetes mellitus with diabetic peripheral angiopathy with gangrene: Secondary | ICD-10-CM

## 2023-05-25 DIAGNOSIS — G629 Polyneuropathy, unspecified: Secondary | ICD-10-CM

## 2023-05-31 ENCOUNTER — Other Ambulatory Visit (HOSPITAL_COMMUNITY): Payer: Self-pay

## 2023-05-31 MED ORDER — LANTUS SOLOSTAR 100 UNIT/ML ~~LOC~~ SOPN
15.0000 [IU] | PEN_INJECTOR | Freq: Every day | SUBCUTANEOUS | 1 refills | Status: AC
  Filled 2023-05-31 – 2023-07-12 (×2): qty 15, 100d supply, fill #0
  Filled 2023-07-21 – 2023-12-15 (×4): qty 15, 100d supply, fill #1

## 2023-05-31 MED ORDER — PREGABALIN 25 MG PO CAPS
25.0000 mg | ORAL_CAPSULE | Freq: Every day | ORAL | 0 refills | Status: DC
  Filled 2023-05-31: qty 30, 30d supply, fill #0

## 2023-05-31 MED ORDER — CARVEDILOL 6.25 MG PO TABS
6.2500 mg | ORAL_TABLET | Freq: Two times a day (BID) | ORAL | 0 refills | Status: DC
  Filled 2023-05-31: qty 60, 30d supply, fill #0

## 2023-06-02 ENCOUNTER — Emergency Department (HOSPITAL_COMMUNITY)
Admission: EM | Admit: 2023-06-02 | Discharge: 2023-06-02 | Discharge status: Against Medical Advice | Attending: Emergency Medicine | Admitting: Emergency Medicine

## 2023-06-02 ENCOUNTER — Encounter (HOSPITAL_COMMUNITY): Payer: Self-pay

## 2023-06-02 ENCOUNTER — Other Ambulatory Visit: Payer: Self-pay

## 2023-06-02 DIAGNOSIS — Z992 Dependence on renal dialysis: Secondary | ICD-10-CM | POA: Diagnosis not present

## 2023-06-02 DIAGNOSIS — F17210 Nicotine dependence, cigarettes, uncomplicated: Secondary | ICD-10-CM | POA: Diagnosis not present

## 2023-06-02 DIAGNOSIS — I132 Hypertensive heart and chronic kidney disease with heart failure and with stage 5 chronic kidney disease, or end stage renal disease: Secondary | ICD-10-CM | POA: Insufficient documentation

## 2023-06-02 DIAGNOSIS — N186 End stage renal disease: Secondary | ICD-10-CM | POA: Diagnosis present

## 2023-06-02 DIAGNOSIS — E1151 Type 2 diabetes mellitus with diabetic peripheral angiopathy without gangrene: Secondary | ICD-10-CM | POA: Diagnosis not present

## 2023-06-02 DIAGNOSIS — I5020 Unspecified systolic (congestive) heart failure: Secondary | ICD-10-CM | POA: Diagnosis not present

## 2023-06-02 LAB — CBC WITH DIFFERENTIAL/PLATELET
Abs Immature Granulocytes: 0.03 10*3/uL (ref 0.00–0.07)
Basophils Absolute: 0 10*3/uL (ref 0.0–0.1)
Basophils Relative: 0 %
Eosinophils Absolute: 0.2 10*3/uL (ref 0.0–0.5)
Eosinophils Relative: 2 %
HCT: 32.9 % — ABNORMAL LOW (ref 39.0–52.0)
Hemoglobin: 11.1 g/dL — ABNORMAL LOW (ref 13.0–17.0)
Immature Granulocytes: 0 %
Lymphocytes Relative: 20 %
Lymphs Abs: 1.7 10*3/uL (ref 0.7–4.0)
MCH: 30.8 pg (ref 26.0–34.0)
MCHC: 33.7 g/dL (ref 30.0–36.0)
MCV: 91.4 fL (ref 80.0–100.0)
Monocytes Absolute: 0.5 10*3/uL (ref 0.1–1.0)
Monocytes Relative: 6 %
Neutro Abs: 6.3 10*3/uL (ref 1.7–7.7)
Neutrophils Relative %: 72 %
Platelets: 158 10*3/uL (ref 150–400)
RBC: 3.6 MIL/uL — ABNORMAL LOW (ref 4.22–5.81)
RDW: 12.9 % (ref 11.5–15.5)
WBC: 8.7 10*3/uL (ref 4.0–10.5)
nRBC: 0 % (ref 0.0–0.2)

## 2023-06-02 LAB — BASIC METABOLIC PANEL WITH GFR
Anion gap: 8 (ref 5–15)
BUN: 28 mg/dL — ABNORMAL HIGH (ref 8–23)
CO2: 27 mmol/L (ref 22–32)
Calcium: 8.6 mg/dL — ABNORMAL LOW (ref 8.9–10.3)
Chloride: 99 mmol/L (ref 98–111)
Creatinine, Ser: 4.1 mg/dL — ABNORMAL HIGH (ref 0.61–1.24)
GFR, Estimated: 16 mL/min — ABNORMAL LOW (ref >60)
Glucose, Bld: 146 mg/dL — ABNORMAL HIGH (ref 70–99)
Potassium: 3.8 mmol/L (ref 3.5–5.1)
Sodium: 134 mmol/L — ABNORMAL LOW (ref 135–145)
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
## 2023-06-02 NOTE — ED Notes (Signed)
 Patient wants to leave AMA, Colbaugh, MD notified.

## 2023-06-02 NOTE — ED Notes (Signed)
Help get patient into a gown on the monitor patient is resting with call bell in reach

## 2023-06-02 NOTE — ED Triage Notes (Signed)
 Pt ws at the [facility] dialysis center today, they were unable to do dialysis due to his port-a-cath being clogged. He mentions he has missed x3 dialysis appointments due to this. He has mild complaints of pain in his stumps from his bilateral leg amputations. Pt is otherwise stable at this tie and cooperative.

## 2023-06-02 NOTE — ED Notes (Addendum)
 Pt requesting to leave AMA. MD Curatolo notified. PIV removed. Pt refuses to sign AMA form.

## 2023-06-08 ENCOUNTER — Telehealth: Payer: Self-pay | Admitting: *Deleted

## 2023-06-08 NOTE — Telephone Encounter (Signed)
 Spoke with [facility] [phone] / regarding PCS - assessment was scheduled for 01-03-2023 @10 :30 am. Will contact patient . Per patient says he hasn't heard from anyone who would have called to schedule an appointment- will give patient a call back to give him the number to [facility] to call an speak with the office.

## 2023-06-09 ENCOUNTER — Telehealth: Payer: Self-pay | Admitting: *Deleted

## 2023-06-09 ENCOUNTER — Other Ambulatory Visit (HOSPITAL_COMMUNITY): Payer: Self-pay

## 2023-06-09 ENCOUNTER — Other Ambulatory Visit: Payer: Self-pay

## 2023-06-09 NOTE — Telephone Encounter (Signed)
 Spoke with Martie Slaughter regarding him calling back Medicaid about his PCS/ he then had me to talk with his son-Sunny, phone number to [facility] [phone])was given to him to call them for his father. Lealand say medicaid did not contact him for assessment appointment- Per [facility]- patient had assessment  done / appointment 01-03-2024 @ 10:30 am. Patient is really needing this service.Aaron Aas

## 2023-06-14 ENCOUNTER — Encounter: Payer: Self-pay | Admitting: *Deleted

## 2023-06-14 ENCOUNTER — Other Ambulatory Visit (HOSPITAL_COMMUNITY): Payer: Self-pay

## 2023-06-15 ENCOUNTER — Telehealth: Payer: Self-pay | Admitting: *Deleted

## 2023-06-15 NOTE — Telephone Encounter (Signed)
 Called patient [name] for him to have son (sunny) to give call back-sunny never called back.  Needing to know status of PCS.

## 2023-06-20 ENCOUNTER — Telehealth: Payer: Self-pay

## 2023-06-20 ENCOUNTER — Other Ambulatory Visit (HOSPITAL_COMMUNITY): Payer: Self-pay

## 2023-06-20 LAB — HM DIABETES EYE EXAM

## 2023-06-20 MED ORDER — OFLOXACIN 0.3 % OP SOLN
1.0000 [drp] | Freq: Four times a day (QID) | OPHTHALMIC | 1 refills | Status: AC
  Filled 2023-06-20: qty 5, 25d supply, fill #0
  Filled 2023-07-12: qty 5, 25d supply, fill #1

## 2023-06-20 MED ORDER — PREDNISOLONE ACETATE 1 % OP SUSP
1.0000 [drp] | Freq: Every day | OPHTHALMIC | 5 refills | Status: AC
  Filled 2023-06-20: qty 5, 30d supply, fill #0
  Filled 2023-07-21: qty 5, 30d supply, fill #1
  Filled 2023-08-19: qty 5, 30d supply, fill #2
  Filled 2023-09-15: qty 5, 30d supply, fill #3
  Filled 2023-12-15: qty 5, 30d supply, fill #4
  Filled 2024-02-03: qty 5, 30d supply, fill #5

## 2023-06-20 NOTE — Telephone Encounter (Unsigned)
 Copied from CRM [id number]. Topic: Clinical - Medical Advice >> Jun 20, 2023  4:44 PM Tiffany H wrote: Reason for CRM: Patient is supposed to have a cataract surgery on 07/01/23. Patient needs a clearance for this surgery for cataract procedure on 07/01/23. Vitals at appointment were: Glucose 424, BP 152/82 - patient advised that he hadn't eaten or taken AM insulin . Deonna advised that patient was an unreliable historian and had concerns patient was no longer taking all of his diabetes medication while trying to seek clearance for a major surgery. Patient's last appointment with PCP was in December of 2024. Please do a wellness check with both patient, his sister and his vascular surgeon, who recently approved reflls.   Once wellness check has been completed and patient has been scheduled for an appointment for surgical clearance, please call Deonna back to update her on patient's fitness for surgery as soon as 07/01/23. If Patient won't be fit by 07/01/23, please advise so that Deonna can have the surgery rescheduled.   Deonna surmised that insulin  usage isn't consistent. Patient was drowsy but alert.  No access to mychart.   Secure APP Line:[phone] Ext [phone]   Please leave a detailed message.

## 2023-06-22 ENCOUNTER — Other Ambulatory Visit: Payer: Self-pay

## 2023-06-22 ENCOUNTER — Ambulatory Visit: Payer: Self-pay | Admitting: Student

## 2023-06-22 ENCOUNTER — Other Ambulatory Visit (HOSPITAL_COMMUNITY): Payer: Self-pay

## 2023-06-22 VITALS — BP 174/72 | HR 86 | Temp 98.7°F | Ht <= 58 in | Wt 157.0 lb

## 2023-06-22 DIAGNOSIS — G629 Polyneuropathy, unspecified: Secondary | ICD-10-CM | POA: Diagnosis not present

## 2023-06-22 DIAGNOSIS — R5381 Other malaise: Secondary | ICD-10-CM

## 2023-06-22 DIAGNOSIS — Z794 Long term (current) use of insulin: Secondary | ICD-10-CM | POA: Diagnosis not present

## 2023-06-22 DIAGNOSIS — I10 Essential (primary) hypertension: Secondary | ICD-10-CM

## 2023-06-22 DIAGNOSIS — E1151 Type 2 diabetes mellitus with diabetic peripheral angiopathy without gangrene: Secondary | ICD-10-CM | POA: Diagnosis not present

## 2023-06-22 DIAGNOSIS — Z01818 Encounter for other preprocedural examination: Secondary | ICD-10-CM

## 2023-06-22 LAB — POCT GLYCOSYLATED HEMOGLOBIN (HGB A1C): Hemoglobin A1C: 9.5 % — AB (ref 4.0–5.6)

## 2023-06-22 LAB — GLUCOSE, CAPILLARY: Glucose-Capillary: 405 mg/dL — ABNORMAL HIGH (ref 70–99)

## 2023-06-22 MED ORDER — CARVEDILOL 6.25 MG PO TABS
6.2500 mg | ORAL_TABLET | Freq: Two times a day (BID) | ORAL | 3 refills | Status: DC
  Filled 2023-06-22 (×2): qty 60, 30d supply, fill #0
  Filled 2023-07-21: qty 60, 30d supply, fill #1
  Filled 2023-08-19: qty 60, 30d supply, fill #2
  Filled 2023-09-15: qty 60, 30d supply, fill #3

## 2023-06-22 MED ORDER — PREGABALIN 25 MG PO CAPS
25.0000 mg | ORAL_CAPSULE | Freq: Every day | ORAL | 0 refills | Status: DC
  Filled 2023-06-22 (×2): qty 30, 30d supply, fill #0

## 2023-06-22 NOTE — Patient Instructions (Addendum)
 Thank you, [name], for allowing us  to provide your care today. Today we discussed . . .  > Diabetes       - I am not can change your medications for diabetes today but I would like to monitor your blood sugars at home with a continuous glucose monitor.  We will call you with information on this and you may need to come in to  the clinic to get it set up early next week.  I am also going to send a referral to endocrinology to help manage your blood sugars. > High blood pressure       - I am going to restart your carvedilol  today.  It is important that you do take this medicine at home.  Please tell your kidney doctor what medications you have and have not been taking.  You can do this by bring your medications to our clinic for review or writing down all the medications that you take every day and bring in that to our clinic or to your kidney doctor.   > Surgery       - As we discussed I do not recommend you undergo elective surgery until we have better control of your blood pressure and blood sugars.  I will fill out the form that [facility] sent to our clinic and tell them my recommendation.  Please discuss this further with your eye doctor.  I will also fill out PCS paperwork for you and send an order for home health nurse to come help you with your medications.   I have ordered the following labs for you:  Lab Orders         Glucose, capillary         POC Hbg A1C       Referrals ordered today:   Referral Orders         Ambulatory referral to Endocrinology         Ambulatory referral to Home Health        Follow up: 1 month    Remember:  Should you have any questions or concerns please call the internal medicine clinic at [phone].     [name] Dallas, DO [facility]

## 2023-06-22 NOTE — Telephone Encounter (Signed)
 Patient has an appointment scheduled for this afternoon 06/22/23 at 3:15 pm with Dr. Hayes Lipps.

## 2023-06-22 NOTE — Progress Notes (Unsigned)
 CC: Acute Concern of preoperative clearance, last office visit 12/22/2022  HPI:  [name] is a 63 y.o. male with pertinent PMH of HTN, HFrEF, T2DM, ESRD on HD, CAD with prior STEMI, anemia of chronic disease, and mild cognitive impairment who presents as above. Please see assessment and plan below for further details.  Medications: Current Outpatient Medications  Medication Instructions   Accu-Chek Softclix Lancets lancets Use to check blood sugar 3 (three) times daily.   aspirin  EC 81 mg, Oral, Daily, Swallow whole.   Blood Glucose Monitoring Suppl (ACCU-CHEK GUIDE) w/Device KIT Use to check your blood sugar three times daily.   carvedilol  (COREG ) 6.25 MG tablet Take 1 tablet (6.25 mg total) by mouth 2 (two) times daily with meals for hypertension   diclofenac  Sodium (VOLTAREN ) 4 g, Topical, 4 times daily   DULoxetine  (CYMBALTA ) 60 mg, Oral, Daily   ezetimibe  (ZETIA ) 10 mg, Oral, Daily   glucose blood (ACCU-CHEK AVIVA PLUS) test strip Check blood sugar 3 times a day   insulin  lispro (HUMALOG ) 5 Units, Subcutaneous, 3 times daily with meals   Insulin  Pen Needle 32G X 4 MM MISC Use to inject insulin  4 (four) times daily.   Lantus  SoloStar 15 Units, Subcutaneous, Daily at bedtime, Please ship to patient. Thank you   melatonin (KP MELATONIN) 3 MG TABS tablet Take 1 tablet (3 mg total) by mouth at bedtime for insomnia.   ofloxacin (OCUFLOX) 0.3 % ophthalmic solution Place 1 drop into the left eye 4 (four) times daily for 1 week, then stop.   prednisoLONE acetate (PRED FORTE) 1 % ophthalmic suspension Instill 1 drop into left eye 4 times a day for 1 week, then 2  times a day for 1 week, then once daily for 2 weeks then STOP   pregabalin  (LYRICA ) 25 MG capsule Take 1 capsule by mouth once a day   rosuvastatin  (CRESTOR ) 10 mg, Oral, Daily     Review of Systems:   Pertinent items noted in HPI and/or A&P.  Physical Exam:  Vitals:   06/22/23 1543  BP: (!) 174/72  Pulse: 86  Temp:  98.7 F (37.1 C)  TempSrc: Oral  SpO2: 100%  Weight: 157 lb (71.2 kg)  Height: 4' 2 (1.27 m)    Constitutional: Disheveled male who appears older than stated age sitting in a wheelchair. In no acute distress. HEENT: Normocephalic, atraumatic Cardio:Regular rate and rhythm.  Faint bilateral radial pulses.  Mildly slow capillary refill in bilateral upper extremities Pulm:Clear to auscultation bilaterally. Normal work of breathing on room air. MSK: Right BKA with prosthetic in place, stable left AKA Skin:Warm and dry. Neuro:Alert and oriented x3.    Assessment & Plan:   Hypertension, essential Blood pressure today is significantly elevated at 174/72.  He does not know what medications he is taking at home but based on pharmacy fill history he has not filled his carvedilol  in the last 6 months.  We will restart his carvedilol  and recommend that he keep a blood pressure log at home as well as have home health nurse come out to evaluate his medications.  Also recommended that he discuss his medications with his nephrologist. - Refill for carvedilol  6.25 mg twice daily  Type 2 diabetes mellitus with diabetic peripheral angiopathy without gangrene, with long-term current use of insulin  (HCC) Primary reason for the visit today was to be evaluated preoperatively for cataract surgery scheduled for 07/01/2023 due to his blood sugar being in the 400s during his visit with the  eye surgeon.  A1c today is 9.5 which is stable from 9.3 in December 2020 4.  He states that he is taking 17 units of insulin  glargine nightly but does not have any short acting insulin .  Pharmacy fill history does support this, last fill of insulin  glargine March 31, 2023 for 100-day supply but there is also a fill for insulin  lispro on May 29, 2023 so he could be taking 17 units of short acting nightly.  Overall options are limited in this patient with ESRD and progressive difficulty with hand dexterity due to significant peripheral  artery disease and neuropathy without evidence for revascularization.  We will plan to have him follow-up with endocrinology, set him up with a professional CGM through our office, and due to no recent DKA we will hold off on any changes in his insulin  regimen until he has a medication review with home health nurse or he brings his medicines into our clinic. - Continue current insulin  regimen (either 17 units of glargine or lispro nightly) - Referral to endocrinology - Set up with professional CGM - Follow-up in 3-4 weeks for reevaluation  Preop examination Presents today for preoperative clearance to have complex cataract surgery on 07/01/2023.  RCRI score of 4-5 with a 15% risk of major cardiac event during surgery and NSQIP estimates risk to be about 21% to have serious complications when compared to 12% in the average patient based on a surgery with a similar level of sedation and invasiveness.  Discussed my recommendations to hold off on the surgery until we have better control of his blood pressure and blood sugar but that ultimately it is a decision between him and his surgeon as long as they both understand the risks.  He states his understanding.  Disability affecting daily living This patient is bilateral lower extremity amputee with upper extremity neuropathy and weakness due to PAD and diabetic neuropathy.  We are currently working on managing his diabetes as best as possible and he was seen by vascular surgery in March 2025 without any evidence for revascularization in his upper extremities.  These deficits together along with mild to moderate cognitive impairment have made it extremely difficult for him to manage his own medications and complex health care at home but he is very resistant to going into a facility for more help.  Today we will fill out paperwork for personal care services and order home health nurse to evaluate him in his home and primarily go through his medications as he and  his healthcare team are unsure what medications he is taking.    Patient discussed with Dr. Cydne Doyne [name]  [name] Dallas, DO [facility] Internal Medicine Resident PGY-2 Clinic Phone: [phone] Please contact the on call pager at [phone] for any urgent or emergent needs.

## 2023-06-23 ENCOUNTER — Other Ambulatory Visit (HOSPITAL_COMMUNITY): Payer: Self-pay

## 2023-06-23 ENCOUNTER — Other Ambulatory Visit: Payer: Self-pay

## 2023-06-23 DIAGNOSIS — Z01818 Encounter for other preprocedural examination: Secondary | ICD-10-CM | POA: Insufficient documentation

## 2023-06-23 NOTE — Assessment & Plan Note (Signed)
 Blood pressure today is significantly elevated at 174/72.  He does not know what medications he is taking at home but based on pharmacy fill history he has not filled his carvedilol  in the last 6 months.  We will restart his carvedilol  and recommend that he keep a blood pressure log at home as well as have home health nurse come out to evaluate his medications.  Also recommended that he discuss his medications with his nephrologist. - Refill for carvedilol  6.25 mg twice daily

## 2023-06-23 NOTE — Assessment & Plan Note (Signed)
 This patient is bilateral lower extremity amputee with upper extremity neuropathy and weakness due to PAD and diabetic neuropathy.  We are currently working on managing his diabetes as best as possible and he was seen by vascular surgery in March 2025 without any evidence for revascularization in his upper extremities.  These deficits together along with mild to moderate cognitive impairment have made it extremely difficult for him to manage his own medications and complex health care at home but he is very resistant to going into a facility for more help.  Today we will fill out paperwork for personal care services and order home health nurse to evaluate him in his home and primarily go through his medications as he and his healthcare team are unsure what medications he is taking.

## 2023-06-23 NOTE — Assessment & Plan Note (Signed)
 Primary reason for the visit today was to be evaluated preoperatively for cataract surgery scheduled for 07/01/2023 due to his blood sugar being in the 400s during his visit with the eye surgeon.  A1c today is 9.5 which is stable from 9.3 in December 2020 4.  He states that he is taking 17 units of insulin  glargine nightly but does not have any short acting insulin .  Pharmacy fill history does support this, last fill of insulin  glargine March 31, 2023 for 100-day supply but there is also a fill for insulin  lispro on May 29, 2023 so he could be taking 17 units of short acting nightly.  Overall options are limited in this patient with ESRD and progressive difficulty with hand dexterity due to significant peripheral artery disease and neuropathy without evidence for revascularization.  We will plan to have him follow-up with endocrinology, set him up with a professional CGM through our office, and due to no recent DKA we will hold off on any changes in his insulin  regimen until he has a medication review with home health nurse or he brings his medicines into our clinic. - Continue current insulin  regimen (either 17 units of glargine or lispro nightly) - Referral to endocrinology - Set up with professional CGM - Follow-up in 3-4 weeks for reevaluation

## 2023-06-23 NOTE — Assessment & Plan Note (Signed)
 Presents today for preoperative clearance to have complex cataract surgery on 07/01/2023.  RCRI score of 4-5 with a 15% risk of major cardiac event during surgery and NSQIP estimates risk to be about 21% to have serious complications when compared to 12% in the average patient based on a surgery with a similar level of sedation and invasiveness.  Discussed my recommendations to hold off on the surgery until we have better control of his blood pressure and blood sugar but that ultimately it is a decision between him and his surgeon as long as they both understand the risks.  He states his understanding.

## 2023-06-24 NOTE — Progress Notes (Signed)
 Internal Medicine Clinic Attending  Case discussed with the resident at the time of the visit.  We reviewed the resident's history and exam and pertinent patient test results.  I agree with the assessment, diagnosis, and plan of care documented in the resident's note.

## 2023-06-25 ENCOUNTER — Other Ambulatory Visit (HOSPITAL_COMMUNITY): Payer: Self-pay

## 2023-06-27 ENCOUNTER — Telehealth: Payer: Self-pay | Admitting: *Deleted

## 2023-06-27 NOTE — Telephone Encounter (Signed)
 Copied from CRM [id number]. Topic: General - Other >> Jun 27, 2023  2:36 PM Cherylann RAMAN wrote: Reason for CRM: Chrissie Moselle, calling to check on the status of the home health. She has not received a call to schedule the appt for the evaluation. Please contact her back at [phone]. Name the Home Health is [facility] [phone]

## 2023-06-29 ENCOUNTER — Telehealth: Payer: Self-pay | Admitting: *Deleted

## 2023-06-29 NOTE — Telephone Encounter (Signed)
 Copied from CRM [id number]. Topic: General - Other >> Jun 29, 2023  8:21 AM Cherylann RAMAN wrote: Reason for CRM: Chrissie Moselle requesting a callback from [facility] regarding patient's home health evaluation. Please contact Delores at [phone]

## 2023-07-05 ENCOUNTER — Telehealth: Payer: Self-pay | Admitting: Dietician

## 2023-07-05 ENCOUNTER — Telehealth: Payer: Self-pay | Admitting: *Deleted

## 2023-07-05 DIAGNOSIS — E1151 Type 2 diabetes mellitus with diabetic peripheral angiopathy without gangrene: Secondary | ICD-10-CM

## 2023-07-05 NOTE — Telephone Encounter (Signed)
 Left voicemail for return call to schedule an appointment for Continuous glucose monitor with me per Dr. Jolaine.

## 2023-07-05 NOTE — Telephone Encounter (Signed)
 Copied from CRM [id number]. Topic: General - Other >> Jun 29, 2023  8:21 AM Cherylann RAMAN wrote: Reason for CRM: Chrissie Moselle requesting a callback from [facility] regarding patient's home health evaluation. Please contact Delores at [phone] >> Jul 05, 2023  1:31 PM Zane F wrote: Ms. Gwenn, is calling back to speak with Lela in the clinic for her assistance with Home health for the patient; Agent did contact [employer] for additional assistance and was able to get the caller in touch with Lela

## 2023-07-06 ENCOUNTER — Telehealth: Payer: Self-pay | Admitting: *Deleted

## 2023-07-06 NOTE — Telephone Encounter (Signed)
 Spoke with Mr. Sledge' sister Ronal who will bring him for his appointment on Monday 07-11-23.

## 2023-07-06 NOTE — Telephone Encounter (Signed)
 Called patient lvm regarding status of PCS / per medicaid form had to be refaxed for review. There is no need for patient nor his contact to call about the PCS / they will call them with the assessment appointment/ medicaid do not call me regarding the status.  [phone] Lift.

## 2023-07-06 NOTE — Telephone Encounter (Signed)
 Call to [facility] informed her that she will receive a call to set up a time for the Agency to come out to assess patient for services.  The Clinics do not receive a call when the evaluation has been set up.

## 2023-07-11 ENCOUNTER — Ambulatory Visit: Payer: Self-pay | Admitting: Dietician

## 2023-07-11 ENCOUNTER — Other Ambulatory Visit: Payer: Self-pay | Admitting: Dietician

## 2023-07-11 DIAGNOSIS — E119 Type 2 diabetes mellitus without complications: Secondary | ICD-10-CM

## 2023-07-11 DIAGNOSIS — E1151 Type 2 diabetes mellitus with diabetic peripheral angiopathy without gangrene: Secondary | ICD-10-CM

## 2023-07-11 DIAGNOSIS — Z794 Long term (current) use of insulin: Secondary | ICD-10-CM

## 2023-07-11 NOTE — Telephone Encounter (Signed)
 Requests Continuous glucose monitoring sensors

## 2023-07-11 NOTE — Progress Notes (Signed)
 Sample Dexcom receiver and sensor provided to patient today. He desires to continue using and requests additional sensors. He will need follow up for continued learning as he wanted me to demonstrate today how to put sensor on.  Sister brings him but not willing to assist in his care.  Lab Results  Component Value Date   HGBA1C 9.5 (A) 06/22/2023   HGBA1C 9.3 (A) 12/22/2022   HGBA1C 8.7 (A) 03/03/2022   HGBA1C 8.2 (A) 11/17/2021   HGBA1C 8.7 (H) 08/11/2021    Diabetes Self-Management Education  Visit Type: First/Initial  Appt. Start Time: 1415 Appt. End Time: 1450  07/11/2023  [name], identified by name and date of birth, is a 63 y.o. male with a diagnosis of Diabetes: Type 2.   ASSESSMENT  There were no vitals taken for this visit. There is no height or weight on file to calculate BMI.   Diabetes Self-Management Education - 07/11/23 1600       Visit Information   Visit Type First/Initial      Initial Visit   Diabetes Type Type 2    Are you currently following a meal plan? No    Are you taking your medications as prescribed? Yes      Psychosocial Assessment   Patient Belief/Attitude about Diabetes Motivated to manage diabetes    What is the hardest part about your diabetes right now, causing you the most concern, or is the most worrisome to you about your diabetes?   Checking blood sugar   has trouble using hands, does not check blood sugar   Self-care barriers Debilitated state due to current medical condition;Lack of transportation;Unsteady gait/risk for falls;Lack of material resources    Self-management support Family;Doctor's office;CDE visits    Patient Concerns Monitoring    Special Needs None    Preferred Learning Style Hands on    Learning Readiness Ready    How often do you need to have someone help you when you read instructions, pamphlets, or other written materials from your doctor or pharmacy? 3 - Sometimes    What is the last grade level you  completed in school? 12      Pre-Education Assessment   Patient understands the diabetes disease and treatment process. Comprehends key points    Patient understands using medications safely. Comprehends key points    Patient understands monitoring blood glucose, interpreting and using results Needs Review    Patient understands prevention, detection, and treatment of acute complications. Comprehends key points    Patient understands prevention, detection, and treatment of chronic complications. Compreheands key points    Patient understands how to develop strategies to address psychosocial issues. Comprehends key points    Patient understands how to develop strategies to promote health/change behavior. Comprehends key points      Complications   Last HgB A1C per patient/outside source 9.5 %    How often do you check your blood sugar? 0 times/day (not testing)    Fasting Blood glucose range (mg/dL) --   unknown   Postprandial Blood glucose range (mg/dL) --   unknown   Number of hypoglycemic episodes per month 0    Number of hyperglycemic episodes ( >200mg /dL): --   unknown   Have you had a dilated eye exam in the past 12 months? No    Have you had a dental exam in the past 12 months? No    Are you checking your feet? N/A   bilateral amputee     Dietary  Intake   Breakfast plan to assess at follow up      Activity / Exercise   Activity / Exercise Type ADL's   wheel chair bound     Patient Education   Previous Diabetes Education Yes (please comment)   here and while in hospital   Monitoring Taught/evaluated CGM (comment)      Individualized Goals (developed by patient)   Monitoring  Consistenly use CGM      Post-Education Assessment   Patient understands monitoring blood glucose, interpreting and using results Needs Review      Outcomes   Expected Outcomes Demonstrated interest in learning but significant barriers to change    Future DMSE 2 wks    Program Status Not Completed           Individualized Plan for Diabetes Self-Management Training:   Learning Objective:  Patient will have a greater understanding of diabetes self-management. Patient education plan is to attend individual and/or group sessions per assessed needs and concerns.   Plan:   Patient Instructions  Today we placed a dexcom G7 sensors and gave you a receiver to continuous monitor your blood sugar.  Please keep the receiver with you or in 20 fett of you at all times.    Charge the receiver as needed.  Please make a follow up visit in 10-14 days to download your receiver and talk about next steps.   If you have questions or concerns about the dexcom G7 call me or call Dexcom [phone]  [name] [phone] (new number)   Expected Outcomes:  Demonstrated interest in learning but significant barriers to change  Education material provided: Diabetes Resources  If problems or questions, patient to contact team via:  Phone  Future DSME appointment: 2 wks [name] Hole, RD 07/11/2023 4:28 PM.

## 2023-07-11 NOTE — Patient Instructions (Signed)
 Today we placed a dexcom G7 sensors and gave you a receiver to continuous monitor your blood sugar.  Please keep the receiver with you or in 20 fett of you at all times.    Charge the receiver as needed.  Please make a follow up visit in 10-14 days to download your receiver and talk about next steps.   If you have questions or concerns about the dexcom G7 call me or call Dexcom [phone]  [place] [phone] (new number)

## 2023-07-12 ENCOUNTER — Other Ambulatory Visit: Payer: Self-pay

## 2023-07-12 ENCOUNTER — Other Ambulatory Visit (HOSPITAL_COMMUNITY): Payer: Self-pay

## 2023-07-12 MED ORDER — DEXCOM G7 SENSOR MISC
3 refills | Status: DC
  Filled 2023-07-12: qty 9, 90d supply, fill #0
  Filled 2023-07-21 – 2023-09-15 (×3): qty 9, 90d supply, fill #1

## 2023-07-12 NOTE — Telephone Encounter (Signed)
 [name], Please see the message below and call the patient back.   Copied from CRM [id number]. Topic: General - Other >> Jul 12, 2023 12:41 PM Carrielelia G wrote: Attn: [name]   Please call [facility] regarding Home Health Care assistance for [name] or Fax  prior authorization to [phone] and/or [phone]  Also his friend Ms. [name] would like a call back today.  Please advise

## 2023-07-13 ENCOUNTER — Other Ambulatory Visit (HOSPITAL_COMMUNITY): Payer: Self-pay

## 2023-07-13 ENCOUNTER — Other Ambulatory Visit: Payer: Self-pay

## 2023-07-19 ENCOUNTER — Telehealth: Payer: Self-pay | Admitting: Dietician

## 2023-07-19 NOTE — Telephone Encounter (Signed)
 Call to schedule Continuous glucose monitoring (CGM) follow up  visit to download receiver and assist patient with learning to place his own sensor to be able to continue to use CGM. Left message for patient to call back. Unable to leave a message on sister's voicemail. ( She is his transportation)

## 2023-07-21 ENCOUNTER — Other Ambulatory Visit: Payer: Self-pay

## 2023-07-21 ENCOUNTER — Other Ambulatory Visit: Payer: Self-pay | Admitting: Student

## 2023-07-21 ENCOUNTER — Other Ambulatory Visit (HOSPITAL_COMMUNITY): Payer: Self-pay

## 2023-07-21 DIAGNOSIS — G629 Polyneuropathy, unspecified: Secondary | ICD-10-CM

## 2023-07-22 ENCOUNTER — Other Ambulatory Visit (HOSPITAL_COMMUNITY): Payer: Self-pay

## 2023-07-22 ENCOUNTER — Other Ambulatory Visit: Payer: Self-pay

## 2023-07-22 MED ORDER — PREGABALIN 25 MG PO CAPS
25.0000 mg | ORAL_CAPSULE | Freq: Every day | ORAL | 0 refills | Status: DC
  Filled 2023-07-22: qty 30, 30d supply, fill #0

## 2023-07-25 ENCOUNTER — Encounter

## 2023-07-25 ENCOUNTER — Encounter: Admitting: Dietician

## 2023-07-27 DIAGNOSIS — N186 End stage renal disease: Secondary | ICD-10-CM

## 2023-07-29 ENCOUNTER — Ambulatory Visit (HOSPITAL_COMMUNITY)
Admission: RE | Admit: 2023-07-29 | Discharge: 2023-07-29 | Disposition: A | Discharge status: Home / Self Care | Attending: Vascular Surgery | Admitting: Vascular Surgery

## 2023-07-29 ENCOUNTER — Encounter (HOSPITAL_COMMUNITY)
Admission: RE | Disposition: A | Discharge status: Home / Self Care | Payer: Self-pay | Source: Home / Self Care | Attending: Vascular Surgery

## 2023-07-29 ENCOUNTER — Other Ambulatory Visit: Payer: Self-pay

## 2023-07-29 DIAGNOSIS — T8249XA Other complication of vascular dialysis catheter, initial encounter: Secondary | ICD-10-CM | POA: Diagnosis present

## 2023-07-29 DIAGNOSIS — N186 End stage renal disease: Secondary | ICD-10-CM | POA: Diagnosis not present

## 2023-07-29 DIAGNOSIS — Y839 Surgical procedure, unspecified as the cause of abnormal reaction of the patient, or of later complication, without mention of misadventure at the time of the procedure: Secondary | ICD-10-CM | POA: Insufficient documentation

## 2023-07-29 DIAGNOSIS — I5022 Chronic systolic (congestive) heart failure: Secondary | ICD-10-CM | POA: Insufficient documentation

## 2023-07-29 DIAGNOSIS — F17210 Nicotine dependence, cigarettes, uncomplicated: Secondary | ICD-10-CM | POA: Diagnosis not present

## 2023-07-29 DIAGNOSIS — E1122 Type 2 diabetes mellitus with diabetic chronic kidney disease: Secondary | ICD-10-CM | POA: Diagnosis not present

## 2023-07-29 DIAGNOSIS — Z794 Long term (current) use of insulin: Secondary | ICD-10-CM | POA: Diagnosis not present

## 2023-07-29 DIAGNOSIS — Z79899 Other long term (current) drug therapy: Secondary | ICD-10-CM | POA: Diagnosis not present

## 2023-07-29 DIAGNOSIS — I132 Hypertensive heart and chronic kidney disease with heart failure and with stage 5 chronic kidney disease, or end stage renal disease: Secondary | ICD-10-CM | POA: Diagnosis not present

## 2023-07-29 HISTORY — PX: TUNNELLED CATHETER EXCHANGE: CATH118373

## 2023-07-29 LAB — GLUCOSE, CAPILLARY: Glucose-Capillary: 262 mg/dL — ABNORMAL HIGH (ref 70–99)

## 2023-07-29 SURGERY — TUNNELLED CATHETER EXCHANGE
Anesthesia: LOCAL

## 2023-07-29 MED ORDER — HEPARIN SODIUM (PORCINE) 1000 UNIT/ML IJ SOLN
INTRAMUSCULAR | Status: DC | PRN
Start: 2023-07-29 — End: 2023-07-29
  Administered 2023-07-29 (×2): 1600 [IU] via INTRAVENOUS

## 2023-07-29 MED ORDER — HEPARIN SODIUM (PORCINE) 1000 UNIT/ML IJ SOLN
INTRAMUSCULAR | Status: AC
Start: 2023-07-29 — End: 2023-07-29
  Filled 2023-07-29: qty 10

## 2023-07-29 MED ORDER — LIDOCAINE HCL (PF) 1 % IJ SOLN
INTRAMUSCULAR | Status: DC | PRN
Start: 2023-07-29 — End: 2023-07-29
  Administered 2023-07-29: 10 mL
  Administered 2023-07-29: 5 mL

## 2023-07-29 MED ORDER — HEPARIN (PORCINE) IN NACL 1000-0.9 UT/500ML-% IV SOLN
INTRAVENOUS | Status: DC | PRN
Start: 2023-07-29 — End: 2023-07-29
  Administered 2023-07-29: 500 mL

## 2023-07-29 MED ORDER — LIDOCAINE HCL (PF) 1 % IJ SOLN
INTRAMUSCULAR | Status: AC
  Filled 2023-07-29: qty 30

## 2023-07-29 SURGICAL SUPPLY — 3 items
CATH PALINDROME-P 19CM W/VT (CATHETERS) IMPLANT
GLIDEWIRE ADV .035X180CM (WIRE) IMPLANT
TRAY PV CATH (CUSTOM PROCEDURE TRAY) ×1 IMPLANT

## 2023-07-29 NOTE — Op Note (Signed)
    NAME: [name]    MRN: [id number] DOB: [birth year]    DATE OF OPERATION: 07/29/2023  PREOP DIAGNOSIS:    End stage renal disease  POSTOP DIAGNOSIS:    Same  PROCEDURE:    Right internal jugular tunneled dialysis catheter exchange.   SURGEON: Fonda FORBES Rim  ASSIST: None  ANESTHESIA: Local  EBL: 2ml  INDICATIONS:    [name] is a 63 y.o. male male in need of tunneled dialysis catheter exchange for nonfunctioning TDC. After discussing the risk and benefits, [name] elected to proceed.   FINDINGS:    19cm tunneled palindrome catheter positioned at the cavoatrial junction  TECHNIQUE:   Patient brought to the catheterization suite.  Laid in supine position.  He was prepped and draped in standard fashion a timeout was performed.  The case began with wiring of the previous tunneled dialysis catheter placed in the right internal jugular vein.  The wire was run into the inferior vena cava.  Next, local was brought to the field and the catheter tract anesthetized.  I used a pair of Kelly clamps to free the cuff which was well incorporated.  Next, the catheter was removed over the wire with pressure held at the right neck.  At this point, I changed my gloves, and placed a new corrected and 19 cm tunneled dialysis catheter over the wire, driving it over the wire to the cavoatrial junction.  The wire was removed.  The catheter was sewn in place using 2-0 nylon suture.  The catheter pulled and flushed nicely without issue.  The catheter was then infused with heparin .  A standard dressing was placed.  Impression: Successful exchange of 19 cm tunneled palindrome catheter.  Fonda FORBES Rim, MD [facility] DATE OF DICTATION:   07/29/2023
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
## 2023-08-01 ENCOUNTER — Encounter (HOSPITAL_COMMUNITY): Payer: Self-pay | Admitting: Vascular Surgery

## 2023-08-04 ENCOUNTER — Ambulatory Visit (HOSPITAL_COMMUNITY)
Admission: RE | Admit: 2023-08-04 | Discharge: 2023-08-04 | Disposition: A | Discharge status: Home / Self Care | Attending: Vascular Surgery | Admitting: Vascular Surgery

## 2023-08-04 ENCOUNTER — Encounter (HOSPITAL_COMMUNITY)
Admission: RE | Disposition: A | Discharge status: Home / Self Care | Payer: Self-pay | Source: Home / Self Care | Attending: Vascular Surgery

## 2023-08-04 ENCOUNTER — Other Ambulatory Visit: Payer: Self-pay

## 2023-08-04 DIAGNOSIS — Z992 Dependence on renal dialysis: Secondary | ICD-10-CM | POA: Insufficient documentation

## 2023-08-04 DIAGNOSIS — E1151 Type 2 diabetes mellitus with diabetic peripheral angiopathy without gangrene: Secondary | ICD-10-CM | POA: Insufficient documentation

## 2023-08-04 DIAGNOSIS — N186 End stage renal disease: Secondary | ICD-10-CM | POA: Diagnosis present

## 2023-08-04 DIAGNOSIS — E1122 Type 2 diabetes mellitus with diabetic chronic kidney disease: Secondary | ICD-10-CM | POA: Insufficient documentation

## 2023-08-04 DIAGNOSIS — I132 Hypertensive heart and chronic kidney disease with heart failure and with stage 5 chronic kidney disease, or end stage renal disease: Secondary | ICD-10-CM | POA: Diagnosis not present

## 2023-08-04 DIAGNOSIS — F17210 Nicotine dependence, cigarettes, uncomplicated: Secondary | ICD-10-CM | POA: Insufficient documentation

## 2023-08-04 DIAGNOSIS — I5022 Chronic systolic (congestive) heart failure: Secondary | ICD-10-CM | POA: Insufficient documentation

## 2023-08-04 DIAGNOSIS — T8249XA Other complication of vascular dialysis catheter, initial encounter: Secondary | ICD-10-CM | POA: Insufficient documentation

## 2023-08-04 HISTORY — PX: TUNNELLED CATHETER EXCHANGE: CATH118373

## 2023-08-04 LAB — GLUCOSE, CAPILLARY: Glucose-Capillary: 158 mg/dL — ABNORMAL HIGH (ref 70–99)

## 2023-08-04 SURGERY — TUNNELLED CATHETER EXCHANGE
Anesthesia: LOCAL | Site: Chest

## 2023-08-04 MED ORDER — LIDOCAINE HCL (PF) 1 % IJ SOLN
INTRAMUSCULAR | Status: DC | PRN
  Administered 2023-08-04: 15 mL

## 2023-08-04 MED ORDER — HEPARIN SODIUM (PORCINE) 1000 UNIT/ML IJ SOLN
INTRAMUSCULAR | Status: AC
  Filled 2023-08-04: qty 10

## 2023-08-04 MED ORDER — HEPARIN SODIUM (PORCINE) 1000 UNIT/ML IJ SOLN
INTRAMUSCULAR | Status: DC | PRN
  Administered 2023-08-04 (×2): 1900 [IU] via INTRAVENOUS

## 2023-08-04 MED ORDER — HEPARIN (PORCINE) IN NACL 1000-0.9 UT/500ML-% IV SOLN
INTRAVENOUS | Status: DC | PRN
  Administered 2023-08-04: 500 mL

## 2023-08-04 MED ORDER — LIDOCAINE HCL (PF) 1 % IJ SOLN
INTRAMUSCULAR | Status: AC
  Filled 2023-08-04: qty 30

## 2023-08-04 SURGICAL SUPPLY — 3 items
CATH PALINDROME-P 23 W/VT (CATHETERS) IMPLANT
GLIDEWIRE STIFF .35X180X3 HYDR (WIRE) IMPLANT
TRAY PV CATH (CUSTOM PROCEDURE TRAY) ×1 IMPLANT

## 2023-08-04 NOTE — Op Note (Signed)
 DATE OF SERVICE: 08/04/2023  PATIENT:  [name]  63 y.o. male  PRE-OPERATIVE DIAGNOSIS:  end-stage renal disease; TDC malfunction  POST-OPERATIVE DIAGNOSIS:  Same  PROCEDURE:   1) exchange of tunneled dialysis catheter (CPT [phone]) 2) established outpatient evaluation and management - level 3 (CPT 99213)  SURGEON:  Debby SAILOR. Magda, MD  ASSISTANT: none  ANESTHESIA:   local  ESTIMATED BLOOD LOSS: minimal  LOCAL MEDICATIONS USED:  LIDOCAINE    COUNTS: confirmed correct.  PATIENT DISPOSITION:  PACU - hemodynamically stable.   Delay start of Pharmacological VTE agent (>24hrs) due to surgical blood loss or risk of bleeding: no  INDICATION FOR PROCEDURE: [name] is a 63 y.o. male with ESRD on HD via TDC. His catheter is not working well. After careful discussion of risks, benefits, and alternatives the patient was offered [facility] exchange. The patient understood and wished to proceed.  OPERATIVE FINDINGS:  19cm exchanged for 23cm TDC. Good performance of TDC after exchange.  DESCRIPTION OF PROCEDURE: After identification of the patient in the pre-operative holding area, the patient was transferred to the operating room. The patient was positioned supine on the operating room table.  The right neck was prepped and draped in standard fashion. A surgical pause was performed confirming correct patient, procedure, and operative location.  The existing catheter was freed from the subcutaneous tissue with a gentle pull.  Infiltration of lidocaine  was made around the catheter exit site.  A zip wire was advanced into the inferior vena cava through the existing catheter.  The catheter was removed and manual pressure held on the internal jugular vein.  A new 23 cm catheter was then introduced over the wire with the assistance of stylette's into the sino caval junction.  The catheter was tested and found to work well.  The catheter was sutured to the skin with 2 nylon sutures.  Upon completion of  the case instrument and sharps counts were confirmed correct. The patient was transferred to the  PACU in good condition. I was present for all portions of the procedure.  PLAN: Okay to use catheter  [employer]. Magda, MD [facility] Phone Number: [phone] 08/04/2023 2:18 PM
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
## 2023-08-05 ENCOUNTER — Encounter (HOSPITAL_COMMUNITY): Payer: Self-pay | Admitting: Vascular Surgery

## 2023-08-15 ENCOUNTER — Ambulatory Visit: Admitting: Student

## 2023-08-15 NOTE — Progress Notes (Deleted)
   CC: Telephone appointment for incontinence supplies.  This is a telephone encounter between [name] and [name] on 08/15/2023 for ***. The visit was conducted with the patient located at {NAMES:[id number]::home} and [name] at [employer]. The patient's identity was confirmed using their DOB and current address. The {WHO:[id number]::patient,his/her legal guardian,***} has consented to being evaluated through a telephone encounter and understands the associated risks (an examination cannot be done and the patient may need to come in for an appointment) / benefits (allows the patient to remain at home, decreasing exposure to coronavirus). I personally spent {Numbers; [id number]} minutes on medical discussion.   HPI:  [name] is a 63 y.o. with PMH as below.   Please see A&P for assessment of the patient's acute and chronic medical conditions.   Past Medical History:  Diagnosis Date   Atherosclerosis of native arteries of extremities with gangrene, left leg (HCC)    ESRD on hemodialysis (HCC)    Gangrene (HCC)    right foot   GERD (gastroesophageal reflux disease)    HFrEF (heart failure with reduced ejection fraction) (HCC)    Hyperlipidemia    Hypertension    Infection of amputation stump, left lower extremity (HCC) 08/10/2021   Neuromuscular disorder (HCC)    diabetic neruopathy - hands   Osteomyelitis (HCC) 2010   left foot, s/p midfoot amputation   Osteomyelitis (HCC) 09/2013   RT BKA   Osteomyelitis of ankle or foot 05/2011   rt foot, s/p 5th ray amputation   PAD (peripheral artery disease) (HCC)    Pneumonia 2010   Retroperitoneal hematoma    05/08/2021 renal biopsy complicated by retroperitoneal hematoma. 5/9 - 05/20/2021 H/H 6.9/20.9.  Imaging revealed large retroperitoneal hematoma.  S/P 2 units packed cells H/H stabilized with final values of 11.3/36.4.   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    Patient {GC/GE:[id number]::discussed with,seen with} Dr. {WJFZD:[id number]::Fjryzw,Ojl,Z. Hoffman,Williams,Mullen,Narendra,Guilloud,Vincent}  [name] Sandhoff, MD  Internal Medicine Resident

## 2023-08-15 NOTE — Telephone Encounter (Signed)
 Pt has been contacted for a Telehealth visit to f/u with his incont supplies  ame: Nolawi, Kanady MRN: [id number]  Date: 08/15/2023 Status: Sch  Time: 2:45 PM Length: 30  Visit Type: TELEPHONE OFFICE VISIT [[id number]        Copied from CRM #[id number]. Topic: Clinical - Medication Prior Auth >> Aug 12, 2023  9:38 AM Mercer PEDLAR wrote: Reason for CRM: Delores, patient's family friend calling regarding prior auth for incontinence supplies. She stated that she checked with [facility], and [employer] and they stated that they are waiting on prior auth. Delores would like a callback with an update.  Callbac: [phone]

## 2023-08-15 NOTE — Progress Notes (Deleted)
 Patient was scheduled for telehealth visit today, however I was unable to reach patient for the Tele visit. I left a message on voicemail.

## 2023-08-19 ENCOUNTER — Other Ambulatory Visit (HOSPITAL_COMMUNITY): Payer: Self-pay

## 2023-08-19 ENCOUNTER — Other Ambulatory Visit: Payer: Self-pay | Admitting: Student

## 2023-08-19 ENCOUNTER — Telehealth: Payer: Self-pay | Admitting: *Deleted

## 2023-08-19 ENCOUNTER — Other Ambulatory Visit: Payer: Self-pay

## 2023-08-19 DIAGNOSIS — G629 Polyneuropathy, unspecified: Secondary | ICD-10-CM

## 2023-08-19 MED ORDER — PREGABALIN 25 MG PO CAPS
25.0000 mg | ORAL_CAPSULE | Freq: Every day | ORAL | 0 refills | Status: DC
  Filled 2023-08-19 – 2023-08-23 (×2): qty 30, 30d supply, fill #0

## 2023-08-19 NOTE — Telephone Encounter (Signed)
 I called pt's friend, Deloris. I told pt had a telehealth appt yesterday and the doctor tried to get in contact with the pt; this was probably the telephone call. Stated pt was probably in dialysis. Stated she will re-schedule appt for Mon Wed or Fri. Call transferred to scheduler - Telehealth Appt schedule for Mon 8/18 w/Dr Rihner (call her telephone # [phone]).

## 2023-08-19 NOTE — Telephone Encounter (Signed)
 Copied from CRM #[id number]. Topic: Clinical - Medical Advice >> Aug 19, 2023 10:41 AM Merlynn LABOR wrote: Reason for CRM: Delores called in regarding callback from doctor with patient. She stated she missed the call and would like for provider to callback. She can be reached at [phone].

## 2023-08-22 ENCOUNTER — Encounter (HOSPITAL_COMMUNITY): Payer: Self-pay

## 2023-08-22 ENCOUNTER — Ambulatory Visit (INDEPENDENT_AMBULATORY_CARE_PROVIDER_SITE_OTHER)

## 2023-08-22 ENCOUNTER — Other Ambulatory Visit (HOSPITAL_COMMUNITY): Payer: Self-pay

## 2023-08-22 DIAGNOSIS — I10 Essential (primary) hypertension: Secondary | ICD-10-CM

## 2023-08-22 DIAGNOSIS — R32 Unspecified urinary incontinence: Secondary | ICD-10-CM | POA: Diagnosis not present

## 2023-08-22 DIAGNOSIS — E1151 Type 2 diabetes mellitus with diabetic peripheral angiopathy without gangrene: Secondary | ICD-10-CM | POA: Diagnosis not present

## 2023-08-22 DIAGNOSIS — Z794 Long term (current) use of insulin: Secondary | ICD-10-CM

## 2023-08-22 NOTE — Assessment & Plan Note (Addendum)
 Recommending follow up appointment this week to evaluate HTN. Last BP in office was 174/72 Family was able to get aid about 1 month ago which is said to have improved things since the last appointment. Patient prescribed Coreg  6.25 mg BID, but patient, aid, and friend unsure if he is taking these medications daily -consider at in person appointment a 1 pill, once a day + have the pharmacy send medications packaged and organized to help with adherence.

## 2023-08-22 NOTE — Patient Instructions (Signed)
 Today we discussed the following medical conditions and plan:   I will sign incontinence order supply form Be seen in clinic this week for Diabetes and HTN medication management  We look forward to seeing you next time. Please call our clinic at [phone] if you have any questions or concerns. The best time to call is Monday-Friday from 9am-4pm, but there is someone available 24/7. If you need medication refills, please notify your pharmacy one week in advance and they will send us  a request.   Thank you for trusting me with your care. Wishing you the best!   [name], DO  [facility]

## 2023-08-22 NOTE — Assessment & Plan Note (Addendum)
 CC: application for incontinence supplies - diapers, bed pads, gloves, wipes. Patient is a double amputee and wheelchair bound with upper extremity neuropathy and weakness due to PAD and diabetic neuropathy and mild/moderate cognitive impairment. -form requiring signature by physician. Following up on Wednesday.

## 2023-08-22 NOTE — Progress Notes (Signed)
 [name] Telephone Encounter Continuity Care Appointment  HPI:  This telephone encounter was created for [name] on 08/22/2023 for the following purpose/cc ordering of incontinence supplies and aeroflow.   Last in-person clinic visit with Dr. Jolaine 06/22/2023 for pre-op  evaluation for cataract surgery. Uncontrolled diabetes and HTN. No-showed visit appointment 1 month after this appointment.   Past Medical History:  Past Medical History:  Diagnosis Date   Atherosclerosis of native arteries of extremities with gangrene, left leg (HCC)    ESRD on hemodialysis (HCC)    Gangrene (HCC)    right foot   GERD (gastroesophageal reflux disease)    HFrEF (heart failure with reduced ejection fraction) (HCC)    Hyperlipidemia    Hypertension    Infection of amputation stump, left lower extremity (HCC) 08/10/2021   Neuromuscular disorder (HCC)    diabetic neruopathy - hands   Osteomyelitis (HCC) 2010   left foot, s/p midfoot amputation   Osteomyelitis (HCC) 09/2013   RT BKA   Osteomyelitis of ankle or foot 05/2011   rt foot, s/p 5th ray amputation   PAD (peripheral artery disease) (HCC)    Pneumonia 2010   Retroperitoneal hematoma    05/08/2021 renal biopsy complicated by retroperitoneal hematoma. 5/9 - 05/20/2021 H/H 6.9/20.9.  Imaging revealed large retroperitoneal hematoma.  S/P 2 units packed cells H/H stabilized with final values of 11.3/36.4.   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  Patient stated no longer taking ASA Assessment & Plan Urinary incontinence, unspecified type CC: application for incontinence supplies -  diapers, bed pads, gloves, wipes. Patient is a double amputee and wheelchair bound with upper extremity neuropathy and weakness due to PAD and diabetic neuropathy and mild/moderate cognitive impairment. -form requiring signature by physician. Following up on Wednesday.  Type 2 diabetes mellitus with diabetic peripheral angiopathy without gangrene, with long-term current use of insulin  (HCC) Recommending follow up appointment within 1-2 weeks to evaluate Diabetes  Family was able to get aid about 1 month ago which is said to have improved things since the last appointment. Lab Results  Component Value Date   HGBA1C 9.5 (A) 06/22/2023   HGBA1C 9.3 (A) 12/22/2022   HGBA1C 8.7 (A) 03/03/2022       Latest Ref Rng & Units 06/02/2023    3:41 PM 03/26/2023    8:03 PM 03/18/2023    6:46 PM  BMP  Glucose 70 - 99 mg/dL 853  779  850   BUN 8 - 23 mg/dL 28  27  39   Creatinine 0.61 - 1.24 mg/dL 5.89  6.32  4.85   Sodium 135 - 145 mmol/L 134  135  138   Potassium 3.5 - 5.1 mmol/L 3.8  3.3  4.3   Chloride 98 - 111 mmol/L 99  101  109   CO2 22 - 32 mmol/L 27  24  19    Calcium  8.9 - 10.3 mg/dL 8.6  7.7  8.3     Medications: LISTED: 17 Units of LA insulin  nightly  Adherence to Medications: patient, aid, and friend stated that he is not taking insulin  nightly, he is taking  it more PRN Blood Sugars at home: No one has been checking patient blood sugars at home.  -with status of uncontrolled diabetes, non-adherence to medications, and this being a tele-health encounter, recommended follow up this week for medication counseling  -referral to endocrine placed 06/22/2023, currently processed and waiting for them to call patient about appointment. but patient can call Phone: [phone]  Hypertension, essential Recommending follow up appointment this week to evaluate HTN. Last BP in office was 174/72 Family was able to get aid about 1 month ago which is said to have improved things since the last  appointment. Patient prescribed Coreg  6.25 mg BID, but patient, aid, and friend unsure if he is taking these medications daily -consider at in person appointment a 1 pill, once a day + have the pharmacy send medications packaged and organized to help with adherence.    PLAN:  -signature required for incontinence supplies form. Following up on Wednesday   -f/u with patient this week regarding uncontrolled diabetes and HTN (Wednesday afternoon appointment works best)  Assessment / Plan / Recommendations:  Please see A&P under problem oriented charting for assessment of the patient's acute and chronic medical conditions.  As always, pt is advised that if symptoms worsen or new symptoms arise, they should go to an urgent care facility or to to ER for further evaluation.   Consent and Medical Decision Making:  Patient discussed with Dr. Trudy This is a telephone encounter between [name] + [name] (friend and primary [phone]) and [name] on 08/22/2023 for ordering of incontinence supplies and Aeroflow. The visit was conducted with the patient located at home with friend calling from car separate from patient and [name] at [facility]. The patient's identity was confirmed using their DOB and current address. The patient and friend has consented to being evaluated through a telephone encounter and understands the associated risks (an examination cannot be done and the patient may need to come in for an appointment) / benefits. Spoke with patient/patient's friend on phone for about 30 minute.

## 2023-08-22 NOTE — Assessment & Plan Note (Addendum)
 Recommending follow up appointment within 1-2 weeks to evaluate Diabetes  Family was able to get aid about 1 month ago which is said to have improved things since the last appointment. Lab Results  Component Value Date   HGBA1C 9.5 (A) 06/22/2023   HGBA1C 9.3 (A) 12/22/2022   HGBA1C 8.7 (A) 03/03/2022       Latest Ref Rng & Units 06/02/2023    3:41 PM 03/26/2023    8:03 PM 03/18/2023    6:46 PM  BMP  Glucose 70 - 99 mg/dL 853  779  850   BUN 8 - 23 mg/dL 28  27  39   Creatinine 0.61 - 1.24 mg/dL 5.89  6.32  4.85   Sodium 135 - 145 mmol/L 134  135  138   Potassium 3.5 - 5.1 mmol/L 3.8  3.3  4.3   Chloride 98 - 111 mmol/L 99  101  109   CO2 22 - 32 mmol/L 27  24  19    Calcium  8.9 - 10.3 mg/dL 8.6  7.7  8.3     Medications: LISTED: 17 Units of LA insulin  nightly  Adherence to Medications: patient, aid, and friend stated that he is not taking insulin  nightly, he is taking it more PRN Blood Sugars at home: No one has been checking patient blood sugars at home.  -with status of uncontrolled diabetes, non-adherence to medications, and this being a tele-health encounter, recommended follow up this week for medication counseling  -referral to endocrine placed 06/22/2023, currently processed and waiting for them to call patient about appointment. but patient can call Phone: [phone]

## 2023-08-23 ENCOUNTER — Other Ambulatory Visit: Payer: Self-pay

## 2023-08-23 ENCOUNTER — Other Ambulatory Visit (HOSPITAL_COMMUNITY): Payer: Self-pay

## 2023-08-23 ENCOUNTER — Telehealth: Payer: Self-pay

## 2023-08-23 NOTE — Telephone Encounter (Signed)
 Unable to reach pt to sch an appt, vm is not setup.

## 2023-08-24 ENCOUNTER — Encounter: Admitting: Student

## 2023-08-24 NOTE — Telephone Encounter (Signed)
 Please see message below as the patient did not pick up the phone when called.   Copied from CRM #[id number]. Topic: Clinical - Medical Advice >> Aug 19, 2023 10:41 AM Merlynn LABOR wrote: Reason for CRM: Delores called in regarding callback from doctor with patient. She stated she missed the call and would like for provider to callback. She can be reached at [phone]. >> Aug 23, 2023  4:10 PM Miquel SAILOR wrote: Patient returning call. Called and transferred to [facility]

## 2023-08-25 ENCOUNTER — Telehealth: Payer: Self-pay | Admitting: *Deleted

## 2023-08-25 NOTE — Telephone Encounter (Signed)
 Will forward to C. Boone. Copied from CRM #[id number]. Topic: General - Other >> Aug 25, 2023  1:09 PM Carmell SAUNDERS wrote: Reason for CRM: Delores Barner called to check the status of the wheelchair request that has been ongoing for months now. Would like to get an update from Lela when she is available, or anyone clinical who can provide what the current status is on this and how she can proceed. [phone].

## 2023-08-30 ENCOUNTER — Encounter: Payer: Self-pay | Admitting: Student

## 2023-08-30 ENCOUNTER — Other Ambulatory Visit (HOSPITAL_COMMUNITY): Payer: Self-pay

## 2023-08-30 NOTE — Progress Notes (Signed)
 This encounter was created in error - please disregard.

## 2023-08-31 ENCOUNTER — Ambulatory Visit

## 2023-08-31 VITALS — BP 184/95 | HR 80 | Temp 98.0°F | Ht <= 58 in | Wt 162.0 lb

## 2023-08-31 DIAGNOSIS — I10 Essential (primary) hypertension: Secondary | ICD-10-CM

## 2023-08-31 DIAGNOSIS — E1151 Type 2 diabetes mellitus with diabetic peripheral angiopathy without gangrene: Secondary | ICD-10-CM | POA: Diagnosis not present

## 2023-08-31 DIAGNOSIS — R32 Unspecified urinary incontinence: Secondary | ICD-10-CM

## 2023-08-31 DIAGNOSIS — Z89511 Acquired absence of right leg below knee: Secondary | ICD-10-CM | POA: Diagnosis not present

## 2023-08-31 DIAGNOSIS — Z794 Long term (current) use of insulin: Secondary | ICD-10-CM

## 2023-08-31 NOTE — Progress Notes (Signed)
 Established Patient Office Visit  Subjective   Patient ID: [name], male    DOB: March 31, 1960  Age: 63 y.o. MRN: [id number]  Chief Complaint  Patient presents with   DME    Incontinence supples     [name] is a 63 year old male with a past medical history of type 2 diabetes mellitus, hypertension, urinary incontinence who presents today to discuss his needs for DME supplies for incontinence and a new wheelchair.  He is also supposed to discuss his diabetes management today.  Patient did not bring paperwork for DME supplies for urinary incontinence, so this will be unable to be done today. Appointment was limited due to the fact that patient has difficulty remembering which medications he takes, as he was not with a family ember that could assist today.  Patient does not know if for which hypertensive medications that he is on, and is only able to tell me that he takes the same insulin  pen 3 times a day.  Two 7 unit injections after meals, and one 17 unit injection at the end of the night before he goes to sleep.  He states that his grandkids father, [name], helps his with his medications.  His sister [name] was called during the appointment for further clarification, however she was also unsure of which medications that the patient takes.  She will be called by the pharmacist later today to go over all of his medicines.    Review of Systems  Constitutional:  Negative for chills and fever.  Respiratory:  Negative for shortness of breath.   Gastrointestinal:  Negative for abdominal pain, heartburn, nausea and vomiting.  Neurological:  Negative for dizziness.      Objective:     BP (!) 184/95 (BP Location: Left Arm, Patient Position: Sitting, Cuff Size: Normal)   Pulse 80   Temp 98 F (36.7 C) (Oral)   Ht 4' 2 (1.27 m)   Wt 162 lb (73.5 kg)   SpO2 98%   BMI 45.56 kg/m  Physical Exam Constitutional:      Appearance: Normal appearance.  Cardiovascular:     Rate and Rhythm:  Normal rate and regular rhythm.     Pulses: Normal pulses.     Heart sounds: Normal heart sounds.  Pulmonary:     Effort: Pulmonary effort is normal.     Breath sounds: Normal breath sounds.  Abdominal:     General: Abdomen is flat. Bowel sounds are normal.     Palpations: Abdomen is soft.  Musculoskeletal:     Comments: Bilateral below the knee amputations.  Skin:    General: Skin is warm and dry.  Neurological:     General: No focal deficit present.     Mental Status: He is alert and oriented to person, place, and time.  Psychiatric:        Mood and Affect: Mood normal.        Behavior: Behavior normal.        Assessment & Plan:   Patient seen with Dr. Ronnald Sergeant.   Urinary Incontinence Patient previously applying for incontinence supplies, including diapers, bed pads, gloves, wipes.  Patient is a double amputee and is wheelchair-bound with upper extremity neuropathy and weakness due to his diabetic neuropathy and mild cognitive impairment.  Patient did not bring application form to help with assistance supplies, will talk with [name] about getting the appropriate paperwork filled out for him. - DME referral pending  Type 2 Diabetes Mellitus with diabetic peripheral  angiopathy without gangrene and long-term current use of insulin  Patient did not bring Dexcom to appointment today.  Endocrinology referral made, encouraging patient to call at [phone].  Patient states he takes 7 units after meals and 17unit before bed time.  Last A1c 9.5% 2 months ago.  Due to patient's inability to recall which insulin  type he is taking, will defer management today until he is able to bring a family member to discuss appropriate medication adherence.  Encouraged that his sister [name] speaks to our clinical pharmacist to discuss which medications he is on and reconcile them as appropriate. - [facility] referral   Hypertension Blood pressure in office today 185/94.  Repeat check shows 184/95.   Previously prescribed Coreg  6.2 mg twice daily, but states he did not take his medication this morning.  He is unsure when he takes it normally. - Encouraged taking antihypertensive medications.  Return in about 1 week (around 09/07/2023) for management of chronic medications, NEEDS family member to assist with visit.    [name] Bahl, DO Internal Medicine Resident, PGY-1 [facility] Internal Medicine Residency  4:27 PM 08/31/2023

## 2023-08-31 NOTE — Patient Instructions (Addendum)
 Thank you, [name] for allowing us  to provide your care today. Today we discussed your diabetes and need for a new wheelchair.  I will send the referral for your new wheelchair and to get you the supplies you need for your urinary incontinence. We will also like you to see Dr. Trudy to help with your chronic conditions and memory.   Our pharmacist, Dr. Lorain Baseman, will call your sister to discuss your medications and make sure we have the right ones.    My Chart Access: [email or web address]  Please follow-up in:  1 week to discuss your diabetes and correct your medicine    We look forward to seeing you next time. Please call our clinic at [phone] if you have any questions or concerns. The best time to call is Monday-Friday from 9am-4pm, but there is someone available 24/7. If after hours or the weekend, call the main hospital number and ask for the Internal Medicine Resident On-Call. If you need medication refills, please notify your pharmacy one week in advance and they will send us  a request.   Thank you for letting us  take part in your care. Wishing you the best!  Arlo Buffone, DO 08/31/2023, 3:24 PM [name] Internal Medicine Residency Program

## 2023-08-31 NOTE — Progress Notes (Signed)
 Internal Medicine Clinic Attending  Case discussed with the resident at the time of the visit.  We reviewed the resident's history and exam and pertinent patient test results.  I agree with the assessment, diagnosis, and plan of care documented in the resident's note.

## 2023-09-01 ENCOUNTER — Telehealth: Payer: Self-pay | Admitting: *Deleted

## 2023-09-01 NOTE — Telephone Encounter (Signed)
 Copied from CRM #[id number]. Topic: General - Other >> Aug 25, 2023  1:09 PM Carmell SAUNDERS wrote: Reason for CRM: [name] called to check the status of the wheelchair request that has been ongoing for months now. Would like to get an update from Lela when she is available, or anyone clinical who can provide what the current status is on this and how she can proceed. [phone]. >> Sep 01, 2023 10:23 AM Miquel SAILOR wrote: Patient [name]  needs update ASAP. Call back ASAP [phone]

## 2023-09-02 NOTE — Progress Notes (Signed)
 Internal Medicine Clinic Attending  I was physically present during the key portions of the resident provided service and participated in the medical decision making of patient's management care. I reviewed pertinent patient test results.  The assessment, diagnosis, and plan were formulated together and I agree with the documentation in the resident's note.  History of cognitive impairment and possible dementia mentioned throughout the chart without clear diagnosis. [name] has difficulty remembering his medications and is unsure of his current regimen. Both short and long-acting insulin  listed on his chart, although patient reports he only takes one type of insulin  both after meals and at night for basal coverage. No glucose data to help guide decision making today. Did not take BP medications today. We will need to clarify his current medications and administration prior to making changes. We spoke with patient's sister, [name], and recommended [name] return in one week with family present to assist. We also discussed referral to geriatrics clinic for further clarification of dementia diagnosis.  Karna Fellows, MD

## 2023-09-02 NOTE — Telephone Encounter (Signed)
 Delores, patient's friend, calling for update. I informed her that Referral Dme has been placed awaiting signed DME Form placed in the box. She would like callback to notify her when it is signed. Callback: [phone]

## 2023-09-02 NOTE — Addendum Note (Signed)
 Addended by: KARNA FELLOWS on: 09/02/2023 11:34 AM   Modules accepted: Level of Service

## 2023-09-06 ENCOUNTER — Other Ambulatory Visit (HOSPITAL_COMMUNITY): Payer: Self-pay

## 2023-09-07 ENCOUNTER — Encounter: Payer: Self-pay | Admitting: Student

## 2023-09-08 ENCOUNTER — Telehealth: Payer: Self-pay | Admitting: *Deleted

## 2023-09-08 ENCOUNTER — Encounter: Admitting: Student

## 2023-09-08 NOTE — Progress Notes (Signed)
 Complex Care Management Note  Care Guide Note 09/08/2023 Name: [name] MRN: [id number] DOB: July 23, 1960  [name] is a 63 y.o. year old male who sees Jolaine Pac, DO for primary care. I reached out to Rutha Moats by phone today to offer complex care management services.  [name] was given information about Complex Care Management services today including:   The Complex Care Management services include support from the care team which includes your Nurse Care Manager, Clinical Social Worker, or Pharmacist.  The Complex Care Management team is here to help remove barriers to the health concerns and goals most important to you. Complex Care Management services are voluntary, and the patient may decline or stop services at any time by request to their care team member.   Complex Care Management Consent Status: Patient agreed to services and verbal consent obtained.   Follow up plan:  Telephone appointment with complex care management team member scheduled for:  9/15  Encounter Outcome:  Patient Scheduled  Harlene Satterfield  [facility]  [facility] Guide  Direct Dial : [phone]  Fax [phone]

## 2023-09-09 ENCOUNTER — Other Ambulatory Visit (HOSPITAL_COMMUNITY): Payer: Self-pay

## 2023-09-09 NOTE — Telephone Encounter (Signed)
 I called pt's sister to ask which medications pt is requesting - no answer, left message to call the office.

## 2023-09-12 NOTE — Telephone Encounter (Signed)
  ameRico, Massar MRN: [id number]  Date: 09/30/2023 Status: Sch  Time: 8:45 AM Length: 30  Visit Type: OFFICE VISIT [8002] Copay: $0.00  Provider: Jolaine Pac, DO Department: [facility]  Referring Provider: JOLAINE PAC CSN: [id number]  Notes: 3 MONTH DIABETES A1C / BP CHECK /PLEASE RECHECK BP BEFORE LEAVING THE ROOM /PER DORIS .unable to confirm/LMR 9/3 k   Copied from CRM #[id number]. Topic: Appointments - Appointment Cancel/Reschedule >> Sep 12, 2023  2:10 PM Miquel SAILOR wrote: Patient/patient representative is calling to cancel or reschedule an appointment. Refer to attachments for appointment information.   Patient Ronal calling to reschedule app for Pt on 09/18 due to he has Dialysis. Called and transferred to office due to no schedule showing

## 2023-09-15 ENCOUNTER — Other Ambulatory Visit (HOSPITAL_COMMUNITY): Payer: Self-pay

## 2023-09-15 ENCOUNTER — Other Ambulatory Visit: Payer: Self-pay

## 2023-09-15 ENCOUNTER — Other Ambulatory Visit: Payer: Self-pay | Admitting: Student

## 2023-09-15 DIAGNOSIS — G629 Polyneuropathy, unspecified: Secondary | ICD-10-CM

## 2023-09-15 MED ORDER — PREGABALIN 25 MG PO CAPS
25.0000 mg | ORAL_CAPSULE | Freq: Every day | ORAL | 0 refills | Status: DC
  Filled 2023-09-15: qty 30, 30d supply, fill #0

## 2023-09-16 ENCOUNTER — Other Ambulatory Visit (HOSPITAL_COMMUNITY): Payer: Self-pay

## 2023-09-19 ENCOUNTER — Other Ambulatory Visit (INDEPENDENT_AMBULATORY_CARE_PROVIDER_SITE_OTHER)

## 2023-09-19 DIAGNOSIS — I214 Non-ST elevation (NSTEMI) myocardial infarction: Secondary | ICD-10-CM

## 2023-09-19 NOTE — Progress Notes (Unsigned)
 09/19/2023 Name: [name] MRN: [id number] DOB: 07-12-1960  Chief Complaint  Patient presents with   Diabetes    [name] is a 63 y.o. year old male who presented for a telephone visit. It was requested that I speak with his sister [name], who helps manage his medications.   They were referred to the pharmacist by their PCP for assistance in managing HTN, diabetes and complex medication management. [name] PMH includes HTN, PVD s/p BKA, HFrEF (30-35% in August 2023), T2DM, CAD s/p NSTEMI (2023), GERD, MCI, ESRD on dialysis, HLD, tobacco use disorder.   Subjective: Patient was last seen by PCP, [name] Amilibia, DO, on 08/31/23. At last visit, BP was 184/95 mmHg, HR 80 bpm in the setting of not taking his BP medications that day. Throughout the visit patient reported difficulty remembering which medications he takes and no family was present. He states that he uses the same insulin  pen three times per day (7 units after meals and 17 units before bed). Due to difficult determining medication regimen, no changes were made. Per dialysis notes, patient misses sessions frequently, and often leaves early.   Today, I spoke with patient's sister, [name], who helps the patient organize his medications. The patient was not on the line. She has taken pictures of the medications that she has at home and reviews them with me today. They have carvedilol , ezetimibe , pregabalin , rosuvastatin , and Lantus . She reports that she believes he is only taking Lantus  once nightly. She reports that she does not believe he has a Humalog  pen at home and does not think he is taking insulin  with meals, despite what he reported to PCP at last visit.. She reports he is wearing a Dexcom sensor but she does not believe it is connected to anything and he would benefit from assistance setting this up at follow-up.She reports that he is not taking aspirin  81 mg.   Care Team: Primary Care Provider: Jolaine Pac, DO ; Next Scheduled  Visit: 09/30/23 Dialysis at [facility]  Medication Access/Adherence  Current Pharmacy:  [place] - [facility] [address], [address] [place] [address] Phone: [phone] Fax: [phone]  [name] - [facility] [address] [place] [address] Phone: [phone] Fax: [phone]  [id number] [place], [place] - [address] AT [facility] & [address] [place] [address] Phone: [phone] Fax: [phone]   Patient reports affordability concerns with their medications: No  Patient reports access/transportation concerns to their pharmacy: Yes  - would benefit from mail delivery, patient's sister does not have capacity to be full-time caretaker, but helps organize his medications as she is able to. Patient reports adherence concerns with their medications:  Yes  - patient does not have a good understanding of his medication regimen.   Diabetes:  Current medications: Lantus  15 units at bedtime (patient reported taking 17 units at last PCP visit), Humalog  5 units TID with meals (Patient reported taking 7 units after meals using the same pen that he takes 17 units of - however his sister reported today that he is only taking Lantus  at night)  Current glucose readings: n/a - patient is not connected in Clarity but has current supply of Dexcom G7 sensors per fill hx. Will request to connect him to clarity at PCP f/u on 09/30/23.  Current medication access support: [employer] Dual Complete  Hypertension:  Current medications: carvedilol  6.25 mg  BID Previously tried: amlodipine  10 mg daily (last dispensed 07/20/21)  Patient has and, automated, upper arm home BP cuff - not checking at home because it is checked frequently at dialysis   Medication Management:  Current adherence strategy: Patient's sister fills up pill box for him  Patient reports Fair adherence to  medications  Patient reports the following barriers to adherence: limited monitoring  Objective:  BP Readings from Last 3 Encounters:  08/31/23 (!) 184/95  08/04/23 (!) 158/77  07/29/23 (!) 172/86    Lab Results  Component Value Date   HGBA1C 9.5 (A) 06/22/2023   HGBA1C 9.3 (A) 12/22/2022   HGBA1C 8.7 (A) 03/03/2022       Latest Ref Rng & Units 06/02/2023    3:41 PM 03/26/2023    8:03 PM 03/18/2023    6:46 PM  BMP  Glucose 70 - 99 mg/dL 853  779  850   BUN 8 - 23 mg/dL 28  27  39   Creatinine 0.61 - 1.24 mg/dL 5.89  6.32  4.85   Sodium 135 - 145 mmol/L 134  135  138   Potassium 3.5 - 5.1 mmol/L 3.8  3.3  4.3   Chloride 98 - 111 mmol/L 99  101  109   CO2 22 - 32 mmol/L 27  24  19    Calcium  8.9 - 10.3 mg/dL 8.6  7.7  8.3     Lab Results  Component Value Date   CHOL 77 08/19/2021   HDL 31 (L) 08/19/2021   LDLCALC 38 08/19/2021   TRIG 38 08/19/2021   CHOLHDL 2.5 08/19/2021    Medications Reviewed Today     Reviewed by [name], RPH (Pharmacist) on 09/19/23 at 1511  Med List Status: <None>   Medication Order Taking? Sig Documenting Provider Last Dose Status Informant  Accu-Chek Softclix Lancets lancets [id number]  Use to check blood sugar 3 (three) times daily. [name] Boga, MD  Active   aspirin  EC 81 MG tablet [id number]  Take 1 tablet (81 mg total) by mouth daily. Swallow whole.  Patient not taking: Reported on 09/19/2023   [name] Boga, MD  Active   Blood Glucose Monitoring Suppl (ACCU-CHEK GUIDE) w/Device KIT [id number]  Use to check your blood sugar three times daily. [name] Boga, MD  Active   carvedilol  (COREG ) 6.25 MG tablet [id number] Yes Take 1 tablet (6.25 mg total) by mouth 2 (two) times daily with meals for hypertension [name] Pac, DO  Active   Continuous Glucose Sensor (DEXCOM G7 SENSOR) MISC [id number] Yes Place new sensor every 10 days. Use to monitor blood sugar continuously. [name] Pac, DO  Active   diclofenac  Sodium  (VOLTAREN ) 1 % GEL [id number]  Apply 4 g topically 4 (four) times daily. [name] Pac, DO  Active     Discontinued 09/19/23 1342 (Patient has not taken in last 30 days)   ezetimibe  (ZETIA ) 10 MG tablet [id number] Yes Take 1 tablet (10 mg total) by mouth daily. [name] Boga, MD  Active   glucose blood (ACCU-CHEK AVIVA PLUS) test strip [id number]  Check blood sugar 3 times a day [name] Boga, MD  Active   insulin  glargine (LANTUS  SOLOSTAR) 100 UNIT/ML Solostar Pen [id number] Yes Inject 15 Units into the skin at bedtime. Stephanie Freund, MD  Active   insulin  lispro (HUMALOG ) 100 UNIT/ML KwikPen [id number]  Inject 5 Units into the skin 3 (three) times daily with meals.  Patient not taking: Reported on 09/19/2023   Katsadouros, Vasilios, MD  Active  Insulin  Pen Needle 32G X 4 MM MISC [id number]  Use to inject insulin  4 (four) times daily. [name] Boga, MD  Active   melatonin (KP MELATONIN) 3 MG TABS tablet [id number]  Take 1 tablet (3 mg total) by mouth at bedtime for insomnia.   Active   ofloxacin  (OCUFLOX ) 0.3 % ophthalmic solution [id number]  Place 1 drop into the left eye 4 (four) times daily for 1 week, then stop.   Active   prednisoLONE  acetate (PRED FORTE ) 1 % ophthalmic suspension [id number]  Instill 1 drop into left eye 4 times a day for 1 week, then 2  times a day for 1 week, then once daily for 2 weeks then STOP   Active   pregabalin  (LYRICA ) 25 MG capsule [id number] Yes Take 1 capsule by mouth once a day [name] Pac, DO  Active   rosuvastatin  (CRESTOR ) 10 MG tablet [id number] Yes Take 1 tablet (10 mg total) by mouth daily. [name] Boga, MD  Active               Assessment/Plan:   Diabetes: - Currently controlled with most recent A1C of 9.5% above goal <7%. Could consider A1C goal < 8% given co morbidities, however need to assess glycemic control via CGM monitoring first.  - Reviewed [name] term cardiovascular and renal outcomes of uncontrolled blood sugar -  Reviewed goal A1c, goal fasting, and goal 2 hour post prandial glucose - Reviewed hypoglycemia management plan and the rule of 15 - Recommend to continue Lantus  17 units nightly.  - At PCP f/u, attempt to obtain glucose data from [facility] G7 and/or facilitate connection to Clarity account. Once more data is available, determine need to re-initiate prandial insulin . - Next A1C due 09/22/23     Hypertension: - Currently uncontrolled with clinic BP consistently above goal less than 130/80. Will defer to nephrology to determine whether additional BP control is needed - unable to view recent BP at dialysis to determine whether escalation in therapy is needed. Previously on amlodipine  - may be appropriate to restart. - Recommend to continue carvedilol  6.25 mg BID    Medication Management: - Currently strategy insufficient to maintain appropriate adherence to prescribed medication regimen - Following upcoming PCP appt - will transition patient to compliance packaging from [facility] pharmacy. Recently was dispensed 90ds of most medications in August 2025.  - Patient does not have current supply of aspirin  81 mg daily. Will send 30ds to pharmacy, and then plan to incorporate in pill pack.    Follow Up Plan:  Pharmacist telephone 10/03/23 PCP clinic visit 09/30/23   Lorain Baseman, PharmD [facility] [phone]

## 2023-09-20 ENCOUNTER — Other Ambulatory Visit (HOSPITAL_COMMUNITY): Payer: Self-pay

## 2023-09-20 ENCOUNTER — Other Ambulatory Visit: Payer: Self-pay

## 2023-09-20 ENCOUNTER — Encounter: Payer: Self-pay | Admitting: Pharmacist

## 2023-09-20 MED ORDER — ASPIRIN 81 MG PO TBEC
81.0000 mg | DELAYED_RELEASE_TABLET | Freq: Every day | ORAL | 11 refills | Status: AC
  Filled 2023-09-20 – 2023-10-27 (×2): qty 30, 30d supply, fill #0
  Filled 2023-11-21: qty 30, 30d supply, fill #1
  Filled 2023-12-15 – 2024-02-03 (×2): qty 30, 30d supply, fill #2

## 2023-09-21 ENCOUNTER — Other Ambulatory Visit (HOSPITAL_COMMUNITY): Payer: Self-pay

## 2023-09-22 ENCOUNTER — Encounter: Admitting: Student

## 2023-09-23 ENCOUNTER — Other Ambulatory Visit: Payer: Self-pay

## 2023-09-23 ENCOUNTER — Other Ambulatory Visit: Payer: Self-pay | Admitting: Student

## 2023-09-23 ENCOUNTER — Other Ambulatory Visit (HOSPITAL_COMMUNITY): Payer: Self-pay

## 2023-09-23 DIAGNOSIS — I214 Non-ST elevation (NSTEMI) myocardial infarction: Secondary | ICD-10-CM

## 2023-09-26 ENCOUNTER — Other Ambulatory Visit (HOSPITAL_COMMUNITY): Payer: Self-pay

## 2023-09-26 ENCOUNTER — Other Ambulatory Visit: Payer: Self-pay

## 2023-09-26 MED ORDER — INSULIN LISPRO (1 UNIT DIAL) 100 UNIT/ML (KWIKPEN)
5.0000 [IU] | PEN_INJECTOR | Freq: Three times a day (TID) | SUBCUTANEOUS | 3 refills | Status: DC
  Filled 2023-09-26: qty 15, 100d supply, fill #0

## 2023-09-26 MED ORDER — MELATONIN 3 MG PO TABS
3.0000 mg | ORAL_TABLET | Freq: Every day | ORAL | 0 refills | Status: AC
  Filled 2023-09-26 – 2024-02-07 (×4): qty 60, 60d supply, fill #0

## 2023-09-26 MED ORDER — ROSUVASTATIN CALCIUM 10 MG PO TABS
10.0000 mg | ORAL_TABLET | Freq: Every day | ORAL | 2 refills | Status: AC
  Filled 2023-09-26: qty 90, 90d supply, fill #0
  Filled 2023-10-27: qty 30, 30d supply, fill #0
  Filled 2023-11-21: qty 30, 30d supply, fill #1
  Filled 2023-12-15 – 2023-12-28 (×2): qty 30, 30d supply, fill #2
  Filled 2024-01-25: qty 30, 30d supply, fill #3
  Filled 2024-02-03: qty 30, 30d supply, fill #4

## 2023-09-27 ENCOUNTER — Encounter: Payer: Self-pay | Admitting: Pharmacist

## 2023-09-27 ENCOUNTER — Other Ambulatory Visit: Payer: Self-pay

## 2023-09-30 ENCOUNTER — Other Ambulatory Visit (HOSPITAL_COMMUNITY): Payer: Self-pay

## 2023-09-30 ENCOUNTER — Ambulatory Visit: Admitting: Student

## 2023-09-30 ENCOUNTER — Other Ambulatory Visit: Payer: Self-pay

## 2023-09-30 VITALS — BP 168/82 | HR 76 | Temp 98.3°F | Ht <= 58 in | Wt 102.2 lb

## 2023-09-30 DIAGNOSIS — G629 Polyneuropathy, unspecified: Secondary | ICD-10-CM

## 2023-09-30 DIAGNOSIS — Z23 Encounter for immunization: Secondary | ICD-10-CM

## 2023-09-30 DIAGNOSIS — I10 Essential (primary) hypertension: Secondary | ICD-10-CM | POA: Diagnosis not present

## 2023-09-30 DIAGNOSIS — Z794 Long term (current) use of insulin: Secondary | ICD-10-CM | POA: Diagnosis not present

## 2023-09-30 DIAGNOSIS — E1151 Type 2 diabetes mellitus with diabetic peripheral angiopathy without gangrene: Secondary | ICD-10-CM | POA: Diagnosis not present

## 2023-09-30 DIAGNOSIS — E782 Mixed hyperlipidemia: Secondary | ICD-10-CM | POA: Diagnosis not present

## 2023-09-30 MED ORDER — AMLODIPINE BESYLATE 5 MG PO TABS
5.0000 mg | ORAL_TABLET | Freq: Every day | ORAL | 1 refills | Status: DC
  Filled 2023-09-30: qty 30, 30d supply, fill #0
  Filled 2023-10-27: qty 30, 30d supply, fill #1

## 2023-09-30 MED ORDER — PREGABALIN 25 MG PO CAPS
25.0000 mg | ORAL_CAPSULE | Freq: Every day | ORAL | 0 refills | Status: DC
  Filled 2023-09-30: qty 30, 30d supply, fill #0

## 2023-09-30 NOTE — Assessment & Plan Note (Signed)
 Patient returns alone for follow-up and similar to previous visits it is difficult to understand which insulins he is taking at home.  He is consistent in that he takes 17 units of what he thinks is Lantus  before bed but also says he takes 11 units of this at dinner.  His sister on the phone says that he takes 2 different insulins.  I attempted to call his aunt Clarina who I have added to his contact list as she goes over to his house to help with the medications almost daily.  I was unable to reach her but we will plan to do a very thorough medication review hopefully both over the phone with the patient and either his sister and or aunt as well as in person.  He is instructed to bring all of his medications to his next appointment.  We drew an A1c today and we will discuss it at her next visit.  For now we will continue current medications without changes.

## 2023-09-30 NOTE — Progress Notes (Signed)
 CC: Routine Follow Up for management of chronic medical conditions after last office visit 08/31/2023  HPI:  [name] is a 63 y.o. male with pertinent PMH of HTN, HFrEF, T2DM, ESRD on HD, CAD with prior STEMI, anemia of chronic disease, and mild cognitive impairment who presents as above. Please see assessment and plan below for further details.  Medications: Current Outpatient Medications  Medication Instructions   Accu-Chek Softclix Lancets lancets Use to check blood sugar 3 (three) times daily.   amLODipine  (NORVASC ) 5 mg, Oral, Daily   aspirin  EC 81 mg, Oral, Daily, Swallow whole.   Blood Glucose Monitoring Suppl (ACCU-CHEK GUIDE) w/Device KIT Use to check your blood sugar three times daily.   carvedilol  (COREG ) 6.25 MG tablet Take 1 tablet (6.25 mg total) by mouth 2 (two) times daily with meals for hypertension   Continuous Glucose Sensor (DEXCOM G7 SENSOR) MISC Place new sensor every 10 days. Use to monitor blood sugar continuously.   diclofenac  Sodium (VOLTAREN ) 4 g, Topical, 4 times daily   ezetimibe  (ZETIA ) 10 mg, Oral, Daily   glucose blood (ACCU-CHEK AVIVA PLUS) test strip Check blood sugar 3 times a day   insulin  lispro (HUMALOG ) 5 Units, Subcutaneous, 3 times daily with meals   Insulin  Pen Needle 32G X 4 MM MISC Use to inject insulin  4 (four) times daily.   Lantus  SoloStar 15 Units, Subcutaneous, Daily at bedtime   melatonin (KP MELATONIN) 3 MG TABS tablet Take 1 tablet (3 mg total) by mouth at bedtime for insomnia.   ofloxacin  (OCUFLOX ) 0.3 % ophthalmic solution Place 1 drop into the left eye 4 (four) times daily for 1 week, then stop.   prednisoLONE  acetate (PRED FORTE ) 1 % ophthalmic suspension Instill 1 drop into left eye 4 times a day for 1 week, then 2  times a day for 1 week, then once daily for 2 weeks then STOP   pregabalin  (LYRICA ) 25 MG capsule Take 1 capsule by mouth once a day   rosuvastatin  (CRESTOR ) 10 mg, Oral, Daily     Review of Systems:   Pertinent  items noted in HPI and/or A&P.  Physical Exam:  Vitals:   09/30/23 0913 09/30/23 0939  BP: (!) 172/83 (!) 168/82  Pulse: 76 76  Temp: 98.3 F (36.8 C)   TempSrc: Oral   SpO2: 97%   Weight: 102 lb 3.2 oz (46.4 kg)   Height: 4' 2 (1.27 m)     Constitutional: Chronically ill-appearing male in a wheelchair. In no acute distress. HEENT: Normocephalic, atraumatic, Sclera non-icteric, PERRL, EOM intact Cardio:Regular rate and rhythm.  Pulm:Clear to auscultation bilaterally. Normal work of breathing on room air. MSK: Stable right BKA and left AKA Skin:Warm and dry.   Assessment & Plan:   Assessment & Plan Type 2 diabetes mellitus with diabetic peripheral angiopathy without gangrene, with long-term current use of insulin  [facility]) Patient returns alone for follow-up and similar to previous visits it is difficult to understand which insulins he is taking at home.  He is consistent in that he takes 17 units of what he thinks is Lantus  before bed but also says he takes 11 units of this at dinner.  His sister on the phone says that he takes 2 different insulins.  I attempted to call his aunt Clarina who I have added to his contact list as she goes over to his house to help with the medications almost daily.  I was unable to reach her but we will plan to do  a very thorough medication review hopefully both over the phone with the patient and either his sister and or aunt as well as in person.  He is instructed to bring all of his medications to his next appointment.  We drew an A1c today and we will discuss it at her next visit.  For now we will continue current medications without changes. Hypertension, essential Blood pressure remains significantly elevated at 168/82.  He has previously been 170 systolic plus despite taking his medications.  He has been on amlodipine  in the past without issue. - Start amlodipine  5 mg daily and continue carvedilol  6.25 mg twice daily Mixed hyperlipidemia Lipid panel  today and will discuss any medication changes at his next visit. - Continue rosuvastatin  10 mg daily Encounter for immunization Received annual flu vaccine today. Neuropathy Refilled pregabalin  25 mg daily for chronic neuropathy.  Orders Placed This Encounter  Procedures   Flu vaccine trivalent PF, 6mos and older(Flulaval,Afluria,Fluarix,Fluzone)   Lipid Profile   Hemoglobin A1c     Patient discussed with Dr. Dayton Eastern  Fairy Pool, DO [facility] Internal [facility] Resident PGY-3 Clinic Phone: [phone] Please contact the on call pager at [phone] for any urgent or emergent needs.

## 2023-09-30 NOTE — Assessment & Plan Note (Signed)
 Refilled pregabalin  25 mg daily for chronic neuropathy.

## 2023-09-30 NOTE — Assessment & Plan Note (Signed)
 Lipid panel today and will discuss any medication changes at his next visit. - Continue rosuvastatin  10 mg daily

## 2023-09-30 NOTE — Patient Instructions (Signed)
 Thank you, RyanRyan [name], for allowing us  to provide your care today. Today we discussed . . .  > Diabetes       - As we talked about it is difficult for us  to make any changes to your medications if we do not know exactly which insulins you are taking.  I will try to call your aunt and discuss which ones you are taking but I think the most important thing is for you to bring all of your medications with you to your next appointment.  We are going to check an A1c today and if we are making any changes I will call you and your aunt or sister to discuss this. > Hypertension       - Your blood pressure is still elevated and I would like to add on a medication called amlodipine .  You have taken this medication before.  We are starting at a low dose of 5 mg daily.  Please bring all of your medications with you to your next visit so we can review them together and work on getting you pill packs that make take your medications much easier.  I have ordered the following labs for you:   Lab Orders         Lipid Profile         Hemoglobin A1c       Follow up: 4 weeks    Remember:  Should you have any questions or concerns please call the internal medicine clinic at [phone].     Fairy Pool, DO [facility]

## 2023-09-30 NOTE — Assessment & Plan Note (Signed)
 Blood pressure remains significantly elevated at 168/82.  He has previously been 170 systolic plus despite taking his medications.  He has been on amlodipine  in the past without issue. - Start amlodipine  5 mg daily and continue carvedilol  6.25 mg twice daily

## 2023-10-01 LAB — LIPID PANEL
Chol/HDL Ratio: 2.2 ratio (ref 0.0–5.0)
Cholesterol, Total: 67 mg/dL — ABNORMAL LOW (ref 100–199)
HDL: 31 mg/dL — ABNORMAL LOW (ref >39)
LDL Chol Calc (NIH): 24 mg/dL (ref 0–99)
Triglycerides: 40 mg/dL (ref 0–149)
VLDL Cholesterol Cal: 12 mg/dL (ref 5–40)

## 2023-10-01 LAB — HEMOGLOBIN A1C
Est. average glucose Bld gHb Est-mCnc: 186 mg/dL
Hgb A1c MFr Bld: 8.1 % — ABNORMAL HIGH (ref 4.8–5.6)

## 2023-10-03 ENCOUNTER — Other Ambulatory Visit: Payer: Self-pay

## 2023-10-03 ENCOUNTER — Other Ambulatory Visit

## 2023-10-03 ENCOUNTER — Other Ambulatory Visit (HOSPITAL_COMMUNITY): Payer: Self-pay

## 2023-10-03 DIAGNOSIS — E1151 Type 2 diabetes mellitus with diabetic peripheral angiopathy without gangrene: Secondary | ICD-10-CM

## 2023-10-03 DIAGNOSIS — Z794 Long term (current) use of insulin: Secondary | ICD-10-CM

## 2023-10-03 NOTE — Progress Notes (Signed)
 10/03/2023 Name: [name] MRN: [id number] DOB: 1960-09-28  Chief Complaint  Patient presents with   Medication Adherence    Hanan Moen is a 63 y.o. year old male who presented for a telephone visit. His Aunt Clarina assisted in providing the history.   They were referred to the pharmacist by their PCP for assistance in managing HTN, diabetes and complex medication management. [name] PMH includes HTN, PVD s/p BKA, HFrEF (30-35% in August 2023), T2DM, CAD s/p NSTEMI (2023), GERD, MCI, ESRD on dialysis, HLD, tobacco use disorder.   Subjective: Patient was seen by PCP, Lamonte Colace, DO, on 08/31/23. At last visit, BP was 184/95 mmHg, HR 80 bpm in the setting of not taking his BP medications that day. Throughout the visit patient reported difficulty remembering which medications he takes and no family was present. He states that he uses the same insulin  pen three times per day (7 units after meals and 17 units before bed). Due to difficult determining medication regimen, no changes were made. Per dialysis notes, patient misses sessions frequently, and often leaves early. Patient's sister Ronal, was engaged by pharmacy via telephone on 09/19/23. She reported patient was only taking Lantus  insulin  once daily in the evening, and she did not think he had a Humalog  pen at home. She reported he was wearing a Dexcom sensor, but did not believe it was connected to anything. Determined that patient would benefit from adherence packaging, and set appt to follow-up. At appt with Dr. Jolaine on 09/30/23, BP was 168/82 mmHg, HR 76 bpm. There continued to be confusion about his insulin  regimen. It was noted that his aunt, Stephens, also helps organize his medications. He was instructed to start amlodipine  5 mg daily for elevated BP. A1C had improved from 9.5% to 8.1%. LDL-C was at goal (24 mg/dL).   Today, I spoke with the patient's aunt Stephens, who was at home with the patient. She again confirmed that they had  ezetimibe , carvedilol , rosuvastatin , and pregabalin  - but patient had multiple bottles of each medication, so she suspected he is not taking them. He has not gotten amlodipine  or aspirin  yet. She also confirmed that he has insulin  glargine (Lantus ) and insulin  lispro (Humalog ). Reports she reminds him to administer Humalog  5 units with breakfast when she is there, but otherwise he takes insulin  whenever he feels like. She states he is not wearing a Dexcom sensor, and she cannot find the boxes of these at home. She agrees that putting his medications in pill packs may be helpful.  Care Team: Primary Care Provider: Letha Cheadle, DO ; Next Scheduled Visit: 10/28/23 Dialysis at [facility]  Medication Access/Adherence  Current Pharmacy:  [place] - [facility] [address], [address] [place] [address] Phone: [phone] Fax: [phone]  [place] - [facility] [address] [place] [address] Phone: [phone] Fax: [phone]  [place] - [id number] [place], [place] - [address] AT [facility] & [address] [place] [address] Phone: [phone] Fax: [phone]   Patient reports affordability concerns with their medications: No  Patient reports access/transportation concerns to their pharmacy: Yes  - would benefit from mail delivery, patient's sister does not have capacity to be full-time caretaker, but helps organize his medications as she is able to. His Aunt comes by in the morning, but she states she is not able to give him his medications.  Patient reports adherence concerns  with their medications:  Yes  - patient does not have a good understanding of his medication regimen.   Diabetes:  Current medications: Lantus  15 units at bedtime (patient reported taking 17 units at last PCP visit), Humalog  5 units TID with meals (His aunt reports that he usually gets 5 units with  breakfast, when she is there with him - patient also reported to PCP that he takes 11 units with dinner)  Current glucose readings: n/a - patient is not connected in [facility] but has current supply of Dexcom G7 sensors per fill hx. His aunt is unable to find these while we were on the phone today.  Current medication access support: [employer] Dual Complete  Hypertension:  Current medications: carvedilol  6.25 mg BID, amlodipine  5 mg daily (not dispensed yet) Previously tried: amlodipine  10 mg daily (last dispensed 07/20/21)  Patient has and, automated, upper arm home BP cuff - not checking at home because it is checked frequently at dialysis   Medication Management:  Current adherence strategy: Patient's sister fills up pill box for him  Patient reports Fair and Poor adherence to medications  Patient reports the following barriers to adherence: limited monitoring  Objective:  BP Readings from Last 3 Encounters:  09/30/23 (!) 168/82  08/31/23 (!) 184/95  08/04/23 (!) 158/77    Lab Results  Component Value Date   HGBA1C 8.1 (H) 09/30/2023   HGBA1C 9.5 (A) 06/22/2023   HGBA1C 9.3 (A) 12/22/2022       Latest Ref Rng & Units 06/02/2023    3:41 PM 03/26/2023    8:03 PM 03/18/2023    6:46 PM  BMP  Glucose 70 - 99 mg/dL 853  779  850   BUN 8 - 23 mg/dL 28  27  39   Creatinine 0.61 - 1.24 mg/dL 5.89  6.32  4.85   Sodium 135 - 145 mmol/L 134  135  138   Potassium 3.5 - 5.1 mmol/L 3.8  3.3  4.3   Chloride 98 - 111 mmol/L 99  101  109   CO2 22 - 32 mmol/L 27  24  19    Calcium  8.9 - 10.3 mg/dL 8.6  7.7  8.3     Lab Results  Component Value Date   CHOL 67 (L) 09/30/2023   HDL 31 (L) 09/30/2023   LDLCALC 24 09/30/2023   TRIG 40 09/30/2023   CHOLHDL 2.2 09/30/2023    Medications Reviewed Today     Reviewed by Brinda Lorain SQUIBB, RPH (Pharmacist) on 10/03/23 at 1149  Med List Status: <None>   Medication Order Taking? Sig Documenting Provider Last Dose Status Informant  Accu-Chek  Softclix Lancets lancets [id number]  Use to check blood sugar 3 (three) times daily. Gabino Boga, MD  Active   amLODipine  (NORVASC ) 5 MG tablet [id number]  Take 1 tablet (5 mg total) by mouth daily.  Patient not taking: Reported on 10/03/2023   Jolaine Pac, DO  Active   aspirin  EC 81 MG tablet [id number]  Take 1 tablet (81 mg total) by mouth daily. Swallow whole.  Patient not taking: Reported on 10/03/2023   Jolaine Pac, DO  Active   Blood Glucose Monitoring Suppl (ACCU-CHEK GUIDE) w/Device KIT [id number]  Use to check your blood sugar three times daily. Gabino Boga, MD  Active   carvedilol  (COREG ) 6.25 MG tablet [id number] Yes Take 1 tablet (6.25 mg total) by mouth 2 (two) times daily with meals for hypertension Jolaine Pac, DO  Active   Continuous Glucose Sensor (  DEXCOM G7 SENSOR) MISC [id number]  Place new sensor every 10 days. Use to monitor blood sugar continuously.  Patient not taking: Reported on 10/03/2023   Jolaine Pac, DO  Active   diclofenac  Sodium (VOLTAREN ) 1 % GEL [id number]  Apply 4 g topically 4 (four) times daily. Jolaine Pac, DO  Active   ezetimibe  (ZETIA ) 10 MG tablet [id number] Yes Take 1 tablet (10 mg total) by mouth daily. Gabino Boga, MD  Active   glucose blood (ACCU-CHEK AVIVA PLUS) test strip [id number]  Check blood sugar 3 times a day Gabino Boga, MD  Active   insulin  glargine (LANTUS  SOLOSTAR) 100 UNIT/ML Solostar Pen [id number] Yes Inject 15 Units into the skin at bedtime. Stephanie Freund, MD  Active   insulin  lispro (HUMALOG ) 100 UNIT/ML KwikPen [id number] Yes Inject 5 Units into the skin 3 (three) times daily with meals. Jolaine Pac, DO  Active   Insulin  Pen Needle 32G X 4 MM MISC [id number]  Use to inject insulin  4 (four) times daily. Gabino Boga, MD  Active   melatonin (KP MELATONIN) 3 MG TABS tablet [id number]  Take 1 tablet (3 mg total) by mouth at bedtime for insomnia. Jolaine Pac, DO  Active   ofloxacin  (OCUFLOX ) 0.3  % ophthalmic solution [id number]  Place 1 drop into the left eye 4 (four) times daily for 1 week, then stop.   Active   prednisoLONE  acetate (PRED FORTE ) 1 % ophthalmic suspension [id number]  Instill 1 drop into left eye 4 times a day for 1 week, then 2  times a day for 1 week, then once daily for 2 weeks then STOP   Active   pregabalin  (LYRICA ) 25 MG capsule [id number] Yes Take 1 capsule by mouth once a day Jolaine Pac, DO  Active   rosuvastatin  (CRESTOR ) 10 MG tablet [id number] Yes Take 1 tablet (10 mg total) by mouth daily. Jolaine Pac, DO  Active               Assessment/Plan:   Diabetes: - Currently uncontrolled with most recent A1C of 8.1% above goal <7%, but improved from 9.5%. Could consider A1C goal < 8% given co morbidities. Given discrepancies reported in insulin  dosing, believe patient would benefit from CGM monitoring. Patient has multiple caretakers, and it is unclear whether he has Dexcom sensors at home.  - Reviewed long term cardiovascular and renal outcomes of uncontrolled blood sugar - Reviewed goal A1c, goal fasting, and goal 2 hour post prandial glucose - Reviewed hypoglycemia management plan and the rule of 15 - Recommend to continue Lantus  17 units nightly.  - Recommend to continue Humalog  5 units TID with meals - Next A1C due 09/22/23     Hypertension: - Currently uncontrolled with clinic BP consistently above goal less than 130/80. PCP recently initiated amlodipine  5 mg daily, though patient has not picked this up yet. Will request fill from pharmacy - Recommend to continue carvedilol  6.25 mg BID  - Recommend to start amlodipine  5 mg daily as previously instructed   Medication Management: - Currently strategy insufficient to maintain appropriate adherence to prescribed medication regimen. Will contact [facility] to look into patient can initiate pill packs. Of note, pregabalin  would not be included in packs due to being a controlled substance - but  amlodipine , aspirin , carvedilol , ezetimibe , and rosuvastatin  could be included.  - Sent message to Ester Dickinson, CPhT, who manages pill packs - will follow-up after response    Confirmed upcoming PCP appt on 10/28/23. Instructed family member to accompany  patient to appt. His aunt Stephens wrote down the appt and said she would try to attend. Instructed her to bring all his medications.   Follow Up Plan:  Pharmacist telephone 2 weeks to coordinate pill packs PCP clinic visit 10/28/23   Lorain Baseman, PharmD [facility] [phone]

## 2023-10-03 NOTE — Progress Notes (Signed)
 Internal Medicine Clinic Attending  Case discussed with the resident at the time of the visit.  We reviewed the resident's history and exam and pertinent patient test results.  I agree with the assessment, diagnosis, and plan of care documented in the resident's note.

## 2023-10-04 ENCOUNTER — Other Ambulatory Visit (HOSPITAL_COMMUNITY): Payer: Self-pay

## 2023-10-04 ENCOUNTER — Other Ambulatory Visit: Payer: Self-pay

## 2023-10-04 ENCOUNTER — Other Ambulatory Visit: Payer: Self-pay | Admitting: Student

## 2023-10-04 DIAGNOSIS — I10 Essential (primary) hypertension: Secondary | ICD-10-CM

## 2023-10-04 MED ORDER — CARVEDILOL 6.25 MG PO TABS
6.2500 mg | ORAL_TABLET | Freq: Two times a day (BID) | ORAL | 3 refills | Status: DC
  Filled 2023-10-04 – 2023-10-27 (×2): qty 60, 30d supply, fill #0
  Filled 2023-11-21: qty 60, 30d supply, fill #1
  Filled 2023-12-15 – 2023-12-28 (×2): qty 60, 30d supply, fill #2
  Filled 2024-01-25: qty 60, 30d supply, fill #3

## 2023-10-04 NOTE — Telephone Encounter (Signed)
 Medication sent to pharmacy

## 2023-10-05 ENCOUNTER — Ambulatory Visit: Payer: Self-pay | Admitting: Student

## 2023-10-05 ENCOUNTER — Other Ambulatory Visit: Payer: Self-pay

## 2023-10-25 ENCOUNTER — Other Ambulatory Visit: Payer: Self-pay

## 2023-10-27 ENCOUNTER — Other Ambulatory Visit: Payer: Self-pay

## 2023-10-27 ENCOUNTER — Other Ambulatory Visit (HOSPITAL_COMMUNITY): Payer: Self-pay

## 2023-10-28 ENCOUNTER — Ambulatory Visit: Admitting: Student

## 2023-10-28 ENCOUNTER — Other Ambulatory Visit: Payer: Self-pay

## 2023-10-28 NOTE — Progress Notes (Deleted)
 CC: ***  HPI: [name] is a 63 y.o. male living with a history stated below and presents today for ***. Please see problem based assessment and plan for additional details.  Past Medical History:  Diagnosis Date   Atherosclerosis of native arteries of extremities with gangrene, left leg (HCC)    ESRD on hemodialysis (HCC)    Gangrene (HCC)    right foot   GERD (gastroesophageal reflux disease)    HFrEF (heart failure with reduced ejection fraction) (HCC)    Hyperlipidemia    Hypertension    Infection of amputation stump, left lower extremity (HCC) 08/10/2021   Neuromuscular disorder (HCC)    diabetic neruopathy - hands   Osteomyelitis (HCC) 2010   left foot, s/p midfoot amputation   Osteomyelitis (HCC) 09/2013   RT BKA   Osteomyelitis of ankle or foot 05/2011   rt foot, s/p 5th ray amputation   PAD (peripheral artery disease)    Pneumonia 2010   Retroperitoneal hematoma    05/08/2021 renal biopsy complicated by retroperitoneal hematoma. 5/9 - 05/20/2021 H/H 6.9/20.9.  Imaging revealed large retroperitoneal hematoma.  S/P 2 units packed cells H/H stabilized with final values of 11.3/36.4.   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 100 each 12   amLODipine  (NORVASC ) 5 MG tablet Take 1 tablet (5 mg total) by mouth daily. (Patient not taking: Reported on 10/03/2023) 30 tablet 1   aspirin  EC 81 MG tablet Take 1 tablet (81 mg total) by mouth daily. Swallow whole. (Patient not taking: Reported on 10/03/2023) 30 tablet 11   Blood Glucose Monitoring Suppl (ACCU-CHEK GUIDE)  w/Device KIT Use to check your blood sugar three times daily. 1 kit 3   carvedilol  (COREG ) 6.25 MG tablet Take 1 tablet (6.25 mg total) by mouth 2 (two) times daily with meals for hypertension 60 tablet 3   Continuous Glucose Sensor (DEXCOM G7 SENSOR) MISC Place new sensor every 10 days. Use to monitor blood sugar continuously. (Patient not taking: Reported on 10/03/2023) 9 each 3   diclofenac  Sodium (VOLTAREN ) 1 % GEL Apply 4 g topically 4 (four) times daily. 350 g 3   ezetimibe  (ZETIA ) 10 MG tablet Take 1 tablet (10 mg total) by mouth daily. 90 tablet 3   glucose blood (ACCU-CHEK AVIVA PLUS) test strip Check blood sugar 3 times a day 100 each 12   insulin  glargine (LANTUS  SOLOSTAR) 100 UNIT/ML Solostar Pen Inject 15 Units into the skin at bedtime. 15 mL 1   insulin  lispro (HUMALOG ) 100 UNIT/ML KwikPen Inject 5 Units into the skin 3 (three) times daily with meals. (Patient taking differently: Inject 5 Units into the skin 3 (three) times daily with meals.) 15 mL 3   Insulin  Pen Needle 32G X 4 MM MISC Use to inject insulin  4 (four) times daily. 360 each 3   melatonin (KP MELATONIN) 3 MG TABS tablet Take 1 tablet (3 mg total) by mouth at bedtime for insomnia. 60 tablet 0   ofloxacin  (OCUFLOX ) 0.3 % ophthalmic solution Place 1 drop into the left eye 4 (four) times  daily for 1 week, then stop. 5 mL 1   prednisoLONE  acetate (PRED FORTE ) 1 % ophthalmic suspension Instill 1 drop into left eye 4 times a day for 1 week, then 2  times a day for 1 week, then once daily for 2 weeks then STOP 5 mL 5   pregabalin  (LYRICA ) 25 MG capsule Take 1 capsule by mouth once a day 30 capsule 0   rosuvastatin  (CRESTOR ) 10 MG tablet Take 1 tablet (10 mg total) by mouth daily. 90 tablet 2   No current facility-administered medications on file prior to visit.    Family History  Problem Relation Age of Onset   Diabetes Mother    Hypertension Brother    Hypertension Sister    Anesthesia problems Neg Hx    Colon cancer Neg  Hx    Rectal cancer Neg Hx    Stomach cancer Neg Hx     Social History   Socioeconomic History   Marital status: Single    Spouse name: Not on file   Number of children: 3   Years of education: 11th   Highest education level: Not on file  Occupational History    Employer: [employer]  Tobacco Use   Smoking status: Every Day    Current packs/day: 1.50    Average packs/day: 1.5 packs/day for 1.8 years (2.7 ttl pk-yrs)    Types: Cigarettes    Start date: 2024    Passive exposure: Never   Smokeless tobacco: Never   Tobacco comments:    STARTED BACK SMOKING 2017. 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, makes chair parts)   Graduated from [employer]; No further school because he had a baby girl   He has 4 children  (17, 41 , 19, 30 as of 2013)   Social Drivers of Corporate investment banker Strain: Low Risk  (04/27/2023)   Overall Financial Resource Strain (CARDIA)    Difficulty of Paying Living Expenses: Not very hard  Food Insecurity: No Food Insecurity (04/27/2023)   Hunger Vital Sign    Worried About Running Out of Food in the Last Year: Never true    Ran Out of Food in the Last Year: Never true  Transportation Needs: No Transportation Needs (04/27/2023)   PRAPARE - Administrator, Civil Service (Medical): No    Lack of Transportation (Non-Medical): No  Physical Activity: Inactive (04/27/2023)   Exercise Vital Sign    Days of Exercise per Week: 0 days    Minutes of Exercise per Session: 0 min  Stress: Stress Concern Present (04/27/2023)   [name] of Occupational Health -  Occupational Stress Questionnaire    Feeling of Stress : To some extent  Social Connections: Patient Declined (04/27/2023)   Social Connection and Isolation Panel    Frequency of Communication with Friends and Family: Patient declined    Frequency of Social Gatherings with Friends and Family: Patient declined    Attends Religious Services: Patient declined    Database administrator or Organizations: Patient declined  Attends Banker Meetings: Patient declined    Marital Status: Patient declined  Intimate Partner Violence: Not At Risk (04/27/2023)   Humiliation, Afraid, Rape, and Kick questionnaire    Fear of Current or Ex-Partner: No    Emotionally Abused: No    Physically Abused: No    Sexually Abused: No    Review of Systems: ROS negative except for what is noted on the assessment and plan.  There were no vitals filed for this visit.  Physical Exam  Physical Exam: Constitutional: well-appearing *** sitting in ***, in no acute distress HENT: normocephalic atraumatic, mucous membranes moist Eyes: conjunctiva non-erythematous Cardiovascular: regular rate and rhythm, no m/r/g Pulmonary/Chest: normal work of breathing on room air, lungs clear to auscultation bilaterally Abdominal: soft, non-tender, non-distended MSK: *** Neurological: alert & oriented x 3, 5/5 strength in bilateral upper and lower extremities, normal gait Skin: warm and dry Psych: ***  Assessment & Plan:   Assessment & Plan     No orders of the defined types were placed in this encounter.  PMH: T2DM, NSTEMI, HFrEF, HTN, GERD, pulmonary nodules, mild cognitive impairment, ESRD on HD, chronic anemia, tobacco use, right BKA, left AKA  Status: {statusupdate:33856}.Last A1c 8.1 in 9/25.  A1c today is not due.  Currently taking ***, ***, ***.  Monitor: ***. Patient *** hypoglycemia.  Per review of ***, average blood glucose is***.  Average fasting blood glucose is***.  ***Unclear of which  medications he is taking he is prescribed Lantus  15 units nightly and Humalog  5 units 3 times daily AC  Plan -Continue *** -A1c in 2 months -Ophthalmology exam: *** -LDL 24 on 9/25, taking rosuvastatin  10 mg and Zetia  10 mg   Status: {statusupdate:33856}. BP today ***. Reports started amlodipine  5 mg and carvedilol  6.25 mg twice daily***.  ESRD on HD  Plan -Continue: *** -BMP ***  Care: Colonoscopy, Tdap, PCV, ophthalmology  VBCI/Layna  They have carvedilol , ezetimibe , pregabalin , rosuvastatin , and Lantus . She reports that she believes he is only taking Lantus  once nightly. She reports that she does not believe he has a Humalog  pen at home and does not think he is taking insulin  with meals, despite what he reported to PCP at last visit.. She reports he is wearing a Dexcom sensor but she does not believe it is connected to anything and he would benefit from assistance setting this up at follow-up.She reports that he is not taking aspirin  81 mg.   I spoke with the patient's aunt Clarina, who was at home with the patient. She again confirmed that they had ezetimibe , carvedilol , rosuvastatin , and pregabalin  - but patient had multiple bottles of each medication, so she suspected he is not taking them. He has not gotten amlodipine  or aspirin  yet. She also confirmed that he has insulin  glargine (Lantus ) and insulin  lispro (Humalog ). Reports she reminds him to administer Humalog  5 units with breakfast when she is there, but otherwise he takes insulin  whenever he feels like. She states he is not wearing a Dexcom sensor, and she cannot find the boxes of these at home. She agrees that putting his medications in pill packs may be helpful.     No follow-ups on file.   Patient {GC/GE:[id number]::discussed with,seen with} Dr. {WJFZD:[id number]::Tpoopjfd,Z. Hoffman,Winfrey,Narendra,Chun,Chambliss,Lau,Machen}  Ozell Nearing, D.O. [facility], PGY-3 Clinic Phone:  [phone] Date 10/28/2023 Time 8:00 AM

## 2023-10-31 ENCOUNTER — Other Ambulatory Visit: Payer: Self-pay

## 2023-11-01 ENCOUNTER — Other Ambulatory Visit: Payer: Self-pay

## 2023-11-02 ENCOUNTER — Other Ambulatory Visit: Payer: Self-pay

## 2023-11-03 ENCOUNTER — Telehealth: Payer: Self-pay | Admitting: *Deleted

## 2023-11-03 NOTE — Telephone Encounter (Signed)
 Copied from CRM [id number]. Topic: Clinical - Order For Equipment >> Nov 03, 2023  1:05 PM Carrielelia G wrote: Reason for CRM: Please call Ms Eluterio on the status of Pt [employer] .

## 2023-11-07 NOTE — Telephone Encounter (Signed)
 Copied from CRM [id number]. Topic: Clinical - Order For Equipment >> Nov 03, 2023  1:05 PM Carrielelia G wrote: Reason for CRM: Please call Ms Eluterio on the status of Pt Editor, [employer]ing . >> Nov 07, 2023  3:57 PM Zane F wrote: Caller: Ronal Eluterio ( patient's sister)  Calling to confirm the type of wheelchair the patient requires so other can assist in transporting the wheelchair and to assist the patient with limited hand usage; to go up and down ramps. Patient's sister is requesting a callback once the order has been placed.   Type: Light Weight electric wheelchair  Callback Number: [phone]  >> Nov 04, 2023  2:02 PM Miquel SAILOR wrote: PT sister Ronal calling on updated on  DME wheelchair for PT. No response from message yesterday. Called office but they are at lunch. Needs call back ASAP [phone]

## 2023-11-07 NOTE — Telephone Encounter (Signed)
 Copied from CRM [id number]. Topic: Clinical - Order For Equipment >> Nov 03, 2023  1:05 PM Carrielelia G wrote: Reason for CRM: Please call Ms Eluterio on the status of [name] . >> Nov 04, 2023  2:02 PM Miquel SAILOR wrote: PT sister Ronal calling on updated on  DME [name] for PT. No response from message yesterday. Called office but they are at lunch. Needs call back ASAP [phone]

## 2023-11-07 NOTE — Telephone Encounter (Signed)
 I talked to pt's sister, Ronal who stated [employer] needs to call her @ [phone] instead of the pt.

## 2023-11-09 ENCOUNTER — Other Ambulatory Visit (HOSPITAL_COMMUNITY): Payer: Self-pay

## 2023-11-09 ENCOUNTER — Other Ambulatory Visit: Payer: Self-pay

## 2023-11-09 ENCOUNTER — Ambulatory Visit (INDEPENDENT_AMBULATORY_CARE_PROVIDER_SITE_OTHER): Admitting: Student

## 2023-11-09 ENCOUNTER — Other Ambulatory Visit (HOSPITAL_BASED_OUTPATIENT_CLINIC_OR_DEPARTMENT_OTHER): Payer: Self-pay

## 2023-11-09 VITALS — BP 177/98 | HR 86 | Temp 97.9°F | Ht <= 58 in | Wt 162.0 lb

## 2023-11-09 DIAGNOSIS — F17210 Nicotine dependence, cigarettes, uncomplicated: Secondary | ICD-10-CM

## 2023-11-09 DIAGNOSIS — Z7409 Other reduced mobility: Secondary | ICD-10-CM

## 2023-11-09 DIAGNOSIS — Z7189 Other specified counseling: Secondary | ICD-10-CM | POA: Insufficient documentation

## 2023-11-09 DIAGNOSIS — E782 Mixed hyperlipidemia: Secondary | ICD-10-CM

## 2023-11-09 DIAGNOSIS — I120 Hypertensive chronic kidney disease with stage 5 chronic kidney disease or end stage renal disease: Secondary | ICD-10-CM

## 2023-11-09 DIAGNOSIS — E1122 Type 2 diabetes mellitus with diabetic chronic kidney disease: Secondary | ICD-10-CM

## 2023-11-09 DIAGNOSIS — E1151 Type 2 diabetes mellitus with diabetic peripheral angiopathy without gangrene: Secondary | ICD-10-CM

## 2023-11-09 DIAGNOSIS — E1142 Type 2 diabetes mellitus with diabetic polyneuropathy: Secondary | ICD-10-CM

## 2023-11-09 DIAGNOSIS — Z89612 Acquired absence of left leg above knee: Secondary | ICD-10-CM | POA: Diagnosis not present

## 2023-11-09 DIAGNOSIS — R5381 Other malaise: Secondary | ICD-10-CM

## 2023-11-09 DIAGNOSIS — G629 Polyneuropathy, unspecified: Secondary | ICD-10-CM

## 2023-11-09 DIAGNOSIS — Z833 Family history of diabetes mellitus: Secondary | ICD-10-CM

## 2023-11-09 DIAGNOSIS — I10 Essential (primary) hypertension: Secondary | ICD-10-CM

## 2023-11-09 DIAGNOSIS — Z89511 Acquired absence of right leg below knee: Secondary | ICD-10-CM | POA: Diagnosis not present

## 2023-11-09 DIAGNOSIS — N186 End stage renal disease: Secondary | ICD-10-CM

## 2023-11-09 DIAGNOSIS — Z992 Dependence on renal dialysis: Secondary | ICD-10-CM

## 2023-11-09 DIAGNOSIS — Z794 Long term (current) use of insulin: Secondary | ICD-10-CM

## 2023-11-09 DIAGNOSIS — F17200 Nicotine dependence, unspecified, uncomplicated: Secondary | ICD-10-CM

## 2023-11-09 DIAGNOSIS — Z8249 Family history of ischemic heart disease and other diseases of the circulatory system: Secondary | ICD-10-CM

## 2023-11-09 MED ORDER — DEXCOM G7 RECEIVER DEVI
0 refills | Status: DC
  Filled 2023-11-09: qty 1, 30d supply, fill #0
  Filled 2023-11-09: qty 1, 1d supply, fill #0

## 2023-11-09 MED ORDER — EZETIMIBE 10 MG PO TABS
10.0000 mg | ORAL_TABLET | Freq: Every day | ORAL | 3 refills | Status: AC
  Filled 2023-11-09: qty 90, 90d supply, fill #0
  Filled 2023-11-21: qty 30, 30d supply, fill #0
  Filled 2023-12-15 – 2023-12-28 (×2): qty 30, 30d supply, fill #1
  Filled 2024-01-25: qty 30, 30d supply, fill #2
  Filled 2024-02-03: qty 30, 30d supply, fill #3

## 2023-11-09 MED ORDER — PREGABALIN 25 MG PO CAPS
25.0000 mg | ORAL_CAPSULE | Freq: Every day | ORAL | 5 refills | Status: DC
  Filled 2023-11-09: qty 30, 30d supply, fill #0

## 2023-11-09 MED ORDER — AMLODIPINE BESYLATE 5 MG PO TABS
5.0000 mg | ORAL_TABLET | Freq: Every day | ORAL | 3 refills | Status: AC
  Filled 2023-11-09: qty 90, 90d supply, fill #0
  Filled 2023-11-21: qty 30, 30d supply, fill #0
  Filled 2023-12-15 – 2023-12-28 (×2): qty 30, 30d supply, fill #1
  Filled 2024-01-25: qty 30, 30d supply, fill #2
  Filled 2024-02-03: qty 30, 30d supply, fill #3

## 2023-11-09 MED ORDER — DICLOFENAC SODIUM 1 % EX GEL
4.0000 g | Freq: Four times a day (QID) | CUTANEOUS | 3 refills | Status: AC
  Filled 2023-11-09: qty 400, 50d supply, fill #0
  Filled 2023-12-15 – 2024-02-03 (×2): qty 400, 30d supply, fill #0

## 2023-11-09 MED ORDER — DEXCOM G7 SENSOR MISC
3 refills | Status: AC
  Filled 2023-11-09 (×2): qty 9, 90d supply, fill #0
  Filled 2023-12-15 – 2024-02-03 (×2): qty 9, 90d supply, fill #1

## 2023-11-09 NOTE — Assessment & Plan Note (Deleted)
  Orders:   Ambulatory Referral to Neuro Rehab

## 2023-11-09 NOTE — Patient Instructions (Addendum)
 Thank you, Mr.Armend Skoda for allowing us  to provide your care today. Today we discussed:  -STOP Humalog  insulin  (meal time/short acting insulin ) -Dexcom sensor and receiver to track blood sugar 24/7 -Come back to office in 1 month to review blood sugars -Bring ALL your medications at next office visit! -Make sure to continue going to dialysis  -Referral placed for electric wheelchair. Neurorehab will call to schedule appointment with you.   -Advance directive provided to you. Please speak with your sister and aunt regarding this.    Referrals ordered today:  Referral Orders         Ambulatory Referral to Neuro Rehab      I have ordered the following medication/changed the following medications:  Stop the following medications: Medications Discontinued During This Encounter  Medication Reason   insulin  lispro (HUMALOG ) 100 UNIT/ML KwikPen Discontinued by provider    Start the following medications: Meds ordered this encounter  Medications   pregabalin  (LYRICA ) 25 MG capsule    Sig: Take 1 capsule by mouth once a day    Dispense:  30 capsule    Refill:  5    Please ship to patient. Thank you   amLODipine  (NORVASC ) 5 MG tablet    Sig: Take 1 tablet (5 mg total) by mouth daily.    Dispense:  90 tablet    Refill:  3    Mail order   diclofenac  Sodium (VOLTAREN ) 1 % GEL    Sig: Apply 4 g topically 4 (four) times daily.    Dispense:  350 g    Refill:  3    Mail order   ezetimibe  (ZETIA ) 10 MG tablet    Sig: Take 1 tablet (10 mg total) by mouth daily.    Dispense:  90 tablet    Refill:  3    Please ship to patient. Thank you   Continuous Glucose Sensor (DEXCOM G7 SENSOR) MISC    Sig: Place new sensor every 10 days. Use to monitor blood sugar continuously.    Dispense:  9 each    Refill:  3    The patient is insulin  requiring, ICD 10 code E11.65.   Continuous Glucose Receiver (DEXCOM G7 RECEIVER) DEVI    Sig: Use to measure blood sugar continuously.    Dispense:  1 each     Refill:  0     Follow up: 1 month   Should you have any questions or concerns please call the internal medicine clinic at [phone].    Makailey Hodgkin, D.O. [facility]

## 2023-11-09 NOTE — Assessment & Plan Note (Signed)
 Refilled Zetia .   Orders:   ezetimibe  (ZETIA ) 10 MG tablet; Take 1 tablet (10 mg total) by mouth daily.

## 2023-11-09 NOTE — Assessment & Plan Note (Addendum)
 Status: Uncontrolled. Last A1c 8.1 in 09/2023. Prescribed Lantus  15 units at bedtime and Humalog  5 units TID AC. However, LOV and this OV patient unable to recall which insulin  he takes. Only states 17 at night and 11 around 10AM. When I asked him again he changed night amount to 15 or 11. States only takes 5 of 7 days. His PCS caregiver (aunt) questions if there is adherence at all. She is there for 3 hrs in AM almost every day. Pt's aunt present today but did not bring his medications nor knows which insulin  he uses. Does NOT check glucose at all.   Plan -STOP Humalog  -Continue Lantus  15 units daily -Refilled Dexcom sensor and receiver  -F/u w/ [facility] and Dr. Brinda  -Advised patient AND pt's aunt to bring ALL medications for med rec  Orders:   Continuous Glucose Sensor (DEXCOM G7 SENSOR) MISC; Place new sensor every 10 days. Use to monitor blood sugar continuously.   Continuous Glucose Receiver (DEXCOM G7 RECEIVER) DEVI; Use to measure blood sugar continuously.

## 2023-11-09 NOTE — Assessment & Plan Note (Addendum)
 Status: Uncontrolled. BP today 177/98. Prescribed amlodipine  5 mg and coreg  6.25 mg BID. Unclear of adherence, patient unable to recall medications.  ESRD on HD but inconsistent w/ HD due to pain/energy.   Plan -Continue: amlodipine  and coreg  -Continue HD and f/u w/ dialysis physician   Orders:   amLODipine  (NORVASC ) 5 MG tablet; Take 1 tablet (5 mg total) by mouth daily.

## 2023-11-09 NOTE — Assessment & Plan Note (Signed)
 Hx of L AKA and R BKA in setting of PAD and uncontrolled T2DM. Has manual wheelchair but has had more difficulty given his peripheral neuropathy. Discussion for motorized wheelchair in past but patient declined then but he is agreeable to it today. Will send referral for eval. I believe he would benefit w/ motorized wheelchair to improve his QOL and mobility.   Orders:   Ambulatory Referral to Neuro Rehab

## 2023-11-09 NOTE — Assessment & Plan Note (Signed)
 TTS but reports last session Saturday. Aunt takes him to bus stop and picks him up from stop after HD. Unclear adherence and he states misses HD due to neuropathy/how he feels that day.

## 2023-11-09 NOTE — Assessment & Plan Note (Signed)
 Still smokes ~1ppd. Declines wanting to quit at this time. Declines resources such as NRT, medications or hotline info. Continue counseling at future visits.

## 2023-11-09 NOTE — Assessment & Plan Note (Addendum)
 Reports running out of Lyrica , refilled today.   Orders:   pregabalin  (LYRICA ) 25 MG capsule; Take 1 capsule by mouth once a day

## 2023-11-09 NOTE — Assessment & Plan Note (Signed)
 Voluntary and patient centered. Patient names his sister [name]) as his clinical research associate. Discussed his chronic conditions and how it is impacting his QOL. His mood is depressed due to this (diabetes, amputations, HD, neuropathy). Denies SI/HI and declined counseling/therapy/resources/medications. Expressed my concern regarding his QOL and prognosis. Hx of MCI but unable to see for geriatrics assessment. Did not discuss but can consider neurology referral at next OV. Has PCS and aunt provides care (8a-11am x 7 days).   Outcome: Provided advance directive paperwork to review w/ sister and aunt.   Time spent for advance care planning: 20 minutes.

## 2023-11-09 NOTE — Assessment & Plan Note (Deleted)
  Orders:   ezetimibe  (ZETIA ) 10 MG tablet; Take 1 tablet (10 mg total) by mouth daily.

## 2023-11-09 NOTE — Progress Notes (Unsigned)
 CC: Request for DME-motorized wheelchair  HPI: [name] is a 63 y.o. male living with a history stated below and presents today for DME request. Please see problem based assessment and plan for additional details.  Past Medical History:  Diagnosis Date   Atherosclerosis of native arteries of extremities with gangrene, left leg (HCC)    ESRD on hemodialysis (HCC)    Gangrene (HCC)    right foot   GERD (gastroesophageal reflux disease)    HFrEF (heart failure with reduced ejection fraction) (HCC)    Hyperlipidemia    Hypertension    Infection of amputation stump, left lower extremity (HCC) 08/10/2021   Neuromuscular disorder (HCC)    diabetic neruopathy - hands   Osteomyelitis (HCC) 2010   left foot, s/p midfoot amputation   Osteomyelitis (HCC) 09/2013   RT BKA   Osteomyelitis of ankle or foot 05/2011   rt foot, s/p 5th ray amputation   PAD (peripheral artery disease)    Pneumonia 2010   Retroperitoneal hematoma    05/08/2021 renal biopsy complicated by retroperitoneal hematoma. 5/9 - 05/20/2021 H/H 6.9/20.9.  Imaging revealed large retroperitoneal hematoma.  S/P 2 units packed cells H/H stabilized with final values of 11.3/36.4.   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 100 each 12   aspirin  EC 81 MG tablet Take 1 tablet (81 mg total) by mouth daily. Swallow whole. (Patient not taking: Reported on 10/03/2023) 30 tablet 11   Blood Glucose Monitoring Suppl (ACCU-CHEK GUIDE) w/Device KIT Use to check your blood sugar three times daily. 1 kit 3   carvedilol  (COREG ) 6.25 MG  tablet Take 1 tablet (6.25 mg total) by mouth 2 (two) times daily with meals for hypertension 60 tablet 3   glucose blood (ACCU-CHEK AVIVA PLUS) test strip Check blood sugar 3 times a day 100 each 12   insulin  glargine (LANTUS  SOLOSTAR) 100 UNIT/ML Solostar Pen Inject 15 Units into the skin at bedtime. 15 mL 1   Insulin  Pen Needle 32G X 4 MM MISC Use to inject insulin  4 (four) times daily. 360 each 3   melatonin (KP MELATONIN) 3 MG TABS tablet Take 1 tablet (3 mg total) by mouth at bedtime for insomnia. 60 tablet 0   ofloxacin  (OCUFLOX ) 0.3 % ophthalmic solution Place 1 drop into the left eye 4 (four) times daily for 1 week, then stop. 5 mL 1   prednisoLONE  acetate (PRED FORTE ) 1 % ophthalmic suspension Instill 1 drop into left eye 4 times a day for 1 week, then 2  times a day for 1 week, then once daily for 2 weeks then STOP 5 mL 5   rosuvastatin  (CRESTOR ) 10 MG tablet Take 1 tablet (10 mg total) by mouth daily. 90 tablet 2   No current facility-administered medications on file prior to visit.    Family History  Problem Relation Age of Onset   Diabetes Mother    Hypertension Brother    Hypertension Sister    Anesthesia problems Neg Hx    Colon cancer Neg Hx    Rectal cancer Neg  Hx    Stomach cancer Neg Hx     Social History   Socioeconomic History   Marital status: Single    Spouse name: Not on file   Number of children: 3   Years of education: 11th   Highest education level: Not on file  Occupational History    Employer: [employer]  Tobacco Use   Smoking status: Every Day    Current packs/day: 1.50    Average packs/day: 1.5 packs/day for 1.8 years (2.8 ttl pk-yrs)    Types: Cigarettes    Start date: 2024    Passive exposure: Never   Smokeless tobacco: Never   Tobacco comments:    STARTED BACK SMOKING 2017. 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, makes chair parts)   Graduated from [employer]; No further school because he had a baby girl   He has 4 children  (17, 14 , 4, 30 as of 2013)   Social Drivers of Corporate Investment Banker Strain: Low Risk  (04/27/2023)   Overall Financial Resource Strain (CARDIA)    Difficulty of Paying Living Expenses: Not very hard  Food Insecurity: No Food Insecurity (04/27/2023)   Hunger Vital Sign    Worried About Running Out of Food in the Last Year: Never true    Ran Out of Food in the Last Year: Never true  Transportation Needs: No Transportation Needs (04/27/2023)   PRAPARE - Administrator, Civil Service (Medical): No    Lack of Transportation (Non-Medical): No  Physical Activity: Inactive (04/27/2023)   Exercise Vital Sign    Days of Exercise per Week: 0 days    Minutes of Exercise per Session: 0 min  Stress: Stress Concern Present (04/27/2023)   [employer] of Occupational Health - Occupational Stress Questionnaire    Feeling of Stress : To some extent  Social Connections: Patient Declined (04/27/2023)   Social Connection and Isolation Panel    Frequency of Communication with Friends and Family: Patient declined    Frequency of Social Gatherings with Friends and Family: Patient declined    Attends Religious Services: Patient declined    Database Administrator or Organizations: Patient declined    Attends Banker Meetings: Patient declined    Marital Status: Patient declined  Intimate Partner Violence: Not At Risk (04/27/2023)   Humiliation, Afraid, Rape, and Kick questionnaire    Fear of Current or Ex-Partner: No    Emotionally Abused: No    Physically Abused: No    Sexually Abused: No    Review of  Systems: ROS negative except for what is noted on the assessment and plan.  Vitals:   11/09/23 1031  BP: (!) 177/98  Pulse: 86  Temp: 97.9 F (36.6 C)  TempSrc: Oral  SpO2: 100%  Weight: 162 lb (73.5 kg)  Height: 4' 2 (1.27 m)   Physical Exam: Constitutional: alert, chronically ill-appearing male sitting in wheelchair, in no acute distress Cardiovascular: regular rate  Pulmonary/Chest: normal work of breathing on room air MSK: L AKA,  R BKA w/ prosthesis  Neurological: alert & oriented x 3 Skin: warm and dry, R TDC Psych: depressed mood, denies SI/HI  Assessment & Plan:   Assessment & Plan Disability affecting daily living S/P AKA (above knee amputation) unilateral, 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 Has manual wheelchair but has had more difficulty given his peripheral neuropathy. Discussion for motorized wheelchair in past but patient declined then but he is agreeable to it today. Will send referral for eval. I believe he would benefit w/ motorized wheelchair to improve his QOL and mobility.   Orders:   Ambulatory Referral to Neuro Rehab  Neuropathy Reports running out of Lyrica  for his peripheral neuropathy, refilled today.   Orders:   pregabalin  (LYRICA ) 25 MG capsule; Take 1 capsule by mouth once a day  Hypertension, essential Status: Uncontrolled. BP today 177/98. Denies acute symptoms at this time. Prescribed amlodipine  5 mg and coreg  6.25 mg BID. Unclear of adherence, patient unable to recall medications.  ESRD on HD but inconsistent w/ HD due to pain/energy.   Plan -Continue: amlodipine  and coreg  -Continue HD and f/u w/ dialysis physician   Orders:   amLODipine  (NORVASC ) 5 MG tablet; Take 1 tablet (5 mg total) by mouth daily.  Type 2 diabetes mellitus with diabetic peripheral angiopathy without gangrene, with long-term current use of insulin  (HCC) Status:  Uncontrolled. Last A1c 8.1 in 09/2023. Prescribed Lantus  15 units at bedtime and Humalog  5 units TID AC. However, LOV and this OV patient unable to recall which insulin  he takes. Only states 17 at night and 11 around 10AM. When I asked him again he changed night amount to 15 or 11. States only takes 5 of 7 days. His PCS caregiver (aunt) questions if there is adherence at all. She is there for 3 hrs in AM almost every day. Pt's aunt present today but did not bring his medications nor knows which insulin  he uses. Does NOT check glucose at all.   Plan -STOP Humalog  -Continue Lantus  15 units daily -Refilled Dexcom sensor and receiver  -F/u w/ [facility] and Dr. Brinda  -Advised patient AND pt's aunt to bring ALL medications for med rec  Orders:   Continuous Glucose Sensor (DEXCOM G7 SENSOR) MISC; Place new sensor every 10 days. Use to monitor blood sugar continuously.   Continuous Glucose Receiver (DEXCOM G7 RECEIVER) DEVI; Use to measure blood sugar continuously.  Mixed hyperlipidemia Refilled Zetia .   Orders:   ezetimibe  (ZETIA ) 10 MG tablet; Take 1 tablet (10 mg total) by mouth daily.  ESRD on dialysis [facility]) TTS but reports last session Saturday. Aunt takes him to bus stop and picks him up from stop after HD. Unclear adherence and he states misses HD due to neuropathy/how he feels that day.     Tobacco use disorder Still smokes ~1ppd. Declines wanting to quit at this time. Declines resources such as NRT, medications or hotline info. Continue counseling at future visits.     Advance care planning Voluntary and patient centered. Patient names his sister [name]) as his clinical research associate. Discussed his chronic conditions and how it is impacting his QOL. His mood is depressed due to this (diabetes, amputations, HD, neuropathy). Denies SI/HI and declined counseling/therapy/resources/medications. Expressed my concern regarding his QOL and prognosis. Hx of MCI but unable to see for  geriatrics assessment. Did not discuss but can consider neurology referral at next OV. Has  PCS and aunt provides care (8a-11am x 7 days).   Outcome: Provided advance directive paperwork to review w/ sister and aunt.   Time spent for advance care planning: 20 minutes.        Return in about 4 weeks (around 12/07/2023) for diabetes, med rec (bring all your medications).   Patient discussed with Dr. Karna Ozell Nearing, D.O. [facility], PGY-3 Clinic Phone: [phone] Date 11/09/2023 Time 8:53 PM

## 2023-11-10 ENCOUNTER — Encounter: Payer: Self-pay | Admitting: Student

## 2023-11-10 ENCOUNTER — Other Ambulatory Visit: Payer: Self-pay

## 2023-11-10 ENCOUNTER — Encounter: Payer: Self-pay | Admitting: Pharmacist

## 2023-11-10 NOTE — Progress Notes (Signed)
 Internal Medicine Clinic Attending  Case discussed with the resident at the time of the visit.  We reviewed the resident's history and exam and pertinent patient test results.  I agree with the assessment, diagnosis, and plan of care documented in the resident's note.

## 2023-11-14 NOTE — Telephone Encounter (Deleted)
 Called back and no answer in regards to a letter being written.  Also a Neuro-rehab Order was placed for and sent below for an  Evaluation for a PWC   Type Date User Summary Attachment  General 11/10/2023 11:18 AM Shirlean Brunetta [facility] -  Note: [facility] in [place], [place]  Address: [address], [place], [place] [address]   Phone: [phone] . Type Date User Summary Attachment  Provider Comments 11/09/2023 11:22 AM Elicia Sharper, DO Provider Comments -  Note: Please call sister Alston Sherry) or aunt Laymon Pouch). Request for motorized wheelchair. Hx of L AKA and R BKA.   Vida Shaver, FNP  [address]  [place], [place] [address]  [phone] (Work)  [phone] (Fax)     Copied from CRM [id number]. Topic: Clinical - Order For Equipment >> Nov 10, 2023  1:17 PM Susanna ORN wrote: Reason for CRM: Chrissie Moselle, called stating that patient is needing a letter from the doctor in order for him to get a ramp built per his landlord. Delores states she's already spoken with the landlord and they are requesting it. States he's also suppose to have a request for a wheelchair as well. Please give her a call back on this matter. CB #: [phone].

## 2023-11-15 ENCOUNTER — Other Ambulatory Visit: Payer: Self-pay

## 2023-11-21 ENCOUNTER — Other Ambulatory Visit: Payer: Self-pay

## 2023-11-22 ENCOUNTER — Other Ambulatory Visit: Payer: Self-pay

## 2023-11-23 ENCOUNTER — Other Ambulatory Visit: Payer: Self-pay

## 2023-11-24 ENCOUNTER — Other Ambulatory Visit: Payer: Self-pay

## 2023-12-02 ENCOUNTER — Other Ambulatory Visit: Payer: Self-pay

## 2023-12-05 ENCOUNTER — Telehealth: Payer: Self-pay | Admitting: *Deleted

## 2023-12-05 NOTE — Telephone Encounter (Signed)
 Will forward to C. Boone.                  Copied from CRM #[id number]. Topic: Clinical - Order For Equipment >> Dec 05, 2023 10:25 AM Farrel B wrote: Reason for CRM: [name] (EC) [phone] (Mobile)sister of the pt has called in stating that there's been an ongoing situation with trying to get the pt a wheelchair for the past few months she states that she needs someone to speak with her asap in order to provide her with an update on the DME and to see if it was actually ordered. I advised Ms. [name] that the equipment was ordered on 09/18/23 she has requested that someone on the clinic chk on status and contact her with an update.

## 2023-12-07 ENCOUNTER — Ambulatory Visit: Admitting: Student

## 2023-12-14 ENCOUNTER — Emergency Department (HOSPITAL_COMMUNITY)
Admission: EM | Admit: 2023-12-14 | Discharge: 2023-12-15 | Disposition: A | Discharge status: Home / Self Care | Attending: Emergency Medicine | Admitting: Emergency Medicine

## 2023-12-14 ENCOUNTER — Emergency Department (HOSPITAL_COMMUNITY)

## 2023-12-14 ENCOUNTER — Other Ambulatory Visit: Payer: Self-pay

## 2023-12-14 ENCOUNTER — Encounter (HOSPITAL_COMMUNITY): Payer: Self-pay

## 2023-12-14 DIAGNOSIS — Z7982 Long term (current) use of aspirin: Secondary | ICD-10-CM | POA: Diagnosis not present

## 2023-12-14 DIAGNOSIS — Z794 Long term (current) use of insulin: Secondary | ICD-10-CM | POA: Diagnosis not present

## 2023-12-14 DIAGNOSIS — N186 End stage renal disease: Secondary | ICD-10-CM | POA: Diagnosis not present

## 2023-12-14 DIAGNOSIS — I120 Hypertensive chronic kidney disease with stage 5 chronic kidney disease or end stage renal disease: Secondary | ICD-10-CM | POA: Insufficient documentation

## 2023-12-14 DIAGNOSIS — G546 Phantom limb syndrome with pain: Secondary | ICD-10-CM | POA: Insufficient documentation

## 2023-12-14 DIAGNOSIS — G629 Polyneuropathy, unspecified: Secondary | ICD-10-CM

## 2023-12-14 DIAGNOSIS — Z992 Dependence on renal dialysis: Secondary | ICD-10-CM | POA: Insufficient documentation

## 2023-12-14 DIAGNOSIS — Z79899 Other long term (current) drug therapy: Secondary | ICD-10-CM | POA: Diagnosis not present

## 2023-12-14 DIAGNOSIS — M79604 Pain in right leg: Secondary | ICD-10-CM

## 2023-12-14 MED ORDER — OXYCODONE HCL 5 MG PO TABS
5.0000 mg | ORAL_TABLET | Freq: Once | ORAL | Status: AC
  Administered 2023-12-14: 5 mg via ORAL
  Filled 2023-12-14: qty 1

## 2023-12-14 NOTE — ED Notes (Signed)
 Security wheeled patient up to registration because he was in lobby screaming. Patient yelling about how he is in pain and he shouldn't have to suffer in the hospital. Patient was educated that we have no orders to give pain medication as he was given pain medication at 5:17pm.

## 2023-12-14 NOTE — ED Triage Notes (Signed)
 C/O phatom pain on left stump since yesterday, left eye blurred vision for a month. Pt missed last 2 dialysis sessions. Axox4. Denies CP and SHOB.

## 2023-12-14 NOTE — ED Notes (Signed)
 Instead of patient asking for help to the restroom patient urinated in the floor in the lobby of the emergency room.

## 2023-12-14 NOTE — ED Provider Triage Note (Signed)
 Emergency Medicine Provider Triage Evaluation Note  [name] , a 63 y.o. male  was evaluated in triage.  Pt complains of status post bilateral BKA.  Presents with right lower extremity pain  Review of Systems  Positive:  Negative:   Physical Exam  BP (!) 178/95 (BP Location: Left Arm)   Pulse 88   Temp 98 F (36.7 C) (Oral)   Resp 18   Wt 79 kg   SpO2 100%   BMI 48.98 kg/m  Gen:   Awake, no distress, appears uncomfortable Resp:  Normal effort  MSK:   Moves extremities without difficulty  Other:  No overlying erythema, warmth or swelling  Medical Decision Making  Medically screening exam initiated at 5:05 PM.  Appropriate orders placed.  Calyx Hawker was informed that the remainder of the evaluation will be completed by another provider, this initial triage assessment does not replace that evaluation, and the importance of remaining in the ED until their evaluation is complete.     Donnajean Lynwood DEL, PA-C 12/14/23 1708

## 2023-12-14 NOTE — ED Notes (Signed)
 Patient seen leaving the ED lobby again.

## 2023-12-14 NOTE — ED Notes (Signed)
 Patient seen being wheeled out of emergency room lobby.

## 2023-12-15 ENCOUNTER — Other Ambulatory Visit: Payer: Self-pay

## 2023-12-15 ENCOUNTER — Other Ambulatory Visit: Payer: Self-pay | Admitting: Student

## 2023-12-15 ENCOUNTER — Other Ambulatory Visit (HOSPITAL_COMMUNITY): Payer: Self-pay

## 2023-12-15 ENCOUNTER — Telehealth: Payer: Self-pay | Admitting: *Deleted

## 2023-12-15 DIAGNOSIS — G546 Phantom limb syndrome with pain: Secondary | ICD-10-CM | POA: Diagnosis not present

## 2023-12-15 DIAGNOSIS — E1151 Type 2 diabetes mellitus with diabetic peripheral angiopathy without gangrene: Secondary | ICD-10-CM

## 2023-12-15 MED ORDER — PREGABALIN 25 MG PO CAPS
25.0000 mg | ORAL_CAPSULE | Freq: Two times a day (BID) | ORAL | 0 refills | Status: AC
  Filled 2023-12-15 (×2): qty 60, 30d supply, fill #0

## 2023-12-15 MED ORDER — OXYCODONE-ACETAMINOPHEN 5-325 MG PO TABS
1.0000 | ORAL_TABLET | ORAL | Status: DC | PRN
  Administered 2023-12-15: 1 via ORAL
  Filled 2023-12-15: qty 1

## 2023-12-15 MED ORDER — OXYCODONE HCL 5 MG PO TABS
5.0000 mg | ORAL_TABLET | Freq: Four times a day (QID) | ORAL | 0 refills | Status: AC | PRN
  Filled 2023-12-15 (×2): qty 12, 3d supply, fill #0

## 2023-12-15 MED ORDER — OXYCODONE HCL 5 MG PO TABS
5.0000 mg | ORAL_TABLET | ORAL | Status: AC
  Administered 2023-12-15: 5 mg via ORAL
  Filled 2023-12-15: qty 1

## 2023-12-15 NOTE — ED Notes (Signed)
 Pt had very large bowel movement in wheelchair. Pt was taken to bathroom and assisted in cleaning up and changing bottoms. Pt provided with warm blanket and wheeled back into lobby.

## 2023-12-15 NOTE — Telephone Encounter (Signed)
 Last rx was sent 11/09/23 I called the pharmacy and spoke to [place]. Per Dorise the patient requested the rx through the app. I called the patient to let him know that he does not need a rx refill as the receiver device is 1 every 365 days. I was unable to reach the patient. I lvm for him to give us  a call back.

## 2023-12-15 NOTE — ED Notes (Addendum)
 PTAR called

## 2023-12-15 NOTE — ED Provider Notes (Signed)
 [facility] AT [facility] Provider Note   CSN: [id number] Arrival date & time: 12/14/23  8365     Patient presents with: Leg Pain   [name] is a 63 y.o. male.   63 year old male with ESRD on dialysis, hypertension, and bilateral lower extremity amputation who presents to the emergency department with phantom pain.  For the past 2 days has been having pain where his leg used to be on the right side.  No fevers or chills.  No changes to his stumps that he has noticed.       Prior to Admission medications  Medication Sig Start Date End Date Taking? Authorizing Provider  oxyCODONE  (ROXICODONE ) 5 MG immediate release tablet Take 1 tablet (5 mg total) by mouth every 6 (six) hours as needed for severe pain (pain score 7-10). 12/15/23  Yes Yolande Lamar BROCKS, MD  pregabalin  (LYRICA ) 25 MG capsule Take 1 capsule (25 mg total) by mouth 2 (two) times daily. 12/15/23 01/14/24 Yes Yolande Lamar BROCKS, MD  Accu-Chek Softclix Lancets lancets Use to check blood sugar 3 (three) times daily. 12/22/22   Gabino Boga, MD  amLODipine  (NORVASC ) 5 MG tablet Take 1 tablet (5 mg total) by mouth daily. 11/09/23   Zheng, Michael, DO  aspirin  EC 81 MG tablet Take 1 tablet (81 mg total) by mouth daily. Swallow whole. Patient not taking: Reported on 10/03/2023 09/20/23   Jolaine Pac, DO  Blood Glucose Monitoring Suppl (ACCU-CHEK GUIDE) w/Device KIT Use to check your blood sugar three times daily. 12/23/22   Gabino Boga, MD  carvedilol  (COREG ) 6.25 MG tablet Take 1 tablet (6.25 mg total) by mouth 2 (two) times daily with meals for hypertension 10/04/23   Jolaine Pac, DO  Continuous Glucose Receiver (DEXCOM G7 RECEIVER) DEVI Use to measure blood sugar continuously. 11/09/23   Zheng, Michael, DO  Continuous Glucose Sensor (DEXCOM G7 SENSOR) MISC Place new sensor every 10 days. Use to monitor blood sugar continuously. 11/09/23   Zheng, Michael, DO  diclofenac  Sodium  (VOLTAREN ) 1 % GEL Apply 4 g topically 4 (four) times daily. 11/09/23   Zheng, Michael, DO  ezetimibe  (ZETIA ) 10 MG tablet Take 1 tablet (10 mg total) by mouth daily. 11/09/23   Zheng, Michael, DO  glucose blood (ACCU-CHEK AVIVA PLUS) test strip Check blood sugar 3 times a day 12/22/22   Gabino Boga, MD  insulin  glargine (LANTUS  SOLOSTAR) 100 UNIT/ML Solostar Pen Inject 15 Units into the skin at bedtime. 05/31/23   Stephanie Freund, MD  Insulin  Pen Needle 32G X 4 MM MISC Use to inject insulin  4 (four) times daily. 12/22/22   Gabino Boga, MD  melatonin (KP MELATONIN) 3 MG TABS tablet Take 1 tablet (3 mg total) by mouth at bedtime for insomnia. 09/26/23   Jolaine Pac, DO  ofloxacin  (OCUFLOX ) 0.3 % ophthalmic solution Place 1 drop into the left eye 4 (four) times daily for 1 week, then stop. 06/20/23     prednisoLONE  acetate (PRED FORTE ) 1 % ophthalmic suspension Instill 1 drop into left eye 4 times a day for 1 week, then 2  times a day for 1 week, then once daily for 2 weeks then STOP 06/20/23     rosuvastatin  (CRESTOR ) 10 MG tablet Take 1 tablet (10 mg total) by mouth daily. 09/26/23   Jolaine Pac, DO    Allergies: Benicar  [olmesartan ]    Review of Systems  Updated Vital Signs BP (!) 196/88 (BP Location: Left Arm)   Pulse 91  Temp 97.6 F (36.4 C)   Resp 18   Wt 79 kg   SpO2 100%   BMI 48.98 kg/m   Physical Exam Constitutional:      Appearance: Normal appearance.  Musculoskeletal:     Comments: Right lower extremity BKA stump.  Site appears clean dry and intact.  No significant skin breakdown.  No erythema or signs of infection.  No open wounds or drainage.  Neurological:     Mental Status: He is alert.     (all labs ordered are listed, but only abnormal results are displayed) Labs Reviewed - No data to display  EKG: None  Radiology: DG Knee 2 Views Right Result Date: 12/14/2023 EXAM: 2 VIEW(S) XRAY OF THE RIGHT KNEE 12/14/2023 05:52:00 PM COMPARISON: None  available. CLINICAL HISTORY: pain FINDINGS: BONES AND JOINTS: Right below-knee amputation. No cortical destruction to suggest osteomyelitis. SOFT TISSUES: Vascular calcifications. IMPRESSION: 1. Right below-knee amputation. No acute findings. Electronically signed by: Pinkie Pebbles MD 12/14/2023 05:59 PM EST RP Workstation: [id number]     Procedures   Medications Ordered in the ED  oxyCODONE  (Oxy IR/ROXICODONE ) immediate release tablet 5 mg (5 mg Oral Given 12/14/23 1717)  oxyCODONE  (Oxy IR/ROXICODONE ) immediate release tablet 5 mg (5 mg Oral Given 12/15/23 0726)                                    Medical Decision Making Risk Prescription drug management.   63 year old male history of ESRD on IHD, hypertension, and bilateral lower extremity amputations who presents emergency department phantom pain in his right leg  Initial Ddx:  Phantom pain, infection, fracture/injury  MDM/Course:  Patient presents to the emergency department with phantom pain.  Says its worsened over the past 2 days.  When I talk to him again he says he has had this sort of nerve pain before.  He is on pregabalin  for this but at a low dose I suspect due to his ESRD.  Does have some room to go up on this so we will uptitrate him to 25 mg twice daily.  He said that the oxycodone  he was given helped him so we will give him a short course of this since the pregabalin  will take some time to work.  Will have him follow-up with his primary doctor and surgeon regarding his pain in this location.   This patient presents to the ED for concern of complaints listed in HPI, this involves an extensive number of treatment options, and is a complaint that carries with it a high risk of complications and morbidity. Disposition including potential need for admission considered.   Dispo: DC Home. Return precautions discussed including, but not limited to, those listed in the AVS. Allowed pt time to ask questions which were answered  fully prior to dc.  Records reviewed Outpatient Clinic Notes I independently reviewed the following imaging with scope of interpretation limited to determining acute life threatening conditions related to emergency care: Extremity x-ray(s) and agree with the radiologist interpretation with the following exceptions: none I have reviewed the patients home medications and made adjustments as needed  Portions of this note were generated with Dragon dictation software. Dictation errors may occur despite best attempts at proofreading.     Final diagnoses:  Phantom pain after amputation of lower extremity (HCC)  Pain of right lower extremity    ED Discharge Orders  Ordered    pregabalin  (LYRICA ) 25 MG capsule  2 times daily        12/15/23 0729    oxyCODONE  (ROXICODONE ) 5 MG immediate release tablet  Every 6 hours PRN        12/15/23 0730               Yolande Lamar BROCKS, MD 12/15/23 [phone]

## 2023-12-15 NOTE — Discharge Instructions (Signed)
 You were seen for your phantom limb pain and nerve pain in the emergency department.   At home, please start taking the increased dose of pregabalin  25 mg twice a day.  It will take some time for this to become effective so in the meantime take the oxycodone  along with Tylenol  as needed for your pain.  Do not take the oxycodone  and pregabalin  at the same time since they can both make you drowsy.  Check your MyChart online for the results of any tests that had not resulted by the time you left the emergency department.   Follow-up with your primary doctor in 5-7 days regarding your visit.  Please talk to them to see if your pregabalin  dose needs to be increased.  Please also follow-up with your surgeons to soon as possible  Return immediately to the emergency department if you experience any of the following: Worsening pain, fevers, drainage from your stump, or any other concerning symptoms.    Thank you for visiting our Emergency Department. It was a pleasure taking care of you today.

## 2023-12-15 NOTE — Telephone Encounter (Signed)
 Copied from CRM #[id number]. Topic: Clinical - Medication Question >> Dec 15, 2023 12:52 PM DeAngela L wrote: Reason for CRM: Ms [name] B. Is calling to ask can the doctor send over information to CAP for the patient, the Cap office informed her that if the doctor sends information about the patients needs changing and being more urgent this can help the patient move up the list and help him get assistance quicker by sending all new Updated Medical information, cause 3 or 4 hours a day with care is not helping the patient since he can't do much for himself and his wheelchair can't fit in his bathroom cause the patient doesn't have a handicap apartment.  CAP fax num [phone]  Ms [name] [phone]

## 2023-12-19 NOTE — Telephone Encounter (Signed)
 Ms Chrissie stated pt has been on the CAP list for 1 year; he's # 54 on the list. The CAP person stated if his doctor could write a letter explaining pt's condition/needs; update his medial condition, he might be able to move up the list. Ms Chrissie stated pt has an aide for 4 hours daily but sometimes he's left alone n- needs more help. She stated pt is using diapers, had leg amputation, has vision problem d/t cataract, memory changes. Stated they are trying to avoid nursing home placement. Stated anything the doctor can do will help.

## 2023-12-20 ENCOUNTER — Other Ambulatory Visit: Payer: Self-pay

## 2023-12-21 ENCOUNTER — Other Ambulatory Visit: Payer: Self-pay

## 2023-12-28 ENCOUNTER — Other Ambulatory Visit: Payer: Self-pay

## 2023-12-28 ENCOUNTER — Encounter (HOSPITAL_COMMUNITY): Payer: Self-pay

## 2023-12-28 ENCOUNTER — Emergency Department (HOSPITAL_COMMUNITY)
Admission: EM | Admit: 2023-12-28 | Discharge: 2023-12-29 | Disposition: A | Discharge status: Home / Self Care | Attending: Emergency Medicine | Admitting: Emergency Medicine

## 2023-12-28 DIAGNOSIS — M79605 Pain in left leg: Secondary | ICD-10-CM | POA: Insufficient documentation

## 2023-12-28 DIAGNOSIS — D693 Immune thrombocytopenic purpura: Secondary | ICD-10-CM | POA: Diagnosis not present

## 2023-12-28 DIAGNOSIS — Z79899 Other long term (current) drug therapy: Secondary | ICD-10-CM | POA: Diagnosis not present

## 2023-12-28 DIAGNOSIS — I509 Heart failure, unspecified: Secondary | ICD-10-CM | POA: Diagnosis not present

## 2023-12-28 DIAGNOSIS — I110 Hypertensive heart disease with heart failure: Secondary | ICD-10-CM | POA: Diagnosis not present

## 2023-12-28 LAB — CBC
HCT: 33 % — ABNORMAL LOW (ref 39.0–52.0)
Hemoglobin: 10.2 g/dL — ABNORMAL LOW (ref 13.0–17.0)
MCH: 30.4 pg (ref 26.0–34.0)
MCHC: 30.9 g/dL (ref 30.0–36.0)
MCV: 98.2 fL (ref 80.0–100.0)
Platelets: 23 K/uL — CL (ref 150–400)
RBC: 3.36 MIL/uL — ABNORMAL LOW (ref 4.22–5.81)
RDW: 13.2 % (ref 11.5–15.5)
WBC: 5.2 K/uL (ref 4.0–10.5)
nRBC: 0 % (ref 0.0–0.2)

## 2023-12-28 LAB — BASIC METABOLIC PANEL WITH GFR
Anion gap: 12 (ref 5–15)
BUN: 35 mg/dL — ABNORMAL HIGH (ref 8–23)
CO2: 23 mmol/L (ref 22–32)
Calcium: 7.6 mg/dL — ABNORMAL LOW (ref 8.9–10.3)
Chloride: 96 mmol/L — ABNORMAL LOW (ref 98–111)
Creatinine, Ser: 3.84 mg/dL — ABNORMAL HIGH (ref 0.61–1.24)
GFR, Estimated: 17 mL/min — ABNORMAL LOW
Glucose, Bld: 233 mg/dL — ABNORMAL HIGH (ref 70–99)
Potassium: 4.9 mmol/L (ref 3.5–5.1)
Sodium: 132 mmol/L — ABNORMAL LOW (ref 135–145)

## 2023-12-28 LAB — CBG MONITORING, ED: Glucose-Capillary: 223 mg/dL — ABNORMAL HIGH (ref 70–99)

## 2023-12-28 LAB — BLOOD GAS, VENOUS
Acid-Base Excess: 3.1 mmol/L — ABNORMAL HIGH (ref 0.0–2.0)
Bicarbonate: 27 mmol/L (ref 20.0–28.0)
O2 Saturation: 96.7 %
Patient temperature: 37
pCO2, Ven: 38 mmHg — ABNORMAL LOW (ref 44–60)
pH, Ven: 7.46 — ABNORMAL HIGH (ref 7.25–7.43)
pO2, Ven: 73 mmHg — ABNORMAL HIGH (ref 32–45)

## 2023-12-28 MED ORDER — HYDROMORPHONE HCL 1 MG/ML IJ SOLN
1.0000 mg | Freq: Once | INTRAMUSCULAR | Status: AC
  Administered 2023-12-29: 1 mg via INTRAVENOUS
  Filled 2023-12-28: qty 1

## 2023-12-28 MED ORDER — GABAPENTIN 300 MG PO CAPS
300.0000 mg | ORAL_CAPSULE | Freq: Once | ORAL | Status: AC
  Administered 2023-12-28: 300 mg via ORAL
  Filled 2023-12-28: qty 1

## 2023-12-28 MED ORDER — OXYCODONE-ACETAMINOPHEN 5-325 MG PO TABS
1.0000 | ORAL_TABLET | Freq: Once | ORAL | Status: AC
  Administered 2023-12-28: 1 via ORAL
  Filled 2023-12-28: qty 1

## 2023-12-28 NOTE — ED Provider Notes (Incomplete)
 " [place] EMERGENCY DEPARTMENT AT [facility] Provider Note   CSN: [id number] Arrival date & time: 12/28/23  2147     Patient presents with: Leg Pain   [name] is a 63 y.o. male with past medical history significant for hypertension, peripheral vascular disease, heart failure with reduced ejection fraction, bilateral amputations, right BKA, left AKA.  Who presents concern for phantom leg pain and left AKA for around 10 days.  He rates the pain 8/.  No recent fall, injury.  Reportedly noncompliant with medications, elevated blood sugar.  He does say that he is taking his medications as prescribed but has a history of poor medication control.  {Add pertinent medical, surgical, social history, OB history to HPI:32947}  Leg Pain      Prior to Admission medications  Medication Sig Start Date End Date Taking? Authorizing Provider  Accu-Chek Softclix Lancets lancets Use to check blood sugar 3 (three) times daily. 12/22/22   Gabino Boga, MD  amLODipine  (NORVASC ) 5 MG tablet Take 1 tablet (5 mg total) by mouth daily. 11/09/23   Elicia Sharper, DO  aspirin  EC 81 MG tablet Take 1 tablet (81 mg total) by mouth daily. Swallow whole. Patient not taking: Reported on 10/03/2023 09/20/23   Jolaine Pac, DO  Blood Glucose Monitoring Suppl (ACCU-CHEK GUIDE) w/Device KIT Use to check your blood sugar three times daily. 12/23/22   Gabino Boga, MD  carvedilol  (COREG ) 6.25 MG tablet Take 1 tablet (6.25 mg total) by mouth 2 (two) times daily with meals for hypertension 10/04/23   Jolaine Pac, DO  Continuous Glucose Receiver (DEXCOM G7 RECEIVER) DEVI Use to measure blood sugar continuously. 11/09/23   Zheng, Michael, DO  Continuous Glucose Sensor (DEXCOM G7 SENSOR) MISC Place new sensor every 10 days. Use to monitor blood sugar continuously. 11/09/23   Zheng, Michael, DO  diclofenac  Sodium (VOLTAREN ) 1 % GEL Apply 4 g topically 4 (four) times daily. 11/09/23   Zheng, Michael, DO   ezetimibe  (ZETIA ) 10 MG tablet Take 1 tablet (10 mg total) by mouth daily. 11/09/23   Zheng, Michael, DO  glucose blood (ACCU-CHEK AVIVA PLUS) test strip Check blood sugar 3 times a day 12/22/22   Gabino Boga, MD  insulin  glargine (LANTUS  SOLOSTAR) 100 UNIT/ML Solostar Pen Inject 15 Units into the skin at bedtime. 05/31/23   Stephanie Freund, MD  Insulin  Pen Needle 32G X 4 MM MISC Use to inject insulin  4 (four) times daily. 12/22/22   Gabino Boga, MD  melatonin (KP MELATONIN) 3 MG TABS tablet Take 1 tablet (3 mg total) by mouth at bedtime for insomnia. 09/26/23   Jolaine Pac, DO  ofloxacin  (OCUFLOX ) 0.3 % ophthalmic solution Place 1 drop into the left eye 4 (four) times daily for 1 week, then stop. 06/20/23     oxyCODONE  (ROXICODONE ) 5 MG immediate release tablet Take 1 tablet (5 mg total) by mouth every 6 (six) hours as needed for severe pain (pain score 7-10). 12/15/23   Yolande Lamar BROCKS, MD  prednisoLONE  acetate (PRED FORTE ) 1 % ophthalmic suspension Instill 1 drop into left eye 4 times a day for 1 week, then 2  times a day for 1 week, then once daily for 2 weeks then STOP 06/20/23     pregabalin  (LYRICA ) 25 MG capsule Take 1 capsule (25 mg total) by mouth 2 (two) times daily. 12/15/23 01/20/24  Yolande Lamar BROCKS, MD  rosuvastatin  (CRESTOR ) 10 MG tablet Take 1 tablet (10 mg total) by mouth daily. 09/26/23  Jolaine Pac, DO    Allergies: Benicar  [olmesartan ]    Review of Systems  All other systems reviewed and are negative.   Updated Vital Signs BP (!) 157/73 (BP Location: Right Arm)   Pulse 87   Temp 99.6 F (37.6 C) (Oral)   Resp 18   Ht 4' 2 (1.27 m)   Wt 79.8 kg   SpO2 98%   BMI 49.50 kg/m   Physical Exam Vitals and nursing note reviewed.  Constitutional:      General: He is not in acute distress.    Appearance: Normal appearance.  HENT:     Head: Normocephalic and atraumatic.  Eyes:     General:        Right eye: No discharge.        Left eye: No  discharge.  Cardiovascular:     Rate and Rhythm: Normal rate and regular rhythm.     Heart sounds: No murmur heard.    No friction rub. No gallop.  Pulmonary:     Effort: Pulmonary effort is normal.     Breath sounds: Normal breath sounds.  Abdominal:     General: Bowel sounds are normal.     Palpations: Abdomen is soft.  Musculoskeletal:     Comments: Normal appearance of bilateral amputations, right BKA, left AKA, stumps are with no sign of dehiscence, cellulitis, or other sign of infection  Skin:    General: Skin is warm and dry.     Capillary Refill: Capillary refill takes less than 2 seconds.  Neurological:     Mental Status: He is alert and oriented to person, place, and time.  Psychiatric:        Mood and Affect: Mood normal.        Behavior: Behavior normal.     (all labs ordered are listed, but only abnormal results are displayed) Labs Reviewed  CBC  BASIC METABOLIC PANEL WITH GFR  CBG MONITORING, ED    EKG: None  Radiology: No results found.  {Document cardiac monitor, telemetry assessment procedure when appropriate:32947} Procedures   Medications Ordered in the ED  oxyCODONE -acetaminophen  (PERCOCET/ROXICET) 5-325 MG per tablet 1 tablet (has no administration in time range)  gabapentin  (NEURONTIN ) capsule 300 mg (has no administration in time range)      {Click here for ABCD2, HEART and other calculators REFRESH Note before signing:1}                              Medical Decision Making Amount and/or Complexity of Data Reviewed Labs: ordered.  Risk Prescription drug management.   This patient is a 63 y.o. male  who presents to the ED for concern of leg pain, hyperglycemia.   Differential diagnoses prior to evaluation: The emergent differential diagnosis includes, but is not limited to, phantom pain, DKA, HHS,. This is not an exhaustive differential.   Past Medical History / Co-morbidities / Social History:  hypertension, peripheral vascular  disease, heart failure with reduced ejection fraction, bilateral amputations, right BKA, left AKA  Physical Exam: Physical exam performed. The pertinent findings include: Vital signs stable blood pressure 137/73.  Mildly elevated temperature 99.6, unclear etiology.  No evidence of infection or infectious symptoms reported by patient.  Normal appearance of bilateral lower extremity amputation sites.  Lab Tests/Imaging studies: I personally interpreted labs/imaging and the pertinent results include: BMP with mild hyponatremia, sodium 132 in context of glucose 233, overall normal sodium in context of  hyperglycemia.  Chronically elevated BUN, creatinine not significantly changed from baseline.  No anion gap.  CBG 223.  VBG without acidosis.  CBC shows***.   Medications: I ordered medication including gabapentin , Percocet for pain.  I have reviewed the patients home medicines and have made adjustments as needed.   Disposition: After consideration of the diagnostic results and the patients response to treatment, I feel that *** .   ***emergency department workup does not suggest an emergent condition requiring admission or immediate intervention beyond what has been performed at this time. The plan is: ***. The patient is safe for discharge and has been instructed to return immediately for worsening symptoms, change in symptoms or any other concerns.   Final diagnoses:  None    ED Discharge Orders     None        "

## 2023-12-28 NOTE — ED Triage Notes (Signed)
 BIBA from home- pt has left leg AKA, having phantom pain x 10 days. Pt also has BKA on right leg. 301 cbg, pt non compliant with meds 130/86 bp 82 hr 98% r/a

## 2023-12-28 NOTE — ED Provider Notes (Signed)
 " [place] EMERGENCY DEPARTMENT AT [facility] Provider Note   CSN: [id number] Arrival date & time: 12/28/23  2147     Patient presents with: Leg Pain   [name] is a 63 y.o. male with past medical history significant for hypertension, peripheral vascular disease, heart failure with reduced ejection fraction, bilateral amputations, right BKA, left AKA.  Who presents concern for phantom leg pain and left AKA for around 10 days.  He rates the pain 8/.  No recent fall, injury.  Reportedly noncompliant with medications, elevated blood sugar.  He does say that he is taking his medications as prescribed but has a history of poor medication control.    Leg Pain      Prior to Admission medications  Medication Sig Start Date End Date Taking? Authorizing Provider  Accu-Chek Softclix Lancets lancets Use to check blood sugar 3 (three) times daily. 12/22/22   Gabino Boga, MD  amLODipine  (NORVASC ) 5 MG tablet Take 1 tablet (5 mg total) by mouth daily. 11/09/23   Zheng, Michael, DO  aspirin  EC 81 MG tablet Take 1 tablet (81 mg total) by mouth daily. Swallow whole. Patient not taking: Reported on 10/03/2023 09/20/23   Jolaine Pac, DO  Blood Glucose Monitoring Suppl (ACCU-CHEK GUIDE) w/Device KIT Use to check your blood sugar three times daily. 12/23/22   Gabino Boga, MD  carvedilol  (COREG ) 6.25 MG tablet Take 1 tablet (6.25 mg total) by mouth 2 (two) times daily with meals for hypertension 10/04/23   Jolaine Pac, DO  Continuous Glucose Receiver (DEXCOM G7 RECEIVER) DEVI Use to measure blood sugar continuously. 11/09/23   Zheng, Michael, DO  Continuous Glucose Sensor (DEXCOM G7 SENSOR) MISC Place new sensor every 10 days. Use to monitor blood sugar continuously. 11/09/23   Zheng, Michael, DO  dexamethasone  (DECADRON ) 4 MG tablet Take 10 tablets (40 mg total) by mouth daily. 12/29/23  Yes Maiya Kates H, PA-C  diclofenac  Sodium (VOLTAREN ) 1 % GEL Apply 4 g topically  4 (four) times daily. 11/09/23   Zheng, Michael, DO  ezetimibe  (ZETIA ) 10 MG tablet Take 1 tablet (10 mg total) by mouth daily. 11/09/23   Zheng, Michael, DO  glucose blood (ACCU-CHEK AVIVA PLUS) test strip Check blood sugar 3 times a day 12/22/22   Gabino Boga, MD  insulin  glargine (LANTUS  SOLOSTAR) 100 UNIT/ML Solostar Pen Inject 15 Units into the skin at bedtime. 05/31/23   Stephanie Freund, MD  Insulin  Pen Needle 32G X 4 MM MISC Use to inject insulin  4 (four) times daily. 12/22/22   Gabino Boga, MD  melatonin (KP MELATONIN) 3 MG TABS tablet Take 1 tablet (3 mg total) by mouth at bedtime for insomnia. 09/26/23   Jolaine Pac, DO  ofloxacin  (OCUFLOX ) 0.3 % ophthalmic solution Place 1 drop into the left eye 4 (four) times daily for 1 week, then stop. 06/20/23     oxyCODONE  (ROXICODONE ) 5 MG immediate release tablet Take 1 tablet (5 mg total) by mouth every 6 (six) hours as needed for severe pain (pain score 7-10). 12/15/23   Yolande Lamar BROCKS, MD  oxyCODONE -acetaminophen  (PERCOCET/ROXICET) 5-325 MG tablet Take 1 tablet by mouth every 6 (six) hours as needed for severe pain (pain score 7-10). 12/29/23  Yes Markeise Mathews H, PA-C  prednisoLONE  acetate (PRED FORTE ) 1 % ophthalmic suspension Instill 1 drop into left eye 4 times a day for 1 week, then 2  times a day for 1 week, then once daily for 2 weeks then STOP 06/20/23  pregabalin  (LYRICA ) 25 MG capsule Take 1 capsule (25 mg total) by mouth 2 (two) times daily. 12/15/23 01/20/24  Yolande Lamar BROCKS, MD  rosuvastatin  (CRESTOR ) 10 MG tablet Take 1 tablet (10 mg total) by mouth daily. 09/26/23   Jolaine Pac, DO    Allergies: Benicar  [olmesartan ]    Review of Systems  All other systems reviewed and are negative.   Updated Vital Signs BP (!) 157/73 (BP Location: Right Arm)   Pulse 87   Temp 99.6 F (37.6 C) (Oral)   Resp 18   Ht 4' 2 (1.27 m)   Wt 79.8 kg   SpO2 98%   BMI 49.50 kg/m   Physical Exam Vitals and nursing  note reviewed.  Constitutional:      General: He is not in acute distress.    Appearance: Normal appearance.  HENT:     Head: Normocephalic and atraumatic.  Eyes:     General:        Right eye: No discharge.        Left eye: No discharge.  Cardiovascular:     Rate and Rhythm: Normal rate and regular rhythm.     Heart sounds: No murmur heard.    No friction rub. No gallop.  Pulmonary:     Effort: Pulmonary effort is normal.     Breath sounds: Normal breath sounds.  Abdominal:     General: Bowel sounds are normal.     Palpations: Abdomen is soft.  Musculoskeletal:     Comments: Normal appearance of bilateral amputations, right BKA, left AKA, stumps are with no sign of dehiscence, cellulitis, or other sign of infection  Skin:    General: Skin is warm and dry.     Capillary Refill: Capillary refill takes less than 2 seconds.  Neurological:     Mental Status: He is alert and oriented to person, place, and time.  Psychiatric:        Mood and Affect: Mood normal.        Behavior: Behavior normal.     (all labs ordered are listed, but only abnormal results are displayed) Labs Reviewed  CBC - Abnormal; Notable for the following components:      Result Value   RBC 3.36 (*)    Hemoglobin 10.2 (*)    HCT 33.0 (*)    Platelets 23 (*)    All other components within normal limits  BASIC METABOLIC PANEL WITH GFR - Abnormal; Notable for the following components:   Sodium 132 (*)    Chloride 96 (*)    Glucose, Bld 233 (*)    BUN 35 (*)    Creatinine, Ser 3.84 (*)    Calcium  7.6 (*)    GFR, Estimated 17 (*)    All other components within normal limits  BLOOD GAS, VENOUS - Abnormal; Notable for the following components:   pH, Ven 7.46 (*)    pCO2, Ven 38 (*)    pO2, Ven 73 (*)    Acid-Base Excess 3.1 (*)    All other components within normal limits  APTT - Abnormal; Notable for the following components:   aPTT 39 (*)    All other components within normal limits  CBG  MONITORING, ED - Abnormal; Notable for the following components:   Glucose-Capillary 223 (*)    All other components within normal limits  PROTIME-INR  FIBRINOGEN   BETA-HYDROXYBUTYRIC ACID    EKG: None  Radiology: No results found.   Procedures   Medications Ordered in  the ED  dexamethasone  (DECADRON ) tablet 40 mg (has no administration in time range)  oxyCODONE -acetaminophen  (PERCOCET/ROXICET) 5-325 MG per tablet 1 tablet (1 tablet Oral Given 12/28/23 2229)  gabapentin  (NEURONTIN ) capsule 300 mg (300 mg Oral Given 12/28/23 2230)  HYDROmorphone  (DILAUDID ) injection 1 mg (1 mg Intravenous Given 12/29/23 0003)    Clinical Course as of 12/29/23 0136  Thu Dec 29, 2023  0121 Decadron  40mg  x 4 days -- close outpatient follow up [CP]    Clinical Course User Index [CP] Rosan Sherlean DEL, PA-C                                 Medical Decision Making Amount and/or Complexity of Data Reviewed Labs: ordered.  Risk Prescription drug management.   This patient is a 63 y.o. male  who presents to the ED for concern of leg pain, hyperglycemia.   Differential diagnoses prior to evaluation: The emergent differential diagnosis includes, but is not limited to, phantom pain, DKA, HHS,. This is not an exhaustive differential.   Past Medical History / Co-morbidities / Social History:  hypertension, peripheral vascular disease, heart failure with reduced ejection fraction, bilateral amputations, right BKA, left AKA  Physical Exam: Physical exam performed. The pertinent findings include: Vital signs stable blood pressure 137/73.  Mildly elevated temperature 99.6, unclear etiology.  No evidence of infection or infectious symptoms reported by patient.  Normal appearance of bilateral lower extremity amputation sites.  Lab Tests/Imaging studies: I personally interpreted labs/imaging and the pertinent results include: BMP with mild hyponatremia, sodium 132 in context of glucose 233, overall  normal sodium in context of hyperglycemia.  Chronically elevated BUN, creatinine not significantly changed from baseline.  No anion gap.  CBG 223.  VBG without acidosis.  CBC shows fairly profound thrombocytopenia which is new, platelets 23 from normal platelet function at last check, although this was 7 months ago.  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  aPTT is very mildly elevated at 39, but normal fibrinogen , normal PT/INR.   Medications: I ordered medication including gabapentin , Percocet for pain.  I have reviewed the patients home medicines and have made adjustments as needed.   Consults: I spoke with the hematologist, Dr. Autumn who does recommend treatment for acute ITP with high-dose steroids, urgent hematology follow-up.  Will plan to discharge patient with 4 days of high-dose Decadron , and ambulatory referral to hematology.  He is not having any active bleeding, and his hemoglobin is overall stable at this time.  Disposition: After consideration of the diagnostic results and the patients response to treatment, I feel that patient is stable for discharge with plan as above.   emergency department workup does not suggest an emergent condition requiring admission or immediate intervention beyond what has been performed at this time. The plan is: as above. The patient is safe for discharge and has been instructed to return immediately for worsening symptoms, change in symptoms or any other concerns.   Final diagnoses:  Left leg pain  Acute ITP [facility])    ED Discharge Orders          Ordered    Ambulatory referral to Hematology / Oncology       Comments: Acute onset ITP   12/29/23 0135    oxyCODONE -acetaminophen  (PERCOCET/ROXICET) 5-325 MG tablet  Every 6 hours PRN        12/29/23 0135    dexamethasone  (DECADRON ) 4 MG tablet  Daily  12/29/23 0135               Rosan Sherlean DEL, PA-C 12/29/23 0136    Rogelia Jerilynn RAMAN, MD 01/01/24 1649  "

## 2023-12-29 DIAGNOSIS — M79605 Pain in left leg: Secondary | ICD-10-CM | POA: Diagnosis not present

## 2023-12-29 LAB — APTT: aPTT: 39 s — ABNORMAL HIGH (ref 24–36)

## 2023-12-29 LAB — BETA-HYDROXYBUTYRIC ACID: Beta-Hydroxybutyric Acid: <0.05 mmol/L — ABNORMAL LOW (ref 0.05–0.27)

## 2023-12-29 LAB — FIBRINOGEN: Fibrinogen: 417 mg/dL (ref 210–475)

## 2023-12-29 LAB — PROTIME-INR
INR: 1 (ref 0.8–1.2)
Prothrombin Time: 14 s (ref 11.4–15.2)

## 2023-12-29 MED ORDER — OXYCODONE-ACETAMINOPHEN 5-325 MG PO TABS
1.0000 | ORAL_TABLET | Freq: Four times a day (QID) | ORAL | 0 refills | Status: AC | PRN
  Filled 2023-12-29: qty 15, 4d supply, fill #0

## 2023-12-29 MED ORDER — DEXAMETHASONE 4 MG PO TABS
40.0000 mg | ORAL_TABLET | Freq: Once | ORAL | Status: AC
  Administered 2023-12-29: 40 mg via ORAL
  Filled 2023-12-29: qty 10

## 2023-12-29 MED ORDER — DEXAMETHASONE 4 MG PO TABS
40.0000 mg | ORAL_TABLET | Freq: Every day | ORAL | 0 refills | Status: AC
  Filled 2023-12-29: qty 40, 4d supply, fill #0

## 2023-12-29 NOTE — Discharge Instructions (Addendum)
 I suspect that your leg pain is secondary to phantom limb syndrome as we discussed.  I prescribed some pain medicine that you can use in addition to ibuprofen, Tylenol  at home.  Because of your very low platelets for your next several dialysis appointments until otherwise advised by the hematologist, they should perform your dialysis without heparin .  Please take the entire course of steroids that we have prescribed.  This is a high dose of steroids that will help to reverse the significant decrease in your platelets and prevent any bleeding.  I have placed a referral to the hematologist to schedule a close follow-up appointment.  If you have not heard from the hematologist by the end of the week please call the number that I have placed above to ensure close follow-up.  If you begin to have severe bleeding prior to being able to follow-up with a hematologist please return to the emergency department for further evaluation.

## 2023-12-30 ENCOUNTER — Other Ambulatory Visit (HOSPITAL_COMMUNITY): Payer: Self-pay

## 2024-01-03 ENCOUNTER — Telehealth: Payer: Self-pay | Admitting: Oncology

## 2024-01-03 NOTE — Telephone Encounter (Signed)
 Left detailed message on PT voicemail.

## 2024-01-09 ENCOUNTER — Other Ambulatory Visit: Payer: Self-pay

## 2024-01-09 ENCOUNTER — Inpatient Hospital Stay: Admitting: Oncology

## 2024-01-09 ENCOUNTER — Telehealth: Payer: Self-pay

## 2024-01-09 ENCOUNTER — Inpatient Hospital Stay

## 2024-01-09 NOTE — Telephone Encounter (Signed)
 Followed up with patient regarding his missed New Patient Hematology appointment with Dr. Autumn as well as a lab appointment. Left a voicemail message.

## 2024-01-10 ENCOUNTER — Telehealth: Payer: Self-pay | Admitting: Oncology

## 2024-01-10 NOTE — Telephone Encounter (Signed)
 Spoke with sister to get pt scheduled.

## 2024-01-10 NOTE — Telephone Encounter (Signed)
 Called Pt to schedule NEW HEM Appt; phone is only ringing twice and going silent.

## 2024-01-11 ENCOUNTER — Ambulatory Visit: Attending: Internal Medicine

## 2024-01-11 ENCOUNTER — Other Ambulatory Visit: Payer: Self-pay | Admitting: Oncology

## 2024-01-11 DIAGNOSIS — D696 Thrombocytopenia, unspecified: Secondary | ICD-10-CM

## 2024-01-11 DIAGNOSIS — R2689 Other abnormalities of gait and mobility: Secondary | ICD-10-CM | POA: Insufficient documentation

## 2024-01-11 DIAGNOSIS — M6281 Muscle weakness (generalized): Secondary | ICD-10-CM | POA: Insufficient documentation

## 2024-01-11 NOTE — Therapy (Signed)
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 self      propel standard manual wheelchair in the residence  []  can and does self-propel (marginal propulsion skills)  []  daily use _________hours  []  chair fits throughout home  []  willing and motivated to use  []  lower seat height required to foot propel  []  short stature   []  High strength lightweight manual  wheelchair (Breezy Ultra 4)  K0004     Arm:    []  both []  right  []  left     Foot:   []  both []  right   []  left                                                                  []  hemi height required []  medical condition and weight of wheelchair affect ability to self propel while engaging in frequent MRADL(s) that cannot be performed in a standard or lightweight manual wheelchair  []  daily use _________hours  []  chair fits throughout home  []  willing and motivated to use  []  prevent repetitive use injuries   []  lower seat height required to foot propel  []  short stature    [x]  Ultra-lightweight manual wheelchair  K0005     Arm:    [x]  both []  right  []  left     Foot:   [x]  both []  right  []  left       []  hemi height required  []   heavy duty    Front seat to floor __17.5___ inches      Rear seat to floor ___16__ inches      Back height __19.5___ inches     Back angle ______ degrees      Front angle _____ degrees  [x]   full-time manual wheelchair user  [x]  Requires individualized fitting and  optimal adjustments for multiple features that include adjustable axle configuration, fully adjustable center of gravity, wheel camber, seat and back angle, angle of seat slope, which cannot be accommodated by a K0001 through K0004 manual wheelchair  [x]  prevent repetitive use injuries  [x]  daily use______12+___hours   []  user has high activity patterns that frequently require  them  to go out into the community for the purpose of independently accomplishing high level MRADL activities. Examples of these might include a combination of; shopping, work, school, photographer, childcare, independently loading and unloading from a vehicle etc.  [x]  lower seat height required to foot propel  []  short stature  []  heavy duty -  weight over 250lbs   []  Current chair is a K0005   manufacture:___________________  model:_________________  serial#____________________  age:_________    [x]  First time [id number] user (complete trial)  K0004 time and # of strokes to propel 30 feet: ________seconds _________strokes  X9994 time and # of strokes to propel 30 feet: ________seconds _________strokes  What was the result of the trial between the K0004 and K0005 manual wheelchair? ___    What features of the K0005 w/c are needed as compared to the K0004 base? Why?___Patient needs ultralight wheelchair to allow patient to propel the wheelchair independently considering pain in his hand    [x]  adjustable seat and back angle changes the angle of seat slope of the frame to attain a gravity assisted position for efficient propulsion and proper weight distribution along the frame     [x]  the front of the wheelchair will be configured higher than the back of the chair to allow gravity to assist the user with postural stability  [x]  the center of the wheel will be positioned for stability, safety and efficient propulsion  [x]  adjustable axle allows for vertical, horizontal, camber and overall width changes  throughout the wheels  for adjustment of the client's exact needs and abilities.   [x]  adjustable axle increases the stability and function of the chair allowing for adjustment of the center of gravity.   [x]  accommodates the client's anatomical position in the chair maximizing independence in mobility and maneuverability in all environments.   [x]  create a minimal fixed tilt-in space to assist in positioning.   [x]  Describe users full-time manual wheelchair activity patterns:___    []  Power assist Comments:  []  prevent repetitive use injuries  []  repetitive strain injury present in    shoulder girdle    []  shoulder pain is (> or =) to 7/10     during manual propulsion       Current Pain _____/10  []  requires conservation of energy to participate in MRADL(s) runable to propel up ramps or curbs using manual wheelchair  []  been K0005 user greater than one year  []  user unwilling to use power      wheelchair (reason): []  less expensive option to power   wheelchair   []  rim activated power assist -      decreased strength   []  Heavy duty manual wheelchair       K0006     Arm:    []  both []   right  []  left     Foot:   []  both []  right  []  left     []  hemi height required    []  Dependent base  []  user exceeds 250lbs  []  non-functional ambulator    []  extreme spasticity  []  over active movement   []  broken frame/hx of repeated     repairs  []  able to self-propel in residence       []  lower seat to floor height required  []  unable to self-propel in residence   []  Extra heavy duty manual wheelchair  K0007     Arm:    []  both []  right  []  left     Foot:   []  both []  right  []  left     []  hemi height required  []  Dependent base  []  user exceeds 300lbs  []  non-functional ambulator    []  able to self-propel in residence   []  lower seat to floor height required  []  unable to self-propel in residence     []  Manual wheelchair with tilt [id number]      (Manual Tilt-n-Space)  []  patient is dependent for transfers  []   patient requires frequent       positioning for pressure relief   []  patient requires frequent      positioning for poor/absent trunk control        []  Stroller Base  []  infant/child   []  unable to propel manual      wheelchair  []  allows for growth  []  non-functional ambulator  []  non-functional UE  []  independent mobility is not a goal at this time    MANUAL FRAME OPTIONS      Push handles  []  extended   []  angle adjustable   [x]  standard  []  caregiver access  []  caregiver assist    []  allows hooking to enable      increased ability to perform ADLs or maintain balance   []  Angle Adjustable Back  []  postural control  []  control of tone/spasticity  []  accommodation of range of motion  []  UE functional control  []  accommodation for seating system    Rear wheel placement  []  std/fixed  [x] fully adjustable  [x]  camber ________degree  [x]  removable rear wheel  []  non-removable rear wheel  Wheel size __24_____  Wheel style_______________________  [x]  improved UE access to wheels  [x]  increase propulsion ability  [x]  improved stability  [x]  changing angle in space for      improvement of postural stability  [x]  remove for transport    [x]  allow for seating system to fit on  base  []  amputee placement  []  1-arm drive access   r R  r L  []  enable propulsion of manual       wheelchair with one arm    []  amputee placement   Wheel rims/ Hand rims  [x]  Standard    []  Specialized-____ [x]  provide ability to propel manual   [x]  increase self-propulsion with hand wheelchair weakness/decreased grasp     []  Spoke protector/guard   []  prevent hands from getting caught in spokes   Tires:  []  pneumatic  []  flat free inserts  []  solid  Style: full poly [x]  decrease roll resistance              [x]  prevent frequent flats  []  increase shock absorbency  [x]  decrease maintenance   []  decrease pain from road shock    []  decrease spasms from  road shock    Wheel Locks:    [x]  push []  pull []   scissor  [x]  lock wheels for transfers  [x]  lock wheels from rolling   Brake/wheel lock extension:  []  R  []  L  []  allow user to operate wheel locks due to decreased reach or strength   Caster housing:  Caster size:  5 x 1.25                    Style:      Best fit, soft roll                                   []  suspension fork  [x]  maneuverability   [x]  stability of wheelchair   [x]  durability  [x]  maintenance  []  angle adjustment for posture  []  allow for feet to come under        wheelchair base  []  allows change in seat to floor  height   []  increase shock absorbency  []  decrease pain from road shock  []  decrease spasms from road    shock   []  Side guards  []  prevent clothing getting caught in wheel or becoming soiled   [] provide hip and pelvic stability  []  eliminates contact between body and wheels  []  limit hand contact with wheels   [x]  Anti-tippers      [x]  prevent wheelchair from tipping    backward  [x]  assist caregiver with curbs     POWER MOBILITY      []  Scooter/POV    []  can safely operate   []  can safely transfer   []  has adequate trunk stability   []  cannot functionally propel  manual wheelchair    []  Power mobility base    []  non-ambulatory   []  cannot functionally propel manual wheelchair   []  cannot functionally and safely      operate scooter/POV  []  can safely operate power       wheelchair  []  home is accessible  []  willing to use power wheelchair     Tilt  []  Powered tilt on powered chair  []  Powered tilt on manual chair  []  Manual tilt on manual chair Comments:  []  change position for pressure      []  elief/cannot weight shift   []  change position against      gravitational force on head and      shoulders   []  decrease pain  []  blood pressure management   []  control autonomic dysreflexia  []  decrease respiratory distress  []  management of spasticity  []  management of low tone  []  facilitate postural control   []  rest periods   []  control  edema  []  increase sitting tolerance   []  aid with transfers     Recline   []  Power recline on power chair  []  Manual recline on manual chair  Comments:    []  intermittent catheterization  []  manage spasticity  []  accommodate femur to back angle  []  change position for pressure relief/cannot weight shift rhigh risk of pressure sore development  []  tilt alone does not accomplish     effective pressure relief, maximum pressure relief achieved at -      _______ degrees tilt   _______ degrees recline   []  difficult to transfer to and from bed []  rest periods and sleeping in chair  []  repositioning for transfers  []   bring to full recline for ADL care  []  clothing/diaper changes in chair  []  gravity PEG tube feeding  []  head positioning  []  decrease pain  []  blood pressure management   []  control autonomic dysreflexia  []  decrease respiratory distress  []  user on ventilator     Elevator on mobility base  []  Power wheelchair  []  Scooter  []  increase Indep in transfers   []  increase Indep in ADLs    []  bathroom function and safety  []  kitchen/cooking function and safety  []  shopping  []  raise height for communication at standing level  []  raise height for eye contact which reduces cervical neck strain and pain  []  drive at raised height for safety and navigating crowds  []  Other:   []  Vertical position system  (anterior tilt)     (Drive locks-out)    []  Stand       (Drive enabled)  []  independent weight bearing  []  decrease joint contractures  []  decrease/manage spasticity  []  decrease/manage spasms  []  pressure distribution away from   scapula, sacrum, coccyx, and ischial tuberosity  []  increase digestion and elimination   []  access to counters and cabinets  []  increase reach  []  increase interaction with others at eye level, reduces neck strain  []  increase performance of       MRADL(s)      Power elevating legrest    []  Center mount (Single) 85-170 degrees        []  Standard (Pair) 100-170 degrees  []  position legs at 90 degrees, not available with std power ELR  []  center mount tucks into chair to decrease turning radius in home, not available with std power ELR  []  provide change in position for LE  []  elevate legs during recline    []  maintain placement of feet on      footplate  []  decrease edema  []  improve circulation  []  actuator needed to elevate legrest  []  actuator needed to articulate legrest preventing knees from flexing  []  Increase ground clearance over      curbs  []   STD (pair) independently                     elevate legrest   POWER WHEELCHAIR CONTROLS      Controls/input device  []  Expandable  []  Non-expandable  []  Proportional  []  Right Hand []  Left Hand  []  Non-proportional/switches/head-array  []  Electrical/proximity         []   Mechanical      Manufacturer:___________________   Type:________________________ []  provides access for controlling wheelchair  []  programming for accurate control  []  progressive disease/changing condition  []  required for alternative drive      controls       []  lacks motor control to operate  proportional drive control  []  unable to understand proportional controls  []  limited movement/strength  []  extraneous movement / tremors / ataxic / spastic       []  Upgraded electronics controller/harness    []  Single power (tilt or recline)   []  Expandable    []  Non-expandable plus   []  Multi-power (tilt, recline, power legrest, power seat lift, vertical positioning system, stand)  []  allows input device to communicate with drive motors  []  harness provides necessary connections between the controller, input device, and seat functions     []  needed in order to operate power seat functions through joystick/ input device  []  required for alternative drive controls     []   Enhanced display  []  required to connect all alternative drive controls   []  required for upgraded joystick      (lite-throw,  heavy duty, micro)  []  Allows user to see in which mode and drive the wheelchair is set; necessary for alternate controls       []  Upgraded tracking electronics  []  correct tracking when on uneven surfaces makes switch driving more efficient and less fatiguing  []  increase safety when driving  []  increase ability to traverse thresholds    []  Safety / reset / mode switches     Type:    []  Used to change modes and stop the wheelchair when driving     []  Mount for joystick / input device/switches  []  swing away for access or transfers   []  attaches joystick / input device / switches to wheelchair   []  provides for consistent access  []  midline for optimal placement    []  Attendant controlled joystick plus     mount  []  safety  []  long distance driving  []  operation of seat functions  []  compliance with transportation regulations    []  Battery  []  required to power (power assist / scooter/ power wc / other):   []  Power inverter (24V to 12V)  []  required for ventilator / respiratory equipment / other:     CHAIR OPTIONS MANUAL & POWER      Armrests   [x]  adjustable height []  removable  []  swing away []  fixed  [x]  flip back  []  reclining  []  full length pads []  desk []  tube arms []  gel pads  [x]  provide support with elbow at 90    [x]  remove/flip back/swing away for  transfers  [x]  provide support and positioning of upper body    [x]  allow to come closer to table top  []  remove for access to tables  []  provide support for w/c tray  [x]  change of height/angles for variable activities   []  Elbow support / Elbow stop  []  keep elbow positioned on arm pad  []  keep arms from falling off arm pad  during tilt and/or recline   Upper Extremity Support  []  Arm trough  []   R  []   L  Style:  []  swivel mount []  fixed mount   []  posterior hand support  []   tray  []  full tray  []  joystick cut out  []   R  []   L  Style:  []  decrease gravitational pull on      shoulders  []  provide support to increase  UE  function  []  provide hand support in natural    position  []  position flaccid UE  []  decrease subluxation    []  decrease edema       []  manage spasticity   []  provide midline positioning  []  provide work surface  []  placement for AAC/ Computer/ EADL       Hangers/ Legrests   []  ______ degree  []  Elevating []  articulating  []  swing away []  fixed []  lift off  []  heavy duty  []  adjustable knee angle  []  adjustable calf panel   []  longer extension tube              []  provide LE support  []  maintain placement of feet on      footplate   []  accommodate lower leg length  []  accommodate to hamstring       tightness  []  enable transfers  []  provide change  in position for LE's  []  elevate legs during recline    []  decrease edema  []  durability      Foot support   [x]  footplate []  R []  L []  flip up           [x]  Depth adjustable   [x]  angle adjustable  []  foot board/one piece    [x]  provide foot support  []  accommodate to ankle ROM  []  allow foot to go under wheelchair base  []  enable transfers     []  Shoe holders  []  position foot    []  decrease / manage spasticity  []  control position of LE  []  stability    []  safety     [x]  Ankle strap/heel      loops  [x]  support foot on foot support  []  decrease extraneous movement  []  provide input to heel   []  protect foot     []  Amputee adapter []  R  []  L     Style:                  Size:  []  Provide support for stump/residual extremity    []  Transportation tie-down  []  to provide crash tested tie-down brackets    []  Crutch/cane holder    []  O2 holder    []  IV hanger   []  Ventilator tray/mount    []  stabilize accessory on wheelchair       Component  Justification     [x]  Seat cushion     [employer] SP- [employer] protection and positioning   []  accommodate impaired sensation  [x]  decubitus ulcers present or history  [x]  unable to shift weight  [x]  increase pressure distribution  []  prevent pelvic extension  [x]  custom required  off-the-shelf    seat cushion will not accommodate deformity  []  stabilize/promote pelvis alignment  []  stabilize/promote femur alignment  []  accommodate obliquity  []  accommodate multiple deformity  []  incontinent/accidents  [x]  low maintenance     []  seat mounts                 []  fixed []  removable  []  attach seat platform/cushion to wheelchair frame    []  Seat wedge    []  provide increased aggressiveness of seat shape to decrease sliding  down in the seat  []  accommodate ROM        []  Cover replacement   []  protect back or seat cushion  []  incontinent/accidents    []  Solid seat / insert    []  support cushion to prevent      hammocking  []  allows attachment of cushion to mobility base    []  Lateral pelvic/thigh/hip     support (Guides)     []  decrease abduction  []  accommodate pelvis  []  position upper legs  []  accommodate spasticity  []  removable for transfers     []  Lateral pelvic/thigh      supports mounts  []  fixed   []  swing-away   []  removable  []  mounts lateral pelvic/thigh supports     []  mounts lateral pelvic/thigh supports swing-away or removable for transfers    []  Medial thigh support (Pommel)  [] decrease adduction  [] accommodate ROM  []  remove for transfers   []  alignment      []  Medial thigh   []  fixed      support mounts      []  swing-away   []  removable  []  mounts medial thigh supports   []  Mounts medial  supports swing- away or removable for transfers       Component  Justification   [x]  Back    Tension adjustable   [x]  provide posterior trunk support []  facilitate tone  [x]  provide lumbar/sacral support []  accommodate deformity  [x]  support trunk in midline   []  custom required off-the-shelf back support will not accommodate deformity   []  provide lateral trunk support []  accommodate or decrease tone            []  Back mounts  []  fixed  []  removable  []  attach back rest/cushion to wheelchair frame   []  Lateral trunk      supports  []  R []  L  []   decrease lateral trunk leaning  []  accommodate asymmetry    []  contour for increased contact  []  safety    []  control of tone    []  Lateral trunk      supports mounts  []  fixed  []  swing-away   []  removable  []  mounts lateral trunk supports     []  Mounts lateral trunk supports swing-away or removable for transfers   []  Anterior chest      strap, vest     []  decrease forward movement of shoulder  []  decrease forward movement of trunk  []  safety/stability  []  added abdominal support  []  trunk alignment  []  assistance with shoulder control   []  decrease shoulder elevation    []  Headrest      []  provide posterior head support  []  provide posterior neck support  []  provide lateral head support  []  provide anterior head support  []  support during tilt and recline  []  improve feeding     []  improve respiration  []  placement of switches  []  safety    []  accommodate ROM   []  accommodate tone  []  improve visual orientation   []  Headrest           []  fixed []  removable []  flip down      Mounting hardware   []  swing-away laterals/switches  []  mount headrest   []  mounts headrest flip down or  removable for transfers  []  mount headrest swing-away laterals   []  mount switches     []  Neck Support    []  decrease neck rotation  []  decrease forward neck flexion   Pelvic Positioner    []  std hip belt          []  padded hip belt  []  dual pull hip belt  []  four point hip belt  []  stabilize tone  []  decrease falling out of chair  []  prevent excessive extension  []  special pull angle to control      rotation  []  pad for protection over boney   prominence  []  promote comfort    []  Essential needs        bag/pouch   []  medicines []  special food rorthotics []  clothing changes  []  diapers  []  catheter/hygiene []  ostomy supplies   The above equipment has a life- long use expectancy.  Growth and changes in medical and/or functional conditions would be the exceptions.   SUMMARY:     ASSESSMENT:  CLINICAL IMPRESSION: The patient presents today for a comprehensive wheelchair evaluation accompanied by his sister. He resides alone and receives caregiver assistance for 4 hours per day, 7 days a week. 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 He also has a documented history of pressure ulcers. At present, the patient uses a manual wheelchair as his primary means of mobility within his home. His left prosthesis no longer fits adequately, limiting safe use of prosthetic ambulation and making the wheelchair his medically necessary primary mobility device for all ADLs, IADLs, and safe, independent movement throughout his residence and community.  K1-K4 wheelchairs are not appropriate because they are heavier, less adjustable, and not designed for full-time, long-term self-propulsion. They lack the necessary configurability for safe and efficient mobility for individuals with complex needs, such as amputations, postural asymmetries, or significant medical comorbidities. These models cannot adequately adjust the center of gravity, axle position, or seating system required to maximize propulsion efficiency and reduce the risk of overuse injuries. Therefore, they do not meet the clinical needs of a patient who relies on a manual wheelchair as their primary means of mobility.  Justification for K5 Ultralightweight Manual Wheelchair A K5 ultralightweight manual wheelchair is medically necessary due to the patients bilateral lower-extremity amputations, decreased cardiopulmonary endurance, and the need for efficient self-propulsion as  his main mode of mobility. A standard or lightweight K3/K4 wheelchair would not meet his functional needs due to excessive weight, reduced adjustability, and increased propulsion effort, which would place undue strain on his upper extremities and exacerbate risks of overuse injury. Given his ESRD on dialysis, CAD/HFrEF, and limited endurance, he requires a wheelchair with adjustable axle position, optimized center of gravity, and reduced overall weight to allow safe and sustainable self-propulsion. The K5 configuration promotes proper alignment, reduces rolling resistance, and maximizes independence. No lesser wheelchair category can provide the adjustability required to optimize his posture and propulsion mechanics given his asymmetric limb lengths, altered pelvic loading, and history of ulceration.  Justification for Axiom SP Visco Custom Seat Cushion The [employer] cushion is medically necessary to maintain the patients pelvic alignment, provide optimal pressure distribution, and reduce the high risk of pressure injury associated with his multiple comorbidities including diabetes, ESRD, vascular disease, and a history of pressure ulcers. His bilateral amputations result in significant changes to weight distribution, placing elevated pressure on his pelvis and remaining soft tissue. Viscoelastic foam with specialized contouring is required to offload bony prominences, maintain stability, and promote skin protection, which cannot be achieved using standard or general-use cushions. The Axiom SP Viscos adjustable and contoured design supports the patients residual limb positioning, corrects pelvic obliquity, and enhances postural control, which is essential for safe propulsion and daily functional tasks performed from the wheelchair.   OBJECTIVE IMPAIRMENTS Abnormal gait, decreased activity tolerance, decreased balance, decreased endurance, decreased mobility, difficulty walking, decreased ROM, decreased  strength, hypomobility, increased edema, increased muscle spasms, impaired flexibility, impaired tone, impaired UE functional use, postural dysfunction, prosthetic dependency , and pain.   ACTIVITY LIMITATIONS carrying, bending, standing, squatting, stairs, transfers, bed mobility, continence, bathing, toileting, dressing, reach over head, and hygiene/grooming  PARTICIPATION LIMITATIONS: meal prep, cleaning, laundry, driving, shopping, and community activity  PERSONAL FACTORS Age, Time since onset of injury/illness/exacerbation, and 3+ comorbidities: prosthetic dependancy, HTN, DM, ESRD are also affecting patient's functional outcome.   REHAB POTENTIAL: Good  CLINICAL DECISION MAKING: Stable/uncomplicated  EVALUATION COMPLEXITY: High  GOALS: One time visit. No goals established.    PLAN: PT FREQUENCY: one time visit    Raj LOISE Blanch, PT 01/11/2024, 11:16 AM    I concur with the above findings and recommendations of the therapist:  Physician name printed:         Physician's signature:      Date:      "

## 2024-01-16 ENCOUNTER — Inpatient Hospital Stay

## 2024-01-16 ENCOUNTER — Telehealth: Payer: Self-pay

## 2024-01-16 ENCOUNTER — Inpatient Hospital Stay: Admitting: Oncology

## 2024-01-16 NOTE — Telephone Encounter (Signed)
 Followed up with patient regarding his missed Hematology appointment today at [facility] with Dr. Autumn. Left a voicemail letting patient know someone might reach out and reschedule his missed appointment.

## 2024-01-25 ENCOUNTER — Other Ambulatory Visit: Payer: Self-pay

## 2024-01-26 ENCOUNTER — Telehealth: Payer: Self-pay | Admitting: *Deleted

## 2024-01-26 NOTE — Telephone Encounter (Signed)
 Copied from CRM #[id number]. Topic: Clinical - Medication Question >> Dec 15, 2023 12:52 PM DeAngela L wrote: Reason for CRM: Ms [name] B. Is calling to ask can the doctor send over information to [facility] for the patient, the [facility] office informed her that if the doctor sends information about the patients needs changing and being more urgent this can help the patient move up the list and help him get assistance quicker by sending all new Updated Medical information, cause 3 or 4 hours a day with care is not helping the patient since he can't do much for himself and his wheelchair can't fit in his bathroom cause the patient doesn't have a handicap apartment.  [facility] fax num [phone]  Ms [name] FURY [phone] >> Jan 26, 2024 12:15 PM Susanna ORN wrote: [name], with [facility], is calling to get an update. States they are working with patient to get him a new wheelchair & they are requesting office visit notes from Dr. Jolaine. He states he will be also faxing the requests over to the clinic's fax number that was provided. [name] is also asking if someone could give him a call back to give him update as to when they can expect to receive this information. CB #: [id number].

## 2024-02-03 ENCOUNTER — Other Ambulatory Visit: Payer: Self-pay | Admitting: Student

## 2024-02-03 ENCOUNTER — Other Ambulatory Visit: Payer: Self-pay

## 2024-02-03 ENCOUNTER — Other Ambulatory Visit (HOSPITAL_COMMUNITY): Payer: Self-pay

## 2024-02-03 DIAGNOSIS — E1151 Type 2 diabetes mellitus with diabetic peripheral angiopathy without gangrene: Secondary | ICD-10-CM

## 2024-02-03 DIAGNOSIS — I10 Essential (primary) hypertension: Secondary | ICD-10-CM

## 2024-02-05 ENCOUNTER — Other Ambulatory Visit (HOSPITAL_COMMUNITY): Payer: Self-pay

## 2024-02-05 ENCOUNTER — Other Ambulatory Visit: Payer: Self-pay

## 2024-02-06 ENCOUNTER — Other Ambulatory Visit (HOSPITAL_COMMUNITY): Payer: Self-pay

## 2024-02-06 ENCOUNTER — Other Ambulatory Visit: Payer: Self-pay

## 2024-02-07 ENCOUNTER — Encounter: Payer: Self-pay | Admitting: Pharmacist

## 2024-02-07 ENCOUNTER — Other Ambulatory Visit: Payer: Self-pay

## 2024-02-07 MED ORDER — CARVEDILOL 6.25 MG PO TABS
6.2500 mg | ORAL_TABLET | Freq: Two times a day (BID) | ORAL | 3 refills | Status: AC
  Filled 2024-02-07: qty 60, 30d supply, fill #0

## 2024-02-07 MED ORDER — DEXCOM G7 RECEIVER DEVI
0 refills | Status: AC
  Filled 2024-02-07: qty 1, 30d supply, fill #0

## 2024-02-07 NOTE — Telephone Encounter (Signed)
 Medication sent to pharmacy

## 2024-02-08 ENCOUNTER — Other Ambulatory Visit: Payer: Self-pay

## 2024-02-09 ENCOUNTER — Other Ambulatory Visit (HOSPITAL_COMMUNITY): Payer: Self-pay

## 2024-02-09 ENCOUNTER — Other Ambulatory Visit: Payer: Self-pay

## 2024-02-10 ENCOUNTER — Other Ambulatory Visit: Payer: Self-pay

## 2024-05-02 ENCOUNTER — Ambulatory Visit
